# Patient Record
Sex: Male | Born: 1955 | Hispanic: No | Marital: Married | Smoking: Former smoker
Health system: Southern US, Community
[De-identification: ages and names within clinical notes are randomized; demographics above are authoritative.]

## PROBLEM LIST (undated history)

## (undated) DIAGNOSIS — Z992 Dependence on renal dialysis: Secondary | ICD-10-CM

## (undated) DIAGNOSIS — R0602 Shortness of breath: Secondary | ICD-10-CM

## (undated) DIAGNOSIS — K921 Melena: Secondary | ICD-10-CM

## (undated) DIAGNOSIS — N186 End stage renal disease: Secondary | ICD-10-CM

## (undated) DIAGNOSIS — A159 Respiratory tuberculosis unspecified: Secondary | ICD-10-CM

## (undated) DIAGNOSIS — R51 Headache: Secondary | ICD-10-CM

## (undated) DIAGNOSIS — I10 Essential (primary) hypertension: Secondary | ICD-10-CM

## (undated) DIAGNOSIS — J189 Pneumonia, unspecified organism: Secondary | ICD-10-CM

## (undated) DIAGNOSIS — S29012A Strain of muscle and tendon of back wall of thorax, initial encounter: Secondary | ICD-10-CM

## (undated) DIAGNOSIS — R634 Abnormal weight loss: Secondary | ICD-10-CM

## (undated) DIAGNOSIS — L84 Corns and callosities: Secondary | ICD-10-CM

## (undated) DIAGNOSIS — R319 Hematuria, unspecified: Secondary | ICD-10-CM

## (undated) DIAGNOSIS — E440 Moderate protein-calorie malnutrition: Secondary | ICD-10-CM

## (undated) DIAGNOSIS — D696 Thrombocytopenia, unspecified: Secondary | ICD-10-CM

## (undated) DIAGNOSIS — R519 Headache, unspecified: Secondary | ICD-10-CM

## (undated) DIAGNOSIS — Z9981 Dependence on supplemental oxygen: Secondary | ICD-10-CM

## (undated) DIAGNOSIS — M25511 Pain in right shoulder: Secondary | ICD-10-CM

## (undated) DIAGNOSIS — G459 Transient cerebral ischemic attack, unspecified: Secondary | ICD-10-CM

## (undated) DIAGNOSIS — I2510 Atherosclerotic heart disease of native coronary artery without angina pectoris: Secondary | ICD-10-CM

## (undated) DIAGNOSIS — N289 Disorder of kidney and ureter, unspecified: Secondary | ICD-10-CM

## (undated) DIAGNOSIS — R112 Nausea with vomiting, unspecified: Secondary | ICD-10-CM

## (undated) DIAGNOSIS — H4310 Vitreous hemorrhage, unspecified eye: Secondary | ICD-10-CM

## (undated) DIAGNOSIS — E785 Hyperlipidemia, unspecified: Secondary | ICD-10-CM

## (undated) DIAGNOSIS — K635 Polyp of colon: Secondary | ICD-10-CM

## (undated) DIAGNOSIS — D649 Anemia, unspecified: Secondary | ICD-10-CM

## (undated) DIAGNOSIS — R011 Cardiac murmur, unspecified: Secondary | ICD-10-CM

## (undated) DIAGNOSIS — R079 Chest pain, unspecified: Secondary | ICD-10-CM

## (undated) DIAGNOSIS — K219 Gastro-esophageal reflux disease without esophagitis: Secondary | ICD-10-CM

## (undated) DIAGNOSIS — R200 Anesthesia of skin: Secondary | ICD-10-CM

## (undated) DIAGNOSIS — Z972 Presence of dental prosthetic device (complete) (partial): Secondary | ICD-10-CM

## (undated) DIAGNOSIS — Z9289 Personal history of other medical treatment: Secondary | ICD-10-CM

## (undated) DIAGNOSIS — Z87442 Personal history of urinary calculi: Secondary | ICD-10-CM

## (undated) DIAGNOSIS — I5032 Chronic diastolic (congestive) heart failure: Secondary | ICD-10-CM

## (undated) DIAGNOSIS — E119 Type 2 diabetes mellitus without complications: Secondary | ICD-10-CM

## (undated) DIAGNOSIS — I209 Angina pectoris, unspecified: Secondary | ICD-10-CM

## (undated) HISTORY — DX: Chest pain, unspecified: R07.9

## (undated) HISTORY — DX: Corns and callosities: L84

## (undated) HISTORY — DX: Transient cerebral ischemic attack, unspecified: G45.9

## (undated) HISTORY — DX: Strain of muscle and tendon of back wall of thorax, initial encounter: S29.012A

## (undated) HISTORY — PX: AV FISTULA PLACEMENT: SHX1204

## (undated) HISTORY — DX: Pain in right shoulder: M25.511

## (undated) HISTORY — PX: CARDIAC CATHETERIZATION: SHX172

## (undated) HISTORY — DX: Nausea with vomiting, unspecified: R11.2

## (undated) HISTORY — DX: Moderate protein-calorie malnutrition: E44.0

## (undated) HISTORY — DX: Shortness of breath: R06.02

## (undated) HISTORY — PX: CORONARY ANGIOPLASTY WITH STENT PLACEMENT: SHX49

## (undated) HISTORY — PX: EYE SURGERY: SHX253

## (undated) HISTORY — DX: Abnormal weight loss: R63.4

## (undated) HISTORY — PX: CYSTOSCOPY W/ STONE MANIPULATION: SHX1427

## (undated) HISTORY — PX: LITHOTRIPSY: SUR834

## (undated) HISTORY — PX: MITRAL VALVE REPLACEMENT: SHX147

## (undated) HISTORY — DX: Vitreous hemorrhage, unspecified eye: H43.10

## (undated) HISTORY — DX: Thrombocytopenia, unspecified: D69.6

## (undated) HISTORY — PX: CORONARY ARTERY BYPASS GRAFT: SHX141

## (undated) HISTORY — DX: Anesthesia of skin: R20.0

---

## 2010-11-29 DIAGNOSIS — H4310 Vitreous hemorrhage, unspecified eye: Secondary | ICD-10-CM

## 2010-11-29 DIAGNOSIS — H269 Unspecified cataract: Secondary | ICD-10-CM | POA: Insufficient documentation

## 2010-11-29 HISTORY — DX: Vitreous hemorrhage, unspecified eye: H43.10

## 2012-04-15 DIAGNOSIS — M1710 Unilateral primary osteoarthritis, unspecified knee: Secondary | ICD-10-CM | POA: Insufficient documentation

## 2014-04-19 ENCOUNTER — Ambulatory Visit: Payer: Self-pay | Admitting: Podiatry

## 2014-04-26 ENCOUNTER — Emergency Department (HOSPITAL_COMMUNITY): Payer: Medicare Other

## 2014-04-26 ENCOUNTER — Observation Stay (HOSPITAL_COMMUNITY)
Admission: EM | Admit: 2014-04-26 | Discharge: 2014-04-27 | Disposition: A | Discharge status: Home / Self Care | Payer: Medicare Other | Attending: Internal Medicine | Admitting: Internal Medicine

## 2014-04-26 ENCOUNTER — Encounter (HOSPITAL_COMMUNITY): Payer: Self-pay | Admitting: Emergency Medicine

## 2014-04-26 DIAGNOSIS — E1165 Type 2 diabetes mellitus with hyperglycemia: Secondary | ICD-10-CM

## 2014-04-26 DIAGNOSIS — Z992 Dependence on renal dialysis: Secondary | ICD-10-CM | POA: Insufficient documentation

## 2014-04-26 DIAGNOSIS — I10 Essential (primary) hypertension: Secondary | ICD-10-CM | POA: Diagnosis present

## 2014-04-26 DIAGNOSIS — Z888 Allergy status to other drugs, medicaments and biological substances status: Secondary | ICD-10-CM | POA: Insufficient documentation

## 2014-04-26 DIAGNOSIS — I120 Hypertensive chronic kidney disease with stage 5 chronic kidney disease or end stage renal disease: Secondary | ICD-10-CM | POA: Diagnosis not present

## 2014-04-26 DIAGNOSIS — R0789 Other chest pain: Secondary | ICD-10-CM | POA: Diagnosis not present

## 2014-04-26 DIAGNOSIS — Z9861 Coronary angioplasty status: Secondary | ICD-10-CM

## 2014-04-26 DIAGNOSIS — Z8639 Personal history of other endocrine, nutritional and metabolic disease: Secondary | ICD-10-CM | POA: Diagnosis present

## 2014-04-26 DIAGNOSIS — Z79899 Other long term (current) drug therapy: Secondary | ICD-10-CM | POA: Insufficient documentation

## 2014-04-26 DIAGNOSIS — N186 End stage renal disease: Secondary | ICD-10-CM | POA: Insufficient documentation

## 2014-04-26 DIAGNOSIS — Z7902 Long term (current) use of antithrombotics/antiplatelets: Secondary | ICD-10-CM | POA: Insufficient documentation

## 2014-04-26 DIAGNOSIS — Z7982 Long term (current) use of aspirin: Secondary | ICD-10-CM | POA: Insufficient documentation

## 2014-04-26 DIAGNOSIS — IMO0001 Reserved for inherently not codable concepts without codable children: Secondary | ICD-10-CM

## 2014-04-26 DIAGNOSIS — Z794 Long term (current) use of insulin: Secondary | ICD-10-CM | POA: Insufficient documentation

## 2014-04-26 DIAGNOSIS — R079 Chest pain, unspecified: Secondary | ICD-10-CM | POA: Insufficient documentation

## 2014-04-26 DIAGNOSIS — I2510 Atherosclerotic heart disease of native coronary artery without angina pectoris: Secondary | ICD-10-CM | POA: Diagnosis present

## 2014-04-26 HISTORY — DX: Essential (primary) hypertension: I10

## 2014-04-26 LAB — CBC
HCT: 35.8 % — ABNORMAL LOW (ref 39.0–52.0)
Hemoglobin: 11.4 g/dL — ABNORMAL LOW (ref 13.0–17.0)
MCH: 26.8 pg (ref 26.0–34.0)
MCHC: 31.8 g/dL (ref 30.0–36.0)
MCV: 84.2 fL (ref 78.0–100.0)
Platelets: 178 10*3/uL (ref 150–400)
RBC: 4.25 MIL/uL (ref 4.22–5.81)
RDW: 16.7 % — AB (ref 11.5–15.5)
WBC: 6.9 10*3/uL (ref 4.0–10.5)

## 2014-04-26 LAB — BASIC METABOLIC PANEL
Anion gap: 17 — ABNORMAL HIGH (ref 5–15)
BUN: 17 mg/dL (ref 6–23)
CO2: 27 mEq/L (ref 19–32)
CREATININE: 6.94 mg/dL — AB (ref 0.50–1.35)
Calcium: 9.9 mg/dL (ref 8.4–10.5)
Chloride: 94 mEq/L — ABNORMAL LOW (ref 96–112)
GFR, EST AFRICAN AMERICAN: 9 mL/min — AB (ref >90)
GFR, EST NON AFRICAN AMERICAN: 8 mL/min — AB (ref >90)
GLUCOSE: 202 mg/dL — AB (ref 70–99)
POTASSIUM: 3.9 meq/L (ref 3.7–5.3)
Sodium: 138 mEq/L (ref 137–147)

## 2014-04-26 LAB — I-STAT TROPONIN, ED: Troponin i, poc: 0.01 ng/mL (ref 0.00–0.08)

## 2014-04-26 MED ORDER — VENLAFAXINE HCL 37.5 MG PO TABS
37.5000 mg | ORAL_TABLET | Freq: Two times a day (BID) | ORAL | Status: DC
  Administered 2014-04-27 (×2): 37.5 mg via ORAL
  Filled 2014-04-26 (×3): qty 1

## 2014-04-26 MED ORDER — INSULIN ASPART 100 UNIT/ML ~~LOC~~ SOLN: 15.0000 [IU] | Freq: Three times a day (TID) | SUBCUTANEOUS | Status: DC

## 2014-04-26 MED ORDER — PREGABALIN 50 MG PO CAPS
100.0000 mg | ORAL_CAPSULE | Freq: Three times a day (TID) | ORAL | Status: DC
  Administered 2014-04-27 (×2): 100 mg via ORAL
  Filled 2014-04-26 (×2): qty 2

## 2014-04-26 MED ORDER — NITROGLYCERIN 0.4 MG SL SUBL: 0.4000 mg | SUBLINGUAL_TABLET | SUBLINGUAL | Status: DC | PRN

## 2014-04-26 MED ORDER — SEVELAMER CARBONATE 800 MG PO TABS
1600.0000 mg | ORAL_TABLET | Freq: Every day | ORAL | Status: DC
  Filled 2014-04-26: qty 2

## 2014-04-26 MED ORDER — CINACALCET HCL 30 MG PO TABS
60.0000 mg | ORAL_TABLET | Freq: Every day | ORAL | Status: DC
  Administered 2014-04-27: 60 mg via ORAL
  Filled 2014-04-26 (×2): qty 2

## 2014-04-26 MED ORDER — AMLODIPINE BESYLATE 10 MG PO TABS
10.0000 mg | ORAL_TABLET | Freq: Every day | ORAL | Status: DC
  Administered 2014-04-27: 10 mg via ORAL
  Filled 2014-04-26: qty 1

## 2014-04-26 MED ORDER — MORPHINE SULFATE 4 MG/ML IJ SOLN
4.0000 mg | Freq: Once | INTRAMUSCULAR | Status: AC
  Administered 2014-04-26: 4 mg via INTRAVENOUS
  Filled 2014-04-26: qty 1

## 2014-04-26 MED ORDER — INSULIN ASPART 100 UNIT/ML ~~LOC~~ SOLN: 0.0000 [IU] | Freq: Three times a day (TID) | SUBCUTANEOUS | Status: DC

## 2014-04-26 MED ORDER — CARVEDILOL 25 MG PO TABS
50.0000 mg | ORAL_TABLET | Freq: Two times a day (BID) | ORAL | Status: DC
  Administered 2014-04-27: 50 mg via ORAL
  Filled 2014-04-26 (×3): qty 2

## 2014-04-26 MED ORDER — GI COCKTAIL ~~LOC~~: 30.0000 mL | Freq: Four times a day (QID) | ORAL | Status: DC | PRN

## 2014-04-26 MED ORDER — NITROGLYCERIN 0.4 MG SL SUBL
0.4000 mg | SUBLINGUAL_TABLET | SUBLINGUAL | Status: DC | PRN
  Administered 2014-04-26 (×2): 0.4 mg via SUBLINGUAL

## 2014-04-26 MED ORDER — NITROGLYCERIN 2 % TD OINT
0.5000 [in_us] | TOPICAL_OINTMENT | Freq: Four times a day (QID) | TRANSDERMAL | Status: DC
  Administered 2014-04-27 (×2): 0.5 [in_us] via TOPICAL
  Filled 2014-04-26: qty 30

## 2014-04-26 MED ORDER — IPRATROPIUM-ALBUTEROL 0.5-2.5 (3) MG/3ML IN SOLN: 3.0000 mL | Freq: Four times a day (QID) | RESPIRATORY_TRACT | Status: DC | PRN

## 2014-04-26 MED ORDER — INSULIN GLARGINE 100 UNIT/ML ~~LOC~~ SOLN
25.0000 [IU] | Freq: Every day | SUBCUTANEOUS | Status: DC
  Filled 2014-04-26: qty 0.25

## 2014-04-26 MED ORDER — MORPHINE SULFATE 2 MG/ML IJ SOLN: 2.0000 mg | INTRAMUSCULAR | Status: DC | PRN

## 2014-04-26 MED ORDER — INSULIN ASPART 100 UNIT/ML ~~LOC~~ SOLN: 0.0000 [IU] | Freq: Every day | SUBCUTANEOUS | Status: DC

## 2014-04-26 MED ORDER — CLOPIDOGREL BISULFATE 75 MG PO TABS
75.0000 mg | ORAL_TABLET | Freq: Every day | ORAL | Status: DC
  Administered 2014-04-27: 75 mg via ORAL
  Filled 2014-04-26: qty 1

## 2014-04-26 MED ORDER — ATORVASTATIN CALCIUM 40 MG PO TABS
40.0000 mg | ORAL_TABLET | Freq: Every day | ORAL | Status: DC
  Administered 2014-04-27: 40 mg via ORAL
  Filled 2014-04-26: qty 1

## 2014-04-26 MED ORDER — ASPIRIN EC 81 MG PO TBEC
81.0000 mg | DELAYED_RELEASE_TABLET | Freq: Every day | ORAL | Status: DC
  Administered 2014-04-27: 81 mg via ORAL
  Filled 2014-04-26: qty 1

## 2014-04-26 MED ORDER — ACETAMINOPHEN 325 MG PO TABS: 650.0000 mg | ORAL_TABLET | ORAL | Status: DC | PRN

## 2014-04-26 MED ORDER — HEPARIN SODIUM (PORCINE) 5000 UNIT/ML IJ SOLN
5000.0000 [IU] | Freq: Three times a day (TID) | INTRAMUSCULAR | Status: DC
  Administered 2014-04-27: 5000 [IU] via SUBCUTANEOUS
  Filled 2014-04-26 (×4): qty 1

## 2014-04-26 MED ORDER — AMITRIPTYLINE HCL 100 MG PO TABS
100.0000 mg | ORAL_TABLET | Freq: Every day | ORAL | Status: DC
  Administered 2014-04-27: 75 mg via ORAL
  Filled 2014-04-26 (×2): qty 1

## 2014-04-26 MED ORDER — ONDANSETRON HCL 4 MG/2ML IJ SOLN
4.0000 mg | Freq: Four times a day (QID) | INTRAMUSCULAR | Status: DC | PRN
Start: 2014-04-26 — End: 2014-04-27

## 2014-04-26 MED ORDER — SEVELAMER CARBONATE 800 MG PO TABS
2400.0000 mg | ORAL_TABLET | Freq: Two times a day (BID) | ORAL | Status: DC
  Filled 2014-04-26 (×3): qty 3

## 2014-04-26 NOTE — ED Notes (Signed)
Admitting physician at bedside

## 2014-04-26 NOTE — ED Notes (Signed)
Pt placed in gown and in bed. Pt monitored by 12-lead, pulse ox, and bp cuff.

## 2014-04-26 NOTE — ED Provider Notes (Signed)
CSN: [id number]     Arrival date & time 04/26/14  1816 History   First MD Initiated Contact with Patient 04/26/14 2112     Chief Complaint  Patient presents with  . Chest Pain     (Consider location/radiation/quality/duration/timing/severity/associated sxs/prior Treatment) Patient is a 58 y.o. male presenting with chest pain. The history is provided by the patient.  Chest Pain Pain location:  Substernal area Pain quality: sharp   Pain quality: not aching   Pain radiates to:  R arm Pain radiates to the back: no   Pain severity:  Moderate Onset quality:  Sudden Timing:  Constant Progression:  Waxing and waning Chronicity:  Recurrent Context: at rest (while resting at dialysis)   Relieved by:  Nothing Worsened by:  Nothing tried Associated symptoms: no abdominal pain, no cough, no fever, no shortness of breath and not vomiting     Past Medical History  Diagnosis Date  . Hypertension   . Renal disorder    History reviewed. No pertinent past surgical history. History reviewed. No pertinent family history. History  Substance Use Topics  . Smoking status: Never Smoker   . Smokeless tobacco: Not on file  . Alcohol Use: No    Review of Systems  Constitutional: Negative for fever.  Respiratory: Negative for cough and shortness of breath.   Cardiovascular: Negative for chest pain and leg swelling.  Gastrointestinal: Negative for vomiting and abdominal pain.  All other systems reviewed and are negative.     Allergies  Enalapril  Home Medications   Prior to Admission medications   Medication Sig Start Date End Date Taking? Authorizing Provider  amitriptyline (ELAVIL) 100 MG tablet Take 100 mg by mouth at bedtime.   Yes Historical Provider, MD  amLODipine (NORVASC) 10 MG tablet Take 10 mg by mouth daily.   Yes Historical Provider, MD  aspirin EC 81 MG tablet Take 81 mg by mouth daily.   Yes Historical Provider, MD  atorvastatin (LIPITOR) 40 MG tablet Take 40 mg by  mouth daily.   Yes Historical Provider, MD  carvedilol (COREG) 25 MG tablet Take 50 mg by mouth 2 (two) times daily with a meal.   Yes Historical Provider, MD  cinacalcet (SENSIPAR) 30 MG tablet Take 60 mg by mouth at bedtime.   Yes Historical Provider, MD  clopidogrel (PLAVIX) 75 MG tablet Take 75 mg by mouth daily.   Yes Historical Provider, MD  insulin aspart (NOVOLOG) 100 UNIT/ML injection Inject 15-20 Units into the skin 3 (three) times daily before meals.   Yes Historical Provider, MD  insulin glargine (LANTUS) 100 UNIT/ML injection Inject 25 Units into the skin at bedtime.   Yes Historical Provider, MD  Ipratropium-Albuterol (COMBIVENT RESPIMAT) 20-100 MCG/ACT AERS respimat Inhale 2 puffs into the lungs every 6 (six) hours as needed for wheezing.   Yes Historical Provider, MD  nitroGLYCERIN (NITROSTAT) 0.4 MG SL tablet Place 0.4 mg under the tongue every 5 (five) minutes as needed for chest pain.   Yes Historical Provider, MD  pregabalin (LYRICA) 100 MG capsule Take 100 mg by mouth 3 (three) times daily.   Yes Historical Provider, MD  sevelamer carbonate (RENVELA) 800 MG tablet Take 1,600-2,400 mg by mouth 3 (three) times daily with meals. *takes 2400mg  in the morning, 1600mg  midday, and 2400mg  at night*   Yes Historical Provider, MD  venlafaxine (EFFEXOR) 37.5 MG tablet Take 37.5 mg by mouth 2 (two) times daily.   Yes Historical Provider, MD   BP 143/75  Pulse  91  Temp(Src) 99.5 F (37.5 C) (Oral)  Resp 24  SpO2 97% Physical Exam  Nursing note and vitals reviewed. Constitutional: He is oriented to person, place, and time. He appears well-developed and well-nourished. No distress.  HENT:  Head: Normocephalic and atraumatic.  Mouth/Throat: No oropharyngeal exudate.  Eyes: EOM are normal. Pupils are equal, round, and reactive to light.  Neck: Normal range of motion. Neck supple.  Cardiovascular: Normal rate and regular rhythm.  Exam reveals no friction rub.   No murmur  heard. Pulmonary/Chest: Effort normal and breath sounds normal. No respiratory distress. He has no wheezes. He has no rales.  Abdominal: He exhibits no distension. There is no tenderness. There is no rebound.  Musculoskeletal: Normal range of motion. He exhibits no edema.  Neurological: He is alert and oriented to person, place, and time.  Skin: He is not diaphoretic.    ED Course  Procedures (including critical care time) Labs Review Labs Reviewed  CBC - Abnormal; Notable for the following:    Hemoglobin 11.4 (*)    HCT 35.8 (*)    RDW 16.7 (*)    All other components within normal limits  BASIC METABOLIC PANEL - Abnormal; Notable for the following:    Chloride 94 (*)    Glucose, Bld 202 (*)    Creatinine, Ser 6.94 (*)    GFR calc non Af Amer 8 (*)    GFR calc Af Amer 9 (*)    Anion gap 17 (*)    All other components within normal limits  I-STAT TROPOININ, ED    Imaging Review Dg Chest 2 View  04/26/2014   CLINICAL DATA:  58 year old male with shortness of breath and chest pain status post dialysis. Initial encounter.  EXAM: CHEST  2 VIEW  COMPARISON:  None.  FINDINGS: Low lung volumes. Increased interstitial opacity. No pleural effusion. No consolidation. No pneumothorax. Mild cardiomegaly. Other mediastinal contours are within normal limits. Visualized tracheal air column is within normal limits. No acute osseous abnormality identified.  IMPRESSION: Low lung volumes with increased interstitial opacity, favor interstitial edema in this setting. Viral / atypical respiratory infection is the main differential consideration.   Electronically Signed   By: Lars Pinks M.D.   On: 04/26/2014 19:37     EKG Interpretation   Date/Time:  Monday April 26 2014 18:20:40 EDT Ventricular Rate:  104 PR Interval:  150 QRS Duration: 98 QT Interval:  348 QTC Calculation: 457 R Axis:   62 Text Interpretation:  Sinus tachycardia Otherwise normal ECG No prior  Confirmed by Mingo Amber  MD, Cherylene Ferrufino  ([id number]) on 04/26/2014 9:13:38 PM      MDM   Final diagnoses:  Chest pain, atypical    26M presents with sharp mid-sternal CP today, began during dialysis. Hx of cardiac stents, placed 3 years ago at [facility]. No fevers, SOB, dizziness, N/V/D. Had full dialysis treatment today.  EKG ok, no prior for comparison. Labs ok. Pain 2/10, sharp, atypical. I spoke with Cards, recommended hospitalist admission.     Osvaldo Shipper, MD 04/26/14 [phone]

## 2014-04-26 NOTE — H&P (Signed)
[facility] History and Physical  Patient: [name] [name]  [id number]  DOB: 1955-12-05  DOS: the patient was seen and examined on 04/26/2014 PCP: [name]  Chief Complaint: Chest pain  HPI: [name] [name] is a 58 y.o. male with Past medical history of coronary artery disease, hypertension, ESRD on hemodialysis, type 2 diabetes mellitus, mood disorder. Patient presented with complaints of chest pain. He mentions that when he went for the dialysis he was asymptomatic. A he was started on dialysis he started having complaints of pain which was substernal sharp shooting radiating to his right side associated with shortness of breath and nausea. Also he had some diaphoresis. After the dialysis the pain resolved. He presented to the ER with chest pain-free initially but then he had recurrence of his chest pain. Pain resolved with nitroglycerin and morphine. He mentions this pain is more severe than the pain that led to his right coronary artery angiography. He denies any dizziness or lightheadedness. He mentions he is compliant with all his medications. He continues to have happened with his dialysis. He denies any fever chills cough nausea vomiting abdominal pain diarrhea constipation the time of my evaluation. He used to followup with [facility] and had his prior coronary artery workup in his cardiologist office at [facility] Currently he has not seen any cardiologist since last one to 2 years.  The patient is coming from home. And at his baseline independent for most of his ADL.  Review of Systems: as mentioned in the history of present illness.  A Comprehensive review of the other systems is negative.  Past Medical History  Diagnosis Date  . Hypertension   . Renal disorder    History reviewed. No pertinent past surgical history. Social History:  reports that he has never smoked. He does not have any smokeless tobacco history on file. He reports that he does not drink alcohol or use  illicit drugs.  Allergies  Allergen Reactions  . Enalapril Hives    History reviewed. No pertinent family history.  Prior to Admission medications   Medication Sig Start Date End Date Taking? Authorizing Provider  amitriptyline (ELAVIL) 100 MG tablet Take 100 mg by mouth at bedtime.   Yes Historical Provider, MD  amLODipine (NORVASC) 10 MG tablet Take 10 mg by mouth daily.   Yes Historical Provider, MD  aspirin EC 81 MG tablet Take 81 mg by mouth daily.   Yes Historical Provider, MD  atorvastatin (LIPITOR) 40 MG tablet Take 40 mg by mouth daily.   Yes Historical Provider, MD  carvedilol (COREG) 25 MG tablet Take 50 mg by mouth 2 (two) times daily with a meal.   Yes Historical Provider, MD  cinacalcet (SENSIPAR) 30 MG tablet Take 60 mg by mouth at bedtime.   Yes Historical Provider, MD  clopidogrel (PLAVIX) 75 MG tablet Take 75 mg by mouth daily.   Yes Historical Provider, MD  insulin aspart (NOVOLOG) 100 UNIT/ML injection Inject 15-20 Units into the skin 3 (three) times daily before meals.   Yes Historical Provider, MD  insulin glargine (LANTUS) 100 UNIT/ML injection Inject 25 Units into the skin at bedtime.   Yes Historical Provider, MD  Ipratropium-Albuterol (COMBIVENT RESPIMAT) 20-100 MCG/ACT AERS respimat Inhale 2 puffs into the lungs every 6 (six) hours as needed for wheezing.   Yes Historical Provider, MD  nitroGLYCERIN (NITROSTAT) 0.4 MG SL tablet Place 0.4 mg under the tongue every 5 (five) minutes as needed for chest pain.   Yes Historical Provider, MD  pregabalin (LYRICA) 100 MG capsule Take 100 mg by mouth 3 (three) times daily.   Yes Historical Provider, MD  sevelamer carbonate (RENVELA) 800 MG tablet Take 1,600-2,400 mg by mouth 3 (three) times daily with meals. *takes 2400mg  in the morning, 1600mg  midday, and 2400mg  at night*   Yes Historical Provider, MD  venlafaxine (EFFEXOR) 37.5 MG tablet Take 37.5 mg by mouth 2 (two) times daily.   Yes Historical Provider, MD    Physical  Exam: Filed Vitals:   04/26/14 2200 04/26/14 2215 04/26/14 2230 04/26/14 2245  BP: 127/87 143/78 144/86 143/75  Pulse: 87 83 82 91  Temp:      TempSrc:      Resp: 20 16 15 24   SpO2: 90% 94% 95% 97%    General: Alert, Awake and Oriented to Time, Place and Person. Appear in mild distress Eyes: PERRL ENT: Oral Mucosa clear moist. Neck: no JVD Cardiovascular: S1 and S2 Present, no Murmur, Peripheral Pulses Present Respiratory: Bilateral Air entry equal and Decreased, Clear to Auscultation, noCrackles, no wheezes Abdomen: Bowel Sound Present, Soft and Non tender Skin: no Rash Extremities: Trace Pedal edema, no calf tenderness Neurologic: Grossly no focal neuro deficit.  Labs on Admission:  CBC:  Recent Labs Lab 04/26/14 1836  WBC 6.9  HGB 11.4*  HCT 35.8*  MCV 84.2  PLT 178    CMP     Component Value Date/Time   NA 138 04/26/2014 1836   K 3.9 04/26/2014 1836   CL 94* 04/26/2014 1836   CO2 27 04/26/2014 1836   GLUCOSE 202* 04/26/2014 1836   BUN 17 04/26/2014 1836   CREATININE 6.94* 04/26/2014 1836   CALCIUM 9.9 04/26/2014 1836   GFRNONAA 8* 04/26/2014 1836   GFRAA 9* 04/26/2014 1836    No results found for this basename: LIPASE, AMYLASE,  in the last 168 hours No results found for this basename: AMMONIA,  in the last 168 hours  No results found for this basename: CKTOTAL, CKMB, CKMBINDEX, TROPONINI,  in the last 168 hours BNP (last 3 results) No results found for this basename: PROBNP,  in the last 8760 hours  Radiological Exams on Admission: Dg Chest 2 View  04/26/2014   CLINICAL DATA:  58 year old male with shortness of breath and chest pain status post dialysis. Initial encounter.  EXAM: CHEST  2 VIEW  COMPARISON:  None.  FINDINGS: Low lung volumes. Increased interstitial opacity. No pleural effusion. No consolidation. No pneumothorax. Mild cardiomegaly. Other mediastinal contours are within normal limits. Visualized tracheal air column is within normal limits. No acute  osseous abnormality identified.  IMPRESSION: Low lung volumes with increased interstitial opacity, favor interstitial edema in this setting. Viral / atypical respiratory infection is the main differential consideration.   Electronically Signed   By: Lars Pinks M.D.   On: 04/26/2014 19:37    EKG: Independently reviewed. normal sinus rhythm, nonspecific ST and T waves changes. Assessment/Plan Principal Problem:   Chest pain Active Problems:   ESRD (end stage renal disease)   1. Chest pain Patient presents with complaints of chest pain. He has history of hypertension, ESRD on hemodialysis, diabetes mellitus, coronary artery disease. He mentions this pain is different but associated with similar symptoms like this prior heart attacks. At present will be admitted to the hospital. We will obtain serial troponins. On limited echo program in the morning. Patient may require cardiology consultation. Patient may get n.p.o. after midnight. Continue aspirin and Plavix.  2. Diabetes mellitus. Continue his home medication  as well as place him on a sliding scale.  3. Mood disorder. Continue amitriptyline venlafaxine.  4. ESRD on hemodialysis. Patient has Monday hemodialysis. [facility]  consult nephrology if patient remains in the hospital on Wednesday.  DVT Prophylaxis: subcutaneous Heparin Nutrition: N.p.o. after midnight  Code Status: Full  Disposition: Admitted to observation in telemetry unit.  Author: Berle Mull, MD [facility] Pager: [phone] 04/26/2014, 10:53 PM    If 7PM-7AM, please contact night-coverage [email or web address] Password [email or web address]  **Disclaimer: This note may have been dictated with voice recognition software. Similar sounding words can inadvertently be transcribed and this note may contain transcription errors which may not have been corrected upon publication of note.**

## 2014-04-26 NOTE — ED Notes (Signed)
Pt c/o mid sternal CP starting today during dialysis when moved in certain motion; pt denies SOB

## 2014-04-26 NOTE — ED Notes (Signed)
Patient dropped O2 sats to 88% on RA  This RN placed patient on 2L O2 via [place]. O2 sats now 95%.

## 2014-04-27 ENCOUNTER — Observation Stay (HOSPITAL_COMMUNITY): Payer: Medicare Other

## 2014-04-27 ENCOUNTER — Other Ambulatory Visit (HOSPITAL_COMMUNITY): Payer: Medicare Other

## 2014-04-27 DIAGNOSIS — R0789 Other chest pain: Secondary | ICD-10-CM

## 2014-04-27 DIAGNOSIS — Z9861 Coronary angioplasty status: Secondary | ICD-10-CM

## 2014-04-27 DIAGNOSIS — I10 Essential (primary) hypertension: Secondary | ICD-10-CM

## 2014-04-27 DIAGNOSIS — R079 Chest pain, unspecified: Secondary | ICD-10-CM

## 2014-04-27 DIAGNOSIS — I2510 Atherosclerotic heart disease of native coronary artery without angina pectoris: Secondary | ICD-10-CM | POA: Diagnosis present

## 2014-04-27 DIAGNOSIS — Z8639 Personal history of other endocrine, nutritional and metabolic disease: Secondary | ICD-10-CM | POA: Diagnosis present

## 2014-04-27 DIAGNOSIS — I059 Rheumatic mitral valve disease, unspecified: Secondary | ICD-10-CM

## 2014-04-27 DIAGNOSIS — N186 End stage renal disease: Secondary | ICD-10-CM

## 2014-04-27 LAB — COMPREHENSIVE METABOLIC PANEL
ALBUMIN: 3.3 g/dL — AB (ref 3.5–5.2)
ALT: <5 U/L (ref 0–53)
AST: <5 U/L (ref 0–37)
Alkaline Phosphatase: 63 U/L (ref 39–117)
Anion gap: 17 — ABNORMAL HIGH (ref 5–15)
BUN: 21 mg/dL (ref 6–23)
CALCIUM: 9.1 mg/dL (ref 8.4–10.5)
CO2: 29 mEq/L (ref 19–32)
CREATININE: 8.1 mg/dL — AB (ref 0.50–1.35)
Chloride: 94 mEq/L — ABNORMAL LOW (ref 96–112)
GFR calc Af Amer: 8 mL/min — ABNORMAL LOW (ref >90)
GFR calc non Af Amer: 7 mL/min — ABNORMAL LOW (ref >90)
Glucose, Bld: 160 mg/dL — ABNORMAL HIGH (ref 70–99)
Potassium: 4 mEq/L (ref 3.7–5.3)
Sodium: 140 mEq/L (ref 137–147)
Total Bilirubin: 0.3 mg/dL (ref 0.3–1.2)
Total Protein: 7.4 g/dL (ref 6.0–8.3)

## 2014-04-27 LAB — CBC WITH DIFFERENTIAL/PLATELET
Basophils Absolute: 0 10*3/uL (ref 0.0–0.1)
Basophils Relative: 0 % (ref 0–1)
Eosinophils Absolute: 0.3 10*3/uL (ref 0.0–0.7)
Eosinophils Relative: 6 % — ABNORMAL HIGH (ref 0–5)
HCT: 35.6 % — ABNORMAL LOW (ref 39.0–52.0)
HEMOGLOBIN: 10.9 g/dL — AB (ref 13.0–17.0)
Lymphocytes Relative: 27 % (ref 12–46)
Lymphs Abs: 1.4 10*3/uL (ref 0.7–4.0)
MCH: 26.6 pg (ref 26.0–34.0)
MCHC: 30.6 g/dL (ref 30.0–36.0)
MCV: 86.8 fL (ref 78.0–100.0)
MONO ABS: 0.5 10*3/uL (ref 0.1–1.0)
MONOS PCT: 11 % (ref 3–12)
Neutro Abs: 2.7 10*3/uL (ref 1.7–7.7)
Neutrophils Relative %: 56 % (ref 43–77)
Platelets: 156 10*3/uL (ref 150–400)
RBC: 4.1 MIL/uL — ABNORMAL LOW (ref 4.22–5.81)
RDW: 16.9 % — ABNORMAL HIGH (ref 11.5–15.5)
WBC: 4.9 10*3/uL (ref 4.0–10.5)

## 2014-04-27 LAB — GLUCOSE, CAPILLARY
GLUCOSE-CAPILLARY: 136 mg/dL — AB (ref 70–99)
GLUCOSE-CAPILLARY: 116 mg/dL — AB (ref 70–99)
Glucose-Capillary: 83 mg/dL (ref 70–99)

## 2014-04-27 LAB — TROPONIN I: Troponin I: <0.3 ng/mL (ref <0.30)

## 2014-04-27 LAB — MRSA PCR SCREENING: MRSA by PCR: NEGATIVE

## 2014-04-27 LAB — PROTIME-INR
INR: 1.09 (ref 0.00–1.49)
PROTHROMBIN TIME: 14.1 s (ref 11.6–15.2)

## 2014-04-27 LAB — D-DIMER, QUANTITATIVE: D-Dimer, Quant: <0.27 ug/mL-FEU (ref 0.00–0.48)

## 2014-04-27 MED ORDER — TECHNETIUM TC 99M SESTAMIBI GENERIC - CARDIOLITE
30.0000 | Freq: Once | INTRAVENOUS | Status: AC | PRN
  Administered 2014-04-27: 30 via INTRAVENOUS

## 2014-04-27 MED ORDER — TECHNETIUM TC 99M SESTAMIBI GENERIC - CARDIOLITE
10.0000 | Freq: Once | INTRAVENOUS | Status: AC | PRN
  Administered 2014-04-27: 10 via INTRAVENOUS

## 2014-04-27 MED ORDER — REGADENOSON 0.4 MG/5ML IV SOLN
INTRAVENOUS | Status: AC
  Administered 2014-04-27: 0.4 mg via INTRAVENOUS
  Filled 2014-04-27: qty 5

## 2014-04-27 MED ORDER — REGADENOSON 0.4 MG/5ML IV SOLN
0.4000 mg | Freq: Once | INTRAVENOUS | Status: AC
  Administered 2014-04-27: 0.4 mg via INTRAVENOUS

## 2014-04-27 NOTE — Progress Notes (Signed)
Lexiscan CL performed 

## 2014-04-27 NOTE — Progress Notes (Signed)
Chest pain rounds:  This 58 year old gentleman was admitted with chest pain.  He has end-stage renal disease and is on dialysis.  He has type 2 diabetes.  He has a past history of coronary artery disease.  He has been followed at [facility].  He thinks that he has 2 stents in his heart.  He states that his last stress test was several years ago.  He was admitted yesterday after developing midsternal chest discomfort during dialysis.  He states that he tried sublingual nitroglycerin without much effect.  This morning he is pain free.  His EKG last evening shows normal sinus rhythm and no ischemic changes.  His troponins are negative x2 On physical examination his lungs are clear.  The heart reveals a soft apical holosystolic murmur.  There is no chest wall tenderness.  Extremities reveal no phlebitis or edema. Plan: Will evaluate for ischemic heart disease with Lexi scan Myoview stress test today.  He will also get an echocardiogram.  Possible discharge later today if stress test is normal.

## 2014-04-27 NOTE — Care Management Note (Signed)
    Page 1 of 1   04/27/2014     10:28:52 AM CARE MANAGEMENT NOTE 04/27/2014  Patient:  [name] [name],[name] [name]   Account Number:  [id number]  Date Initiated:  04/27/2014  Documentation initiated by:  GRAVES-BIGELOW,Semaja Lymon  Subjective/Objective Assessment:   Pt admitted for chest pain. Plan for stress test today. Plan for d/c if stress test normal.     Action/Plan:   CM did provide pt with the [facility] Number for PCP. No further needs from CM at this time.   Anticipated DC Date:  04/27/2014   Anticipated DC Plan:  [place]  CM consult      Choice offered to / List presented to:             Status of service:  Completed, signed off Medicare Important Message given?  NO (If response is "NO", the following Medicare IM given date fields will be blank) Date Medicare IM given:   Medicare IM given by:   Date Additional Medicare IM given:   Additional Medicare IM given by:    Discharge Disposition:  HOME/SELF CARE  Per UR Regulation:  Reviewed for med. necessity/level of care/duration of stay  If discussed at [place] of Stay Meetings, dates discussed:    Comments:

## 2014-04-27 NOTE — Progress Notes (Signed)
  Echocardiogram 2D Echocardiogram has been performed.  Darlina Sicilian M 04/27/2014, 11:31 AM

## 2014-04-27 NOTE — Progress Notes (Signed)
Patient ID: [name] [name]  male  [id number]    DOB: Jul 27, 1956    DOA: 04/26/2014  PCP: [name]  Assessment/Plan: Principal Problem:   Chest pain with history of coronary artery disease, patient reports 2 stents and no stress test was several years ago. He was followed by cardiology at [facility], he relocated to [facility] and has not followed with any cardiologist here. - Ruled out for acute ACS, EKG showed normal sinus rhythm and no ischemic changes - Cardiology consulted and recommended Myoview stress test and echocardiogram for further workup - Continue aspirin, Coreg, Plavix, statin  Active Problems:   ESRD (end stage renal disease) on HD, MWF - Patient has received hemodialysis yesterday    Essential hypertension, benign -Currently stable, continue Norvasc, Coreg    Type II or unspecified type diabetes mellitus without mention of complication, uncontrolled - Continue sliding scale insulin  History of depression : Continue Effexor   DVT Prophylaxis:Heparin subcutaneous  Code Status:Full code  Family Communication:Discussed with patient  Disposition:Hopefully today  Consultants:  Cardiology  Procedures:  Nuclear medicine stress test, echo  Antibiotics:  None    Subjective: Patient seen and examined, states still having slight chest pain 1/10, "sore", no shortness of breath, nausea, palpitations or diaphoresis  Objective: Weight change:  No intake or output data in the 24 hours ending 04/27/14 1019 Blood pressure 167/96, pulse 95, temperature 98 F (36.7 C), temperature source Oral, resp. rate 21, height 6' (1.829 m), weight 109.861 kg (242 lb 3.2 oz), SpO2 97.00%.  Physical Exam: General: Alert and awake, oriented x3, not in any acute distress. CVS: S1-S2 clear, no murmur rubs or gallops Chest: clear to auscultation bilaterally, no wheezing, rales or rhonchi Abdomen: soft nontender, nondistended, normal bowel sounds  Extremities: no  cyanosis, clubbing or edema noted bilaterally Neuro: Cranial nerves II-XII intact, no focal neurological deficits  Lab Results: Basic Metabolic Panel:  Recent Labs Lab 04/26/14 1836 04/27/14 0320  NA 138 140  K 3.9 4.0  CL 94* 94*  CO2 27 29  GLUCOSE 202* 160*  BUN 17 21  CREATININE 6.94* 8.10*  CALCIUM 9.9 9.1   Liver Function Tests:  Recent Labs Lab 04/27/14 0320  AST <5  ALT <5  ALKPHOS 63  BILITOT 0.3  PROT 7.4  ALBUMIN 3.3*   No results found for this basename: LIPASE, AMYLASE,  in the last 168 hours No results found for this basename: AMMONIA,  in the last 168 hours CBC:  Recent Labs Lab 04/26/14 1836 04/27/14 0320  WBC 6.9 4.9  NEUTROABS  --  2.7  HGB 11.4* 10.9*  HCT 35.8* 35.6*  MCV 84.2 86.8  PLT 178 156   Cardiac Enzymes:  Recent Labs Lab 04/27/14 0025 04/27/14 0320  TROPONINI <0.30 <0.30   BNP: No components found with this basename: POCBNP,  CBG:  Recent Labs Lab 04/27/14 0011 04/27/14 0754  GLUCAP 83 136*     Micro Results: Recent Results (from the past 240 hour(s))  MRSA PCR SCREENING     Status: None   Collection Time    04/27/14 12:58 AM      Result Value Ref Range Status   MRSA by PCR NEGATIVE  NEGATIVE Final   Comment:            The GeneXpert MRSA Assay (FDA     approved for NASAL specimens     only), is one component of a     comprehensive MRSA colonization  surveillance program. It is not     intended to diagnose MRSA     infection nor to guide or     monitor treatment for     MRSA infections.    Studies/Results: Dg Chest 2 View  04/26/2014   CLINICAL DATA:  58 year old male with shortness of breath and chest pain status post dialysis. Initial encounter.  EXAM: CHEST  2 VIEW  COMPARISON:  None.  FINDINGS: Low lung volumes. Increased interstitial opacity. No pleural effusion. No consolidation. No pneumothorax. Mild cardiomegaly. Other mediastinal contours are within normal limits. Visualized tracheal air  column is within normal limits. No acute osseous abnormality identified.  IMPRESSION: Low lung volumes with increased interstitial opacity, favor interstitial edema in this setting. Viral / atypical respiratory infection is the main differential consideration.   Electronically Signed   By: Lars Pinks M.D.   On: 04/26/2014 19:37    Medications: Scheduled Meds: . amitriptyline  100 mg Oral QHS  . amLODipine  10 mg Oral Daily  . aspirin EC  81 mg Oral Daily  . atorvastatin  40 mg Oral Daily  . carvedilol  50 mg Oral BID WC  . cinacalcet  60 mg Oral QHS  . clopidogrel  75 mg Oral Daily  . heparin  5,000 Units Subcutaneous 3 times per day  . insulin aspart  0-15 Units Subcutaneous TID WC  . insulin aspart  0-5 Units Subcutaneous QHS  . insulin aspart  15 Units Subcutaneous TID AC  . insulin glargine  25 Units Subcutaneous QHS  . nitroGLYCERIN  0.5 inch Topical 4 times per day  . pregabalin  100 mg Oral TID  . sevelamer carbonate  1,600 mg Oral Q1200  . sevelamer carbonate  2,400 mg Oral BID WC  . venlafaxine  37.5 mg Oral BID      LOS: 1 day   Robertta Halfhill M.D. [facility] 04/27/2014, 10:19 AM Pager: [phone]  If 7PM-7AM, please contact night-coverage [email or web address] Password [email or web address]  **Disclaimer: This note was dictated with voice recognition software. Similar sounding words can inadvertently be transcribed and this note may contain transcription errors which may not have been corrected upon publication of note.**

## 2014-04-27 NOTE — Discharge Instructions (Signed)
Dialysis Diet °A dialysis diet is a special diet when you start peritoneal dialysis or hemodialysis. Foods must be chosen carefully because different foods produce different wastes in your blood. If you are on dialysis treatment, that means your kidneys have stopped working properly on their own. This means the kidneys are removing very little or no wastes from your blood. Dialysis can perform the function of a healthy kidney and filter wastes from your body. However, between dialysis sessions, wastes build up in your blood and can make you sick. You can reduce the amount of wastes that build up in your blood by watching what you eat and drink. A good meal plan can improve your dialysis and your health. Your dietitian or renal dietitian can help you plan meals.  °FLUIDS °In between dialysis sessions, your kidneys may be able to remove some fluid or none at all. Fluid can build up and cause swelling and weight gain. The extra fluid affects your blood pressure and can make your heart work harder. Overloading your body with fluid could lead to serious heart trouble. Every person on dialysis has a different amount of fluid that they can have each day. Talk to your dietitian about how much fluid you can have each day and write that down.  °Foods that add to your fluid intake include: °· Foods that contain liquid at room temperature, such as soup, gelatin dessert, and ice cream. °· Fruits and vegetables, such as melons, grapes, apples, oranges, tomatoes, lettuce, and celery. °Your dietitian will be able to give you other tips for managing your thirst. °POTASSIUM °Potassium is a mineral found in many foods, especially milk, fruits, and vegetables. It affects how steadily your heart beats. Potassium levels can rise between dialysis sessions and can affect your heartbeat and may even cause death.  °Avoid foods high in potassium. If you do eat high-potassium foods, eat smaller portions. For example, eat half a pear instead of  a whole pear. Eat only very small portions of oranges and melons. You can remove some of the potassium from potatoes and other vegetables by peeling them and then soaking them in a large amount of water for several hours. Drain and rinse them before cooking. Talk to a dietitian about foods you can eat instead of high-potassium foods. °High-potassium foods and drinks include: °· Apricots. °· Brussels sprouts. °· Dates. °· Lima beans. °· Oranges. °· Prune juice. °· Spinach. °· Avocados. °· Milk. °· Figs. °· Melons. °· Peanuts. °· Prunes. °· Tomatoes. °· Bananas. °· Cantaloupe. °· Kiwi fruit. °· Nectarines. °· Asparagus spears. °· Raisins. °· Winter squash. °· Beets. °· Clams. °· Orange juice. °· Potatoes. °· Sardines. °· Yogurt. °PHOSPHORUS °Phosphorus is a mineral found in many foods. If you have too much phosphorus in your blood, your bones lose calcium. Losing calcium will make your bones weak and more likely to break. Also, too much phosphorus may make your skin itch. Food high in phosphorus include milk, cheese, dried beans, peas, colas, nuts, and peanut butter. Avoid eating too much of these foods. Usually, people on dialysis are limited to ½ cup of milk per day. Talk to a dietitian about foods you can eat instead of high-phosphorus foods. °PROTEIN °You may be encouraged to eat as much "high-quality protein" as you can. High-quality proteins come from meat, fish, poultry, and eggs. Choose low-fat (lean) meats that are also low in phosphorus. If you are a vegetarian, ask your dietitian about other ways to get your protein. Low-fat milk   is a good source of protein, but milk is high in phosphorus and potassium and adds to your fluid intake. Talk to a dietitian to see if milk fits into your food plan. SODIUM Sodium makes you thirsty, and if you are on dialysis, you must restrict how much fluid you drink. Therefore, try to eat fresh foods that are naturally low in sodium. Stay away from canned foods and frozen  dinners. Look for products labeled "low sodium." Do not use salt substitutes because they contain potassium. Talk to your dietitian about foods and spice blends without sodium or potassium.  CALORIES Calories come from food and provide energy for your body. Your dietitian may recommend adding or cutting down on the calories you eat depending on if you need to lose or gain weight. Talk to a dietitian about foods you can eat to either gain or lose weight. VITAMINS AND MINERALS Your caregiver may prescribe a vitamin and mineral supplement. Vitamins and minerals may be missing from your diet because you have to avoid so many foods. Talk to your dietitian or kidney doctor before choosing a supplement. Many vitamin or mineral supplements sold on the store shelf may be harmful to you. Document Released: 06/21/2004 Document Revised: 03/25/2012 Document Reviewed: 12/04/2011 [facility] Patient Information 2015 [place], [place]. This information is not intended to replace advice given to you by your health care provider. Make sure you discuss any questions you have with your health care provider.

## 2014-04-27 NOTE — Discharge Summary (Signed)
Physician Discharge Summary  Patient ID: [name] [name] MRN: [id number] DOB/AGE: 56/13/1957 56 y.o.  Admit date: 04/26/2014 Discharge date: 04/27/2014  Primary Care Physician:  [name]  Discharge Diagnoses:    . Chest pain resolved, probably angina   . ESRD (end stage renal disease) . Essential hypertension, benign . CAD (coronary artery disease) of artery bypass graft . Type II or unspecified type diabetes mellitus without mention of complication, uncontrolled  Consults: Cardiology, Dr. Mare Ferrari   Recommendations for Outpatient Follow-up:  Patient was recommended to follow cardiology here in [facility]  Allergies:   Allergies  Allergen Reactions  . Enalapril Hives     Discharge Medications:   Medication List         amitriptyline 100 MG tablet  Commonly known as:  ELAVIL  Take 100 mg by mouth at bedtime.     amLODipine 10 MG tablet  Commonly known as:  NORVASC  Take 10 mg by mouth daily.     aspirin EC 81 MG tablet  Take 81 mg by mouth daily.     atorvastatin 40 MG tablet  Commonly known as:  LIPITOR  Take 40 mg by mouth daily.     carvedilol 25 MG tablet  Commonly known as:  COREG  Take 50 mg by mouth 2 (two) times daily with a meal.     cinacalcet 30 MG tablet  Commonly known as:  SENSIPAR  Take 60 mg by mouth at bedtime.     clopidogrel 75 MG tablet  Commonly known as:  PLAVIX  Take 75 mg by mouth daily.     COMBIVENT RESPIMAT 20-100 MCG/ACT Aers respimat  Generic drug:  Ipratropium-Albuterol  Inhale 2 puffs into the lungs every 6 (six) hours as needed for wheezing.     insulin aspart 100 UNIT/ML injection  Commonly known as:  novoLOG  Inject 15-20 Units into the skin 3 (three) times daily before meals.     insulin glargine 100 UNIT/ML injection  Commonly known as:  LANTUS  Inject 25 Units into the skin at bedtime.     nitroGLYCERIN 0.4 MG SL tablet  Commonly known as:  NITROSTAT  Place 0.4 mg under the tongue every 5 (five)  minutes as needed for chest pain.     pregabalin 100 MG capsule  Commonly known as:  LYRICA  Take 100 mg by mouth 3 (three) times daily.     sevelamer carbonate 800 MG tablet  Commonly known as:  RENVELA  Take 1,600-2,400 mg by mouth 3 (three) times daily with meals. *takes 2400mg  in the morning, 1600mg  midday, and 2400mg  at night*     venlafaxine 37.5 MG tablet  Commonly known as:  EFFEXOR  Take 37.5 mg by mouth 2 (two) times daily.         Brief H and P: For complete details please refer to admission H and P, but in brief [name] [name] is a 58 y.o. male with Past medical history of coronary artery disease, hypertension, ESRD on hemodialysis, type 2 diabetes mellitus, mood disorder.  Patient presented with complaints of chest pain. He reported that when he went for the dialysis he was asymptomatic. A he was started on dialysis, then he started having complaints of pain which was substernal sharp shooting radiating to his right side associated with shortness of breath and nausea. Also he had some diaphoresis. After the dialysis the pain resolved. He presented to the ER with chest pain-free initially but then he had recurrence of his  chest pain. Pain resolved with nitroglycerin and morphine.  He mentioned that this pain is more severe than the pain that led to his right coronary artery angiography.  He denied any dizziness or lightheadedness. He denied any fever chills cough nausea vomiting abdominal pain diarrhea constipation the time of my evaluation.  He used to followup with [facility] and had his prior coronary artery workup in his cardiologist office at [facility]. Currently he has not seen any cardiologist since last one to 2 years.   Hospital Course:  Chest pain with history of coronary artery disease, patient reports 2 stents and last stress test was several years ago. He was followed by cardiology at [facility], he relocated to [place] and has not followed with any cardiologist here.   Patient was ruled out for acute ACS, negative cardiac enzymes, EKG showed normal sinus rhythm and no ischemic changes given his cardiac history, cardiology was consulted and recommended Myoview stress test and echocardiogram for further workup. Patient was continued on aspirin, Coreg, Plavix, statin  2-D echo showed EF of 123456, grade 2 diastolic dysfunction Nuclear medicine stress test showed no pharmacological induced reversible ischemia, EF 50%  ESRD (end stage renal disease) on HD, MWF  - Patient has received hemodialysis yesterday   Essential hypertension, benign  -Currently stable, continue Norvasc, Coreg   Type II or unspecified type diabetes mellitus without mention of complication, uncontrolled  - Continue insulin regimen at home      Day of Discharge BP 167/96  Pulse 95  Temp(Src) 98 F (36.7 C) (Oral)  Resp 21  Ht 6' (1.829 m)  Wt 109.861 kg (242 lb 3.2 oz)  BMI 32.84 kg/m2  SpO2 97%  Physical Exam: General: Alert and awake oriented x3 not in any acute distress. HEENT: anicteric sclera, pupils reactive to light and accommodation CVS: S1-S2 clear no murmur rubs or gallops Chest: clear to auscultation bilaterally, no wheezing rales or rhonchi Abdomen: soft nontender, nondistended, normal bowel sounds Extremities: no cyanosis, clubbing or edema noted bilaterally Neuro: Cranial nerves II-XII intact, no focal neurological deficits   The results of significant diagnostics from this hospitalization (including imaging, microbiology, ancillary and laboratory) are listed below for reference.    LAB RESULTS: Basic Metabolic Panel:  Recent Labs Lab 04/26/14 1836 04/27/14 0320  NA 138 140  K 3.9 4.0  CL 94* 94*  CO2 27 29  GLUCOSE 202* 160*  BUN 17 21  CREATININE 6.94* 8.10*  CALCIUM 9.9 9.1   Liver Function Tests:  Recent Labs Lab 04/27/14 0320  AST <5  ALT <5  ALKPHOS 63  BILITOT 0.3  PROT 7.4  ALBUMIN 3.3*   No results found for this basename:  LIPASE, AMYLASE,  in the last 168 hours No results found for this basename: AMMONIA,  in the last 168 hours CBC:  Recent Labs Lab 04/26/14 1836 04/27/14 0320  WBC 6.9 4.9  NEUTROABS  --  2.7  HGB 11.4* 10.9*  HCT 35.8* 35.6*  MCV 84.2 86.8  PLT 178 156   Cardiac Enzymes:  Recent Labs Lab 04/27/14 0025 04/27/14 0320  TROPONINI <0.30 <0.30   BNP: No components found with this basename: POCBNP,  CBG:  Recent Labs Lab 04/27/14 0011 04/27/14 0754  GLUCAP 83 136*    Significant Diagnostic Studies:  Dg Chest 2 View  04/26/2014   CLINICAL DATA:  58 year old male with shortness of breath and chest pain status post dialysis. Initial encounter.  EXAM: CHEST  2 VIEW  COMPARISON:  None.  FINDINGS: Low lung volumes. Increased interstitial opacity. No pleural effusion. No consolidation. No pneumothorax. Mild cardiomegaly. Other mediastinal contours are within normal limits. Visualized tracheal air column is within normal limits. No acute osseous abnormality identified.  IMPRESSION: Low lung volumes with increased interstitial opacity, favor interstitial edema in this setting. Viral / atypical respiratory infection is the main differential consideration.   Electronically Signed   By: Lars Pinks M.D.   On: 04/26/2014 19:37    2D ECHO: Study Conclusions  - Left ventricle: The cavity size was normal. Wall thickness was increased in a pattern of mild LVH. Systolic function was normal. The estimated ejection fraction was in the range of 60% to 65%. Wall motion was normal; there were no regional wall motion abnormalities. Features are consistent with a pseudonormal left ventricular filling pattern, with concomitant abnormal relaxation and increased filling pressure (grade 2 diastolic dysfunction). - Mitral valve: Calcified annulus. There was mild regurgitation. - Left atrium: The atrium was mildly dilated.    Disposition and Follow-up:    DISPOSITION: Home  DIET: Heart  healthy     DISCHARGE FOLLOW-UP Follow-up Information   Follow up with Darlin Coco, MD. Schedule an appointment as soon as possible for a visit in 2 weeks. (for hospital follow-up)    Specialty:  Cardiology   Contact information:   [address] [place] [address] [phone]       Time spent on Discharge: 40 minutes  Signed:   RAI,RIPUDEEP M.D. [facility] 04/27/2014, 10:24 AM Pager: [phone]   **Disclaimer: This note was dictated with voice recognition software. Similar sounding words can inadvertently be transcribed and this note may contain transcription errors which may not have been corrected upon publication of note.**

## 2014-05-25 DIAGNOSIS — R200 Anesthesia of skin: Secondary | ICD-10-CM

## 2014-05-25 DIAGNOSIS — M79601 Pain in right arm: Secondary | ICD-10-CM | POA: Insufficient documentation

## 2014-05-25 HISTORY — DX: Anesthesia of skin: R20.0

## 2014-10-01 DIAGNOSIS — J189 Pneumonia, unspecified organism: Secondary | ICD-10-CM | POA: Insufficient documentation

## 2014-11-09 ENCOUNTER — Emergency Department (HOSPITAL_COMMUNITY): Payer: Medicare Other

## 2014-11-09 ENCOUNTER — Encounter (HOSPITAL_COMMUNITY): Payer: Self-pay | Admitting: *Deleted

## 2014-11-09 ENCOUNTER — Inpatient Hospital Stay (HOSPITAL_COMMUNITY)
Admission: EM | Admit: 2014-11-09 | Discharge: 2014-11-10 | DRG: 640 | Disposition: A | Discharge status: Home / Self Care | Payer: Medicare Other | Attending: Internal Medicine | Admitting: Internal Medicine

## 2014-11-09 DIAGNOSIS — I2510 Atherosclerotic heart disease of native coronary artery without angina pectoris: Secondary | ICD-10-CM | POA: Diagnosis present

## 2014-11-09 DIAGNOSIS — Z79899 Other long term (current) drug therapy: Secondary | ICD-10-CM

## 2014-11-09 DIAGNOSIS — Z7982 Long term (current) use of aspirin: Secondary | ICD-10-CM

## 2014-11-09 DIAGNOSIS — Z7902 Long term (current) use of antithrombotics/antiplatelets: Secondary | ICD-10-CM | POA: Diagnosis not present

## 2014-11-09 DIAGNOSIS — Z955 Presence of coronary angioplasty implant and graft: Secondary | ICD-10-CM | POA: Diagnosis not present

## 2014-11-09 DIAGNOSIS — Z9861 Coronary angioplasty status: Secondary | ICD-10-CM

## 2014-11-09 DIAGNOSIS — F329 Major depressive disorder, single episode, unspecified: Secondary | ICD-10-CM | POA: Diagnosis present

## 2014-11-09 DIAGNOSIS — E8770 Fluid overload, unspecified: Secondary | ICD-10-CM | POA: Diagnosis present

## 2014-11-09 DIAGNOSIS — N2581 Secondary hyperparathyroidism of renal origin: Secondary | ICD-10-CM | POA: Diagnosis present

## 2014-11-09 DIAGNOSIS — I10 Essential (primary) hypertension: Secondary | ICD-10-CM | POA: Diagnosis present

## 2014-11-09 DIAGNOSIS — N186 End stage renal disease: Secondary | ICD-10-CM | POA: Diagnosis present

## 2014-11-09 DIAGNOSIS — I509 Heart failure, unspecified: Secondary | ICD-10-CM

## 2014-11-09 DIAGNOSIS — Z951 Presence of aortocoronary bypass graft: Secondary | ICD-10-CM

## 2014-11-09 DIAGNOSIS — E119 Type 2 diabetes mellitus without complications: Secondary | ICD-10-CM | POA: Diagnosis present

## 2014-11-09 DIAGNOSIS — E8779 Other fluid overload: Principal | ICD-10-CM | POA: Diagnosis present

## 2014-11-09 DIAGNOSIS — I120 Hypertensive chronic kidney disease with stage 5 chronic kidney disease or end stage renal disease: Secondary | ICD-10-CM | POA: Diagnosis present

## 2014-11-09 DIAGNOSIS — Z9115 Patient's noncompliance with renal dialysis: Secondary | ICD-10-CM | POA: Diagnosis present

## 2014-11-09 DIAGNOSIS — R0602 Shortness of breath: Secondary | ICD-10-CM

## 2014-11-09 DIAGNOSIS — Z794 Long term (current) use of insulin: Secondary | ICD-10-CM | POA: Diagnosis not present

## 2014-11-09 DIAGNOSIS — Z7951 Long term (current) use of inhaled steroids: Secondary | ICD-10-CM

## 2014-11-09 DIAGNOSIS — N400 Enlarged prostate without lower urinary tract symptoms: Secondary | ICD-10-CM | POA: Diagnosis present

## 2014-11-09 DIAGNOSIS — Z87891 Personal history of nicotine dependence: Secondary | ICD-10-CM

## 2014-11-09 DIAGNOSIS — I25810 Atherosclerosis of coronary artery bypass graft(s) without angina pectoris: Secondary | ICD-10-CM | POA: Diagnosis present

## 2014-11-09 DIAGNOSIS — R0789 Other chest pain: Secondary | ICD-10-CM | POA: Diagnosis present

## 2014-11-09 DIAGNOSIS — Z8639 Personal history of other endocrine, nutritional and metabolic disease: Secondary | ICD-10-CM

## 2014-11-09 LAB — I-STAT CHEM 8, ED
BUN: 60 mg/dL — ABNORMAL HIGH (ref 6–23)
CALCIUM ION: 0.98 mmol/L — AB (ref 1.12–1.23)
CREATININE: 13 mg/dL — AB (ref 0.50–1.35)
Chloride: 102 mmol/L (ref 96–112)
GLUCOSE: 109 mg/dL — AB (ref 70–99)
HCT: 40 % (ref 39.0–52.0)
Hemoglobin: 13.6 g/dL (ref 13.0–17.0)
Potassium: 4.9 mmol/L (ref 3.5–5.1)
SODIUM: 140 mmol/L (ref 135–145)
TCO2: 22 mmol/L (ref 0–100)

## 2014-11-09 LAB — RAPID URINE DRUG SCREEN, HOSP PERFORMED
AMPHETAMINES: NOT DETECTED
Barbiturates: NOT DETECTED
Benzodiazepines: NOT DETECTED
Cocaine: NOT DETECTED
OPIATES: NOT DETECTED
TETRAHYDROCANNABINOL: NOT DETECTED

## 2014-11-09 LAB — LIPID PANEL
CHOLESTEROL: 97 mg/dL (ref 0–200)
HDL: 23 mg/dL — ABNORMAL LOW (ref >39)
LDL CALC: 57 mg/dL (ref 0–99)
Total CHOL/HDL Ratio: 4.2 RATIO
Triglycerides: 84 mg/dL (ref <150)
VLDL: 17 mg/dL (ref 0–40)

## 2014-11-09 LAB — I-STAT TROPONIN, ED: Troponin i, poc: 0.02 ng/mL (ref 0.00–0.08)

## 2014-11-09 LAB — HEPATIC FUNCTION PANEL
ALBUMIN: 4 g/dL (ref 3.5–5.2)
ALT: 12 U/L (ref 0–53)
AST: 15 U/L (ref 0–37)
Alkaline Phosphatase: 77 U/L (ref 39–117)
Bilirubin, Direct: <0.1 mg/dL (ref 0.0–0.5)
Total Bilirubin: 0.5 mg/dL (ref 0.3–1.2)
Total Protein: 7.9 g/dL (ref 6.0–8.3)

## 2014-11-09 LAB — URINALYSIS, ROUTINE W REFLEX MICROSCOPIC
Bilirubin Urine: NEGATIVE
GLUCOSE, UA: 250 mg/dL — AB
KETONES UR: NEGATIVE mg/dL
LEUKOCYTES UA: NEGATIVE
NITRITE: NEGATIVE
PROTEIN: 100 mg/dL — AB
SPECIFIC GRAVITY, URINE: 1.009 (ref 1.005–1.030)
Urobilinogen, UA: 0.2 mg/dL (ref 0.0–1.0)
pH: 8 (ref 5.0–8.0)

## 2014-11-09 LAB — TROPONIN I
TROPONIN I: 0.06 ng/mL — AB (ref <0.031)
Troponin I: 0.03 ng/mL (ref <0.031)

## 2014-11-09 LAB — PHOSPHORUS: Phosphorus: 10.1 mg/dL — ABNORMAL HIGH (ref 2.3–4.6)

## 2014-11-09 LAB — MAGNESIUM: MAGNESIUM: 2.2 mg/dL (ref 1.5–2.5)

## 2014-11-09 LAB — URINE MICROSCOPIC-ADD ON

## 2014-11-09 LAB — GLUCOSE, CAPILLARY
Glucose-Capillary: 88 mg/dL (ref 70–99)
Glucose-Capillary: 145 mg/dL — ABNORMAL HIGH (ref 70–99)

## 2014-11-09 MED ORDER — INSULIN ASPART 100 UNIT/ML ~~LOC~~ SOLN
0.0000 [IU] | Freq: Three times a day (TID) | SUBCUTANEOUS | Status: DC
  Administered 2014-11-10: 2 [IU] via SUBCUTANEOUS

## 2014-11-09 MED ORDER — SEVELAMER CARBONATE 800 MG PO TABS
2400.0000 mg | ORAL_TABLET | Freq: Two times a day (BID) | ORAL | Status: DC
  Administered 2014-11-09 – 2014-11-10 (×2): 2400 mg via ORAL
  Filled 2014-11-09 (×4): qty 3

## 2014-11-09 MED ORDER — CINACALCET HCL 30 MG PO TABS
90.0000 mg | ORAL_TABLET | Freq: Every evening | ORAL | Status: DC
  Administered 2014-11-09: 90 mg via ORAL
  Filled 2014-11-09 (×2): qty 3

## 2014-11-09 MED ORDER — HEPARIN SODIUM (PORCINE) 5000 UNIT/ML IJ SOLN
5000.0000 [IU] | Freq: Three times a day (TID) | INTRAMUSCULAR | Status: DC
  Administered 2014-11-09 – 2014-11-10 (×2): 5000 [IU] via SUBCUTANEOUS
  Filled 2014-11-09 (×4): qty 1

## 2014-11-09 MED ORDER — IPRATROPIUM-ALBUTEROL 0.5-2.5 (3) MG/3ML IN SOLN: 3.0000 mL | Freq: Four times a day (QID) | RESPIRATORY_TRACT | Status: DC | PRN

## 2014-11-09 MED ORDER — SEVELAMER CARBONATE 800 MG PO TABS: 1600.0000 mg | ORAL_TABLET | Freq: Three times a day (TID) | ORAL | Status: DC

## 2014-11-09 MED ORDER — SEVELAMER CARBONATE 800 MG PO TABS
1600.0000 mg | ORAL_TABLET | Freq: Every day | ORAL | Status: DC
  Administered 2014-11-09 – 2014-11-10 (×2): 1600 mg via ORAL
  Filled 2014-11-09 (×2): qty 2

## 2014-11-09 MED ORDER — INSULIN ASPART 100 UNIT/ML ~~LOC~~ SOLN: 0.0000 [IU] | Freq: Every day | SUBCUTANEOUS | Status: DC

## 2014-11-09 MED ORDER — CARVEDILOL 25 MG PO TABS
50.0000 mg | ORAL_TABLET | Freq: Two times a day (BID) | ORAL | Status: DC
  Administered 2014-11-09 – 2014-11-10 (×3): 50 mg via ORAL
  Filled 2014-11-09 (×5): qty 2

## 2014-11-09 MED ORDER — VENLAFAXINE HCL 37.5 MG PO TABS
37.5000 mg | ORAL_TABLET | Freq: Two times a day (BID) | ORAL | Status: DC
  Administered 2014-11-09 – 2014-11-10 (×2): 37.5 mg via ORAL
  Filled 2014-11-09 (×4): qty 1

## 2014-11-09 MED ORDER — CALCIUM ACETATE 667 MG PO CAPS
1334.0000 mg | ORAL_CAPSULE | Freq: Three times a day (TID) | ORAL | Status: DC
  Administered 2014-11-09 – 2014-11-10 (×3): 1334 mg via ORAL
  Filled 2014-11-09 (×5): qty 2

## 2014-11-09 MED ORDER — SODIUM CHLORIDE 0.9 % IJ SOLN
3.0000 mL | Freq: Two times a day (BID) | INTRAMUSCULAR | Status: DC
  Administered 2014-11-09 – 2014-11-10 (×3): 3 mL via INTRAVENOUS

## 2014-11-09 MED ORDER — NITROGLYCERIN 2 % TD OINT
1.0000 [in_us] | TOPICAL_OINTMENT | Freq: Once | TRANSDERMAL | Status: AC
  Administered 2014-11-09: 1 [in_us] via TOPICAL
  Filled 2014-11-09: qty 1

## 2014-11-09 MED ORDER — PREGABALIN 100 MG PO CAPS
100.0000 mg | ORAL_CAPSULE | Freq: Three times a day (TID) | ORAL | Status: DC
  Administered 2014-11-09 – 2014-11-10 (×3): 100 mg via ORAL
  Filled 2014-11-09 (×4): qty 1

## 2014-11-09 MED ORDER — ATORVASTATIN CALCIUM 40 MG PO TABS
40.0000 mg | ORAL_TABLET | Freq: Every day | ORAL | Status: DC
  Administered 2014-11-09 – 2014-11-10 (×2): 40 mg via ORAL
  Filled 2014-11-09 (×2): qty 1

## 2014-11-09 MED ORDER — CINACALCET HCL 30 MG PO TABS
60.0000 mg | ORAL_TABLET | Freq: Every evening | ORAL | Status: DC
  Filled 2014-11-09: qty 2

## 2014-11-09 MED ORDER — ONDANSETRON HCL 4 MG PO TABS: 4.0000 mg | ORAL_TABLET | Freq: Four times a day (QID) | ORAL | Status: DC | PRN

## 2014-11-09 MED ORDER — ACETAMINOPHEN 325 MG PO TABS
650.0000 mg | ORAL_TABLET | Freq: Once | ORAL | Status: AC
  Administered 2014-11-09: 650 mg via ORAL
  Filled 2014-11-09: qty 2

## 2014-11-09 MED ORDER — RENA-VITE PO TABS
1.0000 | ORAL_TABLET | Freq: Every day | ORAL | Status: DC
  Administered 2014-11-09: 1 via ORAL
  Filled 2014-11-09 (×2): qty 1

## 2014-11-09 MED ORDER — ACETAMINOPHEN 325 MG PO TABS
650.0000 mg | ORAL_TABLET | Freq: Four times a day (QID) | ORAL | Status: DC | PRN
  Administered 2014-11-09: 650 mg via ORAL

## 2014-11-09 MED ORDER — IPRATROPIUM-ALBUTEROL 20-100 MCG/ACT IN AERS: 2.0000 | INHALATION_SPRAY | Freq: Four times a day (QID) | RESPIRATORY_TRACT | Status: DC | PRN

## 2014-11-09 MED ORDER — DOXERCALCIFEROL 4 MCG/2ML IV SOLN: 2.0000 ug | INTRAVENOUS | Status: DC

## 2014-11-09 MED ORDER — AMITRIPTYLINE HCL 100 MG PO TABS
100.0000 mg | ORAL_TABLET | Freq: Every day | ORAL | Status: DC
  Administered 2014-11-09: 100 mg via ORAL
  Filled 2014-11-09 (×2): qty 1

## 2014-11-09 MED ORDER — ASPIRIN EC 81 MG PO TBEC
81.0000 mg | DELAYED_RELEASE_TABLET | Freq: Every day | ORAL | Status: DC
  Administered 2014-11-09 – 2014-11-10 (×2): 81 mg via ORAL
  Filled 2014-11-09 (×2): qty 1

## 2014-11-09 MED ORDER — ONDANSETRON HCL 4 MG/2ML IJ SOLN: 4.0000 mg | Freq: Four times a day (QID) | INTRAMUSCULAR | Status: DC | PRN

## 2014-11-09 MED ORDER — AMLODIPINE BESYLATE 10 MG PO TABS
10.0000 mg | ORAL_TABLET | Freq: Every day | ORAL | Status: DC
Start: 2014-11-09 — End: 2014-11-10
  Administered 2014-11-09 – 2014-11-10 (×2): 10 mg via ORAL
  Filled 2014-11-09 (×2): qty 1

## 2014-11-09 NOTE — ED Notes (Signed)
Spoke with Dr. Eulas Post regarding pt. Chest pain. States it is ok for pt. To go to telemetry.

## 2014-11-09 NOTE — H&P (Signed)
Date: 11/09/2014               Patient Name:  [name] [name] MRN: [id number]  DOB: Oct 25, 1955 Age / Sex: 58 y.o., male   PCP: [name]         Medical Service: Internal Medicine Teaching Service         Attending Physician: Dr. Karren Cobble, MD    First Contact: Dr. Arcelia Jew Pager: [phone]  Second Contact: Dr. Naaman Plummer Pager: [phone]       After Hours (After 5p/  First Contact Pager: [phone]  weekends / holidays): Second Contact Pager: [phone]   Chief Complaint: SOB  History of Present Illness:  59yo M w/ PMH CAD s/p stenting, DM2, HTN, grade 2 diastolic dysfunction. and ESRD on HD MWF presents with SOB after missing HD yesterday. He states that he has been living between [place] and [place] and receiving medical care in [place], but is now planning to stay in [place]. He has been getting his HD in [place], but was here in town yesterday and decided to go over to the dialysis center in [place] for his HD session. However, the center did not have orders for the patient, and he was unable to be dialyzed. He states that he began to develop SOB and chest pressure/pain last night and decided to go to the ED this morning. His chest pressure/pain is better lying on his back and is worse with deep inspiration. Nitro paste was applied to his chest on arrival to the ED, which he states has improved his pressure/pain. He denies fevers or cough, but does endorse nausea and vomiting this morning. He does make some urine.   Of note, he does have a h/o CAD s/p approximately 3 stents, per patient. He was previously seen by Cardiology at [facility], but was admitted 04/2014 with chest pain. A Lexiscan was performed and was negative for ischemic changes, TTE w/ normal systolic function, EF [id number], and grade 2 diastolic dysfunction. He was reccommended to f/u with Cardiology here in [place] but has not done so. He does note that an MD in [place], not a Cardiologist changed him from Plavix to  what he thinks is Apixaban 2/2 hematuria and an enlarged prostate.    Meds: No current facility-administered medications for this encounter.   Current Outpatient Prescriptions  Medication Sig Dispense Refill  . amitriptyline (ELAVIL) 100 MG tablet Take 100 mg by mouth at bedtime.    [name] amLODipine (NORVASC) 10 MG tablet Take 10 mg by mouth daily.    [name] aspirin EC 81 MG tablet Take 81 mg by mouth daily.    [name] atorvastatin (LIPITOR) 40 MG tablet Take 40 mg by mouth daily.    . carvedilol (COREG) 25 MG tablet Take 50 mg by mouth 2 (two) times daily with a meal.    . cinacalcet (SENSIPAR) 30 MG tablet Take 60 mg by mouth at bedtime.    . insulin glargine (LANTUS) 100 UNIT/ML injection Inject 25 Units into the skin at bedtime.    . Ipratropium-Albuterol (COMBIVENT RESPIMAT) 20-100 MCG/ACT AERS respimat Inhale 2 puffs into the lungs every 6 (six) hours as needed for wheezing.    . nitroGLYCERIN (NITROSTAT) 0.4 MG SL tablet Place 0.4 mg under the tongue every 5 (five) minutes as needed for chest pain.    . pregabalin (LYRICA) 100 MG capsule Take 100 mg by mouth 3 (three) times daily.    . sevelamer carbonate (RENVELA) 800 MG tablet Take 1,600-2,400  mg by mouth 3 (three) times daily with meals. *takes 2400mg  in the morning, 1600mg  midday, and 2400mg  at night*    . venlafaxine (EFFEXOR) 37.5 MG tablet Take 37.5 mg by mouth 2 (two) times daily.    . clopidogrel (PLAVIX) 75 MG tablet Take 75 mg by mouth daily.    . insulin aspart (NOVOLOG) 100 UNIT/ML injection Inject 15-20 Units into the skin 3 (three) times daily before meals.      Allergies: Allergies as of 11/09/2014 - Review Complete 11/09/2014  Allergen Reaction Noted  . Enalapril Hives 04/26/2014   Past Medical History  Diagnosis Date  . Hypertension   . Renal disorder   . Diabetes mellitus without complication    History reviewed. No pertinent past surgical history. No family history on file. History   Social History  . Marital  Status: Unknown    Spouse Name: N/A    Number of Children: N/A  . Years of Education: N/A   Occupational History  . Not on file.   Social History Main Topics  . Smoking status: Never Smoker   . Smokeless tobacco: Never Used  . Alcohol Use: No  . Drug Use: No  . Sexual Activity: Not on file   Other Topics Concern  . Not on file   Social History Narrative    Review of Systems: A 12 point ROS was performed; pertinent positives and negatives were noted in the HPI   Physical Exam: Blood pressure 157/92, pulse 98, temperature 97.6 F (36.4 C), temperature source Oral, resp. rate 21, height 6' (1.829 m), weight 206 lb (93.441 kg), SpO2 93 %. General: Alert & oriented x 3, well-developed, and cooperative on examination.  Head: Normocephalic and atraumatic.  Eyes: EOMI, pupils equal, round, and reactive to light, no injection and anicteric.  Mouth: Pharynx pink and moist, no erythema or exudates. Dentures in place. Neck: Supple, full ROM, no JVD.  Lungs: Increased work of breathing, mild distress, crackles at bases, extending 2/3 up the lung bilaterally. Heart: Tachycardic, regular rhythm, 3/6 continuous murmur audible.  Abdomen: Soft, non-tender, non-distended, normal bowel sounds, no guarding, no rebound tenderness, no organomegaly.  Msk: No joint swelling, warmth, or erythema.  Extremities: 2+ DP pulses bilaterally. Trace BLE edema Neurologic: Alert & oriented X3, cranial nerves II-XII intact, strength diminished but equal in all extremities.  Skin: No rashes, multiple tattoos.  Psych: Normally interactive, good eye contact, normal mood and affect.   Lab results: Basic Metabolic Panel:  Recent Labs  11/09/14 0608  NA 140  K 4.9  CL 102  GLUCOSE 109*  BUN 60*  CREATININE 13.00*   Liver Function Tests: No results for input(s): AST, ALT, ALKPHOS, BILITOT, PROT, ALBUMIN in the last 72 hours. No results for input(s): LIPASE, AMYLASE in the last 72 hours. No results for  input(s): AMMONIA in the last 72 hours. CBC:  Recent Labs  11/09/14 0608  HGB 13.6  HCT 40.0   Cardiac Enzymes: 11/09/14: i-STAT Troponin 0.02  Urine Drug Screen: Drugs of Abuse  Pending  Urinalysis: Pending  Imaging results:  Dg Chest Port 1 View  11/09/2014   CLINICAL DATA:  Chest pain and difficulty breathing  EXAM: PORTABLE CHEST - 1 VIEW  COMPARISON:  April 26, 2014  FINDINGS: There is stable elevation of the left hemidiaphragm. The interstitium is prominent, probably representing chronic interstitial edema. Heart is mildly enlarged, and there is slight pulmonary venous hypertension. No adenopathy. No airspace consolidation. No bone lesions.  IMPRESSION: Findings felt  to be consistent with a degree of chronic congestive heart failure. No appreciable change from prior study. No airspace consolidation.   Electronically Signed   By: Lowella Grip M.D.   On: 11/09/2014 07:02    Other results: EKG: Sinus rhythm, left axis deviation, prolonged QTc. Wander present. Appears unchanged from previous EKG.   Assessment & Plan by Problem: 59yo M w/ PMH CAD s/p stenting, DM2, HTN, grade 2 diastolic dysfunction. and ESRD on HD MWF presents with SOB after missing HD.   #Volume overload: Last HD was Friday, 1/29, per pt. Pt missed HD yesterday while in [place], normal HD sessions are in [facility]. Pt planning to live in [place] and needs dialysis center here. He states that he is normally compliant with his HD. He presented with SOB, chest pain/pressure, and nausea/vomiting. CXR with chronic pulmonary edema. Crackles heard 1/2 way up lung fields bilaterally, trace edema in BLE. No edema noted in the upper thighs or abdomen. Pt w/o asterixis or JVD, and weight is down 36 lbs from July, but needs to be dialyzed.  Admitting for initiation of HD and to facilitate establishing at a dialysis center in [place] to IMTS to tele - Nephrology consult for HD  - Daily weights - Strict I&Os  #Chest pain/pressure:  Symptoms in the setting of volume overload. However, with his CAD history and his symptoms were improved with nitro paste, cannot r/o ACS. TIMI score is 4, which puts him at 20% risk at 14 days for mortality. i-stat troponin negative x1, and EKG appears unchanged from previous but wander is present. Pt denies drug use. He will need to establish care with Cardiology here in [place] if he is going to be living here. He is currently on ASA 81mg  and a statin. Pt states that he was changed to Apixaban from Plavix, but will have his son check the medications and let us know today. - Repeat EKG - Troponins q6 x3 - Continue ASA 81mg   - F/u on possible Apixaban use - Continue statin, atorvastatin 40mg  daily - Checking lipid panel - Checking UDS  #Weight loss: Pt with 36lb weight loss since July. Possibly in the setting of his ESRD, but he does have a reported h/o "significanlty enlarged prostate" with possible elevation in his PSA and hematuria, and in the setting of his weight loss is concerning for possible malignancy. On exam prostate with mild nodularity, tenderness, but not boggy. Pt with h/o tobacco abuse but quit 7 years ago after smoking 1ppwk. With his h/o hematuria, smoking history, HTN, and DM2, does raise concerns for possible renal cell carcinoma in the setting of this weight loss. - Checking UA - Checking HIV - Consider checking PSA - Pt to ask son who the MD he saw in [place] for his prostate so we can obtain records  # ESRD (end stage renal disease): HD MWF. Last session 11/05/14. Volume overloaded today. Needs HD.   # Hypertension: H/o HTN, on CCB and BB. BP elevated on admission in the setting of volume overload. Suspect BP will improve with fluid removal with HD. - Continue Amlodipine 10mg  daily - Continue Carvedilol 50mg  BID  # CAD (coronary artery disease) of artery bypass graft: H/o 3 vessel stenting per pt, done at [facility]. No recent Cardiology f/u, and last chest pain admission in July with  negative workup. TTE did reveal grade 2 diastolic dysfunction but no systolic dysfunction.  - Please see above for plan  #Diabetes mellitus type 2, insulin  dependent: supposed to be on Lantus 25u with Novolog mealtime coverage, but the pt states that he has lost so much weight recently (36, that he is only using 10-15u Lantus and many times does not use the Novolog.  - Holding Lantus - SSI sensitive - Checking A1c  #Depression: Stable. Normal mood and affect on exam. Pt on Effexor and Amitriptyline. - Continue Effexor 37.5mg  BID - Continue Amitriptyline 100mg  qhs   Dispo: Disposition is deferred at this time, awaiting improvement of current medical problems.   The patient does not have a current PCP ([name]) and may need an [place] follow-up appointment after discharge.  The patient does not have transportation limitations that hinder transportation to clinic appointments.  Signed: Otho Bellows, MD 11/09/2014, 9:41 AM

## 2014-11-09 NOTE — Procedures (Signed)
Pt seen on HD.  Ap 130 Vp 190 BFR 400.  Pulling 5.3 KG.

## 2014-11-09 NOTE — Consult Note (Signed)
[name] [name] is an 59 y.o. male referred by Dr Eppie Gibson   Chief Complaint: ESRD, Sec HPTH, Anemia HPI: 59 yo BM with ESRD sec DM/HTN who was on HD in [place] MWF and in [place] yest trying to move to new place.  He was in the process of transferring to [facility] with plans to start next week.  Yest felt SOB and had CP, mostly when he coughed and thus went to [facility] center thinking he could be dialyzed.  He then presented to ER.  Lat HD was last Fri  MWF, 4hr, BFR 500, DW 90kg, 2K/3Ca, Epo 1600u, calcitriol .77mcg  Past Medical History  Diagnosis Date  . Hypertension   . Renal disorder   . Diabetes mellitus without complication     History reviewed. No pertinent past surgical history.  No family history on file. Social History:  reports that he has never smoked. He has never used smokeless tobacco. He reports that he does not drink alcohol or use illicit drugs.  Allergies:  Allergies  Allergen Reactions  . Enalapril Hives    Medications Prior to Admission  Medication Sig Dispense Refill  . amitriptyline (ELAVIL) 100 MG tablet Take 100 mg by mouth at bedtime.    [name] amLODipine (NORVASC) 10 MG tablet Take 10 mg by mouth daily.    [name] aspirin EC 81 MG tablet Take 81 mg by mouth daily.    [name] atorvastatin (LIPITOR) 40 MG tablet Take 40 mg by mouth daily.    . carvedilol (COREG) 25 MG tablet Take 50 mg by mouth 2 (two) times daily with a meal.    . cinacalcet (SENSIPAR) 30 MG tablet Take 60 mg by mouth at bedtime.    . insulin glargine (LANTUS) 100 UNIT/ML injection Inject 25 Units into the skin at bedtime.    . Ipratropium-Albuterol (COMBIVENT RESPIMAT) 20-100 MCG/ACT AERS respimat Inhale 2 puffs into the lungs every 6 (six) hours as needed for wheezing.    . nitroGLYCERIN (NITROSTAT) 0.4 MG SL tablet Place 0.4 mg under the tongue every 5 (five) minutes as needed for chest pain.    . pregabalin (LYRICA) 100 MG capsule Take 100 mg by mouth 3 (three) times daily.    . sevelamer  carbonate (RENVELA) 800 MG tablet Take 1,600-2,400 mg by mouth 3 (three) times daily with meals. *takes 2400mg  in the morning, 1600mg  midday, and 2400mg  at night*    . venlafaxine (EFFEXOR) 37.5 MG tablet Take 37.5 mg by mouth 2 (two) times daily.    . clopidogrel (PLAVIX) 75 MG tablet Take 75 mg by mouth daily.    . insulin aspart (NOVOLOG) 100 UNIT/ML injection Inject 15-20 Units into the skin 3 (three) times daily before meals.       Lab Results: UA: 250 glucose, 100protein   Recent Labs  11/09/14 0608  HGB 13.6  HCT 40.0   BMET  Recent Labs  11/09/14 0608  NA 140  K 4.9  CL 102  GLUCOSE 109*  BUN 60*  CREATININE 13.00*   LFT  Recent Labs  11/09/14 0946  PROT 7.9  ALBUMIN 4.0  AST 15  ALT 12  ALKPHOS 77  BILITOT 0.5  BILIDIR <0.1  IBILI NOT CALCULATED   Dg Chest Port 1 View  11/09/2014   CLINICAL DATA:  Chest pain and difficulty breathing  EXAM: PORTABLE CHEST - 1 VIEW  COMPARISON:  April 26, 2014  FINDINGS: There is stable elevation of the left hemidiaphragm. The interstitium is  prominent, probably representing chronic interstitial edema. Heart is mildly enlarged, and there is slight pulmonary venous hypertension. No adenopathy. No airspace consolidation. No bone lesions.  IMPRESSION: Findings felt to be consistent with a degree of chronic congestive heart failure. No appreciable change from prior study. No airspace consolidation.   Electronically Signed   By: Lowella Grip M.D.   On: 11/09/2014 07:02    ROS: No change in vision No SOB currently on O2 CO CP when he coughs No abd pain Some numbness rt & lt index finger Chronic pain knees No dysuria   PHYSICAL EXAM: Blood pressure 174/97, pulse 93, temperature 97.6 F (36.4 C), temperature source Oral, resp. rate 22, height 6' (1.829 m), weight 93.441 kg (206 lb), SpO2 98 %. HEENT: PERRLA EOMI NECK:No JVD LUNGS:few faint basilar crackles CARDIAC:RRR w 1/6 systolic M ABD:+ BS NTND No HSM EXT:No edema   LUA AVF + bruit NEURO:CNI Ox3 no asterixis  Assessment: 1. ESRD 2. Sec HPTH 3. Anemia 4. HTN 5. DM 6. MS chest pain PLAN: 1. [place] HD today 2. Check PTH 3. Check PO4 4. Start hectorol 5. Start renavite 6. Start aranesp depending on results of CBC 7. Change sensipar to 90mg  q d 8. phoslo 2 AC 9. He has an outpt spot at [facility] 2nd shift and can go from renal standpoint when done with any medical evaluation   Meklit Cotta T 11/09/2014, 12:46 PM

## 2014-11-09 NOTE — ED Provider Notes (Signed)
CSN: [id number]     Arrival date & time 11/09/14  0546 History   None    Chief Complaint  Patient presents with  . Shortness of Breath     (Consider location/radiation/quality/duration/timing/severity/associated sxs/prior Treatment) HPI Complains of shortness of breath gradual onset 5 PM yesterday. Symptoms accompanied by pleuritic chest pain which is mild and constant, worse with deep inspiration not improved with anything. no fever and no cough. He missed his dialysis session yesterday. No treatment prior to coming here. No other associated symptoms Past Medical History  Diagnosis Date  . Hypertension   . Renal disorder    History reviewed. No pertinent past surgical history. No family history on file. History  Substance Use Topics  . Smoking status: Never Smoker   . Smokeless tobacco: Not on file  . Alcohol Use: No    Review of Systems  Constitutional: Negative.   HENT: Negative.   Respiratory: Positive for shortness of breath.   Cardiovascular: Positive for chest pain.  Gastrointestinal: Negative.   Musculoskeletal: Negative.   Skin: Negative.   Neurological: Negative.   Psychiatric/Behavioral: Negative.   All other systems reviewed and are negative.     Allergies  Enalapril  Home Medications   Prior to Admission medications   Medication Sig Start Date End Date Taking? Authorizing Provider  amitriptyline (ELAVIL) 100 MG tablet Take 100 mg by mouth at bedtime.    Historical Provider, MD  amLODipine (NORVASC) 10 MG tablet Take 10 mg by mouth daily.    Historical Provider, MD  aspirin EC 81 MG tablet Take 81 mg by mouth daily.    Historical Provider, MD  atorvastatin (LIPITOR) 40 MG tablet Take 40 mg by mouth daily.    Historical Provider, MD  carvedilol (COREG) 25 MG tablet Take 50 mg by mouth 2 (two) times daily with a meal.    Historical Provider, MD  cinacalcet (SENSIPAR) 30 MG tablet Take 60 mg by mouth at bedtime.    Historical Provider, MD  clopidogrel  (PLAVIX) 75 MG tablet Take 75 mg by mouth daily.    Historical Provider, MD  insulin aspart (NOVOLOG) 100 UNIT/ML injection Inject 15-20 Units into the skin 3 (three) times daily before meals.    Historical Provider, MD  insulin glargine (LANTUS) 100 UNIT/ML injection Inject 25 Units into the skin at bedtime.    Historical Provider, MD  Ipratropium-Albuterol (COMBIVENT RESPIMAT) 20-100 MCG/ACT AERS respimat Inhale 2 puffs into the lungs every 6 (six) hours as needed for wheezing.    Historical Provider, MD  nitroGLYCERIN (NITROSTAT) 0.4 MG SL tablet Place 0.4 mg under the tongue every 5 (five) minutes as needed for chest pain.    Historical Provider, MD  pregabalin (LYRICA) 100 MG capsule Take 100 mg by mouth 3 (three) times daily.    Historical Provider, MD  sevelamer carbonate (RENVELA) 800 MG tablet Take 1,600-2,400 mg by mouth 3 (three) times daily with meals. *takes 2400mg  in the morning, 1600mg  midday, and 2400mg  at night*    Historical Provider, MD  venlafaxine (EFFEXOR) 37.5 MG tablet Take 37.5 mg by mouth 2 (two) times daily.    Historical Provider, MD   BP 185/100 mmHg  Pulse 106  Temp(Src) 97.8 F (36.6 C) (Oral)  Resp 31  Ht 6' (1.829 m)  Wt 206 lb (93.441 kg)  BMI 27.93 kg/m2  SpO2 100% Physical Exam  Constitutional: He appears well-developed and well-nourished.  HENT:  Head: Normocephalic and atraumatic.  Eyes: Conjunctivae are normal. Pupils are equal,  round, and reactive to light.  Neck: Neck supple. No tracheal deviation present. No thyromegaly present.  Cardiovascular: Normal rate and regular rhythm.   No murmur heard. Pulmonary/Chest: Effort normal.  Rales right base  Abdominal: Soft. Bowel sounds are normal. He exhibits no distension. There is no tenderness.  Musculoskeletal: Normal range of motion. He exhibits no edema or tenderness.  Neurological: He is alert. Coordination normal.  Skin: Skin is warm and dry. No rash noted.  Psychiatric: He has a normal mood and  affect.  Nursing note and vitals reviewed.   ED Course  Procedures (including critical care time) Labs Review Labs Reviewed - No data to display  Imaging Review No results found.   EKG Interpretation   Date/Time:  Tuesday November 09 2014 05:54:37 EST Ventricular Rate:  107 PR Interval:  134 QRS Duration: 107 QT Interval:  399 QTC Calculation: 532 R Axis:   73 Text Interpretation:  Sinus tachycardia Nonspecific T abnormalities,  lateral leads Prolonged QT interval No significant change since last  tracing Confirmed by Winfred Leeds  MD, Vincy Feliz [phone]) on 11/09/2014 6:04:20 AM     Chest x-ray viewed by me Nitroglycerin ointment placed on patient At 8 AM he is resting comfortably lying in bed, no respiratory distress after treatment with nitroglycerin ointment and oxygen. Results for orders placed or performed during the hospital encounter of 11/09/14  I-stat chem 8, ed  Result Value Ref Range   Sodium 140 135 - 145 mmol/L   Potassium 4.9 3.5 - 5.1 mmol/L   Chloride 102 96 - 112 mmol/L   BUN 60 (H) 6 - 23 mg/dL   Creatinine, Ser 13.00 (H) 0.50 - 1.35 mg/dL   Glucose, Bld 109 (H) 70 - 99 mg/dL   Calcium, Ion 0.98 (L) 1.12 - 1.23 mmol/L   TCO2 22 0 - 100 mmol/L   Hemoglobin 13.6 13.0 - 17.0 g/dL   HCT 40.0 39.0 - 52.0 %  I-stat troponin, ED  Result Value Ref Range   Troponin i, poc 0.02 0.00 - 0.08 ng/mL   Comment 3           Dg Chest Port 1 View  11/09/2014   CLINICAL DATA:  Chest pain and difficulty breathing  EXAM: PORTABLE CHEST - 1 VIEW  COMPARISON:  April 26, 2014  FINDINGS: There is stable elevation of the left hemidiaphragm. The interstitium is prominent, probably representing chronic interstitial edema. Heart is mildly enlarged, and there is slight pulmonary venous hypertension. No adenopathy. No airspace consolidation. No bone lesions.  IMPRESSION: Findings felt to be consistent with a degree of chronic congestive heart failure. No appreciable change from prior study. No  airspace consolidation.   Electronically Signed   By: Lowella Grip M.D.   On: 11/09/2014 07:02    MDM  Spoke with Dr.Powell, nephrologist who will come to evaluate patient and arrange for hemodialysis Diagnosis #1congestive heart failure #2hypertension #3 noncompliance with hemodialysis  Final diagnoses:  None        Orlie Dakin, MD 11/09/14 [phone]

## 2014-11-09 NOTE — ED Notes (Signed)
Ordered pt. Renal diet breakfast tray.

## 2014-11-09 NOTE — ED Notes (Signed)
Patient presents stating he has been moving and was not feeling good.  Tried to get into [facility] for dialysis yesterday but they were closed.  Started feeling SOB last yesterday afternoon.  Usually has dialysis M/W/F

## 2014-11-10 DIAGNOSIS — R0602 Shortness of breath: Secondary | ICD-10-CM | POA: Diagnosis not present

## 2014-11-10 DIAGNOSIS — E1122 Type 2 diabetes mellitus with diabetic chronic kidney disease: Secondary | ICD-10-CM

## 2014-11-10 DIAGNOSIS — Z992 Dependence on renal dialysis: Secondary | ICD-10-CM

## 2014-11-10 DIAGNOSIS — I120 Hypertensive chronic kidney disease with stage 5 chronic kidney disease or end stage renal disease: Secondary | ICD-10-CM

## 2014-11-10 DIAGNOSIS — E8779 Other fluid overload: Secondary | ICD-10-CM | POA: Diagnosis not present

## 2014-11-10 DIAGNOSIS — F329 Major depressive disorder, single episode, unspecified: Secondary | ICD-10-CM

## 2014-11-10 DIAGNOSIS — N186 End stage renal disease: Secondary | ICD-10-CM

## 2014-11-10 DIAGNOSIS — R634 Abnormal weight loss: Secondary | ICD-10-CM

## 2014-11-10 DIAGNOSIS — Z794 Long term (current) use of insulin: Secondary | ICD-10-CM

## 2014-11-10 LAB — HEMOGLOBIN A1C
Hgb A1c MFr Bld: 5.5 % (ref 4.8–5.6)
Mean Plasma Glucose: 111 mg/dL

## 2014-11-10 LAB — BASIC METABOLIC PANEL
ANION GAP: 11 (ref 5–15)
BUN: 44 mg/dL — ABNORMAL HIGH (ref 6–23)
CO2: 28 mmol/L (ref 19–32)
CREATININE: 9.62 mg/dL — AB (ref 0.50–1.35)
Calcium: 8.6 mg/dL (ref 8.4–10.5)
Chloride: 99 mmol/L (ref 96–112)
GFR, EST AFRICAN AMERICAN: 6 mL/min — AB (ref >90)
GFR, EST NON AFRICAN AMERICAN: 5 mL/min — AB (ref >90)
GLUCOSE: 118 mg/dL — AB (ref 70–99)
Potassium: 4.5 mmol/L (ref 3.5–5.1)
Sodium: 138 mmol/L (ref 135–145)

## 2014-11-10 LAB — CBC
HEMATOCRIT: 31.6 % — AB (ref 39.0–52.0)
HEMOGLOBIN: 10.6 g/dL — AB (ref 13.0–17.0)
MCH: 26.9 pg (ref 26.0–34.0)
MCHC: 33.5 g/dL (ref 30.0–36.0)
MCV: 80.2 fL (ref 78.0–100.0)
Platelets: 203 10*3/uL (ref 150–400)
RBC: 3.94 MIL/uL — ABNORMAL LOW (ref 4.22–5.81)
RDW: 15.2 % (ref 11.5–15.5)
WBC: 6.2 10*3/uL (ref 4.0–10.5)

## 2014-11-10 LAB — HIV ANTIBODY (ROUTINE TESTING W REFLEX): HIV SCREEN 4TH GENERATION: NONREACTIVE

## 2014-11-10 LAB — GLUCOSE, CAPILLARY
Glucose-Capillary: 179 mg/dL — ABNORMAL HIGH (ref 70–99)
Glucose-Capillary: 109 mg/dL — ABNORMAL HIGH (ref 70–99)

## 2014-11-10 LAB — TSH: TSH: 0.43 u[IU]/mL (ref 0.350–4.500)

## 2014-11-10 LAB — TROPONIN I: TROPONIN I: 0.07 ng/mL — AB (ref <0.031)

## 2014-11-10 LAB — MAGNESIUM: MAGNESIUM: 2.2 mg/dL (ref 1.5–2.5)

## 2014-11-10 MED ORDER — CALCIUM ACETATE 667 MG PO CAPS: 1334.0000 mg | ORAL_CAPSULE | Freq: Three times a day (TID) | ORAL | Status: DC

## 2014-11-10 NOTE — Progress Notes (Signed)
Internal Medicine Attending Admission Note Date: 11/10/2014  Patient name: Duluth Nelli Medical record number: [id number] Date of birth: 12-13-1955 Age: 59 y.o. Gender: male  I saw and evaluated the patient. I reviewed the resident's note and I agree with the resident's findings and plan as documented in the resident's note.  Chief Complaint(s): Shortness of breath  History - key components related to admission:  Mr. Malina is a 59 year old man with a history of diabetes, hypertension, grade 2 diastolic dysfunction, coronary artery disease status post stenting 3 per report, and end-stage renal disease on chronic hemodialysis who presents with progressive shortness of breath. Of note, he gets his medical care in [place] but has been moving in between [place] in [place]. He missed his most recent hemodialysis session on Monday. When in [place] he noted some shortness of breath and tried to get hemodialysis in [place] but was turned away as no dialysis orders were available. He therefore presented to the emergency department for his shortness of breath and was found to have volume overload consistent with missing hemodialysis session for volume control. He also noted a cough with associated chest pressure. After receiving a hemodialysis session his dyspnea had improved and he was at baseline feeling ready for discharge home. Outpatient hemodialysis is now been set up in [place] on a Tuesday Thursday Saturday schedule. The pressure has resolved since hemodialysis.  Physical Exam - key components related to admission:  Filed Vitals:   11/09/14 1811 11/09/14 2039 11/10/14 0452 11/10/14 1011  BP: 162/94 149/78 139/68 134/82  Pulse: 81 83 83 91  Temp: 97.4 F (36.3 C) 98.7 F (37.1 C) 98.2 F (36.8 C) 98.4 F (36.9 C)  TempSrc: Oral Oral Oral Oral  Resp: 23 20 18 18   Height:      Weight: 201 lb 11.5 oz (91.5 kg) 214 lb 15.2 oz (97.5 kg)    SpO2: 93% 95% 96% 95%   Gen.: Well-developed,  well-nourished, man lying comfortably flat in bed in no acute distress. Lungs: Clear to auscultation bilaterally without wheezes, rhonchi, or rales Heart: Regular rate and rhythm with a continuous machinelike murmur likely related to his graft. Chest wall: Chest pain nonreproducible with palpation. Extremities: Without edema  Lab results:  Basic Metabolic Panel:  Recent Labs  11/09/14 0608 11/09/14 1230 11/10/14 0728  NA 140  --  138  K 4.9  --  4.5  CL 102  --  99  CO2  --   --  28  GLUCOSE 109*  --  118*  BUN 60*  --  44*  CREATININE 13.00*  --  9.62*  CALCIUM  --   --  8.6  MG  --  2.2 2.2  PHOS  --  10.1*  --    Liver Function Tests:  Recent Labs  11/09/14 0946  AST 15  ALT 12  ALKPHOS 77  BILITOT 0.5  PROT 7.9  ALBUMIN 4.0   CBC:  Recent Labs  11/09/14 0608 11/10/14 0728  WBC  --  6.2  HGB 13.6 10.6*  HCT 40.0 31.6*  MCV  --  80.2  PLT  --  203   Cardiac Enzymes:  Recent Labs  11/09/14 1230 11/09/14 1930 11/09/14 2310  TROPONINI 0.03 0.06* 0.07*   CBG:  Recent Labs  11/09/14 1136 11/09/14 2035 11/10/14 0811  GLUCAP 88 145* 179*   Hemoglobin A1C:  Recent Labs  11/09/14 1001  HGBA1C 5.5   Fasting Lipid Panel:  Recent Labs  11/09/14 1001  CHOL  97  HDL 23*  LDLCALC 57  TRIG 84  CHOLHDL 4.2   Thyroid Function Tests:  Recent Labs  11/10/14 0728  TSH 0.430   Urine Drug Screen:  Unremarkable  Urinalysis:  Glucose 250, hemoglobin small, protein 100, nitrite negative, leukocyte negative  Misc. Labs:  HIV nonreactive Phosphorus 10.1 Magnesium 2.2  Imaging results:  Dg Chest Port 1 View  11/09/2014   CLINICAL DATA:  Chest pain and difficulty breathing  EXAM: PORTABLE CHEST - 1 VIEW  COMPARISON:  April 26, 2014  FINDINGS: There is stable elevation of the left hemidiaphragm. The interstitium is prominent, probably representing chronic interstitial edema. Heart is mildly enlarged, and there is slight pulmonary venous  hypertension. No adenopathy. No airspace consolidation. No bone lesions.  IMPRESSION: Findings felt to be consistent with a degree of chronic congestive heart failure. No appreciable change from prior study. No airspace consolidation.   Electronically Signed   By: Lowella Grip M.D.   On: 11/09/2014 07:02   Other results:  EKG: Normal sinus tachycardia at 105 bpm, normal axis, prolonged QT, Q-wave in aVL which is new, no significant LVH by voltage, good R wave progression, no ST or T wave changes.  Assessment & Plan by Problem:  Mr. Leachman is a 59 year old man with multiple chronic medical problems including end-stage renal disease requiring hemodialysis who presents with increasing shortness of breath after missing a hemodialysis session. On presentation his examination was consistent with volume overload likely related to missing the hemodialysis session. His symptoms have completely resolved with hemodialysis and 3-1/2 L volume removal. He did have some chest pressure and had a mild bump in his troponins. This was likely related to his end-stage renal disease more so than true cardiac ischemia although with his history of renal disease and coronary artery disease the possibility remains he may have had some angina. As he is transitioning his life and health care from [place] to [place] the importance of establishing consistent healthcare in our area will be critical for him to appreciate.  1) Stage V chronic renal failure requiring hemodialysis: His symptoms of volume overload resolved with a hemodialysis run. He has now been established in an outpatient hemodialysis center in [place]. He will be discharged home with hemodialysis scheduled on Tuesdays, Thursdays, and Saturdays.  2) Coronary artery disease: It was recommended that he contact his local primary care provider and ask for a referral to outpatient cardiology for ongoing management of his coronary artery disease. There are some  questions as to the specifics on his stenting as well as his therapy and these are to be sorted out through cardiology.  3) Disposition: He will be discharged home today now that his symptoms have completely resolved with hemodialysis. Outpatient hemodialysis has been established and he is to follow-up with his local primary care provider who can refer him to outpatient cardiology for further management of his coronary artery disease.

## 2014-11-10 NOTE — Discharge Summary (Signed)
Name: [name] [name] MRN: [id number] DOB: Sep 14, 1956 59 y.o. PCP: [name]  Date of Admission: 11/09/2014  5:47 AM Date of Discharge: 11/10/2014 Attending Physician: Karren Cobble, MD  Discharge Diagnosis: 1.  Principal Problem:   Volume overload Active Problems:   Chest pain   ESRD (end stage renal disease)   Hypertension   CAD (coronary artery disease) of artery bypass graft   Diabetes mellitus type 2, insulin dependent   Hyperphosphatemia  Discharge Medications:   Medication List    ASK your doctor about these medications        amitriptyline 100 MG tablet  Commonly known as:  ELAVIL  Take 100 mg by mouth at bedtime.     amLODipine 10 MG tablet  Commonly known as:  NORVASC  Take 10 mg by mouth daily.     aspirin EC 81 MG tablet  Take 81 mg by mouth daily.     atorvastatin 40 MG tablet  Commonly known as:  LIPITOR  Take 40 mg by mouth daily.     carvedilol 25 MG tablet  Commonly known as:  COREG  Take 50 mg by mouth 2 (two) times daily with a meal.     cinacalcet 30 MG tablet  Commonly known as:  SENSIPAR  Take 60 mg by mouth at bedtime.     clopidogrel 75 MG tablet  Commonly known as:  PLAVIX  Take 75 mg by mouth daily.     COMBIVENT RESPIMAT 20-100 MCG/ACT Aers respimat  Generic drug:  Ipratropium-Albuterol  Inhale 2 puffs into the lungs every 6 (six) hours as needed for wheezing.     insulin aspart 100 UNIT/ML injection  Commonly known as:  novoLOG  Inject 15-20 Units into the skin 3 (three) times daily before meals.     insulin glargine 100 UNIT/ML injection  Commonly known as:  LANTUS  Inject 25 Units into the skin at bedtime.     nitroGLYCERIN 0.4 MG SL tablet  Commonly known as:  NITROSTAT  Place 0.4 mg under the tongue every 5 (five) minutes as needed for chest pain.     pregabalin 100 MG capsule  Commonly known as:  LYRICA  Take 100 mg by mouth 3 (three) times daily.     sevelamer carbonate 800 MG tablet  Commonly known  as:  RENVELA  Take 1,600-2,400 mg by mouth 3 (three) times daily with meals. *takes 2400mg  in the morning, 1600mg  midday, and 2400mg  at night*     venlafaxine 37.5 MG tablet  Commonly known as:  EFFEXOR  Take 37.5 mg by mouth 2 (two) times daily.        Disposition and follow-up:   [name] [name] was discharged from [facility] in Good condition.  At the hospital follow up visit please address:  1.  Cardiology referral- Patient recommended to get cardiology referral from PCP as he needs to establish care in [place]. Patient understands. Apixaban- Unclear why/if this patient is on this medication. Will need to clarify with PCP.  Significant unintentional weight loss: Will need further work up through his PCP given his smoking hx and questionable previously elevated PSA and hematuria.  Insulin regimen- Patient has not been taking insulin due to weight loss. Needs to be discussed with PCP and insulin therapy adjusted.   All of these concerns were placed on the patient's discharge instructions and the patient understands the importance of following up with his PCP in the next 1 week.  2.  Labs / imaging needed at time of follow-up: CBC, bmet, PSA  3.  Pending labs/ test needing follow-up: None  Follow-up Appointments:   Discharge Instructions:   Consultations: Treatment Team:  Windy Kalata, MD  Procedures Performed:  Dg Chest Port 1 View  11/09/2014   CLINICAL DATA:  Chest pain and difficulty breathing  EXAM: PORTABLE CHEST - 1 VIEW  COMPARISON:  April 26, 2014  FINDINGS: There is stable elevation of the left hemidiaphragm. The interstitium is prominent, probably representing chronic interstitial edema. Heart is mildly enlarged, and there is slight pulmonary venous hypertension. No adenopathy. No airspace consolidation. No bone lesions.  IMPRESSION: Findings felt to be consistent with a degree of chronic congestive heart failure. No appreciable change from  prior study. No airspace consolidation.   Electronically Signed   By: Lowella Grip M.D.   On: 11/09/2014 07:02    Admission HPI: 59yo M w/ PMH CAD s/p stenting, DM2, HTN, grade 2 diastolic dysfunction. and ESRD on HD MWF presents with SOB after missing HD yesterday. He states that he has been living between [place] and [place] and receiving medical care in [place], but is now planning to stay in [place]. He has been getting his HD in [place], but was here in town yesterday and decided to go over to the dialysis center in [place] for his HD session. However, the center did not have orders for the patient, and he was unable to be dialyzed. He states that he began to develop SOB and chest pressure/pain last night and decided to go to the ED this morning. His chest pressure/pain is better lying on his back and is worse with deep inspiration. Nitro paste was applied to his chest on arrival to the ED, which he states has improved his pressure/pain. He denies fevers or cough, but does endorse nausea and vomiting this morning. He does make some urine.   Of note, he does have a h/o CAD s/p approximately 3 stents, per patient. He was previously seen by Cardiology at [facility], but was admitted 04/2014 with chest pain. A Lexiscan was performed and was negative for ischemic changes, TTE w/ normal systolic function, EF [id number], and grade 2 diastolic dysfunction. He was reccommended to f/u with Cardiology here in [place] but has not done so. He does note that an MD in [place], not a Cardiologist changed him from Plavix to what he thinks is Apixaban 2/2 hematuria and an enlarged prostate.   Hospital Course by problem list: Principal Problem:   Volume overload Active Problems:   Chest pain   ESRD (end stage renal disease)   Hypertension   CAD (coronary artery disease) of artery bypass graft   Diabetes mellitus type 2, insulin dependent   Hyperphosphatemia   Dyspnea and Chest Pain Secondary to  Volume Overload: Patient presented with chest pain and dyspnea that improved with hemodialysis and removal of ~ 3L of fluid. He did have some chest pressure and had a mild increase in his troponins, but this was likely related to his ESRD more so than true cardiac ischemia. His EKG showed normal sinus tachycardia at 105 bpm, normal axis, prolonged QT, Q-wave in aVL which is new, no significant LVH by voltage, good R wave progression, no ST or T wave changes. It is possible given his ESRD and cardiac history that he experienced some angina, which is why it is important for him to establish care with a cardiologist here in [place] if he wishes to continue his  medical care here.  He will need a referral to cardiology through his PCP. There was also some question of why/if he was on Apixaban. He was recommended to not take this medication until this can be sorted out by his PCP. His HbA1c and lipid panel were unremarkable, except for a low HDL. He was continued on his home aspirin, statin, coreg, and amlodipine, and recommended to continue taking these medications upon discharge.   Unintentional Weight Loss: Patient with 36 lb weight loss since July 2015. Unclear etiology. Could be due to ESRD, but also has history of BPH and questionable elevation in PSA and hematuria concerning for malignancy. He also has a smoking history. He does not have hematuria currently. On exam, prostate has mild nodularity and mild tenderness. No malignancy identified on CXR. HIV negative. Patient denies night sweats, fever/chills, decreased appetite. He will need further work up for possible malignancy, including possible renal cell carcinoma and prostate cancer. He would also benefit from a colonoscopy if he has not had this procedure done already. Patient was instructed to follow up with his PCP for further work up.   ESRD on HD MWF: Patient has been set up with [facility] in [place] for HD on TThS schedule.  HTN: Patient  remained normotensive during his hospitalization. He was continued on his home Amlodipine 10 mg daily and Coreg 50 mg BID.   Type 2 DM: Patient has not been taking Lantus and Novolog due to his weight loss (see above). His blood sugars remained in the 88-179 range with no Lantus coverage and minimal Novolog mealtime coverage. He was recommended to stop his Lantus once discharged until he follows up with his PCP. He was also recommended to continue his Novolog mealtime coverage with a sliding scale. He will need adjustments in his insulin therapy through his PCP to avoid hyperglycemia.   Depression: Patient was continued on his home Effexor 37.5 mg BID and Amitriptyline 100 mg QHS.   Discharge Vitals:   BP 134/82 mmHg  Pulse 91  Temp(Src) 98.4 F (36.9 C) (Oral)  Resp 18  Ht 6' (1.829 m)  Wt 214 lb 15.2 oz (97.5 kg)  BMI 29.15 kg/m2  SpO2 95% Physical Exam General: alert, sitting up in bed, NAD HEENT: [place]/AT, EOMI, mucus membranes moist CV: RRR, 3/6 continuous murmur Pulm: CTA bilaterally, breaths non-labored Abd: BS+, soft, nontender Ext: warm, no edema, left upper extremity AVG with +bruit Neuro: alert and oriented x 3, no focal deficits  Discharge Labs:  Results for orders placed or performed during the hospital encounter of 11/09/14 (from the past 24 hour(s))  Troponin I     Status: None   Collection Time: 11/09/14 12:30 PM  Result Value Ref Range   Troponin I 0.03 <0.031 ng/mL  Magnesium     Status: None   Collection Time: 11/09/14 12:30 PM  Result Value Ref Range   Magnesium 2.2 1.5 - 2.5 mg/dL  Phosphorus     Status: Abnormal   Collection Time: 11/09/14 12:30 PM  Result Value Ref Range   Phosphorus 10.1 (H) 2.3 - 4.6 mg/dL  Troponin I     Status: Abnormal   Collection Time: 11/09/14  7:30 PM  Result Value Ref Range   Troponin I 0.06 (H) <0.031 ng/mL  Glucose, capillary     Status: Abnormal   Collection Time: 11/09/14  8:35 PM  Result Value Ref Range    Glucose-Capillary 145 (H) 70 - 99 mg/dL  Troponin I  Status: Abnormal   Collection Time: 11/09/14 11:10 PM  Result Value Ref Range   Troponin I 0.07 (H) <0.031 ng/mL  Basic metabolic panel     Status: Abnormal   Collection Time: 11/10/14  7:28 AM  Result Value Ref Range   Sodium 138 135 - 145 mmol/L   Potassium 4.5 3.5 - 5.1 mmol/L   Chloride 99 96 - 112 mmol/L   CO2 28 19 - 32 mmol/L   Glucose, Bld 118 (H) 70 - 99 mg/dL   BUN 44 (H) 6 - 23 mg/dL   Creatinine, Ser 9.62 (H) 0.50 - 1.35 mg/dL   Calcium 8.6 8.4 - 10.5 mg/dL   GFR calc non Af Amer 5 (L) >90 mL/min   GFR calc Af Amer 6 (L) >90 mL/min   Anion gap 11 5 - 15  CBC     Status: Abnormal   Collection Time: 11/10/14  7:28 AM  Result Value Ref Range   WBC 6.2 4.0 - 10.5 K/uL   RBC 3.94 (L) 4.22 - 5.81 MIL/uL   Hemoglobin 10.6 (L) 13.0 - 17.0 g/dL   HCT 31.6 (L) 39.0 - 52.0 %   MCV 80.2 78.0 - 100.0 fL   MCH 26.9 26.0 - 34.0 pg   MCHC 33.5 30.0 - 36.0 g/dL   RDW 15.2 11.5 - 15.5 %   Platelets 203 150 - 400 K/uL  Magnesium     Status: None   Collection Time: 11/10/14  7:28 AM  Result Value Ref Range   Magnesium 2.2 1.5 - 2.5 mg/dL  TSH     Status: None   Collection Time: 11/10/14  7:28 AM  Result Value Ref Range   TSH 0.430 0.350 - 4.500 uIU/mL  Glucose, capillary     Status: Abnormal   Collection Time: 11/10/14  8:11 AM  Result Value Ref Range   Glucose-Capillary 179 (H) 70 - 99 mg/dL   Comment 1 Notify RN    Comment 2 Documented in Chart     Signed: Albin Felling, MD 11/10/2014, 11:39 AM    Services Ordered on Discharge: None Equipment Ordered on Discharge: None

## 2014-11-10 NOTE — Discharge Instructions (Signed)
It was a pleasure taking care of you, [name] [name].  It is very important for you to follow up with your primary care doctor in the next week. Please call to make an appointment as soon as possible.   You need follow up for weight loss, insulin therapy for your diabetes, as well as whether you need to be on Apixaban.   Take care, Dr. Arcelia Jew

## 2014-11-10 NOTE — Progress Notes (Signed)
Pt provided with discharge information including instruction on follow up appointments, medications, where to pick up new prescriptions, and recommended diet and activity levels. Pt verbalized understanding of all information. IV was dc'd without complication. VSS. Pt provided with bus pass per his request and escorted to bus stop by NT.   Tyna Jaksch, RN

## 2014-11-10 NOTE — Progress Notes (Addendum)
Subjective: Patient reports his chest pain and dyspnea have improved after HD. Troponins negative and EKG unchanged.   Objective: Vital signs in last 24 hours: Filed Vitals:   11/09/14 1811 11/09/14 2039 11/10/14 0452 11/10/14 1011  BP: 162/94 149/78 139/68 134/82  Pulse: 81 83 83 91  Temp: 97.4 F (36.3 C) 98.7 F (37.1 C) 98.2 F (36.8 C) 98.4 F (36.9 C)  TempSrc: Oral Oral Oral Oral  Resp: 23 20 18 18   Height:      Weight: 201 lb 11.5 oz (91.5 kg) 214 lb 15.2 oz (97.5 kg)    SpO2: 93% 95% 96% 95%   Weight change: 4 lb 15.7 oz (2.259 kg)  Intake/Output Summary (Last 24 hours) at 11/10/14 1113 Last data filed at 11/10/14 0900  Gross per 24 hour  Intake    720 ml  Output   3813 ml  Net  -3093 ml   Physical Exam General: alert, sitting up in bed, NAD HEENT: [place]/AT, EOMI, mucus membranes moist CV: RRR, 3/6 continuous murmur Pulm: CTA bilaterally, breaths non-labored Abd: BS+, soft, nontender Ext: warm, no edema, left upper extremity AVG with +bruit Neuro: alert and oriented x 3, no focal deficits  Lab Results: Basic Metabolic Panel:  Recent Labs Lab 11/09/14 0608 11/09/14 1230 11/10/14 0728  NA 140  --  138  K 4.9  --  4.5  CL 102  --  99  CO2  --   --  28  GLUCOSE 109*  --  118*  BUN 60*  --  44*  CREATININE 13.00*  --  9.62*  CALCIUM  --   --  8.6  MG  --  2.2 2.2  PHOS  --  10.1*  --    Liver Function Tests:  Recent Labs Lab 11/09/14 0946  AST 15  ALT 12  ALKPHOS 77  BILITOT 0.5  PROT 7.9  ALBUMIN 4.0   CBC:  Recent Labs Lab 11/09/14 0608 11/10/14 0728  WBC  --  6.2  HGB 13.6 10.6*  HCT 40.0 31.6*  MCV  --  80.2  PLT  --  203   Cardiac Enzymes:  Recent Labs Lab 11/09/14 1230 11/09/14 1930 11/09/14 2310  TROPONINI 0.03 0.06* 0.07*   CBG:  Recent Labs Lab 11/09/14 1136 11/09/14 2035 11/10/14 0811  GLUCAP 88 145* 179*   Hemoglobin A1C:  Recent Labs Lab 11/09/14 1001  HGBA1C 5.5   Fasting Lipid Panel:  Recent  Labs Lab 11/09/14 1001  CHOL 97  HDL 23*  LDLCALC 57  TRIG 84  CHOLHDL 4.2   Thyroid Function Tests:  Recent Labs Lab 11/10/14 0728  TSH 0.430   Urine Drug Screen: Drugs of Abuse     Component Value Date/Time   LABOPIA NONE DETECTED 11/09/2014 0916   COCAINSCRNUR NONE DETECTED 11/09/2014 0916   LABBENZ NONE DETECTED 11/09/2014 0916   AMPHETMU NONE DETECTED 11/09/2014 0916   THCU NONE DETECTED 11/09/2014 0916   LABBARB NONE DETECTED 11/09/2014 0916    Urinalysis:  Recent Labs Lab 11/09/14 0916  COLORURINE YELLOW  LABSPEC 1.009  PHURINE 8.0  GLUCOSEU 250*  HGBUR SMALL*  BILIRUBINUR NEGATIVE  KETONESUR NEGATIVE  PROTEINUR 100*  UROBILINOGEN 0.2  NITRITE NEGATIVE  LEUKOCYTESUR NEGATIVE   Studies/Results: Dg Chest Port 1 View  11/09/2014   CLINICAL DATA:  Chest pain and difficulty breathing  EXAM: PORTABLE CHEST - 1 VIEW  COMPARISON:  April 26, 2014  FINDINGS: There is stable elevation of the left hemidiaphragm. The  interstitium is prominent, probably representing chronic interstitial edema. Heart is mildly enlarged, and there is slight pulmonary venous hypertension. No adenopathy. No airspace consolidation. No bone lesions.  IMPRESSION: Findings felt to be consistent with a degree of chronic congestive heart failure. No appreciable change from prior study. No airspace consolidation.   Electronically Signed   By: Lowella Grip M.D.   On: 11/09/2014 07:02   Medications: I have reviewed the patient's current medications. Scheduled Meds: . amitriptyline  100 mg Oral QHS  . amLODipine  10 mg Oral Daily  . aspirin EC  81 mg Oral Daily  . atorvastatin  40 mg Oral Daily  . calcium acetate  1,334 mg Oral TID WC  . carvedilol  50 mg Oral BID WC  . cinacalcet  90 mg Oral QPM  . [START ON 11/11/2014] doxercalciferol  2 mcg Intravenous Q T,Th,Sa-HD  . heparin  5,000 Units Subcutaneous 3 times per day  . insulin aspart  0-5 Units Subcutaneous QHS  . insulin aspart  0-9 Units  Subcutaneous TID WC  . multivitamin  1 tablet Oral QHS  . pregabalin  100 mg Oral TID  . sevelamer carbonate  2,400 mg Oral BID WC   And  . sevelamer carbonate  1,600 mg Oral Q lunch  . sodium chloride  3 mL Intravenous Q12H  . venlafaxine  37.5 mg Oral BID   Continuous Infusions:  PRN Meds:.acetaminophen, ipratropium-albuterol, ondansetron **OR** ondansetron (ZOFRAN) IV Assessment/Plan: Principal Problem:   Volume overload Active Problems:   Chest pain   ESRD (end stage renal disease)   Hypertension   CAD (coronary artery disease) of artery bypass graft   Diabetes mellitus type 2, insulin dependent   Hyperphosphatemia  Dyspnea and Chest Pain Secondary to Volume Overload: Patient notes his chest pain and dyspnea have improved after receiving HD. Troponins negative x 3 and EKG unchanged from previous, ACS ruled out. Patient will need to establish with a cardiologist here in [facility] if he continues to receive his medical care here. He states he has a PCP in [place] so will recommend for him to receive a referral to cardiology from PCP. HbA1c and lipid panel unremarkable except for low HDL. Unclear if/why on Apixaban. Will hold off on restarting this and defer to PCP.  - Nephrology consulted, appreciate recs and help with this patient - Continue ASA 81 mg daily - Continue statin - Patient to follow up with PCP  Weight Loss: Patient with 36 lb weight loss since July 2015. Could be due to ESRD, but also has history of BPH and questionable elevation in PSA and hematuria concerning for malignancy. He also has a smoking history. He does not have hematuria currently. On exam, prostate has mild nodularity and mild tenderness. No malignancy identified on CXR. Patient denies night sweats, fever/chills, decreased appetite.  - HIV pending - Will defer further work up for possible malignancy to PCP  ESRD on HD MWF: Patient has been set up with [facility] for HD on TThS schedule. Will start HD  tomorrow there.   HTN: BP controlled. - Continue Amlodipine 10 mg daily - Continue Coreg 50 mg BID  Type 2 DM: Patient has not been taking Lantus and Novolog due to weight loss. His blood sugars have been in 88-179 range with no Lantus and minimal Novolog mealtime coverage.  - Hold Lantus, will recommend to stop at discharge - Continue sensitive ISS, will recommend for patient to continue upon discharge to avoid hyperglycemia. Will need to  follow up with PCP to have insulin adjusted.   Depression:  - Continue Effexor 37.5 mg BID - Continue Amitriptyline 100 mg QHS  Diet: Renal/Carb modified  VTE Ppx: Heparin SQ Dispo: Discharge today  The patient does have a current PCP ([name]) and does need an [facility] hospital follow-up appointment after discharge.  The patient does not have transportation limitations that hinder transportation to clinic appointments.  .Services Needed at time of discharge: Y = Yes, Blank = No PT:   OT:   RN:   Equipment:   Other:     LOS: 1 day   Albin Felling, MD 11/10/2014, 11:13 AM

## 2014-11-10 NOTE — Progress Notes (Signed)
S: Breathing better.  Pain with cough is gone O:BP 139/68 mmHg  Pulse 83  Temp(Src) 98.2 F (36.8 C) (Oral)  Resp 18  Ht 6' (1.829 m)  Wt 97.5 kg (214 lb 15.2 oz)  BMI 29.15 kg/m2  SpO2 96%  Intake/Output Summary (Last 24 hours) at 11/10/14 [id number] Last data filed at 11/09/14 2000  Gross per 24 hour  Intake    480 ml  Output   3813 ml  Net  -3333 ml   Weight change: 2.259 kg (4 lb 15.7 oz) [id number] and alert CVS: RRR Resp: clear Abd:+ BS NTND Ext:no edema  LUA AVG + bruit NEURO:CNI Ox3 no asterixis   . amitriptyline  100 mg Oral QHS  . amLODipine  10 mg Oral Daily  . aspirin EC  81 mg Oral Daily  . atorvastatin  40 mg Oral Daily  . calcium acetate  1,334 mg Oral TID WC  . carvedilol  50 mg Oral BID WC  . cinacalcet  90 mg Oral QPM  . [START ON 11/11/2014] doxercalciferol  2 mcg Intravenous Q T,Th,Sa-HD  . heparin  5,000 Units Subcutaneous 3 times per day  . insulin aspart  0-5 Units Subcutaneous QHS  . insulin aspart  0-9 Units Subcutaneous TID WC  . multivitamin  1 tablet Oral QHS  . pregabalin  100 mg Oral TID  . sevelamer carbonate  2,400 mg Oral BID WC   And  . sevelamer carbonate  1,600 mg Oral Q lunch  . sodium chloride  3 mL Intravenous Q12H  . venlafaxine  37.5 mg Oral BID   Dg Chest Port 1 View  11/09/2014   CLINICAL DATA:  Chest pain and difficulty breathing  EXAM: PORTABLE CHEST - 1 VIEW  COMPARISON:  April 26, 2014  FINDINGS: There is stable elevation of the left hemidiaphragm. The interstitium is prominent, probably representing chronic interstitial edema. Heart is mildly enlarged, and there is slight pulmonary venous hypertension. No adenopathy. No airspace consolidation. No bone lesions.  IMPRESSION: Findings felt to be consistent with a degree of chronic congestive heart failure. No appreciable change from prior study. No airspace consolidation.   Electronically Signed   By: Lowella Grip M.D.   On: 11/09/2014 07:02   BMET    Component Value Date/Time    NA 138 11/10/2014 0728   K 4.5 11/10/2014 0728   CL 99 11/10/2014 0728   CO2 28 11/10/2014 0728   GLUCOSE 118* 11/10/2014 0728   BUN 44* 11/10/2014 0728   CREATININE 9.62* 11/10/2014 0728   CALCIUM 8.6 11/10/2014 0728   GFRNONAA 5* 11/10/2014 0728   GFRAA 6* 11/10/2014 0728   CBC    Component Value Date/Time   WBC 6.2 11/10/2014 0728   RBC 3.94* 11/10/2014 0728   HGB 10.6* 11/10/2014 0728   HCT 31.6* 11/10/2014 0728   PLT 203 11/10/2014 0728   MCV 80.2 11/10/2014 0728   MCH 26.9 11/10/2014 0728   MCHC 33.5 11/10/2014 0728   RDW 15.2 11/10/2014 0728   LYMPHSABS 1.4 04/27/2014 0320   MONOABS 0.5 04/27/2014 0320   EOSABS 0.3 04/27/2014 0320   BASOSABS 0.0 04/27/2014 0320     Assessment: 1. ESRD 2. Vol overload 3. Sec HPTH 4. Anemia 5. HTN 6. DM Plan: 1. I think is CP was due to his cough which was due to volume overload which has been resolved with HD.  He feels well and wants to go home.  He is set up for HD at [facility] 2nd shift   Armella Stogner T

## 2014-11-11 LAB — PARATHYROID HORMONE, INTACT (NO CA): PTH: 247 pg/mL — AB (ref 15–65)

## 2014-11-20 ENCOUNTER — Inpatient Hospital Stay (HOSPITAL_COMMUNITY)
Admission: EM | Admit: 2014-11-20 | Discharge: 2014-11-24 | DRG: 286 | Disposition: A | Discharge status: Home / Self Care | Payer: Medicare Other | Attending: Internal Medicine | Admitting: Internal Medicine

## 2014-11-20 ENCOUNTER — Encounter (HOSPITAL_COMMUNITY): Payer: Self-pay | Admitting: Emergency Medicine

## 2014-11-20 ENCOUNTER — Emergency Department (HOSPITAL_COMMUNITY): Payer: Medicare Other

## 2014-11-20 DIAGNOSIS — N186 End stage renal disease: Secondary | ICD-10-CM | POA: Diagnosis present

## 2014-11-20 DIAGNOSIS — R0789 Other chest pain: Secondary | ICD-10-CM | POA: Diagnosis not present

## 2014-11-20 DIAGNOSIS — Z955 Presence of coronary angioplasty implant and graft: Secondary | ICD-10-CM

## 2014-11-20 DIAGNOSIS — R072 Precordial pain: Secondary | ICD-10-CM

## 2014-11-20 DIAGNOSIS — I5032 Chronic diastolic (congestive) heart failure: Secondary | ICD-10-CM | POA: Diagnosis present

## 2014-11-20 DIAGNOSIS — F329 Major depressive disorder, single episode, unspecified: Secondary | ICD-10-CM | POA: Diagnosis present

## 2014-11-20 DIAGNOSIS — E119 Type 2 diabetes mellitus without complications: Secondary | ICD-10-CM | POA: Diagnosis present

## 2014-11-20 DIAGNOSIS — R634 Abnormal weight loss: Secondary | ICD-10-CM | POA: Diagnosis present

## 2014-11-20 DIAGNOSIS — Z79899 Other long term (current) drug therapy: Secondary | ICD-10-CM

## 2014-11-20 DIAGNOSIS — Z8639 Personal history of other endocrine, nutritional and metabolic disease: Secondary | ICD-10-CM

## 2014-11-20 DIAGNOSIS — Z992 Dependence on renal dialysis: Secondary | ICD-10-CM

## 2014-11-20 DIAGNOSIS — I2510 Atherosclerotic heart disease of native coronary artery without angina pectoris: Secondary | ICD-10-CM | POA: Diagnosis present

## 2014-11-20 DIAGNOSIS — I25110 Atherosclerotic heart disease of native coronary artery with unstable angina pectoris: Principal | ICD-10-CM | POA: Diagnosis present

## 2014-11-20 DIAGNOSIS — I959 Hypotension, unspecified: Secondary | ICD-10-CM | POA: Diagnosis present

## 2014-11-20 DIAGNOSIS — K219 Gastro-esophageal reflux disease without esophagitis: Secondary | ICD-10-CM | POA: Diagnosis present

## 2014-11-20 DIAGNOSIS — Z8249 Family history of ischemic heart disease and other diseases of the circulatory system: Secondary | ICD-10-CM

## 2014-11-20 DIAGNOSIS — J449 Chronic obstructive pulmonary disease, unspecified: Secondary | ICD-10-CM | POA: Diagnosis present

## 2014-11-20 DIAGNOSIS — I120 Hypertensive chronic kidney disease with stage 5 chronic kidney disease or end stage renal disease: Secondary | ICD-10-CM | POA: Diagnosis present

## 2014-11-20 DIAGNOSIS — I10 Essential (primary) hypertension: Secondary | ICD-10-CM | POA: Diagnosis present

## 2014-11-20 DIAGNOSIS — R079 Chest pain, unspecified: Secondary | ICD-10-CM

## 2014-11-20 DIAGNOSIS — Z9861 Coronary angioplasty status: Secondary | ICD-10-CM

## 2014-11-20 DIAGNOSIS — Z794 Long term (current) use of insulin: Secondary | ICD-10-CM

## 2014-11-20 DIAGNOSIS — D631 Anemia in chronic kidney disease: Secondary | ICD-10-CM | POA: Diagnosis present

## 2014-11-20 DIAGNOSIS — N2581 Secondary hyperparathyroidism of renal origin: Secondary | ICD-10-CM | POA: Diagnosis present

## 2014-11-20 HISTORY — DX: Dependence on renal dialysis: N18.6

## 2014-11-20 HISTORY — DX: Hematuria, unspecified: R31.9

## 2014-11-20 HISTORY — DX: Melena: K92.1

## 2014-11-20 HISTORY — DX: Atherosclerotic heart disease of native coronary artery without angina pectoris: I25.10

## 2014-11-20 HISTORY — DX: Anemia, unspecified: D64.9

## 2014-11-20 HISTORY — DX: Polyp of colon: K63.5

## 2014-11-20 HISTORY — DX: Chronic diastolic (congestive) heart failure: I50.32

## 2014-11-20 HISTORY — DX: End stage renal disease: Z99.2

## 2014-11-20 LAB — CBC
HEMATOCRIT: 41 % (ref 39.0–52.0)
Hemoglobin: 13.1 g/dL (ref 13.0–17.0)
MCH: 27.1 pg (ref 26.0–34.0)
MCHC: 32 g/dL (ref 30.0–36.0)
MCV: 84.7 fL (ref 78.0–100.0)
Platelets: 193 10*3/uL (ref 150–400)
RBC: 4.84 MIL/uL (ref 4.22–5.81)
RDW: 16.2 % — ABNORMAL HIGH (ref 11.5–15.5)
WBC: 6.4 10*3/uL (ref 4.0–10.5)

## 2014-11-20 LAB — BASIC METABOLIC PANEL
Anion gap: 13 (ref 5–15)
BUN: 24 mg/dL — ABNORMAL HIGH (ref 6–23)
CALCIUM: 9.4 mg/dL (ref 8.4–10.5)
CO2: 30 mmol/L (ref 19–32)
Chloride: 95 mmol/L — ABNORMAL LOW (ref 96–112)
Creatinine, Ser: 6.7 mg/dL — ABNORMAL HIGH (ref 0.50–1.35)
GFR, EST AFRICAN AMERICAN: 10 mL/min — AB (ref >90)
GFR, EST NON AFRICAN AMERICAN: 8 mL/min — AB (ref >90)
Glucose, Bld: 81 mg/dL (ref 70–99)
POTASSIUM: 4.5 mmol/L (ref 3.5–5.1)
Sodium: 138 mmol/L (ref 135–145)

## 2014-11-20 LAB — I-STAT TROPONIN, ED: TROPONIN I, POC: 0.01 ng/mL (ref 0.00–0.08)

## 2014-11-20 LAB — BRAIN NATRIURETIC PEPTIDE: B Natriuretic Peptide: 75.6 pg/mL (ref 0.0–100.0)

## 2014-11-20 MED ORDER — NITROGLYCERIN 0.4 MG SL SUBL
0.4000 mg | SUBLINGUAL_TABLET | SUBLINGUAL | Status: AC | PRN
  Administered 2014-11-21 (×3): 0.4 mg via SUBLINGUAL
  Filled 2014-11-20 (×2): qty 1

## 2014-11-20 MED ORDER — ASPIRIN 81 MG PO CHEW
324.0000 mg | CHEWABLE_TABLET | Freq: Once | ORAL | Status: AC
  Administered 2014-11-21: 324 mg via ORAL
  Filled 2014-11-20: qty 4

## 2014-11-20 NOTE — ED Provider Notes (Signed)
CSN: [id number]     Arrival date & time 11/20/14  2234 History  This chart was scribed for Julianne Rice, MD by Chester Holstein, ED Scribe. This patient was seen in room B15C/B15C and the patient's care was started at 11:20 PM.    Chief Complaint  Patient presents with  . Chest Pain    Patient is a 59 y.o. male presenting with chest pain. The history is provided by the patient. No language interpreter was used.  Chest Pain Associated symptoms: nausea and shortness of breath   Associated symptoms: no abdominal pain, no back pain, no cough, no dizziness, no fever, no headache, no numbness, no palpitations, not vomiting and no weakness    HPI Comments: [name] [name] is a 59 y.o. male with PMHx of HTN, DM, and renal disorder who presents to the Emergency Department complaining of constant 6/10 sharp midline chest pain with onset this morning while pt was on hemodialysis.  Pt notes associated mild SOB and nausea. Pt notes pain does not radiate. Pt was seen in the ED on 02/02 for SOB and chest pain with diagnoses of CHF, HTN, and noncompliance with hemodialysis.  Pt had stents placed in 2012. Pt denies cough and any other associated symptoms.    Past Medical History  Diagnosis Date  . Hypertension   . Renal disorder   . Diabetes mellitus without complication   . Hemodialysis patient    History reviewed. No pertinent past surgical history. No family history on file. History  Substance Use Topics  . Smoking status: Never Smoker   . Smokeless tobacco: Never Used  . Alcohol Use: No    Review of Systems  Constitutional: Negative for fever and chills.  Respiratory: Positive for shortness of breath. Negative for cough and wheezing.   Cardiovascular: Positive for chest pain. Negative for palpitations and leg swelling.  Gastrointestinal: Positive for nausea. Negative for vomiting, abdominal pain, diarrhea and constipation.  Musculoskeletal: Negative for back pain.  Neurological: Negative for  dizziness, weakness, light-headedness, numbness and headaches.  All other systems reviewed and are negative.     Allergies  Enalapril  Home Medications   Prior to Admission medications   Medication Sig Start Date End Date Taking? Authorizing Provider  amitriptyline (ELAVIL) 100 MG tablet Take 100 mg by mouth at bedtime.    Historical Provider, MD  amLODipine (NORVASC) 10 MG tablet Take 10 mg by mouth daily.    Historical Provider, MD  aspirin EC 81 MG tablet Take 81 mg by mouth daily.    Historical Provider, MD  atorvastatin (LIPITOR) 40 MG tablet Take 40 mg by mouth daily.    Historical Provider, MD  calcium acetate (PHOSLO) 667 MG capsule Take 2 capsules (1,334 mg total) by mouth 3 (three) times daily with meals. 11/10/14   Albin Felling, MD  carvedilol (COREG) 25 MG tablet Take 50 mg by mouth 2 (two) times daily with a meal.    Historical Provider, MD  cinacalcet (SENSIPAR) 30 MG tablet Take 60 mg by mouth at bedtime.    Historical Provider, MD  insulin aspart (NOVOLOG) 100 UNIT/ML injection Inject 15-20 Units into the skin 3 (three) times daily before meals.    Historical Provider, MD  Ipratropium-Albuterol (COMBIVENT RESPIMAT) 20-100 MCG/ACT AERS respimat Inhale 2 puffs into the lungs every 6 (six) hours as needed for wheezing.    Historical Provider, MD  nitroGLYCERIN (NITROSTAT) 0.4 MG SL tablet Place 0.4 mg under the tongue every 5 (five) minutes as needed for chest  pain.    Historical Provider, MD  pregabalin (LYRICA) 100 MG capsule Take 100 mg by mouth 3 (three) times daily.    Historical Provider, MD  sevelamer carbonate (RENVELA) 800 MG tablet Take 1,600-2,400 mg by mouth 3 (three) times daily with meals. *takes 2400mg  in the morning, 1600mg  midday, and 2400mg  at night*    Historical Provider, MD  venlafaxine (EFFEXOR) 37.5 MG tablet Take 37.5 mg by mouth 2 (two) times daily.    Historical Provider, MD   BP 159/99 mmHg  Pulse 98  Temp(Src) 98 F (36.7 C)  Resp 18  SpO2  100% Physical Exam  Constitutional: He is oriented to person, place, and time. He appears well-developed and well-nourished. No distress.  HENT:  Head: Normocephalic and atraumatic.  Mouth/Throat: Oropharynx is clear and moist.  Eyes: Conjunctivae and EOM are normal. Pupils are equal, round, and reactive to light.  Neck: Normal range of motion. Neck supple.  Cardiovascular: Normal rate and regular rhythm.   Pulmonary/Chest: Effort normal and breath sounds normal. No respiratory distress. He has no wheezes. He has no rales. He exhibits no tenderness.  Abdominal: Soft. Bowel sounds are normal. He exhibits no distension and no mass. There is no tenderness. There is no rebound and no guarding.  Musculoskeletal: Normal range of motion. He exhibits no edema or tenderness.  Left upper extremity fistula with palpable thrill. No lower extremity swelling or pain.  Neurological: He is alert and oriented to person, place, and time.  Skin: Skin is warm and dry. No rash noted. No erythema.  Psychiatric: He has a normal mood and affect. His behavior is normal.  Nursing note and vitals reviewed.   ED Course  Procedures (including critical care time) DIAGNOSTIC STUDIES: Oxygen Saturation is 100% on room air, normal by my interpretation.    COORDINATION OF CARE: 11:25 PM Discussed treatment plan with patient at beside, the patient agrees with the plan and has no further questions at this time.   Labs Review Labs Reviewed  CBC - Abnormal; Notable for the following:    RDW 16.2 (*)    All other components within normal limits  BASIC METABOLIC PANEL  BRAIN NATRIURETIC PEPTIDE  I-STAT TROPOININ, ED    Imaging Review Dg Chest 2 View  11/20/2014   CLINICAL DATA:  Chest pain today.  Initial encounter.  EXAM: CHEST  2 VIEW  COMPARISON:  11/09/2014  FINDINGS: There is mild stable left hemidiaphragm elevation. Pulmonary vasculature has essentially normalized since the study of 11/09/2014. Lungs are clear.  There are no pleural effusions. Hilar, mediastinal and cardiac contours are unremarkable.  IMPRESSION: No active cardiopulmonary disease.   Electronically Signed   By: Andreas Newport M.D.   On: 11/20/2014 23:01     EKG Interpretation   Date/Time:  Saturday November 20 2014 22:38:09 EST Ventricular Rate:  98 PR Interval:  142 QRS Duration: 94 QT Interval:  362 QTC Calculation: 462 R Axis:   62 Text Interpretation:  Normal sinus rhythm Possible Left atrial enlargement  Borderline ECG No significant change was found Confirmed by CAMPOS  MD,  Lennette Bihari ([id number]) on 11/20/2014 11:08:47 PM      MDM   Final diagnoses:  Chest pain    I personally performed the services described in this documentation, which was scribed in my presence. The recorded information has been reviewed and is accurate.    Given aspirin and nitroglycerin in the emergency department. Patient has mild improvement of his chest pain from a 610 and  5 of 10. He is resting comfortably in no apparent distress. Discussed with internal medicine teaching service regarding admission.  Julianne Rice, MD 11/21/14 [phone]

## 2014-11-20 NOTE — ED Notes (Signed)
Pt. reports central chest pain with SOB , cough and nausea onset today while on hemodialysis.

## 2014-11-21 ENCOUNTER — Encounter (HOSPITAL_COMMUNITY): Payer: Self-pay | Admitting: Physician Assistant

## 2014-11-21 DIAGNOSIS — I120 Hypertensive chronic kidney disease with stage 5 chronic kidney disease or end stage renal disease: Secondary | ICD-10-CM | POA: Diagnosis present

## 2014-11-21 DIAGNOSIS — R0789 Other chest pain: Secondary | ICD-10-CM | POA: Diagnosis present

## 2014-11-21 DIAGNOSIS — R072 Precordial pain: Secondary | ICD-10-CM

## 2014-11-21 DIAGNOSIS — D631 Anemia in chronic kidney disease: Secondary | ICD-10-CM | POA: Diagnosis present

## 2014-11-21 DIAGNOSIS — N186 End stage renal disease: Secondary | ICD-10-CM

## 2014-11-21 DIAGNOSIS — I10 Essential (primary) hypertension: Secondary | ICD-10-CM

## 2014-11-21 DIAGNOSIS — R634 Abnormal weight loss: Secondary | ICD-10-CM | POA: Diagnosis present

## 2014-11-21 DIAGNOSIS — E119 Type 2 diabetes mellitus without complications: Secondary | ICD-10-CM | POA: Diagnosis present

## 2014-11-21 DIAGNOSIS — J449 Chronic obstructive pulmonary disease, unspecified: Secondary | ICD-10-CM | POA: Diagnosis present

## 2014-11-21 DIAGNOSIS — N2581 Secondary hyperparathyroidism of renal origin: Secondary | ICD-10-CM | POA: Diagnosis present

## 2014-11-21 DIAGNOSIS — F329 Major depressive disorder, single episode, unspecified: Secondary | ICD-10-CM | POA: Diagnosis present

## 2014-11-21 DIAGNOSIS — Z8249 Family history of ischemic heart disease and other diseases of the circulatory system: Secondary | ICD-10-CM | POA: Diagnosis not present

## 2014-11-21 DIAGNOSIS — Z955 Presence of coronary angioplasty implant and graft: Secondary | ICD-10-CM | POA: Diagnosis not present

## 2014-11-21 DIAGNOSIS — I25119 Atherosclerotic heart disease of native coronary artery with unspecified angina pectoris: Secondary | ICD-10-CM

## 2014-11-21 DIAGNOSIS — I5032 Chronic diastolic (congestive) heart failure: Secondary | ICD-10-CM | POA: Diagnosis present

## 2014-11-21 DIAGNOSIS — Z79899 Other long term (current) drug therapy: Secondary | ICD-10-CM | POA: Diagnosis not present

## 2014-11-21 DIAGNOSIS — K219 Gastro-esophageal reflux disease without esophagitis: Secondary | ICD-10-CM | POA: Diagnosis present

## 2014-11-21 DIAGNOSIS — I959 Hypotension, unspecified: Secondary | ICD-10-CM | POA: Diagnosis present

## 2014-11-21 DIAGNOSIS — Z794 Long term (current) use of insulin: Secondary | ICD-10-CM | POA: Diagnosis not present

## 2014-11-21 DIAGNOSIS — I25110 Atherosclerotic heart disease of native coronary artery with unstable angina pectoris: Secondary | ICD-10-CM | POA: Diagnosis present

## 2014-11-21 DIAGNOSIS — Z992 Dependence on renal dialysis: Secondary | ICD-10-CM | POA: Diagnosis not present

## 2014-11-21 LAB — TROPONIN I

## 2014-11-21 LAB — GLUCOSE, CAPILLARY
Glucose-Capillary: 89 mg/dL (ref 70–99)
Glucose-Capillary: 130 mg/dL — ABNORMAL HIGH (ref 70–99)
Glucose-Capillary: 101 mg/dL — ABNORMAL HIGH (ref 70–99)
Glucose-Capillary: 120 mg/dL — ABNORMAL HIGH (ref 70–99)

## 2014-11-21 LAB — PLATELET INHIBITION P2Y12: Platelet Function  P2Y12: 294 [PRU] (ref 194–418)

## 2014-11-21 MED ORDER — MORPHINE SULFATE 2 MG/ML IJ SOLN
2.0000 mg | INTRAMUSCULAR | Status: DC | PRN
  Administered 2014-11-21 – 2014-11-23 (×5): 2 mg via INTRAVENOUS
  Filled 2014-11-21 (×7): qty 1

## 2014-11-21 MED ORDER — CINACALCET HCL 30 MG PO TABS
60.0000 mg | ORAL_TABLET | Freq: Every day | ORAL | Status: DC
  Administered 2014-11-21 – 2014-11-23 (×3): 60 mg via ORAL
  Filled 2014-11-21 (×4): qty 2

## 2014-11-21 MED ORDER — INSULIN ASPART 100 UNIT/ML ~~LOC~~ SOLN
0.0000 [IU] | Freq: Three times a day (TID) | SUBCUTANEOUS | Status: DC
  Administered 2014-11-21 – 2014-11-23 (×2): 1 [IU] via SUBCUTANEOUS

## 2014-11-21 MED ORDER — IPRATROPIUM-ALBUTEROL 0.5-2.5 (3) MG/3ML IN SOLN: 3.0000 mL | Freq: Four times a day (QID) | RESPIRATORY_TRACT | Status: DC | PRN

## 2014-11-21 MED ORDER — SODIUM CHLORIDE 0.9 % IV SOLN: 62.5000 mg | INTRAVENOUS | Status: DC

## 2014-11-21 MED ORDER — SEVELAMER CARBONATE 800 MG PO TABS
2400.0000 mg | ORAL_TABLET | Freq: Two times a day (BID) | ORAL | Status: DC
  Administered 2014-11-21 – 2014-11-24 (×7): 2400 mg via ORAL
  Filled 2014-11-21 (×10): qty 3

## 2014-11-21 MED ORDER — ATORVASTATIN CALCIUM 40 MG PO TABS
40.0000 mg | ORAL_TABLET | Freq: Every day | ORAL | Status: DC
  Administered 2014-11-21 – 2014-11-24 (×4): 40 mg via ORAL
  Filled 2014-11-21 (×4): qty 1

## 2014-11-21 MED ORDER — VENLAFAXINE HCL 37.5 MG PO TABS
37.5000 mg | ORAL_TABLET | Freq: Two times a day (BID) | ORAL | Status: DC
  Administered 2014-11-21 – 2014-11-24 (×7): 37.5 mg via ORAL
  Filled 2014-11-21 (×8): qty 1

## 2014-11-21 MED ORDER — PANTOPRAZOLE SODIUM 40 MG PO TBEC
40.0000 mg | DELAYED_RELEASE_TABLET | Freq: Every day | ORAL | Status: DC
  Administered 2014-11-21 – 2014-11-24 (×4): 40 mg via ORAL
  Filled 2014-11-21 (×4): qty 1

## 2014-11-21 MED ORDER — ONDANSETRON HCL 4 MG/2ML IJ SOLN: 4.0000 mg | Freq: Four times a day (QID) | INTRAMUSCULAR | Status: DC | PRN

## 2014-11-21 MED ORDER — MORPHINE SULFATE 4 MG/ML IJ SOLN
4.0000 mg | Freq: Once | INTRAMUSCULAR | Status: AC
  Administered 2014-11-21: 4 mg via INTRAVENOUS
  Filled 2014-11-21: qty 1

## 2014-11-21 MED ORDER — NITROGLYCERIN 0.4 MG SL SUBL
0.4000 mg | SUBLINGUAL_TABLET | SUBLINGUAL | Status: DC | PRN
  Administered 2014-11-21: 0.4 mg via SUBLINGUAL

## 2014-11-21 MED ORDER — AMLODIPINE BESYLATE 10 MG PO TABS
10.0000 mg | ORAL_TABLET | Freq: Every day | ORAL | Status: DC
  Administered 2014-11-21: 10 mg via ORAL
  Filled 2014-11-21 (×2): qty 1

## 2014-11-21 MED ORDER — ACETAMINOPHEN 325 MG PO TABS
650.0000 mg | ORAL_TABLET | ORAL | Status: DC | PRN
  Administered 2014-11-21 – 2014-11-22 (×2): 650 mg via ORAL
  Filled 2014-11-21 (×2): qty 2

## 2014-11-21 MED ORDER — DOXERCALCIFEROL 4 MCG/2ML IV SOLN
2.0000 ug | INTRAVENOUS | Status: DC
  Administered 2014-11-23: 2 ug via INTRAVENOUS
  Filled 2014-11-21: qty 2

## 2014-11-21 MED ORDER — AMITRIPTYLINE HCL 100 MG PO TABS
100.0000 mg | ORAL_TABLET | Freq: Every day | ORAL | Status: DC
  Administered 2014-11-21 – 2014-11-23 (×3): 100 mg via ORAL
  Filled 2014-11-21 (×4): qty 1

## 2014-11-21 MED ORDER — SEVELAMER CARBONATE 800 MG PO TABS
1600.0000 mg | ORAL_TABLET | Freq: Every day | ORAL | Status: DC
  Administered 2014-11-21 – 2014-11-24 (×4): 1600 mg via ORAL
  Filled 2014-11-21 (×4): qty 2

## 2014-11-21 MED ORDER — ASPIRIN EC 81 MG PO TBEC
81.0000 mg | DELAYED_RELEASE_TABLET | Freq: Every day | ORAL | Status: DC
  Administered 2014-11-21 – 2014-11-24 (×4): 81 mg via ORAL
  Filled 2014-11-21 (×4): qty 1

## 2014-11-21 MED ORDER — CLOPIDOGREL BISULFATE 75 MG PO TABS
75.0000 mg | ORAL_TABLET | Freq: Every day | ORAL | Status: DC
  Administered 2014-11-21 – 2014-11-24 (×4): 75 mg via ORAL
  Filled 2014-11-21 (×4): qty 1

## 2014-11-21 MED ORDER — CARVEDILOL 25 MG PO TABS
50.0000 mg | ORAL_TABLET | Freq: Two times a day (BID) | ORAL | Status: DC
  Administered 2014-11-21 – 2014-11-24 (×7): 50 mg via ORAL
  Filled 2014-11-21 (×10): qty 2

## 2014-11-21 MED ORDER — HEPARIN SODIUM (PORCINE) 5000 UNIT/ML IJ SOLN
5000.0000 [IU] | Freq: Three times a day (TID) | INTRAMUSCULAR | Status: DC
  Administered 2014-11-21 – 2014-11-23 (×7): 5000 [IU] via SUBCUTANEOUS
  Filled 2014-11-21 (×10): qty 1

## 2014-11-21 MED ORDER — PREGABALIN 100 MG PO CAPS
100.0000 mg | ORAL_CAPSULE | Freq: Three times a day (TID) | ORAL | Status: DC
  Administered 2014-11-21 – 2014-11-24 (×10): 100 mg via ORAL
  Filled 2014-11-21 (×10): qty 1

## 2014-11-21 MED ORDER — GI COCKTAIL ~~LOC~~
30.0000 mL | Freq: Four times a day (QID) | ORAL | Status: DC | PRN
  Administered 2014-11-21: 30 mL via ORAL
  Filled 2014-11-21 (×4): qty 30

## 2014-11-21 MED ORDER — CALCIUM ACETATE 667 MG PO CAPS
1334.0000 mg | ORAL_CAPSULE | Freq: Three times a day (TID) | ORAL | Status: DC
  Administered 2014-11-21 – 2014-11-24 (×11): 1334 mg via ORAL
  Filled 2014-11-21 (×14): qty 2

## 2014-11-21 NOTE — Progress Notes (Signed)
59 year old pt received via stretcher to room 235, pt is awake and alert, went to dialysis 2/13 and had c/o chest pain.  Came to ER for treatment.

## 2014-11-21 NOTE — Progress Notes (Signed)
Called to pts room for c/o chest pain, pt rated pain at 9 on 0-10 scale.  EKG being done at this time-shows Normal sinus rhythm with left ventricular hypertrophy, will continue to monitor pt.

## 2014-11-21 NOTE — Progress Notes (Signed)
59 year old male received to room 235 from ER via stretcher for diagnosis of chest pain, pt is dialysis patient and underwent hemodialysis  11/20/2014, complained of chest pain and was brought to ER.  He is alert and oriented, has slight cough at times, no sputum production per pt.  Shunt located in pts left arm covered with guaze, INT located in right forearm.

## 2014-11-21 NOTE — H&P (Signed)
Date: 11/21/2014               Patient Name:  [name] [name] MRN: [id number]  DOB: [birth year] Age / Sex: 59 y.o., male   PCP: [name]         Medical Service: Internal Medicine Teaching Service         Attending Physician: Dr. Julianne Rice, MD    First Contact: Dr. Albin Felling  Pager: [phone]   Second Contact: Dr. Juluis Mire  Pager: [phone]        After Hours (After 5p/  First Contact Pager: [phone]  weekends / holidays): Second Contact Pager: [phone]   Chief Complaint: Chest pain  History of Present Illness: [name] [name] is a 59 year old male with hypertension, end-stage renal disease, type 2 diabetes, coronary arteries disease status post stent who presents to the ED with chest pain.  Yesterday, he was receiving dialysis, and near the end of the session, he reported the onset of midsternal, sharp chest pain. He was given 2 tablets of Tylenol and dialysis and took another 2 when he got home though pain did not improve. Pain does not radiate or extend anywhere but is associated with feeling short of breath along with nausea and vomiting. Upon returning home, his pain did not improve, and he told his son to bring him to the hospital. He does not feel this pain is similar in nature to the one preceding the insertion of his cardiac stents though does report this pain has been recurrent since the summer and improves once he vomits but has contributed to him losing roughly 50 pounds. Otherwise, he denies any recent NSAID use, new changes in vision, diarrhea, dysuria, hematochezia, hemoptysis, recent sick contact, recent illness, changes to his medication though notes mild dizziness. He was hospitalized from 11/09/14-11/10/14 for volume overload setting of having missed one session of dialysis. Since his last hospitalization, he is at home with his grandchildren and denies any recent tobacco, alcohol, illicit drug use.  In the ED, he was given aspirin 324 mg, morphine 4 mg IV, sublingual  nitroglycerin which improved his pain.     Meds: No current facility-administered medications for this encounter.   Current Outpatient Prescriptions  Medication Sig Dispense Refill  . amitriptyline (ELAVIL) 100 MG tablet Take 100 mg by mouth at bedtime.    [name] amLODipine (NORVASC) 10 MG tablet Take 10 mg by mouth daily.    [name] aspirin EC 81 MG tablet Take 81 mg by mouth daily.    [name] atorvastatin (LIPITOR) 40 MG tablet Take 40 mg by mouth daily.    . calcium acetate (PHOSLO) 667 MG capsule Take 2 capsules (1,334 mg total) by mouth 3 (three) times daily with meals. 90 capsule 0  . carvedilol (COREG) 25 MG tablet Take 50 mg by mouth 2 (two) times daily with a meal.    . cinacalcet (SENSIPAR) 30 MG tablet Take 60 mg by mouth at bedtime.    . insulin aspart (NOVOLOG) 100 UNIT/ML injection Inject 15-20 Units into the skin 3 (three) times daily before meals.    . Ipratropium-Albuterol (COMBIVENT RESPIMAT) 20-100 MCG/ACT AERS respimat Inhale 2 puffs into the lungs every 6 (six) hours as needed for wheezing.    . nitroGLYCERIN (NITROSTAT) 0.4 MG SL tablet Place 0.4 mg under the tongue every 5 (five) minutes as needed for chest pain.    . pregabalin (LYRICA) 100 MG capsule Take 100 mg by mouth 3 (three) times daily.    [name]  sevelamer carbonate (RENVELA) 800 MG tablet Take 1,600-2,400 mg by mouth 3 (three) times daily with meals. *takes 2400mg  in the morning, 1600mg  midday, and 2400mg  at night*    . venlafaxine (EFFEXOR) 37.5 MG tablet Take 37.5 mg by mouth 2 (two) times daily.      Allergies: Allergies as of 11/20/2014 - Review Complete 11/20/2014  Allergen Reaction Noted  . Enalapril Hives 04/26/2014   Past Medical History  Diagnosis Date  . Hypertension   . Renal disorder   . Diabetes mellitus without complication   . Hemodialysis patient    History reviewed. No pertinent past surgical history. No family history on file. History   Social History  . Marital Status: Unknown    Spouse Name:  N/A  . Number of Children: N/A  . Years of Education: N/A   Occupational History  . Not on file.   Social History Main Topics  . Smoking status: Never Smoker   . Smokeless tobacco: Never Used  . Alcohol Use: No  . Drug Use: No  . Sexual Activity: Not on file   Other Topics Concern  . Not on file   Social History Narrative    Review of Systems: Review of Systems  Constitutional: Positive for weight loss. Negative for fever.  Eyes: Positive for blurred vision (Related to diabetes per self-report for which he has undergone laser surgery).  Respiratory: Positive for shortness of breath (Associated with chest pain). Negative for cough and hemoptysis.   Cardiovascular: Positive for chest pain. Negative for leg swelling.  Gastrointestinal: Positive for nausea and vomiting. Negative for abdominal pain, diarrhea and melena.  Genitourinary: Negative for hematuria.  Skin: Negative for rash.  Neurological: Positive for dizziness and headaches (Mild).  Psychiatric/Behavioral: Negative for substance abuse.     Physical Exam: Blood pressure 127/80, pulse 77, temperature 98 F (36.7 C), resp. rate 17, SpO2 96 %. General: resting in bed, mild distress HEENT: PERRL, EOMI, no scleral icterus, oropharynx clear Cardiac: RRR, continuous murmur best auscultated in the mitral area likely secondary to fistula Pulm: clear to auscultation bilaterally, no wheezes, rales, or rhonchi Abd: soft, nontender, nondistended, BS present Ext: warm and well perfused, no tibial edema, extremities covered in tattoos, left brachiocephalic AV fistula with palpable thrill Neuro: responds to questions appropriately; moving all extremities freely   Lab results: Basic Metabolic Panel:  Recent Labs  11/20/14 2245  NA 138  K 4.5  CL 95*  CO2 30  GLUCOSE 81  BUN 24*  CREATININE 6.70*  CALCIUM 9.4   CBC:  Recent Labs  11/20/14 2245  WBC 6.4  HGB 13.1  HCT 41.0  MCV 84.7  PLT 193   Urine Drug  Screen: Drugs of Abuse     Component Value Date/Time   LABOPIA NONE DETECTED 11/09/2014 0916   COCAINSCRNUR NONE DETECTED 11/09/2014 0916   LABBENZ NONE DETECTED 11/09/2014 0916   AMPHETMU NONE DETECTED 11/09/2014 0916   THCU NONE DETECTED 11/09/2014 0916   LABBARB NONE DETECTED 11/09/2014 0916      Imaging results:  Dg Chest 2 View  11/20/2014   CLINICAL DATA:  Chest pain today.  Initial encounter.  EXAM: CHEST  2 VIEW  COMPARISON:  11/09/2014  FINDINGS: There is mild stable left hemidiaphragm elevation. Pulmonary vasculature has essentially normalized since the study of 11/09/2014. Lungs are clear. There are no pleural effusions. Hilar, mediastinal and cardiac contours are unremarkable.  IMPRESSION: No active cardiopulmonary disease.   Electronically Signed   By: Andreas Newport  M.D.   On: 11/20/2014 23:01    Other results: EKG: Reviewed and compared with 11/10/14 Normal sinus rhythm V3: J-point elevation, stable from prior   Assessment & Plan by Problem:  Chest pain: Similar to the symptoms he reported at most recent hospitalization, notably the associated nausea and vomiting, though he is not full volume overloaded at this time. Stable EKG findings and initial negative troponin in the emergency department is otherwise reassuring against ACS. Chest x-ray also appears unremarkable for any acute process. -Monitor on telemetry -Check troponins x 3 -Repeat EKG in AM -Continue nitroglycerin sublingual prn for chest pain -Continue morphine every 2 hours as needed for chest pain -Give GI cocktail every 6 hours as needed -Consider EGD if his symptoms do not improve  Unintentional weight loss: Concern raised at last hospitalization that this may be in the setting of malignancy. He denies any difficulty swallowing which would suggest an esophageal lesion. He reports prior history of melena though none active which does raise suspicion for colon involvement. He also reported at last  hospitalization a history of hematuria which would be suggestive of genitourinary lesions though again he does not have any active symptoms at this time. -As noted above  End-stage renal disease: He reports that he is on T/H/SA schedule and he has not missed a single session since his last hospitalization. Creatinine 6.7, improved from 9.6 on 11/10/14. -Consult nephrology for further management -Continue home Renvela 1600-2400 mg 3 times a day with meals -Continue PhosLo 1334 mg 3 times a day with meals -Continue Sensipar 60 mg daily at bedtime  Coronary artery disease: Reports having cardiac stents placed at [facility]. TEE performed on 04/27/2014 with EF 60-65% and grade 2 diastolic dysfunction but no regional wall motion abnormalities. -Continue aspirin 81 mg -Continue atorvastatin 40 mg  Type 2 diabetes: A1c 5.5 on last admission. Lantus was discontinued at the time of his discharge given that his blood sugars trended mainly in the 100s without it. He also required minimal mealtime coverage. -Continue Lyrica 100 mg 3 times a day -Continue SSI-S  Hypertension: Continue Coreg 50 mg 2 times daily with meals and Norvasc 10 mg.  COPD: Continue Combivent 2 puffs every 6 hours as needed for wheezing  Depression: Continue home Effexor 37.5 mg twice a day, amitriptyline 100 mg daily at bedtime,   #FEN:  -Diet: Renal/Carb Modified  #DVT prophylaxis: Heparin 5000 units 3 times a day  #CODE STATUS: FULL CODE -Defer to daughter Carolanne Grumbling [phone] if patients lacks decision-making capacity -Confirmed with patient on admission   Dispo: Disposition is deferred at this time, awaiting improvement of current medical problems.   The patient does have a current PCP ([name]) and does not need an [facility] hospital follow-up appointment after discharge.  The patient does not know have transportation limitations that hinder transportation to clinic appointments.  Signed: Charlott Rakes,  MD 11/21/2014, 1:23 AM

## 2014-11-21 NOTE — Consult Note (Signed)
Cardiology Consultation Note  Patient ID: [name] [name], MRN: [id number], DOB/AGE: [birth year] 59 y.o. Admit date: 11/20/2014   Date of Consult: 11/21/2014 Primary Physician: [name] Primary Cardiologist: Alanda Amass in [place] - but moving to [facility]  Chief Complaint: chest pain Reason for Consultation: chest pain  HPI: [name] [name] is a 59 y/o M with history of CAD (s/p BMS to mid LAD 12/2009 and DES to mid LAD 10/2010 per [facility]), HTN, DM, depression, anemia, h/o hematochezia/hematuria in [id number], chronic diastolic CHF with volume managed by HD due to ESRD who presented to [facility] with chest pain. Dr. Johnsie Cancel reviewed the patient's chart and found somewhere in [place] that the patient was apparently also started on apixaban in the past for possible stroke. Per discharge summary from [facility] earlier this month the patient reported that "an MD in [place], not a Cardiologist changed him from Plavix to what he thinks is Apixaban 2/2 hematuria and an enlarged prostate." This is no longer on his med list. He has been living between [place] and [place] and receiving medical care in [place], but is now planning to stay in [place].  Yesterday towards the end of dialysis he developed midsternal sharp chest pain. This was associated with dyspnea, nausea and vomiting. He took Tylenol but pain did not improve thus he sought care in the ER. He has been having recurrent intermittent chest pain for the past several years. He was admitted earlier this month for an episode of chest pain which was felt related to volume overload. Here in the ER with this episode, symptoms improved with 324mg  ASA, SL NTG and IV morphine. Apparently he's had issues with his mouth breaking out from NTG in the past but not every time. He also has had a 50lb unintentional weight loss. Denies any recurrent bleeding. Labwork generally benign except known renal insufficiency, troponins negative x 4 with normal  BNP. CXR NAD. He denies any recent tobacco, alcohol or illit drug use. He apparently had a cath in 2013 which did not show anything significant. Workup at [facility] in 04/2014 showed normal nuc with EF 50%, 2D echo at the same time with mild LVH, EF 60-65%, grade 2 DD, mild MR. More recent nuclear stress test 08/2014 (via [facility]) - normal SPECT Cardiolite study without convincing evidence for infarction or ischemia, EF 49% with mild global HK.  Past Medical History  Diagnosis Date  . Hypertension   . ESRD on dialysis   . Diabetes mellitus with nephropathy   . Hemodialysis patient   . Hematochezia     a. 2014: colonscopy, which showed moderately-sized internal hemorrhoids, two 31mm polyps in transverse colon and ascending colon that were resected, five 2-26mm polyps in sigmoid colon, descending colon, transverse colon, and ascending colon that were resected. An upper endoscopy was performed and showed normal esophagus, stomach, and duodenum.  . Hematuria     a. H/o hematuria 2014 with cystoscopy that was unrevealing for his source of hematuria. He underwent a kidney ultrasound on 10/14 that showed mildly echogenic and scarred kidneys compatible with medical renal disease, without hydronephrosis or renal calculi.  [name] Anemia   . Depression   . CAD (coronary artery disease)     a. per [facility] s/p BMS to mid LAD 12/2009 and DES to mid LAD 10/2010.  . Colon polyps   . Chronic diastolic CHF (congestive heart failure)       Most Recent Cardiac Studies: See above   Surgical History: History reviewed. No pertinent  past surgical history.   Home Meds: Prior to Admission medications   Medication Sig Start Date End Date Taking? Authorizing Provider  amitriptyline (ELAVIL) 100 MG tablet Take 100 mg by mouth at bedtime.   Yes Historical Provider, MD  amLODipine (NORVASC) 10 MG tablet Take 10 mg by mouth daily.   Yes Historical Provider, MD  aspirin EC 81 MG tablet Take 81 mg by mouth daily.   Yes  Historical Provider, MD  atorvastatin (LIPITOR) 40 MG tablet Take 40 mg by mouth daily.   Yes Historical Provider, MD  calcium acetate (PHOSLO) 667 MG capsule Take 2 capsules (1,334 mg total) by mouth 3 (three) times daily with meals. 11/10/14  Yes Albin Felling, MD  carvedilol (COREG) 25 MG tablet Take 50 mg by mouth 2 (two) times daily with a meal.   Yes Historical Provider, MD  cinacalcet (SENSIPAR) 30 MG tablet Take 60 mg by mouth at bedtime.   Yes Historical Provider, MD  insulin aspart (NOVOLOG) 100 UNIT/ML injection Inject 15-20 Units into the skin 3 (three) times daily before meals.   Yes Historical Provider, MD  Ipratropium-Albuterol (COMBIVENT RESPIMAT) 20-100 MCG/ACT AERS respimat Inhale 2 puffs into the lungs every 6 (six) hours as needed for wheezing.   Yes Historical Provider, MD  nitroGLYCERIN (NITROSTAT) 0.4 MG SL tablet Place 0.4 mg under the tongue every 5 (five) minutes as needed for chest pain.   Yes Historical Provider, MD  pregabalin (LYRICA) 100 MG capsule Take 100 mg by mouth 3 (three) times daily.   Yes Historical Provider, MD  sevelamer carbonate (RENVELA) 800 MG tablet Take 1,600-2,400 mg by mouth 3 (three) times daily with meals. *takes 2400mg  in the morning, 1600mg  midday, and 2400mg  at night*   Yes Historical Provider, MD  venlafaxine (EFFEXOR) 37.5 MG tablet Take 37.5 mg by mouth 2 (two) times daily.   Yes Historical Provider, MD    Inpatient Medications:  . amitriptyline  100 mg Oral QHS  . amLODipine  10 mg Oral Daily  . aspirin EC  81 mg Oral Daily  . atorvastatin  40 mg Oral Daily  . calcium acetate  1,334 mg Oral TID WC  . carvedilol  50 mg Oral BID WC  . cinacalcet  60 mg Oral QHS  . clopidogrel  75 mg Oral Daily  . [START ON 11/23/2014] doxercalciferol  2 mcg Intravenous Q T,Th,Sa-HD  . [START ON 11/26/2014] ferric gluconate (FERRLECIT/NULECIT) IV  62.5 mg Intravenous Weekly  . heparin  5,000 Units Subcutaneous 3 times per day  . insulin aspart  0-9 Units  Subcutaneous TID WC  . pantoprazole  40 mg Oral Daily  . pregabalin  100 mg Oral TID  . sevelamer carbonate  1,600 mg Oral Q1200  . sevelamer carbonate  2,400 mg Oral BID WC  . venlafaxine  37.5 mg Oral BID      Allergies:  Allergies  Allergen Reactions  . Enalapril Hives    History   Social History  . Marital Status: Unknown    Spouse Name: N/A  . Number of Children: N/A  . Years of Education: N/A   Occupational History  . Not on file.   Social History Main Topics  . Smoking status: Never Smoker   . Smokeless tobacco: Never Used  . Alcohol Use: No  . Drug Use: No  . Sexual Activity: Not on file   Other Topics Concern  . Not on file   Social History Narrative     Family History  Problem Relation Age of Onset  . Hypertension       Review of Systems: All other systems reviewed and are otherwise negative except as noted above.  Labs:  Recent Labs  11/21/14 0355 11/21/14 0635 11/21/14 1137  TROPONINI <0.03 <0.03 <0.03   Lab Results  Component Value Date   WBC 6.4 11/20/2014   HGB 13.1 11/20/2014   HCT 41.0 11/20/2014   MCV 84.7 11/20/2014   PLT 193 11/20/2014     Recent Labs Lab 11/20/14 2245  NA 138  K 4.5  CL 95*  CO2 30  BUN 24*  CREATININE 6.70*  CALCIUM 9.4  GLUCOSE 81   Lab Results  Component Value Date   CHOL 97 11/09/2014   HDL 23* 11/09/2014   LDLCALC 57 11/09/2014   TRIG 84 11/09/2014   Lab Results  Component Value Date   DDIMER <0.27 04/27/2014    Radiology/Studies:  Dg Chest 2 View  11/20/2014   CLINICAL DATA:  Chest pain today.  Initial encounter.  EXAM: CHEST  2 VIEW  COMPARISON:  11/09/2014  FINDINGS: There is mild stable left hemidiaphragm elevation. Pulmonary vasculature has essentially normalized since the study of 11/09/2014. Lungs are clear. There are no pleural effusions. Hilar, mediastinal and cardiac contours are unremarkable.  IMPRESSION: No active cardiopulmonary disease.   Electronically Signed   By:  Andreas Newport M.D.   On: 11/20/2014 23:01   Dg Chest Port 1 View  11/09/2014   CLINICAL DATA:  Chest pain and difficulty breathing  EXAM: PORTABLE CHEST - 1 VIEW  COMPARISON:  April 26, 2014  FINDINGS: There is stable elevation of the left hemidiaphragm. The interstitium is prominent, probably representing chronic interstitial edema. Heart is mildly enlarged, and there is slight pulmonary venous hypertension. No adenopathy. No airspace consolidation. No bone lesions.  IMPRESSION: Findings felt to be consistent with a degree of chronic congestive heart failure. No appreciable change from prior study. No airspace consolidation.   Electronically Signed   By: Lowella Grip M.D.   On: 11/09/2014 07:02    Wt Readings from Last 3 Encounters:  11/21/14 210 lb 15.7 oz (95.7 kg)  11/09/14 214 lb 15.2 oz (97.5 kg)  04/26/14 242 lb 3.2 oz (109.861 kg)    EKG: NSR LVH no acute ST-T changes  Physical Exam: Blood pressure 169/90, pulse 92, temperature 97.5 F (36.4 C), temperature source Oral, resp. rate 18, weight 210 lb 15.7 oz (95.7 kg), SpO2 100 %. General: Well developed, well nourished M in no acute distress. Head: Normocephalic, atraumatic, sclera non-icteric, no xanthomas, nares are without discharge.  Neck: JVD not elevated. Lungs: Clear bilaterally to auscultation without wheezes, rales, or rhonchi. Breathing is unlabored. Heart: RRR with S1 S2. Continuous murmur likely due to fistula Abdomen: Soft, non-tender, non-distended with normoactive bowel sounds. No hepatomegaly. No rebound/guarding. No obvious abdominal masses. Msk:  Strength and tone appear normal for age. Extremities: No clubbing or cyanosis. No edema.  Distal pedal pulses in tact. L brachiocephalic AV fistula with palpable thrill. Various tattoos. Neuro: Alert and oriented X 3. No facial asymmetry. No focal deficit. Moves all extremities spontaneously. Psych:  Responds to questions appropriately with a normal affect.     Assessment and Plan:  1. Recurrent chest pain 2. CAD s/p PCI as above 3. Essential HTN with elevated BP 4. ESRD on HD 5. ? Unclear history of stroke 6. Unintentional weight loss 7. Diabetes mellitus  The patient continues to have recurrent chest discomfort that is vague.  Given known coronary disease and recent unrevealing noninvasive testing, Dr. Johnsie Cancel recommends cardiac cath for further evaluation. Doubtful this will be able to be done tomorrow due to full schedule but would anticipate possibly on Tuesday. Will need orders for this tomorrow if he remains stable. Per Dr. Johnsie Cancel, check [phone] and resume Plavix. Continue ASA, statin, BB. He is on higher than max dose Coreg but will continue for now. Consider addition of hydralazine for blood pressure if OK with renal.  Note he is no longer on apixaban - will ask Dr. Johnsie Cancel to comment below the information he found regarding the patient being placed on this at ?[place].  Signed, Melina Copa PA-C 11/21/2014, 2:22 PM  Patient examined chart including extensive records from [facility] reviewed.  CRF on dialysis.  A bit of a wanderer now indicating he is going to be living with son in [place].  Primary heart doctor has been [place] in [place].  But has been seen there, [facility], [facility] and [place].  CAD with BMS mid LAD 2011, DES mid LAD 2012  Cath 2013 patient stents and no disease in RCA/Circ.  Normal myovue EF 49% at end of November in [place] However he keeps complaining of SSCP frequently with dialysis .  Has chronically elevated low level troponin.  Discussed with patient and favor diagnostic cath Tuesday.  Check P2Y and start plavix back  Was seen last month and sent to [facility] for ? Stroke but CT negative  Not clear if he was started on eliqis then but has not been taking anyway.  Is now established with [facility] and Dr Mercy Moore renal.  Exam remarkable for overweight black male with tattos .  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   Continuous murmur over heart from fistula with no rub.  Patient in agreement with cath Tuesday put on board.   Jenkins Rouge

## 2014-11-21 NOTE — Consult Note (Signed)
[name] Renal Consultation Note  Indication for Consultation:  Management of ESRD/hemodialysis; anemia, hypertension/volume and secondary hyperparathyroidism  HPI: [name] [name] is a 59 y.o. male with a history of Diabetes Type 2, hypertension, CAD s/p stents at [facility], and ESRD on dialysis at the [facility] who developed midsternal chest pain yesterday during the last hour of dialysis, which was stopped after 3:37, had associated mild dyspnea, nausea and vomiting after returning home, and after no improvement with aspirin and Tylenol, decided to come to the hospital last night. He reports that his pain does not radiate and is similar to many episodes over the last few years, but notes that symptoms were much worse when he required stents.  He has been on dialysis since 2008, but recently moved from [place] and was hospitalized 2/2-3 at [facility] for dyspnea and chest pain, believed to be secondary to volume overload, before starting dialysis at the [facility] center.  He left dialysis yesterday 1 kg below his current dry weight, but reports recent weight loss.  His chest x-ray today shows questionable edema..  His pain improved with sublingual nitroglycerin and IV morphine in the ED, but he had some nausea and vomiting this morning.  Past Medical History  Diagnosis Date  . Hypertension   . Renal disorder   . Diabetes mellitus without complication   . Hemodialysis patient    History reviewed. No pertinent past surgical history. No family history on file.  Social History  He quit cigarettes five years ago after using one pack every 2-3 days, but denies any history of alcohol or illicit drug use.  He previously worked as a Training and development officer, is divorced, and recently moved in with his son.  Allergies  Allergen Reactions  . Enalapril Hives   Prior to Admission medications   Medication Sig Start Date End Date Taking? Authorizing Provider  amitriptyline (ELAVIL) 100 MG tablet  Take 100 mg by mouth at bedtime.   Yes Historical Provider, MD  amLODipine (NORVASC) 10 MG tablet Take 10 mg by mouth daily.   Yes Historical Provider, MD  aspirin EC 81 MG tablet Take 81 mg by mouth daily.   Yes Historical Provider, MD  atorvastatin (LIPITOR) 40 MG tablet Take 40 mg by mouth daily.   Yes Historical Provider, MD  calcium acetate (PHOSLO) 667 MG capsule Take 2 capsules (1,334 mg total) by mouth 3 (three) times daily with meals. 11/10/14  Yes Albin Felling, MD  carvedilol (COREG) 25 MG tablet Take 50 mg by mouth 2 (two) times daily with a meal.   Yes Historical Provider, MD  cinacalcet (SENSIPAR) 30 MG tablet Take 60 mg by mouth at bedtime.   Yes Historical Provider, MD  insulin aspart (NOVOLOG) 100 UNIT/ML injection Inject 15-20 Units into the skin 3 (three) times daily before meals.   Yes Historical Provider, MD  Ipratropium-Albuterol (COMBIVENT RESPIMAT) 20-100 MCG/ACT AERS respimat Inhale 2 puffs into the lungs every 6 (six) hours as needed for wheezing.   Yes Historical Provider, MD  nitroGLYCERIN (NITROSTAT) 0.4 MG SL tablet Place 0.4 mg under the tongue every 5 (five) minutes as needed for chest pain.   Yes Historical Provider, MD  pregabalin (LYRICA) 100 MG capsule Take 100 mg by mouth 3 (three) times daily.   Yes Historical Provider, MD  sevelamer carbonate (RENVELA) 800 MG tablet Take 1,600-2,400 mg by mouth 3 (three) times daily with meals. *takes 2459m in the morning, 16078mmidday, and 240054mt night*   Yes  Historical Provider, MD  venlafaxine (EFFEXOR) 37.5 MG tablet Take 37.5 mg by mouth 2 (two) times daily.   Yes Historical Provider, MD   Labs:  Results for orders placed or performed during the hospital encounter of 11/20/14 (from the past 48 hour(s))  CBC     Status: Abnormal   Collection Time: 11/20/14 10:45 PM  Result Value Ref Range   WBC 6.4 4.0 - 10.5 K/uL   RBC 4.84 4.22 - 5.81 MIL/uL   Hemoglobin 13.1 13.0 - 17.0 g/dL   HCT 41.0 39.0 - 52.0 %   MCV 84.7  78.0 - 100.0 fL   MCH 27.1 26.0 - 34.0 pg   MCHC 32.0 30.0 - 36.0 g/dL   RDW 16.2 (H) 11.5 - 15.5 %   Platelets 193 150 - 400 K/uL  Basic metabolic panel     Status: Abnormal   Collection Time: 11/20/14 10:45 PM  Result Value Ref Range   Sodium 138 135 - 145 mmol/L   Potassium 4.5 3.5 - 5.1 mmol/L   Chloride 95 (L) 96 - 112 mmol/L   CO2 30 19 - 32 mmol/L   Glucose, Bld 81 70 - 99 mg/dL   BUN 24 (H) 6 - 23 mg/dL   Creatinine, Ser 6.70 (H) 0.50 - 1.35 mg/dL   Calcium 9.4 8.4 - 10.5 mg/dL   GFR calc non Af Amer 8 (L) >90 mL/min   GFR calc Af Amer 10 (L) >90 mL/min    Comment: (NOTE) The eGFR has been calculated using the CKD EPI equation. This calculation has not been validated in all clinical situations. eGFR's persistently <90 mL/min signify possible Chronic Kidney Disease.    Anion gap 13 5 - 15  BNP (order ONLY if patient complains of dyspnea/SOB AND you have documented it for THIS visit)     Status: None   Collection Time: 11/20/14 10:45 PM  Result Value Ref Range   B Natriuretic Peptide 75.6 0.0 - 100.0 pg/mL  I-stat troponin, ED (not at [facility])     Status: None   Collection Time: 11/20/14 11:04 PM  Result Value Ref Range   Troponin i, poc 0.01 0.00 - 0.08 ng/mL   Comment 3            Comment: Due to the release kinetics of cTnI, a negative result within the first hours of the onset of symptoms does not rule out myocardial infarction with certainty. If myocardial infarction is still suspected, repeat the test at appropriate intervals.   Troponin I-serum (0, 3, 6 hours)     Status: None   Collection Time: 11/21/14  3:55 AM  Result Value Ref Range   Troponin I <0.03 <0.031 ng/mL    Comment:        NO INDICATION OF MYOCARDIAL INJURY.   Troponin I-serum (0, 3, 6 hours)     Status: None   Collection Time: 11/21/14  6:35 AM  Result Value Ref Range   Troponin I <0.03 <0.031 ng/mL    Comment:        NO INDICATION OF MYOCARDIAL INJURY.   Glucose, capillary     Status:  None   Collection Time: 11/21/14  6:36 AM  Result Value Ref Range   Glucose-Capillary 89 70 - 99 mg/dL   Comment 1 Notify RN    Constitutional: positive for fatigue, weight loss; negative for chills, fevers and sweats Ears, nose, mouth, throat, and face: negative for earaches, hoarseness, nasal congestion and sore throat Respiratory: positive for  mild dyspnea; negative for cough, hemoptysis and sputum Cardiovascular: positive for chest pain and dyspnea, negative for orthopnea and palpitations Gastrointestinal: positive for nausea and vomiting, negative for abdominal pain and change in bowel habits Genitourinary:negative, oliguric Musculoskeletal:negative for arthralgias, back pain, myalgias and neck pain Neurological: positive for mild dizziness and headache; negative for gait problems, paresthesia and speech problems  Physical Exam: Filed Vitals:   11/21/14 0742  BP: 169/90  Pulse: 92  Temp: 97.5 F (36.4 C)  Resp:      General appearance: alert, cooperative and no distress Head: Normocephalic, without obvious abnormality, atraumatic Neck: no adenopathy, no carotid bruit, no JVD and supple, symmetrical, trachea midline Resp: clear to auscultation bilaterally Cardio: regular rate and rhythm, S1, S2 normal, no murmur, click, rub or gallop GI: soft, non-tender; bowel sounds normal; no masses,  no organomegaly Extremities: extremities normal, atraumatic, no cyanosis or edema Neurologic: Grossly normal Dialysis Access: AVF @ LUA with + bruit   Dialysis Orders:  TTS @ [facility] 4 hrs    90 kg     2K/2.25Ca     500/800      Heparin 8000 U      AVF @ LUA Hectorol 2 mcg          Aranesp 40 mcg on Tues        Venofer 50 mg on Fri  Assessment/Plan: 1. Chest pain / Dyspnea - better after SL NTG; EKG stable, troponins negative; Hx CAD s/p stents x 2 @ [facility]. 2. ESRD - HD on TTS @ [facility], K 4.5.  Extra HD tomorrow. 3. Hypertension/volume - BP 169/90, on Amlodipine 10 mg qd & Carvedilol 50 mg bid; wt  95.7 kg today, but 89 (below EDW) post-HD yesterday.  Extra HD tomorrow for fluid removal. 4. Anemia - Hgb 13.1, Aranesp 40 mcg on Tues, Fe on Fri.  Hold Aranesp. 5. Metabolic bone disease - Ca 9.4, iPTH 247; Hectorol 2 mcg, Sensipar 60 mg qd, Renvela & Phoslo with meals.  6. Nutrition - renal carb-mod diet, vitamin.  7. DM Type 2 - insulin per primary 8. COPD - on inhaler  LYLES,CHARLES 11/21/2014, 10:56 AM   Attending Nephrologist:   Roney Jaffe, MD  Pt seen, examined and agree w A/P as above. Plan extra HD tomorrow for suspected early pulm edema due to vol excess.  Kelly Splinter MD pager [phone]    cell [phone] 11/21/2014, 7:28 PM

## 2014-11-21 NOTE — Progress Notes (Addendum)
Subjective: Patient reports he is still having chest pain this morning. He reports the pain got better with nitro and morphine. He states he sometimes get chest pain while at rest, but also with exertion. He reports associated nausea, dyspnea, dizziness, and palpitations. He denies any history of GERD or taking acid reflux medications.   In regards to his weight loss he states he has lost even more weight. He reports night sweats. He denies any dark stool or bloody stool. He states he had a colonoscopy a few years ago and polyps were removed. He also had an EGD at that time which was normal.   Objective: Vital signs in last 24 hours: Filed Vitals:   11/21/14 0232 11/21/14 0254 11/21/14 0552 11/21/14 0742  BP:  176/83 164/84 169/90  Pulse:  85 90 92  Temp: 97.8 F (36.6 C) 97.9 F (36.6 C)  97.5 F (36.4 C)  TempSrc: Oral Oral  Oral  Resp:  18    Weight:  210 lb 15.7 oz (95.7 kg)    SpO2:  100% 99% 100%   Weight change:  No intake or output data in the 24 hours ending 11/21/14 0859  Physical Exam General: alert, sitting up in bed, NAD HEENT: [place]/AT, EOMI, sclera anicteric, mucus membranes moist Neck: supple, no JVD, no lymphadenopathy CV: RRR, continuous murmur present Pulm: CTA bilaterally, breaths non-labored Abd: BS+, soft, non-tender Ext: warm, no edema, moves all. Left brachiocephalic AVF with palpable thrill.  Neuro: alert and oriented x 3, no focal deficits   Lab Results: Basic Metabolic Panel:  Recent Labs Lab 11/20/14 2245  NA 138  K 4.5  CL 95*  CO2 30  GLUCOSE 81  BUN 24*  CREATININE 6.70*  CALCIUM 9.4   CBC:  Recent Labs Lab 11/20/14 2245  WBC 6.4  HGB 13.1  HCT 41.0  MCV 84.7  PLT 193   Cardiac Enzymes:  Recent Labs Lab 11/21/14 0355 11/21/14 0635  TROPONINI <0.03 <0.03   CBG:  Recent Labs Lab 11/21/14 0636  GLUCAP 89   Studies/Results: Dg Chest 2 View  11/20/2014   CLINICAL DATA:  Chest pain today.  Initial encounter.  EXAM:  CHEST  2 VIEW  COMPARISON:  11/09/2014  FINDINGS: There is mild stable left hemidiaphragm elevation. Pulmonary vasculature has essentially normalized since the study of 11/09/2014. Lungs are clear. There are no pleural effusions. Hilar, mediastinal and cardiac contours are unremarkable.  IMPRESSION: No active cardiopulmonary disease.   Electronically Signed   By: Andreas Newport M.D.   On: 11/20/2014 23:01   Medications: I have reviewed the patient's current medications. Scheduled Meds: . amitriptyline  100 mg Oral QHS  . amLODipine  10 mg Oral Daily  . aspirin EC  81 mg Oral Daily  . atorvastatin  40 mg Oral Daily  . calcium acetate  1,334 mg Oral TID WC  . carvedilol  50 mg Oral BID WC  . cinacalcet  60 mg Oral QHS  . heparin  5,000 Units Subcutaneous 3 times per day  . insulin aspart  0-9 Units Subcutaneous TID WC  . pantoprazole  40 mg Oral Daily  . pregabalin  100 mg Oral TID  . sevelamer carbonate  1,600 mg Oral Q1200  . sevelamer carbonate  2,400 mg Oral BID WC  . venlafaxine  37.5 mg Oral BID   Continuous Infusions:  PRN Meds:.acetaminophen, gi cocktail, ipratropium-albuterol, morphine injection, nitroGLYCERIN, ondansetron (ZOFRAN) IV Assessment/Plan:  Chest Pain: Patient with substernal chest pain that improves  with nitro and morphine. Pain occurred at rest and patient states he also has pain on exertion. Pain is associated with dyspnea, nausea, dizziness and palpitations. He denies any history of GERD and EGD normal in Oct 2014 (see below). This history is concerning for unstable angina. Patient had a recent Cardiolite study on 09/04/14 which was normal and showed no evidence for infarction or ischemia. Study showed EF 49% and mild global hypokinesis. Troponins have been negative x 2. EKG with normal sinus rhythm and appears unchanged from prior EKG. Pain improved last admission after HD, but this does not appear to be the case this time.  - Consider cardiology consult - Trend  troponins - Continue nitro and miorphine PRN - Try GI cocktail, see if improves pain  Unintentional Weight Loss: Patient reports additional weight loss since last hospitalization, stating he is down to 205 lbs. He also reports night sweats. He denies any dark or bloody stool. No lymphadenopathy noted on exam. He had reported a 36 lb weight loss since July 2015 during his last hospitalization on 11/09/14. His weight appears stable since last hospitalization at 210 lbs. Per care everywhere, his weight was 260 lbs in April 2014 then dropped to 240 lbs in August 2015. By Nov 2015 his weight dropped significantly to 217 lbs and then 208 lbs in Dec 2015. No mention of weight loss in prior notes. Patient was hospitalized for hematochezia at [facility] in October 2014. A colonoscopy was performed where two 2mm polyps were removed from the transverse and ascending colon and five 2-3 mm polyps removed from the sigmoid, descending, transverse, and ascending colon. Cannot find path report for these polyps. He also had internal hemorrhoids which were determined to be the source of his bleeding. He also had an EGD at that time which was normal. He had also been hospitalized for hematuria in July 2014 and CT urogram was normal. Patient does have a smoking history and warrants further malignancy work up as an outpatient given his weight loss. Potential sources include lung and prostate.  - Set up PCP appointment   ESRD on HD TThS: Patient last had dialysis on Saturday, 11/20/14.  - Consult Renal for HD - Continue home Renvela 1600-2400 mg TID  - Continue Phoslo 1, 334 mg TID - Continue Sensipar 60 mg QHS  CAD: Patient had stents placed to mid LAD in March 2011 (bare metal) and Jan 2012 (DES). He underwent a cardiac cath in Dec 2013 which showed 50% mid LAD stenosis and overall was unchanged from his prior study in 2012. He takes aspirin 81 mg daily, amlodipine 10 mg daily, coreg 50 mg BID, and lipitor 40 mg daily at home.  -  Continue ASA 81 mg daily - Continue Amlodipine 10 mg daily - Continue Coreg 50 mg BID - Continue Lipitor 40 mg daily   Type 2 DM: Last HbA1c 5.5 on 11/09/14. Continue Lyrica for neuropathic pain.  - Continue Lyrica 100 mg TID  HTN: BP elevated in [id number] systolic. Does not appear fluid overloaded on exam. Could be elevated due to pain.  - Continue Amlodipine 10 mg daily - Continue Coreg 50 mg BID  COPD: - Continue Combivent 2 puffs Q6H PRN wheezing   Depression: - Continue Amitriptyline 100 mg QHS - Continue Effexor 37.5 mg BID  Diet: Renal/Carb modified with 1200 ml fluid restriction  VTE PPx: Heparin SQ Dispo: Disposition is deferred at this time, awaiting improvement of current medical problems.  Anticipated discharge in approximately 1-2 day(s).  The patient does have a current PCP ([name]) and does need an [facility] hospital follow-up appointment after discharge.  The patient does not have transportation limitations that hinder transportation to clinic appointments.  .Services Needed at time of discharge: Y = Yes, Blank = No PT:   OT:   RN:   Equipment:   Other:     LOS: 0 days   Albin Felling, MD 11/21/2014, 8:59 AM

## 2014-11-22 DIAGNOSIS — E119 Type 2 diabetes mellitus without complications: Secondary | ICD-10-CM

## 2014-11-22 DIAGNOSIS — F329 Major depressive disorder, single episode, unspecified: Secondary | ICD-10-CM

## 2014-11-22 DIAGNOSIS — I25700 Atherosclerosis of coronary artery bypass graft(s), unspecified, with unstable angina pectoris: Secondary | ICD-10-CM

## 2014-11-22 DIAGNOSIS — I209 Angina pectoris, unspecified: Secondary | ICD-10-CM

## 2014-11-22 DIAGNOSIS — J449 Chronic obstructive pulmonary disease, unspecified: Secondary | ICD-10-CM

## 2014-11-22 DIAGNOSIS — D631 Anemia in chronic kidney disease: Secondary | ICD-10-CM

## 2014-11-22 LAB — RENAL FUNCTION PANEL
ALBUMIN: 3.4 g/dL — AB (ref 3.5–5.2)
ANION GAP: 14 (ref 5–15)
BUN: 40 mg/dL — AB (ref 6–23)
CO2: 31 mmol/L (ref 19–32)
CREATININE: 9.71 mg/dL — AB (ref 0.50–1.35)
Calcium: 9.1 mg/dL (ref 8.4–10.5)
Chloride: 93 mmol/L — ABNORMAL LOW (ref 96–112)
GFR calc non Af Amer: 5 mL/min — ABNORMAL LOW (ref >90)
GFR, EST AFRICAN AMERICAN: 6 mL/min — AB (ref >90)
Glucose, Bld: 86 mg/dL (ref 70–99)
PHOSPHORUS: 9.8 mg/dL — AB (ref 2.3–4.6)
POTASSIUM: 4.7 mmol/L (ref 3.5–5.1)
Sodium: 138 mmol/L (ref 135–145)

## 2014-11-22 LAB — CBC
HEMATOCRIT: 37.1 % — AB (ref 39.0–52.0)
Hemoglobin: 11.8 g/dL — ABNORMAL LOW (ref 13.0–17.0)
MCH: 26.4 pg (ref 26.0–34.0)
MCHC: 31.8 g/dL (ref 30.0–36.0)
MCV: 83 fL (ref 78.0–100.0)
Platelets: 174 10*3/uL (ref 150–400)
RBC: 4.47 MIL/uL (ref 4.22–5.81)
RDW: 16.2 % — ABNORMAL HIGH (ref 11.5–15.5)
WBC: 5.2 10*3/uL (ref 4.0–10.5)

## 2014-11-22 LAB — GLUCOSE, CAPILLARY
GLUCOSE-CAPILLARY: 77 mg/dL (ref 70–99)
Glucose-Capillary: 123 mg/dL — ABNORMAL HIGH (ref 70–99)
Glucose-Capillary: 97 mg/dL (ref 70–99)
Glucose-Capillary: 132 mg/dL — ABNORMAL HIGH (ref 70–99)

## 2014-11-22 MED ORDER — NEPRO/CARBSTEADY PO LIQD
237.0000 mL | ORAL | Status: DC | PRN
  Filled 2014-11-22: qty 237

## 2014-11-22 MED ORDER — LIDOCAINE HCL (PF) 1 % IJ SOLN: 5.0000 mL | INTRAMUSCULAR | Status: DC | PRN

## 2014-11-22 MED ORDER — ALBUMIN HUMAN 25 % IV SOLN
INTRAVENOUS | Status: AC
  Administered 2014-11-22: 12.5 g via INTRAVENOUS
  Filled 2014-11-22: qty 50

## 2014-11-22 MED ORDER — ALBUMIN HUMAN 25 % IV SOLN
12.5000 g | Freq: Once | INTRAVENOUS | Status: AC
  Administered 2014-11-22: 12.5 g via INTRAVENOUS

## 2014-11-22 MED ORDER — HEPARIN SODIUM (PORCINE) 1000 UNIT/ML DIALYSIS
1000.0000 [IU] | INTRAMUSCULAR | Status: DC | PRN
  Filled 2014-11-22: qty 1

## 2014-11-22 MED ORDER — SODIUM CHLORIDE 0.9 % IV SOLN: 100.0000 mL | INTRAVENOUS | Status: DC | PRN

## 2014-11-22 MED ORDER — LIDOCAINE-PRILOCAINE 2.5-2.5 % EX CREA: 1.0000 "application " | TOPICAL_CREAM | CUTANEOUS | Status: DC | PRN

## 2014-11-22 MED ORDER — PENTAFLUOROPROP-TETRAFLUOROETH EX AERO: 1.0000 "application " | INHALATION_SPRAY | CUTANEOUS | Status: DC | PRN

## 2014-11-22 MED ORDER — AMLODIPINE BESYLATE 5 MG PO TABS
5.0000 mg | ORAL_TABLET | Freq: Every day | ORAL | Status: DC
  Administered 2014-11-23 – 2014-11-24 (×2): 5 mg via ORAL
  Filled 2014-11-22 (×2): qty 1

## 2014-11-22 MED ORDER — ALTEPLASE 2 MG IJ SOLR
2.0000 mg | Freq: Once | INTRAMUSCULAR | Status: DC | PRN
  Filled 2014-11-22: qty 2

## 2014-11-22 NOTE — Progress Notes (Signed)
  Date: 11/22/2014  Patient name: [name] [name]  Medical record number: [id number]  Date of birth: [birth year]   This patient has been seen and the plan of care was discussed with the house staff. Please see their note for complete details. I concur with their findings with the following additions/corrections: [name] [name] was seen in HD with Dr Naaman Plummer. He denies CP. His BP was 62/44 early this AM pre HD and renal concerned he will not tolerate additional fluid removal 2/2 hypotension. Their is concern for compliance at home so maybe he doesn't take is 10 of norvac and his coreg but he does get it here. Will talk to team about decreasing norvasc. Difficult bc has also been as high as 185/95. For cath tomorrow.   Bartholomew Crews, MD 11/22/2014, 10:12 AM

## 2014-11-22 NOTE — Progress Notes (Addendum)
Subjective:  Pt seen and examined in AM. No acute events overnight. He reports he no longer has chest pain. He is unsure if his symptoms improved after GI cocktail and protonix.    Objective: Vital signs in last 24 hours: Filed Vitals:   11/22/14 0900 11/22/14 0930 11/22/14 1000 11/22/14 1030  BP: 104/64 108/58 103/65 98/76  Pulse: 63 66 66 71  Temp:      TempSrc:      Resp:      Weight:      SpO2:       Weight change: -9 lb 14.7 oz (-4.5 kg)  Intake/Output Summary (Last 24 hours) at 11/22/14 1054 Last data filed at 11/21/14 2346  Gross per 24 hour  Intake    924 ml  Output      0 ml  Net    924 ml    Physical Exam General: alert, in HD, NAD CV: RRR, continuous murmur present Pulm: CTA bilaterally, breaths non-labored Abd: BS+, soft, non-tender Ext: warm, no edema, moves all. Left brachiocephalic AVF with palpable thrill. Tattoos.  Neuro: alert and oriented x 3, no focal deficits   Lab Results: Basic Metabolic Panel:  Recent Labs Lab 11/20/14 2245 11/22/14 0519  NA 138 138  K 4.5 4.7  CL 95* 93*  CO2 30 31  GLUCOSE 81 86  BUN 24* 40*  CREATININE 6.70* 9.71*  CALCIUM 9.4 9.1  PHOS  --  9.8*   Liver Function Tests:  Recent Labs Lab 11/22/14 0519  ALBUMIN 3.4*   CBC:  Recent Labs Lab 11/20/14 2245 11/22/14 0519  WBC 6.4 5.2  HGB 13.1 11.8*  HCT 41.0 37.1*  MCV 84.7 83.0  PLT 193 174   Cardiac Enzymes:  Recent Labs Lab 11/21/14 0355 11/21/14 0635 11/21/14 1137  TROPONINI <0.03 <0.03 <0.03   CBG:  Recent Labs Lab 11/21/14 0636 11/21/14 1120 11/21/14 1626 11/21/14 2117 11/22/14 0641  GLUCAP 89 130* 101* 120* 77   Urine Drug Screen: Drugs of Abuse     Component Value Date/Time   LABOPIA NONE DETECTED 11/09/2014 0916   COCAINSCRNUR NONE DETECTED 11/09/2014 0916   LABBENZ NONE DETECTED 11/09/2014 0916   AMPHETMU NONE DETECTED 11/09/2014 0916   THCU NONE DETECTED 11/09/2014 0916   LABBARB NONE DETECTED 11/09/2014 0916       Studies/Results: Dg Chest 2 View  11/20/2014   CLINICAL DATA:  Chest pain today.  Initial encounter.  EXAM: CHEST  2 VIEW  COMPARISON:  11/09/2014  FINDINGS: There is mild stable left hemidiaphragm elevation. Pulmonary vasculature has essentially normalized since the study of 11/09/2014. Lungs are clear. There are no pleural effusions. Hilar, mediastinal and cardiac contours are unremarkable.  IMPRESSION: No active cardiopulmonary disease.   Electronically Signed   By: Andreas Newport M.D.   On: 11/20/2014 23:01   Medications: I have reviewed the patient's current medications. Scheduled Meds: . amitriptyline  100 mg Oral QHS  . amLODipine  10 mg Oral Daily  . aspirin EC  81 mg Oral Daily  . atorvastatin  40 mg Oral Daily  . calcium acetate  1,334 mg Oral TID WC  . carvedilol  50 mg Oral BID WC  . cinacalcet  60 mg Oral QHS  . clopidogrel  75 mg Oral Daily  . [START ON 11/23/2014] doxercalciferol  2 mcg Intravenous Q T,Th,Sa-HD  . [START ON 11/26/2014] ferric gluconate (FERRLECIT/NULECIT) IV  62.5 mg Intravenous Weekly  . heparin  5,000 Units Subcutaneous 3 times per  day  . insulin aspart  0-9 Units Subcutaneous TID WC  . pantoprazole  40 mg Oral Daily  . pregabalin  100 mg Oral TID  . sevelamer carbonate  1,600 mg Oral Q1200  . sevelamer carbonate  2,400 mg Oral BID WC  . venlafaxine  37.5 mg Oral BID   Continuous Infusions:  PRN Meds:.sodium chloride, sodium chloride, acetaminophen, alteplase, feeding supplement (NEPRO CARB STEADY), gi cocktail, heparin, ipratropium-albuterol, lidocaine (PF), lidocaine-prilocaine, morphine injection, nitroGLYCERIN, ondansetron (ZOFRAN) IV, pentafluoroprop-tetrafluoroeth Assessment/Plan: Principal Problem:   Chest pain Active Problems:   ESRD (end stage renal disease)   Hypertension   CAD (coronary artery disease) of artery bypass graft   Diabetes mellitus type 2, insulin dependent   Recurrent Chest Pain: Currently CP-free with negative  cardiac enzymes & no EKG changes.  Etiology unclear, concerning for unstable angina in setting of CAD s/p PCI x2.  Patient had a recent Cardiolite study on 09/04/14 which was normal and showed no evidence for infarction or ischemia. Study showed EF 49% and mild global hypokinesis. -Appreciate cardiology recommendations  -NPO after midnight for cardiac catherization tomorrow at 7:30 AM -Continue SL nitro 0.4 mg Q 5 min and morphine 2 mg Q 2 hr PRN pain -Continue aspirin 81 mg daily, amlodipine 10 mg daily, coreg 50 mg BID, and lipitor 40 mg daily -Continue trial of protonix 40 mg daily for GERD -P2Y12 level normal, pt started on plavix 75 mg daily without loading dose  ESRD on HD TTS - Tolerated HD but with soft BPs, no HD tomm for cath. Pt with secondary hyperparathyroidism with elevated PTH level of 247.  -Appreciate nephrology recommendations  -Continue home Renvela 1600-2400 mg TID  -Continue Phoslo 1, 334 mg TID -Continue Sensipar 60 mg QHS -Continue hectoral 2 mcg with HD  -Obtain acute hepatitis panel   CAD - Patient had stents placed to mid LAD in March 2011 (bare metal) and Jan 2012 (DES). He underwent a cardiac cath in Dec 2013 which showed 50% mid LAD stenosis and overall was unchanged from his prior study in 2012. He takes aspirin 81 mg daily, amlodipine 10 mg daily, coreg 50 mg BID, and lipitor 40 mg daily at home. Last lipid panel on 11/09/14 with LDL at goal of 57.  - Continue ASA 81 mg daily - Continue Amlodipine 10 mg daily - Continue Coreg 50 mg BID - Continue Lipitor 40 mg daily  -P2Y12 level normal, pt started on plavix 75 mg daily without loading dose  Anemia of Chronic Disease - Hg 11.8 down from 13.1 on admission near baseline 11 with no active bleeding or hemodynamic instability.  -Holding Aranesp 40 mcg Tue and IV ferric gluconate on Fridays -Obtain anemia panel -Monitor CBC -Monitor for bleeding  Non-InsulinType 2 DM - Last CBG 123.  Last HbA1c 5.5 on 11/09/14.  Continue Lyrica for neuropathic pain.  - Continue Lyrica 100 mg TID  HTN: Low of 62/44 with HD.  - Decrease amlodipine 10 mg daily to 5 mg daily  - Consider decreasing Coreg 50 mg BID to 25 mg BID  Unintentional Weight Loss - Patient reports additional weight loss since last hospitalization, stating he is down to 205 lbs. He also reports night sweats. He denies any dark or bloody stool. No lymphadenopathy noted on exam. He had reported a 36 lb weight loss since July 2015 during his last hospitalization on 11/09/14. His weight appears stable since last hospitalization at 210 lbs. Per care everywhere, his weight was 260 lbs in  April 2014 then dropped to 240 lbs in August 2015. By Nov 2015 his weight dropped significantly to 217 lbs and then 208 lbs in Dec 2015. No mention of weight loss in prior notes. Patient was hospitalized for hematochezia at [facility] in October 2014. A colonoscopy was performed where two 92mm polyps were removed from the transverse and ascending colon and five 2-3 mm polyps removed from the sigmoid, descending, transverse, and ascending colon. Cannot find path report for these polyps. He also had internal hemorrhoids which were determined to be the source of his bleeding. He also had an EGD at that time which was normal. He had also been hospitalized for hematuria in July 2014 and CT urogram was normal. Patient does have a smoking history and warrants further malignancy work up as an outpatient given his weight loss. Potential sources include lung and prostate.  -Needs PCP follow-up appointment   COPD - No acute exacerbation. - Continue duoneb 2 puffs Q6H PRN wheezing   Depression - Stable mood. Risk factor for CAD. - Continue Amitriptyline 100 mg QHS - Continue Effexor 37.5 mg BID  Diet: Renal/Carb modified with 1200 ml fluid restriction  VTE PPx: Heparin SQ Code: Full     Dispo: Disposition is deferred at this time, awaiting improvement of current medical problems.   Anticipated discharge in approximately 2 day(s).   The patient does not have a current PCP ([name]) and does need an [facility] hospital follow-up appointment after discharge.  The patient does not have transportation limitations that hinder transportation to clinic appointments.  .Services Needed at time of discharge: Y = Yes, Blank = No PT:   OT:   RN:   Equipment:   Other:     LOS: 1 day   Juluis Mire, MD 11/22/2014, 10:54 AM

## 2014-11-22 NOTE — Procedures (Signed)
I have personally attended this patient's dialysis session.   Dialysis off schedule today for volume and to facilitate cath tomorrow. Pre weight 91.3, EDW 90, last outpt weight 89, shooting for 88.5 today. 2K bath  No heparin BP already down [id number] systolic so may not tolerate the additional volume removal  Kaydyn Sayas B

## 2014-11-22 NOTE — Progress Notes (Addendum)
Patient: [name] [name] / Admit Date: 11/20/2014 / Date of Encounter: 11/22/2014, 10:47 AM   Subjective: No complaints. Had slight CP earlier but resolved.   Objective: Telemetry: NSR one PVC Physical Exam: Blood pressure 98/76, pulse 71, temperature 97.4 F (36.3 C), temperature source Oral, resp. rate 10, weight 201 lb 1 oz (91.2 kg), SpO2 97 %. General: Well developed, well nourished M in no acute distress. Laying flat for dialysis. Head: Normocephalic, atraumatic, sclera non-icteric, no xanthomas, nares are without discharge. Neck: JVD not elevated. Lungs: Clear bilaterally to auscultation without wheezes, rales, or rhonchi. Breathing is unlabored. Heart: RRR with S1 S2. Continuous murmur likely due to fistula Abdomen: Soft, non-tender, non-distended with normoactive bowel sounds. No hepatomegaly. No rebound/guarding. No obvious abdominal masses. Extremities: No clubbing or cyanosis. No edema. Distal pedal pulses in tact. L brachiocephalic AV fistula with palpable thrill. Various tattoos. Neuro: Alert and oriented X 3. No facial asymmetry. No focal deficit. Moves all extremities spontaneously. Psych: Responds to questions appropriately with a normal affect.   Intake/Output Summary (Last 24 hours) at 11/22/14 1047 Last data filed at 11/21/14 2346  Gross per 24 hour  Intake    924 ml  Output      0 ml  Net    924 ml    Inpatient Medications:  . amitriptyline  100 mg Oral QHS  . amLODipine  10 mg Oral Daily  . aspirin EC  81 mg Oral Daily  . atorvastatin  40 mg Oral Daily  . calcium acetate  1,334 mg Oral TID WC  . carvedilol  50 mg Oral BID WC  . cinacalcet  60 mg Oral QHS  . clopidogrel  75 mg Oral Daily  . [START ON 11/23/2014] doxercalciferol  2 mcg Intravenous Q T,Th,Sa-HD  . [START ON 11/26/2014] ferric gluconate (FERRLECIT/NULECIT) IV  62.5 mg Intravenous Weekly  . heparin  5,000 Units Subcutaneous 3 times per day  . insulin aspart  0-9 Units Subcutaneous TID  WC  . pantoprazole  40 mg Oral Daily  . pregabalin  100 mg Oral TID  . sevelamer carbonate  1,600 mg Oral Q1200  . sevelamer carbonate  2,400 mg Oral BID WC  . venlafaxine  37.5 mg Oral BID   Infusions:    Labs:  Recent Labs  11/20/14 2245 11/22/14 0519  NA 138 138  K 4.5 4.7  CL 95* 93*  CO2 30 31  GLUCOSE 81 86  BUN 24* 40*  CREATININE 6.70* 9.71*  CALCIUM 9.4 9.1  PHOS  --  9.8*    Recent Labs  11/22/14 0519  ALBUMIN 3.4*    Recent Labs  11/20/14 2245 11/22/14 0519  WBC 6.4 5.2  HGB 13.1 11.8*  HCT 41.0 37.1*  MCV 84.7 83.0  PLT 193 174    Recent Labs  11/21/14 0355 11/21/14 0635 11/21/14 1137  TROPONINI <0.03 <0.03 <0.03   Invalid input(s): POCBNP No results for input(s): HGBA1C in the last 72 hours.   Radiology/Studies:  Dg Chest 2 View  11/20/2014   CLINICAL DATA:  Chest pain today.  Initial encounter.  EXAM: CHEST  2 VIEW  COMPARISON:  11/09/2014  FINDINGS: There is mild stable left hemidiaphragm elevation. Pulmonary vasculature has essentially normalized since the study of 11/09/2014. Lungs are clear. There are no pleural effusions. Hilar, mediastinal and cardiac contours are unremarkable.  IMPRESSION: No active cardiopulmonary disease.   Electronically Signed   By: Andreas Newport M.D.   On: 11/20/2014 23:01   Dg Chest  Port 1 View  11/09/2014   CLINICAL DATA:  Chest pain and difficulty breathing  EXAM: PORTABLE CHEST - 1 VIEW  COMPARISON:  April 26, 2014  FINDINGS: There is stable elevation of the left hemidiaphragm. The interstitium is prominent, probably representing chronic interstitial edema. Heart is mildly enlarged, and there is slight pulmonary venous hypertension. No adenopathy. No airspace consolidation. No bone lesions.  IMPRESSION: Findings felt to be consistent with a degree of chronic congestive heart failure. No appreciable change from prior study. No airspace consolidation.   Electronically Signed   By: Lowella Grip M.D.   On:  11/09/2014 07:02     Assessment and Plan  1. Recurrent chest pain 2. CAD s/p PCI as above 3. Essential HTN with elevated BP 4. ESRD on HD 5. ? Unclear history of stroke - CT negative by [facility]where 6. Unintentional weight loss 7. Diabetes mellitus 8. LDL of 57  Per notes this AM there are plans for adjustment of BP meds per IM/renal for hypotension. With BP issues will hold off on titration of anti-anginals. Plan cath tomorrow. Risks and benefits of cardiac catheterization have been discussed with the patient.  These include bleeding, infection, stroke, heart attack, death. Risk of kidney damage mitigated by HD. The patient understands these risks and is willing to proceed. Scheduled for tomorrow at 7:30AM.  Per Dr. Johnsie Cancel started back on Plavix yesterday without loading. If PCI necessary may need to consider additional dosing.  Signed, Melina Copa PA-C    The patient was seen, examined and discussed with Melina Copa, PA-C and I agree with the above.   59 year old male with known CAD (s/p BMS to mid LAD 12/2009 and DES to mid LAD 10/2010 ), ESRD on HD admitted with unstable angina, cath scheduled for tomorrow. Currently CP free, mild CP earlier today.  [name] [name] 11/22/2014

## 2014-11-22 NOTE — Plan of Care (Signed)
Problem: Phase I Progression Outcomes Goal: Hemodynamically stable Progressing  Goal: Anginal pain relieved Progressing, pt has not complained of chest pain during shift, was sleeping soundly at beginning of shift and pain was releived by his report by administration of GI cocktail Goal: MD aware of Cardiac Marker results Progressing  Goal: Voiding-avoid urinary catheter unless indicated Pt is on chronic dialysis voids little to none per his report

## 2014-11-22 NOTE — Progress Notes (Signed)
[facility] Rounding Note: Subjective:  Not having any chest pain today on HD and none last PM In dialysis Denies SOB Pre HD weight 91.1  (dry 90, left at 89 after last outpt TMT)  Objective Vital signs in last 24 hours: Filed Vitals:   11/21/14 0742 11/21/14 1435 11/21/14 2052 11/22/14 0437  BP: 169/90 185/95 164/86 140/76  Pulse: 92 82 84 74  Temp: 97.5 F (36.4 C) 98.1 F (36.7 C) 98.2 F (36.8 C) 97.5 F (36.4 C)  TempSrc: Oral Oral Oral Oral  Resp:  18 18 18   Weight:      SpO2: 100% 98% 98% 98%   Weight change:   Intake/Output Summary (Last 24 hours) at 11/22/14 0728 Last data filed at 11/21/14 2346  Gross per 24 hour  Intake   1368 ml  Output      0 ml  Net   1368 ml   Physical Exam:  Blood pressure 140/76, pulse 74, temperature 97.5 F (36.4 C), temperature source Oral, resp. rate 18, weight 95.7 kg (210 lb 15.7 oz), SpO2 98 %. General appearance: alert, cooperative and no distress Head: [place]/AT Neck: No JVD  Resp: clear to auscultation bilaterally Cardio: regular rate and rhythm, S1, S2 normal, no murmur, click, rub or gallop GI: soft, non-tender; bowel sounds normal; no masses, no organomegaly Extremities: extremities normal, atraumatic, no cyanosis or edema Neurologic: Grossly normal Dialysis Access: AVF @ LUA with + bruit - aneurysmal - currently cannulated for dialysis  Labs:  Recent Labs Lab 11/20/14 2245 11/22/14 0519  NA 138 138  K 4.5 4.7  CL 95* 93*  CO2 30 31  GLUCOSE 81 86  BUN 24* 40*  CREATININE 6.70* 9.71*  CALCIUM 9.4 9.1  PHOS  --  9.8*     Recent Labs Lab 11/22/14 0519  ALBUMIN 3.4*    Recent Labs Lab 11/20/14 2245 11/22/14 0519  WBC 6.4 5.2  HGB 13.1 11.8*  HCT 41.0 37.1*  MCV 84.7 83.0  PLT 193 174     Recent Labs Lab 11/21/14 0355 11/21/14 0635 11/21/14 1137  TROPONINI <0.03 <0.03 <0.03   CBG:  Recent Labs Lab 11/21/14 0636 11/21/14 1120 11/21/14 1626 11/21/14 2117 11/22/14 0641   GLUCAP 89 130* 101* 120* 77   Studies/Results: Dg Chest 2 View  11/20/2014   CLINICAL DATA:  Chest pain today.  Initial encounter.  EXAM: CHEST  2 VIEW  COMPARISON:  11/09/2014  FINDINGS: There is mild stable left hemidiaphragm elevation. Pulmonary vasculature has essentially normalized since the study of 11/09/2014. Lungs are clear. There are no pleural effusions. Hilar, mediastinal and cardiac contours are unremarkable.  IMPRESSION: No active cardiopulmonary disease.   Electronically Signed   By: Andreas Newport M.D.   On: 11/20/2014 23:01   Medications:   . amitriptyline  100 mg Oral QHS  . amLODipine  10 mg Oral Daily  . aspirin EC  81 mg Oral Daily  . atorvastatin  40 mg Oral Daily  . calcium acetate  1,334 mg Oral TID WC  . carvedilol  50 mg Oral BID WC  . cinacalcet  60 mg Oral QHS  . clopidogrel  75 mg Oral Daily  . [START ON 11/23/2014] doxercalciferol  2 mcg Intravenous Q T,Th,Sa-HD  . [START ON 11/26/2014] ferric gluconate (FERRLECIT/NULECIT) IV  62.5 mg Intravenous Weekly  . heparin  5,000 Units Subcutaneous 3 times per day  . insulin aspart  0-9 Units Subcutaneous TID WC  . pantoprazole  40 mg  Oral Daily  . pregabalin  100 mg Oral TID  . sevelamer carbonate  1,600 mg Oral Q1200  . sevelamer carbonate  2,400 mg Oral BID WC  . venlafaxine  37.5 mg Oral BID   Dialysis Orders: TTS @ AF 4 hrs 90 kg Will be lower at discharge 2K/2.25Ca 500/800 Heparin 8000 U AVF @ LUA Hectorol 2 mcg Aranesp 40 mcg on Tues Venofer 50 mg on Fri  Assessment/Plan: 1. Chest pain / Dyspnea -EKG stable, troponins negative; Hx CAD s/p stents x 2 @ [facility]. For heart cath onTuesday 2. ESRD - HD on TTS @ AF. HD today off schedule to facilitate cath tomorrow. Will get back on schedule prior to discharge 3. Hypertension/volume - Was below EDW post last outpt tmt at 89kg and will try for additional volume today.  4. Anemia - 13.1->11.8. Usual Aranesp 40 mcg on Tues's, Fe  on Fri's. Holding Aranesp for now. 5. Metabolic bone disease - Last iPTH 247; Hectorol 2 mcg, Sensipar 60 mg qd, Renvela & Phoslo with meals. Phos 9.8. Will see how phos does with "forced compliance" as inpatient 6. Nutrition - renal carb-mod diet, vitamin.  7. DM Type 2 - insulin per primary 8. COPD - on inhaler   Jamal Maes, MD [facility] [phone] pager 11/22/2014, 7:28 AM

## 2014-11-23 ENCOUNTER — Encounter (HOSPITAL_COMMUNITY): Payer: Self-pay | Admitting: Cardiovascular Disease

## 2014-11-23 ENCOUNTER — Encounter (HOSPITAL_COMMUNITY)
Admission: EM | Disposition: A | Discharge status: Home / Self Care | Payer: Self-pay | Source: Home / Self Care | Attending: Internal Medicine

## 2014-11-23 DIAGNOSIS — R079 Chest pain, unspecified: Secondary | ICD-10-CM

## 2014-11-23 HISTORY — PX: LEFT HEART CATHETERIZATION WITH CORONARY ANGIOGRAM: SHX5451

## 2014-11-23 LAB — IRON AND TIBC
Iron: 62 ug/dL (ref 42–165)
Saturation Ratios: 34 % (ref 20–55)
TIBC: 180 ug/dL — AB (ref 215–435)
UIBC: 118 ug/dL — AB (ref 125–400)

## 2014-11-23 LAB — GLUCOSE, CAPILLARY
GLUCOSE-CAPILLARY: 98 mg/dL (ref 70–99)
GLUCOSE-CAPILLARY: 129 mg/dL — AB (ref 70–99)
Glucose-Capillary: 84 mg/dL (ref 70–99)
Glucose-Capillary: 125 mg/dL — ABNORMAL HIGH (ref 70–99)
Glucose-Capillary: 101 mg/dL — ABNORMAL HIGH (ref 70–99)

## 2014-11-23 LAB — PROTIME-INR
INR: 1.05 (ref 0.00–1.49)
PROTHROMBIN TIME: 13.8 s (ref 11.6–15.2)

## 2014-11-23 LAB — RENAL FUNCTION PANEL
ALBUMIN: 3.5 g/dL (ref 3.5–5.2)
Anion gap: 13 (ref 5–15)
BUN: 34 mg/dL — ABNORMAL HIGH (ref 6–23)
CHLORIDE: 98 mmol/L (ref 96–112)
CO2: 26 mmol/L (ref 19–32)
CREATININE: 7.55 mg/dL — AB (ref 0.50–1.35)
Calcium: 8.4 mg/dL (ref 8.4–10.5)
GFR calc Af Amer: 8 mL/min — ABNORMAL LOW (ref >90)
GFR calc non Af Amer: 7 mL/min — ABNORMAL LOW (ref >90)
Glucose, Bld: 84 mg/dL (ref 70–99)
PHOSPHORUS: 7.6 mg/dL — AB (ref 2.3–4.6)
Potassium: 5.2 mmol/L — ABNORMAL HIGH (ref 3.5–5.1)
Sodium: 137 mmol/L (ref 135–145)

## 2014-11-23 LAB — CBC
HCT: 35 % — ABNORMAL LOW (ref 39.0–52.0)
HEMOGLOBIN: 11.1 g/dL — AB (ref 13.0–17.0)
MCH: 26.5 pg (ref 26.0–34.0)
MCHC: 31.7 g/dL (ref 30.0–36.0)
MCV: 83.5 fL (ref 78.0–100.0)
Platelets: 166 10*3/uL (ref 150–400)
RBC: 4.19 MIL/uL — ABNORMAL LOW (ref 4.22–5.81)
RDW: 16.3 % — AB (ref 11.5–15.5)
WBC: 5.9 10*3/uL (ref 4.0–10.5)

## 2014-11-23 LAB — HEPATITIS PANEL, ACUTE
HCV Ab: NEGATIVE
HEP A IGM: NONREACTIVE
HEP B C IGM: NONREACTIVE
Hepatitis B Surface Ag: NEGATIVE

## 2014-11-23 LAB — RETICULOCYTES
RBC.: 4.19 MIL/uL — AB (ref 4.22–5.81)
Retic Count, Absolute: 88 10*3/uL (ref 19.0–186.0)
Retic Ct Pct: 2.1 % (ref 0.4–3.1)

## 2014-11-23 LAB — FERRITIN: FERRITIN: 1129 ng/mL — AB (ref 22–322)

## 2014-11-23 LAB — FOLATE: Folate: 6.5 ng/mL

## 2014-11-23 LAB — VITAMIN B12: VITAMIN B 12: 290 pg/mL (ref 211–911)

## 2014-11-23 SURGERY — LEFT HEART CATHETERIZATION WITH CORONARY ANGIOGRAM
Anesthesia: LOCAL

## 2014-11-23 MED ORDER — SODIUM CHLORIDE 0.9 % IV SOLN: 250.0000 mL | INTRAVENOUS | Status: DC | PRN

## 2014-11-23 MED ORDER — ASPIRIN EC 81 MG PO TBEC: 81.0000 mg | DELAYED_RELEASE_TABLET | Freq: Every day | ORAL | Status: DC

## 2014-11-23 MED ORDER — HEPARIN SODIUM (PORCINE) 5000 UNIT/ML IJ SOLN
5000.0000 [IU] | Freq: Three times a day (TID) | INTRAMUSCULAR | Status: DC
Start: 2014-11-24 — End: 2014-11-23

## 2014-11-23 MED ORDER — ACETAMINOPHEN 325 MG PO TABS: 650.0000 mg | ORAL_TABLET | ORAL | Status: DC | PRN

## 2014-11-23 MED ORDER — HEPARIN (PORCINE) IN NACL 2-0.9 UNIT/ML-% IJ SOLN
INTRAMUSCULAR | Status: AC
  Filled 2014-11-23: qty 1500

## 2014-11-23 MED ORDER — FENTANYL CITRATE 0.05 MG/ML IJ SOLN
INTRAMUSCULAR | Status: AC
  Filled 2014-11-23: qty 2

## 2014-11-23 MED ORDER — NEPRO/CARBSTEADY PO LIQD
237.0000 mL | Freq: Two times a day (BID) | ORAL | Status: DC
  Administered 2014-11-24: 237 mL via ORAL
  Filled 2014-11-23 (×5): qty 237

## 2014-11-23 MED ORDER — HEPARIN SODIUM (PORCINE) 5000 UNIT/ML IJ SOLN
5000.0000 [IU] | Freq: Three times a day (TID) | INTRAMUSCULAR | Status: DC
  Administered 2014-11-24: 5000 [IU] via SUBCUTANEOUS
  Filled 2014-11-23 (×3): qty 1

## 2014-11-23 MED ORDER — DOXERCALCIFEROL 4 MCG/2ML IV SOLN
INTRAVENOUS | Status: AC
  Administered 2014-11-23: 2 ug via INTRAVENOUS
  Filled 2014-11-23: qty 2

## 2014-11-23 MED ORDER — SODIUM CHLORIDE 0.9 % IV SOLN: INTRAVENOUS | Status: DC

## 2014-11-23 MED ORDER — MIDAZOLAM HCL 2 MG/2ML IJ SOLN
INTRAMUSCULAR | Status: AC
  Filled 2014-11-23: qty 2

## 2014-11-23 MED ORDER — NITROGLYCERIN 1 MG/10 ML FOR IR/CATH LAB
INTRA_ARTERIAL | Status: AC
  Filled 2014-11-23: qty 10

## 2014-11-23 MED ORDER — SODIUM CHLORIDE 0.9 % IJ SOLN: 3.0000 mL | Freq: Two times a day (BID) | INTRAMUSCULAR | Status: DC

## 2014-11-23 MED ORDER — ASPIRIN 81 MG PO CHEW
81.0000 mg | CHEWABLE_TABLET | ORAL | Status: AC
  Administered 2014-11-23: 81 mg via ORAL
  Filled 2014-11-23: qty 1

## 2014-11-23 MED ORDER — LIDOCAINE HCL (PF) 1 % IJ SOLN
INTRAMUSCULAR | Status: AC
  Filled 2014-11-23: qty 30

## 2014-11-23 MED ORDER — SODIUM CHLORIDE 0.9 % IJ SOLN: 3.0000 mL | INTRAMUSCULAR | Status: DC | PRN

## 2014-11-23 MED ORDER — ONDANSETRON HCL 4 MG/2ML IJ SOLN: 4.0000 mg | Freq: Four times a day (QID) | INTRAMUSCULAR | Status: DC | PRN

## 2014-11-23 NOTE — H&P (View-Only) (Signed)
Patient: [name] [name] / Admit Date: 11/20/2014 / Date of Encounter: 11/22/2014, 10:47 AM   Subjective: No complaints. Had slight CP earlier but resolved.   Objective: Telemetry: NSR one PVC Physical Exam: Blood pressure 98/76, pulse 71, temperature 97.4 F (36.3 C), temperature source Oral, resp. rate 10, weight 201 lb 1 oz (91.2 kg), SpO2 97 %. General: Well developed, well nourished M in no acute distress. Laying flat for dialysis. Head: Normocephalic, atraumatic, sclera non-icteric, no xanthomas, nares are without discharge. Neck: JVD not elevated. Lungs: Clear bilaterally to auscultation without wheezes, rales, or rhonchi. Breathing is unlabored. Heart: RRR with S1 S2. Continuous murmur likely due to fistula Abdomen: Soft, non-tender, non-distended with normoactive bowel sounds. No hepatomegaly. No rebound/guarding. No obvious abdominal masses. Extremities: No clubbing or cyanosis. No edema. Distal pedal pulses in tact. L brachiocephalic AV fistula with palpable thrill. Various tattoos. Neuro: Alert and oriented X 3. No facial asymmetry. No focal deficit. Moves all extremities spontaneously. Psych: Responds to questions appropriately with a normal affect.   Intake/Output Summary (Last 24 hours) at 11/22/14 1047 Last data filed at 11/21/14 2346  Gross per 24 hour  Intake    924 ml  Output      0 ml  Net    924 ml    Inpatient Medications:  . amitriptyline  100 mg Oral QHS  . amLODipine  10 mg Oral Daily  . aspirin EC  81 mg Oral Daily  . atorvastatin  40 mg Oral Daily  . calcium acetate  1,334 mg Oral TID WC  . carvedilol  50 mg Oral BID WC  . cinacalcet  60 mg Oral QHS  . clopidogrel  75 mg Oral Daily  . [START ON 11/23/2014] doxercalciferol  2 mcg Intravenous Q T,Th,Sa-HD  . [START ON 11/26/2014] ferric gluconate (FERRLECIT/NULECIT) IV  62.5 mg Intravenous Weekly  . heparin  5,000 Units Subcutaneous 3 times per day  . insulin aspart  0-9 Units Subcutaneous TID  WC  . pantoprazole  40 mg Oral Daily  . pregabalin  100 mg Oral TID  . sevelamer carbonate  1,600 mg Oral Q1200  . sevelamer carbonate  2,400 mg Oral BID WC  . venlafaxine  37.5 mg Oral BID   Infusions:    Labs:  Recent Labs  11/20/14 2245 11/22/14 0519  NA 138 138  K 4.5 4.7  CL 95* 93*  CO2 30 31  GLUCOSE 81 86  BUN 24* 40*  CREATININE 6.70* 9.71*  CALCIUM 9.4 9.1  PHOS  --  9.8*    Recent Labs  11/22/14 0519  ALBUMIN 3.4*    Recent Labs  11/20/14 2245 11/22/14 0519  WBC 6.4 5.2  HGB 13.1 11.8*  HCT 41.0 37.1*  MCV 84.7 83.0  PLT 193 174    Recent Labs  11/21/14 0355 11/21/14 0635 11/21/14 1137  TROPONINI <0.03 <0.03 <0.03   Invalid input(s): POCBNP No results for input(s): HGBA1C in the last 72 hours.   Radiology/Studies:  Dg Chest 2 View  11/20/2014   CLINICAL DATA:  Chest pain today.  Initial encounter.  EXAM: CHEST  2 VIEW  COMPARISON:  11/09/2014  FINDINGS: There is mild stable left hemidiaphragm elevation. Pulmonary vasculature has essentially normalized since the study of 11/09/2014. Lungs are clear. There are no pleural effusions. Hilar, mediastinal and cardiac contours are unremarkable.  IMPRESSION: No active cardiopulmonary disease.   Electronically Signed   By: Andreas Newport M.D.   On: 11/20/2014 23:01   Dg Chest  Port 1 View  11/09/2014   CLINICAL DATA:  Chest pain and difficulty breathing  EXAM: PORTABLE CHEST - 1 VIEW  COMPARISON:  April 26, 2014  FINDINGS: There is stable elevation of the left hemidiaphragm. The interstitium is prominent, probably representing chronic interstitial edema. Heart is mildly enlarged, and there is slight pulmonary venous hypertension. No adenopathy. No airspace consolidation. No bone lesions.  IMPRESSION: Findings felt to be consistent with a degree of chronic congestive heart failure. No appreciable change from prior study. No airspace consolidation.   Electronically Signed   By: Lowella Grip M.D.   On:  11/09/2014 07:02     Assessment and Plan  1. Recurrent chest pain 2. CAD s/p PCI as above 3. Essential HTN with elevated BP 4. ESRD on HD 5. ? Unclear history of stroke - CT negative by [facility]where 6. Unintentional weight loss 7. Diabetes mellitus 8. LDL of 57  Per notes this AM there are plans for adjustment of BP meds per IM/renal for hypotension. With BP issues will hold off on titration of anti-anginals. Plan cath tomorrow. Risks and benefits of cardiac catheterization have been discussed with the patient.  These include bleeding, infection, stroke, heart attack, death. Risk of kidney damage mitigated by HD. The patient understands these risks and is willing to proceed. Scheduled for tomorrow at 7:30AM.  Per Dr. Johnsie Cancel started back on Plavix yesterday without loading. If PCI necessary may need to consider additional dosing.  Signed, Melina Copa PA-C    The patient was seen, examined and discussed with Melina Copa, PA-C and I agree with the above.   59 year old male with known CAD (s/p BMS to mid LAD 12/2009 and DES to mid LAD 10/2010 ), ESRD on HD admitted with unstable angina, cath scheduled for tomorrow. Currently CP free, mild CP earlier today.  [name] [name] 11/22/2014

## 2014-11-23 NOTE — Interval H&P Note (Signed)
Cath Lab Visit (complete for each Cath Lab visit)  Clinical Evaluation Leading to the Procedure:   ACS: No.  Non-ACS:    Anginal Classification: CCS IV  Anti-ischemic medical therapy: Maximal Therapy (2 or more classes of medications)  Non-Invasive Test Results: No non-invasive testing performed  Prior CABG: No previous CABG      History and Physical Interval Note:  11/23/2014 7:59 AM  [name] [name]  has presented today for surgery, with the diagnosis of unstable angina  The various methods of treatment have been discussed with the patient and family. After consideration of risks, benefits and other options for treatment, the patient has consented to  Procedure(s): LEFT HEART CATHETERIZATION WITH CORONARY ANGIOGRAM (N/A) as a surgical intervention .  The patient's history has been reviewed, patient examined, no change in status, stable for surgery.  I have reviewed the patient's chart and labs.  Questions were answered to the patient's satisfaction.     [name]

## 2014-11-23 NOTE — Progress Notes (Signed)
Medicare Important Message given? YES  (If response is "NO", the following Medicare IM given date fields will be blank)  Date Medicare IM given: 11/23/14 Medicare IM given by:  Dahlia [employer]

## 2014-11-23 NOTE — Progress Notes (Signed)
Subjective: Pt is s/p cath with normal LV fx, no significant coronary obstruction w/ widely patent LAD stent in proximal to mid segment with minimal 20% smooth in-stent narrowing and mild 20% ostial narrowing of the fist diagonal branch w/in the stented segment. Mild chest pain after cath that has resolved. Cars to start Ranexa today. Pt in HD now and is doing well, denies chest pain, abd pain, or SOB.   Objective: Vital signs in last 24 hours: Filed Vitals:   11/23/14 1404 11/23/14 1435 11/23/14 1454 11/23/14 1527  BP: 85/56 98/61 104/61 100/62  Pulse: 79 73 72 78  Temp:      TempSrc:      Resp: 24 10 10 15   Height:      Weight:      SpO2:       Weight change: -3 lb 4.9 oz (-1.5 kg)  Intake/Output Summary (Last 24 hours) at 11/23/14 1546 Last data filed at 11/23/14 1028  Gross per 24 hour  Intake   1080 ml  Output      0 ml  Net   1080 ml    Physical Exam General: In HD, resting comfortably, NAD CV: RRR, continuous murmur present Pulm: CTA bilaterally, breaths non-labored Abd: BS+, soft, non-tender Ext: Warm, no edema, moves all. Left brachiocephalic AVF with palpable thrill. Multiple tattoos.  Neuro: Alert and oriented x 3, no focal deficits   Lab Results: Basic Metabolic Panel:  Recent Labs Lab 11/22/14 0519 11/23/14 0426  NA 138 137  K 4.7 5.2*  CL 93* 98  CO2 31 26  GLUCOSE 86 84  BUN 40* 34*  CREATININE 9.71* 7.55*  CALCIUM 9.1 8.4  PHOS 9.8* 7.6*   Liver Function Tests:  Recent Labs Lab 11/22/14 0519 11/23/14 0426  ALBUMIN 3.4* 3.5   CBC:  Recent Labs Lab 11/22/14 0519 11/23/14 0426  WBC 5.2 5.9  HGB 11.8* 11.1*  HCT 37.1* 35.0*  MCV 83.0 83.5  PLT 174 166   Cardiac Enzymes:  Recent Labs Lab 11/21/14 0355 11/21/14 0635 11/21/14 1137  TROPONINI <0.03 <0.03 <0.03   CBG:  Recent Labs Lab 11/22/14 1149 11/22/14 1653 11/22/14 2050 11/23/14 0645 11/23/14 0843 11/23/14 1113  GLUCAP 123* 97 132* 84 98 125*   Urine Drug  Screen: Drugs of Abuse     Component Value Date/Time   LABOPIA NONE DETECTED 11/09/2014 0916   COCAINSCRNUR NONE DETECTED 11/09/2014 0916   LABBENZ NONE DETECTED 11/09/2014 0916   AMPHETMU NONE DETECTED 11/09/2014 0916   THCU NONE DETECTED 11/09/2014 0916   LABBARB NONE DETECTED 11/09/2014 0916     Studies/Results: No results found.   Medications: I have reviewed the patient's current medications. Scheduled Meds: . amitriptyline  100 mg Oral QHS  . amLODipine  5 mg Oral Daily  . aspirin EC  81 mg Oral Daily  . atorvastatin  40 mg Oral Daily  . calcium acetate  1,334 mg Oral TID WC  . carvedilol  50 mg Oral BID WC  . cinacalcet  60 mg Oral QHS  . clopidogrel  75 mg Oral Daily  . doxercalciferol  2 mcg Intravenous Q T,Th,Sa-HD  . [START ON 11/26/2014] ferric gluconate (FERRLECIT/NULECIT) IV  62.5 mg Intravenous Weekly  . [START ON 11/24/2014] heparin  5,000 Units Subcutaneous 3 times per day  . insulin aspart  0-9 Units Subcutaneous TID WC  . pantoprazole  40 mg Oral Daily  . pregabalin  100 mg Oral TID  . sevelamer carbonate  1,600  mg Oral Q1200  . sevelamer carbonate  2,400 mg Oral BID WC  . venlafaxine  37.5 mg Oral BID   Continuous Infusions: . sodium chloride     PRN Meds:.acetaminophen, gi cocktail, ipratropium-albuterol, morphine injection, nitroGLYCERIN, ondansetron (ZOFRAN) IV   Assessment/Plan: 59yo M w/ PMH CAD, HTN, and ESRD on HD 59yo M w/ PMH CAD, HTN, and ESRD on HD was admitted with recurrent chest pain.  #Recurrent Chest Pain: S/p cath today with normal EF and no obstructive lesions visualized. Currently chest pain free, w/o EKG changes, troponins all negative.  Etiology unclear, concerning for unstable angina in setting of CAD s/p PCI x2.  Cardiolite study normal on 09/04/14 and w/o evidence for infarction or ischemia. Cards to start Ranexa, antianginal medication, today and will have the pt f/u as an outpatient. - Appreciate Cardiology recommendations  - Start Ranexa 500mg  BID - Continue Sl NTG  PRN -Continue aspirin 81 mg daily, amlodipine 10 mg daily, coreg 50 mg BID, and lipitor 40 mg daily -Continue trial of protonix 40 mg daily for GERD -P2Y12 level normal, pt started on plavix 75 mg daily without loading dose  #ESRD on HD TThSa: Tolerating HD but with soft BPs, in HD now and BPs stable. Pt with secondary hyperparathyroidism with elevated PTH level of 247.  -Appreciate nephrology recommendations  -Continue home Renvela 1600-2400 mg TID  -Continue Phoslo 1,334 mg TID -Continue Sensipar 60 mg QHS -Continue hectoral 2 mcg with HD   #HTN: Low of 62/44 with HD. Low normal now while in HD. Amlodipine decreased to 5mg  from 10mg  daily. - Continue amlodipine 5 mg daily  - Consider decreasing Coreg 50 mg BID to 25 mg BID or stopping CCB if BP still soft.  #CAD: Patient had stents placed to mid LAD in March 2011 (bare metal) and Jan 2012 (DES). He underwent a cardiac cath in Dec 2013 which showed 50% mid LAD stenosis and overall was unchanged from his prior study in 2012. He takes aspirin 81 mg daily, amlodipine 10 mg daily, coreg 50 mg BID, and lipitor 40 mg daily at home. Last lipid panel on 11/09/14 with LDL at goal of 57. Cath today w/o obstructive lesions, normal EF.  - Continue ASA 81 mg daily - Decreased Amlodipine 5 mg daily - Continue Coreg 50 mg BID - Continue Lipitor 40 mg daily  - P2Y12 level normal, pt started on plavix 75 mg daily without loading dose - Starting Ranexa per Cards  #Anemia of Chronic Disease: Hg 11.1 down from 13.1 on admission, but near baseline 11 with no active bleeding or hemodynamic instability. Anemia panel with elevated ferritin and low TIBC with normal iron levels.  Recent Labs Lab 11/20/14 2245 11/22/14 0519 11/23/14 0426  HGB 13.1 11.8* 11.1*   -Holding Aranesp 40 mcg Tue and IV ferric gluconate on Fridays -Obtain anemia panel -Monitor CBC -Monitor for bleeding  #Non-InsulinType 2 DM: Last CBG 123.  Last HbA1c 5.5 on 11/09/14. Continue  Lyrica for neuropathic pain.  - SSI - Continue Lyrica 100 mg TID  #Unintentional Weight Loss: Patient reports additional weight loss since last hospitalization, stating he is down to 205 lbs. He also reports night sweats. He denies any dark or bloody stool. No lymphadenopathy noted on exam. He had reported a 36 lb weight loss since July 2015 during his last hospitalization on 11/09/14. His weight appears stable since last hospitalization at 210 lbs. Per care everywhere, his weight was 260 lbs in April 2014 then dropped to 240 lbs in August 2015. By Nov 2015 his weight dropped  significantly to 217 lbs and then 208 lbs in Dec 2015. No mention of weight loss in prior notes. Patient was hospitalized for hematochezia at [facility] in October 2014. A colonoscopy was performed where two 36mm polyps were removed from the transverse and ascending colon and five 2-3 mm polyps removed from the sigmoid, descending, transverse, and ascending colon, but unable to find path report for these polyps. Internal hemorrhoids were determined to be the source of his bleeding. An EGD was also performed at that time which was normal. He had also been hospitalized for hematuria in July 2014 and CT urogram was normal. Patient does have a smoking history and warrants further malignancy work up as an outpatient given his weight loss. Potential sources include lung and prostate.  -Needs outpatient PCP follow-up and evaluation  #COPD: Stable. No acute exacerbation. - Continue duoneb 2 puffs Q6H PRN wheezing   #Depression: Stable mood. On home meds. - Continue Amitriptyline 100 mg QHS - Continue Effexor 37.5 mg BID  #Diet: Renal/Carb modified with 1200 ml fluid restriction  #DVT PPx: [place] Heparin  #Code: FULL   Dispo: Will likely d/c to home tomorrow.   The patient does not have a current PCP ([name]) and may need an [place] follow-up appointment after discharge.  The patient does not have transportation limitations  that hinder transportation to clinic appointments.  .Services Needed at time of discharge: Y = Yes, Blank = No PT:   OT:   RN:   Equipment:   Other:     LOS: 2 days   Otho Bellows, MD 11/23/2014, 3:46 PM

## 2014-11-23 NOTE — Procedures (Signed)
I have personally attended this patient's dialysis session.   3 hours treatment today to get back on schedule. 2K bath (K 5.2) BFR 400 L AVF Goal weight 89 kg (larger goal  4.7 kg than I would have expected given HD yesterday)  Jamal Maes, MD [place] Pager 11/23/2014, 1:42 PM

## 2014-11-23 NOTE — Progress Notes (Signed)
  Date: 11/23/2014  Patient name: [name] [name]  Medical record number: [id number]  Date of birth: [birth year]   Mr Weishaupt was off the floor during AM rounds and also in afternoon for procedures. This patient's plan of care was discussed with the house staff. Please see their note for complete details. I concur with their findings. Cath showed nl EF and no obstructive lesions. Cards rec ranexa and has signed off. Pt now in HD. Likely D/C in AM unless everything can be arranged for tonight after HD. Weight loss W/U as outpt.   Bartholomew Crews, MD 11/23/2014, 2:29 PM

## 2014-11-23 NOTE — Progress Notes (Signed)
Site area: RFA Site Prior to Removal:  Level 0 Pressure Applied For:25 min Manual:yes    Patient Status During Pull:  stable Post Pull Site:  Level 0 Post Pull Instructions Given:  yes Post Pull Pulses Present: palpable Dressing Applied:   Bedrest begins @ [id number] Comments:

## 2014-11-23 NOTE — Progress Notes (Signed)
Patient: [name] [name] / Admit Date: 11/20/2014 / Date of Encounter: 11/23/2014, 1:30 PM   Subjective: No complaints. Had slight CP earlier but resolved.   Objective: Telemetry: NSR one PVC Physical Exam: Blood pressure 134/80, pulse 74, temperature 97.6 F (36.4 C), temperature source Oral, resp. rate 12, height 6' (1.829 m), weight 206 lb 12.7 oz (93.8 kg), SpO2 93 %. General: Well developed, well nourished M in no acute distress. Laying flat for dialysis. Head: Normocephalic, atraumatic, sclera non-icteric, no xanthomas, nares are without discharge. Neck: JVD not elevated. Lungs: Clear bilaterally to auscultation without wheezes, rales, or rhonchi. Breathing is unlabored. Heart: RRR with S1 S2. Continuous murmur likely due to fistula Abdomen: Soft, non-tender, non-distended with normoactive bowel sounds. No hepatomegaly. No rebound/guarding. No obvious abdominal masses. Extremities: No clubbing or cyanosis. No edema. Distal pedal pulses in tact. L brachiocephalic AV fistula with palpable thrill. Various tattoos. Neuro: Alert and oriented X 3. No facial asymmetry. No focal deficit. Moves all extremities spontaneously. Psych: Responds to questions appropriately with a normal affect.   Intake/Output Summary (Last 24 hours) at 11/23/14 1330 Last data filed at 11/23/14 1028  Gross per 24 hour  Intake   1080 ml  Output      0 ml  Net   1080 ml    Inpatient Medications:  . amitriptyline  100 mg Oral QHS  . amLODipine  5 mg Oral Daily  . aspirin EC  81 mg Oral Daily  . atorvastatin  40 mg Oral Daily  . calcium acetate  1,334 mg Oral TID WC  . carvedilol  50 mg Oral BID WC  . cinacalcet  60 mg Oral QHS  . clopidogrel  75 mg Oral Daily  . doxercalciferol  2 mcg Intravenous Q T,Th,Sa-HD  . [START ON 11/26/2014] ferric gluconate (FERRLECIT/NULECIT) IV  62.5 mg Intravenous Weekly  . [START ON 11/24/2014] heparin  5,000 Units Subcutaneous 3 times per day  . insulin aspart  0-9  Units Subcutaneous TID WC  . pantoprazole  40 mg Oral Daily  . pregabalin  100 mg Oral TID  . sevelamer carbonate  1,600 mg Oral Q1200  . sevelamer carbonate  2,400 mg Oral BID WC  . venlafaxine  37.5 mg Oral BID   Infusions:  . sodium chloride      Labs:  Recent Labs  11/22/14 0519 11/23/14 0426  NA 138 137  K 4.7 5.2*  CL 93* 98  CO2 31 26  GLUCOSE 86 84  BUN 40* 34*  CREATININE 9.71* 7.55*  CALCIUM 9.1 8.4  PHOS 9.8* 7.6*    Recent Labs  11/22/14 0519 11/23/14 0426  ALBUMIN 3.4* 3.5    Recent Labs  11/22/14 0519 11/23/14 0426  WBC 5.2 5.9  HGB 11.8* 11.1*  HCT 37.1* 35.0*  MCV 83.0 83.5  PLT 174 166    Recent Labs  11/21/14 0355 11/21/14 0635 11/21/14 1137  TROPONINI <0.03 <0.03 <0.03   Invalid input(s): POCBNP No results for input(s): HGBA1C in the last 72 hours.   Radiology/Studies:  Dg Chest 2 View  11/20/2014   CLINICAL DATA:  Chest pain today.  Initial encounter.  EXAM: CHEST  2 VIEW  COMPARISON:  11/09/2014  FINDINGS: There is mild stable left hemidiaphragm elevation. Pulmonary vasculature has essentially normalized since the study of 11/09/2014. Lungs are clear. There are no pleural effusions. Hilar, mediastinal and cardiac contours are unremarkable.  IMPRESSION: No active cardiopulmonary disease.   Electronically Signed   By: Valerie Roys.D.  On: 11/20/2014 23:01   Dg Chest Port 1 View  11/09/2014   CLINICAL DATA:  Chest pain and difficulty breathing  EXAM: PORTABLE CHEST - 1 VIEW  COMPARISON:  April 26, 2014  FINDINGS: There is stable elevation of the left hemidiaphragm. The interstitium is prominent, probably representing chronic interstitial edema. Heart is mildly enlarged, and there is slight pulmonary venous hypertension. No adenopathy. No airspace consolidation. No bone lesions.  IMPRESSION: Findings felt to be consistent with a degree of chronic congestive heart failure. No appreciable change from prior study. No airspace  consolidation.   Electronically Signed   By: Lowella Grip M.D.   On: 11/09/2014 07:02     Assessment and Plan  1. Recurrent chest pain 2. CAD s/p PCI as above 3. Essential HTN with elevated BP 4. ESRD on HD 5. ? Unclear history of stroke - CT negative by [facility] 6. Unintentional weight loss 7. Diabetes mellitus 8. LDL of 38  59 year old male with known CAD (s/p BMS to mid LAD 12/2009 and DES to mid LAD 10/2010 ), ESRD on HD admitted with unstable angina, cath showed normal LV function, No significant coronary obstructive disease with widely patent LAD stent in the proximal to midsegment with minimal 20% smooth in-stent narrowing and mild 20% ostial narrowing of the first diagonal branch within the stented segment.  The patient is in HD now. We would recommend to start ranexa 500 mg po BID. The patient can be discharged, we will arrange for an outpatient follow up.   Dorothy Spark 11/23/2014

## 2014-11-23 NOTE — Progress Notes (Signed)
INITIAL NUTRITION ASSESSMENT  DOCUMENTATION CODES Per approved criteria  -Not Applicable   INTERVENTION: Provide Nepro Shake po BID, each supplement provides 425 kcal and 19 grams protein.  Provide nourishment snack (turkey/chicken salad sandwich). Ordered.  Encourage adequate PO intake.  NUTRITION DIAGNOSIS: Increased nutrient needs related to chronic illness, ESRD as evidenced by estimated nutrition needs.   Goal: Pt to meet >/= 90% of their estimated nutrition needs   Monitor:  PO intake, weight trends, labs, I/O's  Reason for Assessment: MST  59 y.o. male  Admitting Dx: Chest pain  ASSESSMENT: Pt with hypertension, end-stage renal disease, type 2 diabetes, coronary arteries disease status post stent who presents to the ED with chest pain.  PROCEDURE:(2/16) Left heart catheterization: coronary angiography, left ventriculography.  Pt reports having a good appetite currently and PTA at home eating 3 meals a day. Current meal completion is 100%. Pt does reports unintentional weight loss with his usual body weight os 240 lbs. Noted with a 16% weight loss in 7 months. Pt reports weight loss is due to vomiting which has been been recurrent over the past several months. Pt reports it has been unpredictable and vomiting would occur without any symptoms or pattern. Pt is unsure of the problem to the vomiting. Pt however reports he is still eating 3 meals a day. Pt is agreeable to Nepro shake to aid in prevention of further weight loss. Pt also reports wanting additional snacks to aid in protein consumption. RD to order snacks. Pt was encouraged to eat his food at meals. Pt educated to continue oral supplementation at home.  Pt with no observed significant fat or muscle mass loss.   Labs: Low GFR. High potassium (5.2), phosphorous (7.6), BUN,, and creatinine.  Height: Ht Readings from Last 1 Encounters:  11/22/14 6' (1.829 m)    Weight: Wt Readings from Last 1 Encounters:   11/23/14 203 lb 4.8 oz (92.216 kg)    Ideal Body Weight: 178 lbs  % Ideal Body Weight: 114%  Wt Readings from Last 10 Encounters:  11/23/14 203 lb 4.8 oz (92.216 kg)  11/09/14 214 lb 15.2 oz (97.5 kg)  04/26/14 242 lb 3.2 oz (109.861 kg)    Usual Body Weight: 240 lbs  % Usual Body Weight: 85%  BMI:  Body mass index is 27.57 kg/(m^2).  Estimated Nutritional Needs: Kcal: 2250-2400 Protein: 115-125 grams Fluid: 1.2 L/day  Skin: intact  Diet Order: Diet renal/carb modified with 1200 ml fluid restriction  EDUCATION NEEDS: -Education needs addressed   Intake/Output Summary (Last 24 hours) at 11/23/14 0955 Last data filed at 11/22/14 2055  Gross per 24 hour  Intake    960 ml  Output   2134 ml  Net  -1174 ml    Last BM: 2/15  Labs:   Recent Labs Lab 11/20/14 2245 11/22/14 0519 11/23/14 0426  NA 138 138 137  K 4.5 4.7 5.2*  CL 95* 93* 98  CO2 30 31 26   BUN 24* 40* 34*  CREATININE 6.70* 9.71* 7.55*  CALCIUM 9.4 9.1 8.4  PHOS  --  9.8* 7.6*  GLUCOSE 81 86 84    CBG (last 3)   Recent Labs  11/22/14 2050 11/23/14 0645 11/23/14 0843  GLUCAP 132* 84 98    Scheduled Meds: . amitriptyline  100 mg Oral QHS  . amLODipine  5 mg Oral Daily  . aspirin EC  81 mg Oral Daily  . aspirin EC  81 mg Oral Daily  . atorvastatin  40  mg Oral Daily  . calcium acetate  1,334 mg Oral TID WC  . carvedilol  50 mg Oral BID WC  . cinacalcet  60 mg Oral QHS  . clopidogrel  75 mg Oral Daily  . doxercalciferol  2 mcg Intravenous Q T,Th,Sa-HD  . [START ON 11/26/2014] ferric gluconate (FERRLECIT/NULECIT) IV  62.5 mg Intravenous Weekly  . [START ON 11/24/2014] heparin  5,000 Units Subcutaneous 3 times per day  . [START ON 11/24/2014] heparin  5,000 Units Subcutaneous 3 times per day  . insulin aspart  0-9 Units Subcutaneous TID WC  . pantoprazole  40 mg Oral Daily  . pregabalin  100 mg Oral TID  . sevelamer carbonate  1,600 mg Oral Q1200  . sevelamer carbonate  2,400 mg Oral  BID WC  . venlafaxine  37.5 mg Oral BID    Continuous Infusions: . sodium chloride      Past Medical History  Diagnosis Date  . Hypertension   . ESRD on dialysis   . Diabetes mellitus with nephropathy   . Hemodialysis patient   . Hematochezia     a. 2014: colonscopy, which showed moderately-sized internal hemorrhoids, two 63mm polyps in transverse colon and ascending colon that were resected, five 2-31mm polyps in sigmoid colon, descending colon, transverse colon, and ascending colon that were resected. An upper endoscopy was performed and showed normal esophagus, stomach, and duodenum.  . Hematuria     a. H/o hematuria 2014 with cystoscopy that was unrevealing for his source of hematuria. He underwent a kidney ultrasound on 10/14 that showed mildly echogenic and scarred kidneys compatible with medical renal disease, without hydronephrosis or renal calculi.  [name] Anemia   . Depression   . CAD (coronary artery disease)     a. per [facility] s/p BMS to mid LAD 12/2009 and DES to mid LAD 10/2010.  . Colon polyps   . Chronic diastolic CHF (congestive heart failure)     History reviewed. No pertinent past surgical history.  Kallie Locks, MS, RD, LDN Pager # [phone] After hours/ weekend pager # [phone]

## 2014-11-23 NOTE — Progress Notes (Signed)
[facility] Rounding Note: Subjective:  Had HD yesterday in prep for cath today Cath results noted - for medical management  Objective Vital signs in last 24 hours: Filed Vitals:   11/23/14 0855 11/23/14 0900 11/23/14 0905 11/23/14 0910  BP: 126/69 125/68 125/72 122/81  Pulse: 73 72 71 70  Temp:      TempSrc:      Resp: 9 9 9 10   Height:      Weight:      SpO2: 95% 95% 95% 95%   Weight change: -1.5 kg (-3 lb 4.9 oz)  Intake/Output Summary (Last 24 hours) at 11/23/14 [id number] Last data filed at 11/22/14 2055  Gross per 24 hour  Intake    960 ml  Output   2134 ml  Net  -1174 ml   Physical Exam:  BP 122/81 mmHg  Pulse 70  Temp(Src) 98.7 F (37.1 C) (Oral)  Resp 10  Ht 6' (1.829 m)  Wt 92.216 kg (203 lb 4.8 oz)  BMI 27.57 kg/m2  SpO2 95% General appearance: alert, cooperative and no distress Head: [place]/AT Neck: No JVD  Resp: clear to auscultation bilaterally Cardio: regular rate and rhythm, S1, S2 normal, no murmur, click, rub or gallop GI: soft, non-tender; bowel sounds normal; no masses, no organomegaly Extremities: extremities normal, atraumatic, no cyanosis or edema. Right groin looks OK. Neurologic: Grossly normal Dialysis Access: aneurysmal AVF @ LUA   Labs:  Recent Labs Lab 11/20/14 2245 11/22/14 0519 11/23/14 0426  NA 138 138 137  K 4.5 4.7 5.2*  CL 95* 93* 98  CO2 30 31 26   GLUCOSE 81 86 84  BUN 24* 40* 34*  CREATININE 6.70* 9.71* 7.55*  CALCIUM 9.4 9.1 8.4  PHOS  --  9.8* 7.6*     Recent Labs Lab 11/22/14 0519 11/23/14 0426  ALBUMIN 3.4* 3.5    Recent Labs Lab 11/20/14 2245 11/22/14 0519 11/23/14 0426  WBC 6.4 5.2 5.9  HGB 13.1 11.8* 11.1*  HCT 41.0 37.1* 35.0*  MCV 84.7 83.0 83.5  PLT 193 174 166     Recent Labs Lab 11/21/14 0355 11/21/14 0635 11/21/14 1137  TROPONINI <0.03 <0.03 <0.03   CBG:  Recent Labs Lab 11/22/14 0641 11/22/14 1149 11/22/14 1653 11/22/14 2050 11/23/14 0645  GLUCAP 77 123* 97  132* 84   Studies/Results: No results found. Medications:   . amitriptyline  100 mg Oral QHS  . amLODipine  5 mg Oral Daily  . aspirin EC  81 mg Oral Daily  . atorvastatin  40 mg Oral Daily  . calcium acetate  1,334 mg Oral TID WC  . carvedilol  50 mg Oral BID WC  . cinacalcet  60 mg Oral QHS  . clopidogrel  75 mg Oral Daily  . doxercalciferol  2 mcg Intravenous Q T,Th,Sa-HD  . [START ON 11/26/2014] ferric gluconate (FERRLECIT/NULECIT) IV  62.5 mg Intravenous Weekly  . heparin  5,000 Units Subcutaneous 3 times per day  . insulin aspart  0-9 Units Subcutaneous TID WC  . pantoprazole  40 mg Oral Daily  . pregabalin  100 mg Oral TID  . sevelamer carbonate  1,600 mg Oral Q1200  . sevelamer carbonate  2,400 mg Oral BID WC  . sodium chloride  3 mL Intravenous Q12H  . venlafaxine  37.5 mg Oral BID   Dialysis Orders: TTS @ AF 4 hrs 90 kg Will be lower at discharge 2K/2.25Ca 500/800 Heparin 8000 U AVF @ LUA Hectorol 2 mcg Aranesp 40 mcg on  Tues Venofer 50 mg on Fri  Assessment/Plan: 1. Chest pain / Dyspnea -EKG stable, troponins negative; Hx CAD s/p stents x 2 @ [facility]. Cath with widely patent LAD stent with some mild in-stent narrowing. For medical therapy. 2. ESRD - HD on TTS @ AF. Had HD yesterday off schedule so that cath could be done today.  Since K is 5.2 do not think would be appropriate to hold off on next HD until Thursday. Short treatment today to get back on usual TTS schedule. 3. Hypertension/volume - Was below 90 kg EDW post last outpt tmt at 89 kg. Was 89.7 yesterday post HD.  Don't know if weights are correct from this AM (doubt he gained 3 kg overnight). Will ask for standing scale weights in the HD unit today and use 89 as goal target weight.   4. Anemia - 13.1->11.1. Usual Aranesp 40 mcg on Tues's, Fe on Fri's. Holding Aranesp for now. 5. Metabolic bone disease - Last iPTH 247; Hectorol 2 mcg, Sensipar 60 mg qd, Renvela & Phoslo with meals.  Phos 9.8 down to 7.6 and I suspect due to  "forced compliance" as inpatient 6. Nutrition - renal carb-mod diet, vitamin.  7. DM Type 2 - insulin per primary 8. COPD - on inhaler   Jamal Maes, MD [facility] [phone] pager 11/23/2014, 9:37 AM

## 2014-11-23 NOTE — CV Procedure (Signed)
[name] [name] is a 60 y.o. male    [id number]  [id number] LOCATION:  FACILITY: [place]  PHYSICIAN: Troy Sine, MD, [facility] 03-27-1956   DATE OF PROCEDURE:  11/23/2014    CARDIAC CATHETERIZATION     HISTORY:    [name] [name] is a 59 y.o. male who has end-stage renal disease on dialysis, a history of hypertension, diabetes mellitus, and is status post stenting with bare-metal stents to his LAD in March 2011 and in January 2012.  He was recently admitted with recurrent episodes of chest pain.  The patient states he also has experienced chest pain at rest and while sleeping.  He was seen in consultation yesterday.  He now presents for definitive cardiac catheterization with possible percutaneous coronary intervention if necessary.   PROCEDURE:  Left heart catheterization: coronary angiography, left ventriculography.  The patient was brought to the [facility] cardiac catherization laboratory in the fasting state. He was premedicated with Versed 2 mg and fentanyl 50 g. His right groin was prepped and shaved in usual sterile fashion. Xylocaine 1% was used for local anesthesia. A 5 French sheath was inserted into the R femoral artery. Diagnostic catheterizatiion was done with 5 [place] FL4, FR4, and pigtail catheters. Left ventriculography was done with 25 cc Omnipaque contrast. Hemostasis was obtained by direct manual compression. The patient tolerated the procedure well.   HEMODYNAMICS:   Central Aorta: 105/56   Left Ventricle: 105/15  ANGIOGRAPHY:  Left main: Short angiographically normal vessel which immediately bifurcated into the LAD and left circumflex coronary artery.   LAD: Moderate size vessel that had awidely patent proximal to mid LAD stent.  There was minimal narrowing of the first diagonal branch at the ostium, which came off in the proximal portion of the stented segment which was not felt to be significant.  There was very minimal 10 - 20% narrowing in the mid distal portion  of the stent in the region of a small second diagonal branch takeoff.  The remainder of the LAD was free of significant disease.   Left circumflex: Large caliber angiographic normal vessel which gave rise to small first marginal and moderate size second marginal branch.  Right coronary artery: Large dominant normal RCA witch supplied a large PDA and PLA system without obstructive disease   Left ventriculography revealed normal LV function.  Ejection fraction is 60%.  There are no focal segmental wall motion abnormalities.  There was no evidence for mitral regurgitation.  Total contrast: 65 cc Omnipaque  IMPRESSION:  Normal LV function.  No significant coronary obstructive disease with widely patent LAD stent in the proximal to midsegment with minimal 20% smooth in-stent narrowing and mild 20% ostial narrowing of the first diagonal branch within the stented segment.  RECOMMENDATION:  Medical therapy.  The patient will be undergoing dialysis later today.    Troy Sine, MD, [facility] 11/23/2014 8:37 AM

## 2014-11-24 LAB — BASIC METABOLIC PANEL
Anion gap: 7 (ref 5–15)
BUN: 31 mg/dL — AB (ref 6–23)
CHLORIDE: 98 mmol/L (ref 96–112)
CO2: 30 mmol/L (ref 19–32)
Calcium: 8.6 mg/dL (ref 8.4–10.5)
Creatinine, Ser: 6.54 mg/dL — ABNORMAL HIGH (ref 0.50–1.35)
GFR calc Af Amer: 10 mL/min — ABNORMAL LOW (ref >90)
GFR, EST NON AFRICAN AMERICAN: 8 mL/min — AB (ref >90)
Glucose, Bld: 86 mg/dL (ref 70–99)
Potassium: 4.8 mmol/L (ref 3.5–5.1)
Sodium: 135 mmol/L (ref 135–145)

## 2014-11-24 LAB — GLUCOSE, CAPILLARY
Glucose-Capillary: 86 mg/dL (ref 70–99)
Glucose-Capillary: 120 mg/dL — ABNORMAL HIGH (ref 70–99)

## 2014-11-24 LAB — HEPATITIS B SURFACE ANTIGEN: Hepatitis B Surface Ag: NEGATIVE

## 2014-11-24 LAB — HEPATITIS B SURFACE ANTIBODY,QUALITATIVE: HEP B S AB: REACTIVE

## 2014-11-24 LAB — HEPATITIS B CORE ANTIBODY, TOTAL: HEP B C TOTAL AB: NEGATIVE

## 2014-11-24 MED ORDER — CLOPIDOGREL BISULFATE 75 MG PO TABS: 75.0000 mg | ORAL_TABLET | Freq: Every day | ORAL | Status: DC

## 2014-11-24 MED ORDER — RANOLAZINE ER 500 MG PO TB12: 500.0000 mg | ORAL_TABLET | Freq: Two times a day (BID) | ORAL | Status: DC

## 2014-11-24 MED ORDER — PANTOPRAZOLE SODIUM 40 MG PO TBEC: 40.0000 mg | DELAYED_RELEASE_TABLET | Freq: Every day | ORAL | Status: DC

## 2014-11-24 MED ORDER — RANOLAZINE ER 500 MG PO TB12
500.0000 mg | ORAL_TABLET | Freq: Two times a day (BID) | ORAL | Status: DC
  Administered 2014-11-24: 500 mg via ORAL
  Filled 2014-11-24 (×2): qty 1

## 2014-11-24 MED ORDER — RANOLAZINE ER 500 MG PO TB12
500.0000 mg | ORAL_TABLET | Freq: Two times a day (BID) | ORAL | Status: DC
  Filled 2014-11-24 (×2): qty 1

## 2014-11-24 NOTE — Care Management Note (Signed)
    Page 1 of 1   11/24/2014     2:18:21 PM CARE MANAGEMENT NOTE 11/24/2014  Patient:  [name] [name],[name] [name]   Account Number:  [id number]  Date Initiated:  11/23/2014  Documentation initiated by:  Marvetta Gibbons  Subjective/Objective Assessment:   Pt admitted with chest pain, plan for cath today     Action/Plan:   PTA pt lived at home with son   Anticipated DC Date:  11/24/2014   Anticipated DC Plan:  HOME/SELF CARE         Choice offered to / List presented to:             Status of service:  Completed, signed off Medicare Important Message given?  YES (If response is "NO", the following Medicare IM given date fields will be blank) Date Medicare IM given:  11/23/2014 Medicare IM given by:  Marvetta Gibbons Date Additional Medicare IM given:   Additional Medicare IM given by:    Discharge Disposition:  HOME/SELF CARE  Per UR Regulation:  Reviewed for med. necessity/level of care/duration of stay  If discussed at [place] of Stay Meetings, dates discussed:    Comments:

## 2014-11-24 NOTE — Discharge Instructions (Signed)
-  Go to your normal dialysis sessions -STOP taking norvasc since your blood pressure is low  -START taking protonix 40 mg daily for acid reflux for the next month, if it helps we can refill it for you -START taking ranexa 500 mg twice a day for your heart  -START taking plavix 75 mg daily for your heart and history of stroke -Please attend you hospital follow-up appointment with our clinic downstairs on the ground floor on 2/25 at 9:15 AM and with cardiology on 3/16 @ 9:15AM -Pleasure taking care of you, hope you were satisfied with our care       .

## 2014-11-24 NOTE — Progress Notes (Signed)
  Date: 11/24/2014  Patient name: [name] [name]  Medical record number: [id number]  Date of birth: 24-Nov-1955   This patient has been seen and the plan of care was discussed with the house staff. Please see their note for complete details. I concur with their findings with the following additions/corrections: Pt is now stable for D/C. He has transportation to HD and can arrange for MD appts when he knows in advance. He will F/U [facility], cards, HD, and renal. Agree with Dr Naaman Plummer to D/C amlodipine and f/U BP as outpt.   Bartholomew Crews, MD 11/24/2014, 10:57 AM

## 2014-11-24 NOTE — Progress Notes (Signed)
11/24/2014 1:22 PM Discharge AVS meds taken today and those due this evening reviewed.  Follow-up appointments and when to call md reviewed.  D/C IV and TELE.  Questions and concerns addressed.   D/C home per orders. [place]

## 2014-11-24 NOTE — Progress Notes (Signed)
Utilization review completed.  

## 2014-11-24 NOTE — Progress Notes (Signed)
[facility] Rounding Note: Subjective:  No further chest pain Says is going home this afternoon Had dialysis yesterday to get back on schedule  Objective Vital signs in last 24 hours: Filed Vitals:   11/23/14 1631 11/23/14 2053 11/24/14 0617 11/24/14 0900  BP: 104/63 132/70 132/72 121/67  Pulse: 81 90 74 76  Temp: 97.9 F (36.6 C) 99 F (37.2 C) 98.3 F (36.8 C)   TempSrc: Oral Oral Oral   Resp: 14 18 18    Height:      Weight: 92.5 kg (203 lb 14.8 oz)  93.3 kg (205 lb 11 oz)   SpO2: 96% 96% 100%    Weight change: 4.1 kg (9 lb 0.6 oz)  Intake/Output Summary (Last 24 hours) at 11/24/14 1051 Last data filed at 11/24/14 0800  Gross per 24 hour  Intake   1080 ml  Output   1569 ml  Net   -489 ml   Physical Exam:  BP 121/67 mmHg  Pulse 76  Temp(Src) 98.3 F (36.8 C) (Oral)  Resp 18  Ht 6' (1.829 m)  Wt 93.3 kg (205 lb 11 oz)  BMI 27.89 kg/m2  SpO2 100% General appearance:Lying in bed, NAD VS as noted Skin tattoos Neck: No JVD  Resp: Lungs clear Cardio: regular rate and rhythm, S1, S2 normal, no murmur GI: soft, non-tender Extremities: No edema. Right groin looks OK (cath site). Neurologic: Grossly normal Dialysis Access: aneurysmal AVF @ LUA  + bruit  Labs:  Recent Labs Lab 11/20/14 2245 11/22/14 0519 11/23/14 0426 11/24/14 0531  NA 138 138 137 135  K 4.5 4.7 5.2* 4.8  CL 95* 93* 98 98  CO2 30 31 26 30   GLUCOSE 81 86 84 86  BUN 24* 40* 34* 31*  CREATININE 6.70* 9.71* 7.55* 6.54*  CALCIUM 9.4 9.1 8.4 8.6  PHOS  --  9.8* 7.6*  --      Recent Labs Lab 11/22/14 0519 11/23/14 0426  ALBUMIN 3.4* 3.5    Recent Labs Lab 11/20/14 2245 11/22/14 0519 11/23/14 0426  WBC 6.4 5.2 5.9  HGB 13.1 11.8* 11.1*  HCT 41.0 37.1* 35.0*  MCV 84.7 83.0 83.5  PLT 193 174 166     Recent Labs Lab 11/21/14 0355 11/21/14 0635 11/21/14 1137  TROPONINI <0.03 <0.03 <0.03   CBG:  Recent Labs Lab 11/23/14 0843 11/23/14 1113 11/23/14 1658  11/23/14 2051 11/24/14 0614  GLUCAP 98 125* 101* 129* 86   Studies/Results: No results found. Medications: . sodium chloride     . amitriptyline  100 mg Oral QHS  . amLODipine  5 mg Oral Daily  . aspirin EC  81 mg Oral Daily  . atorvastatin  40 mg Oral Daily  . calcium acetate  1,334 mg Oral TID WC  . carvedilol  50 mg Oral BID WC  . cinacalcet  60 mg Oral QHS  . clopidogrel  75 mg Oral Daily  . doxercalciferol  2 mcg Intravenous Q T,Th,Sa-HD  . feeding supplement (NEPRO CARB STEADY)  237 mL Oral BID BM  . [START ON 11/26/2014] ferric gluconate (FERRLECIT/NULECIT) IV  62.5 mg Intravenous Weekly  . heparin  5,000 Units Subcutaneous 3 times per day  . insulin aspart  0-9 Units Subcutaneous TID WC  . pantoprazole  40 mg Oral Daily  . pregabalin  100 mg Oral TID  . ranolazine  500 mg Oral BID  . sevelamer carbonate  1,600 mg Oral Q1200  . sevelamer carbonate  2,400 mg Oral BID WC  .  venlafaxine  37.5 mg Oral BID   Dialysis Orders: TTS @ AF 4 hrs 90 kg Will be lower at discharge 2K/2.25Ca 500/800 Heparin 8000 U AVF @ LUA Hectorol 2 mcg Aranesp 40 mcg on Tues Venofer 50 mg on Fri  Assessment/Plan: 1. Chest pain / Dyspnea -resolved. EKG stable, troponins negative; Hx CAD s/p stents x 2 @ [facility]. Cath 2/16 with widely patent LAD stent with some mild in-stent narrowing, normal LV function. For medical therapy. 2. ESRD - HD on TTS @ AF. Had HD yesterday to get back on schedule.  Hospital weights are much higher than his outpt EDW - ? if difference in scales. At his last outpt TMT left at 89 kg (EDW 90)  Will use new EDW of 89 and reevaluate EDW as outpt.  3. Hypertension/volume -  meds + volume control 4. Anemia - 13.1->11.1. Usual Aranesp 40 mcg on Tues's, Fe on Fri's. Holding Aranesp for now. Will need recheck Hb at outpt center at discharge  5. Metabolic bone disease - Last iPTH 247; Hectorol 2 mcg, Sensipar 60 mg qd, Renvela & Phoslo with meals. Phos  9.8 down to 7.6 and I suspect due to  "forced compliance" as inpatient. Not checked today. No changes in binders. 6. Nutrition - renal carb-mod diet, vitamin.  7. DM Type 2 - insulin per primary 8. COPD - on inhaler 9. Dispo - fine for discharge from a renal standpoint   Jamal Maes, MD [place] [phone] pager 11/24/2014, 10:51 AM

## 2014-11-24 NOTE — Discharge Summary (Signed)
Name: [name] [name] MRN: [id number] DOB: September 17, 1956 59 y.o. PCP: [name]  Date of Admission: 11/20/2014 11:14 PM Date of Discharge: 11/24/2014 Attending Physician: Bartholomew Crews, MD  Discharge Diagnosis: Principal Problem  Recurrent Chest Pain Active Problems ESRD on HD TThS Secondary Hyperparathyroidism HTN CAD Anemia of Chronic Disease Non-insulin Type 2 DM Unintentional Weight Loss COPD Depression   Discharge Medications:   Medication List    STOP taking these medications        amLODipine 10 MG tablet  Commonly known as:  NORVASC      TAKE these medications        amitriptyline 100 MG tablet  Commonly known as:  ELAVIL  Take 100 mg by mouth at bedtime.     aspirin EC 81 MG tablet  Take 81 mg by mouth daily.     atorvastatin 40 MG tablet  Commonly known as:  LIPITOR  Take 40 mg by mouth daily.     calcium acetate 667 MG capsule  Commonly known as:  PHOSLO  Take 2 capsules (1,334 mg total) by mouth 3 (three) times daily with meals.     carvedilol 25 MG tablet  Commonly known as:  COREG  Take 50 mg by mouth 2 (two) times daily with a meal.     cinacalcet 30 MG tablet  Commonly known as:  SENSIPAR  Take 60 mg by mouth at bedtime.     clopidogrel 75 MG tablet  Commonly known as:  PLAVIX  Take 1 tablet (75 mg total) by mouth daily.     COMBIVENT RESPIMAT 20-100 MCG/ACT Aers respimat  Generic drug:  Ipratropium-Albuterol  Inhale 2 puffs into the lungs every 6 (six) hours as needed for wheezing.     insulin aspart 100 UNIT/ML injection  Commonly known as:  novoLOG  Inject 15-20 Units into the skin 3 (three) times daily before meals.     nitroGLYCERIN 0.4 MG SL tablet  Commonly known as:  NITROSTAT  Place 0.4 mg under the tongue every 5 (five) minutes as needed for chest pain.     pantoprazole 40 MG tablet  Commonly known as:  PROTONIX  Take 1 tablet (40 mg total) by mouth daily.     pregabalin 100 MG capsule  Commonly known  as:  LYRICA  Take 100 mg by mouth 3 (three) times daily.     ranolazine 500 MG 12 hr tablet  Commonly known as:  RANEXA  Take 1 tablet (500 mg total) by mouth 2 (two) times daily.     sevelamer carbonate 800 MG tablet  Commonly known as:  RENVELA  Take 1,600-2,400 mg by mouth 3 (three) times daily with meals. *takes 2400mg  in the morning, 1600mg  midday, and 2400mg  at night*     venlafaxine 37.5 MG tablet  Commonly known as:  EFFEXOR  Take 37.5 mg by mouth 2 (two) times daily.        Disposition and follow-up:   [name] [name] was discharged from [facility] in Good condition.  At the hospital follow up visit please address:  1.  Recurrent Chest Pain: Is patient still having chest pain? Is Ranexa helping? Is he taking both Plavix and aspirin? HTN: Recheck BP. Determine whether Amlodipine should be restarted.  Unintentional Weight Loss: ~50 lbs since April 2014. Further work up needed, potential sources lung and prostate. See below.   2.  Labs / imaging needed at time of follow-up: Consider PSA for prostate.  3.  Pending labs/ test needing follow-up: None  Follow-up Appointments:     Follow-up Information    Follow up with Jenkins Rouge, MD.   Specialty:  Cardiology   Why:  Office will contact you to arrange followup, please give [place] a call if you do not hear from [place] in 2 days   Contact information:   [address] [address] [phone]       Follow up with Blain Pais, MD On 12/02/2014.   Specialty:  Internal Medicine   Why:  9:15 AM   Contact informationDonovan Kail [place] [address] [phone]       Discharge Instructions:   Consultations: Treatment Team:  Sol Blazing, MD  Procedures Performed:  Dg Chest 2 View  11/20/2014   CLINICAL DATA:  Chest pain today.  Initial encounter.  EXAM: CHEST  2 VIEW  COMPARISON:  11/09/2014  FINDINGS: There is mild stable left hemidiaphragm elevation.  Pulmonary vasculature has essentially normalized since the study of 11/09/2014. Lungs are clear. There are no pleural effusions. Hilar, mediastinal and cardiac contours are unremarkable.  IMPRESSION: No active cardiopulmonary disease.   Electronically Signed   By: Andreas Newport M.D.   On: 11/20/2014 23:01   Dg Chest Port 1 View  11/09/2014   CLINICAL DATA:  Chest pain and difficulty breathing  EXAM: PORTABLE CHEST - 1 VIEW  COMPARISON:  April 26, 2014  FINDINGS: There is stable elevation of the left hemidiaphragm. The interstitium is prominent, probably representing chronic interstitial edema. Heart is mildly enlarged, and there is slight pulmonary venous hypertension. No adenopathy. No airspace consolidation. No bone lesions.  IMPRESSION: Findings felt to be consistent with a degree of chronic congestive heart failure. No appreciable change from prior study. No airspace consolidation.   Electronically Signed   By: Lowella Grip M.D.   On: 11/09/2014 07:02    Cardiac Cath:  Impression:  Normal LV function.  No significant coronary obstructive disease with widely patent LAD stent in the proximal to midsegment with minimal 20% smooth in-stent narrowing and mild 20% ostial narrowing of the first diagonal branch within the stented segment.   Admission HPI: [name] [name] is a 59 year old male with hypertension, end-stage renal disease, type 2 diabetes, coronary arteries disease status post stent who presents to the ED with chest pain.  Yesterday, he was receiving dialysis, and near the end of the session, he reported the onset of midsternal, sharp chest pain. He was given 2 tablets of Tylenol and dialysis and took another 2 when he got home though pain did not improve. Pain does not radiate or extend anywhere but is associated with feeling short of breath along with nausea and vomiting. Upon returning home, his pain did not improve, and he told his son to bring him to the hospital. He does not feel this  pain is similar in nature to the one preceding the insertion of his cardiac stents though does report this pain has been recurrent since the summer and improves once he vomits but has contributed to him losing roughly 50 pounds. Otherwise, he denies any recent NSAID use, new changes in vision, diarrhea, dysuria, hematochezia, hemoptysis, recent sick contact, recent illness, changes to his medication though notes mild dizziness. He was hospitalized from 11/09/14-11/10/14 for volume overload setting of having missed one session of dialysis. Since his last hospitalization, he is at home with his grandchildren and denies any recent tobacco, alcohol, illicit drug use.  In the ED, he was given aspirin 324 mg, morphine 4 mg IV, sublingual nitroglycerin which improved his pain.  Hospital Course by problem list:   Recurrent Chest Pain: Patient presented with substernal chest pain that improved with nitro and morphine, occurred at rest and on exertion, and was associated with dyspnea, nausea, dizziness, and palpitations. Patient has a history of CAD s/p PCI x 2. He underwent a recent Cardiolite study on 09/04/14 which was normal and showed no evidence of ischemia or infarction with EF 49% and mild global hypokinesis. His troponins were negative x 3 and EKG with no changes. Cardiology was consulted and recommended a cardiac cath, which showed a normal EF and no obstructive lesions. He was started on Ranexa 500 mg BID and Plavix 75 mg daily. Patient's chest pain resolved by time of discharge. He has follow up with both cardiology and the IM clinic.   ESRD on HD: On TThS schedule. Patient experienced soft BPs in 80's-100's during HD. His home Amlodipine was decreased to 5 mg daily and then held on discharge to compensate for his low BPs. He is scheduled to have his outpatient dialysis tomorrow.   Secondary Hyperparathyroidism: PTH elevated at 247. Patient was continued on Phoslo 1,334 mg TID.   HTN: BP initially elevated  in [id number] systolic upon admission which was thought to be secondary to pain. His BP then dropped to the 0000000 systolic after HD. His home Amlodipine was decreased to 5 mg daily, but given his continued low BPs his Amlodipine was discontinued upon discharge. He will need a BP recheck at his follow up IM visit.   CAD: Patient had stents placed to mid LAD in March 2011 (bare metal) and Jan 2012 (DES). He underwent a cardiac cath in Dec 2013 which showed 50% mid LAD stenosis and overall was unchanged from his prior study in 2012. Patient underwent another cardiac cath on 11/23/14 which showed no obstructive lesions and a normal EF. He was discharged on Coreg 50 mg BID, Lipitor 40 mg daily, Plavix 75 mg daily, and Ranexa 500 mg BID.   Anemia of Chronic Disease: Patient's Hbg remained stable in the 11-13 range with his baseline at 11. He had no active bleeding or hemodynamic instability. Anemia panel with elevated ferritin and low TIBC with normal iron levels consistent with anemia of chronic disease.   Non-insulin Type 2 DM: Last HbA1c 5.5 on 11/09/14. He was placed on an ISS and continued on his home Lyrica for neuropathic pain. He was discharged on his home Novolog.   Unintentional Weight Loss: Patient had reported additional weight loss since last hospitalization, stating he was down to 205 lbs. He also reported night sweats. He denied any dark or bloody stool. No lymphadenopathy noted on exam. He had reported a 36 lb weight loss since July 2015 during his last hospitalization on 11/09/14. His weight appears stable since last hospitalization at 210 lbs. Per care everywhere, his weight was 260 lbs in April 2014 then dropped to 240 lbs in August 2015. By Nov 2015 his weight dropped significantly to 217 lbs and then 208 lbs in Dec 2015. No mention of weight loss in prior notes. Patient was hospitalized for hematochezia at [facility] in October 2014. A colonoscopy was performed where two 30mm polyps were removed from the  transverse and ascending colon and five 2-3 mm polyps removed from the sigmoid, descending, transverse, and ascending colon, but unable to find path report for these polyps. Internal hemorrhoids were determined to be  the source of his bleeding. An EGD was also performed at that time which was normal. He had also been hospitalized for hematuria in July 2014 and CT urogram was normal. Patient does have a smoking history and warrants further malignancy work up as an outpatient given his weight loss. Potential sources include lung and prostate. Patient is scheduled to follow up in the IM clinic on 12/02/14.   COPD: Patient was continued on his home Combivent 2 puffs Q6H as needed for wheezing.   Depression: Patient was continued on his home Amitriptyline 100 mg QHS and Effexor 37.5 mg BID.    Discharge Vitals:   BP 121/67 mmHg  Pulse 76  Temp(Src) 98.3 F (36.8 C) (Oral)  Resp 18  Ht 6' (1.829 m)  Wt 205 lb 11 oz (93.3 kg)  BMI 27.89 kg/m2  SpO2 100% Physical Exam General: In HD, resting comfortably, NAD HEENT: [place]/AT, EOMI, mucus membranes moist  CV: RRR, continuous murmur present Pulm: CTA bilaterally, breaths non-labored Abd: BS+, soft, non-tender Ext: Warm, no edema, moves all. Left brachiocephalic AVF with palpable thrill. Multiple tattoos.  Neuro: Alert and oriented x 3, no focal deficits   Discharge Labs:  Results for orders placed or performed during the hospital encounter of 11/20/14 (from the past 24 hour(s))  Hepatitis B surface antigen     Status: None   Collection Time: 11/23/14  2:00 PM  Result Value Ref Range   Hepatitis B Surface Ag NEGATIVE NEGATIVE  Hepatitis B surface antibody     Status: None   Collection Time: 11/23/14  2:00 PM  Result Value Ref Range   Hep B S Ab Reactive   Hepatitis B core antibody, total     Status: None   Collection Time: 11/23/14  2:00 PM  Result Value Ref Range   Hep B Core Total Ab Negative Negative  Glucose, capillary     Status:  Abnormal   Collection Time: 11/23/14  4:58 PM  Result Value Ref Range   Glucose-Capillary 101 (H) 70 - 99 mg/dL   Comment 1 Documented in Char    Comment 2 Repeat Test   Glucose, capillary     Status: Abnormal   Collection Time: 11/23/14  8:51 PM  Result Value Ref Range   Glucose-Capillary 129 (H) 70 - 99 mg/dL   Comment 1 Documented in Char    Comment 2 Repeat Test   Basic metabolic panel     Status: Abnormal   Collection Time: 11/24/14  5:31 AM  Result Value Ref Range   Sodium 135 135 - 145 mmol/L   Potassium 4.8 3.5 - 5.1 mmol/L   Chloride 98 96 - 112 mmol/L   CO2 30 19 - 32 mmol/L   Glucose, Bld 86 70 - 99 mg/dL   BUN 31 (H) 6 - 23 mg/dL   Creatinine, Ser 6.54 (H) 0.50 - 1.35 mg/dL   Calcium 8.6 8.4 - 10.5 mg/dL   GFR calc non Af Amer 8 (L) >90 mL/min   GFR calc Af Amer 10 (L) >90 mL/min   Anion gap 7 5 - 15  Glucose, capillary     Status: None   Collection Time: 11/24/14  6:14 AM  Result Value Ref Range   Glucose-Capillary 86 70 - 99 mg/dL  Glucose, capillary     Status: Abnormal   Collection Time: 11/24/14 11:15 AM  Result Value Ref Range   Glucose-Capillary 120 (H) 70 - 99 mg/dL   Comment 1 Notify RN  Signed: Albin Felling, MD 11/24/2014, 11:38 AM    Services Ordered on Discharge: None Equipment Ordered on Discharge: None

## 2014-11-24 NOTE — Progress Notes (Signed)
Subjective: Patient states he feels well today. Denies any chest pain, dyspnea, abdominal pain, palpitations.   Objective: Vital signs in last 24 hours: Filed Vitals:   11/23/14 1631 11/23/14 2053 11/24/14 0617 11/24/14 0900  BP: 104/63 132/70 132/72 121/67  Pulse: 81 90 74 76  Temp: 97.9 F (36.6 C) 99 F (37.2 C) 98.3 F (36.8 C)   TempSrc: Oral Oral Oral   Resp: 14 18 18    Height:      Weight: 203 lb 14.8 oz (92.5 kg)  205 lb 11 oz (93.3 kg)   SpO2: 96% 96% 100%    Weight change: 9 lb 0.6 oz (4.1 kg)  Intake/Output Summary (Last 24 hours) at 11/24/14 1113 Last data filed at 11/24/14 0800  Gross per 24 hour  Intake   1080 ml  Output   1569 ml  Net   -489 ml   Physical Exam General: In HD, resting comfortably, NAD HEENT: [place]/AT, EOMI, mucus membranes moist  CV: RRR, continuous murmur present Pulm: CTA bilaterally, breaths non-labored Abd: BS+, soft, non-tender Ext: Warm, no edema, moves all. Left brachiocephalic AVF with palpable thrill. Multiple tattoos.  Neuro: Alert and oriented x 3, no focal deficits   Lab Results: Basic Metabolic Panel:  Recent Labs Lab 11/22/14 0519 11/23/14 0426 11/24/14 0531  NA 138 137 135  K 4.7 5.2* 4.8  CL 93* 98 98  CO2 31 26 30   GLUCOSE 86 84 86  BUN 40* 34* 31*  CREATININE 9.71* 7.55* 6.54*  CALCIUM 9.1 8.4 8.6  PHOS 9.8* 7.6*  --    Liver Function Tests:  Recent Labs Lab 11/22/14 0519 11/23/14 0426  ALBUMIN 3.4* 3.5   CBC:  Recent Labs Lab 11/22/14 0519 11/23/14 0426  WBC 5.2 5.9  HGB 11.8* 11.1*  HCT 37.1* 35.0*  MCV 83.0 83.5  PLT 174 166   Cardiac Enzymes:  Recent Labs Lab 11/21/14 0355 11/21/14 0635 11/21/14 1137  TROPONINI <0.03 <0.03 <0.03   CBG:  Recent Labs Lab 11/23/14 0645 11/23/14 0843 11/23/14 1113 11/23/14 1658 11/23/14 2051 11/24/14 0614  GLUCAP 84 98 125* 101* 129* 86   Coagulation:  Recent Labs Lab 11/23/14 0426  LABPROT 13.8  INR 1.05   Anemia  Panel:  Recent Labs Lab 11/23/14 0426  VITAMINB12 290  FOLATE 6.5  FERRITIN 1129*  TIBC 180*  IRON 62  RETICCTPCT 2.1   Medications: I have reviewed the patient's current medications. Scheduled Meds: . amitriptyline  100 mg Oral QHS  . amLODipine  5 mg Oral Daily  . aspirin EC  81 mg Oral Daily  . atorvastatin  40 mg Oral Daily  . calcium acetate  1,334 mg Oral TID WC  . carvedilol  50 mg Oral BID WC  . cinacalcet  60 mg Oral QHS  . clopidogrel  75 mg Oral Daily  . doxercalciferol  2 mcg Intravenous Q T,Th,Sa-HD  . feeding supplement (NEPRO CARB STEADY)  237 mL Oral BID BM  . [START ON 11/26/2014] ferric gluconate (FERRLECIT/NULECIT) IV  62.5 mg Intravenous Weekly  . heparin  5,000 Units Subcutaneous 3 times per day  . insulin aspart  0-9 Units Subcutaneous TID WC  . pantoprazole  40 mg Oral Daily  . pregabalin  100 mg Oral TID  . ranolazine  500 mg Oral BID  . sevelamer carbonate  1,600 mg Oral Q1200  . sevelamer carbonate  2,400 mg Oral BID WC  . venlafaxine  37.5 mg Oral BID   Continuous Infusions: .  sodium chloride     PRN Meds:.acetaminophen, gi cocktail, ipratropium-albuterol, morphine injection, nitroGLYCERIN, ondansetron (ZOFRAN) IV Assessment/Plan:  Recurrent Chest Pain: Patient had cath yesterday (2/16) which showed no obstructive lesions. Ranexa started yesterday as well. He denies any chest pain currently. He will follow up in the IM clinic on 2/25.  - Appreciate Cardiology recommendations - Continue Ranexa 500 mg BID - Continue Nitroglycerin PRN - Continue ASA 81 mg daily - Hold Amlodipine given low BP after HD. Will hold on discharge, re-evaluate BP at outpatient visit. - Continue Coreg 50 mg BID - Continue Lipitor 40 mg daily - Continue Protonix 40 mg daily - Continue Plavix 75 mg daily  ESRD on HD TThS: Last had HD yesterday. Will go for outpatient HD tomorrow. He has secondary hyperparathyroidism with elevated PTH 247.  - Appreciate Nephrology  recommendations - Continue home Renvela 1600-2400 mg TID - Continue Phoslo 1,334 mg TID - Continue Sensipar 60 mg QHS - Continue Hectoral 2 mcg with HD  HTN: BP low with HD. BP 85/56 yesterday.  - Hold Amlodipine upon discharge. Check BP at outpatient visit.  - Continue Coreg 50 mg BID  CAD: Cath yesterday with no obstructive lesions, normal EF. - Continue ASA 81 mg daily - Hold Amlodipine as above - Continue Coreg 50 mg BID - Continue Lipitor 40 mg daily - Continue Plavix 75 mg daily - Continue Ranexa 500 mg BID  Anemia of Chronic Disease: Hbg 11.1 yesterday, his baseline is ~11. - Hold Aranesp 40 mcg Tue and IV ferric gluconate on Fri  Non-insulin Type 2 DM: Blood glucose in 80s-120s.  - Continue ISS - Continue Lyrica 100 mg TID for neuropathic pain   Unintentional Weight Loss: See prior notes. - Will need further work up as outpatient. Has follow up appt in IM clinic   COPD: Stable. - Continue Duonebs 2 puffs Q6H PRN wheezing   Depression: Stable. - Continue Amitriptyline 100 mg QHS - Continue Effexor 37.5 mg BID  Diet: Renal/Carb modified with 1200 ml fluid restriction VTE PPx: Heparin SQ Dispo: Discharge home today  The patient does not have a current PCP ([name]) and does need an [facility] hospital follow-up appointment after discharge.  The patient does not have transportation limitations that hinder transportation to clinic appointments.  .Services Needed at time of discharge: Y = Yes, Blank = No PT:   OT:   RN:   Equipment:   Other:     LOS: 3 days   Albin Felling, MD 11/24/2014, 11:13 AM

## 2014-12-01 ENCOUNTER — Emergency Department (HOSPITAL_COMMUNITY)
Admission: EM | Admit: 2014-12-01 | Discharge: 2014-12-02 | Disposition: A | Discharge status: Home / Self Care | Payer: Medicare Other | Attending: Emergency Medicine | Admitting: Emergency Medicine

## 2014-12-01 ENCOUNTER — Emergency Department (HOSPITAL_COMMUNITY): Payer: Medicare Other

## 2014-12-01 ENCOUNTER — Telehealth: Payer: Self-pay | Admitting: Internal Medicine

## 2014-12-01 ENCOUNTER — Encounter (HOSPITAL_COMMUNITY): Payer: Self-pay | Admitting: *Deleted

## 2014-12-01 DIAGNOSIS — Z7982 Long term (current) use of aspirin: Secondary | ICD-10-CM | POA: Insufficient documentation

## 2014-12-01 DIAGNOSIS — Z79899 Other long term (current) drug therapy: Secondary | ICD-10-CM | POA: Diagnosis not present

## 2014-12-01 DIAGNOSIS — Z86010 Personal history of colonic polyps: Secondary | ICD-10-CM | POA: Insufficient documentation

## 2014-12-01 DIAGNOSIS — E1140 Type 2 diabetes mellitus with diabetic neuropathy, unspecified: Secondary | ICD-10-CM | POA: Diagnosis not present

## 2014-12-01 DIAGNOSIS — I5032 Chronic diastolic (congestive) heart failure: Secondary | ICD-10-CM | POA: Insufficient documentation

## 2014-12-01 DIAGNOSIS — Z992 Dependence on renal dialysis: Secondary | ICD-10-CM | POA: Insufficient documentation

## 2014-12-01 DIAGNOSIS — F329 Major depressive disorder, single episode, unspecified: Secondary | ICD-10-CM | POA: Insufficient documentation

## 2014-12-01 DIAGNOSIS — Z794 Long term (current) use of insulin: Secondary | ICD-10-CM | POA: Diagnosis not present

## 2014-12-01 DIAGNOSIS — Z7902 Long term (current) use of antithrombotics/antiplatelets: Secondary | ICD-10-CM | POA: Diagnosis not present

## 2014-12-01 DIAGNOSIS — D649 Anemia, unspecified: Secondary | ICD-10-CM | POA: Insufficient documentation

## 2014-12-01 DIAGNOSIS — R079 Chest pain, unspecified: Secondary | ICD-10-CM | POA: Diagnosis present

## 2014-12-01 DIAGNOSIS — R1013 Epigastric pain: Secondary | ICD-10-CM | POA: Diagnosis not present

## 2014-12-01 DIAGNOSIS — R112 Nausea with vomiting, unspecified: Secondary | ICD-10-CM | POA: Insufficient documentation

## 2014-12-01 DIAGNOSIS — N186 End stage renal disease: Secondary | ICD-10-CM | POA: Insufficient documentation

## 2014-12-01 DIAGNOSIS — I120 Hypertensive chronic kidney disease with stage 5 chronic kidney disease or end stage renal disease: Secondary | ICD-10-CM | POA: Insufficient documentation

## 2014-12-01 DIAGNOSIS — I2510 Atherosclerotic heart disease of native coronary artery without angina pectoris: Secondary | ICD-10-CM | POA: Insufficient documentation

## 2014-12-01 LAB — CBC WITH DIFFERENTIAL/PLATELET
BASOS ABS: 0 10*3/uL (ref 0.0–0.1)
Basophils Relative: 0 % (ref 0–1)
Eosinophils Absolute: 0.2 10*3/uL (ref 0.0–0.7)
Eosinophils Relative: 3 % (ref 0–5)
HEMATOCRIT: 39.5 % (ref 39.0–52.0)
HEMOGLOBIN: 12.5 g/dL — AB (ref 13.0–17.0)
LYMPHS PCT: 33 % (ref 12–46)
Lymphs Abs: 2.1 10*3/uL (ref 0.7–4.0)
MCH: 26.5 pg (ref 26.0–34.0)
MCHC: 31.6 g/dL (ref 30.0–36.0)
MCV: 83.9 fL (ref 78.0–100.0)
Monocytes Absolute: 0.6 10*3/uL (ref 0.1–1.0)
Monocytes Relative: 10 % (ref 3–12)
NEUTROS ABS: 3.5 10*3/uL (ref 1.7–7.7)
Neutrophils Relative %: 54 % (ref 43–77)
Platelets: 176 10*3/uL (ref 150–400)
RBC: 4.71 MIL/uL (ref 4.22–5.81)
RDW: 16.8 % — AB (ref 11.5–15.5)
WBC: 6.4 10*3/uL (ref 4.0–10.5)

## 2014-12-01 LAB — URINALYSIS, DIPSTICK ONLY
Bilirubin Urine: NEGATIVE
Glucose, UA: 100 mg/dL — AB
KETONES UR: NEGATIVE mg/dL
NITRITE: NEGATIVE
Protein, ur: 100 mg/dL — AB
Specific Gravity, Urine: 1.012 (ref 1.005–1.030)
Urobilinogen, UA: 0.2 mg/dL (ref 0.0–1.0)
pH: 8 (ref 5.0–8.0)

## 2014-12-01 MED ORDER — ONDANSETRON HCL 4 MG/2ML IJ SOLN
4.0000 mg | INTRAMUSCULAR | Status: AC
  Administered 2014-12-01: 4 mg via INTRAVENOUS
  Filled 2014-12-01: qty 2

## 2014-12-01 MED ORDER — GI COCKTAIL ~~LOC~~
30.0000 mL | Freq: Once | ORAL | Status: AC
  Administered 2014-12-01: 30 mL via ORAL
  Filled 2014-12-01: qty 30

## 2014-12-01 NOTE — Telephone Encounter (Signed)
Call to patient to confirm appointment for 12/02/14 at 9:15.lmtcb

## 2014-12-01 NOTE — ED Notes (Signed)
Pt states intermittent central cp that radiates to stomach, denies any back pain or neck pain. States also some emesis and diarrhea. Denies any fevers, reports recent stent placed due to "blockage."

## 2014-12-01 NOTE — ED Provider Notes (Signed)
CSN: [id number]     Arrival date & time 12/01/14  2058 History   First MD Initiated Contact with Patient 12/01/14 2236     Chief Complaint  Patient presents with  . Chest Pain  . Emesis   (Consider location/radiation/quality/duration/timing/severity/associated sxs/prior Treatment) HPI  [name] [name] is a 59 yo male presenting with report of chest pain onset this am.  He states he was sitting still when he began having sharp chest pain that radiated down to his stomach.  He states occasionally he feels like it goes to his right arm also but not all the time.  He reports ongoing nausea and vomiting for several months after he eats.  He reports 3 episodes of vomiting today but not unlike his usual vomiting.  He denies any fever, chills, cough, shortness of breath, bilious or bloody emesis or back pain.   Past Medical History  Diagnosis Date  . Hypertension   . ESRD on dialysis   . Diabetes mellitus with nephropathy   . Hemodialysis patient   . Hematochezia     a. 2014: colonscopy, which showed moderately-sized internal hemorrhoids, two 29mm polyps in transverse colon and ascending colon that were resected, five 2-55mm polyps in sigmoid colon, descending colon, transverse colon, and ascending colon that were resected. An upper endoscopy was performed and showed normal esophagus, stomach, and duodenum.  . Hematuria     a. H/o hematuria 2014 with cystoscopy that was unrevealing for his source of hematuria. He underwent a kidney ultrasound on 10/14 that showed mildly echogenic and scarred kidneys compatible with medical renal disease, without hydronephrosis or renal calculi.  [name] Anemia   . Depression   . CAD (coronary artery disease)     a. per [facility] s/p BMS to mid LAD 12/2009 and DES to mid LAD 10/2010.  . Colon polyps   . Chronic diastolic CHF (congestive heart failure)    Past Surgical History  Procedure Laterality Date  . Left heart catheterization with coronary angiogram N/A  11/23/2014    Procedure: LEFT HEART CATHETERIZATION WITH CORONARY ANGIOGRAM;  Surgeon: Troy Sine, MD;  Location: [facility] CATH LAB;  Service: Cardiovascular;  Laterality: N/A;   Family History  Problem Relation Age of Onset  . Hypertension     History  Substance Use Topics  . Smoking status: Never Smoker   . Smokeless tobacco: Never Used  . Alcohol Use: No    Review of Systems  Constitutional: Negative for fever and chills.  HENT: Negative for sore throat.   Eyes: Negative for visual disturbance.  Respiratory: Negative for cough and shortness of breath.   Cardiovascular: Positive for chest pain. Negative for leg swelling.  Gastrointestinal: Positive for vomiting. Negative for nausea and diarrhea.  Genitourinary: Negative for dysuria.  Musculoskeletal: Negative for myalgias.  Skin: Negative for rash.  Neurological: Negative for weakness, numbness and headaches.      Allergies  Enalapril  Home Medications   Prior to Admission medications   Medication Sig Start Date End Date Taking? Authorizing Provider  acetaminophen (TYLENOL) 500 MG tablet Take 500 mg by mouth every 6 (six) hours as needed for headache. Takes on dialysis Tuesday, Thursday, and Saturday   Yes Historical Provider, MD  amitriptyline (ELAVIL) 100 MG tablet Take 100 mg by mouth at bedtime.   Yes Historical Provider, MD  aspirin EC 81 MG tablet Take 81 mg by mouth daily.   Yes Historical Provider, MD  atorvastatin (LIPITOR) 40 MG tablet Take 40 mg by mouth daily.  Yes Historical Provider, MD  calcium acetate (PHOSLO) 667 MG capsule Take 2 capsules (1,334 mg total) by mouth 3 (three) times daily with meals. 11/10/14  Yes Albin Felling, MD  carvedilol (COREG) 25 MG tablet Take 50 mg by mouth 2 (two) times daily with a meal.   Yes Historical Provider, MD  cinacalcet (SENSIPAR) 30 MG tablet Take 30 mg by mouth at bedtime.    Yes Historical Provider, MD  clopidogrel (PLAVIX) 75 MG tablet Take 1 tablet (75 mg total) by mouth  daily. 11/24/14  Yes Marjan Rabbani, MD  insulin aspart (NOVOLOG) 100 UNIT/ML injection Inject 15-20 Units into the skin 3 (three) times daily as needed for high blood sugar.    Yes Historical Provider, MD  insulin glargine (LANTUS) 100 UNIT/ML injection Inject 10 Units into the skin at bedtime.   Yes Historical Provider, MD  Ipratropium-Albuterol (COMBIVENT RESPIMAT) 20-100 MCG/ACT AERS respimat Inhale 2 puffs into the lungs every 6 (six) hours as needed for wheezing.   Yes Historical Provider, MD  meclizine (ANTIVERT) 25 MG tablet Take 25 mg by mouth 3 (three) times daily as needed for dizziness.   Yes Historical Provider, MD  Multiple Vitamins-Minerals (ONE DAILY MULTIVITAMIN MEN PO) Take 1 tablet by mouth daily.   Yes Historical Provider, MD  nitroGLYCERIN (NITROSTAT) 0.4 MG SL tablet Place 0.4 mg under the tongue every 5 (five) minutes as needed for chest pain.   Yes Historical Provider, MD  pantoprazole (PROTONIX) 40 MG tablet Take 1 tablet (40 mg total) by mouth daily. 11/24/14  Yes Juluis Mire, MD  pregabalin (LYRICA) 100 MG capsule Take 100 mg by mouth 3 (three) times daily.   Yes Historical Provider, MD  ranolazine (RANEXA) 500 MG 12 hr tablet Take 1 tablet (500 mg total) by mouth 2 (two) times daily. 11/24/14  Yes Juluis Mire, MD  sevelamer carbonate (RENVELA) 800 MG tablet Take 1,600-2,400 mg by mouth 3 (three) times daily with meals. *takes 2400mg  in the morning, 1600mg  midday, and 2400mg  at night*   Yes Historical Provider, MD  venlafaxine (EFFEXOR) 37.5 MG tablet Take 37.5 mg by mouth 2 (two) times daily.   Yes Historical Provider, MD   BP 175/103 mmHg  Pulse 100  Temp(Src) 98.3 F (36.8 C)  Resp 16  SpO2 97% Physical Exam  Constitutional: He appears well-developed and well-nourished. No distress.  HENT:  Head: Normocephalic and atraumatic.  Mouth/Throat: Oropharynx is clear and moist. No oropharyngeal exudate.  Eyes: Conjunctivae are normal.  Neck: Neck supple. No  thyromegaly present.  Cardiovascular: Normal rate, regular rhythm and intact distal pulses.   Pulmonary/Chest: Effort normal and breath sounds normal. No respiratory distress. He has no wheezes. He has no rales. He exhibits tenderness.    Abdominal: Soft. He exhibits no distension and no mass. There is no hepatosplenomegaly. There is tenderness in the epigastric area. There is no rigidity, no rebound, no guarding, no CVA tenderness, no tenderness at McBurney's point and negative Murphy's sign.    Musculoskeletal: He exhibits no tenderness.  Lymphadenopathy:    He has no cervical adenopathy.  Neurological: He is alert.  Skin: Skin is warm and dry. No rash noted. He is not diaphoretic.  Psychiatric: He has a normal mood and affect.  Nursing note and vitals reviewed.   ED Course  Procedures (including critical care time) Labs Review Labs Reviewed  CBC WITH DIFFERENTIAL/PLATELET - Abnormal; Notable for the following:    Hemoglobin 12.5 (*)    RDW 16.8 (*)  All other components within normal limits  COMPREHENSIVE METABOLIC PANEL - Abnormal; Notable for the following:    Chloride 95 (*)    BUN 31 (*)    Creatinine, Ser 9.84 (*)    GFR calc non Af Amer 5 (*)    GFR calc Af Amer 6 (*)    Anion gap 18 (*)    All other components within normal limits  LIPASE, BLOOD - Abnormal; Notable for the following:    Lipase 60 (*)    All other components within normal limits  URINALYSIS, DIPSTICK ONLY - Abnormal; Notable for the following:    Glucose, UA 100 (*)    Hgb urine dipstick SMALL (*)    Protein, ur 100 (*)    Leukocytes, UA TRACE (*)    All other components within normal limits  TROPONIN I    Imaging Review Dg Chest 2 View  12/01/2014   CLINICAL DATA:  59 year old male with a history of chest pain  EXAM: CHEST - 2 VIEW  COMPARISON:  11/20/2014  FINDINGS: Cardiomediastinal silhouette projects within normal limits in size and contour.  Prominence of the central vasculature with  perihilar/peribronchial opacities bilaterally.  No confluent airspace disease.  No pleural effusion.  Low lung volumes with coarsened interstitial markings. Evidence of prior coronary artery stenting.  No displaced fracture.  Unremarkable appearance of the upper abdomen.  IMPRESSION: Low lung volumes with evidence of developing central vascular congestion/early edema. No evidence of lobar pneumonia or pleural effusion.  Changes of prior coronary artery stenting.  Signed,  Dulcy Fanny. Earleen Newport, DO  [facility]  [facility]  Signed,  Dulcy Fanny. Earleen Newport, DO  [facility]  [facility]   Electronically Signed   By: Corrie Mckusick D.O.   On: 12/01/2014 22:24     EKG Interpretation   Date/Time:  Wednesday December 01 2014 21:01:13 EST Ventricular Rate:  116 PR Interval:  136 QRS Duration: 96 QT Interval:  332 QTC Calculation: 461 R Axis:   64 Text Interpretation:  Sinus tachycardia Minimal voltage criteria for LVH,  may be normal variant Borderline ECG Since previous tracing rate faster  Confirmed by [facility]  MD, MARTHA [phone]) on 12/01/2014 9:35:43 PM      MDM   Final diagnoses:  Chest pain, unspecified chest pain type   59 yo with recurrent chest pain and chronic nausea and vomiting.  EKG without acute abnormalities, negative troponin, and negative CXR.  He had a recent heart cath on 2/16, which showed No significant coronary obstructive disease.  Symptoms improved in the ED.  Significant diagnostic findings include creatinine 9.84 and early pulmonary edema consistent with ESRD.  Pt is in no acute distress currently and is scheduled for his dialysis treatment tomorrow. Pt has been advised to return to the ED is CP becomes exertional, associated with diaphoresis or nausea, radiates to left jaw/arm, worsens or becomes concerning in any way. Patient is to be discharged with recommendation to follow up with PCP in  regards to today's hospital visit.   Case has been discussed with and seen by Dr. Stark Jock who agrees with the above plan to discharge.    Filed Vitals:   12/01/14 2315 12/01/14 2345 12/02/14 0015 12/02/14 0045  BP:   174/82 186/89  Pulse: 95 99 93 94  Temp:      Resp: 17 17  20   SpO2: 97% 98% 97% 99%   Meds given in ED:  Medications  ondansetron (  ZOFRAN) injection 4 mg (4 mg Intravenous Given 12/01/14 2318)  gi cocktail (Maalox,Lidocaine,Donnatal) (30 mLs Oral Given 12/01/14 2318)    Discharge Medication List as of 12/02/2014 12:33 AM    START taking these medications   Details  ondansetron (ZOFRAN) 4 MG tablet Take 1 tablet (4 mg total) by mouth every 6 (six) hours., Starting 12/02/2014, Until Discontinued, Print           Britt Bottom, NP 12/02/14 2329  Veryl Speak, MD 12/02/14 [phone]

## 2014-12-01 NOTE — ED Notes (Signed)
Received phone call from lab, not enough blood in light green tube need to recollect labs.

## 2014-12-01 NOTE — ED Notes (Signed)
CP off and on all day recent cath 2/16 and emesis

## 2014-12-02 ENCOUNTER — Ambulatory Visit: Payer: Medicare Other | Admitting: Internal Medicine

## 2014-12-02 LAB — COMPREHENSIVE METABOLIC PANEL
ALBUMIN: 4 g/dL (ref 3.5–5.2)
ALK PHOS: 65 U/L (ref 39–117)
ALT: 11 U/L (ref 0–53)
ANION GAP: 18 — AB (ref 5–15)
AST: 15 U/L (ref 0–37)
BUN: 31 mg/dL — AB (ref 6–23)
CHLORIDE: 95 mmol/L — AB (ref 96–112)
CO2: 27 mmol/L (ref 19–32)
CREATININE: 9.84 mg/dL — AB (ref 0.50–1.35)
Calcium: 9.4 mg/dL (ref 8.4–10.5)
GFR calc Af Amer: 6 mL/min — ABNORMAL LOW (ref >90)
GFR calc non Af Amer: 5 mL/min — ABNORMAL LOW (ref >90)
GLUCOSE: 71 mg/dL (ref 70–99)
Potassium: 4.7 mmol/L (ref 3.5–5.1)
Sodium: 140 mmol/L (ref 135–145)
Total Bilirubin: 0.5 mg/dL (ref 0.3–1.2)
Total Protein: 8 g/dL (ref 6.0–8.3)

## 2014-12-02 LAB — TROPONIN I: Troponin I: <0.03 ng/mL (ref <0.031)

## 2014-12-02 LAB — LIPASE, BLOOD: Lipase: 60 U/L — ABNORMAL HIGH (ref 11–59)

## 2014-12-02 MED ORDER — ONDANSETRON HCL 4 MG PO TABS: 4.0000 mg | ORAL_TABLET | Freq: Four times a day (QID) | ORAL | Status: DC

## 2014-12-02 NOTE — Discharge Instructions (Signed)
Provided. Be sure to follow-up with your primary doctor and cardiologist further management of your chest pain and this chronic nausea and vomiting. Take the nausea medicine as needed. Be sure to attend your dialysis appointment tomorrow. Don't hesitate to return for any new, worsening, or concerning symptoms.   SEEK IMMEDIATE MEDICAL CARE IF:  You have increased chest pain or pain that spreads to your arm, neck, jaw, back, or abdomen.  You have shortness of breath.  You have an increasing cough, or you cough up blood.  You have severe back or abdominal pain.  You feel nauseous or vomit.  You have severe weakness.  You faint.  You have chills. This is an emergency. Do not wait to see if the pain will go away. Get medical help at once. Call your local emergency services (911 in [place] Do not drive yourself to the hospital.

## 2014-12-04 ENCOUNTER — Encounter (HOSPITAL_COMMUNITY): Payer: Self-pay | Admitting: *Deleted

## 2014-12-04 DIAGNOSIS — N186 End stage renal disease: Secondary | ICD-10-CM | POA: Diagnosis not present

## 2014-12-04 DIAGNOSIS — Z992 Dependence on renal dialysis: Secondary | ICD-10-CM | POA: Insufficient documentation

## 2014-12-04 DIAGNOSIS — I5032 Chronic diastolic (congestive) heart failure: Secondary | ICD-10-CM | POA: Insufficient documentation

## 2014-12-04 DIAGNOSIS — I2510 Atherosclerotic heart disease of native coronary artery without angina pectoris: Secondary | ICD-10-CM | POA: Insufficient documentation

## 2014-12-04 DIAGNOSIS — I120 Hypertensive chronic kidney disease with stage 5 chronic kidney disease or end stage renal disease: Secondary | ICD-10-CM | POA: Diagnosis not present

## 2014-12-04 DIAGNOSIS — R079 Chest pain, unspecified: Secondary | ICD-10-CM | POA: Insufficient documentation

## 2014-12-04 DIAGNOSIS — E1121 Type 2 diabetes mellitus with diabetic nephropathy: Secondary | ICD-10-CM | POA: Insufficient documentation

## 2014-12-04 LAB — COMPREHENSIVE METABOLIC PANEL
ALBUMIN: 4.5 g/dL (ref 3.5–5.2)
ALT: 10 U/L (ref 0–53)
AST: 14 U/L (ref 0–37)
Alkaline Phosphatase: 62 U/L (ref 39–117)
Anion gap: 13 (ref 5–15)
BILIRUBIN TOTAL: 0.3 mg/dL (ref 0.3–1.2)
BUN: 19 mg/dL (ref 6–23)
CHLORIDE: 94 mmol/L — AB (ref 96–112)
CO2: 32 mmol/L (ref 19–32)
CREATININE: 7.81 mg/dL — AB (ref 0.50–1.35)
Calcium: 9.6 mg/dL (ref 8.4–10.5)
GFR calc non Af Amer: 7 mL/min — ABNORMAL LOW (ref >90)
GFR, EST AFRICAN AMERICAN: 8 mL/min — AB (ref >90)
GLUCOSE: 92 mg/dL (ref 70–99)
Potassium: 4.6 mmol/L (ref 3.5–5.1)
SODIUM: 139 mmol/L (ref 135–145)
Total Protein: 8.9 g/dL — ABNORMAL HIGH (ref 6.0–8.3)

## 2014-12-04 LAB — CBC WITH DIFFERENTIAL/PLATELET
Basophils Absolute: 0 10*3/uL (ref 0.0–0.1)
Basophils Relative: 1 % (ref 0–1)
Eosinophils Absolute: 0.4 10*3/uL (ref 0.0–0.7)
Eosinophils Relative: 6 % — ABNORMAL HIGH (ref 0–5)
HEMATOCRIT: 39.8 % (ref 39.0–52.0)
HEMOGLOBIN: 12.8 g/dL — AB (ref 13.0–17.0)
LYMPHS PCT: 23 % (ref 12–46)
Lymphs Abs: 1.5 10*3/uL (ref 0.7–4.0)
MCH: 26.7 pg (ref 26.0–34.0)
MCHC: 32.2 g/dL (ref 30.0–36.0)
MCV: 82.9 fL (ref 78.0–100.0)
Monocytes Absolute: 0.4 10*3/uL (ref 0.1–1.0)
Monocytes Relative: 7 % (ref 3–12)
NEUTROS PCT: 63 % (ref 43–77)
Neutro Abs: 4.1 10*3/uL (ref 1.7–7.7)
PLATELETS: 204 10*3/uL (ref 150–400)
RBC: 4.8 MIL/uL (ref 4.22–5.81)
RDW: 16.5 % — ABNORMAL HIGH (ref 11.5–15.5)
WBC: 6.5 10*3/uL (ref 4.0–10.5)

## 2014-12-04 LAB — TROPONIN I: Troponin I: 0.03 ng/mL (ref <0.031)

## 2014-12-04 NOTE — ED Notes (Signed)
Patient presents with c/o CP that started after dialysis.  Patient had a cath on 2/16 and states he has not been feeling any different still having CP

## 2014-12-05 ENCOUNTER — Emergency Department (HOSPITAL_COMMUNITY)
Admission: EM | Admit: 2014-12-05 | Discharge: 2014-12-05 | Discharge status: Against Medical Advice | Payer: Medicare Other | Attending: Emergency Medicine | Admitting: Emergency Medicine

## 2014-12-05 NOTE — ED Notes (Signed)
Called x's 3 without success

## 2014-12-06 ENCOUNTER — Encounter (HOSPITAL_COMMUNITY): Payer: Self-pay | Admitting: Emergency Medicine

## 2014-12-06 ENCOUNTER — Emergency Department (HOSPITAL_COMMUNITY)
Admission: EM | Admit: 2014-12-06 | Discharge: 2014-12-06 | Disposition: A | Discharge status: Home / Self Care | Payer: Medicare Other | Attending: Emergency Medicine | Admitting: Emergency Medicine

## 2014-12-06 ENCOUNTER — Emergency Department (HOSPITAL_COMMUNITY): Payer: Medicare Other

## 2014-12-06 DIAGNOSIS — Z9889 Other specified postprocedural states: Secondary | ICD-10-CM | POA: Insufficient documentation

## 2014-12-06 DIAGNOSIS — Z992 Dependence on renal dialysis: Secondary | ICD-10-CM | POA: Insufficient documentation

## 2014-12-06 DIAGNOSIS — Z8719 Personal history of other diseases of the digestive system: Secondary | ICD-10-CM | POA: Insufficient documentation

## 2014-12-06 DIAGNOSIS — I5032 Chronic diastolic (congestive) heart failure: Secondary | ICD-10-CM | POA: Insufficient documentation

## 2014-12-06 DIAGNOSIS — I120 Hypertensive chronic kidney disease with stage 5 chronic kidney disease or end stage renal disease: Secondary | ICD-10-CM | POA: Diagnosis not present

## 2014-12-06 DIAGNOSIS — R61 Generalized hyperhidrosis: Secondary | ICD-10-CM | POA: Diagnosis not present

## 2014-12-06 DIAGNOSIS — I319 Disease of pericardium, unspecified: Secondary | ICD-10-CM

## 2014-12-06 DIAGNOSIS — Z794 Long term (current) use of insulin: Secondary | ICD-10-CM | POA: Insufficient documentation

## 2014-12-06 DIAGNOSIS — R079 Chest pain, unspecified: Secondary | ICD-10-CM | POA: Diagnosis not present

## 2014-12-06 DIAGNOSIS — D649 Anemia, unspecified: Secondary | ICD-10-CM | POA: Diagnosis not present

## 2014-12-06 DIAGNOSIS — I2510 Atherosclerotic heart disease of native coronary artery without angina pectoris: Secondary | ICD-10-CM | POA: Diagnosis not present

## 2014-12-06 DIAGNOSIS — Z87891 Personal history of nicotine dependence: Secondary | ICD-10-CM | POA: Diagnosis not present

## 2014-12-06 DIAGNOSIS — E1140 Type 2 diabetes mellitus with diabetic neuropathy, unspecified: Secondary | ICD-10-CM | POA: Diagnosis not present

## 2014-12-06 DIAGNOSIS — Z9861 Coronary angioplasty status: Secondary | ICD-10-CM

## 2014-12-06 DIAGNOSIS — R0789 Other chest pain: Secondary | ICD-10-CM | POA: Diagnosis present

## 2014-12-06 DIAGNOSIS — Z7902 Long term (current) use of antithrombotics/antiplatelets: Secondary | ICD-10-CM | POA: Insufficient documentation

## 2014-12-06 DIAGNOSIS — N186 End stage renal disease: Secondary | ICD-10-CM | POA: Diagnosis not present

## 2014-12-06 DIAGNOSIS — Z7982 Long term (current) use of aspirin: Secondary | ICD-10-CM | POA: Insufficient documentation

## 2014-12-06 DIAGNOSIS — Z86010 Personal history of colonic polyps: Secondary | ICD-10-CM | POA: Diagnosis not present

## 2014-12-06 DIAGNOSIS — Z79899 Other long term (current) drug therapy: Secondary | ICD-10-CM | POA: Diagnosis not present

## 2014-12-06 DIAGNOSIS — F329 Major depressive disorder, single episode, unspecified: Secondary | ICD-10-CM | POA: Diagnosis not present

## 2014-12-06 DIAGNOSIS — R05 Cough: Secondary | ICD-10-CM | POA: Insufficient documentation

## 2014-12-06 LAB — BASIC METABOLIC PANEL
ANION GAP: 18 — AB (ref 5–15)
BUN: 45 mg/dL — ABNORMAL HIGH (ref 6–23)
CALCIUM: 9.8 mg/dL (ref 8.4–10.5)
CHLORIDE: 97 mmol/L (ref 96–112)
CO2: 25 mmol/L (ref 19–32)
CREATININE: 11.99 mg/dL — AB (ref 0.50–1.35)
GFR calc non Af Amer: 4 mL/min — ABNORMAL LOW (ref >90)
GFR, EST AFRICAN AMERICAN: 5 mL/min — AB (ref >90)
Glucose, Bld: 108 mg/dL — ABNORMAL HIGH (ref 70–99)
Potassium: 4.9 mmol/L (ref 3.5–5.1)
SODIUM: 140 mmol/L (ref 135–145)

## 2014-12-06 LAB — TROPONIN I

## 2014-12-06 LAB — CBC
HCT: 40 % (ref 39.0–52.0)
HEMOGLOBIN: 12.8 g/dL — AB (ref 13.0–17.0)
MCH: 27.2 pg (ref 26.0–34.0)
MCHC: 32 g/dL (ref 30.0–36.0)
MCV: 84.9 fL (ref 78.0–100.0)
Platelets: 204 10*3/uL (ref 150–400)
RBC: 4.71 MIL/uL (ref 4.22–5.81)
RDW: 16.5 % — AB (ref 11.5–15.5)
WBC: 5.6 10*3/uL (ref 4.0–10.5)

## 2014-12-06 LAB — BRAIN NATRIURETIC PEPTIDE: B Natriuretic Peptide: 86.9 pg/mL (ref 0.0–100.0)

## 2014-12-06 LAB — CBG MONITORING, ED
GLUCOSE-CAPILLARY: 91 mg/dL (ref 70–99)
GLUCOSE-CAPILLARY: 109 mg/dL — AB (ref 70–99)

## 2014-12-06 NOTE — ED Notes (Addendum)
Cp started at 0400, clamminess, cp described as sharp.  Dialysis patient.  Skin warm and dry with EMS.   Cp is intermittent.  324 asa and 0.4 ntg.  12 lead ok other than LVH per ems.  Nausea, no recent illness, cp not reproducible with palpation, ntg improved pain from 6/10 to 5/10.

## 2014-12-06 NOTE — Discharge Instructions (Signed)
If you were given medicines take as directed.  If you are on coumadin or contraceptives realize their levels and effectiveness is altered by many different medicines.  If you have any reaction (rash, tongues swelling, other) to the medicines stop taking and see a physician.   Please follow up as directed and return to the ER or see a physician for new or worsening symptoms.  Thank you. Filed Vitals:   12/06/14 1230 12/06/14 1245 12/06/14 1315 12/06/14 1330  BP: 186/90 186/88 175/88 162/71  Pulse: 92 98 95 103  Temp:      TempSrc:      Resp: 21 15 14 21   Height:      Weight:      SpO2: 97% 100% 97% 99%

## 2014-12-06 NOTE — ED Notes (Signed)
Pt made aware to return if symptoms worsen or if any life threatening symptoms occur.   

## 2014-12-06 NOTE — Consult Note (Signed)
Name: [name] [name] is a 59 y.o. male Admit date: 12/06/2014 Referring Physician:  ED, Reather Converse MD Primary Physician:  IM Primary Cardiologist:  Jenkins Rouge, M.D.  Reason for Consultation:  Chest pain and diaphoresis  ASSESSMENT: 1. Atypical chest pain. 2. History of coronary artery disease with prior LAD bare-metal, followed by DES stent, 2011 and 12 respectively. Recent catheterization,  2 weeks ago revealed widely patent coronaries. 3. End-stage renal disease on chronic dialysis 4. Chronic diastolic heart failure 5. Diabetes mellitus with nephropathy/ESRD 6. Essential hypertension  PLAN: 1. Serial troponins 2 and if negative, no further cardiac workup. 2. Consider 2-D Doppler echocardiogram to exclude pericardial effusion 3. Repeat EKG    HPI: 59 year old gentleman with 2 recent hospitalizations. 11/09/2014 he was admitted with shortness of breath. Febrile 14th 2016 he was admitted with chest pain. Coronary angiography was performed on February 16 and revealed widely patent coronaries.  He describes today's chest pain is a sharp right parasternal discomfort, located focally in the second and third intercostal space. There is no palpable tenderness. There is no dyspnea or radiation. He does have associated diaphoresis. The discomfort lasted a proximally 2 hours this morning but is now resolved. Diaphoresis continues. He is up in the room pacing and denies discomfort or dyspnea.  PMH:   Past Medical History  Diagnosis Date  . Hypertension   . ESRD on dialysis   . Diabetes mellitus with nephropathy   . Hemodialysis patient   . Hematochezia     a. 2014: colonscopy, which showed moderately-sized internal hemorrhoids, two 28mm polyps in transverse colon and ascending colon that were resected, five 2-22mm polyps in sigmoid colon, descending colon, transverse colon, and ascending colon that were resected. An upper endoscopy was performed and showed normal esophagus, stomach, and  duodenum.  . Hematuria     a. H/o hematuria 2014 with cystoscopy that was unrevealing for his source of hematuria. He underwent a kidney ultrasound on 10/14 that showed mildly echogenic and scarred kidneys compatible with medical renal disease, without hydronephrosis or renal calculi.  [name] Anemia   . Depression   . CAD (coronary artery disease)     a. per [facility] s/p BMS to mid LAD 12/2009 and DES to mid LAD 10/2010.  . Colon polyps   . Chronic diastolic CHF (congestive heart failure)     PSH:   Past Surgical History  Procedure Laterality Date  . Left heart catheterization with coronary angiogram N/A 11/23/2014    Procedure: LEFT HEART CATHETERIZATION WITH CORONARY ANGIOGRAM;  Surgeon: Troy Sine, MD;  Location: [facility] CATH LAB;  Service: Cardiovascular;  Laterality: N/A;   Allergies:  Enalapril Prior to Admit Meds:   (Not in a hospital admission) Fam HX:    Family History  Problem Relation Age of Onset  . Hypertension     Social HX:    History   Social History  . Marital Status: Unknown    Spouse Name: N/A  . Number of Children: N/A  . Years of Education: N/A   Occupational History  . Not on file.   Social History Main Topics  . Smoking status: Former Smoker    Quit date: 12/06/2010  . Smokeless tobacco: Never Used  . Alcohol Use: No  . Drug Use: No  . Sexual Activity: Not on file   Other Topics Concern  . Not on file   Social History Narrative     Review of Systems: No palpitations. He has not had syncope. He feels anxious.  He feels clammy and diaphoretic. Otherwise no complaint. Chest discomfort is similar to that which was evaluated 2 weeks ago.  Physical Exam: Blood pressure 177/91, pulse 95, temperature 98 F (36.7 C), temperature source Oral, resp. rate 17, height 6' (1.829 m), weight 205 lb (92.987 kg), SpO2 100 %. Weight change:    Examined while standing. Neck veins are flat. HEENT exam is unremarkable. Chest is clear anteriorly and  posteriorly. Cardiac exam reveals no rub, click, murmur, or gallop. Extremities reveal no edema. 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  Abdomen is soft. Bowel sounds are normal. Neurological exam is normal.  Labs: Lab Results  Component Value Date   WBC 5.6 12/06/2014   HGB 12.8* 12/06/2014   HCT 40.0 12/06/2014   MCV 84.9 12/06/2014   PLT 204 12/06/2014    Recent Labs Lab 12/04/14 1934 12/06/14 0845  NA 139 140  K 4.6 4.9  CL 94* 97  CO2 32 25  BUN 19 45*  CREATININE 7.81* 11.99*  CALCIUM 9.6 9.8  PROT 8.9*  --   BILITOT 0.3  --   ALKPHOS 62  --   ALT 10  --   AST 14  --   GLUCOSE 92 108*   No results found for: PTT Lab Results  Component Value Date   INR 1.05 11/23/2014   INR 1.09 04/27/2014   Lab Results  Component Value Date   TROPONINI <0.03 12/06/2014     Lab Results  Component Value Date   CHOL 97 11/09/2014   Lab Results  Component Value Date   HDL 23* 11/09/2014   Lab Results  Component Value Date   LDLCALC 57 11/09/2014   Lab Results  Component Value Date   TRIG 84 11/09/2014   Lab Results  Component Value Date   CHOLHDL 4.2 11/09/2014   No results found for: LDLDIRECT    Radiology:  Dg Chest Port 1 View  12/06/2014   CLINICAL DATA:  Chest pain and nausea; mildly short of breath  EXAM: PORTABLE CHEST - 1 VIEW  COMPARISON:  December 01, 2014  FINDINGS: Lungs are clear. Heart size and pulmonary vascularity are normal. No adenopathy. No pneumothorax. No bone lesions.  IMPRESSION: No edema or consolidation.   Electronically Signed   By: Lowella Grip III M.D.   On: 12/06/2014 08:36    EKG:  At 819 AM, reveals normal sinus rhythm with J-point elevation inferiorly and precordially. No reciprocal changes noted.    Sinclair Grooms 12/06/2014 10:42 AM

## 2014-12-06 NOTE — Progress Notes (Signed)
  Echocardiogram 2D Echocardiogram has been performed.  [name] [name] 12/06/2014, 12:22 PM

## 2014-12-06 NOTE — ED Notes (Signed)
Per echo lab, echo has been done but not yet read by cardiologist. I have paged cardiology to find out delay.

## 2014-12-06 NOTE — ED Provider Notes (Signed)
Cardiology evaluated in ER and with recent unremarkable cardiac cath they recommended outpatient follow-up. CSN: [id number]     Arrival date & time 12/06/14  [id number] History   First MD Initiated Contact with Patient 12/06/14 0815     Chief Complaint  Patient presents with  . Chest Pain     (Consider location/radiation/quality/duration/timing/severity/associated sxs/prior Treatment) HPI Comments: 59 year old male with end-stage renal disease dialysis Tuesday Thursday Saturday no missed days or issues recently, high blood pressure, CAD, diabetes no current cardiologist presents after sharp anterior chest pain no significant radiation and diaphoresis since 5 AM today. Similar to previous however more significant and lasting longer than last visit. Patient had a stress test about a year ago. Patient had aspirin on route. Currently mild symptoms. Nothing surgically has worsened his symptoms.Patient denies blood clot history, active cancer, recent major trauma or surgery, unilateral leg swelling/ pain, recent long travel, hemoptysis or oral contraceptives.   Patient is a 59 y.o. male presenting with chest pain. The history is provided by the patient.  Chest Pain Associated symptoms: cough and diaphoresis   Associated symptoms: no abdominal pain, no back pain, no fever, no headache, no shortness of breath and not vomiting     Past Medical History  Diagnosis Date  . Hypertension   . ESRD on dialysis   . Diabetes mellitus with nephropathy   . Hemodialysis patient   . Hematochezia     a. 2014: colonscopy, which showed moderately-sized internal hemorrhoids, two 87mm polyps in transverse colon and ascending colon that were resected, five 2-76mm polyps in sigmoid colon, descending colon, transverse colon, and ascending colon that were resected. An upper endoscopy was performed and showed normal esophagus, stomach, and duodenum.  . Hematuria     a. H/o hematuria 2014 with cystoscopy that was unrevealing for  his source of hematuria. He underwent a kidney ultrasound on 10/14 that showed mildly echogenic and scarred kidneys compatible with medical renal disease, without hydronephrosis or renal calculi.  [name] Anemia   . Depression   . CAD (coronary artery disease)     a. per [facility] s/p BMS to mid LAD 12/2009 and DES to mid LAD 10/2010.  . Colon polyps   . Chronic diastolic CHF (congestive heart failure)    Past Surgical History  Procedure Laterality Date  . Left heart catheterization with coronary angiogram N/A 11/23/2014    Procedure: LEFT HEART CATHETERIZATION WITH CORONARY ANGIOGRAM;  Surgeon: Troy Sine, MD;  Location: [facility] CATH LAB;  Service: Cardiovascular;  Laterality: N/A;   Family History  Problem Relation Age of Onset  . Hypertension     History  Substance Use Topics  . Smoking status: Former Smoker    Quit date: 12/06/2010  . Smokeless tobacco: Never Used  . Alcohol Use: No    Review of Systems  Constitutional: Positive for diaphoresis. Negative for fever and chills.  HENT: Negative for congestion.   Eyes: Negative for visual disturbance.  Respiratory: Positive for cough. Negative for shortness of breath.   Cardiovascular: Positive for chest pain. Negative for leg swelling.  Gastrointestinal: Negative for vomiting and abdominal pain.  Genitourinary: Negative for dysuria and flank pain.  Musculoskeletal: Negative for back pain, neck pain and neck stiffness.  Skin: Negative for rash.  Neurological: Negative for light-headedness and headaches.      Allergies  Enalapril  Home Medications   Prior to Admission medications   Medication Sig Start Date End Date Taking? Authorizing Provider  acetaminophen (TYLENOL) 500 MG tablet Take  500 mg by mouth every 6 (six) hours as needed for headache. Takes on dialysis Tuesday, Thursday, and Saturday and as needed   Yes Historical Provider, MD  amitriptyline (ELAVIL) 100 MG tablet Take 100 mg by mouth at bedtime.   Yes Historical  Provider, MD  aspirin EC 81 MG tablet Take 81 mg by mouth daily.   Yes Historical Provider, MD  atorvastatin (LIPITOR) 40 MG tablet Take 40 mg by mouth daily.   Yes Historical Provider, MD  calcium acetate (PHOSLO) 667 MG capsule Take 2 capsules (1,334 mg total) by mouth 3 (three) times daily with meals. 11/10/14  Yes Albin Felling, MD  carvedilol (COREG) 25 MG tablet Take 50 mg by mouth 2 (two) times daily with a meal.   Yes Historical Provider, MD  cinacalcet (SENSIPAR) 30 MG tablet Take 30 mg by mouth at bedtime.    Yes Historical Provider, MD  clopidogrel (PLAVIX) 75 MG tablet Take 1 tablet (75 mg total) by mouth daily. 11/24/14  Yes Marjan Rabbani, MD  insulin aspart (NOVOLOG) 100 UNIT/ML injection Inject 15-20 Units into the skin 3 (three) times daily as needed for high blood sugar (CBG >150).    Yes Historical Provider, MD  insulin glargine (LANTUS) 100 UNIT/ML injection Inject 10 Units into the skin at bedtime.   Yes Historical Provider, MD  Ipratropium-Albuterol (COMBIVENT RESPIMAT) 20-100 MCG/ACT AERS respimat Inhale 2 puffs into the lungs every 6 (six) hours as needed for wheezing.   Yes Historical Provider, MD  meclizine (ANTIVERT) 25 MG tablet Take 25 mg by mouth 3 (three) times daily as needed for dizziness.   Yes Historical Provider, MD  Multiple Vitamin (MULTIVITAMIN WITH MINERALS) TABS tablet Take 1 tablet by mouth daily.   Yes Historical Provider, MD  nitroGLYCERIN (NITROSTAT) 0.4 MG SL tablet Place 0.4 mg under the tongue every 5 (five) minutes as needed for chest pain.   Yes Historical Provider, MD  ondansetron (ZOFRAN) 4 MG tablet Take 1 tablet (4 mg total) by mouth every 6 (six) hours. Patient taking differently: Take 4 mg by mouth every 6 (six) hours as needed for vomiting.  12/02/14  Yes Britt Bottom, NP  pregabalin (LYRICA) 100 MG capsule Take 100 mg by mouth 3 (three) times daily.   Yes Historical Provider, MD  sevelamer carbonate (RENVELA) 800 MG tablet Take 1,600-2,400 mg  by mouth 3 (three) times daily with meals. Take 3 capsules (2400 mg) with breakfast and supper, take 2 capsules (1600 mg) with lunch, 3 tablets with dinner   Yes Historical Provider, MD  venlafaxine (EFFEXOR) 37.5 MG tablet Take 37.5 mg by mouth 2 (two) times daily.   Yes Historical Provider, MD  pantoprazole (PROTONIX) 40 MG tablet Take 1 tablet (40 mg total) by mouth daily. Patient not taking: Reported on 12/04/2014 11/24/14   Juluis Mire, MD  ranolazine (RANEXA) 500 MG 12 hr tablet Take 1 tablet (500 mg total) by mouth 2 (two) times daily. Patient not taking: Reported on 12/04/2014 11/24/14   Juluis Mire, MD   BP 162/71 mmHg  Pulse 103  Temp(Src) 98 F (36.7 C) (Oral)  Resp 21  Ht 6' (1.829 m)  Wt 205 lb (92.987 kg)  BMI 27.80 kg/m2  SpO2 99% Physical Exam  Constitutional: He is oriented to person, place, and time. He appears well-developed and well-nourished.  HENT:  Head: Normocephalic and atraumatic.  Eyes: Conjunctivae are normal. Right eye exhibits no discharge. Left eye exhibits no discharge.  Neck: Normal range of motion. Neck supple.  No tracheal deviation present.  Cardiovascular: Normal rate and regular rhythm.   Pulmonary/Chest: Effort normal and breath sounds normal.  Abdominal: Soft. He exhibits no distension. There is no tenderness. There is no guarding.  Musculoskeletal: He exhibits no edema or tenderness.  Neurological: He is alert and oriented to person, place, and time.  Skin: Skin is warm. No rash noted.  Psychiatric: He has a normal mood and affect.  Nursing note and vitals reviewed.   ED Course  Procedures (including critical care time) Labs Review Labs Reviewed  CBC - Abnormal; Notable for the following:    Hemoglobin 12.8 (*)    RDW 16.5 (*)    All other components within normal limits  BASIC METABOLIC PANEL - Abnormal; Notable for the following:    Glucose, Bld 108 (*)    BUN 45 (*)    Creatinine, Ser 11.99 (*)    GFR calc non Af Amer 4 (*)     GFR calc Af Amer 5 (*)    Anion gap 18 (*)    All other components within normal limits  CBG MONITORING, ED - Abnormal; Notable for the following:    Glucose-Capillary 109 (*)    All other components within normal limits  BRAIN NATRIURETIC PEPTIDE  TROPONIN I  TROPONIN I  CBG MONITORING, ED    Imaging Review Dg Chest Port 1 View  12/06/2014   CLINICAL DATA:  Chest pain and nausea; mildly short of breath  EXAM: PORTABLE CHEST - 1 VIEW  COMPARISON:  December 01, 2014  FINDINGS: Lungs are clear. Heart size and pulmonary vascularity are normal. No adenopathy. No pneumothorax. No bone lesions.  IMPRESSION: No edema or consolidation.   Electronically Signed   By: Lowella Grip III M.D.   On: 12/06/2014 08:36     EKG Interpretation   Date/Time:  Monday December 06 2014 08:19:12 EST Ventricular Rate:  96 PR Interval:  138 QRS Duration: 100 QT Interval:  383 QTC Calculation: 484 R Axis:   77 Text Interpretation:  Sinus rhythm Probable left atrial enlargement  Similar to previous Confirmed by Keylor Rands  MD, Jenness Stemler ([id number]) on 12/06/2014  8:30:14 AM      MDM   Final diagnoses:  Chest pain, unspecified chest pain type  HTN  Patient with known coronary artery disease presents after significant episode of chest pain and diaphoresis this morning, symptoms have improved and currently mild to moderate. With high risk history and concerning history of present illness cardiology consult placed.  Cardiology evaluated in the ER, with recent unremarkable cath recommended outpatient follow-up after negative troponin. Cardiology ordered echo in the ER prior to discharge.  Results and differential diagnosis were discussed with the patient/parent/guardian. Close follow up outpatient was discussed, comfortable with the plan.   Medications - No data to display  Filed Vitals:   12/06/14 1230 12/06/14 1245 12/06/14 1315 12/06/14 1330  BP: 186/90 186/88 175/88 162/71  Pulse: 92 98 95 103  Temp:       TempSrc:      Resp: 21 15 14 21   Height:      Weight:      SpO2: 97% 100% 97% 99%    Final diagnoses:  Chest pain, unspecified chest pain type       Mariea Clonts, MD 12/06/14 1526

## 2014-12-06 NOTE — ED Notes (Signed)
Dr. Marigene Ehlers is reading echos. Will write up report shortly.

## 2014-12-06 NOTE — ED Notes (Signed)
Pt undressed, in gown, on monitor, continuous pulse oximetry and blood pressure cuff 

## 2014-12-06 NOTE — ED Notes (Signed)
Tamala Julian, MD (Cardiology) speaking with pt at this time

## 2014-12-06 NOTE — ED Notes (Signed)
Pt was standing up and complained of having chest pain and feeling clammy. Pt is diabetic.  EKG repeated and informed Josefina Do.  Pt is alert and talking.  RN had just checked blood sugar and reported it was 91.

## 2014-12-12 ENCOUNTER — Inpatient Hospital Stay (HOSPITAL_COMMUNITY)
Admission: EM | Admit: 2014-12-12 | Discharge: 2014-12-15 | DRG: 391 | Disposition: A | Discharge status: Home / Self Care | Payer: Medicare Other | Attending: Internal Medicine | Admitting: Internal Medicine

## 2014-12-12 ENCOUNTER — Encounter (HOSPITAL_COMMUNITY): Payer: Self-pay | Admitting: *Deleted

## 2014-12-12 ENCOUNTER — Other Ambulatory Visit (HOSPITAL_COMMUNITY): Payer: Self-pay

## 2014-12-12 ENCOUNTER — Emergency Department (HOSPITAL_COMMUNITY): Payer: Medicare Other

## 2014-12-12 DIAGNOSIS — Z992 Dependence on renal dialysis: Secondary | ICD-10-CM

## 2014-12-12 DIAGNOSIS — Z794 Long term (current) use of insulin: Secondary | ICD-10-CM

## 2014-12-12 DIAGNOSIS — R1110 Vomiting, unspecified: Secondary | ICD-10-CM

## 2014-12-12 DIAGNOSIS — K2970 Gastritis, unspecified, without bleeding: Principal | ICD-10-CM | POA: Diagnosis present

## 2014-12-12 DIAGNOSIS — Z87891 Personal history of nicotine dependence: Secondary | ICD-10-CM

## 2014-12-12 DIAGNOSIS — R112 Nausea with vomiting, unspecified: Secondary | ICD-10-CM | POA: Diagnosis present

## 2014-12-12 DIAGNOSIS — D649 Anemia, unspecified: Secondary | ICD-10-CM | POA: Diagnosis present

## 2014-12-12 DIAGNOSIS — Z955 Presence of coronary angioplasty implant and graft: Secondary | ICD-10-CM

## 2014-12-12 DIAGNOSIS — N186 End stage renal disease: Secondary | ICD-10-CM | POA: Diagnosis present

## 2014-12-12 DIAGNOSIS — I5032 Chronic diastolic (congestive) heart failure: Secondary | ICD-10-CM | POA: Diagnosis present

## 2014-12-12 DIAGNOSIS — I5189 Other ill-defined heart diseases: Secondary | ICD-10-CM | POA: Diagnosis present

## 2014-12-12 DIAGNOSIS — I10 Essential (primary) hypertension: Secondary | ICD-10-CM | POA: Diagnosis present

## 2014-12-12 DIAGNOSIS — I120 Hypertensive chronic kidney disease with stage 5 chronic kidney disease or end stage renal disease: Secondary | ICD-10-CM | POA: Diagnosis present

## 2014-12-12 DIAGNOSIS — E1121 Type 2 diabetes mellitus with diabetic nephropathy: Secondary | ICD-10-CM | POA: Diagnosis present

## 2014-12-12 DIAGNOSIS — N2581 Secondary hyperparathyroidism of renal origin: Secondary | ICD-10-CM | POA: Diagnosis present

## 2014-12-12 DIAGNOSIS — Z7982 Long term (current) use of aspirin: Secondary | ICD-10-CM

## 2014-12-12 DIAGNOSIS — R0602 Shortness of breath: Secondary | ICD-10-CM

## 2014-12-12 DIAGNOSIS — R0789 Other chest pain: Secondary | ICD-10-CM | POA: Diagnosis present

## 2014-12-12 DIAGNOSIS — Z8639 Personal history of other endocrine, nutritional and metabolic disease: Secondary | ICD-10-CM

## 2014-12-12 DIAGNOSIS — R079 Chest pain, unspecified: Secondary | ICD-10-CM

## 2014-12-12 DIAGNOSIS — R0902 Hypoxemia: Secondary | ICD-10-CM

## 2014-12-12 DIAGNOSIS — I2510 Atherosclerotic heart disease of native coronary artery without angina pectoris: Secondary | ICD-10-CM | POA: Diagnosis present

## 2014-12-12 DIAGNOSIS — Z7902 Long term (current) use of antithrombotics/antiplatelets: Secondary | ICD-10-CM

## 2014-12-12 LAB — CBC
HCT: 33.4 % — ABNORMAL LOW (ref 39.0–52.0)
Hemoglobin: 10.5 g/dL — ABNORMAL LOW (ref 13.0–17.0)
MCH: 27 pg (ref 26.0–34.0)
MCHC: 31.4 g/dL (ref 30.0–36.0)
MCV: 85.9 fL (ref 78.0–100.0)
Platelets: 250 10*3/uL (ref 150–400)
RBC: 3.89 MIL/uL — AB (ref 4.22–5.81)
RDW: 16.6 % — ABNORMAL HIGH (ref 11.5–15.5)
WBC: 6.7 10*3/uL (ref 4.0–10.5)

## 2014-12-12 LAB — BASIC METABOLIC PANEL
ANION GAP: 12 (ref 5–15)
BUN: 42 mg/dL — ABNORMAL HIGH (ref 6–23)
CALCIUM: 8.9 mg/dL (ref 8.4–10.5)
CHLORIDE: 101 mmol/L (ref 96–112)
CO2: 27 mmol/L (ref 19–32)
Creatinine, Ser: 10.86 mg/dL — ABNORMAL HIGH (ref 0.50–1.35)
GFR calc Af Amer: 5 mL/min — ABNORMAL LOW (ref >90)
GFR, EST NON AFRICAN AMERICAN: 5 mL/min — AB (ref >90)
Glucose, Bld: 72 mg/dL (ref 70–99)
POTASSIUM: 4.4 mmol/L (ref 3.5–5.1)
Sodium: 140 mmol/L (ref 135–145)

## 2014-12-12 LAB — TROPONIN I: Troponin I: 0.04 ng/mL — ABNORMAL HIGH (ref <0.031)

## 2014-12-12 NOTE — ED Notes (Signed)
attempted to draw labs and was unsuccessful.  I called main lab

## 2014-12-12 NOTE — ED Notes (Signed)
Bed: [facility] Expected date:  Expected time:  Means of arrival:  Comments: ems 

## 2014-12-12 NOTE — ED Notes (Signed)
Pt arrived via EMS, pt given 2 SL NTG en route and 324 ASA.

## 2014-12-12 NOTE — ED Notes (Signed)
Informed pt that a speciemen is needed

## 2014-12-12 NOTE — ED Notes (Signed)
Pt states that he began having chest pain around 2pm with shortness of breath; pt states that the chest pain has mostly resolved but still c/o shortness of breath; pt also c/o N/V for months; pt reports 70lb weight loss over the last few months due to N/V

## 2014-12-12 NOTE — ED Provider Notes (Signed)
CSN: [id number]     Arrival date & time 12/12/14  1955 History   First MD Initiated Contact with Patient 12/12/14 2146     Chief Complaint  Patient presents with  . Chest Pain     (Consider location/radiation/quality/duration/timing/severity/associated sxs/prior Treatment) The history is provided by the patient. No language interpreter was used.  [name] [name] is a 59 y/o M with PMHx of HTN, ESRD on dialysis Tuesday/Thursday/Saturday, depression, CAD with 2 stents placed with recent unremarkable cardiac cath in 11/2014, CHF presenting to the ED with chest pain that started this afternoon at approximately 2:00PM this afternoon. Patient was brought in by EMS, en route aspirin and 2 sublingual nitroglycerin were administered. Patient reported that the pain is localized to the center and left side of his chest described as a pressure that resolved approximately 1 hour ago. Patient reported that with the chest pain there has been diaphoresis, shortness of breath, and dizziness. Patient reported that he has been feeling nauseous with inability to keep food or fluids down for the past couple of weeks - stated that he has not been able to keep down any of his medication. Reported that he has lost "70 pounds." Denied difficulty breathing, fainting, abdominal pain, melena, hematochezia, dysuria, travels, leg swelling. PCP none Cardiologists none Nephrologist in [place]   Past Medical History  Diagnosis Date  . Hypertension   . ESRD on dialysis   . Diabetes mellitus with nephropathy   . Hemodialysis patient   . Hematochezia     a. 2014: colonscopy, which showed moderately-sized internal hemorrhoids, two 42mm polyps in transverse colon and ascending colon that were resected, five 2-61mm polyps in sigmoid colon, descending colon, transverse colon, and ascending colon that were resected. An upper endoscopy was performed and showed normal esophagus, stomach, and duodenum.  . Hematuria     a. H/o hematuria  2014 with cystoscopy that was unrevealing for his source of hematuria. He underwent a kidney ultrasound on 10/14 that showed mildly echogenic and scarred kidneys compatible with medical renal disease, without hydronephrosis or renal calculi.  [name] Anemia   . Depression   . CAD (coronary artery disease)     a. per [facility] s/p BMS to mid LAD 12/2009 and DES to mid LAD 10/2010.  . Colon polyps   . Chronic diastolic CHF (congestive heart failure)    Past Surgical History  Procedure Laterality Date  . Left heart catheterization with coronary angiogram N/A 11/23/2014    Procedure: LEFT HEART CATHETERIZATION WITH CORONARY ANGIOGRAM;  Surgeon: Troy Sine, MD;  Location: [facility] CATH LAB;  Service: Cardiovascular;  Laterality: N/A;   Family History  Problem Relation Age of Onset  . Hypertension    . Bone cancer Mother   . Anuerysm Father   . Diabetes type II Daughter    History  Substance Use Topics  . Smoking status: Former Smoker    Quit date: 12/06/2010  . Smokeless tobacco: Never Used  . Alcohol Use: No    Review of Systems  Constitutional: Positive for diaphoresis. Negative for fever and chills.  Respiratory: Positive for shortness of breath. Negative for chest tightness.   Cardiovascular: Positive for chest pain.  Gastrointestinal: Positive for nausea and vomiting. Negative for abdominal pain, diarrhea, constipation, blood in stool and anal bleeding.  Musculoskeletal: Negative for back pain, neck pain and neck stiffness.  Neurological: Negative for headaches.      Allergies  Enalapril  Home Medications   Prior to Admission medications   Medication  Sig Start Date End Date Taking? Authorizing Provider  acetaminophen (TYLENOL) 500 MG tablet Take 500 mg by mouth every 6 (six) hours as needed for headache.    Yes Historical Provider, MD  amitriptyline (ELAVIL) 100 MG tablet Take 100 mg by mouth at bedtime.   Yes Historical Provider, MD  amLODipine (NORVASC) 10 MG tablet Take  10 mg by mouth daily.   Yes Historical Provider, MD  aspirin EC 81 MG tablet Take 81 mg by mouth daily.   Yes Historical Provider, MD  atorvastatin (LIPITOR) 20 MG tablet Take 20 mg by mouth daily.   Yes Historical Provider, MD  calcium acetate (PHOSLO) 667 MG capsule Take 2 capsules (1,334 mg total) by mouth 3 (three) times daily with meals. 11/10/14  Yes Albin Felling, MD  carvedilol (COREG) 25 MG tablet Take 50 mg by mouth 2 (two) times daily.    Yes Historical Provider, MD  cinacalcet (SENSIPAR) 90 MG tablet Take 90 mg by mouth daily.   Yes Historical Provider, MD  clopidogrel (PLAVIX) 75 MG tablet Take 1 tablet (75 mg total) by mouth daily. 11/24/14  Yes Marjan Rabbani, MD  finasteride (PROSCAR) 5 MG tablet Take 5 mg by mouth daily.   Yes Historical Provider, MD  insulin aspart (NOVOLOG) 100 UNIT/ML injection Inject 15-20 Units into the skin 3 (three) times daily as needed for high blood sugar (CBG >150).    Yes Historical Provider, MD  insulin glargine (LANTUS) 100 UNIT/ML injection Inject 10 Units into the skin at bedtime.   Yes Historical Provider, MD  Ipratropium-Albuterol (COMBIVENT RESPIMAT) 20-100 MCG/ACT AERS respimat Inhale 2 puffs into the lungs every 6 (six) hours as needed for wheezing.   Yes Historical Provider, MD  meclizine (ANTIVERT) 25 MG tablet Take 25 mg by mouth 3 (three) times daily as needed for dizziness.   Yes Historical Provider, MD  Multiple Vitamin (MULTIVITAMIN WITH MINERALS) TABS tablet Take 1 tablet by mouth daily.   Yes Historical Provider, MD  nitroGLYCERIN (NITROSTAT) 0.4 MG SL tablet Place 0.4 mg under the tongue every 5 (five) minutes as needed for chest pain.   Yes Historical Provider, MD  ondansetron (ZOFRAN) 4 MG tablet Take 1 tablet (4 mg total) by mouth every 6 (six) hours. Patient taking differently: Take 4 mg by mouth every 6 (six) hours as needed for nausea or vomiting.  12/02/14  Yes Britt Bottom, NP  pregabalin (LYRICA) 100 MG capsule Take 100 mg by  mouth 3 (three) times daily.   Yes Historical Provider, MD  ranolazine (RANEXA) 500 MG 12 hr tablet Take 1 tablet (500 mg total) by mouth 2 (two) times daily. 11/24/14  Yes Juluis Mire, MD  sevelamer carbonate (RENVELA) 800 MG tablet Take 1,600-2,400 mg by mouth 3 (three) times daily with meals. Pt takes 3 capsules with breakfast, 2 capsules with lunch, and 3 capsules with dinner.   Yes Historical Provider, MD  venlafaxine (EFFEXOR) 37.5 MG tablet Take 37.5 mg by mouth 2 (two) times daily.   Yes Historical Provider, MD  pantoprazole (PROTONIX) 40 MG tablet Take 1 tablet (40 mg total) by mouth daily. Patient not taking: Reported on 12/04/2014 11/24/14   Juluis Mire, MD   BP 171/92 mmHg  Pulse 90  Temp(Src) 97.9 F (36.6 C) (Oral)  Resp 22  SpO2 95% Physical Exam  Constitutional: He is oriented to person, place, and time. He appears well-developed and well-nourished. No distress.  HENT:  Head: Normocephalic and atraumatic.  Mouth/Throat: Oropharynx is clear and moist.  No oropharyngeal exudate.  Eyes: Conjunctivae and EOM are normal. Pupils are equal, round, and reactive to light. Right eye exhibits no discharge. Left eye exhibits no discharge.  Neck: Normal range of motion. Neck supple. No tracheal deviation present.  Cardiovascular: Normal rate, regular rhythm and normal heart sounds.  Exam reveals no friction rub.   No murmur heard. Cap refill < 3 seconds Negative swelling or pitting edema noted to the lower extremities bilaterally   Pulmonary/Chest: Effort normal and breath sounds normal. No respiratory distress. He has no wheezes. He has no rales. He exhibits no tenderness.  Abdominal: Soft. Bowel sounds are normal. He exhibits no distension. There is no tenderness. There is no rebound, no guarding and no CVA tenderness.  Musculoskeletal: Normal range of motion.  Lymphadenopathy:    He has no cervical adenopathy.  Neurological: He is alert and oriented to person, place, and time. No  cranial nerve deficit. He exhibits normal muscle tone. Coordination normal.  Equal grip strength bilaterally   Skin: Skin is warm and dry. No rash noted. He is not diaphoretic. No erythema.  Psychiatric: He has a normal mood and affect. His behavior is normal. Thought content normal.  Nursing note and vitals reviewed.   ED Course  Procedures (including critical care time)   Results for orders placed or performed during the hospital encounter of 12/12/14  CBC  Result Value Ref Range   WBC 6.7 4.0 - 10.5 K/uL   RBC 3.89 (L) 4.22 - 5.81 MIL/uL   Hemoglobin 10.5 (L) 13.0 - 17.0 g/dL   HCT 33.4 (L) 39.0 - 52.0 %   MCV 85.9 78.0 - 100.0 fL   MCH 27.0 26.0 - 34.0 pg   MCHC 31.4 30.0 - 36.0 g/dL   RDW 16.6 (H) 11.5 - 15.5 %   Platelets 250 150 - 400 K/uL  Basic metabolic panel  Result Value Ref Range   Sodium 140 135 - 145 mmol/L   Potassium 4.4 3.5 - 5.1 mmol/L   Chloride 101 96 - 112 mmol/L   CO2 27 19 - 32 mmol/L   Glucose, Bld 72 70 - 99 mg/dL   BUN 42 (H) 6 - 23 mg/dL   Creatinine, Ser 10.86 (H) 0.50 - 1.35 mg/dL   Calcium 8.9 8.4 - 10.5 mg/dL   GFR calc non Af Amer 5 (L) >90 mL/min   GFR calc Af Amer 5 (L) >90 mL/min   Anion gap 12 5 - 15  BNP (order ONLY if patient complains of dyspnea/SOB AND you have documented it for THIS visit)  Result Value Ref Range   B Natriuretic Peptide 816.3 (H) 0.0 - 100.0 pg/mL  Troponin I  Result Value Ref Range   Troponin I 0.04 (H) <0.031 ng/mL    Labs Review Labs Reviewed  CBC - Abnormal; Notable for the following:    RBC 3.89 (*)    Hemoglobin 10.5 (*)    HCT 33.4 (*)    RDW 16.6 (*)    All other components within normal limits  BASIC METABOLIC PANEL - Abnormal; Notable for the following:    BUN 42 (*)    Creatinine, Ser 10.86 (*)    GFR calc non Af Amer 5 (*)    GFR calc Af Amer 5 (*)    All other components within normal limits  BRAIN NATRIURETIC PEPTIDE - Abnormal; Notable for the following:    B Natriuretic Peptide 816.3  (*)    All other components within normal limits  TROPONIN I - Abnormal; Notable for the following:    Troponin I 0.04 (*)    All other components within normal limits    Imaging Review Dg Chest 2 View  12/12/2014   CLINICAL DATA:  Chest pain  EXAM: CHEST  2 VIEW  COMPARISON:  12/06/2014  FINDINGS: Chronic elevation left hemidiaphragm is unchanged. Negative for heart failure or pneumonia. Heart size and vascularity are normal. Negative for mass lesion. Left coronary stent noted.  IMPRESSION: No active cardiopulmonary disease.   Electronically Signed   By: Franchot Gallo M.D.   On: 12/12/2014 20:41   Dg Abd 1 View  12/13/2014   CLINICAL DATA:  Nausea, vomiting common diarrhea off and on for 1 month. Chest pain.  EXAM: ABDOMEN - 1 VIEW  COMPARISON:  None.  FINDINGS: Diffuse gas and stool throughout the colon. Gas within nondistended mid abdominal small bowel. No small or large bowel distention. No radiopaque stones. Vascular calcifications in the pelvis. Visualized bones appear intact except for degenerative change.  IMPRESSION: Normal nonobstructive bowel gas pattern.   Electronically Signed   By: Lucienne Capers M.D.   On: 12/13/2014 00:10     EKG Interpretation None       12:49 AM This provider spoke with Dr. Roel Cluck, [facility]. Discussed case, labs, imaging, vitals, ED course in great detail. Reported that patient may need to be transferred to [facility]. Admitting physician to see patient.   3:10 AM This provider spoke with Dr. Alcario Drought at [facility] reported that he is the accepting physician.   MDM   Final diagnoses:  Chest pain, unspecified chest pain type  ESRD (end stage renal disease) on dialysis  Intractable vomiting with nausea, vomiting of unspecified type    Medications - No data to display  Filed Vitals:   12/12/14 2008 12/12/14 2218 12/13/14 0101 12/13/14 0216  BP: 173/84 183/106 189/92 171/92  Pulse: 86 92 91 90  Temp: 98.1 F (36.7 C)   97.9 F (36.6 C)   TempSrc: Oral   Oral  Resp: 23 21 16 22   SpO2: 97% 96% 98% 95%   This provider reviewed patient's chart. Patient had a cardiac catheterization performed on 11/23/2014 that identified a patent LAD stent in the proximal to midsegment with 20% narrowing. EKG noted sinus rhythm with a heart rate of 84 bpm - reviewed by attending. Troponin mildly elevated at 0.04. BNP 816.3. CBC negative elevated leukocytosis. BMP noted elevated BUN of 42, creatinine of 10.86-patient has history of end-stage or an disease on dialysis. Chest x-ray no active card or pulmonary disease noted. Plain film normal non-obstructive bowel gas pattern identified. Patient presenting to the ED with chest pain that started this afternoon - patient has a history of stent placement, ERSD, and HTN. Chest pain has resolved after administering medications en route in the EMS. Patient has a strong history - patient to be admitted. Patient reported that he has been unable to keep any of his medications down for the past couple of days - blood pressure mildly elevated, but patient denied symptoms. Discussed case with Dr. Roswell Nickel - patient to be transferred to [facility] for cardiac monitoring. Discussed plan for admission with patient who agrees to plan of care. Patient stable for transfer.   Jamse Mead, PA-C 12/13/14 0230  Jamse Mead, PA-C 12/13/14 [id number]  Pamella Pert, MD 12/14/14 [phone]

## 2014-12-13 ENCOUNTER — Encounter (HOSPITAL_COMMUNITY): Payer: Self-pay | Admitting: Internal Medicine

## 2014-12-13 ENCOUNTER — Inpatient Hospital Stay (HOSPITAL_COMMUNITY): Payer: Medicare Other

## 2014-12-13 DIAGNOSIS — R7989 Other specified abnormal findings of blood chemistry: Secondary | ICD-10-CM

## 2014-12-13 DIAGNOSIS — Z7982 Long term (current) use of aspirin: Secondary | ICD-10-CM | POA: Diagnosis not present

## 2014-12-13 DIAGNOSIS — Z794 Long term (current) use of insulin: Secondary | ICD-10-CM

## 2014-12-13 DIAGNOSIS — Z7902 Long term (current) use of antithrombotics/antiplatelets: Secondary | ICD-10-CM | POA: Diagnosis not present

## 2014-12-13 DIAGNOSIS — R0789 Other chest pain: Secondary | ICD-10-CM

## 2014-12-13 DIAGNOSIS — I2510 Atherosclerotic heart disease of native coronary artery without angina pectoris: Secondary | ICD-10-CM | POA: Diagnosis present

## 2014-12-13 DIAGNOSIS — E119 Type 2 diabetes mellitus without complications: Secondary | ICD-10-CM

## 2014-12-13 DIAGNOSIS — N2581 Secondary hyperparathyroidism of renal origin: Secondary | ICD-10-CM | POA: Diagnosis present

## 2014-12-13 DIAGNOSIS — E1121 Type 2 diabetes mellitus with diabetic nephropathy: Secondary | ICD-10-CM | POA: Diagnosis present

## 2014-12-13 DIAGNOSIS — I5189 Other ill-defined heart diseases: Secondary | ICD-10-CM | POA: Diagnosis present

## 2014-12-13 DIAGNOSIS — N186 End stage renal disease: Secondary | ICD-10-CM | POA: Diagnosis present

## 2014-12-13 DIAGNOSIS — Z87891 Personal history of nicotine dependence: Secondary | ICD-10-CM | POA: Diagnosis not present

## 2014-12-13 DIAGNOSIS — R079 Chest pain, unspecified: Secondary | ICD-10-CM | POA: Diagnosis present

## 2014-12-13 DIAGNOSIS — I5032 Chronic diastolic (congestive) heart failure: Secondary | ICD-10-CM | POA: Diagnosis present

## 2014-12-13 DIAGNOSIS — I120 Hypertensive chronic kidney disease with stage 5 chronic kidney disease or end stage renal disease: Secondary | ICD-10-CM | POA: Diagnosis present

## 2014-12-13 DIAGNOSIS — R112 Nausea with vomiting, unspecified: Secondary | ICD-10-CM | POA: Diagnosis present

## 2014-12-13 DIAGNOSIS — K2970 Gastritis, unspecified, without bleeding: Secondary | ICD-10-CM | POA: Diagnosis present

## 2014-12-13 DIAGNOSIS — D649 Anemia, unspecified: Secondary | ICD-10-CM | POA: Diagnosis present

## 2014-12-13 DIAGNOSIS — Z992 Dependence on renal dialysis: Secondary | ICD-10-CM | POA: Diagnosis not present

## 2014-12-13 DIAGNOSIS — Z955 Presence of coronary angioplasty implant and graft: Secondary | ICD-10-CM | POA: Diagnosis not present

## 2014-12-13 LAB — CBC
HCT: 31.4 % — ABNORMAL LOW (ref 39.0–52.0)
HEMOGLOBIN: 10 g/dL — AB (ref 13.0–17.0)
MCH: 26.9 pg (ref 26.0–34.0)
MCHC: 31.8 g/dL (ref 30.0–36.0)
MCV: 84.4 fL (ref 78.0–100.0)
Platelets: 202 10*3/uL (ref 150–400)
RBC: 3.72 MIL/uL — ABNORMAL LOW (ref 4.22–5.81)
RDW: 16.6 % — ABNORMAL HIGH (ref 11.5–15.5)
WBC: 5.2 10*3/uL (ref 4.0–10.5)

## 2014-12-13 LAB — COMPREHENSIVE METABOLIC PANEL
ALK PHOS: 43 U/L (ref 39–117)
ALT: 8 U/L (ref 0–53)
AST: 11 U/L (ref 0–37)
Albumin: 3.5 g/dL (ref 3.5–5.2)
Anion gap: 16 — ABNORMAL HIGH (ref 5–15)
BILIRUBIN TOTAL: 0.8 mg/dL (ref 0.3–1.2)
BUN: 42 mg/dL — ABNORMAL HIGH (ref 6–23)
CO2: 22 mmol/L (ref 19–32)
Calcium: 8.8 mg/dL (ref 8.4–10.5)
Chloride: 100 mmol/L (ref 96–112)
Creatinine, Ser: 11.65 mg/dL — ABNORMAL HIGH (ref 0.50–1.35)
GFR, EST AFRICAN AMERICAN: 5 mL/min — AB (ref >90)
GFR, EST NON AFRICAN AMERICAN: 4 mL/min — AB (ref >90)
GLUCOSE: 59 mg/dL — AB (ref 70–99)
POTASSIUM: 4.1 mmol/L (ref 3.5–5.1)
SODIUM: 138 mmol/L (ref 135–145)
Total Protein: 6.9 g/dL (ref 6.0–8.3)

## 2014-12-13 LAB — GLUCOSE, CAPILLARY
GLUCOSE-CAPILLARY: 122 mg/dL — AB (ref 70–99)
Glucose-Capillary: 74 mg/dL (ref 70–99)
Glucose-Capillary: 70 mg/dL (ref 70–99)
Glucose-Capillary: 71 mg/dL (ref 70–99)
Glucose-Capillary: 93 mg/dL (ref 70–99)

## 2014-12-13 LAB — RAPID URINE DRUG SCREEN, HOSP PERFORMED
AMPHETAMINES: NOT DETECTED
BARBITURATES: NOT DETECTED
BENZODIAZEPINES: NOT DETECTED
Cocaine: NOT DETECTED
Opiates: NOT DETECTED
TETRAHYDROCANNABINOL: NOT DETECTED

## 2014-12-13 LAB — URINALYSIS, ROUTINE W REFLEX MICROSCOPIC
Bilirubin Urine: NEGATIVE
GLUCOSE, UA: 100 mg/dL — AB
Ketones, ur: NEGATIVE mg/dL
LEUKOCYTES UA: NEGATIVE
NITRITE: NEGATIVE
Protein, ur: >300 mg/dL — AB
SPECIFIC GRAVITY, URINE: 1.012 (ref 1.005–1.030)
Urobilinogen, UA: 0.2 mg/dL (ref 0.0–1.0)
pH: 8 (ref 5.0–8.0)

## 2014-12-13 LAB — PHOSPHORUS: PHOSPHORUS: 10.3 mg/dL — AB (ref 2.3–4.6)

## 2014-12-13 LAB — MRSA PCR SCREENING: MRSA by PCR: NEGATIVE

## 2014-12-13 LAB — CBG MONITORING, ED
GLUCOSE-CAPILLARY: 68 mg/dL — AB (ref 70–99)
GLUCOSE-CAPILLARY: 81 mg/dL (ref 70–99)
Glucose-Capillary: 72 mg/dL (ref 70–99)

## 2014-12-13 LAB — TROPONIN I
TROPONIN I: 0.03 ng/mL (ref <0.031)
Troponin I: 0.03 ng/mL (ref <0.031)

## 2014-12-13 LAB — TSH: TSH: 0.811 u[IU]/mL (ref 0.350–4.500)

## 2014-12-13 LAB — BRAIN NATRIURETIC PEPTIDE: B Natriuretic Peptide: 816.3 pg/mL — ABNORMAL HIGH (ref 0.0–100.0)

## 2014-12-13 LAB — MAGNESIUM: Magnesium: 2.2 mg/dL (ref 1.5–2.5)

## 2014-12-13 LAB — URINE MICROSCOPIC-ADD ON

## 2014-12-13 MED ORDER — TECHNETIUM TC 99M SULFUR COLLOID
2.0000 | Freq: Once | INTRAVENOUS | Status: AC | PRN
  Administered 2014-12-13: 2 via ORAL

## 2014-12-13 MED ORDER — ACETAMINOPHEN 325 MG PO TABS
650.0000 mg | ORAL_TABLET | Freq: Four times a day (QID) | ORAL | Status: DC | PRN
  Administered 2014-12-14: 650 mg via ORAL

## 2014-12-13 MED ORDER — AMLODIPINE BESYLATE 10 MG PO TABS
10.0000 mg | ORAL_TABLET | Freq: Every day | ORAL | Status: DC
  Administered 2014-12-13 – 2014-12-15 (×3): 10 mg via ORAL
  Filled 2014-12-13 (×5): qty 1

## 2014-12-13 MED ORDER — DARBEPOETIN ALFA 25 MCG/0.42ML IJ SOSY
25.0000 ug | PREFILLED_SYRINGE | INTRAMUSCULAR | Status: DC
  Administered 2014-12-14: 25 ug via INTRAVENOUS
  Filled 2014-12-13: qty 0.42

## 2014-12-13 MED ORDER — ATORVASTATIN CALCIUM 20 MG PO TABS
20.0000 mg | ORAL_TABLET | Freq: Every day | ORAL | Status: DC
  Administered 2014-12-13 – 2014-12-15 (×3): 20 mg via ORAL
  Filled 2014-12-13 (×4): qty 1

## 2014-12-13 MED ORDER — ONDANSETRON HCL 4 MG/2ML IJ SOLN
4.0000 mg | Freq: Four times a day (QID) | INTRAMUSCULAR | Status: DC | PRN
  Administered 2014-12-13: 4 mg via INTRAVENOUS
  Filled 2014-12-13: qty 2

## 2014-12-13 MED ORDER — SEVELAMER CARBONATE 800 MG PO TABS
1600.0000 mg | ORAL_TABLET | Freq: Every day | ORAL | Status: DC
Start: 2014-12-13 — End: 2014-12-15
  Administered 2014-12-14: 1600 mg via ORAL
  Filled 2014-12-13 (×2): qty 2

## 2014-12-13 MED ORDER — SODIUM CHLORIDE 0.9 % IJ SOLN: 3.0000 mL | INTRAMUSCULAR | Status: DC | PRN

## 2014-12-13 MED ORDER — DOXERCALCIFEROL 4 MCG/2ML IV SOLN
2.0000 ug | INTRAVENOUS | Status: DC
  Administered 2014-12-14: 2 ug via INTRAVENOUS
  Filled 2014-12-13: qty 2

## 2014-12-13 MED ORDER — ONDANSETRON HCL 4 MG PO TABS: 4.0000 mg | ORAL_TABLET | Freq: Four times a day (QID) | ORAL | Status: DC | PRN

## 2014-12-13 MED ORDER — ENOXAPARIN SODIUM 30 MG/0.3ML ~~LOC~~ SOLN
30.0000 mg | SUBCUTANEOUS | Status: DC
  Administered 2014-12-13 – 2014-12-15 (×3): 30 mg via SUBCUTANEOUS
  Filled 2014-12-13 (×3): qty 0.3

## 2014-12-13 MED ORDER — SODIUM CHLORIDE 0.9 % IV SOLN: 62.5000 mg | INTRAVENOUS | Status: DC

## 2014-12-13 MED ORDER — LIDOCAINE-PRILOCAINE 2.5-2.5 % EX CREA: 1.0000 "application " | TOPICAL_CREAM | CUTANEOUS | Status: DC | PRN

## 2014-12-13 MED ORDER — RANOLAZINE ER 500 MG PO TB12
500.0000 mg | ORAL_TABLET | Freq: Two times a day (BID) | ORAL | Status: DC
  Administered 2014-12-13 – 2014-12-15 (×4): 500 mg via ORAL
  Filled 2014-12-13 (×6): qty 1

## 2014-12-13 MED ORDER — ACETAMINOPHEN 650 MG RE SUPP: 650.0000 mg | Freq: Four times a day (QID) | RECTAL | Status: DC | PRN

## 2014-12-13 MED ORDER — HEPARIN SODIUM (PORCINE) 1000 UNIT/ML DIALYSIS
8000.0000 [IU] | Freq: Once | INTRAMUSCULAR | Status: DC
  Filled 2014-12-13: qty 8

## 2014-12-13 MED ORDER — FINASTERIDE 5 MG PO TABS
5.0000 mg | ORAL_TABLET | Freq: Every day | ORAL | Status: DC
  Administered 2014-12-13 – 2014-12-15 (×3): 5 mg via ORAL
  Filled 2014-12-13 (×4): qty 1

## 2014-12-13 MED ORDER — BOOST / RESOURCE BREEZE PO LIQD: 1.0000 | Freq: Every day | ORAL | Status: DC

## 2014-12-13 MED ORDER — RENA-VITE PO TABS
1.0000 | ORAL_TABLET | Freq: Every day | ORAL | Status: DC
  Administered 2014-12-13 – 2014-12-14 (×2): 1 via ORAL
  Filled 2014-12-13 (×3): qty 1

## 2014-12-13 MED ORDER — SODIUM CHLORIDE 0.9 % IV SOLN: 100.0000 mL | INTRAVENOUS | Status: DC | PRN

## 2014-12-13 MED ORDER — CLOPIDOGREL BISULFATE 75 MG PO TABS
75.0000 mg | ORAL_TABLET | Freq: Every day | ORAL | Status: DC
  Administered 2014-12-13 – 2014-12-15 (×3): 75 mg via ORAL
  Filled 2014-12-13 (×4): qty 1

## 2014-12-13 MED ORDER — CINACALCET HCL 30 MG PO TABS
90.0000 mg | ORAL_TABLET | Freq: Every day | ORAL | Status: DC
  Administered 2014-12-13 – 2014-12-15 (×3): 90 mg via ORAL
  Filled 2014-12-13 (×5): qty 3

## 2014-12-13 MED ORDER — DEXTROSE 50 % IV SOLN
25.0000 mL | Freq: Once | INTRAVENOUS | Status: AC
  Administered 2014-12-13: 25 mL via INTRAVENOUS

## 2014-12-13 MED ORDER — IPRATROPIUM-ALBUTEROL 20-100 MCG/ACT IN AERS: 2.0000 | INHALATION_SPRAY | Freq: Four times a day (QID) | RESPIRATORY_TRACT | Status: DC | PRN

## 2014-12-13 MED ORDER — LIDOCAINE HCL (PF) 1 % IJ SOLN
5.0000 mL | INTRAMUSCULAR | Status: DC | PRN
Start: 2014-12-13 — End: 2014-12-14

## 2014-12-13 MED ORDER — IPRATROPIUM-ALBUTEROL 0.5-2.5 (3) MG/3ML IN SOLN: 3.0000 mL | Freq: Four times a day (QID) | RESPIRATORY_TRACT | Status: DC | PRN

## 2014-12-13 MED ORDER — DEXTROSE 50 % IV SOLN
INTRAVENOUS | Status: AC
  Filled 2014-12-13: qty 50

## 2014-12-13 MED ORDER — SODIUM CHLORIDE 0.9 % IJ SOLN
3.0000 mL | Freq: Two times a day (BID) | INTRAMUSCULAR | Status: DC
  Administered 2014-12-13 (×2): 3 mL via INTRAVENOUS

## 2014-12-13 MED ORDER — MORPHINE SULFATE 2 MG/ML IJ SOLN
2.0000 mg | INTRAMUSCULAR | Status: DC | PRN
Start: 2014-12-13 — End: 2014-12-14
  Administered 2014-12-13 – 2014-12-14 (×2): 2 mg via INTRAVENOUS
  Filled 2014-12-13 (×2): qty 1

## 2014-12-13 MED ORDER — PENTAFLUOROPROP-TETRAFLUOROETH EX AERO: 1.0000 "application " | INHALATION_SPRAY | CUTANEOUS | Status: DC | PRN

## 2014-12-13 MED ORDER — HEPARIN SODIUM (PORCINE) 1000 UNIT/ML DIALYSIS
1000.0000 [IU] | INTRAMUSCULAR | Status: DC | PRN
  Filled 2014-12-13: qty 1

## 2014-12-13 MED ORDER — CARVEDILOL 25 MG PO TABS
50.0000 mg | ORAL_TABLET | Freq: Two times a day (BID) | ORAL | Status: DC
  Administered 2014-12-13 – 2014-12-15 (×3): 50 mg via ORAL
  Filled 2014-12-13 (×7): qty 2

## 2014-12-13 MED ORDER — INSULIN ASPART 100 UNIT/ML ~~LOC~~ SOLN
0.0000 [IU] | SUBCUTANEOUS | Status: DC
  Administered 2014-12-13 – 2014-12-15 (×3): 1 [IU] via SUBCUTANEOUS

## 2014-12-13 MED ORDER — SODIUM CHLORIDE 0.9 % IJ SOLN
3.0000 mL | Freq: Two times a day (BID) | INTRAMUSCULAR | Status: DC
  Administered 2014-12-14: 3 mL via INTRAVENOUS

## 2014-12-13 MED ORDER — HYDROCODONE-ACETAMINOPHEN 5-325 MG PO TABS
1.0000 | ORAL_TABLET | ORAL | Status: DC | PRN
  Administered 2014-12-13: 2 via ORAL
  Administered 2014-12-14 (×2): 1 via ORAL
  Filled 2014-12-13 (×2): qty 1
  Filled 2014-12-13: qty 2

## 2014-12-13 MED ORDER — NEPRO/CARBSTEADY PO LIQD: 237.0000 mL | ORAL | Status: DC | PRN

## 2014-12-13 MED ORDER — PREGABALIN 100 MG PO CAPS
100.0000 mg | ORAL_CAPSULE | Freq: Three times a day (TID) | ORAL | Status: DC
  Administered 2014-12-13 (×2): 100 mg via ORAL
  Filled 2014-12-13 (×2): qty 1

## 2014-12-13 MED ORDER — ASPIRIN EC 81 MG PO TBEC
81.0000 mg | DELAYED_RELEASE_TABLET | Freq: Every day | ORAL | Status: DC
  Administered 2014-12-13 – 2014-12-15 (×3): 81 mg via ORAL
  Filled 2014-12-13 (×3): qty 1

## 2014-12-13 MED ORDER — PANTOPRAZOLE SODIUM 40 MG PO TBEC
40.0000 mg | DELAYED_RELEASE_TABLET | Freq: Every day | ORAL | Status: DC
  Administered 2014-12-13 – 2014-12-15 (×3): 40 mg via ORAL
  Filled 2014-12-13 (×3): qty 1

## 2014-12-13 MED ORDER — CALCIUM ACETATE (PHOS BINDER) 667 MG PO CAPS
1334.0000 mg | ORAL_CAPSULE | Freq: Three times a day (TID) | ORAL | Status: DC
  Administered 2014-12-13 – 2014-12-15 (×4): 1334 mg via ORAL
  Filled 2014-12-13 (×8): qty 2

## 2014-12-13 MED ORDER — SEVELAMER CARBONATE 800 MG PO TABS
2400.0000 mg | ORAL_TABLET | Freq: Two times a day (BID) | ORAL | Status: DC
  Administered 2014-12-13 – 2014-12-15 (×3): 2400 mg via ORAL
  Filled 2014-12-13 (×5): qty 3

## 2014-12-13 MED ORDER — SODIUM CHLORIDE 0.9 % IV SOLN: 250.0000 mL | INTRAVENOUS | Status: DC | PRN

## 2014-12-13 MED ORDER — ALTEPLASE 2 MG IJ SOLR
2.0000 mg | Freq: Once | INTRAMUSCULAR | Status: AC | PRN
  Filled 2014-12-13: qty 2

## 2014-12-13 NOTE — Progress Notes (Signed)
[facility] progress note. Chief complaint. Transfer note. History of present illness. This 59 year old male with history hypertension, end-stage renal disease, diabetes on chronic hemodialysis. Does have a known history of coronary artery disease and congestive heart failure. Presented to with chest pain off and on for the past 3 months. Patient also reports nausea and vomiting. Found to have a elevated troponin in the emergency room of 0.04. The patient to was transferred to [facility] for continued treatment and has now arrived. I'm seeing the patient at bedside to ensure he remained clinically stable and that his orders transferred appropriately. Physical exam. Vital signs. Temperature 98.2, pulse 90, respiration 20, blood pressure 187/87. O2 sats 96%. General appearance. Well-developed male who is alert and in no distress. Cardiac. Rate and rhythm regular. Lungs. Breath sounds clear and equal. Abdomen. Soft with mild diffuse tenderness. No guarding or rebound. Impression/plan. Problem #1. Hypertension. Continue home medications. Problem #2. End-stage renal disease. Patient transferred to [facility] for continued dialysis therapy. Nephrology consult in the morning. Problem #3. Coronary artery disease. Continue home medications Plavix and Ranexa. Problem #4. Chest pain. Atypical chest pain with mildly elevated troponin. Patient had recent echocardiogram. If troponins trend up will need cardiology consult. Problem #5. Intractable nausea and vomiting. Possible gastroparesis given diabetic history. A gastric emptying study has been ordered. Problem #6. Diabetes. Sliding scale insulin only given recent poor by mouth intake. Patient appears clinically stable post transfer. All orders appear to of transferred appropriately.

## 2014-12-13 NOTE — Progress Notes (Signed)
Patient admitted after midnight. Please see H&P.  In nuclear medicine for gastric emptying.  CE negative.  Nephrology for dialysis in [facility] DO

## 2014-12-13 NOTE — H&P (Signed)
PCP: none [facility]   Chief Complaint:  Chest  pain  HPI: [name] [name] is a 59 y.o. male   has a past medical history of Hypertension; ESRD on dialysis; Diabetes mellitus with nephropathy; Hemodialysis patient; Hematochezia; Hematuria; Anemia; Depression; CAD (coronary artery disease); Colon polyps; and Chronic diastolic CHF (congestive heart failure).   Presented with  Patient on HD on Tuesday Thursday Saturday last HD was 12/11/2014. Patient presented with chest pain reports chest pressure off and on for a while for the past 3 months. Reports nausea and vomitng for almost 1 year. Reports unable to take his mediation due to ongoing nausea and vomiting for 1 week. Reports 4-5 episodes of diarrhea last night. Patient had some chills this AM.In ER troponin ws elevated slightly to 0.04.   Hospitalist was called for admission for chest pain  Review of Systems:    Pertinent positives include:  chills, abdominal pain, nausea, vomiting, diarrhea, chest pain,  Constitutional:  No weight loss, night sweats, Fevers, fatigue, weight loss  HEENT:  No headaches, Difficulty swallowing,Tooth/dental problems,Sore throat,  No sneezing, itching, ear ache, nasal congestion, post nasal drip,  Cardio-vascular:  No  Orthopnea, PND, anasarca, dizziness, palpitations.no Bilateral lower extremity swelling  GI:  No heartburn, indigestion, change in bowel habits, loss of appetite, melena, blood in stool, hematemesis Resp:  no shortness of breath at rest. No dyspnea on exertion, No excess mucus, no productive cough, No non-productive cough, No coughing up of blood.No change in color of mucus.No wheezing. Skin:  no rash or lesions. No jaundice GU:  no dysuria, change in color of urine, no urgency or frequency. No straining to urinate.  No flank pain.  Musculoskeletal:  No joint pain or no joint swelling. No decreased range of motion. No back pain.  Psych:  No change in mood or affect. No  depression or anxiety. No memory loss.  Neuro: no localizing neurological complaints, no tingling, no weakness, no double vision, no gait abnormality, no slurred speech, no confusion  Otherwise ROS are negative except for above, 10 systems were reviewed  Past Medical History: Past Medical History  Diagnosis Date  . Hypertension   . ESRD on dialysis   . Diabetes mellitus with nephropathy   . Hemodialysis patient   . Hematochezia     a. 2014: colonscopy, which showed moderately-sized internal hemorrhoids, two 75mm polyps in transverse colon and ascending colon that were resected, five 2-51mm polyps in sigmoid colon, descending colon, transverse colon, and ascending colon that were resected. An upper endoscopy was performed and showed normal esophagus, stomach, and duodenum.  . Hematuria     a. H/o hematuria 2014 with cystoscopy that was unrevealing for his source of hematuria. He underwent a kidney ultrasound on 10/14 that showed mildly echogenic and scarred kidneys compatible with medical renal disease, without hydronephrosis or renal calculi.  [name] Anemia   . Depression   . CAD (coronary artery disease)     a. per [facility] s/p BMS to mid LAD 12/2009 and DES to mid LAD 10/2010.  . Colon polyps   . Chronic diastolic CHF (congestive heart failure)    Past Surgical History  Procedure Laterality Date  . Left heart catheterization with coronary angiogram N/A 11/23/2014    Procedure: LEFT HEART CATHETERIZATION WITH CORONARY ANGIOGRAM;  Surgeon: Troy Sine, MD;  Location: [facility] CATH LAB;  Service: Cardiovascular;  Laterality: N/A;     Medications: Prior to Admission medications   Medication Sig Start Date  End Date Taking? Authorizing Provider  acetaminophen (TYLENOL) 500 MG tablet Take 500 mg by mouth every 6 (six) hours as needed for headache.    Yes Historical Provider, MD  amitriptyline (ELAVIL) 100 MG tablet Take 100 mg by mouth at bedtime.   Yes Historical Provider, MD  amLODipine  (NORVASC) 10 MG tablet Take 10 mg by mouth daily.   Yes Historical Provider, MD  aspirin EC 81 MG tablet Take 81 mg by mouth daily.   Yes Historical Provider, MD  atorvastatin (LIPITOR) 20 MG tablet Take 20 mg by mouth daily.   Yes Historical Provider, MD  calcium acetate (PHOSLO) 667 MG capsule Take 2 capsules (1,334 mg total) by mouth 3 (three) times daily with meals. 11/10/14  Yes Albin Felling, MD  carvedilol (COREG) 25 MG tablet Take 50 mg by mouth 2 (two) times daily.    Yes Historical Provider, MD  cinacalcet (SENSIPAR) 90 MG tablet Take 90 mg by mouth daily.   Yes Historical Provider, MD  clopidogrel (PLAVIX) 75 MG tablet Take 1 tablet (75 mg total) by mouth daily. 11/24/14  Yes Marjan Rabbani, MD  finasteride (PROSCAR) 5 MG tablet Take 5 mg by mouth daily.   Yes Historical Provider, MD  insulin aspart (NOVOLOG) 100 UNIT/ML injection Inject 15-20 Units into the skin 3 (three) times daily as needed for high blood sugar (CBG >150).    Yes Historical Provider, MD  insulin glargine (LANTUS) 100 UNIT/ML injection Inject 10 Units into the skin at bedtime.   Yes Historical Provider, MD  Ipratropium-Albuterol (COMBIVENT RESPIMAT) 20-100 MCG/ACT AERS respimat Inhale 2 puffs into the lungs every 6 (six) hours as needed for wheezing.   Yes Historical Provider, MD  meclizine (ANTIVERT) 25 MG tablet Take 25 mg by mouth 3 (three) times daily as needed for dizziness.   Yes Historical Provider, MD  Multiple Vitamin (MULTIVITAMIN WITH MINERALS) TABS tablet Take 1 tablet by mouth daily.   Yes Historical Provider, MD  nitroGLYCERIN (NITROSTAT) 0.4 MG SL tablet Place 0.4 mg under the tongue every 5 (five) minutes as needed for chest pain.   Yes Historical Provider, MD  ondansetron (ZOFRAN) 4 MG tablet Take 1 tablet (4 mg total) by mouth every 6 (six) hours. Patient taking differently: Take 4 mg by mouth every 6 (six) hours as needed for nausea or vomiting.  12/02/14  Yes Britt Bottom, NP  pregabalin (LYRICA) 100  MG capsule Take 100 mg by mouth 3 (three) times daily.   Yes Historical Provider, MD  ranolazine (RANEXA) 500 MG 12 hr tablet Take 1 tablet (500 mg total) by mouth 2 (two) times daily. 11/24/14  Yes Juluis Mire, MD  sevelamer carbonate (RENVELA) 800 MG tablet Take 1,600-2,400 mg by mouth 3 (three) times daily with meals. Pt takes 3 capsules with breakfast, 2 capsules with lunch, and 3 capsules with dinner.   Yes Historical Provider, MD  venlafaxine (EFFEXOR) 37.5 MG tablet Take 37.5 mg by mouth 2 (two) times daily.   Yes Historical Provider, MD  pantoprazole (PROTONIX) 40 MG tablet Take 1 tablet (40 mg total) by mouth daily. Patient not taking: Reported on 12/04/2014 11/24/14   Juluis Mire, MD    Allergies:   Allergies  Allergen Reactions  . Enalapril Hives    Social History:  Ambulatory  independently  Lives at home alone,       reports that he quit smoking about 4 years ago. He has never used smokeless tobacco. He reports that he does not drink alcohol  or use illicit drugs.    Family History: family history includes Anuerysm in his father; Bone cancer in his mother; Diabetes type II in his daughter; Hypertension in an other family member.    Physical Exam: Patient Vitals for the past 24 hrs:  BP Temp Temp src Pulse Resp SpO2  12/13/14 0101 189/92 mmHg - - 91 16 98 %  12/12/14 2218 (!) 183/106 mmHg - - 92 21 96 %  12/12/14 2008 173/84 mmHg 98.1 F (36.7 C) Oral 86 23 97 %  12/12/14 1956 - - - - - 96 %    1. General:  in No Acute distress 2. Psychological: Alert and  Oriented 3. Head/ENT:   Moist   Mucous Membranes                          Head Non traumatic, neck supple                          Normal   Dentition 4. SKIN: normal   Skin turgor,  Skin clean Dry and intact no rash 5. Heart: Regular rate and rhythm no Murmur, Rub or gallop 6. Lungs: Clear to auscultation bilaterally, no wheezes or crackles   7. Abdomen: Soft, non-tender, Non distended 8. Lower  extremities: no clubbing, cyanosis, or edema 9. Neurologically Grossly intact, moving all 4 extremities equally 10. MSK: Normal range of motion  body mass index is unknown because there is no weight on file.   Labs on Admission:   Results for orders placed or performed during the hospital encounter of 12/12/14 (from the past 24 hour(s))  CBC     Status: Abnormal   Collection Time: 12/12/14  9:00 PM  Result Value Ref Range   WBC 6.7 4.0 - 10.5 K/uL   RBC 3.89 (L) 4.22 - 5.81 MIL/uL   Hemoglobin 10.5 (L) 13.0 - 17.0 g/dL   HCT 33.4 (L) 39.0 - 52.0 %   MCV 85.9 78.0 - 100.0 fL   MCH 27.0 26.0 - 34.0 pg   MCHC 31.4 30.0 - 36.0 g/dL   RDW 16.6 (H) 11.5 - 15.5 %   Platelets 250 150 - 400 K/uL  Basic metabolic panel     Status: Abnormal   Collection Time: 12/12/14  9:00 PM  Result Value Ref Range   Sodium 140 135 - 145 mmol/L   Potassium 4.4 3.5 - 5.1 mmol/L   Chloride 101 96 - 112 mmol/L   CO2 27 19 - 32 mmol/L   Glucose, Bld 72 70 - 99 mg/dL   BUN 42 (H) 6 - 23 mg/dL   Creatinine, Ser 10.86 (H) 0.50 - 1.35 mg/dL   Calcium 8.9 8.4 - 10.5 mg/dL   GFR calc non Af Amer 5 (L) >90 mL/min   GFR calc Af Amer 5 (L) >90 mL/min   Anion gap 12 5 - 15  BNP (order ONLY if patient complains of dyspnea/SOB AND you have documented it for THIS visit)     Status: Abnormal   Collection Time: 12/12/14  9:00 PM  Result Value Ref Range   B Natriuretic Peptide 816.3 (H) 0.0 - 100.0 pg/mL  Troponin I     Status: Abnormal   Collection Time: 12/12/14  9:00 PM  Result Value Ref Range   Troponin I 0.04 (H) <0.031 ng/mL    UA pending  Lab Results  Component Value Date   HGBA1C 5.5 11/09/2014  Estimated Creatinine Clearance: 8.3 mL/min (by C-G formula based on Cr of 10.86).  BNP (last 3 results) No results for input(s): PROBNP in the last 8760 hours.  Other results:  I have pearsonaly reviewed this: ECG REPORT  Rate: 84  Rhythm: NSR ST&T Change: no acute ischemia, changes persisted from  prior. Evidence of early repolarization  Last echogram done on 29th of February 2016 showing EF of 55% evidence of LVH and grade 1 diastolic dysfunction   There were no vitals filed for this visit.   Cultures: No results found for: SDES, [place], CULT, REPTSTATUS   Radiological Exams on Admission: Dg Chest 2 View  12/12/2014   CLINICAL DATA:  Chest pain  EXAM: CHEST  2 VIEW  COMPARISON:  12/06/2014  FINDINGS: Chronic elevation left hemidiaphragm is unchanged. Negative for heart failure or pneumonia. Heart size and vascularity are normal. Negative for mass lesion. Left coronary stent noted.  IMPRESSION: No active cardiopulmonary disease.   Electronically Signed   By: Franchot Gallo M.D.   On: 12/12/2014 20:41   Dg Abd 1 View  12/13/2014   CLINICAL DATA:  Nausea, vomiting common diarrhea off and on for 1 month. Chest pain.  EXAM: ABDOMEN - 1 VIEW  COMPARISON:  None.  FINDINGS: Diffuse gas and stool throughout the colon. Gas within nondistended mid abdominal small bowel. No small or large bowel distention. No radiopaque stones. Vascular calcifications in the pelvis. Visualized bones appear intact except for degenerative change.  IMPRESSION: Normal nonobstructive bowel gas pattern.   Electronically Signed   By: Lucienne Capers M.D.   On: 12/13/2014 00:10    Chart has been reviewed  Assessment/Plan  59 year old gentleman with known history of diabetes, end-stage renal disease, diastolic heart failure coronary artery disease status post DES in 2012 presents with intractable nausea and vomiting worse from his baseline as well as chest pain which is intermittent and persistent for the past 3 months  Present on Admission:  . Hypertension patient is currently requesting diet OCC and tolerate his home medications  . ESRD (end stage renal disease)- transected [facility] patient needs dialysis on Tuesday. Please contact nephrology in the morning  . Coronary artery disease due to lipid rich plaque -  will try to restart his home medications of Plavix and Ranexa continue to cycle cardiac enzymes  . Chest pain - very atypical chest pain has been going on for past 3 months but given elevated troponin continue to cycle. Patient have had recent echogram. If troponins starting to trend up will need cardiology consult  . Elevated troponin - very mild continue to follow patient has history of end-stage renal disease and diastolic heart failure chest pain is atypical has been going on for past 3 months  . Intractable nausea and vomiting - patient is diabetic is a possibility of gastroparesis. Will order gastric emptying study. Hold off on Reglan given slightly elevated QTC  . Diastolic dysfunction stable avoid fluid overload Diabetes mellitus - given recently poor by mouth intake will hold insulin. Sliding scale ordered  Prophylaxis:  Lovenox,    CODE STATUS:  FULL CODE   Other plan as per orders.  I have spent a total of 55 min on this admission  [name] [name] 12/13/2014, 1:33 AM  [facility]  Pager [phone]   after 2 AM please page floor coverage PA If 7AM-7PM, please contact the day team taking care of the patient  [email or web address]  Password [email or web address]

## 2014-12-13 NOTE — Consult Note (Signed)
Indication for Consultation:  Management of ESRD/hemodialysis; anemia, hypertension/volume and secondary hyperparathyroidism  HPI: [name] [name] is a 59 y.o. male who presented to the ED with complaints of chest pain, intermittent for the past 3 months. He recieves HD TTS @ [facility]. He was recently admitted in February with chest pain and had cardiac cath 11/23/14- no significant findings. He has had chronic n/v for a year with 60lbs unintentional weight loss since august. He did have diarrhea yesterday. He has difficulty taking PM meds d/t nausea. Will arrange HD  Past Medical History  Diagnosis Date  . Hypertension   . ESRD on dialysis   . Diabetes mellitus with nephropathy   . Hemodialysis patient   . Hematochezia     a. 2014: colonscopy, which showed moderately-sized internal hemorrhoids, two 70mm polyps in transverse colon and ascending colon that were resected, five 2-52mm polyps in sigmoid colon, descending colon, transverse colon, and ascending colon that were resected. An upper endoscopy was performed and showed normal esophagus, stomach, and duodenum.  . Hematuria     a. H/o hematuria 2014 with cystoscopy that was unrevealing for his source of hematuria. He underwent a kidney ultrasound on 10/14 that showed mildly echogenic and scarred kidneys compatible with medical renal disease, without hydronephrosis or renal calculi.  [name] Anemia   . Depression   . CAD (coronary artery disease)     a. per [facility] s/p BMS to mid LAD 12/2009 and DES to mid LAD 10/2010.  . Colon polyps   . Chronic diastolic CHF (congestive heart failure)    Past Surgical History  Procedure Laterality Date  . Left heart catheterization with coronary angiogram N/A 11/23/2014    Procedure: LEFT HEART CATHETERIZATION WITH CORONARY ANGIOGRAM;  Surgeon: Troy Sine, MD;  Location: [facility] CATH LAB;  Service: Cardiovascular;  Laterality: N/A;   Family History  Problem Relation Age of Onset  . Hypertension    . Bone cancer  Mother   . Anuerysm Father   . Diabetes type II Daughter    Social History:  reports that he quit smoking about 4 years ago. He has never used smokeless tobacco. He reports that he does not drink alcohol or use illicit drugs. Allergies  Allergen Reactions  . Enalapril Hives   Prior to Admission medications   Medication Sig Start Date End Date Taking? Authorizing Provider  acetaminophen (TYLENOL) 500 MG tablet Take 500 mg by mouth every 6 (six) hours as needed for headache.    Yes Historical Provider, MD  amitriptyline (ELAVIL) 100 MG tablet Take 100 mg by mouth at bedtime.   Yes Historical Provider, MD  amLODipine (NORVASC) 10 MG tablet Take 10 mg by mouth daily.   Yes Historical Provider, MD  aspirin EC 81 MG tablet Take 81 mg by mouth daily.   Yes Historical Provider, MD  atorvastatin (LIPITOR) 20 MG tablet Take 20 mg by mouth daily.   Yes Historical Provider, MD  calcium acetate (PHOSLO) 667 MG capsule Take 2 capsules (1,334 mg total) by mouth 3 (three) times daily with meals. 11/10/14  Yes Juliet Rude, MD  carvedilol (COREG) 25 MG tablet Take 50 mg by mouth 2 (two) times daily.    Yes Historical Provider, MD  cinacalcet (SENSIPAR) 90 MG tablet Take 90 mg by mouth daily.   Yes Historical Provider, MD  clopidogrel (PLAVIX) 75 MG tablet Take 1 tablet (75 mg total) by mouth daily. 11/24/14  Yes Marjan Rabbani, MD  finasteride (PROSCAR) 5 MG tablet Take 5  mg by mouth daily.   Yes Historical Provider, MD  insulin aspart (NOVOLOG) 100 UNIT/ML injection Inject 15-20 Units into the skin 3 (three) times daily as needed for high blood sugar (CBG >150).    Yes Historical Provider, MD  insulin glargine (LANTUS) 100 UNIT/ML injection Inject 10 Units into the skin at bedtime.   Yes Historical Provider, MD  Ipratropium-Albuterol (COMBIVENT RESPIMAT) 20-100 MCG/ACT AERS respimat Inhale 2 puffs into the lungs every 6 (six) hours as needed for wheezing.   Yes Historical Provider, MD  meclizine (ANTIVERT) 25  MG tablet Take 25 mg by mouth 3 (three) times daily as needed for dizziness.   Yes Historical Provider, MD  Multiple Vitamin (MULTIVITAMIN WITH MINERALS) TABS tablet Take 1 tablet by mouth daily.   Yes Historical Provider, MD  nitroGLYCERIN (NITROSTAT) 0.4 MG SL tablet Place 0.4 mg under the tongue every 5 (five) minutes as needed for chest pain.   Yes Historical Provider, MD  ondansetron (ZOFRAN) 4 MG tablet Take 1 tablet (4 mg total) by mouth every 6 (six) hours. Patient taking differently: Take 4 mg by mouth every 6 (six) hours as needed for nausea or vomiting.  12/02/14  Yes Britt Bottom, NP  pregabalin (LYRICA) 100 MG capsule Take 100 mg by mouth 3 (three) times daily.   Yes Historical Provider, MD  ranolazine (RANEXA) 500 MG 12 hr tablet Take 1 tablet (500 mg total) by mouth 2 (two) times daily. 11/24/14  Yes Juluis Mire, MD  sevelamer carbonate (RENVELA) 800 MG tablet Take 1,600-2,400 mg by mouth 3 (three) times daily with meals. Pt takes 3 capsules with breakfast, 2 capsules with lunch, and 3 capsules with dinner.   Yes Historical Provider, MD  venlafaxine (EFFEXOR) 37.5 MG tablet Take 37.5 mg by mouth 2 (two) times daily.   Yes Historical Provider, MD  pantoprazole (PROTONIX) 40 MG tablet Take 1 tablet (40 mg total) by mouth daily. Patient not taking: Reported on 12/04/2014 11/24/14   Juluis Mire, MD   Current Facility-Administered Medications  Medication Dose Route Frequency Provider Last Rate Last Dose  . 0.9 %  sodium chloride infusion  250 mL Intravenous PRN Toy Baker, MD      . acetaminophen (TYLENOL) tablet 650 mg  650 mg Oral Q6H PRN Toy Baker, MD       Or  . acetaminophen (TYLENOL) suppository 650 mg  650 mg Rectal Q6H PRN Toy Baker, MD      . amLODipine (NORVASC) tablet 10 mg  10 mg Oral Daily Toy Baker, MD      . aspirin EC tablet 81 mg  81 mg Oral Daily Toy Baker, MD      . atorvastatin (LIPITOR) tablet 20 mg  20 mg Oral  Daily Toy Baker, MD      . calcium acetate (PHOSLO) capsule 1,334 mg  1,334 mg Oral TID WC Toy Baker, MD   1,334 mg at 12/13/14 0800  . carvedilol (COREG) tablet 50 mg  50 mg Oral BID WC Toy Baker, MD   50 mg at 12/13/14 0800  . cinacalcet (SENSIPAR) tablet 90 mg  90 mg Oral Q breakfast Toy Baker, MD      . clopidogrel (PLAVIX) tablet 75 mg  75 mg Oral Daily Anastassia Doutova, MD      . dextrose 50 % solution           . enoxaparin (LOVENOX) injection 30 mg  30 mg Subcutaneous Q24H Toy Baker, MD      .  finasteride (PROSCAR) tablet 5 mg  5 mg Oral Daily Toy Baker, MD      . HYDROcodone-acetaminophen (NORCO/VICODIN) 5-325 MG per tablet 1-2 tablet  1-2 tablet Oral Q4H PRN Toy Baker, MD   2 tablet at 12/13/14 0452  . insulin aspart (novoLOG) injection 0-9 Units  0-9 Units Subcutaneous 6 times per day Toy Baker, MD   0 Units at 12/13/14 0400  . ipratropium-albuterol (DUONEB) 0.5-2.5 (3) MG/3ML nebulizer solution 3 mL  3 mL Nebulization Q6H PRN Etta Quill, DO      . morphine 2 MG/ML injection 2 mg  2 mg Intravenous Q3H PRN Toy Baker, MD   2 mg at 12/13/14 0254  . ondansetron (ZOFRAN) tablet 4 mg  4 mg Oral Q6H PRN Toy Baker, MD       Or  . ondansetron (ZOFRAN) injection 4 mg  4 mg Intravenous Q6H PRN Toy Baker, MD   4 mg at 12/13/14 0253  . pregabalin (LYRICA) capsule 100 mg  100 mg Oral TID Toy Baker, MD   100 mg at 12/13/14 1000  . ranolazine (RANEXA) 12 hr tablet 500 mg  500 mg Oral BID Toy Baker, MD   500 mg at 12/13/14 1000  . sevelamer carbonate (RENVELA) tablet 1,600 mg  1,600 mg Oral Q lunch Etta Quill, DO   1,600 mg at 12/13/14 1200  . sevelamer carbonate (RENVELA) tablet 2,400 mg  2,400 mg Oral BID WC Toy Baker, MD   2,400 mg at 12/13/14 0800  . sodium chloride 0.9 % injection 3 mL  3 mL Intravenous Q12H Anastassia Doutova, MD      . sodium chloride 0.9 %  injection 3 mL  3 mL Intravenous Q12H Anastassia Doutova, MD      . sodium chloride 0.9 % injection 3 mL  3 mL Intravenous PRN Toy Baker, MD       Labs: Basic Metabolic Panel:  Recent Labs Lab 12/12/14 2100 12/13/14 0507  NA 140 138  K 4.4 4.1  CL 101 100  CO2 27 22  GLUCOSE 72 59*  BUN 42* 42*  CREATININE 10.86* 11.65*  CALCIUM 8.9 8.8  PHOS  --  10.3*   Liver Function Tests:  Recent Labs Lab 12/13/14 0507  AST 11  ALT 8  ALKPHOS 43  BILITOT 0.8  PROT 6.9  ALBUMIN 3.5   No results for input(s): LIPASE, AMYLASE in the last 168 hours. No results for input(s): AMMONIA in the last 168 hours. CBC:  Recent Labs Lab 12/12/14 2100 12/13/14 0507  WBC 6.7 5.2  HGB 10.5* 10.0*  HCT 33.4* 31.4*  MCV 85.9 84.4  PLT 250 202   Cardiac Enzymes:  Recent Labs Lab 12/12/14 2100 12/13/14 0507  TROPONINI 0.04* 0.03   CBG:  Recent Labs Lab 12/13/14 0335 12/13/14 0905  GLUCAP 74 70   Iron Studies: No results for input(s): IRON, TIBC, TRANSFERRIN, FERRITIN in the last 72 hours. Studies/Results: Dg Chest 2 View  12/12/2014   CLINICAL DATA:  Chest pain  EXAM: CHEST  2 VIEW  COMPARISON:  12/06/2014  FINDINGS: Chronic elevation left hemidiaphragm is unchanged. Negative for heart failure or pneumonia. Heart size and vascularity are normal. Negative for mass lesion. Left coronary stent noted.  IMPRESSION: No active cardiopulmonary disease.   Electronically Signed   By: Franchot Gallo M.D.   On: 12/12/2014 20:41   Dg Abd 1 View  12/13/2014   CLINICAL DATA:  Nausea, vomiting common diarrhea off and on for 1  month. Chest pain.  EXAM: ABDOMEN - 1 VIEW  COMPARISON:  None.  FINDINGS: Diffuse gas and stool throughout the colon. Gas within nondistended mid abdominal small bowel. No small or large bowel distention. No radiopaque stones. Vascular calcifications in the pelvis. Visualized bones appear intact except for degenerative change.  IMPRESSION: Normal nonobstructive bowel  gas pattern.   Electronically Signed   By: Lucienne Capers M.D.   On: 12/13/2014 00:10    Review of Systems: Gen: Positive for weight loss, Denies any fever, chills, sweats, anorexia, fatigue, weakness, malaise, and sleep disorder HEENT: No visual complaints, No history of Retinopathy. Normal external appearance No Epistaxis or Sore throat. No sinusitis.   CV: Positive for intermittent chest pain for months. Denies palpitations, syncope, orthopnea, PND, peripheral edema, and claudication. Resp: Denies dyspnea at rest, dyspnea with exercise, cough, sputum, wheezing, coughing up blood, and pleurisy. GI: Positive for chronic n/v/d Denies abd pain and blood in stool  GU : Denies urinary burning, blood in urine, urinary frequency, urinary hesitancy, nocturnal urination, and urinary incontinence.  No renal calculi. MS: Denies joint pain, limitation of movement, and swelling, stiffness, low back pain, extremity pain. Denies muscle weakness, cramps, atrophy.  No use of non steroidal antiinflammatory drugs. Derm: Denies rash, itching, dry skin, hives, moles, warts, or unhealing ulcers.  Psych: Denies depression, anxiety, memory loss, suicidal ideation, hallucinations, paranoia, and confusion. Heme: Denies bruising, bleeding, and enlarged lymph nodes. Neuro: No headache.  No diplopia. No dysarthria.  No dysphasia.  No history of CVA.  No Seizures. No paresthesias.  No weakness. Endocrine No DM.  No Thyroid disease.  No Adrenal disease.  Physical Exam: Filed Vitals:   12/13/14 0101 12/13/14 0216 12/13/14 0338 12/13/14 0904  BP: 189/92 171/92 187/87 161/75  Pulse: 91 90 90 80  Temp:  97.9 F (36.6 C) 98.2 F (36.8 C) 97.9 F (36.6 C)  TempSrc:  Oral Oral Oral  Resp: 16 22 20 19   Height:   6' (1.829 m)   Weight:   92.715 kg (204 lb 6.4 oz)   SpO2: 98% 95% 96% 98%     General: Well developed, well nourished, in no acute distress. Head: Normocephalic, atraumatic, sclera non-icteric, mucus  membranes are moist Neck: Supple. JVD not elevated. Lungs: Clear bilaterally to auscultation without wheezes, rales, or rhonchi. Breathing is unlabored. Heart: RRR with S1 S2. No murmurs, rubs, or gallops appreciated. Abdomen: Soft, non-tender, non-distended with normoactive bowel sounds. No rebound/guarding. No obvious abdominal masses. M-S:  Strength and tone appear normal for age. Lower extremities:without edema or ischemic changes, no open wounds  Neuro: Alert and oriented X 3. Moves all extremities spontaneously. Psych:  Responds to questions appropriately with a normal affect. Dialysis Access: L AVF +b/t  Dialysis Orders:  TTS [facility] 4hr    2K/2.25ca    89kgs 500/800   L AVF   8000 heparin micera 50 q 2 weeks (start 3/8 ) venofer 50 q week   hectorol 2    Assessment/Plan: 1.  Chest pain- trop slightly elevated, now normal. EKG SR. ECHO 2/29 EF 55%. tox screen negative 2. CAD- ASA and plavix. S/p cardiac cath 2/16- No significant coronary obstructive disease with widely patent LAD stent in the proximal to midsegment with minimal 20% smooth in-stent narrowing and mild 20% ostial narrowing of the first diagonal branch within the stented segment. 3. N/V / wt loss - nuclear med gastric emptying study now. abd xray- no acute findings  4.  ESRD -  TTS  [facility], HD tomorrow. K+ 4.1 5.  Hypertension/volume  - 161/75 home meds carvedilol and nifedipine 6.  Anemia  - hgb 10, given Aranesp tomorrow, cont weekly Fe 7.  Metabolic bone disease -  Cont hectorol, sensipar. phoslo/renvela -last PTH 1008 and phos 5.6 8.  Nutrition - clear liquid w fluid restriction 9. DM- per primary  Shelle Iron, NP [facility] [phone] 12/13/2014, 2:01 PM   Pt seen, examined and agree w A/P as above.  Kelly Splinter MD pager [phone]    cell [phone] 12/13/2014, 5:34 PM

## 2014-12-13 NOTE — Progress Notes (Signed)
INITIAL NUTRITION ASSESSMENT  DOCUMENTATION CODES Per approved criteria  -Not Applicable   INTERVENTION: Provide Resource Breeze po once daily, each supplement provides 250 kcal and 9 grams of protein.  Encourage adequate PO intake.   NUTRITION DIAGNOSIS: Increased nutrient needs related to chronic illness, ESRD as evidenced by estimated nutrition needs.   Goal: Pt to meet >/= 90% of their estimated nutrition needs   Monitor:  PO intake, weight trends, labs, I/O's  Reason for Assessment: MST  59 y.o. male  Admitting Dx: <principal problem not specified>  ASSESSMENT: Pt has a past medical history of Hypertension; ESRD on dialysis; Diabetes mellitus with nephropathy; Hematochezia; Hematuria; Anemia; Depression; CAD; Colon polyps; and CHF. Presents with chest pain and ongoing n/v/d.  Pt was in procedure (gastric emptying) during time of visit. Unable to obtain nutrition hx at this time or perform nutrition focused physical exam. Pt however is familiar to this RD from previous admission on 2/16 stating he had a good appetite PTA. RD to obtain recent hx during next visit. RD to order Resource Breeze to aid in caloric and protein needs as pt was just advanced to a clear liquid diet. Meal completion has been 100%.  Labs and medications reviewed.  Height: Ht Readings from Last 1 Encounters:  12/13/14 6' (1.829 m)    Weight: Wt Readings from Last 1 Encounters:  12/13/14 204 lb 6.4 oz (92.715 kg)    Ideal Body Weight: 178 lbs  % Ideal Body Weight: 115%  Wt Readings from Last 10 Encounters:  12/13/14 204 lb 6.4 oz (92.715 kg)  12/06/14 205 lb (92.987 kg)  12/04/14 206 lb (93.441 kg)  11/24/14 205 lb 11 oz (93.3 kg)  11/09/14 214 lb 15.2 oz (97.5 kg)  04/26/14 242 lb 3.2 oz (109.861 kg)    Usual Body Weight: Unable to obtain  % Usual Body Weight: ---  BMI:  Body mass index is 27.72 kg/(m^2).  Estimated Nutritional Needs: Kcal: 2250-2400 Protein: 115-125  grams Fluid: Per MD  Skin: Intact  Diet Order: Diet clear liquid  EDUCATION NEEDS: -No education needs identified at this time   Intake/Output Summary (Last 24 hours) at 12/13/14 1557 Last data filed at 12/13/14 1540  Gross per 24 hour  Intake    240 ml  Output    100 ml  Net    140 ml    Last BM: 3/6  Labs:   Recent Labs Lab 12/12/14 2100 12/13/14 0507  NA 140 138  K 4.4 4.1  CL 101 100  CO2 27 22  BUN 42* 42*  CREATININE 10.86* 11.65*  CALCIUM 8.9 8.8  MG  --  2.2  PHOS  --  10.3*  GLUCOSE 72 59*    CBG (last 3)   Recent Labs  12/13/14 1333 12/13/14 1414 12/13/14 1433  GLUCAP 68* 81 71    Scheduled Meds: . amLODipine  10 mg Oral Daily  . aspirin EC  81 mg Oral Daily  . atorvastatin  20 mg Oral Daily  . calcium acetate  1,334 mg Oral TID WC  . carvedilol  50 mg Oral BID WC  . cinacalcet  90 mg Oral Q breakfast  . clopidogrel  75 mg Oral Daily  . [START ON 12/14/2014] darbepoetin (ARANESP) injection - DIALYSIS  25 mcg Intravenous Q Tue-HD  . [START ON 12/14/2014] doxercalciferol  2 mcg Intravenous Q T,Th,Sa-HD  . enoxaparin (LOVENOX) injection  30 mg Subcutaneous Q24H  . [START ON 12/16/2014] ferric gluconate (FERRLECIT/NULECIT) IV  62.5 mg Intravenous Q Thu-HD  . finasteride  5 mg Oral Daily  . insulin aspart  0-9 Units Subcutaneous 6 times per day  . multivitamin  1 tablet Oral QHS  . pantoprazole  40 mg Oral Daily  . pregabalin  100 mg Oral TID  . ranolazine  500 mg Oral BID  . sevelamer carbonate  1,600 mg Oral Q lunch  . sevelamer carbonate  2,400 mg Oral BID WC  . sodium chloride  3 mL Intravenous Q12H  . sodium chloride  3 mL Intravenous Q12H    Continuous Infusions:   Past Medical History  Diagnosis Date  . Hypertension   . ESRD on dialysis   . Diabetes mellitus with nephropathy   . Hemodialysis patient   . Hematochezia     a. 2014: colonscopy, which showed moderately-sized internal hemorrhoids, two 40mm polyps in transverse colon  and ascending colon that were resected, five 2-63mm polyps in sigmoid colon, descending colon, transverse colon, and ascending colon that were resected. An upper endoscopy was performed and showed normal esophagus, stomach, and duodenum.  . Hematuria     a. H/o hematuria 2014 with cystoscopy that was unrevealing for his source of hematuria. He underwent a kidney ultrasound on 10/14 that showed mildly echogenic and scarred kidneys compatible with medical renal disease, without hydronephrosis or renal calculi.  [name] Anemia   . Depression   . CAD (coronary artery disease)     a. per [facility] s/p BMS to mid LAD 12/2009 and DES to mid LAD 10/2010.  . Colon polyps   . Chronic diastolic CHF (congestive heart failure)     Past Surgical History  Procedure Laterality Date  . Left heart catheterization with coronary angiogram N/A 11/23/2014    Procedure: LEFT HEART CATHETERIZATION WITH CORONARY ANGIOGRAM;  Surgeon: Troy Sine, MD;  Location: [facility] CATH LAB;  Service: Cardiovascular;  Laterality: N/A;    Kallie Locks, MS, RD, LDN Pager # [phone] After hours/ weekend pager # [phone]

## 2014-12-14 ENCOUNTER — Encounter (HOSPITAL_COMMUNITY): Payer: Self-pay | Admitting: Nephrology

## 2014-12-14 ENCOUNTER — Inpatient Hospital Stay (HOSPITAL_COMMUNITY): Payer: Medicare Other

## 2014-12-14 DIAGNOSIS — N186 End stage renal disease: Secondary | ICD-10-CM

## 2014-12-14 LAB — CBC
HCT: 30.6 % — ABNORMAL LOW (ref 39.0–52.0)
Hemoglobin: 10 g/dL — ABNORMAL LOW (ref 13.0–17.0)
MCH: 26.8 pg (ref 26.0–34.0)
MCHC: 32.7 g/dL (ref 30.0–36.0)
MCV: 82 fL (ref 78.0–100.0)
PLATELETS: 206 10*3/uL (ref 150–400)
RBC: 3.73 MIL/uL — ABNORMAL LOW (ref 4.22–5.81)
RDW: 16.2 % — ABNORMAL HIGH (ref 11.5–15.5)
WBC: 6.6 10*3/uL (ref 4.0–10.5)

## 2014-12-14 LAB — RENAL FUNCTION PANEL
ALBUMIN: 3.3 g/dL — AB (ref 3.5–5.2)
Anion gap: 12 (ref 5–15)
BUN: 46 mg/dL — ABNORMAL HIGH (ref 6–23)
CALCIUM: 8.8 mg/dL (ref 8.4–10.5)
CHLORIDE: 99 mmol/L (ref 96–112)
CO2: 26 mmol/L (ref 19–32)
CREATININE: 11.36 mg/dL — AB (ref 0.50–1.35)
GFR calc Af Amer: 5 mL/min — ABNORMAL LOW (ref >90)
GFR, EST NON AFRICAN AMERICAN: 4 mL/min — AB (ref >90)
Glucose, Bld: 94 mg/dL (ref 70–99)
PHOSPHORUS: 7.8 mg/dL — AB (ref 2.3–4.6)
Potassium: 4.4 mmol/L (ref 3.5–5.1)
Sodium: 137 mmol/L (ref 135–145)

## 2014-12-14 LAB — HEMOGLOBIN A1C
HEMOGLOBIN A1C: 5.5 % (ref 4.8–5.6)
MEAN PLASMA GLUCOSE: 111 mg/dL

## 2014-12-14 LAB — GLUCOSE, CAPILLARY
GLUCOSE-CAPILLARY: 126 mg/dL — AB (ref 70–99)
GLUCOSE-CAPILLARY: 131 mg/dL — AB (ref 70–99)
GLUCOSE-CAPILLARY: 76 mg/dL (ref 70–99)
GLUCOSE-CAPILLARY: 92 mg/dL (ref 70–99)
Glucose-Capillary: 105 mg/dL — ABNORMAL HIGH (ref 70–99)
Glucose-Capillary: 70 mg/dL (ref 70–99)
Glucose-Capillary: 79 mg/dL (ref 70–99)

## 2014-12-14 LAB — TROPONIN I: TROPONIN I: 0.03 ng/mL (ref <0.031)

## 2014-12-14 MED ORDER — ACETAMINOPHEN 325 MG PO TABS
ORAL_TABLET | ORAL | Status: AC
  Filled 2014-12-14: qty 2

## 2014-12-14 MED ORDER — TECHNETIUM TO 99M ALBUMIN AGGREGATED
6.0000 | Freq: Once | INTRAVENOUS | Status: AC | PRN
  Administered 2014-12-14: 6 via INTRAVENOUS

## 2014-12-14 MED ORDER — DARBEPOETIN ALFA 25 MCG/0.42ML IJ SOSY
PREFILLED_SYRINGE | INTRAMUSCULAR | Status: AC
  Administered 2014-12-14: 25 ug via INTRAVENOUS
  Filled 2014-12-14: qty 0.42

## 2014-12-14 MED ORDER — CLONAZEPAM 0.5 MG PO TABS
0.2500 mg | ORAL_TABLET | Freq: Two times a day (BID) | ORAL | Status: DC | PRN
  Administered 2014-12-14: 0.25 mg via ORAL
  Filled 2014-12-14: qty 1

## 2014-12-14 MED ORDER — DOXERCALCIFEROL 4 MCG/2ML IV SOLN
INTRAVENOUS | Status: AC
  Administered 2014-12-14: 2 ug via INTRAVENOUS
  Filled 2014-12-14: qty 2

## 2014-12-14 MED ORDER — PREGABALIN 75 MG PO CAPS
75.0000 mg | ORAL_CAPSULE | Freq: Every day | ORAL | Status: DC
  Administered 2014-12-14: 75 mg via ORAL
  Filled 2014-12-14: qty 1

## 2014-12-14 MED ORDER — SUCRALFATE 1 GM/10ML PO SUSP
1.0000 g | Freq: Three times a day (TID) | ORAL | Status: DC
  Administered 2014-12-14 – 2014-12-15 (×4): 1 g via ORAL
  Filled 2014-12-14 (×8): qty 10

## 2014-12-14 MED ORDER — TECHNETIUM TC 99M DIETHYLENETRIAME-PENTAACETIC ACID: 40.0000 | Freq: Once | INTRAVENOUS | Status: AC | PRN

## 2014-12-14 MED ORDER — HYDRALAZINE HCL 20 MG/ML IJ SOLN: 10.0000 mg | Freq: Four times a day (QID) | INTRAMUSCULAR | Status: DC | PRN

## 2014-12-14 NOTE — Progress Notes (Signed)
Pt co shortness of breath o2 sat 97& on 3 lpm via [facility].  Also co mid epigastric pain 7/10, gave 2 mg morphine given. VSS 162/72 hr 94 o2 sat 97 on 3lpm via . Paged Dr. Legrand Rams

## 2014-12-14 NOTE — Progress Notes (Signed)
Pt co pain 6/10 mid epigastric area.  BP 179/86 hr 97 O2 sat 98 % RA.  Pt to receive coreg 50 mg po

## 2014-12-14 NOTE — Care Management Note (Signed)
This pt was scheduled for an appointment in the Internal Medicine Clinic and failed to keep that appointment. As he is now readmitted , this CM called the IM Clinic to inquire about rescheduling this pt for an appointment. The clinic will not reschedule unless the request is made by a member of the IM team, as the pt failed to keep the appointment, failed to call to notify the clinic and did not attempt to reschedule. This CM will provide the pt with the phone number for [facility] so that he can call and receive the names and numbers of local MDs accepting new patients. This pt has insurance so this should not be difficult for him. This must be done by the pt. The new number for [facility] is [phone].

## 2014-12-14 NOTE — Progress Notes (Signed)
PROGRESS NOTE  [name] [name] [id number] DOB: July 13, 1956 DOA: 12/12/2014 PCP: [name]  Assessment/Plan: Hypertension-resume home meds  ESRD (end stage renal disease)- HD 3/17  Coronary artery disease due to lipid rich plaque - resume Plavix and Ranexa; CE negative   Chest pain - very atypical chest pain has been going on for past 3 months  Recent echo ok CE negative -v/Q scan to r/o PE- had episode of dialysis -suspect OSA- outpatient sleep study    Intractable nausea and vomiting - gastroparesis study negative,.   Diastolic dysfunction stable avoid fluid overload  Diabetes mellitus - SSI  Code Status: full Family Communication:patient Disposition Plan:    Consultants:  renal  Procedures:      HPI/Subjective: C/o some chest discomfort O2 sats dropped to 80s overnight  Objective: Filed Vitals:   12/14/14 1124  BP: 174/93  Pulse: 80  Temp: 99 F (37.2 C)  Resp: 20    Intake/Output Summary (Last 24 hours) at 12/14/14 1346 Last data filed at 12/14/14 1124  Gross per 24 hour  Intake    600 ml  Output   3500 ml  Net  -2900 ml   Filed Weights   12/13/14 2011 12/14/14 0705 12/14/14 1124  Weight: 94.62 kg (208 lb 9.6 oz) 92.4 kg (203 lb 11.3 oz) 89.4 kg (197 lb 1.5 oz)    Exam:   General:  Awake, NAD- appears anxious  Cardiovascular: rrr  Respiratory: clear  Abdomen: +BS  Musculoskeletal: no edema  Data Reviewed: Basic Metabolic Panel:  Recent Labs Lab 12/12/14 2100 12/13/14 0507 12/14/14 0738  NA 140 138 137  K 4.4 4.1 4.4  CL 101 100 99  CO2 27 22 26   GLUCOSE 72 59* 94  BUN 42* 42* 46*  CREATININE 10.86* 11.65* 11.36*  CALCIUM 8.9 8.8 8.8  MG  --  2.2  --   PHOS  --  10.3* 7.8*   Liver Function Tests:  Recent Labs Lab 12/13/14 0507 12/14/14 0738  AST 11  --   ALT 8  --   ALKPHOS 43  --   BILITOT 0.8  --   PROT 6.9  --   ALBUMIN 3.5 3.3*   No results for input(s): LIPASE, AMYLASE in the last 168  hours. No results for input(s): AMMONIA in the last 168 hours. CBC:  Recent Labs Lab 12/12/14 2100 12/13/14 0507 12/14/14 0738  WBC 6.7 5.2 6.6  HGB 10.5* 10.0* 10.0*  HCT 33.4* 31.4* 30.6*  MCV 85.9 84.4 82.0  PLT 250 202 206   Cardiac Enzymes:  Recent Labs Lab 12/12/14 2100 12/13/14 0507 12/13/14 1545  TROPONINI 0.04* 0.03 0.03   BNP (last 3 results)  Recent Labs  11/20/14 2245 12/06/14 0845 12/12/14 2100  BNP 75.6 86.9 816.3*    ProBNP (last 3 results) No results for input(s): PROBNP in the last 8760 hours.  CBG:  Recent Labs Lab 12/13/14 2016 12/14/14 0042 12/14/14 0111 12/14/14 0424 12/14/14 1209  GLUCAP 122* 70 76 79 105*    Recent Results (from the past 240 hour(s))  MRSA PCR Screening     Status: None   Collection Time: 12/13/14  4:04 AM  Result Value Ref Range Status   MRSA by PCR NEGATIVE NEGATIVE Final    Comment:        The GeneXpert MRSA Assay (FDA approved for NASAL specimens only), is one component of a comprehensive MRSA colonization surveillance program. It is not intended to diagnose MRSA infection nor to guide  or monitor treatment for MRSA infections.      Studies: Dg Chest 2 View  12/12/2014   CLINICAL DATA:  Chest pain  EXAM: CHEST  2 VIEW  COMPARISON:  12/06/2014  FINDINGS: Chronic elevation left hemidiaphragm is unchanged. Negative for heart failure or pneumonia. Heart size and vascularity are normal. Negative for mass lesion. Left coronary stent noted.  IMPRESSION: No active cardiopulmonary disease.   Electronically Signed   By: Franchot Gallo M.D.   On: 12/12/2014 20:41   Dg Abd 1 View  12/13/2014   CLINICAL DATA:  Nausea, vomiting common diarrhea off and on for 1 month. Chest pain.  EXAM: ABDOMEN - 1 VIEW  COMPARISON:  None.  FINDINGS: Diffuse gas and stool throughout the colon. Gas within nondistended mid abdominal small bowel. No small or large bowel distention. No radiopaque stones. Vascular calcifications in the  pelvis. Visualized bones appear intact except for degenerative change.  IMPRESSION: Normal nonobstructive bowel gas pattern.   Electronically Signed   By: Lucienne Capers M.D.   On: 12/13/2014 00:10   Nm Gastric Emptying  12/13/2014   CLINICAL DATA:  59 year old male with intractable nausea vomiting. Diabetes. Abdominal pain and postprandial bloating. Initial encounter.  EXAM: NUCLEAR MEDICINE GASTRIC EMPTYING SCAN  TECHNIQUE: After oral ingestion of radiolabeled meal, sequential abdominal images were obtained for 4 hours. Percentage of activity emptying the stomach calculated at 1 hour, 2 hour, 3 hour, and 4 hours.  RADIOPHARMACEUTICALS:  2.0 mCi Technetium 99-m labeled sulfur colloid  COMPARISON:  KUB 12/12/2014.  FINDINGS: Expected location of the stomach in the left upper quadrant. Ingested meal empties the stomach gradually over the course of the study.  10% Emptied at 1 hr ( normal >= 10%)  79% emptied at 2 hr ( normal >= 40%)  99% emptied at 3 hr ( normal >= 70%)  100% emptied at 4 hr ( normal >= 90%)  IMPRESSION: Normal gastric emptying study.   Electronically Signed   By: Genevie Ann M.D.   On: 12/13/2014 14:15    Scheduled Meds: . amLODipine  10 mg Oral Daily  . aspirin EC  81 mg Oral Daily  . atorvastatin  20 mg Oral Daily  . calcium acetate  1,334 mg Oral TID WC  . carvedilol  50 mg Oral BID WC  . cinacalcet  90 mg Oral Q breakfast  . clopidogrel  75 mg Oral Daily  . darbepoetin (ARANESP) injection - DIALYSIS  25 mcg Intravenous Q Tue-HD  . doxercalciferol  2 mcg Intravenous Q T,Th,Sa-HD  . enoxaparin (LOVENOX) injection  30 mg Subcutaneous Q24H  . feeding supplement (RESOURCE BREEZE)  1 Container Oral Q1500  . [START ON 12/16/2014] ferric gluconate (FERRLECIT/NULECIT) IV  62.5 mg Intravenous Q Thu-HD  . finasteride  5 mg Oral Daily  . insulin aspart  0-9 Units Subcutaneous 6 times per day  . multivitamin  1 tablet Oral QHS  . pantoprazole  40 mg Oral Daily  . pregabalin  75 mg Oral  Daily  . ranolazine  500 mg Oral BID  . sevelamer carbonate  1,600 mg Oral Q lunch  . sevelamer carbonate  2,400 mg Oral BID WC  . sodium chloride  3 mL Intravenous Q12H  . sodium chloride  3 mL Intravenous Q12H  . sucralfate  1 g Oral TID WC & HS   Continuous Infusions:  Antibiotics Given (last 72 hours)    None      Active Problems:   Chest pain  ESRD (end stage renal disease)   Hypertension   Diabetes mellitus type 2, insulin dependent   Coronary artery disease due to lipid rich plaque   Elevated troponin   Intractable nausea and vomiting   Diastolic dysfunction    Time spent: 25 min    Marshall Roehrich  [facility] Pager [phone]. If 7PM-7AM, please contact night-coverage at [email or web address], password [facility] 12/14/2014, 1:46 PM  LOS: 1 day

## 2014-12-14 NOTE — Progress Notes (Addendum)
Subjective:   Still having mild chest pain but no change. Sats dropped to 80s  Objective Filed Vitals:   12/14/14 0900 12/14/14 0930 12/14/14 1000 12/14/14 1030  BP: 158/79 159/75 183/93 168/97  Pulse: 85 76 80 84  Temp:      TempSrc:      Resp:      Height:      Weight:      SpO2:       Physical Exam General: alert and oriented. No acute distress.  Heart: RRR Lungs: CTA, unlabored Abdomen: soft, nontender +BS  Extremities: no edema  Dialysis Access: L AVF patent on [facility]  Dialysis Orders: TTS AF 4hr 2K/2.25ca 89kgs  500/800 L AVF 8000 heparin micera 50 q 2 weeks (start 3/8 ) venofer 50 q week hectorol 2    Assessment/Plan: 1. Chest pain- trop slightly elevated, now normal. EKG SR. ECHO 2/29 EF 55%. tox screen negative 2. CAD- ASA and plavix. S/p cardiac cath 2/16- No significant coronary obstructive disease with widely patent LAD stent in the proximal to midsegment with minimal 20% smooth in-stent narrowing and mild 20% ostial narrowing of the first diagonal branch within the stented segment. 3. N/V / wt loss - nuclear med gastric emptying normal. abd xray- no acute findings. Cdiff pending- no BM since sunday 4. ESRD - TTS AF, HD today 5. Hypertension/volume - 168/97  home meds carvedilol and nifedipine. 3.5 kgs over edw pre HD 6. Anemia - hgb 10, given Aranesp tomorrow, cont weekly Fe 7. Metabolic bone disease - Ca+ 8.8Cont hectorol, sensipar. phoslo/renvela -last PTH 1008 and phos 5.6 8. Nutrition - renal diet.  9. DM- per primary 10. dispo- likely DC today/ needs PCP   Shelle Iron, NP [place] [phone] 12/14/2014,10:43 AM  LOS: 1 day   Pt seen, examined, agree w assess/plan as above with additions as indicated. Having CP , SOB and hypoxic prior to HD today. Post HD still having CP, but SOB better and SaO2 95% on RA, 4kg removed w/o cramping or BP drop. Have ordered another EKG and troponin for now.  Kelly Splinter MD pager  [phone]    cell [phone] 12/14/2014, 11:28 AM     Additional Objective Labs: Basic Metabolic Panel:  Recent Labs Lab 12/12/14 2100 12/13/14 0507 12/14/14 0738  NA 140 138 137  K 4.4 4.1 4.4  CL 101 100 99  CO2 27 22 26   GLUCOSE 72 59* 94  BUN 42* 42* 46*  CREATININE 10.86* 11.65* 11.36*  CALCIUM 8.9 8.8 8.8  PHOS  --  10.3* 7.8*   Liver Function Tests:  Recent Labs Lab 12/13/14 0507 12/14/14 0738  AST 11  --   ALT 8  --   ALKPHOS 43  --   BILITOT 0.8  --   PROT 6.9  --   ALBUMIN 3.5 3.3*   No results for input(s): LIPASE, AMYLASE in the last 168 hours. CBC:  Recent Labs Lab 12/12/14 2100 12/13/14 0507 12/14/14 0738  WBC 6.7 5.2 6.6  HGB 10.5* 10.0* 10.0*  HCT 33.4* 31.4* 30.6*  MCV 85.9 84.4 82.0  PLT 250 202 206   Blood Culture No results found for: SDES, SPECREQUEST, CULT, REPTSTATUS  Cardiac Enzymes:  Recent Labs Lab 12/12/14 2100 12/13/14 0507 12/13/14 1545  TROPONINI 0.04* 0.03 0.03   CBG:  Recent Labs Lab 12/13/14 1653 12/13/14 2016 12/14/14 0042 12/14/14 0111 12/14/14 0424  GLUCAP 93 122* 70 76 79   Iron Studies: No results for input(s):  IRON, TIBC, TRANSFERRIN, FERRITIN in the last 72 hours. @lablastinr3 @ Studies/Results: Dg Chest 2 View  12/12/2014   CLINICAL DATA:  Chest pain  EXAM: CHEST  2 VIEW  COMPARISON:  12/06/2014  FINDINGS: Chronic elevation left hemidiaphragm is unchanged. Negative for heart failure or pneumonia. Heart size and vascularity are normal. Negative for mass lesion. Left coronary stent noted.  IMPRESSION: No active cardiopulmonary disease.   Electronically Signed   By: Franchot Gallo M.D.   On: 12/12/2014 20:41   Dg Abd 1 View  12/13/2014   CLINICAL DATA:  Nausea, vomiting common diarrhea off and on for 1 month. Chest pain.  EXAM: ABDOMEN - 1 VIEW  COMPARISON:  None.  FINDINGS: Diffuse gas and stool throughout the colon. Gas within nondistended mid abdominal small bowel. No small or large bowel  distention. No radiopaque stones. Vascular calcifications in the pelvis. Visualized bones appear intact except for degenerative change.  IMPRESSION: Normal nonobstructive bowel gas pattern.   Electronically Signed   By: Lucienne Capers M.D.   On: 12/13/2014 00:10   Nm Gastric Emptying  12/13/2014   CLINICAL DATA:  59 year old male with intractable nausea vomiting. Diabetes. Abdominal pain and postprandial bloating. Initial encounter.  EXAM: NUCLEAR MEDICINE GASTRIC EMPTYING SCAN  TECHNIQUE: After oral ingestion of radiolabeled meal, sequential abdominal images were obtained for 4 hours. Percentage of activity emptying the stomach calculated at 1 hour, 2 hour, 3 hour, and 4 hours.  RADIOPHARMACEUTICALS:  2.0 mCi Technetium 99-m labeled sulfur colloid  COMPARISON:  KUB 12/12/2014.  FINDINGS: Expected location of the stomach in the left upper quadrant. Ingested meal empties the stomach gradually over the course of the study.  10% Emptied at 1 hr ( normal >= 10%)  79% emptied at 2 hr ( normal >= 40%)  99% emptied at 3 hr ( normal >= 70%)  100% emptied at 4 hr ( normal >= 90%)  IMPRESSION: Normal gastric emptying study.   Electronically Signed   By: Genevie Ann M.D.   On: 12/13/2014 14:15   Medications:   . acetaminophen      . amLODipine  10 mg Oral Daily  . aspirin EC  81 mg Oral Daily  . atorvastatin  20 mg Oral Daily  . calcium acetate  1,334 mg Oral TID WC  . carvedilol  50 mg Oral BID WC  . cinacalcet  90 mg Oral Q breakfast  . clopidogrel  75 mg Oral Daily  . darbepoetin (ARANESP) injection - DIALYSIS  25 mcg Intravenous Q Tue-HD  . doxercalciferol  2 mcg Intravenous Q T,Th,Sa-HD  . enoxaparin (LOVENOX) injection  30 mg Subcutaneous Q24H  . feeding supplement (RESOURCE BREEZE)  1 Container Oral Q1500  . [START ON 12/16/2014] ferric gluconate (FERRLECIT/NULECIT) IV  62.5 mg Intravenous Q Thu-HD  . finasteride  5 mg Oral Daily  . heparin  8,000 Units Dialysis Once in dialysis  . insulin aspart  0-9  Units Subcutaneous 6 times per day  . multivitamin  1 tablet Oral QHS  . pantoprazole  40 mg Oral Daily  . pregabalin  100 mg Oral TID  . ranolazine  500 mg Oral BID  . sevelamer carbonate  1,600 mg Oral Q lunch  . sevelamer carbonate  2,400 mg Oral BID WC  . sodium chloride  3 mL Intravenous Q12H  . sodium chloride  3 mL Intravenous Q12H

## 2014-12-15 DIAGNOSIS — K2970 Gastritis, unspecified, without bleeding: Principal | ICD-10-CM

## 2014-12-15 DIAGNOSIS — K2990 Gastroduodenitis, unspecified, without bleeding: Secondary | ICD-10-CM

## 2014-12-15 LAB — GLUCOSE, CAPILLARY
GLUCOSE-CAPILLARY: 140 mg/dL — AB (ref 70–99)
Glucose-Capillary: 118 mg/dL — ABNORMAL HIGH (ref 70–99)

## 2014-12-15 MED ORDER — PREGABALIN 50 MG PO CAPS: 50.0000 mg | ORAL_CAPSULE | ORAL | Status: DC

## 2014-12-15 MED ORDER — RENA-VITE PO TABS: 1.0000 | ORAL_TABLET | Freq: Every day | ORAL | Status: DC

## 2014-12-15 MED ORDER — PREGABALIN 50 MG PO CAPS
50.0000 mg | ORAL_CAPSULE | Freq: Every day | ORAL | Status: DC
  Administered 2014-12-15: 50 mg via ORAL
  Filled 2014-12-15: qty 1

## 2014-12-15 MED ORDER — DOXERCALCIFEROL 4 MCG/2ML IV SOLN: 2.0000 ug | INTRAVENOUS | Status: DC

## 2014-12-15 MED ORDER — PREGABALIN 50 MG PO CAPS: 50.0000 mg | ORAL_CAPSULE | Freq: Every day | ORAL | Status: DC

## 2014-12-15 MED ORDER — AMITRIPTYLINE HCL 100 MG PO TABS: 100.0000 mg | ORAL_TABLET | Freq: Every day | ORAL | Status: DC

## 2014-12-15 MED ORDER — SUCRALFATE 1 GM/10ML PO SUSP: 1.0000 g | Freq: Three times a day (TID) | ORAL | Status: DC

## 2014-12-15 MED ORDER — DARBEPOETIN ALFA 25 MCG/0.42ML IJ SOSY: 25.0000 ug | PREFILLED_SYRINGE | INTRAMUSCULAR | Status: DC

## 2014-12-15 MED ORDER — SODIUM CHLORIDE 0.9 % IV SOLN: 62.5000 mg | INTRAVENOUS | Status: DC

## 2014-12-15 NOTE — Clinical Social Work Note (Signed)
Patient discharged home today. Per patient's request, provided with cab voucher to get to [facility] on [address] to get his medication. He will then take [place] bus home.   Meral Geissinger Givens, MSW, LCSW Licensed Clinical Social Worker [place] [phone]

## 2014-12-15 NOTE — Discharge Summary (Addendum)
Physician Discharge Summary  [name] [name] [id number] DOB: Jul 11, 1956 DOA: 12/12/2014  PCP: [name]  Admit date: 12/12/2014 Discharge date: 12/15/2014  Time spent: 35 minutes  Recommendations for Outpatient Follow-up:  1. Needs to establish with PCP and get GI referral 2. Needs outpatient sleep study  Discharge Diagnoses:  Active Problems:   Chest pain   ESRD (end stage renal disease)   Hypertension   Diabetes mellitus type 2, insulin dependent   Coronary artery disease due to lipid rich plaque   Elevated troponin   Intractable nausea and vomiting   Diastolic dysfunction   Discharge Condition: improved  Diet recommendation: cardiac/diabetic  Filed Weights   12/14/14 0705 12/14/14 1124 12/14/14 2153  Weight: 92.4 kg (203 lb 11.3 oz) 89.4 kg (197 lb 1.5 oz) 91.309 kg (201 lb 4.8 oz)    History of present illness:  has a past medical history of Hypertension; ESRD on dialysis; Diabetes mellitus with nephropathy; Hemodialysis patient; Hematochezia; Hematuria; Anemia; Depression; CAD (coronary artery disease); Colon polyps; and Chronic diastolic CHF (congestive heart failure).   Presented with  Patient on HD on Tuesday Thursday Saturday last HD was 12/11/2014. Patient presented with chest pain reports chest pressure off and on for a while for the past 3 months. Reports nausea and vomitng for almost 1 year. Reports unable to take his mediation due to ongoing nausea and vomiting for 1 week. Reports 4-5 episodes of diarrhea last night. Patient had some chills this AM.In ER troponin ws elevated slightly to 0.04.   Hospitalist was called for admission for chest pain  Hospital Course:  Hypertension-resume home meds  ESRD (end stage renal disease)- s/p  Coronary artery disease due to lipid rich plaque - resume Plavix and Ranexa; CE negative   Chest pain - very atypical chest pain has been going on for past 3 months - RESPONDED to [facility]- suspect gastritis but will need  GI referral and EGD to confirm Recent echo ok CE negative -v/Q scan to r/o PE negative -suspect OSA- outpatient sleep study  Intractable nausea and vomiting - gastroparesis study negative Patient eating well  Diastolic dysfunction stable avoid fluid overload  Diabetes mellitus - SSI  Procedures:  V/q scan   Gastric emptying  Consultations:  renal  Discharge Exam: Filed Vitals:   12/15/14 0930  BP: 167/96  Pulse: 84  Temp: 98 F (36.7 C)  Resp: 20    General: A+Ox3, NAD Cardiovascular: rrr Respiratory: clear  Discharge Instructions   Discharge Instructions    Discharge instructions    Complete by:  As directed   Continue HD You MUST make an appointment with a PCP And get a GI referral for EGD Monitor blood sugars at home and bring to PCP Renal/carb mod diet     Increase activity slowly    Complete by:  As directed           Current Discharge Medication List    START taking these medications   Details  Darbepoetin Alfa (ARANESP) 25 MCG/0.42ML SOSY injection Inject 0.42 mLs (25 mcg total) into the vein every Tuesday with hemodialysis. Qty: 14.28 mL    doxercalciferol (HECTOROL) 4 MCG/2ML injection Inject 1 mL (2 mcg total) into the vein Every Tuesday,Thursday,and Saturday with dialysis. Qty: 2 mL    ferric gluconate 62.5 mg in sodium chloride 0.9 % 100 mL Inject 62.5 mg into the vein every Thursday with hemodialysis.    multivitamin (RENA-VIT) TABS tablet Take 1 tablet by mouth at bedtime. Qty: 30  tablet, Refills: 0    sucralfate (CARAFATE) 1 GM/10ML suspension Take 10 mLs (1 g total) by mouth 4 (four) times daily -  with meals and at bedtime. Qty: 420 mL, Refills: 0      CONTINUE these medications which have CHANGED   Details  amitriptyline (ELAVIL) 100 MG tablet Take 1 tablet (100 mg total) by mouth at bedtime. Qty: 30 tablet, Refills: 0    !! pregabalin (LYRICA) 50 MG capsule Take 1 capsule (50 mg total) by mouth daily. Qty: 30 capsule,  Refills: 0    !! pregabalin (LYRICA) 50 MG capsule Take 1 capsule (50 mg total) by mouth every Tuesday, Thursday, and Saturday at 6 PM. Qty: 20 capsule, Refills: 0     !! - Potential duplicate medications found. Please discuss with provider.    CONTINUE these medications which have NOT CHANGED   Details  acetaminophen (TYLENOL) 500 MG tablet Take 500 mg by mouth every 6 (six) hours as needed for headache.     amLODipine (NORVASC) 10 MG tablet Take 10 mg by mouth daily.    aspirin EC 81 MG tablet Take 81 mg by mouth daily.    atorvastatin (LIPITOR) 20 MG tablet Take 20 mg by mouth daily.    calcium acetate (PHOSLO) 667 MG capsule Take 2 capsules (1,334 mg total) by mouth 3 (three) times daily with meals. Qty: 90 capsule, Refills: 0    carvedilol (COREG) 25 MG tablet Take 50 mg by mouth 2 (two) times daily.     cinacalcet (SENSIPAR) 90 MG tablet Take 90 mg by mouth daily.    clopidogrel (PLAVIX) 75 MG tablet Take 1 tablet (75 mg total) by mouth daily. Qty: 30 tablet, Refills: 11    finasteride (PROSCAR) 5 MG tablet Take 5 mg by mouth daily.    insulin aspart (NOVOLOG) 100 UNIT/ML injection Inject 15-20 Units into the skin 3 (three) times daily as needed for high blood sugar (CBG >150).     insulin glargine (LANTUS) 100 UNIT/ML injection Inject 10 Units into the skin at bedtime.    Ipratropium-Albuterol (COMBIVENT RESPIMAT) 20-100 MCG/ACT AERS respimat Inhale 2 puffs into the lungs every 6 (six) hours as needed for wheezing.    meclizine (ANTIVERT) 25 MG tablet Take 25 mg by mouth 3 (three) times daily as needed for dizziness.    ondansetron (ZOFRAN) 4 MG tablet Take 1 tablet (4 mg total) by mouth every 6 (six) hours. Qty: 12 tablet, Refills: 0    ranolazine (RANEXA) 500 MG 12 hr tablet Take 1 tablet (500 mg total) by mouth 2 (two) times daily. Qty: 60 tablet, Refills: 3    sevelamer carbonate (RENVELA) 800 MG tablet Take 1,600-2,400 mg by mouth 3 (three) times daily with  meals. Pt takes 3 capsules with breakfast, 2 capsules with lunch, and 3 capsules with dinner.    venlafaxine (EFFEXOR) 37.5 MG tablet Take 37.5 mg by mouth 2 (two) times daily.    pantoprazole (PROTONIX) 40 MG tablet Take 1 tablet (40 mg total) by mouth daily. Qty: 30 tablet, Refills: 0      STOP taking these medications     Multiple Vitamin (MULTIVITAMIN WITH MINERALS) TABS tablet      nitroGLYCERIN (NITROSTAT) 0.4 MG SL tablet        Allergies  Allergen Reactions  . Enalapril Hives   Follow-up Information    Please follow up.   Why:  To arrange a primary care doctor the pt will need to call [phone] for  the names and number of local doctors accepting new patients.       Please follow up.   Why:  will need GI Referral once PCP established       The results of significant diagnostics from this hospitalization (including imaging, microbiology, ancillary and laboratory) are listed below for reference.    Significant Diagnostic Studies: Dg Chest 2 View  12/14/2014   CLINICAL DATA:  Chest pain and sob x 3 days  EXAM: CHEST - 2 VIEW  COMPARISON:  12/12/2014  FINDINGS: Low volumes. New bilateral diffuse mild interstitial edema or infiltrates. Small amount of fluid noted in the interlobar fissures.  Heart size upper limits normal. Coronary stent. No effusion. No pneumothorax. Visualized skeletal structures are unremarkable.  IMPRESSION: 1. Mild bilateral  edema or infiltrates.   Electronically Signed   By: Lucrezia Europe M.D.   On: 12/14/2014 16:44   Dg Chest 2 View  12/12/2014   CLINICAL DATA:  Chest pain  EXAM: CHEST  2 VIEW  COMPARISON:  12/06/2014  FINDINGS: Chronic elevation left hemidiaphragm is unchanged. Negative for heart failure or pneumonia. Heart size and vascularity are normal. Negative for mass lesion. Left coronary stent noted.  IMPRESSION: No active cardiopulmonary disease.   Electronically Signed   By: Franchot Gallo M.D.   On: 12/12/2014 20:41   Dg Chest 2  View  12/01/2014   CLINICAL DATA:  59 year old male with a history of chest pain  EXAM: CHEST - 2 VIEW  COMPARISON:  11/20/2014  FINDINGS: Cardiomediastinal silhouette projects within normal limits in size and contour.  Prominence of the central vasculature with perihilar/peribronchial opacities bilaterally.  No confluent airspace disease.  No pleural effusion.  Low lung volumes with coarsened interstitial markings. Evidence of prior coronary artery stenting.  No displaced fracture.  Unremarkable appearance of the upper abdomen.  IMPRESSION: Low lung volumes with evidence of developing central vascular congestion/early edema. No evidence of lobar pneumonia or pleural effusion.  Changes of prior coronary artery stenting.  Signed,  Dulcy Fanny. Earleen Newport, DO  [facility]  [facility]  Signed,  Dulcy Fanny. Earleen Newport, DO  [facility]  [facility]   Electronically Signed   By: Corrie Mckusick D.O.   On: 12/01/2014 22:24   Dg Chest 2 View  11/20/2014   CLINICAL DATA:  Chest pain today.  Initial encounter.  EXAM: CHEST  2 VIEW  COMPARISON:  11/09/2014  FINDINGS: There is mild stable left hemidiaphragm elevation. Pulmonary vasculature has essentially normalized since the study of 11/09/2014. Lungs are clear. There are no pleural effusions. Hilar, mediastinal and cardiac contours are unremarkable.  IMPRESSION: No active cardiopulmonary disease.   Electronically Signed   By: Andreas Newport M.D.   On: 11/20/2014 23:01   Dg Abd 1 View  12/13/2014   CLINICAL DATA:  Nausea, vomiting common diarrhea off and on for 1 month. Chest pain.  EXAM: ABDOMEN - 1 VIEW  COMPARISON:  None.  FINDINGS: Diffuse gas and stool throughout the colon. Gas within nondistended mid abdominal small bowel. No small or large bowel distention. No radiopaque stones. Vascular calcifications in the pelvis. Visualized bones appear intact except for degenerative change.   IMPRESSION: Normal nonobstructive bowel gas pattern.   Electronically Signed   By: Lucienne Capers M.D.   On: 12/13/2014 00:10   Nm Gastric Emptying  12/13/2014   CLINICAL DATA:  59 year old male with intractable nausea vomiting. Diabetes. Abdominal pain and postprandial bloating. Initial encounter.  EXAM: NUCLEAR MEDICINE GASTRIC EMPTYING SCAN  TECHNIQUE: After oral ingestion of radiolabeled meal, sequential abdominal images were obtained for 4 hours. Percentage of activity emptying the stomach calculated at 1 hour, 2 hour, 3 hour, and 4 hours.  RADIOPHARMACEUTICALS:  2.0 mCi Technetium 99-m labeled sulfur colloid  COMPARISON:  KUB 12/12/2014.  FINDINGS: Expected location of the stomach in the left upper quadrant. Ingested meal empties the stomach gradually over the course of the study.  10% Emptied at 1 hr ( normal >= 10%)  79% emptied at 2 hr ( normal >= 40%)  99% emptied at 3 hr ( normal >= 70%)  100% emptied at 4 hr ( normal >= 90%)  IMPRESSION: Normal gastric emptying study.   Electronically Signed   By: Genevie Ann M.D.   On: 12/13/2014 14:15   Nm Pulmonary Perf And Vent  12/14/2014   CLINICAL DATA:  Hypoxia. History of end-stage renal disease, dialysis, hematochezia, hematuria, coronary artery disease, anemia, CHF  EXAM: NUCLEAR MEDICINE VENTILATION - PERFUSION LUNG SCAN  TECHNIQUE: Ventilation images were obtained in multiple projections using inhaled aerosol technetium 99 M DTPA. Perfusion images were obtained in multiple projections after intravenous injection of Tc-55m MAA.  RADIOPHARMACEUTICALS:  40.0 mCi Tc-7m DTPA aerosol and 6.0 mCi Tc-38m MAA  COMPARISON:  Chest x-ray 12/14/2014  FINDINGS: Ventilation: No focal ventilation defect.  Perfusion: No wedge shaped peripheral perfusion defects to suggest acute pulmonary embolism.  IMPRESSION: Normal V/Q scan.   Electronically Signed   By: Kathreen Devoid   On: 12/14/2014 16:28   Dg Chest Port 1 View  12/06/2014   CLINICAL DATA:  Chest pain and nausea;  mildly short of breath  EXAM: PORTABLE CHEST - 1 VIEW  COMPARISON:  December 01, 2014  FINDINGS: Lungs are clear. Heart size and pulmonary vascularity are normal. No adenopathy. No pneumothorax. No bone lesions.  IMPRESSION: No edema or consolidation.   Electronically Signed   By: Lowella Grip III M.D.   On: 12/06/2014 08:36    Microbiology: Recent Results (from the past 240 hour(s))  MRSA PCR Screening     Status: None   Collection Time: 12/13/14  4:04 AM  Result Value Ref Range Status   MRSA by PCR NEGATIVE NEGATIVE Final    Comment:        The GeneXpert MRSA Assay (FDA approved for NASAL specimens only), is one component of a comprehensive MRSA colonization surveillance program. It is not intended to diagnose MRSA infection nor to guide or monitor treatment for MRSA infections.      Labs: Basic Metabolic Panel:  Recent Labs Lab 12/12/14 2100 12/13/14 0507 12/14/14 0738  NA 140 138 137  K 4.4 4.1 4.4  CL 101 100 99  CO2 27 22 26   GLUCOSE 72 59* 94  BUN 42* 42* 46*  CREATININE 10.86* 11.65* 11.36*  CALCIUM 8.9 8.8 8.8  MG  --  2.2  --   PHOS  --  10.3* 7.8*   Liver Function Tests:  Recent Labs Lab 12/13/14 0507 12/14/14 0738  AST 11  --   ALT 8  --   ALKPHOS 43  --   BILITOT 0.8  --   PROT 6.9  --   ALBUMIN 3.5 3.3*   No results for input(s): LIPASE, AMYLASE in the last 168 hours. No results for input(s): AMMONIA in the last 168 hours. CBC:  Recent Labs Lab 12/12/14 2100 12/13/14 0507 12/14/14 0738  WBC 6.7 5.2 6.6  HGB 10.5* 10.0* 10.0*  HCT 33.4* 31.4* 30.6*  MCV 85.9 84.4 82.0  PLT 250 202 206   Cardiac Enzymes:  Recent Labs Lab 12/12/14 2100 12/13/14 0507 12/13/14 1545 12/14/14 1420  TROPONINI 0.04* 0.03 0.03 0.03   BNP: BNP (last 3 results)  Recent Labs  11/20/14 2245 12/06/14 0845 12/12/14 2100  BNP 75.6 86.9 816.3*    ProBNP (last 3 results) No results for input(s): PROBNP in the last 8760 hours.  CBG:  Recent  Labs Lab 12/14/14 1721 12/14/14 2002 12/14/14 2356 12/15/14 0412 12/15/14 0755  GLUCAP 92 126* 131* 140* 118*       Signed:  Sieanna Vanstone  [facility] 12/15/2014, 10:05 AM

## 2014-12-15 NOTE — Progress Notes (Signed)
Pt discharge instructions and prescriptions given, pt verbalized understanding.  VSS. Denies pain.

## 2014-12-15 NOTE — Care Management Note (Signed)
CARE MANAGEMENT NOTE 12/15/2014  Patient:  JeremiahMartin   Account Number:  [id number]  Date Initiated:  12/15/2014  Documentation initiated by:  Cheyenne Bordeaux  Subjective/Objective Assessment:   CM following for progression and d/c planning.     Action/Plan:   Met with pt no d/c needs identified, CSW assisting pt with transportation.   Anticipated DC Date:  12/15/2014   Anticipated DC Plan:  HOME/SELF CARE         Choice offered to / List presented to:             Status of service:  Completed, signed off Medicare Important Message given?  YES (If response is "NO", the following Medicare IM given date fields will be blank) Date Medicare IM given:  12/15/2014 Medicare IM given by:  Willye Javier Date Additional Medicare IM given:   Additional Medicare IM given by:    Discharge Disposition:  HOME/SELF CARE  Per UR Regulation:    If discussed at Long Length of Stay Meetings, dates discussed:    Comments:

## 2014-12-21 NOTE — Progress Notes (Signed)
erron

## 2014-12-22 ENCOUNTER — Encounter: Payer: Medicare Other | Admitting: Cardiovascular Disease

## 2015-01-05 ENCOUNTER — Encounter: Payer: Self-pay | Admitting: Cardiovascular Disease

## 2015-02-22 ENCOUNTER — Encounter (HOSPITAL_COMMUNITY): Payer: Self-pay | Admitting: Emergency Medicine

## 2015-02-22 DIAGNOSIS — I2510 Atherosclerotic heart disease of native coronary artery without angina pectoris: Secondary | ICD-10-CM | POA: Insufficient documentation

## 2015-02-22 DIAGNOSIS — Z86010 Personal history of colonic polyps: Secondary | ICD-10-CM | POA: Insufficient documentation

## 2015-02-22 DIAGNOSIS — Z862 Personal history of diseases of the blood and blood-forming organs and certain disorders involving the immune mechanism: Secondary | ICD-10-CM | POA: Diagnosis not present

## 2015-02-22 DIAGNOSIS — F508 Other eating disorders: Secondary | ICD-10-CM | POA: Insufficient documentation

## 2015-02-22 DIAGNOSIS — F329 Major depressive disorder, single episode, unspecified: Secondary | ICD-10-CM | POA: Insufficient documentation

## 2015-02-22 DIAGNOSIS — K5909 Other constipation: Secondary | ICD-10-CM | POA: Insufficient documentation

## 2015-02-22 DIAGNOSIS — Z992 Dependence on renal dialysis: Secondary | ICD-10-CM | POA: Insufficient documentation

## 2015-02-22 DIAGNOSIS — I5032 Chronic diastolic (congestive) heart failure: Secondary | ICD-10-CM | POA: Diagnosis not present

## 2015-02-22 DIAGNOSIS — R1011 Right upper quadrant pain: Secondary | ICD-10-CM | POA: Diagnosis present

## 2015-02-22 DIAGNOSIS — Z9889 Other specified postprocedural states: Secondary | ICD-10-CM | POA: Diagnosis not present

## 2015-02-22 DIAGNOSIS — Z7982 Long term (current) use of aspirin: Secondary | ICD-10-CM | POA: Diagnosis not present

## 2015-02-22 DIAGNOSIS — R231 Pallor: Secondary | ICD-10-CM | POA: Insufficient documentation

## 2015-02-22 DIAGNOSIS — Z7902 Long term (current) use of antithrombotics/antiplatelets: Secondary | ICD-10-CM | POA: Diagnosis not present

## 2015-02-22 DIAGNOSIS — N186 End stage renal disease: Secondary | ICD-10-CM | POA: Diagnosis not present

## 2015-02-22 DIAGNOSIS — Z794 Long term (current) use of insulin: Secondary | ICD-10-CM | POA: Diagnosis not present

## 2015-02-22 DIAGNOSIS — Z79899 Other long term (current) drug therapy: Secondary | ICD-10-CM | POA: Diagnosis not present

## 2015-02-22 DIAGNOSIS — I120 Hypertensive chronic kidney disease with stage 5 chronic kidney disease or end stage renal disease: Secondary | ICD-10-CM | POA: Diagnosis not present

## 2015-02-22 NOTE — ED Notes (Signed)
Pt. reports right abdominal pain with nausea and emesis onset this week , denies diarrhea / no fever or chills.  Hemodialysis q Tues/Thurs/Sat.

## 2015-02-23 ENCOUNTER — Emergency Department (HOSPITAL_COMMUNITY): Payer: Medicare Other

## 2015-02-23 ENCOUNTER — Emergency Department (HOSPITAL_COMMUNITY)
Admission: EM | Admit: 2015-02-23 | Discharge: 2015-02-23 | Disposition: A | Discharge status: Home / Self Care | Payer: Medicare Other | Attending: Emergency Medicine | Admitting: Emergency Medicine

## 2015-02-23 DIAGNOSIS — F5089 Other specified eating disorder: Secondary | ICD-10-CM

## 2015-02-23 DIAGNOSIS — R1011 Right upper quadrant pain: Secondary | ICD-10-CM

## 2015-02-23 DIAGNOSIS — K5909 Other constipation: Secondary | ICD-10-CM | POA: Diagnosis not present

## 2015-02-23 LAB — CBC WITH DIFFERENTIAL/PLATELET
Basophils Absolute: 0 10*3/uL (ref 0.0–0.1)
Basophils Relative: 0 % (ref 0–1)
EOS ABS: 0.2 10*3/uL (ref 0.0–0.7)
EOS PCT: 2 % (ref 0–5)
HEMATOCRIT: 31.6 % — AB (ref 39.0–52.0)
HEMOGLOBIN: 10.2 g/dL — AB (ref 13.0–17.0)
LYMPHS ABS: 1.5 10*3/uL (ref 0.7–4.0)
LYMPHS PCT: 22 % (ref 12–46)
MCH: 26.7 pg (ref 26.0–34.0)
MCHC: 32.3 g/dL (ref 30.0–36.0)
MCV: 82.7 fL (ref 78.0–100.0)
Monocytes Absolute: 0.7 10*3/uL (ref 0.1–1.0)
Monocytes Relative: 10 % (ref 3–12)
Neutro Abs: 4.7 10*3/uL (ref 1.7–7.7)
Neutrophils Relative %: 66 % (ref 43–77)
Platelets: 203 10*3/uL (ref 150–400)
RBC: 3.82 MIL/uL — AB (ref 4.22–5.81)
RDW: 16.8 % — ABNORMAL HIGH (ref 11.5–15.5)
WBC: 7.1 10*3/uL (ref 4.0–10.5)

## 2015-02-23 LAB — COMPREHENSIVE METABOLIC PANEL
ALBUMIN: 3.5 g/dL (ref 3.5–5.0)
ALT: 11 U/L — ABNORMAL LOW (ref 17–63)
AST: 12 U/L — ABNORMAL LOW (ref 15–41)
Alkaline Phosphatase: 91 U/L (ref 38–126)
Anion gap: 15 (ref 5–15)
BUN: 32 mg/dL — ABNORMAL HIGH (ref 6–20)
CO2: 29 mmol/L (ref 22–32)
Calcium: 9.2 mg/dL (ref 8.9–10.3)
Chloride: 95 mmol/L — ABNORMAL LOW (ref 101–111)
Creatinine, Ser: 6.66 mg/dL — ABNORMAL HIGH (ref 0.61–1.24)
GFR calc Af Amer: 10 mL/min — ABNORMAL LOW (ref >60)
GFR calc non Af Amer: 8 mL/min — ABNORMAL LOW (ref >60)
GLUCOSE: 175 mg/dL — AB (ref 65–99)
POTASSIUM: 3.6 mmol/L (ref 3.5–5.1)
Sodium: 139 mmol/L (ref 135–145)
Total Bilirubin: 0.4 mg/dL (ref 0.3–1.2)
Total Protein: 6.9 g/dL (ref 6.5–8.1)

## 2015-02-23 LAB — URINALYSIS, ROUTINE W REFLEX MICROSCOPIC
Bilirubin Urine: NEGATIVE
GLUCOSE, UA: 250 mg/dL — AB
Ketones, ur: NEGATIVE mg/dL
Nitrite: NEGATIVE
Protein, ur: >300 mg/dL — AB
Specific Gravity, Urine: 1.017 (ref 1.005–1.030)
Urobilinogen, UA: 0.2 mg/dL (ref 0.0–1.0)
pH: 8 (ref 5.0–8.0)

## 2015-02-23 LAB — URINE MICROSCOPIC-ADD ON

## 2015-02-23 LAB — LIPASE, BLOOD: Lipase: 34 U/L (ref 22–51)

## 2015-02-23 MED ORDER — IOHEXOL 300 MG/ML  SOLN
25.0000 mL | Freq: Once | INTRAMUSCULAR | Status: AC | PRN
  Administered 2015-02-23: 25 mL via ORAL

## 2015-02-23 MED ORDER — IOHEXOL 300 MG/ML  SOLN
100.0000 mL | Freq: Once | INTRAMUSCULAR | Status: AC | PRN
  Administered 2015-02-23: 100 mL via INTRAVENOUS

## 2015-02-23 NOTE — ED Provider Notes (Signed)
CSN: [id number]     Arrival date & time 02/22/15  2332 History  This chart was scribed for Rolland Porter, MD by Pricilla Loveless, ED Scribe. This patient was seen in room A06C/A06C and the patient's care was started at 12:40 AM.     Chief Complaint  Patient presents with  . Abdominal Pain      The history is provided by the patient. No language interpreter was used.    HPI Comments: [name] [name] is a 59 y.o. male  Dialysis patient with HTN and DM who presents to the Emergency Department complaining of sharp, constant, waxing and waning RUQ abdominal pain radiating to his lower mid-abdomen beginning 2 days ago. 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 He states that he has been eating it constantly, and has been buying a new box every other day for approximately the past four months. He states he doesn't like polygrip. The patient lists nausea with vomiting tonight, loss of appetite and hasn't eaten in 2 days, and weight loss (from 204 to 191 pounds; 270 last year) as associated symptoms. He states that he had a dialysis appointment today ([id number]) with no complications PTA. The patient denies diarrhea, numbness, SOB, any new CP, and tingling as associated symptoms. The patient claims that he does not smoke and has not drank in 8-9 years  Patient has end-stage renal disease and gets dialysis on Tuesday (today), Thursday, and Saturday. He describes the pain as a vibration pain. He states if he lays on his side the pain is sharp and like a spasm. He states nothing makes it feel better. Patient complains of his typical chronic chest pain.   Past Medical History  Diagnosis Date  . Hypertension   . ESRD on dialysis   . Diabetes mellitus with nephropathy   . Hematochezia     a. 2014: colonscopy, which showed moderately-sized internal hemorrhoids, two 71mm polyps in transverse colon and ascending colon that were  resected, five 2-54mm polyps in sigmoid colon, descending colon, transverse colon, and ascending colon that were resected. An upper endoscopy was performed and showed normal esophagus, stomach, and duodenum.  . Hematuria     a. H/o hematuria 2014 with cystoscopy that was unrevealing for his source of hematuria. He underwent a kidney ultrasound on 10/14 that showed mildly echogenic and scarred kidneys compatible with medical renal disease, without hydronephrosis or renal calculi.  [name] Anemia   . Depression   . CAD (coronary artery disease)     a. per [facility] s/p BMS to mid LAD 12/2009 and DES to mid LAD 10/2010.  . Colon polyps   . Chronic diastolic CHF (congestive heart failure)    Past Surgical History  Procedure Laterality Date  . Left heart catheterization with coronary angiogram N/A 11/23/2014    Procedure: LEFT HEART CATHETERIZATION WITH CORONARY ANGIOGRAM;  Surgeon: Troy Sine, MD;  Location: [facility] CATH LAB;  Service: Cardiovascular;  Laterality: N/A;   Family History  Problem Relation Age of Onset  . Hypertension    . Bone cancer Mother   . Anuerysm Father   . Diabetes type II Daughter    History  Substance Use Topics  . Smoking status: Former Smoker    Quit date: 12/06/2010  . Smokeless tobacco: Never Used  . Alcohol Use: No  lives with his son Quit drinking 9 years ago  Review of Systems  Constitutional: Positive  for appetite change (loss) and unexpected weight change (loss).  Respiratory: Negative for shortness of breath.   Cardiovascular: Negative for chest pain.  Gastrointestinal: Positive for nausea, vomiting and abdominal pain. Negative for diarrhea.  Neurological: Negative for numbness.  All other systems reviewed and are negative.     Allergies  Enalapril  Home Medications   Prior to Admission medications   Medication Sig Start Date End Date Taking? Authorizing Provider  acetaminophen (TYLENOL) 500 MG tablet Take 500 mg by mouth every 6 (six) hours  as needed for headache.    Yes Historical Provider, MD  amitriptyline (ELAVIL) 100 MG tablet Take 1 tablet (100 mg total) by mouth at bedtime. 12/15/14  Yes Jessica U Vann, DO  amLODipine (NORVASC) 10 MG tablet Take 10 mg by mouth daily.   Yes Historical Provider, MD  aspirin EC 81 MG tablet Take 81 mg by mouth daily.   Yes Historical Provider, MD  atorvastatin (LIPITOR) 20 MG tablet Take 20 mg by mouth daily.   Yes Historical Provider, MD  calcium acetate (PHOSLO) 667 MG capsule Take 2 capsules (1,334 mg total) by mouth 3 (three) times daily with meals. 11/10/14  Yes Juliet Rude, MD  carvedilol (COREG) 25 MG tablet Take 50 mg by mouth 2 (two) times daily.    Yes Historical Provider, MD  cinacalcet (SENSIPAR) 90 MG tablet Take 90 mg by mouth daily.   Yes Historical Provider, MD  clopidogrel (PLAVIX) 75 MG tablet Take 1 tablet (75 mg total) by mouth daily. 11/24/14  Yes Juluis Mire, MD  Darbepoetin Alfa (ARANESP) 25 MCG/0.42ML SOSY injection Inject 0.42 mLs (25 mcg total) into the vein every Tuesday with hemodialysis. 12/15/14  Yes Geradine Girt, DO  doxercalciferol (HECTOROL) 4 MCG/2ML injection Inject 1 mL (2 mcg total) into the vein Every Tuesday,Thursday,and Saturday with dialysis. 12/15/14  Yes Geradine Girt, DO  ferric gluconate 62.5 mg in sodium chloride 0.9 % 100 mL Inject 62.5 mg into the vein every Thursday with hemodialysis. 12/16/14  Yes Geradine Girt, DO  finasteride (PROSCAR) 5 MG tablet Take 5 mg by mouth daily.   Yes Historical Provider, MD  insulin aspart (NOVOLOG) 100 UNIT/ML injection Inject 15-20 Units into the skin 3 (three) times daily as needed for high blood sugar (CBG >150).    Yes Historical Provider, MD  insulin glargine (LANTUS) 100 UNIT/ML injection Inject 10 Units into the skin at bedtime.   Yes Historical Provider, MD  Ipratropium-Albuterol (COMBIVENT RESPIMAT) 20-100 MCG/ACT AERS respimat Inhale 2 puffs into the lungs every 6 (six) hours as needed for wheezing.   Yes  Historical Provider, MD  meclizine (ANTIVERT) 25 MG tablet Take 25 mg by mouth 3 (three) times daily as needed for dizziness.   Yes Historical Provider, MD  multivitamin (RENA-VIT) TABS tablet Take 1 tablet by mouth at bedtime. 12/15/14  Yes Jessica U Vann, DO  ondansetron (ZOFRAN) 4 MG tablet Take 1 tablet (4 mg total) by mouth every 6 (six) hours. Patient taking differently: Take 4 mg by mouth every 6 (six) hours as needed for nausea or vomiting.  12/02/14  Yes Britt Bottom, NP  pantoprazole (PROTONIX) 40 MG tablet Take 1 tablet (40 mg total) by mouth daily. 11/24/14  Yes Juluis Mire, MD  pregabalin (LYRICA) 50 MG capsule Take 1 capsule (50 mg total) by mouth daily. 12/15/14  Yes Geradine Girt, DO  ranolazine (RANEXA) 500 MG 12 hr tablet Take 1 tablet (500 mg total) by mouth 2 (two)  times daily. 11/24/14  Yes Juluis Mire, MD  sevelamer carbonate (RENVELA) 800 MG tablet Take 1,600-2,400 mg by mouth 3 (three) times daily with meals. Pt takes 3 capsules with breakfast, 2 capsules with lunch, and 3 capsules with dinner.   Yes Historical Provider, MD  sucralfate (CARAFATE) 1 GM/10ML suspension Take 10 mLs (1 g total) by mouth 4 (four) times daily -  with meals and at bedtime. 12/15/14  Yes Geradine Girt, DO  venlafaxine (EFFEXOR) 37.5 MG tablet Take 37.5 mg by mouth 2 (two) times daily.   Yes Historical Provider, MD  pregabalin (LYRICA) 50 MG capsule Take 1 capsule (50 mg total) by mouth every Tuesday, Thursday, and Saturday at 6 PM. 12/16/14   Geradine Girt, DO   BP 156/85 mmHg  Pulse 96  Temp(Src) 98.5 F (36.9 C) (Oral)  Resp 14  Ht 6' (1.829 m)  Wt 191 lb (86.637 kg)  BMI 25.90 kg/m2  SpO2 98%  Vital signs normal   Physical Exam  Constitutional: He is oriented to person, place, and time. He appears well-developed and well-nourished.  Non-toxic appearance. He does not appear ill. No distress.  HENT:  Head: Normocephalic and atraumatic.  Right Ear: External ear normal.  Left Ear:  External ear normal.  Nose: Nose normal. No mucosal edema or rhinorrhea.  Mouth/Throat: Oropharynx is clear and moist and mucous membranes are normal. No dental abscesses or uvula swelling.  Eyes: Conjunctivae and EOM are normal. Pupils are equal, round, and reactive to light.  Neck: Normal range of motion and full passive range of motion without pain. Neck supple.  Cardiovascular: Normal rate, regular rhythm and normal heart sounds.  Exam reveals no gallop and no friction rub.   No murmur heard. Pulmonary/Chest: Effort normal and breath sounds normal. No respiratory distress. He has no wheezes. He has no rhonchi. He has no rales. He exhibits no tenderness and no crepitus.  Abdominal: Soft. Normal appearance and bowel sounds are normal. He exhibits no distension. There is tenderness in the right upper quadrant. There is no rebound and no guarding.  Musculoskeletal: Normal range of motion. He exhibits no edema or tenderness.  Moves all extremities well.   Neurological: He is alert and oriented to person, place, and time. He has normal strength. No cranial nerve deficit.  Skin: Skin is warm, dry and intact. No rash noted. No erythema. There is pallor.  Psychiatric: He has a normal mood and affect. His speech is normal and behavior is normal. His mood appears not anxious.  Nursing note and vitals reviewed.   ED Course  Procedures (including critical care time)  Medications  iohexol (OMNIPAQUE) 300 MG/ML solution 25 mL (25 mLs Oral Contrast Given 02/23/15 0131)  iohexol (OMNIPAQUE) 300 MG/ML solution 100 mL (100 mLs Intravenous Contrast Given 02/23/15 0246)    DIAGNOSTIC STUDIES: Oxygen Saturation is 98% on RA, Normal by my interpretation.    COORDINATION OF CARE: 12:49 AM Discussed treatment plan at bedside including researching ingredients of Fixodent. Pt agreed to plan.   00:55 AM patient discussed with Shanon Brow at poison control. He states he cannot find anything stating the fixodent is  addictive. There is some concern of zinc toxicity with low copper when I look on the Internet. It also can cause severe neuropathy including paralysis requiring patients to be in a wheelchair.  03:30  patient was given CT results and is agreeable to have his gallbladder further evaluated  At time of discharge patient was given the result  of his ultrasound which did not show any significant gallbladder disease. When I review his CT scan patient is noted to have a moderate amount of stool in his right colon and I suspect the patient is constipated from his large amount of fixodent ingestion. Patient was advised to take Mira lax to relieve his constipation and strongly advised to stop ingestion of the fixodent.   Labs Review Results for orders placed or performed during the hospital encounter of 02/23/15  CBC with Differential  Result Value Ref Range   WBC 7.1 4.0 - 10.5 K/uL   RBC 3.82 (L) 4.22 - 5.81 MIL/uL   Hemoglobin 10.2 (L) 13.0 - 17.0 g/dL   HCT 31.6 (L) 39.0 - 52.0 %   MCV 82.7 78.0 - 100.0 fL   MCH 26.7 26.0 - 34.0 pg   MCHC 32.3 30.0 - 36.0 g/dL   RDW 16.8 (H) 11.5 - 15.5 %   Platelets 203 150 - 400 K/uL   Neutrophils Relative % 66 43 - 77 %   Neutro Abs 4.7 1.7 - 7.7 K/uL   Lymphocytes Relative 22 12 - 46 %   Lymphs Abs 1.5 0.7 - 4.0 K/uL   Monocytes Relative 10 3 - 12 %   Monocytes Absolute 0.7 0.1 - 1.0 K/uL   Eosinophils Relative 2 0 - 5 %   Eosinophils Absolute 0.2 0.0 - 0.7 K/uL   Basophils Relative 0 0 - 1 %   Basophils Absolute 0.0 0.0 - 0.1 K/uL  Comprehensive metabolic panel  Result Value Ref Range   Sodium 139 135 - 145 mmol/L   Potassium 3.6 3.5 - 5.1 mmol/L   Chloride 95 (L) 101 - 111 mmol/L   CO2 29 22 - 32 mmol/L   Glucose, Bld 175 (H) 65 - 99 mg/dL   BUN 32 (H) 6 - 20 mg/dL   Creatinine, Ser 6.66 (H) 0.61 - 1.24 mg/dL   Calcium 9.2 8.9 - 10.3 mg/dL   Total Protein 6.9 6.5 - 8.1 g/dL   Albumin 3.5 3.5 - 5.0 g/dL   AST 12 (L) 15 - 41 U/L   ALT 11 (L)  17 - 63 U/L   Alkaline Phosphatase 91 38 - 126 U/L   Total Bilirubin 0.4 0.3 - 1.2 mg/dL   GFR calc non Af Amer 8 (L) >60 mL/min   GFR calc Af Amer 10 (L) >60 mL/min   Anion gap 15 5 - 15  Lipase, blood  Result Value Ref Range   Lipase 34 22 - 51 U/L  Urinalysis, Routine w reflex microscopic  Result Value Ref Range   Color, Urine YELLOW YELLOW   APPearance CLEAR CLEAR   Specific Gravity, Urine 1.017 1.005 - 1.030   pH 8.0 5.0 - 8.0   Glucose, UA 250 (A) NEGATIVE mg/dL   Hgb urine dipstick MODERATE (A) NEGATIVE   Bilirubin Urine NEGATIVE NEGATIVE   Ketones, ur NEGATIVE NEGATIVE mg/dL   Protein, ur >300 (A) NEGATIVE mg/dL   Urobilinogen, UA 0.2 0.0 - 1.0 mg/dL   Nitrite NEGATIVE NEGATIVE   Leukocytes, UA SMALL (A) NEGATIVE  Urine microscopic-add on  Result Value Ref Range   Squamous Epithelial / LPF FEW (A) RARE   WBC, UA 11-20 <3 WBC/hpf   RBC / HPF 3-6 <3 RBC/hpf   Bacteria, UA FEW (A) RARE   Urine-Other LESS THAN 10 mL OF URINE SUBMITTED    Laboratory interpretation all normal except renal disease     Imaging Review Ct Abdomen Pelvis  W Contrast  02/23/2015   CLINICAL DATA:  Acute onset of right upper quadrant abdominal pain radiating to the umbilicus. Recent dialysis. Nausea and vomiting. Initial encounter.  EXAM: CT ABDOMEN AND PELVIS WITH CONTRAST  TECHNIQUE: Multidetector CT imaging of the abdomen and pelvis was performed using the standard protocol following bolus administration of intravenous contrast.  CONTRAST:  190mL OMNIPAQUE IOHEXOL 300 MG/ML  SOLN  COMPARISON:  None.  FINDINGS: A nonspecific mildly mosaic pattern of parenchymal attenuation is noted at the lung bases. Calcification is noted at the mitral valve.  The liver and spleen are unremarkable in appearance. Mild soft tissue inflammation is noted about the gallbladder. This could reflect mild cholecystitis. Would correlate for associated symptoms. The pancreas and adrenal glands are unremarkable.  Moderate  bilateral renal atrophy is noted, with underlying scattered renal cysts and nonspecific bilateral perinephric stranding. There is no evidence of hydronephrosis. No renal or ureteral stones are seen.  No free fluid is identified. The small bowel is unremarkable in appearance. The stomach is within normal limits. No acute vascular abnormalities are seen. Mild calcification is noted along the abdominal aorta and its branches.  The appendix is normal in caliber and contains trace air, without evidence for appendicitis. The colon is partially filled with stool and is unremarkable in appearance.  The bladder is decompressed and not well assessed. The prostate is borderline normal in size. No inguinal lymphadenopathy is seen.  No acute osseous abnormalities are identified.  IMPRESSION: 1. Mild apparent soft tissue inflammation about the gallbladder. This could reflect mild cholecystitis. Would correlate for associated symptoms. Right upper quadrant ultrasound could be considered for further evaluation, as deemed clinically appropriate. 2. Moderate bilateral renal atrophy, with underlying scattered renal cysts. 3. Nonspecific mildly mosaic pattern of parenchymal attenuation at the lung bases. 4. Calcification at the mitral valve. 5. Mild calcification along the abdominal aorta and its branches.   Electronically Signed   By: Garald Balding M.D.   On: 02/23/2015 03:11   US Abdomen Limited Ruq  02/23/2015   CLINICAL DATA:  Initial evaluation for acute right upper quadrant abdominal pain  EXAM: US ABDOMEN LIMITED - RIGHT UPPER QUADRANT  COMPARISON:  CT performed earlier on the same day  FINDINGS: Gallbladder:  No gallstones or wall thickening visualized. No sonographic Murphy sign noted.  Common bile duct:  Diameter: 3.6 mm  Liver:  No focal lesion identified. Within normal limits in parenchymal echogenicity.  IMPRESSION: No sonographic evidence for cholelithiasis, acute cholecystitis, or biliary dilatation.   Electronically  Signed   By: Jeannine Boga M.D.   On: 02/23/2015 04:14     EKG Interpretation None      MDM   Final diagnoses:  RUQ pain  Pica in adults  Other constipation    Discharge medications miralax  Plan discharge  Rolland Porter, MD, FACEP   I personally performed the services described in this documentation, which was scribed in my presence. The recorded information has been reviewed and considered.    Rolland Porter, MD 02/23/15 [phone]

## 2015-02-23 NOTE — Discharge Instructions (Signed)
I looked at your CT scan and you have a lot of stool in your right colon. You are probably constipated from eating all the fixodent. STOP EATING THE FIXODENT!!! Get miralax and put one dose or 17 g in 8 ounces of water,  take 1 dose every 30 minutes for 2-3 hours or until you  get good results and then once or twice daily to prevent constipation.

## 2015-03-31 ENCOUNTER — Emergency Department (HOSPITAL_COMMUNITY): Payer: Medicare Other

## 2015-03-31 ENCOUNTER — Encounter (HOSPITAL_COMMUNITY): Payer: Self-pay | Admitting: Emergency Medicine

## 2015-03-31 ENCOUNTER — Observation Stay (HOSPITAL_COMMUNITY)
Admission: EM | Admit: 2015-03-31 | Discharge: 2015-04-03 | Disposition: A | Discharge status: Home / Self Care | Payer: Medicare Other | Attending: Internal Medicine | Admitting: Internal Medicine

## 2015-03-31 DIAGNOSIS — F329 Major depressive disorder, single episode, unspecified: Secondary | ICD-10-CM | POA: Insufficient documentation

## 2015-03-31 DIAGNOSIS — Z9861 Coronary angioplasty status: Secondary | ICD-10-CM | POA: Insufficient documentation

## 2015-03-31 DIAGNOSIS — N186 End stage renal disease: Secondary | ICD-10-CM | POA: Diagnosis present

## 2015-03-31 DIAGNOSIS — G459 Transient cerebral ischemic attack, unspecified: Secondary | ICD-10-CM | POA: Diagnosis present

## 2015-03-31 DIAGNOSIS — I5032 Chronic diastolic (congestive) heart failure: Secondary | ICD-10-CM | POA: Diagnosis not present

## 2015-03-31 DIAGNOSIS — Z888 Allergy status to other drugs, medicaments and biological substances status: Secondary | ICD-10-CM | POA: Insufficient documentation

## 2015-03-31 DIAGNOSIS — R531 Weakness: Secondary | ICD-10-CM | POA: Diagnosis present

## 2015-03-31 DIAGNOSIS — I120 Hypertensive chronic kidney disease with stage 5 chronic kidney disease or end stage renal disease: Secondary | ICD-10-CM | POA: Diagnosis not present

## 2015-03-31 DIAGNOSIS — Z7982 Long term (current) use of aspirin: Secondary | ICD-10-CM | POA: Insufficient documentation

## 2015-03-31 DIAGNOSIS — I5189 Other ill-defined heart diseases: Secondary | ICD-10-CM | POA: Diagnosis present

## 2015-03-31 DIAGNOSIS — Z794 Long term (current) use of insulin: Secondary | ICD-10-CM | POA: Insufficient documentation

## 2015-03-31 DIAGNOSIS — E785 Hyperlipidemia, unspecified: Secondary | ICD-10-CM | POA: Diagnosis not present

## 2015-03-31 DIAGNOSIS — Z87891 Personal history of nicotine dependence: Secondary | ICD-10-CM | POA: Insufficient documentation

## 2015-03-31 DIAGNOSIS — I10 Essential (primary) hypertension: Secondary | ICD-10-CM | POA: Diagnosis present

## 2015-03-31 DIAGNOSIS — R112 Nausea with vomiting, unspecified: Secondary | ICD-10-CM | POA: Diagnosis present

## 2015-03-31 DIAGNOSIS — I2510 Atherosclerotic heart disease of native coronary artery without angina pectoris: Secondary | ICD-10-CM | POA: Diagnosis not present

## 2015-03-31 DIAGNOSIS — J449 Chronic obstructive pulmonary disease, unspecified: Secondary | ICD-10-CM | POA: Diagnosis not present

## 2015-03-31 DIAGNOSIS — E8770 Fluid overload, unspecified: Secondary | ICD-10-CM | POA: Diagnosis present

## 2015-03-31 DIAGNOSIS — Z992 Dependence on renal dialysis: Secondary | ICD-10-CM | POA: Diagnosis not present

## 2015-03-31 DIAGNOSIS — E1122 Type 2 diabetes mellitus with diabetic chronic kidney disease: Secondary | ICD-10-CM | POA: Insufficient documentation

## 2015-03-31 DIAGNOSIS — K219 Gastro-esophageal reflux disease without esophagitis: Secondary | ICD-10-CM | POA: Insufficient documentation

## 2015-03-31 LAB — CBC WITH DIFFERENTIAL/PLATELET
BASOS ABS: 0 10*3/uL (ref 0.0–0.1)
BASOS PCT: 0 % (ref 0–1)
Eosinophils Absolute: 0.2 10*3/uL (ref 0.0–0.7)
Eosinophils Relative: 2 % (ref 0–5)
HCT: 34.7 % — ABNORMAL LOW (ref 39.0–52.0)
HEMOGLOBIN: 10.9 g/dL — AB (ref 13.0–17.0)
Lymphocytes Relative: 15 % (ref 12–46)
Lymphs Abs: 1.3 10*3/uL (ref 0.7–4.0)
MCH: 25.8 pg — ABNORMAL LOW (ref 26.0–34.0)
MCHC: 31.4 g/dL (ref 30.0–36.0)
MCV: 82 fL (ref 78.0–100.0)
MONOS PCT: 7 % (ref 3–12)
Monocytes Absolute: 0.6 10*3/uL (ref 0.1–1.0)
NEUTROS ABS: 6.9 10*3/uL (ref 1.7–7.7)
Neutrophils Relative %: 77 % (ref 43–77)
Platelets: 189 10*3/uL (ref 150–400)
RBC: 4.23 MIL/uL (ref 4.22–5.81)
RDW: 18.4 % — ABNORMAL HIGH (ref 11.5–15.5)
WBC: 9.1 10*3/uL (ref 4.0–10.5)

## 2015-03-31 LAB — COMPREHENSIVE METABOLIC PANEL
ALT: 12 U/L — ABNORMAL LOW (ref 17–63)
ANION GAP: 16 — AB (ref 5–15)
AST: 12 U/L — ABNORMAL LOW (ref 15–41)
Albumin: 3.7 g/dL (ref 3.5–5.0)
Alkaline Phosphatase: 78 U/L (ref 38–126)
BUN: 52 mg/dL — AB (ref 6–20)
CALCIUM: 10 mg/dL (ref 8.9–10.3)
CO2: 27 mmol/L (ref 22–32)
Chloride: 97 mmol/L — ABNORMAL LOW (ref 101–111)
Creatinine, Ser: 11.42 mg/dL — ABNORMAL HIGH (ref 0.61–1.24)
GFR calc non Af Amer: 4 mL/min — ABNORMAL LOW (ref >60)
GFR, EST AFRICAN AMERICAN: 5 mL/min — AB (ref >60)
Glucose, Bld: 106 mg/dL — ABNORMAL HIGH (ref 65–99)
Potassium: 4.5 mmol/L (ref 3.5–5.1)
Sodium: 140 mmol/L (ref 135–145)
TOTAL PROTEIN: 7.4 g/dL (ref 6.5–8.1)
Total Bilirubin: 0.7 mg/dL (ref 0.3–1.2)

## 2015-03-31 MED ORDER — LABETALOL HCL 5 MG/ML IV SOLN
10.0000 mg | Freq: Once | INTRAVENOUS | Status: AC
  Administered 2015-04-01: 10 mg via INTRAVENOUS
  Filled 2015-03-31: qty 4

## 2015-03-31 MED ORDER — SODIUM CHLORIDE 0.9 % IV BOLUS (SEPSIS)
500.0000 mL | Freq: Once | INTRAVENOUS | Status: AC
  Administered 2015-04-01: 500 mL via INTRAVENOUS

## 2015-03-31 NOTE — ED Provider Notes (Signed)
CSN: [id number]     Arrival date & time 03/31/15  2220 History   First MD Initiated Contact with Patient 03/31/15 2324     This chart was scribed for Linton Flemings, MD by Forrestine Him, ED Scribe. This patient was seen in room B14C/B14C and the patient's care was started 11:29 PM.   Chief Complaint  Patient presents with  . Weakness  . Fatigue  . Generalized Body Aches   The history is provided by the patient.    HPI Comments: [name] [name] is a 59 y.o. male with a PMHx of HTN, ESRD, DM, CAD, and CHF who presents to the Emergency Department complaining of constant, ongoing, unchanged generalized body aches onset early this morning after waking from sleep. Pt also reports ongoing fatigue, HA, intermittent "stiffness" to R shoulder, and worsening weakness. [name] [name] admits he skipped his hemodialysis treatment today due to L arm and L leg weakness. States "I could hardly get up and i almost fell today". He is eating and drinking as normal. No recent fever, chills, shortness of breath, chest pain, or vomiting. No personal history of stroke but admits to a family history-Father. Pt with known allergy to Enalapril.   He is followed by [facility]  Past Medical History  Diagnosis Date  . Hypertension   . ESRD on dialysis   . Diabetes mellitus with nephropathy   . Hematochezia     a. 2014: colonscopy, which showed moderately-sized internal hemorrhoids, two 48mm polyps in transverse colon and ascending colon that were resected, five 2-66mm polyps in sigmoid colon, descending colon, transverse colon, and ascending colon that were resected. An upper endoscopy was performed and showed normal esophagus, stomach, and duodenum.  . Hematuria     a. H/o hematuria 2014 with cystoscopy that was unrevealing for his source of hematuria. He underwent a kidney ultrasound on 10/14 that showed mildly echogenic and scarred kidneys compatible with medical  renal disease, without hydronephrosis or renal calculi.  [name] Anemia   . Depression   . CAD (coronary artery disease)     a. per [facility] s/p BMS to mid LAD 12/2009 and DES to mid LAD 10/2010.  . Colon polyps   . Chronic diastolic CHF (congestive heart failure)    Past Surgical History  Procedure Laterality Date  . Left heart catheterization with coronary angiogram N/A 11/23/2014    Procedure: LEFT HEART CATHETERIZATION WITH CORONARY ANGIOGRAM;  Surgeon: Troy Sine, MD;  Location: [facility] CATH LAB;  Service: Cardiovascular;  Laterality: N/A;   Family History  Problem Relation Age of Onset  . Hypertension    . Bone cancer Mother   . Anuerysm Father   . Diabetes type II Daughter    History  Substance Use Topics  . Smoking status: Former Smoker    Quit date: 12/06/2010  . Smokeless tobacco: Never Used  . Alcohol Use: No    Review of Systems  Constitutional: Positive for fatigue. Negative for fever and chills.  Respiratory: Negative for cough and shortness of breath.   Cardiovascular: Negative for chest pain.  Gastrointestinal: Positive for nausea. Negative for vomiting.  Musculoskeletal: Positive for arthralgias.  Skin: Negative for rash.  Neurological: Positive for weakness and headaches. Negative for numbness.  Psychiatric/Behavioral: Negative for confusion.  All other systems reviewed and are negative.     Allergies  Enalapril  Home Medications   Prior to Admission medications   Medication Sig Start Date End Date  Taking? Authorizing Provider  acetaminophen (TYLENOL) 500 MG tablet Take 500 mg by mouth every 6 (six) hours as needed for headache.     Historical Provider, MD  amitriptyline (ELAVIL) 100 MG tablet Take 1 tablet (100 mg total) by mouth at bedtime. 12/15/14   Geradine Girt, DO  amLODipine (NORVASC) 10 MG tablet Take 10 mg by mouth daily.    Historical Provider, MD  aspirin EC 81 MG tablet Take 81 mg by mouth daily.    Historical Provider, MD  atorvastatin  (LIPITOR) 20 MG tablet Take 20 mg by mouth daily.    Historical Provider, MD  calcium acetate (PHOSLO) 667 MG capsule Take 2 capsules (1,334 mg total) by mouth 3 (three) times daily with meals. 11/10/14   Juliet Rude, MD  carvedilol (COREG) 25 MG tablet Take 50 mg by mouth 2 (two) times daily.     Historical Provider, MD  cinacalcet (SENSIPAR) 90 MG tablet Take 90 mg by mouth daily.    Historical Provider, MD  clopidogrel (PLAVIX) 75 MG tablet Take 1 tablet (75 mg total) by mouth daily. 11/24/14   Juluis Mire, MD  Darbepoetin Alfa (ARANESP) 25 MCG/0.42ML SOSY injection Inject 0.42 mLs (25 mcg total) into the vein every Tuesday with hemodialysis. 12/15/14   Geradine Girt, DO  doxercalciferol (HECTOROL) 4 MCG/2ML injection Inject 1 mL (2 mcg total) into the vein Every Tuesday,Thursday,and Saturday with dialysis. 12/15/14   Geradine Girt, DO  ferric gluconate 62.5 mg in sodium chloride 0.9 % 100 mL Inject 62.5 mg into the vein every Thursday with hemodialysis. 12/16/14   Geradine Girt, DO  finasteride (PROSCAR) 5 MG tablet Take 5 mg by mouth daily.    Historical Provider, MD  insulin aspart (NOVOLOG) 100 UNIT/ML injection Inject 15-20 Units into the skin 3 (three) times daily as needed for high blood sugar (CBG >150).     Historical Provider, MD  insulin glargine (LANTUS) 100 UNIT/ML injection Inject 10 Units into the skin at bedtime.    Historical Provider, MD  Ipratropium-Albuterol (COMBIVENT RESPIMAT) 20-100 MCG/ACT AERS respimat Inhale 2 puffs into the lungs every 6 (six) hours as needed for wheezing.    Historical Provider, MD  meclizine (ANTIVERT) 25 MG tablet Take 25 mg by mouth 3 (three) times daily as needed for dizziness.    Historical Provider, MD  multivitamin (RENA-VIT) TABS tablet Take 1 tablet by mouth at bedtime. 12/15/14   Geradine Girt, DO  ondansetron (ZOFRAN) 4 MG tablet Take 1 tablet (4 mg total) by mouth every 6 (six) hours. Patient taking differently: Take 4 mg by mouth every 6  (six) hours as needed for nausea or vomiting.  12/02/14   Britt Bottom, NP  pantoprazole (PROTONIX) 40 MG tablet Take 1 tablet (40 mg total) by mouth daily. 11/24/14   Juluis Mire, MD  pregabalin (LYRICA) 50 MG capsule Take 1 capsule (50 mg total) by mouth daily. 12/15/14   Geradine Girt, DO  pregabalin (LYRICA) 50 MG capsule Take 1 capsule (50 mg total) by mouth every Tuesday, Thursday, and Saturday at 6 PM. 12/16/14   Geradine Girt, DO  ranolazine (RANEXA) 500 MG 12 hr tablet Take 1 tablet (500 mg total) by mouth 2 (two) times daily. 11/24/14   Juluis Mire, MD  sevelamer carbonate (RENVELA) 800 MG tablet Take 1,600-2,400 mg by mouth 3 (three) times daily with meals. Pt takes 3 capsules with breakfast, 2 capsules with lunch, and 3 capsules with dinner.  Historical Provider, MD  sucralfate (CARAFATE) 1 GM/10ML suspension Take 10 mLs (1 g total) by mouth 4 (four) times daily -  with meals and at bedtime. 12/15/14   Geradine Girt, DO  venlafaxine (EFFEXOR) 37.5 MG tablet Take 37.5 mg by mouth 2 (two) times daily.    Historical Provider, MD   Triage Vitals: BP 194/111 mmHg  Pulse 108  Temp(Src) 97.9 F (36.6 C) (Oral)  Resp 16  Ht 6' (1.829 m)  Wt 203 lb (92.08 kg)  BMI 27.53 kg/m2  SpO2 96%   Physical Exam  Constitutional: He is oriented to person, place, and time. He appears well-developed and well-nourished.  HENT:  Head: Normocephalic and atraumatic.  Nose: Nose normal.  Mouth/Throat: Oropharynx is clear and moist.  Eyes: Conjunctivae and EOM are normal. Pupils are equal, round, and reactive to light.  Neck: Normal range of motion.  Cardiovascular: Normal rate, regular rhythm, normal heart sounds and intact distal pulses.   Pulmonary/Chest: Effort normal and breath sounds normal. No respiratory distress. He has no wheezes. He has no rales. He exhibits no tenderness.  Abdominal: Soft. Bowel sounds are normal. He exhibits no distension and no mass. There is no tenderness. There  is no rebound and no guarding.  Musculoskeletal: Normal range of motion. He exhibits tenderness (pain with ROM of R shoulder). He exhibits no edema.  Neurological: He is alert and oriented to person, place, and time. He has normal reflexes. He displays normal reflexes. No cranial nerve deficit. He exhibits abnormal muscle tone. Coordination abnormal.  Strength 3/5 left lower extremity, 2/5 left upper extremity.  Graft in LUE, thrill present  Skin: Skin is warm and dry. No rash noted. No erythema. No pallor.  Psychiatric: He has a normal mood and affect. His behavior is normal. Thought content normal.  Nursing note and vitals reviewed.   ED Course  Procedures (including critical care time)  DIAGNOSTIC STUDIES: Oxygen Saturation is 96% on RA , adequate by my interpretation.    COORDINATION OF CARE: 11:30 PM- Will give fluids and labetalol. Will order CT head without contrast, CXR, i-stat troponin, CK, CBC, and CMP. ussed treatment plan with pt at bedside and pt agreed to plan.     Labs Review Labs Reviewed  CBC WITH DIFFERENTIAL/PLATELET - Abnormal; Notable for the following:    Hemoglobin 10.9 (*)    HCT 34.7 (*)    MCH 25.8 (*)    RDW 18.4 (*)    All other components within normal limits  COMPREHENSIVE METABOLIC PANEL - Abnormal; Notable for the following:    Chloride 97 (*)    Glucose, Bld 106 (*)    BUN 52 (*)    Creatinine, Ser 11.42 (*)    AST 12 (*)    ALT 12 (*)    GFR calc non Af Amer 4 (*)    GFR calc Af Amer 5 (*)    Anion gap 16 (*)    All other components within normal limits  CK  I-STAT TROPOININ, ED    Imaging Review Dg Chest 2 View  04/01/2015   CLINICAL DATA:  Patient with left-sided weakness.  EXAM: CHEST  2 VIEW  COMPARISON:  Chest radiograph 12/2014  FINDINGS: Stable cardiomegaly. Bilateral predominately perihilar interstitial pulmonary opacities. No definite pleural effusion or pneumothorax. Mid thoracic spine degenerative changes.  IMPRESSION:  Cardiomegaly with mild interstitial edema.   Electronically Signed   By: Lovey Newcomer M.D.   On: 04/01/2015 00:58   Ct Head  Wo Contrast  04/01/2015   CLINICAL DATA:  Patient with left arm and leg weakness.  EXAM: CT HEAD WITHOUT CONTRAST  TECHNIQUE: Contiguous axial images were obtained from the base of the skull through the vertex without intravenous contrast.  COMPARISON:  None.  FINDINGS: Ventricles and sulci are appropriate for patient's age. Periventricular and subcortical white matter hypodensities compatible with chronic small vessel ischemic changes. No evidence for acute cortically based infarct, intracranial hemorrhage, mass lesion or mass-effect. Calvarium intact. Paranasal sinuses unremarkable. Mastoid air cells unremarkable.  IMPRESSION: No acute intracranial process.  Chronic small vessel ischemic changes.   Electronically Signed   By: Lovey Newcomer M.D.   On: 04/01/2015 01:11     EKG Interpretation   Date/Time:  Thursday March 31 2015 23:47:14 EDT Ventricular Rate:  105 PR Interval:  142 QRS Duration: 107 QT Interval:  360 QTC Calculation: 476 R Axis:   87 Text Interpretation:  Sinus tachycardia Probable left atrial enlargement  Borderline prolonged QT interval Since last tracing rate faster Confirmed  by Kent Braunschweig  MD, Mitch Arquette ([id number]) on 04/01/2015 12:01:19 AM      MDM   Final diagnoses:  ESRD (end stage renal disease)  Left-sided weakness   60 yo male presents to the ER from home with complaint of left sided weakness that prevented him from going from dialysis today.  Pt has had difficulties walking due to weakness, convinced by neighbor to be seen.  High concern for stroke/hypertensive emergency.  Pt is not TPA candidate or code stroke candidate due to onset of symptoms roughly 20 hours ago.  (Pt woke with symptoms in night).  No chest pain, sob.  Exam shows weakness of L side, UE>LE.  No CN deficits.  No prior stroke history.  Plan for labs, ekg, chest xray, head CT and  admission.  I personally performed the services described in this documentation, which was scribed in my presence. The recorded information has been reviewed and is accurate.   Linton Flemings, MD 04/01/15 [phone]

## 2015-03-31 NOTE — ED Notes (Signed)
Patient transported to X-ray 

## 2015-03-31 NOTE — ED Notes (Addendum)
Pt. did not go to his hemodialysis treatment today , reports generalized weakness/fatigue with body aches , respirations unlabored , no fever or chills. Hypertensive at triage with occasional dry cough.

## 2015-04-01 ENCOUNTER — Observation Stay (HOSPITAL_COMMUNITY): Payer: Medicare Other

## 2015-04-01 ENCOUNTER — Emergency Department (HOSPITAL_COMMUNITY): Payer: Medicare Other

## 2015-04-01 DIAGNOSIS — I25810 Atherosclerosis of coronary artery bypass graft(s) without angina pectoris: Secondary | ICD-10-CM | POA: Diagnosis not present

## 2015-04-01 DIAGNOSIS — M6289 Other specified disorders of muscle: Secondary | ICD-10-CM | POA: Diagnosis not present

## 2015-04-01 DIAGNOSIS — N186 End stage renal disease: Secondary | ICD-10-CM

## 2015-04-01 DIAGNOSIS — R531 Weakness: Secondary | ICD-10-CM | POA: Diagnosis present

## 2015-04-01 DIAGNOSIS — G459 Transient cerebral ischemic attack, unspecified: Secondary | ICD-10-CM | POA: Diagnosis not present

## 2015-04-01 DIAGNOSIS — I519 Heart disease, unspecified: Secondary | ICD-10-CM | POA: Diagnosis not present

## 2015-04-01 DIAGNOSIS — E8779 Other fluid overload: Secondary | ICD-10-CM

## 2015-04-01 DIAGNOSIS — I10 Essential (primary) hypertension: Secondary | ICD-10-CM

## 2015-04-01 HISTORY — DX: Transient cerebral ischemic attack, unspecified: G45.9

## 2015-04-01 LAB — CBC
HEMATOCRIT: 31.2 % — AB (ref 39.0–52.0)
HEMOGLOBIN: 10 g/dL — AB (ref 13.0–17.0)
MCH: 26 pg (ref 26.0–34.0)
MCHC: 32.1 g/dL (ref 30.0–36.0)
MCV: 81.3 fL (ref 78.0–100.0)
Platelets: 154 10*3/uL (ref 150–400)
RBC: 3.84 MIL/uL — AB (ref 4.22–5.81)
RDW: 18.3 % — ABNORMAL HIGH (ref 11.5–15.5)
WBC: 6.1 10*3/uL (ref 4.0–10.5)

## 2015-04-01 LAB — LIPID PANEL
Cholesterol: 86 mg/dL (ref 0–200)
HDL: 22 mg/dL — ABNORMAL LOW (ref >40)
LDL CALC: 48 mg/dL (ref 0–99)
TRIGLYCERIDES: 79 mg/dL (ref <150)
Total CHOL/HDL Ratio: 3.9 RATIO
VLDL: 16 mg/dL (ref 0–40)

## 2015-04-01 LAB — PROTIME-INR
INR: 1.28 (ref 0.00–1.49)
Prothrombin Time: 16.2 seconds — ABNORMAL HIGH (ref 11.6–15.2)

## 2015-04-01 LAB — COMPREHENSIVE METABOLIC PANEL
ALT: 11 U/L — AB (ref 17–63)
AST: 8 U/L — ABNORMAL LOW (ref 15–41)
Albumin: 3.3 g/dL — ABNORMAL LOW (ref 3.5–5.0)
Alkaline Phosphatase: 60 U/L (ref 38–126)
Anion gap: 17 — ABNORMAL HIGH (ref 5–15)
BILIRUBIN TOTAL: 0.8 mg/dL (ref 0.3–1.2)
BUN: 53 mg/dL — ABNORMAL HIGH (ref 6–20)
CHLORIDE: 97 mmol/L — AB (ref 101–111)
CO2: 23 mmol/L (ref 22–32)
CREATININE: 11.85 mg/dL — AB (ref 0.61–1.24)
Calcium: 9.4 mg/dL (ref 8.9–10.3)
GFR calc Af Amer: 5 mL/min — ABNORMAL LOW (ref >60)
GFR, EST NON AFRICAN AMERICAN: 4 mL/min — AB (ref >60)
GLUCOSE: 102 mg/dL — AB (ref 65–99)
Potassium: 3.9 mmol/L (ref 3.5–5.1)
Sodium: 137 mmol/L (ref 135–145)
Total Protein: 6.9 g/dL (ref 6.5–8.1)

## 2015-04-01 LAB — TROPONIN I: Troponin I: 0.05 ng/mL — ABNORMAL HIGH (ref <0.031)

## 2015-04-01 LAB — GLUCOSE, CAPILLARY
Glucose-Capillary: 101 mg/dL — ABNORMAL HIGH (ref 65–99)
Glucose-Capillary: 109 mg/dL — ABNORMAL HIGH (ref 65–99)
Glucose-Capillary: 146 mg/dL — ABNORMAL HIGH (ref 65–99)

## 2015-04-01 LAB — I-STAT TROPONIN, ED: TROPONIN I, POC: 0.03 ng/mL (ref 0.00–0.08)

## 2015-04-01 LAB — CK: Total CK: 72 U/L (ref 49–397)

## 2015-04-01 MED ORDER — RENA-VITE PO TABS
1.0000 | ORAL_TABLET | Freq: Every day | ORAL | Status: DC
  Administered 2015-04-01: 1 via ORAL
  Administered 2015-04-02: 23:00:00 via ORAL
  Filled 2015-04-01 (×2): qty 1

## 2015-04-01 MED ORDER — PREGABALIN 50 MG PO CAPS
50.0000 mg | ORAL_CAPSULE | Freq: Every day | ORAL | Status: DC
  Administered 2015-04-01 – 2015-04-03 (×3): 50 mg via ORAL
  Filled 2015-04-01 (×3): qty 2

## 2015-04-01 MED ORDER — ASPIRIN 300 MG RE SUPP
300.0000 mg | Freq: Every day | RECTAL | Status: DC
  Filled 2015-04-01: qty 1

## 2015-04-01 MED ORDER — ASPIRIN 325 MG PO TABS
325.0000 mg | ORAL_TABLET | Freq: Every day | ORAL | Status: DC
  Administered 2015-04-01 – 2015-04-03 (×3): 325 mg via ORAL
  Filled 2015-04-01 (×3): qty 1

## 2015-04-01 MED ORDER — HEPARIN SODIUM (PORCINE) 1000 UNIT/ML DIALYSIS: 1000.0000 [IU] | INTRAMUSCULAR | Status: DC | PRN

## 2015-04-01 MED ORDER — SEVELAMER CARBONATE 800 MG PO TABS
2400.0000 mg | ORAL_TABLET | Freq: Two times a day (BID) | ORAL | Status: DC
Start: 2015-04-01 — End: 2015-04-01
  Administered 2015-04-01: 2400 mg via ORAL
  Filled 2015-04-01: qty 3

## 2015-04-01 MED ORDER — CINACALCET HCL 30 MG PO TABS
90.0000 mg | ORAL_TABLET | Freq: Every day | ORAL | Status: DC
  Administered 2015-04-01 – 2015-04-03 (×3): 90 mg via ORAL
  Filled 2015-04-01 (×4): qty 3

## 2015-04-01 MED ORDER — STROKE: EARLY STAGES OF RECOVERY BOOK
Freq: Once | Status: AC
  Administered 2015-04-01: 05:00:00
  Filled 2015-04-01: qty 1

## 2015-04-01 MED ORDER — VENLAFAXINE HCL 37.5 MG PO TABS
37.5000 mg | ORAL_TABLET | Freq: Two times a day (BID) | ORAL | Status: DC
  Administered 2015-04-01 – 2015-04-03 (×3): 37.5 mg via ORAL
  Filled 2015-04-01 (×3): qty 1

## 2015-04-01 MED ORDER — SODIUM CHLORIDE 0.9 % IV SOLN: 100.0000 mL | INTRAVENOUS | Status: DC | PRN

## 2015-04-01 MED ORDER — FINASTERIDE 5 MG PO TABS
5.0000 mg | ORAL_TABLET | Freq: Every day | ORAL | Status: DC
  Administered 2015-04-01 – 2015-04-03 (×3): 5 mg via ORAL
  Filled 2015-04-01 (×3): qty 1

## 2015-04-01 MED ORDER — NEPRO/CARBSTEADY PO LIQD: 237.0000 mL | ORAL | Status: DC | PRN

## 2015-04-01 MED ORDER — HYDRALAZINE HCL 20 MG/ML IJ SOLN
10.0000 mg | INTRAMUSCULAR | Status: DC | PRN
  Administered 2015-04-01: 10 mg via INTRAVENOUS
  Filled 2015-04-01 (×2): qty 1

## 2015-04-01 MED ORDER — CALCIUM ACETATE (PHOS BINDER) 667 MG PO CAPS
1334.0000 mg | ORAL_CAPSULE | Freq: Three times a day (TID) | ORAL | Status: DC
  Administered 2015-04-01 – 2015-04-03 (×5): 1334 mg via ORAL
  Filled 2015-04-01 (×10): qty 2

## 2015-04-01 MED ORDER — ATORVASTATIN CALCIUM 10 MG PO TABS
20.0000 mg | ORAL_TABLET | Freq: Every day | ORAL | Status: DC
  Administered 2015-04-01 – 2015-04-03 (×3): 20 mg via ORAL
  Filled 2015-04-01: qty 1
  Filled 2015-04-01 (×3): qty 2

## 2015-04-01 MED ORDER — SUCRALFATE 1 GM/10ML PO SUSP
1.0000 g | Freq: Three times a day (TID) | ORAL | Status: DC
  Administered 2015-04-01 – 2015-04-03 (×7): 1 g via ORAL
  Filled 2015-04-01 (×8): qty 10

## 2015-04-01 MED ORDER — CARVEDILOL 12.5 MG PO TABS
50.0000 mg | ORAL_TABLET | Freq: Two times a day (BID) | ORAL | Status: DC
  Administered 2015-04-01 – 2015-04-03 (×3): 50 mg via ORAL
  Filled 2015-04-01 (×3): qty 4

## 2015-04-01 MED ORDER — PANTOPRAZOLE SODIUM 40 MG PO TBEC
40.0000 mg | DELAYED_RELEASE_TABLET | Freq: Every day | ORAL | Status: DC
  Administered 2015-04-01 – 2015-04-03 (×3): 40 mg via ORAL
  Filled 2015-04-01 (×3): qty 1

## 2015-04-01 MED ORDER — DOXERCALCIFEROL 4 MCG/2ML IV SOLN
4.0000 ug | Freq: Once | INTRAVENOUS | Status: DC
Start: 2015-04-01 — End: 2015-04-03
  Filled 2015-04-01: qty 2

## 2015-04-01 MED ORDER — MECLIZINE HCL 12.5 MG PO TABS: 25.0000 mg | ORAL_TABLET | Freq: Three times a day (TID) | ORAL | Status: DC | PRN

## 2015-04-01 MED ORDER — HEPARIN SODIUM (PORCINE) 1000 UNIT/ML DIALYSIS: 20.0000 [IU]/kg | INTRAMUSCULAR | Status: DC | PRN

## 2015-04-01 MED ORDER — INSULIN ASPART 100 UNIT/ML ~~LOC~~ SOLN
0.0000 [IU] | Freq: Three times a day (TID) | SUBCUTANEOUS | Status: DC
  Administered 2015-04-02: 2 [IU] via SUBCUTANEOUS

## 2015-04-01 MED ORDER — PENTAFLUOROPROP-TETRAFLUOROETH EX AERO: 1.0000 "application " | INHALATION_SPRAY | CUTANEOUS | Status: DC | PRN

## 2015-04-01 MED ORDER — ACETAMINOPHEN 325 MG PO TABS
650.0000 mg | ORAL_TABLET | Freq: Four times a day (QID) | ORAL | Status: DC | PRN
  Administered 2015-04-01 – 2015-04-03 (×2): 650 mg via ORAL
  Filled 2015-04-01 (×2): qty 2

## 2015-04-01 MED ORDER — CLOPIDOGREL BISULFATE 75 MG PO TABS
75.0000 mg | ORAL_TABLET | Freq: Every day | ORAL | Status: DC
  Administered 2015-04-01 – 2015-04-03 (×3): 75 mg via ORAL
  Filled 2015-04-01 (×3): qty 1

## 2015-04-01 MED ORDER — LIDOCAINE-PRILOCAINE 2.5-2.5 % EX CREA: 1.0000 "application " | TOPICAL_CREAM | CUTANEOUS | Status: DC | PRN

## 2015-04-01 MED ORDER — AMLODIPINE BESYLATE 10 MG PO TABS
10.0000 mg | ORAL_TABLET | Freq: Every day | ORAL | Status: DC
  Administered 2015-04-01 – 2015-04-03 (×3): 10 mg via ORAL
  Filled 2015-04-01 (×3): qty 1

## 2015-04-01 MED ORDER — AMITRIPTYLINE HCL 25 MG PO TABS
100.0000 mg | ORAL_TABLET | Freq: Every day | ORAL | Status: DC
  Administered 2015-04-01 – 2015-04-02 (×2): 100 mg via ORAL
  Filled 2015-04-01 (×2): qty 4

## 2015-04-01 MED ORDER — SEVELAMER CARBONATE 800 MG PO TABS
2400.0000 mg | ORAL_TABLET | Freq: Three times a day (TID) | ORAL | Status: DC
  Administered 2015-04-02 – 2015-04-03 (×4): 2400 mg via ORAL
  Filled 2015-04-01 (×6): qty 3

## 2015-04-01 MED ORDER — DOXERCALCIFEROL 4 MCG/2ML IV SOLN
INTRAVENOUS | Status: AC
  Administered 2015-04-01: 4 ug
  Filled 2015-04-01: qty 2

## 2015-04-01 MED ORDER — IPRATROPIUM-ALBUTEROL 0.5-2.5 (3) MG/3ML IN SOLN: 3.0000 mL | Freq: Four times a day (QID) | RESPIRATORY_TRACT | Status: DC | PRN

## 2015-04-01 MED ORDER — NEPRO/CARBSTEADY PO LIQD
237.0000 mL | Freq: Two times a day (BID) | ORAL | Status: DC
  Administered 2015-04-03: 237 mL via ORAL

## 2015-04-01 MED ORDER — INSULIN ASPART 100 UNIT/ML ~~LOC~~ SOLN: 0.0000 [IU] | Freq: Four times a day (QID) | SUBCUTANEOUS | Status: DC

## 2015-04-01 MED ORDER — ALTEPLASE 2 MG IJ SOLR: 2.0000 mg | Freq: Once | INTRAMUSCULAR | Status: DC | PRN

## 2015-04-01 MED ORDER — LIDOCAINE HCL (PF) 1 % IJ SOLN: 5.0000 mL | INTRAMUSCULAR | Status: DC | PRN

## 2015-04-01 MED ORDER — RANOLAZINE ER 500 MG PO TB12
500.0000 mg | ORAL_TABLET | Freq: Two times a day (BID) | ORAL | Status: DC
  Administered 2015-04-01 – 2015-04-03 (×4): 500 mg via ORAL
  Filled 2015-04-01 (×5): qty 1

## 2015-04-01 MED ORDER — SEVELAMER CARBONATE 800 MG PO TABS
1600.0000 mg | ORAL_TABLET | Freq: Every day | ORAL | Status: DC
  Administered 2015-04-01: 1600 mg via ORAL

## 2015-04-01 MED ORDER — HEPARIN SODIUM (PORCINE) 5000 UNIT/ML IJ SOLN
5000.0000 [IU] | Freq: Three times a day (TID) | INTRAMUSCULAR | Status: DC
  Administered 2015-04-01 – 2015-04-03 (×6): 5000 [IU] via SUBCUTANEOUS
  Filled 2015-04-01 (×6): qty 1

## 2015-04-01 MED ORDER — ONDANSETRON HCL 4 MG/2ML IJ SOLN
4.0000 mg | Freq: Four times a day (QID) | INTRAMUSCULAR | Status: DC | PRN
  Administered 2015-04-01 – 2015-04-02 (×3): 4 mg via INTRAVENOUS
  Filled 2015-04-01 (×3): qty 2

## 2015-04-01 NOTE — Progress Notes (Signed)
Pt with N&V after lunch. Denies any new weakness, no vs baseline, no obvious progression. Reported episode to D. [facility], with new order for zofran. No Further c/o nv.

## 2015-04-01 NOTE — H&P (Signed)
[facility] History and Physical  Patient: [name] [name]  MRN: [id number]  DOB: 1956/08/10  DOS: the patient was seen and examined on 04/01/2015 PCP: [name]  Referring physician: Dr. Sharol Given Chief Complaint: Left-sided weakness  HPI: [name] [name] is a 59 y.o. male with Past medical history of ESRD on hemodialysis Tuesday Thursday Saturday, diabetes mellitus with chronic kidney disease, essential hypertension, coronary artery disease, chronic diastolic CHF. The patient is presenting with complaints of generalized weakness with left-sided weakness. Patient mentions that he was at his baseline yesterday and has been working with his son to paint his house but when he woke up this morning he was significantly weak and fatigue and was unable to do anything and almost fell as he couldn't hardly get up on his own. He mentions he had significant weakness on left arm and left leg. He denies any fever chills shortness of breath chest pain vomiting diarrhea constipation. He mentions he is compliant with all his medications. He mentions he was so weak that he has not been able to work up his hemodialysis session today as well. He denies any similar symptoms in the past.  The patient is coming from home. And at his baseline independent for most of his ADL.  Review of Systems: as mentioned in the history of present illness.  A comprehensive review of the other systems is negative.  Past Medical History  Diagnosis Date  . Hypertension   . ESRD on dialysis   . Diabetes mellitus with nephropathy   . Hematochezia     a. 2014: colonscopy, which showed moderately-sized internal hemorrhoids, two 74mm polyps in transverse colon and ascending colon that were resected, five 2-41mm polyps in sigmoid colon, descending colon, transverse colon, and ascending colon that were resected. An upper endoscopy was performed and showed normal esophagus, stomach, and duodenum.  . Hematuria     a. H/o  hematuria 2014 with cystoscopy that was unrevealing for his source of hematuria. He underwent a kidney ultrasound on 10/14 that showed mildly echogenic and scarred kidneys compatible with medical renal disease, without hydronephrosis or renal calculi.  [name] Anemia   . Depression   . CAD (coronary artery disease)     a. per [facility] s/p BMS to mid LAD 12/2009 and DES to mid LAD 10/2010.  . Colon polyps   . Chronic diastolic CHF (congestive heart failure)    Past Surgical History  Procedure Laterality Date  . Left heart catheterization with coronary angiogram N/A 11/23/2014    Procedure: LEFT HEART CATHETERIZATION WITH CORONARY ANGIOGRAM;  Surgeon: Troy Sine, MD;  Location: [facility] CATH LAB;  Service: Cardiovascular;  Laterality: N/A;   Social History:  reports that he quit smoking about 4 years ago. He has never used smokeless tobacco. He reports that he does not drink alcohol or use illicit drugs.  Allergies  Allergen Reactions  . Enalapril Hives    Family History  Problem Relation Age of Onset  . Hypertension    . Bone cancer Mother   . Anuerysm Father   . Diabetes type II Daughter     Prior to Admission medications   Medication Sig Start Date End Date Taking? Authorizing Provider  acetaminophen (TYLENOL) 500 MG tablet Take 500 mg by mouth every 6 (six) hours as needed for headache.    Yes Historical Provider, MD  amitriptyline (ELAVIL) 100 MG tablet Take 1 tablet (100 mg total) by mouth at bedtime. 12/15/14  Yes Geradine Girt, DO  amLODipine (  NORVASC) 10 MG tablet Take 10 mg by mouth daily.   Yes Historical Provider, MD  aspirin EC 81 MG tablet Take 81 mg by mouth daily.   Yes Historical Provider, MD  atorvastatin (LIPITOR) 20 MG tablet Take 20 mg by mouth daily.   Yes Historical Provider, MD  calcium acetate (PHOSLO) 667 MG capsule Take 2 capsules (1,334 mg total) by mouth 3 (three) times daily with meals. 11/10/14  Yes Juliet Rude, MD  carvedilol (COREG) 25 MG tablet Take 50 mg  by mouth 2 (two) times daily.    Yes Historical Provider, MD  cinacalcet (SENSIPAR) 90 MG tablet Take 90 mg by mouth daily.   Yes Historical Provider, MD  clopidogrel (PLAVIX) 75 MG tablet Take 1 tablet (75 mg total) by mouth daily. 11/24/14  Yes Juluis Mire, MD  Darbepoetin Alfa (ARANESP) 25 MCG/0.42ML SOSY injection Inject 0.42 mLs (25 mcg total) into the vein every Tuesday with hemodialysis. 12/15/14  Yes Geradine Girt, DO  doxercalciferol (HECTOROL) 4 MCG/2ML injection Inject 1 mL (2 mcg total) into the vein Every Tuesday,Thursday,and Saturday with dialysis. 12/15/14  Yes Geradine Girt, DO  ferric gluconate 62.5 mg in sodium chloride 0.9 % 100 mL Inject 62.5 mg into the vein every Thursday with hemodialysis. 12/16/14  Yes Geradine Girt, DO  finasteride (PROSCAR) 5 MG tablet Take 5 mg by mouth daily.   Yes Historical Provider, MD  insulin aspart (NOVOLOG) 100 UNIT/ML injection Inject 15-20 Units into the skin 3 (three) times daily as needed for high blood sugar (CBG >150).    Yes Historical Provider, MD  insulin glargine (LANTUS) 100 UNIT/ML injection Inject 10 Units into the skin at bedtime.   Yes Historical Provider, MD  Ipratropium-Albuterol (COMBIVENT RESPIMAT) 20-100 MCG/ACT AERS respimat Inhale 2 puffs into the lungs every 6 (six) hours as needed for wheezing.   Yes Historical Provider, MD  meclizine (ANTIVERT) 25 MG tablet Take 25 mg by mouth 3 (three) times daily as needed for dizziness.   Yes Historical Provider, MD  multivitamin (RENA-VIT) TABS tablet Take 1 tablet by mouth at bedtime. 12/15/14  Yes Jessica U Vann, DO  ondansetron (ZOFRAN) 4 MG tablet Take 1 tablet (4 mg total) by mouth every 6 (six) hours. Patient taking differently: Take 4 mg by mouth every 6 (six) hours as needed for nausea or vomiting.  12/02/14  Yes Britt Bottom, NP  pantoprazole (PROTONIX) 40 MG tablet Take 1 tablet (40 mg total) by mouth daily. 11/24/14  Yes Juluis Mire, MD  pregabalin (LYRICA) 50 MG capsule  Take 1 capsule (50 mg total) by mouth daily. 12/15/14  Yes Geradine Girt, DO  ranolazine (RANEXA) 500 MG 12 hr tablet Take 1 tablet (500 mg total) by mouth 2 (two) times daily. 11/24/14  Yes Juluis Mire, MD  sevelamer carbonate (RENVELA) 800 MG tablet Take 1,600-2,400 mg by mouth 3 (three) times daily with meals. Pt takes 3 capsules with breakfast, 2 capsules with lunch, and 3 capsules with dinner.   Yes Historical Provider, MD  sucralfate (CARAFATE) 1 GM/10ML suspension Take 10 mLs (1 g total) by mouth 4 (four) times daily -  with meals and at bedtime. 12/15/14  Yes Geradine Girt, DO  venlafaxine (EFFEXOR) 37.5 MG tablet Take 37.5 mg by mouth 2 (two) times daily.   Yes Historical Provider, MD    Physical Exam: Filed Vitals:   04/01/15 0214 04/01/15 0215 04/01/15 0218 04/01/15 0411  BP:  172/92 172/92 165/96  Pulse:  98 99 97  Temp: 97.9 F (36.6 C)  97.9 F (36.6 C) 97.9 F (36.6 C)  TempSrc:   Oral Oral  Resp:  22 26   Height:    6' (1.829 m)  Weight:    93.305 kg (205 lb 11.2 oz)  SpO2:  92% 95% 93%    General: Alert, Awake and Oriented to Time, Place and Person. Appear in mild distress Eyes: PERRL ENT: Oral Mucosa clear moist. Neck: no JVD Cardiovascular: S1 and S2 Present, aortic systolic Murmur, Peripheral Pulses Present Respiratory: Bilateral Air entry equal and Decreased,  Clear to Auscultation, no Crackles, no wheezes Abdomen: Bowel Sound presentoft and nono tender Skin: no Rash Extremities: Trace Pedal edema, no calf tenderness Neurologic: Left-sided weakness 3/5  Labs on Admission:  CBC:  Recent Labs Lab 03/31/15 2238  WBC 9.1  NEUTROABS 6.9  HGB 10.9*  HCT 34.7*  MCV 82.0  PLT 189    CMP     Component Value Date/Time   NA 140 03/31/2015 2238   K 4.5 03/31/2015 2238   CL 97* 03/31/2015 2238   CO2 27 03/31/2015 2238   GLUCOSE 106* 03/31/2015 2238   BUN 52* 03/31/2015 2238   CREATININE 11.42* 03/31/2015 2238   CALCIUM 10.0 03/31/2015 2238   PROT  7.4 03/31/2015 2238   ALBUMIN 3.7 03/31/2015 2238   AST 12* 03/31/2015 2238   ALT 12* 03/31/2015 2238   ALKPHOS 78 03/31/2015 2238   BILITOT 0.7 03/31/2015 2238   GFRNONAA 4* 03/31/2015 2238   GFRAA 5* 03/31/2015 2238    No results for input(s): LIPASE, AMYLASE in the last 168 hours.   Recent Labs Lab 03/31/15 2316  CKTOTAL 72   BNP (last 3 results)  Recent Labs  11/20/14 2245 12/06/14 0845 12/12/14 2100  BNP 75.6 86.9 816.3*    ProBNP (last 3 results) No results for input(s): PROBNP in the last 8760 hours.   Radiological Exams on Admission: Dg Chest 2 View  04/01/2015   CLINICAL DATA:  Patient with left-sided weakness.  EXAM: CHEST  2 VIEW  COMPARISON:  Chest radiograph 12/2014  FINDINGS: Stable cardiomegaly. Bilateral predominately perihilar interstitial pulmonary opacities. No definite pleural effusion or pneumothorax. Mid thoracic spine degenerative changes.  IMPRESSION: Cardiomegaly with mild interstitial edema.   Electronically Signed   By: Lovey Newcomer M.D.   On: 04/01/2015 00:58   Ct Head Wo Contrast  04/01/2015   CLINICAL DATA:  Patient with left arm and leg weakness.  EXAM: CT HEAD WITHOUT CONTRAST  TECHNIQUE: Contiguous axial images were obtained from the base of the skull through the vertex without intravenous contrast.  COMPARISON:  None.  FINDINGS: Ventricles and sulci are appropriate for patient's age. Periventricular and subcortical white matter hypodensities compatible with chronic small vessel ischemic changes. No evidence for acute cortically based infarct, intracranial hemorrhage, mass lesion or mass-effect. Calvarium intact. Paranasal sinuses unremarkable. Mastoid air cells unremarkable.  IMPRESSION: No acute intracranial process.  Chronic small vessel ischemic changes.   Electronically Signed   By: Lovey Newcomer M.D.   On: 04/01/2015 01:11   Mr Brain Wo Contrast  04/01/2015   CLINICAL DATA:  Acute onset LEFT-sided weakness 20 hours ago, unable to go to  dialysis today. Weakness. History of hypertension, end-stage renal disease, diabetes.  EXAM: MRI HEAD WITHOUT CONTRAST  MRA HEAD WITHOUT CONTRAST  TECHNIQUE: Multiplanar, multiecho pulse sequences of the brain and surrounding structures were obtained without intravenous contrast. Angiographic images of the head were obtained using MRA  technique without contrast.  COMPARISON:  CT head April 01, 2015 0016 hours  FINDINGS: MRI HEAD FINDINGS  No reduced diffusion to suggest acute ischemia. No susceptibility artifact to suggest hemorrhage. Confluent T2 hyperintense signal within the pons. Patchy to confluent supratentorial white matter T2 hyperintense signal. 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 No midline shift or mass effect.  No abnormal extra-axial fluid collections. Ocular globes and orbital contents are unremarkable. Paranasal sinuses and mastoid air cells are well aerated. No abnormal sellar expansion. No cerebellar tonsillar ectopia. No suspicious calvarial bone marrow signal. Patient is edentulous.  MRA HEAD FINDINGS  Anterior circulation: Normal flow related enhancement of the included cervical, petrous, cavernous and supra clinoid internal carotid arteries. Patent anterior communicating artery. Normal flow related enhancement of the anterior and middle cerebral arteries, including more distal segments.  No large vessel occlusion, high-grade stenosis, abnormal luminal irregularity, aneurysm.  Posterior circulation: Codominant vertebral arteries. Basilar artery is patent, with normal flow related enhancement of the main branch vessels. Small RIGHT posterior communicating arteries present. Normal flow related enhancement of the posterior cerebral arteries. Mild stenosis LEFT P3 segment.  No large vessel occlusion, high-grade stenosis, abnormal luminal irregularity, aneurysm.  IMPRESSION: MRI HEAD: No acute intracranial process, specifically no acute ischemia.  Moderate to severe white matter changes compatible with  chronic small vessel ischemic disease. 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  MRA HEAD: No acute large vessel occlusion or high-grade stenosis. Mild stenosis proximal LEFT P3 segment.   Electronically Signed   By: Elon Alas M.D.   On: 04/01/2015 04:15   Mr Jodene Nam Head/brain Wo Cm  04/01/2015   CLINICAL DATA:  Acute onset LEFT-sided weakness 20 hours ago, unable to go to dialysis today. Weakness. History of hypertension, end-stage renal disease, diabetes.  EXAM: MRI HEAD WITHOUT CONTRAST  MRA HEAD WITHOUT CONTRAST  TECHNIQUE: Multiplanar, multiecho pulse sequences of the brain and surrounding structures were obtained without intravenous contrast. Angiographic images of the head were obtained using MRA technique without contrast.  COMPARISON:  CT head April 01, 2015 0016 hours  FINDINGS: MRI HEAD FINDINGS  No reduced diffusion to suggest acute ischemia. No susceptibility artifact to suggest hemorrhage. Confluent T2 hyperintense signal within the pons. Patchy to confluent supratentorial white matter T2 hyperintense signal. 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 No midline shift or mass effect.  No abnormal extra-axial fluid collections. Ocular globes and orbital contents are unremarkable. Paranasal sinuses and mastoid air cells are well aerated. No abnormal sellar expansion. No cerebellar tonsillar ectopia. No suspicious calvarial bone marrow signal. Patient is edentulous.  MRA HEAD FINDINGS  Anterior circulation: Normal flow related enhancement of the included cervical, petrous, cavernous and supra clinoid internal carotid arteries. Patent anterior communicating artery. Normal flow related enhancement of the anterior and middle cerebral arteries, including more distal segments.  No large vessel occlusion, high-grade stenosis, abnormal luminal irregularity, aneurysm.  Posterior circulation: Codominant vertebral arteries. Basilar artery is patent, with normal flow related enhancement of the main branch vessels. Small  RIGHT posterior communicating arteries present. Normal flow related enhancement of the posterior cerebral arteries. Mild stenosis LEFT P3 segment.  No large vessel occlusion, high-grade stenosis, abnormal luminal irregularity, aneurysm.  IMPRESSION: MRI HEAD: No acute intracranial process, specifically no acute ischemia.  Moderate to severe white matter changes compatible with chronic small vessel ischemic disease. 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  MRA HEAD: No acute large vessel occlusion or high-grade stenosis. Mild stenosis proximal LEFT P3 segment.   Electronically Signed   By: Elon Alas  M.D.   On: 04/01/2015 04:15   EKG: Independently reviewed. normal sinus rhythm, nonspecific ST and T waves changes.  Assessment/Plan Principal Problem:   TIA (transient ischemic attack) Active Problems:   ESRD (end stage renal disease)   Hypertension   CAD (coronary artery disease) of artery bypass graft   Volume overload   Diastolic dysfunction   Left-sided weakness   1. TIA (transient ischemic attack) The patient is presenting with numbness of left-sided weakness. He presented with significantly elevated blood pressure. Blood pressure at present stable after receiving labetalol. CT scan is negative. Neurology has been consulted. We will check MRI/MRA brain. Further workup depending on the MRI findings. Continue with aspirin and Plavix per home regimen.  2.Accelerated hypertension with hypertensive urgency. Continuing home medications. When necessary hydralazine. Currently the setting of stroke will allow pressure to stay high and control only if more than [id number] systolic or [id number] diastolic.  3. ESRD on hemodialysis. Patient has missed yesterday's dialysis. Nephrology dialysis line is consulted. Patient at present does not appear to be in acute need for hemodialysis but will require hemodialysis in the morning.  4. GERD. Continuing Carafate as well as Protonix.  5. Coronary artery  disease. Continue aspirin and Plavix as well as continue Ranexa.  Advance goals of care discussion: Full code   Consults: ED  discussed with  Dr. Nicole Kindred from neurology.  DVT Prophylaxis: subcutaneous Heparin Nutrition: Nothing by mouth pending stroke evaluation  Disposition: Admitted as observation, Telemetry unit.  Author: Berle Mull, MD [facility] Pager: [phone] 04/01/2015  If 7PM-7AM, please contact night-coverage [email or web address] Password [email or web address]

## 2015-04-01 NOTE — Progress Notes (Signed)
STROKE TEAM PROGRESS NOTE   HISTORY [name] [name] is a 59 y.o. male with a history of end-stage renal disease on dialysis, hypertension and diabetes mellitus, presenting with left-sided weakness which was present when he woke up this morning. He has no history of stroke nor TIA. He is also experiencing pain involving his left shoulder. There was no change in speech. No facial droop was reported. CT scan of his head showed no acute intracranial abnormality. 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 No acute findings were seen. Strength of his left extremities and improved since onset. He still had subjective decrease in strength of his left hand compared to the right.  LSN:  tPA Given: No: Beyond time under for treatment consideration; sits improved mRankin:   SUBJECTIVE (INTERVAL HISTORY) No family members present. The patient stated that he felt his symptoms were due to not eating. He feels his deficits are improving. He stated he has a history of neuropathy from the knees down. The patient admits that he was not taking his aspirin every day. Dr. Leonie Man explained the importance of being compliant with his medications.   OBJECTIVE Temp:  [96.8 F (36 C)-98.2 F (36.8 C)] 96.8 F (36 C) (06/24 1440) Pulse Rate:  [82-108] 82 (06/24 1600) Cardiac Rhythm:  [-] Normal sinus rhythm (06/24 1500) Resp:  [16-27] 20 (06/24 1440) BP: (144-194)/(76-111) 153/88 mmHg (06/24 1600) SpO2:  [90 %-99 %] 99 % (06/24 0800) Weight:  [92.08 kg (203 lb)-93.305 kg (205 lb 11.2 oz)] 92.4 kg (203 lb 11.3 oz) (06/24 1440)   Recent Labs Lab 04/01/15 0623 04/01/15 1155  GLUCAP 101* 109*    Recent Labs Lab 03/31/15 2238 04/01/15 0630  NA 140 137  K 4.5 3.9  CL 97* 97*  CO2 27 23  GLUCOSE 106* 102*  BUN 52* 53*  CREATININE 11.42* 11.85*  CALCIUM 10.0 9.4    Recent Labs Lab 03/31/15 2238 04/01/15 0630  AST 12* 8*  ALT 12* 11*  ALKPHOS 78 60   BILITOT 0.7 0.8  PROT 7.4 6.9  ALBUMIN 3.7 3.3*    Recent Labs Lab 03/31/15 2238 04/01/15 0630  WBC 9.1 6.1  NEUTROABS 6.9  --   HGB 10.9* 10.0*  HCT 34.7* 31.2*  MCV 82.0 81.3  PLT 189 154    Recent Labs Lab 03/31/15 2316 04/01/15 0630  CKTOTAL 72  --   TROPONINI  --  0.05*    Recent Labs  04/01/15 0630  LABPROT 16.2*  INR 1.28   No results for input(s): COLORURINE, LABSPEC, PHURINE, GLUCOSEU, HGBUR, BILIRUBINUR, KETONESUR, PROTEINUR, UROBILINOGEN, NITRITE, LEUKOCYTESUR in the last 72 hours.  Invalid input(s): APPERANCEUR     Component Value Date/Time   CHOL 86 04/01/2015 0630   TRIG 79 04/01/2015 0630   HDL 22* 04/01/2015 0630   CHOLHDL 3.9 04/01/2015 0630   VLDL 16 04/01/2015 0630   LDLCALC 48 04/01/2015 0630   Lab Results  Component Value Date   HGBA1C 5.5 12/13/2014      Component Value Date/Time   LABOPIA NONE DETECTED 12/13/2014 0302   COCAINSCRNUR NONE DETECTED 12/13/2014 0302   LABBENZ NONE DETECTED 12/13/2014 0302   AMPHETMU NONE DETECTED 12/13/2014 0302   THCU NONE DETECTED 12/13/2014 0302   LABBARB NONE DETECTED 12/13/2014 0302    No results for input(s): ETH in the last 168 hours.   Imaging  Dg Chest 2 View 04/01/2015    Cardiomegaly with mild interstitial edema.  Ct Head Wo Contrast 04/01/2015    No acute intracranial process.  Chronic small vessel ischemic changes.      Mr Brain Wo Contrast 04/01/2015    MRI HEAD:  No acute intracranial process, specifically no acute ischemia.  Moderate to severe white matter changes compatible with chronic small vessel ischemic disease. 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   MRA HEAD:  No acute large vessel occlusion or high-grade stenosis. Mild stenosis proximal LEFT P3 segment.       PHYSICAL EXAM Pleasant middle-aged African-American male not in distress.  . He has dialysis fistula in the left forearm. . Afebrile. Head is nontraumatic. Neck is supple without bruit.    Cardiac exam no  murmur or gallop. Lungs are clear to auscultation. Distal pulses are well felt. Neurological Exam : Awake alert oriented 3 with normal speech and language function. Fundi were not visualized. Vision acuity seems adequate. Extraocular movements are full range without nystagmus. Face is symmetric without weakness. Tongue is midline. Motor system exam revealed no upper or lower extremity drift.  no focal weakness. Diminished touch pinprick sensation in both lower extremity is from knee down. Impaired vibration and position sense bilaterally from ankle down. Both ankle jerks are depressed. Plantars are downgoing. Gait was not tested.    ASSESSMENT/PLAN [name] is a 59 y.o. male with history of end-stage renal disease on dialysis, hypertension, coronary artery disease, chronic diastolic congestive heart failure, and diabetes mellitus,  presenting with left-sided weakness. He did not receive IV t-PA due to late presentation.   TIA:  Non-dominant secondary to small vessel disease.  Resultant    MRI  No acute intracranial process, specifically no acute ischemia. 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   MRA  No acute large vessel occlusion or high-grade stenosis.  Carotid Doppler 1-39% ICA stenosis. Vertebral artery flow is antegrade.   2D Echo - 12/06/2014 - EF 55%. No cardiac source of emboli identified.  LDL 48  HgbA1c pending  Subcutaneous heparin for VTE prophylaxis Diet renal/carb modified with fluid restriction Diet-HS Snack?: Nothing; Room service appropriate?: Yes; Fluid consistency:: Thin  aspirin 81 mg orally every day and clopidogrel 75 mg orally every day prior to admission, now on aspirin 325 mg orally every day and Plavix 75 mg daily  Patient counseled to be compliant with his antithrombotic medications  Ongoing aggressive stroke risk factor management  Therapy recommendations:  Pending  Disposition:  Discharge when workup is complete  Hypertension  Home meds:  Norvasc and Coreg  Blood pressure mildly high at times  Patient counseled to be compliant with his blood pressure medications  Hyperlipidemia  Home meds: Lipitor 20 mg daily  resumed in hospital  LDL 48, goal < 70  Continue statin at discharge  Diabetes  HgbA1c , pending goal < 7.0  Controlled  Other Stroke Risk Factors  Cigarette smoker, quit smoking 4 years ago   Hx stroke/TIA  Coronary artery disease   Other Active Problems    Other Pertinent History  Peripheral neuropathy  Chronic anemia  Hospital day #   Mikey Bussing PA-C [facility] Pager [phone] 04/01/2015, 4:20 PM  I have personally examined this patient, reviewed notes, independently viewed imaging studies, participated in medical decision making and plan of care. I have made any additions or clarifications directly to the above note. Agree with note above. He presented with transient left-sided weakness likely due to a right hemispheric TIA etiology small vessel disease. He remains at risk for neurological  worsening, recurrent stroke, TIA and needs ongoing stroke evaluation and aggressive risk factor modification. Continue Plavix for stroke prevention   Antony Contras, MD Medical Director [facility] Pager: [phone] 04/01/2015 4:39 PM    To contact Stroke Continuity provider, please refer to [email or web address] After hours, contact General Neurology

## 2015-04-01 NOTE — Procedures (Signed)
Patient was seen on dialysis and the procedure was supervised.  BFR 400  Via AVF BP is  162/93.   Patient appears to be tolerating treatment well  [name] [name] A 04/01/2015

## 2015-04-01 NOTE — Progress Notes (Signed)
*  PRELIMINARY RESULTS* Vascular Ultrasound Carotid Duplex (Doppler) has been completed.  Preliminary findings: Bilateral:  1-39% ICA stenosis.  Vertebral artery flow is antegrade.      Landry Mellow, RDMS, RVT  04/01/2015, 11:16 AM

## 2015-04-01 NOTE — Progress Notes (Signed)
Pt. Arrived to unit alert and oriented X4. Oriented to unit and room. Call bell at side. Bed alarm activated. Will continue to monitor. Bobbye Charleston, RN

## 2015-04-01 NOTE — Progress Notes (Signed)
Initial Nutrition Assessment  DOCUMENTATION CODES:  Not applicable  INTERVENTION:  Nepro shake po BID, each supplement provides 425 kcal and 19 grams protein   NUTRITION DIAGNOSIS:  Increased nutrient needs related to chronic illness as evidenced by estimated needs, percent weight loss.   GOAL:  Patient will meet greater than or equal to 90% of their needs   MONITOR:  PO intake, Supplement acceptance, Labs, Weight trends, I & O's  REASON FOR ASSESSMENT:  Malnutrition Screening Tool    ASSESSMENT: 59 y.o. male with Past medical history of ESRD on hemodialysis Tuesday Thursday Saturday, diabetes mellitus with chronic kidney disease, essential hypertension, coronary artery disease, chronic diastolic CHF. The patient is presenting with complaints of generalized weakness with left-sided weakness.  Pt reports losing from 240 lbs down to 190 lbs in the past year due to nausea and vomiting; He doesn't know why but, for months he vomited every night. Recently he has been doing better and only vomits on occasion. His appetite is good but, varies; he usually eats 2-3 meals per day. States he follows a renal diet and has the resources he needs.  RD emphasized the importance of getting adequate nutrition, calories, and protein daily. Encouraged intake of protein at each meal and intake of Nepro Shakes if any meal is skipped or if weight loss continues.   Labs: low hemoglobin, low chloride  Height:  Ht Readings from Last 1 Encounters:  04/01/15 6' (1.829 m)    Weight:  Wt Readings from Last 1 Encounters:  04/01/15 205 lb 11.2 oz (93.305 kg)    Ideal Body Weight:  80.9 kg  Wt Readings from Last 10 Encounters:  04/01/15 205 lb 11.2 oz (93.305 kg)  02/22/15 191 lb (86.637 kg)  12/14/14 201 lb 4.8 oz (91.309 kg)  12/06/14 205 lb (92.987 kg)  12/04/14 206 lb (93.441 kg)  11/24/14 205 lb 11 oz (93.3 kg)  11/09/14 214 lb 15.2 oz (97.5 kg)  04/26/14 242 lb 3.2 oz (109.861 kg)     BMI:  Body mass index is 27.89 kg/(m^2).  Estimated Nutritional Needs:  Kcal:  2200-2400  Protein:  115-125 grams  Fluid:  per MD  Skin:  Reviewed, no issues  Diet Order:  Diet renal/carb modified with fluid restriction Diet-HS Snack?: Nothing; Room service appropriate?: Yes; Fluid consistency:: Thin  EDUCATION NEEDS:  No education needs identified at this time   Intake/Output Summary (Last 24 hours) at 04/01/15 1226 Last data filed at 04/01/15 0524  Gross per 24 hour  Intake    723 ml  Output      0 ml  Net    723 ml    Last BM:  PTA  Pryor Ochoa RD, LDN Inpatient Clinical Dietitian Pager: [phone] After Hours Pager: [phone]

## 2015-04-01 NOTE — Progress Notes (Signed)
PROGRESS NOTE  [name] [name] [id number] DOB: 10/18/1955 DOA: 03/31/2015 PCP: [name]  Brief history 59 year old male with a history of diabetes mellitus, ESRD, CAD, diastolic CHF, hypertension, COPD presented with left-sided weakness affecting his left arm and left leg began in the morning of 03/31/2015 when he woke up. Apparently, the patient was unsteady in normal sterile because of his weakness. The patient also missed his dialysis treatment and his outpatient Center on 03/31/2015. He denied any fevers, chills, chest pain, shortness breath, nausea, vomiting. Neurology was consulted and recommended full stroke Workup.   Assessment/Plan: TIA -appreciate neurology -MRI brain No acute intracranial process, specifically no acute ischemia.  -MRA brain No acute large vessel occlusion or high-grade stenosis. -Carotid duplex negative for hemodynamically significant stenosis -12/06/2014 echocardiogram shows EF [id number], grade 1 diastolic dysfunction, no WMA -12/13/2014 hemoglobin A1c 5.5 -LDL 48  -continue aspirin and plavix Hypertensive urgency  -Patient had blood pressure 192/104 in the emergency department  -Continue amlodipine, carvedilol   ESRD  -Patient received dialysis 04/01/2015  -Patient nephrology  GERD  -Continue Carafate and PPI  Coronary artery disease  -History of PCI 2 Jennifer 2012  -February 2016 cardiac catheterization negative for obstructive lesions  -Continue aspirin, Plavix, Ranexa Hyperlipidemia  -Continue Lipitor 20 mg daily  -LDL 48  Diabetes mellitus type 2  -12/13/2014 hemoglobin A1c 5.5  -hold lantus 10 units as pt is vomiting Vomiting -?previously improved with carafate -?related to missing HD -?related to taking too many pills at one time -LFTs WNL  Family Communication:   Pt at beside Disposition Plan:   Home when medically stable       Procedures/Studies: Dg Chest 2 View  04/01/2015   CLINICAL DATA:  Patient with  left-sided weakness.  EXAM: CHEST  2 VIEW  COMPARISON:  Chest radiograph 12/2014  FINDINGS: Stable cardiomegaly. Bilateral predominately perihilar interstitial pulmonary opacities. No definite pleural effusion or pneumothorax. Mid thoracic spine degenerative changes.  IMPRESSION: Cardiomegaly with mild interstitial edema.   Electronically Signed   By: Lovey Newcomer M.D.   On: 04/01/2015 00:58   Ct Head Wo Contrast  04/01/2015   CLINICAL DATA:  Patient with left arm and leg weakness.  EXAM: CT HEAD WITHOUT CONTRAST  TECHNIQUE: Contiguous axial images were obtained from the base of the skull through the vertex without intravenous contrast.  COMPARISON:  None.  FINDINGS: Ventricles and sulci are appropriate for patient's age. Periventricular and subcortical white matter hypodensities compatible with chronic small vessel ischemic changes. No evidence for acute cortically based infarct, intracranial hemorrhage, mass lesion or mass-effect. Calvarium intact. Paranasal sinuses unremarkable. Mastoid air cells unremarkable.  IMPRESSION: No acute intracranial process.  Chronic small vessel ischemic changes.   Electronically Signed   By: Lovey Newcomer M.D.   On: 04/01/2015 01:11   Mr Brain Wo Contrast  04/01/2015   CLINICAL DATA:  Acute onset LEFT-sided weakness 20 hours ago, unable to go to dialysis today. Weakness. History of hypertension, end-stage renal disease, diabetes.  EXAM: MRI HEAD WITHOUT CONTRAST  MRA HEAD WITHOUT CONTRAST  TECHNIQUE: Multiplanar, multiecho pulse sequences of the brain and surrounding structures were obtained without intravenous contrast. Angiographic images of the head were obtained using MRA technique without contrast.  COMPARISON:  CT head April 01, 2015 0016 hours  FINDINGS: MRI HEAD FINDINGS  No reduced diffusion to suggest acute ischemia. No susceptibility artifact to suggest hemorrhage. Confluent T2 hyperintense signal within the pons. Patchy to confluent  supratentorial white matter T2  hyperintense signal. 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 No midline shift or mass effect.  No abnormal extra-axial fluid collections. Ocular globes and orbital contents are unremarkable. Paranasal sinuses and mastoid air cells are well aerated. No abnormal sellar expansion. No cerebellar tonsillar ectopia. No suspicious calvarial bone marrow signal. Patient is edentulous.  MRA HEAD FINDINGS  Anterior circulation: Normal flow related enhancement of the included cervical, petrous, cavernous and supra clinoid internal carotid arteries. Patent anterior communicating artery. Normal flow related enhancement of the anterior and middle cerebral arteries, including more distal segments.  No large vessel occlusion, high-grade stenosis, abnormal luminal irregularity, aneurysm.  Posterior circulation: Codominant vertebral arteries. Basilar artery is patent, with normal flow related enhancement of the main branch vessels. Small RIGHT posterior communicating arteries present. Normal flow related enhancement of the posterior cerebral arteries. Mild stenosis LEFT P3 segment.  No large vessel occlusion, high-grade stenosis, abnormal luminal irregularity, aneurysm.  IMPRESSION: MRI HEAD: No acute intracranial process, specifically no acute ischemia.  Moderate to severe white matter changes compatible with chronic small vessel ischemic disease. 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  MRA HEAD: No acute large vessel occlusion or high-grade stenosis. Mild stenosis proximal LEFT P3 segment.   Electronically Signed   By: Elon Alas M.D.   On: 04/01/2015 04:15   Mr Jodene Nam Head/brain Wo Cm  04/01/2015   CLINICAL DATA:  Acute onset LEFT-sided weakness 20 hours ago, unable to go to dialysis today. Weakness. History of hypertension, end-stage renal disease, diabetes.  EXAM: MRI HEAD WITHOUT CONTRAST  MRA HEAD WITHOUT CONTRAST  TECHNIQUE: Multiplanar, multiecho pulse sequences of the brain and surrounding structures were obtained without  intravenous contrast. Angiographic images of the head were obtained using MRA technique without contrast.  COMPARISON:  CT head April 01, 2015 0016 hours  FINDINGS: MRI HEAD FINDINGS  No reduced diffusion to suggest acute ischemia. No susceptibility artifact to suggest hemorrhage. Confluent T2 hyperintense signal within the pons. Patchy to confluent supratentorial white matter T2 hyperintense signal. 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 No midline shift or mass effect.  No abnormal extra-axial fluid collections. Ocular globes and orbital contents are unremarkable. Paranasal sinuses and mastoid air cells are well aerated. No abnormal sellar expansion. No cerebellar tonsillar ectopia. No suspicious calvarial bone marrow signal. Patient is edentulous.  MRA HEAD FINDINGS  Anterior circulation: Normal flow related enhancement of the included cervical, petrous, cavernous and supra clinoid internal carotid arteries. Patent anterior communicating artery. Normal flow related enhancement of the anterior and middle cerebral arteries, including more distal segments.  No large vessel occlusion, high-grade stenosis, abnormal luminal irregularity, aneurysm.  Posterior circulation: Codominant vertebral arteries. Basilar artery is patent, with normal flow related enhancement of the main branch vessels. Small RIGHT posterior communicating arteries present. Normal flow related enhancement of the posterior cerebral arteries. Mild stenosis LEFT P3 segment.  No large vessel occlusion, high-grade stenosis, abnormal luminal irregularity, aneurysm.  IMPRESSION: MRI HEAD: No acute intracranial process, specifically no acute ischemia.  Moderate to severe white matter changes compatible with chronic small vessel ischemic disease. 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  MRA HEAD: No acute large vessel occlusion or high-grade stenosis. Mild stenosis proximal LEFT P3 segment.   Electronically Signed   By: Elon Alas M.D.   On: 04/01/2015 04:15           Subjective:   Objective: Filed Vitals:   04/01/15 1450 04/01/15 1500 04/01/15 1530 04/01/15 1600  BP: 162/93 162/93 144/89 153/88  Pulse: 82 82  82 82  Temp:      TempSrc:      Resp:      Height:      Weight:      SpO2:        Intake/Output Summary (Last 24 hours) at 04/01/15 1618 Last data filed at 04/01/15 0524  Gross per 24 hour  Intake    723 ml  Output      0 ml  Net    723 ml   Weight change:  Exam:   General:  Pt is alert, follows commands appropriately, not in acute distress  HEENT: No icterus, No thrush, No neck mass, [place]/AT  Cardiovascular: RRR, S1/S2, no rubs, no gallops  Respiratory: CTA bilaterally, no wheezing, no crackles, no rhonchi  Abdomen: Soft/+BS, non tender, non distended, no guarding  Extremities: No edema, No lymphangitis, No petechiae, No rashes, no synovitis  Data Reviewed: Basic Metabolic Panel:  Recent Labs Lab 03/31/15 2238 04/01/15 0630  NA 140 137  K 4.5 3.9  CL 97* 97*  CO2 27 23  GLUCOSE 106* 102*  BUN 52* 53*  CREATININE 11.42* 11.85*  CALCIUM 10.0 9.4   Liver Function Tests:  Recent Labs Lab 03/31/15 2238 04/01/15 0630  AST 12* 8*  ALT 12* 11*  ALKPHOS 78 60  BILITOT 0.7 0.8  PROT 7.4 6.9  ALBUMIN 3.7 3.3*   No results for input(s): LIPASE, AMYLASE in the last 168 hours. No results for input(s): AMMONIA in the last 168 hours. CBC:  Recent Labs Lab 03/31/15 2238 04/01/15 0630  WBC 9.1 6.1  NEUTROABS 6.9  --   HGB 10.9* 10.0*  HCT 34.7* 31.2*  MCV 82.0 81.3  PLT 189 154   Cardiac Enzymes:  Recent Labs Lab 03/31/15 2316 04/01/15 0630  CKTOTAL 72  --   TROPONINI  --  0.05*   BNP: Invalid input(s): POCBNP CBG:  Recent Labs Lab 04/01/15 0623 04/01/15 1155  GLUCAP 101* 109*    No results found for this or any previous visit (from the past 240 hour(s)).   Scheduled Meds: . amitriptyline  100 mg Oral QHS  . amLODipine  10 mg Oral Daily  . aspirin  300 mg Rectal Daily    Or  . aspirin  325 mg Oral Daily  . atorvastatin  20 mg Oral Daily  . calcium acetate  1,334 mg Oral TID WC  . carvedilol  50 mg Oral BID WC  . cinacalcet  90 mg Oral Q breakfast  . clopidogrel  75 mg Oral Daily  . doxercalciferol  4 mcg Intravenous Once  . feeding supplement (NEPRO CARB STEADY)  237 mL Oral BID BM  . finasteride  5 mg Oral Daily  . heparin  5,000 Units Subcutaneous 3 times per day  . insulin aspart  0-9 Units Subcutaneous TID WC  . multivitamin  1 tablet Oral QHS  . pantoprazole  40 mg Oral Daily  . pregabalin  50 mg Oral Daily  . ranolazine  500 mg Oral BID  . sevelamer carbonate  2,400 mg Oral TID WC  . sucralfate  1 g Oral TID WC & HS  . venlafaxine  37.5 mg Oral BID WC   Continuous Infusions:    Adorian Gwynne, DO  [facility] Pager [phone]  If 7PM-7AM, please contact night-coverage [email or web address] Password [facility] 04/01/2015, 4:18 PM

## 2015-04-01 NOTE — Progress Notes (Signed)
Report attempted. ER RN will call back. Bobbye Charleston, RN

## 2015-04-01 NOTE — Progress Notes (Signed)
SLP Cancellation Note  Patient Details Name: Loranzo Tiano MRN: [id number] DOB: 09-12-1956   Cancelled treatment:       Reason Eval/Treat Not Completed: Patient at procedure or test/unavailable   Germain Osgood, M.A. CCC-SLP [phone]  Germain Osgood 04/01/2015, 2:44 PM

## 2015-04-01 NOTE — ED Notes (Signed)
Patient transported to MRI 

## 2015-04-01 NOTE — Progress Notes (Signed)
PT Cancellation Note  Patient Details Name: [name] [name] MRN: [id number] DOB: [birth year]   Cancelled Treatment:    Reason Eval/Treat Not Completed: Patient at procedure or test/unavailable (Leaving for dialysis.  Will return in am. )   Denice Paradise 04/01/2015, 2:36 PM  [name] [phone] (pager)

## 2015-04-01 NOTE — ED Notes (Signed)
Admitting at bedside 

## 2015-04-01 NOTE — Consult Note (Signed)
[facility] Renal Consultation Note    Indication for Consultation:  Management of ESRD/hemodialysis; anemia, hypertension/volume and secondary hyperparathyroidism Referring physician: Dr. Berle Mull  HPI: [name] [name] is a 59 y.o. male with ESRD secondary to DM.who transferred to [name] March 2015 from [facility] - on HD 4 years prior to that.  Had a hospitalization at [facility] for ? CVA 03/2014.  He is on TTS dialysis at [facility] and last dialyzed Tuesday. He missed Thursday HD because he woke up yesterday with weakness in his left arm and leg.  The day prior,to admission, he had been outside while his apartment was being painted.  He was not doing the painting.. Notes from his HD unit state he had a hospitalization at [facility] for ? CVA 03/2014.  He was seen by the rounding provider 03/29/2015 who noted pt had slightly high BP. The patient was not SOB nor had edema, but the pt was agreeable to trying a lower EDW which was lowered 0.5 for Thursday, but he missed due to CVA symptoms. He feels better today and is walking with a walker, though he wasn't using any assistive device prior to admission. He generally feels well without CP, SOB, edema, but occasionally suffers from diarrhea. He has severe neuropathy and states lyrica is the only thing that helps. Seen in HD- had n/v but received med and is better- reports weakness is better as well   Past Medical History  Diagnosis Date  . Hypertension   . ESRD on dialysis   . Diabetes mellitus with nephropathy   . Hematochezia     a. 2014: colonscopy, which showed moderately-sized internal hemorrhoids, two 28mm polyps in transverse colon and ascending colon that were resected, five 2-11mm polyps in sigmoid colon, descending colon, transverse colon, and ascending colon that were resected. An upper endoscopy was performed and showed normal esophagus, stomach, and duodenum.  . Hematuria     a. H/o hematuria 2014 with cystoscopy  that was unrevealing for his source of hematuria. He underwent a kidney ultrasound on 10/14 that showed mildly echogenic and scarred kidneys compatible with medical renal disease, without hydronephrosis or renal calculi.  [name] Anemia   . Depression   . CAD (coronary artery disease)     a. per [facility] s/p BMS to mid LAD 12/2009 and DES to mid LAD 10/2010.  . Colon polyps   . Chronic diastolic CHF (congestive heart failure)    Past Surgical History  Procedure Laterality Date  . Left heart catheterization with coronary angiogram N/A 11/23/2014    Procedure: LEFT HEART CATHETERIZATION WITH CORONARY ANGIOGRAM;  Surgeon: Troy Sine, MD;  Location: [facility] CATH LAB;  Service: Cardiovascular;  Laterality: N/A;   Family History  Problem Relation Age of Onset  . Hypertension    . Bone cancer Mother   . Anuerysm Father   . Diabetes type II Daughter    Social History:  reports that he quit smoking about 4 years ago. He has never used smokeless tobacco. He reports that he does not drink alcohol or use illicit drugs. Allergies  Allergen Reactions  . Enalapril Hives   Prior to Admission medications   Medication Sig Start Date End Date Taking? Authorizing Provider  acetaminophen (TYLENOL) 500 MG tablet Take 500 mg by mouth every 6 (six) hours as needed for headache.    Yes Historical Provider, MD  amitriptyline (ELAVIL) 100 MG tablet Take 1 tablet (100 mg total) by mouth at bedtime. 12/15/14  Yes Geradine Girt, DO  amLODipine (NORVASC) 10 MG tablet Take 10 mg by mouth daily.   Yes Historical Provider, MD  aspirin EC 81 MG tablet Take 81 mg by mouth daily.   Yes Historical Provider, MD  atorvastatin (LIPITOR) 20 MG tablet Take 20 mg by mouth daily.   Yes Historical Provider, MD  calcium acetate (PHOSLO) 667 MG capsule Take 2 capsules (1,334 mg total) by mouth 3 (three) times daily with meals. 11/10/14  Yes Juliet Rude, MD  carvedilol (COREG) 25 MG tablet Take 50 mg by mouth 2 (two) times daily.     Yes Historical Provider, MD  cinacalcet (SENSIPAR) 90 MG tablet Take 90 mg by mouth daily.   Yes Historical Provider, MD  clopidogrel (PLAVIX) 75 MG tablet Take 1 tablet (75 mg total) by mouth daily. 11/24/14  Yes Juluis Mire, MD  Darbepoetin Alfa (ARANESP) 25 MCG/0.42ML SOSY injection Inject 0.42 mLs (25 mcg total) into the vein every Tuesday with hemodialysis. 12/15/14  Yes Geradine Girt, DO  doxercalciferol (HECTOROL) 4 MCG/2ML injection Inject 1 mL (2 mcg total) into the vein Every Tuesday,Thursday,and Saturday with dialysis. 12/15/14  Yes Geradine Girt, DO  ferric gluconate 62.5 mg in sodium chloride 0.9 % 100 mL Inject 62.5 mg into the vein every Thursday with hemodialysis. 12/16/14  Yes Geradine Girt, DO  finasteride (PROSCAR) 5 MG tablet Take 5 mg by mouth daily.   Yes Historical Provider, MD  insulin aspart (NOVOLOG) 100 UNIT/ML injection Inject 15-20 Units into the skin 3 (three) times daily as needed for high blood sugar (CBG >150).    Yes Historical Provider, MD  insulin glargine (LANTUS) 100 UNIT/ML injection Inject 10 Units into the skin at bedtime.   Yes Historical Provider, MD  Ipratropium-Albuterol (COMBIVENT RESPIMAT) 20-100 MCG/ACT AERS respimat Inhale 2 puffs into the lungs every 6 (six) hours as needed for wheezing.   Yes Historical Provider, MD  meclizine (ANTIVERT) 25 MG tablet Take 25 mg by mouth 3 (three) times daily as needed for dizziness.   Yes Historical Provider, MD  multivitamin (RENA-VIT) TABS tablet Take 1 tablet by mouth at bedtime. 12/15/14  Yes Jessica U Vann, DO  ondansetron (ZOFRAN) 4 MG tablet Take 1 tablet (4 mg total) by mouth every 6 (six) hours. Patient taking differently: Take 4 mg by mouth every 6 (six) hours as needed for nausea or vomiting.  12/02/14  Yes Britt Bottom, NP  pantoprazole (PROTONIX) 40 MG tablet Take 1 tablet (40 mg total) by mouth daily. 11/24/14  Yes Juluis Mire, MD  pregabalin (LYRICA) 50 MG capsule Take 1 capsule (50 mg total) by  mouth daily. 12/15/14  Yes Geradine Girt, DO  ranolazine (RANEXA) 500 MG 12 hr tablet Take 1 tablet (500 mg total) by mouth 2 (two) times daily. 11/24/14  Yes Juluis Mire, MD  sevelamer carbonate (RENVELA) 800 MG tablet Take 1,600-2,400 mg by mouth 3 (three) times daily with meals. Pt takes 3 capsules with breakfast, 2 capsules with lunch, and 3 capsules with dinner.   Yes Historical Provider, MD  sucralfate (CARAFATE) 1 GM/10ML suspension Take 10 mLs (1 g total) by mouth 4 (four) times daily -  with meals and at bedtime. 12/15/14  Yes Geradine Girt, DO  venlafaxine (EFFEXOR) 37.5 MG tablet Take 37.5 mg by mouth 2 (two) times daily.   Yes Historical Provider, MD   Current Facility-Administered Medications  Medication Dose Route Frequency Provider Last Rate Last Dose  . acetaminophen (TYLENOL)  tablet 650 mg  650 mg Oral Q6H PRN Gardiner Barefoot, NP   650 mg at 04/01/15 0522  . amitriptyline (ELAVIL) tablet 100 mg  100 mg Oral QHS Lavina Hamman, MD      . amLODipine (NORVASC) tablet 10 mg  10 mg Oral Daily Lavina Hamman, MD   10 mg at 04/01/15 1015  . aspirin suppository 300 mg  300 mg Rectal Daily Lavina Hamman, MD       Or  . aspirin tablet 325 mg  325 mg Oral Daily Lavina Hamman, MD   325 mg at 04/01/15 1015  . atorvastatin (LIPITOR) tablet 20 mg  20 mg Oral Daily Lavina Hamman, MD   20 mg at 04/01/15 1014  . calcium acetate (PHOSLO) capsule 1,334 mg  1,334 mg Oral TID WC Lavina Hamman, MD   1,334 mg at 04/01/15 1016  . carvedilol (COREG) tablet 50 mg  50 mg Oral BID WC Lavina Hamman, MD   50 mg at 04/01/15 1013  . cinacalcet (SENSIPAR) tablet 90 mg  90 mg Oral Q breakfast Lavina Hamman, MD   90 mg at 04/01/15 1017  . clopidogrel (PLAVIX) tablet 75 mg  75 mg Oral Daily Lavina Hamman, MD   75 mg at 04/01/15 1015  . doxercalciferol (HECTOROL) injection 4 mcg  4 mcg Intravenous Once Alric Seton, PA-C      . feeding supplement (NEPRO CARB STEADY) liquid 237 mL  237 mL Oral BID BM  Lavina Hamman, MD   237 mL at 04/01/15 1017  . finasteride (PROSCAR) tablet 5 mg  5 mg Oral Daily Lavina Hamman, MD   5 mg at 04/01/15 1013  . heparin injection 5,000 Units  5,000 Units Subcutaneous 3 times per day Lavina Hamman, MD   5,000 Units at 04/01/15 [phone]  . hydrALAZINE (APRESOLINE) injection 10 mg  10 mg Intravenous Q4H PRN Lavina Hamman, MD   10 mg at 04/01/15 [id number]  . insulin aspart (novoLOG) injection 0-9 Units  0-9 Units Subcutaneous TID WC Orson Eva, MD   0 Units at 04/01/15 1000  . ipratropium-albuterol (DUONEB) 0.5-2.5 (3) MG/3ML nebulizer solution 3 mL  3 mL Inhalation Q6H PRN Lavina Hamman, MD      . meclizine (ANTIVERT) tablet 25 mg  25 mg Oral TID PRN Lavina Hamman, MD      . multivitamin (RENA-VIT) tablet 1 tablet  1 tablet Oral QHS Lavina Hamman, MD      . pantoprazole (PROTONIX) EC tablet 40 mg  40 mg Oral Daily Lavina Hamman, MD   40 mg at 04/01/15 1013  . pregabalin (LYRICA) capsule 50 mg  50 mg Oral Daily Lavina Hamman, MD   50 mg at 04/01/15 1015  . ranolazine (RANEXA) 12 hr tablet 500 mg  500 mg Oral BID Lavina Hamman, MD   500 mg at 04/01/15 1013  . sevelamer carbonate (RENVELA) tablet 1,600 mg  1,600 mg Oral QAC lunch Lavina Hamman, MD      . sevelamer carbonate (RENVELA) tablet 2,400 mg  2,400 mg Oral BID WC Lavina Hamman, MD   2,400 mg at 04/01/15 1015  . sucralfate (CARAFATE) 1 GM/10ML suspension 1 g  1 g Oral TID WC & HS Lavina Hamman, MD   1 g at 04/01/15 1015  . venlafaxine (EFFEXOR) tablet 37.5 mg  37.5 mg Oral BID WC Lavina Hamman, MD  37.5 mg at 04/01/15 1013   Labs: Basic Metabolic Panel:  Recent Labs Lab 03/31/15 2238 04/01/15 0630  NA 140 137  K 4.5 3.9  CL 97* 97*  CO2 27 23  GLUCOSE 106* 102*  BUN 52* 53*  CREATININE 11.42* 11.85*  CALCIUM 10.0 9.4   Liver Function Tests:  Recent Labs Lab 03/31/15 2238 04/01/15 0630  AST 12* 8*  ALT 12* 11*  ALKPHOS 78 60  BILITOT 0.7 0.8  PROT 7.4 6.9  ALBUMIN 3.7 3.3*    CBC:  Recent Labs Lab 03/31/15 2238 04/01/15 0630  WBC 9.1 6.1  NEUTROABS 6.9  --   HGB 10.9* 10.0*  HCT 34.7* 31.2*  MCV 82.0 81.3  PLT 189 154   Cardiac Enzymes:  Recent Labs Lab 03/31/15 2316 04/01/15 0630  CKTOTAL 72  --   TROPONINI  --  0.05*   CBG:  Recent Labs Lab 04/01/15 0623  GLUCAP 101*  Studies/Results: Dg Chest 2 View  04/01/2015   CLINICAL DATA:  Patient with left-sided weakness.  EXAM: CHEST  2 VIEW  COMPARISON:  Chest radiograph 12/2014  FINDINGS: Stable cardiomegaly. Bilateral predominately perihilar interstitial pulmonary opacities. No definite pleural effusion or pneumothorax. Mid thoracic spine degenerative changes.  IMPRESSION: Cardiomegaly with mild interstitial edema.   Electronically Signed   By: Lovey Newcomer M.D.   On: 04/01/2015 00:58   Ct Head Wo Contrast  04/01/2015   CLINICAL DATA:  Patient with left arm and leg weakness.  EXAM: CT HEAD WITHOUT CONTRAST  TECHNIQUE: Contiguous axial images were obtained from the base of the skull through the vertex without intravenous contrast.  COMPARISON:  None.  FINDINGS: Ventricles and sulci are appropriate for patient's age. Periventricular and subcortical white matter hypodensities compatible with chronic small vessel ischemic changes. No evidence for acute cortically based infarct, intracranial hemorrhage, mass lesion or mass-effect. Calvarium intact. Paranasal sinuses unremarkable. Mastoid air cells unremarkable.  IMPRESSION: No acute intracranial process.  Chronic small vessel ischemic changes.   Electronically Signed   By: Lovey Newcomer M.D.   On: 04/01/2015 01:11   Mr Brain Wo Contrast  04/01/2015   CLINICAL DATA:  Acute onset LEFT-sided weakness 20 hours ago, unable to go to dialysis today. Weakness. History of hypertension, end-stage renal disease, diabetes.  EXAM: MRI HEAD WITHOUT CONTRAST  MRA HEAD WITHOUT CONTRAST  TECHNIQUE: Multiplanar, multiecho pulse sequences of the brain and surrounding structures  were obtained without intravenous contrast. Angiographic images of the head were obtained using MRA technique without contrast.  COMPARISON:  CT head April 01, 2015 0016 hours  FINDINGS: MRI HEAD FINDINGS  No reduced diffusion to suggest acute ischemia. No susceptibility artifact to suggest hemorrhage. Confluent T2 hyperintense signal within the pons. Patchy to confluent supratentorial white matter T2 hyperintense signal. 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 No midline shift or mass effect.  No abnormal extra-axial fluid collections. Ocular globes and orbital contents are unremarkable. Paranasal sinuses and mastoid air cells are well aerated. No abnormal sellar expansion. No cerebellar tonsillar ectopia. No suspicious calvarial bone marrow signal. Patient is edentulous.  MRA HEAD FINDINGS  Anterior circulation: Normal flow related enhancement of the included cervical, petrous, cavernous and supra clinoid internal carotid arteries. Patent anterior communicating artery. Normal flow related enhancement of the anterior and middle cerebral arteries, including more distal segments.  No large vessel occlusion, high-grade stenosis, abnormal luminal irregularity, aneurysm.  Posterior circulation: Codominant vertebral arteries. Basilar artery is patent, with normal flow related enhancement of the main branch vessels.  Small RIGHT posterior communicating arteries present. Normal flow related enhancement of the posterior cerebral arteries. Mild stenosis LEFT P3 segment.  No large vessel occlusion, high-grade stenosis, abnormal luminal irregularity, aneurysm.  IMPRESSION: MRI HEAD: No acute intracranial process, specifically no acute ischemia.  Moderate to severe white matter changes compatible with chronic small vessel ischemic disease. 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  MRA HEAD: No acute large vessel occlusion or high-grade stenosis. Mild stenosis proximal LEFT P3 segment.   Electronically Signed   By: Elon Alas M.D.    On: 04/01/2015 04:15   Mr Jodene Nam Head/brain Wo Cm  04/01/2015   CLINICAL DATA:  Acute onset LEFT-sided weakness 20 hours ago, unable to go to dialysis today. Weakness. History of hypertension, end-stage renal disease, diabetes.  EXAM: MRI HEAD WITHOUT CONTRAST  MRA HEAD WITHOUT CONTRAST  TECHNIQUE: Multiplanar, multiecho pulse sequences of the brain and surrounding structures were obtained without intravenous contrast. Angiographic images of the head were obtained using MRA technique without contrast.  COMPARISON:  CT head April 01, 2015 0016 hours  FINDINGS: MRI HEAD FINDINGS  No reduced diffusion to suggest acute ischemia. No susceptibility artifact to suggest hemorrhage. Confluent T2 hyperintense signal within the pons. Patchy to confluent supratentorial white matter T2 hyperintense signal. 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 No midline shift or mass effect.  No abnormal extra-axial fluid collections. Ocular globes and orbital contents are unremarkable. Paranasal sinuses and mastoid air cells are well aerated. No abnormal sellar expansion. No cerebellar tonsillar ectopia. No suspicious calvarial bone marrow signal. Patient is edentulous.  MRA HEAD FINDINGS  Anterior circulation: Normal flow related enhancement of the included cervical, petrous, cavernous and supra clinoid internal carotid arteries. Patent anterior communicating artery. Normal flow related enhancement of the anterior and middle cerebral arteries, including more distal segments.  No large vessel occlusion, high-grade stenosis, abnormal luminal irregularity, aneurysm.  Posterior circulation: Codominant vertebral arteries. Basilar artery is patent, with normal flow related enhancement of the main branch vessels. Small RIGHT posterior communicating arteries present. Normal flow related enhancement of the posterior cerebral arteries. Mild stenosis LEFT P3 segment.  No large vessel occlusion, high-grade stenosis, abnormal luminal irregularity, aneurysm.   IMPRESSION: MRI HEAD: No acute intracranial process, specifically no acute ischemia.  Moderate to severe white matter changes compatible with chronic small vessel ischemic disease. 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  MRA HEAD: No acute large vessel occlusion or high-grade stenosis. Mild stenosis proximal LEFT P3 segment.   Electronically Signed   By: Elon Alas M.D.   On: 04/01/2015 04:15    ROS: As per HPI otherwise negative.  Physical Exam: Filed Vitals:   04/01/15 0215 04/01/15 0218 04/01/15 0411 04/01/15 0600  BP: 172/92 172/92 165/96 170/97  Pulse: 98 99 97 97  Temp:  97.9 F (36.6 C) 97.9 F (36.6 C) 98.1 F (36.7 C)  TempSrc:  Oral Oral Oral  Resp: 22 26  22   Height:   6' (1.829 m)   Weight:   93.305 kg (205 lb 11.2 oz)   SpO2: 92% 95% 93% 96%     General: Well developed, well nourished, in no acute distress. Head: Normocephalic, atraumatic, sclera non-icteric, mucus membranes are moist Neck: Supple. Neck veins full Lungs: crackles at bases.  Breathing is unlabored. Heart: RRR with S1 S2. Abdomen: Soft, non-tender, non-distended with active bowel sounds.  M-S:  Strength and tone appear normal for age. Lower extremities: without edema or ischemic changes, no open wounds  Neuro: Alert and oriented X 3. Moves all  extremities spontaneously. Psych:  Responds to questions appropriately with a normal affect. Dialysis Access: left AVF + bruit  Dialysis Orders: AF TTS 4 hr 180 500/800 EDW 88 2 K 2.25 Ca heparin 8000 AVF Hectorol 4 venofer 50 per week /last given 6/16 Mircera 100 q 2 weeks last given 6/14 Recent labs:  Hgb 10.5 6/16 19% sat 5/26 and ferritin 1588 5/19 Ca 10 P 9.6 iPTH 526 5/26  Assessment/Plan: 1. left sided weakness/TIA - work up in progress, weakness improving; old infarct on MRI plus chronic white matter ischemic changes 2. ESRD -  TTS - missed HD yesterday - plan HD today and again tomorrow to get back on schedule- will dialyze 3.5 hour Sat since getting  HD today. 3. Hypertension/volume  - mild interstitial edema on CXR -generally gets to edw with post HD BP 140 - 150s standing - may need slightly lower EDW - UF goals 2-4 kg on average - some of BP issue may be due to excess volume- needs updated BP med list at HD after discharge -  4. Anemia  -Hgb 10.0 - due for redosing of ESA 6/28 next week - will order then if still here- hold Fe due to ^ ferritin 5. Metabolic bone disease -  noncomliant with binders as outpt as evidenced by last P of 10, continue Hectorol- he reports he is "supposed to take 2 phoslo ac, 3 renvela ac and 90 sensipar per day" -needs updated med list at d/c 6. Nutrition - renal carb mod diet/vitamin 7. CAD - on asa, plavix, ranexa and BB 8. DM - per primary 9. GERD - on PPI and carafate - should not be on carafate > 1 month due to ^ Al content  Myriam Jacobson, PA-C [place] [phone] 04/01/2015, 11:18 AM   Patient seen and examined, agree with above note with above modifications. Pt with history of recent CVA now presenting with weakness on teh left side after strenuous activity- seen in HD feels well.  Work up in progress for TIA- Planning for HD today due to missed Thursday,. Then Saturday to get back on schedule  Corliss Parish, MD 04/01/2015

## 2015-04-01 NOTE — ED Notes (Signed)
Transporting patient to new room assignment. 

## 2015-04-01 NOTE — Consult Note (Signed)
Admission H&P    Chief Complaint: New onset left-sided weakness.  HPI: [name] [name] is an 59 y.o. male with a history of end-stage renal disease on dialysis, hypertension and diabetes mellitus, presenting with left-sided weakness which was present when he woke up this morning. He has no history of stroke nor TIA. He is also experiencing pain involving his left shoulder. There was no change in speech. No facial droop was reported. CT scan of his head showed no acute intracranial abnormality. 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 No acute findings were seen. Strength of his left extremities and improved since onset. He still had subjective decrease in strength of his left hand compared to the right.  LSN:  tPA Given: No: Beyond time under for treatment consideration; sits improved mRankin:  Past Medical History  Diagnosis Date  . Hypertension   . ESRD on dialysis   . Diabetes mellitus with nephropathy   . Hematochezia     a. 2014: colonscopy, which showed moderately-sized internal hemorrhoids, two 59mm polyps in transverse colon and ascending colon that were resected, five 2-27mm polyps in sigmoid colon, descending colon, transverse colon, and ascending colon that were resected. An upper endoscopy was performed and showed normal esophagus, stomach, and duodenum.  . Hematuria     a. H/o hematuria 2014 with cystoscopy that was unrevealing for his source of hematuria. He underwent a kidney ultrasound on 10/14 that showed mildly echogenic and scarred kidneys compatible with medical renal disease, without hydronephrosis or renal calculi.  [name] Anemia   . Depression   . CAD (coronary artery disease)     a. per [facility] s/p BMS to mid LAD 12/2009 and DES to mid LAD 10/2010.  . Colon polyps   . Chronic diastolic CHF (congestive heart failure)     Past Surgical History  Procedure Laterality Date  . Left heart catheterization with coronary  angiogram N/A 11/23/2014    Procedure: LEFT HEART CATHETERIZATION WITH CORONARY ANGIOGRAM;  Surgeon: Troy Sine, MD;  Location: [facility] CATH LAB;  Service: Cardiovascular;  Laterality: N/A;    Family History  Problem Relation Age of Onset  . Hypertension    . Bone cancer Mother   . Anuerysm Father   . Diabetes type II Daughter    Social History:  reports that he quit smoking about 4 years ago. He has never used smokeless tobacco. He reports that he does not drink alcohol or use illicit drugs.  Allergies:  Allergies  Allergen Reactions  . Enalapril Hives    Medications: Patient's medications medications were reviewed by me.  ROS: History obtained from the patient  General ROS: negative for - chills, fatigue, fever, night sweats, weight gain or weight loss Psychological ROS: negative for - behavioral disorder, hallucinations, memory difficulties, mood swings or suicidal ideation Ophthalmic ROS: negative for - blurry vision, double vision, eye pain or loss of vision ENT ROS: negative for - epistaxis, nasal discharge, oral lesions, sore throat, tinnitus or vertigo Allergy and Immunology ROS: negative for - hives or itchy/watery eyes Hematological and Lymphatic ROS: negative for - bleeding problems, bruising or swollen lymph nodes Endocrine ROS: negative for - galactorrhea, hair pattern changes, polydipsia/polyuria or temperature intolerance Respiratory ROS: negative for - cough, hemoptysis, shortness of breath or wheezing Cardiovascular ROS: negative for - chest pain, dyspnea on exertion, edema or irregular heartbeat Gastrointestinal ROS: negative for - abdominal pain, diarrhea, hematemesis, nausea/vomiting or stool incontinence Genito-Urinary ROS: negative for -  dysuria, hematuria, incontinence or urinary frequency/urgency Musculoskeletal ROS: Pain in left shoulder with movementMental Status: Neurological ROS: as noted in HPI Dermatological ROS: negative for rash and skin lesion  changes  Physical Examination: Blood pressure 165/96, pulse 97, temperature 97.9 F (36.6 C), temperature source Oral, resp. rate 26, height 6' (1.829 m), weight 93.305 kg (205 lb 11.2 oz), SpO2 93 %.  HEENT-  Normocephalic, no lesions, without obvious abnormality.  Normal external eye and conjunctiva.  Normal TM's bilaterally.  Normal auditory canals and external ears. Normal external nose, mucus membranes and septum.  Normal pharynx. Neck supple with no masses, nodes, nodules or enlargement. Cardiovascular - regular rate and rhythm, S1, S2 normal, no murmur, click, rub or gallop Lungs - chest clear, no wheezing, rales, normal symmetric air entry Abdomen - soft, non-tender; bowel sounds normal; no masses,  no organomegaly Extremities - no joint deformities, effusion, or inflammation and no edema  Neurologic Examination: Alert, oriented, thought content appropriate.  Speech fluent without evidence of aphasia. Able to follow commands without difficulty. Cranial Nerves: II-Visual fields were normal. III/IV/VI-Pupils were equal and reacted normally to light. Extraocular movements were full and conjugate.    V/VII-no facial numbness and no facial weakness. VIII-normal. X-normal speech and symmetrical palatal movement. XI: trapezius strength/neck flexion strength normal bilaterally XII-midline tongue extension with normal strength. Motor: Minimal drift of left upper extremity with complaint of pain shoulder with extension of his arm; no weakness of left lower extremity; normal strength of right extremities. Sensory: Normal throughout. Deep Tendon Reflexes: Trace to 1+ and symmetric in upper extremities and absent in lower extremities. Plantars: Mute bilaterally Cerebellar: Normal finger-to-nose testing with use of right upper extremity; impaired on the left, commensurate with left shoulder pain with movement. Carotid auscultation: Normal  Results for orders placed or performed during the  hospital encounter of 03/31/15 (from the past 48 hour(s))  CBC with Differential     Status: Abnormal   Collection Time: 03/31/15 10:38 PM  Result Value Ref Range   WBC 9.1 4.0 - 10.5 K/uL   RBC 4.23 4.22 - 5.81 MIL/uL   Hemoglobin 10.9 (L) 13.0 - 17.0 g/dL   HCT 34.7 (L) 39.0 - 52.0 %   MCV 82.0 78.0 - 100.0 fL   MCH 25.8 (L) 26.0 - 34.0 pg   MCHC 31.4 30.0 - 36.0 g/dL   RDW 18.4 (H) 11.5 - 15.5 %   Platelets 189 150 - 400 K/uL   Neutrophils Relative % 77 43 - 77 %   Neutro Abs 6.9 1.7 - 7.7 K/uL   Lymphocytes Relative 15 12 - 46 %   Lymphs Abs 1.3 0.7 - 4.0 K/uL   Monocytes Relative 7 3 - 12 %   Monocytes Absolute 0.6 0.1 - 1.0 K/uL   Eosinophils Relative 2 0 - 5 %   Eosinophils Absolute 0.2 0.0 - 0.7 K/uL   Basophils Relative 0 0 - 1 %   Basophils Absolute 0.0 0.0 - 0.1 K/uL  Comprehensive metabolic panel     Status: Abnormal   Collection Time: 03/31/15 10:38 PM  Result Value Ref Range   Sodium 140 135 - 145 mmol/L   Potassium 4.5 3.5 - 5.1 mmol/L   Chloride 97 (L) 101 - 111 mmol/L   CO2 27 22 - 32 mmol/L   Glucose, Bld 106 (H) 65 - 99 mg/dL   BUN 52 (H) 6 - 20 mg/dL   Creatinine, Ser 11.42 (H) 0.61 - 1.24 mg/dL   Calcium 10.0 8.9 -  10.3 mg/dL   Total Protein 7.4 6.5 - 8.1 g/dL   Albumin 3.7 3.5 - 5.0 g/dL   AST 12 (L) 15 - 41 U/L   ALT 12 (L) 17 - 63 U/L   Alkaline Phosphatase 78 38 - 126 U/L   Total Bilirubin 0.7 0.3 - 1.2 mg/dL   GFR calc non Af Amer 4 (L) >60 mL/min   GFR calc Af Amer 5 (L) >60 mL/min    Comment: (NOTE) The eGFR has been calculated using the CKD EPI equation. This calculation has not been validated in all clinical situations. eGFR's persistently <60 mL/min signify possible Chronic Kidney Disease.    Anion gap 16 (H) 5 - 15  CK     Status: None   Collection Time: 03/31/15 11:16 PM  Result Value Ref Range   Total CK 72 49 - 397 U/L  I-stat troponin, ED     Status: None   Collection Time: 03/31/15 11:50 PM  Result Value Ref Range   Troponin  i, poc 0.03 0.00 - 0.08 ng/mL   Comment 3            Comment: Due to the release kinetics of cTnI, a negative result within the first hours of the onset of symptoms does not rule out myocardial infarction with certainty. If myocardial infarction is still suspected, repeat the test at appropriate intervals.    Dg Chest 2 View  04/01/2015   CLINICAL DATA:  Patient with left-sided weakness.  EXAM: CHEST  2 VIEW  COMPARISON:  Chest radiograph 12/2014  FINDINGS: Stable cardiomegaly. Bilateral predominately perihilar interstitial pulmonary opacities. No definite pleural effusion or pneumothorax. Mid thoracic spine degenerative changes.  IMPRESSION: Cardiomegaly with mild interstitial edema.   Electronically Signed   By: Lovey Newcomer M.D.   On: 04/01/2015 00:58   Ct Head Wo Contrast  04/01/2015   CLINICAL DATA:  Patient with left arm and leg weakness.  EXAM: CT HEAD WITHOUT CONTRAST  TECHNIQUE: Contiguous axial images were obtained from the base of the skull through the vertex without intravenous contrast.  COMPARISON:  None.  FINDINGS: Ventricles and sulci are appropriate for patient's age. Periventricular and subcortical white matter hypodensities compatible with chronic small vessel ischemic changes. No evidence for acute cortically based infarct, intracranial hemorrhage, mass lesion or mass-effect. Calvarium intact. Paranasal sinuses unremarkable. Mastoid air cells unremarkable.  IMPRESSION: No acute intracranial process.  Chronic small vessel ischemic changes.   Electronically Signed   By: Lovey Newcomer M.D.   On: 04/01/2015 01:11   Mr Brain Wo Contrast  04/01/2015   CLINICAL DATA:  Acute onset LEFT-sided weakness 20 hours ago, unable to go to dialysis today. Weakness. History of hypertension, end-stage renal disease, diabetes.  EXAM: MRI HEAD WITHOUT CONTRAST  MRA HEAD WITHOUT CONTRAST  TECHNIQUE: Multiplanar, multiecho pulse sequences of the brain and surrounding structures were obtained without  intravenous contrast. Angiographic images of the head were obtained using MRA technique without contrast.  COMPARISON:  CT head April 01, 2015 0016 hours  FINDINGS: MRI HEAD FINDINGS  No reduced diffusion to suggest acute ischemia. No susceptibility artifact to suggest hemorrhage. Confluent T2 hyperintense signal within the pons. Patchy to confluent supratentorial white matter T2 hyperintense signal. 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 No midline shift or mass effect.  No abnormal extra-axial fluid collections. Ocular globes and orbital contents are unremarkable. Paranasal sinuses and mastoid air cells are well aerated. No abnormal sellar expansion. No cerebellar tonsillar ectopia. No suspicious calvarial bone marrow signal.  Patient is edentulous.  MRA HEAD FINDINGS  Anterior circulation: Normal flow related enhancement of the included cervical, petrous, cavernous and supra clinoid internal carotid arteries. Patent anterior communicating artery. Normal flow related enhancement of the anterior and middle cerebral arteries, including more distal segments.  No large vessel occlusion, high-grade stenosis, abnormal luminal irregularity, aneurysm.  Posterior circulation: Codominant vertebral arteries. Basilar artery is patent, with normal flow related enhancement of the main branch vessels. Small RIGHT posterior communicating arteries present. Normal flow related enhancement of the posterior cerebral arteries. Mild stenosis LEFT P3 segment.  No large vessel occlusion, high-grade stenosis, abnormal luminal irregularity, aneurysm.  IMPRESSION: MRI HEAD: No acute intracranial process, specifically no acute ischemia.  Moderate to severe white matter changes compatible with chronic small vessel ischemic disease. 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  MRA HEAD: No acute large vessel occlusion or high-grade stenosis. Mild stenosis proximal LEFT P3 segment.   Electronically Signed   By: Elon Alas M.D.   On: 04/01/2015 04:15    Mr Jodene Nam Head/brain Wo Cm  04/01/2015   CLINICAL DATA:  Acute onset LEFT-sided weakness 20 hours ago, unable to go to dialysis today. Weakness. History of hypertension, end-stage renal disease, diabetes.  EXAM: MRI HEAD WITHOUT CONTRAST  MRA HEAD WITHOUT CONTRAST  TECHNIQUE: Multiplanar, multiecho pulse sequences of the brain and surrounding structures were obtained without intravenous contrast. Angiographic images of the head were obtained using MRA technique without contrast.  COMPARISON:  CT head April 01, 2015 0016 hours  FINDINGS: MRI HEAD FINDINGS  No reduced diffusion to suggest acute ischemia. No susceptibility artifact to suggest hemorrhage. Confluent T2 hyperintense signal within the pons. Patchy to confluent supratentorial white matter T2 hyperintense signal. 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 No midline shift or mass effect.  No abnormal extra-axial fluid collections. Ocular globes and orbital contents are unremarkable. Paranasal sinuses and mastoid air cells are well aerated. No abnormal sellar expansion. No cerebellar tonsillar ectopia. No suspicious calvarial bone marrow signal. Patient is edentulous.  MRA HEAD FINDINGS  Anterior circulation: Normal flow related enhancement of the included cervical, petrous, cavernous and supra clinoid internal carotid arteries. Patent anterior communicating artery. Normal flow related enhancement of the anterior and middle cerebral arteries, including more distal segments.  No large vessel occlusion, high-grade stenosis, abnormal luminal irregularity, aneurysm.  Posterior circulation: Codominant vertebral arteries. Basilar artery is patent, with normal flow related enhancement of the main branch vessels. Small RIGHT posterior communicating arteries present. Normal flow related enhancement of the posterior cerebral arteries. Mild stenosis LEFT P3 segment.  No large vessel occlusion, high-grade stenosis, abnormal luminal irregularity, aneurysm.  IMPRESSION: MRI  HEAD: No acute intracranial process, specifically no acute ischemia.  Moderate to severe white matter changes compatible with chronic small vessel ischemic disease. 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  MRA HEAD: No acute large vessel occlusion or high-grade stenosis. Mild stenosis proximal LEFT P3 segment.   Electronically Signed   By: Elon Alas M.D.   On: 04/01/2015 04:15    Assessment: 59 y.o. male with multiple risk factors for stroke presenting with several TIA is etiology for transient left upper and lower extremity weakness, which has essentially resolved at this point. No acute stroke was demonstrated on MRI study.  Stroke Risk Factors - diabetes mellitus, family history and hypertension  Plan: 1. HgbA1c, fasting lipid panel 2. PT consult, OT consult, Speech consult 3. Echocardiogram 4. Carotid dopplers 5. Prophylactic therapy-Antiplatelet med: Aspirin  6. Risk factor modification 7. Telemetry monitoring  C.R. Nicole Kindred, MD  [facility] [phone]  04/01/2015, 4:42 AM

## 2015-04-02 DIAGNOSIS — I120 Hypertensive chronic kidney disease with stage 5 chronic kidney disease or end stage renal disease: Secondary | ICD-10-CM | POA: Diagnosis not present

## 2015-04-02 DIAGNOSIS — R1110 Vomiting, unspecified: Secondary | ICD-10-CM | POA: Diagnosis not present

## 2015-04-02 DIAGNOSIS — N186 End stage renal disease: Secondary | ICD-10-CM | POA: Diagnosis not present

## 2015-04-02 DIAGNOSIS — G459 Transient cerebral ischemic attack, unspecified: Secondary | ICD-10-CM | POA: Diagnosis not present

## 2015-04-02 DIAGNOSIS — I10 Essential (primary) hypertension: Secondary | ICD-10-CM | POA: Diagnosis not present

## 2015-04-02 DIAGNOSIS — E1122 Type 2 diabetes mellitus with diabetic chronic kidney disease: Secondary | ICD-10-CM | POA: Diagnosis not present

## 2015-04-02 LAB — GLUCOSE, CAPILLARY
GLUCOSE-CAPILLARY: 110 mg/dL — AB (ref 65–99)
Glucose-Capillary: 123 mg/dL — ABNORMAL HIGH (ref 65–99)
Glucose-Capillary: 157 mg/dL — ABNORMAL HIGH (ref 65–99)
Glucose-Capillary: 113 mg/dL — ABNORMAL HIGH (ref 65–99)

## 2015-04-02 LAB — HEMOGLOBIN A1C
Hgb A1c MFr Bld: 5.7 % — ABNORMAL HIGH (ref 4.8–5.6)
Mean Plasma Glucose: 117 mg/dL

## 2015-04-02 LAB — TROPONIN I
Troponin I: 0.03 ng/mL (ref <0.031)
Troponin I: 0.03 ng/mL (ref <0.031)

## 2015-04-02 NOTE — Progress Notes (Signed)
PT Cancellation Note  Patient Details Name: [name] [name] MRN: [id number] DOB: 06/12/1956   Cancelled Treatment:    Reason Eval/Treat Not Completed: Fatigue/lethargy limiting ability to participate Followed up with patient after dialysis this afternoon for PT evaluation. Reports he is too fatigued to participate at this time. Requests PT return tomorrow. Will arrange for PT Evaluation as soon as possible tomorrow 6/26.  Ellouise Newer 04/02/2015, 5:19 PM Camille Bal [place], [place]

## 2015-04-02 NOTE — Progress Notes (Signed)
Subjective:  Had HD yest- removed 4 liters- resting- pleasant - no complaints Objective Vital signs in last 24 hours: Filed Vitals:   04/01/15 1850 04/01/15 2104 04/02/15 0120 04/02/15 0511  BP: 158/91 157/86 151/91 147/81  Pulse: 88 91 95 100  Temp: 96.9 F (36.1 C) 98.3 F (36.8 C) 98.3 F (36.8 C) 98 F (36.7 C)  TempSrc: Oral Oral Oral Oral  Resp: 20 20 20 20   Height:      Weight:      SpO2:  96% 97% 97%   Weight change: 0.32 kg (11.3 oz)  Intake/Output Summary (Last 24 hours) at 04/02/15 [id number] Last data filed at 04/01/15 1850  Gross per 24 hour  Intake      0 ml  Output   4000 ml  Net  -4000 ml   Dialysis Orders: AF TTS 4 hr 180 500/800 EDW 88 2 K 2.25 Ca heparin 8000 AVF Hectorol 4 venofer 50 per week /last given 6/16 Mircera 100 q 2 weeks last given 6/14 Recent labs: Hgb 10.5 6/16 19% sat 5/26 and ferritin 1588 5/19 Ca 10 P 9.6 iPTH 526 5/26  Assessment/Plan: 1. 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 ESRD - TTS - missed HD Thursday- had HD Friday-  Do again today to get back on schedule and can remove more volume-  3. Hypertension/volume - mild interstitial edema on CXR -generally gets to edw with post HD BP 140 - 150s standing - may need slightly lower EDW - UF goals 2-4 kg on average - some of BP issue may be due to excess volume- needs updated BP med list at HD after discharge -  4. Anemia -Hgb 10.0 - due for redosing of ESA 6/28 next week - will order then if still here- hold Fe due to ^ ferritin 5. Metabolic bone disease - noncompliant with binders as outpt as evidenced by last P of 10, continue Hectorol- he reports he is "supposed to take 2 phoslo ac, 3 renvela ac and 90 sensipar per day" -needs updated med list at d/c 6. Nutrition - renal carb mod diet/vitamin 7. CAD - on asa, plavix, ranexa and BB 8. DM - per primary 9. GERD -  on PPI and carafate - should not be on carafate > 1 month due to ^ Al content 10.     [name] [name] A    Labs: Basic Metabolic Panel:  Recent Labs Lab 03/31/15 2238 04/01/15 0630  NA 140 137  K 4.5 3.9  CL 97* 97*  CO2 27 23  GLUCOSE 106* 102*  BUN 52* 53*  CREATININE 11.42* 11.85*  CALCIUM 10.0 9.4   Liver Function Tests:  Recent Labs Lab 03/31/15 2238 04/01/15 0630  AST 12* 8*  ALT 12* 11*  ALKPHOS 78 60  BILITOT 0.7 0.8  PROT 7.4 6.9  ALBUMIN 3.7 3.3*   No results for input(s): LIPASE, AMYLASE in the last 168 hours. No results for input(s): AMMONIA in the last 168 hours. CBC:  Recent Labs Lab 03/31/15 2238 04/01/15 0630  WBC 9.1 6.1  NEUTROABS 6.9  --   HGB 10.9* 10.0*  HCT 34.7* 31.2*  MCV 82.0 81.3  PLT 189 154   Cardiac Enzymes:  Recent Labs Lab 03/31/15 2316 04/01/15 0630  CKTOTAL 72  --   TROPONINI  --  0.05*   CBG:  Recent Labs Lab 04/01/15 [phone]  04/01/15 1155 04/01/15 2209 04/02/15 0632  GLUCAP 101* 109* 146* 110*    Iron Studies: No results for input(s): IRON, TIBC, TRANSFERRIN, FERRITIN in the last 72 hours. Studies/Results: Dg Chest 2 View  04/01/2015   CLINICAL DATA:  Patient with left-sided weakness.  EXAM: CHEST  2 VIEW  COMPARISON:  Chest radiograph 12/2014  FINDINGS: Stable cardiomegaly. Bilateral predominately perihilar interstitial pulmonary opacities. No definite pleural effusion or pneumothorax. Mid thoracic spine degenerative changes.  IMPRESSION: Cardiomegaly with mild interstitial edema.   Electronically Signed   By: Lovey Newcomer M.D.   On: 04/01/2015 00:58   Ct Head Wo Contrast  04/01/2015   CLINICAL DATA:  Patient with left arm and leg weakness.  EXAM: CT HEAD WITHOUT CONTRAST  TECHNIQUE: Contiguous axial images were obtained from the base of the skull through the vertex without intravenous contrast.  COMPARISON:  None.  FINDINGS: Ventricles and sulci are appropriate for patient's age. Periventricular and  subcortical white matter hypodensities compatible with chronic small vessel ischemic changes. No evidence for acute cortically based infarct, intracranial hemorrhage, mass lesion or mass-effect. Calvarium intact. Paranasal sinuses unremarkable. Mastoid air cells unremarkable.  IMPRESSION: No acute intracranial process.  Chronic small vessel ischemic changes.   Electronically Signed   By: Lovey Newcomer M.D.   On: 04/01/2015 01:11   Mr Brain Wo Contrast  04/01/2015   CLINICAL DATA:  Acute onset LEFT-sided weakness 20 hours ago, unable to go to dialysis today. Weakness. History of hypertension, end-stage renal disease, diabetes.  EXAM: MRI HEAD WITHOUT CONTRAST  MRA HEAD WITHOUT CONTRAST  TECHNIQUE: Multiplanar, multiecho pulse sequences of the brain and surrounding structures were obtained without intravenous contrast. Angiographic images of the head were obtained using MRA technique without contrast.  COMPARISON:  CT head April 01, 2015 0016 hours  FINDINGS: MRI HEAD FINDINGS  No reduced diffusion to suggest acute ischemia. No susceptibility artifact to suggest hemorrhage. Confluent T2 hyperintense signal within the pons. Patchy to confluent supratentorial white matter T2 hyperintense signal. 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 No midline shift or mass effect.  No abnormal extra-axial fluid collections. Ocular globes and orbital contents are unremarkable. Paranasal sinuses and mastoid air cells are well aerated. No abnormal sellar expansion. No cerebellar tonsillar ectopia. No suspicious calvarial bone marrow signal. Patient is edentulous.  MRA HEAD FINDINGS  Anterior circulation: Normal flow related enhancement of the included cervical, petrous, cavernous and supra clinoid internal carotid arteries. Patent anterior communicating artery. Normal flow related enhancement of the anterior and middle cerebral arteries, including more distal segments.  No large vessel occlusion, high-grade stenosis, abnormal luminal  irregularity, aneurysm.  Posterior circulation: Codominant vertebral arteries. Basilar artery is patent, with normal flow related enhancement of the main branch vessels. Small RIGHT posterior communicating arteries present. Normal flow related enhancement of the posterior cerebral arteries. Mild stenosis LEFT P3 segment.  No large vessel occlusion, high-grade stenosis, abnormal luminal irregularity, aneurysm.  IMPRESSION: MRI HEAD: No acute intracranial process, specifically no acute ischemia.  Moderate to severe white matter changes compatible with chronic small vessel ischemic disease. 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  MRA HEAD: No acute large vessel occlusion or high-grade stenosis. Mild stenosis proximal LEFT P3 segment.   Electronically Signed   By: Elon Alas M.D.   On: 04/01/2015 04:15   Mr Jodene Nam Head/brain Wo Cm  04/01/2015   CLINICAL DATA:  Acute onset LEFT-sided weakness 20 hours ago, unable to go to dialysis today. Weakness. History of hypertension, end-stage renal disease, diabetes.  EXAM:  MRI HEAD WITHOUT CONTRAST  MRA HEAD WITHOUT CONTRAST  TECHNIQUE: Multiplanar, multiecho pulse sequences of the brain and surrounding structures were obtained without intravenous contrast. Angiographic images of the head were obtained using MRA technique without contrast.  COMPARISON:  CT head April 01, 2015 0016 hours  FINDINGS: MRI HEAD FINDINGS  No reduced diffusion to suggest acute ischemia. No susceptibility artifact to suggest hemorrhage. Confluent T2 hyperintense signal within the pons. Patchy to confluent supratentorial white matter T2 hyperintense signal. 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 No midline shift or mass effect.  No abnormal extra-axial fluid collections. Ocular globes and orbital contents are unremarkable. Paranasal sinuses and mastoid air cells are well aerated. No abnormal sellar expansion. No cerebellar tonsillar ectopia. No suspicious calvarial bone marrow signal. Patient is  edentulous.  MRA HEAD FINDINGS  Anterior circulation: Normal flow related enhancement of the included cervical, petrous, cavernous and supra clinoid internal carotid arteries. Patent anterior communicating artery. Normal flow related enhancement of the anterior and middle cerebral arteries, including more distal segments.  No large vessel occlusion, high-grade stenosis, abnormal luminal irregularity, aneurysm.  Posterior circulation: Codominant vertebral arteries. Basilar artery is patent, with normal flow related enhancement of the main branch vessels. Small RIGHT posterior communicating arteries present. Normal flow related enhancement of the posterior cerebral arteries. Mild stenosis LEFT P3 segment.  No large vessel occlusion, high-grade stenosis, abnormal luminal irregularity, aneurysm.  IMPRESSION: MRI HEAD: No acute intracranial process, specifically no acute ischemia.  Moderate to severe white matter changes compatible with chronic small vessel ischemic disease. 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  MRA HEAD: No acute large vessel occlusion or high-grade stenosis. Mild stenosis proximal LEFT P3 segment.   Electronically Signed   By: Elon Alas M.D.   On: 04/01/2015 04:15   Medications: Infusions:    Scheduled Medications: . amitriptyline  100 mg Oral QHS  . amLODipine  10 mg Oral Daily  . aspirin  300 mg Rectal Daily   Or  . aspirin  325 mg Oral Daily  . atorvastatin  20 mg Oral Daily  . calcium acetate  1,334 mg Oral TID WC  . carvedilol  50 mg Oral BID WC  . cinacalcet  90 mg Oral Q breakfast  . clopidogrel  75 mg Oral Daily  . doxercalciferol  4 mcg Intravenous Once  . feeding supplement (NEPRO CARB STEADY)  237 mL Oral BID BM  . finasteride  5 mg Oral Daily  . heparin  5,000 Units Subcutaneous 3 times per day  . insulin aspart  0-9 Units Subcutaneous TID WC  . multivitamin  1 tablet Oral QHS  . pantoprazole  40 mg Oral Daily  . pregabalin  50 mg Oral Daily  . ranolazine   500 mg Oral BID  . sevelamer carbonate  2,400 mg Oral TID WC  . sucralfate  1 g Oral TID WC & HS  . venlafaxine  37.5 mg Oral BID WC    have reviewed scheduled and prn medications.  Physical Exam: General: NAD Heart: RRR Lungs: mostly clear Abdomen: soft, non tender Extremities: no peripheral edema Dialysis Access: AVF - L   04/02/2015,8:32 AM

## 2015-04-02 NOTE — Progress Notes (Signed)
PROGRESS NOTE  [name] [name] [id number] DOB: 07/27/1956 DOA: 03/31/2015 PCP: [name]   Brief history 59 year old male with a history of diabetes mellitus, ESRD, CAD, diastolic CHF, hypertension, COPD presented with left-sided weakness affecting his left arm and left leg began in the morning of 03/31/2015 when he woke up. Apparently, the patient was unsteady getting up because of his weakness. The patient also missed his dialysis treatment and his outpatient Center on 03/31/2015. He denied any fevers, chills, chest pain, shortness breath, nausea, vomiting. Neurology was consulted and recommended full stroke Workup.   Assessment/Plan: TIA -appreciate neurology -MRI brain No acute intracranial process, specifically no acute ischemia.  -MRA brain No acute large vessel occlusion or high-grade stenosis. -Carotid duplex negative for hemodynamically significant stenosis -12/06/2014 echocardiogram shows EF [id number], grade 1 diastolic dysfunction, no WMA -12/13/2014 hemoglobin A1c 5.5 -LDL 48  -continue aspirin and plavix Hypertensive urgency  -Patient had blood pressure 192/104 in the emergency department  -Continue amlodipine, carvedilol  ESRD  -Patient received dialysis 04/01/2015  and 03/02/2015 -Appreciate nephrology  GERD  -Continue Carafate and PPI  Coronary artery disease  -History of PCI 09 Oct 2010  -February 2016 cardiac catheterization negative for obstructive lesions  -Continue aspirin, Plavix, Ranexa Hyperlipidemia  -Continue Lipitor 20 mg daily  -LDL 48  Diabetes mellitus type 2  -12/13/2014 hemoglobin A1c 5.5  -hold lantus 10 units as pt is vomiting -CBGs controlled Vomiting -?previously improved with carafate -?related to missing HD -?related to taking too many pills at one time-- patient seems to have vomiting after taking many pills -continue carafate and protonix -6/25pt ate 100% of his dinner without vomiting -check LFTs and  lipase in am - 12/13/2014 gastric emptying study within normal limits -  02/23/2015 RUQ [place]-- negative for cholecystitis or cholelithiasis  Family Communication: Pt at beside Disposition Plan: Home when medically stable   Procedures/Studies: Dg Chest 2 View  04/01/2015   CLINICAL DATA:  Patient with left-sided weakness.  EXAM: CHEST  2 VIEW  COMPARISON:  Chest radiograph 12/2014  FINDINGS: Stable cardiomegaly. Bilateral predominately perihilar interstitial pulmonary opacities. No definite pleural effusion or pneumothorax. Mid thoracic spine degenerative changes.  IMPRESSION: Cardiomegaly with mild interstitial edema.   Electronically Signed   By: Lovey Newcomer M.D.   On: 04/01/2015 00:58   Ct Head Wo Contrast  04/01/2015   CLINICAL DATA:  Patient with left arm and leg weakness.  EXAM: CT HEAD WITHOUT CONTRAST  TECHNIQUE: Contiguous axial images were obtained from the base of the skull through the vertex without intravenous contrast.  COMPARISON:  None.  FINDINGS: Ventricles and sulci are appropriate for patient's age. Periventricular and subcortical white matter hypodensities compatible with chronic small vessel ischemic changes. No evidence for acute cortically based infarct, intracranial hemorrhage, mass lesion or mass-effect. Calvarium intact. Paranasal sinuses unremarkable. Mastoid air cells unremarkable.  IMPRESSION: No acute intracranial process.  Chronic small vessel ischemic changes.   Electronically Signed   By: Lovey Newcomer M.D.   On: 04/01/2015 01:11   Mr Brain Wo Contrast  04/01/2015   CLINICAL DATA:  Acute onset LEFT-sided weakness 20 hours ago, unable to go to dialysis today. Weakness. History of hypertension, end-stage renal disease, diabetes.  EXAM: MRI HEAD WITHOUT CONTRAST  MRA HEAD WITHOUT CONTRAST  TECHNIQUE: Multiplanar, multiecho pulse sequences of the brain and surrounding structures were obtained without intravenous contrast. Angiographic images of the head were obtained using  MRA technique without contrast.  COMPARISON:  CT head April 01, 2015 0016 hours  FINDINGS: MRI HEAD FINDINGS  No reduced diffusion to suggest acute ischemia. No susceptibility artifact to suggest hemorrhage. Confluent T2 hyperintense signal within the pons. Patchy to confluent supratentorial white matter T2 hyperintense signal. 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 No midline shift or mass effect.  No abnormal extra-axial fluid collections. Ocular globes and orbital contents are unremarkable. Paranasal sinuses and mastoid air cells are well aerated. No abnormal sellar expansion. No cerebellar tonsillar ectopia. No suspicious calvarial bone marrow signal. Patient is edentulous.  MRA HEAD FINDINGS  Anterior circulation: Normal flow related enhancement of the included cervical, petrous, cavernous and supra clinoid internal carotid arteries. Patent anterior communicating artery. Normal flow related enhancement of the anterior and middle cerebral arteries, including more distal segments.  No large vessel occlusion, high-grade stenosis, abnormal luminal irregularity, aneurysm.  Posterior circulation: Codominant vertebral arteries. Basilar artery is patent, with normal flow related enhancement of the main branch vessels. Small RIGHT posterior communicating arteries present. Normal flow related enhancement of the posterior cerebral arteries. Mild stenosis LEFT P3 segment.  No large vessel occlusion, high-grade stenosis, abnormal luminal irregularity, aneurysm.  IMPRESSION: MRI HEAD: No acute intracranial process, specifically no acute ischemia.  Moderate to severe white matter changes compatible with chronic small vessel ischemic disease. 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  MRA HEAD: No acute large vessel occlusion or high-grade stenosis. Mild stenosis proximal LEFT P3 segment.   Electronically Signed   By: Elon Alas M.D.   On: 04/01/2015 04:15   Mr Jodene Nam Head/brain Wo Cm  04/01/2015   CLINICAL DATA:  Acute onset  LEFT-sided weakness 20 hours ago, unable to go to dialysis today. Weakness. History of hypertension, end-stage renal disease, diabetes.  EXAM: MRI HEAD WITHOUT CONTRAST  MRA HEAD WITHOUT CONTRAST  TECHNIQUE: Multiplanar, multiecho pulse sequences of the brain and surrounding structures were obtained without intravenous contrast. Angiographic images of the head were obtained using MRA technique without contrast.  COMPARISON:  CT head April 01, 2015 0016 hours  FINDINGS: MRI HEAD FINDINGS  No reduced diffusion to suggest acute ischemia. No susceptibility artifact to suggest hemorrhage. Confluent T2 hyperintense signal within the pons. Patchy to confluent supratentorial white matter T2 hyperintense signal. 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 No midline shift or mass effect.  No abnormal extra-axial fluid collections. Ocular globes and orbital contents are unremarkable. Paranasal sinuses and mastoid air cells are well aerated. No abnormal sellar expansion. No cerebellar tonsillar ectopia. No suspicious calvarial bone marrow signal. Patient is edentulous.  MRA HEAD FINDINGS  Anterior circulation: Normal flow related enhancement of the included cervical, petrous, cavernous and supra clinoid internal carotid arteries. Patent anterior communicating artery. Normal flow related enhancement of the anterior and middle cerebral arteries, including more distal segments.  No large vessel occlusion, high-grade stenosis, abnormal luminal irregularity, aneurysm.  Posterior circulation: Codominant vertebral arteries. Basilar artery is patent, with normal flow related enhancement of the main branch vessels. Small RIGHT posterior communicating arteries present. Normal flow related enhancement of the posterior cerebral arteries. Mild stenosis LEFT P3 segment.  No large vessel occlusion, high-grade stenosis, abnormal luminal irregularity, aneurysm.  IMPRESSION: MRI HEAD: No acute intracranial process, specifically no acute ischemia.   Moderate to severe white matter changes compatible with chronic small vessel ischemic disease. 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  MRA HEAD: No acute large vessel occlusion or high-grade stenosis. Mild stenosis proximal LEFT P3 segment.   Electronically Signed   By: Elon Alas M.D.   On:  04/01/2015 04:15         Subjective:  patient had an episode of emesis after his pills this morning. Hour after dialysis, the patient is a 100% at this tender without any emesis or abdominal pain. Denies any fevers, chills, chest pain, shortness breath, headache,  Diarrhea.  Objective: Filed Vitals:   04/02/15 1330 04/02/15 1400 04/02/15 1430 04/02/15 1500  BP: 134/80 131/74 143/81 147/77  Pulse: 98 98 96 98  Temp:      TempSrc:      Resp:      Height:      Weight:      SpO2:        Intake/Output Summary (Last 24 hours) at 04/02/15 1524 Last data filed at 04/01/15 1850  Gross per 24 hour  Intake      0 ml  Output   4000 ml  Net  -4000 ml   Weight change: 0.32 kg (11.3 oz) Exam:   General:  Pt is alert, follows commands appropriately, not in acute distress  HEENT: No icterus, No thrush,  [place]/AT  Cardiovascular: RRR, S1/S2, no rubs, no gallops  Respiratory: CTA bilaterally, no wheezing, no crackles, no rhonchi  Abdomen: Soft/+BS, non tender, non distended, no guarding  Extremities: No edema, No lymphangitis, No petechiae, No rashes, no synovitis  Data Reviewed: Basic Metabolic Panel:  Recent Labs Lab 03/31/15 2238 04/01/15 0630  NA 140 137  K 4.5 3.9  CL 97* 97*  CO2 27 23  GLUCOSE 106* 102*  BUN 52* 53*  CREATININE 11.42* 11.85*  CALCIUM 10.0 9.4   Liver Function Tests:  Recent Labs Lab 03/31/15 2238 04/01/15 0630  AST 12* 8*  ALT 12* 11*  ALKPHOS 78 60  BILITOT 0.7 0.8  PROT 7.4 6.9  ALBUMIN 3.7 3.3*   No results for input(s): LIPASE, AMYLASE in the last 168 hours. No results for input(s): AMMONIA in the last 168 hours. CBC:  Recent Labs Lab  03/31/15 2238 04/01/15 0630  WBC 9.1 6.1  NEUTROABS 6.9  --   HGB 10.9* 10.0*  HCT 34.7* 31.2*  MCV 82.0 81.3  PLT 189 154   Cardiac Enzymes:  Recent Labs Lab 03/31/15 2316 04/01/15 0630 04/02/15 1145  CKTOTAL 72  --   --   TROPONINI  --  0.05* 0.03   BNP: Invalid input(s): POCBNP CBG:  Recent Labs Lab 04/01/15 0623 04/01/15 1155 04/01/15 2209 04/02/15 0632  GLUCAP 101* 109* 146* 110*    No results found for this or any previous visit (from the past 240 hour(s)).   Scheduled Meds: . amitriptyline  100 mg Oral QHS  . amLODipine  10 mg Oral Daily  . aspirin  300 mg Rectal Daily   Or  . aspirin  325 mg Oral Daily  . atorvastatin  20 mg Oral Daily  . calcium acetate  1,334 mg Oral TID WC  . carvedilol  50 mg Oral BID WC  . cinacalcet  90 mg Oral Q breakfast  . clopidogrel  75 mg Oral Daily  . doxercalciferol  4 mcg Intravenous Once  . feeding supplement (NEPRO CARB STEADY)  237 mL Oral BID BM  . finasteride  5 mg Oral Daily  . heparin  5,000 Units Subcutaneous 3 times per day  . insulin aspart  0-9 Units Subcutaneous TID WC  . multivitamin  1 tablet Oral QHS  . pantoprazole  40 mg Oral Daily  . pregabalin  50 mg Oral Daily  . ranolazine  500  mg Oral BID  . sevelamer carbonate  2,400 mg Oral TID WC  . sucralfate  1 g Oral TID WC & HS  . venlafaxine  37.5 mg Oral BID WC   Continuous Infusions:    Kerri Kovacik, DO  [facility] Pager [phone]  If 7PM-7AM, please contact night-coverage [email or web address] Password [facility] 04/02/2015, 3:24 PM

## 2015-04-02 NOTE — Progress Notes (Signed)
SLP Cancellation Note  Patient Details Name: [name] [name] MRN: [id number] DOB: 1956-07-14   Cancelled treatment:        Patient at procedure/ unavailable. Will re attempt in the morning   Arvil Chaco MA, [place], [place] 04/02/2015, 4:06 PM

## 2015-04-02 NOTE — Progress Notes (Signed)
Hemoglobin A1c 5.7.  The stroke team will sign off at this time. Please call if we can be of further service.  Mikey Bussing PA-C [facility] Pager [phone] 04/02/2015, 12:31 PM

## 2015-04-02 NOTE — Progress Notes (Signed)
Occupational Therapy Evaluation Patient Details Name: [name] [name] MRN: [id number] DOB: [birth year] Today's Date: 04/02/2015    History of Present Illness 59 year old male with a history of diabetes mellitus, ESRD, CAD, diastolic CHF, hypertension, COPD presented with left-sided weakness affecting his left arm and left leg began in the morning of 03/31/2015 when he woke up. Apparently, the patient was unsteady in normal sterile because of his weakness. The patient also missed his dialysis treatment and his outpatient Center on 03/31/2015.  Currently undergoing stroke work up.   Clinical Impression   Patient c/o chest discomfort as well as generalized discomfort throughout body today.  RN is aware and ran an EKG prior to OT eval (EKG normal per RN report).  Patient presents with generalized weakness but also demonstrates decreased strength in left UE compared to right UE.  Somewhat restless during session due to discomfort.  Kept verbalizing that he just spent too much time in the heat and will feel better soon.  Recommend acute OT services to address problem list (listed below) in order to improve safety and independence with ADLs.  Recommending [place] [facility] and 24/7 supervision/assistance at home. May need to consider different d/c plan if patient does not improve.     Follow Up Recommendations  Home health OT;Supervision/Assistance - 24 hour    Equipment Recommendations  3 in 1 bedside comode    Recommendations for Other Services PT consult     Precautions / Restrictions Precautions Precautions: Fall      Mobility Bed Mobility Overal bed mobility: Needs Assistance Bed Mobility: Supine to Sit     Supine to sit: Supervision     General bed mobility comments: Supervision for safety.  Transfers Overall transfer level: Needs assistance   Transfers: Sit to/from Stand Sit to Stand: Min guard         General transfer comment: Min guard for safety and VCs for safe hand placment  (attempting to place both hands on RW).    Balance Overall balance assessment: Needs assistance         Standing balance support: Bilateral upper extremity supported Standing balance-Leahy Scale: Fair                              ADL Overall ADL's : Needs assistance/impaired     Grooming: Supervision/safety;Set up;Sitting;Wash/dry face   Upper Body Bathing: Supervision/ safety;Set up;Sitting   Lower Body Bathing: Sit to/from stand;Minimal assistance   Upper Body Dressing : Supervision/safety;Set up;Sitting   Lower Body Dressing: Moderate assistance;Sit to/from stand   Toilet Transfer: Min guard;Ambulation;RW           Functional mobility during ADLs: Min guard;Rolling walker General ADL Comments: Patient seems just generally weak and fatigues quickly.  Unable to lift bilateral UEs overhead.  Patient donned socks with assist and ambulated in room for 2 minutes before transferring to recliner.     Vision Vision Assessment?: No apparent visual deficits (will further assess next session)   Perception     Praxis      Pertinent Vitals/Pain Pain Assessment: Faces Faces Pain Scale: Hurts even more Pain Location: chest Pain Descriptors / Indicators: Discomfort Pain Intervention(s): Limited activity within patient's tolerance (RN aware)     Hand Dominance Right   Extremity/Trunk Assessment Upper Extremity Assessment Upper Extremity Assessment: Generalized weakness (Left UE weaker than right UE)   Lower Extremity Assessment Lower Extremity Assessment: Defer to PT evaluation       Communication  Cognition Arousal/Alertness: Awake/alert Behavior During Therapy: Restless (likely due to discomfort) Overall Cognitive Status: Difficult to assess                     General Comments       Exercises       Shoulder Instructions      Home Living Family/patient expects to be discharged to:: Private residence Living Arrangements:  Children Available Help at Discharge: Available PRN/intermittently;Family Type of Home: Apartment Home Access: Level entry     Home Layout: Two level;Able to live on main level with bedroom/bathroom     Bathroom Shower/Tub: Occupational psychologist: Standard     Home Equipment: Environmental consultant - 2 wheels          Prior Functioning/Environment Level of Independence: Independent             OT Diagnosis: Generalized weakness;Paresis   OT Problem List: Decreased strength;Decreased activity tolerance;Decreased knowledge of use of DME or AE;Decreased safety awareness;Impaired UE functional use   OT Treatment/Interventions: Self-care/ADL training;Therapeutic exercise;Energy conservation;DME and/or AE instruction;Therapeutic activities;Patient/family education;Balance training    OT Goals(Current goals can be found in the care plan section) Acute Rehab OT Goals Patient Stated Goal: to go home OT Goal Formulation: With patient Time For Goal Achievement: 04/09/15 Potential to Achieve Goals: Good  OT Frequency: Min 2X/week   Barriers to D/C: Decreased caregiver support  Patient unsure if he will have 24/7 supervision/assist       Co-evaluation              End of Session Equipment Utilized During Treatment: Gait belt;Rolling walker Nurse Communication: Mobility status  Activity Tolerance: Patient limited by fatigue Patient left: in chair;with call bell/phone within reach;with chair alarm set   Time: 1115-1131 OT Time Calculation (min): 16 min Charges:  OT General Charges $OT Visit: 1 Procedure OT Evaluation $Initial OT Evaluation Tier I: 1 Procedure G-Codes: OT G-codes **NOT FOR INPATIENT CLASS** Functional Assessment Tool Used: clinical judgment Functional Limitation: Self care Self Care Current Status [id number]): At least 20 percent but less than 40 percent impaired, limited or restricted Self Care Goal Status [phone]): 0 percent impaired, limited or restricted   04/02/2015 [name] OTR/L Office [phone]

## 2015-04-03 DIAGNOSIS — N186 End stage renal disease: Secondary | ICD-10-CM | POA: Diagnosis not present

## 2015-04-03 DIAGNOSIS — I519 Heart disease, unspecified: Secondary | ICD-10-CM | POA: Diagnosis not present

## 2015-04-03 DIAGNOSIS — I10 Essential (primary) hypertension: Secondary | ICD-10-CM | POA: Diagnosis not present

## 2015-04-03 DIAGNOSIS — G459 Transient cerebral ischemic attack, unspecified: Secondary | ICD-10-CM | POA: Diagnosis not present

## 2015-04-03 LAB — COMPREHENSIVE METABOLIC PANEL
ALK PHOS: 68 U/L (ref 38–126)
ALT: 10 U/L — AB (ref 17–63)
AST: 9 U/L — ABNORMAL LOW (ref 15–41)
Albumin: 3.3 g/dL — ABNORMAL LOW (ref 3.5–5.0)
Anion gap: 12 (ref 5–15)
BUN: 30 mg/dL — ABNORMAL HIGH (ref 6–20)
CO2: 26 mmol/L (ref 22–32)
Calcium: 8.8 mg/dL — ABNORMAL LOW (ref 8.9–10.3)
Chloride: 97 mmol/L — ABNORMAL LOW (ref 101–111)
Creatinine, Ser: 6.66 mg/dL — ABNORMAL HIGH (ref 0.61–1.24)
GFR calc Af Amer: 10 mL/min — ABNORMAL LOW (ref >60)
GFR, EST NON AFRICAN AMERICAN: 8 mL/min — AB (ref >60)
Glucose, Bld: 113 mg/dL — ABNORMAL HIGH (ref 65–99)
Potassium: 4.6 mmol/L (ref 3.5–5.1)
SODIUM: 135 mmol/L (ref 135–145)
TOTAL PROTEIN: 7.1 g/dL (ref 6.5–8.1)
Total Bilirubin: 0.5 mg/dL (ref 0.3–1.2)

## 2015-04-03 LAB — CBC
HEMATOCRIT: 33.4 % — AB (ref 39.0–52.0)
Hemoglobin: 10.5 g/dL — ABNORMAL LOW (ref 13.0–17.0)
MCH: 25.7 pg — ABNORMAL LOW (ref 26.0–34.0)
MCHC: 31.4 g/dL (ref 30.0–36.0)
MCV: 81.9 fL (ref 78.0–100.0)
PLATELETS: 127 10*3/uL — AB (ref 150–400)
RBC: 4.08 MIL/uL — AB (ref 4.22–5.81)
RDW: 18 % — ABNORMAL HIGH (ref 11.5–15.5)
WBC: 5.5 10*3/uL (ref 4.0–10.5)

## 2015-04-03 LAB — GLUCOSE, CAPILLARY
GLUCOSE-CAPILLARY: 116 mg/dL — AB (ref 65–99)
Glucose-Capillary: 101 mg/dL — ABNORMAL HIGH (ref 65–99)

## 2015-04-03 LAB — LIPASE, BLOOD: Lipase: 26 U/L (ref 22–51)

## 2015-04-03 MED ORDER — ASPIRIN 325 MG PO TABS: 325.0000 mg | ORAL_TABLET | Freq: Every day | ORAL | Status: DC

## 2015-04-03 NOTE — Evaluation (Signed)
Physical Therapy Evaluation and Discharge Patient Details Name: [name] [name] MRN: [id number] DOB: 03/10/1956 Today's Date: 04/03/2015   History of Present Illness  59 year old male with a history of diabetes mellitus, ESRD, CAD, diastolic CHF, hypertension, COPD presented with left-sided weakness affecting his left arm and left leg began in the morning of 03/31/2015 when he woke up. Apparently, the patient was unsteady in normal sterile because of his weakness. The patient also missed his dialysis treatment and his outpatient Center on 03/31/2015.  Currently undergoing stroke work up.  Clinical Impression  Patient evaluated by Physical Therapy with no further acute PT needs identified. All education has been completed and the patient has no further questions. Pt with generalized weakness, complaining of left lower back pain, tender to palpation around left gluteus medius origin. Ice applied and reports improvement in symptoms. States he feels quite a bit weaker than baseline as he ambulates 2-3 miles daily. Slow and guarded gait using a rolling walker today. Lives with his son and agrees he would benefit from outpatient PT at discharge to further progress his endurance and functional abilities. Encouraged to use his RW at home until strength and gait improves with OP PT. See below for any follow-up Physial Therapy or equipment needs. PT is signing off. Thank you for this referral.     Follow Up Recommendations Outpatient PT;Supervision - Intermittent (To progress endurance and functional abilites)    Equipment Recommendations  None recommended by PT    Recommendations for Other Services       Precautions / Restrictions Precautions Precautions: Fall Restrictions Weight Bearing Restrictions: No      Mobility  Bed Mobility Overal bed mobility: Modified Independent             General bed mobility comments: requires extra time  Transfers Overall transfer level: Needs  assistance Equipment used: None Transfers: Sit to/from Stand Sit to Stand: Supervision         General transfer comment: Supervision for safety. Pt guarded and slow to rise but fairly stable. Reaching for furniture for support due to pain in back.  Ambulation/Gait Ambulation/Gait assistance: Supervision Ambulation Distance (Feet): 100 Feet Assistive device: Rolling walker (2 wheeled) Gait Pattern/deviations: Step-through pattern;Decreased stride length;Trunk flexed;Narrow base of support Gait velocity: slow Gait velocity interpretation: Below normal speed for age/gender General Gait Details: Slow and guarded reporting Lt sided lower back pain. Cues for upright posture and education for DME use and placement for proximity. No loss of balance noted but prefers RW for support due to back pain. HR in 90s. Denies dizziness.  Stairs            Wheelchair Mobility    Modified Rankin (Stroke Patients Only)       Balance Overall balance assessment: Needs assistance Sitting-balance support: No upper extremity supported Sitting balance-Leahy Scale: Good     Standing balance support: No upper extremity supported Standing balance-Leahy Scale: Fair                               Pertinent Vitals/Pain Pain Assessment: 0-10 Pain Score: 3  Pain Location: back and chest Pain Descriptors / Indicators: Crushing Pain Intervention(s): Limited activity within patient's tolerance;Monitored during session;Repositioned    Home Living Family/patient expects to be discharged to:: Private residence Living Arrangements: Children Available Help at Discharge: Available PRN/intermittently;Family Type of Home: Apartment Home Access: Level entry     Home Layout: Two level;Able to live on main  level with bedroom/bathroom Home Equipment: Walker - 2 wheels      Prior Function Level of Independence: Independent               Hand Dominance   Dominant Hand: Right     Extremity/Trunk Assessment   Upper Extremity Assessment: Defer to OT evaluation           Lower Extremity Assessment: Generalized weakness (Lt glute med. tenderness, ICE applied)         Communication   Communication: No difficulties  Cognition Arousal/Alertness: Awake/alert Behavior During Therapy: WFL for tasks assessed/performed Overall Cognitive Status: Within Functional Limits for tasks assessed                      General Comments General comments (skin integrity, edema, etc.): Tenderness around gluteus medius origin. Unable to elicit pain with stretch but tender to palpation. Ice applied with instructions for 20 minute intervals. Pt reports ICE is improving symptoms at end of therapy session.    Exercises        Assessment/Plan    PT Assessment All further PT needs can be met in the next venue of care  PT Diagnosis Difficulty walking;Abnormality of gait;Generalized weakness;Acute pain   PT Problem List Decreased strength;Decreased activity tolerance;Decreased balance;Decreased mobility;Pain  PT Treatment Interventions     PT Goals (Current goals can be found in the Care Plan section) Acute Rehab PT Goals Patient Stated Goal: to go home PT Goal Formulation: All assessment and education complete, DC therapy    Frequency     Barriers to discharge        Co-evaluation               End of Session   Activity Tolerance: Patient tolerated treatment well Patient left: in chair;with call bell/phone within reach;with chair alarm set Nurse Communication: Mobility status (Left glute pain)    Functional Assessment Tool Used: clinical observation Functional Limitation: Mobility: Walking and moving around Mobility: Walking and Moving Around Current Status ([id number]): At least 1 percent but less than 20 percent impaired, limited or restricted Mobility: Walking and Moving Around Goal Status [phone]): At least 1 percent but less than 20 percent impaired,  limited or restricted Mobility: Walking and Moving Around Discharge Status [phone]): At least 1 percent but less than 20 percent impaired, limited or restricted    Time: 0939-1003 PT Time Calculation (min) (ACUTE ONLY): 24 min   Charges:   PT Evaluation $Initial PT Evaluation Tier I: 1 Procedure PT Treatments $Gait Training: 8-22 mins   PT G Codes:   PT G-Codes **NOT FOR INPATIENT CLASS** Functional Assessment Tool Used: clinical observation Functional Limitation: Mobility: Walking and moving around Mobility: Walking and Moving Around Current Status ([id number]): At least 1 percent but less than 20 percent impaired, limited or restricted Mobility: Walking and Moving Around Goal Status [phone]): At least 1 percent but less than 20 percent impaired, limited or restricted Mobility: Walking and Moving Around Discharge Status [phone]): At least 1 percent but less than 20 percent impaired, limited or restricted    Ellouise Newer 04/03/2015, 10:47 AM  Elayne Snare, [place]

## 2015-04-03 NOTE — Progress Notes (Signed)
Occupational Therapy Treatment Patient Details Name: [name] [name] MRN: [id number] DOB: 10/29/1955 Today's Date: 04/03/2015    History of present illness 59 year old male with a history of diabetes mellitus, ESRD, CAD, diastolic CHF, hypertension, COPD presented with left-sided weakness affecting his left arm and left leg began in the morning of 03/31/2015 when he woke up. Apparently, the patient was unsteady in normal sterile because of his weakness. The patient also missed his dialysis treatment and his outpatient Center on 03/31/2015.  Currently undergoing stroke work up.   OT comments  This 59 yo male is making progress, but slowly due to increased fatigue with activity. Recommend HHOT and 24 hour S/ prn A post D/C initially.  Follow Up Recommendations  Home health OT;Supervision/Assistance - 24 hour    Equipment Recommendations  3 in 1 bedside comode       Precautions / Restrictions Precautions Precautions: Fall Restrictions Weight Bearing Restrictions: No       Mobility Bed Mobility Overal bed mobility: Modified Independent Bed Mobility: Supine to Sit;Sit to Supine     Supine to sit: Modified independent (Device/Increase time) Sit to supine: Modified independent (Device/Increase time)      Transfers Overall transfer level: Needs assistance Equipment used: Rolling walker (2 wheeled) Transfers: Sit to/from Stand Sit to Stand: Supervision                  ADL Overall ADL's : Needs assistance/impaired                         Toilet Transfer: Supervision/safety;Ambulation;RW         Tub/Shower Transfer Details (indicate cue type and reason): Pt states that he believes that he has a tub seat at home that he can use for showering in his walk in shower so that it increases his safety and and helps with energy conservation          Vision                 Additional Comments: No change from baseline          Cognition   Behavior  During Therapy: [facility] for tasks assessed/performed Overall Cognitive Status: Within Functional Limits for tasks assessed                                    Pertinent Vitals/ Pain       Pain Assessment: Faces Faces Pain Scale: Hurts even more Pain Location: low back and LLE Pain Descriptors / Indicators: Aching;Sharp Pain Intervention(s): Limited activity within patient's tolerance;Monitored during session;Repositioned;RN gave pain meds during session         Frequency Min 2X/week     Progress Toward Goals  OT Goals(current goals can now be found in the care plan section)  Progress towards OT goals: Progressing toward goals     Plan Discharge plan remains appropriate       End of Session Equipment Utilized During Treatment: Rolling walker   Activity Tolerance Patient limited by fatigue   Patient Left in bed;with call bell/phone within reach;with bed alarm set   Nurse Communication          Time: 1202-1223 OT Time Calculation (min): 21 min  Charges: OT General Charges $OT Visit: 1 Procedure OT Treatments $Self Care/Home Management : 8-22 mins  Almon Register [id number] 04/03/2015, 1:12 PM

## 2015-04-03 NOTE — Progress Notes (Signed)
Subjective:  Had HD yest- removed 1500 to get back on schedule- see plans for discharge today  Objective Vital signs in last 24 hours: Filed Vitals:   04/02/15 2107 04/03/15 0128 04/03/15 0602 04/03/15 0916  BP: 135/77 156/80 126/74 174/68  Pulse: 93 88 87 74  Temp: 97.8 F (36.6 C) 97.9 F (36.6 C) 97.8 F (36.6 C) 98 F (36.7 C)  TempSrc: Oral Oral Oral Oral  Resp: 18 18 189 18  Height:      Weight:      SpO2: 96% 95% 96% 99%   Weight change: -3 kg (-6 lb 9.8 oz)  Intake/Output Summary (Last 24 hours) at 04/03/15 1003 Last data filed at 04/02/15 1546  Gross per 24 hour  Intake      0 ml  Output   1502 ml  Net  -1502 ml   Dialysis Orders: AF TTS 4 hr 180 500/800 EDW 88 2 K 2.25 Ca heparin 8000 AVF Hectorol 4 venofer 50 per week /last given 6/16 Mircera 100 q 2 weeks last given 6/14 Recent labs: Hgb 10.5 6/16 19% sat 5/26 and ferritin 1588 5/19 Ca 10 P 9.6 iPTH 526 5/26  Assessment/Plan: 1. 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 ESRD - TTS - missed HD Thursday- had HD Friday/Sat  to get back on schedule and can remove more volume-  3. Hypertension/volume - mild interstitial edema on CXR -generally gets to edw with post HD BP 140 - 150s standing - may need slightly lower EDW - UF goals 2-4 kg on average - some of BP issue may be due to excess volume- needs updated BP med list at HD after discharge -  4. Anemia -Hgb 10.0 - due for redosing of ESA 6/28 next week - will order then if still here- hold Fe due to ^ ferritin 5. Metabolic bone disease - noncompliant with binders as outpt as evidenced by last P of 10, continue Hectorol- he reports he is "supposed to take 2 phoslo ac, 3 renvela ac and 90 sensipar per day" -needs updated med list at d/c 6. Nutrition - renal carb mod diet/vitamin 7. CAD - on asa, plavix, ranexa and BB 8. DM - per  primary 9. GERD - on PPI and carafate - should not be on carafate > 1 month due to ^ Al content 10.     [name] [name]    Labs: Basic Metabolic Panel:  Recent Labs Lab 03/31/15 2238 04/01/15 0630 04/03/15 0430  NA 140 137 135  K 4.5 3.9 4.6  CL 97* 97* 97*  CO2 27 23 26   GLUCOSE 106* 102* 113*  BUN 52* 53* 30*  CREATININE 11.42* 11.85* 6.66*  CALCIUM 10.0 9.4 8.8*   Liver Function Tests:  Recent Labs Lab 03/31/15 2238 04/01/15 0630 04/03/15 0430  AST 12* 8* 9*  ALT 12* 11* 10*  ALKPHOS 78 60 68  BILITOT 0.7 0.8 0.5  PROT 7.4 6.9 7.1  ALBUMIN 3.7 3.3* 3.3*    Recent Labs Lab 04/03/15 0430  LIPASE 26   No results for input(s): AMMONIA in the last 168 hours. CBC:  Recent Labs Lab 03/31/15 2238 04/01/15 0630 04/03/15 0430  WBC 9.1 6.1 5.5  NEUTROABS 6.9  --   --   HGB 10.9* 10.0* 10.5*  HCT 34.7* 31.2* 33.4*  MCV 82.0 81.3 81.9  PLT 189 154 127*  Cardiac Enzymes:  Recent Labs Lab 03/31/15 2316 04/01/15 0630 04/02/15 1145 04/02/15 1625  CKTOTAL 72  --   --   --   TROPONINI  --  0.05* 0.03 0.03   CBG:  Recent Labs Lab 04/02/15 0632 04/02/15 1121 04/02/15 1643 04/02/15 2109 04/03/15 0628  GLUCAP 110* 123* 157* 113* 101*    Iron Studies: No results for input(s): IRON, TIBC, TRANSFERRIN, FERRITIN in the last 72 hours. Studies/Results: No results found. Medications: Infusions:    Scheduled Medications: . amitriptyline  100 mg Oral QHS  . amLODipine  10 mg Oral Daily  . aspirin  300 mg Rectal Daily   Or  . aspirin  325 mg Oral Daily  . atorvastatin  20 mg Oral Daily  . calcium acetate  1,334 mg Oral TID WC  . carvedilol  50 mg Oral BID WC  . cinacalcet  90 mg Oral Q breakfast  . clopidogrel  75 mg Oral Daily  . doxercalciferol  4 mcg Intravenous Once  . feeding supplement (NEPRO CARB STEADY)  237 mL Oral BID BM  . finasteride  5 mg Oral Daily  . heparin  5,000 Units Subcutaneous 3 times per day  . insulin aspart   0-9 Units Subcutaneous TID WC  . multivitamin  1 tablet Oral QHS  . pantoprazole  40 mg Oral Daily  . pregabalin  50 mg Oral Daily  . ranolazine  500 mg Oral BID  . sevelamer carbonate  2,400 mg Oral TID WC  . sucralfate  1 g Oral TID WC & HS  . venlafaxine  37.5 mg Oral BID WC    have reviewed scheduled and prn medications.  Physical Exam: General: NAD Heart: RRR Lungs: mostly clear Abdomen: soft, non tender Extremities: no peripheral edema Dialysis Access: AVF - L   04/03/2015,10:03 AM

## 2015-04-03 NOTE — Discharge Summary (Signed)
Physician Discharge Summary  Quinston Janow [id number] DOB: May 03, 1956 DOA: 03/31/2015  PCP: [name]  Admit date: 03/31/2015 Discharge date: 04/03/2015  Recommendations for Outpatient Follow-up:  1. Pt will need to follow up with PCP in 2 weeks post discharge  Discharge Diagnoses:  TIA -appreciate neurology -MRI brain No acute intracranial process, specifically no acute ischemia.  -MRA brain No acute large vessel occlusion or high-grade stenosis. -Carotid duplex negative for hemodynamically significant stenosis -12/06/2014 echocardiogram shows EF [id number], grade 1 diastolic dysfunction, no WMA -12/13/2014 hemoglobin A1c 5.5 -LDL 48  -continue aspirin 325 mg and plavix per neurology recommendation Hypertensive urgency  -Patient had blood pressure 192/104 in the emergency department  -Continue amlodipine, carvedilol-->controlled ESRD  -Patient received dialysis 04/01/2015 and 03/02/2015 -Appreciate nephrology  GERD  -Continue Carafate and PPI  Coronary artery disease  -History of PCI 09 Oct 2010  -February 2016 cardiac catheterization negative for obstructive lesions  -Continue aspirin, Plavix, Ranexa -The patient continued to have intermittent chest discomfort, but cardiac enzymes again were negative -Recent VQ scan in March 2016 was negative and the patient had cardiac catheterization February 2016 without any obstructive lesions.  -Patient remained hemodynamically stable without tachycardia, hypoxemia, hypotension  Hyperlipidemia  -Continue Lipitor 20 mg daily  -LDL 48  Diabetes mellitus type 2  -12/13/2014 hemoglobin A1c 5.5  -resume lantus 10 units after d/c as vomiting is improved and pt eating 100% meals -CBGs controlled Vomiting -?previously improved with carafate -?related to missing HD -?related to taking too many pills at one time-- patient seems to have vomiting after taking many pills -continue carafate and protonix -6/25, 6/26--pt  eating 100% of meals without vomiting or abdominal pain -check LFTs and lipase--WNL - 12/13/2014 gastric emptying study within normal limits - 02/23/2015 RUQ [place]-- negative for cholecystitis or cholelithiasis  Discharge Condition: stable  Disposition: home Follow-up Information    Follow up with Antony Contras, MD.   Specialties:  Neurology, Radiology   Contact information:   [address] [place] [address] [phone]       Follow up In 2 months.      Diet:renal with 1200cc restriction Wt Readings from Last 3 Encounters:  04/02/15 87.9 kg (193 lb 12.6 oz)  02/22/15 86.637 kg (191 lb)  12/14/14 91.309 kg (201 lb 4.8 oz)    History of present illness:  59 year old male with a history of diabetes mellitus, ESRD, CAD, diastolic CHF, hypertension, COPD presented with left-sided weakness affecting his left arm and left leg began in the morning of 03/31/2015 when he woke up. Apparently, the patient was unsteady getting up because of his weakness. The patient also missed his dialysis treatment and his outpatient Center on 03/31/2015. He denied any fevers, chills, chest pain, shortness breath, nausea, vomiting. Neurology was consulted and recommended full stroke Workup. MRI of the brain was negative. Neurology felt the patient had a TIA. The patient's antiplatelets therapy was changed to aspirin 325 mg with aspirin 75 mg daily per neurology recommendations. The patient had intermittent emesis. Workup including LFTs and right upper quadrant ultrasound were unremarkable. It was determined that this may have been related to dysuria volume of pills that the patient has to take in addition to missing dialysis recently. The patient has been able to eat 100% of his meals without any vomiting.  Consultants: Neurology nephrology  Discharge Exam: Filed Vitals:   04/03/15 0602  BP: 126/74  Pulse: 87  Temp: 97.8 F (36.6 C)  Resp: 189   Filed Vitals:  04/02/15 1820 04/02/15  2107 04/03/15 0128 04/03/15 0602  BP: 173/95 135/77 156/80 126/74  Pulse: 98 93 88 87  Temp: 97.8 F (36.6 C) 97.8 F (36.6 C) 97.9 F (36.6 C) 97.8 F (36.6 C)  TempSrc: Oral Oral Oral Oral  Resp: 20 18 18  189  Height:      Weight:      SpO2: 97% 96% 95% 96%   General: A&O x 3, NAD, pleasant, cooperative Cardiovascular: RRR, no rub, no gallop, no S3 Respiratory: CTAB, no wheeze, no rhonchi Abdomen:soft, nontender, nondistended, positive bowel sounds Extremities: No edema, No lymphangitis, no petechiae  Discharge Instructions      Discharge Instructions    Diet - low sodium heart healthy    Complete by:  As directed      Increase activity slowly    Complete by:  As directed             Medication List    STOP taking these medications        aspirin EC 81 MG tablet  Replaced by:  aspirin 325 MG tablet      TAKE these medications        acetaminophen 500 MG tablet  Commonly known as:  TYLENOL  Take 500 mg by mouth every 6 (six) hours as needed for headache.     amitriptyline 100 MG tablet  Commonly known as:  ELAVIL  Take 1 tablet (100 mg total) by mouth at bedtime.     amLODipine 10 MG tablet  Commonly known as:  NORVASC  Take 10 mg by mouth daily.     aspirin 325 MG tablet  Take 1 tablet (325 mg total) by mouth daily.     atorvastatin 20 MG tablet  Commonly known as:  LIPITOR  Take 20 mg by mouth daily.     calcium acetate 667 MG capsule  Commonly known as:  PHOSLO  Take 2 capsules (1,334 mg total) by mouth 3 (three) times daily with meals.     carvedilol 25 MG tablet  Commonly known as:  COREG  Take 50 mg by mouth 2 (two) times daily.     cinacalcet 90 MG tablet  Commonly known as:  SENSIPAR  Take 90 mg by mouth daily.     clopidogrel 75 MG tablet  Commonly known as:  PLAVIX  Take 1 tablet (75 mg total) by mouth daily.     COMBIVENT RESPIMAT 20-100 MCG/ACT Aers respimat  Generic drug:  Ipratropium-Albuterol  Inhale 2 puffs into the  lungs every 6 (six) hours as needed for wheezing.     Darbepoetin Alfa 25 MCG/0.42ML Sosy injection  Commonly known as:  ARANESP  Inject 0.42 mLs (25 mcg total) into the vein every Tuesday with hemodialysis.     doxercalciferol 4 MCG/2ML injection  Commonly known as:  HECTOROL  Inject 1 mL (2 mcg total) into the vein Every Tuesday,Thursday,and Saturday with dialysis.     ferric gluconate 62.5 mg in sodium chloride 0.9 % 100 mL  Inject 62.5 mg into the vein every Thursday with hemodialysis.     finasteride 5 MG tablet  Commonly known as:  PROSCAR  Take 5 mg by mouth daily.     insulin aspart 100 UNIT/ML injection  Commonly known as:  novoLOG  Inject 15-20 Units into the skin 3 (three) times daily as needed for high blood sugar (CBG >150).     insulin glargine 100 UNIT/ML injection  Commonly known as:  LANTUS  Inject  10 Units into the skin at bedtime.     meclizine 25 MG tablet  Commonly known as:  ANTIVERT  Take 25 mg by mouth 3 (three) times daily as needed for dizziness.     multivitamin Tabs tablet  Take 1 tablet by mouth at bedtime.     ondansetron 4 MG tablet  Commonly known as:  ZOFRAN  Take 1 tablet (4 mg total) by mouth every 6 (six) hours.     pantoprazole 40 MG tablet  Commonly known as:  PROTONIX  Take 1 tablet (40 mg total) by mouth daily.     pregabalin 50 MG capsule  Commonly known as:  LYRICA  Take 1 capsule (50 mg total) by mouth daily.     ranolazine 500 MG 12 hr tablet  Commonly known as:  RANEXA  Take 1 tablet (500 mg total) by mouth 2 (two) times daily.     sevelamer carbonate 800 MG tablet  Commonly known as:  RENVELA  Take 1,600-2,400 mg by mouth 3 (three) times daily with meals. Pt takes 3 capsules with breakfast, 2 capsules with lunch, and 3 capsules with dinner.     sucralfate 1 GM/10ML suspension  Commonly known as:  CARAFATE  Take 10 mLs (1 g total) by mouth 4 (four) times daily -  with meals and at bedtime.     venlafaxine 37.5 MG  tablet  Commonly known as:  EFFEXOR  Take 37.5 mg by mouth 2 (two) times daily.         The results of significant diagnostics from this hospitalization (including imaging, microbiology, ancillary and laboratory) are listed below for reference.    Significant Diagnostic Studies: Dg Chest 2 View  04/01/2015   CLINICAL DATA:  Patient with left-sided weakness.  EXAM: CHEST  2 VIEW  COMPARISON:  Chest radiograph 12/2014  FINDINGS: Stable cardiomegaly. Bilateral predominately perihilar interstitial pulmonary opacities. No definite pleural effusion or pneumothorax. Mid thoracic spine degenerative changes.  IMPRESSION: Cardiomegaly with mild interstitial edema.   Electronically Signed   By: Lovey Newcomer M.D.   On: 04/01/2015 00:58   Ct Head Wo Contrast  04/01/2015   CLINICAL DATA:  Patient with left arm and leg weakness.  EXAM: CT HEAD WITHOUT CONTRAST  TECHNIQUE: Contiguous axial images were obtained from the base of the skull through the vertex without intravenous contrast.  COMPARISON:  None.  FINDINGS: Ventricles and sulci are appropriate for patient's age. Periventricular and subcortical white matter hypodensities compatible with chronic small vessel ischemic changes. No evidence for acute cortically based infarct, intracranial hemorrhage, mass lesion or mass-effect. Calvarium intact. Paranasal sinuses unremarkable. Mastoid air cells unremarkable.  IMPRESSION: No acute intracranial process.  Chronic small vessel ischemic changes.   Electronically Signed   By: Lovey Newcomer M.D.   On: 04/01/2015 01:11   Mr Brain Wo Contrast  04/01/2015   CLINICAL DATA:  Acute onset LEFT-sided weakness 20 hours ago, unable to go to dialysis today. Weakness. History of hypertension, end-stage renal disease, diabetes.  EXAM: MRI HEAD WITHOUT CONTRAST  MRA HEAD WITHOUT CONTRAST  TECHNIQUE: Multiplanar, multiecho pulse sequences of the brain and surrounding structures were obtained without intravenous contrast. Angiographic  images of the head were obtained using MRA technique without contrast.  COMPARISON:  CT head April 01, 2015 0016 hours  FINDINGS: MRI HEAD FINDINGS  No reduced diffusion to suggest acute ischemia. No susceptibility artifact to suggest hemorrhage. Confluent T2 hyperintense signal within the pons. Patchy to confluent supratentorial white matter T2 hyperintense  signal. 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 No midline shift or mass effect.  No abnormal extra-axial fluid collections. Ocular globes and orbital contents are unremarkable. Paranasal sinuses and mastoid air cells are well aerated. No abnormal sellar expansion. No cerebellar tonsillar ectopia. No suspicious calvarial bone marrow signal. Patient is edentulous.  MRA HEAD FINDINGS  Anterior circulation: Normal flow related enhancement of the included cervical, petrous, cavernous and supra clinoid internal carotid arteries. Patent anterior communicating artery. Normal flow related enhancement of the anterior and middle cerebral arteries, including more distal segments.  No large vessel occlusion, high-grade stenosis, abnormal luminal irregularity, aneurysm.  Posterior circulation: Codominant vertebral arteries. Basilar artery is patent, with normal flow related enhancement of the main branch vessels. Small RIGHT posterior communicating arteries present. Normal flow related enhancement of the posterior cerebral arteries. Mild stenosis LEFT P3 segment.  No large vessel occlusion, high-grade stenosis, abnormal luminal irregularity, aneurysm.  IMPRESSION: MRI HEAD: No acute intracranial process, specifically no acute ischemia.  Moderate to severe white matter changes compatible with chronic small vessel ischemic disease. 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  MRA HEAD: No acute large vessel occlusion or high-grade stenosis. Mild stenosis proximal LEFT P3 segment.   Electronically Signed   By: Elon Alas M.D.   On: 04/01/2015 04:15   [name] Head/brain Wo  Cm  04/01/2015   CLINICAL DATA:  Acute onset LEFT-sided weakness 20 hours ago, unable to go to dialysis today. Weakness. History of hypertension, end-stage renal disease, diabetes.  EXAM: MRI HEAD WITHOUT CONTRAST  MRA HEAD WITHOUT CONTRAST  TECHNIQUE: Multiplanar, multiecho pulse sequences of the brain and surrounding structures were obtained without intravenous contrast. Angiographic images of the head were obtained using MRA technique without contrast.  COMPARISON:  CT head April 01, 2015 0016 hours  FINDINGS: MRI HEAD FINDINGS  No reduced diffusion to suggest acute ischemia. No susceptibility artifact to suggest hemorrhage. Confluent T2 hyperintense signal within the pons. Patchy to confluent supratentorial white matter T2 hyperintense signal. 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 No midline shift or mass effect.  No abnormal extra-axial fluid collections. Ocular globes and orbital contents are unremarkable. Paranasal sinuses and mastoid air cells are well aerated. No abnormal sellar expansion. No cerebellar tonsillar ectopia. No suspicious calvarial bone marrow signal. Patient is edentulous.  MRA HEAD FINDINGS  Anterior circulation: Normal flow related enhancement of the included cervical, petrous, cavernous and supra clinoid internal carotid arteries. Patent anterior communicating artery. Normal flow related enhancement of the anterior and middle cerebral arteries, including more distal segments.  No large vessel occlusion, high-grade stenosis, abnormal luminal irregularity, aneurysm.  Posterior circulation: Codominant vertebral arteries. Basilar artery is patent, with normal flow related enhancement of the main branch vessels. Small RIGHT posterior communicating arteries present. Normal flow related enhancement of the posterior cerebral arteries. Mild stenosis LEFT P3 segment.  No large vessel occlusion, high-grade stenosis, abnormal luminal irregularity, aneurysm.  IMPRESSION: MRI HEAD: No acute intracranial  process, specifically no acute ischemia.  Moderate to severe white matter changes compatible with chronic small vessel ischemic disease. 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  MRA HEAD: No acute large vessel occlusion or high-grade stenosis. Mild stenosis proximal LEFT P3 segment.   Electronically Signed   By: Elon Alas M.D.   On: 04/01/2015 04:15     Microbiology: No results found for this or any previous visit (from the past 240 hour(s)).   Labs: Basic Metabolic Panel:  Recent Labs Lab 03/31/15 2238 04/01/15 0630 04/03/15 0430  NA 140 137 135  K 4.5 3.9 4.6  CL 97* 97* 97*  CO2 27 23 26   GLUCOSE 106* 102* 113*  BUN 52* 53* 30*  CREATININE 11.42* 11.85* 6.66*  CALCIUM 10.0 9.4 8.8*   Liver Function Tests:  Recent Labs Lab 03/31/15 2238 04/01/15 0630 04/03/15 0430  AST 12* 8* 9*  ALT 12* 11* 10*  ALKPHOS 78 60 68  BILITOT 0.7 0.8 0.5  PROT 7.4 6.9 7.1  ALBUMIN 3.7 3.3* 3.3*    Recent Labs Lab 04/03/15 0430  LIPASE 26   No results for input(s): AMMONIA in the last 168 hours. CBC:  Recent Labs Lab 03/31/15 2238 04/01/15 0630 04/03/15 0430  WBC 9.1 6.1 5.5  NEUTROABS 6.9  --   --   HGB 10.9* 10.0* 10.5*  HCT 34.7* 31.2* 33.4*  MCV 82.0 81.3 81.9  PLT 189 154 127*   Cardiac Enzymes:  Recent Labs Lab 03/31/15 2316 04/01/15 0630 04/02/15 1145 04/02/15 1625  CKTOTAL 72  --   --   --   TROPONINI  --  0.05* 0.03 0.03   BNP: Invalid input(s): POCBNP CBG:  Recent Labs Lab 04/02/15 0632 04/02/15 1121 04/02/15 1643 04/02/15 2109 04/03/15 0628  GLUCAP 110* 123* 157* 113* 101*    Time coordinating discharge:  Greater than 30 minutes  Signed:  Spero Gunnels, DO [facility] Pager: [phone] 04/03/2015, 7:58 AM

## 2015-04-03 NOTE — Evaluation (Signed)
SLP Cancellation Note  Patient Details Name: [name] [name] MRN: [id number] DOB: September 06, 1956   Cancelled treatment:       Reason Eval/Treat Not Completed: Other (comment) (RN SSS passed by pt and RN reports good tolerance of po)   Luanna Salk, [facility] SLP [phone]

## 2015-04-03 NOTE — Care Management Note (Addendum)
Case Management Note  Patient Details  Name: [name] [name] MRN: [id number] Date of Birth: November 15, 1955  Subjective/Objective:                   TIA Action/Plan:   Expected Discharge Date:  04/03/15               Expected Discharge Plan:  Home/Self Care  In-House Referral:     Discharge planning Services  CM Consult  Post Acute Care Choice:  NA Choice offered to:  NA  DME Arranged:    DME Agency:     HH Arranged:    [place] Agency:     Status of Service:  Completed, signed off  Medicare Important Message Given:    Date Medicare IM Given:    Medicare IM give by:    Date Additional Medicare IM Given:    Additional Medicare Important Message give by:     If discussed at [place] of Stay Meetings, dates discussed:    Additional Comments: 12:25 CM spoke with MD and arranged a PT/OT EVAL and TREAT with [facility].  The clinic will call the pt with an appt. This message is on the pt's AVS.  No other CM needs were communicated.  CM spoke with pt who states he does not need any DME bc he has his late mother's. CM asked if pt has a PCP and he states no and verbalizes understanding to call [facility] number on AVS to secure a PCP.  Pt also politely refuses HHPT/OT and requests outpt PT/OT.  CM has texted MD to see if he wants me to arrange oupt PT/OT.  Will monitor for response. Dellie Catholic, RN 04/03/2015, 10:07 AM

## 2015-05-03 ENCOUNTER — Other Ambulatory Visit: Payer: Self-pay | Admitting: *Deleted

## 2015-05-03 DIAGNOSIS — T82510A Breakdown (mechanical) of surgically created arteriovenous fistula, initial encounter: Secondary | ICD-10-CM

## 2015-05-16 ENCOUNTER — Observation Stay (HOSPITAL_COMMUNITY): Payer: Medicare Other

## 2015-05-16 ENCOUNTER — Observation Stay (HOSPITAL_COMMUNITY)
Admission: EM | Admit: 2015-05-16 | Discharge: 2015-05-20 | Disposition: A | Discharge status: Home / Self Care | Payer: Medicare Other | Attending: Family Medicine | Admitting: Family Medicine

## 2015-05-16 ENCOUNTER — Encounter (HOSPITAL_COMMUNITY): Payer: Self-pay | Admitting: Neurology

## 2015-05-16 ENCOUNTER — Emergency Department (HOSPITAL_COMMUNITY): Payer: Medicare Other

## 2015-05-16 DIAGNOSIS — I160 Hypertensive urgency: Secondary | ICD-10-CM | POA: Diagnosis present

## 2015-05-16 DIAGNOSIS — R079 Chest pain, unspecified: Secondary | ICD-10-CM | POA: Diagnosis not present

## 2015-05-16 DIAGNOSIS — I2510 Atherosclerotic heart disease of native coronary artery without angina pectoris: Secondary | ICD-10-CM | POA: Diagnosis not present

## 2015-05-16 DIAGNOSIS — N186 End stage renal disease: Secondary | ICD-10-CM | POA: Insufficient documentation

## 2015-05-16 DIAGNOSIS — I2089 Other forms of angina pectoris: Secondary | ICD-10-CM | POA: Insufficient documentation

## 2015-05-16 DIAGNOSIS — Z79899 Other long term (current) drug therapy: Secondary | ICD-10-CM | POA: Diagnosis not present

## 2015-05-16 DIAGNOSIS — D649 Anemia, unspecified: Secondary | ICD-10-CM | POA: Diagnosis not present

## 2015-05-16 DIAGNOSIS — E440 Moderate protein-calorie malnutrition: Secondary | ICD-10-CM | POA: Diagnosis present

## 2015-05-16 DIAGNOSIS — I208 Other forms of angina pectoris: Secondary | ICD-10-CM | POA: Insufficient documentation

## 2015-05-16 DIAGNOSIS — Z7902 Long term (current) use of antithrombotics/antiplatelets: Secondary | ICD-10-CM | POA: Insufficient documentation

## 2015-05-16 DIAGNOSIS — Z7982 Long term (current) use of aspirin: Secondary | ICD-10-CM | POA: Diagnosis not present

## 2015-05-16 DIAGNOSIS — F329 Major depressive disorder, single episode, unspecified: Secondary | ICD-10-CM | POA: Diagnosis not present

## 2015-05-16 DIAGNOSIS — Z87891 Personal history of nicotine dependence: Secondary | ICD-10-CM | POA: Insufficient documentation

## 2015-05-16 DIAGNOSIS — I5189 Other ill-defined heart diseases: Secondary | ICD-10-CM | POA: Diagnosis present

## 2015-05-16 DIAGNOSIS — Z8639 Personal history of other endocrine, nutritional and metabolic disease: Secondary | ICD-10-CM

## 2015-05-16 DIAGNOSIS — R0782 Intercostal pain: Secondary | ICD-10-CM

## 2015-05-16 DIAGNOSIS — I120 Hypertensive chronic kidney disease with stage 5 chronic kidney disease or end stage renal disease: Secondary | ICD-10-CM | POA: Insufficient documentation

## 2015-05-16 DIAGNOSIS — I5032 Chronic diastolic (congestive) heart failure: Secondary | ICD-10-CM | POA: Diagnosis not present

## 2015-05-16 DIAGNOSIS — R634 Abnormal weight loss: Secondary | ICD-10-CM

## 2015-05-16 DIAGNOSIS — I10 Essential (primary) hypertension: Secondary | ICD-10-CM | POA: Diagnosis not present

## 2015-05-16 DIAGNOSIS — R0789 Other chest pain: Secondary | ICD-10-CM | POA: Diagnosis present

## 2015-05-16 DIAGNOSIS — Z992 Dependence on renal dialysis: Secondary | ICD-10-CM | POA: Diagnosis not present

## 2015-05-16 DIAGNOSIS — Z9889 Other specified postprocedural states: Secondary | ICD-10-CM | POA: Diagnosis not present

## 2015-05-16 DIAGNOSIS — D696 Thrombocytopenia, unspecified: Secondary | ICD-10-CM | POA: Diagnosis present

## 2015-05-16 DIAGNOSIS — Z794 Long term (current) use of insulin: Secondary | ICD-10-CM | POA: Diagnosis not present

## 2015-05-16 DIAGNOSIS — Z9861 Coronary angioplasty status: Secondary | ICD-10-CM

## 2015-05-16 DIAGNOSIS — K635 Polyp of colon: Secondary | ICD-10-CM | POA: Diagnosis not present

## 2015-05-16 DIAGNOSIS — G459 Transient cerebral ischemic attack, unspecified: Secondary | ICD-10-CM | POA: Diagnosis present

## 2015-05-16 DIAGNOSIS — K921 Melena: Secondary | ICD-10-CM | POA: Diagnosis not present

## 2015-05-16 HISTORY — DX: Abnormal weight loss: R63.4

## 2015-05-16 HISTORY — DX: Thrombocytopenia, unspecified: D69.6

## 2015-05-16 LAB — MRSA PCR SCREENING: MRSA by PCR: NEGATIVE

## 2015-05-16 LAB — I-STAT TROPONIN, ED
TROPONIN I, POC: 0.03 ng/mL (ref 0.00–0.08)
TROPONIN I, POC: 0.05 ng/mL (ref 0.00–0.08)

## 2015-05-16 LAB — COMPREHENSIVE METABOLIC PANEL
ALBUMIN: 4 g/dL (ref 3.5–5.0)
ALT: 10 U/L — ABNORMAL LOW (ref 17–63)
ANION GAP: 18 — AB (ref 5–15)
AST: 13 U/L — ABNORMAL LOW (ref 15–41)
Alkaline Phosphatase: 62 U/L (ref 38–126)
BILIRUBIN TOTAL: 0.7 mg/dL (ref 0.3–1.2)
BUN: 40 mg/dL — ABNORMAL HIGH (ref 6–20)
CO2: 21 mmol/L — AB (ref 22–32)
Calcium: 9.7 mg/dL (ref 8.9–10.3)
Chloride: 99 mmol/L — ABNORMAL LOW (ref 101–111)
Creatinine, Ser: 11.09 mg/dL — ABNORMAL HIGH (ref 0.61–1.24)
GFR calc Af Amer: 5 mL/min — ABNORMAL LOW (ref >60)
GFR, EST NON AFRICAN AMERICAN: 4 mL/min — AB (ref >60)
Glucose, Bld: 86 mg/dL (ref 65–99)
Potassium: 4.4 mmol/L (ref 3.5–5.1)
Sodium: 138 mmol/L (ref 135–145)
TOTAL PROTEIN: 7.6 g/dL (ref 6.5–8.1)

## 2015-05-16 LAB — CBC
HEMATOCRIT: 42.5 % (ref 39.0–52.0)
HEMOGLOBIN: 13.6 g/dL (ref 13.0–17.0)
MCH: 26.1 pg (ref 26.0–34.0)
MCHC: 32 g/dL (ref 30.0–36.0)
MCV: 81.4 fL (ref 78.0–100.0)
PLATELETS: 100 10*3/uL — AB (ref 150–400)
RBC: 5.22 MIL/uL (ref 4.22–5.81)
RDW: 18.5 % — AB (ref 11.5–15.5)
WBC: 6.8 10*3/uL (ref 4.0–10.5)

## 2015-05-16 LAB — SAVE SMEAR

## 2015-05-16 LAB — PROTIME-INR
INR: 1.14 (ref 0.00–1.49)
Prothrombin Time: 14.8 seconds (ref 11.6–15.2)

## 2015-05-16 LAB — TROPONIN I
TROPONIN I: 0.03 ng/mL (ref <0.031)
TROPONIN I: 0.03 ng/mL (ref <0.031)

## 2015-05-16 LAB — GLUCOSE, CAPILLARY
GLUCOSE-CAPILLARY: 75 mg/dL (ref 65–99)
GLUCOSE-CAPILLARY: 102 mg/dL — AB (ref 65–99)
GLUCOSE-CAPILLARY: 151 mg/dL — AB (ref 65–99)

## 2015-05-16 LAB — APTT: APTT: 37 s (ref 24–37)

## 2015-05-16 MED ORDER — HYDRALAZINE HCL 20 MG/ML IJ SOLN: 10.0000 mg | Freq: Four times a day (QID) | INTRAMUSCULAR | Status: DC | PRN

## 2015-05-16 MED ORDER — GI COCKTAIL ~~LOC~~
30.0000 mL | Freq: Two times a day (BID) | ORAL | Status: DC | PRN
  Administered 2015-05-16: 30 mL via ORAL
  Filled 2015-05-16: qty 30

## 2015-05-16 MED ORDER — RENA-VITE PO TABS
1.0000 | ORAL_TABLET | Freq: Every day | ORAL | Status: DC
  Administered 2015-05-16 – 2015-05-17 (×2): 1 via ORAL
  Filled 2015-05-16 (×2): qty 1

## 2015-05-16 MED ORDER — ACETAMINOPHEN 325 MG PO TABS
650.0000 mg | ORAL_TABLET | ORAL | Status: DC | PRN
  Administered 2015-05-16: 650 mg via ORAL
  Filled 2015-05-16: qty 2

## 2015-05-16 MED ORDER — IPRATROPIUM-ALBUTEROL 0.5-2.5 (3) MG/3ML IN SOLN: 3.0000 mL | Freq: Four times a day (QID) | RESPIRATORY_TRACT | Status: DC | PRN

## 2015-05-16 MED ORDER — DOXERCALCIFEROL 4 MCG/2ML IV SOLN
2.0000 ug | INTRAVENOUS | Status: DC
  Administered 2015-05-17 – 2015-05-19 (×2): 2 ug via INTRAVENOUS
  Filled 2015-05-16 (×2): qty 2

## 2015-05-16 MED ORDER — NITROGLYCERIN 0.4 MG SL SUBL
0.4000 mg | SUBLINGUAL_TABLET | SUBLINGUAL | Status: AC | PRN
  Administered 2015-05-16 (×3): 0.4 mg via SUBLINGUAL

## 2015-05-16 MED ORDER — PANTOPRAZOLE SODIUM 40 MG PO TBEC
40.0000 mg | DELAYED_RELEASE_TABLET | Freq: Every day | ORAL | Status: DC
  Administered 2015-05-16 – 2015-05-20 (×4): 40 mg via ORAL
  Filled 2015-05-16 (×5): qty 1

## 2015-05-16 MED ORDER — AMITRIPTYLINE HCL 50 MG PO TABS
100.0000 mg | ORAL_TABLET | Freq: Every day | ORAL | Status: DC
  Administered 2015-05-16 – 2015-05-20 (×4): 100 mg via ORAL
  Filled 2015-05-16 (×4): qty 2

## 2015-05-16 MED ORDER — NITROGLYCERIN 2 % TD OINT
1.0000 [in_us] | TOPICAL_OINTMENT | Freq: Four times a day (QID) | TRANSDERMAL | Status: DC
  Administered 2015-05-16 – 2015-05-20 (×15): 1 [in_us] via TOPICAL
  Filled 2015-05-16 (×21): qty 30
  Filled 2015-05-16: qty 1
  Filled 2015-05-16 (×8): qty 30

## 2015-05-16 MED ORDER — FINASTERIDE 5 MG PO TABS
5.0000 mg | ORAL_TABLET | Freq: Every day | ORAL | Status: DC
  Administered 2015-05-16 – 2015-05-20 (×5): 5 mg via ORAL
  Filled 2015-05-16 (×5): qty 1

## 2015-05-16 MED ORDER — ASPIRIN 325 MG PO TABS
325.0000 mg | ORAL_TABLET | Freq: Every day | ORAL | Status: DC
  Administered 2015-05-17 – 2015-05-20 (×4): 325 mg via ORAL
  Filled 2015-05-16 (×4): qty 1

## 2015-05-16 MED ORDER — ATORVASTATIN CALCIUM 20 MG PO TABS
20.0000 mg | ORAL_TABLET | Freq: Every day | ORAL | Status: DC
  Administered 2015-05-16 – 2015-05-20 (×4): 20 mg via ORAL
  Filled 2015-05-16 (×4): qty 1

## 2015-05-16 MED ORDER — CINACALCET HCL 30 MG PO TABS
90.0000 mg | ORAL_TABLET | Freq: Every day | ORAL | Status: DC
  Administered 2015-05-16 – 2015-05-18 (×3): 90 mg via ORAL
  Filled 2015-05-16 (×3): qty 3

## 2015-05-16 MED ORDER — SODIUM CHLORIDE 0.9 % IV SOLN: 62.5000 mg | INTRAVENOUS | Status: DC

## 2015-05-16 MED ORDER — GI COCKTAIL ~~LOC~~: 30.0000 mL | Freq: Four times a day (QID) | ORAL | Status: DC | PRN

## 2015-05-16 MED ORDER — MORPHINE SULFATE 4 MG/ML IJ SOLN
4.0000 mg | Freq: Once | INTRAMUSCULAR | Status: AC
  Administered 2015-05-16: 4 mg via INTRAVENOUS
  Filled 2015-05-16: qty 1

## 2015-05-16 MED ORDER — CLOPIDOGREL BISULFATE 75 MG PO TABS
75.0000 mg | ORAL_TABLET | Freq: Every day | ORAL | Status: DC
  Administered 2015-05-16 – 2015-05-20 (×5): 75 mg via ORAL
  Filled 2015-05-16 (×5): qty 1

## 2015-05-16 MED ORDER — ONDANSETRON HCL 4 MG/2ML IJ SOLN
4.0000 mg | Freq: Four times a day (QID) | INTRAMUSCULAR | Status: DC | PRN
  Administered 2015-05-19 (×2): 4 mg via INTRAVENOUS
  Filled 2015-05-16 (×2): qty 2

## 2015-05-16 MED ORDER — CARVEDILOL 25 MG PO TABS
50.0000 mg | ORAL_TABLET | Freq: Two times a day (BID) | ORAL | Status: DC
  Administered 2015-05-16 – 2015-05-20 (×6): 50 mg via ORAL
  Filled 2015-05-16 (×6): qty 2

## 2015-05-16 MED ORDER — RANOLAZINE ER 500 MG PO TB12
500.0000 mg | ORAL_TABLET | Freq: Two times a day (BID) | ORAL | Status: DC
  Administered 2015-05-16 – 2015-05-20 (×8): 500 mg via ORAL
  Filled 2015-05-16 (×8): qty 1

## 2015-05-16 MED ORDER — CINACALCET HCL 30 MG PO TABS: 90.0000 mg | ORAL_TABLET | Freq: Every day | ORAL | Status: DC

## 2015-05-16 MED ORDER — SODIUM CHLORIDE 0.9 % IV SOLN: 1000.0000 mL | INTRAVENOUS | Status: DC

## 2015-05-16 MED ORDER — INSULIN ASPART 100 UNIT/ML ~~LOC~~ SOLN
0.0000 [IU] | Freq: Three times a day (TID) | SUBCUTANEOUS | Status: DC
  Administered 2015-05-18 – 2015-05-20 (×2): 1 [IU] via SUBCUTANEOUS

## 2015-05-16 MED ORDER — MORPHINE SULFATE 2 MG/ML IJ SOLN
1.0000 mg | INTRAMUSCULAR | Status: DC | PRN
  Administered 2015-05-17 – 2015-05-20 (×7): 1 mg via INTRAVENOUS
  Filled 2015-05-16 (×6): qty 1

## 2015-05-16 MED ORDER — VENLAFAXINE HCL 37.5 MG PO TABS
37.5000 mg | ORAL_TABLET | Freq: Two times a day (BID) | ORAL | Status: DC
  Administered 2015-05-16 – 2015-05-20 (×8): 37.5 mg via ORAL
  Filled 2015-05-16 (×11): qty 1

## 2015-05-16 MED ORDER — AMLODIPINE BESYLATE 10 MG PO TABS
10.0000 mg | ORAL_TABLET | Freq: Every day | ORAL | Status: DC
  Administered 2015-05-16 – 2015-05-20 (×4): 10 mg via ORAL
  Filled 2015-05-16: qty 2
  Filled 2015-05-16 (×3): qty 1

## 2015-05-16 MED ORDER — ASPIRIN 81 MG PO CHEW
324.0000 mg | CHEWABLE_TABLET | Freq: Once | ORAL | Status: AC
Start: 2015-05-16 — End: 2015-05-16
  Administered 2015-05-16: 324 mg via ORAL
  Filled 2015-05-16: qty 4

## 2015-05-16 MED ORDER — NITROGLYCERIN 0.4 MG SL SUBL: 0.4000 mg | SUBLINGUAL_TABLET | SUBLINGUAL | Status: DC | PRN

## 2015-05-16 MED ORDER — PREGABALIN 50 MG PO CAPS
50.0000 mg | ORAL_CAPSULE | Freq: Every day | ORAL | Status: DC
  Administered 2015-05-16 – 2015-05-20 (×4): 50 mg via ORAL
  Filled 2015-05-16 (×4): qty 1

## 2015-05-16 MED ORDER — SEVELAMER CARBONATE 800 MG PO TABS
2400.0000 mg | ORAL_TABLET | Freq: Three times a day (TID) | ORAL | Status: DC
  Administered 2015-05-16 – 2015-05-20 (×8): 2400 mg via ORAL
  Filled 2015-05-16 (×8): qty 3

## 2015-05-16 MED ORDER — NEPRO/CARBSTEADY PO LIQD
237.0000 mL | Freq: Two times a day (BID) | ORAL | Status: DC
  Administered 2015-05-17 – 2015-05-20 (×3): 237 mL via ORAL

## 2015-05-16 NOTE — ED Provider Notes (Signed)
CSN: [id number]     Arrival date & time 05/16/15  0829 History   First MD Initiated Contact with Patient 05/16/15 [phone]     Chief Complaint  Patient presents with  . Chest Pain   HPI Patient presents to the emergency room with complaints of sharp chest pain in the center of his chest. The patient was walking to the store this morning. He developed sharp central chest pain. The pain did not radiate. Nothing seemed to make it better or worse. He denies any trouble with shortness of breath or nausea. No coughing. No fever. The patient called EMS and was brought to the emergency department. Patient does have a history of chronic renal failure. He gets dialysis Tuesday Thursday Saturday. He did have dialysis on Saturday and had additional treatment for elevated potassium levels. Patient states he feels like his potassium may be elevated today. He had a similar episode in the past when his potassium was elevated that feels like today. Past Medical History  Diagnosis Date  . Hypertension   . ESRD on dialysis   . Diabetes mellitus with nephropathy   . Hematochezia     a. 2014: colonscopy, which showed moderately-sized internal hemorrhoids, two 6mm polyps in transverse colon and ascending colon that were resected, five 2-41mm polyps in sigmoid colon, descending colon, transverse colon, and ascending colon that were resected. An upper endoscopy was performed and showed normal esophagus, stomach, and duodenum.  . Hematuria     a. H/o hematuria 2014 with cystoscopy that was unrevealing for his source of hematuria. He underwent a kidney ultrasound on 10/14 that showed mildly echogenic and scarred kidneys compatible with medical renal disease, without hydronephrosis or renal calculi.  [name] Anemia   . Depression   . CAD (coronary artery disease)     a. per [facility] s/p BMS to mid LAD 12/2009 and DES to mid LAD 10/2010.  . Colon polyps   . Chronic diastolic CHF (congestive heart failure)    Past Surgical  History  Procedure Laterality Date  . Left heart catheterization with coronary angiogram N/A 11/23/2014    Procedure: LEFT HEART CATHETERIZATION WITH CORONARY ANGIOGRAM;  Surgeon: Troy Sine, MD;  Location: [facility] CATH LAB;  Service: Cardiovascular;  Laterality: N/A;   Family History  Problem Relation Age of Onset  . Hypertension    . Bone cancer Mother   . Anuerysm Father   . Diabetes type II Daughter    History  Substance Use Topics  . Smoking status: Former Smoker    Quit date: 12/06/2010  . Smokeless tobacco: Never Used  . Alcohol Use: No    Review of Systems  All other systems reviewed and are negative.     Allergies  Enalapril  Home Medications   Prior to Admission medications   Medication Sig Start Date End Date Taking? Authorizing Provider  acetaminophen (TYLENOL) 500 MG tablet Take 500 mg by mouth every 6 (six) hours as needed for headache.     Historical Provider, MD  amitriptyline (ELAVIL) 100 MG tablet Take 1 tablet (100 mg total) by mouth at bedtime. 12/15/14   Geradine Girt, DO  amLODipine (NORVASC) 10 MG tablet Take 10 mg by mouth daily.    Historical Provider, MD  aspirin 325 MG tablet Take 1 tablet (325 mg total) by mouth daily. 04/03/15   Orson Eva, MD  atorvastatin (LIPITOR) 20 MG tablet Take 20 mg by mouth daily.    Historical Provider, MD  calcium acetate (PHOSLO) 667  MG capsule Take 2 capsules (1,334 mg total) by mouth 3 (three) times daily with meals. 11/10/14   Juliet Rude, MD  carvedilol (COREG) 25 MG tablet Take 50 mg by mouth 2 (two) times daily.     Historical Provider, MD  cinacalcet (SENSIPAR) 90 MG tablet Take 90 mg by mouth daily.    Historical Provider, MD  clopidogrel (PLAVIX) 75 MG tablet Take 1 tablet (75 mg total) by mouth daily. 11/24/14   Juluis Mire, MD  Darbepoetin Alfa (ARANESP) 25 MCG/0.42ML SOSY injection Inject 0.42 mLs (25 mcg total) into the vein every Tuesday with hemodialysis. 12/15/14   Geradine Girt, DO  doxercalciferol  (HECTOROL) 4 MCG/2ML injection Inject 1 mL (2 mcg total) into the vein Every Tuesday,Thursday,and Saturday with dialysis. 12/15/14   Geradine Girt, DO  ferric gluconate 62.5 mg in sodium chloride 0.9 % 100 mL Inject 62.5 mg into the vein every Thursday with hemodialysis. 12/16/14   Geradine Girt, DO  finasteride (PROSCAR) 5 MG tablet Take 5 mg by mouth daily.    Historical Provider, MD  insulin aspart (NOVOLOG) 100 UNIT/ML injection Inject 15-20 Units into the skin 3 (three) times daily as needed for high blood sugar (CBG >150).     Historical Provider, MD  insulin glargine (LANTUS) 100 UNIT/ML injection Inject 10 Units into the skin at bedtime.    Historical Provider, MD  Ipratropium-Albuterol (COMBIVENT RESPIMAT) 20-100 MCG/ACT AERS respimat Inhale 2 puffs into the lungs every 6 (six) hours as needed for wheezing.    Historical Provider, MD  meclizine (ANTIVERT) 25 MG tablet Take 25 mg by mouth 3 (three) times daily as needed for dizziness.    Historical Provider, MD  multivitamin (RENA-VIT) TABS tablet Take 1 tablet by mouth at bedtime. 12/15/14   Geradine Girt, DO  ondansetron (ZOFRAN) 4 MG tablet Take 1 tablet (4 mg total) by mouth every 6 (six) hours. Patient taking differently: Take 4 mg by mouth every 6 (six) hours as needed for nausea or vomiting.  12/02/14   Britt Bottom, NP  pantoprazole (PROTONIX) 40 MG tablet Take 1 tablet (40 mg total) by mouth daily. 11/24/14   Juluis Mire, MD  pregabalin (LYRICA) 50 MG capsule Take 1 capsule (50 mg total) by mouth daily. 12/15/14   Geradine Girt, DO  ranolazine (RANEXA) 500 MG 12 hr tablet Take 1 tablet (500 mg total) by mouth 2 (two) times daily. 11/24/14   Juluis Mire, MD  sevelamer carbonate (RENVELA) 800 MG tablet Take 1,600-2,400 mg by mouth 3 (three) times daily with meals. Pt takes 3 capsules with breakfast, 2 capsules with lunch, and 3 capsules with dinner.    Historical Provider, MD  sucralfate (CARAFATE) 1 GM/10ML suspension Take 10 mLs  (1 g total) by mouth 4 (four) times daily -  with meals and at bedtime. 12/15/14   Geradine Girt, DO  venlafaxine (EFFEXOR) 37.5 MG tablet Take 37.5 mg by mouth 2 (two) times daily.    Historical Provider, MD   BP 182/109 mmHg  Pulse 100  Temp(Src) 98 F (36.7 C) (Oral)  Resp 18  SpO2 94% Physical Exam  Constitutional: He appears well-developed and well-nourished. No distress.  HENT:  Head: Normocephalic and atraumatic.  Right Ear: External ear normal.  Left Ear: External ear normal.  Eyes: Conjunctivae are normal. Right eye exhibits no discharge. Left eye exhibits no discharge. No scleral icterus.  Neck: Neck supple. No tracheal deviation present.  Cardiovascular: Normal rate, regular rhythm  and intact distal pulses.   Pulmonary/Chest: Effort normal and breath sounds normal. No stridor. No respiratory distress. He has no wheezes. He has no rales.  Abdominal: Soft. Bowel sounds are normal. He exhibits no distension. There is no tenderness. There is no rebound and no guarding.  Musculoskeletal: He exhibits no edema or tenderness.  AV fistula left upper extremity  Neurological: He is alert. He has normal strength. No cranial nerve deficit (no facial droop, extraocular movements intact, no slurred speech) or sensory deficit. He exhibits normal muscle tone. He displays no seizure activity. Coordination normal.  Skin: Skin is warm and dry. No rash noted.  Psychiatric: He has a normal mood and affect.  Nursing note and vitals reviewed.   ED Course  Procedures (including critical care time) Labs Review Labs Reviewed  CBC - Abnormal; Notable for the following:    RDW 18.5 (*)    All other components within normal limits  COMPREHENSIVE METABOLIC PANEL - Abnormal; Notable for the following:    Chloride 99 (*)    CO2 21 (*)    BUN 40 (*)    Creatinine, Ser 11.09 (*)    AST 13 (*)    ALT 10 (*)    GFR calc non Af Amer 4 (*)    GFR calc Af Amer 5 (*)    Anion gap 18 (*)    All other  components within normal limits  APTT  PROTIME-INR  I-STAT TROPOININ, ED    Imaging Review Dg Chest Portable 1 View  05/16/2015   CLINICAL DATA:  59 year old hypertensive diabetic male on dialysis presenting with 3 hours chest pain. Subsequent encounter.  EXAM: PORTABLE CHEST - 1 VIEW  COMPARISON:  03/31/2015  FINDINGS: Chronically elevated left hemidiaphragm.  Cardiomegaly with pulmonary vascular congestion.  No segmental consolidation or gross pneumothorax.  IMPRESSION: Cardiomegaly with pulmonary vascular congestion.   Electronically Signed   By: Genia Del M.D.   On: 05/16/2015 09:05     EKG Interpretation   Date/Time:  Monday May 16 2015 08:32:33 EDT Ventricular Rate:  103 PR Interval:  142 QRS Duration: 104 QT Interval:  403 QTC Calculation: 528 R Axis:   66 Text Interpretation:  Sinus tachycardia Probable left atrial enlargement  Left ventricular hypertrophy Prolonged QT interval Baseline wander in  lead(s) V3 V4 No significant change since last tracing Confirmed by April Carlyon   MD-J, Christopher Hink ([id number]) on 05/16/2015 8:46:49 AM      MDM   Final diagnoses:  None    Pt has history of CAD.  Reviewed records and pt did have a heart cath earlier this year with the following findings. .Normal LV function.No significant coronary obstructive disease with widely patent LAD stent in the proximal to midsegment with minimal 20% smooth in-stent narrowing and mild 20% ostial narrowing of the first diagonal branch within the stented segment     Presents with recurrent chest pain. .  Initial EKG without STEMI.  First troponin is normal.  Heart score of 5.  Will consult with medical service for admission, serial cardiac enzymes and further evaluation.    Dorie Rank, MD 05/16/15 [phone]

## 2015-05-16 NOTE — Progress Notes (Signed)
Initial Nutrition Assessment  DOCUMENTATION CODES:   Non-severe (moderate) malnutrition in context of acute illness/injury   Pt meets criteria for MODERATE MALNUTRITION in the context of acute illness as evidenced by energy intake </= 75% for > 7 days and moderate muscle mass loss.  INTERVENTION:   Provide Nepro Shake po BID, each supplement provides 425 kcal and 19 grams protein.  Encourage adequate PO intake.   NUTRITION DIAGNOSIS:   Increased nutrient needs related to chronic illness as evidenced by estimated needs.  GOAL:   Patient will meet greater than or equal to 90% of their needs  MONITOR:   PO intake, Supplement acceptance, Weight trends, Labs, I & O's  REASON FOR ASSESSMENT:   Consult  (weight loss)  ASSESSMENT:   59 y.o. male with HTN, ESRD on dialysis Tues/Thurs/Sat, Type 2 Diabetes Mellitus, and CAD who presented to the ED for sharp chest pain  Pt reports appetite is fine currently. Meal completion 100%. Pt reports over the past 1-2 weeks he he has been getting sick and has only been able to eat 2 meals a day when compared to his usual 3 meals daily. Pt reports weight loss. Per MD note, pt reports a 70 lb weight loss over the past year. Usual body weight reported to be ~250 (one year ago). Noted no new weight recorded thus pt was weighed on bed scale during time of visit which revealed 210 lbs. Pt with a 16% weight loss in 1 year, however not found significant. Pt is agreeable to Nepro Shake to aid in caloric and protein needs as well as in prevention of further weight loss. RD to order.   Nutrition-Focused physical exam completed. Findings are no fat depletion, moderate muscle depletion, and no edema.   Labs: Low chloride, CO2, ALT, AST, GFR. High BUN and creatinine.   Diet Order:  Diet renal/carb modified with fluid restriction Diet-HS Snack?: Nothing; Room service appropriate?: Yes; Fluid consistency:: Thin  Skin:  Reviewed, no issues  Last BM:   8/7  Height:   Ht Readings from Last 1 Encounters:  04/01/15 6' (1.829 m)    Weight:   Wt Readings from Last 1 Encounters:  05/16/15 210 lb (95.255 kg)    Ideal Body Weight:  80.9 kg  BMI:  Body mass index is 28.47 kg/(m^2).  Estimated Nutritional Needs:   Kcal:  2200-2400  Protein:  115-125 grams  Fluid:  1.2 L/day  EDUCATION NEEDS:   No education needs identified at this time  Corrin Parker, MS, RD, LDN Pager # [phone] After hours/ weekend pager # [phone]

## 2015-05-16 NOTE — H&P (Signed)
[facility] History and Physical  [name] [name] [id number] DOB: 1956-01-23 DOA: 05/16/2015  Referring physician: EDP PCP: [name]   Chief Complaint: Chest pain  HPI: [name] [name] is a 59 y.o. male with HTN, ESRD on dialysis Tues/Thurs/Sat, Type 2 Diabetes Mellitus, and CAD who presented to the ED for sharp chest pain that began at 6:00 am. The pain did not wake him out of sleep. It worsened while he was walking to the store and was accompanied with shortness of breath. The pain is sharp, constant, 6/10 with some radiation to his right elbow and occasional heart palpitations. Pain is better when laying down and worse with sitting forward and walking. He believed it was due to an elevated potassium, as he was told on Saturday at dialysis his potassium was high. He had two episodes of vomiting with no blood or abdominal pain. He also reports bilateral leg weakness that worsened as he was walking. Patient has been compliant with dialysis treatments and has not noticed any edema or weight gain. Per patient, he has lost approximately 70lbs over the past year and has had a suppressed appetite with vomiting after eating. Cardiac cath 11/23/14 demonstrated no significant coronary obstructive disease.   In the ED, he received 3 nitroglycerin tablets, ASA and morphine - his pain decreased to 4/10 and he no longer complains of shortness of breath. He was found to in a hypertensive urgency at 185/106. POC trop 0.03. CXR negative. EKG showed no acute changes. Platelets 100. Other labs unremarkable and consistent with findings for ESRD.   Review of Systems:  Constitutional: + weight loss, No night sweats, Fevers, chills, fatigue.  HEENT: No headaches, Difficulty swallowing,Sore throat, No sneezing, itching, ear ache, nasal congestion, post nasal drip, change in vision Cardio-vascular: +chest pain, palpitations, PND, No orthopnea,swelling in lower extremities, anasarca, dizziness GI: + vomiting,  No heartburn, indigestion, abdominal pain, nausea, diarrhea, change in bowel habits, loss of appetite  Resp: +cough, shortness of breath with exertion. No shortness of breath at rest. No excess mucus, No coughing up of blood.No change in color of mucus.No wheezing.No chest wall deformity  Skin: no rash or lesions.  GU: no dysuria, change in color of urine, no urgency or frequency. No flank pain.  Musculoskeletal: + bilateral leg weakness. No joint pain or swelling. No decreased range of motion. No back pain.  Psych: No change in mood or affect. No depression or anxiety. No memory loss.   Past Medical History  Diagnosis Date  . Hypertension   . ESRD on dialysis   . Diabetes mellitus with nephropathy   . Hematochezia     a. 2014: colonscopy, which showed moderately-sized internal hemorrhoids, two 37mm polyps in transverse colon and ascending colon that were resected, five 2-51mm polyps in sigmoid colon, descending colon, transverse colon, and ascending colon that were resected. An upper endoscopy was performed and showed normal esophagus, stomach, and duodenum.  . Hematuria     a. H/o hematuria 2014 with cystoscopy that was unrevealing for his source of hematuria. He underwent a kidney ultrasound on 10/14 that showed mildly echogenic and scarred kidneys compatible with medical renal disease, without hydronephrosis or renal calculi.  [name] Anemia   . Depression   . CAD (coronary artery disease)     a. per [facility] s/p BMS to mid LAD 12/2009 and DES to mid LAD 10/2010.  . Colon polyps   . Chronic diastolic CHF (congestive heart failure)    Past Surgical History  Procedure  Laterality Date  . Left heart catheterization with coronary angiogram N/A 11/23/2014    Procedure: LEFT HEART CATHETERIZATION WITH CORONARY ANGIOGRAM;  Surgeon: Troy Sine, MD;  Location: [facility] CATH LAB;  Service: Cardiovascular;  Laterality: N/A;   Social History:  reports that he quit smoking about 4 years ago. He has never  used smokeless tobacco. He reports that he does not drink alcohol or use illicit drugs. He smoked approximately 1 pack per week prior to quitting.   Allergies  Allergen Reactions  . Enalapril Hives    Family History  Problem Relation Age of Onset  . Hypertension    . Bone cancer Mother   . Anuerysm Father   . Diabetes type II Daughter     Prior to Admission medications   Medication Sig Start Date End Date Taking? Authorizing Provider  acetaminophen (TYLENOL) 500 MG tablet Take 500 mg by mouth every 6 (six) hours as needed for headache.     Historical Provider, MD  amitriptyline (ELAVIL) 100 MG tablet Take 1 tablet (100 mg total) by mouth at bedtime. 12/15/14   Geradine Girt, DO  amLODipine (NORVASC) 10 MG tablet Take 10 mg by mouth daily.    Historical Provider, MD  aspirin 325 MG tablet Take 1 tablet (325 mg total) by mouth daily. 04/03/15   Orson Eva, MD  atorvastatin (LIPITOR) 20 MG tablet Take 20 mg by mouth daily.    Historical Provider, MD  calcium acetate (PHOSLO) 667 MG capsule Take 2 capsules (1,334 mg total) by mouth 3 (three) times daily with meals. 11/10/14   Juliet Rude, MD  carvedilol (COREG) 25 MG tablet Take 50 mg by mouth 2 (two) times daily.     Historical Provider, MD  cinacalcet (SENSIPAR) 90 MG tablet Take 90 mg by mouth daily.    Historical Provider, MD  clopidogrel (PLAVIX) 75 MG tablet Take 1 tablet (75 mg total) by mouth daily. 11/24/14   Juluis Mire, MD  Darbepoetin Alfa (ARANESP) 25 MCG/0.42ML SOSY injection Inject 0.42 mLs (25 mcg total) into the vein every Tuesday with hemodialysis. 12/15/14   Geradine Girt, DO  doxercalciferol (HECTOROL) 4 MCG/2ML injection Inject 1 mL (2 mcg total) into the vein Every Tuesday,Thursday,and Saturday with dialysis. 12/15/14   Geradine Girt, DO  ferric gluconate 62.5 mg in sodium chloride 0.9 % 100 mL Inject 62.5 mg into the vein every Thursday with hemodialysis. 12/16/14   Geradine Girt, DO  finasteride (PROSCAR) 5 MG tablet  Take 5 mg by mouth daily.    Historical Provider, MD  insulin aspart (NOVOLOG) 100 UNIT/ML injection Inject 15-20 Units into the skin 3 (three) times daily as needed for high blood sugar (CBG >150).     Historical Provider, MD  insulin glargine (LANTUS) 100 UNIT/ML injection Inject 10 Units into the skin at bedtime.    Historical Provider, MD  Ipratropium-Albuterol (COMBIVENT RESPIMAT) 20-100 MCG/ACT AERS respimat Inhale 2 puffs into the lungs every 6 (six) hours as needed for wheezing.    Historical Provider, MD  meclizine (ANTIVERT) 25 MG tablet Take 25 mg by mouth 3 (three) times daily as needed for dizziness.    Historical Provider, MD  multivitamin (RENA-VIT) TABS tablet Take 1 tablet by mouth at bedtime. 12/15/14   Geradine Girt, DO  ondansetron (ZOFRAN) 4 MG tablet Take 1 tablet (4 mg total) by mouth every 6 (six) hours. Patient taking differently: Take 4 mg by mouth every 6 (six) hours as needed for nausea  or vomiting.  12/02/14   Britt Bottom, NP  pantoprazole (PROTONIX) 40 MG tablet Take 1 tablet (40 mg total) by mouth daily. 11/24/14   Juluis Mire, MD  pregabalin (LYRICA) 50 MG capsule Take 1 capsule (50 mg total) by mouth daily. 12/15/14   Geradine Girt, DO  ranolazine (RANEXA) 500 MG 12 hr tablet Take 1 tablet (500 mg total) by mouth 2 (two) times daily. 11/24/14   Juluis Mire, MD  sevelamer carbonate (RENVELA) 800 MG tablet Take 1,600-2,400 mg by mouth 3 (three) times daily with meals. Pt takes 3 capsules with breakfast, 2 capsules with lunch, and 3 capsules with dinner.    Historical Provider, MD  sucralfate (CARAFATE) 1 GM/10ML suspension Take 10 mLs (1 g total) by mouth 4 (four) times daily -  with meals and at bedtime. 12/15/14   Geradine Girt, DO  venlafaxine (EFFEXOR) 37.5 MG tablet Take 37.5 mg by mouth 2 (two) times daily.    Historical Provider, MD   Physical Exam: Filed Vitals:   05/16/15 0900 05/16/15 0945 05/16/15 1000 05/16/15 1015  BP: 178/105 182/109 176/104  163/99  Pulse: 100 100 102 90  Temp:      TempSrc:      Resp: 17 18 23 20   SpO2: 100% 94% 94% 94%    Wt Readings from Last 3 Encounters:  04/02/15 87.9 kg (193 lb 12.6 oz)  02/22/15 86.637 kg (191 lb)  12/14/14 91.309 kg (201 lb 4.8 oz)    General:  Appears calm and comfortable, laying in bed in no distress. Patient is pleasant with clear and fluent speech Eyes: normal lids, irises & conjunctiva ENT: grossly normal hearing, lips & tongue Neck: no LAD, masses or thyromegaly Cardiovascular: RRR, no m/r/g. No LE edema. No JVD. Respiratory: Normal respiratory effort. Bilateral basilar crackles. No wheezes. Abdomen: BS+, soft, non-tender, non-distended Skin: no rash or induration seen on limited exam Musculoskeletal: grossly normal tone BUE/BLE, 5/5 strength BUE/BLE Psychiatric: grossly normal mood and affect, speech fluent and appropriate Neurologic: grossly non-focal.         Labs on Admission:  Basic Metabolic Panel:  Recent Labs Lab 05/16/15 0858  NA 138  K 4.4  CL 99*  CO2 21*  GLUCOSE 86  BUN 40*  CREATININE 11.09*  CALCIUM 9.7   Liver Function Tests:  Recent Labs Lab 05/16/15 0858  AST 13*  ALT 10*  ALKPHOS 62  BILITOT 0.7  PROT 7.6  ALBUMIN 4.0   CBC:  Recent Labs Lab 05/16/15 0858  WBC 6.8  HGB 13.6  HCT 42.5  MCV 81.4  PLT 100*   BNP (last 3 results)  Recent Labs  11/20/14 2245 12/06/14 0845 12/12/14 2100  BNP 75.6 86.9 816.3*   Radiological Exams on Admission: Dg Chest Portable 1 View  05/16/2015   CLINICAL DATA:  59 year old hypertensive diabetic male on dialysis presenting with 3 hours chest pain. Subsequent encounter.  EXAM: PORTABLE CHEST - 1 VIEW  COMPARISON:  03/31/2015  FINDINGS: Chronically elevated left hemidiaphragm.  Cardiomegaly with pulmonary vascular congestion.  No segmental consolidation or gross pneumothorax.  IMPRESSION: Cardiomegaly with pulmonary vascular congestion.   Electronically Signed   By: Genia Del M.D.    On: 05/16/2015 09:05    EKG: Independently reviewed. Sinus tachycardia with LVH and prolonged QTc.  Assessment/Plan Principal Problem:   Chest pain Active Problems:   Hypertensive urgency   ESRD (end stage renal disease)   Hypertension   CAD (coronary artery disease) of artery bypass  graft   Diabetes mellitus type 2, insulin dependent   Thrombocytopenia   Weight loss  Chest pain - Exertional central chest pain with radiation to right arm and accompanying shortness of breath that improves with lying supine. Pain not reproducible. Improved with nitro, aspirin and morphine.  - POC trop 0.03, EKG shows no acute changes, CXR with cardiomegaly and pulmonary vascular congestion.  - Cath on 11/23/14 - normal LV function, no significant obstructive disease - Nitro, morphine PRN for pain - Serial troponins, repeat EKG if needed - Admit for observation on telemetry.  Cardiology not consulted at this point.  Hypertensive urgency - On amlodipine and carvedilol, hydralazine if SBP >180  ESRD  - Dialysis on Tues/Thurs/Sat. No signs for immediate dialysis. Will dialyze tomorrow per regular schedule.  Type 2 Diabetes Mellitus with peripheralneuropathy - Bilateral numbness and tingling of lower extremities, most recent A1C 5.7 - Monitor CBGs, SSI sensitive if needed.  May not be needed as patient has lost so much weight.  Follow CBGs. - On pregabalin  Thrombocytopenia - Plts 100, Check save smear, monitor CBC - On Plavix and ASA for previous TIA in June, 2016 - No DVT prophylaxis needed.  Weight loss - Reported 70lbs weight loss over past year, no melena, hematoschezia, hematemesis, dysphagia or odynphagia. Check guaiac.  - Vomiting 3-4 times per week after eating,consider consulting  GI or GI outpatient follow up. - Colonoscopy with multiple polyps that were removed and a normal endoscopy in 2014. - checking CT chest (on 8/9 just before HD)  CT abd/pelvis were normal in 02/2015. - Consult  dietician  CAD - On atorvastatin, Aspirin/Plavix (recent TIA)  Code Status: Full DVT Prophylaxis: SCDs Family Communication: None at bedside Disposition Plan: Admit to inpatient  Time spent: [address] PA-S Imogene Burn, [place] [facility] Pager [phone]

## 2015-05-16 NOTE — ED Notes (Signed)
Per ems- Pt reports he walked to the store this morning then had chest pain in the middle of chest that is sharp. Is dialysis pt, last on Saturday due Tuesday. Thinks his potassium is high, on Thursday and Saturday given potassium baths at dialysis. Pt is a x 4. BP 189/118, HR 102 ST, 16 RR, 75 CBG.

## 2015-05-16 NOTE — ED Notes (Signed)
Pt placed in gown and in bed. Pt monitored by pulse ox, bp cuff, and 12-lead. 

## 2015-05-16 NOTE — Progress Notes (Signed)
Patient arrived on unit from ED via stretcher.  No family at bedside.  Telemetry placed per MD order.  CMT notified.

## 2015-05-16 NOTE — Consult Note (Signed)
[facility] Renal Consultation Note    Indication for Consultation:  Management of ESRD/hemodialysis; anemia, hypertension/volume and secondary hyperparathyroidism PCP:  HPI: [name] [name] is a 59 y.o. male with ESRD secondary to DM who transferred to [place] from [facility] in March 2015 previously on HD four yeats with a hx of CAD with prior stents, normal cronaries Feb 2016, CHF, , poorly controlled HTN, and most recently in June 2016, TIA with neg MRI, MRA, neg carotids, EF 55%.  He has been having recent issues with cramping during his dialysis treatments and signing off after 3-3.5 hours on most occasions.  His kinetics are marginal and K in the 5-6s necessitating a 1 K 2.5 Ca bath- changed 8/4  July labs subsequently had a high Ca i the 11s and hectorol 5 was held per protocol.  His average UF volume is 2.2 - 3.9 per HD.  His EDW was raised 0.5 kg 8/2 to 89.  Pre BP are variable ranging from 118/72 7/30 to 167/1018/6.  His post HD BP was 152/86 sitting and 143/91 standing 8/3 at a weight of 89.8.  Today he walked about a block to the dollar store to buy some aspirin.  When he arrived he had SOB, CP/sharp  and bilateral leg weakness and tremulousness.  The store manager asked if he was ok, he said not and EMS was subsequently called.  Evaluation in the ED showed Hgb 13.6, platelets 100, CXR showed pul congestion, Chest CT showed negative acute findings.  EKG showed sinus tach, prolonged QT K 4.4 Ca 9.7 Alb 4, Glu 86.  Trop 0.03, 0.05. He had a prior neg heart cath last February. He reportedly had a large weight loss this past year However, renal consults note showed EDW has been fairly stable since February 2016at which time his EDW was 90 . Since then it has ranged between 88 -90.  Prior to that it was 92 Dec, 95 Nov, 96 Oct, 100 Sept and 103 August 2015 (fairly stable the past 6 months) . He lives with his son who smokes, but he does not smoke anymore. He has had some cough,no N,  D fever or  chills.  He has a twin brother who has severe heart disease/CHF.  He is due for dialysis on Tuesday. He has post prandial vomiting at times and has severe neuropathy.  Past Medical History  Diagnosis Date  . Hypertension   . ESRD on dialysis   . Diabetes mellitus with nephropathy   . Hematochezia     a. 2014: colonscopy, which showed moderately-sized internal hemorrhoids, two 60mm polyps in transverse colon and ascending colon that were resected, five 2-25mm polyps in sigmoid colon, descending colon, transverse colon, and ascending colon that were resected. An upper endoscopy was performed and showed normal esophagus, stomach, and duodenum.  . Hematuria     a. H/o hematuria 2014 with cystoscopy that was unrevealing for his source of hematuria. He underwent a kidney ultrasound on 10/14 that showed mildly echogenic and scarred kidneys compatible with medical renal disease, without hydronephrosis or renal calculi.  [name] Anemia   . Depression   . CAD (coronary artery disease)     a. per [facility] s/p BMS to mid LAD 12/2009 and DES to mid LAD 10/2010.  . Colon polyps   . Chronic diastolic CHF (congestive heart failure)    Past Surgical History  Procedure Laterality Date  . Left heart catheterization with coronary angiogram N/A 11/23/2014    Procedure: LEFT HEART CATHETERIZATION  WITH CORONARY ANGIOGRAM;  Surgeon: Troy Sine, MD;  Location: [facility] C[facility] LAB;  Service: Cardiovascular;  Laterality: N/A;   Family History  Problem Relation Age of Onset  . Hypertension    . Bone cancer Mother   . Anuerysm Father   . Diabetes type II Daughter    Social History:  reports that he quit smoking about 4 years ago. He has never used smokeless tobacco. He reports that he does not drink alcohol or use illicit drugs. Allergies  Allergen Reactions  . Enalapril Hives   Prior to Admission medications   Medication Sig Start Date End Date Taking? Authorizing Provider  acetaminophen (TYLENOL) 500 MG tablet  Take 500 mg by mouth every 6 (six) hours as needed for headache.    Yes Historical Provider, MD  amitriptyline (ELAVIL) 100 MG tablet Take 1 tablet (100 mg total) by mouth at bedtime. 12/15/14  Yes Jessica U Vann, DO  amLODipine (NORVASC) 10 MG tablet Take 10 mg by mouth daily.   Yes Historical Provider, MD  aspirin 325 MG tablet Take 1 tablet (325 mg total) by mouth daily. 04/03/15  Yes Orson Eva, MD  atorvastatin (LIPITOR) 20 MG tablet Take 20 mg by mouth at bedtime.    Yes Historical Provider, MD  carvedilol (COREG) 25 MG tablet Take 50 mg by mouth 2 (two) times daily.    Yes Historical Provider, MD  cinacalcet (SENSIPAR) 90 MG tablet Take 90 mg by mouth at bedtime.    Yes Historical Provider, MD  clopidogrel (PLAVIX) 75 MG tablet Take 1 tablet (75 mg total) by mouth daily. 11/24/14  Yes Marjan Rabbani, MD  finasteride (PROSCAR) 5 MG tablet Take 5 mg by mouth daily.   Yes Historical Provider, MD  insulin glargine (LANTUS) 100 UNIT/ML injection Inject 10 Units into the skin at bedtime.   Yes Historical Provider, MD  multivitamin (RENA-VIT) TABS tablet Take 1 tablet by mouth at bedtime. 12/15/14  Yes Geradine Girt, DO  pregabalin (LYRICA) 50 MG capsule Take 1 capsule (50 mg total) by mouth daily. 12/15/14  Yes Geradine Girt, DO  ranolazine (RANEXA) 500 MG 12 hr tablet Take 1 tablet (500 mg total) by mouth 2 (two) times daily. 11/24/14  Yes Juluis Mire, MD  sevelamer carbonate (RENVELA) 800 MG tablet Take 2,400 mg by mouth 3 (three) times daily with meals.    Yes Historical Provider, MD  sucralfate (CARAFATE) 1 GM/10ML suspension Take 10 mLs (1 g total) by mouth 4 (four) times daily -  with meals and at bedtime. Patient taking differently: Take 1 g by mouth at bedtime.  12/15/14  Yes Geradine Girt, DO  venlafaxine (EFFEXOR) 37.5 MG tablet Take 37.5 mg by mouth 2 (two) times daily.   Yes Historical Provider, MD  calcium acetate (PHOSLO) 667 MG capsule Take 2 capsules (1,334 mg total) by mouth 3 (three)  times daily with meals. Patient not taking: Reported on 05/16/2015 11/10/14   Juliet Rude, MD  Darbepoetin Alfa (ARANESP) 25 MCG/0.42ML SOSY injection Inject 0.42 mLs (25 mcg total) into the vein every Tuesday with hemodialysis. 12/15/14   Geradine Girt, DO  doxercalciferol (HECTOROL) 4 MCG/2ML injection Inject 1 mL (2 mcg total) into the vein Every Tuesday,Thursday,and Saturday with dialysis. 12/15/14   Geradine Girt, DO  ferric gluconate 62.5 mg in sodium chloride 0.9 % 100 mL Inject 62.5 mg into the vein every Thursday with hemodialysis. 12/16/14   Geradine Girt, DO  insulin aspart (NOVOLOG) 100  UNIT/ML injection Inject 15-20 Units into the skin 3 (three) times daily as needed for high blood sugar (CBG >150).     Historical Provider, MD  Ipratropium-Albuterol (COMBIVENT RESPIMAT) 20-100 MCG/ACT AERS respimat Inhale 2 puffs into the lungs every 6 (six) hours as needed for wheezing.    Historical Provider, MD  meclizine (ANTIVERT) 25 MG tablet Take 25 mg by mouth 3 (three) times daily as needed for dizziness.    Historical Provider, MD  ondansetron (ZOFRAN) 4 MG tablet Take 1 tablet (4 mg total) by mouth every 6 (six) hours. Patient not taking: Reported on 05/16/2015 12/02/14   Britt Bottom, NP  pantoprazole (PROTONIX) 40 MG tablet Take 1 tablet (40 mg total) by mouth daily. Patient not taking: Reported on 05/16/2015 11/24/14   Juluis Mire, MD   Current Facility-Administered Medications  Medication Dose Route Frequency Provider Last Rate Last Dose  . 0.9 %  sodium chloride infusion  1,000 mL Intravenous Continuous Dorie Rank, MD      . acetaminophen (TYLENOL) tablet 650 mg  650 mg Oral Q4H PRN Melton Alar, PA-C      . amitriptyline (ELAVIL) tablet 100 mg  100 mg Oral QHS Marianne L York, PA-C      . amLODipine (NORVASC) tablet 10 mg  10 mg Oral Daily Melton Alar, PA-C   10 mg at 05/16/15 1240  . aspirin tablet 325 mg  325 mg Oral Daily Melton Alar, PA-C   325 mg at 05/16/15 1234  .  atorvastatin (LIPITOR) tablet 20 mg  20 mg Oral QHS Marianne L York, PA-C      . carvedilol (COREG) tablet 50 mg  50 mg Oral BID WC Marianne L York, PA-C      . cinacalcet (SENSIPAR) tablet 90 mg  90 mg Oral Q supper Nishant Dhungel, MD      . clopidogrel (PLAVIX) tablet 75 mg  75 mg Oral Daily Melton Alar, PA-C   75 mg at 05/16/15 1240  . [START ON 05/17/2015] doxercalciferol (HECTOROL) injection 2 mcg  2 mcg Intravenous Q T,Th,Sa-HD Bobby Rumpf York, PA-C      . [START ON 05/19/2015] ferric gluconate (NULECIT) 62.5 mg in sodium chloride 0.9 % 100 mL IVPB  62.5 mg Intravenous Q Thu-HD Marianne L York, PA-C      . finasteride (PROSCAR) tablet 5 mg  5 mg Oral Daily Melton Alar, PA-C   5 mg at 05/16/15 1415  . gi cocktail (Maalox,Lidocaine,Donnatal)  30 mL Oral BID PRN Melton Alar, PA-C   30 mL at 05/16/15 1300  . hydrALAZINE (APRESOLINE) injection 10 mg  10 mg Intravenous Q6H PRN Melton Alar, PA-C      . insulin aspart (novoLOG) injection 0-9 Units  0-9 Units Subcutaneous TID WC Marianne L York, PA-C      . ipratropium-albuterol (DUONEB) 0.5-2.5 (3) MG/3ML nebulizer solution 3 mL  3 mL Inhalation Q6H PRN Melton Alar, PA-C      . morphine 2 MG/ML injection 1 mg  1 mg Intravenous Q4H PRN Melton Alar, PA-C      . multivitamin (RENA-VIT) tablet 1 tablet  1 tablet Oral QHS Marianne L York, PA-C      . nitroGLYCERIN (NITROGLYN) 2 % ointment 1 inch  1 inch Topical 4 times per day Dorie Rank, MD   1 inch at 05/16/15 1157  . nitroGLYCERIN (NITROSTAT) SL tablet 0.4 mg  0.4 mg Sublingual Q5 min PRN Dorie Rank, MD      .  ondansetron (ZOFRAN) injection 4 mg  4 mg Intravenous Q6H PRN Melton Alar, PA-C      . pantoprazole (PROTONIX) EC tablet 40 mg  40 mg Oral Daily Melton Alar, PA-C   40 mg at 05/16/15 1240  . pregabalin (LYRICA) capsule 50 mg  50 mg Oral Daily Melton Alar, PA-C   50 mg at 05/16/15 1240  . ranolazine (RANEXA) 12 hr tablet 500 mg  500 mg Oral BID Melton Alar, PA-C    500 mg at 05/16/15 1415  . sevelamer carbonate (RENVELA) tablet 2,400 mg  2,400 mg Oral TID WC Marianne L York, PA-C      . venlafaxine [facility]) tablet 37.5 mg  37.5 mg Oral BID Melton Alar, PA-C       Labs: Basic Metabolic Panel:  Recent Labs Lab 05/16/15 0858  NA 138  K 4.4  CL 99*  CO2 21*  GLUCOSE 86  BUN 40*  CREATININE 11.09*  CALCIUM 9.7   Liver Function Tests:  Recent Labs Lab 05/16/15 0858  AST 13*  ALT 10*  ALKPHOS 62  BILITOT 0.7  PROT 7.6  ALBUMIN 4.0   CBC:  Recent Labs Lab 05/16/15 0858  WBC 6.8  HGB 13.6  HCT 42.5  MCV 81.4  PLT 100*   CBG:  Recent Labs Lab 05/16/15 1357  GLUCAP 75   Studies/Results: Ct Chest Wo Contrast  05/16/2015   CLINICAL DATA:  Chest pain and recent weight loss  EXAM: CT CHEST WITHOUT CONTRAST  TECHNIQUE: Multidetector CT imaging of the chest was performed following the standard protocol without IV contrast.  COMPARISON:  Chest x-ray from earlier in the same day  FINDINGS: The lungs are well aerated bilaterally. The focal infiltrate or sizable effusion is seen. No parenchymal nodules are noted.  The thoracic inlet is within normal limits. Aortic atherosclerotic calcifications are seen without aneurysmal dilatation. Heavy coronary calcifications are noted. No significant hilar or mediastinal adenopathy is noted. A few small lymph nodes are noted in the pretracheal region but demonstrate normal fatty hila.  The upper abdomen shows no acute abnormality. No acute bony abnormality is noted.  IMPRESSION: Aortic atherosclerotic disease and heavy coronary calcifications.  No acute abnormality is identified.   Electronically Signed   By: Inez Catalina M.D.   On: 05/16/2015 13:26   Dg Chest Portable 1 View  05/16/2015   CLINICAL DATA:  59 year old hypertensive diabetic male on dialysis presenting with 3 hours chest pain. Subsequent encounter.  EXAM: PORTABLE CHEST - 1 VIEW  COMPARISON:  03/31/2015  FINDINGS: Chronically elevated  left hemidiaphragm.  Cardiomegaly with pulmonary vascular congestion.  No segmental consolidation or gross pneumothorax.  IMPRESSION: Cardiomegaly with pulmonary vascular congestion.   Electronically Signed   By: Genia Del M.D.   On: 05/16/2015 09:05    ROS: As per HPI otherwise negative. Physical Exam: Filed Vitals:   05/16/15 1215 05/16/15 1230 05/16/15 1234 05/16/15 1353  BP: 159/86 176/89 176/89 180/97  Pulse: 101 91 95 93  Temp:    97.8 F (36.6 C)  TempSrc:    Oral  Resp: 25 23 20 17   SpO2: 96% 95% 99% 99%     General: Well developed, well nourished, in no acute distress, but O2 sats reading 87 - 90% and pt is on room air Head: Normocephalic, atraumatic, sclera non-icteric, mucus membranes are moist Neck: Supple. JVD + to jaw Lungs: Bilateral crackles; Breathing is unlabored. Heart: tachy rate 100 reg Abdomen: Soft,  RUQ  tender, non-distended with normoactive bowel sounds. No rebound/guarding.  M-S:  Strength and tone appear normal for age. Lower extremities:without edema or ischemic changes, no open wounds  Neuro: Alert and oriented X 3. Moves all extremities spontaneously. Psych:  Responds to questions appropriately with a normal affect. Skin:  Warm and dry, multiple tattos Dialysis Access:left upper AVF + bruit  Dialysis Orders: AF TTS 4 hr 180 500/800 EDW 88 2 K 2.25 Ca heparin 8000 left upper AVF no heparin Hectorol 5 on hold since 7/5 due to hypercalcemia,  venofer 100 x 5 through 8/11, Mircera 100 q 2 weeks last given 7/26 - on hold Recent labs: Hgb 12 8/4 up from 10.5 7/7 25% sat ferritin 826 04/2015, iPTH 410 04/2015  Assessment/Plan: 1.  Chest pain -seems to have been induced by walking with SOB, ^ BP but no more so than usual. Multiple risk factors, but neg heart cath this past Feb. Mild volume excess, Sats a little low- advisded RN to start O2 2.  ESRD -  TTS - prone to cramping - HD first round in the am to see if volume removal helps - use 2 K bath; he can  stand for weights 3.  Hypertension/volume  - Not sure why so much variability- unless med compliance norvasc10, coreg 50 bid (outpt med list just said 25 bid) 4.  Anemia  - Hgb 13.6 - ESA on hold - hold on weekly Fe for now 5.  Metabolic bone disease -  Prone to hypercalcemia, Hectorol on hold His 1 K bath for hyperkalemia only comes with a 2.5 Ca bath so that aggravates the problem.  Needs to stay on his full treatments and this would get the K down - plan 2 Ca bath while here and would consider at d/c so we can resume Hectorol; History on noncompliance with binders with elevated P - supposed to take  3 renvela ac and 2 with snacks and 90 sensipar  6.  Nutrition -changed to renal carb mod as heart healthy diet is high in K, fluid restriction/vitamin Alb 4, Weight essentially stable x 6 months Alb 4 7. Thrombocytopenia - platelets usually 200s with monthly labs, down to 162 7/28 and now 100 , need to follow; he has been on no heparin dialysis. 8. DM - per primary 9. Hx TIA - needs BP control 10. Hx CAD on asa, plavix, BB and ranexa, statin 11. GERD - on PPI, GI cocktain prn - monitor use 12. Depression on effexor   Myriam Jacobson, PA-C [place] [phone] 05/16/2015, 2:48 PM   Pt seen, examined and agree w A/P as above.  Kelly Splinter MD pager [phone]    cell [phone] 05/16/2015, 5:27 PM

## 2015-05-17 ENCOUNTER — Encounter (HOSPITAL_COMMUNITY): Payer: Self-pay | Admitting: Radiology

## 2015-05-17 ENCOUNTER — Observation Stay (HOSPITAL_COMMUNITY): Payer: Medicare Other

## 2015-05-17 DIAGNOSIS — R079 Chest pain, unspecified: Secondary | ICD-10-CM | POA: Diagnosis not present

## 2015-05-17 DIAGNOSIS — E440 Moderate protein-calorie malnutrition: Secondary | ICD-10-CM | POA: Diagnosis present

## 2015-05-17 DIAGNOSIS — D696 Thrombocytopenia, unspecified: Secondary | ICD-10-CM | POA: Diagnosis not present

## 2015-05-17 DIAGNOSIS — E119 Type 2 diabetes mellitus without complications: Secondary | ICD-10-CM | POA: Diagnosis not present

## 2015-05-17 DIAGNOSIS — R0789 Other chest pain: Secondary | ICD-10-CM | POA: Diagnosis not present

## 2015-05-17 DIAGNOSIS — I10 Essential (primary) hypertension: Secondary | ICD-10-CM | POA: Diagnosis not present

## 2015-05-17 HISTORY — DX: Moderate protein-calorie malnutrition: E44.0

## 2015-05-17 LAB — RENAL FUNCTION PANEL
Albumin: 3.3 g/dL — ABNORMAL LOW (ref 3.5–5.0)
Anion gap: 15 (ref 5–15)
BUN: 54 mg/dL — ABNORMAL HIGH (ref 6–20)
CO2: 23 mmol/L (ref 22–32)
Calcium: 9 mg/dL (ref 8.9–10.3)
Chloride: 96 mmol/L — ABNORMAL LOW (ref 101–111)
Creatinine, Ser: 12.77 mg/dL — ABNORMAL HIGH (ref 0.61–1.24)
GFR calc Af Amer: 4 mL/min — ABNORMAL LOW (ref >60)
GFR, EST NON AFRICAN AMERICAN: 4 mL/min — AB (ref >60)
Glucose, Bld: 151 mg/dL — ABNORMAL HIGH (ref 65–99)
PHOSPHORUS: 9.7 mg/dL — AB (ref 2.5–4.6)
POTASSIUM: 4.9 mmol/L (ref 3.5–5.1)
SODIUM: 134 mmol/L — AB (ref 135–145)

## 2015-05-17 LAB — CBC WITH DIFFERENTIAL/PLATELET
Basophils Absolute: 0 K/uL (ref 0.0–0.1)
Basophils Relative: 0 % (ref 0–1)
Eosinophils Absolute: 0.2 K/uL (ref 0.0–0.7)
Eosinophils Relative: 4 % (ref 0–5)
HCT: 36.1 % — ABNORMAL LOW (ref 39.0–52.0)
Hemoglobin: 11.6 g/dL — ABNORMAL LOW (ref 13.0–17.0)
Lymphocytes Relative: 16 % (ref 12–46)
Lymphs Abs: 0.9 K/uL (ref 0.7–4.0)
MCH: 26 pg (ref 26.0–34.0)
MCHC: 32.1 g/dL (ref 30.0–36.0)
MCV: 80.8 fL (ref 78.0–100.0)
Monocytes Absolute: 0.5 K/uL (ref 0.1–1.0)
Monocytes Relative: 10 % (ref 3–12)
Neutro Abs: 3.8 K/uL (ref 1.7–7.7)
Neutrophils Relative %: 70 % (ref 43–77)
Platelets: 104 K/uL — ABNORMAL LOW (ref 150–400)
RBC: 4.47 MIL/uL (ref 4.22–5.81)
RDW: 18.6 % — ABNORMAL HIGH (ref 11.5–15.5)
WBC: 5.4 K/uL (ref 4.0–10.5)

## 2015-05-17 LAB — VITAMIN B12: Vitamin B-12: 359 pg/mL (ref 180–914)

## 2015-05-17 LAB — GLUCOSE, CAPILLARY
GLUCOSE-CAPILLARY: 104 mg/dL — AB (ref 65–99)
GLUCOSE-CAPILLARY: 149 mg/dL — AB (ref 65–99)
Glucose-Capillary: 127 mg/dL — ABNORMAL HIGH (ref 65–99)

## 2015-05-17 LAB — TROPONIN I: Troponin I: 0.03 ng/mL (ref <0.031)

## 2015-05-17 MED ORDER — NEPRO/CARBSTEADY PO LIQD: 237.0000 mL | ORAL | Status: DC | PRN

## 2015-05-17 MED ORDER — DOXERCALCIFEROL 4 MCG/2ML IV SOLN
INTRAVENOUS | Status: AC
  Filled 2015-05-17: qty 2

## 2015-05-17 MED ORDER — SODIUM CHLORIDE 0.9 % IV SOLN: 100.0000 mL | INTRAVENOUS | Status: DC | PRN

## 2015-05-17 MED ORDER — IOHEXOL 350 MG/ML SOLN
100.0000 mL | Freq: Once | INTRAVENOUS | Status: AC | PRN
  Administered 2015-05-17: 100 mL via INTRAVENOUS

## 2015-05-17 MED ORDER — PENTAFLUOROPROP-TETRAFLUOROETH EX AERO: 1.0000 "application " | INHALATION_SPRAY | CUTANEOUS | Status: DC | PRN

## 2015-05-17 MED ORDER — LIDOCAINE HCL (PF) 1 % IJ SOLN: 5.0000 mL | INTRAMUSCULAR | Status: DC | PRN

## 2015-05-17 MED ORDER — LIDOCAINE-PRILOCAINE 2.5-2.5 % EX CREA
1.0000 "application " | TOPICAL_CREAM | CUTANEOUS | Status: DC | PRN
  Filled 2015-05-17: qty 5

## 2015-05-17 MED ORDER — ALTEPLASE 2 MG IJ SOLR
2.0000 mg | Freq: Once | INTRAMUSCULAR | Status: DC | PRN
  Filled 2015-05-17: qty 2

## 2015-05-17 NOTE — Progress Notes (Signed)
  [facility] Progress Note   Subjective: alert, no complaints  Filed Vitals:   05/17/15 0830 05/17/15 0900 05/17/15 0930 05/17/15 1000  BP: 141/71 136/82 152/72 144/84  Pulse: 76 74 92 75  Temp:      TempSrc:      Resp: 12 14 14 14   Height:      Weight:      SpO2:       Exam: Alert, no distress No jvd Chest clear bilat RRR no MRG Abd soft ntnd no ascites No LE edema  LUA AVF +bruit Neuro is nf, Ox 3  TTS AF  4h  500800  88kg  2/2.25 bath  Heparin 8000  LUA AVF Venofer 100 x 5 thru 8/11 + Mircera 100 q 2 , last 7/26 on hold Last ferr 826, pth 410, tsat 25%     Assessment: 1. Chest pain / hx coronary stents x 2 (LAD x 2 in 2012) - per primary 2. ESRD HD TTS 3. HTN  4. DM 2  w neuropathy 5. Thrombocytopenia - new issue, usually around 200 6. Anemia cont esa/ Fe 7. MBD cont meds  Plan - HD today, UF to dry wt    Kelly Splinter MD  pager [phone]    cell [phone]  05/17/2015, 10:44 AM     Recent Labs Lab 05/16/15 0858 05/17/15 0754  NA 138 134*  K 4.4 4.9  CL 99* 96*  CO2 21* 23  GLUCOSE 86 151*  BUN 40* 54*  CREATININE 11.09* 12.77*  CALCIUM 9.7 9.0  PHOS  --  9.7*    Recent Labs Lab 05/16/15 0858 05/17/15 0754  AST 13*  --   ALT 10*  --   ALKPHOS 62  --   BILITOT 0.7  --   PROT 7.6  --   ALBUMIN 4.0 3.3*    Recent Labs Lab 05/16/15 0858 05/17/15 0754  WBC 6.8 5.4  NEUTROABS  --  3.8  HGB 13.6 11.6*  HCT 42.5 36.1*  MCV 81.4 80.8  PLT 100* 104*   . amitriptyline  100 mg Oral QHS  . amLODipine  10 mg Oral Daily  . aspirin  325 mg Oral Daily  . atorvastatin  20 mg Oral QHS  . carvedilol  50 mg Oral BID WC  . cinacalcet  90 mg Oral Q supper  . clopidogrel  75 mg Oral Daily  . doxercalciferol      . doxercalciferol  2 mcg Intravenous Q T,Th,Sa-HD  . feeding supplement (NEPRO CARB STEADY)  237 mL Oral BID BM  . finasteride  5 mg Oral Daily  . insulin aspart  0-9 Units Subcutaneous TID WC  . multivitamin  1 tablet  Oral QHS  . nitroGLYCERIN  1 inch Topical 4 times per day  . pantoprazole  40 mg Oral Daily  . pregabalin  50 mg Oral Daily  . ranolazine  500 mg Oral BID  . sevelamer carbonate  2,400 mg Oral TID WC  . venlafaxine  37.5 mg Oral BID   . sodium chloride     sodium chloride, sodium chloride, acetaminophen, alteplase, feeding supplement (NEPRO CARB STEADY), gi cocktail, hydrALAZINE, ipratropium-albuterol, lidocaine (PF), lidocaine-prilocaine, morphine injection, nitroGLYCERIN, ondansetron (ZOFRAN) IV, pentafluoroprop-tetrafluoroeth

## 2015-05-17 NOTE — Progress Notes (Signed)
PROGRESS NOTE  [name] [name] [id number] DOB: [birth year] DOA: 05/16/2015 PCP: [name]  Brief history 59 year old male with a history of diabetes mellitus, ESRD, CAD, diastolic CHF, hypertension, COPD with chest pain that began on the morning of August 2016. He stated that worsened with ambulation with radiation to the right elbow. There was also some improvement when he laid supine. The patient had chest pain during his last hospital admission, but he stated that it had improved up until 08/0/2016. He denies any shortness of breath, dizziness, syncope. Assessment/Plan: Atypical Chest pain - Exertional central chest pain with radiation to right arm and accompanying shortness of breath that improves with lying supine. -The patient had heart catheterization 11/23/2014 which was negative for significant coronary artery disease -Suspect his chest pain partly related to his uncontrolled hypertension with some demand - suspect a degree of noncompliance as the patient's blood pressure is usually improved during his hospitalizations - POC trop 0.03, EKG shows no acute changes,  -CXR with cardiomegaly and pulmonary vascular congestion.  - Cath on 11/23/14 - normal LV function, no significant obstructive disease - Serial troponins negx3 - Cardiology not consulted at this point. -05/16/15--CT chest without contrast negative for acute findings -Obtain CT Angio of the chest as the patient was mildly hypoxemic this morning and was placed on supplemental oxygen TIA HX -6/24--MRI brain No acute intracranial process, specifically no acute ischemia.  -6/24--MRA brain No acute large vessel occlusion or high-grade stenosis. -6/24--Carotid duplex negative for hemodynamically significant stenosis -12/06/2014 echocardiogram shows EF [id number], grade 1 diastolic dysfunction, no WMA -12/13/2014 hemoglobin A1c 5.5 -LDL 48  -continue aspirin 325 mg and plavix  Thrombocytopenia - Plts 100, Check  save smear, monitor CBC - On Plavix and ASA for previous TIA in June, 2016 - check B12 and HIT panel Hypertensive urgency  -Patient had blood pressure 192/104 in the emergency department  -Continue amlodipine, carvedilol-->controlled -Suspect a degree of noncompliance as the patient's blood pressure is better controlled during his hospitalizations ESRD  -Patient received dialysis 04/01/2015 and 03/02/2015 -Appreciate nephrology  GERD  -Continue Carafate and PPI  Coronary artery disease  -History of PCI 09 Oct 2010  -February 2016 cardiac catheterization negative for obstructive lesions  -Continue aspirin, Plavix, Ranexa -The patient continued to have intermittent chest discomfort during his last hospitalization -Recent VQ scan in March 2016 was negative and the patient had cardiac catheterization February 2016 without any obstructive lesions.  -Patient remained hemodynamically stable without tachycardia, hypoxemia, hypotension  Hyperlipidemia  -Continue Lipitor 20 mg daily  -LDL 48  Diabetes mellitus type 2 with polyneuropathy -04/01/15 hemoglobin A1c 5.7  -SSI for not as pt not eating well -CBGs controlled Vomiting -tolerated clears--advance to renal diet -?previously improved with carafate -?related to taking too many pills at one time-- patient seems to have vomiting after taking many pills -continue protonix; previously on carafate--may need to add -LFTs--WNL - 12/13/2014 gastric emptying study within normal limits - 02/23/2015 RUQ [place]-- negative for cholecystitis or cholelithiasis   Family Communication:   Pt at beside Disposition Plan:   Home when medically stable     Procedures/Studies: Ct Chest Wo Contrast  05/16/2015   CLINICAL DATA:  Chest pain and recent weight loss  EXAM: CT CHEST WITHOUT CONTRAST  TECHNIQUE: Multidetector CT imaging of the chest was performed following the standard protocol without IV contrast.  COMPARISON:  Chest x-ray from  earlier in the same day  FINDINGS: The lungs  are well aerated bilaterally. The focal infiltrate or sizable effusion is seen. No parenchymal nodules are noted.  The thoracic inlet is within normal limits. Aortic atherosclerotic calcifications are seen without aneurysmal dilatation. Heavy coronary calcifications are noted. No significant hilar or mediastinal adenopathy is noted. A few small lymph nodes are noted in the pretracheal region but demonstrate normal fatty hila.  The upper abdomen shows no acute abnormality. No acute bony abnormality is noted.  IMPRESSION: Aortic atherosclerotic disease and heavy coronary calcifications.  No acute abnormality is identified.   Electronically Signed   By: Inez Catalina M.D.   On: 05/16/2015 13:26   Dg Chest Portable 1 View  05/16/2015   CLINICAL DATA:  59 year old hypertensive diabetic male on dialysis presenting with 3 hours chest pain. Subsequent encounter.  EXAM: PORTABLE CHEST - 1 VIEW  COMPARISON:  03/31/2015  FINDINGS: Chronically elevated left hemidiaphragm.  Cardiomegaly with pulmonary vascular congestion.  No segmental consolidation or gross pneumothorax.  IMPRESSION: Cardiomegaly with pulmonary vascular congestion.   Electronically Signed   By: Genia Del M.D.   On: 05/16/2015 09:05         Subjective:   Objective: Filed Vitals:   05/17/15 1100 05/17/15 1120 05/17/15 1127 05/17/15 1259  BP: 140/82 134/81 144/83 150/83  Pulse: 79 80 81 87  Temp:    97.4 F (36.3 C)  TempSrc:    Oral  Resp: 24 22 17 20   Height:      Weight:   88.7 kg (195 lb 8.8 oz)   SpO2:    100%    Intake/Output Summary (Last 24 hours) at 05/17/15 1350 Last data filed at 05/17/15 1350  Gross per 24 hour  Intake    942 ml  Output   3000 ml  Net  -2058 ml   Weight change:  Exam:   General:  Pt is alert, follows commands appropriately, not in acute distress  HEENT: No icterus, No thrush, No neck mass, [place]/AT  Cardiovascular: RRR, S1/S2, no rubs, no  gallops  Respiratory: CTA bilaterally, no wheezing, no crackles, no rhonchi  Abdomen: Soft/+BS, non tender, non distended, no guarding  Extremities: No edema, No lymphangitis, No petechiae, No rashes, no synovitis  Data Reviewed: Basic Metabolic Panel:  Recent Labs Lab 05/16/15 0858 05/17/15 0754  NA 138 134*  K 4.4 4.9  CL 99* 96*  CO2 21* 23  GLUCOSE 86 151*  BUN 40* 54*  CREATININE 11.09* 12.77*  CALCIUM 9.7 9.0  PHOS  --  9.7*   Liver Function Tests:  Recent Labs Lab 05/16/15 0858 05/17/15 0754  AST 13*  --   ALT 10*  --   ALKPHOS 62  --   BILITOT 0.7  --   PROT 7.6  --   ALBUMIN 4.0 3.3*   No results for input(s): LIPASE, AMYLASE in the last 168 hours. No results for input(s): AMMONIA in the last 168 hours. CBC:  Recent Labs Lab 05/16/15 0858 05/17/15 0754  WBC 6.8 5.4  NEUTROABS  --  3.8  HGB 13.6 11.6*  HCT 42.5 36.1*  MCV 81.4 80.8  PLT 100* 104*   Cardiac Enzymes:  Recent Labs Lab 05/16/15 1432 05/16/15 1825 05/17/15 0035  TROPONINI 0.03 0.03 0.03   BNP: Invalid input(s): POCBNP CBG:  Recent Labs Lab 05/16/15 1357 05/16/15 1716 05/16/15 2217 05/17/15 1255  GLUCAP 75 102* 151* 127*    Recent Results (from the past 240 hour(s))  MRSA PCR Screening     Status: None  Collection Time: 05/16/15  2:28 PM  Result Value Ref Range Status   MRSA by PCR NEGATIVE NEGATIVE Final    Comment:        The GeneXpert MRSA Assay (FDA approved for NASAL specimens only), is one component of a comprehensive MRSA colonization surveillance program. It is not intended to diagnose MRSA infection nor to guide or monitor treatment for MRSA infections.      Scheduled Meds: . amitriptyline  100 mg Oral QHS  . amLODipine  10 mg Oral Daily  . aspirin  325 mg Oral Daily  . atorvastatin  20 mg Oral QHS  . carvedilol  50 mg Oral BID WC  . cinacalcet  90 mg Oral Q supper  . clopidogrel  75 mg Oral Daily  . doxercalciferol      .  doxercalciferol  2 mcg Intravenous Q T,Th,Sa-HD  . feeding supplement (NEPRO CARB STEADY)  237 mL Oral BID BM  . finasteride  5 mg Oral Daily  . insulin aspart  0-9 Units Subcutaneous TID WC  . multivitamin  1 tablet Oral QHS  . nitroGLYCERIN  1 inch Topical 4 times per day  . pantoprazole  40 mg Oral Daily  . pregabalin  50 mg Oral Daily  . ranolazine  500 mg Oral BID  . sevelamer carbonate  2,400 mg Oral TID WC  . venlafaxine  37.5 mg Oral BID   Continuous Infusions: . sodium chloride       Layson Bertsch, DO  [facility] Pager [phone]  If 7PM-7AM, please contact night-coverage [email or web address] Password [email or web address] 05/17/2015, 1:50 PM

## 2015-05-18 ENCOUNTER — Ambulatory Visit (HOSPITAL_COMMUNITY): Payer: Medicare Other

## 2015-05-18 DIAGNOSIS — N186 End stage renal disease: Secondary | ICD-10-CM

## 2015-05-18 DIAGNOSIS — D696 Thrombocytopenia, unspecified: Secondary | ICD-10-CM

## 2015-05-18 DIAGNOSIS — I519 Heart disease, unspecified: Secondary | ICD-10-CM | POA: Diagnosis not present

## 2015-05-18 DIAGNOSIS — I2510 Atherosclerotic heart disease of native coronary artery without angina pectoris: Secondary | ICD-10-CM | POA: Diagnosis not present

## 2015-05-18 DIAGNOSIS — E119 Type 2 diabetes mellitus without complications: Secondary | ICD-10-CM | POA: Diagnosis not present

## 2015-05-18 DIAGNOSIS — R0789 Other chest pain: Secondary | ICD-10-CM

## 2015-05-18 DIAGNOSIS — R079 Chest pain, unspecified: Secondary | ICD-10-CM

## 2015-05-18 DIAGNOSIS — I25810 Atherosclerosis of coronary artery bypass graft(s) without angina pectoris: Secondary | ICD-10-CM | POA: Diagnosis not present

## 2015-05-18 DIAGNOSIS — Z9861 Coronary angioplasty status: Secondary | ICD-10-CM

## 2015-05-18 DIAGNOSIS — I10 Essential (primary) hypertension: Secondary | ICD-10-CM

## 2015-05-18 DIAGNOSIS — Z794 Long term (current) use of insulin: Secondary | ICD-10-CM

## 2015-05-18 DIAGNOSIS — R634 Abnormal weight loss: Secondary | ICD-10-CM

## 2015-05-18 LAB — BASIC METABOLIC PANEL
Anion gap: 13 (ref 5–15)
BUN: 36 mg/dL — ABNORMAL HIGH (ref 6–20)
CHLORIDE: 94 mmol/L — AB (ref 101–111)
CO2: 28 mmol/L (ref 22–32)
Calcium: 8.6 mg/dL — ABNORMAL LOW (ref 8.9–10.3)
Creatinine, Ser: 9.34 mg/dL — ABNORMAL HIGH (ref 0.61–1.24)
GFR calc Af Amer: 6 mL/min — ABNORMAL LOW (ref >60)
GFR calc non Af Amer: 5 mL/min — ABNORMAL LOW (ref >60)
GLUCOSE: 112 mg/dL — AB (ref 65–99)
POTASSIUM: 4.1 mmol/L (ref 3.5–5.1)
Sodium: 135 mmol/L (ref 135–145)

## 2015-05-18 LAB — GLUCOSE, CAPILLARY
GLUCOSE-CAPILLARY: 86 mg/dL (ref 65–99)
GLUCOSE-CAPILLARY: 98 mg/dL (ref 65–99)
Glucose-Capillary: 122 mg/dL — ABNORMAL HIGH (ref 65–99)
Glucose-Capillary: 125 mg/dL — ABNORMAL HIGH (ref 65–99)

## 2015-05-18 LAB — CBC
HCT: 38.9 % — ABNORMAL LOW (ref 39.0–52.0)
Hemoglobin: 12.3 g/dL — ABNORMAL LOW (ref 13.0–17.0)
MCH: 25.9 pg — ABNORMAL LOW (ref 26.0–34.0)
MCHC: 31.6 g/dL (ref 30.0–36.0)
MCV: 82.1 fL (ref 78.0–100.0)
PLATELETS: 122 10*3/uL — AB (ref 150–400)
RBC: 4.74 MIL/uL (ref 4.22–5.81)
RDW: 18.7 % — ABNORMAL HIGH (ref 11.5–15.5)
WBC: 5 10*3/uL (ref 4.0–10.5)

## 2015-05-18 LAB — HEPARIN INDUCED PLATELET AB (HIT ANTIBODY): Heparin Induced Plt Ab: 0.274 OD (ref 0.000–0.400)

## 2015-05-18 MED ORDER — POLYETHYLENE GLYCOL 3350 17 G PO PACK
17.0000 g | PACK | Freq: Every day | ORAL | Status: DC
  Administered 2015-05-18 – 2015-05-20 (×2): 17 g via ORAL
  Filled 2015-05-18 (×3): qty 1

## 2015-05-18 MED ORDER — RENA-VITE PO TABS
1.0000 | ORAL_TABLET | Freq: Every day | ORAL | Status: DC
  Administered 2015-05-18 – 2015-05-20 (×2): 1 via ORAL
  Filled 2015-05-18 (×2): qty 1

## 2015-05-18 NOTE — Consult Note (Signed)
Reason for Consult:   Chest pain  Requesting Physician: [facility] Primary Cardiologist Dr Johnsie Cancel  HPI: This is a 59 y.o. male with a past medical history significant for CAD (s/p BMS to mid LAD 12/2009 and DES to mid LAD 10/2010 per [facility]), HTN, DM, depression, anemia, h/o hematochezia/hematuria in [id number], chronic diastolic CHF, and ESRD on HD who presented to [facility] with chest pain 05/16/15. We had seen him recently in Feb 2016. Cath then showed patent coronaries. He was admitted again after this in Feb for chest pain- echo showed abn EF of 55% with mild MR and grade 1 DD.   He is admitted now with recurrent chest pain. He says he has chest pain "all the time". His symptoms come and go and may last up to an hour and a half. NTG does not help. His symptoms are not exertional. His Troponin is negative x 3 and his EKG is unchanged.   PMHx:  Past Medical History  Diagnosis Date  . Hypertension   . ESRD on dialysis   . Diabetes mellitus with nephropathy   . Hematochezia     a. 2014: colonscopy, which showed moderately-sized internal hemorrhoids, two 42mm polyps in transverse colon and ascending colon that were resected, five 2-63mm polyps in sigmoid colon, descending colon, transverse colon, and ascending colon that were resected. An upper endoscopy was performed and showed normal esophagus, stomach, and duodenum.  . Hematuria     a. H/o hematuria 2014 with cystoscopy that was unrevealing for his source of hematuria. He underwent a kidney ultrasound on 10/14 that showed mildly echogenic and scarred kidneys compatible with medical renal disease, without hydronephrosis or renal calculi.  [name] Anemia   . Depression   . CAD (coronary artery disease)     a. per [facility] s/p BMS to mid LAD 12/2009 and DES to mid LAD 10/2010.  . Colon polyps   . Chronic diastolic CHF (congestive heart failure)     Past Surgical History  Procedure Laterality Date  . Left  heart catheterization with coronary angiogram N/A 11/23/2014    Procedure: LEFT HEART CATHETERIZATION WITH CORONARY ANGIOGRAM;  Surgeon: Troy Sine, MD;  Location: [facility] CATH LAB;  Service: Cardiovascular;  Laterality: N/A;    SOCHx:  reports that he quit smoking about 4 years ago. He has never used smokeless tobacco. He reports that he does not drink alcohol or use illicit drugs.  FAMHx: Family History  Problem Relation Age of Onset  . Hypertension    . Bone cancer Mother   . Anuerysm Father   . Diabetes type II Daughter     ALLERGIES: Allergies  Allergen Reactions  . Enalapril Hives    ROS: Pertinent items are noted in HPI. See H&P for complete ROS  HOME MEDICATIONS: Prior to Admission medications   Medication Sig Start Date End Date Taking? Authorizing Provider  acetaminophen (TYLENOL) 500 MG tablet Take 500 mg by mouth every 6 (six) hours as needed for headache.    Yes Historical Provider, MD  amitriptyline (ELAVIL) 100 MG tablet Take 1 tablet (100 mg total) by mouth at bedtime. 12/15/14  Yes Jessica U Vann, DO  amLODipine (NORVASC) 10 MG tablet Take 10 mg by mouth daily.   Yes Historical Provider, MD  aspirin 325 MG tablet Take 1 tablet (325 mg total) by mouth daily. 04/03/15  Yes Orson Eva, MD  atorvastatin (LIPITOR) 20 MG tablet Take 20 mg by  mouth at bedtime.    Yes Historical Provider, MD  carvedilol (COREG) 25 MG tablet Take 50 mg by mouth 2 (two) times daily.    Yes Historical Provider, MD  cinacalcet (SENSIPAR) 90 MG tablet Take 90 mg by mouth at bedtime.    Yes Historical Provider, MD  clopidogrel (PLAVIX) 75 MG tablet Take 1 tablet (75 mg total) by mouth daily. 11/24/14  Yes Marjan Rabbani, MD  finasteride (PROSCAR) 5 MG tablet Take 5 mg by mouth daily.   Yes Historical Provider, MD  insulin glargine (LANTUS) 100 UNIT/ML injection Inject 10 Units into the skin at bedtime.   Yes Historical Provider, MD  multivitamin (RENA-VIT) TABS tablet Take 1 tablet by mouth at  bedtime. 12/15/14  Yes Geradine Girt, DO  pregabalin (LYRICA) 50 MG capsule Take 1 capsule (50 mg total) by mouth daily. 12/15/14  Yes Geradine Girt, DO  ranolazine (RANEXA) 500 MG 12 hr tablet Take 1 tablet (500 mg total) by mouth 2 (two) times daily. 11/24/14  Yes Juluis Mire, MD  sevelamer carbonate (RENVELA) 800 MG tablet Take 2,400 mg by mouth 3 (three) times daily with meals.    Yes Historical Provider, MD  sucralfate (CARAFATE) 1 GM/10ML suspension Take 10 mLs (1 g total) by mouth 4 (four) times daily -  with meals and at bedtime. Patient taking differently: Take 1 g by mouth at bedtime.  12/15/14  Yes Geradine Girt, DO  venlafaxine (EFFEXOR) 37.5 MG tablet Take 37.5 mg by mouth 2 (two) times daily.   Yes Historical Provider, MD  calcium acetate (PHOSLO) 667 MG capsule Take 2 capsules (1,334 mg total) by mouth 3 (three) times daily with meals. Patient not taking: Reported on 05/16/2015 11/10/14   Juliet Rude, MD  Darbepoetin Alfa (ARANESP) 25 MCG/0.42ML SOSY injection Inject 0.42 mLs (25 mcg total) into the vein every Tuesday with hemodialysis. 12/15/14   Geradine Girt, DO  doxercalciferol (HECTOROL) 4 MCG/2ML injection Inject 1 mL (2 mcg total) into the vein Every Tuesday,Thursday,and Saturday with dialysis. 12/15/14   Geradine Girt, DO  ferric gluconate 62.5 mg in sodium chloride 0.9 % 100 mL Inject 62.5 mg into the vein every Thursday with hemodialysis. 12/16/14   Geradine Girt, DO  insulin aspart (NOVOLOG) 100 UNIT/ML injection Inject 15-20 Units into the skin 3 (three) times daily as needed for high blood sugar (CBG >150).     Historical Provider, MD  Ipratropium-Albuterol (COMBIVENT RESPIMAT) 20-100 MCG/ACT AERS respimat Inhale 2 puffs into the lungs every 6 (six) hours as needed for wheezing.    Historical Provider, MD  meclizine (ANTIVERT) 25 MG tablet Take 25 mg by mouth 3 (three) times daily as needed for dizziness.    Historical Provider, MD  ondansetron (ZOFRAN) 4 MG tablet Take 1 tablet  (4 mg total) by mouth every 6 (six) hours. Patient not taking: Reported on 05/16/2015 12/02/14   Britt Bottom, NP  pantoprazole (PROTONIX) 40 MG tablet Take 1 tablet (40 mg total) by mouth daily. Patient not taking: Reported on 05/16/2015 11/24/14   Juluis Mire, MD    HOSPITAL MEDICATIONS: I have reviewed the patient's current medications.  VITALS: Blood pressure 137/82, pulse 75, temperature 97.8 F (36.6 C), temperature source Oral, resp. rate 17, height 6' (1.829 m), weight 196 lb 6.9 oz (89.1 kg), SpO2 99 %.  PHYSICAL EXAM: General appearance: alert, cooperative and no distress Neck: no carotid bruit and no JVD Lungs: clear to auscultation bilaterally Heart: regular rate and  rhythm and 2/6 systolic murmur Abdomen: soft, non-tender; bowel sounds normal; no masses,  no organomegaly Extremities: extremities normal, atraumatic, no cyanosis or edema Pulses: 2+ and symmetric Skin: Skin color, texture, turgor normal. No rashes or lesions Neurologic: Grossly normal  LABS: Results for orders placed or performed during the hospital encounter of 05/16/15 (from the past 24 hour(s))  Vitamin B12     Status: None   Collection Time: 05/17/15  5:00 PM  Result Value Ref Range   Vitamin B-12 359 180 - 914 pg/mL  Heparin induced platelet Ab (HIT antibody)     Status: None   Collection Time: 05/17/15  5:00 PM  Result Value Ref Range   Heparin Induced Plt Ab 0.274 0.000 - 0.400 OD  Glucose, capillary     Status: Abnormal   Collection Time: 05/17/15  6:38 PM  Result Value Ref Range   Glucose-Capillary 104 (H) 65 - 99 mg/dL  Glucose, capillary     Status: Abnormal   Collection Time: 05/17/15  9:37 PM  Result Value Ref Range   Glucose-Capillary 149 (H) 65 - 99 mg/dL  CBC     Status: Abnormal   Collection Time: 05/18/15  4:52 AM  Result Value Ref Range   WBC 5.0 4.0 - 10.5 K/uL   RBC 4.74 4.22 - 5.81 MIL/uL   Hemoglobin 12.3 (L) 13.0 - 17.0 g/dL   HCT 38.9 (L) 39.0 - 52.0 %   MCV 82.1  78.0 - 100.0 fL   MCH 25.9 (L) 26.0 - 34.0 pg   MCHC 31.6 30.0 - 36.0 g/dL   RDW 18.7 (H) 11.5 - 15.5 %   Platelets 122 (L) 150 - 400 K/uL  Basic metabolic panel     Status: Abnormal   Collection Time: 05/18/15  4:52 AM  Result Value Ref Range   Sodium 135 135 - 145 mmol/L   Potassium 4.1 3.5 - 5.1 mmol/L   Chloride 94 (L) 101 - 111 mmol/L   CO2 28 22 - 32 mmol/L   Glucose, Bld 112 (H) 65 - 99 mg/dL   BUN 36 (H) 6 - 20 mg/dL   Creatinine, Ser 9.34 (H) 0.61 - 1.24 mg/dL   Calcium 8.6 (L) 8.9 - 10.3 mg/dL   GFR calc non Af Amer 5 (L) >60 mL/min   GFR calc Af Amer 6 (L) >60 mL/min   Anion gap 13 5 - 15  Glucose, capillary     Status: None   Collection Time: 05/18/15  7:33 AM  Result Value Ref Range   Glucose-Capillary 86 65 - 99 mg/dL  Glucose, capillary     Status: Abnormal   Collection Time: 05/18/15 11:18 AM  Result Value Ref Range   Glucose-Capillary 122 (H) 65 - 99 mg/dL    EKG: NSR, LVH, LAE  Echo: 05/18/15 Study Conclusions  - Left ventricle: The cavity size was mildly dilated. Wall thickness was normal. Systolic function was mildly reduced. The estimated ejection fraction was in the range of 45% to 50%. Mild diffuse hypokinesis with no identifiable regional variations. Features are consistent with a pseudonormal left ventricular filling pattern, with concomitant abnormal relaxation and increased filling pressure (grade 2 diastolic dysfunction). - Aortic valve: There was trivial regurgitation. - Mitral valve: Moderately calcified annulus. There was moderate regurgitation directed centrally. Valve area by pressure half-time: 2.44 cm^2. Valve area by continuity equation (using LVOT flow): 1.39 cm^2. - Left atrium: The atrium was severely dilated. - Right ventricle: The cavity size was moderately dilated. Systolic function  was mildly reduced. - Right atrium: The atrium was mildly dilated.  Impressions:  - Direct image comparison with the study  from February 2016 confirms a slight reduction in LV systolic function and worsening secondary mitral insufficiency, as well as elevation in mean left atrial pressure.   IMAGING: Ct Angio Chest Pe W/cm &/or Wo Cm  05/17/2015   CLINICAL DATA:  Chest pain, shortness of breath.  EXAM: CT ANGIOGRAPHY CHEST WITH CONTRAST  TECHNIQUE: Multidetector CT imaging of the chest was performed using the standard protocol during bolus administration of intravenous contrast. Multiplanar CT image reconstructions and MIPs were obtained to evaluate the vascular anatomy.  CONTRAST:  19mL OMNIPAQUE IOHEXOL 350 MG/ML SOLN  COMPARISON:  CT scan of May 16, 2015.  FINDINGS: No pneumothorax or pleural effusion is noted. No acute pulmonary disease is noted. No definite evidence of pulmonary embolus is noted. Atherosclerosis of thoracic aorta is noted without aneurysm formation. Visualized portion of upper abdomen is unremarkable. No mediastinal mass or adenopathy is noted. No significant osseous abnormality is noted.  Review of the MIP images confirms the above findings.  IMPRESSION: No definite evidence of pulmonary embolus. No acute cardiopulmonary abnormality seen.   Electronically Signed   By: Marijo Conception, M.D.   On: 05/17/2015 17:54    IMPRESSION: Principal Problem:   Chest pain Active Problems:   CAD -S/P LAD BMS 2011, LAD DES 2012- patent cors Feb 2016   ESRD (end stage renal disease)   Hypertension   Diabetes mellitus type 2, insulin dependent   Diastolic dysfunction-grade 2   Hypertensive urgency   H/O TIA (transient ischemic attack)   Thrombocytopenia   Weight loss   Malnutrition of moderate degree   RECOMMENDATION: Will review with MD.  He has records of cardiac testing going back 10 yrs. He had a low risk Myoview in July 2015- will repeat that tomorrow. It could be his symptoms are from volume overload and diastolic dysfunction.   Time Spent Directly with Patient: 12 minutes  Erlene Quan [id number] beeper 05/18/2015, 4:51 PM   Agree with note written by Kerin Ransom [facility]  H/O non critical CAD as recently as Feb 2016. H/O HD. Neg MV 7/15. CP is constant and non cardiac. EKG without acute changes. Enz neg. 2D shows slightly lower EF  (45-50%) with 2+ MR. Exam benign w/o rub. Will repeat Lexiscan  MV tomorrow AM.  Quay Burow 05/18/2015 5:23 PM

## 2015-05-18 NOTE — Progress Notes (Signed)
[facility] PROGRESS NOTE  [name] [name] [id number] DOB: May 08, 1956 DOA: 05/16/2015 PCP: [name]  Assessment/Plan: 1. Chest pain- patient continues to have 3/10 chest pain, cardiac cath on 216 showed no significant obstructive disease, troponin is negative so far. Will consult cardiology for optimizing the medical management. 2. Hypertensive urgency- continue amlodipine, Coreg, hydralazine when necessary 3. ESRD- continue hemodialysis Tuesday Thursday and Saturday. Nephrology consulted. 4. Diabetes mellitus-  continue sliding scale insulin with NovoLog. 5. Thrombocytopenia- platelets today 122. We'll follow CBC in a.m. 6. History of significant weight loss- patient has about 48 pound weight loss over the past 1 year, CT abdomen and pelvis done in May for abdominal pain was unremarkable for any mass. CT of the chest done without contrast on admission showing heavy coronary calcification with atherosclerotic disease. No mass or lung nodule seen. Patient will need GI evaluation for colonoscopy as outpatient.  Code Status: Full code Family Communication: No family at bedside Disposition Plan: Home when medically stable   Consultants:  Nephrology  Cardiology  Procedures:  None  Antibiotics:  None  HPI/Subjective: 59 y.o. male with HTN, ESRD on dialysis Tues/Thurs/Sat, Type 2 Diabetes Mellitus, and CAD who presented to the ED for sharp chest pain that began at 6:00 am. The pain did not wake him out of sleep. It worsened while he was walking to the store and was accompanied with shortness of breath. The pain is sharp, constant, 6/10 with some radiation to his right elbow and occasional heart palpitations. Pain is better when laying down and worse with sitting forward and walking. He believed it was due to an elevated potassium, as he was told on Saturday at dialysis his potassium was high. He had two episodes of vomiting with no blood or abdominal pain. He also reports  bilateral leg weakness that worsened as he was walking. Patient has been compliant with dialysis treatments and has not noticed any edema or weight gain. Per patient, he has lost approximately 70lbs over the past year and has had a suppressed appetite with vomiting after eating. Cardiac cath 11/23/14 demonstrated no significant coronary obstructive disease.  Patient still has 3/10 intensity chest pain.  Objective: Filed Vitals:   05/18/15 0755  BP: 137/82  Pulse: 75  Temp: 97.8 F (36.6 C)  Resp: 17    Intake/Output Summary (Last 24 hours) at 05/18/15 1516 Last data filed at 05/18/15 1259  Gross per 24 hour  Intake   1182 ml  Output    100 ml  Net   1082 ml   Filed Weights   05/17/15 0730 05/17/15 1127 05/18/15 0538  Weight: 91.8 kg (202 lb 6.1 oz) 88.7 kg (195 lb 8.8 oz) 89.1 kg (196 lb 6.9 oz)    Exam:   General:  Appear in no acute distress  Cardiovascular: S1S2 RRR  Respiratory: Clear bilaterally  Abdomen: Soft, nontender  Musculoskeletal: No edema of the lower extremities   Data Reviewed: Basic Metabolic Panel:  Recent Labs Lab 05/16/15 0858 05/17/15 0754 05/18/15 0452  NA 138 134* 135  K 4.4 4.9 4.1  CL 99* 96* 94*  CO2 21* 23 28  GLUCOSE 86 151* 112*  BUN 40* 54* 36*  CREATININE 11.09* 12.77* 9.34*  CALCIUM 9.7 9.0 8.6*  PHOS  --  9.7*  --    Liver Function Tests:  Recent Labs Lab 05/16/15 0858 05/17/15 0754  AST 13*  --   ALT 10*  --   ALKPHOS 62  --   BILITOT  0.7  --   PROT 7.6  --   ALBUMIN 4.0 3.3*   No results for input(s): LIPASE, AMYLASE in the last 168 hours. No results for input(s): AMMONIA in the last 168 hours. CBC:  Recent Labs Lab 05/16/15 0858 05/17/15 0754 05/18/15 0452  WBC 6.8 5.4 5.0  NEUTROABS  --  3.8  --   HGB 13.6 11.6* 12.3*  HCT 42.5 36.1* 38.9*  MCV 81.4 80.8 82.1  PLT 100* 104* 122*   Cardiac Enzymes:  Recent Labs Lab 05/16/15 1432 05/16/15 1825 05/17/15 0035  TROPONINI 0.03 0.03 0.03   BNP  (last 3 results)  Recent Labs  11/20/14 2245 12/06/14 0845 12/12/14 2100  BNP 75.6 86.9 816.3*    ProBNP (last 3 results) No results for input(s): PROBNP in the last 8760 hours.  CBG:  Recent Labs Lab 05/17/15 1255 05/17/15 1838 05/17/15 2137 05/18/15 0733 05/18/15 1118  GLUCAP 127* 104* 149* 86 122*    Recent Results (from the past 240 hour(s))  MRSA PCR Screening     Status: None   Collection Time: 05/16/15  2:28 PM  Result Value Ref Range Status   MRSA by PCR NEGATIVE NEGATIVE Final    Comment:        The GeneXpert MRSA Assay (FDA approved for NASAL specimens only), is one component of a comprehensive MRSA colonization surveillance program. It is not intended to diagnose MRSA infection nor to guide or monitor treatment for MRSA infections.      Studies: Ct Angio Chest Pe W/cm &/or Wo Cm  05/17/2015   CLINICAL DATA:  Chest pain, shortness of breath.  EXAM: CT ANGIOGRAPHY CHEST WITH CONTRAST  TECHNIQUE: Multidetector CT imaging of the chest was performed using the standard protocol during bolus administration of intravenous contrast. Multiplanar CT image reconstructions and MIPs were obtained to evaluate the vascular anatomy.  CONTRAST:  184mL OMNIPAQUE IOHEXOL 350 MG/ML SOLN  COMPARISON:  CT scan of May 16, 2015.  FINDINGS: No pneumothorax or pleural effusion is noted. No acute pulmonary disease is noted. No definite evidence of pulmonary embolus is noted. Atherosclerosis of thoracic aorta is noted without aneurysm formation. Visualized portion of upper abdomen is unremarkable. No mediastinal mass or adenopathy is noted. No significant osseous abnormality is noted.  Review of the MIP images confirms the above findings.  IMPRESSION: No definite evidence of pulmonary embolus. No acute cardiopulmonary abnormality seen.   Electronically Signed   By: Marijo Conception, M.D.   On: 05/17/2015 17:54    Scheduled Meds: . amitriptyline  100 mg Oral QHS  . amLODipine  10 mg  Oral Daily  . aspirin  325 mg Oral Daily  . atorvastatin  20 mg Oral QHS  . carvedilol  50 mg Oral BID WC  . cinacalcet  90 mg Oral Q supper  . clopidogrel  75 mg Oral Daily  . doxercalciferol  2 mcg Intravenous Q T,Th,Sa-HD  . feeding supplement (NEPRO CARB STEADY)  237 mL Oral BID BM  . finasteride  5 mg Oral Daily  . insulin aspart  0-9 Units Subcutaneous TID WC  . multivitamin  1 tablet Oral QHS  . multivitamin  1 tablet Oral QHS  . nitroGLYCERIN  1 inch Topical 4 times per day  . pantoprazole  40 mg Oral Daily  . polyethylene glycol  17 g Oral Daily  . pregabalin  50 mg Oral Daily  . ranolazine  500 mg Oral BID  . sevelamer carbonate  2,400 mg Oral  TID WC  . venlafaxine  37.5 mg Oral BID   Continuous Infusions: . sodium chloride      Principal Problem:   Chest pain Active Problems:   ESRD (end stage renal disease)   Hypertension   CAD (coronary artery disease) of artery bypass graft   Diabetes mellitus type 2, insulin dependent   Thrombocytopenia   Hypertensive urgency   Weight loss   Angina of effort   Malnutrition of moderate degree    Time spent: *25 min    [facility]  [facility] Pager [phone]*. If 7PM-7AM, please contact night-coverage at [email or web address], password [facility] 05/18/2015, 3:16 PM

## 2015-05-18 NOTE — Progress Notes (Signed)
Subjective:   Had chest pain overnight- resolved with nitro and morphine. Denies sob.   Objective Filed Vitals:   05/17/15 1821 05/17/15 2037 05/18/15 0538 05/18/15 0755  BP: 159/82 122/69 129/77 137/82  Pulse: 96 93 77 75  Temp: 98 F (36.7 C) 98.2 F (36.8 C) 98.2 F (36.8 C) 97.8 F (36.6 C)  TempSrc: Oral Oral Oral Oral  Resp: 20 20 18 17   Height:      Weight:   89.1 kg (196 lb 6.9 oz)   SpO2: 98% 91% 100% 99%   Physical Exam General: alert and oriented. No acute distress Heart: RRR Lungs: CTA, unlabored Abdomen: soft, nontender +BS  Extremities: no LE edema Dialysis Access:  L AVF +b/t  TTS AF 4h 500800 88kg 2/2.25 bath Heparin 8000 LUA AVF Venofer 100 x 5 thru 8/11 + Mircera 100 q 2 , last 7/26 on hold Last ferr 826, pth 410, tsat 25%  Assessment/Plan: 1. Chest pain- per primary. CT angio- no PE/ CT negative. Trop negative 2. ESRD - TTS AF. Next HD tomorrow 3. Anemia - hgb 12.3, hold now and watch CBC 4. Secondary hyperparathyroidism - phos 9.7 sensipar, hectorol, renvela 5. HTN/volume - 137/82. Norvasc, coreg, hydralazine. Close to edw 6. Nutrition - renal diet. Alb 3.3. vitamin 7. DM- per primary  Shelle Iron, NP [facility] [phone] 05/18/2015,9:16 AM   Pt seen, examined and agree w A/P as above.  Kelly Splinter MD pager [phone]    cell [phone] 05/18/2015, 2:36 PM     Additional Objective Labs: Basic Metabolic Panel:  Recent Labs Lab 05/16/15 0858 05/17/15 0754 05/18/15 0452  NA 138 134* 135  K 4.4 4.9 4.1  CL 99* 96* 94*  CO2 21* 23 28  GLUCOSE 86 151* 112*  BUN 40* 54* 36*  CREATININE 11.09* 12.77* 9.34*  CALCIUM 9.7 9.0 8.6*  PHOS  --  9.7*  --    Liver Function Tests:  Recent Labs Lab 05/16/15 0858 05/17/15 0754  AST 13*  --   ALT 10*  --   ALKPHOS 62  --   BILITOT 0.7  --   PROT 7.6  --   ALBUMIN 4.0 3.3*   No results for input(s): LIPASE, AMYLASE in the last 168 hours. CBC:  Recent  Labs Lab 05/16/15 0858 05/17/15 0754 05/18/15 0452  WBC 6.8 5.4 5.0  NEUTROABS  --  3.8  --   HGB 13.6 11.6* 12.3*  HCT 42.5 36.1* 38.9*  MCV 81.4 80.8 82.1  PLT 100* 104* 122*   Blood Culture No results found for: SDES, SPECREQUEST, CULT, REPTSTATUS  Cardiac Enzymes:  Recent Labs Lab 05/16/15 1432 05/16/15 1825 05/17/15 0035  TROPONINI 0.03 0.03 0.03   CBG:  Recent Labs Lab 05/16/15 2217 05/17/15 1255 05/17/15 1838 05/17/15 2137 05/18/15 0733  GLUCAP 151* 127* 104* 149* 86   Iron Studies: No results for input(s): IRON, TIBC, TRANSFERRIN, FERRITIN in the last 72 hours. @lablastinr3 @ Studies/Results: Ct Chest Wo Contrast  05/16/2015   CLINICAL DATA:  Chest pain and recent weight loss  EXAM: CT CHEST WITHOUT CONTRAST  TECHNIQUE: Multidetector CT imaging of the chest was performed following the standard protocol without IV contrast.  COMPARISON:  Chest x-ray from earlier in the same day  FINDINGS: The lungs are well aerated bilaterally. The focal infiltrate or sizable effusion is seen. No parenchymal nodules are noted.  The thoracic inlet is within normal limits. Aortic atherosclerotic calcifications are seen without aneurysmal dilatation. Heavy coronary calcifications are  noted. No significant hilar or mediastinal adenopathy is noted. A few small lymph nodes are noted in the pretracheal region but demonstrate normal fatty hila.  The upper abdomen shows no acute abnormality. No acute bony abnormality is noted.  IMPRESSION: Aortic atherosclerotic disease and heavy coronary calcifications.  No acute abnormality is identified.   Electronically Signed   By: Inez Catalina M.D.   On: 05/16/2015 13:26   Ct Angio Chest Pe W/cm &/or Wo Cm  05/17/2015   CLINICAL DATA:  Chest pain, shortness of breath.  EXAM: CT ANGIOGRAPHY CHEST WITH CONTRAST  TECHNIQUE: Multidetector CT imaging of the chest was performed using the standard protocol during bolus administration of intravenous contrast.  Multiplanar CT image reconstructions and MIPs were obtained to evaluate the vascular anatomy.  CONTRAST:  161mL OMNIPAQUE IOHEXOL 350 MG/ML SOLN  COMPARISON:  CT scan of May 16, 2015.  FINDINGS: No pneumothorax or pleural effusion is noted. No acute pulmonary disease is noted. No definite evidence of pulmonary embolus is noted. Atherosclerosis of thoracic aorta is noted without aneurysm formation. Visualized portion of upper abdomen is unremarkable. No mediastinal mass or adenopathy is noted. No significant osseous abnormality is noted.  Review of the MIP images confirms the above findings.  IMPRESSION: No definite evidence of pulmonary embolus. No acute cardiopulmonary abnormality seen.   Electronically Signed   By: Marijo Conception, M.D.   On: 05/17/2015 17:54   Medications: . sodium chloride     . amitriptyline  100 mg Oral QHS  . amLODipine  10 mg Oral Daily  . aspirin  325 mg Oral Daily  . atorvastatin  20 mg Oral QHS  . carvedilol  50 mg Oral BID WC  . cinacalcet  90 mg Oral Q supper  . clopidogrel  75 mg Oral Daily  . doxercalciferol  2 mcg Intravenous Q T,Th,Sa-HD  . feeding supplement (NEPRO CARB STEADY)  237 mL Oral BID BM  . finasteride  5 mg Oral Daily  . insulin aspart  0-9 Units Subcutaneous TID WC  . multivitamin  1 tablet Oral QHS  . nitroGLYCERIN  1 inch Topical 4 times per day  . pantoprazole  40 mg Oral Daily  . pregabalin  50 mg Oral Daily  . ranolazine  500 mg Oral BID  . sevelamer carbonate  2,400 mg Oral TID WC  . venlafaxine  37.5 mg Oral BID

## 2015-05-18 NOTE — Progress Notes (Signed)
  Echocardiogram 2D Echocardiogram has been performed.  Tresa Res 05/18/2015, 11:08 AM

## 2015-05-19 ENCOUNTER — Observation Stay (HOSPITAL_COMMUNITY): Payer: Medicare Other

## 2015-05-19 DIAGNOSIS — E119 Type 2 diabetes mellitus without complications: Secondary | ICD-10-CM | POA: Diagnosis not present

## 2015-05-19 DIAGNOSIS — R079 Chest pain, unspecified: Secondary | ICD-10-CM | POA: Diagnosis not present

## 2015-05-19 DIAGNOSIS — I2510 Atherosclerotic heart disease of native coronary artery without angina pectoris: Secondary | ICD-10-CM | POA: Diagnosis not present

## 2015-05-19 DIAGNOSIS — Z794 Long term (current) use of insulin: Secondary | ICD-10-CM | POA: Diagnosis not present

## 2015-05-19 DIAGNOSIS — Z9861 Coronary angioplasty status: Secondary | ICD-10-CM | POA: Diagnosis not present

## 2015-05-19 DIAGNOSIS — N186 End stage renal disease: Secondary | ICD-10-CM | POA: Diagnosis not present

## 2015-05-19 LAB — GLUCOSE, CAPILLARY
GLUCOSE-CAPILLARY: 145 mg/dL — AB (ref 65–99)
Glucose-Capillary: 100 mg/dL — ABNORMAL HIGH (ref 65–99)
Glucose-Capillary: 114 mg/dL — ABNORMAL HIGH (ref 65–99)
Glucose-Capillary: 100 mg/dL — ABNORMAL HIGH (ref 65–99)

## 2015-05-19 LAB — COMPREHENSIVE METABOLIC PANEL
ALT: 9 U/L — ABNORMAL LOW (ref 17–63)
AST: 8 U/L — AB (ref 15–41)
Albumin: 3.6 g/dL (ref 3.5–5.0)
Alkaline Phosphatase: 55 U/L (ref 38–126)
Anion gap: 17 — ABNORMAL HIGH (ref 5–15)
BILIRUBIN TOTAL: 0.5 mg/dL (ref 0.3–1.2)
BUN: 50 mg/dL — ABNORMAL HIGH (ref 6–20)
CHLORIDE: 90 mmol/L — AB (ref 101–111)
CO2: 29 mmol/L (ref 22–32)
Calcium: 9 mg/dL (ref 8.9–10.3)
Creatinine, Ser: 11.71 mg/dL — ABNORMAL HIGH (ref 0.61–1.24)
GFR calc Af Amer: 5 mL/min — ABNORMAL LOW (ref >60)
GFR calc non Af Amer: 4 mL/min — ABNORMAL LOW (ref >60)
Glucose, Bld: 104 mg/dL — ABNORMAL HIGH (ref 65–99)
POTASSIUM: 4.9 mmol/L (ref 3.5–5.1)
Sodium: 136 mmol/L (ref 135–145)
Total Protein: 7.4 g/dL (ref 6.5–8.1)

## 2015-05-19 LAB — NM MYOCAR MULTI W/SPECT W/WALL MOTION / EF
LV dias vol: 202 mL
LV sys vol: 117 mL
Peak HR: 94 {beats}/min
RATE: 0.52
Rest HR: 84 {beats}/min
SDS: 6
SRS: 3
SSS: 6
TID: 1.04

## 2015-05-19 LAB — RENAL FUNCTION PANEL
Albumin: 3.4 g/dL — ABNORMAL LOW (ref 3.5–5.0)
Anion gap: 17 — ABNORMAL HIGH (ref 5–15)
BUN: 55 mg/dL — AB (ref 6–20)
CALCIUM: 8.6 mg/dL — AB (ref 8.9–10.3)
CHLORIDE: 90 mmol/L — AB (ref 101–111)
CO2: 24 mmol/L (ref 22–32)
CREATININE: 12.35 mg/dL — AB (ref 0.61–1.24)
GFR calc Af Amer: 5 mL/min — ABNORMAL LOW (ref >60)
GFR calc non Af Amer: 4 mL/min — ABNORMAL LOW (ref >60)
GLUCOSE: 131 mg/dL — AB (ref 65–99)
Phosphorus: 8.8 mg/dL — ABNORMAL HIGH (ref 2.5–4.6)
Potassium: 4.6 mmol/L (ref 3.5–5.1)
Sodium: 131 mmol/L — ABNORMAL LOW (ref 135–145)

## 2015-05-19 LAB — CBC
HCT: 38.2 % — ABNORMAL LOW (ref 39.0–52.0)
HCT: 35.4 % — ABNORMAL LOW (ref 39.0–52.0)
HEMOGLOBIN: 12.2 g/dL — AB (ref 13.0–17.0)
Hemoglobin: 11.6 g/dL — ABNORMAL LOW (ref 13.0–17.0)
MCH: 26.2 pg (ref 26.0–34.0)
MCH: 26.2 pg (ref 26.0–34.0)
MCHC: 31.9 g/dL (ref 30.0–36.0)
MCHC: 32.8 g/dL (ref 30.0–36.0)
MCV: 82.2 fL (ref 78.0–100.0)
MCV: 79.9 fL (ref 78.0–100.0)
PLATELETS: 139 10*3/uL — AB (ref 150–400)
Platelets: 125 10*3/uL — ABNORMAL LOW (ref 150–400)
RBC: 4.65 MIL/uL (ref 4.22–5.81)
RBC: 4.43 MIL/uL (ref 4.22–5.81)
RDW: 18.7 % — ABNORMAL HIGH (ref 11.5–15.5)
RDW: 18.5 % — ABNORMAL HIGH (ref 11.5–15.5)
WBC: 6.1 10*3/uL (ref 4.0–10.5)
WBC: 7.7 10*3/uL (ref 4.0–10.5)

## 2015-05-19 MED ORDER — HEPARIN SODIUM (PORCINE) 1000 UNIT/ML DIALYSIS: 1000.0000 [IU] | INTRAMUSCULAR | Status: DC | PRN

## 2015-05-19 MED ORDER — REGADENOSON 0.4 MG/5ML IV SOLN
0.4000 mg | Freq: Once | INTRAVENOUS | Status: AC
  Administered 2015-05-19: 0.4 mg via INTRAVENOUS
  Filled 2015-05-19: qty 5

## 2015-05-19 MED ORDER — SODIUM CHLORIDE 0.9 % IV SOLN: 100.0000 mL | INTRAVENOUS | Status: DC | PRN

## 2015-05-19 MED ORDER — TECHNETIUM TC 99M SESTAMIBI GENERIC - CARDIOLITE
30.0000 | Freq: Once | INTRAVENOUS | Status: AC | PRN
  Administered 2015-05-19: 30 via INTRAVENOUS

## 2015-05-19 MED ORDER — LIDOCAINE HCL (PF) 1 % IJ SOLN: 5.0000 mL | INTRAMUSCULAR | Status: DC | PRN

## 2015-05-19 MED ORDER — LIDOCAINE-PRILOCAINE 2.5-2.5 % EX CREA
1.0000 "application " | TOPICAL_CREAM | CUTANEOUS | Status: DC | PRN
  Filled 2015-05-19: qty 5

## 2015-05-19 MED ORDER — DOXERCALCIFEROL 4 MCG/2ML IV SOLN
INTRAVENOUS | Status: AC
  Filled 2015-05-19: qty 2

## 2015-05-19 MED ORDER — ALTEPLASE 2 MG IJ SOLR
2.0000 mg | Freq: Once | INTRAMUSCULAR | Status: DC | PRN
  Filled 2015-05-19: qty 2

## 2015-05-19 MED ORDER — TECHNETIUM TC 99M SESTAMIBI GENERIC - CARDIOLITE
10.0000 | Freq: Once | INTRAVENOUS | Status: AC | PRN
  Administered 2015-05-19: 10 via INTRAVENOUS

## 2015-05-19 MED ORDER — NEPRO/CARBSTEADY PO LIQD
237.0000 mL | ORAL | Status: DC | PRN
  Filled 2015-05-19: qty 237

## 2015-05-19 MED ORDER — HEPARIN SODIUM (PORCINE) 1000 UNIT/ML DIALYSIS
8000.0000 [IU] | Freq: Once | INTRAMUSCULAR | Status: DC
  Filled 2015-05-19: qty 8

## 2015-05-19 MED ORDER — REGADENOSON 0.4 MG/5ML IV SOLN
INTRAVENOUS | Status: AC
  Administered 2015-05-19: 0.4 mg via INTRAVENOUS
  Filled 2015-05-19: qty 5

## 2015-05-19 MED ORDER — PENTAFLUOROPROP-TETRAFLUOROETH EX AERO: 1.0000 "application " | INHALATION_SPRAY | CUTANEOUS | Status: DC | PRN

## 2015-05-19 MED ORDER — MORPHINE SULFATE 2 MG/ML IJ SOLN
INTRAMUSCULAR | Status: AC
  Filled 2015-05-19: qty 1

## 2015-05-19 NOTE — Progress Notes (Signed)
[facility] PROGRESS NOTE  [name] [name] [id number] DOB: 05-Jun-1956 DOA: 05/16/2015 PCP: [name]  Assessment/Plan: 1. Chest pain- patient continues to have 3/10 chest pain, cardiac cath on 216 showed no significant obstructive disease, troponin is negative so far. Cardiology has seen the patient and plan for nuclear stress test today. 2. Hypertensive urgency- continue amlodipine, Coreg, hydralazine when necessary 3. ESRD- continue hemodialysis Tuesday Thursday and Saturday. Nephrology following. 4. Diabetes mellitus-  continue sliding scale insulin with NovoLog. 5. Thrombocytopenia- platelets today 122. We'll follow CBC in a.m. 6. History of significant weight loss- patient has about 48 pound weight loss over the past 1 year, CT abdomen and pelvis done in May for abdominal pain was unremarkable for any mass. CT of the chest done without contrast on admission showing heavy coronary calcification with atherosclerotic disease. No mass or lung nodule seen. Patient will need GI evaluation for colonoscopy as outpatient.  Code Status: Full code Family Communication: No family at bedside Disposition Plan: Home when medically stable   Consultants:  Nephrology  Cardiology  Procedures:  None  Antibiotics:  None  HPI/Subjective: 59 y.o. male with HTN, ESRD on dialysis Tues/Thurs/Sat, Type 2 Diabetes Mellitus, and CAD who presented to the ED for sharp chest pain that began at 6:00 am. The pain did not wake him out of sleep. It worsened while he was walking to the store and was accompanied with shortness of breath. The pain is sharp, constant, 6/10 with some radiation to his right elbow and occasional heart palpitations. Pain is better when laying down and worse with sitting forward and walking. He believed it was due to an elevated potassium, as he was told on Saturday at dialysis his potassium was high. He had two episodes of vomiting with no blood or abdominal pain. He also  reports bilateral leg weakness that worsened as he was walking. Patient has been compliant with dialysis treatments and has not noticed any edema or weight gain. Per patient, he has lost approximately 70lbs over the past year and has had a suppressed appetite with vomiting after eating. Cardiac cath 11/23/14 demonstrated no significant coronary obstructive disease.  Patient to undergo nuclear stress test today, says the pain is 1/10 in intensity.  Objective: Filed Vitals:   05/19/15 1112  BP:   Pulse: 91  Temp:   Resp:     Intake/Output Summary (Last 24 hours) at 05/19/15 1739 Last data filed at 05/19/15 1600  Gross per 24 hour  Intake    940 ml  Output      0 ml  Net    940 ml   Filed Weights   05/17/15 1127 05/18/15 0538 05/18/15 2055  Weight: 88.7 kg (195 lb 8.8 oz) 89.1 kg (196 lb 6.9 oz) 93.441 kg (206 lb)    Exam:   General:  Appear in no acute distress  Cardiovascular: S1S2 RRR  Respiratory: Clear bilaterally  Abdomen: Soft, nontender  Musculoskeletal: No edema of the lower extremities   Data Reviewed: Basic Metabolic Panel:  Recent Labs Lab 05/16/15 0858 05/17/15 0754 05/18/15 0452 05/19/15 0632  NA 138 134* 135 136  K 4.4 4.9 4.1 4.9  CL 99* 96* 94* 90*  CO2 21* 23 28 29   GLUCOSE 86 151* 112* 104*  BUN 40* 54* 36* 50*  CREATININE 11.09* 12.77* 9.34* 11.71*  CALCIUM 9.7 9.0 8.6* 9.0  PHOS  --  9.7*  --   --    Liver Function Tests:  Recent Labs Lab 05/16/15  [id number] 05/17/15 0754 05/19/15 0632  AST 13*  --  8*  ALT 10*  --  9*  ALKPHOS 62  --  55  BILITOT 0.7  --  0.5  PROT 7.6  --  7.4  ALBUMIN 4.0 3.3* 3.6   No results for input(s): LIPASE, AMYLASE in the last 168 hours. No results for input(s): AMMONIA in the last 168 hours. CBC:  Recent Labs Lab 05/16/15 0858 05/17/15 0754 05/18/15 0452 05/19/15 0632  WBC 6.8 5.4 5.0 6.1  NEUTROABS  --  3.8  --   --   HGB 13.6 11.6* 12.3* 12.2*  HCT 42.5 36.1* 38.9* 38.2*  MCV 81.4 80.8 82.1  82.2  PLT 100* 104* 122* 125*   Cardiac Enzymes:  Recent Labs Lab 05/16/15 1432 05/16/15 1825 05/17/15 0035  TROPONINI 0.03 0.03 0.03   BNP (last 3 results)  Recent Labs  11/20/14 2245 12/06/14 0845 12/12/14 2100  BNP 75.6 86.9 816.3*    ProBNP (last 3 results) No results for input(s): PROBNP in the last 8760 hours.  CBG:  Recent Labs Lab 05/18/15 1649 05/18/15 2053 05/19/15 0757 05/19/15 1423 05/19/15 1649  GLUCAP 98 125* 100* 114* 145*    Recent Results (from the past 240 hour(s))  MRSA PCR Screening     Status: None   Collection Time: 05/16/15  2:28 PM  Result Value Ref Range Status   MRSA by PCR NEGATIVE NEGATIVE Final    Comment:        The GeneXpert MRSA Assay (FDA approved for NASAL specimens only), is one component of a comprehensive MRSA colonization surveillance program. It is not intended to diagnose MRSA infection nor to guide or monitor treatment for MRSA infections.      Studies: Nm Myocar Multi W/spect W/wall Motion / Ef  05/19/2015    There was no ST segment deviation noted during stress.  Defect 1: There is a medium defect of moderate severity.  Findings consistent with prior myocardial infarction.  This is an intermediate risk study.  Nuclear stress EF: 42%.   Intermediate risk study. There moderate sized and intensity fixed, mid to  apical inferolateral wall defect consistent with scar. No significant  reversible ischemia is noted. LVEF 42% with inferolateral hypokinesis.    Scheduled Meds: . amitriptyline  100 mg Oral QHS  . amLODipine  10 mg Oral Daily  . aspirin  325 mg Oral Daily  . atorvastatin  20 mg Oral QHS  . carvedilol  50 mg Oral BID WC  . cinacalcet  90 mg Oral Q supper  . clopidogrel  75 mg Oral Daily  . doxercalciferol  2 mcg Intravenous Q T,Th,Sa-HD  . feeding supplement (NEPRO CARB STEADY)  237 mL Oral BID BM  . finasteride  5 mg Oral Daily  . insulin aspart  0-9 Units Subcutaneous TID WC  . multivitamin   1 tablet Oral QHS  . nitroGLYCERIN  1 inch Topical 4 times per day  . pantoprazole  40 mg Oral Daily  . polyethylene glycol  17 g Oral Daily  . pregabalin  50 mg Oral Daily  . ranolazine  500 mg Oral BID  . sevelamer carbonate  2,400 mg Oral TID WC  . venlafaxine  37.5 mg Oral BID   Continuous Infusions: . sodium chloride      Principal Problem:   Chest pain Active Problems:   ESRD (end stage renal disease)   Hypertension   CAD -S/P LAD BMS 2011, LAD DES 2012- patent cors  Feb 2016   Diabetes mellitus type 2, insulin dependent   Diastolic dysfunction-grade 2   H/O TIA (transient ischemic attack)   Thrombocytopenia   Hypertensive urgency   Weight loss   Malnutrition of moderate degree    Time spent: *25 min    [facility] Pager [phone]*. If 7PM-7AM, please contact night-coverage at [email or web address], password [facility] 05/19/2015, 5:39 PM

## 2015-05-19 NOTE — Progress Notes (Signed)
Pt with emesis x 1 after becoming sick walking to the [place]. Pt states this has been happening off and on over the last year as he has lost 70 lbs. Administered prn zofran per order. Will continue to monitor. Dorthey Sawyer, RN

## 2015-05-19 NOTE — Progress Notes (Signed)
Subjective: 1/10 CP   Objective: Vital signs in last 24 hours: Temp:  [97.4 F (36.3 C)-98.2 F (36.8 C)] 98.2 F (36.8 C) (08/11 1021) Pulse Rate:  [76-82] 82 (08/11 1021) Resp:  [17-18] 18 (08/11 1021) BP: (108-133)/(73-83) 133/83 mmHg (08/11 1021) SpO2:  [96 %-100 %] 98 % (08/11 1021) Weight:  [206 lb (93.441 kg)] 206 lb (93.441 kg) (08/10 2055) Last BM Date: 05/15/15  Intake/Output from previous day: 08/10 0701 - 08/11 0700 In: 960 [P.O.:960] Out: 0  Intake/Output this shift:    Medications Scheduled Meds: . amitriptyline  100 mg Oral QHS  . amLODipine  10 mg Oral Daily  . aspirin  325 mg Oral Daily  . atorvastatin  20 mg Oral QHS  . carvedilol  50 mg Oral BID WC  . cinacalcet  90 mg Oral Q supper  . clopidogrel  75 mg Oral Daily  . doxercalciferol  2 mcg Intravenous Q T,Th,Sa-HD  . feeding supplement (NEPRO CARB STEADY)  237 mL Oral BID BM  . finasteride  5 mg Oral Daily  . insulin aspart  0-9 Units Subcutaneous TID WC  . multivitamin  1 tablet Oral QHS  . nitroGLYCERIN  1 inch Topical 4 times per day  . pantoprazole  40 mg Oral Daily  . polyethylene glycol  17 g Oral Daily  . pregabalin  50 mg Oral Daily  . ranolazine  500 mg Oral BID  . regadenoson      . regadenoson  0.4 mg Intravenous Once  . sevelamer carbonate  2,400 mg Oral TID WC  . venlafaxine  37.5 mg Oral BID   Continuous Infusions: . sodium chloride     PRN Meds:.acetaminophen, gi cocktail, hydrALAZINE, ipratropium-albuterol, morphine injection, nitroGLYCERIN, ondansetron (ZOFRAN) IV  PE: General appearance: alert, cooperative and no distress Heart: regular rate and rhythm and 1/6SYS MM Pulses: 2+ right radial Extremities:  No LEE Skin: Warm and dry Neurologic: grossly normal  Lab Results:   Recent Labs  05/17/15 0754 05/18/15 0452 05/19/15 0632  WBC 5.4 5.0 6.1  HGB 11.6* 12.3* 12.2*  HCT 36.1* 38.9* 38.2*  PLT 104* 122* 125*   BMET  Recent Labs  05/17/15 0754  05/18/15 0452 05/19/15 0632  NA 134* 135 136  K 4.9 4.1 4.9  CL 96* 94* 90*  CO2 23 28 29   GLUCOSE 151* 112* 104*  BUN 54* 36* 50*  CREATININE 12.77* 9.34* 11.71*  CALCIUM 9.0 8.6* 9.0   Cardiac Panel (last 3 results)  Recent Labs  05/16/15 1432 05/16/15 1825 05/17/15 0035  TROPONINI 0.03 0.03 0.03    Assessment/Plan 59 y.o. male with a past medical history significant for CAD (s/p BMS to mid LAD 12/2009 and DES to mid LAD 10/2010 [facility]), HTN, DM, depression, anemia, h/o hematochezia/hematuria in [id number], chronic diastolic CHF, and ESRD on HD who presented to [facility] with chest pain 05/16/15.   Principal Problem:   Chest pain Active Problems:   ESRD (end stage renal disease)   Hypertension   CAD -S/P LAD BMS 2011, LAD DES 2012- patent cors Feb 2016   Diabetes mellitus type 2, insulin dependent   Diastolic dysfunction-grade 2   H/O TIA (transient ischemic attack)   Thrombocytopenia   Hypertensive urgency   Weight loss   Malnutrition of moderate degree   Ruled out for MI.  He tolerated the Lexiscan very well.  Interpretation to follow   Tarri Fuller PA-C 05/19/2015 11:01 AM   Patient  seen and examined. Agree with assessment and plan. Back from [place] nuclear study. He tells me that he became nauseated and vomited downstairs and upon return.  No chest pain. Results pending.   Troy Sine, MD, [facility] 05/19/2015 3:21 PM

## 2015-05-19 NOTE — Progress Notes (Signed)
Subjective:   Reports chest pain is better this AM- for stress test today.   Objective Filed Vitals:   05/18/15 1738 05/18/15 2055 05/19/15 0349 05/19/15 1021  BP: 108/73 122/77 125/78 133/83  Pulse: 76 77 80 82  Temp: 98 F (36.7 C) 97.5 F (36.4 C) 97.4 F (36.3 C) 98.2 F (36.8 C)  TempSrc: Oral Oral Oral Oral  Resp: 18 17 17 18   Height:      Weight:  93.441 kg (206 lb)    SpO2: 100% 97% 96% 98%   Physical Exam General: alert and oriented. Resting in bed, no acute distress Heart: RRR Lungs: CTA, unlabored Abdomen: soft, nontender +BS  Extremities: no LE edema Dialysis Access:  L AVF +b/t  TTS AF 4h 500800 88kg 2/2.25 bath Heparin 8000 LUA AVF Venofer 100 x 5 thru 8/11 + Mircera 100 q 2 , last 7/26 on hold Last ferr 826, pth 410, tsat 25%  Assessment/Plan: 1. Chest pain- per primary. CT angio- no PE/ CT negative. Trop negative. ECHO 8/10 EF 45-50% 2. ESRD - TTS AF. HD today 3. Anemia - hgb 12.2, hold now and watch CBC 4. Secondary hyperparathyroidism - phos 9.7 sensipar, hectorol, renvela 5. HTN/volume - 133/83. Norvasc, coreg, hydralazine. Close to edw 6. Nutrition - renal diet. Alb 3.6 vitamin 7. DM- per primary  Shelle Iron, NP [facility] [phone] 05/19/2015,10:32 AM   Pt seen, examined and agree w A/P as above.  Kelly Splinter MD pager [phone]    cell [phone] 05/19/2015, 3:22 PM     Additional Objective Labs: Basic Metabolic Panel:  Recent Labs Lab 05/17/15 0754 05/18/15 0452 05/19/15 0632  NA 134* 135 136  K 4.9 4.1 4.9  CL 96* 94* 90*  CO2 23 28 29   GLUCOSE 151* 112* 104*  BUN 54* 36* 50*  CREATININE 12.77* 9.34* 11.71*  CALCIUM 9.0 8.6* 9.0  PHOS 9.7*  --   --    Liver Function Tests:  Recent Labs Lab 05/16/15 0858 05/17/15 0754 05/19/15 0632  AST 13*  --  8*  ALT 10*  --  9*  ALKPHOS 62  --  55  BILITOT 0.7  --  0.5  PROT 7.6  --  7.4  ALBUMIN 4.0 3.3* 3.6   No results for input(s): LIPASE,  AMYLASE in the last 168 hours. CBC:  Recent Labs Lab 05/16/15 0858 05/17/15 0754 05/18/15 0452 05/19/15 0632  WBC 6.8 5.4 5.0 6.1  NEUTROABS  --  3.8  --   --   HGB 13.6 11.6* 12.3* 12.2*  HCT 42.5 36.1* 38.9* 38.2*  MCV 81.4 80.8 82.1 82.2  PLT 100* 104* 122* 125*   Blood Culture No results found for: SDES, SPECREQUEST, CULT, REPTSTATUS  Cardiac Enzymes:  Recent Labs Lab 05/16/15 1432 05/16/15 1825 05/17/15 0035  TROPONINI 0.03 0.03 0.03   CBG:  Recent Labs Lab 05/18/15 0733 05/18/15 1118 05/18/15 1649 05/18/15 2053 05/19/15 0757  GLUCAP 86 122* 98 125* 100*   Iron Studies: No results for input(s): IRON, TIBC, TRANSFERRIN, FERRITIN in the last 72 hours. @lablastinr3 @ Studies/Results: Ct Angio Chest Pe W/cm &/or Wo Cm  05/17/2015   CLINICAL DATA:  Chest pain, shortness of breath.  EXAM: CT ANGIOGRAPHY CHEST WITH CONTRAST  TECHNIQUE: Multidetector CT imaging of the chest was performed using the standard protocol during bolus administration of intravenous contrast. Multiplanar CT image reconstructions and MIPs were obtained to evaluate the vascular anatomy.  CONTRAST:  140mL OMNIPAQUE IOHEXOL 350 MG/ML SOLN  COMPARISON:  CT scan of May 16, 2015.  FINDINGS: No pneumothorax or pleural effusion is noted. No acute pulmonary disease is noted. No definite evidence of pulmonary embolus is noted. Atherosclerosis of thoracic aorta is noted without aneurysm formation. Visualized portion of upper abdomen is unremarkable. No mediastinal mass or adenopathy is noted. No significant osseous abnormality is noted.  Review of the MIP images confirms the above findings.  IMPRESSION: No definite evidence of pulmonary embolus. No acute cardiopulmonary abnormality seen.   Electronically Signed   By: Marijo Conception, M.D.   On: 05/17/2015 17:54   Medications: . sodium chloride     . amitriptyline  100 mg Oral QHS  . amLODipine  10 mg Oral Daily  . aspirin  325 mg Oral Daily  . atorvastatin   20 mg Oral QHS  . carvedilol  50 mg Oral BID WC  . cinacalcet  90 mg Oral Q supper  . clopidogrel  75 mg Oral Daily  . doxercalciferol  2 mcg Intravenous Q T,Th,Sa-HD  . feeding supplement (NEPRO CARB STEADY)  237 mL Oral BID BM  . finasteride  5 mg Oral Daily  . insulin aspart  0-9 Units Subcutaneous TID WC  . multivitamin  1 tablet Oral QHS  . nitroGLYCERIN  1 inch Topical 4 times per day  . pantoprazole  40 mg Oral Daily  . polyethylene glycol  17 g Oral Daily  . pregabalin  50 mg Oral Daily  . ranolazine  500 mg Oral BID  . sevelamer carbonate  2,400 mg Oral TID WC  . venlafaxine  37.5 mg Oral BID

## 2015-05-20 DIAGNOSIS — E119 Type 2 diabetes mellitus without complications: Secondary | ICD-10-CM | POA: Diagnosis not present

## 2015-05-20 DIAGNOSIS — I10 Essential (primary) hypertension: Secondary | ICD-10-CM | POA: Diagnosis not present

## 2015-05-20 DIAGNOSIS — N186 End stage renal disease: Secondary | ICD-10-CM | POA: Diagnosis not present

## 2015-05-20 DIAGNOSIS — R079 Chest pain, unspecified: Secondary | ICD-10-CM | POA: Diagnosis not present

## 2015-05-20 LAB — GLUCOSE, CAPILLARY
GLUCOSE-CAPILLARY: 104 mg/dL — AB (ref 65–99)
Glucose-Capillary: 140 mg/dL — ABNORMAL HIGH (ref 65–99)

## 2015-05-20 NOTE — Progress Notes (Signed)
Patient Name: [name] [name] Date of Encounter: 05/20/2015   SUBJECTIVE  Feels better. Had sharp substernal chest pain this AM, resolved by it self in few seconds. Denies SOB.   CURRENT MEDS . amitriptyline  100 mg Oral QHS  . amLODipine  10 mg Oral Daily  . aspirin  325 mg Oral Daily  . atorvastatin  20 mg Oral QHS  . carvedilol  50 mg Oral BID WC  . cinacalcet  90 mg Oral Q supper  . clopidogrel  75 mg Oral Daily  . doxercalciferol  2 mcg Intravenous Q T,Th,Sa-HD  . feeding supplement (NEPRO CARB STEADY)  237 mL Oral BID BM  . finasteride  5 mg Oral Daily  . insulin aspart  0-9 Units Subcutaneous TID WC  . multivitamin  1 tablet Oral QHS  . nitroGLYCERIN  1 inch Topical 4 times per day  . pantoprazole  40 mg Oral Daily  . polyethylene glycol  17 g Oral Daily  . pregabalin  50 mg Oral Daily  . ranolazine  500 mg Oral BID  . sevelamer carbonate  2,400 mg Oral TID WC  . venlafaxine  37.5 mg Oral BID    OBJECTIVE  Filed Vitals:   05/19/15 2037 05/19/15 2133 05/20/15 0428 05/20/15 0947  BP: 144/86 144/78 107/68 136/48  Pulse: 93 94 70 80  Temp: 98.3 F (36.8 C) 98.1 F (36.7 C) 98.4 F (36.9 C) 97.8 F (36.6 C)  TempSrc: Oral Oral Oral Oral  Resp: 14 18 19 18   Height:      Weight: 198 lb 6.6 oz (90 kg)     SpO2: 93% 95% 93% 94%    Intake/Output Summary (Last 24 hours) at 05/20/15 1324 Last data filed at 05/20/15 1300  Gross per 24 hour  Intake   1660 ml  Output   2940 ml  Net  -1280 ml   Filed Weights   05/18/15 2055 05/19/15 1733 05/19/15 2037  Weight: 206 lb (93.441 kg) 209 lb 10.5 oz (95.1 kg) 198 lb 6.6 oz (90 kg)    PHYSICAL EXAM  General: Pleasant, NAD. Neuro: Alert and oriented X 3. Moves all extremities spontaneously. Psych: Normal affect. HEENT:  Normal  Neck: Supple without bruits or JVD. Lungs:  Resp regular and unlabored, CTA. Heart: RRR no s3, s4. 1/6 Systolic murmur. Abdomen: Soft, non-tender, non-distended, BS + x 4.  Extremities: No  clubbing, cyanosis or edema. DP/PT/Radials 2+ and equal bilaterally.L AVF + thrill/+ bruit.  Accessory Clinical Findings  CBC  Recent Labs  05/19/15 0632 05/19/15 1800  WBC 6.1 7.7  HGB 12.2* 11.6*  HCT 38.2* 35.4*  MCV 82.2 79.9  PLT 125* [id number]*   Basic Metabolic Panel  Recent Labs  05/19/15 0632 05/19/15 1800  NA 136 131*  K 4.9 4.6  CL 90* 90*  CO2 29 24  GLUCOSE 104* 131*  BUN 50* 55*  CREATININE 11.71* 12.35*  CALCIUM 9.0 8.6*  PHOS  --  8.8*   Liver Function Tests  Recent Labs  05/19/15 0632 05/19/15 1800  AST 8*  --   ALT 9*  --   ALKPHOS 55  --   BILITOT 0.5  --   PROT 7.4  --   ALBUMIN 3.6 3.4*    TELE  NSR  Radiology/Studies  Ct Chest Wo Contrast  05/16/2015   CLINICAL DATA:  Chest pain and recent weight loss  EXAM: CT CHEST WITHOUT CONTRAST  TECHNIQUE: Multidetector CT imaging of the chest was performed following  the standard protocol without IV contrast.  COMPARISON:  Chest x-ray from earlier in the same day  FINDINGS: The lungs are well aerated bilaterally. The focal infiltrate or sizable effusion is seen. No parenchymal nodules are noted.  The thoracic inlet is within normal limits. Aortic atherosclerotic calcifications are seen without aneurysmal dilatation. Heavy coronary calcifications are noted. No significant hilar or mediastinal adenopathy is noted. A few small lymph nodes are noted in the pretracheal region but demonstrate normal fatty hila.  The upper abdomen shows no acute abnormality. No acute bony abnormality is noted.  IMPRESSION: Aortic atherosclerotic disease and heavy coronary calcifications.  No acute abnormality is identified.   Electronically Signed   By: Inez Catalina M.D.   On: 05/16/2015 13:26   Ct Angio Chest Pe W/cm &/or Wo Cm  05/17/2015   CLINICAL DATA:  Chest pain, shortness of breath.  EXAM: CT ANGIOGRAPHY CHEST WITH CONTRAST  TECHNIQUE: Multidetector CT imaging of the chest was performed using the standard protocol during  bolus administration of intravenous contrast. Multiplanar CT image reconstructions and MIPs were obtained to evaluate the vascular anatomy.  CONTRAST:  160mL OMNIPAQUE IOHEXOL 350 MG/ML SOLN  COMPARISON:  CT scan of May 16, 2015.  FINDINGS: No pneumothorax or pleural effusion is noted. No acute pulmonary disease is noted. No definite evidence of pulmonary embolus is noted. Atherosclerosis of thoracic aorta is noted without aneurysm formation. Visualized portion of upper abdomen is unremarkable. No mediastinal mass or adenopathy is noted. No significant osseous abnormality is noted.  Review of the MIP images confirms the above findings.  IMPRESSION: No definite evidence of pulmonary embolus. No acute cardiopulmonary abnormality seen.   Electronically Signed   By: Marijo Conception, M.D.   On: 05/17/2015 17:54   Nm Myocar Multi W/spect W/wall Motion / Ef  05/19/2015    There was no ST segment deviation noted during stress.  Defect 1: There is a medium defect of moderate severity.  Findings consistent with prior myocardial infarction.  This is an intermediate risk study.  Nuclear stress EF: 42%.   Intermediate risk study. There moderate sized and intensity fixed, mid to  apical inferolateral wall defect consistent with scar. No significant  reversible ischemia is noted. LVEF 42% with inferolateral hypokinesis.   Dg Chest Portable 1 View  05/16/2015   CLINICAL DATA:  59 year old hypertensive diabetic male on dialysis presenting with 3 hours chest pain. Subsequent encounter.  EXAM: PORTABLE CHEST - 1 VIEW  COMPARISON:  03/31/2015  FINDINGS: Chronically elevated left hemidiaphragm.  Cardiomegaly with pulmonary vascular congestion.  No segmental consolidation or gross pneumothorax.  IMPRESSION: Cardiomegaly with pulmonary vascular congestion.   Electronically Signed   By: Genia Del M.D.   On: 05/16/2015 09:05    ASSESSMENT AND PLAN Principal Problem:   Chest pain Active Problems:   ESRD (end stage  renal disease)   Hypertension   CAD -S/P LAD BMS 2011, LAD DES 2012- patent cors Feb 2016   Diabetes mellitus type 2, insulin dependent   Diastolic dysfunction-grade 2   H/O TIA (transient ischemic attack)   Thrombocytopenia   Hypertensive urgency   Weight loss   Malnutrition of moderate degree    Plan: Myoview intermediate risk study. There moderate sized and intensity fixed, mid to apical inferolateral wall defect consistent with scar. No significant reversible ischemia is noted. LVEF 42% with inferolateral hypokinesis. - 2D 8/10 showed slightly lower EF (45-50%) with 2+ MR compared to feb 2016. He is on Tuesday, Thursday and  Saturday HD. Will discuss with MD for next plan, likely medical management.    Jarrett Soho PA-C Pager [phone]  Agree with above assessment. His myoview shows no reversible ischemia. He has mild LV  systolic dysfunction. His rhythm is NSR. Would continue current medical therapy. No indication for repeat cath at this time. Exam reveals no edema, clear lungs and soft holosystolic murmur of mitral regurgitation at apex.

## 2015-05-20 NOTE — Progress Notes (Signed)
Nutrition Follow-up  DOCUMENTATION CODES:   Non-severe (moderate) malnutrition in context of acute illness/injury  INTERVENTION:   Continue Nepro Shake po BID, each supplement provides 425 kcal and 19 grams protein.  Encourage adequate PO intake.   NUTRITION DIAGNOSIS:   Increased nutrient needs related to chronic illness as evidenced by estimated needs; ongoing  GOAL:   Patient will meet greater than or equal to 90% of their needs; met  MONITOR:   PO intake, Supplement acceptance, Weight trends, Labs, I & O's  REASON FOR ASSESSMENT:   Consult  (weight loss)  ASSESSMENT:   59 y.o. male with HTN, ESRD on dialysis Tues/Thurs/Sat, Type 2 Diabetes Mellitus, and CAD who presented to the ED for sharp chest pain  Meal completion has been 100%. Pt has Nepro Shake ordered and has been consuming them intermittently. RD to continue with current orders. Pt encouraged to continue eating his food at meals and to drink his supplements.   Labs: Low sodium, chloride, calcium, GFR. High BUN, creatinine, and phosphorous (8.8).  Diet Order:  Diet renal/carb modified with fluid restriction Diet-HS Snack?: Nothing; Room service appropriate?: Yes; Fluid consistency:: Thin  Skin:  Reviewed, no issues  Last BM:  8/11  Height:   Ht Readings from Last 1 Encounters:  05/16/15 6' (1.829 m)    Weight:   Wt Readings from Last 1 Encounters:  05/19/15 198 lb 6.6 oz (90 kg)    Ideal Body Weight:  80.9 kg  BMI:  Body mass index is 26.9 kg/(m^2).  Estimated Nutritional Needs:   Kcal:  2200-2400  Protein:  115-125 grams  Fluid:  1.2 L/day  EDUCATION NEEDS:   No education needs identified at this time  Corrin Parker, MS, RD, LDN Pager # [phone] After hours/ weekend pager # [phone]

## 2015-05-20 NOTE — Progress Notes (Signed)
[facility] Progress Note  Assessment/Plan: 1. Chest pain- per primary. CT angio- no PE/ CT negative. Trop negative. ECHO 8/10 EF 45-50%. Lexiscan results pending. Cardiology following.  2. ESRD - TTS AF. HD yesterday. Will write orders for tomorrow.  3. Anemia - hgb 11.6, follow CBC 4. Secondary hyperparathyroidism - phos 9.7 sensipar, hectorol, renvela 5. HTN/volume - HD yesterday. Net UF 2940 post wt 90 kg. OP EDW 89. HD tomorrow. BP 107/68-161/81. Will attempt to get to EDW 89 kg. K+ 4.6 6. Nutrition - renal diet. Alb 3.6 vitamin 7. DM- per primary  Rita H. Brown NP-C 05/20/2015, 9:38 AM  [facility] [phone]  Pt seen, examined and agree w A/P as above.  Kelly Splinter MD pager [phone]    cell [phone] 05/20/2015, 2:11 PM    Subjective:   "I'm OK". Denies chest pain at present but says he had some earlier this morning.   Objective Filed Vitals:   05/19/15 2030 05/19/15 2037 05/19/15 2133 05/20/15 0428  BP: 144/83 144/86 144/78 107/68  Pulse: 93 93 94 70  Temp:  98.3 F (36.8 C) 98.1 F (36.7 C) 98.4 F (36.9 C)  TempSrc:  Oral Oral Oral  Resp:  14 18 19   Height:      Weight:  90 kg (198 lb 6.6 oz)    SpO2:  93% 95% 93%   Physical Exam General: well nourished AA male in NAD Heart: S1,S2, RRR. ? 1/VI M 3rd ICS LSB Lungs: bilateral breath sounds CTA Abdomen: Nontender, active BS.  Extremities: No edema.  Dialysis Access: L AVF + thrill/+ bruit. Sm. Ulcer <2 cm diameter upper portion of AVF where he says he scratched. No pus/drainage. Monitor.   TTS AF 4h 500800 88kg 2/2.25 bath Heparin 8000 LUA AVF Venofer 100 x 5 thru 8/11 + Mircera 100 q 2 , last 7/26 on hold Last ferr 826, pth 410, tsat 25% Additional Objective Labs: Basic Metabolic Panel:  Recent Labs Lab 05/17/15 0754 05/18/15 0452 05/19/15 0632 05/19/15 1800  NA 134* 135 136 131*  K 4.9 4.1 4.9 4.6  CL 96* 94* 90* 90*  CO2 23 28 29 24   GLUCOSE 151* 112*  104* 131*  BUN 54* 36* 50* 55*  CREATININE 12.77* 9.34* 11.71* 12.35*  CALCIUM 9.0 8.6* 9.0 8.6*  PHOS 9.7*  --   --  8.8*   Liver Function Tests:  Recent Labs Lab 05/16/15 0858 05/17/15 0754 05/19/15 0632 05/19/15 1800  AST 13*  --  8*  --   ALT 10*  --  9*  --   ALKPHOS 62  --  55  --   BILITOT 0.7  --  0.5  --   PROT 7.6  --  7.4  --   ALBUMIN 4.0 3.3* 3.6 3.4*   No results for input(s): LIPASE, AMYLASE in the last 168 hours. CBC:  Recent Labs Lab 05/16/15 0858 05/17/15 0754 05/18/15 0452 05/19/15 0632 05/19/15 1800  WBC 6.8 5.4 5.0 6.1 7.7  NEUTROABS  --  3.8  --   --   --   HGB 13.6 11.6* 12.3* 12.2* 11.6*  HCT 42.5 36.1* 38.9* 38.2* 35.4*  MCV 81.4 80.8 82.1 82.2 79.9  PLT 100* 104* 122* 125* 139*   Blood Culture No results found for: SDES, SPECREQUEST, CULT, REPTSTATUS  Cardiac Enzymes:  Recent Labs Lab 05/16/15 1432 05/16/15 1825 05/17/15 0035  TROPONINI 0.03 0.03 0.03   CBG:  Recent Labs Lab 05/19/15 0757 05/19/15 1423 05/19/15 1649  05/19/15 2132 05/20/15 0749  GLUCAP 100* 114* 145* 100* 104*   Iron Studies: No results for input(s): IRON, TIBC, TRANSFERRIN, FERRITIN in the last 72 hours. @lablastinr3 @ Studies/Results: Nm Myocar Multi W/spect W/wall Motion / Ef  05/19/2015    There was no ST segment deviation noted during stress.  Defect 1: There is a medium defect of moderate severity.  Findings consistent with prior myocardial infarction.  This is an intermediate risk study.  Nuclear stress EF: 42%.   Intermediate risk study. There moderate sized and intensity fixed, mid to  apical inferolateral wall defect consistent with scar. No significant  reversible ischemia is noted. LVEF 42% with inferolateral hypokinesis.   Medications: . sodium chloride     . amitriptyline  100 mg Oral QHS  . amLODipine  10 mg Oral Daily  . aspirin  325 mg Oral Daily  . atorvastatin  20 mg Oral QHS  . carvedilol  50 mg Oral BID WC  . cinacalcet  90 mg  Oral Q supper  . clopidogrel  75 mg Oral Daily  . doxercalciferol  2 mcg Intravenous Q T,Th,Sa-HD  . feeding supplement (NEPRO CARB STEADY)  237 mL Oral BID BM  . finasteride  5 mg Oral Daily  . insulin aspart  0-9 Units Subcutaneous TID WC  . multivitamin  1 tablet Oral QHS  . nitroGLYCERIN  1 inch Topical 4 times per day  . pantoprazole  40 mg Oral Daily  . polyethylene glycol  17 g Oral Daily  . pregabalin  50 mg Oral Daily  . ranolazine  500 mg Oral BID  . sevelamer carbonate  2,400 mg Oral TID WC  . venlafaxine  37.5 mg Oral BID

## 2015-05-20 NOTE — Progress Notes (Signed)
Patient Discharge:  Disposition: Pt discharged home to self  Education: Pt educated on medications, follow up appointments and all discharge instructions. Pt verbalized understanding.    IV: Removed  Telemetry: Removed. CCMD notifed.   Follow-up appointments: Case manager spoke with pt and gave pt information on obtaining a PCP. Pt verbalized understanding.   Prescriptions: N/A  Transportation: Pt given bus pass  Belongings: All belongings taken with pt.

## 2015-05-20 NOTE — Discharge Summary (Signed)
Physician Discharge Summary  [name] [name] [id number] DOB: [birth year] DOA: 05/16/2015  PCP: [name]  Admit date: 05/16/2015 Discharge date: 05/20/2015  Time spent: 25* minutes  Recommendations for Outpatient Follow-up:  1. Follow up PCP in 2 weeks  Discharge Diagnoses:  Principal Problem:   Chest pain Active Problems:   ESRD (end stage renal disease)   Hypertension   CAD -S/P LAD BMS 2011, LAD DES 2012- patent cors Feb 2016   Diabetes mellitus type 2, insulin dependent   Diastolic dysfunction-grade 2   H/O TIA (transient ischemic attack)   Thrombocytopenia   Hypertensive urgency   Weight loss   Malnutrition of moderate degree   Discharge Condition: *Stable  Diet recommendation: Diabetic diet  Filed Weights   05/18/15 2055 05/19/15 1733 05/19/15 2037  Weight: 93.441 kg (206 lb) 95.1 kg (209 lb 10.5 oz) 90 kg (198 lb 6.6 oz)    History of present illness:  59 y.o. male with HTN, ESRD on dialysis Tues/Thurs/Sat, Type 2 Diabetes Mellitus, and CAD who presented to the ED for sharp chest pain that began at 6:00 am. The pain did not wake him out of sleep. It worsened while he was walking to the store and was accompanied with shortness of breath. The pain is sharp, constant, 6/10 with some radiation to his right elbow and occasional heart palpitations. Pain is better when laying down and worse with sitting forward and walking. He believed it was due to an elevated potassium, as he was told on Saturday at dialysis his potassium was high. He had two episodes of vomiting with no blood or abdominal pain. He also reports bilateral leg weakness that worsened as he was walking. Patient has been compliant with dialysis treatments and has not noticed any edema or weight gain. Per patient, he has lost approximately 70lbs over the past year and has had a suppressed appetite with vomiting after eating. Cardiac cath 11/23/14 demonstrated no significant coronary obstructive disease.    Hospital Course:  1. Chest pain- patient continues to have 3/10 chest pain, cardiac cath on 2/16 showed no significant obstructive disease, troponin is negative so far. Cardiology saw the patient and he underwent  myoview stress test , which showed no reversible ischemia. Continue with medical management.   2. Hypertensive urgency- continue amlodipine, Coreg. 3. ESRD- continue hemodialysis Tuesday Thursday and Saturday.  4. Diabetes mellitus- continue Lantus as outpatient. 5. Thrombocytopenia- platelets today 122. We'll follow CBC in a.m. 6. History of significant weight loss- patient has about 48 pound weight loss over the past 1 year, CT abdomen and pelvis done in May for abdominal pain was unremarkable for any mass. CT of the chest done without contrast on admission showing heavy coronary calcification with atherosclerotic disease. No mass or lung nodule seen. Patient will need GI evaluation for colonoscopy as outpatient. Procedures: NM myoview  Consultations:  Cardiology   Discharge Exam: Filed Vitals:   05/20/15 0947  BP: 136/48  Pulse: 80  Temp: 97.8 F (36.6 C)  Resp: 18    General: Appear in no acute distress Cardiovascular: S1s2 RRR Respiratory: Clear bilaterally  Discharge Instructions   Discharge Instructions    Diet - low sodium heart healthy    Complete by:  As directed      Increase activity slowly    Complete by:  As directed           Current Discharge Medication List    CONTINUE these medications which have NOT CHANGED   Details  acetaminophen (TYLENOL) 500 MG tablet Take 500 mg by mouth every 6 (six) hours as needed for headache.     amitriptyline (ELAVIL) 100 MG tablet Take 1 tablet (100 mg total) by mouth at bedtime. Qty: 30 tablet, Refills: 0    amLODipine (NORVASC) 10 MG tablet Take 10 mg by mouth daily.    aspirin 325 MG tablet Take 1 tablet (325 mg total) by mouth daily. Qty: 30 tablet, Refills: 0    atorvastatin (LIPITOR) 20 MG  tablet Take 20 mg by mouth at bedtime.     carvedilol (COREG) 25 MG tablet Take 50 mg by mouth 2 (two) times daily.     cinacalcet (SENSIPAR) 90 MG tablet Take 90 mg by mouth at bedtime.     clopidogrel (PLAVIX) 75 MG tablet Take 1 tablet (75 mg total) by mouth daily. Qty: 30 tablet, Refills: 11    finasteride (PROSCAR) 5 MG tablet Take 5 mg by mouth daily.    insulin glargine (LANTUS) 100 UNIT/ML injection Inject 10 Units into the skin at bedtime.    multivitamin (RENA-VIT) TABS tablet Take 1 tablet by mouth at bedtime. Qty: 30 tablet, Refills: 0    pregabalin (LYRICA) 50 MG capsule Take 1 capsule (50 mg total) by mouth daily. Qty: 30 capsule, Refills: 0    ranolazine (RANEXA) 500 MG 12 hr tablet Take 1 tablet (500 mg total) by mouth 2 (two) times daily. Qty: 60 tablet, Refills: 3    sevelamer carbonate (RENVELA) 800 MG tablet Take 2,400 mg by mouth 3 (three) times daily with meals.     sucralfate (CARAFATE) 1 GM/10ML suspension Take 10 mLs (1 g total) by mouth 4 (four) times daily -  with meals and at bedtime. Qty: 420 mL, Refills: 0    venlafaxine (EFFEXOR) 37.5 MG tablet Take 37.5 mg by mouth 2 (two) times daily.    calcium acetate (PHOSLO) 667 MG capsule Take 2 capsules (1,334 mg total) by mouth 3 (three) times daily with meals. Qty: 90 capsule, Refills: 0    Darbepoetin Alfa (ARANESP) 25 MCG/0.42ML SOSY injection Inject 0.42 mLs (25 mcg total) into the vein every Tuesday with hemodialysis. Qty: 14.28 mL    doxercalciferol (HECTOROL) 4 MCG/2ML injection Inject 1 mL (2 mcg total) into the vein Every Tuesday,Thursday,and Saturday with dialysis. Qty: 2 mL    ferric gluconate 62.5 mg in sodium chloride 0.9 % 100 mL Inject 62.5 mg into the vein every Thursday with hemodialysis.    insulin aspart (NOVOLOG) 100 UNIT/ML injection Inject 15-20 Units into the skin 3 (three) times daily as needed for high blood sugar (CBG >150).     Ipratropium-Albuterol (COMBIVENT RESPIMAT)  20-100 MCG/ACT AERS respimat Inhale 2 puffs into the lungs every 6 (six) hours as needed for wheezing.    meclizine (ANTIVERT) 25 MG tablet Take 25 mg by mouth 3 (three) times daily as needed for dizziness.    ondansetron (ZOFRAN) 4 MG tablet Take 1 tablet (4 mg total) by mouth every 6 (six) hours. Qty: 12 tablet, Refills: 0    pantoprazole (PROTONIX) 40 MG tablet Take 1 tablet (40 mg total) by mouth daily. Qty: 30 tablet, Refills: 0       Allergies  Allergen Reactions  . Enalapril Hives      The results of significant diagnostics from this hospitalization (including imaging, microbiology, ancillary and laboratory) are listed below for reference.    Significant Diagnostic Studies: Ct Chest Wo Contrast  05/16/2015   CLINICAL DATA:  Chest pain  and recent weight loss  EXAM: CT CHEST WITHOUT CONTRAST  TECHNIQUE: Multidetector CT imaging of the chest was performed following the standard protocol without IV contrast.  COMPARISON:  Chest x-ray from earlier in the same day  FINDINGS: The lungs are well aerated bilaterally. The focal infiltrate or sizable effusion is seen. No parenchymal nodules are noted.  The thoracic inlet is within normal limits. Aortic atherosclerotic calcifications are seen without aneurysmal dilatation. Heavy coronary calcifications are noted. No significant hilar or mediastinal adenopathy is noted. A few small lymph nodes are noted in the pretracheal region but demonstrate normal fatty hila.  The upper abdomen shows no acute abnormality. No acute bony abnormality is noted.  IMPRESSION: Aortic atherosclerotic disease and heavy coronary calcifications.  No acute abnormality is identified.   Electronically Signed   By: Inez Catalina M.D.   On: 05/16/2015 13:26   Ct Angio Chest Pe W/cm &/or Wo Cm  05/17/2015   CLINICAL DATA:  Chest pain, shortness of breath.  EXAM: CT ANGIOGRAPHY CHEST WITH CONTRAST  TECHNIQUE: Multidetector CT imaging of the chest was performed using the standard  protocol during bolus administration of intravenous contrast. Multiplanar CT image reconstructions and MIPs were obtained to evaluate the vascular anatomy.  CONTRAST:  129mL OMNIPAQUE IOHEXOL 350 MG/ML SOLN  COMPARISON:  CT scan of May 16, 2015.  FINDINGS: No pneumothorax or pleural effusion is noted. No acute pulmonary disease is noted. No definite evidence of pulmonary embolus is noted. Atherosclerosis of thoracic aorta is noted without aneurysm formation. Visualized portion of upper abdomen is unremarkable. No mediastinal mass or adenopathy is noted. No significant osseous abnormality is noted.  Review of the MIP images confirms the above findings.  IMPRESSION: No definite evidence of pulmonary embolus. No acute cardiopulmonary abnormality seen.   Electronically Signed   By: Marijo Conception, M.D.   On: 05/17/2015 17:54   Nm Myocar Multi W/spect W/wall Motion / Ef  05/19/2015    There was no ST segment deviation noted during stress.  Defect 1: There is a medium defect of moderate severity.  Findings consistent with prior myocardial infarction.  This is an intermediate risk study.  Nuclear stress EF: 42%.   Intermediate risk study. There moderate sized and intensity fixed, mid to  apical inferolateral wall defect consistent with scar. No significant  reversible ischemia is noted. LVEF 42% with inferolateral hypokinesis.   Dg Chest Portable 1 View  05/16/2015   CLINICAL DATA:  59 year old hypertensive diabetic male on dialysis presenting with 3 hours chest pain. Subsequent encounter.  EXAM: PORTABLE CHEST - 1 VIEW  COMPARISON:  03/31/2015  FINDINGS: Chronically elevated left hemidiaphragm.  Cardiomegaly with pulmonary vascular congestion.  No segmental consolidation or gross pneumothorax.  IMPRESSION: Cardiomegaly with pulmonary vascular congestion.   Electronically Signed   By: Genia Del M.D.   On: 05/16/2015 09:05    Microbiology: Recent Results (from the past 240 hour(s))  MRSA PCR Screening      Status: None   Collection Time: 05/16/15  2:28 PM  Result Value Ref Range Status   MRSA by PCR NEGATIVE NEGATIVE Final    Comment:        The GeneXpert MRSA Assay (FDA approved for NASAL specimens only), is one component of a comprehensive MRSA colonization surveillance program. It is not intended to diagnose MRSA infection nor to guide or monitor treatment for MRSA infections.      Labs: Basic Metabolic Panel:  Recent Labs Lab 05/16/15 0858 05/17/15 0754 05/18/15  [id number] 05/19/15 0632 05/19/15 1800  NA 138 134* 135 136 131*  K 4.4 4.9 4.1 4.9 4.6  CL 99* 96* 94* 90* 90*  CO2 21* 23 28 29 24   GLUCOSE 86 151* 112* 104* 131*  BUN 40* 54* 36* 50* 55*  CREATININE 11.09* 12.77* 9.34* 11.71* 12.35*  CALCIUM 9.7 9.0 8.6* 9.0 8.6*  PHOS  --  9.7*  --   --  8.8*   Liver Function Tests:  Recent Labs Lab 05/16/15 0858 05/17/15 0754 05/19/15 0632 05/19/15 1800  AST 13*  --  8*  --   ALT 10*  --  9*  --   ALKPHOS 62  --  55  --   BILITOT 0.7  --  0.5  --   PROT 7.6  --  7.4  --   ALBUMIN 4.0 3.3* 3.6 3.4*   No results for input(s): LIPASE, AMYLASE in the last 168 hours. No results for input(s): AMMONIA in the last 168 hours. CBC:  Recent Labs Lab 05/16/15 0858 05/17/15 0754 05/18/15 0452 05/19/15 0632 05/19/15 1800  WBC 6.8 5.4 5.0 6.1 7.7  NEUTROABS  --  3.8  --   --   --   HGB 13.6 11.6* 12.3* 12.2* 11.6*  HCT 42.5 36.1* 38.9* 38.2* 35.4*  MCV 81.4 80.8 82.1 82.2 79.9  PLT 100* 104* 122* 125* 139*   Cardiac Enzymes:  Recent Labs Lab 05/16/15 1432 05/16/15 1825 05/17/15 0035  TROPONINI 0.03 0.03 0.03   BNP: BNP (last 3 results)  Recent Labs  11/20/14 2245 12/06/14 0845 12/12/14 2100  BNP 75.6 86.9 816.3*    ProBNP (last 3 results) No results for input(s): PROBNP in the last 8760 hours.  CBG:  Recent Labs Lab 05/19/15 1423 05/19/15 1649 05/19/15 2132 05/20/15 0749 05/20/15 1152  GLUCAP 114* 145* 100* 104* 140*        Signed:  Myiesha Edgar S  [facility] 05/20/2015, 3:20 PM

## 2015-05-27 ENCOUNTER — Encounter: Payer: Self-pay | Admitting: Surgery

## 2015-05-30 ENCOUNTER — Encounter (HOSPITAL_COMMUNITY): Payer: Medicare Other

## 2015-05-30 ENCOUNTER — Ambulatory Visit: Payer: Medicare Other | Admitting: Surgery

## 2015-06-04 ENCOUNTER — Emergency Department (HOSPITAL_COMMUNITY): Payer: Medicare Other

## 2015-06-04 ENCOUNTER — Inpatient Hospital Stay (HOSPITAL_COMMUNITY)
Admission: EM | Admit: 2015-06-04 | Discharge: 2015-06-05 | DRG: 313 | Disposition: A | Discharge status: Home / Self Care | Payer: Medicare Other | Attending: Internal Medicine | Admitting: Internal Medicine

## 2015-06-04 ENCOUNTER — Encounter (HOSPITAL_COMMUNITY): Payer: Self-pay | Admitting: Emergency Medicine

## 2015-06-04 DIAGNOSIS — E119 Type 2 diabetes mellitus without complications: Secondary | ICD-10-CM | POA: Diagnosis not present

## 2015-06-04 DIAGNOSIS — Z7982 Long term (current) use of aspirin: Secondary | ICD-10-CM

## 2015-06-04 DIAGNOSIS — J9610 Chronic respiratory failure, unspecified whether with hypoxia or hypercapnia: Secondary | ICD-10-CM | POA: Diagnosis present

## 2015-06-04 DIAGNOSIS — Z79899 Other long term (current) drug therapy: Secondary | ICD-10-CM | POA: Diagnosis not present

## 2015-06-04 DIAGNOSIS — Z87891 Personal history of nicotine dependence: Secondary | ICD-10-CM

## 2015-06-04 DIAGNOSIS — I152 Hypertension secondary to endocrine disorders: Secondary | ICD-10-CM | POA: Diagnosis not present

## 2015-06-04 DIAGNOSIS — E1129 Type 2 diabetes mellitus with other diabetic kidney complication: Secondary | ICD-10-CM

## 2015-06-04 DIAGNOSIS — R079 Chest pain, unspecified: Secondary | ICD-10-CM

## 2015-06-04 DIAGNOSIS — Z794 Long term (current) use of insulin: Secondary | ICD-10-CM

## 2015-06-04 DIAGNOSIS — K219 Gastro-esophageal reflux disease without esophagitis: Secondary | ICD-10-CM | POA: Diagnosis present

## 2015-06-04 DIAGNOSIS — I150 Renovascular hypertension: Secondary | ICD-10-CM | POA: Diagnosis not present

## 2015-06-04 DIAGNOSIS — R0789 Other chest pain: Principal | ICD-10-CM | POA: Diagnosis present

## 2015-06-04 DIAGNOSIS — I208 Other forms of angina pectoris: Secondary | ICD-10-CM | POA: Diagnosis present

## 2015-06-04 DIAGNOSIS — F329 Major depressive disorder, single episode, unspecified: Secondary | ICD-10-CM | POA: Diagnosis present

## 2015-06-04 DIAGNOSIS — Z955 Presence of coronary angioplasty implant and graft: Secondary | ICD-10-CM

## 2015-06-04 DIAGNOSIS — M940 Chondrocostal junction syndrome [Tietze]: Secondary | ICD-10-CM | POA: Diagnosis present

## 2015-06-04 DIAGNOSIS — I10 Essential (primary) hypertension: Secondary | ICD-10-CM | POA: Diagnosis present

## 2015-06-04 DIAGNOSIS — E1121 Type 2 diabetes mellitus with diabetic nephropathy: Secondary | ICD-10-CM | POA: Diagnosis present

## 2015-06-04 DIAGNOSIS — N186 End stage renal disease: Secondary | ICD-10-CM | POA: Diagnosis present

## 2015-06-04 DIAGNOSIS — I5032 Chronic diastolic (congestive) heart failure: Secondary | ICD-10-CM | POA: Diagnosis present

## 2015-06-04 DIAGNOSIS — Z8639 Personal history of other endocrine, nutritional and metabolic disease: Secondary | ICD-10-CM

## 2015-06-04 DIAGNOSIS — I160 Hypertensive urgency: Secondary | ICD-10-CM | POA: Diagnosis present

## 2015-06-04 DIAGNOSIS — I120 Hypertensive chronic kidney disease with stage 5 chronic kidney disease or end stage renal disease: Secondary | ICD-10-CM | POA: Diagnosis present

## 2015-06-04 DIAGNOSIS — D649 Anemia, unspecified: Secondary | ICD-10-CM | POA: Diagnosis present

## 2015-06-04 DIAGNOSIS — R7989 Other specified abnormal findings of blood chemistry: Secondary | ICD-10-CM | POA: Diagnosis not present

## 2015-06-04 DIAGNOSIS — I2510 Atherosclerotic heart disease of native coronary artery without angina pectoris: Secondary | ICD-10-CM | POA: Diagnosis present

## 2015-06-04 DIAGNOSIS — I2089 Other forms of angina pectoris: Secondary | ICD-10-CM | POA: Diagnosis present

## 2015-06-04 DIAGNOSIS — R778 Other specified abnormalities of plasma proteins: Secondary | ICD-10-CM | POA: Diagnosis present

## 2015-06-04 DIAGNOSIS — Z9861 Coronary angioplasty status: Secondary | ICD-10-CM

## 2015-06-04 LAB — COMPREHENSIVE METABOLIC PANEL
ALBUMIN: 4 g/dL (ref 3.5–5.0)
ALT: 10 U/L — AB (ref 17–63)
AST: 15 U/L (ref 15–41)
Alkaline Phosphatase: 67 U/L (ref 38–126)
Anion gap: 12 (ref 5–15)
BUN: 16 mg/dL (ref 6–20)
CO2: 30 mmol/L (ref 22–32)
CREATININE: 5.54 mg/dL — AB (ref 0.61–1.24)
Calcium: 9.9 mg/dL (ref 8.9–10.3)
Chloride: 95 mmol/L — ABNORMAL LOW (ref 101–111)
GFR calc Af Amer: 12 mL/min — ABNORMAL LOW (ref >60)
GFR calc non Af Amer: 10 mL/min — ABNORMAL LOW (ref >60)
GLUCOSE: 86 mg/dL (ref 65–99)
Potassium: 3.4 mmol/L — ABNORMAL LOW (ref 3.5–5.1)
SODIUM: 137 mmol/L (ref 135–145)
TOTAL PROTEIN: 8.2 g/dL — AB (ref 6.5–8.1)
Total Bilirubin: 0.9 mg/dL (ref 0.3–1.2)

## 2015-06-04 LAB — GLUCOSE, CAPILLARY
Glucose-Capillary: 70 mg/dL (ref 65–99)
Glucose-Capillary: 113 mg/dL — ABNORMAL HIGH (ref 65–99)

## 2015-06-04 LAB — TROPONIN I
TROPONIN I: 0.26 ng/mL — AB (ref <0.031)
TROPONIN I: 0.15 ng/mL — AB (ref <0.031)
TROPONIN I: 0.19 ng/mL — AB (ref <0.031)

## 2015-06-04 LAB — CBC
HCT: 38.2 % — ABNORMAL LOW (ref 39.0–52.0)
Hemoglobin: 12.2 g/dL — ABNORMAL LOW (ref 13.0–17.0)
MCH: 25.7 pg — AB (ref 26.0–34.0)
MCHC: 31.9 g/dL (ref 30.0–36.0)
MCV: 80.6 fL (ref 78.0–100.0)
PLATELETS: 178 10*3/uL (ref 150–400)
RBC: 4.74 MIL/uL (ref 4.22–5.81)
RDW: 17.9 % — AB (ref 11.5–15.5)
WBC: 6 10*3/uL (ref 4.0–10.5)

## 2015-06-04 LAB — I-STAT TROPONIN, ED: TROPONIN I, POC: 0.19 ng/mL — AB (ref 0.00–0.08)

## 2015-06-04 LAB — TSH: TSH: 2.098 u[IU]/mL (ref 0.350–4.500)

## 2015-06-04 MED ORDER — MORPHINE SULFATE (PF) 2 MG/ML IV SOLN: 2.0000 mg | INTRAVENOUS | Status: DC | PRN

## 2015-06-04 MED ORDER — FINASTERIDE 5 MG PO TABS
5.0000 mg | ORAL_TABLET | Freq: Every day | ORAL | Status: DC
Start: 2015-06-05 — End: 2015-06-05
  Administered 2015-06-05: 5 mg via ORAL
  Filled 2015-06-04: qty 1

## 2015-06-04 MED ORDER — ATORVASTATIN CALCIUM 20 MG PO TABS
20.0000 mg | ORAL_TABLET | Freq: Every day | ORAL | Status: DC
  Administered 2015-06-04: 20 mg via ORAL
  Filled 2015-06-04: qty 1

## 2015-06-04 MED ORDER — SODIUM CHLORIDE 0.9 % IV SOLN: 62.5000 mg | INTRAVENOUS | Status: DC

## 2015-06-04 MED ORDER — HYDROCORTISONE 1 % EX OINT
TOPICAL_OINTMENT | Freq: Two times a day (BID) | CUTANEOUS | Status: DC | PRN
  Administered 2015-06-04: 21:00:00 via TOPICAL
  Filled 2015-06-04: qty 28.35

## 2015-06-04 MED ORDER — CARVEDILOL 25 MG PO TABS
50.0000 mg | ORAL_TABLET | Freq: Two times a day (BID) | ORAL | Status: DC
  Administered 2015-06-04 – 2015-06-05 (×2): 50 mg via ORAL
  Filled 2015-06-04 (×2): qty 2

## 2015-06-04 MED ORDER — RENA-VITE PO TABS
1.0000 | ORAL_TABLET | Freq: Every day | ORAL | Status: DC
  Administered 2015-06-04: 1 via ORAL
  Filled 2015-06-04: qty 1

## 2015-06-04 MED ORDER — ACETAMINOPHEN 325 MG PO TABS: 650.0000 mg | ORAL_TABLET | ORAL | Status: DC | PRN

## 2015-06-04 MED ORDER — VENLAFAXINE HCL 37.5 MG PO TABS
37.5000 mg | ORAL_TABLET | Freq: Two times a day (BID) | ORAL | Status: DC
  Administered 2015-06-04 – 2015-06-05 (×2): 37.5 mg via ORAL
  Filled 2015-06-04 (×4): qty 1

## 2015-06-04 MED ORDER — ISOSORBIDE MONONITRATE ER 30 MG PO TB24
15.0000 mg | ORAL_TABLET | Freq: Every day | ORAL | Status: DC
  Administered 2015-06-04 – 2015-06-05 (×2): 15 mg via ORAL
  Filled 2015-06-04 (×2): qty 1

## 2015-06-04 MED ORDER — MORPHINE SULFATE (PF) 4 MG/ML IV SOLN
4.0000 mg | Freq: Once | INTRAVENOUS | Status: AC
  Administered 2015-06-04: 4 mg via INTRAVENOUS
  Filled 2015-06-04: qty 1

## 2015-06-04 MED ORDER — IPRATROPIUM-ALBUTEROL 0.5-2.5 (3) MG/3ML IN SOLN: 3.0000 mL | Freq: Four times a day (QID) | RESPIRATORY_TRACT | Status: DC | PRN

## 2015-06-04 MED ORDER — ASPIRIN 325 MG PO TABS
325.0000 mg | ORAL_TABLET | Freq: Every day | ORAL | Status: DC
  Administered 2015-06-05: 325 mg via ORAL
  Filled 2015-06-04: qty 1

## 2015-06-04 MED ORDER — HEPARIN SODIUM (PORCINE) 5000 UNIT/ML IJ SOLN
5000.0000 [IU] | Freq: Three times a day (TID) | INTRAMUSCULAR | Status: DC
  Administered 2015-06-04 – 2015-06-05 (×2): 5000 [IU] via SUBCUTANEOUS
  Filled 2015-06-04 (×2): qty 1

## 2015-06-04 MED ORDER — SUCRALFATE 1 GM/10ML PO SUSP
1.0000 g | Freq: Three times a day (TID) | ORAL | Status: DC
  Administered 2015-06-04 – 2015-06-05 (×4): 1 g via ORAL
  Filled 2015-06-04 (×4): qty 10

## 2015-06-04 MED ORDER — PREGABALIN 50 MG PO CAPS: 50.0000 mg | ORAL_CAPSULE | Freq: Every day | ORAL | Status: DC

## 2015-06-04 MED ORDER — PANTOPRAZOLE SODIUM 40 MG PO TBEC
40.0000 mg | DELAYED_RELEASE_TABLET | Freq: Every day | ORAL | Status: DC
  Administered 2015-06-04 – 2015-06-05 (×2): 40 mg via ORAL
  Filled 2015-06-04 (×2): qty 1

## 2015-06-04 MED ORDER — CINACALCET HCL 30 MG PO TABS
90.0000 mg | ORAL_TABLET | Freq: Every day | ORAL | Status: DC
  Administered 2015-06-04: 90 mg via ORAL
  Filled 2015-06-04 (×2): qty 3

## 2015-06-04 MED ORDER — HYDROCORTISONE 0.5 % EX OINT: TOPICAL_OINTMENT | Freq: Two times a day (BID) | CUTANEOUS | Status: DC | PRN

## 2015-06-04 MED ORDER — AMLODIPINE BESYLATE 10 MG PO TABS
10.0000 mg | ORAL_TABLET | Freq: Every day | ORAL | Status: DC
  Administered 2015-06-05: 10 mg via ORAL
  Filled 2015-06-04: qty 1

## 2015-06-04 MED ORDER — ONDANSETRON HCL 4 MG/2ML IJ SOLN: 4.0000 mg | Freq: Four times a day (QID) | INTRAMUSCULAR | Status: DC | PRN

## 2015-06-04 MED ORDER — IPRATROPIUM-ALBUTEROL 20-100 MCG/ACT IN AERS: 2.0000 | INHALATION_SPRAY | Freq: Four times a day (QID) | RESPIRATORY_TRACT | Status: DC | PRN

## 2015-06-04 MED ORDER — PREGABALIN 50 MG PO CAPS
50.0000 mg | ORAL_CAPSULE | Freq: Every day | ORAL | Status: DC
  Administered 2015-06-04: 50 mg via ORAL
  Filled 2015-06-04: qty 1

## 2015-06-04 MED ORDER — NITROGLYCERIN 0.4 MG SL SUBL
0.4000 mg | SUBLINGUAL_TABLET | SUBLINGUAL | Status: DC | PRN
  Administered 2015-06-04 (×3): 0.4 mg via SUBLINGUAL
  Filled 2015-06-04: qty 1

## 2015-06-04 MED ORDER — ACETAMINOPHEN 500 MG PO TABS
500.0000 mg | ORAL_TABLET | Freq: Four times a day (QID) | ORAL | Status: DC | PRN
Start: 2015-06-04 — End: 2015-06-04

## 2015-06-04 MED ORDER — GI COCKTAIL ~~LOC~~: 30.0000 mL | Freq: Four times a day (QID) | ORAL | Status: DC | PRN

## 2015-06-04 MED ORDER — DOXERCALCIFEROL 4 MCG/2ML IV SOLN: 2.0000 ug | INTRAVENOUS | Status: DC

## 2015-06-04 MED ORDER — INSULIN GLARGINE 100 UNIT/ML ~~LOC~~ SOLN
10.0000 [IU] | Freq: Every day | SUBCUTANEOUS | Status: DC
  Administered 2015-06-04: 10 [IU] via SUBCUTANEOUS
  Filled 2015-06-04 (×2): qty 0.1

## 2015-06-04 MED ORDER — AMITRIPTYLINE HCL 25 MG PO TABS
100.0000 mg | ORAL_TABLET | Freq: Every day | ORAL | Status: DC
  Administered 2015-06-04: 100 mg via ORAL
  Filled 2015-06-04: qty 4

## 2015-06-04 MED ORDER — ASPIRIN 81 MG PO CHEW
324.0000 mg | CHEWABLE_TABLET | Freq: Once | ORAL | Status: AC
  Administered 2015-06-04: 324 mg via ORAL
  Filled 2015-06-04: qty 4

## 2015-06-04 MED ORDER — SEVELAMER CARBONATE 800 MG PO TABS
2400.0000 mg | ORAL_TABLET | Freq: Three times a day (TID) | ORAL | Status: DC
  Administered 2015-06-04 – 2015-06-05 (×3): 2400 mg via ORAL
  Filled 2015-06-04 (×3): qty 3

## 2015-06-04 MED ORDER — CLOPIDOGREL BISULFATE 75 MG PO TABS
75.0000 mg | ORAL_TABLET | Freq: Every day | ORAL | Status: DC
  Administered 2015-06-05: 75 mg via ORAL
  Filled 2015-06-04: qty 1

## 2015-06-04 MED ORDER — RANOLAZINE ER 500 MG PO TB12
500.0000 mg | ORAL_TABLET | Freq: Two times a day (BID) | ORAL | Status: DC
  Administered 2015-06-04 – 2015-06-05 (×2): 500 mg via ORAL
  Filled 2015-06-04 (×2): qty 1

## 2015-06-04 NOTE — H&P (Addendum)
[facility] History and Physical  [name] [name] [id number] DOB: October 19, 1955 DOA: 06/04/2015  Referring physician: Dr. Dolly Rias  PCP: [name]   Chief Complaint: Chest pain  HPI: [name] [name] is a 59 y.o. male with positive medical history significant for hypertension, end-stage renal disease on dialysis, diabetes mellitus with nephropathy and insulin-dependent, depression, coronary artery disease and chronic combined systolic/diastolic heart failure; who presented to emergency department secondary to chest pain. Patient reports that pain to be localized in the middle of his chest slightly radiated to the left, on and off and when present lasted up to 30 minutes. He reports the sensation of chest pressure, like someone is standing on his chest. Patient reports some associated nausea/vomiting and clammy sensation when the pain is present. The pain has been present on activity, but also a rest according to the patient. There has not been any fever, chills, unilateral swelling of his legs, hematemesis, melena, hematochezia, abdominal pain, dysuria, hematuria or any other acute complaints. In date the troponins were elevated 0.26 without acute ischemic changes appreciated on his EKG. Patient chest pain improved with the use of morphine and nitroglycerin. Chest x-ray without acute infiltrates. [facility] has been called to admit the patient for further evaluation and treatment of chest pain.  Review of Systems:  Negative except as otherwise mentioned on history of present illness  Past Medical History  Diagnosis Date  . Hypertension   . ESRD on dialysis   . Diabetes mellitus with nephropathy   . Hematochezia     a. 2014: colonscopy, which showed moderately-sized internal hemorrhoids, two 76mm polyps in transverse colon and ascending colon that were resected, five 2-58mm polyps in sigmoid colon, descending colon, transverse colon, and ascending colon that were resected. An upper  endoscopy was performed and showed normal esophagus, stomach, and duodenum.  . Hematuria     a. H/o hematuria 2014 with cystoscopy that was unrevealing for his source of hematuria. He underwent a kidney ultrasound on 10/14 that showed mildly echogenic and scarred kidneys compatible with medical renal disease, without hydronephrosis or renal calculi.  [name] Anemia   . Depression   . CAD (coronary artery disease)     a. per [facility] s/p BMS to mid LAD 12/2009 and DES to mid LAD 10/2010.  . Colon polyps   . Chronic diastolic CHF (congestive heart failure)    Past Surgical History  Procedure Laterality Date  . Left heart catheterization with coronary angiogram N/A 11/23/2014    Procedure: LEFT HEART CATHETERIZATION WITH CORONARY ANGIOGRAM;  Surgeon: Troy Sine, MD;  Location: [facility] CATH LAB;  Service: Cardiovascular;  Laterality: N/A;   Social History:  reports that he quit smoking about 4 years ago. He has never used smokeless tobacco. He reports that he does not drink alcohol or use illicit drugs.  Allergies  Allergen Reactions  . Enalapril Hives    Family History  Problem Relation Age of Onset  . Hypertension    . Bone cancer Mother   . Anuerysm Father   . Diabetes type II Daughter     Prior to Admission medications   Medication Sig Start Date End Date Taking? Authorizing Provider  acetaminophen (TYLENOL) 500 MG tablet Take 500 mg by mouth every 6 (six) hours as needed for headache.    Yes Historical Provider, MD  amitriptyline (ELAVIL) 100 MG tablet Take 1 tablet (100 mg total) by mouth at bedtime. 12/15/14  Yes Geradine Girt, DO  amLODipine (NORVASC) 10 MG tablet  Take 10 mg by mouth daily.   Yes Historical Provider, MD  aspirin 325 MG tablet Take 1 tablet (325 mg total) by mouth daily. 04/03/15  Yes Orson Eva, MD  atorvastatin (LIPITOR) 20 MG tablet Take 20 mg by mouth at bedtime.    Yes Historical Provider, MD  carvedilol (COREG) 25 MG tablet Take 50 mg by mouth 2 (two) times  daily.    Yes Historical Provider, MD  cinacalcet (SENSIPAR) 90 MG tablet Take 90 mg by mouth at bedtime.    Yes Historical Provider, MD  clopidogrel (PLAVIX) 75 MG tablet Take 1 tablet (75 mg total) by mouth daily. 11/24/14  Yes Juluis Mire, MD  Darbepoetin Alfa (ARANESP) 25 MCG/0.42ML SOSY injection Inject 0.42 mLs (25 mcg total) into the vein every Tuesday with hemodialysis. 12/15/14  Yes Geradine Girt, DO  doxercalciferol (HECTOROL) 4 MCG/2ML injection Inject 1 mL (2 mcg total) into the vein Every Tuesday,Thursday,and Saturday with dialysis. 12/15/14  Yes Geradine Girt, DO  ferric gluconate 62.5 mg in sodium chloride 0.9 % 100 mL Inject 62.5 mg into the vein every Thursday with hemodialysis. 12/16/14  Yes Geradine Girt, DO  finasteride (PROSCAR) 5 MG tablet Take 5 mg by mouth daily.   Yes Historical Provider, MD  insulin aspart (NOVOLOG) 100 UNIT/ML injection Inject 15-20 Units into the skin 3 (three) times daily as needed for high blood sugar (CBG >150).    Yes Historical Provider, MD  insulin glargine (LANTUS) 100 UNIT/ML injection Inject 10 Units into the skin at bedtime.   Yes Historical Provider, MD  Ipratropium-Albuterol (COMBIVENT RESPIMAT) 20-100 MCG/ACT AERS respimat Inhale 2 puffs into the lungs every 6 (six) hours as needed for wheezing.   Yes Historical Provider, MD  meclizine (ANTIVERT) 25 MG tablet Take 25 mg by mouth 3 (three) times daily as needed for dizziness.   Yes Historical Provider, MD  multivitamin (RENA-VIT) TABS tablet Take 1 tablet by mouth at bedtime. 12/15/14  Yes Geradine Girt, DO  pregabalin (LYRICA) 50 MG capsule Take 1 capsule (50 mg total) by mouth daily. 12/15/14  Yes Geradine Girt, DO  PRESCRIPTION MEDICATION Take 1 tablet by mouth daily.   Yes Historical Provider, MD  ranolazine (RANEXA) 500 MG 12 hr tablet Take 1 tablet (500 mg total) by mouth 2 (two) times daily. 11/24/14  Yes Juluis Mire, MD  sevelamer carbonate (RENVELA) 800 MG tablet Take 2,400 mg by mouth  3 (three) times daily with meals.    Yes Historical Provider, MD  sucralfate (CARAFATE) 1 GM/10ML suspension Take 10 mLs (1 g total) by mouth 4 (four) times daily -  with meals and at bedtime. Patient taking differently: Take 1 g by mouth at bedtime.  12/15/14  Yes Geradine Girt, DO  venlafaxine (EFFEXOR) 37.5 MG tablet Take 37.5 mg by mouth 2 (two) times daily.   Yes Historical Provider, MD  calcium acetate (PHOSLO) 667 MG capsule Take 2 capsules (1,334 mg total) by mouth 3 (three) times daily with meals. Patient not taking: Reported on 05/16/2015 11/10/14   Sindy Guadeloupe Rivet, MD  ondansetron (ZOFRAN) 4 MG tablet Take 1 tablet (4 mg total) by mouth every 6 (six) hours. Patient not taking: Reported on 05/16/2015 12/02/14   Britt Bottom, NP  pantoprazole (PROTONIX) 40 MG tablet Take 1 tablet (40 mg total) by mouth daily. Patient not taking: Reported on 05/16/2015 11/24/14   Juluis Mire, MD   Physical Exam: Filed Vitals:   06/04/15 1345 06/04/15 1400 06/04/15  1415 06/04/15 1504  BP: 160/91 155/107 163/90 180/98  Pulse: 100 99 99 99  Temp:    97.8 F (36.6 C)  TempSrc:    Oral  Resp: 21 22 18 18   Height:    6' (1.829 m)  Weight:    90 kg (198 lb 6.6 oz)  SpO2: 93% 90% 92% 96%    Wt Readings from Last 3 Encounters:  06/04/15 90 kg (198 lb 6.6 oz)  05/19/15 90 kg (198 lb 6.6 oz)  04/02/15 87.9 kg (193 lb 12.6 oz)    General:  Appears in mild distress and frustrated secondary to ongoing chest discomfort; patient is afebrile, no active nausea or vomiting. Oriented 3 and able to follow commands. Eyes: PERRL, normal lids, irises & conjunctiva, no icterus, no nystagmus ENT: grossly normal hearing, moist mucous membranes, no erythema, no exudates, no tenderness of his ears or nostrils Neck: no LAD, masses or thyromegaly, no JVD Cardiovascular: Regular rate, S1 and S2 appreciated; positive systolic ejection murmur, no rubs, no gallops.  Respiratory: CTA bilaterally, no w/r/r. Normal respiratory  effort. Abdomen: soft, nontender, nondistended, positive bowel sounds Skin: no rash or induration seen on limited exam Musculoskeletal: grossly normal tone BUE/BLE Psychiatric: grossly normal mood and affect, speech fluent and appropriate Neurologic: grossly non-focal.          Labs on Admission:  Basic Metabolic Panel:  Recent Labs Lab 06/04/15 1152  NA 137  K 3.4*  CL 95*  CO2 30  GLUCOSE 86  BUN 16  CREATININE 5.54*  CALCIUM 9.9   Liver Function Tests:  Recent Labs Lab 06/04/15 1152  AST 15  ALT 10*  ALKPHOS 67  BILITOT 0.9  PROT 8.2*  ALBUMIN 4.0   CBC:  Recent Labs Lab 06/04/15 1152  WBC 6.0  HGB 12.2*  HCT 38.2*  MCV 80.6  PLT 178   Cardiac Enzymes:  Recent Labs Lab 06/04/15 1152  TROPONINI 0.26*    BNP (last 3 results)  Recent Labs  11/20/14 2245 12/06/14 0845 12/12/14 2100  BNP 75.6 86.9 816.3*    Radiological Exams on Admission: Dg Chest Port 1 View  06/04/2015   CLINICAL DATA:  Center chest pain, SOB x1 day. Elevated troponin levels. Hx of HTN, CHF, DM, ESRD on dialysis, CAD.Ex-smoker, quit x2 years ago.  EXAM: PORTABLE CHEST - 1 VIEW  COMPARISON:  05/16/2015  FINDINGS: Cardiac silhouette is mildly enlarged. No mediastinal or hilar masses or evidence of adenopathy.  Clear lungs.  No pleural effusion or pneumothorax.  Skeletal structures are unremarkable.  IMPRESSION: No acute cardiopulmonary disease.   Electronically Signed   By: Lajean Manes M.D.   On: 06/04/2015 12:50    EKG:  No acute ischemic changes appreciated, regular rate and rhythm.   Assessment/Plan 1-Chest pain: Atypical presentation with some typical features. Patient with mild elevation of his troponin to 0.26 in setting of end-stage renal disease; and a heart score of 5. He reports relief with the use of morphine and nitroglycerin in the ED. -Patient will be admitted to telemetry -Will cycle troponins and repeat EKG -He has had a recent admission for chest pain  with negative troponin at that time, intermediate risk probability Myoview stress test and also a 2-D echo that demonstrated no new wall motion abnormalities; but demonstrated mild diffuse hypokinesis.  -Will continue beta blockers, statins, aspirin, Plavix and Ranexa and will add imdur -Cardiology has been consulted and will follow further recommendations.  2-ESRD (end stage renal disease): Renal service  has been consulted to manage further needs of hemodialysis.  3-Hypertension: still not at goal -will continue home antihypertensive regimen and will add imdur -Continue low sodium diet  4-CAD -S/P LAD BMS 2011, LAD DES 2012- patent cors Feb 2016: A stable and patent as seen during in February 2016 (6 month ago).  -Will continue aspirin and Plavix -Cardiology is on board will follow any further recommendations.   5-Diabetes mellitus type 2, insulin dependent and with nephropathy complications -Will continue Lantus and use sliding scale insulin -Will check hemoglobin A1c and order has been placed for low carbohydrate diet  6-GERD (gastroesophageal reflux disease): Will continue PPI and Carafate. -PRN GI cocktail also ordered.  7-hyper hyperparathyroidism: Will continue Sensipar and Renvela -Hectorol to be given during hemodialysis -Renal service to adjust medications for hyperparathyroidism as needed  8-depression: Will continue Effexor  Renal service (Dr. Jonnie Finner) Cardiology (Dr. Lovena Le)  Code Status: Full code DVT Prophylaxis: Heparin Family Communication: No family at bedside Disposition Plan: Observation, telemetry bed, LOS < 2 midnights  Time spent: 60 minutes  Barton Dubois [facility] Pager [phone]

## 2015-06-04 NOTE — Consult Note (Signed)
Cardiologist:   Reason for Consult: Elevated Troponin Referring Physician: Samuell Knoble is an 59 y.o. male.  HPI:   This is a 59 y.o. male with a past medical history significant for CAD (s/p BMS to mid LAD 12/2009 and DES to mid LAD 10/2010 per [facility]), HTN, DM, depression, anemia, h/o hematochezia/hematuria in 9150, chronic diastolic CHF, and ESRD on HD.   He had a left heart cath on 11/23/14 revealing no significant coronary obstructive disease with widely patent LAD stent in the proximal to midsegment with minimal 20% smooth in-stent narrowing and mild 20% ostial narrowing of the first diagonal branch within the stented segment. Normal LV function.  echo showed abn EF of 55% with mild MR and grade 1 DD.    We are asked to see for an elevated troponin.  He had a Lexisdcan cardiolite on 05/19/15 which revealed a  medium defect of moderate severity consistent with prior MI.  Considered intermediate risk.  No reversible ischemia.  Patient reports she developed chest pain last night while laying down. Earlier in the day he did walk to the store without any discomfort whatsoever. Its worst it was 7 out of 10 and currently is 3 out of 10. He's had chest pain frequently since his heart catheterization back in February does not occur every day. He also has intermittent nausea and vomiting which is with or without chest pain. He says the pain radiates to his jaw and he was clammy last night.  The patient currently denies fever,  orthopnea, dizziness, PND, cough, congestion, abdominal pain, hematochezia, melena, lower extremity edema, claudication.    Past Medical History  Diagnosis Date  . Hypertension   . ESRD on dialysis   . Diabetes mellitus with nephropathy   . Hematochezia     a. 2014: colonscopy, which showed moderately-sized internal hemorrhoids, two 29m polyps in transverse colon and ascending colon that were resected, five 2-386mpolyps in sigmoid colon, descending colon,  transverse colon, and ascending colon that were resected. An upper endoscopy was performed and showed normal esophagus, stomach, and duodenum.  . Hematuria     a. H/o hematuria 2014 with cystoscopy that was unrevealing for his source of hematuria. He underwent a kidney ultrasound on 10/14 that showed mildly echogenic and scarred kidneys compatible with medical renal disease, without hydronephrosis or renal calculi.  . Marland Kitchennemia   . Depression   . CAD (coronary artery disease)     a. per [facility] s/p BMS to mid LAD 12/2009 and DES to mid LAD 10/2010.  . Colon polyps   . Chronic diastolic CHF (congestive heart failure)     Past Surgical History  Procedure Laterality Date  . Left heart catheterization with coronary angiogram N/A 11/23/2014    Procedure: LEFT HEART CATHETERIZATION WITH CORONARY ANGIOGRAM;  Surgeon: ThTroy SineMD;  [facility];  Service: Cardiovascular;  Laterality: N/A;    Family History  Problem Relation Age of Onset  . Hypertension    . Bone cancer Mother   . Anuerysm Father   . Diabetes type II Daughter     Social History:  reports that he quit smoking about 4 years ago. He has never used smokeless tobacco. He reports that he does not drink alcohol or use illicit drugs.  Allergies:  Allergies  Allergen Reactions  . Enalapril Hives    Medications: Medication Sig  acetaminophen (TYLENOL) 500 MG tablet Take 500 mg by mouth every 6 (six) hours  as needed for headache.   amitriptyline (ELAVIL) 100 MG tablet Take 1 tablet (100 mg total) by mouth at bedtime.  amLODipine (NORVASC) 10 MG tablet Take 10 mg by mouth daily.  aspirin 325 MG tablet Take 1 tablet (325 mg total) by mouth daily.  atorvastatin (LIPITOR) 20 MG tablet Take 20 mg by mouth at bedtime.   carvedilol (COREG) 25 MG tablet Take 50 mg by mouth 2 (two) times daily.   cinacalcet (SENSIPAR) 90 MG tablet Take 90 mg by mouth at bedtime.   clopidogrel (PLAVIX) 75 MG tablet Take 1 tablet (75 mg  total) by mouth daily.  Darbepoetin Alfa (ARANESP) 25 MCG/0.42ML SOSY injection Inject 0.42 mLs (25 mcg total) into the vein every Tuesday with hemodialysis.  doxercalciferol (HECTOROL) 4 MCG/2ML injection Inject 1 mL (2 mcg total) into the vein Every Tuesday,Thursday,and Saturday with dialysis.  ferric gluconate 62.5 mg in sodium chloride 0.9 % 100 mL Inject 62.5 mg into the vein every Thursday with hemodialysis.  finasteride (PROSCAR) 5 MG tablet Take 5 mg by mouth daily.  insulin aspart (NOVOLOG) 100 UNIT/ML injection Inject 15-20 Units into the skin 3 (three) times daily as needed for high blood sugar (CBG >150).   insulin glargine (LANTUS) 100 UNIT/ML injection Inject 10 Units into the skin at bedtime.  Ipratropium-Albuterol (COMBIVENT RESPIMAT) 20-100 MCG/ACT AERS respimat Inhale 2 puffs into the lungs every 6 (six) hours as needed for wheezing.  meclizine (ANTIVERT) 25 MG tablet Take 25 mg by mouth 3 (three) times daily as needed for dizziness.  multivitamin (RENA-VIT) TABS tablet Take 1 tablet by mouth at bedtime.  pregabalin (LYRICA) 50 MG capsule Take 1 capsule (50 mg total) by mouth daily.  PRESCRIPTION MEDICATION Take 1 tablet by mouth daily.  ranolazine (RANEXA) 500 MG 12 hr tablet Take 1 tablet (500 mg total) by mouth 2 (two) times daily.  sevelamer carbonate (RENVELA) 800 MG tablet Take 2,400 mg by mouth 3 (three) times daily with meals.   sucralfate (CARAFATE) 1 GM/10ML suspension Take 10 mLs (1 g total) by mouth 4 (four) times daily -  with meals and at bedtime. Patient taking differently: Take 1 g by mouth at bedtime.   venlafaxine (EFFEXOR) 37.5 MG tablet Take 37.5 mg by mouth 2 (two) times daily.  calcium acetate (PHOSLO) 667 MG capsule Take 2 capsules (1,334 mg total) by mouth 3 (three) times daily with meals. Patient not taking: Reported on 05/16/2015  ondansetron (ZOFRAN) 4 MG tablet Take 1 tablet (4 mg total) by mouth every 6 (six) hours. Patient not taking: Reported on  05/16/2015  pantoprazole (PROTONIX) 40 MG tablet Take 1 tablet (40 mg total) by mouth daily. Patient not taking: Reported on 05/16/2015      Results for orders placed or performed during the hospital encounter of 06/04/15 (from the past 48 hour(s))  Comprehensive metabolic panel     Status: Abnormal   Collection Time: 06/04/15 11:52 AM  Result Value Ref Range   Sodium 137 135 - 145 mmol/L   Potassium 3.4 (L) 3.5 - 5.1 mmol/L   Chloride 95 (L) 101 - 111 mmol/L   CO2 30 22 - 32 mmol/L   Glucose, Bld 86 65 - 99 mg/dL   BUN 16 6 - 20 mg/dL   Creatinine, Ser 5.54 (H) 0.61 - 1.24 mg/dL   Calcium 9.9 8.9 - 10.3 mg/dL   Total Protein 8.2 (H) 6.5 - 8.1 g/dL   Albumin 4.0 3.5 - 5.0 g/dL   AST 15 15 -  41 U/L   ALT 10 (L) 17 - 63 U/L   Alkaline Phosphatase 67 38 - 126 U/L   Total Bilirubin 0.9 0.3 - 1.2 mg/dL   GFR calc non Af Amer 10 (L) >60 mL/min   GFR calc Af Amer 12 (L) >60 mL/min    Comment: (NOTE) The eGFR has been calculated using the CKD EPI equation. This calculation has not been validated in all clinical situations. eGFR's persistently <60 mL/min signify possible Chronic Kidney Disease.    Anion gap 12 5 - 15  CBC     Status: Abnormal   Collection Time: 06/04/15 11:52 AM  Result Value Ref Range   WBC 6.0 4.0 - 10.5 K/uL   RBC 4.74 4.22 - 5.81 MIL/uL   Hemoglobin 12.2 (L) 13.0 - 17.0 g/dL   HCT 38.2 (L) 39.0 - 52.0 %   MCV 80.6 78.0 - 100.0 fL   MCH 25.7 (L) 26.0 - 34.0 pg   MCHC 31.9 30.0 - 36.0 g/dL   RDW 17.9 (H) 11.5 - 15.5 %   Platelets 178 150 - 400 K/uL    Comment: PLATELET COUNT CONFIRMED BY SMEAR  Troponin I     Status: Abnormal   Collection Time: 06/04/15 11:52 AM  Result Value Ref Range   Troponin I 0.26 (H) <0.031 ng/mL    Comment:        PERSISTENTLY INCREASED TROPONIN VALUES IN THE RANGE OF 0.04-0.49 ng/mL CAN BE SEEN IN:       -UNSTABLE ANGINA       -CONGESTIVE HEART FAILURE       -MYOCARDITIS       -CHEST TRAUMA       -ARRYHTHMIAS       -LATE  PRESENTING MYOCARDIAL INFARCTION       -COPD   CLINICAL FOLLOW-UP RECOMMENDED.   I-stat troponin, ED (not at [facility], [facility])     Status: Abnormal   Collection Time: 06/04/15 11:57 AM  Result Value Ref Range   Troponin i, poc 0.19 (HH) 0.00 - 0.08 ng/mL   Comment NOTIFIED PHYSICIAN    Comment 3            Comment: Due to the release kinetics of cTnI, a negative result within the first hours of the onset of symptoms does not rule out myocardial infarction with certainty. If myocardial infarction is still suspected, repeat the test at appropriate intervals.     Dg Chest Port 1 View  06/04/2015   CLINICAL DATA:  Center chest pain, SOB x1 day. Elevated troponin levels. Hx of HTN, CHF, DM, ESRD on dialysis, CAD.Ex-smoker, quit x2 years ago.  EXAM: PORTABLE CHEST - 1 VIEW  COMPARISON:  05/16/2015  FINDINGS: Cardiac silhouette is mildly enlarged. No mediastinal or hilar masses or evidence of adenopathy.  Clear lungs.  No pleural effusion or pneumothorax.  Skeletal structures are unremarkable.  IMPRESSION: No acute cardiopulmonary disease.   Electronically Signed   By: Lajean Manes M.D.   On: 06/04/2015 12:50    Review of Systems  Constitutional: Negative for fever and diaphoresis.  HENT: Negative for congestion.   Respiratory: Positive for cough and shortness of breath.   Cardiovascular: Positive for chest pain. Negative for orthopnea, claudication, leg swelling and PND.  Gastrointestinal: Positive for nausea and vomiting. Negative for abdominal pain, blood in stool and melena.  Musculoskeletal: Positive for myalgias (Lower ext).  Neurological: Negative for dizziness.  All other systems reviewed and are negative.  Blood pressure 180/98, pulse 99, temperature  97.8 F (36.6 C), temperature source Oral, resp. rate 18, height 6' (1.829 m), weight 198 lb 6.6 oz (90 kg), SpO2 96 %. Physical Exam  Nursing note and vitals reviewed. Constitutional: He is oriented to person, place, and time. He  appears well-developed and well-nourished. No distress.  HENT:  Head: Normocephalic and atraumatic.  Eyes: EOM are normal. Pupils are equal, round, and reactive to light. No scleral icterus.  Neck: Normal range of motion. Neck supple.  Cardiovascular: Regular rhythm, S1 normal and S2 normal.  Tachycardia present.   Murmur heard.  Systolic murmur is present with a grade of 1/6  Pulses:      Radial pulses are 2+ on the right side, and 2+ on the left side.       Dorsalis pedis pulses are 2+ on the right side, and 2+ on the left side.  MM in LSB and axillae  Respiratory: Effort normal and breath sounds normal. He has no wheezes. He has no rales.  GI: Soft. Bowel sounds are normal. He exhibits no distension. There is no tenderness.  Musculoskeletal: He exhibits no edema.  Lymphadenopathy:    He has no cervical adenopathy.  Neurological: He is alert and oriented to person, place, and time. He exhibits normal muscle tone.  Skin: Skin is warm and dry.  Psychiatric: He has a normal mood and affect.    Assessment/Plan: Active Problems:   Elevated troponin   Chest pain   ESRD   HTN   DM   Chronic Diastolic dysfunction.   The patient has off and on CP since before his left heart cath in Nov 23, 2014.  The procedure revealed no significant coronary obstructive disease with widely patent LAD stent in the proximal to midsegment with minimal 20% smooth in-stent narrowing and mild 20% ostial narrowing of the first diagonal branch within the stented segment.   Just two weeks ago he had a nonischemic stress test.  The pain is not associated with exertion.  Troponin is elevated to 0.26 in the setting of ESRD and uncontrolled HTN.   This does not sound cardiac although I do not know what to make of the elevated troponin.  It has not been elevated at all the last 20 times it was checked since February.  Nothing acute on EKG.  I do not think further cardiac work is necessary.  BP is poorly controlled.    Continue home meds which include aspirin, Plavix, Amlodipine, Coreg 25 mg, Ranexa, statin.    MD opinion to follow.  Tarri Fuller, [facility] 06/04/2015, 3:42 PM    Cardiology Attending  Patient seen and examined. Agree with the findings as noted by Tarri Fuller above. He has chest pain with known disease but negative cath 6 months ago (patent prior stents) and recent non-ischemic stress test. His blood pressure is not well controlled. I would restart his home meds, and get blood pressure controlled, if necessary with addition of additional medical therapy (clonidine 0.1 mg twice daily). It is reasonable to cycle cardiac enzymes. Would not recommend any additional ischemic evaluation unless the troponin goes over 1-2.   Mikle Bosworth.D.

## 2015-06-04 NOTE — ED Notes (Signed)
Pt. Stated, I started having chest pain last night and it feels like something is sitting on my chest. Pt. Just finished dialysis

## 2015-06-04 NOTE — Progress Notes (Signed)
Had a discussion with patient asking him what his BP is normally.  He stated that it has been running high the last few days.  When asked if he was taking his medications as prescribed, pt stated "no, because I take too many".  Encouraged pt to take medications as prescribed and speak with MD regarding home medications.  SBP 158 at present with no complaints of pain at present from patient.  Will continue to monitor.

## 2015-06-04 NOTE — ED Provider Notes (Signed)
CSN: [id number]     Arrival date & time 06/04/15  1109 History   First MD Initiated Contact with Patient 06/04/15 1134     Chief Complaint  Patient presents with  . Chest Pain     (Consider location/radiation/quality/duration/timing/severity/associated sxs/prior Treatment) HPI    PCP: [name] Blood pressure 164/92, pulse 99, temperature 99.1 F (37.3 C), temperature source Oral, resp. rate 17, height 6' (1.829 m), weight 204 lb (92.534 kg), SpO2 97 %.  [name] [name] is a 59 y.o.male with a significant PMH of hypertension. ESRD, diabetes, hematochezia, hematuria, anemia, depression, CAD, chronic CHF, colon polyps presents to the ER with complaints of chest pain that started at approximately 9:30 this morning after finishing dialysis. He was admitted to the hospital 2 weeks ago for CP, had a 2D echo - which was mildly decreased from previous evaluation. He had a cath done in Feb 2016 which showed no significant findings. He had pain for a brief period last night and it resolved on its own. The pain is described as a pressure to his chest that is a 6/10. It is constant. He has no SOB or lower extremity swelling. Denies fatigue, nausea, vomiting, headaches, fevers, weakness (general or focal), confusion, change of vision,  neck pain, dysphagia, aphagia, chest pain,  back pain, abdominal pains, nausea, vomiting, diarrhea, rash.    Left heart catheterization with coronary angiogram N/A 11/23/2014    Procedure: LEFT HEART CATHETERIZATION WITH CORONARY ANGIOGRAM; Surgeon: Troy Sine, MD; Location: [facility]          Past Medical History  Diagnosis Date  . Hypertension   . ESRD on dialysis   . Diabetes mellitus with nephropathy   . Hematochezia     a. 2014: colonscopy, which showed moderately-sized internal hemorrhoids, two 58mm polyps in transverse colon and ascending colon that were resected, five 2-82mm polyps in sigmoid colon, descending colon, transverse colon, and  ascending colon that were resected. An upper endoscopy was performed and showed normal esophagus, stomach, and duodenum.  . Hematuria     a. H/o hematuria 2014 with cystoscopy that was unrevealing for his source of hematuria. He underwent a kidney ultrasound on 10/14 that showed mildly echogenic and scarred kidneys compatible with medical renal disease, without hydronephrosis or renal calculi.  [name] Anemia   . Depression   . CAD (coronary artery disease)     a. per [facility] s/p BMS to mid LAD 12/2009 and DES to mid LAD 10/2010.  . Colon polyps   . Chronic diastolic CHF (congestive heart failure)    Past Surgical History  Procedure Laterality Date  . Left heart catheterization with coronary angiogram N/A 11/23/2014    Procedure: LEFT HEART CATHETERIZATION WITH CORONARY ANGIOGRAM;  Surgeon: Troy Sine, MD;  Location: [facility] LAB;  Service: Cardiovascular;  Laterality: N/A;   Family History  Problem Relation Age of Onset  . Hypertension    . Bone cancer Mother   . Anuerysm Father   . Diabetes type II Daughter    Social History  Substance Use Topics  . Smoking status: Former Smoker    Quit date: 12/06/2010  . Smokeless tobacco: Never Used  . Alcohol Use: No    Review of Systems  10 Systems reviewed and are negative for acute change except as noted in the HPI.   Allergies  Enalapril  Home Medications   Prior to Admission medications   Medication Sig Start Date End Date Taking? Authorizing Provider  acetaminophen (TYLENOL) 500 MG tablet Take 500 mg by mouth every 6 (six) hours as needed for headache.     Historical Provider, MD  amitriptyline (ELAVIL) 100 MG tablet Take 1 tablet (100 mg total) by mouth at bedtime. 12/15/14   Geradine Girt, DO  amLODipine (NORVASC) 10 MG tablet Take 10 mg by mouth daily.    Historical Provider, MD  aspirin 325 MG tablet Take 1 tablet (325 mg total) by mouth daily. 04/03/15   Orson Eva, MD  atorvastatin (LIPITOR) 20 MG tablet Take 20 mg by  mouth at bedtime.     Historical Provider, MD  calcium acetate (PHOSLO) 667 MG capsule Take 2 capsules (1,334 mg total) by mouth 3 (three) times daily with meals. Patient not taking: Reported on 05/16/2015 11/10/14   Juliet Rude, MD  carvedilol (COREG) 25 MG tablet Take 50 mg by mouth 2 (two) times daily.     Historical Provider, MD  cinacalcet (SENSIPAR) 90 MG tablet Take 90 mg by mouth at bedtime.     Historical Provider, MD  clopidogrel (PLAVIX) 75 MG tablet Take 1 tablet (75 mg total) by mouth daily. 11/24/14   Juluis Mire, MD  Darbepoetin Alfa (ARANESP) 25 MCG/0.42ML SOSY injection Inject 0.42 mLs (25 mcg total) into the vein every Tuesday with hemodialysis. 12/15/14   Geradine Girt, DO  doxercalciferol (HECTOROL) 4 MCG/2ML injection Inject 1 mL (2 mcg total) into the vein Every Tuesday,Thursday,and Saturday with dialysis. 12/15/14   Geradine Girt, DO  ferric gluconate 62.5 mg in sodium chloride 0.9 % 100 mL Inject 62.5 mg into the vein every Thursday with hemodialysis. 12/16/14   Geradine Girt, DO  finasteride (PROSCAR) 5 MG tablet Take 5 mg by mouth daily.    Historical Provider, MD  insulin aspart (NOVOLOG) 100 UNIT/ML injection Inject 15-20 Units into the skin 3 (three) times daily as needed for high blood sugar (CBG >150).     Historical Provider, MD  insulin glargine (LANTUS) 100 UNIT/ML injection Inject 10 Units into the skin at bedtime.    Historical Provider, MD  Ipratropium-Albuterol (COMBIVENT RESPIMAT) 20-100 MCG/ACT AERS respimat Inhale 2 puffs into the lungs every 6 (six) hours as needed for wheezing.    Historical Provider, MD  meclizine (ANTIVERT) 25 MG tablet Take 25 mg by mouth 3 (three) times daily as needed for dizziness.    Historical Provider, MD  multivitamin (RENA-VIT) TABS tablet Take 1 tablet by mouth at bedtime. 12/15/14   Geradine Girt, DO  ondansetron (ZOFRAN) 4 MG tablet Take 1 tablet (4 mg total) by mouth every 6 (six) hours. Patient not taking: Reported on 05/16/2015  12/02/14   Britt Bottom, NP  pantoprazole (PROTONIX) 40 MG tablet Take 1 tablet (40 mg total) by mouth daily. Patient not taking: Reported on 05/16/2015 11/24/14   Juluis Mire, MD  pregabalin (LYRICA) 50 MG capsule Take 1 capsule (50 mg total) by mouth daily. 12/15/14   Geradine Girt, DO  ranolazine (RANEXA) 500 MG 12 hr tablet Take 1 tablet (500 mg total) by mouth 2 (two) times daily. 11/24/14   Juluis Mire, MD  sevelamer carbonate (RENVELA) 800 MG tablet Take 2,400 mg by mouth 3 (three) times daily with meals.     Historical Provider, MD  sucralfate (CARAFATE) 1 GM/10ML suspension Take 10 mLs (1 g total) by mouth 4 (four) times daily -  with meals and at bedtime. Patient taking differently: Take 1 g by mouth at bedtime.  12/15/14  Geradine Girt, DO  venlafaxine (EFFEXOR) 37.5 MG tablet Take 37.5 mg by mouth 2 (two) times daily.    Historical Provider, MD   BP 148/80 mmHg  Pulse 94  Temp(Src) 99.1 F (37.3 C) (Oral)  Resp 23  Ht 6' (1.829 m)  Wt 204 lb (92.534 kg)  BMI 27.66 kg/m2  SpO2 94% Physical Exam  Constitutional: He appears well-developed and well-nourished. No distress.  HENT:  Head: Normocephalic and atraumatic.  Eyes: Pupils are equal, round, and reactive to light.  Neck: Normal range of motion. Neck supple.  Cardiovascular: Normal rate and regular rhythm.   Pulmonary/Chest: Effort normal.  Pain to chest not made with with palpation of chest wall.  Abdominal: Soft.  Musculoskeletal:  No lower extremity swelling.  Neurological: He is alert.  Skin: Skin is warm and dry. He is not diaphoretic.  Nursing note and vitals reviewed.   ED Course  Procedures (including critical care time) Labs Review Labs Reviewed  COMPREHENSIVE METABOLIC PANEL - Abnormal; Notable for the following:    Potassium 3.4 (*)    Chloride 95 (*)    Creatinine, Ser 5.54 (*)    Total Protein 8.2 (*)    ALT 10 (*)    GFR calc non Af Amer 10 (*)    GFR calc Af Amer 12 (*)    All other  components within normal limits  CBC - Abnormal; Notable for the following:    Hemoglobin 12.2 (*)    HCT 38.2 (*)    MCH 25.7 (*)    RDW 17.9 (*)    All other components within normal limits  TROPONIN I - Abnormal; Notable for the following:    Troponin I 0.26 (*)    All other components within normal limits  I-STAT TROPOININ, ED - Abnormal; Notable for the following:    Troponin i, poc 0.19 (*)    All other components within normal limits    Imaging Review Dg Chest Port 1 View  06/04/2015   CLINICAL DATA:  Center chest pain, SOB x1 day. Elevated troponin levels. Hx of HTN, CHF, DM, ESRD on dialysis, CAD.Ex-smoker, quit x2 years ago.  EXAM: PORTABLE CHEST - 1 VIEW  COMPARISON:  05/16/2015  FINDINGS: Cardiac silhouette is mildly enlarged. No mediastinal or hilar masses or evidence of adenopathy.  Clear lungs.  No pleural effusion or pneumothorax.  Skeletal structures are unremarkable.  IMPRESSION: No acute cardiopulmonary disease.   Electronically Signed   By: Lajean Manes M.D.   On: 06/04/2015 12:50   I have personally reviewed and evaluated these images and lab results as part of my medical decision-making.   EKG Interpretation None      MDM   Final diagnoses:  Chest pain, unspecified chest pain type  Elevated troponin    I spoke with Dr. Lovena Le (cardiology at 12:34 pm regarding the positive Troponin. Given his recent cardiac cath and not significant 2D echo recently he does not feel that cardiology can add anything to the case. He recommends medical admission for CP r/o.  The patients second Troponin has come back at 0.26, I spoke with Dr. Dyann Kief with [facility] about admission. He called cardiology and they have agreed to consult- Medicine will admit.  Inpatient, Telemetry, [facility] Admits  Patient made aware of results and plan for readmission. He continues to complain of pain to his chest, will order another dose of Morphine, IV.  Filed Vitals:   06/04/15 1315  BP:  148/80  Pulse: 94  Temp:   Resp: [address], PA-C 06/04/15 1334  Gareth Morgan, MD 06/06/15 1021

## 2015-06-05 DIAGNOSIS — R0789 Other chest pain: Secondary | ICD-10-CM | POA: Diagnosis not present

## 2015-06-05 DIAGNOSIS — M940 Chondrocostal junction syndrome [Tietze]: Secondary | ICD-10-CM

## 2015-06-05 DIAGNOSIS — I10 Essential (primary) hypertension: Secondary | ICD-10-CM | POA: Diagnosis not present

## 2015-06-05 DIAGNOSIS — J9610 Chronic respiratory failure, unspecified whether with hypoxia or hypercapnia: Secondary | ICD-10-CM

## 2015-06-05 DIAGNOSIS — I150 Renovascular hypertension: Secondary | ICD-10-CM | POA: Diagnosis not present

## 2015-06-05 DIAGNOSIS — R7989 Other specified abnormal findings of blood chemistry: Secondary | ICD-10-CM | POA: Diagnosis not present

## 2015-06-05 DIAGNOSIS — I208 Other forms of angina pectoris: Secondary | ICD-10-CM | POA: Diagnosis not present

## 2015-06-05 DIAGNOSIS — K219 Gastro-esophageal reflux disease without esophagitis: Secondary | ICD-10-CM

## 2015-06-05 LAB — GLUCOSE, CAPILLARY
GLUCOSE-CAPILLARY: 109 mg/dL — AB (ref 65–99)
Glucose-Capillary: 114 mg/dL — ABNORMAL HIGH (ref 65–99)

## 2015-06-05 LAB — BASIC METABOLIC PANEL
Anion gap: 11 (ref 5–15)
BUN: 29 mg/dL — ABNORMAL HIGH (ref 6–20)
CALCIUM: 9.3 mg/dL (ref 8.9–10.3)
CO2: 30 mmol/L (ref 22–32)
CREATININE: 7.63 mg/dL — AB (ref 0.61–1.24)
Chloride: 95 mmol/L — ABNORMAL LOW (ref 101–111)
GFR calc Af Amer: 8 mL/min — ABNORMAL LOW (ref >60)
GFR calc non Af Amer: 7 mL/min — ABNORMAL LOW (ref >60)
GLUCOSE: 123 mg/dL — AB (ref 65–99)
Potassium: 4.6 mmol/L (ref 3.5–5.1)
Sodium: 136 mmol/L (ref 135–145)

## 2015-06-05 LAB — CBC
HEMATOCRIT: 34 % — AB (ref 39.0–52.0)
Hemoglobin: 10.9 g/dL — ABNORMAL LOW (ref 13.0–17.0)
MCH: 26 pg (ref 26.0–34.0)
MCHC: 32.1 g/dL (ref 30.0–36.0)
MCV: 81 fL (ref 78.0–100.0)
Platelets: 142 10*3/uL — ABNORMAL LOW (ref 150–400)
RBC: 4.2 MIL/uL — ABNORMAL LOW (ref 4.22–5.81)
RDW: 17.9 % — AB (ref 11.5–15.5)
WBC: 5.2 10*3/uL (ref 4.0–10.5)

## 2015-06-05 LAB — TROPONIN I: Troponin I: 0.17 ng/mL — ABNORMAL HIGH (ref <0.031)

## 2015-06-05 MED ORDER — PANTOPRAZOLE SODIUM 40 MG PO TBEC: 40.0000 mg | DELAYED_RELEASE_TABLET | Freq: Every day | ORAL | Status: DC

## 2015-06-05 MED ORDER — LIDOCAINE 5 % EX PTCH
1.0000 | MEDICATED_PATCH | CUTANEOUS | Status: DC
  Administered 2015-06-05: 1 via TRANSDERMAL
  Filled 2015-06-05: qty 1

## 2015-06-05 MED ORDER — LIDOCAINE 5 % EX PTCH: 1.0000 | MEDICATED_PATCH | CUTANEOUS | Status: DC

## 2015-06-05 MED ORDER — ISOSORBIDE MONONITRATE ER 30 MG PO TB24: 15.0000 mg | ORAL_TABLET | Freq: Every day | ORAL | Status: DC

## 2015-06-05 MED ORDER — NITROGLYCERIN 0.4 MG SL SUBL: 0.4000 mg | SUBLINGUAL_TABLET | SUBLINGUAL | Status: DC | PRN

## 2015-06-05 NOTE — Care Management Note (Signed)
Case Management Note  Patient Details  Name: Sakib Bradway MRN: [id number] Date of Birth: Jan 12, 1956  Subjective/Objective:                   Chest pain Action/Plan:  Discharge planning Expected Discharge Date:  06/05/15              Expected Discharge Plan:  Home/Self Care  In-House Referral:     Discharge planning Services  CM Consult  Post Acute Care Choice:    Choice offered to:     DME Arranged:  Oxygen DME Agency:  [place]:    [facility] Agency:     Status of Service:  Completed, signed off  Medicare Important Message Given:    Date Medicare IM Given:    Medicare IM give by:    Date Additional Medicare IM Given:    Additional Medicare Important Message give by:     If discussed at [place] of Stay Meetings, dates discussed:    Additional Comments: CM received call from RN to please arrange for home oxygen.  CM called [facility] DME rep, Merry Proud to please deliver the oxygen to room so pt can discharge.  CM called CSW for a taxi voucher so pt can get home.  No other CM needs were communicated.   Dellie Catholic, RN 06/05/2015, 2:41 PM

## 2015-06-05 NOTE — Progress Notes (Signed)
Patient ID: [name] [name], male   DOB: Feb 27, 1956, 59 y.o.   MRN: [id number]    Patient Name: [name] [name] Date of Encounter: 06/05/2015     Active Problems:   Chest pain   ESRD (end stage renal disease)   Hypertension   CAD -S/P LAD BMS 2011, LAD DES 2012- patent cors Feb 2016   Diabetes mellitus type 2, insulin dependent   Elevated troponin   GERD (gastroesophageal reflux disease)   Pain in the chest    SUBJECTIVE  Minimal chest pain.   CURRENT MEDS . amitriptyline  100 mg Oral QHS  . amLODipine  10 mg Oral Daily  . aspirin  325 mg Oral Daily  . atorvastatin  20 mg Oral QHS  . carvedilol  50 mg Oral BID  . cinacalcet  90 mg Oral Q supper  . clopidogrel  75 mg Oral Daily  . [START ON 06/07/2015] doxercalciferol  2 mcg Intravenous Q T,Th,Sa-HD  . [START ON 06/09/2015] ferric gluconate (FERRLECIT/NULECIT) IV  62.5 mg Intravenous Q Thu-HD  . finasteride  5 mg Oral Daily  . heparin  5,000 Units Subcutaneous 3 times per day  . insulin glargine  10 Units Subcutaneous QHS  . isosorbide mononitrate  15 mg Oral Daily  . lidocaine  1 patch Transdermal Q24H  . multivitamin  1 tablet Oral QHS  . pantoprazole  40 mg Oral Daily  . pregabalin  50 mg Oral QHS  . ranolazine  500 mg Oral BID  . sevelamer carbonate  2,400 mg Oral TID WC  . sucralfate  1 g Oral TID WC & HS  . venlafaxine  37.5 mg Oral BID    OBJECTIVE  Filed Vitals:   06/05/15 0013 06/05/15 0440 06/05/15 0514 06/05/15 0915  BP:  121/76 123/76 134/86  Pulse:  83 73 80  Temp: 97.7 F (36.5 C)  97.5 F (36.4 C)   TempSrc:      Resp:    16  Height:      Weight:   197 lb (89.359 kg)   SpO2:  97% 98% 99%    Intake/Output Summary (Last 24 hours) at 06/05/15 1034 Last data filed at 06/05/15 0900  Gross per 24 hour  Intake    120 ml  Output      0 ml  Net    120 ml   Filed Weights   06/04/15 1120 06/04/15 1504 06/05/15 0514  Weight: 204 lb (92.534 kg) 198 lb 6.6 oz (90 kg) 197 lb (89.359 kg)    PHYSICAL  EXAM  General: Pleasant, NAD. Neuro: Alert and oriented X 3. Moves all extremities spontaneously. Psych: Normal affect. HEENT:  Normal  Neck: Supple without bruits or JVD. Lungs:  Resp regular and unlabored, CTA. Heart: RRR no s3, s4, or murmurs. Abdomen: Soft, non-tender, non-distended, BS + x 4.  Extremities: No clubbing, cyanosis or edema. DP/PT/Radials 2+ and equal bilaterally.  Accessory Clinical Findings  CBC  Recent Labs  06/04/15 1152 06/05/15 0506  WBC 6.0 5.2  HGB 12.2* 10.9*  HCT 38.2* 34.0*  MCV 80.6 81.0  PLT 178 [id number]*   Basic Metabolic Panel  Recent Labs  06/04/15 1152 06/05/15 0506  NA 137 136  K 3.4* 4.6  CL 95* 95*  CO2 30 30  GLUCOSE 86 123*  BUN 16 29*  CREATININE 5.54* 7.63*  CALCIUM 9.9 9.3   Liver Function Tests  Recent Labs  06/04/15 1152  AST 15  ALT 10*  ALKPHOS 67  BILITOT  0.9  PROT 8.2*  ALBUMIN 4.0   No results for input(s): LIPASE, AMYLASE in the last 72 hours. Cardiac Enzymes  Recent Labs  06/04/15 1817 06/04/15 2245 06/05/15 0506  TROPONINI 0.15* 0.19* 0.17*   BNP Invalid input(s): POCBNP D-Dimer No results for input(s): DDIMER in the last 72 hours. Hemoglobin A1C No results for input(s): HGBA1C in the last 72 hours. Fasting Lipid Panel No results for input(s): CHOL, HDL, LDLCALC, TRIG, CHOLHDL, LDLDIRECT in the last 72 hours. Thyroid Function Tests  Recent Labs  06/04/15 1817  TSH 2.098    TELE  nsr  Radiology/Studies  Ct Chest Wo Contrast  05/16/2015   CLINICAL DATA:  Chest pain and recent weight loss  EXAM: CT CHEST WITHOUT CONTRAST  TECHNIQUE: Multidetector CT imaging of the chest was performed following the standard protocol without IV contrast.  COMPARISON:  Chest x-ray from earlier in the same day  FINDINGS: The lungs are well aerated bilaterally. The focal infiltrate or sizable effusion is seen. No parenchymal nodules are noted.  The thoracic inlet is within normal limits. Aortic atherosclerotic  calcifications are seen without aneurysmal dilatation. Heavy coronary calcifications are noted. No significant hilar or mediastinal adenopathy is noted. A few small lymph nodes are noted in the pretracheal region but demonstrate normal fatty hila.  The upper abdomen shows no acute abnormality. No acute bony abnormality is noted.  IMPRESSION: Aortic atherosclerotic disease and heavy coronary calcifications.  No acute abnormality is identified.   Electronically Signed   By: Inez Catalina M.D.   On: 05/16/2015 13:26   Ct Angio Chest Pe W/cm &/or Wo Cm  05/17/2015   CLINICAL DATA:  Chest pain, shortness of breath.  EXAM: CT ANGIOGRAPHY CHEST WITH CONTRAST  TECHNIQUE: Multidetector CT imaging of the chest was performed using the standard protocol during bolus administration of intravenous contrast. Multiplanar CT image reconstructions and MIPs were obtained to evaluate the vascular anatomy.  CONTRAST:  124mL OMNIPAQUE IOHEXOL 350 MG/ML SOLN  COMPARISON:  CT scan of May 16, 2015.  FINDINGS: No pneumothorax or pleural effusion is noted. No acute pulmonary disease is noted. No definite evidence of pulmonary embolus is noted. Atherosclerosis of thoracic aorta is noted without aneurysm formation. Visualized portion of upper abdomen is unremarkable. No mediastinal mass or adenopathy is noted. No significant osseous abnormality is noted.  Review of the MIP images confirms the above findings.  IMPRESSION: No definite evidence of pulmonary embolus. No acute cardiopulmonary abnormality seen.   Electronically Signed   By: Marijo Conception, M.D.   On: 05/17/2015 17:54   Nm Myocar Multi W/spect W/wall Motion / Ef  05/19/2015    There was no ST segment deviation noted during stress.  Defect 1: There is a medium defect of moderate severity.  Findings consistent with prior myocardial infarction.  This is an intermediate risk study.  Nuclear stress EF: 42%.   Intermediate risk study. There moderate sized and intensity fixed,  mid to  apical inferolateral wall defect consistent with scar. No significant  reversible ischemia is noted. LVEF 42% with inferolateral hypokinesis.   Dg Chest Port 1 View  06/04/2015   CLINICAL DATA:  Center chest pain, SOB x1 day. Elevated troponin levels. Hx of HTN, CHF, DM, ESRD on dialysis, CAD.Ex-smoker, quit x2 years ago.  EXAM: PORTABLE CHEST - 1 VIEW  COMPARISON:  05/16/2015  FINDINGS: Cardiac silhouette is mildly enlarged. No mediastinal or hilar masses or evidence of adenopathy.  Clear lungs.  No pleural effusion or pneumothorax.  Skeletal structures are unremarkable.  IMPRESSION: No acute cardiopulmonary disease.   Electronically Signed   By: Lajean Manes M.D.   On: 06/04/2015 12:50   Dg Chest Portable 1 View  05/16/2015   CLINICAL DATA:  59 year old hypertensive diabetic male on dialysis presenting with 3 hours chest pain. Subsequent encounter.  EXAM: PORTABLE CHEST - 1 VIEW  COMPARISON:  03/31/2015  FINDINGS: Chronically elevated left hemidiaphragm.  Cardiomegaly with pulmonary vascular congestion.  No segmental consolidation or gross pneumothorax.  IMPRESSION: Cardiomegaly with pulmonary vascular congestion.   Electronically Signed   By: Genia Del M.D.   On: 05/16/2015 09:05    ASSESSMENT AND PLAN  1. Chest pain 2. Uncontrolled HTN 3. ESRD on HD Rec: note troponins remain slightly elevated. Blood pressure is better. [place] for discharge from my perspective. We will sign off.  Roschelle Calandra,M.D.  06/05/2015 10:34 AM

## 2015-06-05 NOTE — Discharge Instructions (Signed)

## 2015-06-05 NOTE — Discharge Summary (Addendum)
Physician Discharge Summary  Niguel Moure ENI:[id number] DOB: [birth year] DOA: 06/04/2015  PCP: [name]  Admit date: 06/04/2015 Discharge date: 06/05/2015  Recommendations for Outpatient Follow-up:  1. PCP for ongoing medical management of chest pains and encourage of medication compliance.  Consider ESR to screen for vasculitis.   2. Started 2L home O2 3. Started imdur, resumed PPI  Discharge Diagnoses:  Principal Problem:   Hypertensive urgency Active Problems:   Chest pain   ESRD (end stage renal disease)   Hypertension   CAD -S/P LAD BMS 2011, LAD DES 2012- patent cors Feb 2016   Diabetes mellitus type 2, insulin dependent   Angina of effort   Elevated troponin   GERD (gastroesophageal reflux disease)   Costochondritis   Chronic respiratory failure   Discharge Condition:  Stable, ongoing chronic chest pain  Diet recommendation: renal/diabetic  Wt Readings from Last 3 Encounters:  06/05/15 89.359 kg (197 lb)  05/19/15 90 kg (198 lb 6.6 oz)  04/02/15 87.9 kg (193 lb 12.6 oz)    History of present illness:  [name] [name] is a 59 y.o. male with history of hypertension, end-stage renal disease on dialysis, insulin-dependent diabetes mellitus with nephropathy, depression, coronary artery disease, and chronic combined systolic/diastolic heart failure, chronic chest pains who presented to emergency department secondary to chest pain. Patient reports that pain to be localized in the middle of his chest slightly radiated to the left, on and off and when present lasted up to 30 minutes. He reports the sensation of chest pressure, like someone is standing on his chest. Patient reported some associated nausea/vomiting and clammy sensation when the pain is present. The pain has been present with activity and at rest.  Troponins was elevated to 0.26 without acute ischemic changes appreciated on his EKG. His chest pain improved with the use of morphine and nitroglycerin.  Chest x-ray  without acute infiltrates.  Hospital Course:   Ongoing atypical chest pain, possibly exacerbated by hypertensive urgency from medication noncompliance and possibly GERD.  He also appears to have some mild costochondritis along the left sternocostal joints.  The patient has already had an extensive workup for this chest pain. He underwent left heart catheterization on 11/23/2014 which demonstrated no significant coronary artery obstruction and a widely patent LAD stent in the proximal to midsegment with a minimal 20% smooth in-stent narrowing and a mild 20% ostial narrowing of the first diagonal branch. His echocardiogram demonstrated an ejection fraction of 55% with mild MR and grade 1 diastolic dysfunction. He has had several emergency department visits and hospitalizations for similar pain in the meantime. He had a stress test on 05/19/15 which revealed a medium defect of moderate severity consistent with prior MI but no reversible ischemia.  He had a CT anterior chest to exclude PE on 05/17/2015 which was negative.  It is reported in previous notes that he had some relief with PPI and Carafate but he has not been compliant with his PPI and has not been taking the carafate as prescribed. Because of his end-stage renal disease, he should not stay on indefinite Carafate secondary to aluminum toxicity.  I have tried to address his pain by emphasizing compliance with his medications, resuming his PPI, providing a lidocaine patch to apply to his chest, and adding Imdur to his existing cardiac medications.  Although his troponins were minimally elevated, this was in the setting of end-stage renal disease and elevated BPs.  Telemetry demonstrated NSR.  Cardiology was consulted, but since his  EKG remained unchanged and he had recently had a negative stress test, they felt that his elevated troponins were secondary to his blood pressure being elevated. The patient admitted noncompliance with his blood pressure medications  and after his blood pressure medications were resumed, his blood pressure trended down to normal limits.  He continued beta blockers, statins, aspirin, Plavix and Ranexa.  Did not RX prednisone due to DM or NSAIDS due to CAD.    ESRD (end stage renal disease), resume outpatient hemodialysis.  Hypertensive urgency secondary to medication noncompliance, blood pressure trended down from 188/97 11/08/1932/86 by resuming his home medications and adding Imdur.    CAD -S/P LAD BMS 2011, LAD DES 2012- patent cors Feb 2016: A stable and patent as seen during in February 2016 (6 month ago). Continued aspirin, Plavix, statin, beta blocker.  Chronic hypoxic respiratory failure likely secondary to ESRD, CAD.  Started 2L home O2.  Diabetes mellitus type 2, last A1c 5.7 in 03/2015 and CBG low during hospitalization on lantus.  Advised him to STOP his insulin and check his CBG once daily in the mornings and if persistently > 200, he should talk to his doctor.    GERD (gastroesophageal reflux disease):  Restarted PPI (patient had not been taking) and stopped Carafate due to risk of Aluminum toxicity.    Secondary hyperparathyroidism, continued Sensipar and Renvela  Depression, likely exacerbating his symptoms of pain, continued Effexor  70-lb unexplained weight loss.  Consider ESR.  PPD was recently negative at HD.  HIV NR.  No evidence of malignancy on CT.  Agree with GI referral.  Nutrition has previously met with the patient to discuss weight maintenance strategies.  Procedures:  Chest x-ray  Consultations:  Cardiology  Discharge Exam: Filed Vitals:   06/05/15 0915  BP: 134/86  Pulse: 80  Temp:   Resp: 16   Filed Vitals:   06/05/15 0013 06/05/15 0440 06/05/15 0514 06/05/15 0915  BP:  121/76 123/76 134/86  Pulse:  83 73 80  Temp: 97.7 F (36.5 C)  97.5 F (36.4 C)   TempSrc:      Resp:    16  Height:      Weight:   89.359 kg (197 lb)   SpO2:  97% 98% 99%    General: Adult male, no  acute distress Cardiovascular: Regular rate and rhythm, soft 2/6 murmur at the left sternal border Respiratory: Clear to auscultation bilaterally Abdomen: NABS, soft, nondistended, nontender MSK: Normal tone and bulk, no lower extremity edema, left arm fistula with bruit and thrill  Discharge Instructions      Discharge Instructions    Call MD for:  difficulty breathing, headache or visual disturbances    Complete by:  As directed      Call MD for:  extreme fatigue    Complete by:  As directed      Call MD for:  hives    Complete by:  As directed      Call MD for:  persistant dizziness or light-headedness    Complete by:  As directed      Call MD for:  persistant nausea and vomiting    Complete by:  As directed      Call MD for:  severe uncontrolled pain    Complete by:  As directed      Call MD for:  temperature >100.4    Complete by:  As directed      Diet - low sodium heart healthy  Complete by:  As directed      Diet Carb Modified    Complete by:  As directed      Discharge instructions    Complete by:  As directed   You were hospitalized with chest pain.  I have tried to address the three things which could be causing you to have pain:  Heart-related pain, stomach irritation, and inflammation of the cartilage between your breast bone and your ribs.  For your heart, please take your blood pressure medications.  You will have worse chest pain when your blood pressure is higher.  I have added a medication call imdur which helps open up the arteries of the heart sort of like long-acting nitroglycerin.  I have also given you a prescription for nitroglycerin to use as needed.  For your stomach, you can stop taking the carafate.  I have given you a new prescription for protonix to use instead.  For the cartilage inflammation, try using a lidocaine patch on that spot.  Your blood sugars are low and your diabetes is very well controlled.  Just stop your insulin for now and check your  blood sugar once a day in the morning.  If your blood sugars are consistently greater than 200, please call your doctor.  Please continue your dialysis and follow up with gastroenterology as soon as possible.     Increase activity slowly    Complete by:  As directed             Medication List    STOP taking these medications        calcium acetate 667 MG capsule  Commonly known as:  PHOSLO     insulin aspart 100 UNIT/ML injection  Commonly known as:  novoLOG     insulin glargine 100 UNIT/ML injection  Commonly known as:  LANTUS     ondansetron 4 MG tablet  Commonly known as:  ZOFRAN     PRESCRIPTION MEDICATION     sucralfate 1 GM/10ML suspension  Commonly known as:  CARAFATE      TAKE these medications        acetaminophen 500 MG tablet  Commonly known as:  TYLENOL  Take 500 mg by mouth every 6 (six) hours as needed for headache.     amitriptyline 100 MG tablet  Commonly known as:  ELAVIL  Take 1 tablet (100 mg total) by mouth at bedtime.     amLODipine 10 MG tablet  Commonly known as:  NORVASC  Take 10 mg by mouth daily.     aspirin 325 MG tablet  Take 1 tablet (325 mg total) by mouth daily.     atorvastatin 20 MG tablet  Commonly known as:  LIPITOR  Take 20 mg by mouth at bedtime.     carvedilol 25 MG tablet  Commonly known as:  COREG  Take 50 mg by mouth 2 (two) times daily.     cinacalcet 90 MG tablet  Commonly known as:  SENSIPAR  Take 90 mg by mouth at bedtime.     clopidogrel 75 MG tablet  Commonly known as:  PLAVIX  Take 1 tablet (75 mg total) by mouth daily.     COMBIVENT RESPIMAT 20-100 MCG/ACT Aers respimat  Generic drug:  Ipratropium-Albuterol  Inhale 2 puffs into the lungs every 6 (six) hours as needed for wheezing.     Darbepoetin Alfa 25 MCG/0.42ML Sosy injection  Commonly known as:  ARANESP  Inject 0.42 mLs (25 mcg total) into  the vein every Tuesday with hemodialysis.     doxercalciferol 4 MCG/2ML injection  Commonly known as:   HECTOROL  Inject 1 mL (2 mcg total) into the vein Every Tuesday,Thursday,and Saturday with dialysis.     ferric gluconate 62.5 mg in sodium chloride 0.9 % 100 mL  Inject 62.5 mg into the vein every Thursday with hemodialysis.     finasteride 5 MG tablet  Commonly known as:  PROSCAR  Take 5 mg by mouth daily.     isosorbide mononitrate 30 MG 24 hr tablet  Commonly known as:  IMDUR  Take 0.5 tablets (15 mg total) by mouth daily.     lidocaine 5 %  Commonly known as:  LIDODERM  Place 1 patch onto the skin daily. Remove & Discard patch within 12 hours or as directed by MD     meclizine 25 MG tablet  Commonly known as:  ANTIVERT  Take 25 mg by mouth 3 (three) times daily as needed for dizziness.     multivitamin Tabs tablet  Take 1 tablet by mouth at bedtime.     nitroGLYCERIN 0.4 MG SL tablet  Commonly known as:  NITROSTAT  Place 1 tablet (0.4 mg total) under the tongue every 5 (five) minutes as needed for chest pain.     pantoprazole 40 MG tablet  Commonly known as:  PROTONIX  Take 1 tablet (40 mg total) by mouth daily.     pregabalin 50 MG capsule  Commonly known as:  LYRICA  Take 1 capsule (50 mg total) by mouth daily.     ranolazine 500 MG 12 hr tablet  Commonly known as:  RANEXA  Take 1 tablet (500 mg total) by mouth 2 (two) times daily.     sevelamer carbonate 800 MG tablet  Commonly known as:  RENVELA  Take 2,400 mg by mouth 3 (three) times daily with meals.     venlafaxine 37.5 MG tablet  Commonly known as:  EFFEXOR  Take 37.5 mg by mouth 2 (two) times daily.       Follow-up Information    Follow up with [facility]. Schedule an appointment as soon as possible for a visit in 1 month.   Contact information:   [place] [address] [phone]      Follow up with [facility]    . Schedule an appointment as soon as possible for a visit in 1  month.   Contact information:   [address] [place] [address] [phone]      Follow up with [facility] In 1 month.   Contact information:   [place] [address] [phone]       Follow up with [facility].   Why:  home oxygen   Contact information:   [address] [place] [address] [phone]        The results of significant diagnostics from this hospitalization (including imaging, microbiology, ancillary and laboratory) are listed below for reference.    Significant Diagnostic Studies: Ct Chest Wo Contrast  05/16/2015   CLINICAL DATA:  Chest pain and recent weight loss  EXAM: CT CHEST WITHOUT CONTRAST  TECHNIQUE: Multidetector CT imaging of the chest was performed following the standard protocol without IV contrast.  COMPARISON:  Chest x-ray from earlier in the same day  FINDINGS: The lungs are well aerated bilaterally. The  focal infiltrate or sizable effusion is seen. No parenchymal nodules are noted.  The thoracic inlet is within normal limits. Aortic atherosclerotic calcifications are seen without aneurysmal dilatation. Heavy coronary calcifications are noted. No significant hilar or mediastinal adenopathy is noted. A few small lymph nodes are noted in the pretracheal region but demonstrate normal fatty hila.  The upper abdomen shows no acute abnormality. No acute bony abnormality is noted.  IMPRESSION: Aortic atherosclerotic disease and heavy coronary calcifications.  No acute abnormality is identified.   Electronically Signed   By: Inez Catalina M.D.   On: 05/16/2015 13:26   Ct Angio Chest Pe W/cm &/or Wo Cm  05/17/2015   CLINICAL DATA:  Chest pain, shortness of breath.  EXAM: CT ANGIOGRAPHY CHEST WITH CONTRAST  TECHNIQUE: Multidetector CT imaging of the chest was performed using the standard protocol during bolus administration of intravenous contrast. Multiplanar CT image  reconstructions and MIPs were obtained to evaluate the vascular anatomy.  CONTRAST:  168m OMNIPAQUE IOHEXOL 350 MG/ML SOLN  COMPARISON:  CT scan of May 16, 2015.  FINDINGS: No pneumothorax or pleural effusion is noted. No acute pulmonary disease is noted. No definite evidence of pulmonary embolus is noted. Atherosclerosis of thoracic aorta is noted without aneurysm formation. Visualized portion of upper abdomen is unremarkable. No mediastinal mass or adenopathy is noted. No significant osseous abnormality is noted.  Review of the MIP images confirms the above findings.  IMPRESSION: No definite evidence of pulmonary embolus. No acute cardiopulmonary abnormality seen.   Electronically Signed   By: JMarijo Conception M.D.   On: 05/17/2015 17:54   Nm Myocar Multi W/spect W/wall Motion / Ef  05/19/2015    There was no ST segment deviation noted during stress.  Defect 1: There is a medium defect of moderate severity.  Findings consistent with prior myocardial infarction.  This is an intermediate risk study.  Nuclear stress EF: 42%.   Intermediate risk study. There moderate sized and intensity fixed, mid to  apical inferolateral wall defect consistent with scar. No significant  reversible ischemia is noted. LVEF 42% with inferolateral hypokinesis.   Dg Chest Port 1 View  06/04/2015   CLINICAL DATA:  Center chest pain, SOB x1 day. Elevated troponin levels. Hx of HTN, CHF, DM, ESRD on dialysis, CAD.Ex-smoker, quit x2 years ago.  EXAM: PORTABLE CHEST - 1 VIEW  COMPARISON:  05/16/2015  FINDINGS: Cardiac silhouette is mildly enlarged. No mediastinal or hilar masses or evidence of adenopathy.  Clear lungs.  No pleural effusion or pneumothorax.  Skeletal structures are unremarkable.  IMPRESSION: No acute cardiopulmonary disease.   Electronically Signed   By: DLajean ManesM.D.   On: 06/04/2015 12:50   Dg Chest Portable 1 View  05/16/2015   CLINICAL DATA:  59year old hypertensive diabetic male on dialysis presenting  with 3 hours chest pain. Subsequent encounter.  EXAM: PORTABLE CHEST - 1 VIEW  COMPARISON:  03/31/2015  FINDINGS: Chronically elevated left hemidiaphragm.  Cardiomegaly with pulmonary vascular congestion.  No segmental consolidation or gross pneumothorax.  IMPRESSION: Cardiomegaly with pulmonary vascular congestion.   Electronically Signed   By: SGenia DelM.D.   On: 05/16/2015 09:05    Microbiology: No results found for this or any previous visit (from the past 240 hour(s)).   Labs: Basic Metabolic Panel:  Recent Labs Lab 06/04/15 1152 06/05/15 0506  NA 137 136  K 3.4* 4.6  CL 95* 95*  CO2 30 30  GLUCOSE 86 123*  BUN 16 29*  CREATININE 5.54* 7.63*  CALCIUM 9.9 9.3   Liver Function Tests:  Recent Labs Lab 06/04/15 1152  AST 15  ALT 10*  ALKPHOS 67  BILITOT 0.9  PROT 8.2*  ALBUMIN 4.0   No results for input(s): LIPASE, AMYLASE in the last 168 hours. No results for input(s): AMMONIA in the last 168 hours. CBC:  Recent Labs Lab 06/04/15 1152 06/05/15 0506  WBC 6.0 5.2  HGB 12.2* 10.9*  HCT 38.2* 34.0*  MCV 80.6 81.0  PLT 178 142*   Cardiac Enzymes:  Recent Labs Lab 06/04/15 1152 06/04/15 1817 06/04/15 2245 06/05/15 0506  TROPONINI 0.26* 0.15* 0.19* 0.17*   BNP: BNP (last 3 results)  Recent Labs  11/20/14 2245 12/06/14 0845 12/12/14 2100  BNP 75.6 86.9 816.3*    ProBNP (last 3 results) No results for input(s): PROBNP in the last 8760 hours.  CBG:  Recent Labs Lab 06/04/15 1700 06/04/15 2103 06/05/15 0739 06/05/15 1120  GLUCAP 70 113* 114* 109*    Time coordinating discharge: 35 minutes  Signed:  Marin Milley  [facility] 06/05/2015, 5:07 PM

## 2015-06-05 NOTE — Progress Notes (Signed)
SATURATION QUALIFICATIONS: (This note is used to comply with regulatory documentation for home oxygen)  Patient Saturations on Room Air at Rest = 98%  Patient Saturations on Room Air while Ambulating = 82%  Patient Saturations on 2 Liters of oxygen while Ambulating = 96%  Please briefly explain why patient needs home oxygen: Patient felt short of breath when walking (on Room Air).  States he feels like this at home often and becomes dizzy.  He has frequent falls due to this also.  After O2 applied, he felt better and was able to complete the walk (about 150 feet)

## 2015-06-06 LAB — HEMOGLOBIN A1C
HEMOGLOBIN A1C: 6.1 % — AB (ref 4.8–5.6)
MEAN PLASMA GLUCOSE: 128 mg/dL

## 2015-06-13 ENCOUNTER — Inpatient Hospital Stay (HOSPITAL_COMMUNITY)
Admission: EM | Admit: 2015-06-13 | Discharge: 2015-06-14 | DRG: 682 | Disposition: A | Discharge status: Home / Self Care | Payer: Medicare Other | Attending: Internal Medicine | Admitting: Internal Medicine

## 2015-06-13 ENCOUNTER — Encounter (HOSPITAL_COMMUNITY): Payer: Self-pay | Admitting: Emergency Medicine

## 2015-06-13 ENCOUNTER — Emergency Department (HOSPITAL_COMMUNITY): Payer: Medicare Other

## 2015-06-13 DIAGNOSIS — Z86010 Personal history of colonic polyps: Secondary | ICD-10-CM

## 2015-06-13 DIAGNOSIS — J810 Acute pulmonary edema: Secondary | ICD-10-CM

## 2015-06-13 DIAGNOSIS — I252 Old myocardial infarction: Secondary | ICD-10-CM

## 2015-06-13 DIAGNOSIS — Z992 Dependence on renal dialysis: Secondary | ICD-10-CM

## 2015-06-13 DIAGNOSIS — R079 Chest pain, unspecified: Secondary | ICD-10-CM | POA: Diagnosis not present

## 2015-06-13 DIAGNOSIS — Z7902 Long term (current) use of antithrombotics/antiplatelets: Secondary | ICD-10-CM

## 2015-06-13 DIAGNOSIS — Z79899 Other long term (current) drug therapy: Secondary | ICD-10-CM

## 2015-06-13 DIAGNOSIS — N186 End stage renal disease: Secondary | ICD-10-CM | POA: Diagnosis not present

## 2015-06-13 DIAGNOSIS — F329 Major depressive disorder, single episode, unspecified: Secondary | ICD-10-CM | POA: Diagnosis present

## 2015-06-13 DIAGNOSIS — E1121 Type 2 diabetes mellitus with diabetic nephropathy: Secondary | ICD-10-CM | POA: Diagnosis present

## 2015-06-13 DIAGNOSIS — I2510 Atherosclerotic heart disease of native coronary artery without angina pectoris: Secondary | ICD-10-CM

## 2015-06-13 DIAGNOSIS — R0789 Other chest pain: Secondary | ICD-10-CM | POA: Diagnosis not present

## 2015-06-13 DIAGNOSIS — Z9861 Coronary angioplasty status: Secondary | ICD-10-CM | POA: Diagnosis not present

## 2015-06-13 DIAGNOSIS — Z888 Allergy status to other drugs, medicaments and biological substances status: Secondary | ICD-10-CM | POA: Diagnosis not present

## 2015-06-13 DIAGNOSIS — Z9114 Patient's other noncompliance with medication regimen: Secondary | ICD-10-CM | POA: Diagnosis present

## 2015-06-13 DIAGNOSIS — J811 Chronic pulmonary edema: Secondary | ICD-10-CM | POA: Diagnosis present

## 2015-06-13 DIAGNOSIS — Z955 Presence of coronary angioplasty implant and graft: Secondary | ICD-10-CM

## 2015-06-13 DIAGNOSIS — D631 Anemia in chronic kidney disease: Secondary | ICD-10-CM | POA: Diagnosis present

## 2015-06-13 DIAGNOSIS — Z87891 Personal history of nicotine dependence: Secondary | ICD-10-CM | POA: Diagnosis not present

## 2015-06-13 DIAGNOSIS — R072 Precordial pain: Secondary | ICD-10-CM | POA: Diagnosis not present

## 2015-06-13 DIAGNOSIS — I120 Hypertensive chronic kidney disease with stage 5 chronic kidney disease or end stage renal disease: Principal | ICD-10-CM | POA: Diagnosis present

## 2015-06-13 DIAGNOSIS — R0602 Shortness of breath: Secondary | ICD-10-CM | POA: Diagnosis present

## 2015-06-13 DIAGNOSIS — Z7982 Long term (current) use of aspirin: Secondary | ICD-10-CM

## 2015-06-13 DIAGNOSIS — E1122 Type 2 diabetes mellitus with diabetic chronic kidney disease: Secondary | ICD-10-CM | POA: Diagnosis present

## 2015-06-13 DIAGNOSIS — I5042 Chronic combined systolic (congestive) and diastolic (congestive) heart failure: Secondary | ICD-10-CM | POA: Diagnosis present

## 2015-06-13 LAB — CBC WITH DIFFERENTIAL/PLATELET
BASOS ABS: 0 10*3/uL (ref 0.0–0.1)
BASOS PCT: 0 % (ref 0–1)
BASOS PCT: 0 % (ref 0–1)
Basophils Absolute: 0 10*3/uL (ref 0.0–0.1)
EOS ABS: 0.2 10*3/uL (ref 0.0–0.7)
EOS ABS: 0.2 10*3/uL (ref 0.0–0.7)
EOS PCT: 4 % (ref 0–5)
Eosinophils Relative: 3 % (ref 0–5)
HCT: 37.1 % — ABNORMAL LOW (ref 39.0–52.0)
HEMATOCRIT: 36.7 % — AB (ref 39.0–52.0)
HEMOGLOBIN: 11.5 g/dL — AB (ref 13.0–17.0)
Hemoglobin: 11.7 g/dL — ABNORMAL LOW (ref 13.0–17.0)
LYMPHS ABS: 0.9 10*3/uL (ref 0.7–4.0)
Lymphocytes Relative: 17 % (ref 12–46)
Lymphocytes Relative: 14 % (ref 12–46)
Lymphs Abs: 1.2 10*3/uL (ref 0.7–4.0)
MCH: 25.5 pg — ABNORMAL LOW (ref 26.0–34.0)
MCH: 25.5 pg — AB (ref 26.0–34.0)
MCHC: 31.3 g/dL (ref 30.0–36.0)
MCHC: 31.5 g/dL (ref 30.0–36.0)
MCV: 81.4 fL (ref 78.0–100.0)
MCV: 80.8 fL (ref 78.0–100.0)
Monocytes Absolute: 0.5 10*3/uL (ref 0.1–1.0)
Monocytes Absolute: 0.5 10*3/uL (ref 0.1–1.0)
Monocytes Relative: 7 % (ref 3–12)
Monocytes Relative: 8 % (ref 3–12)
NEUTROS ABS: 5.3 10*3/uL (ref 1.7–7.7)
NEUTROS PCT: 73 % (ref 43–77)
Neutro Abs: 4.6 10*3/uL (ref 1.7–7.7)
Neutrophils Relative %: 74 % (ref 43–77)
PLATELETS: 174 10*3/uL (ref 150–400)
Platelets: 193 10*3/uL (ref 150–400)
RBC: 4.51 MIL/uL (ref 4.22–5.81)
RBC: 4.59 MIL/uL (ref 4.22–5.81)
RDW: 17.8 % — ABNORMAL HIGH (ref 11.5–15.5)
RDW: 17.7 % — ABNORMAL HIGH (ref 11.5–15.5)
WBC: 7.3 10*3/uL (ref 4.0–10.5)
WBC: 6.2 10*3/uL (ref 4.0–10.5)

## 2015-06-13 LAB — COMPREHENSIVE METABOLIC PANEL
ALBUMIN: 3.7 g/dL (ref 3.5–5.0)
ALK PHOS: 74 U/L (ref 38–126)
ALK PHOS: 69 U/L (ref 38–126)
ALT: 12 U/L — AB (ref 17–63)
ALT: 13 U/L — AB (ref 17–63)
ANION GAP: 15 (ref 5–15)
AST: 14 U/L — AB (ref 15–41)
AST: 17 U/L (ref 15–41)
Albumin: 3.8 g/dL (ref 3.5–5.0)
Anion gap: 12 (ref 5–15)
BILIRUBIN TOTAL: 0.4 mg/dL (ref 0.3–1.2)
BILIRUBIN TOTAL: 0.9 mg/dL (ref 0.3–1.2)
BUN: 36 mg/dL — AB (ref 6–20)
BUN: 16 mg/dL (ref 6–20)
CALCIUM: 9.7 mg/dL (ref 8.9–10.3)
CALCIUM: 9.4 mg/dL (ref 8.9–10.3)
CHLORIDE: 99 mmol/L — AB (ref 101–111)
CO2: 26 mmol/L (ref 22–32)
CO2: 27 mmol/L (ref 22–32)
CREATININE: 9.42 mg/dL — AB (ref 0.61–1.24)
CREATININE: 5.93 mg/dL — AB (ref 0.61–1.24)
Chloride: 95 mmol/L — ABNORMAL LOW (ref 101–111)
GFR calc Af Amer: 6 mL/min — ABNORMAL LOW (ref >60)
GFR calc non Af Amer: 5 mL/min — ABNORMAL LOW (ref >60)
GFR, EST AFRICAN AMERICAN: 11 mL/min — AB (ref >60)
GFR, EST NON AFRICAN AMERICAN: 9 mL/min — AB (ref >60)
GLUCOSE: 100 mg/dL — AB (ref 65–99)
Glucose, Bld: 125 mg/dL — ABNORMAL HIGH (ref 65–99)
Potassium: 4 mmol/L (ref 3.5–5.1)
Potassium: 3.8 mmol/L (ref 3.5–5.1)
SODIUM: 136 mmol/L (ref 135–145)
Sodium: 138 mmol/L (ref 135–145)
TOTAL PROTEIN: 7.6 g/dL (ref 6.5–8.1)
Total Protein: 7.4 g/dL (ref 6.5–8.1)

## 2015-06-13 LAB — TROPONIN I
Troponin I: <0.03 ng/mL (ref <0.031)
Troponin I: <0.03 ng/mL (ref <0.031)
Troponin I: <0.03 ng/mL (ref <0.031)

## 2015-06-13 LAB — GLUCOSE, CAPILLARY
GLUCOSE-CAPILLARY: 101 mg/dL — AB (ref 65–99)
GLUCOSE-CAPILLARY: 106 mg/dL — AB (ref 65–99)

## 2015-06-13 LAB — I-STAT TROPONIN, ED: Troponin i, poc: 0.02 ng/mL (ref 0.00–0.08)

## 2015-06-13 LAB — BRAIN NATRIURETIC PEPTIDE: B Natriuretic Peptide: 672.6 pg/mL — ABNORMAL HIGH (ref 0.0–100.0)

## 2015-06-13 LAB — MRSA PCR SCREENING: MRSA by PCR: NEGATIVE

## 2015-06-13 MED ORDER — ALBUTEROL (5 MG/ML) CONTINUOUS INHALATION SOLN
INHALATION_SOLUTION | RESPIRATORY_TRACT | Status: AC
  Filled 2015-06-13: qty 20

## 2015-06-13 MED ORDER — NITROGLYCERIN 0.4 MG SL SUBL: 0.4000 mg | SUBLINGUAL_TABLET | SUBLINGUAL | Status: DC | PRN

## 2015-06-13 MED ORDER — PREGABALIN 25 MG PO CAPS
50.0000 mg | ORAL_CAPSULE | Freq: Every day | ORAL | Status: DC
  Administered 2015-06-13: 50 mg via ORAL
  Filled 2015-06-13: qty 2

## 2015-06-13 MED ORDER — ONDANSETRON HCL 4 MG/2ML IJ SOLN
4.0000 mg | Freq: Four times a day (QID) | INTRAMUSCULAR | Status: DC | PRN
  Filled 2015-06-13: qty 2

## 2015-06-13 MED ORDER — RENA-VITE PO TABS
1.0000 | ORAL_TABLET | Freq: Every day | ORAL | Status: DC
  Administered 2015-06-13: 1 via ORAL
  Filled 2015-06-13: qty 1

## 2015-06-13 MED ORDER — LIDOCAINE HCL (PF) 1 % IJ SOLN: 5.0000 mL | INTRAMUSCULAR | Status: DC | PRN

## 2015-06-13 MED ORDER — SODIUM CHLORIDE 0.9 % IV SOLN: 100.0000 mL | INTRAVENOUS | Status: DC | PRN

## 2015-06-13 MED ORDER — ASPIRIN 325 MG PO TABS
325.0000 mg | ORAL_TABLET | Freq: Every day | ORAL | Status: DC
  Administered 2015-06-13 – 2015-06-14 (×2): 325 mg via ORAL
  Filled 2015-06-13 (×2): qty 1

## 2015-06-13 MED ORDER — HEPARIN SODIUM (PORCINE) 1000 UNIT/ML DIALYSIS: 1000.0000 [IU] | INTRAMUSCULAR | Status: DC | PRN

## 2015-06-13 MED ORDER — FINASTERIDE 5 MG PO TABS
5.0000 mg | ORAL_TABLET | Freq: Every day | ORAL | Status: DC
  Administered 2015-06-13 – 2015-06-14 (×2): 5 mg via ORAL
  Filled 2015-06-13 (×3): qty 1

## 2015-06-13 MED ORDER — PROMETHAZINE HCL 25 MG/ML IJ SOLN
12.5000 mg | Freq: Once | INTRAMUSCULAR | Status: AC
  Administered 2015-06-13: 12.5 mg via INTRAVENOUS
  Filled 2015-06-13: qty 1

## 2015-06-13 MED ORDER — MECLIZINE HCL 25 MG PO TABS
25.0000 mg | ORAL_TABLET | Freq: Three times a day (TID) | ORAL | Status: DC | PRN
  Administered 2015-06-13: 25 mg via ORAL
  Filled 2015-06-13 (×2): qty 1

## 2015-06-13 MED ORDER — HYDRALAZINE HCL 20 MG/ML IJ SOLN
10.0000 mg | Freq: Once | INTRAMUSCULAR | Status: AC
  Administered 2015-06-13: 10 mg via INTRAVENOUS

## 2015-06-13 MED ORDER — ENOXAPARIN SODIUM 30 MG/0.3ML ~~LOC~~ SOLN
30.0000 mg | SUBCUTANEOUS | Status: DC
  Administered 2015-06-13 – 2015-06-14 (×2): 30 mg via SUBCUTANEOUS
  Filled 2015-06-13 (×2): qty 0.3

## 2015-06-13 MED ORDER — SEVELAMER CARBONATE 800 MG PO TABS
2400.0000 mg | ORAL_TABLET | Freq: Three times a day (TID) | ORAL | Status: DC
  Administered 2015-06-13 – 2015-06-14 (×3): 2400 mg via ORAL
  Filled 2015-06-13 (×4): qty 3

## 2015-06-13 MED ORDER — VENLAFAXINE HCL 37.5 MG PO TABS
37.5000 mg | ORAL_TABLET | Freq: Two times a day (BID) | ORAL | Status: DC
  Administered 2015-06-13 – 2015-06-14 (×2): 37.5 mg via ORAL
  Filled 2015-06-13 (×4): qty 1

## 2015-06-13 MED ORDER — HYDRALAZINE HCL 20 MG/ML IJ SOLN
INTRAMUSCULAR | Status: AC
  Filled 2015-06-13: qty 1

## 2015-06-13 MED ORDER — SODIUM CHLORIDE 0.9 % IV SOLN: 62.5000 mg | INTRAVENOUS | Status: DC

## 2015-06-13 MED ORDER — MORPHINE SULFATE (PF) 2 MG/ML IV SOLN
1.0000 mg | INTRAVENOUS | Status: DC | PRN
  Administered 2015-06-13: 1 mg via INTRAVENOUS
  Filled 2015-06-13: qty 1

## 2015-06-13 MED ORDER — MENTHOL 3 MG MT LOZG
1.0000 | LOZENGE | OROMUCOSAL | Status: DC | PRN
  Filled 2015-06-13: qty 9

## 2015-06-13 MED ORDER — PREGABALIN 25 MG PO CAPS: 50.0000 mg | ORAL_CAPSULE | Freq: Every day | ORAL | Status: DC

## 2015-06-13 MED ORDER — LIDOCAINE-PRILOCAINE 2.5-2.5 % EX CREA: 1.0000 "application " | TOPICAL_CREAM | CUTANEOUS | Status: DC | PRN

## 2015-06-13 MED ORDER — DOXERCALCIFEROL 4 MCG/2ML IV SOLN
2.0000 ug | INTRAVENOUS | Status: DC
  Administered 2015-06-14: 2 ug via INTRAVENOUS
  Filled 2015-06-13 (×2): qty 2

## 2015-06-13 MED ORDER — CINACALCET HCL 30 MG PO TABS
90.0000 mg | ORAL_TABLET | Freq: Every day | ORAL | Status: DC
  Administered 2015-06-13 – 2015-06-14 (×2): 90 mg via ORAL
  Filled 2015-06-13 (×2): qty 3

## 2015-06-13 MED ORDER — ATORVASTATIN CALCIUM 20 MG PO TABS
20.0000 mg | ORAL_TABLET | Freq: Every day | ORAL | Status: DC
  Administered 2015-06-13: 20 mg via ORAL
  Filled 2015-06-13: qty 1

## 2015-06-13 MED ORDER — ISOSORBIDE MONONITRATE ER 30 MG PO TB24
15.0000 mg | ORAL_TABLET | Freq: Every day | ORAL | Status: DC
  Administered 2015-06-13 – 2015-06-14 (×2): 15 mg via ORAL
  Filled 2015-06-13 (×3): qty 1

## 2015-06-13 MED ORDER — ACETAMINOPHEN 325 MG PO TABS
ORAL_TABLET | ORAL | Status: AC
  Filled 2015-06-13: qty 2

## 2015-06-13 MED ORDER — CLOPIDOGREL BISULFATE 75 MG PO TABS
75.0000 mg | ORAL_TABLET | Freq: Every day | ORAL | Status: DC
  Administered 2015-06-13 – 2015-06-14 (×2): 75 mg via ORAL
  Filled 2015-06-13 (×2): qty 1

## 2015-06-13 MED ORDER — ALTEPLASE 2 MG IJ SOLR: 2.0000 mg | Freq: Once | INTRAMUSCULAR | Status: DC | PRN

## 2015-06-13 MED ORDER — CARVEDILOL 25 MG PO TABS
50.0000 mg | ORAL_TABLET | Freq: Two times a day (BID) | ORAL | Status: DC
  Administered 2015-06-13 – 2015-06-14 (×2): 50 mg via ORAL
  Filled 2015-06-13 (×2): qty 2

## 2015-06-13 MED ORDER — HEPARIN SODIUM (PORCINE) 1000 UNIT/ML DIALYSIS: 4000.0000 [IU] | INTRAMUSCULAR | Status: DC | PRN

## 2015-06-13 MED ORDER — RANOLAZINE ER 500 MG PO TB12
500.0000 mg | ORAL_TABLET | Freq: Two times a day (BID) | ORAL | Status: DC
Start: 2015-06-13 — End: 2015-06-14
  Administered 2015-06-13: 500 mg via ORAL
  Filled 2015-06-13: qty 1

## 2015-06-13 MED ORDER — SODIUM CHLORIDE 0.9 % IJ SOLN
3.0000 mL | Freq: Two times a day (BID) | INTRAMUSCULAR | Status: DC
  Administered 2015-06-13 – 2015-06-14 (×3): 3 mL via INTRAVENOUS

## 2015-06-13 MED ORDER — IPRATROPIUM-ALBUTEROL 20-100 MCG/ACT IN AERS: 2.0000 | INHALATION_SPRAY | Freq: Four times a day (QID) | RESPIRATORY_TRACT | Status: DC | PRN

## 2015-06-13 MED ORDER — ONDANSETRON HCL 4 MG PO TABS: 4.0000 mg | ORAL_TABLET | Freq: Four times a day (QID) | ORAL | Status: DC | PRN

## 2015-06-13 MED ORDER — PENTAFLUOROPROP-TETRAFLUOROETH EX AERO: 1.0000 "application " | INHALATION_SPRAY | CUTANEOUS | Status: DC | PRN

## 2015-06-13 MED ORDER — INSULIN ASPART 100 UNIT/ML ~~LOC~~ SOLN: 0.0000 [IU] | Freq: Three times a day (TID) | SUBCUTANEOUS | Status: DC

## 2015-06-13 MED ORDER — NITROGLYCERIN 0.4 MG SL SUBL
0.4000 mg | SUBLINGUAL_TABLET | SUBLINGUAL | Status: DC | PRN
  Administered 2015-06-13: 0.4 mg via SUBLINGUAL
  Filled 2015-06-13: qty 1

## 2015-06-13 MED ORDER — LIDOCAINE 5 % EX PTCH
1.0000 | MEDICATED_PATCH | CUTANEOUS | Status: DC
  Administered 2015-06-14: 1 via TRANSDERMAL
  Filled 2015-06-13 (×2): qty 1

## 2015-06-13 MED ORDER — AMITRIPTYLINE HCL 100 MG PO TABS
100.0000 mg | ORAL_TABLET | Freq: Every day | ORAL | Status: DC
  Administered 2015-06-13: 100 mg via ORAL
  Filled 2015-06-13 (×2): qty 1

## 2015-06-13 MED ORDER — ACETAMINOPHEN 325 MG PO TABS
650.0000 mg | ORAL_TABLET | Freq: Four times a day (QID) | ORAL | Status: DC | PRN
  Administered 2015-06-13: 650 mg via ORAL

## 2015-06-13 MED ORDER — PANTOPRAZOLE SODIUM 40 MG PO TBEC
40.0000 mg | DELAYED_RELEASE_TABLET | Freq: Every day | ORAL | Status: DC
  Administered 2015-06-13 – 2015-06-14 (×2): 40 mg via ORAL
  Filled 2015-06-13 (×2): qty 1

## 2015-06-13 MED ORDER — ACETAMINOPHEN 650 MG RE SUPP: 650.0000 mg | Freq: Four times a day (QID) | RECTAL | Status: DC | PRN

## 2015-06-13 MED ORDER — AMLODIPINE BESYLATE 10 MG PO TABS
10.0000 mg | ORAL_TABLET | Freq: Every day | ORAL | Status: DC
  Administered 2015-06-13 – 2015-06-14 (×2): 10 mg via ORAL
  Filled 2015-06-13 (×2): qty 1

## 2015-06-13 NOTE — ED Notes (Signed)
Heart healthy/Carb modified breakfast tray ordered

## 2015-06-13 NOTE — ED Provider Notes (Signed)
CSN: [id number]   Arrival date & time 06/13/15 0239  History  This chart was scribed for Julianne Rice, MD by Altamease Oiler, ED Scribe. This patient was seen in room A01C/A01C and the patient's care was started at 2:57 AM.  Chief Complaint  Patient presents with  . Chest Pain  . Shortness of Breath    HPI The history is provided by the patient. No language interpreter was used.   [name] [name] is a 59 y.o. male with PMHx of DM, HTN, CAD, CHF, and ESRD, who presents to the Emergency Department complaining of SOB with onset last night (06/12/15). The SOB is worse with laying flat.  Associated symptoms include sharp central chest pain with onset last night and nausea.  Pt denies abdominal pain, vomiting, cough, and LE swelling. He was last dialyzed on 06/11/15 and the normal amount was taken off.   Past Medical History  Diagnosis Date  . Hypertension   . ESRD on dialysis   . Diabetes mellitus with nephropathy   . Hematochezia     a. 2014: colonscopy, which showed moderately-sized internal hemorrhoids, two 57mm polyps in transverse colon and ascending colon that were resected, five 2-11mm polyps in sigmoid colon, descending colon, transverse colon, and ascending colon that were resected. An upper endoscopy was performed and showed normal esophagus, stomach, and duodenum.  . Hematuria     a. H/o hematuria 2014 with cystoscopy that was unrevealing for his source of hematuria. He underwent a kidney ultrasound on 10/14 that showed mildly echogenic and scarred kidneys compatible with medical renal disease, without hydronephrosis or renal calculi.  [name] Anemia   . Depression   . CAD (coronary artery disease)     a. per [facility] s/p BMS to mid LAD 12/2009 and DES to mid LAD 10/2010.  . Colon polyps   . Chronic diastolic CHF (congestive heart failure)     Past Surgical History  Procedure Laterality Date  . Left heart catheterization with coronary angiogram N/A 11/23/2014    Procedure: LEFT HEART  CATHETERIZATION WITH CORONARY ANGIOGRAM;  Surgeon: Troy Sine, MD;  Location: [facility] CATH LAB;  Service: Cardiovascular;  Laterality: N/A;    Family History  Problem Relation Age of Onset  . Hypertension    . Bone cancer Mother   . Anuerysm Father   . Diabetes type II Daughter     Social History  Substance Use Topics  . Smoking status: Former Smoker    Quit date: 12/06/2010  . Smokeless tobacco: Never Used  . Alcohol Use: No     Review of Systems  Constitutional: Negative for fever and chills.  Respiratory: Positive for shortness of breath. Negative for cough and wheezing.   Cardiovascular: Positive for chest pain. Negative for palpitations and leg swelling.  Gastrointestinal: Positive for nausea. Negative for vomiting, abdominal pain and diarrhea.  Musculoskeletal: Negative for back pain, neck pain and neck stiffness.  Skin: Negative for rash and wound.  Neurological: Negative for dizziness, weakness, light-headedness, numbness and headaches.  All other systems reviewed and are negative.   Home Medications   Prior to Admission medications   Medication Sig Start Date End Date Taking? Authorizing Provider  acetaminophen (TYLENOL) 500 MG tablet Take 500 mg by mouth every 6 (six) hours as needed for headache.    Yes Historical Provider, MD  amitriptyline (ELAVIL) 100 MG tablet Take 1 tablet (100 mg total) by mouth at bedtime. 12/15/14  Yes Jessica U Vann, DO  amLODipine (NORVASC) 10 MG tablet Take  10 mg by mouth daily.   Yes Historical Provider, MD  aspirin 325 MG tablet Take 1 tablet (325 mg total) by mouth daily. 04/03/15  Yes Orson Eva, MD  atorvastatin (LIPITOR) 20 MG tablet Take 20 mg by mouth at bedtime.    Yes Historical Provider, MD  carvedilol (COREG) 25 MG tablet Take 50 mg by mouth 2 (two) times daily.    Yes Historical Provider, MD  cinacalcet (SENSIPAR) 90 MG tablet Take 90 mg by mouth at bedtime.    Yes Historical Provider, MD  clopidogrel (PLAVIX) 75 MG tablet Take 1  tablet (75 mg total) by mouth daily. 11/24/14  Yes Juluis Mire, MD  Darbepoetin Alfa (ARANESP) 25 MCG/0.42ML SOSY injection Inject 0.42 mLs (25 mcg total) into the vein every Tuesday with hemodialysis. 12/15/14  Yes Geradine Girt, DO  doxercalciferol (HECTOROL) 4 MCG/2ML injection Inject 1 mL (2 mcg total) into the vein Every Tuesday,Thursday,and Saturday with dialysis. 12/15/14  Yes Geradine Girt, DO  ferric gluconate 62.5 mg in sodium chloride 0.9 % 100 mL Inject 62.5 mg into the vein every Thursday with hemodialysis. 12/16/14  Yes Geradine Girt, DO  finasteride (PROSCAR) 5 MG tablet Take 5 mg by mouth daily.   Yes Historical Provider, MD  Ipratropium-Albuterol (COMBIVENT RESPIMAT) 20-100 MCG/ACT AERS respimat Inhale 2 puffs into the lungs every 6 (six) hours as needed for wheezing.   Yes Historical Provider, MD  isosorbide mononitrate (IMDUR) 30 MG 24 hr tablet Take 0.5 tablets (15 mg total) by mouth daily. 06/05/15  Yes Janece Canterbury, MD  lidocaine (LIDODERM) 5 % Place 1 patch onto the skin daily. Remove & Discard patch within 12 hours or as directed by MD 06/05/15  Yes Janece Canterbury, MD  meclizine (ANTIVERT) 25 MG tablet Take 25 mg by mouth 3 (three) times daily as needed for dizziness.   Yes Historical Provider, MD  multivitamin (RENA-VIT) TABS tablet Take 1 tablet by mouth at bedtime. 12/15/14  Yes Geradine Girt, DO  nitroGLYCERIN (NITROSTAT) 0.4 MG SL tablet Place 1 tablet (0.4 mg total) under the tongue every 5 (five) minutes as needed for chest pain. 06/05/15  Yes Janece Canterbury, MD  pantoprazole (PROTONIX) 40 MG tablet Take 1 tablet (40 mg total) by mouth daily. 06/05/15  Yes Janece Canterbury, MD  pregabalin (LYRICA) 50 MG capsule Take 1 capsule (50 mg total) by mouth daily. 12/15/14  Yes Geradine Girt, DO  ranolazine (RANEXA) 500 MG 12 hr tablet Take 1 tablet (500 mg total) by mouth 2 (two) times daily. 11/24/14  Yes Juluis Mire, MD  sevelamer carbonate (RENVELA) 800 MG tablet Take 2,400 mg  by mouth 3 (three) times daily with meals.    Yes Historical Provider, MD  venlafaxine (EFFEXOR) 37.5 MG tablet Take 37.5 mg by mouth 2 (two) times daily.   Yes Historical Provider, MD    Allergies  Enalapril  Triage Vitals: BP 180/94 mmHg  Pulse 110  Temp(Src) 98.4 F (36.9 C) (Oral)  Resp 28  SpO2 100%  Physical Exam  Constitutional: He is oriented to person, place, and time. He appears well-developed and well-nourished. No distress.  HENT:  Head: Normocephalic and atraumatic.  Mouth/Throat: Oropharynx is clear and moist.  Eyes: EOM are normal. Pupils are equal, round, and reactive to light.  Neck: Normal range of motion. Neck supple.  Cardiovascular: Normal rate and regular rhythm.   Pulmonary/Chest: No respiratory distress. He has no wheezes. He has rales. He exhibits no tenderness.  Increased respiratory  effort. Rales bilateral bases.  Abdominal: Soft. Bowel sounds are normal. He exhibits no distension and no mass. There is no tenderness. There is no rebound and no guarding.  Musculoskeletal: Normal range of motion. He exhibits no edema or tenderness.  Left upper extremity fistula with palpable thrill. Distal pulses intact. No pedal edema or calf swelling.  Neurological: He is alert and oriented to person, place, and time.  Moves all extremities without deficit. Sensation grossly intact.  Skin: Skin is warm and dry. No rash noted. No erythema.  Psychiatric: He has a normal mood and affect. His behavior is normal.  Nursing note and vitals reviewed.   ED Course  Procedures   DIAGNOSTIC STUDIES: Oxygen Saturation is 100% on 2 L, adequate by my interpretation.      Labs Reviewed  CBC WITH DIFFERENTIAL/PLATELET - Abnormal; Notable for the following:    Hemoglobin 11.5 (*)    HCT 36.7 (*)    MCH 25.5 (*)    RDW 17.8 (*)    All other components within normal limits  COMPREHENSIVE METABOLIC PANEL - Abnormal; Notable for the following:    Chloride 95 (*)    Glucose,  Bld 100 (*)    BUN 36 (*)    Creatinine, Ser 9.42 (*)    AST 14 (*)    ALT 12 (*)    GFR calc non Af Amer 5 (*)    GFR calc Af Amer 6 (*)    All other components within normal limits  BRAIN NATRIURETIC PEPTIDE - Abnormal; Notable for the following:    B Natriuretic Peptide 672.6 (*)    All other components within normal limits  COMPREHENSIVE METABOLIC PANEL - Abnormal; Notable for the following:    Chloride 99 (*)    Glucose, Bld 125 (*)    Creatinine, Ser 5.93 (*)    ALT 13 (*)    GFR calc non Af Amer 9 (*)    GFR calc Af Amer 11 (*)    All other components within normal limits  CBC WITH DIFFERENTIAL/PLATELET - Abnormal; Notable for the following:    Hemoglobin 11.7 (*)    HCT 37.1 (*)    MCH 25.5 (*)    RDW 17.7 (*)    All other components within normal limits  GLUCOSE, CAPILLARY - Abnormal; Notable for the following:    Glucose-Capillary 101 (*)    All other components within normal limits  GLUCOSE, CAPILLARY - Abnormal; Notable for the following:    Glucose-Capillary 106 (*)    All other components within normal limits  MRSA PCR SCREENING  TROPONIN I  TROPONIN I  TROPONIN I  CBC  I-STAT TROPOININ, ED    I, Florencia Reasons, MD, personally reviewed and evaluated these images and lab results as part of my medical decision-making.  Imaging Review Dg Chest Port 1 View  06/13/2015   CLINICAL DATA:  Shortness of breath and cough tonight.  EXAM: PORTABLE CHEST - 1 VIEW  COMPARISON:  06/04/2015  FINDINGS: Shallow inspiration with elevation of the left hemidiaphragm. Cardiac enlargement with mild pulmonary vascular congestion. Infiltration in the lung bases likely due to edema. No blunting of costophrenic angles. No pneumothorax.  IMPRESSION: Cardiac enlargement with mild pulmonary vascular congestion and basilar edema.   Electronically Signed   By: Lucienne Capers M.D.   On: 06/13/2015 03:37    EKG Interpretation  Date/Time:  Monday June 13 2015 02:47:02 EDT Ventricular Rate:   111 PR Interval:  137 QRS Duration: 105  QT Interval:  380 QTC Calculation: 516 R Axis:   47 Text Interpretation:  Sinus tachycardia Probable left atrial enlargement Left ventricular hypertrophy ST elevation, consider anterior injury Prolonged QT interval Confirmed by Lita Mains  MD, Crystina Borrayo ([id number]) on 06/13/2015 3:10:43 AM       MDM   Final diagnoses:  Acute pulmonary edema  Chest pain, unspecified chest pain type     I personally performed the services described in this documentation, which was scribed in my presence. The recorded information has been reviewed and is accurate.  Patient is breathing has improved on  BiPAP. EKG without any ischemic findings. Patient been seen multiple times for atypical chest pain. Given aspirin in route by EMS. Discussed with [facility] and will see patient and admit to step down bed. Also discussed with nephrology sig the patient can get dialysis this morning.  Julianne Rice, MD 06/14/15 [phone]

## 2015-06-13 NOTE — H&P (Signed)
[facility] History and Physical  [name] [name] [id number] DOB: 01-08-1956 DOA: 06/13/2015  Referring physician: Dr. Lita Mains. PCP: [name]  Specialists: Nephrologist.  Chief Complaint: Chest pain and shortness of breath.  HPI: [name] [name] is a 59 y.o. male with history of CAD status post stenting, hypertension, diabetes mellitus not on any medication at this time, he ESRD on hemodialysis presents to the ER because of chest pain and shortness of breath. Patient's symptoms started last night with sudden onset of chest pain which was retrosternal pressure-like nonradiating with shortness of breath. Denies any associated fever chills or productive cough. EKG was showing sinus tachycardia with cardiac markers being negative. Patient was recently admitted to the hospital last week for hypertensive urgency. Patient also had stress tests on 05/19/2059 which was showing intermediate risk stress test. Patient also had a cardiac cath in February 2016. Patient still has mild chest discomfort.   Review of Systems: As presented in the history of presenting illness, rest negative.  Past Medical History  Diagnosis Date  . Hypertension   . ESRD on dialysis   . Diabetes mellitus with nephropathy   . Hematochezia     a. 2014: colonscopy, which showed moderately-sized internal hemorrhoids, two 19mm polyps in transverse colon and ascending colon that were resected, five 2-37mm polyps in sigmoid colon, descending colon, transverse colon, and ascending colon that were resected. An upper endoscopy was performed and showed normal esophagus, stomach, and duodenum.  . Hematuria     a. H/o hematuria 2014 with cystoscopy that was unrevealing for his source of hematuria. He underwent a kidney ultrasound on 10/14 that showed mildly echogenic and scarred kidneys compatible with medical renal disease, without hydronephrosis or renal calculi.  [name] Anemia   . Depression   . CAD (coronary artery disease)      a. per [facility] s/p BMS to mid LAD 12/2009 and DES to mid LAD 10/2010.  . Colon polyps   . Chronic diastolic CHF (congestive heart failure)    Past Surgical History  Procedure Laterality Date  . Left heart catheterization with coronary angiogram N/A 11/23/2014    Procedure: LEFT HEART CATHETERIZATION WITH CORONARY ANGIOGRAM;  Surgeon: Troy Sine, MD;  Location: [facility] CATH LAB;  Service: Cardiovascular;  Laterality: N/A;   Social History:  reports that he quit smoking about 4 years ago. He has never used smokeless tobacco. He reports that he does not drink alcohol or use illicit drugs. Where does patient live home. Can patient participate in ADLs? Yes.  Allergies  Allergen Reactions  . Enalapril Hives    Family History:  Family History  Problem Relation Age of Onset  . Hypertension    . Bone cancer Mother   . Anuerysm Father   . Diabetes type II Daughter       Prior to Admission medications   Medication Sig Start Date End Date Taking? Authorizing Provider  acetaminophen (TYLENOL) 500 MG tablet Take 500 mg by mouth every 6 (six) hours as needed for headache.    Yes Historical Provider, MD  amitriptyline (ELAVIL) 100 MG tablet Take 1 tablet (100 mg total) by mouth at bedtime. 12/15/14  Yes Jessica U Vann, DO  amLODipine (NORVASC) 10 MG tablet Take 10 mg by mouth daily.   Yes Historical Provider, MD  aspirin 325 MG tablet Take 1 tablet (325 mg total) by mouth daily. 04/03/15  Yes Orson Eva, MD  atorvastatin (LIPITOR) 20 MG tablet Take 20 mg by mouth at bedtime.  Yes Historical Provider, MD  carvedilol (COREG) 25 MG tablet Take 50 mg by mouth 2 (two) times daily.    Yes Historical Provider, MD  cinacalcet (SENSIPAR) 90 MG tablet Take 90 mg by mouth at bedtime.    Yes Historical Provider, MD  clopidogrel (PLAVIX) 75 MG tablet Take 1 tablet (75 mg total) by mouth daily. 11/24/14  Yes Juluis Mire, MD  Darbepoetin Alfa (ARANESP) 25 MCG/0.42ML SOSY injection Inject 0.42 mLs (25 mcg  total) into the vein every Tuesday with hemodialysis. 12/15/14  Yes Geradine Girt, DO  doxercalciferol (HECTOROL) 4 MCG/2ML injection Inject 1 mL (2 mcg total) into the vein Every Tuesday,Thursday,and Saturday with dialysis. 12/15/14  Yes Geradine Girt, DO  ferric gluconate 62.5 mg in sodium chloride 0.9 % 100 mL Inject 62.5 mg into the vein every Thursday with hemodialysis. 12/16/14  Yes Geradine Girt, DO  finasteride (PROSCAR) 5 MG tablet Take 5 mg by mouth daily.   Yes Historical Provider, MD  Ipratropium-Albuterol (COMBIVENT RESPIMAT) 20-100 MCG/ACT AERS respimat Inhale 2 puffs into the lungs every 6 (six) hours as needed for wheezing.   Yes Historical Provider, MD  isosorbide mononitrate (IMDUR) 30 MG 24 hr tablet Take 0.5 tablets (15 mg total) by mouth daily. 06/05/15  Yes Janece Canterbury, MD  lidocaine (LIDODERM) 5 % Place 1 patch onto the skin daily. Remove & Discard patch within 12 hours or as directed by MD 06/05/15  Yes Janece Canterbury, MD  meclizine (ANTIVERT) 25 MG tablet Take 25 mg by mouth 3 (three) times daily as needed for dizziness.   Yes Historical Provider, MD  multivitamin (RENA-VIT) TABS tablet Take 1 tablet by mouth at bedtime. 12/15/14  Yes Geradine Girt, DO  nitroGLYCERIN (NITROSTAT) 0.4 MG SL tablet Place 1 tablet (0.4 mg total) under the tongue every 5 (five) minutes as needed for chest pain. 06/05/15  Yes Janece Canterbury, MD  pantoprazole (PROTONIX) 40 MG tablet Take 1 tablet (40 mg total) by mouth daily. 06/05/15  Yes Janece Canterbury, MD  pregabalin (LYRICA) 50 MG capsule Take 1 capsule (50 mg total) by mouth daily. 12/15/14  Yes Geradine Girt, DO  ranolazine (RANEXA) 500 MG 12 hr tablet Take 1 tablet (500 mg total) by mouth 2 (two) times daily. 11/24/14  Yes Juluis Mire, MD  sevelamer carbonate (RENVELA) 800 MG tablet Take 2,400 mg by mouth 3 (three) times daily with meals.    Yes Historical Provider, MD  venlafaxine (EFFEXOR) 37.5 MG tablet Take 37.5 mg by mouth 2 (two) times  daily.   Yes Historical Provider, MD    Physical Exam: Filed Vitals:   06/13/15 0505 06/13/15 0515 06/13/15 0530 06/13/15 0545  BP: 171/99 172/98 185/109 178/103  Pulse: 108 107 111 106  Temp:      TempSrc:      Resp: 16 18 23 19   SpO2: 95% 95% 95%      General:  Moderately built and nourished.  Eyes: Anicteric no pallor.  ENT: No discharge from the ears eyes nose and mouth.  Neck: Mildly elevated JVD no mass felt.  Cardiovascular: S1 and S2 heard.  Respiratory: No rhonchi or crepitations.  Abdomen: Soft nontender bowel sounds present.  Skin: No rash.  Musculoskeletal: No edema.  Psychiatric: Appears normal.  Neurologic: Alert awake oriented to time place and person. Moves all extremities.  Labs on Admission:  Basic Metabolic Panel:  Recent Labs Lab 06/13/15 0304  NA 136  K 4.0  CL 95*  CO2 26  GLUCOSE 100*  BUN 36*  CREATININE 9.42*  CALCIUM 9.7   Liver Function Tests:  Recent Labs Lab 06/13/15 0304  AST 14*  ALT 12*  ALKPHOS 74  BILITOT 0.4  PROT 7.4  ALBUMIN 3.7   No results for input(s): LIPASE, AMYLASE in the last 168 hours. No results for input(s): AMMONIA in the last 168 hours. CBC:  Recent Labs Lab 06/13/15 0304  WBC 7.3  NEUTROABS 5.3  HGB 11.5*  HCT 36.7*  MCV 81.4  PLT 193   Cardiac Enzymes: No results for input(s): CKTOTAL, CKMB, CKMBINDEX, TROPONINI in the last 168 hours.  BNP (last 3 results)  Recent Labs  12/06/14 0845 12/12/14 2100 06/13/15 0304  BNP 86.9 816.3* 672.6*    ProBNP (last 3 results) No results for input(s): PROBNP in the last 8760 hours.  CBG: No results for input(s): GLUCAP in the last 168 hours.  Radiological Exams on Admission: Dg Chest Port 1 View  06/13/2015   CLINICAL DATA:  Shortness of breath and cough tonight.  EXAM: PORTABLE CHEST - 1 VIEW  COMPARISON:  06/04/2015  FINDINGS: Shallow inspiration with elevation of the left hemidiaphragm. Cardiac enlargement with mild pulmonary  vascular congestion. Infiltration in the lung bases likely due to edema. No blunting of costophrenic angles. No pneumothorax.  IMPRESSION: Cardiac enlargement with mild pulmonary vascular congestion and basilar edema.   Electronically Signed   By: Lucienne Capers M.D.   On: 06/13/2015 03:37    EKG: Independently reviewed. Sinus tachycardia.  Assessment/Plan Principal Problem:   Pulmonary edema Active Problems:   Chest pain   ESRD (end stage renal disease)   CAD -S/P LAD BMS 2011, LAD DES 2012- patent cors Feb 2016   Hyperphosphatemia   SOB (shortness of breath)   1. Pulmonary edema probably from fluid overload in a patient with known history of ESRD - patient states he has not missed his dialysis. Patient gets dialyzed on Tuesday Thursday and Saturday. Please consult nephrology for dialysis. 2. Chest pain with history of CAD status post stenting - I have consulted cardiology. Cycle cardiac markers. Patient is on Imdur which was recently added. Continue antiplatelets agents and statins. 3. Chronic systolic and diastolic heart failure last evening showed was 45-50% with grade 2 diastolic dysfunction last month - fluid management per nephrology. 4. Chronic anemia from renal disease - follow CBC. 5. Hypertension - continue home medications.  I have reviewed patient's old charts of labs and personally reviewed patient's chest x-ray and EKG. I have discussed with on-call cardiology.   DVT Prophylaxis Lovenox.  Code Status: Full code.  Family Communication: Discussed with patient.  Disposition Plan: Admit for observation.    Marlean Mortell N. [facility] Pager [phone].  If 7PM-7AM, please contact night-coverage [email or web address] Password [email or web address] 06/13/2015, 6:08 AM

## 2015-06-13 NOTE — Progress Notes (Signed)
Patient seen and examined, feeling better, need to continue titrate blood pressure meds, cardiology/nephroloyg input appreciated.

## 2015-06-13 NOTE — Congregational Nurse Program (Deleted)
Renal Service Consult Note [facility]  [name] [name] 06/13/2015 [place] Requesting Physician:  Dr Erlinda Hong  Reason for Consult:  ESRD patient with SOB, resp distress HPI: The patient is a 59 y.o. year-old with hx of DM, HTN, CAD/cardiac stents presented to ED with SOB this am.  Onset last evening, no associated fevers, chills.  +chest pain. No n/v/d or abd complaints. Gets to dry wt, hasn't missed HD which is TTS at [facility] On HD x 8 years. Was on bipap and is now on [place] O2, breathing better.    Last admit was here in August with CP, had Myoview which showed EF 42% and fixed defect c/w scar.   ROS  no skin rash  no joitn pain  no HA or seizures  Past Medical History  Past Medical History  Diagnosis Date  . Hypertension   . ESRD on dialysis   . Diabetes mellitus with nephropathy   . Hematochezia     a. 2014: colonscopy, which showed moderately-sized internal hemorrhoids, two 37mm polyps in transverse colon and ascending colon that were resected, five 2-29mm polyps in sigmoid colon, descending colon, transverse colon, and ascending colon that were resected. An upper endoscopy was performed and showed normal esophagus, stomach, and duodenum.  . Hematuria     a. H/o hematuria 2014 with cystoscopy that was unrevealing for his source of hematuria. He underwent a kidney ultrasound on 10/14 that showed mildly echogenic and scarred kidneys compatible with medical renal disease, without hydronephrosis or renal calculi.  [name] Anemia   . Depression   . CAD (coronary artery disease)     a. per [facility] s/p BMS to mid LAD 12/2009 and DES to mid LAD 10/2010.  . Colon polyps   . Chronic diastolic CHF (congestive heart failure)    Past Surgical History  Past Surgical History  Procedure Laterality Date  . Left heart catheterization with coronary angiogram N/A 11/23/2014    Procedure: LEFT HEART CATHETERIZATION WITH CORONARY ANGIOGRAM;  Surgeon: Troy Sine, MD;  Location:  [facility] CATH LAB;  Service: Cardiovascular;  Laterality: N/A;   Family History  Family History  Problem Relation Age of Onset  . Hypertension    . Bone cancer Mother   . Anuerysm Father   . Diabetes type II Daughter    Social History  reports that he quit smoking about 4 years ago. He has never used smokeless tobacco. He reports that he does not drink alcohol or use illicit drugs. Allergies  Allergies  Allergen Reactions  . Enalapril Hives   Home medications Prior to Admission medications   Medication Sig Start Date End Date Taking? Authorizing Provider  acetaminophen (TYLENOL) 500 MG tablet Take 500 mg by mouth every 6 (six) hours as needed for headache.    Yes Historical Provider, MD  amitriptyline (ELAVIL) 100 MG tablet Take 1 tablet (100 mg total) by mouth at bedtime. 12/15/14  Yes Jessica U Vann, DO  amLODipine (NORVASC) 10 MG tablet Take 10 mg by mouth daily.   Yes Historical Provider, MD  aspirin 325 MG tablet Take 1 tablet (325 mg total) by mouth daily. 04/03/15  Yes Orson Eva, MD  atorvastatin (LIPITOR) 20 MG tablet Take 20 mg by mouth at bedtime.    Yes Historical Provider, MD  carvedilol (COREG) 25 MG tablet Take 50 mg by mouth 2 (two) times daily.    Yes Historical Provider, MD  cinacalcet (SENSIPAR) 90 MG tablet Take 90 mg by mouth at bedtime.  Yes Historical Provider, MD  clopidogrel (PLAVIX) 75 MG tablet Take 1 tablet (75 mg total) by mouth daily. 11/24/14  Yes Juluis Mire, MD  Darbepoetin Alfa (ARANESP) 25 MCG/0.42ML SOSY injection Inject 0.42 mLs (25 mcg total) into the vein every Tuesday with hemodialysis. 12/15/14  Yes Geradine Girt, DO  doxercalciferol (HECTOROL) 4 MCG/2ML injection Inject 1 mL (2 mcg total) into the vein Every Tuesday,Thursday,and Saturday with dialysis. 12/15/14  Yes Geradine Girt, DO  ferric gluconate 62.5 mg in sodium chloride 0.9 % 100 mL Inject 62.5 mg into the vein every Thursday with hemodialysis. 12/16/14  Yes Geradine Girt, DO  finasteride  (PROSCAR) 5 MG tablet Take 5 mg by mouth daily.   Yes Historical Provider, MD  Ipratropium-Albuterol (COMBIVENT RESPIMAT) 20-100 MCG/ACT AERS respimat Inhale 2 puffs into the lungs every 6 (six) hours as needed for wheezing.   Yes Historical Provider, MD  isosorbide mononitrate (IMDUR) 30 MG 24 hr tablet Take 0.5 tablets (15 mg total) by mouth daily. 06/05/15  Yes Janece Canterbury, MD  lidocaine (LIDODERM) 5 % Place 1 patch onto the skin daily. Remove & Discard patch within 12 hours or as directed by MD 06/05/15  Yes Janece Canterbury, MD  meclizine (ANTIVERT) 25 MG tablet Take 25 mg by mouth 3 (three) times daily as needed for dizziness.   Yes Historical Provider, MD  multivitamin (RENA-VIT) TABS tablet Take 1 tablet by mouth at bedtime. 12/15/14  Yes Geradine Girt, DO  nitroGLYCERIN (NITROSTAT) 0.4 MG SL tablet Place 1 tablet (0.4 mg total) under the tongue every 5 (five) minutes as needed for chest pain. 06/05/15  Yes Janece Canterbury, MD  pantoprazole (PROTONIX) 40 MG tablet Take 1 tablet (40 mg total) by mouth daily. 06/05/15  Yes Janece Canterbury, MD  pregabalin (LYRICA) 50 MG capsule Take 1 capsule (50 mg total) by mouth daily. 12/15/14  Yes Geradine Girt, DO  ranolazine (RANEXA) 500 MG 12 hr tablet Take 1 tablet (500 mg total) by mouth 2 (two) times daily. 11/24/14  Yes Juluis Mire, MD  sevelamer carbonate (RENVELA) 800 MG tablet Take 2,400 mg by mouth 3 (three) times daily with meals.    Yes Historical Provider, MD  venlafaxine (EFFEXOR) 37.5 MG tablet Take 37.5 mg by mouth 2 (two) times daily.   Yes Historical Provider, MD   Liver Function Tests  Recent Labs Lab 06/13/15 0304  AST 14*  ALT 12*  ALKPHOS 74  BILITOT 0.4  PROT 7.4  ALBUMIN 3.7   No results for input(s): LIPASE, AMYLASE in the last 168 hours. CBC  Recent Labs Lab 06/13/15 0304  WBC 7.3  NEUTROABS 5.3  HGB 11.5*  HCT 36.7*  MCV 81.4  PLT 0000000   Basic Metabolic Panel  Recent Labs Lab 06/13/15 0304  NA 136  K 4.0   CL 95*  CO2 26  GLUCOSE 100*  BUN 36*  CREATININE 9.42*  CALCIUM 9.7    Filed Vitals:   06/13/15 0725 06/13/15 0728 06/13/15 0745 06/13/15 0800  BP:   164/90 148/89  Pulse: 102 105 103 100  Temp:      TempSrc:      Resp:      SpO2: 95% 94% 96% 93%   Exam Alert, no distress No rash, cyanosis or gangrene Sclera anicteric, throat clear No JVd Chest faint bibasilar rales RRR transmitted bruit from L arm AVF, no gallop ABd soft ntnd no mass or ascites +bs GU deferred Ext no LE or UE edema,  no joint effusion or deformity Neuro is nf, Ox 3 LUA AVF is patent   TTS AF  4h  89kg (was 88kg in Aug)  2/2.25 bath  Heparin 8000  LUA AVF   Assessment: 1. Dyspnea/ acute resp distress - pulm edema by cxr, better after bipap 2. ESRD HD TTS 3. DM 2 4. HTN on norvasc, coreg 5. CAD hx stents on imdur, asa, plavix   Plan- HD upstairs asap , get volume down  Kelly Splinter MD (pgr) [phone]    [phone] 06/13/2015, 8:55 AM

## 2015-06-13 NOTE — ED Notes (Signed)
Per EMS pt began to experience central shooting chest pain around 9:30pm at which time he also began to experience SOB.  He was placed on a bipap at which time his O2 went from 90% to 100%.  EMS reports hearing rails in his lower lobes.  BP went from 220 to 180/100.  Pt does have a hx of this pain in the past.  CBG was 109 and he was given 324 of ASP in the field.

## 2015-06-13 NOTE — Consult Note (Signed)
Reason for Consult: Chest Pain Referring Physician:   [name] is an 59 y.o. male.  HPI:  This is a 59 y.o. male with a past medical history significant for CAD (s/p BMS to mid LAD 12/2009 and DES to mid LAD 10/2010 per [facility]), HTN, DM, depression, anemia, h/o hematochezia/hematuria in 2449, chronic diastolic CHF, and ESRD on HD. He had a left heart cath on 11/23/14 revealing no significant coronary obstructive disease with widely patent LAD stent in the proximal to midsegment with minimal 20% smooth in-stent narrowing and mild 20% ostial narrowing of the first diagonal branch within the stented segment. Normal LV function. echo showed abn EF of 55% with mild MR and grade 1 DD.  We are asked to see for Chest Pain He had a Lexisdcan cardiolite on 05/19/15 which revealed a medium defect of moderate severity consistent with prior MI. Considered intermediate risk. No reversible ischemia. He was just discharged on 06/05/15 after being treated for HTN urgency.  We saw him then for CP as well.  Troponin was mildly elevated(0.17).  He was started on Imdur 15 mg which he said is not covered by Medicaid.   He developed SOB last night at 2130hrs and then had chest pain that like someone "stabbing me with a pen or knife".  He was laying down trying to sleep when this all happened.  At the worst it was 6/10 a,d currently 4/10.  He was at a [employer] in [place] yesterday and did not have any CP during the day when walking around.   He's had chest pain frequently since his heart catheterization back in February does not occur every day. He also has intermittent nausea and vomiting which is with or without chest pain.  The patient currently denies fever,orthopnea, dizziness, PND, cough, congestion, abdominal pain, hematochezia, melena, lower extremity edema, claudication.  Past Medical History  Diagnosis Date  . Hypertension   . ESRD on dialysis   . Diabetes mellitus  with nephropathy   . Hematochezia     a. 2014: colonscopy, which showed moderately-sized internal hemorrhoids, two 5m polyps in transverse colon and ascending colon that were resected, five 2-373mpolyps in sigmoid colon, descending colon, transverse colon, and ascending colon that were resected. An upper endoscopy was performed and showed normal esophagus, stomach, and duodenum.  . Hematuria     a. H/o hematuria 2014 with cystoscopy that was unrevealing for his source of hematuria. He underwent a kidney ultrasound on 10/14 that showed mildly echogenic and scarred kidneys compatible with medical renal disease, without hydronephrosis or renal calculi.  . Marland Kitchennemia   . Depression   . CAD (coronary artery disease)     a. per [facility] s/p BMS to mid LAD 12/2009 and DES to mid LAD 10/2010.  . Colon polyps   . Chronic diastolic CHF (congestive heart failure)     Past Surgical History  Procedure Laterality Date  . Left heart catheterization with coronary angiogram N/A 11/23/2014    Procedure: LEFT HEART CATHETERIZATION WITH CORONARY ANGIOGRAM;  Surgeon: ThTroy SineMD;  [facility];  Service: Cardiovascular;  Laterality: N/A;    Family History  Problem Relation Age of Onset  . Hypertension    . Bone cancer Mother   . Anuerysm Father   . Diabetes type II Daughter     Social History:  reports that he quit smoking about 4 years ago. He has never used smokeless tobacco. He reports that  he does not drink alcohol or use illicit drugs.  Allergies:  Allergies  Allergen Reactions  . Enalapril Hives    Medications:   Medication Sig  acetaminophen (TYLENOL) 500 MG tablet Take 500 mg by mouth every 6 (six) hours as needed for headache.   amitriptyline (ELAVIL) 100 MG tablet Take 1 tablet (100 mg total) by mouth at bedtime.  amLODipine (NORVASC) 10 MG tablet Take 10 mg by mouth daily.  aspirin 325 MG tablet Take 1 tablet (325 mg total) by mouth daily.  atorvastatin (LIPITOR) 20 MG  tablet Take 20 mg by mouth at bedtime.   carvedilol (COREG) 25 MG tablet Take 50 mg by mouth 2 (two) times daily.   cinacalcet (SENSIPAR) 90 MG tablet Take 90 mg by mouth at bedtime.   clopidogrel (PLAVIX) 75 MG tablet Take 1 tablet (75 mg total) by mouth daily.  Darbepoetin Alfa (ARANESP) 25 MCG/0.42ML SOSY injection Inject 0.42 mLs (25 mcg total) into the vein every Tuesday with hemodialysis.  doxercalciferol (HECTOROL) 4 MCG/2ML injection Inject 1 mL (2 mcg total) into the vein Every Tuesday,Thursday,and Saturday with dialysis.  ferric gluconate 62.5 mg in sodium chloride 0.9 % 100 mL Inject 62.5 mg into the vein every Thursday with hemodialysis.  finasteride (PROSCAR) 5 MG tablet Take 5 mg by mouth daily.  Ipratropium-Albuterol (COMBIVENT RESPIMAT) 20-100 MCG/ACT AERS respimat Inhale 2 puffs into the lungs every 6 (six) hours as needed for wheezing.  isosorbide mononitrate (IMDUR) 30 MG 24 hr tablet Take 0.5 tablets (15 mg total) by mouth daily.  lidocaine (LIDODERM) 5 % Place 1 patch onto the skin daily. Remove & Discard patch within 12 hours or as directed by MD  meclizine (ANTIVERT) 25 MG tablet Take 25 mg by mouth 3 (three) times daily as needed for dizziness.  multivitamin (RENA-VIT) TABS tablet Take 1 tablet by mouth at bedtime.  nitroGLYCERIN (NITROSTAT) 0.4 MG SL tablet Place 1 tablet (0.4 mg total) under the tongue every 5 (five) minutes as needed for chest pain.  pantoprazole (PROTONIX) 40 MG tablet Take 1 tablet (40 mg total) by mouth daily.  pregabalin (LYRICA) 50 MG capsule Take 1 capsule (50 mg total) by mouth daily.  ranolazine (RANEXA) 500 MG 12 hr tablet Take 1 tablet (500 mg total) by mouth 2 (two) times daily.  sevelamer carbonate (RENVELA) 800 MG tablet Take 2,400 mg by mouth 3 (three) times daily with meals.   venlafaxine (EFFEXOR) 37.5 MG tablet Take 37.5 mg by mouth 2 (two) times daily.     Results for orders placed or performed during the hospital encounter of  06/13/15 (from the past 48 hour(s))  CBC with Differential/Platelet     Status: Abnormal   Collection Time: 06/13/15  3:04 AM  Result Value Ref Range   WBC 7.3 4.0 - 10.5 K/uL   RBC 4.51 4.22 - 5.81 MIL/uL   Hemoglobin 11.5 (L) 13.0 - 17.0 g/dL   HCT 36.7 (L) 39.0 - 52.0 %   MCV 81.4 78.0 - 100.0 fL   MCH 25.5 (L) 26.0 - 34.0 pg   MCHC 31.3 30.0 - 36.0 g/dL   RDW 17.8 (H) 11.5 - 15.5 %   Platelets 193 150 - 400 K/uL   Neutrophils Relative % 73 43 - 77 %   Neutro Abs 5.3 1.7 - 7.7 K/uL   Lymphocytes Relative 17 12 - 46 %   Lymphs Abs 1.2 0.7 - 4.0 K/uL   Monocytes Relative 7 3 - 12 %   Monocytes Absolute  0.5 0.1 - 1.0 K/uL   Eosinophils Relative 3 0 - 5 %   Eosinophils Absolute 0.2 0.0 - 0.7 K/uL   Basophils Relative 0 0 - 1 %   Basophils Absolute 0.0 0.0 - 0.1 K/uL  Comprehensive metabolic panel     Status: Abnormal   Collection Time: 06/13/15  3:04 AM  Result Value Ref Range   Sodium 136 135 - 145 mmol/L   Potassium 4.0 3.5 - 5.1 mmol/L   Chloride 95 (L) 101 - 111 mmol/L   CO2 26 22 - 32 mmol/L   Glucose, Bld 100 (H) 65 - 99 mg/dL   BUN 36 (H) 6 - 20 mg/dL   Creatinine, Ser 9.42 (H) 0.61 - 1.24 mg/dL   Calcium 9.7 8.9 - 10.3 mg/dL   Total Protein 7.4 6.5 - 8.1 g/dL   Albumin 3.7 3.5 - 5.0 g/dL   AST 14 (L) 15 - 41 U/L   ALT 12 (L) 17 - 63 U/L   Alkaline Phosphatase 74 38 - 126 U/L   Total Bilirubin 0.4 0.3 - 1.2 mg/dL   GFR calc non Af Amer 5 (L) >60 mL/min   GFR calc Af Amer 6 (L) >60 mL/min    Comment: (NOTE) The eGFR has been calculated using the CKD EPI equation. This calculation has not been validated in all clinical situations. eGFR's persistently <60 mL/min signify possible Chronic Kidney Disease.    Anion gap 15 5 - 15  Brain natriuretic peptide     Status: Abnormal   Collection Time: 06/13/15  3:04 AM  Result Value Ref Range   B Natriuretic Peptide 672.6 (H) 0.0 - 100.0 pg/mL  I-stat troponin, ED     Status: None   Collection Time: 06/13/15  3:12 AM    Result Value Ref Range   Troponin i, poc 0.02 0.00 - 0.08 ng/mL   Comment 3            Comment: Due to the release kinetics of cTnI, a negative result within the first hours of the onset of symptoms does not rule out myocardial infarction with certainty. If myocardial infarction is still suspected, repeat the test at appropriate intervals.    Lipid Panel     Component Value Date/Time   CHOL 86 04/01/2015 0630   TRIG 79 04/01/2015 0630   HDL 22* 04/01/2015 0630   CHOLHDL 3.9 04/01/2015 0630   VLDL 16 04/01/2015 0630   LDLCALC 48 04/01/2015 0630     Dg Chest Port 1 View  06/13/2015   CLINICAL DATA:  Shortness of breath and cough tonight.  EXAM: PORTABLE CHEST - 1 VIEW  COMPARISON:  06/04/2015  FINDINGS: Shallow inspiration with elevation of the left hemidiaphragm. Cardiac enlargement with mild pulmonary vascular congestion. Infiltration in the lung bases likely due to edema. No blunting of costophrenic angles. No pneumothorax.  IMPRESSION: Cardiac enlargement with mild pulmonary vascular congestion and basilar edema.   Electronically Signed   By: Lucienne Capers M.D.   On: 06/13/2015 03:37    Review of Systems  Constitutional: Negative for fever and diaphoresis.  HENT: Negative for congestion and sore throat.   Respiratory: Positive for shortness of breath. Negative for cough.   Cardiovascular: Negative for chest pain, orthopnea, leg swelling and PND.  Gastrointestinal: Positive for nausea (Once and awhile). Negative for vomiting, abdominal pain, blood in stool and melena.  Genitourinary: Negative for hematuria.  Neurological: Negative for dizziness.  All other systems reviewed and are negative.  Blood pressure  178/103, pulse 106, temperature 98.4 F (36.9 C), temperature source Oral, resp. rate 19, SpO2 95 %. Physical Exam  Nursing note and vitals reviewed. Constitutional: He is oriented to person, place, and time. He appears well-developed and well-nourished. No distress.   Laying flat in the bed  HENT:  Head: Normocephalic and atraumatic.  Mouth/Throat: No oropharyngeal exudate.  Eyes: EOM are normal. Pupils are equal, round, and reactive to light. No scleral icterus.  Neck: Normal range of motion. Neck supple. No JVD present.  Cardiovascular: Normal rate, regular rhythm, S1 normal and S2 normal.   Murmur heard.  Systolic murmur is present with a grade of 2/6  Pulses:      Radial pulses are 2+ on the right side.       Dorsalis pedis pulses are 1+ on the right side, and 1+ on the left side.  Murmur loudest at the apex.   Respiratory: Effort normal and breath sounds normal. No respiratory distress. He has no wheezes.  GI: Soft. Bowel sounds are normal. He exhibits no distension. There is no tenderness.  Musculoskeletal: He exhibits no edema.  Lymphadenopathy:    He has no cervical adenopathy.  Neurological: He is alert and oriented to person, place, and time. He exhibits normal muscle tone.  Skin: Skin is warm and dry.  Psychiatric: He has a normal mood and affect.    Assessment/Plan: Principal Problem:   Pulmonary edema Active Problems:   Chest pain   ESRD (end stage renal disease)   CAD -S/P LAD BMS 2011, LAD DES 2012- patent cors Feb 2016   Hyperphosphatemia   SOB (shortness of breath)   Hypertension    Atypical chest pain.  No acute EKG changes and Troponin negative.  Lexiscan on 05/19/15 was nonischemic with moderate sized and intensity fixed, mid to apical inferolateral wall defect consistent with scar.   Heart cath 11/2014 with no significant coronary obstructive disease with widely patent LAD stent in the proximal to midsegment with minimal 20% smooth in-stent narrowing and mild 20% ostial narrowing of the first diagonal branch within the stented segment.  I do not think switching from Imdur to the dinitrate is going to change his CP.   LDL in June was 48.  Continue ASA, plavix, Coreg, statin.  Aurea Aronov, PA-C 06/13/2015, 6:59 AM

## 2015-06-13 NOTE — Progress Notes (Signed)
Removed Bipap off of patient per his request and MD approval. Patient is on 2L nasal cannula with a SpO2 of 98% and no apparent signs of respiratory distress. RT will continue to monitor.

## 2015-06-14 DIAGNOSIS — R072 Precordial pain: Secondary | ICD-10-CM

## 2015-06-14 DIAGNOSIS — I2510 Atherosclerotic heart disease of native coronary artery without angina pectoris: Secondary | ICD-10-CM

## 2015-06-14 DIAGNOSIS — Z9861 Coronary angioplasty status: Secondary | ICD-10-CM

## 2015-06-14 DIAGNOSIS — N186 End stage renal disease: Secondary | ICD-10-CM

## 2015-06-14 LAB — RENAL FUNCTION PANEL
ANION GAP: 10 (ref 5–15)
Albumin: 3.4 g/dL — ABNORMAL LOW (ref 3.5–5.0)
BUN: 32 mg/dL — AB (ref 6–20)
CHLORIDE: 98 mmol/L — AB (ref 101–111)
CO2: 29 mmol/L (ref 22–32)
Calcium: 9.1 mg/dL (ref 8.9–10.3)
Creatinine, Ser: 8.15 mg/dL — ABNORMAL HIGH (ref 0.61–1.24)
GFR calc Af Amer: 7 mL/min — ABNORMAL LOW (ref >60)
GFR, EST NON AFRICAN AMERICAN: 6 mL/min — AB (ref >60)
Glucose, Bld: 111 mg/dL — ABNORMAL HIGH (ref 65–99)
PHOSPHORUS: 6 mg/dL — AB (ref 2.5–4.6)
POTASSIUM: 4.7 mmol/L (ref 3.5–5.1)
Sodium: 137 mmol/L (ref 135–145)

## 2015-06-14 LAB — CBC
HEMATOCRIT: 34 % — AB (ref 39.0–52.0)
HEMOGLOBIN: 10.4 g/dL — AB (ref 13.0–17.0)
MCH: 24.8 pg — ABNORMAL LOW (ref 26.0–34.0)
MCHC: 30.6 g/dL (ref 30.0–36.0)
MCV: 81 fL (ref 78.0–100.0)
Platelets: 157 10*3/uL (ref 150–400)
RBC: 4.2 MIL/uL — ABNORMAL LOW (ref 4.22–5.81)
RDW: 17.6 % — AB (ref 11.5–15.5)
WBC: 4.4 10*3/uL (ref 4.0–10.5)

## 2015-06-14 LAB — GLUCOSE, CAPILLARY
GLUCOSE-CAPILLARY: 103 mg/dL — AB (ref 65–99)
Glucose-Capillary: 102 mg/dL — ABNORMAL HIGH (ref 65–99)
Glucose-Capillary: 129 mg/dL — ABNORMAL HIGH (ref 65–99)

## 2015-06-14 MED ORDER — DOXERCALCIFEROL 4 MCG/2ML IV SOLN
INTRAVENOUS | Status: AC
  Administered 2015-06-14: 2 ug via INTRAVENOUS
  Filled 2015-06-14: qty 2

## 2015-06-14 NOTE — Progress Notes (Signed)
  [facility] Progress Note   Subjective: feeling better, no SOB or cough, 3800 cc UF yesterday w HD  Filed Vitals:   06/14/15 0724 06/14/15 0900 06/14/15 1000 06/14/15 1152  BP: 138/81 149/88  159/95  Pulse:  90  92  Temp: 97.9 F (36.6 C)   97.9 F (36.6 C)  TempSrc: Oral   Oral  Resp: 18 16  17   Height:      Weight:      SpO2:  87% 97% 99%   Exam: Alert, no distress No JVd Chest clear bilat RRR transmitted bruit from L arm AVF, no gallop ABd soft ntnd no mass or ascites +bs GU deferred Ext no LE or UE edema Neuro is nf, Ox 3 LUA AVF is patent   TTS AF 4h 89kg (was 88kg in Aug) 2/2.25 bath Heparin 8000 LUA AVF   Assessment: 1. Dyspnea/ acute resp distress / pulm edema/ vol overload - improved 2. ESRD HD TTS 3. DM 2 4. HTN on norvasc, coreg 5. CAD hx stents on imdur, asa, plavix   Plan - HD today, ok for dc after HD, have d/w primary. Lower dry wt    Kelly Splinter MD  pager [phone]    cell [phone]  06/14/2015, 12:16 PM     Recent Labs Lab 06/13/15 0304 06/13/15 1535  NA 136 138  K 4.0 3.8  CL 95* 99*  CO2 26 27  GLUCOSE 100* 125*  BUN 36* 16  CREATININE 9.42* 5.93*  CALCIUM 9.7 9.4    Recent Labs Lab 06/13/15 0304 06/13/15 1535  AST 14* 17  ALT 12* 13*  ALKPHOS 74 69  BILITOT 0.4 0.9  PROT 7.4 7.6  ALBUMIN 3.7 3.8    Recent Labs Lab 06/13/15 0304 06/13/15 1535  WBC 7.3 6.2  NEUTROABS 5.3 4.6  HGB 11.5* 11.7*  HCT 36.7* 37.1*  MCV 81.4 80.8  PLT 193 174   . amitriptyline  100 mg Oral QHS  . amLODipine  10 mg Oral Daily  . aspirin  325 mg Oral Daily  . atorvastatin  20 mg Oral QHS  . carvedilol  50 mg Oral BID  . cinacalcet  90 mg Oral Q supper  . clopidogrel  75 mg Oral Daily  . doxercalciferol  2 mcg Intravenous Q T,Th,Sa-HD  . enoxaparin (LOVENOX) injection  30 mg Subcutaneous Q24H  . [START ON 06/16/2015] ferric gluconate (FERRLECIT/NULECIT) IV  62.5 mg Intravenous Q Thu-HD  . finasteride  5 mg  Oral Daily  . insulin aspart  0-9 Units Subcutaneous TID WC  . isosorbide mononitrate  15 mg Oral Daily  . lidocaine  1 patch Transdermal Q24H  . multivitamin  1 tablet Oral QHS  . pantoprazole  40 mg Oral Daily  . pregabalin  50 mg Oral QHS  . sevelamer carbonate  2,400 mg Oral TID WC  . sodium chloride  3 mL Intravenous Q12H  . venlafaxine  37.5 mg Oral BID     sodium chloride, sodium chloride, acetaminophen **OR** acetaminophen, alteplase, heparin, heparin, lidocaine (PF), lidocaine-prilocaine, meclizine, menthol-cetylpyridinium, morphine injection, nitroGLYCERIN, nitroGLYCERIN, ondansetron **OR** ondansetron (ZOFRAN) IV, pentafluoroprop-tetrafluoroeth

## 2015-06-14 NOTE — Consult Note (Signed)
Physician Signed [facility]rology [facility] 06/13/2015 8:55 AM     Renal Service Consult Note [facility]  [name] [name] 06/13/2015 [place] Requesting Physician: Dr Erlinda Hong  Reason for Consult: ESRD patient with SOB, resp distress HPI: The patient is a 59 y.o. year-old with hx of DM, HTN, CAD/cardiac stents presented to ED with SOB this am. Onset last evening, no associated fevers, chills. +chest pain. No n/v/d or abd complaints. Gets to dry wt, hasn't missed HD which is TTS at [facility]. On HD x 8 years. Was on bipap and is now on [place] O2, breathing better.   Last admit was here in August with CP, had Myoview which showed EF 42% and fixed defect c/w scar.   ROS no skin rash no joitn pain no HA or seizures  Past Medical History  Past Medical History  Diagnosis Date  . Hypertension   . ESRD on dialysis   . Diabetes mellitus with nephropathy   . Hematochezia     a. 2014: colonscopy, which showed moderately-sized internal hemorrhoids, two 48mm polyps in transverse colon and ascending colon that were resected, five 2-25mm polyps in sigmoid colon, descending colon, transverse colon, and ascending colon that were resected. An upper endoscopy was performed and showed normal esophagus, stomach, and duodenum.  . Hematuria     a. H/o hematuria 2014 with cystoscopy that was unrevealing for his source of hematuria. He underwent a kidney ultrasound on 10/14 that showed mildly echogenic and scarred kidneys compatible with medical renal disease, without hydronephrosis or renal calculi.  [name] Anemia   . Depression   . CAD (coronary artery disease)     a. per [facility] s/p BMS to mid LAD 12/2009 and DES to mid LAD 10/2010.  . Colon polyps   . Chronic diastolic CHF (congestive heart failure)    Past Surgical History  Past Surgical History  Procedure Laterality Date  . Left heart catheterization with coronary  angiogram N/A 11/23/2014    Procedure: LEFT HEART CATHETERIZATION WITH CORONARY ANGIOGRAM; Surgeon: Troy Sine, MD; Location: [facility] CATH LAB; Service: Cardiovascular; Laterality: N/A;   Family History  Family History  Problem Relation Age of Onset  . Hypertension    . Bone cancer Mother   . Anuerysm Father   . Diabetes type II Daughter    Social History  reports that he quit smoking about 4 years ago. He has never used smokeless tobacco. He reports that he does not drink alcohol or use illicit drugs. Allergies  Allergies  Allergen Reactions  . Enalapril Hives   Home medications Prior to Admission medications   Medication Sig Start Date End Date Taking? Authorizing Provider  acetaminophen (TYLENOL) 500 MG tablet Take 500 mg by mouth every 6 (six) hours as needed for headache.    Yes Historical Provider, MD  amitriptyline (ELAVIL) 100 MG tablet Take 1 tablet (100 mg total) by mouth at bedtime. 12/15/14  Yes Jessica U Vann, DO  amLODipine (NORVASC) 10 MG tablet Take 10 mg by mouth daily.   Yes Historical Provider, MD  aspirin 325 MG tablet Take 1 tablet (325 mg total) by mouth daily. 04/03/15  Yes Orson Eva, MD  atorvastatin (LIPITOR) 20 MG tablet Take 20 mg by mouth at bedtime.    Yes Historical Provider, MD  carvedilol (COREG) 25 MG tablet Take 50 mg by mouth 2 (two) times daily.    Yes Historical Provider, MD  cinacalcet (SENSIPAR) 90 MG tablet Take 90 mg by mouth at bedtime.  Yes Historical Provider, MD  clopidogrel (PLAVIX) 75 MG tablet Take 1 tablet (75 mg total) by mouth daily. 11/24/14  Yes Juluis Mire, MD  Darbepoetin Alfa (ARANESP) 25 MCG/0.42ML SOSY injection Inject 0.42 mLs (25 mcg total) into the vein every Tuesday with hemodialysis. 12/15/14  Yes Geradine Girt, DO  doxercalciferol (HECTOROL) 4 MCG/2ML injection Inject 1 mL (2 mcg total) into the vein Every Tuesday,Thursday,and  Saturday with dialysis. 12/15/14  Yes Geradine Girt, DO  ferric gluconate 62.5 mg in sodium chloride 0.9 % 100 mL Inject 62.5 mg into the vein every Thursday with hemodialysis. 12/16/14  Yes Geradine Girt, DO  finasteride (PROSCAR) 5 MG tablet Take 5 mg by mouth daily.   Yes Historical Provider, MD  Ipratropium-Albuterol (COMBIVENT RESPIMAT) 20-100 MCG/ACT AERS respimat Inhale 2 puffs into the lungs every 6 (six) hours as needed for wheezing.   Yes Historical Provider, MD  isosorbide mononitrate (IMDUR) 30 MG 24 hr tablet Take 0.5 tablets (15 mg total) by mouth daily. 06/05/15  Yes Janece Canterbury, MD  lidocaine (LIDODERM) 5 % Place 1 patch onto the skin daily. Remove & Discard patch within 12 hours or as directed by MD 06/05/15  Yes Janece Canterbury, MD  meclizine (ANTIVERT) 25 MG tablet Take 25 mg by mouth 3 (three) times daily as needed for dizziness.   Yes Historical Provider, MD  multivitamin (RENA-VIT) TABS tablet Take 1 tablet by mouth at bedtime. 12/15/14  Yes Geradine Girt, DO  nitroGLYCERIN (NITROSTAT) 0.4 MG SL tablet Place 1 tablet (0.4 mg total) under the tongue every 5 (five) minutes as needed for chest pain. 06/05/15  Yes Janece Canterbury, MD  pantoprazole (PROTONIX) 40 MG tablet Take 1 tablet (40 mg total) by mouth daily. 06/05/15  Yes Janece Canterbury, MD  pregabalin (LYRICA) 50 MG capsule Take 1 capsule (50 mg total) by mouth daily. 12/15/14  Yes Geradine Girt, DO  ranolazine (RANEXA) 500 MG 12 hr tablet Take 1 tablet (500 mg total) by mouth 2 (two) times daily. 11/24/14  Yes Juluis Mire, MD  sevelamer carbonate (RENVELA) 800 MG tablet Take 2,400 mg by mouth 3 (three) times daily with meals.    Yes Historical Provider, MD  venlafaxine (EFFEXOR) 37.5 MG tablet Take 37.5 mg by mouth 2 (two) times daily.   Yes Historical Provider, MD   Liver Function Tests  Last Labs      Recent Labs Lab 06/13/15 0304   AST 14*  ALT 12*  ALKPHOS 74  BILITOT 0.4  PROT 7.4  ALBUMIN 3.7      Last Labs     No results for input(s): LIPASE, AMYLASE in the last 168 hours.   CBC  Last Labs      Recent Labs Lab 06/13/15 0304  WBC 7.3  NEUTROABS 5.3  HGB 11.5*  HCT 36.7*  MCV 81.4  PLT 193     Basic Metabolic Panel  Last Labs      Recent Labs Lab 06/13/15 0304  NA 136  K 4.0  CL 95*  CO2 26  GLUCOSE 100*  BUN 36*  CREATININE 9.42*  CALCIUM 9.7      Filed Vitals:   06/13/15 0725 06/13/15 0728 06/13/15 0745 06/13/15 0800  BP:   164/90 148/89  Pulse: 102 105 103 100  Temp:      TempSrc:      Resp:      SpO2: 95% 94% 96% 93%   Exam Alert, no distress No rash, cyanosis or  gangrene Sclera anicteric, throat clear No JVd Chest faint bibasilar rales RRR transmitted bruit from L arm AVF, no gallop ABd soft ntnd no mass or ascites +bs GU deferred Ext no LE or UE edema, no joint effusion or deformity Neuro is nf, Ox 3 LUA AVF is patent   TTS AF 4h 89kg (was 88kg in Aug) 2/2.25 bath Heparin 8000 LUA AVF   Assessment: 1. Dyspnea/ acute resp distress - pulm edema by cxr, better after bipap 2. ESRD HD TTS 3. DM 2 4. HTN on norvasc, coreg 5. CAD hx stents on imdur, asa, plavix   Plan- HD upstairs asap , get volume down  Kelly Splinter MD (pgr) [phone] ([phone] 06/13/2015, 8:55 AM

## 2015-06-14 NOTE — Discharge Summary (Signed)
Discharge Summary  [name] [name] [id number] DOB: 05-28-1956  PCP: [name]  Admit date: 06/13/2015 Discharge date: 06/14/2015  Time spent: <36mins  Recommendations for Outpatient Follow-up:  1. F/u with nephrology and continue dialysis [facility] 2. F/u with cardiology Dr. Johnsie Cancel in two weeks. 3. F/u with PMD within two weeks for hospital discharge follow up  Discharge Diagnoses:  Active Hospital Problems   Diagnosis Date Noted  . Pulmonary edema 06/13/2015  . SOB (shortness of breath) 06/13/2015  . Hyperphosphatemia 11/09/2014  . CAD -S/P LAD BMS 2011, LAD DES 2012- patent cors Feb 2016 04/27/2014  . Chest pain 04/26/2014  . ESRD (end stage renal disease) 04/26/2014    Resolved Hospital Problems   Diagnosis Date Noted Date Resolved  No resolved problems to display.    Discharge Condition: stable  Diet recommendation: renal diet  Filed Weights   06/13/15 0923 06/13/15 1450 06/14/15 0500  Weight: 204 lb 2.3 oz (92.6 kg) 196 lb 6.9 oz (89.1 kg) 195 lb 5.2 oz (88.6 kg)    History of present illness:  [name] [name] is a 59 y.o. male with history of CAD status post stenting, hypertension, diabetes mellitus not on any medication at this time, he ESRD on hemodialysis presents to the ER because of chest pain and shortness of breath. Patient's symptoms started last night with sudden onset of chest pain which was retrosternal pressure-like nonradiating with shortness of breath. Denies any associated fever chills or productive cough. EKG was showing sinus tachycardia with cardiac markers being negative. Patient was recently admitted to the hospital last week for hypertensive urgency. Patient also had stress tests on 05/19/2059 which was showing intermediate risk stress test. Patient also had a cardiac cath in February 2016. Patient still has mild chest discomfort.    Hospital Course:  Principal Problem:   Pulmonary edema Active Problems:   Chest pain   ESRD (end stage renal  disease)   CAD -S/P LAD BMS 2011, LAD DES 2012- patent cors Feb 2016   Hyperphosphatemia   SOB (shortness of breath)  1. Pulmonary edema probably from fluid overload in a patient with known history of ESRD and medication noncompliant - patient states he has not missed his dialysis. Patient gets dialyzed on Tuesday Thursday and Saturday. He did present with elevated blood pressure which improved with HD and resume of home bp meds. 2. Chest pain with history of CAD status post stenting -  Cardiology input appreciated, renexa discontinued, continue the rest of home meds and outpatient cardiology follow up with Dr. Johnsie Cancel. Negative cardiac markers. Patient is on Imdur which was recently added. Continue antiplatelets agents and statins. 3. Chronic systolic and diastolic heart failure last evening showed was 45-50% with grade 2 diastolic dysfunction last month - fluid management per nephrology. 4. Chronic anemia from renal disease - hgb stable, on iron supplement. 5. Hypertension - continue home medications.   Disposition: d/c home after dialysis here at [facility] Procedures:  HD  Consultations:  Nephrology  cardiology  Discharge Exam: BP 149/88 mmHg  Pulse 90  Temp(Src) 97.9 F (36.6 C) (Oral)  Resp 16  Ht 6' (1.829 m)  Wt 195 lb 5.2 oz (88.6 kg)  BMI 26.49 kg/m2  SpO2 97%  6. General: Moderately built and nourished. 7. Eyes: Anicteric no pallor. 8. ENT: No discharge from the ears eyes nose and mouth. 9. Neck: Mildly elevated JVD resolved, no mass felt. 10. Cardiovascular: S1 and S2 heard. 11. Respiratory: No rhonchi or crepitations. 12. Abdomen:  Soft nontender bowel sounds present. 13. Skin: No rash. 14. Musculoskeletal: No edema. 15. Psychiatric: Appears normal. 16. Neurologic: Alert awake oriented to time place and person. Moves all extremities.  Discharge Instructions You were cared for by a hospitalist during your hospital stay. If you have any questions about your  discharge medications or the care you received while you were in the hospital after you are discharged, you can call the unit and asked to speak with the hospitalist on call if the hospitalist that took care of you is not available. Once you are discharged, your primary care physician will handle any further medical issues. Please note that NO REFILLS for any discharge medications will be authorized once you are discharged, as it is imperative that you return to your primary care physician (or establish a relationship with a primary care physician if you do not have one) for your aftercare needs so that they can reassess your need for medications and monitor your lab values.  Discharge Instructions    Diet - low sodium heart healthy    Complete by:  As directed   Renal diet     Increase activity slowly    Complete by:  As directed             Medication List    STOP taking these medications        ranolazine 500 MG 12 hr tablet  Commonly known as:  RANEXA      TAKE these medications        acetaminophen 500 MG tablet  Commonly known as:  TYLENOL  Take 500 mg by mouth every 6 (six) hours as needed for headache.     amitriptyline 100 MG tablet  Commonly known as:  ELAVIL  Take 1 tablet (100 mg total) by mouth at bedtime.     amLODipine 10 MG tablet  Commonly known as:  NORVASC  Take 10 mg by mouth daily.     aspirin 325 MG tablet  Take 1 tablet (325 mg total) by mouth daily.     atorvastatin 20 MG tablet  Commonly known as:  LIPITOR  Take 20 mg by mouth at bedtime.     carvedilol 25 MG tablet  Commonly known as:  COREG  Take 50 mg by mouth 2 (two) times daily.     cinacalcet 90 MG tablet  Commonly known as:  SENSIPAR  Take 90 mg by mouth at bedtime.     clopidogrel 75 MG tablet  Commonly known as:  PLAVIX  Take 1 tablet (75 mg total) by mouth daily.     COMBIVENT RESPIMAT 20-100 MCG/ACT Aers respimat  Generic drug:  Ipratropium-Albuterol  Inhale 2 puffs into the  lungs every 6 (six) hours as needed for wheezing.     Darbepoetin Alfa 25 MCG/0.42ML Sosy injection  Commonly known as:  ARANESP  Inject 0.42 mLs (25 mcg total) into the vein every Tuesday with hemodialysis.     doxercalciferol 4 MCG/2ML injection  Commonly known as:  HECTOROL  Inject 1 mL (2 mcg total) into the vein Every Tuesday,Thursday,and Saturday with dialysis.     ferric gluconate 62.5 mg in sodium chloride 0.9 % 100 mL  Inject 62.5 mg into the vein every Thursday with hemodialysis.     finasteride 5 MG tablet  Commonly known as:  PROSCAR  Take 5 mg by mouth daily.     isosorbide mononitrate 30 MG 24 hr tablet  Commonly known as:  IMDUR  Take 0.5  tablets (15 mg total) by mouth daily.     lidocaine 5 %  Commonly known as:  LIDODERM  Place 1 patch onto the skin daily. Remove & Discard patch within 12 hours or as directed by MD     meclizine 25 MG tablet  Commonly known as:  ANTIVERT  Take 25 mg by mouth 3 (three) times daily as needed for dizziness.     multivitamin Tabs tablet  Take 1 tablet by mouth at bedtime.     nitroGLYCERIN 0.4 MG SL tablet  Commonly known as:  NITROSTAT  Place 1 tablet (0.4 mg total) under the tongue every 5 (five) minutes as needed for chest pain.     pantoprazole 40 MG tablet  Commonly known as:  PROTONIX  Take 1 tablet (40 mg total) by mouth daily.     pregabalin 50 MG capsule  Commonly known as:  LYRICA  Take 1 capsule (50 mg total) by mouth daily.     sevelamer carbonate 800 MG tablet  Commonly known as:  RENVELA  Take 2,400 mg by mouth 3 (three) times daily with meals.     venlafaxine 37.5 MG tablet  Commonly known as:  EFFEXOR  Take 37.5 mg by mouth 2 (two) times daily.       Allergies  Allergen Reactions  . Enalapril Hives      The results of significant diagnostics from this hospitalization (including imaging, microbiology, ancillary and laboratory) are listed below for reference.    Significant Diagnostic  Studies: Ct Chest Wo Contrast  05/16/2015   CLINICAL DATA:  Chest pain and recent weight loss  EXAM: CT CHEST WITHOUT CONTRAST  TECHNIQUE: Multidetector CT imaging of the chest was performed following the standard protocol without IV contrast.  COMPARISON:  Chest x-ray from earlier in the same day  FINDINGS: The lungs are well aerated bilaterally. The focal infiltrate or sizable effusion is seen. No parenchymal nodules are noted.  The thoracic inlet is within normal limits. Aortic atherosclerotic calcifications are seen without aneurysmal dilatation. Heavy coronary calcifications are noted. No significant hilar or mediastinal adenopathy is noted. A few small lymph nodes are noted in the pretracheal region but demonstrate normal fatty hila.  The upper abdomen shows no acute abnormality. No acute bony abnormality is noted.  IMPRESSION: Aortic atherosclerotic disease and heavy coronary calcifications.  No acute abnormality is identified.   Electronically Signed   By: Inez Catalina M.D.   On: 05/16/2015 13:26   Ct Angio Chest Pe W/cm &/or Wo Cm  05/17/2015   CLINICAL DATA:  Chest pain, shortness of breath.  EXAM: CT ANGIOGRAPHY CHEST WITH CONTRAST  TECHNIQUE: Multidetector CT imaging of the chest was performed using the standard protocol during bolus administration of intravenous contrast. Multiplanar CT image reconstructions and MIPs were obtained to evaluate the vascular anatomy.  CONTRAST:  175mL OMNIPAQUE IOHEXOL 350 MG/ML SOLN  COMPARISON:  CT scan of May 16, 2015.  FINDINGS: No pneumothorax or pleural effusion is noted. No acute pulmonary disease is noted. No definite evidence of pulmonary embolus is noted. Atherosclerosis of thoracic aorta is noted without aneurysm formation. Visualized portion of upper abdomen is unremarkable. No mediastinal mass or adenopathy is noted. No significant osseous abnormality is noted.  Review of the MIP images confirms the above findings.  IMPRESSION: No definite evidence of  pulmonary embolus. No acute cardiopulmonary abnormality seen.   Electronically Signed   By: Marijo Conception, M.D.   On: 05/17/2015 17:54   Nm Myocar  Multi W/spect W/wall Motion / Ef  05/19/2015    There was no ST segment deviation noted during stress.  Defect 1: There is a medium defect of moderate severity.  Findings consistent with prior myocardial infarction.  This is an intermediate risk study.  Nuclear stress EF: 42%.   Intermediate risk study. There moderate sized and intensity fixed, mid to  apical inferolateral wall defect consistent with scar. No significant  reversible ischemia is noted. LVEF 42% with inferolateral hypokinesis.   Dg Chest Port 1 View  06/13/2015   CLINICAL DATA:  Shortness of breath and cough tonight.  EXAM: PORTABLE CHEST - 1 VIEW  COMPARISON:  06/04/2015  FINDINGS: Shallow inspiration with elevation of the left hemidiaphragm. Cardiac enlargement with mild pulmonary vascular congestion. Infiltration in the lung bases likely due to edema. No blunting of costophrenic angles. No pneumothorax.  IMPRESSION: Cardiac enlargement with mild pulmonary vascular congestion and basilar edema.   Electronically Signed   By: Lucienne Capers M.D.   On: 06/13/2015 03:37   Dg Chest Port 1 View  06/04/2015   CLINICAL DATA:  Center chest pain, SOB x1 day. Elevated troponin levels. Hx of HTN, CHF, DM, ESRD on dialysis, CAD.Ex-smoker, quit x2 years ago.  EXAM: PORTABLE CHEST - 1 VIEW  COMPARISON:  05/16/2015  FINDINGS: Cardiac silhouette is mildly enlarged. No mediastinal or hilar masses or evidence of adenopathy.  Clear lungs.  No pleural effusion or pneumothorax.  Skeletal structures are unremarkable.  IMPRESSION: No acute cardiopulmonary disease.   Electronically Signed   By: Lajean Manes M.D.   On: 06/04/2015 12:50   Dg Chest Portable 1 View  05/16/2015   CLINICAL DATA:  59 year old hypertensive diabetic male on dialysis presenting with 3 hours chest pain. Subsequent encounter.  EXAM:  PORTABLE CHEST - 1 VIEW  COMPARISON:  03/31/2015  FINDINGS: Chronically elevated left hemidiaphragm.  Cardiomegaly with pulmonary vascular congestion.  No segmental consolidation or gross pneumothorax.  IMPRESSION: Cardiomegaly with pulmonary vascular congestion.   Electronically Signed   By: Genia Del M.D.   On: 05/16/2015 09:05    Microbiology: Recent Results (from the past 240 hour(s))  MRSA PCR Screening     Status: None   Collection Time: 06/13/15  3:08 PM  Result Value Ref Range Status   MRSA by PCR NEGATIVE NEGATIVE Final    Comment:        The GeneXpert MRSA Assay (FDA approved for NASAL specimens only), is one component of a comprehensive MRSA colonization surveillance program. It is not intended to diagnose MRSA infection nor to guide or monitor treatment for MRSA infections.      Labs: Basic Metabolic Panel:  Recent Labs Lab 06/13/15 0304 06/13/15 1535  NA 136 138  K 4.0 3.8  CL 95* 99*  CO2 26 27  GLUCOSE 100* 125*  BUN 36* 16  CREATININE 9.42* 5.93*  CALCIUM 9.7 9.4   Liver Function Tests:  Recent Labs Lab 06/13/15 0304 06/13/15 1535  AST 14* 17  ALT 12* 13*  ALKPHOS 74 69  BILITOT 0.4 0.9  PROT 7.4 7.6  ALBUMIN 3.7 3.8   No results for input(s): LIPASE, AMYLASE in the last 168 hours. No results for input(s): AMMONIA in the last 168 hours. CBC:  Recent Labs Lab 06/13/15 0304 06/13/15 1535  WBC 7.3 6.2  NEUTROABS 5.3 4.6  HGB 11.5* 11.7*  HCT 36.7* 37.1*  MCV 81.4 80.8  PLT 193 174   Cardiac Enzymes:  Recent Labs Lab 06/13/15 1535 06/13/15  [place] <0.03  <0.03 <0.03   BNP: BNP (last 3 results)  Recent Labs  12/06/14 0845 12/12/14 2100 06/13/15 0304  BNP 86.9 816.3* 672.6*    ProBNP (last 3 results) No results for input(s): PROBNP in the last 8760 hours.  CBG:  Recent Labs Lab 06/13/15 1717 06/13/15 2141 06/14/15 0726  GLUCAP 101* 106* 102*       Signed:  Tationa Stech MD, PhD  [facility] 06/14/2015, 11:36 AM

## 2015-06-14 NOTE — Progress Notes (Signed)
Patient non-compliant with safety measure to prevent falls. Patient asked to notify staff when needing to use bathroom or get up out of bed for any reason. Patient agreed, however, noted more than once to get out of bed without assistance. Attempted to redirect patient and educate patient to no avail. Bed alarm applied. Call bell kept within reach. Patient continues to attempt to get out of bed without assistance, however, the bed alarm reminds him to call for assistance.

## 2015-06-14 NOTE — Progress Notes (Signed)
Pt discharged home.  [facility] RN Joelene Millin arranged for a voucher for a taxi and gave pt his discharge instructions (understanding indicated).  Pt retained possession of all personal effects and none were found in the room after discharge.  Pt was taken to Main Entrance in a wheelchair and sent home in [place] #04.

## 2015-06-14 NOTE — Progress Notes (Signed)
    Subjective:  CP and dyspnea "a little"   Objective:  Filed Vitals:   06/14/15 0000 06/14/15 0400 06/14/15 0500 06/14/15 0724  BP: 151/72 117/73  138/81  Pulse: 96 52    Temp: 98.2 F (36.8 C) 98.1 F (36.7 C)  97.9 F (36.6 C)  TempSrc: Oral Oral  Oral  Resp: 20 20  18   Height:      Weight:   195 lb 5.2 oz (88.6 kg)   SpO2: 96% 100%      Intake/Output from previous day:  Intake/Output Summary (Last 24 hours) at 06/14/15 0859 Last data filed at 06/14/15 0500  Gross per 24 hour  Intake    363 ml  Output   4405 ml  Net  -4042 ml    Physical Exam: Physical exam: Well-developed well-nourished in no acute distress.  Skin is warm and dry. Tattoos HEENT is normal.  Neck is supple.  Chest is clear to auscultation with normal expansion.  Cardiovascular exam is regular rate and rhythm.  Abdominal exam nontender or distended. No masses palpated. Extremities show no edema. neuro grossly intact    Lab Results: Basic Metabolic Panel:  Recent Labs  06/13/15 0304 06/13/15 1535  NA 136 138  K 4.0 3.8  CL 95* 99*  CO2 26 27  GLUCOSE 100* 125*  BUN 36* 16  CREATININE 9.42* 5.93*  CALCIUM 9.7 9.4   CBC:  Recent Labs  06/13/15 0304 06/13/15 1535  WBC 7.3 6.2  NEUTROABS 5.3 4.6  HGB 11.5* 11.7*  HCT 36.7* 37.1*  MCV 81.4 80.8  PLT 193 174   Cardiac Enzymes:  Recent Labs  06/13/15 1535 06/13/15 1950  TROPONINI <0.03  <0.03 <0.03     Assessment/Plan:  1 CP-recent cath with no significant obstruction and nuclear study showing infarct but no ischemia; enzymes negative; plan medical therapy. Continue ASA, plavix, statin, coreg, imdur, norvasc; DC ranexa. 2 ESRD-per nephrology 3 Hypertension-continue present meds  Patient can be DCed from a cardiac standpoint and FU with Dr Johnsie Cancel. Please call with questions. Kirk Ruths 06/14/2015, 8:59 AM

## 2015-07-02 ENCOUNTER — Emergency Department (HOSPITAL_COMMUNITY)
Admission: EM | Admit: 2015-07-02 | Discharge: 2015-07-02 | Disposition: A | Discharge status: Home / Self Care | Payer: Medicare Other | Attending: Emergency Medicine | Admitting: Emergency Medicine

## 2015-07-02 ENCOUNTER — Encounter (HOSPITAL_COMMUNITY): Payer: Self-pay | Admitting: Emergency Medicine

## 2015-07-02 ENCOUNTER — Emergency Department (HOSPITAL_COMMUNITY): Payer: Medicare Other

## 2015-07-02 DIAGNOSIS — I2510 Atherosclerotic heart disease of native coronary artery without angina pectoris: Secondary | ICD-10-CM | POA: Diagnosis not present

## 2015-07-02 DIAGNOSIS — F329 Major depressive disorder, single episode, unspecified: Secondary | ICD-10-CM | POA: Diagnosis not present

## 2015-07-02 DIAGNOSIS — N186 End stage renal disease: Secondary | ICD-10-CM | POA: Diagnosis not present

## 2015-07-02 DIAGNOSIS — R079 Chest pain, unspecified: Secondary | ICD-10-CM | POA: Diagnosis present

## 2015-07-02 DIAGNOSIS — Z9889 Other specified postprocedural states: Secondary | ICD-10-CM | POA: Insufficient documentation

## 2015-07-02 DIAGNOSIS — I120 Hypertensive chronic kidney disease with stage 5 chronic kidney disease or end stage renal disease: Secondary | ICD-10-CM | POA: Insufficient documentation

## 2015-07-02 DIAGNOSIS — I951 Orthostatic hypotension: Secondary | ICD-10-CM | POA: Diagnosis not present

## 2015-07-02 DIAGNOSIS — I5032 Chronic diastolic (congestive) heart failure: Secondary | ICD-10-CM | POA: Diagnosis not present

## 2015-07-02 DIAGNOSIS — Z86010 Personal history of colonic polyps: Secondary | ICD-10-CM | POA: Insufficient documentation

## 2015-07-02 DIAGNOSIS — D649 Anemia, unspecified: Secondary | ICD-10-CM | POA: Diagnosis not present

## 2015-07-02 DIAGNOSIS — Z87891 Personal history of nicotine dependence: Secondary | ICD-10-CM | POA: Insufficient documentation

## 2015-07-02 DIAGNOSIS — E1121 Type 2 diabetes mellitus with diabetic nephropathy: Secondary | ICD-10-CM | POA: Insufficient documentation

## 2015-07-02 DIAGNOSIS — Z992 Dependence on renal dialysis: Secondary | ICD-10-CM | POA: Diagnosis not present

## 2015-07-02 DIAGNOSIS — Z79899 Other long term (current) drug therapy: Secondary | ICD-10-CM | POA: Diagnosis not present

## 2015-07-02 LAB — COMPREHENSIVE METABOLIC PANEL
ALBUMIN: 4 g/dL (ref 3.5–5.0)
ALT: 14 U/L — ABNORMAL LOW (ref 17–63)
ANION GAP: 13 (ref 5–15)
AST: 19 U/L (ref 15–41)
Alkaline Phosphatase: 78 U/L (ref 38–126)
BUN: 12 mg/dL (ref 6–20)
CHLORIDE: 94 mmol/L — AB (ref 101–111)
CO2: 30 mmol/L (ref 22–32)
Calcium: 9.6 mg/dL (ref 8.9–10.3)
Creatinine, Ser: 5.36 mg/dL — ABNORMAL HIGH (ref 0.61–1.24)
GFR calc non Af Amer: 11 mL/min — ABNORMAL LOW (ref >60)
GFR, EST AFRICAN AMERICAN: 12 mL/min — AB (ref >60)
Glucose, Bld: 88 mg/dL (ref 65–99)
Potassium: 3.5 mmol/L (ref 3.5–5.1)
SODIUM: 137 mmol/L (ref 135–145)
Total Bilirubin: 0.4 mg/dL (ref 0.3–1.2)
Total Protein: 8.4 g/dL — ABNORMAL HIGH (ref 6.5–8.1)

## 2015-07-02 LAB — CBC WITH DIFFERENTIAL/PLATELET
BASOS PCT: 1 %
Basophils Absolute: 0 10*3/uL (ref 0.0–0.1)
EOS ABS: 0.2 10*3/uL (ref 0.0–0.7)
EOS PCT: 3 %
HCT: 41.3 % (ref 39.0–52.0)
Hemoglobin: 12.9 g/dL — ABNORMAL LOW (ref 13.0–17.0)
LYMPHS ABS: 1.4 10*3/uL (ref 0.7–4.0)
Lymphocytes Relative: 25 %
MCH: 26.2 pg (ref 26.0–34.0)
MCHC: 31.2 g/dL (ref 30.0–36.0)
MCV: 83.9 fL (ref 78.0–100.0)
MONOS PCT: 13 %
Monocytes Absolute: 0.7 10*3/uL (ref 0.1–1.0)
Neutro Abs: 3.4 10*3/uL (ref 1.7–7.7)
Neutrophils Relative %: 59 %
PLATELETS: 146 10*3/uL — AB (ref 150–400)
RBC: 4.92 MIL/uL (ref 4.22–5.81)
RDW: 19.2 % — ABNORMAL HIGH (ref 11.5–15.5)
WBC: 5.8 10*3/uL (ref 4.0–10.5)

## 2015-07-02 LAB — TROPONIN I: Troponin I: <0.03 ng/mL (ref <0.031)

## 2015-07-02 MED ORDER — SODIUM CHLORIDE 0.9 % IV SOLN: INTRAVENOUS | Status: DC

## 2015-07-02 MED ORDER — SODIUM CHLORIDE 0.9 % IV BOLUS (SEPSIS)
500.0000 mL | Freq: Once | INTRAVENOUS | Status: AC
  Administered 2015-07-02: 500 mL via INTRAVENOUS

## 2015-07-02 NOTE — ED Notes (Signed)
Patient transported to X-ray 

## 2015-07-02 NOTE — Discharge Instructions (Signed)
Your kidney doctor wants you to drink less liquids and to follow-up at dialysis. Return here if you pass out, severe chest pressure that is different than your normal symptoms, or any other problems

## 2015-07-02 NOTE — ED Provider Notes (Signed)
CSN: [id number]     Arrival date & time 07/02/15  1155 History   First MD Initiated Contact with Patient 07/02/15 1204     Chief Complaint  Patient presents with  . Chest Pain     (Consider location/radiation/quality/duration/timing/severity/associated sxs/prior Treatment) HPI Comments: Sx started after he became hypotensive at dialysis and this feels like his prior angina  Patient is a 59 y.o. male presenting with chest pain. The history is provided by the patient.  Chest Pain Pain location:  Substernal area Pain quality: sharp   Pain radiates to the back: no   Pain severity:  Moderate Onset quality:  Sudden Duration:  1 week Timing:  Sporadic Progression:  Waxing and waning Chronicity:  Recurrent Relieved by:  Nothing Worsened by:  Nothing tried Ineffective treatments:  None tried   Past Medical History  Diagnosis Date  . Hypertension   . ESRD on dialysis   . Diabetes mellitus with nephropathy   . Hematochezia     a. 2014: colonscopy, which showed moderately-sized internal hemorrhoids, two 42mm polyps in transverse colon and ascending colon that were resected, five 2-37mm polyps in sigmoid colon, descending colon, transverse colon, and ascending colon that were resected. An upper endoscopy was performed and showed normal esophagus, stomach, and duodenum.  . Hematuria     a. H/o hematuria 2014 with cystoscopy that was unrevealing for his source of hematuria. He underwent a kidney ultrasound on 10/14 that showed mildly echogenic and scarred kidneys compatible with medical renal disease, without hydronephrosis or renal calculi.  [name] Anemia   . Depression   . CAD (coronary artery disease)     a. per [facility] s/p BMS to mid LAD 12/2009 and DES to mid LAD 10/2010.  . Colon polyps   . Chronic diastolic CHF (congestive heart failure)    Past Surgical History  Procedure Laterality Date  . Left heart catheterization with coronary angiogram N/A 11/23/2014    Procedure: LEFT HEART  CATHETERIZATION WITH CORONARY ANGIOGRAM;  Surgeon: Troy Sine, MD;  Location: [facility] CATH LAB;  Service: Cardiovascular;  Laterality: N/A;   Family History  Problem Relation Age of Onset  . Hypertension    . Bone cancer Mother   . Anuerysm Father   . Diabetes type II Daughter    Social History  Substance Use Topics  . Smoking status: Former Smoker    Quit date: 12/06/2010  . Smokeless tobacco: Never Used  . Alcohol Use: No    Review of Systems  Cardiovascular: Positive for chest pain.  All other systems reviewed and are negative.     Allergies  Enalapril  Home Medications   Prior to Admission medications   Medication Sig Start Date End Date Taking? Authorizing Provider  acetaminophen (TYLENOL) 500 MG tablet Take 500 mg by mouth every 6 (six) hours as needed for headache.     Historical Provider, MD  amitriptyline (ELAVIL) 100 MG tablet Take 1 tablet (100 mg total) by mouth at bedtime. 12/15/14   Geradine Girt, DO  amLODipine (NORVASC) 10 MG tablet Take 10 mg by mouth daily.    Historical Provider, MD  aspirin 325 MG tablet Take 1 tablet (325 mg total) by mouth daily. 04/03/15   Orson Eva, MD  atorvastatin (LIPITOR) 20 MG tablet Take 20 mg by mouth at bedtime.     Historical Provider, MD  carvedilol (COREG) 25 MG tablet Take 50 mg by mouth 2 (two) times daily.     Historical Provider, MD  cinacalcet (  SENSIPAR) 90 MG tablet Take 90 mg by mouth at bedtime.     Historical Provider, MD  clopidogrel (PLAVIX) 75 MG tablet Take 1 tablet (75 mg total) by mouth daily. 11/24/14   Juluis Mire, MD  Darbepoetin Alfa (ARANESP) 25 MCG/0.42ML SOSY injection Inject 0.42 mLs (25 mcg total) into the vein every Tuesday with hemodialysis. 12/15/14   Geradine Girt, DO  doxercalciferol (HECTOROL) 4 MCG/2ML injection Inject 1 mL (2 mcg total) into the vein Every Tuesday,Thursday,and Saturday with dialysis. 12/15/14   Geradine Girt, DO  ferric gluconate 62.5 mg in sodium chloride 0.9 % 100 mL Inject  62.5 mg into the vein every Thursday with hemodialysis. 12/16/14   Geradine Girt, DO  finasteride (PROSCAR) 5 MG tablet Take 5 mg by mouth daily.    Historical Provider, MD  Ipratropium-Albuterol (COMBIVENT RESPIMAT) 20-100 MCG/ACT AERS respimat Inhale 2 puffs into the lungs every 6 (six) hours as needed for wheezing.    Historical Provider, MD  isosorbide mononitrate (IMDUR) 30 MG 24 hr tablet Take 0.5 tablets (15 mg total) by mouth daily. 06/05/15   Janece Canterbury, MD  lidocaine (LIDODERM) 5 % Place 1 patch onto the skin daily. Remove & Discard patch within 12 hours or as directed by MD 06/05/15   Janece Canterbury, MD  meclizine (ANTIVERT) 25 MG tablet Take 25 mg by mouth 3 (three) times daily as needed for dizziness.    Historical Provider, MD  multivitamin (RENA-VIT) TABS tablet Take 1 tablet by mouth at bedtime. 12/15/14   Geradine Girt, DO  nitroGLYCERIN (NITROSTAT) 0.4 MG SL tablet Place 1 tablet (0.4 mg total) under the tongue every 5 (five) minutes as needed for chest pain. 06/05/15   Janece Canterbury, MD  pantoprazole (PROTONIX) 40 MG tablet Take 1 tablet (40 mg total) by mouth daily. 06/05/15   Janece Canterbury, MD  pregabalin (LYRICA) 50 MG capsule Take 1 capsule (50 mg total) by mouth daily. 12/15/14   Geradine Girt, DO  sevelamer carbonate (RENVELA) 800 MG tablet Take 2,400 mg by mouth 3 (three) times daily with meals.     Historical Provider, MD  venlafaxine (EFFEXOR) 37.5 MG tablet Take 37.5 mg by mouth 2 (two) times daily.    Historical Provider, MD   BP 106/59 mmHg  Pulse 84  Temp(Src) 98 F (36.7 C)  Resp 16  Ht 6' (1.829 m)  Wt 204 lb (92.534 kg)  BMI 27.66 kg/m2  SpO2 97% Physical Exam  Constitutional: He is oriented to person, place, and time. He appears well-developed and well-nourished.  Non-toxic appearance. No distress.  HENT:  Head: Normocephalic and atraumatic.  Eyes: Conjunctivae, EOM and lids are normal. Pupils are equal, round, and reactive to light.  Neck: Normal  range of motion. Neck supple. No tracheal deviation present. No thyroid mass present.  Cardiovascular: Normal rate, regular rhythm and normal heart sounds.  Exam reveals no gallop.   No murmur heard. Pulmonary/Chest: Effort normal and breath sounds normal. No stridor. No respiratory distress. He has no decreased breath sounds. He has no wheezes. He has no rhonchi. He has no rales.  Abdominal: Soft. Normal appearance and bowel sounds are normal. He exhibits no distension. There is no tenderness. There is no rebound and no CVA tenderness.  Musculoskeletal: Normal range of motion. He exhibits no edema or tenderness.  Neurological: He is alert and oriented to person, place, and time. He has normal strength. No cranial nerve deficit or sensory deficit. GCS eye subscore is  4. GCS verbal subscore is 5. GCS motor subscore is 6.  Skin: Skin is warm and dry. No abrasion and no rash noted.  Psychiatric: He has a normal mood and affect. His speech is normal and behavior is normal.  Nursing note and vitals reviewed.   ED Course  Procedures (including critical care time) Labs Review Labs Reviewed  CBC WITH DIFFERENTIAL/PLATELET  COMPREHENSIVE METABOLIC PANEL  TROPONIN I    Imaging Review No results found. I have personally reviewed and evaluated these images and lab results as part of my medical decision-making.   EKG Interpretation   Date/Time:  Saturday July 02 2015 12:12:33 EDT Ventricular Rate:  86 PR Interval:  144 QRS Duration: 111 QT Interval:  396 QTC Calculation: 474 R Axis:   62 Text Interpretation:  Sinus rhythm Probable left atrial enlargement Left  ventricular hypertrophy ST elevation, consider anterior injury No  significant change since last tracing Confirmed by ALLEN  MD, ANTHONY  ([id number]) on 07/02/2015 2:32:18 PM      MDM   Final diagnoses:  Chest pain   patient relates that he has had ongoing chest pain for approximately 2 years. He had a catheterization done 7  months ago which showed No significant coronary obstructive disease with widely patent LAD stent in the proximal to midsegment with minimal 20% smooth in-stent narrowing and mild 20% ostial narrowing of the first diagonal branch within the stented segment. I do not think that his current symptoms are indicative of ACS.  He however has felt dizzy when standing since Tuesday of this week when he started to have more fluid off during dialysis. Spoke with the nephrologist on call, Dr. Burnett Sheng, he recommends that the patient receive 500 mL of saline here and if he feels better to send him home and initiate the patient to drink less fluids. If the patient remains symptomatic, he recommends medicine admission  Care is signed out to Dr. Dow Adolph, MD 07/02/15 [phone]

## 2015-07-02 NOTE — ED Notes (Signed)
Pt c/o non radiating center chest pain onset Tuesday. Pt attends dialysis and completed a treatment today. Initial BP for EMS was 70/40, pt given 150 ml of NS by EMS. Pt given 324 mg of ASA by EMS.

## 2015-07-02 NOTE — ED Notes (Signed)
Repeat EKG done due to lead reversal done by Nursing student

## 2015-07-02 NOTE — ED Provider Notes (Signed)
Signed out by Dr Zenia Resides at 1600 that d/c instructions done, and that pt can be d/c to home after ivf bolus given.  Fluids given, vitals/bp normal.  No new c/o.     Lajean Saver, MD 07/02/15 [phone]

## 2015-07-21 ENCOUNTER — Encounter (HOSPITAL_COMMUNITY): Payer: Self-pay | Admitting: Emergency Medicine

## 2015-07-21 ENCOUNTER — Observation Stay (HOSPITAL_COMMUNITY)
Admission: EM | Admit: 2015-07-21 | Discharge: 2015-07-22 | Disposition: A | Discharge status: Home / Self Care | Payer: Medicare Other | Attending: Internal Medicine | Admitting: Internal Medicine

## 2015-07-21 ENCOUNTER — Emergency Department (HOSPITAL_COMMUNITY): Payer: Medicare Other

## 2015-07-21 DIAGNOSIS — R9431 Abnormal electrocardiogram [ECG] [EKG]: Secondary | ICD-10-CM

## 2015-07-21 DIAGNOSIS — E1140 Type 2 diabetes mellitus with diabetic neuropathy, unspecified: Secondary | ICD-10-CM | POA: Insufficient documentation

## 2015-07-21 DIAGNOSIS — E1121 Type 2 diabetes mellitus with diabetic nephropathy: Secondary | ICD-10-CM | POA: Insufficient documentation

## 2015-07-21 DIAGNOSIS — Z833 Family history of diabetes mellitus: Secondary | ICD-10-CM | POA: Diagnosis not present

## 2015-07-21 DIAGNOSIS — Z9861 Coronary angioplasty status: Secondary | ICD-10-CM

## 2015-07-21 DIAGNOSIS — Z87891 Personal history of nicotine dependence: Secondary | ICD-10-CM | POA: Diagnosis not present

## 2015-07-21 DIAGNOSIS — I5042 Chronic combined systolic (congestive) and diastolic (congestive) heart failure: Secondary | ICD-10-CM | POA: Insufficient documentation

## 2015-07-21 DIAGNOSIS — R0789 Other chest pain: Secondary | ICD-10-CM | POA: Diagnosis not present

## 2015-07-21 DIAGNOSIS — I5032 Chronic diastolic (congestive) heart failure: Secondary | ICD-10-CM

## 2015-07-21 DIAGNOSIS — F329 Major depressive disorder, single episode, unspecified: Secondary | ICD-10-CM | POA: Diagnosis not present

## 2015-07-21 DIAGNOSIS — Z955 Presence of coronary angioplasty implant and graft: Secondary | ICD-10-CM | POA: Insufficient documentation

## 2015-07-21 DIAGNOSIS — Z79899 Other long term (current) drug therapy: Secondary | ICD-10-CM | POA: Diagnosis not present

## 2015-07-21 DIAGNOSIS — E119 Type 2 diabetes mellitus without complications: Secondary | ICD-10-CM

## 2015-07-21 DIAGNOSIS — Z992 Dependence on renal dialysis: Secondary | ICD-10-CM | POA: Diagnosis not present

## 2015-07-21 DIAGNOSIS — R079 Chest pain, unspecified: Secondary | ICD-10-CM | POA: Diagnosis present

## 2015-07-21 DIAGNOSIS — Z794 Long term (current) use of insulin: Secondary | ICD-10-CM | POA: Insufficient documentation

## 2015-07-21 DIAGNOSIS — I959 Hypotension, unspecified: Secondary | ICD-10-CM | POA: Diagnosis not present

## 2015-07-21 DIAGNOSIS — K219 Gastro-esophageal reflux disease without esophagitis: Secondary | ICD-10-CM | POA: Diagnosis not present

## 2015-07-21 DIAGNOSIS — E1122 Type 2 diabetes mellitus with diabetic chronic kidney disease: Secondary | ICD-10-CM | POA: Diagnosis not present

## 2015-07-21 DIAGNOSIS — I25119 Atherosclerotic heart disease of native coronary artery with unspecified angina pectoris: Secondary | ICD-10-CM | POA: Insufficient documentation

## 2015-07-21 DIAGNOSIS — I10 Essential (primary) hypertension: Secondary | ICD-10-CM | POA: Diagnosis present

## 2015-07-21 DIAGNOSIS — N186 End stage renal disease: Secondary | ICD-10-CM | POA: Diagnosis not present

## 2015-07-21 DIAGNOSIS — Z7982 Long term (current) use of aspirin: Secondary | ICD-10-CM | POA: Diagnosis not present

## 2015-07-21 DIAGNOSIS — Z8639 Personal history of other endocrine, nutritional and metabolic disease: Secondary | ICD-10-CM

## 2015-07-21 DIAGNOSIS — Z7902 Long term (current) use of antithrombotics/antiplatelets: Secondary | ICD-10-CM | POA: Diagnosis not present

## 2015-07-21 DIAGNOSIS — F32A Depression, unspecified: Secondary | ICD-10-CM

## 2015-07-21 DIAGNOSIS — I2510 Atherosclerotic heart disease of native coronary artery without angina pectoris: Secondary | ICD-10-CM | POA: Diagnosis not present

## 2015-07-21 DIAGNOSIS — I120 Hypertensive chronic kidney disease with stage 5 chronic kidney disease or end stage renal disease: Secondary | ICD-10-CM | POA: Diagnosis not present

## 2015-07-21 HISTORY — DX: Cardiac murmur, unspecified: R01.1

## 2015-07-21 HISTORY — DX: Angina pectoris, unspecified: I20.9

## 2015-07-21 HISTORY — DX: Hyperlipidemia, unspecified: E78.5

## 2015-07-21 HISTORY — DX: Respiratory tuberculosis unspecified: A15.9

## 2015-07-21 HISTORY — DX: Dependence on supplemental oxygen: Z99.81

## 2015-07-21 HISTORY — DX: Type 2 diabetes mellitus without complications: E11.9

## 2015-07-21 HISTORY — DX: Headache, unspecified: R51.9

## 2015-07-21 HISTORY — DX: Headache: R51

## 2015-07-21 HISTORY — DX: Personal history of other medical treatment: Z92.89

## 2015-07-21 LAB — BASIC METABOLIC PANEL
Anion gap: 15 (ref 5–15)
BUN: 21 mg/dL — AB (ref 6–20)
CHLORIDE: 89 mmol/L — AB (ref 101–111)
CO2: 30 mmol/L (ref 22–32)
Calcium: 7.8 mg/dL — ABNORMAL LOW (ref 8.9–10.3)
Creatinine, Ser: 6.66 mg/dL — ABNORMAL HIGH (ref 0.61–1.24)
GFR calc Af Amer: 10 mL/min — ABNORMAL LOW (ref >60)
GFR calc non Af Amer: 8 mL/min — ABNORMAL LOW (ref >60)
GLUCOSE: 178 mg/dL — AB (ref 65–99)
POTASSIUM: 6.3 mmol/L — AB (ref 3.5–5.1)
Sodium: 134 mmol/L — ABNORMAL LOW (ref 135–145)

## 2015-07-21 LAB — I-STAT TROPONIN, ED: Troponin i, poc: 0.01 ng/mL (ref 0.00–0.08)

## 2015-07-21 LAB — CBC
HEMATOCRIT: 41.3 % (ref 39.0–52.0)
Hemoglobin: 13.2 g/dL (ref 13.0–17.0)
MCH: 26.4 pg (ref 26.0–34.0)
MCHC: 32 g/dL (ref 30.0–36.0)
MCV: 82.6 fL (ref 78.0–100.0)
Platelets: 206 10*3/uL (ref 150–400)
RBC: 5 MIL/uL (ref 4.22–5.81)
RDW: 18.2 % — AB (ref 11.5–15.5)
WBC: 5.9 10*3/uL (ref 4.0–10.5)

## 2015-07-21 LAB — POTASSIUM: POTASSIUM: 4.2 mmol/L (ref 3.5–5.1)

## 2015-07-21 LAB — TROPONIN I

## 2015-07-21 LAB — MAGNESIUM: MAGNESIUM: 2.5 mg/dL — AB (ref 1.7–2.4)

## 2015-07-21 MED ORDER — SEVELAMER CARBONATE 800 MG PO TABS
2400.0000 mg | ORAL_TABLET | Freq: Three times a day (TID) | ORAL | Status: DC
  Administered 2015-07-22 (×2): 2400 mg via ORAL
  Filled 2015-07-21 (×2): qty 3

## 2015-07-21 MED ORDER — NITROGLYCERIN 0.4 MG SL SUBL
0.4000 mg | SUBLINGUAL_TABLET | SUBLINGUAL | Status: AC | PRN
  Administered 2015-07-21 (×3): 0.4 mg via SUBLINGUAL
  Filled 2015-07-21: qty 1

## 2015-07-21 MED ORDER — DOXERCALCIFEROL 4 MCG/2ML IV SOLN: 2.0000 ug | INTRAVENOUS | Status: DC

## 2015-07-21 MED ORDER — PANTOPRAZOLE SODIUM 40 MG PO TBEC
40.0000 mg | DELAYED_RELEASE_TABLET | Freq: Every day | ORAL | Status: DC
  Administered 2015-07-22: 40 mg via ORAL
  Filled 2015-07-21: qty 1

## 2015-07-21 MED ORDER — RENA-VITE PO TABS: 1.0000 | ORAL_TABLET | Freq: Every day | ORAL | Status: DC

## 2015-07-21 MED ORDER — AMLODIPINE BESYLATE 10 MG PO TABS
10.0000 mg | ORAL_TABLET | Freq: Every day | ORAL | Status: DC
  Administered 2015-07-22: 10 mg via ORAL
  Filled 2015-07-21: qty 1

## 2015-07-21 MED ORDER — ENSURE ENLIVE PO LIQD: 237.0000 mL | Freq: Two times a day (BID) | ORAL | Status: DC

## 2015-07-21 MED ORDER — HEPARIN SODIUM (PORCINE) 5000 UNIT/ML IJ SOLN
5000.0000 [IU] | Freq: Three times a day (TID) | INTRAMUSCULAR | Status: DC
  Administered 2015-07-21 – 2015-07-22 (×3): 5000 [IU] via SUBCUTANEOUS
  Filled 2015-07-21 (×3): qty 1

## 2015-07-21 MED ORDER — MORPHINE SULFATE (PF) 4 MG/ML IV SOLN
6.0000 mg | Freq: Once | INTRAVENOUS | Status: AC
  Administered 2015-07-21: 6 mg via INTRAVENOUS
  Filled 2015-07-21: qty 2

## 2015-07-21 MED ORDER — CLOPIDOGREL BISULFATE 75 MG PO TABS
75.0000 mg | ORAL_TABLET | Freq: Every day | ORAL | Status: DC
  Administered 2015-07-22: 75 mg via ORAL
  Filled 2015-07-21: qty 1

## 2015-07-21 MED ORDER — SODIUM CHLORIDE 0.9 % IV BOLUS (SEPSIS)
500.0000 mL | Freq: Once | INTRAVENOUS | Status: AC
  Administered 2015-07-21: 500 mL via INTRAVENOUS

## 2015-07-21 MED ORDER — AMITRIPTYLINE HCL 50 MG PO TABS
100.0000 mg | ORAL_TABLET | Freq: Every day | ORAL | Status: DC
  Administered 2015-07-21: 100 mg via ORAL
  Filled 2015-07-21: qty 2

## 2015-07-21 MED ORDER — SODIUM CHLORIDE 0.9 % IV SOLN
62.5000 mg | INTRAVENOUS | Status: DC
Start: 2015-07-28 — End: 2015-07-22

## 2015-07-21 MED ORDER — SODIUM POLYSTYRENE SULFONATE 15 GM/60ML PO SUSP
30.0000 g | Freq: Once | ORAL | Status: AC
  Administered 2015-07-21: 30 g via ORAL
  Filled 2015-07-21: qty 120

## 2015-07-21 MED ORDER — PROMETHAZINE HCL 25 MG PO TABS
25.0000 mg | ORAL_TABLET | Freq: Once | ORAL | Status: AC
  Administered 2015-07-21: 25 mg via ORAL
  Filled 2015-07-21: qty 1

## 2015-07-21 MED ORDER — ALBUTEROL SULFATE (2.5 MG/3ML) 0.083% IN NEBU: 2.5000 mg | INHALATION_SOLUTION | Freq: Four times a day (QID) | RESPIRATORY_TRACT | Status: DC | PRN

## 2015-07-21 MED ORDER — ACETAMINOPHEN 500 MG PO TABS: 500.0000 mg | ORAL_TABLET | Freq: Four times a day (QID) | ORAL | Status: DC | PRN

## 2015-07-21 MED ORDER — VENLAFAXINE HCL 37.5 MG PO TABS
37.5000 mg | ORAL_TABLET | Freq: Two times a day (BID) | ORAL | Status: DC
  Administered 2015-07-21 – 2015-07-22 (×3): 37.5 mg via ORAL
  Filled 2015-07-21 (×3): qty 1

## 2015-07-21 MED ORDER — ACETAMINOPHEN 325 MG PO TABS
650.0000 mg | ORAL_TABLET | Freq: Once | ORAL | Status: AC
Start: 2015-07-21 — End: 2015-07-21
  Administered 2015-07-21: 650 mg via ORAL
  Filled 2015-07-21: qty 2

## 2015-07-21 MED ORDER — DARBEPOETIN ALFA 25 MCG/0.42ML IJ SOSY: 25.0000 ug | PREFILLED_SYRINGE | INTRAMUSCULAR | Status: DC

## 2015-07-21 MED ORDER — ATORVASTATIN CALCIUM 20 MG PO TABS
20.0000 mg | ORAL_TABLET | Freq: Every day | ORAL | Status: DC
  Administered 2015-07-21: 20 mg via ORAL
  Filled 2015-07-21: qty 1

## 2015-07-21 MED ORDER — ASPIRIN 81 MG PO CHEW
81.0000 mg | CHEWABLE_TABLET | Freq: Every day | ORAL | Status: DC
  Administered 2015-07-22: 81 mg via ORAL
  Filled 2015-07-21: qty 1

## 2015-07-21 MED ORDER — ASPIRIN 325 MG PO TABS
325.0000 mg | ORAL_TABLET | Freq: Every day | ORAL | Status: DC
Start: 2015-07-21 — End: 2015-07-21

## 2015-07-21 MED ORDER — NITROGLYCERIN 0.4 MG SL SUBL: 0.4000 mg | SUBLINGUAL_TABLET | SUBLINGUAL | Status: DC | PRN

## 2015-07-21 MED ORDER — CINACALCET HCL 30 MG PO TABS
90.0000 mg | ORAL_TABLET | Freq: Every day | ORAL | Status: DC
  Administered 2015-07-21: 90 mg via ORAL
  Filled 2015-07-21: qty 3

## 2015-07-21 MED ORDER — FINASTERIDE 5 MG PO TABS
5.0000 mg | ORAL_TABLET | Freq: Every day | ORAL | Status: DC
Start: 2015-07-22 — End: 2015-07-21

## 2015-07-21 MED ORDER — PREGABALIN 25 MG PO CAPS
50.0000 mg | ORAL_CAPSULE | Freq: Every day | ORAL | Status: DC
  Administered 2015-07-22: 50 mg via ORAL
  Filled 2015-07-21: qty 2

## 2015-07-21 MED ORDER — CARVEDILOL 25 MG PO TABS
50.0000 mg | ORAL_TABLET | Freq: Two times a day (BID) | ORAL | Status: DC
  Administered 2015-07-21 – 2015-07-22 (×2): 50 mg via ORAL
  Filled 2015-07-21 (×2): qty 2

## 2015-07-21 MED ORDER — ISOSORBIDE MONONITRATE ER 30 MG PO TB24
15.0000 mg | ORAL_TABLET | Freq: Every day | ORAL | Status: DC
Start: 2015-07-22 — End: 2015-07-22
  Administered 2015-07-22: 15 mg via ORAL
  Filled 2015-07-21: qty 1

## 2015-07-21 NOTE — ED Notes (Signed)
Report attempted x 1

## 2015-07-21 NOTE — ED Notes (Signed)
Pt reports not feeling well since leaving dialysis today. Pt reports he feels SOB and has chest pains.

## 2015-07-21 NOTE — H&P (Signed)
Date: 07/21/2015               Patient Name:  [name] [name] MRN: [id number]  DOB: [birth year] Age / Sex: 59 y.o., male   PCP: [name]         Medical Service: Internal Medicine Teaching Service         Attending Physician: Dr. Michel Bickers, MD    First Contact: Dr. Lovena Le Pager: [phone]  Second Contact: Dr. Gordy Levan Pager: [phone]       After Hours (After 5p/  First Contact Pager: [phone]  weekends / holidays): Second Contact Pager: [phone]   Chief Complaint: chest pain, hypotension, fluid overload  History of Present Illness: [name] [name] is a 59 yo male with DMII, HTN, ESRD (TTS), combined CHF (EF 45-50% and grade 2 diastolic dysfunction; [id number]) and h/o CAD s/p BMS (2011) and DES (2012).  He presents from dialysis today with multiple nonspecific complaints.  He has been feeling fluid overloaded over the last few weeks, but is unable to describe why he feels this way.  He went to dialysis and hoped to have more fluid taken off.  However, his BP kept dropping during the session.  He was also experiencing cramping pains of his hands, arms, and legs.  He denies orthopnea and only sleeps on one pillow.  He is about 1 pound from his normal weight.  He presented to the ED because he still felt he had extra volume and wanted more volume taken off.  He also reports chest pain occurring during dialysis, along with the cramping.  He states the chest pain has been occurring for about a year.  It is sharp or "tight," sternal pain, radiating to his right and abdomen.  It is minimally relieved with nitroglycerin, but effectively treated with tylenol or ASA.  The pain is nonpleuritic, non-positional, and not reproduced with palpation.  The pains will occur at rest or with exertion. He is able to walk half a mile before experiencing chest pain or SOB.   He believes his chest pain is similar to the pain in 2011 and 2012 that led to his two stents.  However, he had a cardiac cath in February 2016 that  showed no significant coronary obstructive disease with widely patent LAD stent.      He has a history of GERD, for which he takes Protonix PRN.  He endorses cough at night after eating a large meal and lying down.  However, he does not experience chest pain when coughing during these episodes.  He has a pan-positive review of systems.  In the ED, his initial troponin was negative. His potassium was 6.3, but the sample was hemolyzed.  ECG showed new T wave inversions in I and aVR without peaked T-waves.  He was given Kayexalate and a 500 cc NS bolus.  Meds: No current facility-administered medications for this encounter.   Current Outpatient Prescriptions  Medication Sig Dispense Refill  . acetaminophen (TYLENOL) 500 MG tablet Take 500 mg by mouth every 6 (six) hours as needed for headache.     [name] amitriptyline (ELAVIL) 100 MG tablet Take 1 tablet (100 mg total) by mouth at bedtime. 30 tablet 0  . amLODipine (NORVASC) 10 MG tablet Take 10 mg by mouth daily.    [name] aspirin 325 MG tablet Take 1 tablet (325 mg total) by mouth daily. 30 tablet 0  . atorvastatin (LIPITOR) 20 MG tablet Take 20 mg by mouth at bedtime.     [name]  carvedilol (COREG) 25 MG tablet Take 50 mg by mouth 2 (two) times daily.     . cinacalcet (SENSIPAR) 90 MG tablet Take 90 mg by mouth at bedtime.     . clopidogrel (PLAVIX) 75 MG tablet Take 1 tablet (75 mg total) by mouth daily. 30 tablet 11  . Darbepoetin Alfa (ARANESP) 25 MCG/0.42ML SOSY injection Inject 0.42 mLs (25 mcg total) into the vein every Tuesday with hemodialysis. 14.28 mL   . doxercalciferol (HECTOROL) 4 MCG/2ML injection Inject 1 mL (2 mcg total) into the vein Every Tuesday,Thursday,and Saturday with dialysis. 2 mL   . ferric gluconate 62.5 mg in sodium chloride 0.9 % 100 mL Inject 62.5 mg into the vein every Thursday with hemodialysis.    . finasteride (PROSCAR) 5 MG tablet Take 5 mg by mouth daily.    . Ipratropium-Albuterol (COMBIVENT RESPIMAT) 20-100 MCG/ACT AERS  respimat Inhale 2 puffs into the lungs every 6 (six) hours as needed for wheezing.    . isosorbide mononitrate (IMDUR) 30 MG 24 hr tablet Take 0.5 tablets (15 mg total) by mouth daily. 30 tablet 0  . meclizine (ANTIVERT) 25 MG tablet Take 25 mg by mouth 3 (three) times daily as needed for dizziness.    . multivitamin (RENA-VIT) TABS tablet Take 1 tablet by mouth at bedtime. 30 tablet 0  . nitroGLYCERIN (NITROSTAT) 0.4 MG SL tablet Place 1 tablet (0.4 mg total) under the tongue every 5 (five) minutes as needed for chest pain. 30 tablet 0  . pantoprazole (PROTONIX) 40 MG tablet Take 1 tablet (40 mg total) by mouth daily. 30 tablet 0  . pregabalin (LYRICA) 50 MG capsule Take 1 capsule (50 mg total) by mouth daily. 30 capsule 0  . sevelamer carbonate (RENVELA) 800 MG tablet Take 2,400 mg by mouth 3 (three) times daily with meals.     . venlafaxine (EFFEXOR) 37.5 MG tablet Take 37.5 mg by mouth 2 (two) times daily.    [name] lidocaine (LIDODERM) 5 % Place 1 patch onto the skin daily. Remove & Discard patch within 12 hours or as directed by MD (Patient not taking: Reported on 07/21/2015) 30 patch 0    Allergies: Allergies as of 07/21/2015 - Review Complete 07/21/2015  Allergen Reaction Noted  . Enalapril Hives 04/26/2014   Past Medical History  Diagnosis Date  . Hypertension   . ESRD on dialysis ([place])   . Diabetes mellitus with nephropathy ([place])   . Hematochezia     a. 2014: colonscopy, which showed moderately-sized internal hemorrhoids, two 60mm polyps in transverse colon and ascending colon that were resected, five 2-68mm polyps in sigmoid colon, descending colon, transverse colon, and ascending colon that were resected. An upper endoscopy was performed and showed normal esophagus, stomach, and duodenum.  . Hematuria     a. H/o hematuria 2014 with cystoscopy that was unrevealing for his source of hematuria. He underwent a kidney ultrasound on 10/14 that showed mildly echogenic and scarred kidneys  compatible with medical renal disease, without hydronephrosis or renal calculi.  [name] Anemia   . Depression   . CAD (coronary artery disease)     a. per [facility] s/p 3.22mm x 41mm [facility] BMS to mid LAD 12/2009 and Xience DES to mid LAD 10/2010.  . Colon polyps   . Chronic diastolic CHF (congestive heart failure) [facility])    Past Surgical History  Procedure Laterality Date  . Left heart catheterization with coronary angiogram N/A 11/23/2014    Procedure: LEFT HEART CATHETERIZATION WITH CORONARY ANGIOGRAM;  Surgeon: Troy Sine, MD;  Location: [facility] CATH LAB;  Service: Cardiovascular;  Laterality: N/A;   Family History  Problem Relation Age of Onset  . Hypertension    . Bone cancer Mother   . Anuerysm Father   . Diabetes type II Daughter    Social History   Social History  . Marital Status: Unknown    Spouse Name: N/A  . Number of Children: N/A  . Years of Education: N/A   Occupational History  . Not on file.   Social History Main Topics  . Smoking status: Former Smoker -- 0.50 packs/day for 8 years    Types: Cigarettes    Quit date: 12/06/2010  . Smokeless tobacco: Never Used  . Alcohol Use: No  . Drug Use: No  . Sexual Activity: Not on file   Other Topics Concern  . Not on file   Social History Narrative   Moved from [place], [place] to [place] in 11/2014.    Review of Systems: Pertinent items are noted in HPI.  Physical Exam: Blood pressure 132/70, pulse 89, temperature 98.5 F (36.9 C), temperature source Oral, resp. rate 14, SpO2 97 %. Physical Exam  Constitutional: He is oriented to person, place, and time and well-developed, well-nourished, and in no distress. No distress.  HENT:  Head: Normocephalic and atraumatic.  Eyes: EOM are normal. Pupils are equal, round, and reactive to light.  Neck: No tracheal deviation present.  JVD to 1 cm above clavicle with HOB at 60 degrees.  Cardiovascular: Normal rate, regular rhythm, normal heart sounds and intact  distal pulses.   Pulmonary/Chest: Effort normal. No respiratory distress. He has no wheezes.  Minimal crackles at bilateral bases.  Abdominal: Soft. He exhibits no distension. There is no tenderness. There is no rebound and no guarding.  Musculoskeletal: He exhibits no edema.  Neurological: He is alert and oriented to person, place, and time.  Skin: Skin is warm and dry. No rash noted. He is not diaphoretic.     Lab results: Basic Metabolic Panel:  Recent Labs  07/21/15 1416  NA 134*  K 6.3*  CL 89*  CO2 30  GLUCOSE 178*  BUN 21*  CREATININE 6.66*  CALCIUM 7.8*  MG 2.5*   Liver Function Tests: No results for input(s): AST, ALT, ALKPHOS, BILITOT, PROT, ALBUMIN in the last 72 hours. No results for input(s): LIPASE, AMYLASE in the last 72 hours. No results for input(s): AMMONIA in the last 72 hours. CBC:  Recent Labs  07/21/15 1330  WBC 5.9  HGB 13.2  HCT 41.3  MCV 82.6  PLT 206   Cardiac Enzymes: No results for input(s): CKTOTAL, CKMB, CKMBINDEX, TROPONINI in the last 72 hours. BNP: No results for input(s): PROBNP in the last 72 hours. D-Dimer: No results for input(s): DDIMER in the last 72 hours. CBG: No results for input(s): GLUCAP in the last 72 hours. Hemoglobin A1C: No results for input(s): HGBA1C in the last 72 hours. Fasting Lipid Panel: No results for input(s): CHOL, HDL, LDLCALC, TRIG, CHOLHDL, LDLDIRECT in the last 72 hours. Thyroid Function Tests: No results for input(s): TSH, T4TOTAL, FREET4, T3FREE, THYROIDAB in the last 72 hours. Anemia Panel: No results for input(s): VITAMINB12, FOLATE, FERRITIN, TIBC, IRON, RETICCTPCT in the last 72 hours. Coagulation: No results for input(s): LABPROT, INR in the last 72 hours. Urine Drug Screen: Drugs of Abuse     Component Value Date/Time   LABOPIA NONE DETECTED 12/13/2014 0302   COCAINSCRNUR NONE DETECTED 12/13/2014 0302  LABBENZ NONE DETECTED 12/13/2014 0302   AMPHETMU NONE DETECTED 12/13/2014 0302     THCU NONE DETECTED 12/13/2014 0302   LABBARB NONE DETECTED 12/13/2014 0302    Alcohol Level: No results for input(s): ETH in the last 72 hours. Urinalysis: No results for input(s): COLORURINE, LABSPEC, PHURINE, GLUCOSEU, HGBUR, BILIRUBINUR, KETONESUR, PROTEINUR, UROBILINOGEN, NITRITE, LEUKOCYTESUR in the last 72 hours.  Invalid input(s): APPERANCEUR Misc. Labs:   Imaging results:  Dg Chest 2 View  07/21/2015  CLINICAL DATA:  Mid sternal chest pain and shortness of breath today. EXAM: CHEST  2 VIEW COMPARISON:  July 02, 2015 FINDINGS: The heart size and mediastinal contours are stable. Both lungs are clear. The visualized skeletal structures are unremarkable. IMPRESSION: No active cardiopulmonary disease. Electronically Signed   By: Abelardo Diesel M.D.   On: 07/21/2015 14:05    Other results: EKG: T wave inversion in I and aVR.  Assessment & Plan by Problem: Active Problems:   Chest pain  Atypical Chest Pain: Patient presents with sharp pains, sometimes occuring at rest or with exertion.  Pain is relieved with rest, but not nitroglycerin.  It is more effectively relieved with tylenol or ASA.  ECG with T wave inversions in I and aVR, but negative initial troponin. Recent cath demonstrates no significant coronary obstruction.  No concern for PE, as patient not dyspneic or tachycardic, satting 98% on RA.  As patient does have diabetic neuropathy, it is possible he is having atypical symptoms of true angina.  Therefore, we will rule out ACS.  However, more likely etiology is MSK > GERD, as pain is more effectively relieved with tylenol. Patient also has Ipratropium-albuterol inhaler as home med and is complaining of cough, likely adding to MSK pain. - Troponins - ECG in AM - Nitro PRN - Coreg 50 mg BID - Amlodipine 10 mg daily - Imdur 15 mg TID - Protonix daily - Plavix - ASA  Combined CHF: Hard to discern if patient is volume overloaded on exam. He has slight JVD and minimal  crackles on auscultation, but no peripheral edema.  He was also experiencing low BPs at dialysis.   - Coreg 50 mg BID - Amlodipine 10 mg daily - Imdur 15 mg TID - Plavix - ASA - Atorvastatin  DMII with neuropathy: Last A1c 6.1% (Aug 2016).  Patient on Amitriptyline, Lyrica, and Effexor.  He is currently taking both a TCA and SNRI.  No signs or symptoms of seritonin syndrome.  Patient not currently taking any medications to manage DM.  Will CTM - CTM.  ESRD (TTS): Patient s/p dialysis session today, experiencing bouts of low BP and cramping.  Unclear at this time whether his electrolytes are within normal limits.  - Sensipar and Sevelamer  FEN/GI: - Carb modified - Protonix  DVT Ppx: Heparin  Dispo: Disposition is deferred at this time, awaiting improvement of current medical problems. Anticipated discharge in approximately 1 day(s).   The patient does not have a current PCP ([name]) and does need an [facility] hospital follow-up appointment after discharge.  The patient does not have transportation limitations that hinder transportation to clinic appointments.  Signed: Iline Oven, MD, PhD 07/21/2015, 6:06 PM

## 2015-07-21 NOTE — Consult Note (Signed)
CARDIOLOGY CONSULT NOTE   Patient ID: [name] [name] MRN: [id number], DOB/AGE: Jun 21, 1956   Admit date: 07/21/2015 Date of Consult: 07/21/2015 Reason for  Consult: Chest Pain   Primary Physician: [name] Primary Cardiologist: Dr. Alanda Amass - Baldo Ash  HPI: [name] [name] is a 59 y.o. 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  He reports having dyspnea associated with sternal chest pain this morning while undergoing dialysis. He reports this happens frequently and his dialysis often has to be stopped early due to the pain and cramping in his hands and legs. He has the chest pressure frequently, at home and at rest, and takes SL NTG as needed but reports it does not always help the pain. He reports the pain has been present for over 2 years.  He endorses orthopnea and paroxysmal nocturnal dyspnea. Uses O2 at night as needed. He does not weigh himself at home due to not having a scale, but thinks "he might have gained a few pounds over the past few weeks". He also reports having occasional nausea and vomiting but none occurred this morning with his current episode of pain. His chest pain is currently resolved in the ED.  While in the ED, his initial troponin has been negative. Potassium reported at 6.3 but specimen was hemolyzed and a repeat lab is being drawn. EKG shows new T-wave inversion in Leads I and AVL. NSR noted.  Had cardiac catheterization in 11/2014 showing normal LV function, no significant coronary obstructive disease with widely patent LAD stent in the proximal to midsegment with minimal 20% smooth in-stent narrowing and mild 20% ostial narrowing of the first diagonal branch within the stented segment.  Recent Myoview  on 05/19/2015 that was an intermediate risk study. There moderate sized and intensity fixed, mid to apical inferolateral wall defect consistent with scar. No significant reversible ischemia is noted. LVEF 42% with inferolateral hypokinesis.  Recently admitted from 06/13/2015 - 06/14/2015 for chest pain and shortness of breath. Cyclic troponin values were negative.  Represented to the ED on 07/02/2015 for recurrent chest pain and dizziness. He described that pain as being similar to the chest pain he has had for the past two years. Troponin was negative and no ACS workup was indicated at that time.   Problem List  Past Medical History  Diagnosis Date  . Hypertension   . ESRD on dialysis ([place])   . Diabetes mellitus with nephropathy ([place])   . Hematochezia     a. 2014: colonscopy, which showed moderately-sized internal hemorrhoids, two 80mm polyps in transverse colon and ascending colon that were resected, five 2-47mm polyps in sigmoid colon, descending colon, transverse colon, and ascending colon that were resected. An upper endoscopy was performed and showed normal esophagus, stomach, and duodenum.  . Hematuria     a. H/o hematuria 2014 with cystoscopy that was unrevealing for his source of hematuria. He underwent a kidney ultrasound on 10/14 that showed mildly echogenic and scarred kidneys compatible with medical renal disease, without hydronephrosis or renal calculi.  [name] Anemia   . Depression   . CAD (coronary artery disease)     a. per [facility] s/p 3.47mm x 64mm Vision BMS to mid LAD 12/2009 and Xience DES to mid LAD 10/2010.  [name]  Colon polyps   . Chronic diastolic CHF (congestive heart failure) [facility])     Past Surgical History  Procedure Laterality Date  . Left heart catheterization with coronary angiogram N/A 11/23/2014    Procedure: LEFT HEART CATHETERIZATION WITH CORONARY ANGIOGRAM;  Surgeon: Troy Sine, MD;  Location: [facility] CATH LAB;  Service: Cardiovascular;  Laterality: N/A;      Allergies  Allergies  Allergen Reactions  . Enalapril Hives      Inpatient Medications    Family History Family History  Problem Relation Age of Onset  . Hypertension    . Bone cancer Mother   . Anuerysm Father   . Diabetes type II Daughter      Social History Social History   Social History  . Marital Status: Unknown    Spouse Name: N/A  . Number of Children: N/A  . Years of Education: N/A   Occupational History  . Not on file.   Social History Main Topics  . Smoking status: Former Smoker -- 0.50 packs/day for 8 years    Types: Cigarettes    Quit date: 12/06/2010  . Smokeless tobacco: Never Used  . Alcohol Use: No  . Drug Use: No  . Sexual Activity: Not on file   Other Topics Concern  . Not on file   Social History Narrative   Moved from [place], [place] to [place] in 11/2014.     Review of Systems General:  No chills, fever, night sweats or weight changes.  Cardiovascular:  Positive for chest pain, dyspnea on exertion, orthopnea, palpitations, paroxysmal nocturnal dyspnea. Denies edema. Dermatological: No rash, lesions/masses Respiratory: No cough, Positive for dyspnea Urologic: No hematuria, dysuria Abdominal:   No diarrhea, bright red blood per rectum, melena, or hematemesis. Positive for nausea and vomiting. Neurologic:  No visual changes, wkns, changes in mental status. All other systems reviewed and are otherwise negative except as noted above.  Physical Exam  Blood pressure 126/83, pulse 88, temperature 98.5 F (36.9 C), temperature source Oral, resp. rate 21, SpO2 100 %.  General: Pleasant, African American male NAD Psych: Normal affect. Neuro: Alert and oriented X 3. Moves all extremities spontaneously. HEENT: Normal  Neck: Supple without bruits. Positive hepatojugular reflux. Lungs:  Resp regular and unlabored, CTA without wheezing or rales. Heart: RRR no s3, s4, or murmurs. AV fistula sounds noted. Abdomen: Soft, non-tender,  non-distended, BS + x 4.  Extremities: No clubbing, cyanosis or edema. DP/PT/Radials 2+ and equal bilaterally. AV Fistula in place on left.  Labs  No results for input(s): CKTOTAL, CKMB, TROPONINI in the last 72 hours. Lab Results  Component Value Date   WBC 5.9 07/21/2015   HGB 13.2 07/21/2015   HCT 41.3 07/21/2015   MCV 82.6 07/21/2015   PLT 206 07/21/2015     Recent Labs Lab 07/21/15 1416  NA 134*  K 6.3*  CL 89*  CO2 30  BUN 21*  CREATININE 6.66*  CALCIUM 7.8*  GLUCOSE 178*   Lab Results  Component Value Date   CHOL 86 04/01/2015   HDL 22* 04/01/2015   LDLCALC 48 04/01/2015   TRIG 79 04/01/2015   Radiology/Studies  Dg Chest 2 View: 07/21/2015  CLINICAL DATA:  Mid sternal chest pain and shortness of breath today. EXAM: CHEST  2 VIEW COMPARISON:  July 02, 2015 FINDINGS: The heart size and mediastinal contours are stable. Both lungs are clear. The visualized skeletal structures are unremarkable. IMPRESSION: No active cardiopulmonary disease. Electronically Signed   By: Abelardo Diesel  M.D.   On: 07/21/2015 14:05   ECHOCARDIOGRAM: 05/18/2015 Study Conclusions - Left ventricle: The cavity size was mildly dilated. Wall thickness was normal. Systolic function was mildly reduced. The estimated ejection fraction was in the range of 45% to 50%. Mild diffuse hypokinesis with no identifiable regional variations. Features are consistent with a pseudonormal left ventricular filling pattern, with concomitant abnormal relaxation and increased filling pressure (grade 2 diastolic dysfunction). - Aortic valve: There was trivial regurgitation. - Mitral valve: Moderately calcified annulus. There was moderate regurgitation directed centrally. Valve area by pressure half-time: 2.44 cm^2. Valve area by continuity equation (using LVOT flow): 1.39 cm^2. - Left atrium: The atrium was severely dilated. - Right ventricle: The cavity size was moderately dilated.  Systolic function was mildly reduced. - Right atrium: The atrium was mildly dilated.  Impressions: - Direct image comparison with the study from February 2016 confirms a slight reduction in LV systolic function and worsening secondary mitral insufficiency, as well as elevation in mean left atrial pressure.  Cardiac Catheterization: 11/23/2014 Central Aorta: 105/56 Left Ventricle: 105/15  ANGIOGRAPHY:  Left main: Short angiographically normal vessel which immediately bifurcated into the LAD and left circumflex coronary artery.   LAD: Moderate size vessel that had awidely patent proximal to mid LAD stent. There was minimal narrowing of the first diagonal branch at the ostium, which came off in the proximal portion of the stented segment which was not felt to be significant. There was very minimal 10 - 20% narrowing in the mid distal portion of the stent in the region of a small second diagonal branch takeoff. The remainder of the LAD was free of significant disease.  Left circumflex: Large caliber angiographic normal vessel which gave rise to small first marginal and moderate size second marginal branch.  Right coronary artery: Large dominant normal RCA witch supplied a large PDA and PLA system without obstructive disease  Left ventriculography revealed normal LV function. Ejection fraction is 60%. There are no focal segmental wall motion abnormalities. There was no evidence for mitral regurgitation.  Total contrast: 65 cc Omnipaque  IMPRESSION: Normal LV function. No significant coronary obstructive disease with widely patent LAD stent in the proximal to midsegment with minimal 20% smooth in-stent narrowing and mild 20% ostial narrowing of the first diagonal branch within the stented segment.  ASSESSMENT AND PLAN  1. Stable Angina  - history of CAD s/p 3.6mm x 84mm BMS to mid-LAD 12/2009 and Xience DES to mid-LAD in 10/2010 following 80% re-stenosis - has anginal  symptoms at rest and is worse when undergoing dialysis. This has been present for greater than 2 years. - recent cardiac catheterization in 11/2014 showing no significant coronary obstructive disease with widely patent LAD stent in the proximal to midsegment with minimal 20% smooth in-stent narrowing and mild 20% ostial narrowing of the first diagonal branch within the stented segment. Recent Myoview on 05/19/2015 with moderate sized and intensity fixed, mid to apical inferolateral wall defect consistent with scar. No significant reversible ischemia noted. - Initial troponin has been negative. EKG shows new T-wave inversion in Leads I and AVL. NSR noted. - obtain cyclic troponin values. - Continue ASA, statin, BB, Plavix, and Imdur. - Currently on Imdur 15mg  daily. Consider increasing to 30mg  daily and further increasing as BP allows. - Had been on Ranexa but this was discontinued on 06/14/2015. May need to consider adding it back to his medication regimen following titration of Imdur.  2. Chronic Combined Systolic and Diastolic CHF - has minimal  hepatojugular reflux but no edema or rales on examination. - Recent Echo in 05/2015 showing EF of 45% to 50%. Mild diffuse hypokinesis with no identifiable regional variations and abnormal relaxation with increased filling pressure, consistent with grade 2 diastolic dysfunction. - continue current medication regimen  3.  ESRD - on dialysis Tuesday, Thursday, Saturday. - will need to get better control of angina so he can actually receive his full sessions of dialysis.   Signed, Erma Heritage, PA-C 07/21/2015, 5:05 PM Pager: [phone]  I saw and examined the patient along with Ms. Ahmed Prima, [place]. Patient is well-known to our service for recurrent episodes of hospitalizations for chest pain. He is actually had a recent cardiac catheterization showing patent stents in the LAD. Initially had a bare-metal stent placed in the mid LAD that was noted to  have in-stent restenosis about a year later in March 2012 and that was treated with Xience struggling stent. All done at [facility].  His episodes often occur with dialysis. Apparently they did not complete his dialysis today. He has had more sensation of dyspnea than actual chest pain. At this point my recommendation would be to ensure that he has full dialysis if that means we need to hold off on blood pressure medications on dialysis days to allow for full dialysis then medications need to be held. Otherwise for an antianginal effect I would increase his Imdur dose and can consider Ranexa. Immediately clarify with pharmacy as to the effectiveness of Ranexa on dialysis patients however.   While his chest pain is difficult to fully determine the true etiology, it could certainly be microvascular ischemia as post a macrovascular. He is had a recent nuclear stress test and recent catheterization, therefore my suspicion is very low that this is actually macrovascular ischemia. More likely microvascular ischemia with secondary diastolic dysfunction leading to his dyspnea on his dialysis days and dialysis mornings.  Ultimately, if classic anginal medications are not helpful one must consider this may not be angina and could potentially muscle skeletal as well. Regardless analgesics will be the optimal treatment for presumed muscular skeletal pain versus considering narcotics for anginal pain.  If he rules out for MI my recommendation would be for him to complete dialysis and potentially consider discharge. Further evaluation per primary team.   Leonie Man, MD Leonie Man, M.D., M.S. Interventional Cardiologist   Pager # [phone]

## 2015-07-21 NOTE — ED Notes (Addendum)
Cardiology at bedside.

## 2015-07-21 NOTE — ED Notes (Signed)
Admitting at bedside 

## 2015-07-21 NOTE — Consult Note (Signed)
   [name] CM Inpatient Consult   07/21/2015  [name] [name] 1955-12-01 [id number]   Thank you for this consult.   This patient is Not eligible for [facility] Care Management Services.   Reason:  Not a beneficiary currently attributed to one of the [place].  Membership roster was used to verify non- eligible status. For questions, please contact: Natividad Brood, RN BSN [facility] Liaison  [phone] business mobile phone

## 2015-07-21 NOTE — ED Notes (Signed)
Report attempted x2

## 2015-07-21 NOTE — ED Provider Notes (Signed)
CSN: [id number]     Arrival date & time 07/21/15  1256 History   First MD Initiated Contact with Patient 07/21/15 1325     Chief Complaint  Patient presents with  . Chest Pain  . Shortness of Breath     (Consider location/radiation/quality/duration/timing/severity/associated sxs/prior Treatment) HPI Comments: Central chest pressure began while at dialysis. Patient feels like he's had extra fluid on, dry weight if 87 kg, was 91 kg this morning. No missed dialysis recently. No alleviating or exacerbating factors. Had low BP at dialysis and began having CP. Feels similar to prior cardiac pain.   Copied from recent Cards note: Heart cath 11/2014 with no significant coronary obstructive disease with widely patent LAD stent in the proximal to midsegment with minimal 20% smooth in-stent narrowing and mild 20% ostial narrowing of the first diagonal branch within the stented segment    Patient is a 59 y.o. male presenting with chest pain and shortness of breath. The history is provided by the patient.  Chest Pain Pain location:  Substernal area Pain quality: pressure   Pain radiates to:  Does not radiate Pain severity:  Moderate Onset quality:  Gradual Timing:  Constant Progression:  Unchanged Chronicity:  New Associated symptoms: no abdominal pain, no cough, no fever, no shortness of breath and not vomiting   Shortness of Breath Associated symptoms: chest pain   Associated symptoms: no abdominal pain, no cough, no fever and no vomiting     Past Medical History  Diagnosis Date  . Hypertension   . ESRD on dialysis ([place])   . Diabetes mellitus with nephropathy ([place])   . Hematochezia     a. 2014: colonscopy, which showed moderately-sized internal hemorrhoids, two 47mm polyps in transverse colon and ascending colon that were resected, five 2-70mm polyps in sigmoid colon, descending colon, transverse colon, and ascending colon that were resected. An upper endoscopy was performed and showed  normal esophagus, stomach, and duodenum.  . Hematuria     a. H/o hematuria 2014 with cystoscopy that was unrevealing for his source of hematuria. He underwent a kidney ultrasound on 10/14 that showed mildly echogenic and scarred kidneys compatible with medical renal disease, without hydronephrosis or renal calculi.  [name] Anemia   . Depression   . CAD (coronary artery disease)     a. per [facility] s/p BMS to mid LAD 12/2009 and DES to mid LAD 10/2010.  . Colon polyps   . Chronic diastolic CHF (congestive heart failure) [facility])    Past Surgical History  Procedure Laterality Date  . Left heart catheterization with coronary angiogram N/A 11/23/2014    Procedure: LEFT HEART CATHETERIZATION WITH CORONARY ANGIOGRAM;  Surgeon: Troy Sine, MD;  Location: [facility] CATH LAB;  Service: Cardiovascular;  Laterality: N/A;   Family History  Problem Relation Age of Onset  . Hypertension    . Bone cancer Mother   . Anuerysm Father   . Diabetes type II Daughter    Social History  Substance Use Topics  . Smoking status: Former Smoker    Quit date: 12/06/2010  . Smokeless tobacco: Never Used  . Alcohol Use: No    Review of Systems  Constitutional: Negative for fever.  Respiratory: Negative for cough and shortness of breath.   Cardiovascular: Positive for chest pain.  Gastrointestinal: Negative for vomiting and abdominal pain.  All other systems reviewed and are negative.     Allergies  Enalapril  Home Medications   Prior to Admission medications   Medication Sig Start  Date End Date Taking? Authorizing Provider  acetaminophen (TYLENOL) 500 MG tablet Take 500 mg by mouth every 6 (six) hours as needed for headache.     Historical Provider, MD  amitriptyline (ELAVIL) 100 MG tablet Take 1 tablet (100 mg total) by mouth at bedtime. 12/15/14   Geradine Girt, DO  amLODipine (NORVASC) 10 MG tablet Take 10 mg by mouth daily.    Historical Provider, MD  aspirin 325 MG tablet Take 1 tablet (325 mg total)  by mouth daily. 04/03/15   Orson Eva, MD  atorvastatin (LIPITOR) 20 MG tablet Take 20 mg by mouth at bedtime.     Historical Provider, MD  carvedilol (COREG) 25 MG tablet Take 50 mg by mouth 2 (two) times daily.     Historical Provider, MD  cinacalcet (SENSIPAR) 90 MG tablet Take 90 mg by mouth at bedtime.     Historical Provider, MD  clopidogrel (PLAVIX) 75 MG tablet Take 1 tablet (75 mg total) by mouth daily. 11/24/14   Juluis Mire, MD  Darbepoetin Alfa (ARANESP) 25 MCG/0.42ML SOSY injection Inject 0.42 mLs (25 mcg total) into the vein every Tuesday with hemodialysis. 12/15/14   Geradine Girt, DO  doxercalciferol (HECTOROL) 4 MCG/2ML injection Inject 1 mL (2 mcg total) into the vein Every Tuesday,Thursday,and Saturday with dialysis. 12/15/14   Geradine Girt, DO  ferric gluconate 62.5 mg in sodium chloride 0.9 % 100 mL Inject 62.5 mg into the vein every Thursday with hemodialysis. 12/16/14   Geradine Girt, DO  finasteride (PROSCAR) 5 MG tablet Take 5 mg by mouth daily.    Historical Provider, MD  Ipratropium-Albuterol (COMBIVENT RESPIMAT) 20-100 MCG/ACT AERS respimat Inhale 2 puffs into the lungs every 6 (six) hours as needed for wheezing.    Historical Provider, MD  isosorbide mononitrate (IMDUR) 30 MG 24 hr tablet Take 0.5 tablets (15 mg total) by mouth daily. 06/05/15   Janece Canterbury, MD  lidocaine (LIDODERM) 5 % Place 1 patch onto the skin daily. Remove & Discard patch within 12 hours or as directed by MD 06/05/15   Janece Canterbury, MD  meclizine (ANTIVERT) 25 MG tablet Take 25 mg by mouth 3 (three) times daily as needed for dizziness.    Historical Provider, MD  multivitamin (RENA-VIT) TABS tablet Take 1 tablet by mouth at bedtime. 12/15/14   Geradine Girt, DO  nitroGLYCERIN (NITROSTAT) 0.4 MG SL tablet Place 1 tablet (0.4 mg total) under the tongue every 5 (five) minutes as needed for chest pain. 06/05/15   Janece Canterbury, MD  pantoprazole (PROTONIX) 40 MG tablet Take 1 tablet (40 mg total) by  mouth daily. 06/05/15   Janece Canterbury, MD  pregabalin (LYRICA) 50 MG capsule Take 1 capsule (50 mg total) by mouth daily. 12/15/14   Geradine Girt, DO  sevelamer carbonate (RENVELA) 800 MG tablet Take 2,400 mg by mouth 3 (three) times daily with meals.     Historical Provider, MD  venlafaxine (EFFEXOR) 37.5 MG tablet Take 37.5 mg by mouth 2 (two) times daily.    Historical Provider, MD   BP 122/74 mmHg  Pulse 88  Temp(Src) 98.5 F (36.9 C) (Oral)  Resp 20  SpO2 96% Physical Exam  Constitutional: He is oriented to person, place, and time. He appears well-developed and well-nourished. No distress.  HENT:  Head: Normocephalic and atraumatic.  Mouth/Throat: No oropharyngeal exudate.  Eyes: EOM are normal. Pupils are equal, round, and reactive to light.  Neck: Normal range of motion. Neck supple.  Cardiovascular: Normal rate and regular rhythm.  Exam reveals no friction rub.   No murmur heard. Pulmonary/Chest: Effort normal and breath sounds normal. No respiratory distress. He has no wheezes. He has no rales.  Abdominal: Soft. He exhibits no distension. There is no tenderness. There is no rebound.  Musculoskeletal: Normal range of motion. He exhibits no edema.  Neurological: He is alert and oriented to person, place, and time.  Skin: No rash noted. He is not diaphoretic.  Nursing note and vitals reviewed.   ED Course  Procedures (including critical care time) Labs Review Labs Reviewed  CBC - Abnormal; Notable for the following:    RDW 18.2 (*)    All other components within normal limits  BASIC METABOLIC PANEL - Abnormal; Notable for the following:    Sodium 134 (*)    Potassium 6.3 (*)    Chloride 89 (*)    Glucose, Bld 178 (*)    BUN 21 (*)    Creatinine, Ser 6.66 (*)    Calcium 7.8 (*)    GFR calc non Af Amer 8 (*)    GFR calc Af Amer 10 (*)    All other components within normal limits  MAGNESIUM  I-STAT TROPOININ, ED    Imaging Review Dg Chest 2 View  07/21/2015   CLINICAL DATA:  Mid sternal chest pain and shortness of breath today. EXAM: CHEST  2 VIEW COMPARISON:  July 02, 2015 FINDINGS: The heart size and mediastinal contours are stable. Both lungs are clear. The visualized skeletal structures are unremarkable. IMPRESSION: No active cardiopulmonary disease. Electronically Signed   By: Abelardo Diesel M.D.   On: 07/21/2015 14:05   I have personally reviewed and evaluated these images and lab results as part of my medical decision-making.   EKG Interpretation   Date/Time:  Thursday July 21 2015 13:11:43 EDT Ventricular Rate:  96 PR Interval:  140 QRS Duration: 104 QT Interval:  388 QTC Calculation: 490 R Axis:   64 Text Interpretation:  Normal sinus rhythm Minimal voltage criteria for  LVH, may be normal variant Lateral infarct , age undetermined Abnormal ECG   T wave inversions worse laterally.  Confirmed by Mingo Amber  MD, Veteran [phone])  on 07/21/2015 4:19:47 PM      MDM   Final diagnoses:  Chest pain, unspecified chest pain type  EKG abnormalities    59 year old male here with chest pain. Began on dialysis. He is hypertensive to the 70s at dialysis. Initial blood pressure 99/65. He had 3-1/2 hours of treatment, normal has 4 hours. He feels like he's had more fluid on recently. States his dry weight is 87 kg, he is 91 kg today when he went in. No Rales, signs of volume overload. Will give fluid back and some morphine to see if we can help his chest pain. He's had this similarly about 3 weeks ago, had normal labs and was sent home after mild fluid resuscitation.   EKG here with some worsening T wave inversions laterally. Labs show hyperkalemia - but was hemolyzed, kayexalate given. No EKG changes c/w hyperkalemia, no need for calcium.  Troponin ok. Medicine admitting for his EKG changes, Cards consulted.   Evelina Bucy, MD 07/21/15 1620

## 2015-07-21 NOTE — H&P (Signed)
Date: 07/21/2015               Patient Name:  [name] [name] MRN: [id number]  DOB: 1955/12/23 Age / Sex: 59 y.o., male   PCP: [name]         Medical Service: Internal Medicine Teaching Service         Attending Physician: Dr. Michel Bickers, MD    First Contact: Colin Ina, MS3 Pager: [phone]  Second Contact: Dr. Viviano Simas         After Hours (After 5p/  First Contact Pager: [phone]  weekends / holidays): Second Contact Pager: [phone]   Chief Complaint: Chest pain, dizziness, low blood pressure after dialysis, muscle cramping  History of Present Illness: [name] [name] (preferred name [name]) is a 59 y.o man with PMH of CAD s/p stent placement in 2011 and 2012, hypertension with ESRD on dialysis T,TH,S and diabetes who presented to the ED today with multiple complaints, most significantly chest pain. He was recently admitted to the hospital on 06/13/2015 for similar complaints and patient was discharged on 06/14/2015 after extensive cardiac workup. [name] [name] says he only felt well and like his best self for a few days after discharge from the hospital, and since then he has been progressively feeling worse. He went to the ED on 9/24 for recurrent chest pain and weakness- troponins were negative, and ACS workup was not indicated. Over the past two weeks, he's had intermittent muscle cramping in his hands and legs, chest pain and weakness during his regular dialysis sessions.  The patient describes his chest pain as both 'crampy' and 'knife-like' and radiates to the right arm and right lower abdomen. The chest pain is not worse during inspiration, and is not improved when he sits forward. When the chest pain hits, he sits down, takes a Tylenol, and about 30-45 minutes later the chest pain goes away. He occasionally takes a nitroglycerin for the chest pain, but has not taken any in the past three weeks. He says the nitroglycerin helps 'a little', but the tylenol seems to help  more.   Over the past two weeks, he has noticed more fluid in his legs and feels he 'isn't getting enough fluid off during dialysis'. He only sleeps with one pillow, but does havedifficulty laying flat, and does better when laying on his stomach or side. He says his weight has not changed very much over the past few weeks, possibly 'a pound or two'. He says that he does wake up during the middle of the night short of breath and cannot get back to sleep but this is not any worse than it was at his baseline. He also mentions that he's been nauseated over the past two weeks, and had one episode of emesis earlier today.   Today, [name] [name] went to dialysis as usual, but was having intense cramping in his chest, hands, and legs and after numerous breaks during the dialysis session, he stopped the session about ten minutes early due to intense pain. He also mentions his blood pressure was very low during this time. during dialysis that caused him to ask to stop the treatment numerous times. He says that he was having cramping in his hands, his legs, and his chest. He mentions his blood pressure was very low at the time. When he went home after dialysis, his neighbor came over and said he didn't look well, so he decided he should go to the hospital for evaluation.  On arrival to the ED, he was hypotensive to 99/62mmHg. He was given fluid, nitroglycerin and morphine. CBC and BMP were collected, K+ was elevated to 6.5 and Kayexalate was given, but blood draw was hemolyzed and not reflective of a true potassium. EKG was obtained which showed T wave inversions laterally and not consistent with hyperkalemia. Troponins were negative. Cardiology was consulted and patient was admitted to the Internal Medicine Teaching Team Service for observation.   This is [name] [name] 5th admission in a 6 month period, and he does not currently have a PCP. When asked what his biggest concerns were, he mentions his worsening heart disease,  lack of PCP, and being on too many medications. The patient has had multiple PCP appointments arranged for him and it appears he has missed them all.   Meds:    Current Outpatient Prescriptions  Medication Sig Dispense Refill  . acetaminophen (TYLENOL) 500 MG tablet Take 500 mg by mouth every 6 (six) hours as needed for headache.     [name] amitriptyline (ELAVIL) 100 MG tablet Take 1 tablet (100 mg total) by mouth at bedtime. 30 tablet 0  . amLODipine (NORVASC) 10 MG tablet Take 10 mg by mouth daily.    [name] aspirin 325 MG tablet Take 1 tablet (325 mg total) by mouth daily. 30 tablet 0  . atorvastatin (LIPITOR) 20 MG tablet Take 20 mg by mouth at bedtime.     . carvedilol (COREG) 25 MG tablet Take 50 mg by mouth 2 (two) times daily.     . cinacalcet (SENSIPAR) 90 MG tablet Take 90 mg by mouth at bedtime.     . clopidogrel (PLAVIX) 75 MG tablet Take 1 tablet (75 mg total) by mouth daily. 30 tablet 11  . Darbepoetin Alfa (ARANESP) 25 MCG/0.42ML SOSY injection Inject 0.42 mLs (25 mcg total) into the vein every Tuesday with hemodialysis. 14.28 mL   . doxercalciferol (HECTOROL) 4 MCG/2ML injection Inject 1 mL (2 mcg total) into the vein Every Tuesday,Thursday,and Saturday with dialysis. 2 mL   . ferric gluconate 62.5 mg in sodium chloride 0.9 % 100 mL Inject 62.5 mg into the vein every Thursday with hemodialysis.    . finasteride (PROSCAR) 5 MG tablet Take 5 mg by mouth daily.    . Ipratropium-Albuterol (COMBIVENT RESPIMAT) 20-100 MCG/ACT AERS respimat Inhale 2 puffs into the lungs every 6 (six) hours as needed for wheezing.    . isosorbide mononitrate (IMDUR) 30 MG 24 hr tablet Take 0.5 tablets (15 mg total) by mouth daily. 30 tablet 0  . meclizine (ANTIVERT) 25 MG tablet Take 25 mg by mouth 3 (three) times daily as needed for dizziness.    . multivitamin (RENA-VIT) TABS tablet Take 1 tablet by mouth at bedtime. 30 tablet 0  . nitroGLYCERIN (NITROSTAT) 0.4 MG SL tablet Place 1 tablet (0.4 mg total) under  the tongue every 5 (five) minutes as needed for chest pain. 30 tablet 0  . pantoprazole (PROTONIX) 40 MG tablet Take 1 tablet (40 mg total) by mouth daily. 30 tablet 0  . pregabalin (LYRICA) 50 MG capsule Take 1 capsule (50 mg total) by mouth daily. 30 capsule 0  . sevelamer carbonate (RENVELA) 800 MG tablet Take 2,400 mg by mouth 3 (three) times daily with meals.     . venlafaxine (EFFEXOR) 37.5 MG tablet Take 37.5 mg by mouth 2 (two) times daily.    [name] lidocaine (LIDODERM) 5 % Place 1 patch onto the skin daily. Remove & Discard patch within  12 hours or as directed by MD (Patient not taking: Reported on 07/21/2015) 30 patch 0    Allergies: Allergies as of 07/21/2015 - Review Complete 07/21/2015  Allergen Reaction Noted  . Enalapril Hives 04/26/2014   Past Medical History  Diagnosis Date  . Hypertension   . ESRD on dialysis ([place])   . Diabetes mellitus with nephropathy ([place])   . Hematochezia     a. 2014: colonscopy, which showed moderately-sized internal hemorrhoids, two 80mm polyps in transverse colon and ascending colon that were resected, five 2-59mm polyps in sigmoid colon, descending colon, transverse colon, and ascending colon that were resected. An upper endoscopy was performed and showed normal esophagus, stomach, and duodenum.  . Hematuria     a. H/o hematuria 2014 with cystoscopy that was unrevealing for his source of hematuria. He underwent a kidney ultrasound on 10/14 that showed mildly echogenic and scarred kidneys compatible with medical renal disease, without hydronephrosis or renal calculi.  [name] Anemia   . Depression   . CAD (coronary artery disease)     a. per [facility] s/p 3.66mm x 73mm Vision BMS to mid LAD 12/2009 and Xience DES to mid LAD 10/2010.  . Colon polyps   . Chronic diastolic CHF (congestive heart failure) [facility])    Past Surgical History  Procedure Laterality Date  . Left heart catheterization with coronary angiogram N/A 11/23/2014    Procedure: LEFT HEART  CATHETERIZATION WITH CORONARY ANGIOGRAM;  Surgeon: Troy Sine, MD;  Location[facility] C[facility];  Service: Cardiovascular;  Laterality: N/A;   Family History  Problem Relation Age of Onset  . Hypertension    . Bone cancer Mother   . Anuerysm Father   . Diabetes type II Daughter    Social History   Social History  . Marital Status: Patient has been divorced 2x    Spouse Name: N/A  . Number of Children: 4  . Years of Education: unknown   Occupational History  . 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 Smoking status: Former Smoker -- 0.50 packs/day for 8 years    Types: Cigarettes    Quit date: 12/06/2010  . Smokeless tobacco: Never Used  . Alcohol Use: Used to drink until he quit in early 2000s  . Drug Use: History of Mariajuana use, none since 2006  . Sexual Activity: Denies   Social History: Mr. Loehrer recently moved to [place] from [place], [place] in February of 2016. 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 He moved to [facility] right after his diagnosis to be closer to his children. He then moved back to [place] to be with a dear friend who was also undergoing dialysis treatment as well, but who was not doing well. His friend ultimately decided he did not want dialysis treatment anymore and passed away. Mr. Giaimo took this very hard and says he's been depressed recently. He lost 80 lbs over the past year without trying. His son convinced him to come live with him in [place].  He currently lives with his son, Jori Moll Allsup), his girlfriend, and their daughter MuMu. The patient states that both his daughter and his girlfriend smoke heavily indoors. His son is a Administrator who travels often. The patient also mentioned that he was a twin, and that his twin has a very similar medical history.  Review of Systems:The patient reports he has head ache,  vision changes, dry mouth, dry eyes, dry skin, shortness of breath, chest pain, stomach pain, acid reflux, tingling in his hands and feet, cramping of his hands and feet.    Physical Exam: Blood pressure 132/70, pulse 89, temperature 98.5 F (36.9 C), temperature source Oral, resp. rate 14, SpO2 97 %. Physical Exam  Constitutional: He is oriented to person, place, and time and well-developed, well-nourished, and in no distress. No distress.  HENT:  Head: Normocephalic and atraumatic.  Oral: Poorly fitting dentures, atrophic mandibular ridge, heavily coated white tongue, dry mucous membranes.  Eyes: EOM are normal. Pupils are equal, round, and reactive to light.  Neck: No tracheal deviation present. JVD to 1 cm above clavicle with HOB at 60 degrees.  Cardiovascular: Normal rate, regular rhythm, normal heart sounds and intact distal pulses. No murmurs noted.   Pulmonary/Chest: Effort normal. No respiratory distress. He has no wheezes.  Minimal crackles at bilateral bases.  Abdominal: Soft. He exhibits no distension. There is no tenderness. There is no rebound and no guarding.  Musculoskeletal: He exhibits no edema.  Ext: open cut on left toe nail about 58mm. Neurological: He is alert and oriented to person, place, and time.  Skin: Skin is warm and dry. No rash noted. He is not diaphoretic. Patient is heavily tattooed.   Lab results: While in the ED, his initial troponin has been negative. Potassium reported at 6.3 but specimen was hemolyzed and a repeat lab is being drawn.     Component Value Date/Time   WBC 5.9 07/21/2015 1330   RBC 5.00 07/21/2015 1330   RBC 4.19* 11/23/2014 0426   HGB 13.2 07/21/2015 1330   HCT 41.3 07/21/2015 1330   PLT 206 07/21/2015 1330   MCV 82.6 07/21/2015 1330   MCH 26.4 07/21/2015 1330   MCHC 32.0 07/21/2015 1330   RDW 18.2* 07/21/2015 1330   LYMPHSABS 1.4 07/02/2015 1332   MONOABS 0.7 07/02/2015 1332   EOSABS 0.2 07/02/2015 1332   BASOSABS 0.0  07/02/2015 1332   BMP Latest Ref Rng 07/21/2015 07/02/2015 06/14/2015  Glucose 65 - 99 mg/dL 178(H) 88 111(H)  BUN 6 - 20 mg/dL 21(H) 12 32(H)  Creatinine 0.61 - 1.24 mg/dL 6.66(H) 5.36(H) 8.15(H)  Sodium 135 - 145 mmol/L 134(L) 137 137  Potassium 3.5 - 5.1 mmol/L 6.3(HH) 3.5 4.7  Chloride 101 - 111 mmol/L 89(L) 94(L) 98(L)  CO2 22 - 32 mmol/L 30 30 29   Calcium 8.9 - 10.3 mg/dL 7.8(L) 9.6 9.1     07/21/15  TROPIPOC 0.01      Component Value Date/Time   BNP 672.6* 06/13/2015 0304    ProBNP No results found for: PROBNP    Imaging results:  Dg Chest 2 View  07/21/2015  CLINICAL DATA:  Mid sternal chest pain and shortness of breath today. EXAM: CHEST  2 VIEW COMPARISON:  July 02, 2015 FINDINGS: The heart size and mediastinal contours are stable. Both lungs are clear. The visualized skeletal structures are unremarkable. IMPRESSION: No active cardiopulmonary disease. Electronically Signed   By: Abelardo Diesel M.D.   On: 07/21/2015 14:05    Other results: EKG:  EKG shows new T-wave inversion in Leads I and AVL. NSR noted.  Assessment & Plan by Problem:  Atypical Chest Pain: Cramping, stabbing chest pain during dialysis with reported hypotension, not relieved by nitroglycerin, but improved with acetaminophen.  S/p bolus, nitroglycerin and morphine.EKG showed T wave inversions laterally and not consistent with  hyperkalemia. Troponins were negative. Cardiology was consulted, appreciate recommendation.  The patient does have a history of CAD s/p 3.53mm x 47mm BMS to mid-LAD 12/2009 and Xience DES to mid-LAD in 10/2010 following 80% re-stenosis. Myoview on 05/19/2015 wasan intermediate risk study. There moderate sized and intensity fixed, mid to apical inferolateral wall defect consistent with scar. No significant reversible ischemia is noted. LVEF 42% with inferolateral hypokinesis. Despite this patient's extensive cardiac history, it seems the patient's current presentation is unlikely  Acute Coronary Syndrome given the nature of his pain and the extensive duration.  Typical Agina is defined by 1) substernal chest discomfort 2) provoked by exertion and or emotional distress and 3) relieved by rest or nitroglycerin. The patient meets 2 out of three of the criteria, as his substernal chest discomfort is not typical because it radiates to the right arm and right lower abdomen. In addition, given the patient's history that chest pain is relieved by acetaminophen more than nitroglycerin, it is possible this is musculoskeletal pain.  - cyclic troponin values (q8h) and EKG in the AM - Continue ASA, statin, BB, Plavix, and Imdur. - acetaminophen prn - Via cardiology recommendation, consider increasing Imdur 15mg  to 30mg  daily.  - Had been on Ranexa but this was discontinued on 06/14/2015. May need to consider adding it back to his medication regimen following titration of Imdur.  ESRD (Tuesday, Thursday, Saturday): Patient reports he stated dialysis 8 years ago after kidney failure secondary to hypertension. He's had severe cramping and hypotension during dialysis the past two weeks. Patient says he cut dialysis session 10 minutes early today due to severe cramping and chest pain. According to Up To Date: Muscle cramps are a common complication of hemodialysis treatments, occurring in 33 to 86 percent of patients; they often result in the early termination of a hemodialysis session and are therefore a significant cause of underdialysis. The exact etiology of cramps in dialysis patients is unknown.  There is evidence that Vitamin E 400 IU daily and Carnitine 330mg  2-3x per day can reduce cramps. The patient expressed that he was already taking too many medications, so this might not be a great option for him. There is also evidence that reducing hypotension during dialysis (patient was 70/5mmHg during dialysis today) could also prevent cramping. If It is possible, we might request concurrent  infusion of hypertonic saline, mannitol or dextrose during dialysis to prevent hypotension.  - Continue Sensipar and Sevelamer  Chronic Combined Systolic and Diastolic CHF: Patient reports feeling volume overloaded over the past two weeks. Patient has minimal hepatojugular reflux on physical exam, mild bilateral crackles heard at the base of lungs. Patient has no peripheral edema noted. BNP on 9/5 was 672.6.  Recent Echo in 05/2015 showing EF of 45% to 50%. Mild diffuse hypokinesis with no identifiable regional variations and abnormal relaxation with increased filling pressure, consistent with grade 2 diastolic dysfunction. This is unlikely a CHF exacerbations as CXR is clear, there is no signs of increasing orthopnea or PND.  - continue current medication regimen  DMII with neuropathy: Last A1c 6.1%. Patient having severe difficulty walking around house without falling or bumping into things, patient has numerous stubbed toes and missing toe-nails from self-reported falls. We will continue to instill the improtance of monitoring open wounds on his feet due to his neuropathy, diabetes and vascular disease. The patient currently takes pregabalin, amitriptyline and effexor for control. Patient not currently taking any medications to manage blood glucose. - Will continue to monitor -  consider PT/OT consult - continue to monitor open wound on left toenail.    FEN/GI: - Carb modified - Protonix  DVT Ppx: Heparin   Dispo: Disposition is deferred at this time, awaiting improvement of current medical problems. Anticipated discharge in approximately 1 day(s).   The patient does not have a current PCP ([name]) and does need an [facility] hospital follow-up appointment after discharge. Of note, the patient states he had a PCP in [place]. There is no record of the patient being seen by internal medicine at [facility] except for hospitalizations. The patient has missed multiple appointments in the past.     The patient does not have transportation limitations that hinder transportation to clinic appointments.  Signed: Colin Ina, Med Student 07/21/2015, 5:50 PM

## 2015-07-22 DIAGNOSIS — I120 Hypertensive chronic kidney disease with stage 5 chronic kidney disease or end stage renal disease: Secondary | ICD-10-CM | POA: Diagnosis not present

## 2015-07-22 DIAGNOSIS — F329 Major depressive disorder, single episode, unspecified: Secondary | ICD-10-CM

## 2015-07-22 DIAGNOSIS — I2510 Atherosclerotic heart disease of native coronary artery without angina pectoris: Secondary | ICD-10-CM | POA: Diagnosis not present

## 2015-07-22 DIAGNOSIS — Z9861 Coronary angioplasty status: Secondary | ICD-10-CM | POA: Diagnosis not present

## 2015-07-22 DIAGNOSIS — F32A Depression, unspecified: Secondary | ICD-10-CM

## 2015-07-22 DIAGNOSIS — R0789 Other chest pain: Secondary | ICD-10-CM | POA: Diagnosis not present

## 2015-07-22 DIAGNOSIS — N186 End stage renal disease: Secondary | ICD-10-CM | POA: Diagnosis not present

## 2015-07-22 LAB — RENAL FUNCTION PANEL
ALBUMIN: 3.7 g/dL (ref 3.5–5.0)
ANION GAP: 16 — AB (ref 5–15)
BUN: 27 mg/dL — AB (ref 6–20)
CALCIUM: 8.1 mg/dL — AB (ref 8.9–10.3)
CO2: 33 mmol/L — ABNORMAL HIGH (ref 22–32)
CREATININE: 7.97 mg/dL — AB (ref 0.61–1.24)
Chloride: 91 mmol/L — ABNORMAL LOW (ref 101–111)
GFR calc Af Amer: 8 mL/min — ABNORMAL LOW (ref >60)
GFR calc non Af Amer: 7 mL/min — ABNORMAL LOW (ref >60)
GLUCOSE: 115 mg/dL — AB (ref 65–99)
PHOSPHORUS: 6.5 mg/dL — AB (ref 2.5–4.6)
Potassium: 3.9 mmol/L (ref 3.5–5.1)
SODIUM: 140 mmol/L (ref 135–145)

## 2015-07-22 LAB — TROPONIN I: Troponin I: <0.03 ng/mL (ref <0.031)

## 2015-07-22 MED ORDER — ISOSORBIDE MONONITRATE ER 30 MG PO TB24: 30.0000 mg | ORAL_TABLET | Freq: Every day | ORAL | Status: DC

## 2015-07-22 MED ORDER — ISOSORBIDE MONONITRATE ER 30 MG PO TB24
15.0000 mg | ORAL_TABLET | Freq: Once | ORAL | Status: AC
  Administered 2015-07-22: 15 mg via ORAL
  Filled 2015-07-22: qty 1

## 2015-07-22 MED ORDER — VENLAFAXINE HCL ER 75 MG PO CP24: 75.0000 mg | ORAL_CAPSULE | Freq: Every day | ORAL | Status: DC

## 2015-07-22 MED ORDER — ASPIRIN 81 MG PO CHEW: 81.0000 mg | CHEWABLE_TABLET | Freq: Every day | ORAL | Status: DC

## 2015-07-22 NOTE — Progress Notes (Signed)
Subjective: He has been sleeping, but is feeling well.  He denies chest pain this morning.   He has not shown to previous clinic appointments because he is not told when they are.  He uses [facility] or [place] for transportation, and he states they can bring him to clinic.  He needs a f/u appointment on MWF.  He moved to [place] to live with his son, because he was concerned about his ability to take care of himself.  He has been feeling sad/depressed since his friend on dialysis passed away.  He has had an 80 lb weight loss over the last year due to not eating well, but has started to regain weight since living with his son.    Objective: Vital signs in last 24 hours: Filed Vitals:   07/21/15 1700 07/21/15 1833 07/21/15 2151 07/22/15 0442  BP: 132/70 151/81 154/92 132/74  Pulse: 89 92 92 81  Temp:  98 F (36.7 C) 97.9 F (36.6 C) 98.1 F (36.7 C)  TempSrc:  Oral Oral Oral  Resp: 14 17 18 18   Height:  6' (1.829 m)    Weight:  199 lb 4.7 oz (90.4 kg)  200 lb 13.4 oz (91.1 kg)  SpO2: 97% 97% 96% 99%   Weight change:   Intake/Output Summary (Last 24 hours) at 07/22/15 1035 Last data filed at 07/22/15 0906  Gross per 24 hour  Intake    120 ml  Output      0 ml  Net    120 ml   Physical Exam  Constitutional: He is oriented to person, place, and time and well-developed, well-nourished, and in no distress. No distress.  HENT:  Head: Normocephalic and atraumatic.  Eyes: EOM are normal. No scleral icterus.  Neck: No tracheal deviation present.  Cardiovascular: Normal rate, regular rhythm and normal heart sounds.   Pulmonary/Chest: Breath sounds normal. No respiratory distress. He has no wheezes.  Abdominal: Soft. He exhibits no distension. There is no tenderness. There is no rebound and no guarding.  Musculoskeletal: He exhibits no edema.  Neurological: He is alert and oriented to person, place, and time.  Skin: Skin is warm and dry. No rash noted. He is not  diaphoretic.    Lab Results: Basic Metabolic Panel:  Recent Labs Lab 07/21/15 1416 07/21/15 1736 07/22/15 0023  NA 134*  --  140  K 6.3* 4.2 3.9  CL 89*  --  91*  CO2 30  --  33*  GLUCOSE 178*  --  115*  BUN 21*  --  27*  CREATININE 6.66*  --  7.97*  CALCIUM 7.8*  --  8.1*  MG 2.5*  --   --   PHOS  --   --  6.5*   Liver Function Tests:  Recent Labs Lab 07/22/15 0023  ALBUMIN 3.7   No results for input(s): LIPASE, AMYLASE in the last 168 hours. No results for input(s): AMMONIA in the last 168 hours. CBC:  Recent Labs Lab 07/21/15 1330  WBC 5.9  HGB 13.2  HCT 41.3  MCV 82.6  PLT 206   Cardiac Enzymes:  Recent Labs Lab 07/21/15 1915 07/22/15 0022 07/22/15 0632  TROPONINI <0.03 <0.03 <0.03   BNP: No results for input(s): PROBNP in the last 168 hours. D-Dimer: No results for input(s): DDIMER in the last 168 hours. CBG: No results for input(s): GLUCAP in the last 168 hours. Hemoglobin A1C: No results for input(s): HGBA1C in the last 168 hours. Fasting Lipid  Panel: No results for input(s): CHOL, HDL, LDLCALC, TRIG, CHOLHDL, LDLDIRECT in the last 168 hours. Thyroid Function Tests: No results for input(s): TSH, T4TOTAL, FREET4, T3FREE, THYROIDAB in the last 168 hours. Coagulation: No results for input(s): LABPROT, INR in the last 168 hours. Anemia Panel: No results for input(s): VITAMINB12, FOLATE, FERRITIN, TIBC, IRON, RETICCTPCT in the last 168 hours. Urine Drug Screen: Drugs of Abuse     Component Value Date/Time   LABOPIA NONE DETECTED 12/13/2014 0302   COCAINSCRNUR NONE DETECTED 12/13/2014 0302   LABBENZ NONE DETECTED 12/13/2014 0302   AMPHETMU NONE DETECTED 12/13/2014 0302   THCU NONE DETECTED 12/13/2014 0302   LABBARB NONE DETECTED 12/13/2014 0302    Alcohol Level: No results for input(s): ETH in the last 168 hours. Urinalysis: No results for input(s): COLORURINE, LABSPEC, PHURINE, GLUCOSEU, HGBUR, BILIRUBINUR, KETONESUR, PROTEINUR,  UROBILINOGEN, NITRITE, LEUKOCYTESUR in the last 168 hours.  Invalid input(s): APPERANCEUR Misc. Labs:   Micro Results: No results found for this or any previous visit (from the past 240 hour(s)). Studies/Results: Dg Chest 2 View  07/21/2015  CLINICAL DATA:  Mid sternal chest pain and shortness of breath today. EXAM: CHEST  2 VIEW COMPARISON:  July 02, 2015 FINDINGS: The heart size and mediastinal contours are stable. Both lungs are clear. The visualized skeletal structures are unremarkable. IMPRESSION: No active cardiopulmonary disease. Electronically Signed   By: Abelardo Diesel M.D.   On: 07/21/2015 14:05   Medications: I have reviewed the patient's current medications.  We will attempt to decrease the number of medications upon discharge. Scheduled Meds: . amitriptyline  100 mg Oral QHS  . amLODipine  10 mg Oral Daily  . aspirin  81 mg Oral Daily  . atorvastatin  20 mg Oral QHS  . carvedilol  50 mg Oral BID  . cinacalcet  90 mg Oral QHS  . clopidogrel  75 mg Oral Daily  . [START ON 07/26/2015] Darbepoetin Alfa  25 mcg Intravenous Q Tue-HD  . [START ON 07/23/2015] doxercalciferol  2 mcg Intravenous Q T,Th,Sa-HD  . feeding supplement (ENSURE ENLIVE)  237 mL Oral BID BM  . [START ON 07/28/2015] ferric gluconate (FERRLECIT/NULECIT) IV  62.5 mg Intravenous Q Thu-HD  . heparin  5,000 Units Subcutaneous 3 times per day  . isosorbide mononitrate  15 mg Oral Daily  . multivitamin  1 tablet Oral QHS  . pantoprazole  40 mg Oral Daily  . pregabalin  50 mg Oral Daily  . sevelamer carbonate  2,400 mg Oral TID WC  . venlafaxine  37.5 mg Oral BID   Continuous Infusions:  PRN Meds:.acetaminophen, albuterol, nitroGLYCERIN Assessment/Plan: Principal Problem:   Chest pain, atypical Active Problems:   ESRD (end stage renal disease) ([place])   Hypertension   CAD -S/P LAD BMS 2011, LAD DES 2012- patent cors Feb 2016   Diabetes mellitus type 2, insulin dependent (HCC)   GERD (gastroesophageal  reflux disease)  Atypical Chest Pain: Patient presents with sharp pains, sometimes occuring at rest or with exertion. Pain is relieved with rest, but not nitroglycerin. It is more effectively relieved with tylenol or ASA. ECG with T wave inversions in I and aVR, but negative initial troponin. Recent cath demonstrates no significant coronary obstruction. No concern for PE, as patient not dyspneic or tachycardic, satting 98% on RA. As patient does have diabetic neuropathy, it is possible he is having atypical symptoms of true angina. Therefore, we will rule out ACS. However, more likely etiology is MSK > GERD, as pain is  more effectively relieved with tylenol. Patient also has Ipratropium-albuterol inhaler as home med and is complaining of cough, likely adding to MSK pain.  Per cardiology consult, "While his chest pain is difficult to fully determine the true etiology, it could certainly be microvascular ischemia as [opposed to] macrovascular. He [has] had a recent nuclear stress test and recent catheterization, therefore my suspicion is very low that this is actually macrovascular ischemia. More likely microvascular ischemia with secondary diastolic dysfunction leading to his dyspnea on his dialysis days and dialysis mornings." Per pharmacy, Ranexa has not been studied in dialysis patients; therefore, it is not known whether it is either safe or effective.  Imdur has been increased by Cardiology. - Troponins negative - ECG in AM - Nitro PRN - Coreg 50 mg BID - Amlodipine 10 mg daily - Imdur 30 mg TID - Protonix daily - Plavix - ASA  Combined CHF: Hard to discern if patient is volume overloaded on exam. He has slight JVD and minimal crackles on auscultation, but no peripheral edema. He was also experiencing low BPs at dialysis.  - Coreg 50 mg BID - Amlodipine 10 mg daily - Imdur 30 mg TID - Plavix - ASA - Atorvastatin  Depression: Patient says he has been depressed since the death of his  friend who was also on dialysis.  He has had poor appetite and an 80 lb weight loss over the last year.  He has started regaining weight since living with his son.  He is currently taking both Amitriptyline and Venlafaxine.  Will speak with pharmacy regarding his multiple medications, and which one would be most effective for both diabetic neuropathy and depression.  Will also find possible counseling resources for his depression.  DMII with neuropathy: Last A1c 6.1% (Aug 2016). Patient on Amitriptyline, Lyrica, and Effexor. He is currently taking both a TCA and SNRI. No signs or symptoms of seritonin syndrome. Patient not currently taking any medications to manage DM. Will CTM - CTM.  ESRD (TTS): Patient s/p dialysis session today, experiencing bouts of low BP and cramping. Unclear at this time whether his electrolytes are within normal limits.  - Sensipar and Sevelamer  FEN/GI: - Carb modified - Protonix  DVT Ppx: Heparin   Dispo: Disposition is deferred at this time, awaiting improvement of current medical problems.  Anticipated discharge in approximately 1 day(s).   The patient does not have a current PCP ([name]) and does need an [facility] hospital follow-up appointment after discharge.  The patient does have transportation limitations that hinder transportation to clinic appointments.  .Services Needed at time of discharge: Y = Yes, Blank = No PT:   OT:   RN:   Equipment:   Other:       Iline Oven, MD 07/22/2015, 10:35 AM

## 2015-07-22 NOTE — Discharge Summary (Signed)
Name: [name] [name] MRN: [id number] DOB: 09/24/1956 59 y.o. PCP: [name]  Date of Admission: 07/21/2015  1:14 PM Date of Discharge: 07/22/2015 Attending Physician: Michel Bickers, MD  Discharge Diagnosis: 1. Atypical Chest Pain  Principal Problem:   Chest pain, atypical Active Problems:   ESRD (end stage renal disease) ([place])   Hypertension   CAD -S/P LAD BMS 2011, LAD DES 2012- patent cors Feb 2016   Diabetes mellitus type 2, insulin dependent (HCC)   GERD (gastroesophageal reflux disease)   Depression  Discharge Medications:   Medication List    STOP taking these medications        aspirin 325 MG tablet  Replaced by:  aspirin 81 MG chewable tablet     finasteride 5 MG tablet  Commonly known as:  PROSCAR     venlafaxine 37.5 MG tablet  Commonly known as:  EFFEXOR  Replaced by:  venlafaxine XR 75 MG 24 hr capsule      TAKE these medications        acetaminophen 500 MG tablet  Commonly known as:  TYLENOL  Take 500 mg by mouth every 6 (six) hours as needed for headache.     amitriptyline 100 MG tablet  Commonly known as:  ELAVIL  Take 1 tablet (100 mg total) by mouth at bedtime.     amLODipine 10 MG tablet  Commonly known as:  NORVASC  Take 10 mg by mouth daily.     aspirin 81 MG chewable tablet  Chew 1 tablet (81 mg total) by mouth daily.     atorvastatin 20 MG tablet  Commonly known as:  LIPITOR  Take 20 mg by mouth at bedtime.     carvedilol 25 MG tablet  Commonly known as:  COREG  Take 50 mg by mouth 2 (two) times daily.     cinacalcet 90 MG tablet  Commonly known as:  SENSIPAR  Take 90 mg by mouth at bedtime.     clopidogrel 75 MG tablet  Commonly known as:  PLAVIX  Take 1 tablet (75 mg total) by mouth daily.     COMBIVENT RESPIMAT 20-100 MCG/ACT Aers respimat  Generic drug:  Ipratropium-Albuterol  Inhale 2 puffs into the lungs every 6 (six) hours as needed for wheezing.     Darbepoetin Alfa 25 MCG/0.42ML Sosy injection    Commonly known as:  ARANESP  Inject 0.42 mLs (25 mcg total) into the vein every Tuesday with hemodialysis.     doxercalciferol 4 MCG/2ML injection  Commonly known as:  HECTOROL  Inject 1 mL (2 mcg total) into the vein Every Tuesday,Thursday,and Saturday with dialysis.     ferric gluconate 62.5 mg in sodium chloride 0.9 % 100 mL  Inject 62.5 mg into the vein every Thursday with hemodialysis.     isosorbide mononitrate 30 MG 24 hr tablet  Commonly known as:  IMDUR  Take 1 tablet (30 mg total) by mouth daily.  Start taking on:  07/23/2015     lidocaine 5 %  Commonly known as:  LIDODERM  Place 1 patch onto the skin daily. Remove & Discard patch within 12 hours or as directed by MD     meclizine 25 MG tablet  Commonly known as:  ANTIVERT  Take 25 mg by mouth 3 (three) times daily as needed for dizziness.     multivitamin Tabs tablet  Take 1 tablet by mouth at bedtime.     nitroGLYCERIN 0.4 MG SL tablet  Commonly known as:  NITROSTAT  Place 1 tablet (0.4 mg total) under the tongue every 5 (five) minutes as needed for chest pain.     pantoprazole 40 MG tablet  Commonly known as:  PROTONIX  Take 1 tablet (40 mg total) by mouth daily.     pregabalin 50 MG capsule  Commonly known as:  LYRICA  Take 1 capsule (50 mg total) by mouth daily.     sevelamer carbonate 800 MG tablet  Commonly known as:  RENVELA  Take 2,400 mg by mouth 3 (three) times daily with meals.     venlafaxine XR 75 MG 24 hr capsule  Commonly known as:  EFFEXOR XR  Take 1 capsule (75 mg total) by mouth daily with breakfast.        Disposition and follow-up:   [name] [name] was discharged from [facility] in Good condition.  At the hospital follow up visit please address:  1.    Depression symptoms and control  Medication reconciliation for which medications patient is taking and which medications can be stopped  2.  Labs / imaging needed at time of follow-up: none  3.  Pending  labs/ test needing follow-up: none  Follow-up Appointments:     Follow-up Information    Follow up with Shela Leff, MD On 07/27/2015.   Specialty:  Internal Medicine   Why:  3:45 pm   Contact information:   [place] [phone]       Discharge Instructions: Discharge Instructions    Call MD for:  persistant dizziness or light-headedness    Complete by:  As directed      Call MD for:    Complete by:  As directed   Chest pain unrelieved with nitroglycerin or tylenol.     Diet - low sodium heart healthy    Complete by:  As directed      Increase activity slowly    Complete by:  As directed            Consultations: Treatment Team:  R[facility], MD Corliss Parish, MD  Procedures Performed:  Dg Chest 2 View  07/21/2015  CLINICAL DATA:  Mid sternal chest pain and shortness of breath today. EXAM: CHEST  2 VIEW COMPARISON:  July 02, 2015 FINDINGS: The heart size and mediastinal contours are stable. Both lungs are clear. The visualized skeletal structures are unremarkable. IMPRESSION: No active cardiopulmonary disease. Electronically Signed   By: Abelardo Diesel M.D.   On: 07/21/2015 14:05   Dg Chest 2 View  07/02/2015  CLINICAL DATA:  Central chest pain, shortness of breath, dizziness for 4 days. EXAM: CHEST  2 VIEW COMPARISON:  06/13/2015 FINDINGS: There is cardiomegaly. No focal airspace opacities or edema. No acute bony abnormality. No effusions. IMPRESSION: Cardiomegaly.  No acute findings. Electronically Signed   By: Rolm Baptise M.D.   On: 07/02/2015 12:54    2D Echo: none  Cardiac Cath: none  Admission HPI: [name] [name] is a 59 yo male with DMII, HTN, ESRD (TTS), combined CHF (EF 45-50% and grade 2 diastolic dysfunction; [id number]) and h/o CAD s/p BMS (2011) and DES (2012). He presents from dialysis today with multiple nonspecific complaints. He has been feeling fluid overloaded over the last few weeks, but is unable to  describe why he feels this way. He went to dialysis and hoped to have more fluid taken off. However, his BP kept dropping during the session. He was also experiencing cramping pains of his hands,  arms, and legs. He denies orthopnea and only sleeps on one pillow. He is about 1 pound from his normal weight. He presented to the ED because he still felt he had extra volume and wanted more volume taken off.  He also reports chest pain occurring during dialysis, along with the cramping. He states the chest pain has been occurring for about a year. It is sharp or "tight," sternal pain, radiating to his right and abdomen. It is minimally relieved with nitroglycerin, but effectively treated with tylenol or ASA. The pain is nonpleuritic, non-positional, and not reproduced with palpation. The pains will occur at rest or with exertion. He is able to walk half a mile before experiencing chest pain or SOB. He believes his chest pain is similar to the pain in 2011 and 2012 that led to his two stents. However, he had a cardiac cath in February 2016 that showed no significant coronary obstructive disease with widely patent LAD stent.   He has a history of GERD, for which he takes Protonix PRN. He endorses cough at night after eating a large meal and lying down. However, he does not experience chest pain when coughing during these episodes.  He has a pan-positive review of systems.  In the ED, his initial troponin was negative. His potassium was 6.3, but the sample was hemolyzed. ECG showed new T wave inversions in I and aVR without peaked T-waves. He was given Kayexalate and a 500 cc NS bolus.  Hospital Course by problem list: Principal Problem:   Chest pain, atypical Active Problems:   ESRD (end stage renal disease) ([place])   Hypertension   CAD -S/P LAD BMS 2011, LAD DES 2012- patent cors Feb 2016   Diabetes mellitus type 2, insulin dependent (HCC)   GERD (gastroesophageal reflux disease)    Depression   Atypical Chest Pain: Patient was monitored with serial ECG and troponins, which were negative overnight.  Given his recent normal nuclear stress test and catheterization, it was not felt to be macrovascular in etiology.  It was more likely MSK in nature, given it's year's duration without change and relief with tylenol or ASA.  His Imdur was increased to 30 mg daily for added control of patient's angina.  Ranexa was also recommended by Cardiology, but it has not been studied in dialysis patients and was not initiated on discharge.   DMII with neuropathy: Patient is not on any glycemic control medications, with borderline normal glucose in the hospital.  He was taking Amitriptyline, Venlafaxine, and Lyrica for diabetic neuropathy.  His Venlafaxine was changed to Venlafaxine XR 75 mg once daily for added benefit, as well as for relief of new depression.    Depression: Patient admits to 80 lb weight loss over last year and decreased appetite following the death of his friend who was also on dialysis.  He admits to feeling depressed.  He was provided resources for counseling/therapy in [facility] by Social Work.  Discharge Vitals:   BP 107/61 mmHg  Pulse 73  Temp(Src) 98 F (36.7 C) (Oral)  Resp 18  Ht 6' (1.829 m)  Wt 200 lb 13.4 oz (91.1 kg)  BMI 27.23 kg/m2  SpO2 95%  Discharge Labs:  Results for orders placed or performed during the hospital encounter of 07/21/15 (from the past 24 hour(s))  Potassium     Status: None   Collection Time: 07/21/15  5:36 PM  Result Value Ref Range   Potassium 4.2 3.5 - 5.1 mmol/L  Troponin I (  q 6hr x 3)     Status: None   Collection Time: 07/21/15  7:15 PM  Result Value Ref Range   Troponin I <0.03 <0.031 ng/mL  Troponin I (q 6hr x 3)     Status: None   Collection Time: 07/22/15 12:22 AM  Result Value Ref Range   Troponin I <0.03 <0.031 ng/mL  Renal function panel     Status: Abnormal   Collection Time: 07/22/15 12:23 AM  Result Value  Ref Range   Sodium 140 135 - 145 mmol/L   Potassium 3.9 3.5 - 5.1 mmol/L   Chloride 91 (L) 101 - 111 mmol/L   CO2 33 (H) 22 - 32 mmol/L   Glucose, Bld 115 (H) 65 - 99 mg/dL   BUN 27 (H) 6 - 20 mg/dL   Creatinine, Ser 7.97 (H) 0.61 - 1.24 mg/dL   Calcium 8.1 (L) 8.9 - 10.3 mg/dL   Phosphorus 6.5 (H) 2.5 - 4.6 mg/dL   Albumin 3.7 3.5 - 5.0 g/dL   GFR calc non Af Amer 7 (L) >60 mL/min   GFR calc Af Amer 8 (L) >60 mL/min   Anion gap 16 (H) 5 - 15  Troponin I (q 6hr x 3)     Status: None   Collection Time: 07/22/15  6:32 AM  Result Value Ref Range   Troponin I <0.03 <0.031 ng/mL    Signed: Iline Oven, MD 07/22/2015, 2:33 PM    Services Ordered on Discharge: none Equipment Ordered on Discharge: none

## 2015-07-22 NOTE — Care Management Note (Addendum)
Case Management Note  Patient Details  Name: Telvin Minzey MRN: [id number] Date of Birth: January 15, 1956  Subjective/Objective:    Pt was admitted with chest pain                Action/Plan:  Pt is independent from home with adult son.     Expected Discharge Date:                  Expected Discharge Plan:  Home/Self Care  In-House Referral:     Discharge planning Services     Post Acute Care Choice:    Choice offered to:     DME Arranged:    DME Agency:     HH Arranged:    [place] Agency:     Status of Service:  Completed, signed off  Medicare Important Message Given:    Date Medicare IM Given:    Medicare IM give by:    Date Additional Medicare IM Given:    Additional Medicare Important Message give by:     If discussed at [place] of Stay Meetings, dates discussed:    Additional Comments: CM assessed pt, pt is requesting transportation home for hospital, bedside nurse will contact CSW for voucher. Maryclare Labrador, RN 07/22/2015, 3:10 PM

## 2015-07-22 NOTE — Progress Notes (Signed)
Pt given discharge instructions, medication lists, follow up appointments, and when to call the doctor.  Pt verbalizes understanding. Patient was given copy of discharge paperwork from MD.  I confirmed that he understood discontinued and new medications.  All questions answered.  Patient states he does not have transportation home.  Patient has requested assistance with discharge as he can not ride the bus because of his "bad toe". CSW involved. Pt resting with call bell within reach.  Will continue to monitor. Payton Emerald, RN

## 2015-07-22 NOTE — Progress Notes (Signed)
Patient Name: [name] [name] Date of Encounter: 07/22/2015   SUBJECTIVE  Feeling well. No chest pain, sob or palpitations. Still having some R hand cramp.   CURRENT MEDS . amitriptyline  100 mg Oral QHS  . amLODipine  10 mg Oral Daily  . aspirin  81 mg Oral Daily  . atorvastatin  20 mg Oral QHS  . carvedilol  50 mg Oral BID  . cinacalcet  90 mg Oral QHS  . clopidogrel  75 mg Oral Daily  . [START ON 07/26/2015] Darbepoetin Alfa  25 mcg Intravenous Q Tue-HD  . [START ON 07/23/2015] doxercalciferol  2 mcg Intravenous Q T,Th,Sa-HD  . feeding supplement (ENSURE ENLIVE)  237 mL Oral BID BM  . [START ON 07/28/2015] ferric gluconate (FERRLECIT/NULECIT) IV  62.5 mg Intravenous Q Thu-HD  . heparin  5,000 Units Subcutaneous 3 times per day  . isosorbide mononitrate  15 mg Oral Daily  . multivitamin  1 tablet Oral QHS  . pantoprazole  40 mg Oral Daily  . pregabalin  50 mg Oral Daily  . sevelamer carbonate  2,400 mg Oral TID WC  . venlafaxine  37.5 mg Oral BID    OBJECTIVE  Filed Vitals:   07/21/15 1700 07/21/15 1833 07/21/15 2151 07/22/15 0442  BP: 132/70 151/81 154/92 132/74  Pulse: 89 92 92 81  Temp:  98 F (36.7 C) 97.9 F (36.6 C) 98.1 F (36.7 C)  TempSrc:  Oral Oral Oral  Resp: 14 17 18 18   Height:  6' (1.829 m)    Weight:  199 lb 4.7 oz (90.4 kg)  200 lb 13.4 oz (91.1 kg)  SpO2: 97% 97% 96% 99%    Intake/Output Summary (Last 24 hours) at 07/22/15 1032 Last data filed at 07/22/15 0906  Gross per 24 hour  Intake    120 ml  Output      0 ml  Net    120 ml   Filed Weights   07/21/15 1833 07/22/15 0442  Weight: 199 lb 4.7 oz (90.4 kg) 200 lb 13.4 oz (91.1 kg)    PHYSICAL EXAM  General: Pleasant, NAD. Neuro: Alert and oriented X 3. Moves all extremities spontaneously. Psych: Normal affect. HEENT:  Normal  Neck: Supple without bruits or JVD. Lungs:  Resp regular and unlabored, CTA. Heart: RRR no s3, s4, or murmurs. Abdomen: Soft, non-tender, non-distended, BS  + x 4.  Extremities: No clubbing, cyanosis or edema. DP/PT/Radials 2+ and equal bilaterally. AV Fistula in place on left.  Accessory Clinical Findings  CBC  Recent Labs  07/21/15 1330  WBC 5.9  HGB 13.2  HCT 41.3  MCV 82.6  PLT 99991111   Basic Metabolic Panel  Recent Labs  07/21/15 1416 07/21/15 1736 07/22/15 0023  NA 134*  --  140  K 6.3* 4.2 3.9  CL 89*  --  91*  CO2 30  --  33*  GLUCOSE 178*  --  115*  BUN 21*  --  27*  CREATININE 6.66*  --  7.97*  CALCIUM 7.8*  --  8.1*  MG 2.5*  --   --   PHOS  --   --  6.5*   Liver Function Tests  Recent Labs  07/22/15 0023  ALBUMIN 3.7   No results for input(s): LIPASE, AMYLASE in the last 72 hours. Cardiac Enzymes  Recent Labs  07/21/15 1915 07/22/15 0022 07/22/15 0632  TROPONINI <0.03 <0.03 <0.03    TELE  NSR  Radiology/Studies  Dg Chest 2 View  07/21/2015  CLINICAL DATA:  Mid sternal chest pain and shortness of breath today. EXAM: CHEST  2 VIEW COMPARISON:  July 02, 2015 FINDINGS: The heart size and mediastinal contours are stable. Both lungs are clear. The visualized skeletal structures are unremarkable. IMPRESSION: No active cardiopulmonary disease. Electronically Signed   By: Abelardo Diesel M.D.   On: 07/21/2015 14:05   Dg Chest 2 View  07/02/2015  CLINICAL DATA:  Central chest pain, shortness of breath, dizziness for 4 days. EXAM: CHEST  2 VIEW COMPARISON:  06/13/2015 FINDINGS: There is cardiomegaly. No focal airspace opacities or edema. No acute bony abnormality. No effusions. IMPRESSION: Cardiomegaly.  No acute findings. Electronically Signed   By: Rolm Baptise M.D.   On: 07/02/2015 12:54    ASSESSMENT AND PLAN Principal Problem:   Chest pain, atypical Active Problems:   ESRD (end stage renal disease) ([place])   Hypertension   CAD -S/P LAD BMS 2011, LAD DES 2012- patent cors Feb 2016   Diabetes mellitus type 2, insulin dependent (HCC)   GERD (gastroesophageal reflux disease)    Plan: had mild  CP during dialysis. did not complete his dialysis. No further CP. More noticeable of R hand cramp. He is on T,Th and Saturday schedule for HD. He is ruled out for MI. Trop x 3 negative. See consult note for detailed instruction.   Signed, Bhagat,Bhavinkumar PA-C   No more notable CP - unlikely cardiac in nature.  No new recommendations. Patient being d/c'd upon my arrival.  Ellyn Hack, Leonie Green, MD

## 2015-07-22 NOTE — Progress Notes (Signed)
Transported patient to front entrance for pick up by [name] taxi. Payton Emerald, RN

## 2015-07-22 NOTE — Discharge Instructions (Signed)
1. We have increased your dose of Imdur to 30 mg daily. 2. We have increased your dose of Venlafaxine to 75 mg daily.  Just take this medication once per day. 3. Follow up with the [facility] clinic at [facility] on 10/19 at 3:45 pm.   Chest Wall Pain Chest wall pain is pain in or around the bones and muscles of your chest. Sometimes, an injury causes this pain. Sometimes, the cause may not be known. This pain may take several weeks or longer to get better. HOME CARE INSTRUCTIONS  Pay attention to any changes in your symptoms. Take these actions to help with your pain:   Rest as told by your health care provider.   Avoid activities that cause pain. These include any activities that use your chest muscles or your abdominal and side muscles to lift heavy items.   If directed, apply ice to the painful area:  Put ice in a plastic bag.  Place a towel between your skin and the bag.  Leave the ice on for 20 minutes, 2-3 times per day.  Take over-the-counter and prescription medicines only as told by your health care provider.  Do not use tobacco products, including cigarettes, chewing tobacco, and e-cigarettes. If you need help quitting, ask your health care provider.  Keep all follow-up visits as told by your health care provider. This is important. SEEK MEDICAL CARE IF:  You have a fever.  Your chest pain becomes worse.  You have new symptoms. SEEK IMMEDIATE MEDICAL CARE IF:  You have nausea or vomiting.  You feel sweaty or light-headed.  You have a cough with phlegm (sputum) or you cough up blood.  You develop shortness of breath.   This information is not intended to replace advice given to you by your health care provider. Make sure you discuss any questions you have with your health care provider.   Document Released: 09/24/2005 Document Revised: 06/15/2015 Document Reviewed: 12/20/2014 Elsevier Interactive Patient Education [employer].

## 2015-07-22 NOTE — Progress Notes (Signed)
July 22, 2015 Pharmacy  Pharmacy Students rounding with [facility]. Evaluating patient's profile for potential re-initiation of Ranexa (ranolazine).   After completing a literature search, I was unable to find ranolazine studied in patients on hemodialysis. However, I was able to find a pharmacokinetic study containing both healthy subjects (n=8) and subjects ranging from mild to severe renal impairment (n=21). Ranolazine's AUC0-12 and Cmax at steady state were found to be 1.7 to 2 fold higher in patients with renal impairment verses in healthy subjects. The apparent half-life of ranolazine was found to not be prolonged due to worsening renal function (the extended release formulation has an absorption half-life that is longer than the elimination half-life).  Octavio Manns Melynda Keller H. Effect of renal impairment on multiple-dose pharmacokinetics of extended-release ranolazine. Clinical Pharmacology & Therapeutics. 2005;78(3):288-297. doi:10.1016/j.clpt.2005.05.004.]  Additionally, according to UpToDate's dosing in renal impairment, "There are no dosage adjustments provided in the manufacturer's labeling. However, plasma ranolazine levels increased ~40% to 50% in patients with varying degrees of renal dysfunction. Discontinue if acute renal failure develops during treatment. Ranolazine has not been evaluated in patients requiring dialysis, although it is unlikely to be removed by hemodialysis due to plasma protein binding."   At this time, no recommendation can be made concerning re-initiating this medication since there is no data studying ranolazine in patient's requiring hemodialysis.  Aura Fey. March Rummage, PharmD Candidate and Trinna Balloon. Arminger, PharmD Candidate

## 2015-07-22 NOTE — Progress Notes (Signed)
Initial Nutrition Assessment  DOCUMENTATION CODES:   Not applicable  INTERVENTION:   Nepro Shake po BID, each supplement provides 425 kcal and 19 grams protein  NUTRITION DIAGNOSIS:   Increased nutrient needs related to chronic illness as evidenced by estimated needs   GOAL:   Patient will meet greater than or equal to 90% of their needs  MONITOR:   PO intake, Supplement acceptance, Labs, Weight trends, I & O's  REASON FOR ASSESSMENT:   Malnutrition Screening Tool  ASSESSMENT:   59 yo Male with DMI, HTN, ESRD, combined CHF and CAD.  He presents from dialysis today with multiple nonspecific complaints. He has been feeling fluid overloaded over the last few weeks, but is unable to describe why he feels this way. He went to dialysis and hoped to have more fluid taken off. However, his BP kept dropping during the session. He was also experiencing cramping pains of his hands, arms, and legs. He denies orthopnea and only sleeps on one pillow. He is about 1 pound from his normal weight. He presented to the ED because he still felt he had extra volume and wanted more volume taken off.  Patient reports his appetite has been poor.  He is unable to identify for how long.  Typically consumes 1 meal per day.  PO intake variable at 25-50% per flowsheet records.  Pt also reports he's lost 80 lbs in the last year (severe for time frame).  Per H&P, pt has been depressed about the death of his friend that was also on HD.   Ensure Enlive oral nutrition supplements in place.  RD unable to complete Nutrition Focused Physical Exam at this time.  Diet Order:  Diet heart healthy/carb modified Room service appropriate?: Yes; Fluid consistency:: Thin  Skin:  Reviewed, no issues  Last BM:  10/12  Height:   Ht Readings from Last 1 Encounters:  07/21/15 6' (1.829 m)    Weight:   Wt Readings from Last 1 Encounters:  07/22/15 200 lb 13.4 oz (91.1 kg)    Ideal Body Weight:  81 kg  BMI:   Body mass index is 27.23 kg/(m^2).  Estimated Nutritional Needs:   Kcal:  2200-2400  Protein:  120-130 gm  Fluid:  1200 ml  EDUCATION NEEDS:   No education needs identified at this time  Arthur Holms, RD, LDN Pager #: [phone] After-Hours Pager #: [phone]

## 2015-07-27 ENCOUNTER — Ambulatory Visit (INDEPENDENT_AMBULATORY_CARE_PROVIDER_SITE_OTHER): Payer: Medicare Other | Admitting: Internal Medicine

## 2015-07-27 ENCOUNTER — Encounter: Payer: Self-pay | Admitting: Internal Medicine

## 2015-07-27 VITALS — BP 141/82 | HR 87 | Temp 98.1°F | Ht 72.0 in | Wt 211.0 lb

## 2015-07-27 DIAGNOSIS — Z794 Long term (current) use of insulin: Secondary | ICD-10-CM | POA: Diagnosis not present

## 2015-07-27 DIAGNOSIS — F329 Major depressive disorder, single episode, unspecified: Secondary | ICD-10-CM

## 2015-07-27 DIAGNOSIS — R0789 Other chest pain: Secondary | ICD-10-CM

## 2015-07-27 DIAGNOSIS — E119 Type 2 diabetes mellitus without complications: Secondary | ICD-10-CM

## 2015-07-27 DIAGNOSIS — F32A Depression, unspecified: Secondary | ICD-10-CM

## 2015-07-27 DIAGNOSIS — Z09 Encounter for follow-up examination after completed treatment for conditions other than malignant neoplasm: Secondary | ICD-10-CM | POA: Diagnosis not present

## 2015-07-27 DIAGNOSIS — Z87891 Personal history of nicotine dependence: Secondary | ICD-10-CM

## 2015-07-27 DIAGNOSIS — E1140 Type 2 diabetes mellitus with diabetic neuropathy, unspecified: Secondary | ICD-10-CM | POA: Diagnosis not present

## 2015-07-27 DIAGNOSIS — Z7982 Long term (current) use of aspirin: Secondary | ICD-10-CM

## 2015-07-27 DIAGNOSIS — R634 Abnormal weight loss: Secondary | ICD-10-CM

## 2015-07-27 DIAGNOSIS — E1142 Type 2 diabetes mellitus with diabetic polyneuropathy: Secondary | ICD-10-CM

## 2015-07-27 MED ORDER — VENLAFAXINE HCL ER 75 MG PO CP24: 75.0000 mg | ORAL_CAPSULE | Freq: Every day | ORAL | Status: DC

## 2015-07-27 MED ORDER — ISOSORBIDE MONONITRATE ER 30 MG PO TB24: 30.0000 mg | ORAL_TABLET | Freq: Every day | ORAL | Status: DC

## 2015-07-27 MED ORDER — AMITRIPTYLINE HCL 100 MG PO TABS: 100.0000 mg | ORAL_TABLET | Freq: Every day | ORAL | Status: DC

## 2015-07-27 NOTE — Patient Instructions (Signed)
[name] [name] it was nice meeting you today.  -I have referred you to gastroenterology today. Our office will call you with an appointment date.  -In addition, I have referred you for eye and foot exams. Our office will call you with appointment dates.  -Please return for a follow-up visit in 3-4 weeks.

## 2015-07-29 DIAGNOSIS — E1142 Type 2 diabetes mellitus with diabetic polyneuropathy: Secondary | ICD-10-CM | POA: Insufficient documentation

## 2015-07-29 DIAGNOSIS — E1140 Type 2 diabetes mellitus with diabetic neuropathy, unspecified: Secondary | ICD-10-CM | POA: Insufficient documentation

## 2015-07-29 NOTE — Assessment & Plan Note (Addendum)
Patient was recently admitted for atypical chest pain which was deemed musculoskeletal in nature. Cardiac workup was negative at the time. At present, he denies having any chest pain, shortness of breath, dizziness, palpitations, or diaphoresis. -Imdur 30 mg daily refilled

## 2015-07-29 NOTE — Assessment & Plan Note (Addendum)
Discuss at next visit. Last A1c was 6.1 in August 2016. -Referral to ophthalmology -Referral to podiatry -A1c next month

## 2015-07-29 NOTE — Progress Notes (Signed)
Patient ID: [name] [name], male   DOB: November 16, 1955, 59 y.o.   MRN: [id number]   Subjective:   Patient ID: [name] [name] male   DOB: [birth year] 59 y.o.   MRN: [id number]  HPI: [name] is a 59 y.o. male with a past medical history of conditions listed below presenting to the clinic for hospital follow-up. Patient endorses losing 80 pounds of weight over the past 1 year. States he has been feeling depressed and has a decreased appetite since his friend passed away 3 months ago. Denies any suicidal or homicidal ideation. Patient was provided with resources for counseling/therapy in [facility] by social work during his recent hospital stay. Reports having several episodes of hematochezia about 4 months ago. In addition, reports having 2 episodes of melena in the past 3 days. Denies having any symptoms of GERD. Denies hematemesis.    Past Medical History  Diagnosis Date  . Hypertension   . Hematochezia     a. 2014: colonscopy, which showed moderately-sized internal hemorrhoids, two 26mm polyps in transverse colon and ascending colon that were resected, five 2-40mm polyps in sigmoid colon, descending colon, transverse colon, and ascending colon that were resected. An upper endoscopy was performed and showed normal esophagus, stomach, and duodenum.  . Hematuria     a. H/o hematuria 2014 with cystoscopy that was unrevealing for his source of hematuria. He underwent a kidney ultrasound on 10/14 that showed mildly echogenic and scarred kidneys compatible with medical renal disease, without hydronephrosis or renal calculi.  [name] Anemia   . CAD (coronary artery disease)     a. per [facility] s/p 3.18mm x 83mm Vision BMS to mid LAD 12/2009 and Xience DES to mid LAD 10/2010.  . Colon polyps   . Chronic diastolic CHF (congestive heart failure) ([place])   . Hyperlipidemia   . Anginal pain ([place])   . Heart murmur   . 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 Type II diabetes mellitus  ([place])   . History of blood transfusion     "had colonoscopy done; they had to give me some blood"  . Daily headache   . ESRD on dialysis [facility]) since ~ 2008    "[place]; TTS" (07/21/2015)  . On home oxygen therapy     "2L prn" (07/21/2015)   Current Outpatient Prescriptions  Medication Sig Dispense Refill  . acetaminophen (TYLENOL) 500 MG tablet Take 500 mg by mouth every 6 (six) hours as needed for headache.     [name] amitriptyline (ELAVIL) 100 MG tablet Take 1 tablet (100 mg total) by mouth at bedtime. 30 tablet 2  . amLODipine (NORVASC) 10 MG tablet Take 10 mg by mouth daily.    [name] aspirin 81 MG chewable tablet Chew 1 tablet (81 mg total) by mouth daily.    [name] atorvastatin (LIPITOR) 20 MG tablet Take 20 mg by mouth at bedtime.     . carvedilol (COREG) 25 MG tablet Take 50 mg by mouth 2 (two) times daily.     . cinacalcet (SENSIPAR) 90 MG tablet Take 90 mg by mouth at bedtime.     . clopidogrel (PLAVIX) 75 MG tablet Take 1 tablet (75 mg total) by mouth daily. 30 tablet 11  . Darbepoetin Alfa (ARANESP) 25 MCG/0.42ML SOSY injection Inject 0.42 mLs (25 mcg total) into the vein every Tuesday with hemodialysis. 14.28 mL   . doxercalciferol (HECTOROL) 4 MCG/2ML injection Inject 1 mL (2 mcg total) into  the vein Every Tuesday,Thursday,and Saturday with dialysis. 2 mL   . ferric gluconate 62.5 mg in sodium chloride 0.9 % 100 mL Inject 62.5 mg into the vein every Thursday with hemodialysis.    . Ipratropium-Albuterol (COMBIVENT RESPIMAT) 20-100 MCG/ACT AERS respimat Inhale 2 puffs into the lungs every 6 (six) hours as needed for wheezing.    . isosorbide mononitrate (IMDUR) 30 MG 24 hr tablet Take 1 tablet (30 mg total) by mouth daily. 30 tablet 2  . lidocaine (LIDODERM) 5 % Place 1 patch onto the skin daily. Remove & Discard patch within 12 hours or as directed by MD (Patient not taking: Reported on 07/21/2015) 30 patch 0  . meclizine (ANTIVERT) 25 MG tablet Take 25 mg by mouth 3 (three) times daily as  needed for dizziness.    . multivitamin (RENA-VIT) TABS tablet Take 1 tablet by mouth at bedtime. 30 tablet 0  . nitroGLYCERIN (NITROSTAT) 0.4 MG SL tablet Place 1 tablet (0.4 mg total) under the tongue every 5 (five) minutes as needed for chest pain. 30 tablet 0  . pantoprazole (PROTONIX) 40 MG tablet Take 1 tablet (40 mg total) by mouth daily. 30 tablet 0  . pregabalin (LYRICA) 50 MG capsule Take 1 capsule (50 mg total) by mouth daily. 30 capsule 0  . sevelamer carbonate (RENVELA) 800 MG tablet Take 2,400 mg by mouth 3 (three) times daily with meals.     . venlafaxine XR (EFFEXOR XR) 75 MG 24 hr capsule Take 1 capsule (75 mg total) by mouth daily with breakfast. 30 capsule 2   No current facility-administered medications for this visit.   Family History  Problem Relation Age of Onset  . Hypertension    . Bone cancer Mother   . Anuerysm Father   . Diabetes type II Daughter    Social History   Social History  . Marital Status: Divorced    Spouse Name: N/A  . Number of Children: N/A  . Years of Education: N/A   Social History Main Topics  . Smoking status: Former Smoker -- 0.50 packs/day for 8 years    Types: Cigarettes    Quit date: 12/06/2010  . Smokeless tobacco: Never Used  . Alcohol Use: No  . Drug Use: No  . Sexual Activity: Not Currently   Other Topics Concern  . None   Social History Narrative   Moved from [place], [place] to [place] in 11/2014.   Review of Systems: Review of Systems  Constitutional: Positive for weight loss and malaise/fatigue. Negative for fever and chills.  HENT: Negative for ear pain.   Eyes: Negative for blurred vision and pain.  Respiratory: Negative for cough, shortness of breath and wheezing.   Cardiovascular: Negative for chest pain, palpitations and leg swelling.  Gastrointestinal: Positive for blood in stool and melena. Negative for nausea, vomiting, abdominal pain, diarrhea and constipation.  Musculoskeletal: Negative for  myalgias.  Skin: Negative for itching and rash.  Neurological: Negative for dizziness, sensory change, focal weakness and headaches.  Psychiatric/Behavioral: Positive for depression. Negative for suicidal ideas.   Objective:  Physical Exam: Filed Vitals:   07/27/15 1632  BP: 141/82  Pulse: 87  Temp: 98.1 F (36.7 C)  TempSrc: Oral  Height: 6' (1.829 m)  Weight: 211 lb (95.709 kg)  SpO2: 100%   Physical Exam  Constitutional: He is oriented to person, place, and time. He appears well-developed and well-nourished. No distress.  HENT:  Head: Normocephalic and atraumatic.  Eyes: EOM are normal. Pupils are  equal, round, and reactive to light.  Neck: Neck supple. No tracheal deviation present.  Cardiovascular: Normal rate, regular rhythm and intact distal pulses.   Pulmonary/Chest: Effort normal. No respiratory distress. He has no wheezes. He has no rales.  Abdominal: Soft. Bowel sounds are normal. He exhibits no distension. There is no tenderness. There is no rebound and no guarding.  Musculoskeletal: He exhibits no edema or tenderness.  Left toe wound bleeding  Neurological: He is alert and oriented to person, place, and time.  Skin: Skin is warm and dry.   Assessment & Plan:

## 2015-07-29 NOTE — Assessment & Plan Note (Signed)
Patient is complaining of numbness and tingling in his feet. A1c in August 2016 was 6.1. B12 normal. He is currently on amitriptyline, venlafaxine, and Lyrica for diabetic neuropathy. During his hospital stay venlafaxine was changed to venlafaxine XR 75 mg once daily for the added benefit of depression relief.  -Venlafaxine XR 75 mg daily refilled -Amitriptyline refilled

## 2015-07-29 NOTE — Assessment & Plan Note (Signed)
Patient endorses 80 pound weight loss over the past 1 year. States he has a decreased appetite since his friend passed away 3 months ago. Reports having several episodes of hematochezia 4 months ago and 2 episodes of melanotic stools in the past 3 days. Denies any symptoms of GERD. Denies any hematemesis. In October 2014, EGD normal and colonoscopy showing polyps and internal hemorrhoids. Our records indicate patient has lost 31 pounds since July 2015. His weight loss and episodes of hematochezia plus melena are concerning for an underlying GI malignancy. Although patient does report feeling depressed and having a reduced appetite since his friend passed away, bereavement would not likely account for a 31 pound weight loss in 3 months.  -GI referral

## 2015-07-29 NOTE — Assessment & Plan Note (Addendum)
Patient reports feeling depressed and having a low energy level since his friend passed away 3 months ago. Denies any suicidal or homicidal ideation. PHQ-9 score 8 at this visit. Patient is currently taking amitriptyline and venlafaxine. During his hospital stay he was provided with resources for counseling/therapy in [facility] by social work. -Venlafaxine XR 75 mg daily refilled  -Continue Amitriptyline 100 mg daily -Repeat PHQ-9 questionnaire at next visit

## 2015-08-02 NOTE — Progress Notes (Signed)
I saw and evaluated the patient.  I personally confirmed the key portions of Dr. Elon Jester history and exam and reviewed pertinent patient test results.  The assessment, diagnosis, and plan were formulated together and I agree with the documentation in the resident's note.

## 2015-08-12 ENCOUNTER — Telehealth: Payer: Self-pay | Admitting: *Deleted

## 2015-08-12 NOTE — Telephone Encounter (Signed)
LEFT VOICE MESSAGE FOR PATIENT TO RETURN CALL TO CLINIC. NEED TO MAKE REFERRAL APPOINTMENT FOR THE PATIENT, NEED TO KNOW WHEN HE HAS DIALYSIS.

## 2015-08-28 ENCOUNTER — Observation Stay (HOSPITAL_COMMUNITY)
Admission: EM | Admit: 2015-08-28 | Discharge: 2015-08-29 | Disposition: A | Discharge status: Home / Self Care | Payer: Medicare Other | Attending: Student in an Organized Health Care Education/Training Program | Admitting: Student in an Organized Health Care Education/Training Program

## 2015-08-28 ENCOUNTER — Encounter (HOSPITAL_COMMUNITY): Payer: Self-pay

## 2015-08-28 ENCOUNTER — Emergency Department (HOSPITAL_COMMUNITY): Payer: Medicare Other

## 2015-08-28 DIAGNOSIS — I5032 Chronic diastolic (congestive) heart failure: Secondary | ICD-10-CM | POA: Diagnosis not present

## 2015-08-28 DIAGNOSIS — Z8611 Personal history of tuberculosis: Secondary | ICD-10-CM | POA: Insufficient documentation

## 2015-08-28 DIAGNOSIS — I25119 Atherosclerotic heart disease of native coronary artery with unspecified angina pectoris: Secondary | ICD-10-CM | POA: Insufficient documentation

## 2015-08-28 DIAGNOSIS — K921 Melena: Secondary | ICD-10-CM | POA: Diagnosis not present

## 2015-08-28 DIAGNOSIS — Z7902 Long term (current) use of antithrombotics/antiplatelets: Secondary | ICD-10-CM | POA: Diagnosis not present

## 2015-08-28 DIAGNOSIS — N186 End stage renal disease: Secondary | ICD-10-CM | POA: Diagnosis present

## 2015-08-28 DIAGNOSIS — E1142 Type 2 diabetes mellitus with diabetic polyneuropathy: Secondary | ICD-10-CM | POA: Diagnosis not present

## 2015-08-28 DIAGNOSIS — E119 Type 2 diabetes mellitus without complications: Secondary | ICD-10-CM | POA: Insufficient documentation

## 2015-08-28 DIAGNOSIS — E785 Hyperlipidemia, unspecified: Secondary | ICD-10-CM | POA: Diagnosis not present

## 2015-08-28 DIAGNOSIS — Z87891 Personal history of nicotine dependence: Secondary | ICD-10-CM | POA: Insufficient documentation

## 2015-08-28 DIAGNOSIS — R000 Tachycardia, unspecified: Secondary | ICD-10-CM | POA: Diagnosis not present

## 2015-08-28 DIAGNOSIS — R0789 Other chest pain: Secondary | ICD-10-CM | POA: Diagnosis not present

## 2015-08-28 DIAGNOSIS — Z9861 Coronary angioplasty status: Secondary | ICD-10-CM

## 2015-08-28 DIAGNOSIS — I120 Hypertensive chronic kidney disease with stage 5 chronic kidney disease or end stage renal disease: Secondary | ICD-10-CM | POA: Insufficient documentation

## 2015-08-28 DIAGNOSIS — R0602 Shortness of breath: Secondary | ICD-10-CM | POA: Insufficient documentation

## 2015-08-28 DIAGNOSIS — R079 Chest pain, unspecified: Secondary | ICD-10-CM | POA: Diagnosis present

## 2015-08-28 DIAGNOSIS — I10 Essential (primary) hypertension: Secondary | ICD-10-CM

## 2015-08-28 DIAGNOSIS — I2510 Atherosclerotic heart disease of native coronary artery without angina pectoris: Secondary | ICD-10-CM | POA: Diagnosis not present

## 2015-08-28 DIAGNOSIS — Z79899 Other long term (current) drug therapy: Secondary | ICD-10-CM | POA: Diagnosis not present

## 2015-08-28 DIAGNOSIS — E1140 Type 2 diabetes mellitus with diabetic neuropathy, unspecified: Secondary | ICD-10-CM | POA: Diagnosis present

## 2015-08-28 DIAGNOSIS — Z992 Dependence on renal dialysis: Secondary | ICD-10-CM | POA: Insufficient documentation

## 2015-08-28 DIAGNOSIS — Z7982 Long term (current) use of aspirin: Secondary | ICD-10-CM | POA: Insufficient documentation

## 2015-08-28 DIAGNOSIS — Z9981 Dependence on supplemental oxygen: Secondary | ICD-10-CM | POA: Insufficient documentation

## 2015-08-28 DIAGNOSIS — Z8639 Personal history of other endocrine, nutritional and metabolic disease: Secondary | ICD-10-CM

## 2015-08-28 DIAGNOSIS — I5189 Other ill-defined heart diseases: Secondary | ICD-10-CM | POA: Diagnosis present

## 2015-08-28 DIAGNOSIS — I519 Heart disease, unspecified: Secondary | ICD-10-CM

## 2015-08-28 DIAGNOSIS — K219 Gastro-esophageal reflux disease without esophagitis: Secondary | ICD-10-CM | POA: Diagnosis present

## 2015-08-28 LAB — BASIC METABOLIC PANEL
ANION GAP: 15 (ref 5–15)
BUN: 32 mg/dL — ABNORMAL HIGH (ref 6–20)
CALCIUM: 9.6 mg/dL (ref 8.9–10.3)
CO2: 28 mmol/L (ref 22–32)
Chloride: 91 mmol/L — ABNORMAL LOW (ref 101–111)
Creatinine, Ser: 10.24 mg/dL — ABNORMAL HIGH (ref 0.61–1.24)
GFR calc Af Amer: 6 mL/min — ABNORMAL LOW (ref >60)
GFR, EST NON AFRICAN AMERICAN: 5 mL/min — AB (ref >60)
Glucose, Bld: 147 mg/dL — ABNORMAL HIGH (ref 65–99)
POTASSIUM: 5.5 mmol/L — AB (ref 3.5–5.1)
SODIUM: 134 mmol/L — AB (ref 135–145)

## 2015-08-28 LAB — CBC
HEMATOCRIT: 45.4 % (ref 39.0–52.0)
HEMOGLOBIN: 14.7 g/dL (ref 13.0–17.0)
MCH: 27.4 pg (ref 26.0–34.0)
MCHC: 32.4 g/dL (ref 30.0–36.0)
MCV: 84.7 fL (ref 78.0–100.0)
Platelets: 157 10*3/uL (ref 150–400)
RBC: 5.36 MIL/uL (ref 4.22–5.81)
RDW: 17.2 % — AB (ref 11.5–15.5)
WBC: 5.8 10*3/uL (ref 4.0–10.5)

## 2015-08-28 LAB — TROPONIN I: TROPONIN I: 0.03 ng/mL (ref <0.031)

## 2015-08-28 LAB — I-STAT TROPONIN, ED
TROPONIN I, POC: 0.02 ng/mL (ref 0.00–0.08)
TROPONIN I, POC: 0.02 ng/mL (ref 0.00–0.08)

## 2015-08-28 MED ORDER — SEVELAMER CARBONATE 800 MG PO TABS
2400.0000 mg | ORAL_TABLET | Freq: Three times a day (TID) | ORAL | Status: DC
  Administered 2015-08-29 (×2): 2400 mg via ORAL
  Filled 2015-08-28 (×2): qty 3

## 2015-08-28 MED ORDER — IPRATROPIUM-ALBUTEROL 20-100 MCG/ACT IN AERS: 2.0000 | INHALATION_SPRAY | Freq: Four times a day (QID) | RESPIRATORY_TRACT | Status: DC | PRN

## 2015-08-28 MED ORDER — AMLODIPINE BESYLATE 10 MG PO TABS
10.0000 mg | ORAL_TABLET | Freq: Every day | ORAL | Status: DC
  Administered 2015-08-28 – 2015-08-29 (×2): 10 mg via ORAL
  Filled 2015-08-28 (×2): qty 1

## 2015-08-28 MED ORDER — AMITRIPTYLINE HCL 25 MG PO TABS
100.0000 mg | ORAL_TABLET | Freq: Every day | ORAL | Status: DC
  Administered 2015-08-28: 100 mg via ORAL
  Filled 2015-08-28: qty 4

## 2015-08-28 MED ORDER — SODIUM CHLORIDE 0.9 % IV SOLN: 100.0000 mL | INTRAVENOUS | Status: DC | PRN

## 2015-08-28 MED ORDER — SODIUM POLYSTYRENE SULFONATE 15 GM/60ML PO SUSP
30.0000 g | Freq: Once | ORAL | Status: AC
  Administered 2015-08-28: 30 g via ORAL
  Filled 2015-08-28: qty 120

## 2015-08-28 MED ORDER — HEPARIN SODIUM (PORCINE) 1000 UNIT/ML DIALYSIS
1000.0000 [IU] | INTRAMUSCULAR | Status: DC | PRN
  Filled 2015-08-28: qty 1

## 2015-08-28 MED ORDER — SODIUM CHLORIDE 0.9 % IV SOLN
100.0000 mL | INTRAVENOUS | Status: DC | PRN
Start: 2015-08-28 — End: 2015-08-29

## 2015-08-28 MED ORDER — CLOPIDOGREL BISULFATE 75 MG PO TABS
75.0000 mg | ORAL_TABLET | Freq: Every day | ORAL | Status: DC
  Administered 2015-08-29: 75 mg via ORAL
  Filled 2015-08-28: qty 1

## 2015-08-28 MED ORDER — ASPIRIN 81 MG PO CHEW
81.0000 mg | CHEWABLE_TABLET | Freq: Every day | ORAL | Status: DC
  Administered 2015-08-29: 81 mg via ORAL
  Filled 2015-08-28: qty 1

## 2015-08-28 MED ORDER — IPRATROPIUM-ALBUTEROL 0.5-2.5 (3) MG/3ML IN SOLN: 3.0000 mL | Freq: Four times a day (QID) | RESPIRATORY_TRACT | Status: DC | PRN

## 2015-08-28 MED ORDER — LIDOCAINE-PRILOCAINE 2.5-2.5 % EX CREA
1.0000 "application " | TOPICAL_CREAM | CUTANEOUS | Status: DC | PRN
  Filled 2015-08-28: qty 5

## 2015-08-28 MED ORDER — SODIUM CHLORIDE 0.9 % IJ SOLN
3.0000 mL | Freq: Two times a day (BID) | INTRAMUSCULAR | Status: DC
  Administered 2015-08-28 – 2015-08-29 (×2): 3 mL via INTRAVENOUS

## 2015-08-28 MED ORDER — HEPARIN SODIUM (PORCINE) 1000 UNIT/ML DIALYSIS
3000.0000 [IU] | INTRAMUSCULAR | Status: DC | PRN
  Filled 2015-08-28: qty 3

## 2015-08-28 MED ORDER — ATORVASTATIN CALCIUM 20 MG PO TABS
20.0000 mg | ORAL_TABLET | Freq: Every day | ORAL | Status: DC
Start: 2015-08-28 — End: 2015-08-29
  Administered 2015-08-28: 20 mg via ORAL
  Filled 2015-08-28: qty 1

## 2015-08-28 MED ORDER — LIDOCAINE HCL (PF) 1 % IJ SOLN: 5.0000 mL | INTRAMUSCULAR | Status: DC | PRN

## 2015-08-28 MED ORDER — CINACALCET HCL 30 MG PO TABS
90.0000 mg | ORAL_TABLET | Freq: Every day | ORAL | Status: DC
Start: 2015-08-28 — End: 2015-08-29
  Administered 2015-08-28: 90 mg via ORAL
  Filled 2015-08-28 (×2): qty 3

## 2015-08-28 MED ORDER — MECLIZINE HCL 25 MG PO TABS
25.0000 mg | ORAL_TABLET | Freq: Three times a day (TID) | ORAL | Status: DC | PRN
Start: 2015-08-28 — End: 2015-08-29

## 2015-08-28 MED ORDER — RENA-VITE PO TABS
1.0000 | ORAL_TABLET | Freq: Every day | ORAL | Status: DC
  Administered 2015-08-28: 1 via ORAL
  Filled 2015-08-28: qty 1

## 2015-08-28 MED ORDER — PENTAFLUOROPROP-TETRAFLUOROETH EX AERO: 1.0000 "application " | INHALATION_SPRAY | CUTANEOUS | Status: DC | PRN

## 2015-08-28 MED ORDER — CARVEDILOL 25 MG PO TABS
50.0000 mg | ORAL_TABLET | Freq: Two times a day (BID) | ORAL | Status: DC
  Administered 2015-08-28 – 2015-08-29 (×2): 50 mg via ORAL
  Filled 2015-08-28 (×2): qty 2

## 2015-08-28 MED ORDER — HYDROCODONE-ACETAMINOPHEN 5-325 MG PO TABS
1.0000 | ORAL_TABLET | ORAL | Status: DC | PRN
  Administered 2015-08-28: 2 via ORAL
  Filled 2015-08-28: qty 2

## 2015-08-28 MED ORDER — PREGABALIN 50 MG PO CAPS
50.0000 mg | ORAL_CAPSULE | Freq: Every day | ORAL | Status: DC
  Administered 2015-08-28 – 2015-08-29 (×2): 50 mg via ORAL
  Filled 2015-08-28 (×2): qty 1

## 2015-08-28 MED ORDER — ISOSORBIDE MONONITRATE ER 30 MG PO TB24
30.0000 mg | ORAL_TABLET | Freq: Every day | ORAL | Status: DC
  Administered 2015-08-28 – 2015-08-29 (×2): 30 mg via ORAL
  Filled 2015-08-28 (×2): qty 1

## 2015-08-28 MED ORDER — HEPARIN SODIUM (PORCINE) 5000 UNIT/ML IJ SOLN
5000.0000 [IU] | Freq: Three times a day (TID) | INTRAMUSCULAR | Status: DC
  Administered 2015-08-28 – 2015-08-29 (×2): 5000 [IU] via SUBCUTANEOUS
  Filled 2015-08-28 (×2): qty 1

## 2015-08-28 MED ORDER — VENLAFAXINE HCL ER 75 MG PO CP24
75.0000 mg | ORAL_CAPSULE | Freq: Every day | ORAL | Status: DC
  Administered 2015-08-28: 75 mg via ORAL
  Filled 2015-08-28 (×2): qty 1

## 2015-08-28 MED ORDER — PANTOPRAZOLE SODIUM 40 MG PO TBEC
40.0000 mg | DELAYED_RELEASE_TABLET | Freq: Every day | ORAL | Status: DC
  Administered 2015-08-28 – 2015-08-29 (×2): 40 mg via ORAL
  Filled 2015-08-28 (×2): qty 1

## 2015-08-28 MED ORDER — HYDROMORPHONE HCL 1 MG/ML IJ SOLN
1.0000 mg | Freq: Once | INTRAMUSCULAR | Status: AC
  Administered 2015-08-28: 1 mg via INTRAVENOUS
  Filled 2015-08-28: qty 1

## 2015-08-28 MED ORDER — ALTEPLASE 2 MG IJ SOLR
2.0000 mg | Freq: Once | INTRAMUSCULAR | Status: DC | PRN
  Filled 2015-08-28: qty 2

## 2015-08-28 MED ORDER — IOHEXOL 350 MG/ML SOLN
100.0000 mL | Freq: Once | INTRAVENOUS | Status: AC | PRN
  Administered 2015-08-28: 75 mL via INTRAVENOUS

## 2015-08-28 NOTE — ED Notes (Signed)
Internal medicine at bedside to assess the patient.

## 2015-08-28 NOTE — ED Provider Notes (Signed)
CSN: [id number]     Arrival date & time 08/28/15  1253 History   First MD Initiated Contact with Patient 08/28/15 1323     Chief Complaint  Patient presents with  . Chest Pain     (Consider location/radiation/quality/duration/timing/severity/associated sxs/prior Treatment) Patient is a 59 y.o. male presenting with chest pain.  Chest Pain Pain location:  Substernal area Pain quality: sharp   Pain radiates to:  Does not radiate Pain radiates to the back: no   Pain severity:  Moderate Onset quality:  Gradual Duration:  3 days Timing:  Constant Progression:  Unchanged Chronicity:  Recurrent Context: at rest   Context comment:  Began after dialysis Relieved by:  Nothing Worsened by:  Deep breathing and movement Associated symptoms: shortness of breath   Associated symptoms: no abdominal pain, no nausea and not vomiting     Past Medical History  Diagnosis Date  . Hypertension   . Hematochezia     a. 2014: colonscopy, which showed moderately-sized internal hemorrhoids, two 72mm polyps in transverse colon and ascending colon that were resected, five 2-42mm polyps in sigmoid colon, descending colon, transverse colon, and ascending colon that were resected. An upper endoscopy was performed and showed normal esophagus, stomach, and duodenum.  . Hematuria     a. H/o hematuria 2014 with cystoscopy that was unrevealing for his source of hematuria. He underwent a kidney ultrasound on 10/14 that showed mildly echogenic and scarred kidneys compatible with medical renal disease, without hydronephrosis or renal calculi.  [name] Anemia   . CAD (coronary artery disease)     a. per [facility] s/p 3.56mm x 54mm Vision BMS to mid LAD 12/2009 and Xience DES to mid LAD 10/2010.  . Colon polyps   . Chronic diastolic CHF (congestive heart failure) ([place])   . Hyperlipidemia   . Anginal pain ([place])   . Heart murmur   . 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 Type II diabetes  mellitus ([place])   . History of blood transfusion     "had colonoscopy done; they had to give me some blood"  . Daily headache   . ESRD on dialysis [facility]) since ~ 2008    "[place]; TTS" (07/21/2015)  . On home oxygen therapy     "2L prn" (07/21/2015)   Past Surgical History  Procedure Laterality Date  . Left heart catheterization with coronary angiogram N/A 11/23/2014    Procedure: LEFT HEART CATHETERIZATION WITH CORONARY ANGIOGRAM;  Surgeon: Troy Sine, MD;  Location: [facility] CATH LAB;  Service: Cardiovascular;  Laterality: N/A;  . Lithotripsy  X1  . Cystoscopy w/ stone manipulation  X2?  . Cardiac catheterization  "several"  . Coronary angioplasty with stent placement  "several"  . Eye surgery Bilateral     "laser OR for hemorrhage"  . Av fistula placement Left ~ 2007    "upper arm"   Family History  Problem Relation Age of Onset  . Hypertension    . Bone cancer Mother   . Anuerysm Father   . Diabetes type II Daughter    Social History  Substance Use Topics  . Smoking status: Former Smoker -- 0.50 packs/day for 8 years    Types: Cigarettes    Quit date: 12/06/2010  . Smokeless tobacco: Never Used  . Alcohol Use: No    Review of Systems  Respiratory: Positive for shortness of breath.   Cardiovascular: Positive for chest pain.  Gastrointestinal: Negative  for nausea, vomiting and abdominal pain.  All other systems reviewed and are negative.     Allergies  Enalapril  Home Medications   Prior to Admission medications   Medication Sig Start Date End Date Taking? Authorizing Provider  acetaminophen (TYLENOL) 500 MG tablet Take 500 mg by mouth every 6 (six) hours as needed for headache.    Yes Historical Provider, MD  amitriptyline (ELAVIL) 100 MG tablet Take 1 tablet (100 mg total) by mouth at bedtime. 07/27/15  Yes Shela Leff, MD  amLODipine (NORVASC) 10 MG tablet Take 10 mg by mouth daily.   Yes Historical Provider, MD  aspirin 81 MG chewable tablet Chew 1  tablet (81 mg total) by mouth daily. 07/22/15  Yes Iline Oven, MD  atorvastatin (LIPITOR) 20 MG tablet Take 20 mg by mouth at bedtime.    Yes Historical Provider, MD  carvedilol (COREG) 25 MG tablet Take 50 mg by mouth 2 (two) times daily.    Yes Historical Provider, MD  cinacalcet (SENSIPAR) 90 MG tablet Take 90 mg by mouth at bedtime.    Yes Historical Provider, MD  clopidogrel (PLAVIX) 75 MG tablet Take 1 tablet (75 mg total) by mouth daily. 11/24/14  Yes Juluis Mire, MD  Darbepoetin Alfa (ARANESP) 25 MCG/0.42ML SOSY injection Inject 0.42 mLs (25 mcg total) into the vein every Tuesday with hemodialysis. 12/15/14  Yes Geradine Girt, DO  doxercalciferol (HECTOROL) 4 MCG/2ML injection Inject 1 mL (2 mcg total) into the vein Every Tuesday,Thursday,and Saturday with dialysis. 12/15/14  Yes Geradine Girt, DO  ferric gluconate 62.5 mg in sodium chloride 0.9 % 100 mL Inject 62.5 mg into the vein every Thursday with hemodialysis. 12/16/14  Yes Geradine Girt, DO  Ipratropium-Albuterol (COMBIVENT RESPIMAT) 20-100 MCG/ACT AERS respimat Inhale 2 puffs into the lungs every 6 (six) hours as needed for wheezing.   Yes Historical Provider, MD  isosorbide mononitrate (IMDUR) 30 MG 24 hr tablet Take 1 tablet (30 mg total) by mouth daily. 07/27/15  Yes Shela Leff, MD  lidocaine (LIDODERM) 5 % Place 1 patch onto the skin daily. Remove & Discard patch within 12 hours or as directed by MD 06/05/15  Yes Janece Canterbury, MD  meclizine (ANTIVERT) 25 MG tablet Take 25 mg by mouth 3 (three) times daily as needed for dizziness.   Yes Historical Provider, MD  multivitamin (RENA-VIT) TABS tablet Take 1 tablet by mouth at bedtime. 12/15/14  Yes Jessica U Vann, DO  pantoprazole (PROTONIX) 40 MG tablet Take 1 tablet (40 mg total) by mouth daily. 06/05/15  Yes Janece Canterbury, MD  pregabalin (LYRICA) 50 MG capsule Take 1 capsule (50 mg total) by mouth daily. 12/15/14  Yes Geradine Girt, DO  sevelamer carbonate (RENVELA) 800  MG tablet Take 2,400 mg by mouth 3 (three) times daily with meals.    Yes Historical Provider, MD  venlafaxine XR (EFFEXOR XR) 75 MG 24 hr capsule Take 1 capsule (75 mg total) by mouth daily with breakfast. 07/27/15  Yes Shela Leff, MD  nitroGLYCERIN (NITROSTAT) 0.4 MG SL tablet Place 1 tablet (0.4 mg total) under the tongue every 5 (five) minutes as needed for chest pain. 06/05/15   Janece Canterbury, MD   BP 163/96 mmHg  Pulse 102  Temp(Src) 98.5 F (36.9 C) (Oral)  Resp 22  SpO2 97% Physical Exam  Constitutional: He is oriented to person, place, and time. He appears well-developed and well-nourished.  HENT:  Head: Normocephalic and atraumatic.  Eyes: Conjunctivae and EOM  are normal.  Neck: Normal range of motion. Neck supple.  Cardiovascular: Regular rhythm and normal heart sounds.  Tachycardia present.   Pulmonary/Chest: Effort normal and breath sounds normal. No respiratory distress.  Abdominal: He exhibits no distension. There is no tenderness. There is no rebound and no guarding.  Musculoskeletal: Normal range of motion.  Neurological: He is alert and oriented to person, place, and time.  Skin: Skin is warm and dry.  Vitals reviewed.   ED Course  Procedures (including critical care time) Labs Review Labs Reviewed  BASIC METABOLIC PANEL - Abnormal; Notable for the following:    Sodium 134 (*)    Potassium 5.5 (*)    Chloride 91 (*)    Glucose, Bld 147 (*)    BUN 32 (*)    Creatinine, Ser 10.24 (*)    GFR calc non Af Amer 5 (*)    GFR calc Af Amer 6 (*)    All other components within normal limits  CBC - Abnormal; Notable for the following:    RDW 17.2 (*)    All other components within normal limits  I-STAT TROPOININ, ED  Randolm Idol, ED    Imaging Review Dg Chest 2 View  08/28/2015  CLINICAL DATA:  Chest pain and hurts to breathe. EXAM: CHEST  2 VIEW COMPARISON:  07/21/2015 FINDINGS: Lung markings are mildly prominent but no evidence for focal airspace  disease or pulmonary edema. Heart and mediastinum are within normal limits. No acute bone abnormality. Negative for pleural effusions. Evidence for coronary artery calcifications. IMPRESSION: No active cardiopulmonary disease. Electronically Signed   By: Markus Daft M.D.   On: 08/28/2015 14:16   Ct Angio Chest Pe W/cm &/or Wo Cm  08/28/2015  CLINICAL DATA:  Pleuritic chest pain. EXAM: CT ANGIOGRAPHY CHEST WITH CONTRAST TECHNIQUE: Multidetector CT imaging of the chest was performed using the standard protocol during bolus administration of intravenous contrast. Multiplanar CT image reconstructions and MIPs were obtained to evaluate the vascular anatomy. CONTRAST:  87mL OMNIPAQUE IOHEXOL 350 MG/ML SOLN COMPARISON:  05/17/2015 chest CT angiogram. Chest radiograph from earlier today. FINDINGS: Mediastinum/Nodes: The study is high quality for the evaluation of pulmonary embolism. There are no filling defects in the central, lobar, segmental or subsegmental pulmonary artery branches to suggest acute pulmonary embolism. Atherosclerotic nonaneurysmal thoracic aorta. Normal caliber pulmonary arteries. Normal heart size. No pericardial fluid/thickening. Left anterior descending, left circumflex and right coronary atherosclerosis. Normal visualized thyroid. Normal esophagus. No pathologically enlarged axillary, mediastinal or hilar lymph nodes. Lungs/Pleura: No pneumothorax. No pleural effusion. No acute consolidative airspace disease, significant pulmonary nodules or lung masses. There is a stable mosaic attenuation throughout both lungs. Upper abdomen: There is perinephric fat stranding surrounding the visualized upper left kidney, nonspecific, unchanged. Musculoskeletal: No aggressive appearing focal osseous lesions. Stable T8 vertebral hemangioma. Mild degenerative changes in the thoracic spine. Review of the MIP images confirms the above findings. IMPRESSION: 1. No evidence of pulmonary embolism. 2. Mosaic attenuation  throughout both lungs, unchanged, a nonspecific finding most commonly secondary to air trapping/ small airway disease, although the differential includes pulmonary vascular disease. 3. Three-vessel coronary atherosclerosis. Electronically Signed   By: Ilona Sorrel M.D.   On: 08/28/2015 16:02   I have personally reviewed and evaluated these images and lab results as part of my medical decision-making.   EKG Interpretation   Date/Time:  Sunday August 28 2015 13:02:45 EST Ventricular Rate:  116 PR Interval:  136 QRS Duration: 100 QT Interval:  350 QTC Calculation:  486 R Axis:   66 Text Interpretation:  Sinus tachycardia Minimal voltage criteria for LVH,  may be normal variant Borderline ECG SINCE LAST TRACING HEART RATE HAS  INCREASED Confirmed by Debby Freiberg [phone]) on 08/28/2015 1:25:26 PM      MDM   Final diagnoses:  None    59 y.o. male with pertinent PMH of ESRD (TRSa, all but 10 min yesterday due to leg cramps) presents with dyspnea similar to prior episodes of fluid overload with pleuritic chest pain x 3-4 days.  No fevers, gi symptoms.  On arrival vitals and physical exam as above.  Pt has a ho admission for similar chest pain which was unremarkable   Wu unremarkable for acute pathology with exception of hyperkalemia and ongoing chest pain.  Consulted medicine for admission  I have reviewed all laboratory and imaging studies if ordered as above Chest pain  Debby Freiberg, MD 08/28/15 [phone]

## 2015-08-28 NOTE — Progress Notes (Signed)
Patient educated on the reason we cut bed alarms on patients at night. Patient refused to have the bed alarm on and stated he would press his call bell if he needed to get out of bed for anything.

## 2015-08-28 NOTE — ED Notes (Signed)
Onset 3-4 days anterior chest pain, mild shortness of breath.  Pt states he gets chest pain when he has fluid build up.  Pt reports only had 10 minutes of dialysis yesterday d/t leg and hand cramping.  Pt talking in complete sentences, resp e/u.

## 2015-08-28 NOTE — H&P (Signed)
Date: 08/28/2015               Patient Name:  [name] [name] MRN: [id number]  DOB: [birth year] Age / Sex: 59 y.o., male   PCP: [name]         Medical Service: Internal Medicine Teaching Service         Attending Physician: Dr. Axel Filler, MD    First Contact: Dr. Benjamine Mola Pager: [phone]  Second Contact: Dr. Arcelia Jew Pager: [phone]       After Hours (After 5p/  First Contact Pager: [phone]  weekends / holidays): Second Contact Pager: [phone]   Chief Complaint: Chest Pain  History of Present Illness: 59 y/o with PMHx of ESRD on HD [facility], T2DM, CAD s/p stenting in 2011 and 2012 presents with chest pain lasting approximately the past 5 days. This pain started during hemodialysis last week and he was requiring sessions to end 10 minutes early due to cramping and pain. Yesterday the pain did not improve later in the day after HD and by this morning he came to the hospital for evaluation. It is painful on his anterior chest bilaterally, sharp in character, and worsens on deep inspiration. He feels this pain is similar to when he retained too much fluid in the past. He has tried tylenol at home with little improvement. He is not feeling weaker than usual or short of breath. He is not diaphoretic He denies nausea or diarrhea, but states he does vomit food regularly which is longstanding.  Of note this is [name] [name] 8th hospital admission this year, the majority of which are related to recurrence of chest pain. He underwent heat catheterization in 11/2014 with all stents found to be patent >80%, with Myoview repeated in 05/2015. He also has had variation in his weights that may be contributing to difficulty with his HD regimen.  Meds: Current Facility-Administered Medications  Medication Dose Route Frequency Provider Last Rate Last Dose  . HYDROcodone-acetaminophen (NORCO/VICODIN) 5-325 MG per tablet 1-2 tablet  1-2 tablet Oral Q4H PRN Juliet Rude, MD   2 tablet at 08/28/15 1722    . sodium polystyrene (KAYEXALATE) 15 GM/60ML suspension 30 g  30 g Oral Once Juliet Rude, MD       Current Outpatient Prescriptions  Medication Sig Dispense Refill  . acetaminophen (TYLENOL) 500 MG tablet Take 500 mg by mouth every 6 (six) hours as needed for headache.     [name] amitriptyline (ELAVIL) 100 MG tablet Take 1 tablet (100 mg total) by mouth at bedtime. 30 tablet 2  . amLODipine (NORVASC) 10 MG tablet Take 10 mg by mouth daily.    [name] aspirin 81 MG chewable tablet Chew 1 tablet (81 mg total) by mouth daily.    [name] atorvastatin (LIPITOR) 20 MG tablet Take 20 mg by mouth at bedtime.     . carvedilol (COREG) 25 MG tablet Take 50 mg by mouth 2 (two) times daily.     . cinacalcet (SENSIPAR) 90 MG tablet Take 90 mg by mouth at bedtime.     . clopidogrel (PLAVIX) 75 MG tablet Take 1 tablet (75 mg total) by mouth daily. 30 tablet 11  . Darbepoetin Alfa (ARANESP) 25 MCG/0.42ML SOSY injection Inject 0.42 mLs (25 mcg total) into the vein every Tuesday with hemodialysis. 14.28 mL   . doxercalciferol (HECTOROL) 4 MCG/2ML injection Inject 1 mL (2 mcg total) into the vein Every Tuesday,Thursday,and Saturday with dialysis. 2 mL   . ferric gluconate  62.5 mg in sodium chloride 0.9 % 100 mL Inject 62.5 mg into the vein every Thursday with hemodialysis.    . Ipratropium-Albuterol (COMBIVENT RESPIMAT) 20-100 MCG/ACT AERS respimat Inhale 2 puffs into the lungs every 6 (six) hours as needed for wheezing.    . isosorbide mononitrate (IMDUR) 30 MG 24 hr tablet Take 1 tablet (30 mg total) by mouth daily. 30 tablet 2  . lidocaine (LIDODERM) 5 % Place 1 patch onto the skin daily. Remove & Discard patch within 12 hours or as directed by MD 30 patch 0  . meclizine (ANTIVERT) 25 MG tablet Take 25 mg by mouth 3 (three) times daily as needed for dizziness.    . multivitamin (RENA-VIT) TABS tablet Take 1 tablet by mouth at bedtime. 30 tablet 0  . pantoprazole (PROTONIX) 40 MG tablet Take 1 tablet (40 mg total) by mouth  daily. 30 tablet 0  . pregabalin (LYRICA) 50 MG capsule Take 1 capsule (50 mg total) by mouth daily. 30 capsule 0  . sevelamer carbonate (RENVELA) 800 MG tablet Take 2,400 mg by mouth 3 (three) times daily with meals.     . venlafaxine XR (EFFEXOR XR) 75 MG 24 hr capsule Take 1 capsule (75 mg total) by mouth daily with breakfast. 30 capsule 2  . nitroGLYCERIN (NITROSTAT) 0.4 MG SL tablet Place 1 tablet (0.4 mg total) under the tongue every 5 (five) minutes as needed for chest pain. 30 tablet 0    Allergies: Allergies as of 08/28/2015 - Review Complete 08/28/2015  Allergen Reaction Noted  . Enalapril Hives 04/26/2014   Past Medical History  Diagnosis Date  . Hypertension   . Hematochezia     a. 2014: colonscopy, which showed moderately-sized internal hemorrhoids, two 62mm polyps in transverse colon and ascending colon that were resected, five 2-33mm polyps in sigmoid colon, descending colon, transverse colon, and ascending colon that were resected. An upper endoscopy was performed and showed normal esophagus, stomach, and duodenum.  . Hematuria     a. H/o hematuria 2014 with cystoscopy that was unrevealing for his source of hematuria. He underwent a kidney ultrasound on 10/14 that showed mildly echogenic and scarred kidneys compatible with medical renal disease, without hydronephrosis or renal calculi.  [name] Anemia   . CAD (coronary artery disease)     a. per [facility] s/p 3.32mm x 110mm Vision BMS to mid LAD 12/2009 and Xience DES to mid LAD 10/2010.  . Colon polyps   . Chronic diastolic CHF (congestive heart failure) ([place])   . Hyperlipidemia   . Anginal pain ([place])   . Heart murmur   . 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 Type II diabetes mellitus ([place])   . History of blood transfusion     "had colonoscopy done; they had to give me some blood"  . Daily headache   . ESRD on dialysis [facility]) since ~ 2008    "[place]; TTS" (07/21/2015)  . On home oxygen  therapy     "2L prn" (07/21/2015)   Past Surgical History  Procedure Laterality Date  . Left heart catheterization with coronary angiogram N/A 11/23/2014    Procedure: LEFT HEART CATHETERIZATION WITH CORONARY ANGIOGRAM;  Surgeon: Troy Sine, MD;  Location: [facility] CATH LAB;  Service: Cardiovascular;  Laterality: N/A;  . Lithotripsy  X1  . Cystoscopy w/ stone manipulation  X2?  . Cardiac catheterization  "several"  . Coronary angioplasty with stent placement  "several"  .  Eye surgery Bilateral     "laser OR for hemorrhage"  . Av fistula placement Left ~ 2007    "upper arm"   Family History  Problem Relation Age of Onset  . Hypertension    . Bone cancer Mother   . Anuerysm Father   . Diabetes type II Daughter    Social History   Social History  . Marital Status: Divorced    Spouse Name: N/A  . Number of Children: N/A  . Years of Education: N/A   Occupational History  . Not on file.   Social History Main Topics  . Smoking status: Former Smoker -- 0.50 packs/day for 8 years    Types: Cigarettes    Quit date: 12/06/2010  . Smokeless tobacco: Never Used  . Alcohol Use: No  . Drug Use: No  . Sexual Activity: Not Currently   Other Topics Concern  . Not on file   Social History Narrative   Moved from [place], [place] to [place] in 11/2014.    Review of Systems: Review of Systems  Constitutional: Negative for fever and chills.  Eyes: Negative for blurred vision and double vision.  Respiratory: Positive for shortness of breath. Negative for cough and sputum production.   Cardiovascular: Positive for chest pain. Negative for orthopnea and leg swelling.  Gastrointestinal: Positive for vomiting. Negative for heartburn, diarrhea and constipation.  Neurological: Negative for dizziness, focal weakness and headaches.  Psychiatric/Behavioral: The patient does not have insomnia.     Physical Exam: Blood pressure 167/98, pulse 98, temperature 98.5 F (36.9 C), temperature  source Oral, resp. rate 20, SpO2 97 %.   GENERAL- alert, co-operative, NAD HEENT- Atraumatic, PERRL, EOMI, oral mucosa appears moist, dentures in CARDIAC- RRR, 3/6 holosystolic murmur audible throughout LUSB to apex RESP- CTAB, no wheezes or crackles. ABDOMEN- Soft, nontender, no guarding or rebound, normoactive bowel sounds present NEURO- No obvious Cr N abnormality, strength upper and lower extremities- 5/5 EXTREMITIES- UE AVG with thrill, symmetric, no pedal edema. SKIN- Warm, dry, numerous tattoos throughout torso and arms PSYCH- Normal mood and affect, appropriate thought content and speech.   Lab results: Basic Metabolic Panel:  Recent Labs  08/28/15 1327  NA 134*  K 5.5*  CL 91*  CO2 28  GLUCOSE 147*  BUN 32*  CREATININE 10.24*  CALCIUM 9.6   CBC:  Recent Labs  08/28/15 1327  WBC 5.8  HGB 14.7  HCT 45.4  MCV 84.7  PLT 157   Imaging results:  Dg Chest 2 View  08/28/2015  CLINICAL DATA:  Chest pain and hurts to breathe. EXAM: CHEST  2 VIEW COMPARISON:  07/21/2015 FINDINGS: Lung markings are mildly prominent but no evidence for focal airspace disease or pulmonary edema. Heart and mediastinum are within normal limits. No acute bone abnormality. Negative for pleural effusions. Evidence for coronary artery calcifications. IMPRESSION: No active cardiopulmonary disease. Electronically Signed   By: Markus Daft M.D.   On: 08/28/2015 14:16   Ct Angio Chest Pe W/cm &/or Wo Cm  08/28/2015  CLINICAL DATA:  Pleuritic chest pain. EXAM: CT ANGIOGRAPHY CHEST WITH CONTRAST TECHNIQUE: Multidetector CT imaging of the chest was performed using the standard protocol during bolus administration of intravenous contrast. Multiplanar CT image reconstructions and MIPs were obtained to evaluate the vascular anatomy. CONTRAST:  41mL OMNIPAQUE IOHEXOL 350 MG/ML SOLN COMPARISON:  05/17/2015 chest CT angiogram. Chest radiograph from earlier today. FINDINGS: Mediastinum/Nodes: The study is high  quality for the evaluation of pulmonary embolism. There are no filling  defects in the central, lobar, segmental or subsegmental pulmonary artery branches to suggest acute pulmonary embolism. Atherosclerotic nonaneurysmal thoracic aorta. Normal caliber pulmonary arteries. Normal heart size. No pericardial fluid/thickening. Left anterior descending, left circumflex and right coronary atherosclerosis. Normal visualized thyroid. Normal esophagus. No pathologically enlarged axillary, mediastinal or hilar lymph nodes. Lungs/Pleura: No pneumothorax. No pleural effusion. No acute consolidative airspace disease, significant pulmonary nodules or lung masses. There is a stable mosaic attenuation throughout both lungs. Upper abdomen: There is perinephric fat stranding surrounding the visualized upper left kidney, nonspecific, unchanged. Musculoskeletal: No aggressive appearing focal osseous lesions. Stable T8 vertebral hemangioma. Mild degenerative changes in the thoracic spine. Review of the MIP images confirms the above findings. IMPRESSION: 1. No evidence of pulmonary embolism. 2. Mosaic attenuation throughout both lungs, unchanged, a nonspecific finding most commonly secondary to air trapping/ small airway disease, although the differential includes pulmonary vascular disease. 3. Three-vessel coronary atherosclerosis. Electronically Signed   By: Ilona Sorrel M.D.   On: 08/28/2015 16:02    Other results: EKG: unchanged from previous tracings, sinus tachycardia, left axis deviation.  Assessment & Plan by Problem: Atypical chest pain: Chest pain of several day duration without associated hypotension, dizziness, or diaphoresis. Initial troponins negative 0.02, have been at least as high as 0.13 in the past due to ESRD alone. EKG showing sinus tachycardia, but CTA obtained was good quality and negative for a PE. -Cardiac monitoring -Trend troponins x3 -Will trial Norco up to 10mg  q4hrs PRN, will adjust to IV if  needed -Continuous SpO2, provide O2 PRN -Repeat AM EKG  ESRD on HD: Some electrolyte abnormalities today, K of 5.5, BUN only 32. His sessions are being cut short because he has been cramping worse than usual. Currently he has no EKG changes and extremely mild hyperkalemia. -Kayexalate 30g once -Repeat AM Bmet -Will get Nephrology recommendations if acute indication for HD or will not be discharged before Tuesday  Grade 2 Diastolic dysfunction: Multiple recent workups for similar chest pain, he is in a hard spot due to significant diastolic dysfunction and large volume shifts on dialysis. We will continue home regimen as appropriate. -amlodipine 10mg  PO -Atorvastatin 20mg  qhs -Coreg 50mg  tablet -Imdur 30mg   CAD s/p stenting: DES in 2009, BMS in 2011, recent cath in 11/2014 that showed 1-20% restenosis, no newly occluded vessels. Also recent Myoview in 05/2015 redemonstrated previous area of infarct, that was an intermediate risk study for active cardiac ischemic events -ASA 81mg  -Plavix 75mg   GERD: Not likely to be a cause of persistent upper chest wall pain, stable problem on PPI. Can offer GI cocktail if needed during hospital course -Protonix 40mg  PO daily  Diet: Carb modified DVT ppx: [place] heparin FULL CODE  Dispo: Disposition is deferred at this time, awaiting improvement of current medical problems. Anticipated discharge in approximately 1-2 day(s).   The patient does not have a current PCP ([name]) and does not know need an [facility] hospital follow-up appointment after discharge.  The patient does not know have transportation limitations that hinder transportation to clinic appointments.  Signed: Collier Salina, MD 08/28/2015, 6:23 PM

## 2015-08-29 DIAGNOSIS — R0602 Shortness of breath: Secondary | ICD-10-CM | POA: Diagnosis not present

## 2015-08-29 DIAGNOSIS — Z9861 Coronary angioplasty status: Secondary | ICD-10-CM

## 2015-08-29 DIAGNOSIS — I120 Hypertensive chronic kidney disease with stage 5 chronic kidney disease or end stage renal disease: Secondary | ICD-10-CM | POA: Diagnosis not present

## 2015-08-29 DIAGNOSIS — E119 Type 2 diabetes mellitus without complications: Secondary | ICD-10-CM

## 2015-08-29 DIAGNOSIS — I2510 Atherosclerotic heart disease of native coronary artery without angina pectoris: Secondary | ICD-10-CM

## 2015-08-29 DIAGNOSIS — R0789 Other chest pain: Secondary | ICD-10-CM | POA: Diagnosis not present

## 2015-08-29 DIAGNOSIS — R000 Tachycardia, unspecified: Secondary | ICD-10-CM | POA: Diagnosis not present

## 2015-08-29 DIAGNOSIS — Z794 Long term (current) use of insulin: Secondary | ICD-10-CM

## 2015-08-29 LAB — RENAL FUNCTION PANEL
ANION GAP: 14 (ref 5–15)
Albumin: 3.3 g/dL — ABNORMAL LOW (ref 3.5–5.0)
BUN: 39 mg/dL — AB (ref 6–20)
CHLORIDE: 92 mmol/L — AB (ref 101–111)
CO2: 29 mmol/L (ref 22–32)
CREATININE: 11.42 mg/dL — AB (ref 0.61–1.24)
Calcium: 8.7 mg/dL — ABNORMAL LOW (ref 8.9–10.3)
GFR calc non Af Amer: 4 mL/min — ABNORMAL LOW (ref >60)
GFR, EST AFRICAN AMERICAN: 5 mL/min — AB (ref >60)
Glucose, Bld: 120 mg/dL — ABNORMAL HIGH (ref 65–99)
Phosphorus: 9.8 mg/dL — ABNORMAL HIGH (ref 2.5–4.6)
Potassium: 4.8 mmol/L (ref 3.5–5.1)
Sodium: 135 mmol/L (ref 135–145)

## 2015-08-29 LAB — CBC
HEMATOCRIT: 41.2 % (ref 39.0–52.0)
HEMOGLOBIN: 13.6 g/dL (ref 13.0–17.0)
MCH: 27.4 pg (ref 26.0–34.0)
MCHC: 33 g/dL (ref 30.0–36.0)
MCV: 83.1 fL (ref 78.0–100.0)
Platelets: 99 10*3/uL — ABNORMAL LOW (ref 150–400)
RBC: 4.96 MIL/uL (ref 4.22–5.81)
RDW: 17.3 % — ABNORMAL HIGH (ref 11.5–15.5)
WBC: 4.9 10*3/uL (ref 4.0–10.5)

## 2015-08-29 LAB — TROPONIN I: TROPONIN I: 0.03 ng/mL (ref <0.031)

## 2015-08-29 MED ORDER — SIMETHICONE 80 MG PO CHEW: 80.0000 mg | CHEWABLE_TABLET | Freq: Four times a day (QID) | ORAL | Status: DC | PRN

## 2015-08-29 NOTE — Plan of Care (Signed)
Problem: Safety: Goal: Ability to remain free from injury will improve Outcome: Completed/Met Date Met:  08/29/15 Pt educated on safety measures put into place. Pt refusing bed alarm at this time. Pt verbalized he will call for help when getting up. Call bell within reach.

## 2015-08-29 NOTE — Plan of Care (Signed)
Problem: Physical Regulation: Goal: Will remain free from infection Outcome: Completed/Met Date Met:  08/29/15 Pt educated on infection prevention. Pt verbalized understanding.

## 2015-08-29 NOTE — Consult Note (Signed)
CARDIOLOGY CONSULT NOTE   Patient ID: [name] [name] MRN: [id number] DOB/AGE: [birth year] 57 y.o.  Admit date: 08/28/2015  Primary Physician   [name] Primary Cardiologist   (Dr. Alanda Amass [facility], last seen more than year ago). This year seen by different cardiologist -  [facility] - never followed up in clinic. (Dr. Johnsie Cancel saw first) Reason for Consultation   Chest pain  HPI: [name] [name] is a 59 y.o. 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   Had cardiac catheterization in 11/2014 showing normal LV function, no significant coronary obstructive disease with widely patent LAD stent in the proximal to midsegment with minimal 20% smooth in-stent narrowing and mild 20% ostial narrowing of the first diagonal branch within the stented segment.  Last echo 05/18/2015 showed LV EF of 45-50%, grade 2 DD - Direct image comparison with the study from February 2016confirms a slight reduction in LV systolic function and worsening secondary mitral insufficiency, as well as elevation in mean left atrial pressure.  Recent Myoview on 05/19/2015 that was an intermediate risk study. There moderate sized and intensity fixed, mid to apical inferolateral wall defect consistent with scar. No significant reversible ischemia is noted. LVEF 42% with inferolateral hypokinesis.  Of note this is [name] [name] 8th hospital admission this year, the majority of which are related to recurrence of chest pain. Last admission 07/21/15-07/22/15 for atypical chest pain. Serial ECG and troponins, which were negative. Imdur was increased to 30 mg daily for added control of patient's angina. Ranexa was also recommended by Cardiology (Dr. Ellyn Hack), but it has not been studied in dialysis  patients and was not initiated on discharge.   The patient continued to have recurrent chest pain during dialysis, however improved since last admission.  However yesterday his left sided sharp pain persisted that was worsen with movement which led him to ER presentation. The patient denies nausea, vomiting, fever,  palpitations, shortness of breath, orthopnea, PND, dizziness, syncope, cough, congestion, abdominal pain, hematochezia, melena, lower extremity edema. He initially thought that his left sided cp was due to fluid build up, however cxr clear then he thought it likely duet GERD. He dose have hx of acid reflex. Currently chest pain free. EKG non ischemic. Troponin x 3 negative. CTA without PE. He does not make any urine.      Past Medical History  Diagnosis Date  . Hypertension   . Hematochezia     a. 2014: colonscopy, which showed moderately-sized internal hemorrhoids, two 84mm polyps in transverse colon and ascending colon that were resected, five 2-12mm polyps in sigmoid colon, descending colon, transverse colon, and ascending colon that were resected. An upper endoscopy was performed and showed normal esophagus, stomach, and duodenum.  . Hematuria     a. H/o hematuria 2014 with cystoscopy that was unrevealing for his source of hematuria. He underwent a kidney ultrasound on 10/14 that showed mildly echogenic and scarred kidneys compatible with medical renal disease, without hydronephrosis or renal calculi.  [name] Anemia   . CAD (coronary artery disease)     a. per [facility] s/p 3.33mm x 21mm Vision BMS to mid LAD 12/2009 and Xience DES to mid LAD 10/2010.  . Colon polyps   . Chronic diastolic CHF (congestive heart failure) ([place])   .  Hyperlipidemia   . Anginal pain ([place])   . Heart murmur   . 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 Type II diabetes mellitus ([place])   . History of blood transfusion     "had colonoscopy done; they had to give me some blood"  .  Daily headache   . ESRD on dialysis [facility]) since ~ 2008    "[place]; TTS" (07/21/2015)  . On home oxygen therapy     "2L prn" (07/21/2015)     Past Surgical History  Procedure Laterality Date  . Left heart catheterization with coronary angiogram N/A 11/23/2014    Procedure: LEFT HEART CATHETERIZATION WITH CORONARY ANGIOGRAM;  Surgeon: Troy Sine, MD;  Location: [facility] CATH LAB;  Service: Cardiovascular;  Laterality: N/A;  . Lithotripsy  X1  . Cystoscopy w/ stone manipulation  X2?  . Cardiac catheterization  "several"  . Coronary angioplasty with stent placement  "several"  . Eye surgery Bilateral     "laser OR for hemorrhage"  . Av fistula placement Left ~ 2007    "upper arm"    Allergies  Allergen Reactions  . Enalapril Hives    I have reviewed the patient's current medications . amitriptyline  100 mg Oral QHS  . amLODipine  10 mg Oral Daily  . aspirin  81 mg Oral Daily  . atorvastatin  20 mg Oral QHS  . carvedilol  50 mg Oral BID  . cinacalcet  90 mg Oral QHS  . clopidogrel  75 mg Oral Daily  . heparin  5,000 Units Subcutaneous 3 times per day  . isosorbide mononitrate  30 mg Oral Daily  . multivitamin  1 tablet Oral QHS  . pantoprazole  40 mg Oral Daily  . pregabalin  50 mg Oral Daily  . sevelamer carbonate  2,400 mg Oral TID WC  . sodium chloride  3 mL Intravenous Q12H  . venlafaxine XR  75 mg Oral Q breakfast     sodium chloride, sodium chloride, alteplase, heparin, heparin, HYDROcodone-acetaminophen, ipratropium-albuterol, lidocaine (PF), lidocaine-prilocaine, meclizine, pentafluoroprop-tetrafluoroeth  Prior to Admission medications   Medication Sig Start Date End Date Taking? Authorizing Provider  acetaminophen (TYLENOL) 500 MG tablet Take 500 mg by mouth every 6 (six) hours as needed for headache.    Yes Historical Provider, MD  amitriptyline (ELAVIL) 100 MG tablet Take 1 tablet (100 mg total) by mouth at bedtime. 07/27/15  Yes Shela Leff, MD    amLODipine (NORVASC) 10 MG tablet Take 10 mg by mouth daily.   Yes Historical Provider, MD  aspirin 81 MG chewable tablet Chew 1 tablet (81 mg total) by mouth daily. 07/22/15  Yes Iline Oven, MD  atorvastatin (LIPITOR) 20 MG tablet Take 20 mg by mouth at bedtime.    Yes Historical Provider, MD  carvedilol (COREG) 25 MG tablet Take 50 mg by mouth 2 (two) times daily.    Yes Historical Provider, MD  cinacalcet (SENSIPAR) 90 MG tablet Take 90 mg by mouth at bedtime.    Yes Historical Provider, MD  clopidogrel (PLAVIX) 75 MG tablet Take 1 tablet (75 mg total) by mouth daily. 11/24/14  Yes Juluis Mire, MD  Darbepoetin Alfa (ARANESP) 25 MCG/0.42ML SOSY injection Inject 0.42 mLs (25 mcg total) into the vein every Tuesday with hemodialysis. 12/15/14  Yes Geradine Girt, DO  doxercalciferol (HECTOROL) 4 MCG/2ML injection Inject 1 mL (2 mcg total) into the vein Every Tuesday,Thursday,and Saturday with dialysis. 12/15/14  Yes Geradine Girt, DO  ferric gluconate 62.5 mg in sodium chloride 0.9 % 100 mL Inject 62.5 mg into the vein every Thursday with hemodialysis. 12/16/14  Yes Geradine Girt, DO  Ipratropium-Albuterol (COMBIVENT RESPIMAT) 20-100 MCG/ACT AERS respimat Inhale 2 puffs into the lungs every 6 (six) hours as needed for wheezing.   Yes Historical Provider, MD  isosorbide mononitrate (IMDUR) 30 MG 24 hr tablet Take 1 tablet (30 mg total) by mouth daily. 07/27/15  Yes Shela Leff, MD  lidocaine (LIDODERM) 5 % Place 1 patch onto the skin daily. Remove & Discard patch within 12 hours or as directed by MD 06/05/15  Yes Janece Canterbury, MD  meclizine (ANTIVERT) 25 MG tablet Take 25 mg by mouth 3 (three) times daily as needed for dizziness.   Yes Historical Provider, MD  multivitamin (RENA-VIT) TABS tablet Take 1 tablet by mouth at bedtime. 12/15/14  Yes Jessica U Vann, DO  pantoprazole (PROTONIX) 40 MG tablet Take 1 tablet (40 mg total) by mouth daily. 06/05/15  Yes Janece Canterbury, MD  pregabalin  (LYRICA) 50 MG capsule Take 1 capsule (50 mg total) by mouth daily. 12/15/14  Yes Geradine Girt, DO  sevelamer carbonate (RENVELA) 800 MG tablet Take 2,400 mg by mouth 3 (three) times daily with meals.    Yes Historical Provider, MD  venlafaxine XR (EFFEXOR XR) 75 MG 24 hr capsule Take 1 capsule (75 mg total) by mouth daily with breakfast. 07/27/15  Yes Shela Leff, MD  nitroGLYCERIN (NITROSTAT) 0.4 MG SL tablet Place 1 tablet (0.4 mg total) under the tongue every 5 (five) minutes as needed for chest pain. 06/05/15   Janece Canterbury, MD     Social History   Social History  . Marital Status: Divorced    Spouse Name: N/A  . Number of Children: N/A  . Years of Education: N/A   Occupational History  . Not on file.   Social History Main Topics  . Smoking status: Former Smoker -- 0.50 packs/day for 8 years    Types: Cigarettes    Quit date: 12/06/2010  . Smokeless tobacco: Never Used  . Alcohol Use: No  . Drug Use: No  . Sexual Activity: Not Currently   Other Topics Concern  . Not on file   Social History Narrative   Moved from [place], [place] to [place] in 11/2014.    Family Status  Relation Status Death Age  . Mother Deceased   . Father Deceased    Family History  Problem Relation Age of Onset  . Hypertension    . Bone cancer Mother   . Anuerysm Father   . Diabetes type II Daughter        ROS:  Full 14 point review of systems complete and found to be negative unless listed above.  Physical Exam: Blood pressure 129/78, pulse 86, temperature 98 F (36.7 C), temperature source Oral, resp. rate 11, height 6' (1.829 m), weight 203 lb 3.2 oz (92.171 kg), SpO2 95 %.  General: Well developed, well nourished, male in no acute distress Head: Eyes PERRLA, No xanthomas. Normocephalic and atraumatic, oropharynx without edema or exudate.  Lungs: Resp regular and unlabored, CTA. Heart: RRR no s3, s4, or murmurs..   Neck: No carotid bruits. No lymphadenopathy.   JVD. Abdomen: Bowel sounds present, abdomen soft and non-tender without masses or hernias noted. Msk:  No spine or cva tenderness. No weakness, no joint deformities or effusions. Extremities: No clubbing, cyanosis or edema. DP/PT/Radials 2+ and equal  bilaterally. Neuro: Alert and oriented X 3. No focal deficits noted. Psych:  Good affect, responds appropriately Skin: No rashes or lesions noted.  Labs:   Lab Results  Component Value Date   WBC 4.9 08/29/2015   HGB 13.6 08/29/2015   HCT 41.2 08/29/2015   MCV 83.1 08/29/2015   PLT 99* 08/29/2015   No results for input(s): INR in the last 72 hours.  Recent Labs Lab 08/29/15 0044  NA 135  K 4.8  CL 92*  CO2 29  BUN 39*  CREATININE 11.42*  CALCIUM 8.7*  GLUCOSE 120*  ALBUMIN 3.3*   MAGNESIUM  Date Value Ref Range Status  07/21/2015 2.5* 1.7 - 2.4 mg/dL Final    Recent Labs  08/28/15 1858 08/29/15 0044 08/29/15 0620  TROPONINI 0.03 0.03 <0.03    Recent Labs  08/28/15 1334 08/28/15 1632  TROPIPOC 0.02 0.02   No results found for: PROBNP Lab Results  Component Value Date   CHOL 86 04/01/2015   HDL 22* 04/01/2015   LDLCALC 48 04/01/2015   TRIG 79 04/01/2015    Echo: 05/18/2015 LV EF: 45% -  50%  ------------------------------------------------------------------- Indications:   Chest pain 786.51.  ------------------------------------------------------------------- History:  PMH:  Coronary artery disease. Congestive heart failure. Risk factors: ESRD. Hypertension. Diabetes mellitus.  ------------------------------------------------------------------- Study Conclusions  - Left ventricle: The cavity size was mildly dilated. Wall thickness was normal. Systolic function was mildly reduced. The estimated ejection fraction was in the range of 45% to 50%. Mild diffuse hypokinesis with no identifiable regional variations. Features are consistent with a pseudonormal left ventricular filling  pattern, with concomitant abnormal relaxation and increased filling pressure (grade 2 diastolic dysfunction). - Aortic valve: There was trivial regurgitation. - Mitral valve: Moderately calcified annulus. There was moderate regurgitation directed centrally. Valve area by pressure half-time: 2.44 cm^2. Valve area by continuity equation (using LVOT flow): 1.39 cm^2. - Left atrium: The atrium was severely dilated. - Right ventricle: The cavity size was moderately dilated. Systolic function was mildly reduced. - Right atrium: The atrium was mildly dilated.  Impressions:  - Direct image comparison with the study from February 2016 confirms a slight reduction in LV systolic function and worsening secondary mitral insufficiency, as well as elevation in mean left atrial pressure.  ECG:   Vent. rate 87 BPM PR interval 152 ms QRS duration 108 ms QT/QTc 410/493 ms P-R-T axes 55 67 79  Myoview 05/19/2015     Study Result      There was no ST segment deviation noted during stress.  Defect 1: There is a medium defect of moderate severity.  Findings consistent with prior myocardial infarction.  This is an intermediate risk study.  Nuclear stress EF: 42%.  Intermediate risk study. There moderate sized and intensity fixed, mid to apical inferolateral wall defect consistent with scar. No significant reversible ischemia is noted. LVEF 42% with inferolateral hypokinesis.     Radiology:  Dg Chest 2 View  08/28/2015  CLINICAL DATA:  Chest pain and hurts to breathe. EXAM: CHEST  2 VIEW COMPARISON:  07/21/2015 FINDINGS: Lung markings are mildly prominent but no evidence for focal airspace disease or pulmonary edema. Heart and mediastinum are within normal limits. No acute bone abnormality. Negative for pleural effusions. Evidence for coronary artery calcifications. IMPRESSION: No active cardiopulmonary disease. Electronically Signed   By: Markus Daft M.D.   On: 08/28/2015  14:16   Ct Angio Chest Pe W/cm &/or Wo Cm  08/28/2015  CLINICAL DATA:  Pleuritic chest pain.  EXAM: CT ANGIOGRAPHY CHEST WITH CONTRAST TECHNIQUE: Multidetector CT imaging of the chest was performed using the standard protocol during bolus administration of intravenous contrast. Multiplanar CT image reconstructions and MIPs were obtained to evaluate the vascular anatomy. CONTRAST:  53mL OMNIPAQUE IOHEXOL 350 MG/ML SOLN COMPARISON:  05/17/2015 chest CT angiogram. Chest radiograph from earlier today. FINDINGS: Mediastinum/Nodes: The study is high quality for the evaluation of pulmonary embolism. There are no filling defects in the central, lobar, segmental or subsegmental pulmonary artery branches to suggest acute pulmonary embolism. Atherosclerotic nonaneurysmal thoracic aorta. Normal caliber pulmonary arteries. Normal heart size. No pericardial fluid/thickening. Left anterior descending, left circumflex and right coronary atherosclerosis. Normal visualized thyroid. Normal esophagus. No pathologically enlarged axillary, mediastinal or hilar lymph nodes. Lungs/Pleura: No pneumothorax. No pleural effusion. No acute consolidative airspace disease, significant pulmonary nodules or lung masses. There is a stable mosaic attenuation throughout both lungs. Upper abdomen: There is perinephric fat stranding surrounding the visualized upper left kidney, nonspecific, unchanged. Musculoskeletal: No aggressive appearing focal osseous lesions. Stable T8 vertebral hemangioma. Mild degenerative changes in the thoracic spine. Review of the MIP images confirms the above findings. IMPRESSION: 1. No evidence of pulmonary embolism. 2. Mosaic attenuation throughout both lungs, unchanged, a nonspecific finding most commonly secondary to air trapping/ small airway disease, although the differential includes pulmonary vascular disease. 3. Three-vessel coronary atherosclerosis. Electronically Signed   By: Ilona Sorrel M.D.   On: 08/28/2015  16:02    ASSESSMENT AND PLAN:     1. Atypical chest pain - Recurrent chest pain during dialysis, however improved since last admission per patient. EKG showed sinus tachycardia with voltage criteria for LVH. Repeat EKG this morning showed sinus rhythm at rate of 87bpm with J point elevated in inferior lead. CXR clear. CTA without PE however showed three vessel coronary atherosclerosis and possible pulmonary vascular disease.  - Chest chest worsen with movement, consistent with MSK. - Will discuss plan with MD. Likely follow up as outpatient.   2. Chronic combined systolic and diastolic CHF - Aappers euvolumic. Make no urine. Volume managed by HF.  - Last echo 05/18/2015 showed LV EF of 45-50%, grade 2 DD - Direct image comparison with the study from February 2016confirms a slight reduction in LV systolic function and worsening secondary mitral insufficiency, as well as elevation in mean left atrial pressure.  3. CAD  - He is actually had a recent cardiac catheterization 11/2014 showing patent stents in the LAD. Initially had a bare-metal stent placed in the mid LAD that was noted to have in-stent restenosis about a year later in March 2012 and that was treated with Xience struggling stent. All done at [facility]. - Continue asa, plavix, coreg, imdur and statin.   4. Moderate MR - per last echo as above. Mitral valve: Moderately calcified annulus. There was moderate regurgitation directed centrally. Valve area by pressure half-time: 2.44 cm^2. Valve area by continuity equation (using LVOT flow): 1.39 cm^2.  5.  ESRD (end stage renal disease) ([place]) - HD T, Th and Saturday - needs to control chest pain so he can complete HD - Dr. Ellyn Hack mention during last admission "Ensure that he has full dialysis if that means we need to hold off on blood pressure medications on dialysis days to allow for full dialysis then medications need to be held. Could consider adding Renaxa".   Otherwise per IM:    Hypertension   Diabetes mellitus type 2, insulin dependent (HCC)   Diastolic dysfunction-grade 2   GERD (gastroesophageal  reflux disease)   Diabetic neuropathy ([place])   SignedLeanor Kail, PA 08/29/2015, 7:41 AM Pager [phone]  Co-Sign MD

## 2015-08-29 NOTE — Plan of Care (Signed)
Problem: Pain Managment: Goal: General experience of comfort will improve Outcome: Completed/Met Date Met:  08/29/15 Pt educated on pain scale and interventions. Pt verbalized understanding and has no complaints at this time.

## 2015-08-29 NOTE — Discharge Summary (Signed)
Name: [name] [name] MRN: [id number] DOB: 1956-03-08 59 y.o. PCP: [name]  Date of Admission: 08/28/2015  1:10 PM Date of Discharge: 08/29/2015 Attending Physician: Axel Filler, MD  Discharge Diagnosis: Principal Problem:   Atypical chest pain Active Problems:   ESRD (end stage renal disease) ([place])   Hypertension   CAD -S/P LAD BMS 2011, LAD DES 2012- patent cors Feb 2016   Diabetes mellitus type 2, insulin dependent ([place])   Diastolic dysfunction-grade 2   GERD (gastroesophageal reflux disease)   Diabetic neuropathy ([place])  Discharge Medications:   Medication List    TAKE these medications        acetaminophen 500 MG tablet  Commonly known as:  TYLENOL  Take 500 mg by mouth every 6 (six) hours as needed for headache.     amitriptyline 100 MG tablet  Commonly known as:  ELAVIL  Take 1 tablet (100 mg total) by mouth at bedtime.     amLODipine 10 MG tablet  Commonly known as:  NORVASC  Take 10 mg by mouth daily.     aspirin 81 MG chewable tablet  Chew 1 tablet (81 mg total) by mouth daily.     atorvastatin 20 MG tablet  Commonly known as:  LIPITOR  Take 20 mg by mouth at bedtime.     carvedilol 25 MG tablet  Commonly known as:  COREG  Take 50 mg by mouth 2 (two) times daily.     cinacalcet 90 MG tablet  Commonly known as:  SENSIPAR  Take 90 mg by mouth at bedtime.     clopidogrel 75 MG tablet  Commonly known as:  PLAVIX  Take 1 tablet (75 mg total) by mouth daily.     COMBIVENT RESPIMAT 20-100 MCG/ACT Aers respimat  Generic drug:  Ipratropium-Albuterol  Inhale 2 puffs into the lungs every 6 (six) hours as needed for wheezing.     Darbepoetin Alfa 25 MCG/0.42ML Sosy injection  Commonly known as:  ARANESP  Inject 0.42 mLs (25 mcg total) into the vein every Tuesday with hemodialysis.     doxercalciferol 4 MCG/2ML injection  Commonly known as:  HECTOROL  Inject 1 mL (2 mcg total) into the vein Every Tuesday,Thursday,and Saturday with  dialysis.     ferric gluconate 62.5 mg in sodium chloride 0.9 % 100 mL  Inject 62.5 mg into the vein every Thursday with hemodialysis.     isosorbide mononitrate 30 MG 24 hr tablet  Commonly known as:  IMDUR  Take 1 tablet (30 mg total) by mouth daily.     lidocaine 5 %  Commonly known as:  LIDODERM  Place 1 patch onto the skin daily. Remove & Discard patch within 12 hours or as directed by MD     meclizine 25 MG tablet  Commonly known as:  ANTIVERT  Take 25 mg by mouth 3 (three) times daily as needed for dizziness.     multivitamin Tabs tablet  Take 1 tablet by mouth at bedtime.     nitroGLYCERIN 0.4 MG SL tablet  Commonly known as:  NITROSTAT  Place 1 tablet (0.4 mg total) under the tongue every 5 (five) minutes as needed for chest pain.     pantoprazole 40 MG tablet  Commonly known as:  PROTONIX  Take 1 tablet (40 mg total) by mouth daily.     pregabalin 50 MG capsule  Commonly known as:  LYRICA  Take 1 capsule (50 mg total) by mouth daily.  sevelamer carbonate 800 MG tablet  Commonly known as:  RENVELA  Take 2,400 mg by mouth 3 (three) times daily with meals.     simethicone 80 MG chewable tablet  Commonly known as:  GAS-X  Chew 1 tablet (80 mg total) by mouth every 6 (six) hours as needed for flatulence.     venlafaxine XR 75 MG 24 hr capsule  Commonly known as:  EFFEXOR XR  Take 1 capsule (75 mg total) by mouth daily with breakfast.        Disposition and follow-up:   [name] [name] was discharged from [facility] in Good condition.  At the hospital follow up visit please address:  1.  Heart failure management: Patient admitted 8 times this year for chest pain. He has not been following up with outpatient Cardiology and may have sub-optimal management with medications being addressed during each admission. This recent episode was most likely related to GI or musculoskeletal pain, and was different from his experience cramping on  HD.  Follow-up Appointments:     Follow-up Information    Follow up with Truitt Merle, NP. Go on 09/30/2015.   Specialties:  Nurse Practitioner, Interventional Cardiology, Cardiology, Radiology   Why:  @9 :30am for hospital f/u   Contact information:   [place] [place] [address] [phone]       Discharge Instructions:   Consultations: Treatment Team:  R[facility], MD Donato Heinz, MD  Procedures Performed:  Dg Chest 2 View  08/28/2015  CLINICAL DATA:  Chest pain and hurts to breathe. EXAM: CHEST  2 VIEW COMPARISON:  07/21/2015 FINDINGS: Lung markings are mildly prominent but no evidence for focal airspace disease or pulmonary edema. Heart and mediastinum are within normal limits. No acute bone abnormality. Negative for pleural effusions. Evidence for coronary artery calcifications. IMPRESSION: No active cardiopulmonary disease. Electronically Signed   By: Markus Daft M.D.   On: 08/28/2015 14:16   Ct Angio Chest Pe W/cm &/or Wo Cm  08/28/2015  CLINICAL DATA:  Pleuritic chest pain. EXAM: CT ANGIOGRAPHY CHEST WITH CONTRAST TECHNIQUE: Multidetector CT imaging of the chest was performed using the standard protocol during bolus administration of intravenous contrast. Multiplanar CT image reconstructions and MIPs were obtained to evaluate the vascular anatomy. CONTRAST:  30mL OMNIPAQUE IOHEXOL 350 MG/ML SOLN COMPARISON:  05/17/2015 chest CT angiogram. Chest radiograph from earlier today. FINDINGS: Mediastinum/Nodes: The study is high quality for the evaluation of pulmonary embolism. There are no filling defects in the central, lobar, segmental or subsegmental pulmonary artery branches to suggest acute pulmonary embolism. Atherosclerotic nonaneurysmal thoracic aorta. Normal caliber pulmonary arteries. Normal heart size. No pericardial fluid/thickening. Left anterior descending, left circumflex and right coronary atherosclerosis. Normal visualized  thyroid. Normal esophagus. No pathologically enlarged axillary, mediastinal or hilar lymph nodes. Lungs/Pleura: No pneumothorax. No pleural effusion. No acute consolidative airspace disease, significant pulmonary nodules or lung masses. There is a stable mosaic attenuation throughout both lungs. Upper abdomen: There is perinephric fat stranding surrounding the visualized upper left kidney, nonspecific, unchanged. Musculoskeletal: No aggressive appearing focal osseous lesions. Stable T8 vertebral hemangioma. Mild degenerative changes in the thoracic spine. Review of the MIP images confirms the above findings. IMPRESSION: 1. No evidence of pulmonary embolism. 2. Mosaic attenuation throughout both lungs, unchanged, a nonspecific finding most commonly secondary to air trapping/ small airway disease, although the differential includes pulmonary vascular disease. 3. Three-vessel coronary atherosclerosis. Electronically Signed   By: Ilona Sorrel M.D.   On: 08/28/2015 16:02  Admission HPI: 59 y/o with PMHx of ESRD on HD TuThSa, T2DM, CAD s/p stenting in 2011 and 2012 presents with chest pain lasting approximately the past 5 days. This pain started during hemodialysis last week and he was requiring sessions to end 10 minutes early due to cramping and pain. Yesterday the pain did not improve later in the day after HD and by this morning he came to the hospital for evaluation. It is painful on his anterior chest bilaterally, sharp in character, and worsens on deep inspiration. He feels this pain is similar to when he retained too much fluid in the past. He has tried tylenol at home with little improvement. He is not feeling weaker than usual or short of breath. He is not diaphoretic He denies nausea or diarrhea, but states he does vomit food regularly which is longstanding.  Of note this is Mr. Mention 8th hospital admission this year, the majority of which are related to recurrence of chest pain. He underwent heat  catheterization in 11/2014 with all stents found to be patent >80%, with Myoview repeated in 05/2015. He also has had variation in his weights that may be contributing to difficulty with his HD regimen.  Hospital Course by problem list: Atypical chest pain, ACS rule out Patient admitted to rule out acute coronary syndrome in setting of his severe chronic ischemic heart disease. Presenting K of 5.5 decreased to 4.8 overnight after 30g kayexalate PO. Troponins trended x3 all negative <0.04. EKG on admission and repeat showed sinus tach with LVH, a consistent finding on past EKGs. By hospital day 1 his chest pain was largely improved, he attributes some relief following burping and passing flatus. Given his recent workup, symptomatic improvement, and highly atypical pain for ischemia no further intervention or imaging were pursued during hospital course.  Discharge Vitals:   BP 142/88 mmHg  Pulse 86  Temp(Src) 98 F (36.7 C) (Oral)  Resp 11  Ht 6' (1.829 m)  Wt 92.171 kg (203 lb 3.2 oz)  BMI 27.55 kg/m2  SpO2 95%  Discharge Labs:  Results for orders placed or performed during the hospital encounter of 08/28/15 (from the past 24 hour(s))  Basic metabolic panel     Status: Abnormal   Collection Time: 08/28/15  1:27 PM  Result Value Ref Range   Sodium 134 (L) 135 - 145 mmol/L   Potassium 5.5 (H) 3.5 - 5.1 mmol/L   Chloride 91 (L) 101 - 111 mmol/L   CO2 28 22 - 32 mmol/L   Glucose, Bld 147 (H) 65 - 99 mg/dL   BUN 32 (H) 6 - 20 mg/dL   Creatinine, Ser 10.24 (H) 0.61 - 1.24 mg/dL   Calcium 9.6 8.9 - 10.3 mg/dL   GFR calc non Af Amer 5 (L) >60 mL/min   GFR calc Af Amer 6 (L) >60 mL/min   Anion gap 15 5 - 15  CBC     Status: Abnormal   Collection Time: 08/28/15  1:27 PM  Result Value Ref Range   WBC 5.8 4.0 - 10.5 K/uL   RBC 5.36 4.22 - 5.81 MIL/uL   Hemoglobin 14.7 13.0 - 17.0 g/dL   HCT 45.4 39.0 - 52.0 %   MCV 84.7 78.0 - 100.0 fL   MCH 27.4 26.0 - 34.0 pg   MCHC 32.4 30.0 - 36.0 g/dL    RDW 17.2 (H) 11.5 - 15.5 %   Platelets 157 150 - 400 K/uL  I-stat troponin, ED (not at [facility], [facility])  Status: None   Collection Time: 08/28/15  1:34 PM  Result Value Ref Range   Troponin i, poc 0.02 0.00 - 0.08 ng/mL   Comment 3          I-stat troponin, ED     Status: None   Collection Time: 08/28/15  4:32 PM  Result Value Ref Range   Troponin i, poc 0.02 0.00 - 0.08 ng/mL   Comment 3          Troponin I (q 6hr x 3)     Status: None   Collection Time: 08/28/15  6:58 PM  Result Value Ref Range   Troponin I 0.03 <0.031 ng/mL  CBC     Status: Abnormal   Collection Time: 08/29/15 12:44 AM  Result Value Ref Range   WBC 4.9 4.0 - 10.5 K/uL   RBC 4.96 4.22 - 5.81 MIL/uL   Hemoglobin 13.6 13.0 - 17.0 g/dL   HCT 41.2 39.0 - 52.0 %   MCV 83.1 78.0 - 100.0 fL   MCH 27.4 26.0 - 34.0 pg   MCHC 33.0 30.0 - 36.0 g/dL   RDW 17.3 (H) 11.5 - 15.5 %   Platelets 99 (L) 150 - 400 K/uL  Renal function panel     Status: Abnormal   Collection Time: 08/29/15 12:44 AM  Result Value Ref Range   Sodium 135 135 - 145 mmol/L   Potassium 4.8 3.5 - 5.1 mmol/L   Chloride 92 (L) 101 - 111 mmol/L   CO2 29 22 - 32 mmol/L   Glucose, Bld 120 (H) 65 - 99 mg/dL   BUN 39 (H) 6 - 20 mg/dL   Creatinine, Ser 11.42 (H) 0.61 - 1.24 mg/dL   Calcium 8.7 (L) 8.9 - 10.3 mg/dL   Phosphorus 9.8 (H) 2.5 - 4.6 mg/dL   Albumin 3.3 (L) 3.5 - 5.0 g/dL   GFR calc non Af Amer 4 (L) >60 mL/min   GFR calc Af Amer 5 (L) >60 mL/min   Anion gap 14 5 - 15  Troponin I (q 6hr x 3)     Status: None   Collection Time: 08/29/15 12:44 AM  Result Value Ref Range   Troponin I 0.03 <0.031 ng/mL  Troponin I (q 6hr x 3)     Status: None   Collection Time: 08/29/15  6:20 AM  Result Value Ref Range   Troponin I <0.03 <0.031 ng/mL    Signed: Collier Salina, MD 08/29/2015, 1:23 PM

## 2015-08-29 NOTE — Plan of Care (Signed)
Problem: Fluid Volume: Goal: Ability to maintain a balanced intake and output will improve Outcome: Completed/Met Date Met:  08/29/15 Pt educated on intake and output balance. Pt educated on fluid restriction. Pt verbalized understanding.

## 2015-08-29 NOTE — Plan of Care (Signed)
Problem: Health Behavior/Discharge Planning: Goal: Ability to manage health-related needs will improve Outcome: Completed/Met Date Met:  08/29/15 Discharge needs been assessed. Pt receiving bus pass from SW. No other needs at this time.

## 2015-08-29 NOTE — Plan of Care (Signed)
Problem: Tissue Perfusion: Goal: Risk factors for ineffective tissue perfusion will decrease Outcome: Completed/Met Date Met:  08/29/15 Pt educated on VTE prophylaxis. Pt verbalized understanding.

## 2015-08-29 NOTE — Plan of Care (Signed)
Problem: Education: Goal: Knowledge of [place] [facility] Education information/materials will improve Outcome: Completed/Met Date Met:  08/29/15 Pt educated on medication compliance, pain scale, discharge needs, and plan of care. Pt verbalized understanding.

## 2015-08-29 NOTE — Discharge Instructions (Addendum)
Your chest pain was most likely from the muscles and bones of the chest wall, or radiating from your stomach area. Your EKG, and blood work all demonstrated no new event at your heart that requires intervention. It is important however that you attend your follow up Cardiology appointment.  You also complained of intermittently painful gas. We will prescribe Simethicone that you can take after meals up to 3 times daily for relief of gas discomfort.

## 2015-08-29 NOTE — Progress Notes (Signed)
Subjective: No acute events overnight. His pain improved overnight after burping and passing flatus. He did not become short of breath, diaphoretic, or lightheaded. He now thinks this feels similar to bad gas pain than previous ischemic events.  Objective: Vital signs in last 24 hours: Filed Vitals:   08/28/15 1838 08/28/15 2110 08/29/15 0500 08/29/15 0923  BP: 182/101 165/92 129/78 142/88  Pulse: 95  86   Temp: 98.4 F (36.9 C) 97.7 F (36.5 C) 98 F (36.7 C)   TempSrc: Oral Oral    Resp: 18 19 11    Height: 6' (1.829 m)     Weight:   92.171 kg (203 lb 3.2 oz)   SpO2: 100% 98% 95%    Weight change:   Intake/Output Summary (Last 24 hours) at 08/29/15 1018 Last data filed at 08/29/15 0945  Gross per 24 hour  Intake    480 ml  Output      0 ml  Net    480 ml   GENERAL- alert, co-operative, NAD CARDIAC- RRR, 3/6 holosystolic murmur audible loudest LUSB RESP- CTAB, no wheezes or crackles. ABDOMEN- Soft, nontender, no guarding or rebound, normoactive bowel sounds present NEURO- No obvious Cr N abnormality, strength upper and lower extremities- 5/5 EXTREMITIES- LUE AVG with thrill, symmetric, no pedal edema. SKIN- Warm, dry, numerous tattoos throughout torso and arms PSYCH- Normal mood and affect, appropriate thought content and speech.  Lab Results: Basic Metabolic Panel:  Recent Labs Lab 08/28/15 1327 08/29/15 0044  NA 134* 135  K 5.5* 4.8  CL 91* 92*  CO2 28 29  GLUCOSE 147* 120*  BUN 32* 39*  CREATININE 10.24* 11.42*  CALCIUM 9.6 8.7*  PHOS  --  9.8*   Liver Function Tests:  Recent Labs Lab 08/29/15 0044  ALBUMIN 3.3*     Recent Labs Lab 08/28/15 1327 08/29/15 0044  WBC 5.8 4.9  HGB 14.7 13.6  HCT 45.4 41.2  MCV 84.7 83.1  PLT 157 99*   Cardiac Enzymes:  Recent Labs Lab 08/28/15 1858 08/29/15 0044 08/29/15 0620  TROPONINI 0.03 0.03 <0.03   Urine Drug Screen: Drugs of Abuse     Component Value Date/Time   LABOPIA NONE DETECTED  12/13/2014 0302   COCAINSCRNUR NONE DETECTED 12/13/2014 0302   LABBENZ NONE DETECTED 12/13/2014 0302   AMPHETMU NONE DETECTED 12/13/2014 0302   THCU NONE DETECTED 12/13/2014 0302   LABBARB NONE DETECTED 12/13/2014 0302     Micro Results: No results found for this or any previous visit (from the past 240 hour(s)). Studies/Results: Dg Chest 2 View  08/28/2015  CLINICAL DATA:  Chest pain and hurts to breathe. EXAM: CHEST  2 VIEW COMPARISON:  07/21/2015 FINDINGS: Lung markings are mildly prominent but no evidence for focal airspace disease or pulmonary edema. Heart and mediastinum are within normal limits. No acute bone abnormality. Negative for pleural effusions. Evidence for coronary artery calcifications. IMPRESSION: No active cardiopulmonary disease. Electronically Signed   By: Markus Daft M.D.   On: 08/28/2015 14:16   Ct Angio Chest Pe W/cm &/or Wo Cm  08/28/2015  CLINICAL DATA:  Pleuritic chest pain. EXAM: CT ANGIOGRAPHY CHEST WITH CONTRAST TECHNIQUE: Multidetector CT imaging of the chest was performed using the standard protocol during bolus administration of intravenous contrast. Multiplanar CT image reconstructions and MIPs were obtained to evaluate the vascular anatomy. CONTRAST:  59mL OMNIPAQUE IOHEXOL 350 MG/ML SOLN COMPARISON:  05/17/2015 chest CT angiogram. Chest radiograph from earlier today. FINDINGS: Mediastinum/Nodes: The study is high quality for  the evaluation of pulmonary embolism. There are no filling defects in the central, lobar, segmental or subsegmental pulmonary artery branches to suggest acute pulmonary embolism. Atherosclerotic nonaneurysmal thoracic aorta. Normal caliber pulmonary arteries. Normal heart size. No pericardial fluid/thickening. Left anterior descending, left circumflex and right coronary atherosclerosis. Normal visualized thyroid. Normal esophagus. No pathologically enlarged axillary, mediastinal or hilar lymph nodes. Lungs/Pleura: No pneumothorax. No pleural  effusion. No acute consolidative airspace disease, significant pulmonary nodules or lung masses. There is a stable mosaic attenuation throughout both lungs. Upper abdomen: There is perinephric fat stranding surrounding the visualized upper left kidney, nonspecific, unchanged. Musculoskeletal: No aggressive appearing focal osseous lesions. Stable T8 vertebral hemangioma. Mild degenerative changes in the thoracic spine. Review of the MIP images confirms the above findings. IMPRESSION: 1. No evidence of pulmonary embolism. 2. Mosaic attenuation throughout both lungs, unchanged, a nonspecific finding most commonly secondary to air trapping/ small airway disease, although the differential includes pulmonary vascular disease. 3. Three-vessel coronary atherosclerosis. Electronically Signed   By: Ilona Sorrel M.D.   On: 08/28/2015 16:02   Medications: I have reviewed the patient's current medications. Scheduled Meds: . amitriptyline  100 mg Oral QHS  . amLODipine  10 mg Oral Daily  . aspirin  81 mg Oral Daily  . atorvastatin  20 mg Oral QHS  . carvedilol  50 mg Oral BID  . cinacalcet  90 mg Oral QHS  . clopidogrel  75 mg Oral Daily  . heparin  5,000 Units Subcutaneous 3 times per day  . isosorbide mononitrate  30 mg Oral Daily  . multivitamin  1 tablet Oral QHS  . pantoprazole  40 mg Oral Daily  . pregabalin  50 mg Oral Daily  . sevelamer carbonate  2,400 mg Oral TID WC  . sodium chloride  3 mL Intravenous Q12H  . venlafaxine XR  75 mg Oral Q breakfast   Continuous Infusions:  PRN Meds:.sodium chloride, sodium chloride, alteplase, heparin, heparin, HYDROcodone-acetaminophen, ipratropium-albuterol, lidocaine (PF), lidocaine-prilocaine, meclizine, pentafluoroprop-tetrafluoroeth Assessment/Plan: Atypical chest pain: ACS process ruled out according to repeat EKG, negative troponins, symptomatic improvement of the chest pain. At this point all data suggest no ischemia. Pattern of pain most consistent with  MSK. He also reports symptoms got better with burping and passing flatus. Discussed with Dr. Tamala Julian, who states will refer patient to cardiology clinic follow up of his CAD and heart failure. -Ambulate today, likely discharge if tolerating exercise  Gas, bloating: Will prescribe simethicone at discharge  ESRD on HD: K improved to 4.8 from 5.5 with 30g Kayexalate. Plan is to discharge home and go to regular HD session on Tuesday. Grade 2 Diastolic dysfunction: amlodipine 10mg  PO, Atorvastatin 20mg  qhs, Coreg 50mg  tablet, Imdur 30mg  CAD s/p stenting: ASA 81mg , Plavix 75mg  GERD: Protonix 40mg  PO daily  Diet: Carb modified DVT ppx: [place] heparin FULL CODE  Dispo: Disposition is deferred at this time, awaiting improvement of current medical problems. Anticipated discharge to home later today.  The patient does not have a current PCP ([name]) and does not know need an [facility] hospital follow-up appointment after discharge.  The patient does not know have transportation limitations that hinder transportation to clinic appointments.    Collier Salina, MD 08/29/2015, 10:18 AM

## 2015-08-30 ENCOUNTER — Ambulatory Visit: Payer: Medicare Other | Admitting: Podiatry

## 2015-08-30 NOTE — Addendum Note (Signed)
Addended by: Hulan Fray on: 08/30/2015 04:54 PM   Modules accepted: Orders

## 2015-09-06 ENCOUNTER — Ambulatory Visit: Payer: Medicare Other | Admitting: Internal Medicine

## 2015-09-06 ENCOUNTER — Encounter: Payer: Self-pay | Admitting: General Practice

## 2015-09-18 ENCOUNTER — Emergency Department (HOSPITAL_COMMUNITY)
Admission: EM | Admit: 2015-09-18 | Discharge: 2015-09-19 | Disposition: A | Discharge status: Home / Self Care | Payer: Medicare Other | Attending: Emergency Medicine | Admitting: Emergency Medicine

## 2015-09-18 ENCOUNTER — Emergency Department (HOSPITAL_COMMUNITY): Payer: Medicare Other

## 2015-09-18 ENCOUNTER — Encounter (HOSPITAL_COMMUNITY): Payer: Self-pay | Admitting: Emergency Medicine

## 2015-09-18 DIAGNOSIS — Z87891 Personal history of nicotine dependence: Secondary | ICD-10-CM | POA: Insufficient documentation

## 2015-09-18 DIAGNOSIS — I120 Hypertensive chronic kidney disease with stage 5 chronic kidney disease or end stage renal disease: Secondary | ICD-10-CM | POA: Insufficient documentation

## 2015-09-18 DIAGNOSIS — Z86010 Personal history of colonic polyps: Secondary | ICD-10-CM | POA: Insufficient documentation

## 2015-09-18 DIAGNOSIS — Z7902 Long term (current) use of antithrombotics/antiplatelets: Secondary | ICD-10-CM | POA: Insufficient documentation

## 2015-09-18 DIAGNOSIS — D649 Anemia, unspecified: Secondary | ICD-10-CM | POA: Diagnosis not present

## 2015-09-18 DIAGNOSIS — I25119 Atherosclerotic heart disease of native coronary artery with unspecified angina pectoris: Secondary | ICD-10-CM | POA: Diagnosis not present

## 2015-09-18 DIAGNOSIS — R011 Cardiac murmur, unspecified: Secondary | ICD-10-CM | POA: Insufficient documentation

## 2015-09-18 DIAGNOSIS — Z9861 Coronary angioplasty status: Secondary | ICD-10-CM | POA: Diagnosis not present

## 2015-09-18 DIAGNOSIS — I5032 Chronic diastolic (congestive) heart failure: Secondary | ICD-10-CM | POA: Insufficient documentation

## 2015-09-18 DIAGNOSIS — Z9889 Other specified postprocedural states: Secondary | ICD-10-CM | POA: Diagnosis not present

## 2015-09-18 DIAGNOSIS — R0602 Shortness of breath: Secondary | ICD-10-CM

## 2015-09-18 DIAGNOSIS — Z9981 Dependence on supplemental oxygen: Secondary | ICD-10-CM | POA: Diagnosis not present

## 2015-09-18 DIAGNOSIS — N186 End stage renal disease: Secondary | ICD-10-CM | POA: Insufficient documentation

## 2015-09-18 DIAGNOSIS — Z7982 Long term (current) use of aspirin: Secondary | ICD-10-CM | POA: Diagnosis not present

## 2015-09-18 DIAGNOSIS — E119 Type 2 diabetes mellitus without complications: Secondary | ICD-10-CM | POA: Diagnosis not present

## 2015-09-18 DIAGNOSIS — Z8619 Personal history of other infectious and parasitic diseases: Secondary | ICD-10-CM | POA: Diagnosis not present

## 2015-09-18 DIAGNOSIS — Z79899 Other long term (current) drug therapy: Secondary | ICD-10-CM | POA: Diagnosis not present

## 2015-09-18 DIAGNOSIS — E785 Hyperlipidemia, unspecified: Secondary | ICD-10-CM | POA: Diagnosis not present

## 2015-09-18 DIAGNOSIS — Z87448 Personal history of other diseases of urinary system: Secondary | ICD-10-CM | POA: Insufficient documentation

## 2015-09-18 DIAGNOSIS — Z992 Dependence on renal dialysis: Secondary | ICD-10-CM | POA: Diagnosis not present

## 2015-09-18 DIAGNOSIS — R1110 Vomiting, unspecified: Secondary | ICD-10-CM | POA: Diagnosis not present

## 2015-09-18 DIAGNOSIS — Z8719 Personal history of other diseases of the digestive system: Secondary | ICD-10-CM | POA: Diagnosis not present

## 2015-09-18 HISTORY — DX: Disorder of kidney and ureter, unspecified: N28.9

## 2015-09-18 LAB — BASIC METABOLIC PANEL
ANION GAP: 16 — AB (ref 5–15)
BUN: 33 mg/dL — AB (ref 6–20)
CO2: 27 mmol/L (ref 22–32)
Calcium: 9 mg/dL (ref 8.9–10.3)
Chloride: 98 mmol/L — ABNORMAL LOW (ref 101–111)
Creatinine, Ser: 11.25 mg/dL — ABNORMAL HIGH (ref 0.61–1.24)
GFR calc Af Amer: 5 mL/min — ABNORMAL LOW (ref >60)
GFR, EST NON AFRICAN AMERICAN: 4 mL/min — AB (ref >60)
Glucose, Bld: 77 mg/dL (ref 65–99)
POTASSIUM: 4.2 mmol/L (ref 3.5–5.1)
SODIUM: 141 mmol/L (ref 135–145)

## 2015-09-18 LAB — BRAIN NATRIURETIC PEPTIDE: B Natriuretic Peptide: 96.5 pg/mL (ref 0.0–100.0)

## 2015-09-18 LAB — I-STAT TROPONIN, ED: Troponin i, poc: 0.01 ng/mL (ref 0.00–0.08)

## 2015-09-18 LAB — CBC
HEMATOCRIT: 42.8 % (ref 39.0–52.0)
Hemoglobin: 13 g/dL (ref 13.0–17.0)
MCH: 26.4 pg (ref 26.0–34.0)
MCHC: 30.4 g/dL (ref 30.0–36.0)
MCV: 86.8 fL (ref 78.0–100.0)
Platelets: 165 10*3/uL (ref 150–400)
RBC: 4.93 MIL/uL (ref 4.22–5.81)
RDW: 17.1 % — ABNORMAL HIGH (ref 11.5–15.5)
WBC: 5.3 10*3/uL (ref 4.0–10.5)

## 2015-09-18 LAB — CBG MONITORING, ED: Glucose-Capillary: 71 mg/dL (ref 65–99)

## 2015-09-18 MED ORDER — IOHEXOL 350 MG/ML SOLN
100.0000 mL | Freq: Once | INTRAVENOUS | Status: AC | PRN
Start: 2015-09-18 — End: 2015-09-18
  Administered 2015-09-18: 100 mL via INTRAVENOUS

## 2015-09-18 MED ORDER — ONDANSETRON 4 MG PO TBDP
8.0000 mg | ORAL_TABLET | Freq: Once | ORAL | Status: AC
  Administered 2015-09-18: 8 mg via ORAL
  Filled 2015-09-18: qty 2

## 2015-09-18 NOTE — ED Notes (Signed)
CT notified that the pt is ready for his scan.

## 2015-09-18 NOTE — ED Notes (Signed)
Pt requested this RN place him on oxygen. Pt sats on room air were 99%. This RN placed the pt on 2L nasal cannula.

## 2015-09-18 NOTE — ED Notes (Signed)
Pt requesting something to eat and drink. Ok per [employer], pt given a [place] sandwich, apple sauce and diet ginger ale.

## 2015-09-18 NOTE — ED Provider Notes (Signed)
CSN: [id number]     Arrival date & time 09/18/15  1831 History   First MD Initiated Contact with Patient 09/18/15 1849     Chief Complaint  Patient presents with  . Shortness of Breath    The patient said he started having SOB at about 1600.  He said he thought it would get better but it got worse.  The patient also said he started vomiting so his son brought him to the ED to be evaluated.     (Consider location/radiation/quality/duration/timing/severity/associated sxs/prior Treatment) HPI Comments: Patient with history of ESRD on dialysis (Tuesday, Thursday, Saturday), CHF, CAD, diabetes, hypertension presents to the emergency department with chief complaint of shortness of breath. He states his symptoms started about 4:00. He reports that he also had some associated vomiting. He denies any chest pain or chest tightness. Denies fevers, chills, cough, or other symptoms. Last dialysis was yesterday. He states that he completed this in its entirety.  The history is provided by the patient. No language interpreter was used.    Past Medical History  Diagnosis Date  . Hypertension   . Hematochezia     a. 2014: colonscopy, which showed moderately-sized internal hemorrhoids, two 14mm polyps in transverse colon and ascending colon that were resected, five 2-63mm polyps in sigmoid colon, descending colon, transverse colon, and ascending colon that were resected. An upper endoscopy was performed and showed normal esophagus, stomach, and duodenum.  . Hematuria     a. H/o hematuria 2014 with cystoscopy that was unrevealing for his source of hematuria. He underwent a kidney ultrasound on 10/14 that showed mildly echogenic and scarred kidneys compatible with medical renal disease, without hydronephrosis or renal calculi.  [name] Anemia   . CAD (coronary artery disease)     a. per [facility] s/p 3.67mm x 19mm [facility] BMS to mid LAD 12/2009 and Xience DES to mid LAD 10/2010.  . Colon polyps   . Chronic  diastolic CHF (congestive heart failure) ([place])   . Hyperlipidemia   . Anginal pain ([place])   . Heart murmur   . 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 Type II diabetes mellitus ([place])   . History of blood transfusion     "had colonoscopy done; they had to give me some blood"  . Daily headache   . ESRD on dialysis [facility]) since ~ 2008    "[place]; TTS" (07/21/2015)  . On home oxygen therapy     "2L prn" (07/21/2015)   Past Surgical History  Procedure Laterality Date  . Left heart catheterization with coronary angiogram N/A 11/23/2014    Procedure: LEFT HEART CATHETERIZATION WITH CORONARY ANGIOGRAM;  Surgeon: Troy Sine, MD;  Location: [facility] CATH LAB;  Service: Cardiovascular;  Laterality: N/A;  . Lithotripsy  X1  . Cystoscopy w/ stone manipulation  X2?  . Cardiac catheterization  "several"  . Coronary angioplasty with stent placement  "several"  . Eye surgery Bilateral     "laser OR for hemorrhage"  . Av fistula placement Left ~ 2007    "upper arm"   Family History  Problem Relation Age of Onset  . Hypertension    . Bone cancer Mother   . Anuerysm Father   . Diabetes type II Daughter    Social History  Substance Use Topics  . Smoking status: Former Smoker -- 0.50 packs/day for 8 years    Types: Cigarettes    Quit date: 12/06/2010  .  Smokeless tobacco: Never Used  . Alcohol Use: No    Review of Systems  Constitutional: Negative for fever and chills.  Respiratory: Positive for shortness of breath.   Cardiovascular: Negative for chest pain.  Gastrointestinal: Positive for vomiting. Negative for nausea, diarrhea and constipation.  Genitourinary: Negative for dysuria.  All other systems reviewed and are negative.     Allergies  Enalapril  Home Medications   Prior to Admission medications   Medication Sig Start Date End Date Taking? Authorizing Provider  acetaminophen (TYLENOL) 500 MG tablet Take 500 mg by mouth every 6 (six) hours as  needed for headache.     Historical Provider, MD  amitriptyline (ELAVIL) 100 MG tablet Take 1 tablet (100 mg total) by mouth at bedtime. 07/27/15   Shela Leff, MD  amLODipine (NORVASC) 10 MG tablet Take 10 mg by mouth daily.    Historical Provider, MD  aspirin 81 MG chewable tablet Chew 1 tablet (81 mg total) by mouth daily. 07/22/15   Iline Oven, MD  atorvastatin (LIPITOR) 20 MG tablet Take 20 mg by mouth at bedtime.     Historical Provider, MD  carvedilol (COREG) 25 MG tablet Take 50 mg by mouth 2 (two) times daily.     Historical Provider, MD  cinacalcet (SENSIPAR) 90 MG tablet Take 90 mg by mouth at bedtime.     Historical Provider, MD  clopidogrel (PLAVIX) 75 MG tablet Take 1 tablet (75 mg total) by mouth daily. 11/24/14   Juluis Mire, MD  Darbepoetin Alfa (ARANESP) 25 MCG/0.42ML SOSY injection Inject 0.42 mLs (25 mcg total) into the vein every Tuesday with hemodialysis. 12/15/14   Mechele Claude, DO  doxercalciferol (HECTOROL) 4 MCG/2ML injection Inject 1 mL (2 mcg total) into the vein Every Tuesday,Thursday,and Saturday with dialysis. 12/15/14   Mechele Claude, DO  ferric gluconate 62.5 mg in sodium chloride 0.9 % 100 mL Inject 62.5 mg into the vein every Thursday with hemodialysis. 12/16/14   Mechele Claude, DO  Ipratropium-Albuterol (COMBIVENT RESPIMAT) 20-100 MCG/ACT AERS respimat Inhale 2 puffs into the lungs every 6 (six) hours as needed for wheezing.    Historical Provider, MD  isosorbide mononitrate (IMDUR) 30 MG 24 hr tablet Take 1 tablet (30 mg total) by mouth daily. 07/27/15   Shela Leff, MD  lidocaine (LIDODERM) 5 % Place 1 patch onto the skin daily. Remove & Discard patch within 12 hours or as directed by MD 06/05/15   Janece Canterbury, MD  meclizine (ANTIVERT) 25 MG tablet Take 25 mg by mouth 3 (three) times daily as needed for dizziness.    Historical Provider, MD  multivitamin (RENA-VIT) TABS tablet Take 1 tablet by mouth at bedtime. 12/15/14    Mechele Claude, DO  nitroGLYCERIN (NITROSTAT) 0.4 MG SL tablet Place 1 tablet (0.4 mg total) under the tongue every 5 (five) minutes as needed for chest pain. 06/05/15   Janece Canterbury, MD  pantoprazole (PROTONIX) 40 MG tablet Take 1 tablet (40 mg total) by mouth daily. 06/05/15   Janece Canterbury, MD  pregabalin (LYRICA) 50 MG capsule Take 1 capsule (50 mg total) by mouth daily. 12/15/14   Mechele Claude, DO  sevelamer carbonate (RENVELA) 800 MG tablet Take 2,400 mg by mouth 3 (three) times daily with meals.     Historical Provider, MD  simethicone (GAS-X) 80 MG chewable tablet Chew 1 tablet (80 mg total) by mouth every 6 (six) hours as needed for flatulence. 08/29/15   Collier Salina, MD  venlafaxine XR (EFFEXOR XR) 75 MG 24 hr capsule Take 1 capsule (75 mg total) by mouth daily with breakfast. 07/27/15   Shela Leff, MD   BP 173/106 mmHg  Pulse 102  Temp(Src) 98.2 F (36.8 C) (Oral)  Resp 25  SpO2 99% Physical Exam  Constitutional: He is oriented to person, place, and time. He appears well-developed and well-nourished.  HENT:  Head: Normocephalic and atraumatic.  Eyes: Conjunctivae and EOM are normal. Pupils are equal, round, and reactive to light. Right eye exhibits no discharge. Left eye exhibits no discharge. No scleral icterus.  Neck: Normal range of motion. Neck supple. No JVD present.  Cardiovascular: Normal rate, regular rhythm and normal heart sounds.  Exam reveals no gallop and no friction rub.   No murmur heard. Pulmonary/Chest: Effort normal and breath sounds normal. No respiratory distress. He has no wheezes. He has no rales. He exhibits no tenderness.  Abdominal: Soft. He exhibits no distension and no mass. There is no tenderness. There is no rebound and no guarding.  Musculoskeletal: Normal range of motion. He exhibits no edema or tenderness.  Neurological: He is alert and oriented to person, place, and time.  Skin: Skin is warm and dry.  Psychiatric:  He has a normal mood and affect. His behavior is normal. Judgment and thought content normal.  Nursing note and vitals reviewed.   ED Course  Procedures (including critical care time) Results for orders placed or performed during the hospital encounter of 09/18/15  CBC  Result Value Ref Range   WBC 5.3 4.0 - 10.5 K/uL   RBC 4.93 4.22 - 5.81 MIL/uL   Hemoglobin 13.0 13.0 - 17.0 g/dL   HCT 42.8 39.0 - 52.0 %   MCV 86.8 78.0 - 100.0 fL   MCH 26.4 26.0 - 34.0 pg   MCHC 30.4 30.0 - 36.0 g/dL   RDW 17.1 (H) 11.5 - 15.5 %   Platelets 165 150 - 400 K/uL  Brain natriuretic peptide  Result Value Ref Range   B Natriuretic Peptide 96.5 0.0 - 100.0 pg/mL  Basic metabolic panel  Result Value Ref Range   Sodium 141 135 - 145 mmol/L   Potassium 4.2 3.5 - 5.1 mmol/L   Chloride 98 (L) 101 - 111 mmol/L   CO2 27 22 - 32 mmol/L   Glucose, Bld 77 65 - 99 mg/dL   BUN 33 (H) 6 - 20 mg/dL   Creatinine, Ser 11.25 (H) 0.61 - 1.24 mg/dL   Calcium 9.0 8.9 - 10.3 mg/dL   GFR calc non Af Amer 4 (L) >60 mL/min   GFR calc Af Amer 5 (L) >60 mL/min   Anion gap 16 (H) 5 - 15  CBG monitoring, ED  Result Value Ref Range   Glucose-Capillary 71 65 - 99 mg/dL  I-stat troponin, ED  Result Value Ref Range   Troponin i, poc 0.01 0.00 - 0.08 ng/mL   Comment 3           Dg Chest 2 View  09/18/2015  CLINICAL DATA:  Shortness of Breath starting at 4 p.m. Vomiting. Dialysis yesterday. EXAM: CHEST  2 VIEW COMPARISON:  08/28/2015 FINDINGS: Chronic faint interstitial accentuation in the lungs. Coronary artery stent noted. Heart size within normal limits. No airspace opacity. No pleural effusion identified. Mild thoracic spondylosis. IMPRESSION: 1. Chronic faint interstitial accentuation in the lungs, without acute findings. Electronically Signed   By: Van Clines M.D.   On: 09/18/2015 19:23   Dg Chest 2 View  08/28/2015  CLINICAL DATA:  Chest pain and hurts to breathe. EXAM: CHEST  2 VIEW COMPARISON:  07/21/2015  FINDINGS: Lung markings are mildly prominent but no evidence for focal airspace disease or pulmonary edema. Heart and mediastinum are within normal limits. No acute bone abnormality. Negative for pleural effusions. Evidence for coronary artery calcifications. IMPRESSION: No active cardiopulmonary disease. Electronically Signed   By: Markus Daft M.D.   On: 08/28/2015 14:16   Ct Angio Chest Pe W/cm &/or Wo Cm  09/18/2015  CLINICAL DATA:  Shortness of breath and nausea beginning at 4 p.m. today. Dialysis for 6 years. EXAM: CT ANGIOGRAPHY CHEST WITH CONTRAST TECHNIQUE: Multidetector CT imaging of the chest was performed using the standard protocol during bolus administration of intravenous contrast. Multiplanar CT image reconstructions and MIPs were obtained to evaluate the vascular anatomy. CONTRAST:  115mL OMNIPAQUE IOHEXOL 350 MG/ML SOLN COMPARISON:  08/28/2015 FINDINGS: Technically adequate study with good opacification of the central and segmental pulmonary arteries. No focal filling defects are demonstrated. No evidence of significant pulmonary embolus. Mild cardiac enlargement with prominence of the left atrium. Coronary artery calcifications. Normal caliber thoracic aorta without evidence of dissection. Great vessel origins are patent. Mild calcification in the aorta. Esophagus is mostly decompressed. No significant lymphadenopathy in the chest. Evaluation of lungs is limited due to respiratory motion artifact. No focal airspace disease or consolidation. No pleural effusions. No pneumothorax. Airways appear patent. Included portions of the upper abdominal organs are grossly unremarkable. Degenerative changes in the spine. No destructive bone lesions. Review of the MIP images confirms the above findings. IMPRESSION: No evidence of significant pulmonary embolus. No evidence of active pulmonary disease. Electronically Signed   By: Lucienne Capers M.D.   On: 09/18/2015 23:32   Ct Angio Chest Pe W/cm &/or Wo  Cm  08/28/2015  CLINICAL DATA:  Pleuritic chest pain. EXAM: CT ANGIOGRAPHY CHEST WITH CONTRAST TECHNIQUE: Multidetector CT imaging of the chest was performed using the standard protocol during bolus administration of intravenous contrast. Multiplanar CT image reconstructions and MIPs were obtained to evaluate the vascular anatomy. CONTRAST:  37mL OMNIPAQUE IOHEXOL 350 MG/ML SOLN COMPARISON:  05/17/2015 chest CT angiogram. Chest radiograph from earlier today. FINDINGS: Mediastinum/Nodes: The study is high quality for the evaluation of pulmonary embolism. There are no filling defects in the central, lobar, segmental or subsegmental pulmonary artery branches to suggest acute pulmonary embolism. Atherosclerotic nonaneurysmal thoracic aorta. Normal caliber pulmonary arteries. Normal heart size. No pericardial fluid/thickening. Left anterior descending, left circumflex and right coronary atherosclerosis. Normal visualized thyroid. Normal esophagus. No pathologically enlarged axillary, mediastinal or hilar lymph nodes. Lungs/Pleura: No pneumothorax. No pleural effusion. No acute consolidative airspace disease, significant pulmonary nodules or lung masses. There is a stable mosaic attenuation throughout both lungs. Upper abdomen: There is perinephric fat stranding surrounding the visualized upper left kidney, nonspecific, unchanged. Musculoskeletal: No aggressive appearing focal osseous lesions. Stable T8 vertebral hemangioma. Mild degenerative changes in the thoracic spine. Review of the MIP images confirms the above findings. IMPRESSION: 1. No evidence of pulmonary embolism. 2. Mosaic attenuation throughout both lungs, unchanged, a nonspecific finding most commonly secondary to air trapping/ small airway disease, although the differential includes pulmonary vascular disease. 3. Three-vessel coronary atherosclerosis. Electronically Signed   By: Ilona Sorrel M.D.   On: 08/28/2015 16:02     I have personally reviewed  and evaluated these images and lab results as part of my medical decision-making.   EKG Interpretation   Date/Time:  Sunday September 18 2015 18:40:16  EST Ventricular Rate:  102 PR Interval:  144 QRS Duration: 109 QT Interval:  351 QTC Calculation: 457 R Axis:   86 Text Interpretation:  Sinus tachycardia Atrial premature complex No  significant change since last tracing Confirmed by Maryan Rued  MD, Loree Fee  [phone]) on 09/18/2015 7:47:55 PM      MDM   Final diagnoses:  Shortness of breath    Patient with shortness of breath.  Last HD yesterday.  Completed treatment.  Doesn't sound fluid overloaded.  Will check labs and x-ray.  Labs are baseline.  CXR unremarkable.  Patient seen by and discussed with Dr. Maryan Rued.  Recommends CT angio of chest to rule out PE.    11:56 PM CT reviewed by Dr. Maryan Rued.  Dr. Maryan Rued tells me that patient can be discharged.  I discussed the plan with the patient.  He will follow-up with his PCP.  Return precautions given.  He understands and is in agreement.   Montine Circle, PA-C 09/19/15 0005  Blanchie Dessert, MD 09/19/15 [phone]

## 2015-09-18 NOTE — ED Notes (Signed)
The patient said he started having SOB at about 1600.  He said he thought it would get better but it got worse.  The patient also said he started vomiting so his son brought him to the ED to be evaluated.  The patient denies any other symptoms.

## 2015-09-18 NOTE — Discharge Instructions (Signed)

## 2015-09-19 ENCOUNTER — Encounter: Payer: Self-pay | Admitting: General Practice

## 2015-09-19 ENCOUNTER — Ambulatory Visit: Payer: Medicare Other | Admitting: Internal Medicine

## 2015-09-27 ENCOUNTER — Ambulatory Visit: Payer: Medicare Other | Admitting: Podiatry

## 2015-09-30 ENCOUNTER — Encounter: Payer: Medicare Other | Admitting: Nurse Practitioner

## 2015-09-30 DIAGNOSIS — R0989 Other specified symptoms and signs involving the circulatory and respiratory systems: Secondary | ICD-10-CM

## 2015-10-05 ENCOUNTER — Encounter: Payer: Self-pay | Admitting: Nurse Practitioner

## 2015-10-06 LAB — HIV ANTIBODY (ROUTINE TESTING W REFLEX): HIV Screen 4th Generation wRfx: NONREACTIVE

## 2015-10-11 DIAGNOSIS — D631 Anemia in chronic kidney disease: Secondary | ICD-10-CM | POA: Diagnosis not present

## 2015-10-11 DIAGNOSIS — D509 Iron deficiency anemia, unspecified: Secondary | ICD-10-CM | POA: Diagnosis not present

## 2015-10-11 DIAGNOSIS — N2581 Secondary hyperparathyroidism of renal origin: Secondary | ICD-10-CM | POA: Diagnosis not present

## 2015-10-11 DIAGNOSIS — E119 Type 2 diabetes mellitus without complications: Secondary | ICD-10-CM | POA: Diagnosis not present

## 2015-10-11 DIAGNOSIS — N186 End stage renal disease: Secondary | ICD-10-CM | POA: Diagnosis not present

## 2015-10-12 NOTE — Addendum Note (Signed)
Addended by: Yvonna Alanis E on: 10/12/2015 06:30 PM   Modules accepted: Orders

## 2015-10-13 DIAGNOSIS — D509 Iron deficiency anemia, unspecified: Secondary | ICD-10-CM | POA: Diagnosis not present

## 2015-10-13 DIAGNOSIS — E119 Type 2 diabetes mellitus without complications: Secondary | ICD-10-CM | POA: Diagnosis not present

## 2015-10-13 DIAGNOSIS — N186 End stage renal disease: Secondary | ICD-10-CM | POA: Diagnosis not present

## 2015-10-13 DIAGNOSIS — N2581 Secondary hyperparathyroidism of renal origin: Secondary | ICD-10-CM | POA: Diagnosis not present

## 2015-10-13 DIAGNOSIS — D631 Anemia in chronic kidney disease: Secondary | ICD-10-CM | POA: Diagnosis not present

## 2015-10-15 DIAGNOSIS — N186 End stage renal disease: Secondary | ICD-10-CM | POA: Diagnosis not present

## 2015-10-15 DIAGNOSIS — D631 Anemia in chronic kidney disease: Secondary | ICD-10-CM | POA: Diagnosis not present

## 2015-10-15 DIAGNOSIS — D509 Iron deficiency anemia, unspecified: Secondary | ICD-10-CM | POA: Diagnosis not present

## 2015-10-15 DIAGNOSIS — E119 Type 2 diabetes mellitus without complications: Secondary | ICD-10-CM | POA: Diagnosis not present

## 2015-10-15 DIAGNOSIS — N2581 Secondary hyperparathyroidism of renal origin: Secondary | ICD-10-CM | POA: Diagnosis not present

## 2015-10-18 ENCOUNTER — Encounter (HOSPITAL_COMMUNITY): Payer: Self-pay | Admitting: Emergency Medicine

## 2015-10-18 ENCOUNTER — Emergency Department (HOSPITAL_COMMUNITY): Payer: Medicare Other

## 2015-10-18 ENCOUNTER — Emergency Department (HOSPITAL_COMMUNITY)
Admission: EM | Admit: 2015-10-18 | Discharge: 2015-10-18 | Disposition: A | Discharge status: Home / Self Care | Payer: Medicare Other | Attending: Emergency Medicine | Admitting: Emergency Medicine

## 2015-10-18 DIAGNOSIS — E119 Type 2 diabetes mellitus without complications: Secondary | ICD-10-CM | POA: Insufficient documentation

## 2015-10-18 DIAGNOSIS — I120 Hypertensive chronic kidney disease with stage 5 chronic kidney disease or end stage renal disease: Secondary | ICD-10-CM | POA: Insufficient documentation

## 2015-10-18 DIAGNOSIS — I5032 Chronic diastolic (congestive) heart failure: Secondary | ICD-10-CM | POA: Diagnosis not present

## 2015-10-18 DIAGNOSIS — R079 Chest pain, unspecified: Secondary | ICD-10-CM | POA: Insufficient documentation

## 2015-10-18 DIAGNOSIS — Z79899 Other long term (current) drug therapy: Secondary | ICD-10-CM | POA: Diagnosis not present

## 2015-10-18 DIAGNOSIS — Z992 Dependence on renal dialysis: Secondary | ICD-10-CM | POA: Diagnosis not present

## 2015-10-18 DIAGNOSIS — Z862 Personal history of diseases of the blood and blood-forming organs and certain disorders involving the immune mechanism: Secondary | ICD-10-CM | POA: Diagnosis not present

## 2015-10-18 DIAGNOSIS — R0789 Other chest pain: Secondary | ICD-10-CM | POA: Diagnosis not present

## 2015-10-18 DIAGNOSIS — E785 Hyperlipidemia, unspecified: Secondary | ICD-10-CM | POA: Insufficient documentation

## 2015-10-18 DIAGNOSIS — R0602 Shortness of breath: Secondary | ICD-10-CM | POA: Diagnosis not present

## 2015-10-18 DIAGNOSIS — R011 Cardiac murmur, unspecified: Secondary | ICD-10-CM | POA: Diagnosis not present

## 2015-10-18 DIAGNOSIS — Z7982 Long term (current) use of aspirin: Secondary | ICD-10-CM | POA: Insufficient documentation

## 2015-10-18 DIAGNOSIS — I25119 Atherosclerotic heart disease of native coronary artery with unspecified angina pectoris: Secondary | ICD-10-CM | POA: Insufficient documentation

## 2015-10-18 DIAGNOSIS — Z7902 Long term (current) use of antithrombotics/antiplatelets: Secondary | ICD-10-CM | POA: Diagnosis not present

## 2015-10-18 DIAGNOSIS — Z87891 Personal history of nicotine dependence: Secondary | ICD-10-CM | POA: Insufficient documentation

## 2015-10-18 DIAGNOSIS — N186 End stage renal disease: Secondary | ICD-10-CM | POA: Insufficient documentation

## 2015-10-18 DIAGNOSIS — Z8611 Personal history of tuberculosis: Secondary | ICD-10-CM | POA: Diagnosis not present

## 2015-10-18 DIAGNOSIS — Z86010 Personal history of colonic polyps: Secondary | ICD-10-CM | POA: Diagnosis not present

## 2015-10-18 LAB — I-STAT TROPONIN, ED: Troponin i, poc: 0.06 ng/mL (ref 0.00–0.08)

## 2015-10-18 LAB — CBC
HEMATOCRIT: 37.7 % — AB (ref 39.0–52.0)
HEMOGLOBIN: 11.9 g/dL — AB (ref 13.0–17.0)
MCH: 26.5 pg (ref 26.0–34.0)
MCHC: 31.6 g/dL (ref 30.0–36.0)
MCV: 84 fL (ref 78.0–100.0)
Platelets: 186 10*3/uL (ref 150–400)
RBC: 4.49 MIL/uL (ref 4.22–5.81)
RDW: 16.5 % — ABNORMAL HIGH (ref 11.5–15.5)
WBC: 7.7 10*3/uL (ref 4.0–10.5)

## 2015-10-18 LAB — BASIC METABOLIC PANEL
Anion gap: 19 — ABNORMAL HIGH (ref 5–15)
BUN: 53 mg/dL — ABNORMAL HIGH (ref 6–20)
CHLORIDE: 96 mmol/L — AB (ref 101–111)
CO2: 24 mmol/L (ref 22–32)
Calcium: 8.6 mg/dL — ABNORMAL LOW (ref 8.9–10.3)
Creatinine, Ser: 14.16 mg/dL — ABNORMAL HIGH (ref 0.61–1.24)
GFR calc non Af Amer: 3 mL/min — ABNORMAL LOW (ref >60)
GFR, EST AFRICAN AMERICAN: 4 mL/min — AB (ref >60)
Glucose, Bld: 85 mg/dL (ref 65–99)
POTASSIUM: 4.3 mmol/L (ref 3.5–5.1)
SODIUM: 139 mmol/L (ref 135–145)

## 2015-10-18 MED ORDER — OXYCODONE-ACETAMINOPHEN 5-325 MG PO TABS
2.0000 | ORAL_TABLET | Freq: Once | ORAL | Status: AC
  Administered 2015-10-18: 2 via ORAL
  Filled 2015-10-18: qty 2

## 2015-10-18 MED ORDER — ONDANSETRON 4 MG PO TBDP
4.0000 mg | ORAL_TABLET | Freq: Once | ORAL | Status: AC
  Administered 2015-10-18: 4 mg via ORAL
  Filled 2015-10-18: qty 1

## 2015-10-18 NOTE — ED Provider Notes (Signed)
CSN: [id number]     Arrival date & time 10/18/15  1158 History   First MD Initiated Contact with Patient 10/18/15 1212     Chief Complaint  Patient presents with  . Chest Pain     HPI Patient reports ongoing chest discomfort over the past 24 hours.  He states his left anterior chest pain is worse with movement and lying down.  He states it is located in his left anterior chest is worse with movement of his left arm as well.  He is end-stage renal disease.  He dialyzes Tuesday Thursday Saturday.  He states he is unable to go to dialysis today secondary to severe pain in his left chest.  He does have a history of coronary artery disease status post intervention in the past.  He also has a long-standing history of atypical chest pain including multiple visits to the emergency department hospitalizations in the past 12-15 months.  He tried Tylenol at home without improvement in his symptoms.  No fevers or chills.  Denies productive cough.  No other complaints at this time.   Past Medical History  Diagnosis Date  . Hypertension   . Hematochezia     a. 2014: colonscopy, which showed moderately-sized internal hemorrhoids, two 27mm polyps in transverse colon and ascending colon that were resected, five 2-35mm polyps in sigmoid colon, descending colon, transverse colon, and ascending colon that were resected. An upper endoscopy was performed and showed normal esophagus, stomach, and duodenum.  . Hematuria     a. H/o hematuria 2014 with cystoscopy that was unrevealing for his source of hematuria. He underwent a kidney ultrasound on 10/14 that showed mildly echogenic and scarred kidneys compatible with medical renal disease, without hydronephrosis or renal calculi.  [name] Anemia   . CAD (coronary artery disease)     a. per [facility] s/p 3.33mm x 47mm Vision BMS to mid LAD 12/2009 and Xience DES to mid LAD 10/2010.  . Colon polyps   . Chronic diastolic CHF (congestive heart failure) ([place])   . Hyperlipidemia    . Anginal pain ([place])   . Heart murmur   . 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 Type II diabetes mellitus ([place])   . History of blood transfusion     "had colonoscopy done; they had to give me some blood"  . Daily headache   . ESRD on dialysis [facility]) since ~ 2008    "[place]; TTS" (07/21/2015)  . On home oxygen therapy     "2L prn" (07/21/2015)  . Renal insufficiency    Past Surgical History  Procedure Laterality Date  . Left heart catheterization with coronary angiogram N/A 11/23/2014    Procedure: LEFT HEART CATHETERIZATION WITH CORONARY ANGIOGRAM;  Surgeon: Troy Sine, MD;  Location: [facility] CATH LAB;  Service: Cardiovascular;  Laterality: N/A;  . Lithotripsy  X1  . Cystoscopy w/ stone manipulation  X2?  . Cardiac catheterization  "several"  . Coronary angioplasty with stent placement  "several"  . Eye surgery Bilateral     "laser OR for hemorrhage"  . Av fistula placement Left ~ 2007    "upper arm"   Family History  Problem Relation Age of Onset  . Hypertension    . Bone cancer Mother   . Anuerysm Father   . Diabetes type II Daughter    Social History  Substance Use Topics  . Smoking status: Former Smoker -- 0.50 packs/day for 8  years    Types: Cigarettes    Quit date: 12/06/2010  . Smokeless tobacco: Never Used  . Alcohol Use: No    Review of Systems  All other systems reviewed and are negative.     Allergies  Enalapril  Home Medications   Prior to Admission medications   Medication Sig Start Date End Date Taking? Authorizing Provider  acetaminophen (TYLENOL) 500 MG tablet Take 500 mg by mouth every 6 (six) hours as needed for headache.     Historical Provider, MD  amitriptyline (ELAVIL) 100 MG tablet Take 1 tablet (100 mg total) by mouth at bedtime. 07/27/15   Shela Leff, MD  amLODipine (NORVASC) 10 MG tablet Take 10 mg by mouth daily.    Historical Provider, MD  aspirin 81 MG chewable tablet Chew 1 tablet (81 mg  total) by mouth daily. 07/22/15   Iline Oven, MD  atorvastatin (LIPITOR) 20 MG tablet Take 20 mg by mouth at bedtime.     Historical Provider, MD  carvedilol (COREG) 25 MG tablet Take 50 mg by mouth 2 (two) times daily.     Historical Provider, MD  cinacalcet (SENSIPAR) 90 MG tablet Take 90 mg by mouth at bedtime.     Historical Provider, MD  clopidogrel (PLAVIX) 75 MG tablet Take 1 tablet (75 mg total) by mouth daily. 11/24/14   Juluis Mire, MD  Darbepoetin Alfa (ARANESP) 25 MCG/0.42ML SOSY injection Inject 0.42 mLs (25 mcg total) into the vein every Tuesday with hemodialysis. 12/15/14   Geradine Girt, DO  doxercalciferol (HECTOROL) 4 MCG/2ML injection Inject 1 mL (2 mcg total) into the vein Every Tuesday,Thursday,and Saturday with dialysis. 12/15/14   Geradine Girt, DO  ferric gluconate 62.5 mg in sodium chloride 0.9 % 100 mL Inject 62.5 mg into the vein every Thursday with hemodialysis. 12/16/14   Geradine Girt, DO  isosorbide mononitrate (IMDUR) 30 MG 24 hr tablet Take 1 tablet (30 mg total) by mouth daily. 07/27/15   Shela Leff, MD  lidocaine (LIDODERM) 5 % Place 1 patch onto the skin daily. Remove & Discard patch within 12 hours or as directed by MD 06/05/15   Janece Canterbury, MD  meclizine (ANTIVERT) 25 MG tablet Take 25 mg by mouth 3 (three) times daily as needed for dizziness.    Historical Provider, MD  multivitamin (RENA-VIT) TABS tablet Take 1 tablet by mouth at bedtime. 12/15/14   Geradine Girt, DO  nitroGLYCERIN (NITROSTAT) 0.4 MG SL tablet Place 1 tablet (0.4 mg total) under the tongue every 5 (five) minutes as needed for chest pain. 06/05/15   Janece Canterbury, MD  pantoprazole (PROTONIX) 40 MG tablet Take 1 tablet (40 mg total) by mouth daily. Patient taking differently: Take 40 mg by mouth daily as needed (for acid reflux).  06/05/15   Janece Canterbury, MD  pregabalin (LYRICA) 50 MG capsule Take 1 capsule (50 mg total) by mouth daily. 12/15/14   Geradine Girt, DO  sevelamer  carbonate (RENVELA) 800 MG tablet Take 2,400 mg by mouth 3 (three) times daily with meals.     Historical Provider, MD  simethicone (GAS-X) 80 MG chewable tablet Chew 1 tablet (80 mg total) by mouth every 6 (six) hours as needed for flatulence. 08/29/15   Collier Salina, MD  venlafaxine XR (EFFEXOR XR) 75 MG 24 hr capsule Take 1 capsule (75 mg total) by mouth daily with breakfast. 07/27/15   Shela Leff, MD   BP 178/116 mmHg  Pulse 113  Temp(Src)  98.5 F (36.9 C) (Oral)  Resp 20  Ht 6' (1.829 m)  Wt 210 lb 3.2 oz (95.346 kg)  BMI 28.50 kg/m2  SpO2 100% Physical Exam  Constitutional: He is oriented to person, place, and time. He appears well-developed and well-nourished.  HENT:  Head: Normocephalic and atraumatic.  Eyes: EOM are normal.  Neck: Normal range of motion.  Cardiovascular: Normal rate, regular rhythm, normal heart sounds and intact distal pulses.   Pulmonary/Chest: Effort normal and breath sounds normal. No respiratory distress.  Tenderness of left anterior chest wall without rash.  No erythema, warmth, fluctuance of the left anterior chest.    Abdominal: Soft. He exhibits no distension. There is no tenderness.  Musculoskeletal: Normal range of motion.  Normal thrill of the left upper extremity AV fistula.  No swelling of the left arm as compared to right.  Neurological: He is alert and oriented to person, place, and time.  Skin: Skin is warm and dry.  Psychiatric: He has a normal mood and affect. Judgment normal.  Nursing note and vitals reviewed.   ED Course  Procedures (including critical care time) Labs Review Labs Reviewed  BASIC METABOLIC PANEL - Abnormal; Notable for the following:    Chloride 96 (*)    BUN 53 (*)    Creatinine, Ser 14.16 (*)    Calcium 8.6 (*)    GFR calc non Af Amer 3 (*)    GFR calc Af Amer 4 (*)    Anion gap 19 (*)    All other components within normal limits  CBC - Abnormal; Notable for the following:    Hemoglobin 11.9 (*)     HCT 37.7 (*)    RDW 16.5 (*)    All other components within normal limits  I-STAT TROPOININ, ED    Imaging Review Dg Chest 2 View  10/18/2015  CLINICAL DATA:  Chest pain and shortness of breath for 2 days EXAM: CHEST - 2 VIEW COMPARISON:  09/18/2015 FINDINGS: Cardiac shadow is mildly enlarged but stable. The lungs are well aerated bilaterally. Mild central congestion is noted. No interstitial edema is noted. No effusion or infiltrate is seen. IMPRESSION: Mild central vascular congestion. No other focal abnormality is seen. Electronically Signed   By: Inez Catalina M.D.   On: 10/18/2015 13:00   I have personally reviewed and evaluated these images and lab results as part of my medical decision-making.   EKG Interpretation   Date/Time:  Tuesday October 18 2015 12:06:54 EST Ventricular Rate:  108 PR Interval:  138 QRS Duration: 96 QT Interval:  356 QTC Calculation: 477 R Axis:   69 Text Interpretation:  Sinus tachycardia Otherwise normal ECG No  significant change was found Confirmed by Tuesday Terlecki  MD, Lennette Bihari ([id number]) on  10/18/2015 12:28:19 PM      MDM   Final diagnoses:  None    3:14 PM Patient feels much better at this time.  His pain seems to be reproducible in the left anterior chest without surrounding signs of infection.  Doubt DVT or combination with his left AV fistula.  Discharge home in good condition.  Primary care follow-up.  He understands to return to the ER for new or worsening symptoms    Jola Schmidt, MD 10/18/15 [phone]

## 2015-10-18 NOTE — ED Notes (Signed)
Pt sts mid chest pain worse with movement and laying down; pt sts SOB when laying flat; pt dialysis pt with last dialysis Saturday; pt was short 1 hour on Saturday

## 2015-10-20 DIAGNOSIS — D509 Iron deficiency anemia, unspecified: Secondary | ICD-10-CM | POA: Diagnosis not present

## 2015-10-20 DIAGNOSIS — N186 End stage renal disease: Secondary | ICD-10-CM | POA: Diagnosis not present

## 2015-10-20 DIAGNOSIS — N2581 Secondary hyperparathyroidism of renal origin: Secondary | ICD-10-CM | POA: Diagnosis not present

## 2015-10-20 DIAGNOSIS — E119 Type 2 diabetes mellitus without complications: Secondary | ICD-10-CM | POA: Diagnosis not present

## 2015-10-20 DIAGNOSIS — D631 Anemia in chronic kidney disease: Secondary | ICD-10-CM | POA: Diagnosis not present

## 2015-10-22 DIAGNOSIS — E119 Type 2 diabetes mellitus without complications: Secondary | ICD-10-CM | POA: Diagnosis not present

## 2015-10-22 DIAGNOSIS — N186 End stage renal disease: Secondary | ICD-10-CM | POA: Diagnosis not present

## 2015-10-22 DIAGNOSIS — D509 Iron deficiency anemia, unspecified: Secondary | ICD-10-CM | POA: Diagnosis not present

## 2015-10-22 DIAGNOSIS — N2581 Secondary hyperparathyroidism of renal origin: Secondary | ICD-10-CM | POA: Diagnosis not present

## 2015-10-22 DIAGNOSIS — D631 Anemia in chronic kidney disease: Secondary | ICD-10-CM | POA: Diagnosis not present

## 2015-10-25 DIAGNOSIS — N186 End stage renal disease: Secondary | ICD-10-CM | POA: Diagnosis not present

## 2015-10-25 DIAGNOSIS — D509 Iron deficiency anemia, unspecified: Secondary | ICD-10-CM | POA: Diagnosis not present

## 2015-10-25 DIAGNOSIS — E119 Type 2 diabetes mellitus without complications: Secondary | ICD-10-CM | POA: Diagnosis not present

## 2015-10-25 DIAGNOSIS — N2581 Secondary hyperparathyroidism of renal origin: Secondary | ICD-10-CM | POA: Diagnosis not present

## 2015-10-25 DIAGNOSIS — D631 Anemia in chronic kidney disease: Secondary | ICD-10-CM | POA: Diagnosis not present

## 2015-10-27 DIAGNOSIS — D509 Iron deficiency anemia, unspecified: Secondary | ICD-10-CM | POA: Diagnosis not present

## 2015-10-27 DIAGNOSIS — D631 Anemia in chronic kidney disease: Secondary | ICD-10-CM | POA: Diagnosis not present

## 2015-10-27 DIAGNOSIS — E119 Type 2 diabetes mellitus without complications: Secondary | ICD-10-CM | POA: Diagnosis not present

## 2015-10-27 DIAGNOSIS — N2581 Secondary hyperparathyroidism of renal origin: Secondary | ICD-10-CM | POA: Diagnosis not present

## 2015-10-27 DIAGNOSIS — N186 End stage renal disease: Secondary | ICD-10-CM | POA: Diagnosis not present

## 2015-10-28 ENCOUNTER — Emergency Department (HOSPITAL_COMMUNITY): Payer: Medicare Other

## 2015-10-28 ENCOUNTER — Encounter (HOSPITAL_COMMUNITY): Payer: Self-pay

## 2015-10-28 ENCOUNTER — Inpatient Hospital Stay (HOSPITAL_COMMUNITY)
Admission: EM | Admit: 2015-10-28 | Discharge: 2015-10-31 | DRG: 291 | Disposition: A | Discharge status: Home / Self Care | Payer: Medicare Other | Attending: Internal Medicine | Admitting: Internal Medicine

## 2015-10-28 DIAGNOSIS — Z7902 Long term (current) use of antithrombotics/antiplatelets: Secondary | ICD-10-CM

## 2015-10-28 DIAGNOSIS — Z8611 Personal history of tuberculosis: Secondary | ICD-10-CM

## 2015-10-28 DIAGNOSIS — I132 Hypertensive heart and chronic kidney disease with heart failure and with stage 5 chronic kidney disease, or end stage renal disease: Principal | ICD-10-CM | POA: Diagnosis present

## 2015-10-28 DIAGNOSIS — R51 Headache: Secondary | ICD-10-CM | POA: Diagnosis not present

## 2015-10-28 DIAGNOSIS — Z87891 Personal history of nicotine dependence: Secondary | ICD-10-CM

## 2015-10-28 DIAGNOSIS — I10 Essential (primary) hypertension: Secondary | ICD-10-CM | POA: Diagnosis present

## 2015-10-28 DIAGNOSIS — G8191 Hemiplegia, unspecified affecting right dominant side: Secondary | ICD-10-CM

## 2015-10-28 DIAGNOSIS — R29706 NIHSS score 6: Secondary | ICD-10-CM | POA: Diagnosis present

## 2015-10-28 DIAGNOSIS — Z9981 Dependence on supplemental oxygen: Secondary | ICD-10-CM

## 2015-10-28 DIAGNOSIS — N186 End stage renal disease: Secondary | ICD-10-CM | POA: Diagnosis present

## 2015-10-28 DIAGNOSIS — E119 Type 2 diabetes mellitus without complications: Secondary | ICD-10-CM | POA: Diagnosis not present

## 2015-10-28 DIAGNOSIS — R112 Nausea with vomiting, unspecified: Secondary | ICD-10-CM

## 2015-10-28 DIAGNOSIS — R011 Cardiac murmur, unspecified: Secondary | ICD-10-CM | POA: Diagnosis present

## 2015-10-28 DIAGNOSIS — Z992 Dependence on renal dialysis: Secondary | ICD-10-CM

## 2015-10-28 DIAGNOSIS — E1142 Type 2 diabetes mellitus with diabetic polyneuropathy: Secondary | ICD-10-CM | POA: Diagnosis present

## 2015-10-28 DIAGNOSIS — R1110 Vomiting, unspecified: Secondary | ICD-10-CM | POA: Diagnosis not present

## 2015-10-28 DIAGNOSIS — R29898 Other symptoms and signs involving the musculoskeletal system: Secondary | ICD-10-CM | POA: Insufficient documentation

## 2015-10-28 DIAGNOSIS — R531 Weakness: Secondary | ICD-10-CM | POA: Diagnosis present

## 2015-10-28 DIAGNOSIS — M6281 Muscle weakness (generalized): Secondary | ICD-10-CM | POA: Diagnosis not present

## 2015-10-28 DIAGNOSIS — I5042 Chronic combined systolic (congestive) and diastolic (congestive) heart failure: Secondary | ICD-10-CM | POA: Diagnosis present

## 2015-10-28 DIAGNOSIS — Z8639 Personal history of other endocrine, nutritional and metabolic disease: Secondary | ICD-10-CM

## 2015-10-28 DIAGNOSIS — E785 Hyperlipidemia, unspecified: Secondary | ICD-10-CM | POA: Diagnosis present

## 2015-10-28 DIAGNOSIS — D649 Anemia, unspecified: Secondary | ICD-10-CM | POA: Diagnosis present

## 2015-10-28 DIAGNOSIS — Z7982 Long term (current) use of aspirin: Secondary | ICD-10-CM

## 2015-10-28 DIAGNOSIS — K219 Gastro-esophageal reflux disease without esophagitis: Secondary | ICD-10-CM | POA: Diagnosis present

## 2015-10-28 DIAGNOSIS — N2581 Secondary hyperparathyroidism of renal origin: Secondary | ICD-10-CM | POA: Diagnosis present

## 2015-10-28 DIAGNOSIS — I2510 Atherosclerotic heart disease of native coronary artery without angina pectoris: Secondary | ICD-10-CM | POA: Diagnosis present

## 2015-10-28 DIAGNOSIS — Z888 Allergy status to other drugs, medicaments and biological substances status: Secondary | ICD-10-CM | POA: Diagnosis not present

## 2015-10-28 DIAGNOSIS — K828 Other specified diseases of gallbladder: Secondary | ICD-10-CM | POA: Diagnosis not present

## 2015-10-28 DIAGNOSIS — I5189 Other ill-defined heart diseases: Secondary | ICD-10-CM | POA: Diagnosis present

## 2015-10-28 DIAGNOSIS — I519 Heart disease, unspecified: Secondary | ICD-10-CM | POA: Diagnosis not present

## 2015-10-28 DIAGNOSIS — E8770 Fluid overload, unspecified: Secondary | ICD-10-CM | POA: Diagnosis present

## 2015-10-28 DIAGNOSIS — Z955 Presence of coronary angioplasty implant and graft: Secondary | ICD-10-CM

## 2015-10-28 DIAGNOSIS — R519 Headache, unspecified: Secondary | ICD-10-CM

## 2015-10-28 DIAGNOSIS — J810 Acute pulmonary edema: Secondary | ICD-10-CM | POA: Diagnosis not present

## 2015-10-28 DIAGNOSIS — G839 Paralytic syndrome, unspecified: Secondary | ICD-10-CM | POA: Diagnosis not present

## 2015-10-28 DIAGNOSIS — M6289 Other specified disorders of muscle: Secondary | ICD-10-CM | POA: Diagnosis not present

## 2015-10-28 DIAGNOSIS — D631 Anemia in chronic kidney disease: Secondary | ICD-10-CM | POA: Diagnosis not present

## 2015-10-28 DIAGNOSIS — E1122 Type 2 diabetes mellitus with diabetic chronic kidney disease: Secondary | ICD-10-CM | POA: Diagnosis present

## 2015-10-28 DIAGNOSIS — Z79899 Other long term (current) drug therapy: Secondary | ICD-10-CM

## 2015-10-28 DIAGNOSIS — Z794 Long term (current) use of insulin: Secondary | ICD-10-CM | POA: Diagnosis not present

## 2015-10-28 DIAGNOSIS — I6789 Other cerebrovascular disease: Secondary | ICD-10-CM | POA: Diagnosis not present

## 2015-10-28 DIAGNOSIS — I120 Hypertensive chronic kidney disease with stage 5 chronic kidney disease or end stage renal disease: Secondary | ICD-10-CM | POA: Diagnosis not present

## 2015-10-28 DIAGNOSIS — M5032 Other cervical disc degeneration, mid-cervical region, unspecified level: Secondary | ICD-10-CM | POA: Diagnosis not present

## 2015-10-28 LAB — I-STAT CHEM 8, ED
BUN: 29 mg/dL — AB (ref 6–20)
CREATININE: 8.2 mg/dL — AB (ref 0.61–1.24)
Calcium, Ion: 1.02 mmol/L — ABNORMAL LOW (ref 1.12–1.23)
Chloride: 102 mmol/L (ref 101–111)
Glucose, Bld: 102 mg/dL — ABNORMAL HIGH (ref 65–99)
HEMATOCRIT: 41 % (ref 39.0–52.0)
Hemoglobin: 13.9 g/dL (ref 13.0–17.0)
POTASSIUM: 4.6 mmol/L (ref 3.5–5.1)
SODIUM: 137 mmol/L (ref 135–145)
TCO2: 27 mmol/L (ref 0–100)

## 2015-10-28 LAB — COMPREHENSIVE METABOLIC PANEL
ALT: 7 U/L — ABNORMAL LOW (ref 17–63)
ANION GAP: 19 — AB (ref 5–15)
AST: 11 U/L — ABNORMAL LOW (ref 15–41)
Albumin: 3.4 g/dL — ABNORMAL LOW (ref 3.5–5.0)
Alkaline Phosphatase: 86 U/L (ref 38–126)
BUN: 22 mg/dL — ABNORMAL HIGH (ref 6–20)
CHLORIDE: 97 mmol/L — AB (ref 101–111)
CO2: 22 mmol/L (ref 22–32)
Calcium: 9.8 mg/dL (ref 8.9–10.3)
Creatinine, Ser: 8.66 mg/dL — ABNORMAL HIGH (ref 0.61–1.24)
GFR, EST AFRICAN AMERICAN: 7 mL/min — AB (ref >60)
GFR, EST NON AFRICAN AMERICAN: 6 mL/min — AB (ref >60)
Glucose, Bld: 102 mg/dL — ABNORMAL HIGH (ref 65–99)
POTASSIUM: 4.5 mmol/L (ref 3.5–5.1)
Sodium: 138 mmol/L (ref 135–145)
TOTAL PROTEIN: 7.5 g/dL (ref 6.5–8.1)
Total Bilirubin: 0.6 mg/dL (ref 0.3–1.2)

## 2015-10-28 LAB — CBC
HCT: 38.5 % — ABNORMAL LOW (ref 39.0–52.0)
Hemoglobin: 12 g/dL — ABNORMAL LOW (ref 13.0–17.0)
MCH: 26.9 pg (ref 26.0–34.0)
MCHC: 31.2 g/dL (ref 30.0–36.0)
MCV: 86.3 fL (ref 78.0–100.0)
Platelets: 196 K/uL (ref 150–400)
RBC: 4.46 MIL/uL (ref 4.22–5.81)
RDW: 16.6 % — ABNORMAL HIGH (ref 11.5–15.5)
WBC: 8 K/uL (ref 4.0–10.5)

## 2015-10-28 LAB — DIFFERENTIAL
Basophils Absolute: 0 K/uL (ref 0.0–0.1)
Basophils Relative: 1 %
Eosinophils Absolute: 0.2 K/uL (ref 0.0–0.7)
Eosinophils Relative: 3 %
Lymphocytes Relative: 12 %
Lymphs Abs: 1 K/uL (ref 0.7–4.0)
Monocytes Absolute: 0.8 K/uL (ref 0.1–1.0)
Monocytes Relative: 10 %
Neutro Abs: 5.9 K/uL (ref 1.7–7.7)
Neutrophils Relative %: 74 %

## 2015-10-28 LAB — ETHANOL

## 2015-10-28 LAB — APTT: aPTT: 39 s — ABNORMAL HIGH (ref 24–37)

## 2015-10-28 LAB — I-STAT TROPONIN, ED: Troponin i, poc: 0.03 ng/mL (ref 0.00–0.08)

## 2015-10-28 LAB — PROTIME-INR
INR: 1.13 (ref 0.00–1.49)
Prothrombin Time: 14.7 seconds (ref 11.6–15.2)

## 2015-10-28 MED ORDER — DILTIAZEM HCL 25 MG/5ML IV SOLN: 10.0000 mg | Freq: Once | INTRAVENOUS | Status: DC

## 2015-10-28 MED ORDER — ONDANSETRON HCL 4 MG/2ML IJ SOLN
4.0000 mg | Freq: Once | INTRAMUSCULAR | Status: AC
  Administered 2015-10-28: 4 mg via INTRAVENOUS
  Filled 2015-10-28: qty 2

## 2015-10-28 NOTE — ED Notes (Signed)
Pt. Is aware of needing an urine specimen 

## 2015-10-28 NOTE — ED Notes (Signed)
Pt asking for for food.   During neuro checks he was appearing not to be  Able to move his rt side as well as the left

## 2015-10-28 NOTE — ED Notes (Signed)
The pt is  C/o some pain when he breathes he just returned from [facility]

## 2015-10-28 NOTE — ED Notes (Signed)
Attempted to call report charge rn has not assigned the pt yet  Call back

## 2015-10-28 NOTE — ED Notes (Signed)
Pt ambulated down hallway without assistance. Slow steady gait noted. Pt in NAD/sob.

## 2015-10-28 NOTE — ED Provider Notes (Signed)
CSN: [id number]     Arrival date & time 10/28/15  1516 History   First MD Initiated Contact with Patient 10/28/15 1521     Chief Complaint  Patient presents with  . Code Stroke    @EDPCLEARED @ (Consider location/radiation/quality/duration/timing/severity/associated sxs/prior Treatment) HPI   60 y M w PMH CAD, CHF, ESRD, HL coming in with complaint of R sided weakness since this morning.  Patient reports that his last normal was at 3am which was approx 12 hours ago.  At 8am he noticed weakness on his R arm and leg, no other sig sx.  EMS was called out and patient was made code stroke, he was noted to be hypertensive to [id number] systolic with EMS, other vitals were unremarkable.  Past Medical History  Diagnosis Date  . Hypertension   . Hematochezia     a. 2014: colonscopy, which showed moderately-sized internal hemorrhoids, two 30mm polyps in transverse colon and ascending colon that were resected, five 2-28mm polyps in sigmoid colon, descending colon, transverse colon, and ascending colon that were resected. An upper endoscopy was performed and showed normal esophagus, stomach, and duodenum.  . Hematuria     a. H/o hematuria 2014 with cystoscopy that was unrevealing for his source of hematuria. He underwent a kidney ultrasound on 10/14 that showed mildly echogenic and scarred kidneys compatible with medical renal disease, without hydronephrosis or renal calculi.  [name] Anemia   . CAD (coronary artery disease)     a. per [facility] s/p 3.84mm x 64mm Vision BMS to mid LAD 12/2009 and Xience DES to mid LAD 10/2010.  . Colon polyps   . Chronic diastolic CHF (congestive heart failure) ([place])   . Hyperlipidemia   . Anginal pain ([place])   . Heart murmur   . 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 Type II diabetes mellitus ([place])   . History of blood transfusion     "had colonoscopy done; they had to give me some blood"  . Daily headache   . ESRD on dialysis [facility]) since ~  2008    "[place]; TTS" (07/21/2015)  . On home oxygen therapy     "2L prn" (07/21/2015)  . Renal insufficiency    Past Surgical History  Procedure Laterality Date  . Left heart catheterization with coronary angiogram N/A 11/23/2014    Procedure: LEFT HEART CATHETERIZATION WITH CORONARY ANGIOGRAM;  Surgeon: Troy Sine, MD;  Location: [facility] CATH LAB;  Service: Cardiovascular;  Laterality: N/A;  . Lithotripsy  X1  . Cystoscopy w/ stone manipulation  X2?  . Cardiac catheterization  "several"  . Coronary angioplasty with stent placement  "several"  . Eye surgery Bilateral     "laser OR for hemorrhage"  . Av fistula placement Left ~ 2007    "upper arm"   Family History  Problem Relation Age of Onset  . Hypertension    . Bone cancer Mother   . Anuerysm Father   . Diabetes type II Daughter    Social History  Substance Use Topics  . Smoking status: Former Smoker -- 0.50 packs/day for 8 years    Types: Cigarettes    Quit date: 12/06/2010  . Smokeless tobacco: Never Used  . Alcohol Use: No    Review of Systems  Constitutional: Negative for fever and chills.  Eyes: Negative for redness.  Respiratory: Negative for cough and shortness of breath.   Cardiovascular: Negative for chest pain.  Gastrointestinal: Negative for  nausea, vomiting, abdominal pain and diarrhea.  Genitourinary: Negative for dysuria.  Skin: Negative for rash.  Neurological: Positive for weakness. Negative for headaches.  All other systems reviewed and are negative.     Allergies  Enalapril  Home Medications   Prior to Admission medications   Medication Sig Start Date End Date Taking? Authorizing Provider  acetaminophen (TYLENOL) 500 MG tablet Take 500 mg by mouth every 6 (six) hours as needed for headache.    Yes Historical Provider, MD  amitriptyline (ELAVIL) 100 MG tablet Take 1 tablet (100 mg total) by mouth at bedtime. 07/27/15  Yes Shela Leff, MD  amLODipine (NORVASC) 10 MG tablet Take 10  mg by mouth daily.   Yes Historical Provider, MD  aspirin 81 MG chewable tablet Chew 1 tablet (81 mg total) by mouth daily. 07/22/15  Yes Iline Oven, MD  atorvastatin (LIPITOR) 20 MG tablet Take 20 mg by mouth at bedtime.    Yes Historical Provider, MD  carvedilol (COREG) 25 MG tablet Take 50 mg by mouth 2 (two) times daily.    Yes Historical Provider, MD  cinacalcet (SENSIPAR) 90 MG tablet Take 90 mg by mouth at bedtime.    Yes Historical Provider, MD  clopidogrel (PLAVIX) 75 MG tablet Take 1 tablet (75 mg total) by mouth daily. 11/24/14  Yes Juluis Mire, MD  doxercalciferol (HECTOROL) 4 MCG/2ML injection Inject 1 mL (2 mcg total) into the vein Every Tuesday,Thursday,and Saturday with dialysis. 12/15/14  Yes Geradine Girt, DO  ferric gluconate 62.5 mg in sodium chloride 0.9 % 100 mL Inject 62.5 mg into the vein every Thursday with hemodialysis. 12/16/14  Yes Geradine Girt, DO  isosorbide mononitrate (IMDUR) 30 MG 24 hr tablet Take 1 tablet (30 mg total) by mouth daily. 07/27/15  Yes Shela Leff, MD  meclizine (ANTIVERT) 25 MG tablet Take 25 mg by mouth 3 (three) times daily as needed for dizziness.   Yes Historical Provider, MD  multivitamin (RENA-VIT) TABS tablet Take 1 tablet by mouth at bedtime. 12/15/14  Yes Geradine Girt, DO  nitroGLYCERIN (NITROSTAT) 0.4 MG SL tablet Place 1 tablet (0.4 mg total) under the tongue every 5 (five) minutes as needed for chest pain. 06/05/15  Yes Janece Canterbury, MD  pantoprazole (PROTONIX) 40 MG tablet Take 1 tablet (40 mg total) by mouth daily. Patient taking differently: Take 40 mg by mouth daily as needed (for acid reflux).  06/05/15  Yes Janece Canterbury, MD  pregabalin (LYRICA) 50 MG capsule Take 1 capsule (50 mg total) by mouth daily. 12/15/14  Yes Geradine Girt, DO  sevelamer carbonate (RENVELA) 800 MG tablet Take 2,400 mg by mouth 3 (three) times daily with meals.    Yes Historical Provider, MD  simethicone (GAS-X) 80 MG chewable tablet Chew 1  tablet (80 mg total) by mouth every 6 (six) hours as needed for flatulence. 08/29/15  Yes Collier Salina, MD  venlafaxine XR (EFFEXOR XR) 75 MG 24 hr capsule Take 1 capsule (75 mg total) by mouth daily with breakfast. 07/27/15  Yes Shela Leff, MD  Darbepoetin Alfa (ARANESP) 25 MCG/0.42ML SOSY injection Inject 0.42 mLs (25 mcg total) into the vein every Tuesday with hemodialysis. 12/15/14   Jessica U Vann, DO   BP 177/96 mmHg  Pulse 100  Temp(Src) 98.9 F (37.2 C) (Oral)  Resp 18  SpO2 98% Physical Exam  Constitutional: He is oriented to person, place, and time. No distress.  HENT:  Head: Normocephalic and atraumatic.  Eyes: EOM  are normal. Pupils are equal, round, and reactive to light.  Neck: Normal range of motion. Neck supple.  Cardiovascular: Normal rate.   Pulmonary/Chest: Effort normal. No respiratory distress.  Abdominal: Soft. There is no tenderness.  Musculoskeletal: Normal range of motion.  RUE and RLE weakness compared to L but patient able to ambulate  Neurological: He is alert and oriented to person, place, and time.  Skin: No rash noted. He is not diaphoretic.  Psychiatric: He has a normal mood and affect.    ED Course  Procedures (including critical care time) Labs Review Labs Reviewed  APTT - Abnormal; Notable for the following:    aPTT 39 (*)    All other components within normal limits  CBC - Abnormal; Notable for the following:    Hemoglobin 12.0 (*)    HCT 38.5 (*)    RDW 16.6 (*)    All other components within normal limits  COMPREHENSIVE METABOLIC PANEL - Abnormal; Notable for the following:    Chloride 97 (*)    Glucose, Bld 102 (*)    BUN 22 (*)    Creatinine, Ser 8.66 (*)    Albumin 3.4 (*)    AST 11 (*)    ALT 7 (*)    GFR calc non Af Amer 6 (*)    GFR calc Af Amer 7 (*)    Anion gap 19 (*)    All other components within normal limits  I-STAT CHEM 8, ED - Abnormal; Notable for the following:    BUN 29 (*)    Creatinine, Ser 8.20  (*)    Glucose, Bld 102 (*)    Calcium, Ion 1.02 (*)    All other components within normal limits  ETHANOL  PROTIME-INR  DIFFERENTIAL  URINE RAPID DRUG SCREEN, HOSP PERFORMED  URINALYSIS, ROUTINE W REFLEX MICROSCOPIC (NOT AT [facility])  Randolm Idol, ED    Imaging Review Dg Chest 2 View  10/28/2015  CLINICAL DATA:  Right arm and leg weakness. EXAM: CHEST  2 VIEW COMPARISON:  October 18, 2015. FINDINGS: Stable cardiomediastinal silhouette. Mild central pulmonary vascular congestion is noted with bilateral pulmonary edema. No pneumothorax or pleural effusion is noted. Bony thorax is unremarkable. IMPRESSION: Mild central pulmonary vascular congestion with bilateral pulmonary edema. Electronically Signed   By: Marijo Conception, M.D.   On: 10/28/2015 16:30   Ct Head Wo Contrast  10/28/2015  CLINICAL DATA:  Right-sided weakness today EXAM: CT HEAD WITHOUT CONTRAST TECHNIQUE: Contiguous axial images were obtained from the base of the skull through the vertex without intravenous contrast. COMPARISON:  04/01/2015 FINDINGS: No skull fracture. No significant inflammatory change in the sinuses. No hemorrhage or extra-axial fluid. No hydrocephalus. Mild atrophy. Moderate diffuse low attenuation in the deep white matter. No evidence of mass or vascular territory infarct. IMPRESSION: Stable chronic involutional change with no acute findings Electronically Signed   By: Skipper Cliche M.D.   On: 10/28/2015 15:38   Mr Brain Wo Contrast  10/28/2015  CLINICAL DATA:  59 year old diabetic hypertensive male on dialysis with hyperlipidemia presenting with right-sided weakness and headache. Subsequent encounter. EXAM: MRI HEAD WITHOUT CONTRAST TECHNIQUE: Multiplanar, multiecho pulse sequences of the brain and surrounding structures were obtained without intravenous contrast. COMPARISON:  10/28/2015 CT and 04/01/2015 MR FINDINGS: No acute infarct, intracranial hemorrhage or hydrocephalus. Remote infarct of the thalamus  bilaterally. Remote posterior right frontal lobe smaller infarct. Prominent small vessel disease changes most notable periventricular region and within the pons. No intracranial mass lesion noted  on this unenhanced exam. Major intracranial vascular structures are patent. Moderate exophthalmos. Symmetric normal appearance of the extra-ocular muscles. Question abnormality within the cord at the C3 level. Cervical spine MR can be obtained for further delineation. Cervical medullary junction, pituitary region and fine-needle region within normal limits. IMPRESSION: No acute infarct or hydrocephalus. Remote infarct of the thalamus bilaterally. Remote posterior right frontal lobe smaller infarct. Prominent small vessel disease changes most notable periventricular region and within the pons. No intracranial mass lesion noted on this unenhanced exam. Moderate exophthalmos. Symmetric normal appearance of the extra-ocular muscles. Question abnormality within the cord at the C3 level. Cervical spine MR can be obtained for further delineation. Electronically Signed   By: Genia Del M.D.   On: 10/28/2015 20:12   I have personally reviewed and evaluated these images and lab results as part of my medical decision-making.   EKG Interpretation   Date/Time:  Friday October 28 2015 15:42:23 EST Ventricular Rate:  101 PR Interval:  142 QRS Duration: 106 QT Interval:  363 QTC Calculation: 470 R Axis:   89 Text Interpretation:  Sinus tachycardia Probable left atrial enlargement  Left ventricular hypertrophy Anterior Q waves, possibly due to LVH no  acute ischemia No significant change since last tracing Confirmed by  Gerald Leitz ([id number]) on 10/28/2015 11:47:41 PM      MDM   Final diagnoses:  Arm weakness  Right leg weakness    88 y M w PMH CAD, CHF, ESRD, HL coming in with complaint of R sided weakness since this morning.   Exam as above.  Code stroke PTA but patient is outside of the window. A:   Intact B:  BLBS C:  Hypertensive, mild tachycardia,  Patient sent to CT scanner just after arrival and evaluated by neuro who feel this is unlikely to be acute cva and recommend stat MR brain to further evaluate.   Patient has no infectious sx, no fever/chills/neck stiffness, doubt meningitis.  Have obtained cbc/bmp to eval for metabolic causes of this weakness and both studies were unremarkable.  Doubt spinal compression given that the weakness is unilateral.  Will plan to obtain MR brain in the Ed.  If MR brain unremarkable, will plan for outpatient follow up.  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  Neuro recommends admission for tia work up given that patient has had improvement in his RLE weakness.  Will admit to hospitalist service.  Jarome Matin, MD 10/28/15 2348  Courteney Julio Alm, MD 10/29/15 MR:[id number]

## 2015-10-28 NOTE — Code Documentation (Signed)
60yo male arriving to [facility] via [place] at 1516.  Patient reports that he had dialysis yesterday and was at his baseline.  He woke up at 0300 this morning with no issues.  He went back to bed and woke up at 0800 with right sided weakness.  Patient also reporting HA and altered gait.  Code stroke called by EMS.  Labs drawn on arrival and patient cleared by EDP.  Stroke team to the bedside.  LKW determined to be 0300.  Patient to CT and CT completed.  Patient to Trauma C.  NIHSS 6, see documentation for details and code stroke times.  Patient with right arm and leg weakness although it is inconsistent during exam.  Patient is outside the window for treatment with tPA.  Bedside handoff with ED RN Cecille Rubin.

## 2015-10-28 NOTE — ED Notes (Signed)
Updated pt.s daughter , Miracle on pt. s status.  Pt. Spoke with her on the phone

## 2015-10-28 NOTE — Consult Note (Deleted)
[facility] History and Physical  Cedrie Skaggs [id number] DOB: 04/02/1956 DOA: 10/28/2015  Referring physician: ED PCP: [name]   Chief Complaint: Right arm and leg weakness  HPI:  Mr. Maat is a 60 year old right-hand dominant male with past medical history significant for ESRD on HD (T/TH/Sat), HTN, HLD, DM type 2, CAD s/p stenting, chronic CHF; who presents with complaints of right arm and leg weakness. Patient notes that he woke up around 3 AM as he normally does to have his morning coffee and felt somewhat weak on his right side. He laid back down and states that symptoms waxed and waned in intensity throughout the early morning. However at 1 PM 1 symptoms not improving he notified family to bring him to the hospital for further evaluation. He had hemodialysis yesterday but notes that it hurts to take a deep breath in. Denies any fever, chills, headache, neck pain, or new vision changes. He notes he fell a couple weeks ago, but did not hit his head after losing his balance when he stood too quickly.  Upon admission to the emergency department patient states that the right leg lower extremity weakness has improved some, but the right upper extremity weakness is unchanged. He was evaluated with a CT of the brain which showed no acute abnormalities. Neurology evaluated the patient and though this was possibly a functional weakness and ordered a MRI. MRI showed a possible abnormality in the cervical spine, but no acute infarct.    Review of Systems  Constitutional: Negative for chills and malaise/fatigue.  HENT: Negative for ear pain and tinnitus.   Eyes: Positive for blurred vision. Negative for pain.  Respiratory: Positive for shortness of breath. Negative for cough.   Cardiovascular: Positive for chest pain. Negative for leg swelling.  Gastrointestinal: Negative for nausea, vomiting and abdominal pain.  Genitourinary: Negative for urgency and frequency.   Musculoskeletal: Positive for joint pain and falls.  Skin: Negative for itching and rash.  Neurological: Positive for focal weakness. Negative for speech change, seizures and loss of consciousness.  Psychiatric/Behavioral: Negative for hallucinations and substance abuse.     Past Medical History  Diagnosis Date  . Hypertension   . Hematochezia     a. 2014: colonscopy, which showed moderately-sized internal hemorrhoids, two 54mm polyps in transverse colon and ascending colon that were resected, five 2-58mm polyps in sigmoid colon, descending colon, transverse colon, and ascending colon that were resected. An upper endoscopy was performed and showed normal esophagus, stomach, and duodenum.  . Hematuria     a. H/o hematuria 2014 with cystoscopy that was unrevealing for his source of hematuria. He underwent a kidney ultrasound on 10/14 that showed mildly echogenic and scarred kidneys compatible with medical renal disease, without hydronephrosis or renal calculi.  [name] Anemia   . CAD (coronary artery disease)     a. per [facility] s/p 3.30mm x 68mm Vision BMS to mid LAD 12/2009 and Xience DES to mid LAD 10/2010.  . Colon polyps   . Chronic diastolic CHF (congestive heart failure) ([place])   . Hyperlipidemia   . Anginal pain ([place])   . Heart murmur   . 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 Type II diabetes mellitus ([place])   . History of blood transfusion     "had colonoscopy done; they had to give me some blood"  . Daily headache   . ESRD on dialysis [facility]) since ~ 2008    "  [place]; TTS" (07/21/2015)  . On home oxygen therapy     "2L prn" (07/21/2015)  . Renal insufficiency      Past Surgical History  Procedure Laterality Date  . Left heart catheterization with coronary angiogram N/A 11/23/2014    Procedure: LEFT HEART CATHETERIZATION WITH CORONARY ANGIOGRAM;  Surgeon: Troy Sine, MD;  Location: [facility] CATH LAB;  Service: Cardiovascular;  Laterality: N/A;  .  Lithotripsy  X1  . Cystoscopy w/ stone manipulation  X2?  . Cardiac catheterization  "several"  . Coronary angioplasty with stent placement  "several"  . Eye surgery Bilateral     "laser OR for hemorrhage"  . Av fistula placement Left ~ 2007    "upper arm"      Social History:  reports that he quit smoking about 4 years ago. His smoking use included Cigarettes. He has a 4 pack-year smoking history. He has never used smokeless tobacco. He reports that he does not drink alcohol or use illicit drugs. Where does patient live--home  Can patient participate in ADLs?Yes  Allergies  Allergen Reactions  . Enalapril Hives    Family History  Problem Relation Age of Onset  . Hypertension    . Bone cancer Mother   . Anuerysm Father   . Diabetes type II Daughter        Prior to Admission medications   Medication Sig Start Date End Date Taking? Authorizing Provider  acetaminophen (TYLENOL) 500 MG tablet Take 500 mg by mouth every 6 (six) hours as needed for headache.    Yes Historical Provider, MD  amitriptyline (ELAVIL) 100 MG tablet Take 1 tablet (100 mg total) by mouth at bedtime. 07/27/15  Yes Shela Leff, MD  amLODipine (NORVASC) 10 MG tablet Take 10 mg by mouth daily.   Yes Historical Provider, MD  aspirin 81 MG chewable tablet Chew 1 tablet (81 mg total) by mouth daily. 07/22/15  Yes Iline Oven, MD  atorvastatin (LIPITOR) 20 MG tablet Take 20 mg by mouth at bedtime.    Yes Historical Provider, MD  carvedilol (COREG) 25 MG tablet Take 50 mg by mouth 2 (two) times daily.    Yes Historical Provider, MD  cinacalcet (SENSIPAR) 90 MG tablet Take 90 mg by mouth at bedtime.    Yes Historical Provider, MD  clopidogrel (PLAVIX) 75 MG tablet Take 1 tablet (75 mg total) by mouth daily. 11/24/14  Yes Juluis Mire, MD  doxercalciferol (HECTOROL) 4 MCG/2ML injection Inject 1 mL (2 mcg total) into the vein Every Tuesday,Thursday,and Saturday with dialysis. 12/15/14  Yes Geradine Girt,  DO  ferric gluconate 62.5 mg in sodium chloride 0.9 % 100 mL Inject 62.5 mg into the vein every Thursday with hemodialysis. 12/16/14  Yes Geradine Girt, DO  isosorbide mononitrate (IMDUR) 30 MG 24 hr tablet Take 1 tablet (30 mg total) by mouth daily. 07/27/15  Yes Shela Leff, MD  meclizine (ANTIVERT) 25 MG tablet Take 25 mg by mouth 3 (three) times daily as needed for dizziness.   Yes Historical Provider, MD  multivitamin (RENA-VIT) TABS tablet Take 1 tablet by mouth at bedtime. 12/15/14  Yes Geradine Girt, DO  nitroGLYCERIN (NITROSTAT) 0.4 MG SL tablet Place 1 tablet (0.4 mg total) under the tongue every 5 (five) minutes as needed for chest pain. 06/05/15  Yes Janece Canterbury, MD  pantoprazole (PROTONIX) 40 MG tablet Take 1 tablet (40 mg total) by mouth daily. Patient taking differently: Take 40 mg by mouth daily as needed (for  acid reflux).  06/05/15  Yes Janece Canterbury, MD  pregabalin (LYRICA) 50 MG capsule Take 1 capsule (50 mg total) by mouth daily. 12/15/14  Yes Geradine Girt, DO  sevelamer carbonate (RENVELA) 800 MG tablet Take 2,400 mg by mouth 3 (three) times daily with meals.    Yes Historical Provider, MD  simethicone (GAS-X) 80 MG chewable tablet Chew 1 tablet (80 mg total) by mouth every 6 (six) hours as needed for flatulence. 08/29/15  Yes Collier Salina, MD  venlafaxine XR (EFFEXOR XR) 75 MG 24 hr capsule Take 1 capsule (75 mg total) by mouth daily with breakfast. 07/27/15  Yes Shela Leff, MD  Darbepoetin Alfa (ARANESP) 25 MCG/0.42ML SOSY injection Inject 0.42 mLs (25 mcg total) into the vein every Tuesday with hemodialysis. 12/15/14   Geradine Girt, DO     Physical Exam: Filed Vitals:   10/28/15 1845 10/28/15 1900 10/28/15 2015 10/28/15 2016  BP: 176/105 185/113 180/105 180/105  Pulse: 99 98 100 100  Temp:      TempSrc:      Resp: 22 20  18   SpO2: 95% 96% 94% 95%     Constitutional: Vital signs reviewed. Patient is a well-developed and well-nourished in no  acute distress and cooperative with exam. Alert and oriented x3.  Head: Normocephalic and atraumatic  Ear: TM normal bilaterally  Mouth: no erythema or exudates, MMM  Eyes: PERRL, EOMI, conjunctivae normal, No scleral icterus.  Neck: Supple, Trachea midline normal ROM, No JVD, mass, thyromegaly, or carotid bruit present.  Cardiovascular: RRR, S1 normal, S2 normal, no MRG, pulses symmetric and intact bilaterally  Pulmonary/Chest: Positive crackles throughout both lung fields, no wheezes or rhonchi appreciated Abdominal: Soft. Non-tender, non-distended, bowel sounds are normal, no masses, organomegaly, or guarding present.  GU: no CVA tenderness Musculoskeletal: No joint deformities, erythema, or stiffness, ROM full and no nontender Ext: no edema and no cyanosis, pulses palpable bilaterally (DP and PT)  Hematology: no cervical, inginal, or axillary adenopathy.  Neurological: A&O x3, Strenght is 5 out of 5 on the left upper & lower extremity, Strength is 4 out of 5 on the right upper and lower extremity.  Skin: Warm, dry and intact. No rash, cyanosis, or clubbing.  Psychiatric: Normal mood and affect. speech and behavior is normal. Judgment and thought content normal. Cognition and memory are normal.      Data Review   Micro Results No results found for this or any previous visit (from the past 240 hour(s)).  Radiology Reports Dg Chest 2 View  10/28/2015  CLINICAL DATA:  Right arm and leg weakness. EXAM: CHEST  2 VIEW COMPARISON:  October 18, 2015. FINDINGS: Stable cardiomediastinal silhouette. Mild central pulmonary vascular congestion is noted with bilateral pulmonary edema. No pneumothorax or pleural effusion is noted. Bony thorax is unremarkable. IMPRESSION: Mild central pulmonary vascular congestion with bilateral pulmonary edema. Electronically Signed   By: Marijo Conception, M.D.   On: 10/28/2015 16:30   Dg Chest 2 View  10/18/2015  CLINICAL DATA:  Chest pain and shortness of breath  for 2 days EXAM: CHEST - 2 VIEW COMPARISON:  09/18/2015 FINDINGS: Cardiac shadow is mildly enlarged but stable. The lungs are well aerated bilaterally. Mild central congestion is noted. No interstitial edema is noted. No effusion or infiltrate is seen. IMPRESSION: Mild central vascular congestion. No other focal abnormality is seen. Electronically Signed   By: Inez Catalina M.D.   On: 10/18/2015 13:00   Ct Head Wo Contrast  10/28/2015  CLINICAL DATA:  Right-sided weakness today EXAM: CT HEAD WITHOUT CONTRAST TECHNIQUE: Contiguous axial images were obtained from the base of the skull through the vertex without intravenous contrast. COMPARISON:  04/01/2015 FINDINGS: No skull fracture. No significant inflammatory change in the sinuses. No hemorrhage or extra-axial fluid. No hydrocephalus. Mild atrophy. Moderate diffuse low attenuation in the deep white matter. No evidence of mass or vascular territory infarct. IMPRESSION: Stable chronic involutional change with no acute findings Electronically Signed   By: Skipper Cliche M.D.   On: 10/28/2015 15:38   Mr Brain Wo Contrast  10/28/2015  CLINICAL DATA:  60 year old diabetic hypertensive male on dialysis with hyperlipidemia presenting with right-sided weakness and headache. Subsequent encounter. EXAM: MRI HEAD WITHOUT CONTRAST TECHNIQUE: Multiplanar, multiecho pulse sequences of the brain and surrounding structures were obtained without intravenous contrast. COMPARISON:  10/28/2015 CT and 04/01/2015 MR FINDINGS: No acute infarct, intracranial hemorrhage or hydrocephalus. Remote infarct of the thalamus bilaterally. Remote posterior right frontal lobe smaller infarct. Prominent small vessel disease changes most notable periventricular region and within the pons. No intracranial mass lesion noted on this unenhanced exam. Major intracranial vascular structures are patent. Moderate exophthalmos. Symmetric normal appearance of the extra-ocular muscles. Question abnormality  within the cord at the C3 level. Cervical spine MR can be obtained for further delineation. Cervical medullary junction, pituitary region and fine-needle region within normal limits. IMPRESSION: No acute infarct or hydrocephalus. Remote infarct of the thalamus bilaterally. Remote posterior right frontal lobe smaller infarct. Prominent small vessel disease changes most notable periventricular region and within the pons. No intracranial mass lesion noted on this unenhanced exam. Moderate exophthalmos. Symmetric normal appearance of the extra-ocular muscles. Question abnormality within the cord at the C3 level. Cervical spine MR can be obtained for further delineation. Electronically Signed   By: Genia Del M.D.   On: 10/28/2015 20:12     CBC  Recent Labs Lab 10/28/15 1522 10/28/15 1528  WBC 8.0  --   HGB 12.0* 13.9  HCT 38.5* 41.0  PLT 196  --   MCV 86.3  --   MCH 26.9  --   MCHC 31.2  --   RDW 16.6*  --   LYMPHSABS 1.0  --   MONOABS 0.8  --   EOSABS 0.2  --   BASOSABS 0.0  --     Chemistries   Recent Labs Lab 10/28/15 1522 10/28/15 1528  NA 138 137  K 4.5 4.6  CL 97* 102  CO2 22  --   GLUCOSE 102* 102*  BUN 22* 29*  CREATININE 8.66* 8.20*  CALCIUM 9.8  --   AST 11*  --   ALT 7*  --   ALKPHOS 86  --   BILITOT 0.6  --    ------------------------------------------------------------------------------------------------------------------ estimated creatinine clearance is 11.9 mL/min (by C-G formula based on Cr of 8.2). ------------------------------------------------------------------------------------------------------------------ No results for input(s): HGBA1C in the last 72 hours. ------------------------------------------------------------------------------------------------------------------ No results for input(s): CHOL, HDL, LDLCALC, TRIG, CHOLHDL, LDLDIRECT in the last 72  hours. ------------------------------------------------------------------------------------------------------------------ No results for input(s): TSH, T4TOTAL, T3FREE, THYROIDAB in the last 72 hours.  Invalid input(s): FREET3 ------------------------------------------------------------------------------------------------------------------ No results for input(s): VITAMINB12, FOLATE, FERRITIN, TIBC, IRON, RETICCTPCT in the last 72 hours.  Coagulation profile  Recent Labs Lab 10/28/15 1522  INR 1.13    No results for input(s): DDIMER in the last 72 hours.  Cardiac Enzymes No results for input(s): CKMB, TROPONINI, MYOGLOBIN in the last 168 hours.  Invalid input(s): CK ------------------------------------------------------------------------------------------------------------------ Invalid input(s): POCBNP  CBG: No results for input(s): GLUCAP in the last 168 hours.     EKG: Independently reviewed. Sinus tachycardia with signs of biatrial enlargement and LVH  Assessment/Plan Principal Problem: Acute right-sided weakness: Symptoms started at approximately 3 AM this morning and have with the patient states waxed and waned in intensity throughout the day. CT and MRI of the brain showed no acute signs of a stroke. MRI did so some abnormality of the cervical spine for which he recommended a MRI of the cervical spine to further evaluate - Admit to telemetry bed - Neuro checks  - PT/OT/ SP eval and treat - MRI of the cervical spine to further evaluate - Follow-up neuro recommendations   ESRD on hemodialysis: Patient dialyzes Tuesday, Thursday, and Saturdays. Chest x-ray showing mild central pulmonary vascular congestion with bilateral pulmonary edema. Patient  appears to be volume overload - Nephro consult as patient needs hemodialysis in a.m. showing some signs of fluid overload - Continue Sensipar, Renvela  Diabetes mellitus type 2: Last hemoglobin A1c was 6 in August  2016. - CBGs q4 hr with sensitive SSI  Hypertension:  Uncontrolled.  Systolic blood pressures over 200 at times over night. -Hydralazine 10mg  IV prn sBp  >180 or dBp >100 - Continue Coreg, norvasc  Coronary artery disease s/p percutaneous intervention - Continue aspirin, Plavix  Systolic congestive heart failure EF 45-50% on echo in 05/2015 with grade 2 diastolic dysfunction - Continue isosorbide mononitrate  GERD - Protonix  Code Status:   full Family Communication: bedside Disposition Plan: admit   Total time spent 55 minutes.Greater than 50% of this time was spent in counseling, explanation of diagnosis, planning of further management, and coordination of care  [facility] Pager [phone]  If 7PM-7AM, please contact night-coverage [email or web address] Password [email or web address] 10/28/2015, 9:07 PM

## 2015-10-28 NOTE — Consult Note (Signed)
Referring Physician: ED    Chief Complaint: code stroke, right sided weakness, imbalance  HPI:                                                                                                                                         Vanessa Gunkel is an 60 y.o. male, right handed, with a past medical history significant for HTN, HLD, DM type 2, CAD s/p stenting, chronic congestive heart failure, ESRD on HD, brought in as a code stroke due to acute onset of the above stated symptoms. Patient furnishes an inconsistent story about time of onset of symptoms, but it seems that he was probably well around 3 am today and then woke up at 8 already not feeling well and shortly thereafter developed weakness of the right side as well as HA. Did not seek immediate medical attention. Denies vertigo, double vision, slurred speech, language or vision impairment. Additionally, he complains of new onset right arm pain. NIHSS 6 but some functionality on exam. CT brain was personally reviewed and showed no acute abnormality. Date last known well: 10/28/15 Time last known well: unclear tPA Given: no, out of the window NIHSS: 6 MRS: 0  Past Medical History  Diagnosis Date  . Hypertension   . Hematochezia     a. 2014: colonscopy, which showed moderately-sized internal hemorrhoids, two 47mm polyps in transverse colon and ascending colon that were resected, five 2-57mm polyps in sigmoid colon, descending colon, transverse colon, and ascending colon that were resected. An upper endoscopy was performed and showed normal esophagus, stomach, and duodenum.  . Hematuria     a. H/o hematuria 2014 with cystoscopy that was unrevealing for his source of hematuria. He underwent a kidney ultrasound on 10/14 that showed mildly echogenic and scarred kidneys compatible with medical renal disease, without hydronephrosis or renal calculi.  [name] Anemia   . CAD (coronary artery disease)     a. per [facility] s/p 3.5mm x 48mm Vision BMS to  mid LAD 12/2009 and Xience DES to mid LAD 10/2010.  . Colon polyps   . Chronic diastolic CHF (congestive heart failure) ([place])   . Hyperlipidemia   . Anginal pain ([place])   . Heart murmur   . 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 Type II diabetes mellitus ([place])   . History of blood transfusion     "had colonoscopy done; they had to give me some blood"  . Daily headache   . ESRD on dialysis [facility]) since ~ 2008    "[place]; TTS" (07/21/2015)  . On home oxygen therapy     "2L prn" (07/21/2015)  . Renal insufficiency     Past Surgical History  Procedure Laterality Date  . Left heart catheterization with coronary angiogram N/A 11/23/2014    Procedure: LEFT HEART CATHETERIZATION WITH CORONARY ANGIOGRAM;  Surgeon: Marcello Moores  Floyce Stakes, MD;  Location: [facility] CATH LAB;  Service: Cardiovascular;  Laterality: N/A;  . Lithotripsy  X1  . Cystoscopy w/ stone manipulation  X2?  . Cardiac catheterization  "several"  . Coronary angioplasty with stent placement  "several"  . Eye surgery Bilateral     "laser OR for hemorrhage"  . Av fistula placement Left ~ 2007    "upper arm"    Family History  Problem Relation Age of Onset  . Hypertension    . Bone cancer Mother   . Anuerysm Father   . Diabetes type II Daughter    Social History:  reports that he quit smoking about 4 years ago. His smoking use included Cigarettes. He has a 4 pack-year smoking history. He has never used smokeless tobacco. He reports that he does not drink alcohol or use illicit drugs. Family history: no MS, epilepsy, or brain tumor Allergies:  Allergies  Allergen Reactions  . Enalapril Hives    Medications:                                                                                                                           I have reviewed the patient's current medications.  ROS:                                                                                                                                        History obtained from chart review and the patient  General ROS: negative for - fever, night sweats, or weight loss Psychological ROS: negative for - behavioral disorder, hallucinations, memory difficulties, mood swings or suicidal ideation Ophthalmic ROS: negative for - blurry vision, double vision, eye pain or loss of vision ENT ROS: negative for - epistaxis, nasal discharge, oral lesions, sore throat, tinnitus or vertigo Allergy and Immunology ROS: negative for - hives or itchy/watery eyes Hematological and Lymphatic ROS: negative for - bleeding problems, bruising or swollen lymph nodes Endocrine ROS: negative for - galactorrhea, hair pattern changes, polydipsia/polyuria or temperature intolerance Respiratory ROS: negative for - cough, hemoptysis, shortness of breath or wheezing Cardiovascular ROS: negative for - chest pain, dyspnea on exertion, edema or irregular heartbeat Gastrointestinal ROS: negative for - abdominal pain, diarrhea, hematemesis, nausea/vomiting or stool incontinence Genito-Urinary ROS: negative for - dysuria, hematuria, incontinence or urinary frequency/urgency Musculoskeletal ROS: negative for - joint swelling Neurological ROS: as noted in HPI  Dermatological ROS: negative for rash and skin lesion changes  Physical exam:  Constitutional: well developed, pleasant male in no apparent distress. Blood pressure 181/105, pulse 101, temperature 98.6 F (37 C), temperature source Oral, resp. rate 26, SpO2 96 %. Eyes: no jaundice or exophthalmos.  Head: normocephalic. Neck: supple, no bruits, no JVD. Cardiac: no murmurs. Lungs: clear. Abdomen: soft, no tender, no mass. Extremities: no edema, clubbing, or cyanosis.  Skin: no rash  Neurologic Examination:                                                                                                      General: NAD Mental Status: Alert, oriented, thought content appropriate.  Speech fluent without evidence  of aphasia.  Able to follow 3 step commands without difficulty. Cranial Nerves: II: Discs flat bilaterally; Visual fields grossly normal, pupils equal, round, reactive to light and accommodation III,IV, VI: ptosis not present, extra-ocular motions intact bilaterally V,VII: smile symmetric, facial light touch sensation normal bilaterally VIII: hearing normal bilaterally IX,X: uvula rises symmetrically XI: bilateral shoulder shrug XII: midline tongue extension without atrophy or fasciculations Motor: Significant for right arm and leg weakness but I am afraid there is a functional component. Tone and bulk:normal tone throughout; no atrophy noted Sensory: Pinprick and light touch intact throughout, bilaterally Deep Tendon Reflexes:  Right: Upper Extremity   Left: Upper extremity   biceps (C-5 to C-6) 2/4   biceps (C-5 to C-6) 2/4 tricep (C7) 2/4    triceps (C7) 2/4 Brachioradialis (C6) 2/4  Brachioradialis (C6) 2/4  Lower Extremity Lower Extremity  quadriceps (L-2 to L-4) 2/4   quadriceps (L-2 to L-4) 2/4 Achilles (S1) 2/4   Achilles (S1) 2/4  Plantars: Right: downgoing   Left: downgoing Cerebellar: normal finger-to-nose,  normal heel-to-shin test Gait: No tested due to multiple leads     Results for orders placed or performed during the hospital encounter of 10/28/15 (from the past 48 hour(s))  Protime-INR     Status: None   Collection Time: 10/28/15  3:22 PM  Result Value Ref Range   Prothrombin Time 14.7 11.6 - 15.2 seconds   INR 1.13 0.00 - 1.49  APTT     Status: Abnormal   Collection Time: 10/28/15  3:22 PM  Result Value Ref Range   aPTT 39 (H) 24 - 37 seconds    Comment:        IF BASELINE aPTT IS ELEVATED, SUGGEST PATIENT RISK ASSESSMENT BE USED TO DETERMINE APPROPRIATE ANTICOAGULANT THERAPY.   CBC     Status: Abnormal   Collection Time: 10/28/15  3:22 PM  Result Value Ref Range   WBC 8.0 4.0 - 10.5 K/uL   RBC 4.46 4.22 - 5.81 MIL/uL   Hemoglobin 12.0 (L) 13.0  - 17.0 g/dL   HCT 38.5 (L) 39.0 - 52.0 %   MCV 86.3 78.0 - 100.0 fL   MCH 26.9 26.0 - 34.0 pg   MCHC 31.2 30.0 - 36.0 g/dL   RDW 16.6 (H) 11.5 - 15.5 %   Platelets 196 150 - 400 K/uL  Differential  Status: None   Collection Time: 10/28/15  3:22 PM  Result Value Ref Range   Neutrophils Relative % 74 %   Neutro Abs 5.9 1.7 - 7.7 K/uL   Lymphocytes Relative 12 %   Lymphs Abs 1.0 0.7 - 4.0 K/uL   Monocytes Relative 10 %   Monocytes Absolute 0.8 0.1 - 1.0 K/uL   Eosinophils Relative 3 %   Eosinophils Absolute 0.2 0.0 - 0.7 K/uL   Basophils Relative 1 %   Basophils Absolute 0.0 0.0 - 0.1 K/uL  I-stat troponin, ED (not at [facility], [facility])     Status: None   Collection Time: 10/28/15  3:26 PM  Result Value Ref Range   Troponin i, poc 0.03 0.00 - 0.08 ng/mL   Comment 3            Comment: Due to the release kinetics of cTnI, a negative result within the first hours of the onset of symptoms does not rule out myocardial infarction with certainty. If myocardial infarction is still suspected, repeat the test at appropriate intervals.   I-Stat Chem 8, ED  (not at [facility], [facility])     Status: Abnormal   Collection Time: 10/28/15  3:28 PM  Result Value Ref Range   Sodium 137 135 - 145 mmol/L   Potassium 4.6 3.5 - 5.1 mmol/L   Chloride 102 101 - 111 mmol/L   BUN 29 (H) 6 - 20 mg/dL   Creatinine, Ser 8.20 (H) 0.61 - 1.24 mg/dL   Glucose, Bld 102 (H) 65 - 99 mg/dL   Calcium, Ion 1.02 (L) 1.12 - 1.23 mmol/L   TCO2 27 0 - 100 mmol/L   Hemoglobin 13.9 13.0 - 17.0 g/dL   HCT 41.0 39.0 - 52.0 %   Ct Head Wo Contrast  10/28/2015  CLINICAL DATA:  Right-sided weakness today EXAM: CT HEAD WITHOUT CONTRAST TECHNIQUE: Contiguous axial images were obtained from the base of the skull through the vertex without intravenous contrast. COMPARISON:  04/01/2015 FINDINGS: No skull fracture. No significant inflammatory change in the sinuses. No hemorrhage or extra-axial fluid. No hydrocephalus. Mild atrophy. Moderate  diffuse low attenuation in the deep white matter. No evidence of mass or vascular territory infarct. IMPRESSION: Stable chronic involutional change with no acute findings Electronically Signed   By: Skipper Cliche M.D.   On: 10/28/2015 15:38    Assessment: 60 y.o. male brought in as a code stroke due to acute onset right sided weakness and imbalance. NIHSS 6 but there is a frank functional component on exam. CT brain without acute abnormality. Although he has several risk factors for stroke and can not exclude a left subcortical infarct, the truth of the matter is that his exam is compromised by a functional component that makes me wonder if he has functional right hemiparesis. Would recommend MRI brain and further stroke work up only if MRI+.   Stroke Risk Factors -  HTN, HLD, DM type 2, CAD s/p stenting, chronic congestive heart failure, ESRD    Dorian Pod, MD [facility] [phone]  10/28/2015, 3:56 PM

## 2015-10-28 NOTE — ED Notes (Signed)
Pt. Having nausea, will inform MD

## 2015-10-28 NOTE — ED Provider Notes (Addendum)
Patient is 60 year old with past history significant for end-stage renal disease on dialysis presenting with right-sided weakness. Patient will unclear about his story, but it sounds like last known normal was at 3 AM. Patient has  inconsistent exam. Patient initially called a code stroke. Seen by neurology. They think is exam is non-localizable. There recommended MRI, if negative they believe patient is safe for outpatient workup.  Otherwise patient has had normal vital signs. We will workup any other cause of weakness such as occult pneumonia, electrolyte abnormalities.  MRI showed infarct that was old, but unknown to patient.  Will admit for TIA and for medical maximization to reduce future stroke.     EKG Interpretation  Date/Time:  Friday October 28 2015 15:42:23 EST Ventricular Rate:  101 PR Interval:  142 QRS Duration: 106 QT Interval:  363 QTC Calculation: 470 R Axis:   89 Text Interpretation:  Sinus tachycardia Probable left atrial enlargement Left ventricular hypertrophy Anterior Q waves, possibly due to LVH no acute ischemia No significant change since last tracing Confirmed by Gerald Leitz ([id number]) on 10/28/2015 11:47:41 PM        Hobson Lax Julio Alm, MD 10/28/15 [id number]

## 2015-10-28 NOTE — ED Notes (Signed)
Admitting doctor at the bedisde

## 2015-10-28 NOTE — ED Notes (Signed)
Pt notified of need for urine. Pt states he no longer makes urine. RN notified.

## 2015-10-29 ENCOUNTER — Inpatient Hospital Stay (HOSPITAL_COMMUNITY): Payer: Medicare Other

## 2015-10-29 DIAGNOSIS — R29898 Other symptoms and signs involving the musculoskeletal system: Secondary | ICD-10-CM | POA: Insufficient documentation

## 2015-10-29 DIAGNOSIS — N186 End stage renal disease: Secondary | ICD-10-CM

## 2015-10-29 DIAGNOSIS — I10 Essential (primary) hypertension: Secondary | ICD-10-CM

## 2015-10-29 DIAGNOSIS — Z992 Dependence on renal dialysis: Secondary | ICD-10-CM

## 2015-10-29 DIAGNOSIS — I519 Heart disease, unspecified: Secondary | ICD-10-CM

## 2015-10-29 DIAGNOSIS — E8779 Other fluid overload: Secondary | ICD-10-CM

## 2015-10-29 DIAGNOSIS — E119 Type 2 diabetes mellitus without complications: Secondary | ICD-10-CM

## 2015-10-29 DIAGNOSIS — M6289 Other specified disorders of muscle: Secondary | ICD-10-CM

## 2015-10-29 DIAGNOSIS — Z794 Long term (current) use of insulin: Secondary | ICD-10-CM

## 2015-10-29 LAB — LIPID PANEL
Cholesterol: 135 mg/dL (ref 0–200)
HDL: 17 mg/dL — ABNORMAL LOW (ref >40)
LDL CALC: 65 mg/dL (ref 0–99)
Total CHOL/HDL Ratio: 7.9 RATIO
Triglycerides: 267 mg/dL — ABNORMAL HIGH (ref <150)
VLDL: 53 mg/dL — ABNORMAL HIGH (ref 0–40)

## 2015-10-29 LAB — CBC
HEMATOCRIT: 36.9 % — AB (ref 39.0–52.0)
HEMOGLOBIN: 11.7 g/dL — AB (ref 13.0–17.0)
MCH: 26.8 pg (ref 26.0–34.0)
MCHC: 31.7 g/dL (ref 30.0–36.0)
MCV: 84.6 fL (ref 78.0–100.0)
Platelets: 230 10*3/uL (ref 150–400)
RBC: 4.36 MIL/uL (ref 4.22–5.81)
RDW: 16.7 % — ABNORMAL HIGH (ref 11.5–15.5)
WBC: 8.1 10*3/uL (ref 4.0–10.5)

## 2015-10-29 LAB — GLUCOSE, CAPILLARY
GLUCOSE-CAPILLARY: 94 mg/dL (ref 65–99)
GLUCOSE-CAPILLARY: 88 mg/dL (ref 65–99)
GLUCOSE-CAPILLARY: 130 mg/dL — AB (ref 65–99)
GLUCOSE-CAPILLARY: 108 mg/dL — AB (ref 65–99)

## 2015-10-29 LAB — RENAL FUNCTION PANEL
ALBUMIN: 3.3 g/dL — AB (ref 3.5–5.0)
ANION GAP: 15 (ref 5–15)
BUN: 27 mg/dL — AB (ref 6–20)
CO2: 24 mmol/L (ref 22–32)
Calcium: 9.5 mg/dL (ref 8.9–10.3)
Chloride: 99 mmol/L — ABNORMAL LOW (ref 101–111)
Creatinine, Ser: 9.92 mg/dL — ABNORMAL HIGH (ref 0.61–1.24)
GFR calc Af Amer: 6 mL/min — ABNORMAL LOW (ref >60)
GFR, EST NON AFRICAN AMERICAN: 5 mL/min — AB (ref >60)
Glucose, Bld: 91 mg/dL (ref 65–99)
PHOSPHORUS: 7.7 mg/dL — AB (ref 2.5–4.6)
POTASSIUM: 4.2 mmol/L (ref 3.5–5.1)
Sodium: 138 mmol/L (ref 135–145)

## 2015-10-29 LAB — CBG MONITORING, ED: GLUCOSE-CAPILLARY: 87 mg/dL (ref 65–99)

## 2015-10-29 MED ORDER — INSULIN ASPART 100 UNIT/ML ~~LOC~~ SOLN: 0.0000 [IU] | Freq: Every day | SUBCUTANEOUS | Status: DC

## 2015-10-29 MED ORDER — HEPARIN SODIUM (PORCINE) 5000 UNIT/ML IJ SOLN
5000.0000 [IU] | Freq: Three times a day (TID) | INTRAMUSCULAR | Status: DC
  Administered 2015-10-29 – 2015-10-31 (×7): 5000 [IU] via SUBCUTANEOUS
  Filled 2015-10-29 (×6): qty 1

## 2015-10-29 MED ORDER — CINACALCET HCL 30 MG PO TABS
90.0000 mg | ORAL_TABLET | Freq: Every day | ORAL | Status: DC
  Administered 2015-10-29 – 2015-10-31 (×3): 90 mg via ORAL
  Filled 2015-10-29 (×5): qty 3

## 2015-10-29 MED ORDER — VENLAFAXINE HCL ER 75 MG PO CP24
75.0000 mg | ORAL_CAPSULE | Freq: Every day | ORAL | Status: DC
  Administered 2015-10-29 – 2015-10-31 (×3): 75 mg via ORAL
  Filled 2015-10-29 (×3): qty 1

## 2015-10-29 MED ORDER — CARVEDILOL 12.5 MG PO TABS
50.0000 mg | ORAL_TABLET | Freq: Two times a day (BID) | ORAL | Status: DC
  Administered 2015-10-29: 50 mg via ORAL
  Filled 2015-10-29: qty 4

## 2015-10-29 MED ORDER — SUCROFERRIC OXYHYDROXIDE 500 MG PO CHEW
1000.0000 mg | CHEWABLE_TABLET | Freq: Three times a day (TID) | ORAL | Status: DC
  Administered 2015-10-29 – 2015-10-31 (×4): 1000 mg via ORAL
  Filled 2015-10-29 (×9): qty 2

## 2015-10-29 MED ORDER — MECLIZINE HCL 12.5 MG PO TABS: 25.0000 mg | ORAL_TABLET | Freq: Three times a day (TID) | ORAL | Status: DC | PRN

## 2015-10-29 MED ORDER — INSULIN ASPART 100 UNIT/ML ~~LOC~~ SOLN
0.0000 [IU] | SUBCUTANEOUS | Status: DC
  Administered 2015-10-29: 1 [IU] via SUBCUTANEOUS

## 2015-10-29 MED ORDER — CARVEDILOL 12.5 MG PO TABS
25.0000 mg | ORAL_TABLET | Freq: Two times a day (BID) | ORAL | Status: DC
  Administered 2015-10-29 – 2015-10-30 (×2): 25 mg via ORAL
  Filled 2015-10-29 (×2): qty 2

## 2015-10-29 MED ORDER — ISOSORBIDE MONONITRATE ER 30 MG PO TB24
30.0000 mg | ORAL_TABLET | Freq: Every day | ORAL | Status: DC
  Administered 2015-10-29: 30 mg via ORAL
  Filled 2015-10-29: qty 1

## 2015-10-29 MED ORDER — SENNOSIDES-DOCUSATE SODIUM 8.6-50 MG PO TABS: 1.0000 | ORAL_TABLET | Freq: Every evening | ORAL | Status: DC | PRN

## 2015-10-29 MED ORDER — HYDRALAZINE HCL 20 MG/ML IJ SOLN
10.0000 mg | INTRAMUSCULAR | Status: DC | PRN
  Administered 2015-10-29: 10 mg via INTRAVENOUS
  Filled 2015-10-29 (×2): qty 1

## 2015-10-29 MED ORDER — STROKE: EARLY STAGES OF RECOVERY BOOK
Freq: Once | Status: AC
  Administered 2015-10-29: 07:00:00

## 2015-10-29 MED ORDER — ACETAMINOPHEN 500 MG PO TABS
500.0000 mg | ORAL_TABLET | Freq: Four times a day (QID) | ORAL | Status: DC | PRN
  Administered 2015-10-29 – 2015-10-30 (×3): 500 mg via ORAL
  Filled 2015-10-29 (×2): qty 1

## 2015-10-29 MED ORDER — SODIUM CHLORIDE 0.9 % IV SOLN: 100.0000 mL | INTRAVENOUS | Status: DC | PRN

## 2015-10-29 MED ORDER — AMLODIPINE BESYLATE 10 MG PO TABS
10.0000 mg | ORAL_TABLET | Freq: Every day | ORAL | Status: DC
  Administered 2015-10-29 – 2015-10-31 (×3): 10 mg via ORAL
  Filled 2015-10-29 (×3): qty 1

## 2015-10-29 MED ORDER — HEPARIN SODIUM (PORCINE) 1000 UNIT/ML DIALYSIS: 8000.0000 [IU] | Freq: Once | INTRAMUSCULAR | Status: DC

## 2015-10-29 MED ORDER — SEVELAMER CARBONATE 800 MG PO TABS
3200.0000 mg | ORAL_TABLET | Freq: Three times a day (TID) | ORAL | Status: DC
  Administered 2015-10-29 – 2015-10-31 (×6): 3200 mg via ORAL
  Filled 2015-10-29 (×6): qty 4

## 2015-10-29 MED ORDER — PANTOPRAZOLE SODIUM 40 MG PO TBEC
40.0000 mg | DELAYED_RELEASE_TABLET | Freq: Every day | ORAL | Status: DC
  Administered 2015-10-29 – 2015-10-31 (×3): 40 mg via ORAL
  Filled 2015-10-29 (×3): qty 1

## 2015-10-29 MED ORDER — LABETALOL HCL 5 MG/ML IV SOLN: 20.0000 mg | Freq: Once | INTRAVENOUS | Status: DC

## 2015-10-29 MED ORDER — LIDOCAINE-PRILOCAINE 2.5-2.5 % EX CREA: 1.0000 "application " | TOPICAL_CREAM | CUTANEOUS | Status: DC | PRN

## 2015-10-29 MED ORDER — PENTAFLUOROPROP-TETRAFLUOROETH EX AERO: 1.0000 "application " | INHALATION_SPRAY | CUTANEOUS | Status: DC | PRN

## 2015-10-29 MED ORDER — ISOSORBIDE MONONITRATE ER 30 MG PO TB24: 60.0000 mg | ORAL_TABLET | Freq: Every day | ORAL | Status: DC

## 2015-10-29 MED ORDER — RENA-VITE PO TABS
1.0000 | ORAL_TABLET | Freq: Every day | ORAL | Status: DC
  Administered 2015-10-29 – 2015-10-30 (×2): 1 via ORAL
  Filled 2015-10-29 (×2): qty 1

## 2015-10-29 MED ORDER — LIDOCAINE HCL (PF) 1 % IJ SOLN: 5.0000 mL | INTRAMUSCULAR | Status: DC | PRN

## 2015-10-29 MED ORDER — SIMETHICONE 80 MG PO CHEW: 80.0000 mg | CHEWABLE_TABLET | Freq: Four times a day (QID) | ORAL | Status: DC | PRN

## 2015-10-29 MED ORDER — SEVELAMER CARBONATE 800 MG PO TABS
2400.0000 mg | ORAL_TABLET | Freq: Three times a day (TID) | ORAL | Status: DC
  Administered 2015-10-29: 2400 mg via ORAL
  Filled 2015-10-29: qty 3

## 2015-10-29 MED ORDER — LOSARTAN POTASSIUM 50 MG PO TABS
100.0000 mg | ORAL_TABLET | Freq: Every day | ORAL | Status: DC
  Administered 2015-10-29 – 2015-10-30 (×2): 100 mg via ORAL
  Filled 2015-10-29 (×2): qty 2

## 2015-10-29 MED ORDER — HYDRALAZINE HCL 20 MG/ML IJ SOLN
10.0000 mg | Freq: Once | INTRAMUSCULAR | Status: AC
  Administered 2015-10-29: 10 mg via INTRAVENOUS
  Filled 2015-10-29: qty 1

## 2015-10-29 MED ORDER — ATORVASTATIN CALCIUM 10 MG PO TABS
20.0000 mg | ORAL_TABLET | Freq: Every day | ORAL | Status: DC
  Administered 2015-10-29 – 2015-10-30 (×2): 20 mg via ORAL
  Filled 2015-10-29 (×2): qty 2

## 2015-10-29 MED ORDER — NITROGLYCERIN 0.4 MG SL SUBL: 0.4000 mg | SUBLINGUAL_TABLET | SUBLINGUAL | Status: DC | PRN

## 2015-10-29 MED ORDER — ASPIRIN 81 MG PO CHEW
81.0000 mg | CHEWABLE_TABLET | Freq: Every day | ORAL | Status: DC
  Administered 2015-10-29 – 2015-10-31 (×3): 81 mg via ORAL
  Filled 2015-10-29 (×3): qty 1

## 2015-10-29 MED ORDER — PREGABALIN 25 MG PO CAPS
50.0000 mg | ORAL_CAPSULE | Freq: Every day | ORAL | Status: DC
  Administered 2015-10-29 – 2015-10-31 (×3): 50 mg via ORAL
  Filled 2015-10-29 (×3): qty 2

## 2015-10-29 MED ORDER — INSULIN ASPART 100 UNIT/ML ~~LOC~~ SOLN
0.0000 [IU] | Freq: Three times a day (TID) | SUBCUTANEOUS | Status: DC
  Administered 2015-10-30: 2 [IU] via SUBCUTANEOUS
  Administered 2015-10-31: 1 [IU] via SUBCUTANEOUS

## 2015-10-29 MED ORDER — CLOPIDOGREL BISULFATE 75 MG PO TABS
75.0000 mg | ORAL_TABLET | Freq: Every day | ORAL | Status: DC
Start: 2015-10-29 — End: 2015-10-31
  Administered 2015-10-29 – 2015-10-31 (×3): 75 mg via ORAL
  Filled 2015-10-29 (×3): qty 1

## 2015-10-29 MED ORDER — AMITRIPTYLINE HCL 25 MG PO TABS
100.0000 mg | ORAL_TABLET | Freq: Every day | ORAL | Status: DC
  Administered 2015-10-29 – 2015-10-30 (×2): 100 mg via ORAL
  Filled 2015-10-29 (×2): qty 4

## 2015-10-29 MED ORDER — ONDANSETRON HCL 4 MG/2ML IJ SOLN
4.0000 mg | Freq: Four times a day (QID) | INTRAMUSCULAR | Status: DC | PRN
  Administered 2015-10-29 – 2015-10-30 (×2): 4 mg via INTRAVENOUS
  Filled 2015-10-29 (×2): qty 2

## 2015-10-29 MED ORDER — ACETAMINOPHEN 325 MG PO TABS
ORAL_TABLET | ORAL | Status: AC
  Filled 2015-10-29: qty 2

## 2015-10-29 NOTE — ED Notes (Signed)
To mri again

## 2015-10-29 NOTE — ED Notes (Signed)
Mri just called he is now going for a mri of his c-spine. Just ordered

## 2015-10-29 NOTE — Consult Note (Signed)
Speech Language Pathology Evaluation Patient Details Name: [name] [name] MRN: [id number] DOB: 04-02-1956 Today's Date: 10/29/2015 Time: [id number] SLP Time Calculation (min) (ACUTE ONLY): 12 min  Problem List:  Patient Active Problem List   Diagnosis Date Noted  . Arm weakness   . Right leg weakness   . ESRD on hemodialysis ([place]) 10/28/2015  . Acute right-sided weakness 10/28/2015  . Right sided weakness 10/28/2015  . Atypical chest pain 08/28/2015  . Diabetic neuropathy ([place]) 07/29/2015  . Diabetic polyneuropathy associated with type 2 diabetes mellitus ([place]) 07/29/2015  . Depression 07/22/2015  . Pulmonary edema 06/13/2015  . SOB (shortness of breath) 06/13/2015  . Pain in the chest   . Chronic respiratory failure ([place]) 06/05/2015  . Elevated troponin 06/04/2015  . GERD (gastroesophageal reflux disease) 06/04/2015  . Costochondritis   . Malnutrition of moderate degree ([place]) 05/17/2015  . Thrombocytopenia ([place]) 05/16/2015  . Hypertensive urgency 05/16/2015  . Weight loss 05/16/2015  . Angina of effort ([place]) 05/16/2015  . Left-sided weakness 04/01/2015  . H/O TIA (transient ischemic attack) 04/01/2015  . Intractable nausea and vomiting 12/13/2014  . Diastolic dysfunction-grade 2 12/13/2014  . Volume overload 11/09/2014  . Hyperphosphatemia 11/09/2014  . Hypertension 04/27/2014  . CAD -S/P LAD BMS 2011, LAD DES 2012- patent cors Feb 2016 04/27/2014  . Diabetes mellitus type 2, insulin dependent ([place]) 04/27/2014  . Chest pain, atypical 04/26/2014  . ESRD (end stage renal disease) ([place]) 04/26/2014   Past Medical History:  Past Medical History  Diagnosis Date  . Hypertension   . Hematochezia     a. 2014: colonscopy, which showed moderately-sized internal hemorrhoids, two 74mm polyps in transverse colon and ascending colon that were resected, five 2-56mm polyps in sigmoid colon, descending colon, transverse colon, and ascending colon that were resected. An upper endoscopy was  performed and showed normal esophagus, stomach, and duodenum.  . Hematuria     a. H/o hematuria 2014 with cystoscopy that was unrevealing for his source of hematuria. He underwent a kidney ultrasound on 10/14 that showed mildly echogenic and scarred kidneys compatible with medical renal disease, without hydronephrosis or renal calculi.  [name] Anemia   . CAD (coronary artery disease)     a. per [facility] s/p 3.59mm x 42mm Vision BMS to mid LAD 12/2009 and Xience DES to mid LAD 10/2010.  . Colon polyps   . Chronic diastolic CHF (congestive heart failure) ([place])   . Hyperlipidemia   . Anginal pain ([place])   . Heart murmur   . 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 Type II diabetes mellitus ([place])   . History of blood transfusion     "had colonoscopy done; they had to give me some blood"  . Daily headache   . ESRD on dialysis [facility]) since ~ 2008    "[place]; TTS" (07/21/2015)  . On home oxygen therapy     "2L prn" (07/21/2015)  . Renal insufficiency    Past Surgical History:  Past Surgical History  Procedure Laterality Date  . Left heart catheterization with coronary angiogram N/A 11/23/2014    Procedure: LEFT HEART CATHETERIZATION WITH CORONARY ANGIOGRAM;  Surgeon: Troy Sine, MD;  Location: [facility] CATH LAB;  Service: Cardiovascular;  Laterality: N/A;  . Lithotripsy  X1  . Cystoscopy w/ stone manipulation  X2?  . Cardiac catheterization  "several"  . Coronary angioplasty with stent placement  "several"  . Eye surgery Bilateral     "  laser OR for hemorrhage"  . Av fistula placement Left ~ 2007    "upper arm"   HPI:  [name] [name] is a 60 year old right-hand dominant male with past medical history significant for ESRD on HD (T/TH/Sat), HTN, HLD, DM type 2, CAD s/p stenting, chronic CHF; who presents with complaints of right arm and leg weakness. Patient notes that he woke up around 3 AM as he normally does to have his morning coffee and felt somewhat weak on his  right side. He laid back down and states that symptoms waxed and waned in intensity throughout the early morning. However at 1 PM 1 symptoms not improving he notified family to bring him to the hospital for further evaluation. He had hemodialysis yesterday but notes that it hurts to take a deep breath in. Denies any fever, chills, headache, neck pain, or new vision changes. He notes he fell a couple weeks ago, but did not hit his head after losing his balance when he stood too quickly.  Upon admission to the emergency department patient states that the right leg lower extremity weakness has improved some, but the right upper extremity weakness is unchanged. He was evaluated with a CT of the brain which showed no acute abnormalities. Neurology evaluated the patient and though this was possibly a functional weakness and ordered a MRI. MRI showed a possible abnormality in the cervical spine, but no acute infarct.   Assessment / Plan / Recommendation Clinical Impression  Cognitive/linguistic evaluation was completed.  The patient achieved a score of 23/30 on the Mini Mental State Exam.  He was oriented x 4, had good immediate recall, was able to name objects, repeat and write a simple sentence.  Mild difficulty was noticed on copying a design, recall of 3 novel items given a delay (2/3 independently, 3/3 with semantic cue) and with spelling WORLD backwards.  The patient reported that he has long standing difficulty spelling.  Motor speech deficits were not observed.  The patient appeared to be at baseline.  He lives with his son and daughter in law and only has light housekeeping repsonsibilities at home.  Acute ST needs are not identified.      SLP Assessment  Patient does not need any further Speech Lanaguage Pathology Services    Follow Up Recommendations  None          SLP Evaluation Prior Functioning  Cognitive/Linguistic Baseline: Within functional limits Type of Home: Apartment  Lives With:  Son Available Help at Discharge: Available PRN/intermittently;Family Education: 11th grade. Vocation: On disability   Cognition  Overall Cognitive Status: Within Functional Limits for tasks assessed Arousal/Alertness: Awake/alert Orientation Level: Oriented X4 Attention: Focused Focused Attention: Appears intact Memory: Impaired Memory Impairment: Retrieval deficit (Only able to recall 2/3 novel items given delay.  ) Awareness: Appears intact Problem Solving: Appears intact Safety/Judgment: Appears intact    Comprehension  Auditory Comprehension Yes/No Questions: Within Functional Limits Commands: Within Functional Limits Conversation: Complex Reading Comprehension Reading Status: Within funtional limits    Expression Expression Primary Mode of Expression: Verbal Verbal Expression Overall Verbal Expression: Appears within functional limits for tasks assessed Initiation: No impairment Automatic Speech: Name;Social Response Level of Generative/Spontaneous Verbalization: Conversation Repetition: No impairment Naming: No impairment Pragmatics: No impairment Non-Verbal Means of Communication: Not applicable Written Expression Dominant Hand: Right Written Expression: Within Functional Limits   Oral / Motor  Oral Motor/Sensory Function Overall Oral Motor/Sensory Function: Within functional limits Motor Speech Overall Motor Speech: Appears within functional limits for tasks assessed  Respiration: Within functional limits Phonation: Normal Resonance: Within functional limits Articulation: Within functional limitis Intelligibility: Intelligible Motor Planning: Witnin functional limits Motor Speech Errors: Not applicable   GO          Functional Assessment Tool Used: ASHA NOMS Functional Limitations: Spoken language expressive Spoken Language Expression Current Status [phone]): At least 1 percent but less than 20 percent impaired, limited or restricted Spoken Language  Expression Goal Status [phone]): At least 1 percent but less than 20 percent impaired, limited or restricted Spoken Language Expression Discharge Status [phone]): At least 1 percent but less than 20 percent impaired, limited or restricted         Shelly Flatten, [place], [place] [phone] Lamar Sprinkles 10/29/2015, 11:39 AM

## 2015-10-29 NOTE — ED Notes (Signed)
The pt has returned from [facility].  He is c/o a headache  Nasal 0w2 lying on the stretcher.   sats 94% on room air

## 2015-10-29 NOTE — ED Notes (Signed)
Report called to the floor   But thed bp is too high for them and they want the pt not telemetry.  Page placed for the admitting doctor

## 2015-10-29 NOTE — ED Notes (Signed)
The pt  Has been given hydralazine iv 10mg  for bp  Dr Tamala Julian reports that the pt just needs dialysis.  The pt has been using his rt arm normally holding the vomit bag coughing into it  .

## 2015-10-29 NOTE — Progress Notes (Signed)
Pt arrived from ED with stroke symptoms, alert and oriented, denies any pain,pt settled in bed with call light within pt's reach, will however continue to monitor. [place]-As[place], Xiomar Crompton Efe

## 2015-10-29 NOTE — Procedures (Signed)
  I was present at this dialysis session, have reviewed the session itself and made  appropriate changes Kelly Splinter MD [place] pager [phone]    cell [phone] 10/29/2015, 4:55 PM

## 2015-10-29 NOTE — ED Notes (Signed)
Verbal order received from dr Tamala Julian for a now dose of hydralazine 10 mg

## 2015-10-29 NOTE — Progress Notes (Signed)
[facility]                                                                              Patient Demographics  [name] [name], is a 60 y.o. male, DOB - 11-02-1955, UA:[id number]  Admit date - 10/28/2015   Admitting Physician [name] Morton, MD  Outpatient Primary MD for the patient is [name]  LOS - 1   Chief Complaint  Patient presents with  . Code Stroke       Brief HPI   Patient is a 60 year old maleESRD on HD (T/TH/Sat), HTN, HLD, DM type 2, CAD s/p stenting, chronic CHF; who presented with complaints of right arm and leg weakness. Patient reported that he woke up around 3 AM and felt weaker on his right side. In ED, CT of the brain showed no acute abnormalities. Neurology was consulted and per recommendation likely functional weakness. MRI brain showed no acute infarct. Of note, patient's BP has been also elevated since admission, 181/105 in ED.   Assessment & Plan   Principal Problem: Acute right-sided weakness:  - Unclear etiology, patient was brought in as code stroke, seen by neurology, Dr. Aram [name] who felt strongly that patient had functional component on exam.  - MRI of the brain showed no acute infarct - Follow with PT OT evaluation - Patient could have right-sided weakness due to malignant hypertension. - MRI C-spine showed mild to moderate cervical spine disc degenerative disease  ESRD on hemodialysis: TTS - Nephrology consulted, discussed with [name] [name], will need hemodialysis today - Continue Sensipar, Renvela  Diabetes mellitus type 2: Last hemoglobin A1c was 6 in August 2016. - CBGs q4 hr with sensitive SSI  Hyp malignant hypertension: Uncontrolled. Systolic blood pressures over 200 at times over night. -Continue hydralazine as needed, labetalol 20 mg IV 1 - Restarted on amlodipine, Coreg, increase Imdur - Dialysis will help with BP  Coronary artery disease s/p percutaneous intervention - Continue aspirin,  Plavix  Systolic congestive heart failure EF 45-50% on echo in 05/2015 with grade 2 diastolic dysfunction - Continue isosorbide mononitrate - Volume control with HD  GERD - Protonix  Code Status: Full code  Family Communication: Discussed in detail with the patient, all imaging results, lab results explained to the patient    Disposition Plan:  Time Spent in minutes  25 minutes  Procedures  MRI brain MRI C-spine  Consults   Neurology  DVT Prophylaxis  heparin  Medications  Scheduled Meds: . amitriptyline  100 mg Oral QHS  . amLODipine  10 mg Oral Daily  . aspirin  81 mg Oral Daily  . atorvastatin  20 mg Oral QHS  . carvedilol  50 mg Oral BID WC  . cinacalcet  90 mg Oral Q breakfast  . clopidogrel  75 mg Oral Daily  . heparin  5,000 Units Subcutaneous 3 times per day  . insulin aspart  0-9 Units Subcutaneous 6 times per day  . isosorbide mononitrate  30 mg Oral Daily  . labetalol  20 mg Intravenous Once  . multivitamin  1 tablet Oral QHS  . pantoprazole  40 mg Oral Daily  .  pregabalin  50 mg Oral Daily  . sevelamer carbonate  2,400 mg Oral TID WC  . venlafaxine XR  75 mg Oral Q breakfast   Continuous Infusions:  PRN Meds:.acetaminophen, hydrALAZINE, meclizine, nitroGLYCERIN, ondansetron (ZOFRAN) IV, senna-docusate, simethicone   Antibiotics   Anti-infectives    None        Subjective:   [name] [name] was seen and examined today.  Continues to complain of right-sided weakness. No headache or blurry vision. No slurred speech. Patient denies dizziness, chest pain, shortness of breath, abdominal pain, N/V/D/C.[name] No acute events overnight.    Objective:   Blood pressure 184/97, pulse 108, temperature 98.4 F (36.9 C), temperature source Oral, resp. rate 18, height 6' (1.829 m), weight 94.858 kg (209 lb 2 oz), SpO2 95 %.  Wt Readings from Last 3 Encounters:  10/29/15 94.858 kg (209 lb 2 oz)  10/18/15 95.346 kg (210 lb 3.2 oz)  08/29/15 92.171 kg (203 lb  3.2 oz)     Intake/Output Summary (Last 24 hours) at 10/29/15 1144 Last data filed at 10/29/15 0700  Gross per 24 hour  Intake    240 ml  Output      0 ml  Net    240 ml    Exam  General: Alert and oriented x 3, NAD  HEENT:  PERRLA, EOMI, Anicteric Sclera, mucous membranes moist.   Neck: Supple, no JVD, no masses  CVS: S1 S2 auscultated, no rubs, murmurs or gallops. Regular rate and rhythm.  Respiratory: Clear to auscultation bilaterally, no wheezing, rales or rhonchi  Abdomen: Soft, nontender, nondistended, + bowel sounds  Ext: no cyanosis clubbing or edema  Neuro: AAOx3, Cr N's II- XII. Strength 5/5 on left, right arm and leg weakness ?  Skin: No rashes  Psych: Normal affect and demeanor, alert and oriented x3    Data Review   Micro Results No results found for this or any previous visit (from the past 240 hour(s)).  Radiology Reports Dg Chest 2 View  10/28/2015  CLINICAL DATA:  Right arm and leg weakness. EXAM: CHEST  2 VIEW COMPARISON:  October 18, 2015. FINDINGS: Stable cardiomediastinal silhouette. Mild central pulmonary vascular congestion is noted with bilateral pulmonary edema. No pneumothorax or pleural effusion is noted. Bony thorax is unremarkable. IMPRESSION: Mild central pulmonary vascular congestion with bilateral pulmonary edema. Electronically Signed   By: [name] [name], M.D.   On: 10/28/2015 16:30   Dg Chest 2 View  10/18/2015  CLINICAL DATA:  Chest pain and shortness of breath for 2 days EXAM: CHEST - 2 VIEW COMPARISON:  09/18/2015 FINDINGS: Cardiac shadow is mildly enlarged but stable. The lungs are well aerated bilaterally. Mild central congestion is noted. No interstitial edema is noted. No effusion or infiltrate is seen. IMPRESSION: Mild central vascular congestion. No other focal abnormality is seen. Electronically Signed   By: [name] [name] M.D.   On: 10/18/2015 13:00   Ct Head Wo Contrast  10/28/2015  CLINICAL DATA:  Right-sided weakness  today EXAM: CT HEAD WITHOUT CONTRAST TECHNIQUE: Contiguous axial images were obtained from the base of the skull through the vertex without intravenous contrast. COMPARISON:  04/01/2015 FINDINGS: No skull fracture. No significant inflammatory change in the sinuses. No hemorrhage or extra-axial fluid. No hydrocephalus. Mild atrophy. Moderate diffuse low attenuation in the deep white matter. No evidence of mass or vascular territory infarct. IMPRESSION: Stable chronic involutional change with no acute findings Electronically Signed   By: Skipper Cliche M.D.   On: 10/28/2015  15:38   Mr Herby Abraham Contrast  10/28/2015  CLINICAL DATA:  60 year old diabetic hypertensive male on dialysis with hyperlipidemia presenting with right-sided weakness and headache. Subsequent encounter. EXAM: MRI HEAD WITHOUT CONTRAST TECHNIQUE: Multiplanar, multiecho pulse sequences of the brain and surrounding structures were obtained without intravenous contrast. COMPARISON:  10/28/2015 CT and 04/01/2015 MR FINDINGS: No acute infarct, intracranial hemorrhage or hydrocephalus. Remote infarct of the thalamus bilaterally. Remote posterior right frontal lobe smaller infarct. Prominent small vessel disease changes most notable periventricular region and within the pons. No intracranial mass lesion noted on this unenhanced exam. Major intracranial vascular structures are patent. Moderate exophthalmos. Symmetric normal appearance of the extra-ocular muscles. Question abnormality within the cord at the C3 level. Cervical spine MR can be obtained for further delineation. Cervical medullary junction, pituitary region and fine-needle region within normal limits. IMPRESSION: No acute infarct or hydrocephalus. Remote infarct of the thalamus bilaterally. Remote posterior right frontal lobe smaller infarct. Prominent small vessel disease changes most notable periventricular region and within the pons. No intracranial mass lesion noted on this unenhanced  exam. Moderate exophthalmos. Symmetric normal appearance of the extra-ocular muscles. Question abnormality within the cord at the C3 level. Cervical spine MR can be obtained for further delineation. Electronically Signed   By: Genia Del M.D.   On: 10/28/2015 20:12   Mr Cervical Spine Wo Contrast  10/29/2015  CLINICAL DATA:  Initial evaluation for right-sided weakness. EXAM: MRI CERVICAL SPINE WITHOUT CONTRAST TECHNIQUE: Multiplanar, multisequence MR imaging of the cervical spine was performed. No intravenous contrast was administered. COMPARISON:  None. FINDINGS: Visualized portions of the brain demonstrate no acute abnormality. T2 hyperintensity within the pons like related to chronic small vessel ischemic changes. Foramen magnum widely patent. Vertebral bodies are normally aligned with preservation of the normal cervical lordosis. Vertebral body heights well preserved. No fracture or listhesis. Diffusely decreased T1 signal intensity within the visualized vertebral body bone marrow, which may related to underlying anemia. No focal osseous lesion. No marrow edema. Signal intensity within the cervical spinal cord is normal. Previously questioned abnormality at the level of C3 not seen on this exam, this was likely artifactual on previous study. Paraspinous soft tissues demonstrate no acute abnormality. C2-C3: Mild bilateral uncovertebral hypertrophy. No significant stenosis. C3-C4: Bilateral uncovertebral spurring with minimal disc bulge. Mild bilateral foraminal stenosis. No significant canal narrowing. C4-C5: Bilateral uncovertebral spurring. Broad-based right paracentral disc bulge. Resultant moderate right foraminal narrowing. No significant canal or left foraminal stenosis. Mild bilateral facet arthrosis. C5-C6: Bilateral uncovertebral spurring with mild disc bulge. Moderate right with more mild left foraminal narrowing. Posterior disc bulge minimally indents the ventral thecal sac without significant  stenosis. C6-C7: Mild bilateral uncovertebral hypertrophy and facet arthrosis. Resultant moderate right foraminal narrowing. No significant left neural foraminal stenosis. Central canal widely patent. C7-T1: Mild bilateral uncovertebral hypertrophy. Minimal disc bulge. No significant stenosis. Visualized upper thoracic spine within normal limits. IMPRESSION: 1. No acute abnormality within the cervical spine. 2. Mild to moderate multilevel degenerative disc disease as above. There is resultant moderate right foraminal narrowing at C4-5, C5-6, and C6-7 with more mild right foraminal narrowing at C3-4. Mild left foraminal narrowing at C3-4. No significant canal stenosis. 3. Diffusely decreased T1 signal intensity throughout the visualized bone marrow, nonspecific, but may be related to patient's underlying anemia. Electronically Signed   By: Jeannine Boga M.D.   On: 10/29/2015 02:52    CBC  Recent Labs Lab 10/28/15 1522 10/28/15 1528 10/29/15 0640  WBC 8.0  --  8.1  HGB 12.0* 13.9 11.7*  HCT 38.5* 41.0 36.9*  PLT 196  --  230  MCV 86.3  --  84.6  MCH 26.9  --  26.8  MCHC 31.2  --  31.7  RDW 16.6*  --  16.7*  LYMPHSABS 1.0  --   --   MONOABS 0.8  --   --   EOSABS 0.2  --   --   BASOSABS 0.0  --   --     Chemistries   Recent Labs Lab 10/28/15 1522 10/28/15 1528 10/29/15 0640  NA 138 137 138  K 4.5 4.6 4.2  CL 97* 102 99*  CO2 22  --  24  GLUCOSE 102* 102* 91  BUN 22* 29* 27*  CREATININE 8.66* 8.20* 9.92*  CALCIUM 9.8  --  9.5  AST 11*  --   --   ALT 7*  --   --   ALKPHOS 86  --   --   BILITOT 0.6  --   --    ------------------------------------------------------------------------------------------------------------------ estimated creatinine clearance is 9.8 mL/min (by C-G formula based on Cr of 9.92). ------------------------------------------------------------------------------------------------------------------ No results for input(s): HGBA1C in the last 72  hours. ------------------------------------------------------------------------------------------------------------------  Recent Labs  10/29/15 0640  CHOL 135  HDL 17*  LDLCALC 65  TRIG 267*  CHOLHDL 7.9   ------------------------------------------------------------------------------------------------------------------ No results for input(s): TSH, T4TOTAL, T3FREE, THYROIDAB in the last 72 hours.  Invalid input(s): FREET3 ------------------------------------------------------------------------------------------------------------------ No results for input(s): VITAMINB12, FOLATE, FERRITIN, TIBC, IRON, RETICCTPCT in the last 72 hours.  Coagulation profile  Recent Labs Lab 10/28/15 1522  INR 1.13    No results for input(s): DDIMER in the last 72 hours.  Cardiac Enzymes No results for input(s): CKMB, TROPONINI, MYOGLOBIN in the last 168 hours.  Invalid input(s): CK ------------------------------------------------------------------------------------------------------------------ Invalid input(s): POCBNP   Recent Labs  10/29/15 0320 10/29/15 0650  GLUCAP 87 26     Brytnee Bechler M.D. [facility] 10/29/2015, 11:44 AM  Pager: [phone] Between 7am to 7pm - call Pager - [phone]  After 7pm go to [email or web address] - password [email or web address]  Call night coverage person covering after 7pm

## 2015-10-29 NOTE — ED Notes (Signed)
Pt has no 02 on at present.  He has takedn it off.  His room air  sats are 94%  His doctor gave order for bp meds 10 minutes ago.  He wants the pt to stay on tele

## 2015-10-29 NOTE — Consult Note (Signed)
[facility] Renal Consultation Note    Indication for Consultation:  Management of ESRD/hemodialysis; anemia, hypertension/volume and secondary hyperparathyroidism PCP:  HPI: [name] [name] is a 60 y.o. male with ESRD on hemodialysis TTS at [facility]. PMH significant for hypertension, DMT2, angina, chronic diastolic HF, hyperlipdemia, colon polyps, hematochezia, CAD with DES X 2 to LAD 2012, headaches, heart murmur, medical noncompliance. Wears O2 at home. Positive history of TB as child.   Patient presented to ED with C/O R sided weakness. States "I started feeling real bad last night". States he has had nausea, vomiting, chronic cough for one month, chills, no documented fever, "chest hurt from coughing". Denies vision/hearing changes, dizziness, vertigo, changes in speech, did have fall 2 weeks ago but without injury. Upon arrival to ED, patient was hypertensive, RLE weakness had improved but RUE weakness persisted. Code stroke was called.  CT of head negative. Neurology was consulted, ordered MRI for functional cause of weakness.MRI revealed Mild to moderate multilevel degenerative disc disease. CXR shows Mild central pulmonary vascular congestion with bilateral pulmonary edema. NA 138  K+ 4.2 Scr 9.92 BUN 27 HGB 11.7.  Patient has been admitted for evaluation of R sided weakness. .   [name] [name] has hemodialysis at [facility] TTS. He signs off early every treatment, never reaches EDW. Has been taking amlodipine, losartan and coreg for BP. Last in center lab values as follows: HGB 10.8 (10/27/15) Ca 9.4 C Ca 9.3 (10/06/15) Phos 11.9 (0112/17)  PTH 968 (10/06/15). He takes Renvela 800 mg 4 tabs PO TID with meals, Velphoro 2 tabs PO TID with meals, Senispar 90 mg PO daily, Hectoral 5 mcg IV q TTS.   Past Medical History  Diagnosis Date  . Hypertension   . Hematochezia     a. 2014: colonscopy, which showed moderately-sized internal hemorrhoids, two 41mm polyps in transverse colon and ascending colon  that were resected, five 2-51mm polyps in sigmoid colon, descending colon, transverse colon, and ascending colon that were resected. An upper endoscopy was performed and showed normal esophagus, stomach, and duodenum.  . Hematuria     a. H/o hematuria 2014 with cystoscopy that was unrevealing for his source of hematuria. He underwent a kidney ultrasound on 10/14 that showed mildly echogenic and scarred kidneys compatible with medical renal disease, without hydronephrosis or renal calculi.  [name] Anemia   . CAD (coronary artery disease)     a. per [facility] s/p 3.72mm x 73mm Vision BMS to mid LAD 12/2009 and Xience DES to mid LAD 10/2010.  . Colon polyps   . Chronic diastolic CHF (congestive heart failure) ([place])   . Hyperlipidemia   . Anginal pain ([place])   . Heart murmur   . 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 Type II diabetes mellitus ([place])   . History of blood transfusion     "had colonoscopy done; they had to give me some blood"  . Daily headache   . ESRD on dialysis [facility]) since ~ 2008    "[place]; TTS" (07/21/2015)  . On home oxygen therapy     "2L prn" (07/21/2015)  . Renal insufficiency    Past Surgical History  Procedure Laterality Date  . Left heart catheterization with coronary angiogram N/A 11/23/2014    Procedure: LEFT HEART CATHETERIZATION WITH CORONARY ANGIOGRAM;  Surgeon: Troy Sine, MD;  Location: [facility] CATH LAB;  Service: Cardiovascular;  Laterality: N/A;  . Lithotripsy  X1  . Cystoscopy w/  stone manipulation  X2?  . Cardiac catheterization  "several"  . Coronary angioplasty with stent placement  "several"  . Eye surgery Bilateral     "laser OR for hemorrhage"  . Av fistula placement Left ~ 2007    "upper arm"   Family History  Problem Relation Age of Onset  . Hypertension    . Bone cancer Mother   . Anuerysm Father   . Diabetes type II Daughter    Social History:  reports that he quit smoking about 4 years ago. His smoking use  included Cigarettes. He has a 4 pack-year smoking history. He has never used smokeless tobacco. He reports that he does not drink alcohol or use illicit drugs. Allergies  Allergen Reactions  . Enalapril Hives   Prior to Admission medications   Medication Sig Start Date End Date Taking? Authorizing Provider  acetaminophen (TYLENOL) 500 MG tablet Take 500 mg by mouth every 6 (six) hours as needed for headache.    Yes Historical Provider, MD  amitriptyline (ELAVIL) 100 MG tablet Take 1 tablet (100 mg total) by mouth at bedtime. 07/27/15  Yes Shela Leff, MD  amLODipine (NORVASC) 10 MG tablet Take 10 mg by mouth daily.   Yes Historical Provider, MD  aspirin 81 MG chewable tablet Chew 1 tablet (81 mg total) by mouth daily. 07/22/15  Yes Iline Oven, MD  atorvastatin (LIPITOR) 20 MG tablet Take 20 mg by mouth at bedtime.    Yes Historical Provider, MD  carvedilol (COREG) 25 MG tablet Take 50 mg by mouth 2 (two) times daily.    Yes Historical Provider, MD  cinacalcet (SENSIPAR) 90 MG tablet Take 90 mg by mouth at bedtime.    Yes Historical Provider, MD  clopidogrel (PLAVIX) 75 MG tablet Take 1 tablet (75 mg total) by mouth daily. 11/24/14  Yes Juluis Mire, MD  doxercalciferol (HECTOROL) 4 MCG/2ML injection Inject 1 mL (2 mcg total) into the vein Every Tuesday,Thursday,and Saturday with dialysis. 12/15/14  Yes Geradine Girt, DO  ferric gluconate 62.5 mg in sodium chloride 0.9 % 100 mL Inject 62.5 mg into the vein every Thursday with hemodialysis. 12/16/14  Yes Geradine Girt, DO  isosorbide mononitrate (IMDUR) 30 MG 24 hr tablet Take 1 tablet (30 mg total) by mouth daily. 07/27/15  Yes Shela Leff, MD  meclizine (ANTIVERT) 25 MG tablet Take 25 mg by mouth 3 (three) times daily as needed for dizziness.   Yes Historical Provider, MD  multivitamin (RENA-VIT) TABS tablet Take 1 tablet by mouth at bedtime. 12/15/14  Yes Geradine Girt, DO  nitroGLYCERIN (NITROSTAT) 0.4 MG SL tablet Place 1  tablet (0.4 mg total) under the tongue every 5 (five) minutes as needed for chest pain. 06/05/15  Yes Janece Canterbury, MD  pantoprazole (PROTONIX) 40 MG tablet Take 1 tablet (40 mg total) by mouth daily. Patient taking differently: Take 40 mg by mouth daily as needed (for acid reflux).  06/05/15  Yes Janece Canterbury, MD  pregabalin (LYRICA) 50 MG capsule Take 1 capsule (50 mg total) by mouth daily. 12/15/14  Yes Geradine Girt, DO  sevelamer carbonate (RENVELA) 800 MG tablet Take 2,400 mg by mouth 3 (three) times daily with meals.    Yes Historical Provider, MD  simethicone (GAS-X) 80 MG chewable tablet Chew 1 tablet (80 mg total) by mouth every 6 (six) hours as needed for flatulence. 08/29/15  Yes Collier Salina, MD  venlafaxine XR (EFFEXOR XR) 75 MG 24 hr capsule Take 1  capsule (75 mg total) by mouth daily with breakfast. 07/27/15  Yes Shela Leff, MD  Darbepoetin Alfa (ARANESP) 25 MCG/0.42ML SOSY injection Inject 0.42 mLs (25 mcg total) into the vein every Tuesday with hemodialysis. 12/15/14   Geradine Girt, DO   Current Facility-Administered Medications  Medication Dose Route Frequency Provider Last Rate Last Dose  . acetaminophen (TYLENOL) tablet 500 mg  500 mg Oral Q6H PRN Norval Morton, MD      . amitriptyline (ELAVIL) tablet 100 mg  100 mg Oral QHS Rondell A Smith, MD      . amLODipine (NORVASC) tablet 10 mg  10 mg Oral Daily Norval Morton, MD   10 mg at 10/29/15 1056  . aspirin chewable tablet 81 mg  81 mg Oral Daily Rondell Charmayne Sheer, MD   81 mg at 10/29/15 1056  . atorvastatin (LIPITOR) tablet 20 mg  20 mg Oral QHS Rondell A Smith, MD      . carvedilol (COREG) tablet 25 mg  25 mg Oral BID WC Valentina Gu, NP      . cinacalcet (SENSIPAR) tablet 90 mg  90 mg Oral Q breakfast Norval Morton, MD   90 mg at 10/29/15 0853  . clopidogrel (PLAVIX) tablet 75 mg  75 mg Oral Daily Rondell A Tamala Julian, MD   75 mg at 10/29/15 1056  . heparin injection 5,000 Units  5,000 Units Subcutaneous  3 times per day Norval Morton, MD   5,000 Units at 10/29/15 0650  . hydrALAZINE (APRESOLINE) injection 10 mg  10 mg Intravenous Q4H PRN Norval Morton, MD   10 mg at 10/29/15 0116  . insulin aspart (novoLOG) injection 0-9 Units  0-9 Units Subcutaneous 6 times per day Norval Morton, MD   0 Units at 10/29/15 0425  . [START ON 10/30/2015] isosorbide mononitrate (IMDUR) 24 hr tablet 60 mg  60 mg Oral Daily Ripudeep K Rai, MD      . losartan (COZAAR) tablet 100 mg  100 mg Oral QHS Valentina Gu, NP      . meclizine (ANTIVERT) tablet 25 mg  25 mg Oral TID PRN Norval Morton, MD      . multivitamin (RENA-VIT) tablet 1 tablet  1 tablet Oral QHS Rondell A Tamala Julian, MD      . nitroGLYCERIN (NITROSTAT) SL tablet 0.4 mg  0.4 mg Sublingual Q5 min PRN Norval Morton, MD      . ondansetron (ZOFRAN) injection 4 mg  4 mg Intravenous Q6H PRN Ripudeep K Rai, MD      . pantoprazole (PROTONIX) EC tablet 40 mg  40 mg Oral Daily Norval Morton, MD   40 mg at 10/29/15 1055  . pregabalin (LYRICA) capsule 50 mg  50 mg Oral Daily Norval Morton, MD   50 mg at 10/29/15 1056  . senna-docusate (Senokot-S) tablet 1 tablet  1 tablet Oral QHS PRN Norval Morton, MD      . sevelamer carbonate (RENVELA) tablet 3,200 mg  3,200 mg Oral TID WC Valentina Gu, NP      . simethicone [facility]) chewable tablet 80 mg  80 mg Oral Q6H PRN Norval Morton, MD      . sucroferric oxyhydroxide (VELPHORO) chewable tablet 1,000 mg  1,000 mg Oral TID WC Valentina Gu, NP      . venlafaxine XR (EFFEXOR-XR) 24 hr capsule 75 mg  75 mg Oral Q breakfast Norval Morton, MD   75 mg  at 10/29/15 0856   Labs: Basic Metabolic Panel:  Recent Labs Lab 10/28/15 1522 10/28/15 1528 10/29/15 0640  NA 138 137 138  K 4.5 4.6 4.2  CL 97* 102 99*  CO2 22  --  24  GLUCOSE 102* 102* 91  BUN 22* 29* 27*  CREATININE 8.66* 8.20* 9.92*  CALCIUM 9.8  --  9.5  PHOS  --   --  7.7*   Liver Function Tests:  Recent Labs Lab 10/28/15 1522  10/29/15 0640  AST 11*  --   ALT 7*  --   ALKPHOS 86  --   BILITOT 0.6  --   PROT 7.5  --   ALBUMIN 3.4* 3.3*   No results for input(s): LIPASE, AMYLASE in the last 168 hours. No results for input(s): AMMONIA in the last 168 hours. CBC:  Recent Labs Lab 10/28/15 1522 10/28/15 1528 10/29/15 0640  WBC 8.0  --  8.1  NEUTROABS 5.9  --   --   HGB 12.0* 13.9 11.7*  HCT 38.5* 41.0 36.9*  MCV 86.3  --  84.6  PLT 196  --  230   Cardiac Enzymes: No results for input(s): CKTOTAL, CKMB, CKMBINDEX, TROPONINI in the last 168 hours. CBG:  Recent Labs Lab 10/29/15 0320 10/29/15 0650 10/29/15 1121  GLUCAP 87 94 88   Iron Studies: No results for input(s): IRON, TIBC, TRANSFERRIN, FERRITIN in the last 72 hours. Studies/Results: Dg Chest 2 View  10/28/2015  CLINICAL DATA:  Right arm and leg weakness. EXAM: CHEST  2 VIEW COMPARISON:  October 18, 2015. FINDINGS: Stable cardiomediastinal silhouette. Mild central pulmonary vascular congestion is noted with bilateral pulmonary edema. No pneumothorax or pleural effusion is noted. Bony thorax is unremarkable. IMPRESSION: Mild central pulmonary vascular congestion with bilateral pulmonary edema. Electronically Signed   By: Marijo Conception, M.D.   On: 10/28/2015 16:30   Ct Head Wo Contrast  10/28/2015  CLINICAL DATA:  Right-sided weakness today EXAM: CT HEAD WITHOUT CONTRAST TECHNIQUE: Contiguous axial images were obtained from the base of the skull through the vertex without intravenous contrast. COMPARISON:  04/01/2015 FINDINGS: No skull fracture. No significant inflammatory change in the sinuses. No hemorrhage or extra-axial fluid. No hydrocephalus. Mild atrophy. Moderate diffuse low attenuation in the deep white matter. No evidence of mass or vascular territory infarct. IMPRESSION: Stable chronic involutional change with no acute findings Electronically Signed   By: Skipper Cliche M.D.   On: 10/28/2015 15:38   Mr Brain Wo Contrast  10/28/2015   CLINICAL DATA:  60 year old diabetic hypertensive male on dialysis with hyperlipidemia presenting with right-sided weakness and headache. Subsequent encounter. EXAM: MRI HEAD WITHOUT CONTRAST TECHNIQUE: Multiplanar, multiecho pulse sequences of the brain and surrounding structures were obtained without intravenous contrast. COMPARISON:  10/28/2015 CT and 04/01/2015 MR FINDINGS: No acute infarct, intracranial hemorrhage or hydrocephalus. Remote infarct of the thalamus bilaterally. Remote posterior right frontal lobe smaller infarct. Prominent small vessel disease changes most notable periventricular region and within the pons. No intracranial mass lesion noted on this unenhanced exam. Major intracranial vascular structures are patent. Moderate exophthalmos. Symmetric normal appearance of the extra-ocular muscles. Question abnormality within the cord at the C3 level. Cervical spine MR can be obtained for further delineation. Cervical medullary junction, pituitary region and fine-needle region within normal limits. IMPRESSION: No acute infarct or hydrocephalus. Remote infarct of the thalamus bilaterally. Remote posterior right frontal lobe smaller infarct. Prominent small vessel disease changes most notable periventricular region and within the pons. No intracranial  mass lesion noted on this unenhanced exam. Moderate exophthalmos. Symmetric normal appearance of the extra-ocular muscles. Question abnormality within the cord at the C3 level. Cervical spine MR can be obtained for further delineation. Electronically Signed   By: Genia Del M.D.   On: 10/28/2015 20:12   Mr Cervical Spine Wo Contrast  10/29/2015  CLINICAL DATA:  Initial evaluation for right-sided weakness. EXAM: MRI CERVICAL SPINE WITHOUT CONTRAST TECHNIQUE: Multiplanar, multisequence MR imaging of the cervical spine was performed. No intravenous contrast was administered. COMPARISON:  None. FINDINGS: Visualized portions of the brain demonstrate no  acute abnormality. T2 hyperintensity within the pons like related to chronic small vessel ischemic changes. Foramen magnum widely patent. Vertebral bodies are normally aligned with preservation of the normal cervical lordosis. Vertebral body heights well preserved. No fracture or listhesis. Diffusely decreased T1 signal intensity within the visualized vertebral body bone marrow, which may related to underlying anemia. No focal osseous lesion. No marrow edema. Signal intensity within the cervical spinal cord is normal. Previously questioned abnormality at the level of C3 not seen on this exam, this was likely artifactual on previous study. Paraspinous soft tissues demonstrate no acute abnormality. C2-C3: Mild bilateral uncovertebral hypertrophy. No significant stenosis. C3-C4: Bilateral uncovertebral spurring with minimal disc bulge. Mild bilateral foraminal stenosis. No significant canal narrowing. C4-C5: Bilateral uncovertebral spurring. Broad-based right paracentral disc bulge. Resultant moderate right foraminal narrowing. No significant canal or left foraminal stenosis. Mild bilateral facet arthrosis. C5-C6: Bilateral uncovertebral spurring with mild disc bulge. Moderate right with more mild left foraminal narrowing. Posterior disc bulge minimally indents the ventral thecal sac without significant stenosis. C6-C7: Mild bilateral uncovertebral hypertrophy and facet arthrosis. Resultant moderate right foraminal narrowing. No significant left neural foraminal stenosis. Central canal widely patent. C7-T1: Mild bilateral uncovertebral hypertrophy. Minimal disc bulge. No significant stenosis. Visualized upper thoracic spine within normal limits. IMPRESSION: 1. No acute abnormality within the cervical spine. 2. Mild to moderate multilevel degenerative disc disease as above. There is resultant moderate right foraminal narrowing at C4-5, C5-6, and C6-7 with more mild right foraminal narrowing at C3-4. Mild left foraminal  narrowing at C3-4. No significant canal stenosis. 3. Diffusely decreased T1 signal intensity throughout the visualized bone marrow, nonspecific, but may be related to patient's underlying anemia. Electronically Signed   By: Jeannine Boga M.D.   On: 10/29/2015 02:52    ROS: As per HPI otherwise negative.   Physical Exam: Filed Vitals:   10/29/15 0356 10/29/15 0400 10/29/15 0406 10/29/15 0600  BP: 183/100   184/97  Pulse: 110   108  Temp:  97.9 F (36.6 C)  98.4 F (36.9 C)  TempSrc:  Oral  Oral  Resp: 20   18  Height:  6' (1.829 m)    Weight:   94.858 kg (209 lb 2 oz)   SpO2: 96%   95%     General: Well developed, well nourished, in no acute distress. Head: Normocephalic, atraumatic, sclera non-icteric, mucus membranes are moist Neck: Supple. JVD not elevated. Lungs: Bilateral breath sounds with fine bibasilar crackles.  Heart: RRR with S1 S2. II/VI Systolic M. Mild JVD. Abdomen: Soft, non-tender, non-distended with normoactive bowel sounds. No rebound/guarding. No obvious abdominal masses. M-S:  Strength and tone 4/5 UE 5/5 LE  Lower extremities:without edema or ischemic changes, no open wounds  Neuro: Alert and oriented X 3. Moves all extremities spontaneously. R grip 3/5, R arm drift ++, RLE SLR 2-3+/5. L side 5/5 throughout Psych:  Responds to questions appropriately with  a normal affect. Dialysis Access: LUA AVF + thrill + Bruit  Dialysis Orders: Center: [facility]  on TTS . TueThuSat, 4 hrs 0 min, 180NRe Optiflux, BFR 500, DFR Manual 800 mL/min, EDW 90.5 (kg), Dialysate 2.0 K, 2.0 Ca, UFR Profile: None, Sodium Model: None, Access:LUA AV Fistula Heparin: 8000 unit per treatment Mircera: 36mcg IV (to be started 11/01/15) Venofer: 50 mg IV q weekly (to be started 11/01/15)  Assessment/Plan: 1.  Acute right-sided weakness: Unclear etiology. Per primary/neuro. CT and MRI negative although has clear RUE and RLE weakness on exam.   2.  ESRD -  TTS at [facility]. Will have HD today.  K+4.2. Encouraged to stay entire treatment.  3.  Hypertension/volume  -Patient is hypertensive. Med list adjusted as PTA med list in EPIC does not correlate with home med list. Cont Losartan 100 mg PO Q hs, Amlodipine 10 mg Q HS (pt states he is still taking this-not on med list-ordered per primary-will leave), Coreg 25 mg PO BID.   Will attempt UFG 3.5-4 liters today, attempt to lower volume.  4.  Anemia  - HGB 11.7. No ESA at present. Follow Hgb 5.  Metabolic bone disease -  Ca 9.5 C Ca 10.06, hold hectoral for now. Continue binders. 2.0 K 2.0 Ca bath.  6.  Nutrition - Albumin 3.3. Renal/Carb mod diet. Fld restrictons, renal vits, nepro 7. DM: S/S insulin per primary 8. Pulm edema - by CXR, not real symptomatic, mild.  Max UF w HD today.   Rita H. Owens Shark, NP-C 10/29/2015, 1:16 PM  [facility] [phone]  Pt seen, examined, agree w assess/plan as above with additions as indicated.  Kelly Splinter MD [employer] pager [phone]    cell [phone] 10/29/2015, 5:01 PM

## 2015-10-29 NOTE — H&P (Signed)
[facility] History and Physical  [name] [name] [id number] DOB: 10/25/1955 DOA: 10/28/2015  Referring physician: ED PCP: [name]   Chief Complaint: Right arm and leg weakness  HPI:  [name] is a 60 year old right-hand dominant male with past medical history significant for ESRD on HD (T/TH/Sat), HTN, HLD, DM type 2, CAD s/p stenting, chronic CHF; who presents with complaints of right arm and leg weakness. Patient notes that he woke up around 3 AM as he normally does to have his morning coffee and felt somewhat weak on his right side. He laid back down and states that symptoms waxed and waned in intensity throughout the early morning. However at 1 PM 1 symptoms not improving he notified family to bring him to the hospital for further evaluation. He had hemodialysis yesterday but notes that it hurts to take a deep breath in. Denies any fever, chills, headache, neck pain, or new vision changes. He notes he fell a couple weeks ago, but did not hit his head after losing his balance when he stood too quickly.  Upon admission to the emergency department patient states that the right leg lower extremity weakness has improved some, but the right upper extremity weakness is unchanged. He was evaluated with a CT of the brain which showed no acute abnormalities. Neurology evaluated the patient and though this was possibly a functional weakness and ordered a MRI. MRI showed a possible abnormality in the cervical spine, but no acute infarct.    Review of Systems  Constitutional: Negative for chills and malaise/fatigue.  HENT: Negative for ear pain and tinnitus.   Eyes: Positive for blurred vision. Negative for pain.  Respiratory: Positive for shortness of breath. Negative for cough.   Cardiovascular: Positive for chest pain. Negative for leg swelling.  Gastrointestinal: Negative for nausea, vomiting and abdominal pain.  Genitourinary: Negative for urgency and frequency.   Musculoskeletal: Positive for joint pain and falls.  Skin: Negative for itching and rash.  Neurological: Positive for focal weakness. Negative for speech change, seizures and loss of consciousness.  Psychiatric/Behavioral: Negative for hallucinations and substance abuse.     Past Medical History  Diagnosis Date  . Hypertension   . Hematochezia     a. 2014: colonscopy, which showed moderately-sized internal hemorrhoids, two 74mm polyps in transverse colon and ascending colon that were resected, five 2-69mm polyps in sigmoid colon, descending colon, transverse colon, and ascending colon that were resected. An upper endoscopy was performed and showed normal esophagus, stomach, and duodenum.  . Hematuria     a. H/o hematuria 2014 with cystoscopy that was unrevealing for his source of hematuria. He underwent a kidney ultrasound on 10/14 that showed mildly echogenic and scarred kidneys compatible with medical renal disease, without hydronephrosis or renal calculi.  [name] Anemia   . CAD (coronary artery disease)     a. per [facility] s/p 3.55mm x 19mm Vision BMS to mid LAD 12/2009 and Xience DES to mid LAD 10/2010.  . Colon polyps   . Chronic diastolic CHF (congestive heart failure) ([place])   . Hyperlipidemia   . Anginal pain ([place])   . Heart murmur   . 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 Type II diabetes mellitus ([place])   . History of blood transfusion     "had colonoscopy done; they had to give me some blood"  . Daily headache   . ESRD on dialysis [facility]) since ~ 2008    "  [name]; TTS" (07/21/2015)  . On home oxygen therapy     "2L prn" (07/21/2015)  . Renal insufficiency      Past Surgical History  Procedure Laterality Date  . Left heart catheterization with coronary angiogram N/A 11/23/2014    Procedure: LEFT HEART CATHETERIZATION WITH CORONARY ANGIOGRAM;  Surgeon: Troy Sine, MD;  Location: [facility] CATH LAB;  Service: Cardiovascular;  Laterality: N/A;  .  Lithotripsy  X1  . Cystoscopy w/ stone manipulation  X2?  . Cardiac catheterization  "several"  . Coronary angioplasty with stent placement  "several"  . Eye surgery Bilateral     "laser OR for hemorrhage"  . Av fistula placement Left ~ 2007    "upper arm"      Social History:  reports that he quit smoking about 4 years ago. His smoking use included Cigarettes. He has a 4 pack-year smoking history. He has never used smokeless tobacco. He reports that he does not drink alcohol or use illicit drugs. Where does patient live--home  Can patient participate in ADLs?Yes  Allergies  Allergen Reactions  . Enalapril Hives    Family History  Problem Relation Age of Onset  . Hypertension    . Bone cancer Mother   . Anuerysm Father   . Diabetes type II Daughter        Prior to Admission medications   Medication Sig Start Date End Date Taking? Authorizing Provider  acetaminophen (TYLENOL) 500 MG tablet Take 500 mg by mouth every 6 (six) hours as needed for headache.    Yes Historical Provider, MD  amitriptyline (ELAVIL) 100 MG tablet Take 1 tablet (100 mg total) by mouth at bedtime. 07/27/15  Yes Shela Leff, MD  amLODipine (NORVASC) 10 MG tablet Take 10 mg by mouth daily.   Yes Historical Provider, MD  aspirin 81 MG chewable tablet Chew 1 tablet (81 mg total) by mouth daily. 07/22/15  Yes Iline Oven, MD  atorvastatin (LIPITOR) 20 MG tablet Take 20 mg by mouth at bedtime.    Yes Historical Provider, MD  carvedilol (COREG) 25 MG tablet Take 50 mg by mouth 2 (two) times daily.    Yes Historical Provider, MD  cinacalcet (SENSIPAR) 90 MG tablet Take 90 mg by mouth at bedtime.    Yes Historical Provider, MD  clopidogrel (PLAVIX) 75 MG tablet Take 1 tablet (75 mg total) by mouth daily. 11/24/14  Yes Juluis Mire, MD  doxercalciferol (HECTOROL) 4 MCG/2ML injection Inject 1 mL (2 mcg total) into the vein Every Tuesday,Thursday,and Saturday with dialysis. 12/15/14  Yes Geradine Girt,  DO  ferric gluconate 62.5 mg in sodium chloride 0.9 % 100 mL Inject 62.5 mg into the vein every Thursday with hemodialysis. 12/16/14  Yes Geradine Girt, DO  isosorbide mononitrate (IMDUR) 30 MG 24 hr tablet Take 1 tablet (30 mg total) by mouth daily. 07/27/15  Yes Shela Leff, MD  meclizine (ANTIVERT) 25 MG tablet Take 25 mg by mouth 3 (three) times daily as needed for dizziness.   Yes Historical Provider, MD  multivitamin (RENA-VIT) TABS tablet Take 1 tablet by mouth at bedtime. 12/15/14  Yes Geradine Girt, DO  nitroGLYCERIN (NITROSTAT) 0.4 MG SL tablet Place 1 tablet (0.4 mg total) under the tongue every 5 (five) minutes as needed for chest pain. 06/05/15  Yes Janece Canterbury, MD  pantoprazole (PROTONIX) 40 MG tablet Take 1 tablet (40 mg total) by mouth daily. Patient taking differently: Take 40 mg by mouth daily as needed (for  acid reflux).  06/05/15  Yes Janece Canterbury, MD  pregabalin (LYRICA) 50 MG capsule Take 1 capsule (50 mg total) by mouth daily. 12/15/14  Yes Geradine Girt, DO  sevelamer carbonate (RENVELA) 800 MG tablet Take 2,400 mg by mouth 3 (three) times daily with meals.    Yes Historical Provider, MD  simethicone (GAS-X) 80 MG chewable tablet Chew 1 tablet (80 mg total) by mouth every 6 (six) hours as needed for flatulence. 08/29/15  Yes Collier Salina, MD  venlafaxine XR (EFFEXOR XR) 75 MG 24 hr capsule Take 1 capsule (75 mg total) by mouth daily with breakfast. 07/27/15  Yes Shela Leff, MD  Darbepoetin Alfa (ARANESP) 25 MCG/0.42ML SOSY injection Inject 0.42 mLs (25 mcg total) into the vein every Tuesday with hemodialysis. 12/15/14   Geradine Girt, DO     Physical Exam: Filed Vitals:   10/28/15 1845 10/28/15 1900 10/28/15 2015 10/28/15 2016  BP: 176/105 185/113 180/105 180/105  Pulse: 99 98 100 100  Temp:      TempSrc:      Resp: 22 20  18   SpO2: 95% 96% 94% 95%     Constitutional: Vital signs reviewed. Patient is a well-developed and well-nourished in no  acute distress and cooperative with exam. Alert and oriented x3.  Head: Normocephalic and atraumatic  Ear: TM normal bilaterally  Mouth: no erythema or exudates, MMM  Eyes: PERRL, EOMI, conjunctivae normal, No scleral icterus.  Neck: Supple, Trachea midline normal ROM, No JVD, mass, thyromegaly, or carotid bruit present.  Cardiovascular: RRR, S1 normal, S2 normal, no MRG, pulses symmetric and intact bilaterally  Pulmonary/Chest: Positive crackles throughout both lung fields, no wheezes or rhonchi appreciated Abdominal: Soft. Non-tender, non-distended, bowel sounds are normal, no masses, organomegaly, or guarding present.  GU: no CVA tenderness Musculoskeletal: No joint deformities, erythema, or stiffness, ROM full and no nontender Ext: no edema and no cyanosis, pulses palpable bilaterally (DP and PT)  Hematology: no cervical, inginal, or axillary adenopathy.  Neurological: A&O x3, Strenght is 5 out of 5 on the left upper & lower extremity, Strength is 4 out of 5 on the right upper and lower extremity.  Skin: Warm, dry and intact. No rash, cyanosis, or clubbing.  Psychiatric: Normal mood and affect. speech and behavior is normal. Judgment and thought content normal. Cognition and memory are normal.      Data Review   Micro Results No results found for this or any previous visit (from the past 240 hour(s)).  Radiology Reports Dg Chest 2 View  10/28/2015  CLINICAL DATA:  Right arm and leg weakness. EXAM: CHEST  2 VIEW COMPARISON:  October 18, 2015. FINDINGS: Stable cardiomediastinal silhouette. Mild central pulmonary vascular congestion is noted with bilateral pulmonary edema. No pneumothorax or pleural effusion is noted. Bony thorax is unremarkable. IMPRESSION: Mild central pulmonary vascular congestion with bilateral pulmonary edema. Electronically Signed   By: Marijo Conception, M.D.   On: 10/28/2015 16:30   Dg Chest 2 View  10/18/2015  CLINICAL DATA:  Chest pain and shortness of breath  for 2 days EXAM: CHEST - 2 VIEW COMPARISON:  09/18/2015 FINDINGS: Cardiac shadow is mildly enlarged but stable. The lungs are well aerated bilaterally. Mild central congestion is noted. No interstitial edema is noted. No effusion or infiltrate is seen. IMPRESSION: Mild central vascular congestion. No other focal abnormality is seen. Electronically Signed   By: Inez Catalina M.D.   On: 10/18/2015 13:00   Ct Head Wo Contrast  10/28/2015  CLINICAL DATA:  Right-sided weakness today EXAM: CT HEAD WITHOUT CONTRAST TECHNIQUE: Contiguous axial images were obtained from the base of the skull through the vertex without intravenous contrast. COMPARISON:  04/01/2015 FINDINGS: No skull fracture. No significant inflammatory change in the sinuses. No hemorrhage or extra-axial fluid. No hydrocephalus. Mild atrophy. Moderate diffuse low attenuation in the deep white matter. No evidence of mass or vascular territory infarct. IMPRESSION: Stable chronic involutional change with no acute findings Electronically Signed   By: Skipper Cliche M.D.   On: 10/28/2015 15:38   Mr Brain Wo Contrast  10/28/2015  CLINICAL DATA:  60 year old diabetic hypertensive male on dialysis with hyperlipidemia presenting with right-sided weakness and headache. Subsequent encounter. EXAM: MRI HEAD WITHOUT CONTRAST TECHNIQUE: Multiplanar, multiecho pulse sequences of the brain and surrounding structures were obtained without intravenous contrast. COMPARISON:  10/28/2015 CT and 04/01/2015 MR FINDINGS: No acute infarct, intracranial hemorrhage or hydrocephalus. Remote infarct of the thalamus bilaterally. Remote posterior right frontal lobe smaller infarct. Prominent small vessel disease changes most notable periventricular region and within the pons. No intracranial mass lesion noted on this unenhanced exam. Major intracranial vascular structures are patent. Moderate exophthalmos. Symmetric normal appearance of the extra-ocular muscles. Question abnormality  within the cord at the C3 level. Cervical spine MR can be obtained for further delineation. Cervical medullary junction, pituitary region and fine-needle region within normal limits. IMPRESSION: No acute infarct or hydrocephalus. Remote infarct of the thalamus bilaterally. Remote posterior right frontal lobe smaller infarct. Prominent small vessel disease changes most notable periventricular region and within the pons. No intracranial mass lesion noted on this unenhanced exam. Moderate exophthalmos. Symmetric normal appearance of the extra-ocular muscles. Question abnormality within the cord at the C3 level. Cervical spine MR can be obtained for further delineation. Electronically Signed   By: Genia Del M.D.   On: 10/28/2015 20:12     CBC  Recent Labs Lab 10/28/15 1522 10/28/15 1528  WBC 8.0  --   HGB 12.0* 13.9  HCT 38.5* 41.0  PLT 196  --   MCV 86.3  --   MCH 26.9  --   MCHC 31.2  --   RDW 16.6*  --   LYMPHSABS 1.0  --   MONOABS 0.8  --   EOSABS 0.2  --   BASOSABS 0.0  --     Chemistries   Recent Labs Lab 10/28/15 1522 10/28/15 1528  NA 138 137  K 4.5 4.6  CL 97* 102  CO2 22  --   GLUCOSE 102* 102*  BUN 22* 29*  CREATININE 8.66* 8.20*  CALCIUM 9.8  --   AST 11*  --   ALT 7*  --   ALKPHOS 86  --   BILITOT 0.6  --    ------------------------------------------------------------------------------------------------------------------ estimated creatinine clearance is 11.9 mL/min (by C-G formula based on Cr of 8.2). ------------------------------------------------------------------------------------------------------------------ No results for input(s): HGBA1C in the last 72 hours. ------------------------------------------------------------------------------------------------------------------ No results for input(s): CHOL, HDL, LDLCALC, TRIG, CHOLHDL, LDLDIRECT in the last 72  hours. ------------------------------------------------------------------------------------------------------------------ No results for input(s): TSH, T4TOTAL, T3FREE, THYROIDAB in the last 72 hours.  Invalid input(s): FREET3 ------------------------------------------------------------------------------------------------------------------ No results for input(s): VITAMINB12, FOLATE, FERRITIN, TIBC, IRON, RETICCTPCT in the last 72 hours.  Coagulation profile  Recent Labs Lab 10/28/15 1522  INR 1.13    No results for input(s): DDIMER in the last 72 hours.  Cardiac Enzymes No results for input(s): CKMB, TROPONINI, MYOGLOBIN in the last 168 hours.  Invalid input(s): CK ------------------------------------------------------------------------------------------------------------------ Invalid input(s): POCBNP  CBG: No results for input(s): GLUCAP in the last 168 hours.     EKG: Independently reviewed. Sinus tachycardia with signs of biatrial enlargement and LVH  Assessment/Plan Principal Problem: Acute right-sided weakness: Symptoms started at approximately 3 AM this morning and have with the patient states waxed and waned in intensity throughout the day. CT and MRI of the brain showed no acute signs of a stroke. MRI did so some abnormality of the cervical spine for which he recommended a MRI of the cervical spine to further evaluate - Admit to telemetry bed - Neuro checks  - PT/OT/ SP eval and treat - MRI of the cervical spine to further evaluate - Follow-up neuro recommendations   ESRD on hemodialysis: Patient dialyzes Tuesday, Thursday, and Saturdays. Chest x-ray showing mild central pulmonary vascular congestion with bilateral pulmonary edema. Patient  appears to be volume overload - Nephro consult as patient needs hemodialysis in a.m. showing some signs of fluid overload - Continue Sensipar, Renvela  Diabetes mellitus type 2: Last hemoglobin A1c was 6 in August  2016. - CBGs q4 hr with sensitive SSI  Hypertension:  Uncontrolled.  Systolic blood pressures over 200 at times over night. -Hydralazine 10mg  IV prn sBp  >180 or dBp >100 - Continue Coreg, norvasc  Coronary artery disease s/p percutaneous intervention - Continue aspirin, Plavix  Systolic congestive heart failure EF 45-50% on echo in 05/2015 with grade 2 diastolic dysfunction - Continue isosorbide mononitrate  GERD - Protonix  Code Status:   full Family Communication: bedside Disposition Plan: admit   Total time spent 55 minutes.Greater than 50% of this time was spent in counseling, explanation of diagnosis, planning of further management, and coordination of care  [facility] Pager [phone]  If 7PM-7AM, please contact night-coverage [email or web address] Password [email or web address] 10/28/2015, 9:07 PM

## 2015-10-29 NOTE — Progress Notes (Signed)
PT Cancellation Note  Pt unavailable for PT eval. He is out of his room for HD. PT to re-attempted tomorrow.  Lorrin Goodell, PT  Office # [phone] Pager [phone]

## 2015-10-29 NOTE — ED Notes (Signed)
CBG 87. RN notified.

## 2015-10-30 ENCOUNTER — Inpatient Hospital Stay (HOSPITAL_COMMUNITY): Payer: Medicare Other

## 2015-10-30 LAB — GLUCOSE, CAPILLARY
GLUCOSE-CAPILLARY: 161 mg/dL — AB (ref 65–99)
Glucose-Capillary: 78 mg/dL (ref 65–99)
Glucose-Capillary: 119 mg/dL — ABNORMAL HIGH (ref 65–99)
Glucose-Capillary: 174 mg/dL — ABNORMAL HIGH (ref 65–99)

## 2015-10-30 LAB — CBC
HCT: 38 % — ABNORMAL LOW (ref 39.0–52.0)
HEMOGLOBIN: 11.9 g/dL — AB (ref 13.0–17.0)
MCH: 26.7 pg (ref 26.0–34.0)
MCHC: 31.3 g/dL (ref 30.0–36.0)
MCV: 85.2 fL (ref 78.0–100.0)
Platelets: 217 10*3/uL (ref 150–400)
RBC: 4.46 MIL/uL (ref 4.22–5.81)
RDW: 16.8 % — ABNORMAL HIGH (ref 11.5–15.5)
WBC: 6.3 10*3/uL (ref 4.0–10.5)

## 2015-10-30 LAB — BASIC METABOLIC PANEL
ANION GAP: 13 (ref 5–15)
BUN: 16 mg/dL (ref 6–20)
CHLORIDE: 96 mmol/L — AB (ref 101–111)
CO2: 27 mmol/L (ref 22–32)
Calcium: 9.5 mg/dL (ref 8.9–10.3)
Creatinine, Ser: 6.59 mg/dL — ABNORMAL HIGH (ref 0.61–1.24)
GFR calc non Af Amer: 8 mL/min — ABNORMAL LOW (ref >60)
GFR, EST AFRICAN AMERICAN: 10 mL/min — AB (ref >60)
Glucose, Bld: 85 mg/dL (ref 65–99)
Potassium: 4.2 mmol/L (ref 3.5–5.1)
Sodium: 136 mmol/L (ref 135–145)

## 2015-10-30 MED ORDER — ISOSORBIDE MONONITRATE ER 30 MG PO TB24: 30.0000 mg | ORAL_TABLET | Freq: Every day | ORAL | Status: DC

## 2015-10-30 MED ORDER — LOSARTAN POTASSIUM 100 MG PO TABS: 100.0000 mg | ORAL_TABLET | Freq: Every day | ORAL | Status: DC

## 2015-10-30 MED ORDER — AMLODIPINE BESYLATE 10 MG PO TABS: 10.0000 mg | ORAL_TABLET | Freq: Every day | ORAL | Status: DC

## 2015-10-30 MED ORDER — CARVEDILOL 12.5 MG PO TABS: 50.0000 mg | ORAL_TABLET | Freq: Two times a day (BID) | ORAL | Status: DC

## 2015-10-30 MED ORDER — PROCHLORPERAZINE EDISYLATE 5 MG/ML IJ SOLN
10.0000 mg | Freq: Once | INTRAMUSCULAR | Status: AC
  Administered 2015-10-30: 10 mg via INTRAVENOUS
  Filled 2015-10-30: qty 2

## 2015-10-30 MED ORDER — PROMETHAZINE HCL 25 MG/ML IJ SOLN: 25.0000 mg | Freq: Four times a day (QID) | INTRAMUSCULAR | Status: DC | PRN

## 2015-10-30 MED ORDER — CARVEDILOL 25 MG PO TABS: 50.0000 mg | ORAL_TABLET | Freq: Two times a day (BID) | ORAL | Status: DC

## 2015-10-30 MED ORDER — CARVEDILOL 12.5 MG PO TABS
50.0000 mg | ORAL_TABLET | Freq: Two times a day (BID) | ORAL | Status: DC
  Administered 2015-10-30 – 2015-10-31 (×2): 50 mg via ORAL
  Filled 2015-10-30 (×2): qty 4

## 2015-10-30 MED ORDER — PROMETHAZINE HCL 25 MG/ML IJ SOLN
25.0000 mg | Freq: Once | INTRAMUSCULAR | Status: AC
  Administered 2015-10-30: 25 mg via INTRAVENOUS
  Filled 2015-10-30: qty 1

## 2015-10-30 MED ORDER — UNABLE TO FIND: Status: DC

## 2015-10-30 MED ORDER — ONDANSETRON HCL 4 MG/2ML IJ SOLN
4.0000 mg | Freq: Once | INTRAMUSCULAR | Status: AC
Start: 2015-10-30 — End: 2015-10-30
  Administered 2015-10-30: 4 mg via INTRAVENOUS
  Filled 2015-10-30: qty 2

## 2015-10-30 MED ORDER — ISOSORBIDE MONONITRATE ER 60 MG PO TB24: 60.0000 mg | ORAL_TABLET | Freq: Every day | ORAL | Status: DC

## 2015-10-30 MED ORDER — ONDANSETRON HCL 4 MG/2ML IJ SOLN
4.0000 mg | INTRAMUSCULAR | Status: DC | PRN
  Filled 2015-10-30: qty 2

## 2015-10-30 MED ORDER — ISOSORBIDE MONONITRATE ER 30 MG PO TB24
30.0000 mg | ORAL_TABLET | Freq: Every day | ORAL | Status: DC
  Administered 2015-10-30 – 2015-10-31 (×2): 30 mg via ORAL
  Filled 2015-10-30 (×2): qty 1

## 2015-10-30 NOTE — Evaluation (Signed)
Occupational Therapy Evaluation Patient Details Name: Davies Brakefield MRN: [id number] DOB: 10/21/1955 Today's Date: 10/30/2015    History of Present Illness 60 year old right-hand dominant male with past medical history significant for ESRD on HD (T/TH/Sat), HTN, HLD, DM type 2, CAD s/p stenting, chronic CHF; who presents with complaints of right arm and leg weakness. MRI showed no acute infarct but did show cervical spine DDD.   Clinical Impression    Pt. Was cooperative with therapy and was motivated to participate. Pt. Was able to perform sit to stand, stand to sit, and AMB at S level with walker. Pt. States he is a little weak. Pt. Is to d/c today and was advised to have family A with mobility and showers initally. Pt. Was able to perform ADLs sitting EOB and standing next to bed at Mod I level. Pt. Does not need OT followup at this time. Pt. States he is close to baseline and does not need in home OT.   Follow Up Recommendations  No OT follow up    Equipment Recommendations       Recommendations for Other Services       Precautions / Restrictions Precautions Precautions: Fall Restrictions Weight Bearing Restrictions: No      Mobility Bed Mobility Overal bed mobility: Modified Independent             General bed mobility comments: use of bed rails  Transfers Overall transfer level: Needs assistance Equipment used: Rolling walker (2 wheeled) Transfers: Stand Pivot Transfers Sit to Stand: Supervision Stand pivot transfers: Supervision       General transfer comment:  (Pt. states he feels weaker.)    Balance Overall balance assessment: Needs assistance Sitting-balance support: No upper extremity supported;Feet supported Sitting balance-Leahy Scale: Good     Standing balance support: During functional activity;No upper extremity supported Standing balance-Leahy Scale: Fair                              ADL Overall ADL's : At baseline                                       General ADL Comments:  (Pt. states he feels weaker but Mod I with ADLs.)     Vision Additional Comments: Pt. states he is low vision in L eye secondary to cataracts.   Perception     Praxis      Pertinent Vitals/Pain Pain Assessment: No/denies pain     Hand Dominance Right   Extremity/Trunk Assessment Upper Extremity Assessment Upper Extremity Assessment: Generalized weakness   Lower Extremity Assessment Lower Extremity Assessment: RLE deficits/detail RLE Deficits / Details: 4-/5 grossly graded hip and knee   Cervical / Trunk Assessment Cervical / Trunk Assessment: Kyphotic   Communication Communication Communication: No difficulties   Cognition Arousal/Alertness: Awake/alert Behavior During Therapy: WFL for tasks assessed/performed Overall Cognitive Status: Within Functional Limits for tasks assessed                     General Comments       Exercises       Shoulder Instructions      Home Living Family/patient expects to be discharged to:: Private residence Living Arrangements: Children Available Help at Discharge: Available 24 hours/day;Family Type of Home: Apartment Home Access: Level entry     Home Layout: Two level;Able to  live on main level with bedroom/bathroom     Bathroom Shower/Tub: Walk-in shower   Bathroom Toilet: Handicapped height     Home Equipment: Environmental consultant - 2 wheels;Kasandra Knudsen - single point      Lives With: Son    Prior Functioning/Environment Level of Independence: Independent with assistive device(s)             OT Diagnosis: Generalized weakness   OT Problem List: Decreased activity tolerance   OT Treatment/Interventions:      OT Goals(Current goals can be found in the care plan section) Acute Rehab OT Goals Patient Stated Goal:  (home)  OT Frequency:     Barriers to D/C:            Co-evaluation              End of Session Equipment Utilized During  Treatment: Rolling walker  Activity Tolerance:   Patient left: in bed;with call bell/phone within reach;with bed alarm set   Time: 1230-1300 OT Time Calculation (min): 30 min Charges:  OT Evaluation $OT Eval Low Complexity: 1 Procedure OT Treatments $Self Care/Home Management : 8-22 mins G-Codes:    Ibraham Levi 11-18-15, 1:02 PM

## 2015-10-30 NOTE — Discharge Summary (Signed)
Physician Discharge Summary   Patient ID: Latwan Danowski MRN: [id number] DOB/AGE: 03-22-1956 60 y.o.  Admit date: 10/28/2015 Discharge date: 10/30/2015  Primary Care Physician:  [name]  Discharge Diagnoses:    . Acute right-sided weakness, unclear etiology, functional component . accelerated hypertension  . ESRD on hemodialysis TTS  . Diastolic dysfunction-grade 2 . Volume overload  Consults: Nephrology Neurology  Recommendations for Outpatient Follow-up:  1. Patient was given prescription for outpatient PT 2. Given prescription for antihypertensive   DIET: Renal diet    Allergies:   Allergies  Allergen Reactions  . Enalapril Hives     DISCHARGE MEDICATIONS: Current Discharge Medication List    START taking these medications   Details  losartan (COZAAR) 100 MG tablet Take 1 tablet (100 mg total) by mouth at bedtime. Qty: 30 tablet, Refills: 3    UNABLE TO FIND Outpatient physical therapy  Diagnosis: generalized debility, ESRD on hemodialysis Qty: 1 Mutually Defined, Refills: 0      CONTINUE these medications which have CHANGED   Details  amLODipine (NORVASC) 10 MG tablet Take 1 tablet (10 mg total) by mouth daily. Qty: 30 tablet, Refills: 3    carvedilol (COREG) 25 MG tablet Take 2 tablets (50 mg total) by mouth 2 (two) times daily. Qty: 60 tablet, Refills: 3    isosorbide mononitrate (IMDUR) 30 MG 24 hr tablet Take 1 tablet (30 mg total) by mouth daily. Qty: 30 tablet, Refills: 3      CONTINUE these medications which have NOT CHANGED   Details  acetaminophen (TYLENOL) 500 MG tablet Take 500 mg by mouth every 6 (six) hours as needed for headache.     amitriptyline (ELAVIL) 100 MG tablet Take 1 tablet (100 mg total) by mouth at bedtime. Qty: 30 tablet, Refills: 2    aspirin 81 MG chewable tablet Chew 1 tablet (81 mg total) by mouth daily.    atorvastatin (LIPITOR) 20 MG tablet Take 20 mg by mouth at bedtime.     cinacalcet (SENSIPAR)  90 MG tablet Take 90 mg by mouth at bedtime.     clopidogrel (PLAVIX) 75 MG tablet Take 1 tablet (75 mg total) by mouth daily. Qty: 30 tablet, Refills: 11    doxercalciferol (HECTOROL) 4 MCG/2ML injection Inject 1 mL (2 mcg total) into the vein Every Tuesday,Thursday,and Saturday with dialysis. Qty: 2 mL    ferric gluconate 62.5 mg in sodium chloride 0.9 % 100 mL Inject 62.5 mg into the vein every Thursday with hemodialysis.    meclizine (ANTIVERT) 25 MG tablet Take 25 mg by mouth 3 (three) times daily as needed for dizziness.    multivitamin (RENA-VIT) TABS tablet Take 1 tablet by mouth at bedtime. Qty: 30 tablet, Refills: 0    nitroGLYCERIN (NITROSTAT) 0.4 MG SL tablet Place 1 tablet (0.4 mg total) under the tongue every 5 (five) minutes as needed for chest pain. Qty: 30 tablet, Refills: 0    pantoprazole (PROTONIX) 40 MG tablet Take 1 tablet (40 mg total) by mouth daily. Qty: 30 tablet, Refills: 0    pregabalin (LYRICA) 50 MG capsule Take 1 capsule (50 mg total) by mouth daily. Qty: 30 capsule, Refills: 0    sevelamer carbonate (RENVELA) 800 MG tablet Take 2,400 mg by mouth 3 (three) times daily with meals.     simethicone (GAS-X) 80 MG chewable tablet Chew 1 tablet (80 mg total) by mouth every 6 (six) hours as needed for flatulence. Qty: 60 tablet, Refills: 0  venlafaxine XR (EFFEXOR XR) 75 MG 24 hr capsule Take 1 capsule (75 mg total) by mouth daily with breakfast. Qty: 30 capsule, Refills: 2    Darbepoetin Alfa (ARANESP) 25 MCG/0.42ML SOSY injection Inject 0.42 mLs (25 mcg total) into the vein every Tuesday with hemodialysis. Qty: 14.28 mL         Brief H and P: For complete details please refer to admission H and P, but in brief Patient is a 60 year old maleESRD on HD (T/TH/Sat), HTN, HLD, DM type 2, CAD s/p stenting, chronic CHF; who presented with complaints of right arm and leg weakness. Patient reported that he woke up around 3 AM and felt weaker on his right  side. In ED, CT of the brain showed no acute abnormalities. Neurology was consulted and per recommendation likely functional weakness. MRI brain showed no acute infarct. Of note, patient's BP has been also elevated since admission, 181/105 in ED.  Hospital Course:  Acute right-sided weakness likely due to malignant hypertension:  - Unclear etiology, patient was brought in as code stroke, seen by neurology, Dr. Aram Beecham who felt strongly that patient had functional component on exam. MRI of the brain showed no acute infarct. Neurology did not recommend full stroke Workup as MRI was negative and patient had inconsistent exam. - PTOT evaluation recommended outpatient PT - Patient could have right-sided weakness due to malignant hypertension. - MRI C-spine showed mild to moderate cervical spine disc degenerative disease - Continue aspirin and Plavix, LDL 65  ESRD on hemodialysis: TTS - Nephrology consulted, discussed with Dr. Melvia Heaps, patient underwent hemodialysis on 10/29/15. Dialysis on Tuesday. - Continue Sensipar, Renvela  Diabetes mellitus type 2: Last hemoglobin A1c was 6 in August 2016. - Diet controlled   malignant hypertension: Uncontrolled. Systolic blood pressures over 200 at times over night. -Patient was placed on hydralazine IV as needed and labetalol 1. - Restarted on amlodipine, Coreg, , added losartan, Imdur - Dialysis will help with BP  Coronary artery disease s/p percutaneous intervention - Continue aspirin, Plavix  Systolic congestive heart failure EF 45-50% on echo in 05/2015 with grade 2 diastolic dysfunction - Continue isosorbide mononitrate - Volume control with HD  GERD - Protonix   Day of Discharge BP 143/91 mmHg  Pulse 89  Temp(Src) 98.6 F (37 C) (Oral)  Resp 18  Ht 6' (1.829 m)  Wt 90.6 kg (199 lb 11.8 oz)  BMI 27.08 kg/m2  SpO2 94%  Physical Exam: General: Alert and awake oriented x3 not in any acute distress. HEENT: anicteric sclera, pupils  reactive to light and accommodation CVS: S1-S2 clear no murmur rubs or gallops Chest: clear to auscultation bilaterally, no wheezing rales or rhonchi Abdomen: soft nontender, nondistended, normal bowel sounds Extremities: no cyanosis, clubbing or edema noted bilaterally    The results of significant diagnostics from this hospitalization (including imaging, microbiology, ancillary and laboratory) are listed below for reference.    LAB RESULTS: Basic Metabolic Panel:  Recent Labs Lab 10/29/15 0640 10/30/15 0651  NA 138 136  K 4.2 4.2  CL 99* 96*  CO2 24 27  GLUCOSE 91 85  BUN 27* 16  CREATININE 9.92* 6.59*  CALCIUM 9.5 9.5  PHOS 7.7*  --    Liver Function Tests:  Recent Labs Lab 10/28/15 1522 10/29/15 0640  AST 11*  --   ALT 7*  --   ALKPHOS 86  --   BILITOT 0.6  --   PROT 7.5  --   ALBUMIN 3.4* 3.3*   No  results for input(s): LIPASE, AMYLASE in the last 168 hours. No results for input(s): AMMONIA in the last 168 hours. CBC:  Recent Labs Lab 10/28/15 1522  10/29/15 0640 10/30/15 0651  WBC 8.0  --  8.1 6.3  NEUTROABS 5.9  --   --   --   HGB 12.0*  < > 11.7* 11.9*  HCT 38.5*  < > 36.9* 38.0*  MCV 86.3  --  84.6 85.2  PLT 196  --  230 217  < > = values in this interval not displayed. Cardiac Enzymes: No results for input(s): CKTOTAL, CKMB, CKMBINDEX, TROPONINI in the last 168 hours. BNP: Invalid input(s): POCBNP CBG:  Recent Labs Lab 10/30/15 0639 10/30/15 1119  GLUCAP 78 161*    Significant Diagnostic Studies:  Dg Chest 2 View  10/28/2015  CLINICAL DATA:  Right arm and leg weakness. EXAM: CHEST  2 VIEW COMPARISON:  October 18, 2015. FINDINGS: Stable cardiomediastinal silhouette. Mild central pulmonary vascular congestion is noted with bilateral pulmonary edema. No pneumothorax or pleural effusion is noted. Bony thorax is unremarkable. IMPRESSION: Mild central pulmonary vascular congestion with bilateral pulmonary edema. Electronically Signed   By:  Marijo Conception, M.D.   On: 10/28/2015 16:30   Ct Head Wo Contrast  10/28/2015  CLINICAL DATA:  Right-sided weakness today EXAM: CT HEAD WITHOUT CONTRAST TECHNIQUE: Contiguous axial images were obtained from the base of the skull through the vertex without intravenous contrast. COMPARISON:  04/01/2015 FINDINGS: No skull fracture. No significant inflammatory change in the sinuses. No hemorrhage or extra-axial fluid. No hydrocephalus. Mild atrophy. Moderate diffuse low attenuation in the deep white matter. No evidence of mass or vascular territory infarct. IMPRESSION: Stable chronic involutional change with no acute findings Electronically Signed   By: Skipper Cliche M.D.   On: 10/28/2015 15:38   Mr Brain Wo Contrast  10/28/2015  CLINICAL DATA:  60 year old diabetic hypertensive male on dialysis with hyperlipidemia presenting with right-sided weakness and headache. Subsequent encounter. EXAM: MRI HEAD WITHOUT CONTRAST TECHNIQUE: Multiplanar, multiecho pulse sequences of the brain and surrounding structures were obtained without intravenous contrast. COMPARISON:  10/28/2015 CT and 04/01/2015 MR FINDINGS: No acute infarct, intracranial hemorrhage or hydrocephalus. Remote infarct of the thalamus bilaterally. Remote posterior right frontal lobe smaller infarct. Prominent small vessel disease changes most notable periventricular region and within the pons. No intracranial mass lesion noted on this unenhanced exam. Major intracranial vascular structures are patent. Moderate exophthalmos. Symmetric normal appearance of the extra-ocular muscles. Question abnormality within the cord at the C3 level. Cervical spine MR can be obtained for further delineation. Cervical medullary junction, pituitary region and fine-needle region within normal limits. IMPRESSION: No acute infarct or hydrocephalus. Remote infarct of the thalamus bilaterally. Remote posterior right frontal lobe smaller infarct. Prominent small vessel disease  changes most notable periventricular region and within the pons. No intracranial mass lesion noted on this unenhanced exam. Moderate exophthalmos. Symmetric normal appearance of the extra-ocular muscles. Question abnormality within the cord at the C3 level. Cervical spine MR can be obtained for further delineation. Electronically Signed   By: Genia Del M.D.   On: 10/28/2015 20:12   Mr Cervical Spine Wo Contrast  10/29/2015  CLINICAL DATA:  Initial evaluation for right-sided weakness. EXAM: MRI CERVICAL SPINE WITHOUT CONTRAST TECHNIQUE: Multiplanar, multisequence MR imaging of the cervical spine was performed. No intravenous contrast was administered. COMPARISON:  None. FINDINGS: Visualized portions of the brain demonstrate no acute abnormality. T2 hyperintensity within the pons like related to chronic small  vessel ischemic changes. Foramen magnum widely patent. Vertebral bodies are normally aligned with preservation of the normal cervical lordosis. Vertebral body heights well preserved. No fracture or listhesis. Diffusely decreased T1 signal intensity within the visualized vertebral body bone marrow, which may related to underlying anemia. No focal osseous lesion. No marrow edema. Signal intensity within the cervical spinal cord is normal. Previously questioned abnormality at the level of C3 not seen on this exam, this was likely artifactual on previous study. Paraspinous soft tissues demonstrate no acute abnormality. C2-C3: Mild bilateral uncovertebral hypertrophy. No significant stenosis. C3-C4: Bilateral uncovertebral spurring with minimal disc bulge. Mild bilateral foraminal stenosis. No significant canal narrowing. C4-C5: Bilateral uncovertebral spurring. Broad-based right paracentral disc bulge. Resultant moderate right foraminal narrowing. No significant canal or left foraminal stenosis. Mild bilateral facet arthrosis. C5-C6: Bilateral uncovertebral spurring with mild disc bulge. Moderate right with  more mild left foraminal narrowing. Posterior disc bulge minimally indents the ventral thecal sac without significant stenosis. C6-C7: Mild bilateral uncovertebral hypertrophy and facet arthrosis. Resultant moderate right foraminal narrowing. No significant left neural foraminal stenosis. Central canal widely patent. C7-T1: Mild bilateral uncovertebral hypertrophy. Minimal disc bulge. No significant stenosis. Visualized upper thoracic spine within normal limits. IMPRESSION: 1. No acute abnormality within the cervical spine. 2. Mild to moderate multilevel degenerative disc disease as above. There is resultant moderate right foraminal narrowing at C4-5, C5-6, and C6-7 with more mild right foraminal narrowing at C3-4. Mild left foraminal narrowing at C3-4. No significant canal stenosis. 3. Diffusely decreased T1 signal intensity throughout the visualized bone marrow, nonspecific, but may be related to patient's underlying anemia. Electronically Signed   By: Jeannine Boga M.D.   On: 10/29/2015 02:52    2D ECHO:   Disposition and Follow-up: Discharge Instructions    Increase activity slowly    Complete by:  As directed      Increase activity slowly    Complete by:  As directed             DISPOSITION: Outpatient PT   DISCHARGE FOLLOW-UP Follow-up Information    Schedule an appointment as soon as possible for a visit in 2 weeks to follow up.   Why:  for hospital follow-up   Contact information:   Please follow with your primary care physician       Time spent on Discharge: 55mins   Signed:   Zayven Powe M.D. [facility] 10/30/2015, 12:13 PM Pager: [phone]

## 2015-10-30 NOTE — Progress Notes (Signed)
CM received call from RN requesting a resource to secure a PCP for pt.  CM has placed [place] with instruction for pt to call, follow prompts to get a primary care physician.  Information is on Discharge instructions.

## 2015-10-30 NOTE — Evaluation (Signed)
Physical Therapy Evaluation Patient Details Name: [name] [name] MRN: [id number] DOB: 1956-09-24 Today's Date: 10/30/2015   History of Present Illness  60 year old right-hand dominant male with past medical history significant for ESRD on HD (T/TH/Sat), HTN, HLD, DM type 2, CAD s/p stenting, chronic CHF; who presents with complaints of right arm and leg weakness. MRI showed no acute infarct but did show cervical spine DDD.  Clinical Impression  Pt admitted with above diagnosis. Pt currently with functional limitations due to the deficits listed below (see PT Problem List). On eval, pt needed min guard assist for transfers and gait 150 feet with RW. No physical assist required. Pt will benefit from skilled PT to increase their independence and safety with mobility to allow discharge to the venue listed below.  Pt has RW and SPC for home use.     Follow Up Recommendations Outpatient PT    Equipment Recommendations  None recommended by PT    Recommendations for Other Services       Precautions / Restrictions Precautions Precautions: Fall Restrictions Weight Bearing Restrictions: No      Mobility  Bed Mobility Overal bed mobility: Modified Independent             General bed mobility comments: use of bed rails  Transfers Overall transfer level: Needs assistance Equipment used: Ambulation equipment used Transfers: Sit to/from Stand;Stand Pivot Transfers Sit to Stand: Min guard Stand pivot transfers: Min guard       General transfer comment: min guard for safety only  Ambulation/Gait Ambulation/Gait assistance: Min guard Ambulation Distance (Feet): 150 Feet Assistive device: Rolling walker (2 wheeled) Gait Pattern/deviations: Step-through pattern;Decreased stride length Gait velocity: decreased   General Gait Details: Ambulated in room without A.D. min guard assist 50 feet. No LOB noted. No physical assist needed. Pt prefers RW for longer distances.  Stairs             Wheelchair Mobility    Modified Rankin (Stroke Patients Only)       Balance Overall balance assessment: Needs assistance Sitting-balance support: No upper extremity supported;Feet supported Sitting balance-Leahy Scale: Good     Standing balance support: During functional activity;No upper extremity supported Standing balance-Leahy Scale: Fair                               Pertinent Vitals/Pain Pain Assessment: No/denies pain    Home Living Family/patient expects to be discharged to:: Private residence Living Arrangements: Children Available Help at Discharge: Available 24 hours/day;Family Type of Home: Apartment Home Access: Level entry     Home Layout: Two level;Able to live on main level with bedroom/bathroom Home Equipment: Gilford Rile - 2 wheels;Cane - single point      Prior Function Level of Independence: Independent with assistive device(s)               Hand Dominance   Dominant Hand: Right    Extremity/Trunk Assessment   Upper Extremity Assessment: Defer to OT evaluation           Lower Extremity Assessment: RLE deficits/detail RLE Deficits / Details: 4-/5 grossly graded hip and knee    Cervical / Trunk Assessment: Kyphotic  Communication   Communication: No difficulties  Cognition Arousal/Alertness: Awake/alert Behavior During Therapy: WFL for tasks assessed/performed Overall Cognitive Status: Within Functional Limits for tasks assessed                      General  Comments      Exercises        Assessment/Plan    PT Assessment Patient needs continued PT services  PT Diagnosis Difficulty walking;Generalized weakness   PT Problem List Decreased strength;Decreased activity tolerance;Decreased balance;Decreased mobility;Decreased safety awareness  PT Treatment Interventions DME instruction;Gait training;Functional mobility training;Therapeutic activities;Therapeutic exercise;Patient/family  education;Balance training   PT Goals (Current goals can be found in the Care Plan section) Acute Rehab PT Goals Patient Stated Goal: home PT Goal Formulation: With patient Time For Goal Achievement: 11/13/15 Potential to Achieve Goals: Good    Frequency Min 3X/week   Barriers to discharge        Co-evaluation               End of Session Equipment Utilized During Treatment: Gait belt Activity Tolerance: Patient tolerated treatment well Patient left: in chair;with call bell/phone within reach;with chair alarm set Nurse Communication: Mobility status         Time: [id number] PT Time Calculation (min) (ACUTE ONLY): 19 min   Charges:   PT Evaluation $PT Eval Low Complexity: 1 Procedure     PT G CodesLorriane Shire 10/30/2015, 11:13 AM

## 2015-10-30 NOTE — Progress Notes (Addendum)
[facility] Progress Note   Subjective: R arm is stronger, R leg not so much 4 kg off hd yest  Filed Vitals:   10/29/15 2139 10/30/15 0145 10/30/15 0605 10/30/15 1002  BP: 152/84 149/86 158/85 143/91  Pulse: 94 87 94 89  Temp: 98.3 F (36.8 C) 99.3 F (37.4 C) 98.3 F (36.8 C) 98.6 F (37 C)  TempSrc: Oral Oral Oral Oral  Resp: 18 18 19 18   Height:      Weight:      SpO2: 95% 98% 97% 94%    Inpatient medications: . amitriptyline  100 mg Oral QHS  . amLODipine  10 mg Oral Daily  . aspirin  81 mg Oral Daily  . atorvastatin  20 mg Oral QHS  . carvedilol  50 mg Oral BID WC  . cinacalcet  90 mg Oral Q breakfast  . clopidogrel  75 mg Oral Daily  . heparin  5,000 Units Subcutaneous 3 times per day  . insulin aspart  0-5 Units Subcutaneous QHS  . insulin aspart  0-9 Units Subcutaneous TID WC  . isosorbide mononitrate  30 mg Oral Daily  . losartan  100 mg Oral QHS  . multivitamin  1 tablet Oral QHS  . pantoprazole  40 mg Oral Daily  . pregabalin  50 mg Oral Daily  . sevelamer carbonate  3,200 mg Oral TID WC  . sucroferric oxyhydroxide  1,000 mg Oral TID WC  . venlafaxine XR  75 mg Oral Q breakfast     acetaminophen, hydrALAZINE, meclizine, nitroGLYCERIN, ondansetron (ZOFRAN) IV, senna-docusate, simethicone  Exam: Alert, calm, no distress No jvd Chest clear bilat RRR 2/6 sem no RG Abd soft ntnd no ascites Ext no edema, LUA AVF+bruit Neuro R arm 4/5, R leg 3/5 strength  [place] TTS  4h  500/800  90.5kg   2/2 bath  LUA AVF  Hep 8000 Mircera 50 ug to start 1/24 Venofer 50 mg /wk on thurs      Assessment: 1 Right-sided weakness, acute - unclear cause, a little better today. CT/MR neg for acute cva 2 ESRD TTS hd 3 HTN cont losart/ amlod/ carvedilol 4 Vol at dry wt now, no SOB and RA sat is good 5 DM on insulin 6 MBD stable 7 Anemia no esa, Hb up  Plan - as above   Kelly Splinter MD [facility] pager [phone]    cell  [phone] 10/30/2015, 12:17 PM    Recent Labs Lab 10/28/15 1522 10/28/15 1528 10/29/15 0640 10/30/15 0651  NA 138 137 138 136  K 4.5 4.6 4.2 4.2  CL 97* 102 99* 96*  CO2 22  --  24 27  GLUCOSE 102* 102* 91 85  BUN 22* 29* 27* 16  CREATININE 8.66* 8.20* 9.92* 6.59*  CALCIUM 9.8  --  9.5 9.5  PHOS  --   --  7.7*  --     Recent Labs Lab 10/28/15 1522 10/29/15 0640  AST 11*  --   ALT 7*  --   ALKPHOS 86  --   BILITOT 0.6  --   PROT 7.5  --   ALBUMIN 3.4* 3.3*    Recent Labs Lab 10/28/15 1522 10/28/15 1528 10/29/15 0640 10/30/15 0651  WBC 8.0  --  8.1 6.3  NEUTROABS 5.9  --   --   --   HGB 12.0* 13.9 11.7* 11.9*  HCT 38.5* 41.0 36.9* 38.0*  MCV 86.3  --  84.6 85.2  PLT 196  --  230 217      

## 2015-10-31 ENCOUNTER — Inpatient Hospital Stay (HOSPITAL_COMMUNITY): Payer: Medicare Other

## 2015-10-31 LAB — BASIC METABOLIC PANEL
Anion gap: 13 (ref 5–15)
BUN: 31 mg/dL — ABNORMAL HIGH (ref 6–20)
CALCIUM: 9.1 mg/dL (ref 8.9–10.3)
CO2: 27 mmol/L (ref 22–32)
CREATININE: 8.89 mg/dL — AB (ref 0.61–1.24)
Chloride: 95 mmol/L — ABNORMAL LOW (ref 101–111)
GFR calc non Af Amer: 6 mL/min — ABNORMAL LOW (ref >60)
GFR, EST AFRICAN AMERICAN: 7 mL/min — AB (ref >60)
Glucose, Bld: 98 mg/dL (ref 65–99)
Potassium: 4.5 mmol/L (ref 3.5–5.1)
SODIUM: 135 mmol/L (ref 135–145)

## 2015-10-31 LAB — GLUCOSE, CAPILLARY
GLUCOSE-CAPILLARY: 96 mg/dL (ref 65–99)
Glucose-Capillary: 123 mg/dL — ABNORMAL HIGH (ref 65–99)

## 2015-10-31 LAB — CBC
HCT: 37 % — ABNORMAL LOW (ref 39.0–52.0)
Hemoglobin: 11.7 g/dL — ABNORMAL LOW (ref 13.0–17.0)
MCH: 26.8 pg (ref 26.0–34.0)
MCHC: 31.6 g/dL (ref 30.0–36.0)
MCV: 84.9 fL (ref 78.0–100.0)
PLATELETS: 188 10*3/uL (ref 150–400)
RBC: 4.36 MIL/uL (ref 4.22–5.81)
RDW: 16.5 % — ABNORMAL HIGH (ref 11.5–15.5)
WBC: 6.7 10*3/uL (ref 4.0–10.5)

## 2015-10-31 LAB — HEMOGLOBIN A1C
Hgb A1c MFr Bld: 6.1 % — ABNORMAL HIGH (ref 4.8–5.6)
Mean Plasma Glucose: 128 mg/dL

## 2015-10-31 MED ORDER — METOCLOPRAMIDE HCL 5 MG PO TABS: 5.0000 mg | ORAL_TABLET | Freq: Three times a day (TID) | ORAL | Status: DC | PRN

## 2015-10-31 NOTE — Progress Notes (Signed)
Subjective:  No co / stated R sided weakness resolved walked yest. Tells me DC today after lunch if ok  Objective Vital signs in last 24 hours: Filed Vitals:   10/30/15 1841 10/30/15 2113 10/31/15 0544 10/31/15 1009  BP: 150/91 124/79 132/70 137/85  Pulse: 96 86 81 92  Temp: 98.1 F (36.7 C) 98.2 F (36.8 C) 97.5 F (36.4 C) 98.2 F (36.8 C)  TempSrc: Oral Oral Oral Oral  Resp: 18 18 16 18   Height:      Weight:      SpO2: 97% 94% 93% 97%   Weight change:   Physical Exam: General= alert / Ox 3 Chest = CTA  bilat   Card= RRR 2/6 sem no RG Abd= soft ntnd no ascites Ext =no edema, LUA AVF+bruit    OP HD= [facility] TTS 4h 500/800 90.5kg 2/2 bath LUA AVF Hep 8000 Mircera 50 ug to start 1/24 Venofer 50 mg /wk on thurs  Problem/Plan:  1 Right-sided weakness Acute = CT/MR neg for acute cva/ Neurology wu neg .""functional right hemiparesis""  ?? 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 Believe he needs his dry weight lowered if he will be compliant w coming to HD and staying on the machine.  Kelly Splinter MD [facility] pager [phone]    cell [phone] 10/31/2015, 12:09 PM    Labs: Basic Metabolic Panel:  Recent Labs Lab 10/29/15 0640 10/30/15 0651 10/31/15 0726  NA 138 136 135  K 4.2 4.2 4.5  CL 99* 96* 95*  CO2 24 27 27   GLUCOSE 91 85 98  BUN 27* 16 31*  CREATININE 9.92* 6.59* 8.89*  CALCIUM 9.5 9.5 9.1  PHOS 7.7*  --   --    Liver Function Tests:  Recent Labs Lab 10/28/15 1522 10/29/15 0640   AST 11*  --   ALT 7*  --   ALKPHOS 86  --   BILITOT 0.6  --   PROT 7.5  --   ALBUMIN 3.4* 3.3*   No results for input(s): LIPASE, AMYLASE in the last 168 hours. No results for input(s): AMMONIA in the last 168 hours. CBC:  Recent Labs Lab 10/28/15 1522  10/29/15 0640 10/30/15 0651 10/31/15 0726  WBC 8.0  --  8.1 6.3 6.7  NEUTROABS 5.9  --   --   --   --   HGB 12.0*  < > 11.7* 11.9* 11.7*  HCT 38.5*  < > 36.9* 38.0* 37.0*  MCV 86.3  --  84.6 85.2 84.9  PLT 196  --  230 217 188  < > = values in this interval not displayed. Cardiac Enzymes: No results for input(s): CKTOTAL, CKMB, CKMBINDEX, TROPONINI in the last 168 hours. CBG:  Recent Labs Lab 10/30/15 0639 10/30/15 1119 10/30/15 1658 10/30/15 2140 10/31/15 0629  GLUCAP 78 161* 119* 174* 96    Studies/Results: Ct Abdomen Pelvis Wo Contrast  10/30/2015  CLINICAL DATA:  Nausea and vomiting  today EXAM: CT ABDOMEN AND PELVIS WITHOUT CONTRAST TECHNIQUE: Multidetector CT imaging of the abdomen and pelvis was performed following the standard protocol without IV contrast. COMPARISON:  02/23/2015 FINDINGS: Lower chest: Mild ground-glass attenuation at both lung bases similar to prior study. Heavy mitral valve calcification. Hepatobiliary: Gallbladder is contracted with possible mild wall thickening. Pancreas: Normal Spleen: Normal Adrenals/Urinary Tract: Normal adrenal glands. Significant bilateral renal atrophy. 1 cm cyst interpolar right kidney. Known tiny bilateral cysts not seen on this noncontrast study. No stones. No hydronephrosis. Bladder negative. Stomach/Bowel: Negative Vascular/Lymphatic: Mild atherosclerotic aortoiliac calcification. Numerous retroperitoneal periaortic lymph nodes extending along the left iliac chain. The largest of these are seen in the proximal left iliac chain measuring 8 mm in short axis and continuing to demonstrate a normal prominent fatty hilum. Reproductive: No acute findings Other: No ascites  Musculoskeletal: No acute abnormalities IMPRESSION: Similar to prior study the gallbladder appears contracted with evidence of possible mild wall thickening and pericholecystic inflammation. Chronic cholecystitis not excluded. If indicated consider evaluating further with limited right upper quadrant ultrasound. Similar to prior study there is a mosaic ground-glass attenuation pattern to the lung bases suggesting nonspecific chronic lung disease. Other nonacute findings described above Electronically Signed   By: Skipper Cliche M.D.   On: 10/30/2015 21:05   Ct Head Wo Contrast  10/30/2015  CLINICAL DATA:  Right-sided weakness.  Headache. EXAM: CT HEAD WITHOUT CONTRAST TECHNIQUE: Contiguous axial images were obtained from the base of the skull through the vertex without intravenous contrast. COMPARISON:  MRI brain 10/28/2015.  CT head 10/28/2015. FINDINGS: Mild cerebral atrophy. No significant ventricular dilatation. Patchy low-attenuation changes in the deep white matter consistent with small vessel ischemia. No mass effect or midline shift. No abnormal extra-axial fluid collections. Gray-white matter junctions are distinct. Basal cisterns are not effaced. No evidence of acute intracranial hemorrhage. No depressed skull fractures. Visualized paranasal sinuses and mastoid air cells are not opacified. Vascular calcifications. IMPRESSION: No acute intracranial abnormalities. Mild chronic atrophy and small vessel ischemic changes. Electronically Signed   By: Lucienne Capers M.D.   On: 10/30/2015 20:46   Dg Abd Portable 1v  10/30/2015  CLINICAL DATA:  60 year old male with chronic nausea and vomiting. Subsequent encounter. EXAM: PORTABLE ABDOMEN - 1 VIEW COMPARISON:  CT Abdomen and Pelvis 02/23/2015. Chest radiographs 10/28/2015. FINDINGS: Portable AP supine view at 1555 hours. Non obstructed bowel gas pattern. Abdominal and pelvic visceral contours are within normal limits. Iliac artery vascular calcifications  and vas deferens calcifications re- identified in the pelvis. No acute osseous abnormality identified. No definite pneumoperitoneum on this supine view. IMPRESSION: Normal bowel gas pattern. Electronically Signed   By: Genevie Ann M.D.   On: 10/30/2015 16:13   US Abdomen Limited Ruq  10/31/2015  CLINICAL DATA:  One day of vomiting EXAM: US ABDOMEN LIMITED - RIGHT UPPER QUADRANT COMPARISON:  CT scan of the abdomen pelvis dated 30 October 2015. FINDINGS: Gallbladder: The gallbladder is adequately distended. No stones or sludge are evident. The gallbladder wall measures a maximum of 2.7 mm. There is no pericholecystic fluid or positive sonographic Murphy's sign. Common bile duct: Diameter: 4.5 mm Liver: The liver exhibits normal echotexture with no focal mass nor ductal dilation. The surface contour of the liver is smooth. IMPRESSION: 1. No sonographic evidence of acute gallbladder pathology. If gallbladder dysfunction remains suspected clinically, a nuclear medicine hepatobiliary scan may be a useful next imaging step. 2. Normal appearance of the common bile duct and liver. Electronically Signed  By: David  Martinique M.D.   On: 10/31/2015 08:33   Medications:   . amitriptyline  100 mg Oral QHS  . amLODipine  10 mg Oral Daily  . aspirin  81 mg Oral Daily  . atorvastatin  20 mg Oral QHS  . carvedilol  50 mg Oral BID WC  . cinacalcet  90 mg Oral Q breakfast  . clopidogrel  75 mg Oral Daily  . heparin  5,000 Units Subcutaneous 3 times per day  . insulin aspart  0-5 Units Subcutaneous QHS  . insulin aspart  0-9 Units Subcutaneous TID WC  . isosorbide mononitrate  30 mg Oral Daily  . losartan  100 mg Oral QHS  . multivitamin  1 tablet Oral QHS  . pantoprazole  40 mg Oral Daily  . pregabalin  50 mg Oral Daily  . sevelamer carbonate  3,200 mg Oral TID WC  . sucroferric oxyhydroxide  1,000 mg Oral TID WC  . venlafaxine XR  75 mg Oral Q breakfast

## 2015-10-31 NOTE — Care Management Note (Signed)
Case Management Note  Patient Details  Name: [name] [name] MRN: [id number] Date of Birth: 04-28-1956  Subjective/Objective:                    Action/Plan: Plan is for patient to be discharged today with self care. PT recommending outpatient PT. Patient will receive prescription for outpatient therapy per MD by RN with his discharge packet. No PCP listed. CM spoke with [name] and he states he goes to the [facility] clinic. Patient also states he does not have transportation home today. CSW provided him a taxi voucher and it was delivered to his bedside RN per CM.   Expected Discharge Date:                  Expected Discharge Plan:  Home/Self Care  In-House Referral:     Discharge planning Services     Post Acute Care Choice:    Choice offered to:     DME Arranged:    DME Agency:     HH Arranged:    [place] Agency:     Status of Service:  Completed, signed off  Medicare Important Message Given:    Date Medicare IM Given:    Medicare IM give by:    Date Additional Medicare IM Given:    Additional Medicare Important Message give by:     If discussed at [place] of Stay Meetings, dates discussed:    Additional Comments:  Pollie Friar, RN 10/31/2015, 11:42 AM

## 2015-10-31 NOTE — Care Management Important Message (Signed)
Important Message  Patient Details  Name: [name] [name] MRN: [id number] Date of Birth: 03/01/1956   Medicare Important Message Given:  Yes    Nathen May 10/31/2015, 3:42 PM

## 2015-10-31 NOTE — Discharge Summary (Signed)
Physician Discharge Summary   Patient ID: [name] [name] MRN: [id number] DOB/AGE: 10-29-1955 60 y.o.  Admit date: 10/28/2015 Discharge date: 10/31/2015  Primary Care Physician:  [name]  Discharge Diagnoses:    . Acute right-sided weakness, unclear etiology, functional component . accelerated hypertension  . ESRD on hemodialysis TTS  . Diastolic dysfunction-grade 2 . Volume overload   Nausea, vomiting   Consults: Nephrology Neurology  Recommendations for Outpatient Follow-up:  1. Patient was given prescription for outpatient PT 2. Given prescription for antihypertensives   DIET: Renal diet    Allergies:   Allergies  Allergen Reactions  . Enalapril Hives     DISCHARGE MEDICATIONS: Current Discharge Medication List    START taking these medications   Details  losartan (COZAAR) 100 MG tablet Take 1 tablet (100 mg total) by mouth at bedtime. Qty: 30 tablet, Refills: 3    metoCLOPramide (REGLAN) 5 MG tablet Take 1 tablet (5 mg total) by mouth every 8 (eight) hours as needed for nausea or vomiting. Qty: 30 tablet, Refills: 0    UNABLE TO FIND Outpatient physical therapy  Diagnosis: generalized debility, ESRD on hemodialysis Qty: 1 Mutually Defined, Refills: 0      CONTINUE these medications which have CHANGED   Details  amLODipine (NORVASC) 10 MG tablet Take 1 tablet (10 mg total) by mouth daily. Qty: 30 tablet, Refills: 3    carvedilol (COREG) 25 MG tablet Take 2 tablets (50 mg total) by mouth 2 (two) times daily. Qty: 60 tablet, Refills: 3    isosorbide mononitrate (IMDUR) 30 MG 24 hr tablet Take 1 tablet (30 mg total) by mouth daily. Qty: 30 tablet, Refills: 3      CONTINUE these medications which have NOT CHANGED   Details  acetaminophen (TYLENOL) 500 MG tablet Take 500 mg by mouth every 6 (six) hours as needed for headache.     amitriptyline (ELAVIL) 100 MG tablet Take 1 tablet (100 mg total) by mouth at bedtime. Qty: 30 tablet, Refills:  2    aspirin 81 MG chewable tablet Chew 1 tablet (81 mg total) by mouth daily.    atorvastatin (LIPITOR) 20 MG tablet Take 20 mg by mouth at bedtime.     cinacalcet (SENSIPAR) 90 MG tablet Take 90 mg by mouth at bedtime.     clopidogrel (PLAVIX) 75 MG tablet Take 1 tablet (75 mg total) by mouth daily. Qty: 30 tablet, Refills: 11    doxercalciferol (HECTOROL) 4 MCG/2ML injection Inject 1 mL (2 mcg total) into the vein Every Tuesday,Thursday,and Saturday with dialysis. Qty: 2 mL    ferric gluconate 62.5 mg in sodium chloride 0.9 % 100 mL Inject 62.5 mg into the vein every Thursday with hemodialysis.    meclizine (ANTIVERT) 25 MG tablet Take 25 mg by mouth 3 (three) times daily as needed for dizziness.    multivitamin (RENA-VIT) TABS tablet Take 1 tablet by mouth at bedtime. Qty: 30 tablet, Refills: 0    nitroGLYCERIN (NITROSTAT) 0.4 MG SL tablet Place 1 tablet (0.4 mg total) under the tongue every 5 (five) minutes as needed for chest pain. Qty: 30 tablet, Refills: 0    pantoprazole (PROTONIX) 40 MG tablet Take 1 tablet (40 mg total) by mouth daily. Qty: 30 tablet, Refills: 0    pregabalin (LYRICA) 50 MG capsule Take 1 capsule (50 mg total) by mouth daily. Qty: 30 capsule, Refills: 0    sevelamer carbonate (RENVELA) 800 MG tablet Take 2,400 mg by mouth 3 (three) times daily  with meals.     simethicone (GAS-X) 80 MG chewable tablet Chew 1 tablet (80 mg total) by mouth every 6 (six) hours as needed for flatulence. Qty: 60 tablet, Refills: 0    venlafaxine XR (EFFEXOR XR) 75 MG 24 hr capsule Take 1 capsule (75 mg total) by mouth daily with breakfast. Qty: 30 capsule, Refills: 2    Darbepoetin Alfa (ARANESP) 25 MCG/0.42ML SOSY injection Inject 0.42 mLs (25 mcg total) into the vein every Tuesday with hemodialysis. Qty: 14.28 mL         Brief H and P: For complete details please refer to admission H and P, but in brief Patient is a 60 year old maleESRD on HD (T/TH/Sat), HTN, HLD,  DM type 2, CAD s/p stenting, chronic CHF; who presented with complaints of right arm and leg weakness. Patient reported that he woke up around 3 AM and felt weaker on his right side. In ED, CT of the brain showed no acute abnormalities. Neurology was consulted and per recommendation likely functional weakness. MRI brain showed no acute infarct. Of note, patient's BP has been also elevated since admission, 181/105 in ED.  Hospital Course:  Acute right-sided weakness likely due to malignant hypertension:  - Unclear etiology, patient was brought in as code stroke, seen by neurology, Dr. Aram Beecham who felt strongly that patient had functional component on exam. MRI of the brain showed no acute infarct. Neurology did not recommend full stroke Workup as MRI was negative and patient had inconsistent exam. - PTOT evaluation recommended outpatient PT - Patient could have right-sided weakness due to malignant hypertension. - MRI C-spine showed mild to moderate cervical spine disc degenerative disease - Continue aspirin and Plavix, LDL 65  ESRD on hemodialysis: TTS - Nephrology consulted, discussed with Dr. Melvia Heaps, patient underwent hemodialysis on 10/29/15. Dialysis on Tuesday. - Continue Sensipar, Renvela  Diabetes mellitus type 2: Last hemoglobin A1c was 6 in August 2016. - Diet controlled   malignant hypertension: Uncontrolled. Systolic blood pressures over 200 at times over night. -Patient was placed on hydralazine IV as needed and labetalol 1. - Restarted on amlodipine, Coreg, , added losartan, Imdur - Dialysis will help with BP  Coronary artery disease s/p percutaneous intervention - Continue aspirin, Plavix  Systolic congestive heart failure EF 45-50% on echo in 05/2015 with grade 2 diastolic dysfunction - Continue isosorbide mononitrate - Volume control with HD  GERD - Protonix  Nausea, vomiting: Unclear etiology, possible gastroparesis - Patient had multiple episodes of nausea and  vomiting prior to the planned discharge on 1/22. Patient underwent KUB, CT abdomen and pelvis which were essentially negative. Right quadrant ultrasound was negative for acute cholecystitis. CT head was also negative for any acute cerebellar stroke. Patient is not tolerating solid diet.    Day of Discharge No complaints, tolerating solid diet. No nausea, vomiting. No headache. Overall improving. Stable to go home.  BP 137/85 mmHg  Pulse 92  Temp(Src) 98.2 F (36.8 C) (Oral)  Resp 18  Ht 6' (1.829 m)  Wt 90.6 kg (199 lb 11.8 oz)  BMI 27.08 kg/m2  SpO2 97%  Physical Exam: General: Alert and awake oriented x3 not in any acute distress. HEENT: anicteric sclera, pupils reactive to light and accommodation CVS: S1-S2 clear no murmur rubs or gallops Chest: clear to auscultation bilaterally, no wheezing rales or rhonchi Abdomen: soft nontender, nondistended, normal bowel sounds Extremities: no cyanosis, clubbing or edema noted bilaterally    The results of significant diagnostics from this hospitalization (including imaging, microbiology,  ancillary and laboratory) are listed below for reference.    LAB RESULTS: Basic Metabolic Panel:  Recent Labs Lab 10/29/15 0640 10/30/15 0651 10/31/15 0726  NA 138 136 135  K 4.2 4.2 4.5  CL 99* 96* 95*  CO2 24 27 27   GLUCOSE 91 85 98  BUN 27* 16 31*  CREATININE 9.92* 6.59* 8.89*  CALCIUM 9.5 9.5 9.1  PHOS 7.7*  --   --    Liver Function Tests:  Recent Labs Lab 10/28/15 1522 10/29/15 0640  AST 11*  --   ALT 7*  --   ALKPHOS 86  --   BILITOT 0.6  --   PROT 7.5  --   ALBUMIN 3.4* 3.3*   No results for input(s): LIPASE, AMYLASE in the last 168 hours. No results for input(s): AMMONIA in the last 168 hours. CBC:  Recent Labs Lab 10/28/15 1522  10/30/15 0651 10/31/15 0726  WBC 8.0  < > 6.3 6.7  NEUTROABS 5.9  --   --   --   HGB 12.0*  < > 11.9* 11.7*  HCT 38.5*  < > 38.0* 37.0*  MCV 86.3  < > 85.2 84.9  PLT 196  < > 217 188   < > = values in this interval not displayed. Cardiac Enzymes: No results for input(s): CKTOTAL, CKMB, CKMBINDEX, TROPONINI in the last 168 hours. BNP: Invalid input(s): POCBNP CBG:  Recent Labs Lab 10/31/15 0629 10/31/15 1123  GLUCAP 96 123*    Significant Diagnostic Studies:  Dg Chest 2 View  10/28/2015  CLINICAL DATA:  Right arm and leg weakness. EXAM: CHEST  2 VIEW COMPARISON:  October 18, 2015. FINDINGS: Stable cardiomediastinal silhouette. Mild central pulmonary vascular congestion is noted with bilateral pulmonary edema. No pneumothorax or pleural effusion is noted. Bony thorax is unremarkable. IMPRESSION: Mild central pulmonary vascular congestion with bilateral pulmonary edema. Electronically Signed   By: Marijo Conception, M.D.   On: 10/28/2015 16:30   Ct Head Wo Contrast  10/28/2015  CLINICAL DATA:  Right-sided weakness today EXAM: CT HEAD WITHOUT CONTRAST TECHNIQUE: Contiguous axial images were obtained from the base of the skull through the vertex without intravenous contrast. COMPARISON:  04/01/2015 FINDINGS: No skull fracture. No significant inflammatory change in the sinuses. No hemorrhage or extra-axial fluid. No hydrocephalus. Mild atrophy. Moderate diffuse low attenuation in the deep white matter. No evidence of mass or vascular territory infarct. IMPRESSION: Stable chronic involutional change with no acute findings Electronically Signed   By: Skipper Cliche M.D.   On: 10/28/2015 15:38   Mr Brain Wo Contrast  10/28/2015  CLINICAL DATA:  60 year old diabetic hypertensive male on dialysis with hyperlipidemia presenting with right-sided weakness and headache. Subsequent encounter. EXAM: MRI HEAD WITHOUT CONTRAST TECHNIQUE: Multiplanar, multiecho pulse sequences of the brain and surrounding structures were obtained without intravenous contrast. COMPARISON:  10/28/2015 CT and 04/01/2015 MR FINDINGS: No acute infarct, intracranial hemorrhage or hydrocephalus. Remote infarct of the  thalamus bilaterally. Remote posterior right frontal lobe smaller infarct. Prominent small vessel disease changes most notable periventricular region and within the pons. No intracranial mass lesion noted on this unenhanced exam. Major intracranial vascular structures are patent. Moderate exophthalmos. Symmetric normal appearance of the extra-ocular muscles. Question abnormality within the cord at the C3 level. Cervical spine MR can be obtained for further delineation. Cervical medullary junction, pituitary region and fine-needle region within normal limits. IMPRESSION: No acute infarct or hydrocephalus. Remote infarct of the thalamus bilaterally. Remote posterior right frontal lobe smaller infarct. Prominent  small vessel disease changes most notable periventricular region and within the pons. No intracranial mass lesion noted on this unenhanced exam. Moderate exophthalmos. Symmetric normal appearance of the extra-ocular muscles. Question abnormality within the cord at the C3 level. Cervical spine MR can be obtained for further delineation. Electronically Signed   By: Genia Del M.D.   On: 10/28/2015 20:12   Mr Cervical Spine Wo Contrast  10/29/2015  CLINICAL DATA:  Initial evaluation for right-sided weakness. EXAM: MRI CERVICAL SPINE WITHOUT CONTRAST TECHNIQUE: Multiplanar, multisequence MR imaging of the cervical spine was performed. No intravenous contrast was administered. COMPARISON:  None. FINDINGS: Visualized portions of the brain demonstrate no acute abnormality. T2 hyperintensity within the pons like related to chronic small vessel ischemic changes. Foramen magnum widely patent. Vertebral bodies are normally aligned with preservation of the normal cervical lordosis. Vertebral body heights well preserved. No fracture or listhesis. Diffusely decreased T1 signal intensity within the visualized vertebral body bone marrow, which may related to underlying anemia. No focal osseous lesion. No marrow edema.  Signal intensity within the cervical spinal cord is normal. Previously questioned abnormality at the level of C3 not seen on this exam, this was likely artifactual on previous study. Paraspinous soft tissues demonstrate no acute abnormality. C2-C3: Mild bilateral uncovertebral hypertrophy. No significant stenosis. C3-C4: Bilateral uncovertebral spurring with minimal disc bulge. Mild bilateral foraminal stenosis. No significant canal narrowing. C4-C5: Bilateral uncovertebral spurring. Broad-based right paracentral disc bulge. Resultant moderate right foraminal narrowing. No significant canal or left foraminal stenosis. Mild bilateral facet arthrosis. C5-C6: Bilateral uncovertebral spurring with mild disc bulge. Moderate right with more mild left foraminal narrowing. Posterior disc bulge minimally indents the ventral thecal sac without significant stenosis. C6-C7: Mild bilateral uncovertebral hypertrophy and facet arthrosis. Resultant moderate right foraminal narrowing. No significant left neural foraminal stenosis. Central canal widely patent. C7-T1: Mild bilateral uncovertebral hypertrophy. Minimal disc bulge. No significant stenosis. Visualized upper thoracic spine within normal limits. IMPRESSION: 1. No acute abnormality within the cervical spine. 2. Mild to moderate multilevel degenerative disc disease as above. There is resultant moderate right foraminal narrowing at C4-5, C5-6, and C6-7 with more mild right foraminal narrowing at C3-4. Mild left foraminal narrowing at C3-4. No significant canal stenosis. 3. Diffusely decreased T1 signal intensity throughout the visualized bone marrow, nonspecific, but may be related to patient's underlying anemia. Electronically Signed   By: Jeannine Boga M.D.   On: 10/29/2015 02:52    2D ECHO:   Disposition and Follow-up: Discharge Instructions    Increase activity slowly    Complete by:  As directed      Increase activity slowly    Complete by:  As directed              DISPOSITION: Outpatient PT   DISCHARGE FOLLOW-UP Follow-up Information    Schedule an appointment as soon as possible for a visit in 2 weeks to follow up.   Why:  for hospital follow-up   Contact information:   Please follow with your primary care physician      Follow up with [facility].   Why:  Call this number and follow the prompts to get a primary care physician   Contact information:   [phone]       Time spent on Discharge: 53mins   Signed:   RAI,RIPUDEEP M.D. [facility] 10/31/2015, 12:52 PM Pager: [phone]

## 2015-10-31 NOTE — Progress Notes (Signed)
Discharge orders received. Pt educated on discharge instructions. Pt verbalized understanding. Pt given discharge packet and prescriptions. Pt given prescription for outpatient physical therapy. IV and tele removed. Pt given taxi voucher. RN called taxi and assisted patient into taxi.

## 2015-11-01 DIAGNOSIS — D631 Anemia in chronic kidney disease: Secondary | ICD-10-CM | POA: Diagnosis not present

## 2015-11-01 DIAGNOSIS — N186 End stage renal disease: Secondary | ICD-10-CM | POA: Diagnosis not present

## 2015-11-01 DIAGNOSIS — E119 Type 2 diabetes mellitus without complications: Secondary | ICD-10-CM | POA: Diagnosis not present

## 2015-11-01 DIAGNOSIS — D509 Iron deficiency anemia, unspecified: Secondary | ICD-10-CM | POA: Diagnosis not present

## 2015-11-01 DIAGNOSIS — N2581 Secondary hyperparathyroidism of renal origin: Secondary | ICD-10-CM | POA: Diagnosis not present

## 2015-11-03 DIAGNOSIS — N2581 Secondary hyperparathyroidism of renal origin: Secondary | ICD-10-CM | POA: Diagnosis not present

## 2015-11-03 DIAGNOSIS — D631 Anemia in chronic kidney disease: Secondary | ICD-10-CM | POA: Diagnosis not present

## 2015-11-03 DIAGNOSIS — N186 End stage renal disease: Secondary | ICD-10-CM | POA: Diagnosis not present

## 2015-11-03 DIAGNOSIS — D509 Iron deficiency anemia, unspecified: Secondary | ICD-10-CM | POA: Diagnosis not present

## 2015-11-03 DIAGNOSIS — E119 Type 2 diabetes mellitus without complications: Secondary | ICD-10-CM | POA: Diagnosis not present

## 2015-11-05 DIAGNOSIS — N186 End stage renal disease: Secondary | ICD-10-CM | POA: Diagnosis not present

## 2015-11-05 DIAGNOSIS — E119 Type 2 diabetes mellitus without complications: Secondary | ICD-10-CM | POA: Diagnosis not present

## 2015-11-05 DIAGNOSIS — D509 Iron deficiency anemia, unspecified: Secondary | ICD-10-CM | POA: Diagnosis not present

## 2015-11-05 DIAGNOSIS — N2581 Secondary hyperparathyroidism of renal origin: Secondary | ICD-10-CM | POA: Diagnosis not present

## 2015-11-05 DIAGNOSIS — D631 Anemia in chronic kidney disease: Secondary | ICD-10-CM | POA: Diagnosis not present

## 2015-11-06 DIAGNOSIS — N186 End stage renal disease: Secondary | ICD-10-CM | POA: Diagnosis not present

## 2015-11-06 DIAGNOSIS — R05 Cough: Secondary | ICD-10-CM | POA: Diagnosis not present

## 2015-11-06 DIAGNOSIS — Z992 Dependence on renal dialysis: Secondary | ICD-10-CM | POA: Diagnosis not present

## 2015-11-06 DIAGNOSIS — B370 Candidal stomatitis: Secondary | ICD-10-CM | POA: Diagnosis not present

## 2015-11-06 DIAGNOSIS — R0789 Other chest pain: Secondary | ICD-10-CM | POA: Diagnosis not present

## 2015-11-06 DIAGNOSIS — I10 Essential (primary) hypertension: Secondary | ICD-10-CM | POA: Diagnosis not present

## 2015-11-06 DIAGNOSIS — Z794 Long term (current) use of insulin: Secondary | ICD-10-CM | POA: Diagnosis not present

## 2015-11-06 DIAGNOSIS — K219 Gastro-esophageal reflux disease without esophagitis: Secondary | ICD-10-CM | POA: Diagnosis not present

## 2015-11-06 DIAGNOSIS — E7800 Pure hypercholesterolemia, unspecified: Secondary | ICD-10-CM | POA: Diagnosis not present

## 2015-11-06 DIAGNOSIS — I120 Hypertensive chronic kidney disease with stage 5 chronic kidney disease or end stage renal disease: Secondary | ICD-10-CM | POA: Diagnosis not present

## 2015-11-06 DIAGNOSIS — R079 Chest pain, unspecified: Secondary | ICD-10-CM | POA: Diagnosis not present

## 2015-11-06 DIAGNOSIS — I5043 Acute on chronic combined systolic (congestive) and diastolic (congestive) heart failure: Secondary | ICD-10-CM | POA: Diagnosis not present

## 2015-11-06 DIAGNOSIS — E1139 Type 2 diabetes mellitus with other diabetic ophthalmic complication: Secondary | ICD-10-CM | POA: Diagnosis not present

## 2015-11-07 DIAGNOSIS — Z7902 Long term (current) use of antithrombotics/antiplatelets: Secondary | ICD-10-CM | POA: Diagnosis not present

## 2015-11-07 DIAGNOSIS — J180 Bronchopneumonia, unspecified organism: Secondary | ICD-10-CM | POA: Diagnosis not present

## 2015-11-07 DIAGNOSIS — M9080 Osteopathy in diseases classified elsewhere, unspecified site: Secondary | ICD-10-CM | POA: Diagnosis not present

## 2015-11-07 DIAGNOSIS — K219 Gastro-esophageal reflux disease without esophagitis: Secondary | ICD-10-CM | POA: Diagnosis not present

## 2015-11-07 DIAGNOSIS — Z7982 Long term (current) use of aspirin: Secondary | ICD-10-CM | POA: Diagnosis not present

## 2015-11-07 DIAGNOSIS — E1139 Type 2 diabetes mellitus with other diabetic ophthalmic complication: Secondary | ICD-10-CM | POA: Diagnosis not present

## 2015-11-07 DIAGNOSIS — I361 Nonrheumatic tricuspid (valve) insufficiency: Secondary | ICD-10-CM | POA: Diagnosis not present

## 2015-11-07 DIAGNOSIS — I515 Myocardial degeneration: Secondary | ICD-10-CM | POA: Diagnosis not present

## 2015-11-07 DIAGNOSIS — N186 End stage renal disease: Secondary | ICD-10-CM | POA: Diagnosis present

## 2015-11-07 DIAGNOSIS — R079 Chest pain, unspecified: Secondary | ICD-10-CM | POA: Diagnosis not present

## 2015-11-07 DIAGNOSIS — D631 Anemia in chronic kidney disease: Secondary | ICD-10-CM | POA: Diagnosis not present

## 2015-11-07 DIAGNOSIS — Z79899 Other long term (current) drug therapy: Secondary | ICD-10-CM | POA: Diagnosis not present

## 2015-11-07 DIAGNOSIS — I132 Hypertensive heart and chronic kidney disease with heart failure and with stage 5 chronic kidney disease, or end stage renal disease: Secondary | ICD-10-CM | POA: Diagnosis present

## 2015-11-07 DIAGNOSIS — I340 Nonrheumatic mitral (valve) insufficiency: Secondary | ICD-10-CM | POA: Diagnosis not present

## 2015-11-07 DIAGNOSIS — I5042 Chronic combined systolic (congestive) and diastolic (congestive) heart failure: Secondary | ICD-10-CM | POA: Diagnosis not present

## 2015-11-07 DIAGNOSIS — R05 Cough: Secondary | ICD-10-CM | POA: Diagnosis not present

## 2015-11-07 DIAGNOSIS — E7800 Pure hypercholesterolemia, unspecified: Secondary | ICD-10-CM | POA: Diagnosis present

## 2015-11-07 DIAGNOSIS — I120 Hypertensive chronic kidney disease with stage 5 chronic kidney disease or end stage renal disease: Secondary | ICD-10-CM | POA: Diagnosis not present

## 2015-11-07 DIAGNOSIS — Z888 Allergy status to other drugs, medicaments and biological substances status: Secondary | ICD-10-CM | POA: Diagnosis not present

## 2015-11-07 DIAGNOSIS — R600 Localized edema: Secondary | ICD-10-CM | POA: Diagnosis not present

## 2015-11-07 DIAGNOSIS — I2510 Atherosclerotic heart disease of native coronary artery without angina pectoris: Secondary | ICD-10-CM | POA: Diagnosis present

## 2015-11-07 DIAGNOSIS — R0789 Other chest pain: Secondary | ICD-10-CM | POA: Diagnosis not present

## 2015-11-07 DIAGNOSIS — Z794 Long term (current) use of insulin: Secondary | ICD-10-CM | POA: Diagnosis not present

## 2015-11-07 DIAGNOSIS — F329 Major depressive disorder, single episode, unspecified: Secondary | ICD-10-CM | POA: Diagnosis present

## 2015-11-07 DIAGNOSIS — E889 Metabolic disorder, unspecified: Secondary | ICD-10-CM | POA: Diagnosis not present

## 2015-11-07 DIAGNOSIS — Z992 Dependence on renal dialysis: Secondary | ICD-10-CM | POA: Diagnosis not present

## 2015-11-07 DIAGNOSIS — E119 Type 2 diabetes mellitus without complications: Secondary | ICD-10-CM | POA: Diagnosis not present

## 2015-11-07 DIAGNOSIS — Z87891 Personal history of nicotine dependence: Secondary | ICD-10-CM | POA: Diagnosis not present

## 2015-11-07 DIAGNOSIS — E1122 Type 2 diabetes mellitus with diabetic chronic kidney disease: Secondary | ICD-10-CM | POA: Diagnosis present

## 2015-11-07 DIAGNOSIS — B370 Candidal stomatitis: Secondary | ICD-10-CM | POA: Diagnosis not present

## 2015-11-07 DIAGNOSIS — E875 Hyperkalemia: Secondary | ICD-10-CM | POA: Diagnosis not present

## 2015-11-08 DIAGNOSIS — Z992 Dependence on renal dialysis: Secondary | ICD-10-CM | POA: Diagnosis not present

## 2015-11-08 DIAGNOSIS — E1122 Type 2 diabetes mellitus with diabetic chronic kidney disease: Secondary | ICD-10-CM | POA: Diagnosis not present

## 2015-11-08 DIAGNOSIS — N186 End stage renal disease: Secondary | ICD-10-CM | POA: Diagnosis not present

## 2015-11-10 DIAGNOSIS — E119 Type 2 diabetes mellitus without complications: Secondary | ICD-10-CM | POA: Diagnosis not present

## 2015-11-10 DIAGNOSIS — N2581 Secondary hyperparathyroidism of renal origin: Secondary | ICD-10-CM | POA: Diagnosis not present

## 2015-11-10 DIAGNOSIS — D509 Iron deficiency anemia, unspecified: Secondary | ICD-10-CM | POA: Diagnosis not present

## 2015-11-10 DIAGNOSIS — N186 End stage renal disease: Secondary | ICD-10-CM | POA: Diagnosis not present

## 2015-11-10 DIAGNOSIS — D631 Anemia in chronic kidney disease: Secondary | ICD-10-CM | POA: Diagnosis not present

## 2015-11-12 DIAGNOSIS — N2581 Secondary hyperparathyroidism of renal origin: Secondary | ICD-10-CM | POA: Diagnosis not present

## 2015-11-12 DIAGNOSIS — D509 Iron deficiency anemia, unspecified: Secondary | ICD-10-CM | POA: Diagnosis not present

## 2015-11-12 DIAGNOSIS — D631 Anemia in chronic kidney disease: Secondary | ICD-10-CM | POA: Diagnosis not present

## 2015-11-12 DIAGNOSIS — E119 Type 2 diabetes mellitus without complications: Secondary | ICD-10-CM | POA: Diagnosis not present

## 2015-11-12 DIAGNOSIS — N186 End stage renal disease: Secondary | ICD-10-CM | POA: Diagnosis not present

## 2015-11-15 DIAGNOSIS — N2581 Secondary hyperparathyroidism of renal origin: Secondary | ICD-10-CM | POA: Diagnosis not present

## 2015-11-15 DIAGNOSIS — E119 Type 2 diabetes mellitus without complications: Secondary | ICD-10-CM | POA: Diagnosis not present

## 2015-11-15 DIAGNOSIS — N186 End stage renal disease: Secondary | ICD-10-CM | POA: Diagnosis not present

## 2015-11-15 DIAGNOSIS — D509 Iron deficiency anemia, unspecified: Secondary | ICD-10-CM | POA: Diagnosis not present

## 2015-11-15 DIAGNOSIS — D631 Anemia in chronic kidney disease: Secondary | ICD-10-CM | POA: Diagnosis not present

## 2015-11-17 DIAGNOSIS — D631 Anemia in chronic kidney disease: Secondary | ICD-10-CM | POA: Diagnosis not present

## 2015-11-17 DIAGNOSIS — N2581 Secondary hyperparathyroidism of renal origin: Secondary | ICD-10-CM | POA: Diagnosis not present

## 2015-11-17 DIAGNOSIS — N186 End stage renal disease: Secondary | ICD-10-CM | POA: Diagnosis not present

## 2015-11-17 DIAGNOSIS — D509 Iron deficiency anemia, unspecified: Secondary | ICD-10-CM | POA: Diagnosis not present

## 2015-11-17 DIAGNOSIS — E119 Type 2 diabetes mellitus without complications: Secondary | ICD-10-CM | POA: Diagnosis not present

## 2015-11-22 DIAGNOSIS — N2581 Secondary hyperparathyroidism of renal origin: Secondary | ICD-10-CM | POA: Diagnosis not present

## 2015-11-22 DIAGNOSIS — D631 Anemia in chronic kidney disease: Secondary | ICD-10-CM | POA: Diagnosis not present

## 2015-11-22 DIAGNOSIS — N186 End stage renal disease: Secondary | ICD-10-CM | POA: Diagnosis not present

## 2015-11-22 DIAGNOSIS — E119 Type 2 diabetes mellitus without complications: Secondary | ICD-10-CM | POA: Diagnosis not present

## 2015-11-22 DIAGNOSIS — D509 Iron deficiency anemia, unspecified: Secondary | ICD-10-CM | POA: Diagnosis not present

## 2015-11-24 DIAGNOSIS — D509 Iron deficiency anemia, unspecified: Secondary | ICD-10-CM | POA: Diagnosis not present

## 2015-11-24 DIAGNOSIS — N2581 Secondary hyperparathyroidism of renal origin: Secondary | ICD-10-CM | POA: Diagnosis not present

## 2015-11-24 DIAGNOSIS — N186 End stage renal disease: Secondary | ICD-10-CM | POA: Diagnosis not present

## 2015-11-24 DIAGNOSIS — E119 Type 2 diabetes mellitus without complications: Secondary | ICD-10-CM | POA: Diagnosis not present

## 2015-11-24 DIAGNOSIS — D631 Anemia in chronic kidney disease: Secondary | ICD-10-CM | POA: Diagnosis not present

## 2015-11-26 DIAGNOSIS — E119 Type 2 diabetes mellitus without complications: Secondary | ICD-10-CM | POA: Diagnosis not present

## 2015-11-26 DIAGNOSIS — D509 Iron deficiency anemia, unspecified: Secondary | ICD-10-CM | POA: Diagnosis not present

## 2015-11-26 DIAGNOSIS — N2581 Secondary hyperparathyroidism of renal origin: Secondary | ICD-10-CM | POA: Diagnosis not present

## 2015-11-26 DIAGNOSIS — N186 End stage renal disease: Secondary | ICD-10-CM | POA: Diagnosis not present

## 2015-11-26 DIAGNOSIS — D631 Anemia in chronic kidney disease: Secondary | ICD-10-CM | POA: Diagnosis not present

## 2015-11-29 DIAGNOSIS — N2581 Secondary hyperparathyroidism of renal origin: Secondary | ICD-10-CM | POA: Diagnosis not present

## 2015-11-29 DIAGNOSIS — E119 Type 2 diabetes mellitus without complications: Secondary | ICD-10-CM | POA: Diagnosis not present

## 2015-11-29 DIAGNOSIS — D631 Anemia in chronic kidney disease: Secondary | ICD-10-CM | POA: Diagnosis not present

## 2015-11-29 DIAGNOSIS — D509 Iron deficiency anemia, unspecified: Secondary | ICD-10-CM | POA: Diagnosis not present

## 2015-11-29 DIAGNOSIS — N186 End stage renal disease: Secondary | ICD-10-CM | POA: Diagnosis not present

## 2015-12-01 DIAGNOSIS — N186 End stage renal disease: Secondary | ICD-10-CM | POA: Diagnosis not present

## 2015-12-01 DIAGNOSIS — D509 Iron deficiency anemia, unspecified: Secondary | ICD-10-CM | POA: Diagnosis not present

## 2015-12-01 DIAGNOSIS — D631 Anemia in chronic kidney disease: Secondary | ICD-10-CM | POA: Diagnosis not present

## 2015-12-01 DIAGNOSIS — N2581 Secondary hyperparathyroidism of renal origin: Secondary | ICD-10-CM | POA: Diagnosis not present

## 2015-12-01 DIAGNOSIS — E119 Type 2 diabetes mellitus without complications: Secondary | ICD-10-CM | POA: Diagnosis not present

## 2015-12-03 DIAGNOSIS — N186 End stage renal disease: Secondary | ICD-10-CM | POA: Diagnosis not present

## 2015-12-03 DIAGNOSIS — D509 Iron deficiency anemia, unspecified: Secondary | ICD-10-CM | POA: Diagnosis not present

## 2015-12-03 DIAGNOSIS — E119 Type 2 diabetes mellitus without complications: Secondary | ICD-10-CM | POA: Diagnosis not present

## 2015-12-03 DIAGNOSIS — D631 Anemia in chronic kidney disease: Secondary | ICD-10-CM | POA: Diagnosis not present

## 2015-12-03 DIAGNOSIS — N2581 Secondary hyperparathyroidism of renal origin: Secondary | ICD-10-CM | POA: Diagnosis not present

## 2015-12-06 DIAGNOSIS — N186 End stage renal disease: Secondary | ICD-10-CM | POA: Diagnosis not present

## 2015-12-06 DIAGNOSIS — D631 Anemia in chronic kidney disease: Secondary | ICD-10-CM | POA: Diagnosis not present

## 2015-12-06 DIAGNOSIS — E119 Type 2 diabetes mellitus without complications: Secondary | ICD-10-CM | POA: Diagnosis not present

## 2015-12-06 DIAGNOSIS — N2581 Secondary hyperparathyroidism of renal origin: Secondary | ICD-10-CM | POA: Diagnosis not present

## 2015-12-06 DIAGNOSIS — Z992 Dependence on renal dialysis: Secondary | ICD-10-CM | POA: Diagnosis not present

## 2015-12-06 DIAGNOSIS — D509 Iron deficiency anemia, unspecified: Secondary | ICD-10-CM | POA: Diagnosis not present

## 2015-12-06 DIAGNOSIS — E1122 Type 2 diabetes mellitus with diabetic chronic kidney disease: Secondary | ICD-10-CM | POA: Diagnosis not present

## 2015-12-08 DIAGNOSIS — N186 End stage renal disease: Secondary | ICD-10-CM | POA: Diagnosis not present

## 2015-12-08 DIAGNOSIS — E119 Type 2 diabetes mellitus without complications: Secondary | ICD-10-CM | POA: Diagnosis not present

## 2015-12-08 DIAGNOSIS — N2581 Secondary hyperparathyroidism of renal origin: Secondary | ICD-10-CM | POA: Diagnosis not present

## 2015-12-08 DIAGNOSIS — D509 Iron deficiency anemia, unspecified: Secondary | ICD-10-CM | POA: Diagnosis not present

## 2015-12-08 DIAGNOSIS — D631 Anemia in chronic kidney disease: Secondary | ICD-10-CM | POA: Diagnosis not present

## 2015-12-10 DIAGNOSIS — D509 Iron deficiency anemia, unspecified: Secondary | ICD-10-CM | POA: Diagnosis not present

## 2015-12-10 DIAGNOSIS — D631 Anemia in chronic kidney disease: Secondary | ICD-10-CM | POA: Diagnosis not present

## 2015-12-10 DIAGNOSIS — E119 Type 2 diabetes mellitus without complications: Secondary | ICD-10-CM | POA: Diagnosis not present

## 2015-12-10 DIAGNOSIS — N2581 Secondary hyperparathyroidism of renal origin: Secondary | ICD-10-CM | POA: Diagnosis not present

## 2015-12-10 DIAGNOSIS — N186 End stage renal disease: Secondary | ICD-10-CM | POA: Diagnosis not present

## 2015-12-15 DIAGNOSIS — E119 Type 2 diabetes mellitus without complications: Secondary | ICD-10-CM | POA: Diagnosis not present

## 2015-12-15 DIAGNOSIS — D509 Iron deficiency anemia, unspecified: Secondary | ICD-10-CM | POA: Diagnosis not present

## 2015-12-15 DIAGNOSIS — N2581 Secondary hyperparathyroidism of renal origin: Secondary | ICD-10-CM | POA: Diagnosis not present

## 2015-12-15 DIAGNOSIS — D631 Anemia in chronic kidney disease: Secondary | ICD-10-CM | POA: Diagnosis not present

## 2015-12-15 DIAGNOSIS — N186 End stage renal disease: Secondary | ICD-10-CM | POA: Diagnosis not present

## 2015-12-16 DIAGNOSIS — Z5181 Encounter for therapeutic drug level monitoring: Secondary | ICD-10-CM | POA: Diagnosis not present

## 2015-12-17 DIAGNOSIS — N186 End stage renal disease: Secondary | ICD-10-CM | POA: Diagnosis not present

## 2015-12-17 DIAGNOSIS — D509 Iron deficiency anemia, unspecified: Secondary | ICD-10-CM | POA: Diagnosis not present

## 2015-12-17 DIAGNOSIS — N2581 Secondary hyperparathyroidism of renal origin: Secondary | ICD-10-CM | POA: Diagnosis not present

## 2015-12-17 DIAGNOSIS — D631 Anemia in chronic kidney disease: Secondary | ICD-10-CM | POA: Diagnosis not present

## 2015-12-17 DIAGNOSIS — E119 Type 2 diabetes mellitus without complications: Secondary | ICD-10-CM | POA: Diagnosis not present

## 2015-12-21 ENCOUNTER — Encounter (HOSPITAL_COMMUNITY): Payer: Self-pay | Admitting: Family Medicine

## 2015-12-21 ENCOUNTER — Emergency Department (HOSPITAL_COMMUNITY)
Admission: EM | Admit: 2015-12-21 | Discharge: 2015-12-21 | Disposition: A | Discharge status: Home / Self Care | Payer: Medicare Other | Source: Home / Self Care | Attending: Emergency Medicine | Admitting: Emergency Medicine

## 2015-12-21 ENCOUNTER — Emergency Department (HOSPITAL_COMMUNITY): Payer: Medicare Other

## 2015-12-21 DIAGNOSIS — Z86010 Personal history of colonic polyps: Secondary | ICD-10-CM | POA: Insufficient documentation

## 2015-12-21 DIAGNOSIS — R059 Cough, unspecified: Secondary | ICD-10-CM

## 2015-12-21 DIAGNOSIS — N186 End stage renal disease: Secondary | ICD-10-CM

## 2015-12-21 DIAGNOSIS — R011 Cardiac murmur, unspecified: Secondary | ICD-10-CM

## 2015-12-21 DIAGNOSIS — R112 Nausea with vomiting, unspecified: Secondary | ICD-10-CM | POA: Insufficient documentation

## 2015-12-21 DIAGNOSIS — E875 Hyperkalemia: Secondary | ICD-10-CM

## 2015-12-21 DIAGNOSIS — Z79899 Other long term (current) drug therapy: Secondary | ICD-10-CM

## 2015-12-21 DIAGNOSIS — R197 Diarrhea, unspecified: Secondary | ICD-10-CM | POA: Insufficient documentation

## 2015-12-21 DIAGNOSIS — E1129 Type 2 diabetes mellitus with other diabetic kidney complication: Secondary | ICD-10-CM | POA: Diagnosis not present

## 2015-12-21 DIAGNOSIS — Z9889 Other specified postprocedural states: Secondary | ICD-10-CM | POA: Insufficient documentation

## 2015-12-21 DIAGNOSIS — R51 Headache: Secondary | ICD-10-CM

## 2015-12-21 DIAGNOSIS — Z87891 Personal history of nicotine dependence: Secondary | ICD-10-CM

## 2015-12-21 DIAGNOSIS — I25119 Atherosclerotic heart disease of native coronary artery with unspecified angina pectoris: Secondary | ICD-10-CM | POA: Insufficient documentation

## 2015-12-21 DIAGNOSIS — R05 Cough: Secondary | ICD-10-CM

## 2015-12-21 DIAGNOSIS — Z7982 Long term (current) use of aspirin: Secondary | ICD-10-CM

## 2015-12-21 DIAGNOSIS — D649 Anemia, unspecified: Secondary | ICD-10-CM | POA: Insufficient documentation

## 2015-12-21 DIAGNOSIS — R509 Fever, unspecified: Secondary | ICD-10-CM | POA: Insufficient documentation

## 2015-12-21 DIAGNOSIS — Z7902 Long term (current) use of antithrombotics/antiplatelets: Secondary | ICD-10-CM

## 2015-12-21 DIAGNOSIS — R0602 Shortness of breath: Secondary | ICD-10-CM | POA: Insufficient documentation

## 2015-12-21 DIAGNOSIS — Z8611 Personal history of tuberculosis: Secondary | ICD-10-CM | POA: Insufficient documentation

## 2015-12-21 DIAGNOSIS — Z9981 Dependence on supplemental oxygen: Secondary | ICD-10-CM | POA: Insufficient documentation

## 2015-12-21 DIAGNOSIS — R079 Chest pain, unspecified: Secondary | ICD-10-CM

## 2015-12-21 DIAGNOSIS — Z992 Dependence on renal dialysis: Secondary | ICD-10-CM

## 2015-12-21 DIAGNOSIS — R531 Weakness: Secondary | ICD-10-CM

## 2015-12-21 DIAGNOSIS — Z9861 Coronary angioplasty status: Secondary | ICD-10-CM | POA: Insufficient documentation

## 2015-12-21 DIAGNOSIS — R404 Transient alteration of awareness: Secondary | ICD-10-CM | POA: Diagnosis not present

## 2015-12-21 DIAGNOSIS — I120 Hypertensive chronic kidney disease with stage 5 chronic kidney disease or end stage renal disease: Secondary | ICD-10-CM

## 2015-12-21 DIAGNOSIS — R0981 Nasal congestion: Secondary | ICD-10-CM

## 2015-12-21 LAB — INFLUENZA PANEL BY PCR (TYPE A & B)
H1N1 flu by pcr: NOT DETECTED
INFLBPCR: NEGATIVE
Influenza A By PCR: NEGATIVE

## 2015-12-21 LAB — CBC WITH DIFFERENTIAL/PLATELET
Basophils Absolute: 0 10*3/uL (ref 0.0–0.1)
Basophils Relative: 0 %
EOS ABS: 0.2 10*3/uL (ref 0.0–0.7)
Eosinophils Relative: 2 %
HCT: 32.2 % — ABNORMAL LOW (ref 39.0–52.0)
HEMOGLOBIN: 9.8 g/dL — AB (ref 13.0–17.0)
LYMPHS ABS: 0.9 10*3/uL (ref 0.7–4.0)
LYMPHS PCT: 8 %
MCH: 24.9 pg — AB (ref 26.0–34.0)
MCHC: 30.4 g/dL (ref 30.0–36.0)
MCV: 81.7 fL (ref 78.0–100.0)
Monocytes Absolute: 1.1 10*3/uL — ABNORMAL HIGH (ref 0.1–1.0)
Monocytes Relative: 11 %
NEUTROS ABS: 8.1 10*3/uL — AB (ref 1.7–7.7)
NEUTROS PCT: 79 %
Platelets: 252 10*3/uL (ref 150–400)
RBC: 3.94 MIL/uL — AB (ref 4.22–5.81)
RDW: 18.1 % — ABNORMAL HIGH (ref 11.5–15.5)
WBC: 10.2 10*3/uL (ref 4.0–10.5)

## 2015-12-21 LAB — COMPREHENSIVE METABOLIC PANEL
ALK PHOS: 83 U/L (ref 38–126)
ALT: 18 U/L (ref 17–63)
AST: 15 U/L (ref 15–41)
Albumin: 3.5 g/dL (ref 3.5–5.0)
Anion gap: 21 — ABNORMAL HIGH (ref 5–15)
BUN: 69 mg/dL — ABNORMAL HIGH (ref 6–20)
CALCIUM: 8.6 mg/dL — AB (ref 8.9–10.3)
CO2: 19 mmol/L — AB (ref 22–32)
CREATININE: 13.59 mg/dL — AB (ref 0.61–1.24)
Chloride: 99 mmol/L — ABNORMAL LOW (ref 101–111)
GFR calc non Af Amer: 3 mL/min — ABNORMAL LOW (ref >60)
GFR, EST AFRICAN AMERICAN: 4 mL/min — AB (ref >60)
Glucose, Bld: 99 mg/dL (ref 65–99)
Potassium: 7.1 mmol/L (ref 3.5–5.1)
SODIUM: 139 mmol/L (ref 135–145)
Total Bilirubin: 0.8 mg/dL (ref 0.3–1.2)
Total Protein: 7.5 g/dL (ref 6.5–8.1)

## 2015-12-21 LAB — BASIC METABOLIC PANEL WITH GFR
Anion gap: 20 — ABNORMAL HIGH (ref 5–15)
BUN: 68 mg/dL — ABNORMAL HIGH (ref 6–20)
CO2: 19 mmol/L — ABNORMAL LOW (ref 22–32)
Calcium: 8.3 mg/dL — ABNORMAL LOW (ref 8.9–10.3)
Chloride: 101 mmol/L (ref 101–111)
Creatinine, Ser: 13.03 mg/dL — ABNORMAL HIGH (ref 0.61–1.24)
GFR calc Af Amer: 4 mL/min — ABNORMAL LOW
GFR calc non Af Amer: 4 mL/min — ABNORMAL LOW
Glucose, Bld: 88 mg/dL (ref 65–99)
Potassium: 4.9 mmol/L (ref 3.5–5.1)
Sodium: 140 mmol/L (ref 135–145)

## 2015-12-21 LAB — I-STAT TROPONIN, ED: Troponin i, poc: 0.03 ng/mL (ref 0.00–0.08)

## 2015-12-21 LAB — I-STAT CG4 LACTIC ACID, ED: LACTIC ACID, VENOUS: 1.69 mmol/L (ref 0.5–2.0)

## 2015-12-21 LAB — BRAIN NATRIURETIC PEPTIDE: B NATRIURETIC PEPTIDE 5: 1252.2 pg/mL — AB (ref 0.0–100.0)

## 2015-12-21 LAB — CBG MONITORING, ED: Glucose-Capillary: 93 mg/dL (ref 65–99)

## 2015-12-21 LAB — MAGNESIUM: Magnesium: 2.2 mg/dL (ref 1.7–2.4)

## 2015-12-21 LAB — TROPONIN I: Troponin I: 0.04 ng/mL — ABNORMAL HIGH (ref <0.031)

## 2015-12-21 MED ORDER — ACETAMINOPHEN 325 MG PO TABS
650.0000 mg | ORAL_TABLET | Freq: Once | ORAL | Status: AC
  Administered 2015-12-21: 650 mg via ORAL
  Filled 2015-12-21: qty 2

## 2015-12-21 MED ORDER — SODIUM CHLORIDE 0.9 % IV SOLN
1.0000 g | Freq: Once | INTRAVENOUS | Status: DC
  Filled 2015-12-21: qty 10

## 2015-12-21 MED ORDER — SODIUM CHLORIDE 0.9 % IV SOLN
1.0000 g | Freq: Once | INTRAVENOUS | Status: AC
  Administered 2015-12-21: 1 g via INTRAVENOUS

## 2015-12-21 MED ORDER — INSULIN ASPART 100 UNIT/ML IV SOLN
10.0000 [IU] | Freq: Once | INTRAVENOUS | Status: AC
  Administered 2015-12-21: 10 [IU] via INTRAVENOUS
  Filled 2015-12-21: qty 1

## 2015-12-21 MED ORDER — DEXTROSE 50 % IV SOLN
1.0000 | Freq: Once | INTRAVENOUS | Status: AC
  Administered 2015-12-21: 50 mL via INTRAVENOUS
  Filled 2015-12-21: qty 50

## 2015-12-21 MED ORDER — ALBUTEROL SULFATE (2.5 MG/3ML) 0.083% IN NEBU
10.0000 mg | INHALATION_SOLUTION | Freq: Once | RESPIRATORY_TRACT | Status: AC
  Administered 2015-12-21: 10 mg via RESPIRATORY_TRACT
  Filled 2015-12-21: qty 12

## 2015-12-21 MED ORDER — CALCIUM GLUCONATE 10 % IV SOLN
1.0000 g | Freq: Once | INTRAVENOUS | Status: DC
  Filled 2015-12-21: qty 10

## 2015-12-21 NOTE — Consult Note (Signed)
[name] [name] is a 60 y.o. male, with a history of ESRD on hemodialysis TTS at [facility], CHF, DM, hypertension, and CAD. He missed his HD treatment Tuesday due to illness. He presented to the ED this morning with persistent cough, increased shortness of breath,  accompanied by cough induced mid chest pain, headache, weakness, fever, nausea, and vomiting for the last 3 days. Cough is productive with green sputum. Patient states that Tmax was 101 yesterday.  Patient's headache is bilateral frontal, throbbing, nonradiating.  CXR in the ED revealed interstitial edema and serum potassium was 7.1.  EKG revealed peaked T waves.  OP HD EDW 89.5kg, 2k,2Ca, Hectorol 74mg, Hep 8000 units.  Past Medical History  Diagnosis Date  . Hypertension   . Hematochezia     a. 2014: colonscopy, which showed moderately-sized internal hemorrhoids, two 532mpolyps in transverse colon and ascending colon that were resected, five 2-4m71molyps in sigmoid colon, descending colon, transverse colon, and ascending colon that were resected. An upper endoscopy was performed and showed normal esophagus, stomach, and duodenum.  . Hematuria     a. H/o hematuria 2014 with cystoscopy that was unrevealing for his source of hematuria. He underwent a kidney ultrasound on 10/14 that showed mildly echogenic and scarred kidneys compatible with medical renal disease, without hydronephrosis or renal calculi.  . AMarland Kitchenemia   . CAD (coronary artery disease)     a. per CareEverywhere s/p 3.5mm68m15mm55mion BMS to mid LAD 12/2009 and Xience DES to mid LAD 10/2010.  . Colon polyps   . Chronic diastolic CHF (congestive heart failure) (HCC) [place] Hyperlipidemia   . Anginal pain (HCC) [place] Heart murmur   . 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 Type II diabetes mellitus (HCC) [place] History of blood transfusion     "had colonoscopy done; they had to give me some blood"  . Daily headache   . [facility]    [place] (07[place]/2015[place]  . On home oxygen therapy     "2L prn" (07/21/2015)  . Renal insufficiency    Past Surgical History  Procedure Laterality Date  . Left heart catheterization with coronary angiogram N/A 11/23/2014    Procedure: LEFT HEART CATHETERIZATION WITH CORONARY ANGIOGRAM;  Surgeon: ThomaTroy Sine  [facility];  Service: Cardiovascular;  Laterality: N/A;  . Lithotripsy  X1  . Cystoscopy w/ stone manipulation  X2?  . Cardiac catheterization  "several"  . Coronary angioplasty with stent placement  "several"  . Eye surgery Bilateral     "laser OR for hemorrhage"  . Av fistula placement Left ~ 2007    "upper arm"   Social History:  reports that he quit smoking about 5 years ago. His smoking use included Cigarettes. He has a 4 pack-year smoking history. He has never used smokeless tobacco. He reports that he does not drink alcohol or use illicit drugs. Allergies:  Allergies  Allergen Reactions  . Enalapril Hives   Family History  Problem Relation Age of Onset  . Hypertension    . Bone cancer Mother   . Anuerysm Father   . Diabetes type II Daughter     Medications: Per list   ROS: as per HPI  Blood pressure 163/78, pulse 95, temperature 98.4 F (36.9 C), temperature source Oral, resp. rate 25, weight 94.9 kg (209 lb 3.5  oz), SpO2 95 %.  General appearance: alert and cooperative Head: Normocephalic, without obvious abnormality, atraumatic Eyes: negative Ears: normal TM's and external ear canals both ears Nose: Nares normal. Septum midline. Mucosa normal. No drainage or sinus tenderness. Throat: lips, mucosa, and tongue normal; teeth and gums normal Resp: clear to auscultation bilaterally Chest wall: no tenderness Cardio: regular rate and rhythm, S1, S2 normal, no murmur, click, rub or gallop GI: soft, non-tender; bowel sounds normal; no masses,  no organomegaly Extremities: extremities normal, atraumatic, no cyanosis or  edema Skin: multiple tatoos Neurologic: NF Results for orders placed or performed during the hospital encounter of 12/21/15 (from the past 48 hour(s))  CBC with Differential     Status: Abnormal   Collection Time: 12/21/15 11:07 AM  Result Value Ref Range   WBC 10.2 4.0 - 10.5 K/uL   RBC 3.94 (L) 4.22 - 5.81 MIL/uL   Hemoglobin 9.8 (L) 13.0 - 17.0 g/dL   HCT 32.2 (L) 39.0 - 52.0 %   MCV 81.7 78.0 - 100.0 fL   MCH 24.9 (L) 26.0 - 34.0 pg   MCHC 30.4 30.0 - 36.0 g/dL   RDW 18.1 (H) 11.5 - 15.5 %   Platelets 252 150 - 400 K/uL   Neutrophils Relative % 79 %   Neutro Abs 8.1 (H) 1.7 - 7.7 K/uL   Lymphocytes Relative 8 %   Lymphs Abs 0.9 0.7 - 4.0 K/uL   Monocytes Relative 11 %   Monocytes Absolute 1.1 (H) 0.1 - 1.0 K/uL   Eosinophils Relative 2 %   Eosinophils Absolute 0.2 0.0 - 0.7 K/uL   Basophils Relative 0 %   Basophils Absolute 0.0 0.0 - 0.1 K/uL  Comprehensive metabolic panel     Status: Abnormal   Collection Time: 12/21/15 11:07 AM  Result Value Ref Range   Sodium 139 135 - 145 mmol/L   Potassium 7.1 (HH) 3.5 - 5.1 mmol/L    Comment: NO VISIBLE HEMOLYSIS CRITICAL RESULT CALLED TO, READ BACK BY AND VERIFIED WITH: H.HALL,RN 1156 12/21/15 CLARK,S    Chloride 99 (L) 101 - 111 mmol/L   CO2 19 (L) 22 - 32 mmol/L   Glucose, Bld 99 65 - 99 mg/dL   BUN 69 (H) 6 - 20 mg/dL   Creatinine, Ser 13.59 (H) 0.61 - 1.24 mg/dL   Calcium 8.6 (L) 8.9 - 10.3 mg/dL   Total Protein 7.5 6.5 - 8.1 g/dL   Albumin 3.5 3.5 - 5.0 g/dL   AST 15 15 - 41 U/L   ALT 18 17 - 63 U/L   Alkaline Phosphatase 83 38 - 126 U/L   Total Bilirubin 0.8 0.3 - 1.2 mg/dL   GFR calc non Af Amer 3 (L) >60 mL/min   GFR calc Af Amer 4 (L) >60 mL/min    Comment: (NOTE) The eGFR has been calculated using the CKD EPI equation. This calculation has not been validated in all clinical situations. eGFR's persistently <60 mL/min signify possible Chronic Kidney Disease.    Anion gap 21 (H) 5 - 15  Magnesium     Status:  None   Collection Time: 12/21/15 11:07 AM  Result Value Ref Range   Magnesium 2.2 1.7 - 2.4 mg/dL  Brain natriuretic peptide     Status: Abnormal   Collection Time: 12/21/15 11:07 AM  Result Value Ref Range   B Natriuretic Peptide 1252.2 (H) 0.0 - 100.0 pg/mL  Troponin I     Status: Abnormal   Collection Time: 12/21/15 11:07  AM  Result Value Ref Range   Troponin I 0.04 (H) <0.031 ng/mL    Comment:        PERSISTENTLY INCREASED TROPONIN VALUES IN THE RANGE OF 0.04-0.49 ng/mL CAN BE SEEN IN:       -UNSTABLE ANGINA       -CONGESTIVE HEART FAILURE       -MYOCARDITIS       -CHEST TRAUMA       -ARRYHTHMIAS       -LATE PRESENTING MYOCARDIAL INFARCTION       -COPD   CLINICAL FOLLOW-UP RECOMMENDED.   I-stat troponin, ED     Status: None   Collection Time: 12/21/15 11:25 AM  Result Value Ref Range   Troponin i, poc 0.03 0.00 - 0.08 ng/mL   Comment 3            Comment: Due to the release kinetics of cTnI, a negative result within the first hours of the onset of symptoms does not rule out myocardial infarction with certainty. If myocardial infarction is still suspected, repeat the test at appropriate intervals.   I-Stat CG4 Lactic Acid, ED     Status: None   Collection Time: 12/21/15 11:28 AM  Result Value Ref Range   Lactic Acid, Venous 1.69 0.5 - 2.0 mmol/L  CBG monitoring, ED     Status: None   Collection Time: 12/21/15 12:06 PM  Result Value Ref Range   Glucose-Capillary 93 65 - 99 mg/dL  Basic metabolic panel     Status: Abnormal   Collection Time: 12/21/15  2:26 PM  Result Value Ref Range   Sodium 140 135 - 145 mmol/L   Potassium 4.9 3.5 - 5.1 mmol/L    Comment: DELTA CHECK NOTED   Chloride 101 101 - 111 mmol/L   CO2 19 (L) 22 - 32 mmol/L   Glucose, Bld 88 65 - 99 mg/dL   BUN 68 (H) 6 - 20 mg/dL   Creatinine, Ser 13.03 (H) 0.61 - 1.24 mg/dL   Calcium 8.3 (L) 8.9 - 10.3 mg/dL   GFR calc non Af Amer 4 (L) >60 mL/min   GFR calc Af Amer 4 (L) >60 mL/min    Comment:  (NOTE) The eGFR has been calculated using the CKD EPI equation. This calculation has not been validated in all clinical situations. eGFR's persistently <60 mL/min signify possible Chronic Kidney Disease.    Anion gap 20 (H) 5 - 15   Dg Chest 2 View  12/21/2015  CLINICAL DATA:  Cough for 5 days EXAM: CHEST  2 VIEW COMPARISON:  October 28, 2015 FINDINGS: Mild generalized interstitial edema remains. There is no airspace consolidation. Heart is mildly enlarged with pulmonary venous hypertension. No adenopathy. There is a coronary stent in the left anterior descending coronary artery. No bone lesions evident. IMPRESSION: Evidence a degree of congestive heart failure. No airspace consolidation. Electronically Signed   By: Lowella Grip III M.D.   On: 12/21/2015 10:44    Assessment: 1 Viral illness 2 ESRD 3 Hyperkalemia with EKG changes 4 Pulmonary edema  Plan: 1 Urgent HD using a low potassium bath  Almon Whitford C 12/21/2015, 4:21 PM

## 2015-12-21 NOTE — ED Notes (Signed)
CRITICAL VALUE ALERT  Critical value received:  K 7.1 Date of notification:  12/21/15  Time of notification:  1156  Critical value read back: YES Nurse who received alert:  Earleen Newport RN   MD notified: MD Johnney Killian

## 2015-12-21 NOTE — Progress Notes (Signed)
The hospitalist service and IM apparently not committing to admit pt. Pt clinially stable for discharge after dialysis and advised to return to ED if needed.   Pt will be discharged.  I was asked to write this note. [name] [name]

## 2015-12-21 NOTE — Procedures (Signed)
Tolerating HD.  Urgent treatment for hyperkalemia.  No hemodynamic issues.  [name] [name] C

## 2015-12-21 NOTE — ED Notes (Signed)
Pt here for cough, congestion and weakness. sts missed dialysis yesterday due to sickness. sts some N,V.

## 2015-12-21 NOTE — Progress Notes (Signed)
Mr. Puma completed hemodialysis treatment and had 3.5 liters of fluids removed. Pt. verbalized feeling better, but still has occasional non-productive cough. Denies feeling dizzy or light-headed when standing up. Post HD weight: 90.5 kg. Assisted by SW for cab fare. Assisted by Coy Saunas RN to get to a cab. Dr. Florene Glen aware of this discharge.

## 2015-12-21 NOTE — ED Provider Notes (Signed)
CSN: [id number]     Arrival date & time 12/21/15  [id number] History   First MD Initiated Contact with Patient 12/21/15 1025     Chief Complaint  Patient presents with  . Cough  . Nasal Congestion     (Consider location/radiation/quality/duration/timing/severity/associated sxs/prior Treatment) HPI   [name] [name] is a 60 y.o. male, with a history of CKD on dialysis, CHF, DM, hypertension, and CAD, presenting to the ED with cough and shortness of breath accompanied by chest pain, headache, weakness, fever, nausea, and vomiting for the last 3 days. Cough is productive with green sputum. Patient states that MAXIMUM TEMPERATURE was 101 yesterday. Patient is currently pain-free in the chest, but states that when he coughs his pain rates at 6 out of 10, described as a soreness, nonradiating. Patient's headache is bilateral frontal, rated 7 out of 10, throbbing, nonradiating. Patient was supposed to go to dialysis yesterday, but states he was too sick. Patient is supposed to receive dialysis on Tuesday, Thursday, and Saturday. Patient denies taking any medications for his symptoms. Patient further denies any other complaints.    Past Medical History  Diagnosis Date  . Hypertension   . Hematochezia     a. 2014: colonscopy, which showed moderately-sized internal hemorrhoids, two 68mm polyps in transverse colon and ascending colon that were resected, five 2-97mm polyps in sigmoid colon, descending colon, transverse colon, and ascending colon that were resected. An upper endoscopy was performed and showed normal esophagus, stomach, and duodenum.  . Hematuria     a. H/o hematuria 2014 with cystoscopy that was unrevealing for his source of hematuria. He underwent a kidney ultrasound on 10/14 that showed mildly echogenic and scarred kidneys compatible with medical renal disease, without hydronephrosis or renal calculi.  [name] Anemia   . CAD (coronary artery disease)     a. per CareEverywhere s/p 3.42mm x 90mm [place]  BMS to mid LAD 12/2009 and Xience DES to mid LAD 10/2010.  . Colon polyps   . Chronic diastolic CHF (congestive heart failure) ([place])   . Hyperlipidemia   . Anginal pain ([place])   . Heart murmur   . 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 Type II diabetes mellitus ([place])   . History of blood transfusion     "had colonoscopy done; they had to give me some blood"  . Daily headache   . ESRD on dialysis [facility]) since ~ 2008    "[place]; TTS" (07/21/2015)  . On home oxygen therapy     "2L prn" (07/21/2015)  . Renal insufficiency    Past Surgical History  Procedure Laterality Date  . Left heart catheterization with coronary angiogram N/A 11/23/2014    Procedure: LEFT HEART CATHETERIZATION WITH CORONARY ANGIOGRAM;  Surgeon: Troy Sine, MD;  Location: [facility] CATH LAB;  Service: Cardiovascular;  Laterality: N/A;  . Lithotripsy  X1  . Cystoscopy w/ stone manipulation  X2?  . Cardiac catheterization  "several"  . Coronary angioplasty with stent placement  "several"  . Eye surgery Bilateral     "laser OR for hemorrhage"  . Av fistula placement Left ~ 2007    "upper arm"   Family History  Problem Relation Age of Onset  . Hypertension    . Bone cancer Mother   . Anuerysm Father   . Diabetes type II Daughter    Social History  Substance Use Topics  . Smoking status: Former Smoker -- 0.50 packs/day for  8 years    Types: Cigarettes    Quit date: 12/06/2010  . Smokeless tobacco: Never Used  . Alcohol Use: No    Review of Systems  Constitutional: Positive for fever (Reported). Negative for chills.  Respiratory: Positive for cough and shortness of breath.   Cardiovascular: Positive for chest pain. Negative for leg swelling.  Gastrointestinal: Positive for nausea, vomiting and diarrhea. Negative for blood in stool.  Genitourinary: Negative for dysuria and hematuria.       Patient still makes some urine.  Neurological: Positive for weakness and headaches. Negative  for dizziness, light-headedness and numbness.  All other systems reviewed and are negative.     Allergies  Enalapril  Home Medications   Prior to Admission medications   Medication Sig Start Date End Date Taking? Authorizing Provider  acetaminophen (TYLENOL) 500 MG tablet Take 500 mg by mouth every 6 (six) hours as needed for headache.     Historical Provider, MD  amitriptyline (ELAVIL) 100 MG tablet Take 1 tablet (100 mg total) by mouth at bedtime. 07/27/15   Shela Leff, MD  amLODipine (NORVASC) 10 MG tablet Take 1 tablet (10 mg total) by mouth daily. 10/30/15   Ripudeep Krystal Eaton, MD  aspirin 81 MG chewable tablet Chew 1 tablet (81 mg total) by mouth daily. 07/22/15   Iline Oven, MD  atorvastatin (LIPITOR) 20 MG tablet Take 20 mg by mouth at bedtime.     Historical Provider, MD  carvedilol (COREG) 25 MG tablet Take 2 tablets (50 mg total) by mouth 2 (two) times daily. 10/30/15   Ripudeep Krystal Eaton, MD  cinacalcet (SENSIPAR) 90 MG tablet Take 90 mg by mouth at bedtime.     Historical Provider, MD  clopidogrel (PLAVIX) 75 MG tablet Take 1 tablet (75 mg total) by mouth daily. 11/24/14   Juluis Mire, MD  Darbepoetin Alfa (ARANESP) 25 MCG/0.42ML SOSY injection Inject 0.42 mLs (25 mcg total) into the vein every Tuesday with hemodialysis. 12/15/14   Geradine Girt, DO  doxercalciferol (HECTOROL) 4 MCG/2ML injection Inject 1 mL (2 mcg total) into the vein Every Tuesday,Thursday,and Saturday with dialysis. 12/15/14   Geradine Girt, DO  ferric gluconate 62.5 mg in sodium chloride 0.9 % 100 mL Inject 62.5 mg into the vein every Thursday with hemodialysis. 12/16/14   Geradine Girt, DO  isosorbide mononitrate (IMDUR) 30 MG 24 hr tablet Take 1 tablet (30 mg total) by mouth daily. 10/30/15   Ripudeep Krystal Eaton, MD  losartan (COZAAR) 100 MG tablet Take 1 tablet (100 mg total) by mouth at bedtime. 10/30/15   Ripudeep Krystal Eaton, MD  meclizine (ANTIVERT) 25 MG tablet Take 25 mg by mouth 3 (three) times daily as  needed for dizziness.    Historical Provider, MD  metoCLOPramide (REGLAN) 5 MG tablet Take 1 tablet (5 mg total) by mouth every 8 (eight) hours as needed for nausea or vomiting. 10/31/15   Ripudeep Krystal Eaton, MD  multivitamin (RENA-VIT) TABS tablet Take 1 tablet by mouth at bedtime. 12/15/14   Geradine Girt, DO  nitroGLYCERIN (NITROSTAT) 0.4 MG SL tablet Place 1 tablet (0.4 mg total) under the tongue every 5 (five) minutes as needed for chest pain. 06/05/15   Janece Canterbury, MD  pantoprazole (PROTONIX) 40 MG tablet Take 1 tablet (40 mg total) by mouth daily. Patient taking differently: Take 40 mg by mouth daily as needed (for acid reflux).  06/05/15   Janece Canterbury, MD  pregabalin (LYRICA) 50 MG capsule Take 1  capsule (50 mg total) by mouth daily. 12/15/14   Geradine Girt, DO  sevelamer carbonate (RENVELA) 800 MG tablet Take 2,400 mg by mouth 3 (three) times daily with meals.     Historical Provider, MD  simethicone (GAS-X) 80 MG chewable tablet Chew 1 tablet (80 mg total) by mouth every 6 (six) hours as needed for flatulence. 08/29/15   Collier Salina, MD  UNABLE TO FIND Outpatient physical therapy  Diagnosis: generalized debility, ESRD on hemodialysis 10/30/15   Ripudeep Krystal Eaton, MD  venlafaxine XR (EFFEXOR XR) 75 MG 24 hr capsule Take 1 capsule (75 mg total) by mouth daily with breakfast. 07/27/15   Shela Leff, MD   BP 153/85 mmHg  Pulse 104  Temp(Src) 98.4 F (36.9 C) (Oral)  Resp 25  Wt 94.9 kg  SpO2 94% Physical Exam  Constitutional: He is oriented to person, place, and time. He appears well-developed and well-nourished. No distress.  HENT:  Head: Normocephalic and atraumatic.  Eyes: Conjunctivae and EOM are normal. Pupils are equal, round, and reactive to light.  Neck: Normal range of motion. Neck supple.  Cardiovascular: Normal rate, regular rhythm, normal heart sounds and intact distal pulses.   Pulmonary/Chest: Tachypnea (Minor) noted. He has rales in the right middle field,  the right lower field, the left middle field and the left lower field.  Increased work of breathing, but patient still able to speak in full sentences. Tenderness on palpation to the anterior chest.  Abdominal: Soft. Bowel sounds are normal. There is no tenderness. There is no guarding.  Musculoskeletal: He exhibits no edema or tenderness.  Lymphadenopathy:    He has no cervical adenopathy.  Neurological: He is alert and oriented to person, place, and time. He has normal reflexes.  No sensory deficits. Strength 5/5 in all extremities. Coordination intact. Cranial nerves III-XII grossly intact. No facial droop.   Skin: Skin is warm and dry. He is not diaphoretic.  Psychiatric: He has a normal mood and affect. His behavior is normal.  Nursing note and vitals reviewed.   ED Course  Procedures (including critical care time)  CRITICAL CARE Performed by: Shawn C Joy  ?  Total critical care time: 35 minutes  Critical care time was exclusive of separately billable procedures and treating other patients.  Critical care was necessary to treat or prevent imminent or life-threatening deterioration.  Critical care was time spent personally by me on the following activities: development of treatment plan with patient and/or surrogate as well as nursing, discussions with consultants, evaluation of patient's response to treatment, examination of patient, obtaining history from patient or surrogate, ordering and performing treatments and interventions, ordering and review of laboratory studies, ordering and review of radiographic studies, pulse oximetry and re-evaluation of patient's condition.  Labs Review Labs Reviewed  CBC WITH DIFFERENTIAL/PLATELET - Abnormal; Notable for the following:    RBC 3.94 (*)    Hemoglobin 9.8 (*)    HCT 32.2 (*)    MCH 24.9 (*)    RDW 18.1 (*)    Neutro Abs 8.1 (*)    Monocytes Absolute 1.1 (*)    All other components within normal limits  COMPREHENSIVE  METABOLIC PANEL - Abnormal; Notable for the following:    Potassium 7.1 (*)    Chloride 99 (*)    CO2 19 (*)    BUN 69 (*)    Creatinine, Ser 13.59 (*)    Calcium 8.6 (*)    GFR calc non Af Amer 3 (*)  GFR calc Af Amer 4 (*)    Anion gap 21 (*)    All other components within normal limits  BRAIN NATRIURETIC PEPTIDE - Abnormal; Notable for the following:    B Natriuretic Peptide 1252.2 (*)    All other components within normal limits  TROPONIN I - Abnormal; Notable for the following:    Troponin I 0.04 (*)    All other components within normal limits  CULTURE, BLOOD (ROUTINE X 2)  CULTURE, BLOOD (ROUTINE X 2)  URINE CULTURE  MAGNESIUM  INFLUENZA PANEL BY PCR (TYPE A & B, H1N1)  URINALYSIS, ROUTINE W REFLEX MICROSCOPIC (NOT AT [facility])  BASIC METABOLIC PANEL  I-STAT TROPOININ, ED  I-STAT CG4 LACTIC ACID, ED  CBG MONITORING, ED  I-STAT CG4 LACTIC ACID, ED   BUN  Date Value Ref Range Status  12/21/2015 69* 6 - 20 mg/dL Final  10/31/2015 31* 6 - 20 mg/dL Final  10/30/2015 16 6 - 20 mg/dL Final  10/29/2015 27* 6 - 20 mg/dL Final   CREATININE, SER  Date Value Ref Range Status  12/21/2015 13.59* 0.61 - 1.24 mg/dL Final  10/31/2015 8.89* 0.61 - 1.24 mg/dL Final  10/30/2015 6.59* 0.61 - 1.24 mg/dL Final  10/29/2015 9.92* 0.61 - 1.24 mg/dL Final       Imaging Review Dg Chest 2 View  12/21/2015  CLINICAL DATA:  Cough for 5 days EXAM: CHEST  2 VIEW COMPARISON:  October 28, 2015 FINDINGS: Mild generalized interstitial edema remains. There is no airspace consolidation. Heart is mildly enlarged with pulmonary venous hypertension. No adenopathy. There is a coronary stent in the left anterior descending coronary artery. No bone lesions evident. IMPRESSION: Evidence a degree of congestive heart failure. No airspace consolidation. Electronically Signed   By: Lowella Grip III M.D.   On: 12/21/2015 10:44   I have personally reviewed and evaluated these images and lab results as part  of my medical decision-making.   EKG Interpretation   Date/Time:  Wednesday December 21 2015 11:21:04 EDT Ventricular Rate:  94 PR Interval:  139 QRS Duration: 108 QT Interval:  377 QTC Calculation: 471 R Axis:   86 Text Interpretation:  Sinus rhythm Borderline low voltage, extremity leads  T wave peaking, not significant change from previous. Confirmed by  Johnney Killian, MD, Jeannie Done [id number]) on 12/21/2015 11:28:29 AM      MDM   Final diagnoses:  Cough  Shortness of breath  Fever, unspecified fever cause  Non-intractable vomiting with nausea, vomiting of unspecified type  Hyperkalemia    [name] presents with shortness of breath, cough, and other flulike symptoms for the past 3 days.  Findings and plan of care discussed with Charlesetta Shanks, MD. Dr. Johnney Killian personally evaluated and examined this patient.  This patient's presentation is suggestive of a viral illness, such as influenza, complicated by the patient's missed dialysis, possibly causing fluid overload. Patient does not show signs of severe respiratory distress and he maintains adequate SPO2 on room air. Pt was found to be hyperkalemic with EKG changes. Hyperkalemia order set initiated.  12:07 PM Spoke with Dr. Florene Glen, Nephrologist, who agreed to come see the patient and set him up for dialysis.  Upon reassessment, patient voices mild improvement following the albuterol treatment. Patient does not add any additional complaints. Patient was taken upstairs for dialysis. Patient will be reassessed following dialysis and a determination for admission versus discharge will be made at that time.   Filed Vitals:   12/21/15 1230 12/21/15 1245 12/21/15 1300 12/21/15  1315  BP: 157/92 150/84 161/89   Pulse:  101 101 102  Temp:      TempSrc:      Resp:   29 19  SpO2: 100% 99% 96% 94%    Lorayne Bender, PA-C 12/21/15 [place], MD 12/21/15 1541

## 2015-12-23 ENCOUNTER — Observation Stay (HOSPITAL_COMMUNITY): Payer: Medicare Other

## 2015-12-23 ENCOUNTER — Inpatient Hospital Stay (HOSPITAL_COMMUNITY)
Admission: EM | Admit: 2015-12-23 | Discharge: 2015-12-25 | DRG: 313 | Disposition: A | Discharge status: Home / Self Care | Payer: Medicare Other | Attending: Internal Medicine | Admitting: Internal Medicine

## 2015-12-23 ENCOUNTER — Encounter (HOSPITAL_COMMUNITY): Payer: Self-pay | Admitting: Emergency Medicine

## 2015-12-23 ENCOUNTER — Emergency Department (HOSPITAL_COMMUNITY): Payer: Medicare Other

## 2015-12-23 DIAGNOSIS — N186 End stage renal disease: Secondary | ICD-10-CM | POA: Diagnosis present

## 2015-12-23 DIAGNOSIS — I5042 Chronic combined systolic (congestive) and diastolic (congestive) heart failure: Secondary | ICD-10-CM | POA: Diagnosis present

## 2015-12-23 DIAGNOSIS — I132 Hypertensive heart and chronic kidney disease with heart failure and with stage 5 chronic kidney disease, or end stage renal disease: Secondary | ICD-10-CM | POA: Diagnosis present

## 2015-12-23 DIAGNOSIS — E1129 Type 2 diabetes mellitus with other diabetic kidney complication: Secondary | ICD-10-CM | POA: Diagnosis not present

## 2015-12-23 DIAGNOSIS — R0789 Other chest pain: Secondary | ICD-10-CM | POA: Diagnosis not present

## 2015-12-23 DIAGNOSIS — E1122 Type 2 diabetes mellitus with diabetic chronic kidney disease: Secondary | ICD-10-CM | POA: Diagnosis present

## 2015-12-23 DIAGNOSIS — I120 Hypertensive chronic kidney disease with stage 5 chronic kidney disease or end stage renal disease: Secondary | ICD-10-CM | POA: Diagnosis not present

## 2015-12-23 DIAGNOSIS — I2510 Atherosclerotic heart disease of native coronary artery without angina pectoris: Secondary | ICD-10-CM | POA: Diagnosis present

## 2015-12-23 DIAGNOSIS — Z87891 Personal history of nicotine dependence: Secondary | ICD-10-CM

## 2015-12-23 DIAGNOSIS — R531 Weakness: Secondary | ICD-10-CM | POA: Diagnosis not present

## 2015-12-23 DIAGNOSIS — E785 Hyperlipidemia, unspecified: Secondary | ICD-10-CM | POA: Diagnosis present

## 2015-12-23 DIAGNOSIS — E1142 Type 2 diabetes mellitus with diabetic polyneuropathy: Secondary | ICD-10-CM | POA: Diagnosis present

## 2015-12-23 DIAGNOSIS — Z7902 Long term (current) use of antithrombotics/antiplatelets: Secondary | ICD-10-CM

## 2015-12-23 DIAGNOSIS — R109 Unspecified abdominal pain: Secondary | ICD-10-CM | POA: Diagnosis not present

## 2015-12-23 DIAGNOSIS — R404 Transient alteration of awareness: Secondary | ICD-10-CM | POA: Diagnosis not present

## 2015-12-23 DIAGNOSIS — I10 Essential (primary) hypertension: Secondary | ICD-10-CM | POA: Diagnosis present

## 2015-12-23 DIAGNOSIS — R1110 Vomiting, unspecified: Secondary | ICD-10-CM | POA: Diagnosis present

## 2015-12-23 DIAGNOSIS — K219 Gastro-esophageal reflux disease without esophagitis: Secondary | ICD-10-CM | POA: Diagnosis present

## 2015-12-23 DIAGNOSIS — R079 Chest pain, unspecified: Secondary | ICD-10-CM | POA: Diagnosis not present

## 2015-12-23 DIAGNOSIS — Z9115 Patient's noncompliance with renal dialysis: Secondary | ICD-10-CM

## 2015-12-23 DIAGNOSIS — D72829 Elevated white blood cell count, unspecified: Secondary | ICD-10-CM | POA: Diagnosis present

## 2015-12-23 DIAGNOSIS — Z8639 Personal history of other endocrine, nutritional and metabolic disease: Secondary | ICD-10-CM

## 2015-12-23 DIAGNOSIS — Z9861 Coronary angioplasty status: Secondary | ICD-10-CM

## 2015-12-23 DIAGNOSIS — Z8249 Family history of ischemic heart disease and other diseases of the circulatory system: Secondary | ICD-10-CM

## 2015-12-23 DIAGNOSIS — Z955 Presence of coronary angioplasty implant and graft: Secondary | ICD-10-CM

## 2015-12-23 DIAGNOSIS — R7989 Other specified abnormal findings of blood chemistry: Secondary | ICD-10-CM | POA: Diagnosis not present

## 2015-12-23 DIAGNOSIS — Z992 Dependence on renal dialysis: Secondary | ICD-10-CM

## 2015-12-23 DIAGNOSIS — Z7982 Long term (current) use of aspirin: Secondary | ICD-10-CM

## 2015-12-23 DIAGNOSIS — E875 Hyperkalemia: Secondary | ICD-10-CM | POA: Diagnosis not present

## 2015-12-23 DIAGNOSIS — E119 Type 2 diabetes mellitus without complications: Secondary | ICD-10-CM | POA: Diagnosis not present

## 2015-12-23 DIAGNOSIS — E1140 Type 2 diabetes mellitus with diabetic neuropathy, unspecified: Secondary | ICD-10-CM | POA: Diagnosis present

## 2015-12-23 DIAGNOSIS — R778 Other specified abnormalities of plasma proteins: Secondary | ICD-10-CM

## 2015-12-23 DIAGNOSIS — I5189 Other ill-defined heart diseases: Secondary | ICD-10-CM | POA: Diagnosis present

## 2015-12-23 LAB — URINE MICROSCOPIC-ADD ON

## 2015-12-23 LAB — BASIC METABOLIC PANEL
Anion gap: 18 — ABNORMAL HIGH (ref 5–15)
BUN: 54 mg/dL — AB (ref 6–20)
CO2: 25 mmol/L (ref 22–32)
CREATININE: 11.47 mg/dL — AB (ref 0.61–1.24)
Calcium: 8.1 mg/dL — ABNORMAL LOW (ref 8.9–10.3)
Chloride: 95 mmol/L — ABNORMAL LOW (ref 101–111)
GFR calc Af Amer: 5 mL/min — ABNORMAL LOW (ref >60)
GFR, EST NON AFRICAN AMERICAN: 4 mL/min — AB (ref >60)
Glucose, Bld: 105 mg/dL — ABNORMAL HIGH (ref 65–99)
POTASSIUM: 4.9 mmol/L (ref 3.5–5.1)
SODIUM: 138 mmol/L (ref 135–145)

## 2015-12-23 LAB — RAPID URINE DRUG SCREEN, HOSP PERFORMED
Amphetamines: NOT DETECTED
BARBITURATES: NOT DETECTED
Benzodiazepines: NOT DETECTED
Cocaine: NOT DETECTED
Opiates: NOT DETECTED
Tetrahydrocannabinol: NOT DETECTED

## 2015-12-23 LAB — POC OCCULT BLOOD, ED: Fecal Occult Bld: NEGATIVE

## 2015-12-23 LAB — URINALYSIS, ROUTINE W REFLEX MICROSCOPIC
Bilirubin Urine: NEGATIVE
Glucose, UA: 100 mg/dL — AB
Ketones, ur: NEGATIVE mg/dL
Leukocytes, UA: NEGATIVE
Nitrite: NEGATIVE
Protein, ur: >300 mg/dL — AB
Specific Gravity, Urine: 1.014 (ref 1.005–1.030)
pH: 8 (ref 5.0–8.0)

## 2015-12-23 LAB — CBC
HEMATOCRIT: 28.5 % — AB (ref 39.0–52.0)
Hemoglobin: 8.9 g/dL — ABNORMAL LOW (ref 13.0–17.0)
MCH: 25.5 pg — ABNORMAL LOW (ref 26.0–34.0)
MCHC: 31.2 g/dL (ref 30.0–36.0)
MCV: 81.7 fL (ref 78.0–100.0)
PLATELETS: 231 10*3/uL (ref 150–400)
RBC: 3.49 MIL/uL — ABNORMAL LOW (ref 4.22–5.81)
RDW: 18.6 % — AB (ref 11.5–15.5)
WBC: 11.1 10*3/uL — AB (ref 4.0–10.5)

## 2015-12-23 LAB — I-STAT TROPONIN, ED: TROPONIN I, POC: 0.18 ng/mL — AB (ref 0.00–0.08)

## 2015-12-23 LAB — TROPONIN I: TROPONIN I: 0.19 ng/mL — AB (ref <0.031)

## 2015-12-23 MED ORDER — ASPIRIN 81 MG PO CHEW
81.0000 mg | CHEWABLE_TABLET | Freq: Every day | ORAL | Status: DC
  Administered 2015-12-24 – 2015-12-25 (×2): 81 mg via ORAL
  Filled 2015-12-23 (×2): qty 1

## 2015-12-23 MED ORDER — CARVEDILOL 25 MG PO TABS
50.0000 mg | ORAL_TABLET | Freq: Two times a day (BID) | ORAL | Status: DC
  Administered 2015-12-24 – 2015-12-25 (×2): 50 mg via ORAL
  Filled 2015-12-23 (×2): qty 2

## 2015-12-23 MED ORDER — ASPIRIN 325 MG PO TABS: 325.0000 mg | ORAL_TABLET | Freq: Once | ORAL | Status: DC

## 2015-12-23 MED ORDER — ACETAMINOPHEN 325 MG PO TABS
650.0000 mg | ORAL_TABLET | Freq: Four times a day (QID) | ORAL | Status: DC | PRN
  Administered 2015-12-23 – 2015-12-24 (×3): 650 mg via ORAL
  Filled 2015-12-23 (×4): qty 2

## 2015-12-23 MED ORDER — LIDOCAINE-PRILOCAINE 2.5-2.5 % EX CREA: 1.0000 "application " | TOPICAL_CREAM | CUTANEOUS | Status: DC | PRN

## 2015-12-23 MED ORDER — HEPARIN SODIUM (PORCINE) 5000 UNIT/ML IJ SOLN
5000.0000 [IU] | Freq: Three times a day (TID) | INTRAMUSCULAR | Status: DC
  Administered 2015-12-23 – 2015-12-25 (×3): 5000 [IU] via SUBCUTANEOUS
  Filled 2015-12-23 (×3): qty 1

## 2015-12-23 MED ORDER — VENLAFAXINE HCL ER 75 MG PO CP24
75.0000 mg | ORAL_CAPSULE | Freq: Every day | ORAL | Status: DC
  Administered 2015-12-24 – 2015-12-25 (×2): 75 mg via ORAL
  Filled 2015-12-23 (×2): qty 1

## 2015-12-23 MED ORDER — PREGABALIN 50 MG PO CAPS
50.0000 mg | ORAL_CAPSULE | Freq: Every day | ORAL | Status: DC
  Administered 2015-12-24 – 2015-12-25 (×2): 50 mg via ORAL
  Filled 2015-12-23 (×2): qty 1

## 2015-12-23 MED ORDER — ONDANSETRON HCL 4 MG/2ML IJ SOLN: 4.0000 mg | Freq: Four times a day (QID) | INTRAMUSCULAR | Status: DC | PRN

## 2015-12-23 MED ORDER — CLOPIDOGREL BISULFATE 75 MG PO TABS
75.0000 mg | ORAL_TABLET | Freq: Every day | ORAL | Status: DC
  Administered 2015-12-24 – 2015-12-25 (×2): 75 mg via ORAL
  Filled 2015-12-23 (×3): qty 1

## 2015-12-23 MED ORDER — CINACALCET HCL 30 MG PO TABS
90.0000 mg | ORAL_TABLET | Freq: Every day | ORAL | Status: DC
  Administered 2015-12-23 – 2015-12-24 (×2): 90 mg via ORAL
  Filled 2015-12-23 (×3): qty 3

## 2015-12-23 MED ORDER — ACETAMINOPHEN 650 MG RE SUPP: 650.0000 mg | Freq: Four times a day (QID) | RECTAL | Status: DC | PRN

## 2015-12-23 MED ORDER — SODIUM CHLORIDE 0.9% FLUSH
3.0000 mL | Freq: Two times a day (BID) | INTRAVENOUS | Status: DC
  Administered 2015-12-23 – 2015-12-25 (×2): 3 mL via INTRAVENOUS

## 2015-12-23 MED ORDER — ONDANSETRON HCL 4 MG PO TABS: 4.0000 mg | ORAL_TABLET | Freq: Four times a day (QID) | ORAL | Status: DC | PRN

## 2015-12-23 MED ORDER — LIDOCAINE HCL (PF) 1 % IJ SOLN: 5.0000 mL | INTRAMUSCULAR | Status: DC | PRN

## 2015-12-23 MED ORDER — ATORVASTATIN CALCIUM 20 MG PO TABS
20.0000 mg | ORAL_TABLET | Freq: Every day | ORAL | Status: DC
  Administered 2015-12-23 – 2015-12-24 (×2): 20 mg via ORAL
  Filled 2015-12-23 (×2): qty 1

## 2015-12-23 MED ORDER — SODIUM CHLORIDE 0.9 % IV BOLUS (SEPSIS)
500.0000 mL | Freq: Once | INTRAVENOUS | Status: AC
  Administered 2015-12-23: 500 mL via INTRAVENOUS

## 2015-12-23 MED ORDER — AMITRIPTYLINE HCL 25 MG PO TABS
100.0000 mg | ORAL_TABLET | Freq: Every day | ORAL | Status: DC
  Administered 2015-12-23 – 2015-12-24 (×2): 100 mg via ORAL
  Filled 2015-12-23 (×2): qty 4

## 2015-12-23 MED ORDER — ALTEPLASE 2 MG IJ SOLR: 2.0000 mg | Freq: Once | INTRAMUSCULAR | Status: DC | PRN

## 2015-12-23 MED ORDER — RENA-VITE PO TABS
1.0000 | ORAL_TABLET | Freq: Every day | ORAL | Status: DC
  Administered 2015-12-23 – 2015-12-24 (×2): 1 via ORAL
  Filled 2015-12-23 (×2): qty 1

## 2015-12-23 MED ORDER — SEVELAMER CARBONATE 800 MG PO TABS
2400.0000 mg | ORAL_TABLET | Freq: Three times a day (TID) | ORAL | Status: DC
  Administered 2015-12-24 – 2015-12-25 (×5): 2400 mg via ORAL
  Filled 2015-12-23 (×5): qty 3

## 2015-12-23 MED ORDER — HEPARIN SODIUM (PORCINE) 1000 UNIT/ML DIALYSIS: 20.0000 [IU]/kg | INTRAMUSCULAR | Status: DC | PRN

## 2015-12-23 MED ORDER — SODIUM CHLORIDE 0.9 % IV SOLN: 100.0000 mL | INTRAVENOUS | Status: DC | PRN

## 2015-12-23 MED ORDER — AMLODIPINE BESYLATE 10 MG PO TABS
10.0000 mg | ORAL_TABLET | Freq: Every day | ORAL | Status: DC
  Administered 2015-12-24 – 2015-12-25 (×2): 10 mg via ORAL
  Filled 2015-12-23 (×2): qty 1

## 2015-12-23 MED ORDER — LACTULOSE 10 GM/15ML PO SOLN
20.0000 g | Freq: Two times a day (BID) | ORAL | Status: DC
  Administered 2015-12-24 – 2015-12-25 (×2): 20 g via ORAL
  Filled 2015-12-23 (×3): qty 30

## 2015-12-23 MED ORDER — ISOSORBIDE MONONITRATE ER 30 MG PO TB24
30.0000 mg | ORAL_TABLET | Freq: Every day | ORAL | Status: DC
  Administered 2015-12-24 – 2015-12-25 (×2): 30 mg via ORAL
  Filled 2015-12-23 (×2): qty 1

## 2015-12-23 MED ORDER — HEPARIN SODIUM (PORCINE) 1000 UNIT/ML DIALYSIS: 1000.0000 [IU] | INTRAMUSCULAR | Status: DC | PRN

## 2015-12-23 MED ORDER — PENTAFLUOROPROP-TETRAFLUOROETH EX AERO: 1.0000 "application " | INHALATION_SPRAY | CUTANEOUS | Status: DC | PRN

## 2015-12-23 NOTE — Consult Note (Addendum)
CARDIOLOGY CONSULT NOTE   [name] [name] ID: [name] [name] MRN: [id number] DOB/AGE: Jan 13, 1956 60 y.o.  Admit date: 12/23/2015  Primary Physician   No PCP Per [name] [name] Primary Cardiologist   (Dr. Alanda Amass - [facility], last seen more than year ago). This year seen by different cardiologist - [facility] - never followed up in clinic. (Dr. Johnsie Cancel saw first) Reason for Consultation   Chest pain Requesting Physician  Dr. [name]  HPI: [name] [name] is a 60 y.o. 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 He has long standing hx of Missed dialysis causing him to have atypical chest pain due to volume overload and multiple admission due to this.  Last cardiac catheterization in 11/2014 showing normal LV function, no significant coronary obstructive disease with widely patent LAD stent in the proximal to midsegment with minimal 20% smooth in-stent narrowing and mild 20% ostial narrowing of the first diagonal branch within the stented segment.  Last echo 05/18/2015 showed LV EF of 45-50%, grade 2 DD - Direct image comparison with the study from February 2016confirms a slight reduction in LV systolic function and worsening secondary mitral insufficiency, as well as elevation in mean left atrial pressure.  Myoview on 05/19/2015 that was an intermediate risk study. There moderate sized and intensity fixed, mid to apical inferolateral wall defect consistent with scar. No significant reversible ischemia is noted. LVEF 42% with inferolateral hypokinesis.  [name] [name] was last admitted for dialysis only by the nephrology team on Wednesday 3/15. He was discharged after dialysis on the same day. For the past week, [name] [name] had a poor appetite and inability to tolerate  solids. [name] [name] had a few episodes of vomiting yesterday. The [name] [name] had a intermittent episode of sharp chest pain at substernal area lasting for a few seconds and resolved by itself. [name] [name] also been coughing. Movement makes it worse. Denies shortness of breath, lower extremity edema, orthopnea, PND or syncope.  EKG shows sinus rhythm with ventricular rate of 99 bpm. No acute changes. Point-of-care troponin 0.18. Chest x-ray shows mild interstitial edema.  Past Medical History  Diagnosis Date  . Hypertension   . Hematochezia     a. 2014: colonscopy, which showed moderately-sized internal hemorrhoids, two 67mm polyps in transverse colon and ascending colon that were resected, five 2-20mm polyps in sigmoid colon, descending colon, transverse colon, and ascending colon that were resected. An upper endoscopy was performed and showed normal esophagus, stomach, and duodenum.  . Hematuria     a. H/o hematuria 2014 with cystoscopy that was unrevealing for his source of hematuria. He underwent a kidney ultrasound on 10/14 that showed mildly echogenic and scarred kidneys compatible with medical renal disease, without hydronephrosis or renal calculi.  [name] Anemia   . CAD (coronary artery disease)     a. per [facility] s/p 3.32mm x 54mm Vision BMS to mid LAD 12/2009 and Xience DES to mid LAD 10/2010.  . Colon polyps   . Chronic diastolic CHF (congestive heart failure) ([place])   . Hyperlipidemia   . Anginal pain ([place])   . Heart murmur   . 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 Type II diabetes mellitus ([place])   . History of blood transfusion     "  had colonoscopy done; they had to give me some blood"  . Daily headache   . ESRD on dialysis [facility]) since ~ 2008    "Iron; TTS" (07/21/2015)  . On home oxygen therapy     "2L prn" (07/21/2015)  . Renal insufficiency      Past Surgical History  Procedure Laterality Date  . Left heart catheterization with coronary angiogram  N/A 11/23/2014    Procedure: LEFT HEART CATHETERIZATION WITH CORONARY ANGIOGRAM;  Surgeon: Troy Sine, MD;  Location: [facility] CATH LAB;  Service: Cardiovascular;  Laterality: N/A;  . Lithotripsy  X1  . Cystoscopy w/ stone manipulation  X2?  . Cardiac catheterization  "several"  . Coronary angioplasty with stent placement  "several"  . Eye surgery Bilateral     "laser OR for hemorrhage"  . Av fistula placement Left ~ 2007    "upper arm"    Allergies  Allergen Reactions  . Enalapril Hives    I have reviewed the [name] [name]'s current medications . lactulose  20 g Oral BID      Prior to Admission medications   Medication Sig Start Date End Date Taking? Authorizing Provider  acetaminophen (TYLENOL) 500 MG tablet Take 500 mg by mouth every 6 (six) hours as needed for headache.    Yes Historical Provider, MD  amitriptyline (ELAVIL) 100 MG tablet Take 1 tablet (100 mg total) by mouth at bedtime. 07/27/15  Yes Shela Leff, MD  amLODipine (NORVASC) 10 MG tablet Take 1 tablet (10 mg total) by mouth daily. 10/30/15  Yes Ripudeep Krystal Eaton, MD  aspirin 81 MG chewable tablet Chew 1 tablet (81 mg total) by mouth daily. 07/22/15  Yes Iline Oven, MD  atorvastatin (LIPITOR) 20 MG tablet Take 20 mg by mouth at bedtime.    Yes Historical Provider, MD  carvedilol (COREG) 25 MG tablet Take 2 tablets (50 mg total) by mouth 2 (two) times daily. 10/30/15  Yes Ripudeep Krystal Eaton, MD  cinacalcet (SENSIPAR) 90 MG tablet Take 90 mg by mouth at bedtime.    Yes Historical Provider, MD  clopidogrel (PLAVIX) 75 MG tablet Take 1 tablet (75 mg total) by mouth daily. 11/24/14  Yes Juluis Mire, MD  Darbepoetin Alfa (ARANESP) 25 MCG/0.42ML SOSY injection Inject 0.42 mLs (25 mcg total) into the vein every Tuesday with hemodialysis. 12/15/14  Yes Geradine Girt, DO  doxercalciferol (HECTOROL) 4 MCG/2ML injection Inject 1 mL (2 mcg total) into the vein Every Tuesday,Thursday,and Saturday with dialysis. 12/15/14  Yes Geradine Girt, DO  ferric gluconate 62.5 mg in sodium chloride 0.9 % 100 mL Inject 62.5 mg into the vein every Thursday with hemodialysis. 12/16/14  Yes Geradine Girt, DO  isosorbide mononitrate (IMDUR) 30 MG 24 hr tablet Take 1 tablet (30 mg total) by mouth daily. 10/30/15  Yes Ripudeep Krystal Eaton, MD  meclizine (ANTIVERT) 25 MG tablet Take 25 mg by mouth 3 (three) times daily as needed for dizziness.   Yes Historical Provider, MD  metoCLOPramide (REGLAN) 5 MG tablet Take 1 tablet (5 mg total) by mouth every 8 (eight) hours as needed for nausea or vomiting. 10/31/15  Yes Ripudeep Krystal Eaton, MD  multivitamin (RENA-VIT) TABS tablet Take 1 tablet by mouth at bedtime. 12/15/14  Yes Geradine Girt, DO  nitroGLYCERIN (NITROSTAT) 0.4 MG SL tablet Place 1 tablet (0.4 mg total) under the tongue every 5 (five) minutes as needed for chest pain. 06/05/15  Yes Janece Canterbury, MD  pantoprazole (PROTONIX) 40 MG tablet  Take 1 tablet (40 mg total) by mouth daily. [name] [name] taking differently: Take 40 mg by mouth daily as needed (for acid reflux).  06/05/15  Yes Janece Canterbury, MD  pregabalin (LYRICA) 50 MG capsule Take 1 capsule (50 mg total) by mouth daily. 12/15/14  Yes Geradine Girt, DO  sevelamer carbonate (RENVELA) 800 MG tablet Take 2,400 mg by mouth 3 (three) times daily with meals.    Yes Historical Provider, MD  simethicone (GAS-X) 80 MG chewable tablet Chew 1 tablet (80 mg total) by mouth every 6 (six) hours as needed for flatulence. 08/29/15  Yes Collier Salina, MD  venlafaxine XR (EFFEXOR XR) 75 MG 24 hr capsule Take 1 capsule (75 mg total) by mouth daily with breakfast. 07/27/15  Yes Shela Leff, MD  losartan (COZAAR) 100 MG tablet Take 1 tablet (100 mg total) by mouth at bedtime. [name] [name] not taking: Reported on 12/23/2015 10/30/15   [place], MD  UNABLE TO FIND Outpatient physical therapy  Diagnosis: generalized debility, ESRD on hemodialysis [name] [name] not taking: Reported on 12/23/2015 10/30/15   Ripudeep Krystal Eaton,  MD     Social History   Social History  . Marital Status: Divorced    Spouse Name: N/A  . Number of Children: N/A  . Years of Education: N/A   Occupational History  . Not on file.   Social History Main Topics  . Smoking status: Former Smoker -- 0.50 packs/day for 8 years    Types: Cigarettes    Quit date: 12/06/2010  . Smokeless tobacco: Never Used  . Alcohol Use: No  . Drug Use: No  . Sexual Activity: Not Currently   Other Topics Concern  . Not on file   Social History Narrative   Moved from [place], [place] to [place] in 11/2014.    Family Status  Relation Status Death Age  . Mother Deceased   . Father Deceased    Family History  Problem Relation Age of Onset  . Hypertension    . Bone cancer Mother   . Anuerysm Father   . Diabetes type II Daughter      ROS:  Full 14 point review of systems complete and found to be negative unless listed above.  Physical Exam: Blood pressure 175/98, pulse 99, temperature 98.3 F (36.8 C), temperature source Oral, resp. rate 22, height 6' (1.829 m), weight 207 lb 10.8 oz (94.2 kg), SpO2 99 %.  General: Well developed, well nourished, male in no acute distress Head: Eyes PERRLA, No xanthomas. Normocephalic and atraumatic, oropharynx without edema or exudate.  Lungs: Resp regular and unlabored, CTA. Heart: RRR no s3, s4, or murmurs..   Neck: No carotid bruits. No lymphadenopathy.  JVD. Abdomen: Bowel sounds present, Mildly tender to palpation in epigastric area.  Msk:  No spine or cva tenderness. No weakness, no joint deformities or effusions. Extremities: No clubbing, cyanosis or edema. DP/PT/Radials 2+ and equal bilaterally. Neuro: Alert and oriented X 3. No focal deficits noted. Psych:  Good affect, responds appropriately Skin: No rashes or lesions noted.  Labs:   Lab Results  Component Value Date   WBC 11.1* 12/23/2015   HGB 8.9* 12/23/2015   HCT 28.5* 12/23/2015   MCV 81.7 12/23/2015   PLT 231 12/23/2015   No  results for input(s): INR in the last 72 hours.  Recent Labs Lab 12/21/15 1107  12/23/15 1004  NA 139  < > 138  K 7.1*  < > 4.9  CL 99*  < >  95*  CO2 19*  < > 25  BUN 69*  < > 54*  CREATININE 13.59*  < > 11.47*  CALCIUM 8.6*  < > 8.1*  PROT 7.5  --   --   BILITOT 0.8  --   --   ALKPHOS 83  --   --   ALT 18  --   --   AST 15  --   --   GLUCOSE 99  < > 105*  ALBUMIN 3.5  --   --   < > = values in this interval not displayed. MAGNESIUM  Date Value Ref Range Status  12/21/2015 2.2 1.7 - 2.4 mg/dL Final    Recent Labs  12/21/15 1107  TROPONINI 0.04*    Recent Labs  12/21/15 1125 12/23/15 1100  TROPIPOC 0.03 0.18*   No results found for: PROBNP Lab Results  Component Value Date   CHOL 135 10/29/2015   HDL 17* 10/29/2015   LDLCALC 65 10/29/2015   TRIG 267* 10/29/2015   Lab Results  Component Value Date   DDIMER <0.27 04/27/2014   Echo:  LV EF: 45% - 50%  ------------------------------------------------------------------- Indications: Chest pain 786.51.  ------------------------------------------------------------------- History: PMH: Coronary artery disease. Congestive heart failure. Risk factors: ESRD. Hypertension. Diabetes mellitus.  ------------------------------------------------------------------- Study Conclusions  - Left ventricle: The cavity size was mildly dilated. Wall  thickness was normal. Systolic function was mildly reduced. The  estimated ejection fraction was in the range of 45% to 50%. Mild  diffuse hypokinesis with no identifiable regional variations.  Features are consistent with a pseudonormal left ventricular  filling pattern, with concomitant abnormal relaxation and  increased filling pressure (grade 2 diastolic dysfunction). - Aortic valve: There was trivial regurgitation. - Mitral valve: Moderately calcified annulus. There was moderate  regurgitation directed centrally. Valve area by pressure   half-time: 2.44 cm^2. Valve area by continuity equation (using  LVOT flow): 1.39 cm^2. - Left atrium: The atrium was severely dilated. - Right ventricle: The cavity size was moderately dilated. Systolic  function was mildly reduced. - Right atrium: The atrium was mildly dilated.  Impressions:  - Direct image comparison with the study from February 2016  confirms a slight reduction in LV systolic function and worsening  secondary mitral insufficiency, as well as elevation in mean left  atrial pressure.  ECG:   Vent. rate 99 BPM PR interval 153 ms QRS duration 101 ms QT/QTc 381/489 ms P-R-T axes 51 80 73   Radiology:  Dg Chest 2 View  12/23/2015  CLINICAL DATA:  Chest pain, weakness, nausea and vomiting EXAM: CHEST  2 VIEW COMPARISON:  12/20/2013 FINDINGS: Similar cardiomegaly with central vascular congestion/ interstitial edema pattern compatible with mild CHF. No focal pneumonia, collapse or consolidation. No significant effusion or pneumothorax. Coronary stents noted. Trachea is midline. Normal bowel gas pattern. IMPRESSION: Minor central interstitial edema pattern.  No significant change. Electronically Signed   By: Jerilynn Mages.  Shick M.D.   On: 12/23/2015 11:17   Dg Abd Portable 1v  12/23/2015  CLINICAL DATA:  Abdominal pain for 3 days.  Emesis. EXAM: PORTABLE ABDOMEN - 1 VIEW COMPARISON:  October 30, 2015 CT abdomen and pelvis FINDINGS: There is moderate stool throughout the colon. There is no bowel dilatation or air-fluid level suggesting obstruction. No free air is seen on this supine examination. There are vascular calcifications in the pelvis. IMPRESSION: Moderate stool throughout colon.  No bowel obstruction or free air. Electronically Signed   By: Lowella Grip III M.D.  On: 12/23/2015 13:48    ASSESSMENT AND PLAN:     [name] [name] is a 60 y.o. 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 He has long standing hx of Missed dialysis causing him to have atypical chest pain due to volume overload and multiple admission due to this.  Last echo 05/18/2015 showed LV EF of 45-50%, grade 2 DD - Direct image comparison with the study from February 2016confirms a slight reduction in LV systolic function and worsening secondary mitral insufficiency, as well as elevation in mean left atrial pressure.  Myoview on 05/19/2015 that was an intermediate risk study. There moderate sized and intensity fixed, mid to apical inferolateral wall defect consistent with scar. No significant reversible ischemia is noted. LVEF 42% with inferolateral hypokinesis.  Principal Problem:   Chest pain, atypical Active Problems:   Hypertension   CAD -S/P LAD BMS 2011, LAD DES 2012- patent cors Feb 2016   Diabetes mellitus type 2, diet-controlled ([place])   Diastolic dysfunction-grade 2   GERD (gastroesophageal reflux disease)   Diabetic neuropathy ([facility])   ESRD on hemodialysis ([place])   Emesis   Leukocytosis   Plan: Chest pain is atypical. [name] [name] admits that "he gets sharp chest pain whenever he is volume overloaded". No anginal pain. Mildly elevated troponin is likely due to chronic kidney disease and worsening of serum creatinine. Continue cycle troponin. [name] [name] needs hemodialysis for volume management.  SignedLeanor Kail, PA 12/23/2015, 3:00 PM Pager [phone]  Co-Sign MD  The [name] [name] was seen, examined and discussed with Bhagat,Bhavinkumar PA-C and I agree with the above.   [name] [name] is a 60 y.o. 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 He has long standing hx of missed dialysis causing him to have atypical chest pain due to volume overload and multiple admission due to this. Those episodes are associated with chest pain.  Last echo 05/18/2015 showed LV EF of 45-50%, grade 2 DD, Myoview on 05/19/2015 that was an intermediate risk study. There moderate sized and intensity fixed, mid to apical inferolateral wall defect consistent with scar. No significant reversible ischemia is noted. LVEF 42% with inferolateral hypokinesis. He has been experiencing URI, his pain is atypical, sharp, while he cough. Asymptomatic currently. Troponin is minimally elevated in the settings of CKD stahe IV. I would recommend to follow trend, no heparin needed unless significant increase.  Plan: Treat for URI and fluid overload with HD. We will follow. Increase imdur to 60 mg po daily for hypertension.  Dorothy Spark 12/23/2015

## 2015-12-23 NOTE — ED Notes (Signed)
Per EMS, patient here for weakness, nausea, vomiting.  Patient told EMS he has had no appetite for 5 days.   Patient last had dialysis on Wednesday for high potassium.   Patient normally has dialysis T, Thu, Sat.  Patient states he is having chest pain.

## 2015-12-23 NOTE — ED Notes (Signed)
Admit doctor at bedside

## 2015-12-23 NOTE — ED Notes (Signed)
Pt. Has a Positive Troponin, Called Results to [employer], [place], Pt. Will be moving to [address]

## 2015-12-23 NOTE — ED Provider Notes (Signed)
CSN: [id number]     Arrival date & time 12/23/15  [id number] History   First MD Initiated Contact with Patient 12/23/15 1141     Chief Complaint  Patient presents with  . Chest Pain  . Weakness     (Consider location/radiation/quality/duration/timing/severity/associated sxs/prior Treatment) The history is provided by the patient.  [name] [name] is a 60 y.o. male hx of HTN, internal hemorrhoids, CAD with stents, DM, ESRD on HD here with chest pain, nausea, vomiting.  No appetite for the last 5 days.  He had some dark stool as well.  He came in 2 days ago and had hyperkalemia at that time and was dialyzed. His usually dialysis days are Tues/Th/Sat but he missed his dialysis 3 days ago since he wasn't feeling well. Worsening substernal chest pain since yesterday with some shortness of breath. Denies abdominal pain. Has black stool as well.    Past Medical History  Diagnosis Date  . Hypertension   . Hematochezia     a. 2014: colonscopy, which showed moderately-sized internal hemorrhoids, two 33mm polyps in transverse colon and ascending colon that were resected, five 2-19mm polyps in sigmoid colon, descending colon, transverse colon, and ascending colon that were resected. An upper endoscopy was performed and showed normal esophagus, stomach, and duodenum.  . Hematuria     a. H/o hematuria 2014 with cystoscopy that was unrevealing for his source of hematuria. He underwent a kidney ultrasound on 10/14 that showed mildly echogenic and scarred kidneys compatible with medical renal disease, without hydronephrosis or renal calculi.  [name] Anemia   . CAD (coronary artery disease)     a. per [facility] s/p 3.57mm x 11mm Vision BMS to mid LAD 12/2009 and Xience DES to mid LAD 10/2010.  . Colon polyps   . Chronic diastolic CHF (congestive heart failure) ([place])   . Hyperlipidemia   . Anginal pain ([place])   . Heart murmur   . 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 Type II diabetes  mellitus ([place])   . History of blood transfusion     "had colonoscopy done; they had to give me some blood"  . Daily headache   . ESRD on dialysis [facility]) since ~ 2008    "[place]; TTS" (07/21/2015)  . On home oxygen therapy     "2L prn" (07/21/2015)  . Renal insufficiency    Past Surgical History  Procedure Laterality Date  . Left heart catheterization with coronary angiogram N/A 11/23/2014    Procedure: LEFT HEART CATHETERIZATION WITH CORONARY ANGIOGRAM;  Surgeon: Troy Sine, MD;  Location: [facility] CATH LAB;  Service: Cardiovascular;  Laterality: N/A;  . Lithotripsy  X1  . Cystoscopy w/ stone manipulation  X2?  . Cardiac catheterization  "several"  . Coronary angioplasty with stent placement  "several"  . Eye surgery Bilateral     "laser OR for hemorrhage"  . Av fistula placement Left ~ 2007    "upper arm"   Family History  Problem Relation Age of Onset  . Hypertension    . Bone cancer Mother   . Anuerysm Father   . Diabetes type II Daughter    Social History  Substance Use Topics  . Smoking status: Former Smoker -- 0.50 packs/day for 8 years    Types: Cigarettes    Quit date: 12/06/2010  . Smokeless tobacco: Never Used  . Alcohol Use: No    Review of Systems  Cardiovascular: Positive for chest  pain.  Neurological: Positive for weakness.  All other systems reviewed and are negative.     Allergies  Enalapril  Home Medications   Prior to Admission medications   Medication Sig Start Date End Date Taking? Authorizing Provider  acetaminophen (TYLENOL) 500 MG tablet Take 500 mg by mouth every 6 (six) hours as needed for headache.    Yes Historical Provider, MD  amitriptyline (ELAVIL) 100 MG tablet Take 1 tablet (100 mg total) by mouth at bedtime. 07/27/15  Yes Shela Leff, MD  amLODipine (NORVASC) 10 MG tablet Take 1 tablet (10 mg total) by mouth daily. 10/30/15  Yes Ripudeep Krystal Eaton, MD  aspirin 81 MG chewable tablet Chew 1 tablet (81 mg total) by mouth daily.  07/22/15  Yes Iline Oven, MD  atorvastatin (LIPITOR) 20 MG tablet Take 20 mg by mouth at bedtime.    Yes Historical Provider, MD  carvedilol (COREG) 25 MG tablet Take 2 tablets (50 mg total) by mouth 2 (two) times daily. 10/30/15  Yes Ripudeep Krystal Eaton, MD  cinacalcet (SENSIPAR) 90 MG tablet Take 90 mg by mouth at bedtime.    Yes Historical Provider, MD  clopidogrel (PLAVIX) 75 MG tablet Take 1 tablet (75 mg total) by mouth daily. 11/24/14  Yes Juluis Mire, MD  Darbepoetin Alfa (ARANESP) 25 MCG/0.42ML SOSY injection Inject 0.42 mLs (25 mcg total) into the vein every Tuesday with hemodialysis. 12/15/14  Yes Geradine Girt, DO  doxercalciferol (HECTOROL) 4 MCG/2ML injection Inject 1 mL (2 mcg total) into the vein Every Tuesday,Thursday,and Saturday with dialysis. 12/15/14  Yes Geradine Girt, DO  ferric gluconate 62.5 mg in sodium chloride 0.9 % 100 mL Inject 62.5 mg into the vein every Thursday with hemodialysis. 12/16/14  Yes Geradine Girt, DO  isosorbide mononitrate (IMDUR) 30 MG 24 hr tablet Take 1 tablet (30 mg total) by mouth daily. 10/30/15  Yes Ripudeep Krystal Eaton, MD  meclizine (ANTIVERT) 25 MG tablet Take 25 mg by mouth 3 (three) times daily as needed for dizziness.   Yes Historical Provider, MD  metoCLOPramide (REGLAN) 5 MG tablet Take 1 tablet (5 mg total) by mouth every 8 (eight) hours as needed for nausea or vomiting. 10/31/15  Yes Ripudeep Krystal Eaton, MD  multivitamin (RENA-VIT) TABS tablet Take 1 tablet by mouth at bedtime. 12/15/14  Yes Geradine Girt, DO  nitroGLYCERIN (NITROSTAT) 0.4 MG SL tablet Place 1 tablet (0.4 mg total) under the tongue every 5 (five) minutes as needed for chest pain. 06/05/15  Yes Janece Canterbury, MD  pantoprazole (PROTONIX) 40 MG tablet Take 1 tablet (40 mg total) by mouth daily. Patient taking differently: Take 40 mg by mouth daily as needed (for acid reflux).  06/05/15  Yes Janece Canterbury, MD  pregabalin (LYRICA) 50 MG capsule Take 1 capsule (50 mg total) by mouth daily.  12/15/14  Yes Geradine Girt, DO  sevelamer carbonate (RENVELA) 800 MG tablet Take 2,400 mg by mouth 3 (three) times daily with meals.    Yes Historical Provider, MD  simethicone (GAS-X) 80 MG chewable tablet Chew 1 tablet (80 mg total) by mouth every 6 (six) hours as needed for flatulence. 08/29/15  Yes Collier Salina, MD  venlafaxine XR (EFFEXOR XR) 75 MG 24 hr capsule Take 1 capsule (75 mg total) by mouth daily with breakfast. 07/27/15  Yes Shela Leff, MD  losartan (COZAAR) 100 MG tablet Take 1 tablet (100 mg total) by mouth at bedtime. Patient not taking: Reported on 12/23/2015 10/30/15  Ripudeep Krystal Eaton, MD  UNABLE TO FIND Outpatient physical therapy  Diagnosis: generalized debility, ESRD on hemodialysis Patient not taking: Reported on 12/23/2015 10/30/15   Ripudeep K Rai, MD   BP 170/104 mmHg  Pulse 97  Temp(Src) 98.3 F (36.8 C) (Oral)  Resp 22  Ht 6' (1.829 m)  Wt 207 lb 10.8 oz (94.2 kg)  BMI 28.16 kg/m2  SpO2 98% Physical Exam  Constitutional: He is oriented to person, place, and time.  Chronically ill, slightly pale   HENT:  Head: Normocephalic.  Eyes: Conjunctivae are normal. Pupils are equal, round, and reactive to light.  Neck: Normal range of motion. Neck supple.  Cardiovascular: Normal rate, regular rhythm and normal heart sounds.   Pulmonary/Chest: Effort normal.  Mild crackles bilateral bases   Abdominal: Soft. Bowel sounds are normal. He exhibits no distension. There is no tenderness. There is no rebound.  Musculoskeletal: Normal range of motion.  Neurological: He is alert and oriented to person, place, and time.  Skin: Skin is warm.  Psychiatric: He has a normal mood and affect. His behavior is normal. Judgment and thought content normal.  Nursing note and vitals reviewed.   ED Course  Procedures (including critical care time) Labs Review Labs Reviewed  BASIC METABOLIC PANEL - Abnormal; Notable for the following:    Chloride 95 (*)    Glucose, Bld  105 (*)    BUN 54 (*)    Creatinine, Ser 11.47 (*)    Calcium 8.1 (*)    GFR calc non Af Amer 4 (*)    GFR calc Af Amer 5 (*)    Anion gap 18 (*)    All other components within normal limits  CBC - Abnormal; Notable for the following:    WBC 11.1 (*)    RBC 3.49 (*)    Hemoglobin 8.9 (*)    HCT 28.5 (*)    MCH 25.5 (*)    RDW 18.6 (*)    All other components within normal limits  I-STAT TROPOININ, ED - Abnormal; Notable for the following:    Troponin i, poc 0.18 (*)    All other components within normal limits  POC OCCULT BLOOD, ED    Imaging Review Dg Chest 2 View  12/23/2015  CLINICAL DATA:  Chest pain, weakness, nausea and vomiting EXAM: CHEST  2 VIEW COMPARISON:  12/20/2013 FINDINGS: Similar cardiomegaly with central vascular congestion/ interstitial edema pattern compatible with mild CHF. No focal pneumonia, collapse or consolidation. No significant effusion or pneumothorax. Coronary stents noted. Trachea is midline. Normal bowel gas pattern. IMPRESSION: Minor central interstitial edema pattern.  No significant change. Electronically Signed   By: Jerilynn Mages.  Shick M.D.   On: 12/23/2015 11:17   I have personally reviewed and evaluated these images and lab results as part of my medical decision-making.   EKG Interpretation   Date/Time:  Friday December 23 2015 11:42:40 EDT Ventricular Rate:  99 PR Interval:  153 QRS Duration: 101 QT Interval:  381 QTC Calculation: 489 R Axis:   80 Text Interpretation:  Sinus rhythm Probable left atrial enlargement  Borderline prolonged QT interval No significant change since last tracing  Confirmed by YAO  MD, DAVID ([id number]) on 12/23/2015 11:45:45 AM      MDM   Final diagnoses:  None   [name] [name] is a 60 y.o. male  Here with nausea, weakness, black stool, chest pain. Has gastro symptoms but concerned for GI bleed vs ACS. Will get labs, guiac, CXR.  12:58 PM Guiac neg. Trop 0.18. Hx of cardiac stents. Appears slightly dehydrated so given  500 cc bolus. Hg dropped to 8.9 from 10 several days ago. I held ASA given dark stools. Cardiology to consult on patient. Consider demand ischemia vs ACS. Not hyperkalemic and consulted nephrology to put him on the list for dialysis. Will admit for r/o ACS.    Wandra Arthurs, MD 12/23/15 [phone]

## 2015-12-23 NOTE — ED Notes (Signed)
Patient states does make urine and will attempt to provide a sample for test. States does not want a in and out cath.

## 2015-12-23 NOTE — H&P (Signed)
[facility] History and Physical                                                                                    [name] [name], is a 60 y.o. male  MRN: [id number]   DOB - 03-19-1956  Admit Date - 12/23/2015  Outpatient Primary MD for the [name] is [name] [name]  Referring MD: [name] / ER  Consulting M.D: [name]  PMH: Past Medical History  Diagnosis Date  . Hypertension   . Hematochezia     a. 2014: colonscopy, which showed moderately-sized internal hemorrhoids, two 53mm polyps in transverse colon and ascending colon that were resected, five 2-40mm polyps in sigmoid colon, descending colon, transverse colon, and ascending colon that were resected. An upper endoscopy was performed and showed normal esophagus, stomach, and duodenum.  . Hematuria     a. H/o hematuria 2014 with cystoscopy that was unrevealing for his source of hematuria. He underwent a kidney ultrasound on 10/14 that showed mildly echogenic and scarred kidneys compatible with medical renal disease, without hydronephrosis or renal calculi.  [name] Anemia   . CAD (coronary artery disease)     a. per [facility] s/p 3.18mm x 60mm Vision BMS to mid LAD 12/2009 and Xience DES to mid LAD 10/2010.  . Colon polyps   . Chronic diastolic CHF (congestive heart failure) ([name])   . Hyperlipidemia   . Anginal pain ([place])   . Heart murmur   . 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 Type II diabetes mellitus ([name])   . History of blood transfusion     "had colonoscopy done; they had to give me some blood"  . Daily headache   . ESRD on dialysis [name]) since ~ 2008    "[name]; TTS" (07/21/2015)  . On home oxygen therapy     "2L prn" (07/21/2015)  . Renal insufficiency       PSH: Past Surgical History  Procedure Laterality Date  . Left heart catheterization with coronary angiogram N/A 11/23/2014    Procedure: LEFT HEART CATHETERIZATION WITH CORONARY ANGIOGRAM;  Surgeon: Troy Sine,  MD;  Location: [facility] CATH LAB;  Service: Cardiovascular;  Laterality: N/A;  . Lithotripsy  X1  . Cystoscopy w/ stone manipulation  X2?  . Cardiac catheterization  "several"  . Coronary angioplasty with stent placement  "several"  . Eye surgery Bilateral     "laser OR for hemorrhage"  . Av fistula placement Left ~ 2007    "upper arm"     CC:  Chief Complaint  [name] presents with  . Chest Pain  . Weakness     HPI: 60 year old male [name] with history of diabetes previously on insulin but now diet controlled, hypertension with concomitant grade 2 left ventricular diastolic dysfunction, history of CAD status post prior stents to the LAD in 2011 and 2012 with patent coronary arteries every 2016 catheterization, chronic kidney disease on dialysis, GERD, diabetic peripheral neuropathy [name] has a history of noncompliance with dialysis treatments. He admitted that he tends to cramp while on the dialysis machine and stops treatments early. [name]  was last admitted for dialysis only by the nephrology team on Wednesday 3/15. He was discharged after dialysis on the same day. [name] reports a one-week issue of poor appetite and inability to tolerate solids. He reports vomiting after fluids. States last BM was about 3 days ago. Has not had fevers chills or diarrhea. Has not started any new medications or stopped any medications. Adamantly denies use of narcotics or illegal drugs. States takes Lyrica for peripheral neuropathy. He's also complaining of trouble chest pain nonradiating that he describes as occurring "when I have to much fluid on any dialysis". He reported to the attending M.D. that he went to his dialysis session is scheduled yesterday on 3/16 but because he had been dialyzed through the ER on 3/15 the center of same the [name] had been admitted and gave his spot away.  ER Evaluation and treatment: Afebrile, BP 149/97, pulse 99 respirations 18, room air saturations 98%; weight 207  pounds EKG: Sinus rhythm with ventricular rate 99 bpm, QTC 489 ms, [name] ischemic changes and unchanged from previous EKG 2 view CXR: Mild central interstitial edema pattern without significant change from previous chest x-rays Laboratory data: Na 138, K 4.9, BUN 54, Cr 11.47, anion gap 18, troponin 0.18, WBCs 11,100; hemoglobin 8.9, platelets 231,000, FOB neg; influenza PCR negative on 3/15; blood cultures from 3/15 [name] growth to date Normal saline IV bolus 500 mL 1 in the ER  Review of Systems   In addition to the HPI above,  [name] Fever-chills, myalgias or other constitutional symptoms [name] Headache, changes with Vision or hearing, new weakness, tingling, numbness in any extremity, [name] problems swallowing food or Liquids, indigestion/reflux [name] Chest pain, Cough or Shortness of Breath, palpitations, orthopnea or DOE [name] melena or hematochezia, [name] dark tarry stools [name] dysuria, hematuria or flank pain [name] new skin rashes, lesions, masses or bruises, [name] new joints pains-aches [name] polyuria, polydypsia or polyphagia,  *A full 10 point Review of Systems was done, except as stated above, all other Review of Systems were negative.  Social History Social History  Substance Use Topics  . Smoking status: Former Smoker -- 0.50 packs/day for 8 years    Types: Cigarettes    Quit date: 12/06/2010  . Smokeless tobacco: Never Used  . Alcohol Use: [name]    Resides at: Private residence  Lives with: Alone  Ambulatory status: Without assistive devices   Family History Family History  Problem Relation Age of Onset  . Hypertension    . Bone cancer Mother   . Anuerysm Father   . Diabetes type II Daughter      Prior to Admission medications   Medication Sig Start Date End Date Taking? Authorizing Provider  acetaminophen (TYLENOL) 500 MG tablet Take 500 mg by mouth every 6 (six) hours as needed for headache.    Yes Historical Provider, MD  amitriptyline (ELAVIL) 100 MG tablet Take 1 tablet (100 mg total)  by mouth at bedtime. 07/27/15  Yes Shela Leff, MD  amLODipine (NORVASC) 10 MG tablet Take 1 tablet (10 mg total) by mouth daily. 10/30/15  Yes Ripudeep Krystal Eaton, MD  aspirin 81 MG chewable tablet Chew 1 tablet (81 mg total) by mouth daily. 07/22/15  Yes Iline Oven, MD  atorvastatin (LIPITOR) 20 MG tablet Take 20 mg by mouth at bedtime.    Yes Historical Provider, MD  carvedilol (COREG) 25 MG tablet Take 2 tablets (50 mg total) by mouth 2 (two) times daily. 10/30/15  Yes Ripudeep Krystal Eaton,  MD  cinacalcet (SENSIPAR) 90 MG tablet Take 90 mg by mouth at bedtime.    Yes Historical Provider, MD  clopidogrel (PLAVIX) 75 MG tablet Take 1 tablet (75 mg total) by mouth daily. 11/24/14  Yes Juluis Mire, MD  Darbepoetin Alfa (ARANESP) 25 MCG/0.42ML SOSY injection Inject 0.42 mLs (25 mcg total) into the vein every Tuesday with hemodialysis. 12/15/14  Yes Geradine Girt, DO  doxercalciferol (HECTOROL) 4 MCG/2ML injection Inject 1 mL (2 mcg total) into the vein Every Tuesday,Thursday,and Saturday with dialysis. 12/15/14  Yes Geradine Girt, DO  ferric gluconate 62.5 mg in sodium chloride 0.9 % 100 mL Inject 62.5 mg into the vein every Thursday with hemodialysis. 12/16/14  Yes Geradine Girt, DO  isosorbide mononitrate (IMDUR) 30 MG 24 hr tablet Take 1 tablet (30 mg total) by mouth daily. 10/30/15  Yes Ripudeep Krystal Eaton, MD  meclizine (ANTIVERT) 25 MG tablet Take 25 mg by mouth 3 (three) times daily as needed for dizziness.   Yes Historical Provider, MD  metoCLOPramide (REGLAN) 5 MG tablet Take 1 tablet (5 mg total) by mouth every 8 (eight) hours as needed for nausea or vomiting. 10/31/15  Yes Ripudeep Krystal Eaton, MD  multivitamin (RENA-VIT) TABS tablet Take 1 tablet by mouth at bedtime. 12/15/14  Yes Geradine Girt, DO  nitroGLYCERIN (NITROSTAT) 0.4 MG SL tablet Place 1 tablet (0.4 mg total) under the tongue every 5 (five) minutes as needed for chest pain. 06/05/15  Yes Janece Canterbury, MD  pantoprazole (PROTONIX) 40 MG  tablet Take 1 tablet (40 mg total) by mouth daily. [name] taking differently: Take 40 mg by mouth daily as needed (for acid reflux).  06/05/15  Yes Janece Canterbury, MD  pregabalin (LYRICA) 50 MG capsule Take 1 capsule (50 mg total) by mouth daily. 12/15/14  Yes Geradine Girt, DO  sevelamer carbonate (RENVELA) 800 MG tablet Take 2,400 mg by mouth 3 (three) times daily with meals.    Yes Historical Provider, MD  simethicone (GAS-X) 80 MG chewable tablet Chew 1 tablet (80 mg total) by mouth every 6 (six) hours as needed for flatulence. 08/29/15  Yes Collier Salina, MD  venlafaxine XR (EFFEXOR XR) 75 MG 24 hr capsule Take 1 capsule (75 mg total) by mouth daily with breakfast. 07/27/15  Yes Shela Leff, MD  losartan (COZAAR) 100 MG tablet Take 1 tablet (100 mg total) by mouth at bedtime. [name] not taking: Reported on 12/23/2015 10/30/15   [place], MD  UNABLE TO FIND Outpatient physical therapy  Diagnosis: generalized debility, ESRD on hemodialysis [name] not taking: Reported on 12/23/2015 10/30/15   Ripudeep Krystal Eaton, MD    Allergies  Allergen Reactions  . Enalapril Hives    Physical Exam  Vitals  Blood pressure 175/98, pulse 99, temperature 98.3 F (36.8 C), temperature source Oral, resp. rate 22, height 6' (1.829 m), weight 207 lb 10.8 oz (94.2 kg), SpO2 99 %.   General:  In [name] acute distress, appears Chronically ill and stated age  Psych:  Normal affect, Denies Suicidal or Homicidal ideations, Awake Alert, Oriented X 3. Speech and thought patterns are clear and appropriate, [name] apparent short term memory deficits  Neuro:   [name] focal neurological deficits, CN II through XII intact, Strength 5/5 all 4 extremities, Sensation intact all 4 extremities.  ENT:  Ears and Eyes appear Normal, Conjunctivae clear, PER. Moist oral mucosa without erythema or exudates.  Neck:  Supple, [name] lymphadenopathy appreciated  Respiratory:  Symmetrical chest wall  movement, Good air movement  bilaterally, CTAB. Room Air  Cardiac:  RRR, [name] Murmurs, [name] LE edema noted, [name] JVD, [name] carotid bruits, peripheral pulses palpable at 2+  Abdomen:  Positive bowel sounds, Soft, mildly tender epigastrium without guarding or rebounding, Non distended,  [name] masses appreciated, [name] obvious hepatosplenomegaly  Skin:  [name] Cyanosis, Normal Skin Turgor, [name] Skin Rash or Bruise.  Extremities: Symmetrical without obvious trauma or injury,  [name] effusions.  Data Review  CBC  Recent Labs Lab 12/21/15 1107 12/23/15 1004  WBC 10.2 11.1*  HGB 9.8* 8.9*  HCT 32.2* 28.5*  PLT 252 231  MCV 81.7 81.7  MCH 24.9* 25.5*  MCHC 30.4 31.2  RDW 18.1* 18.6*  LYMPHSABS 0.9  --   MONOABS 1.1*  --   EOSABS 0.2  --   BASOSABS 0.0  --     Chemistries   Recent Labs Lab 12/21/15 1107 12/21/15 1426 12/23/15 1004  NA 139 140 138  K 7.1* 4.9 4.9  CL 99* 101 95*  CO2 19* 19* 25  GLUCOSE 99 88 105*  BUN 69* 68* 54*  CREATININE 13.59* 13.03* 11.47*  CALCIUM 8.6* 8.3* 8.1*  MG 2.2  --   --   AST 15  --   --   ALT 18  --   --   ALKPHOS 83  --   --   BILITOT 0.8  --   --     estimated creatinine clearance is 8.5 mL/min (by C-G formula based on Cr of 11.47).  [name] results for input(s): TSH, T4TOTAL, T3FREE, THYROIDAB in the last 72 hours.  Invalid input(s): FREET3  Coagulation profile [name] results for input(s): INR, PROTIME in the last 168 hours.  [name] results for input(s): DDIMER in the last 72 hours.  Cardiac Enzymes  Recent Labs Lab 12/21/15 1107  TROPONINI 0.04*    Invalid input(s): POCBNP  Urinalysis    Component Value Date/Time   COLORURINE YELLOW 02/22/2015 2343   APPEARANCEUR CLEAR 02/22/2015 2343   LABSPEC 1.017 02/22/2015 2343   PHURINE 8.0 02/22/2015 2343   GLUCOSEU 250* 02/22/2015 2343   HGBUR MODERATE* 02/22/2015 2343   BILIRUBINUR NEGATIVE 02/22/2015 2343   KETONESUR NEGATIVE 02/22/2015 2343   PROTEINUR >300* 02/22/2015 2343   UROBILINOGEN 0.2 02/22/2015 2343   NITRITE  NEGATIVE 02/22/2015 2343   LEUKOCYTESUR SMALL* 02/22/2015 2343    Imaging results:   Dg Chest 2 View  12/23/2015  CLINICAL DATA:  Chest pain, weakness, nausea and vomiting EXAM: CHEST  2 VIEW COMPARISON:  12/20/2013 FINDINGS: Similar cardiomegaly with central vascular congestion/ interstitial edema pattern compatible with mild CHF. [name] focal pneumonia, collapse or consolidation. [name] significant effusion or pneumothorax. Coronary stents noted. Trachea is midline. Normal bowel gas pattern. IMPRESSION: Minor central interstitial edema pattern.  [name] significant change. Electronically Signed   By: Jerilynn Mages.  Shick M.D.   On: 12/23/2015 11:17   Dg Chest 2 View  12/21/2015  CLINICAL DATA:  Cough for 5 days EXAM: CHEST  2 VIEW COMPARISON:  October 28, 2015 FINDINGS: Mild generalized interstitial edema remains. There is [name] airspace consolidation. Heart is mildly enlarged with pulmonary venous hypertension. [name] adenopathy. There is a coronary stent in the left anterior descending coronary artery. [name] bone lesions evident. IMPRESSION: Evidence a degree of congestive heart failure. [name] airspace consolidation. Electronically Signed   By: Lowella Grip III M.D.   On: 12/21/2015 10:44     EKG: (Independently reviewed)  Sinus rhythm with ventricular rate 99 bpm, QTC 489  ms, [name] ischemic changes and unchanged from previous EKG   Assessment & Plan  Principal Problem:   Chest pain, atypical/CAD -S/P LAD BMS 2011, LAD DES 2012- patent cors Feb 2016 -[name] currently having waxing and waning chest discomfort that he reports is typical for when he becomes volume overloaded and not typical for previous ischemic presentations -Admit to telemetry/Obs -Initial troponin mildly elevated at 0.18 and this could be reflective of volume overload but as precaution we'll cycle troponin -Cardiology has been consulted by EDP -EKG reassuring and does not appear to be ischemic -Continue preadmission aspirin, Lipitor, Imdur, carvedilol,  Plavix  Active Problems:   Emesis -[name] reports nonspecific nonbloody emesis for about a week on and off with attempts to ingest food -Last BM 3 days ago so we'll check portable abdominal series to ensure [name] not profoundly constipated -Suspect with history of diabetes (likely uncontrolled for long periods of time) [name] may have diabetic gastroparesis -Has when necessary Reglan available prior to admission but will hold for now until clarify etiology of symptoms -Does take Elavil as well as low-dose Lyrica; denies narcotics but will check UDS    Hypertension/Diastolic dysfunction-grade 2 -Current blood pressure somewhat uncontrolled and likely reflective of need for dialysis -Continue preadmission Norvasc and carvedilol -Not hypoxemic and [name] peripheral edema does have mild edematous pattern on chest x-ray which is consistent for chronic diastolic LV dysfunction with exacerbation    ESRD on hemodialysis  -Nephrology aware of admission    Leukocytosis -[name] fever [name] clear source of infection -Check urinalysis and culture -Blood cultures from 3/15 [name] growth to date -Influenza -3/15    Diabetes mellitus type 2, diet-controlled -Hemoglobin A1c was 6.1. Weight 2017 and [name] currently not on medications    GERD  -May also be contributing to [name]'s chest pain -Hold Protonix initially    Diabetic neuropathy  -Continue Elavil and low-dose Lyrica    DVT Prophylaxis: Subcutaneous heparin  Family Communication:   [name] family at bedside  Code Status:  Full code  Condition: Stable   Discharge disposition: To [place] discharge back to previous home environment  Time spent in minutes : 60      ELLIS,ALLISON L. ANP on 12/23/2015 at 1:35 PM  You may contact me by going to [email or web address] - password [email or web address]  I am available from 7a-7p but please confirm I am on the schedule by going to Amion as above.   After 7p please contact night coverage person covering me after  hours  [facility]

## 2015-12-23 NOTE — Progress Notes (Signed)
ESRD, inadequate dialysis Chest pain CAD s/p PTA/DES     Plan HD tonight  Subjective: Interval History: He had an urgent hemodialysis Wednesday as an inpatient.  He was to receive dialysis as an outpt on Thursday but did not get treatment although he reported to kidney center.  I do not understand that situation and center is currently closed.  He presents now with chest pain he thinks related to fluid and troponin is elevated.  Objective: Vital signs in last 24 hours: Temp:  [98.3 F (36.8 C)] 98.3 F (36.8 C) (03/17 0956) Pulse Rate:  [96-108] 100 (03/17 1700) Resp:  [18-31] 31 (03/17 1700) BP: (128-175)/(83-112) 158/94 mmHg (03/17 1700) SpO2:  [93 %-100 %] 99 % (03/17 1700) Weight:  [94.2 kg (207 lb 10.8 oz)] 94.2 kg (207 lb 10.8 oz) (03/17 0951) Weight change:   Intake/Output from previous day:   Intake/Output this shift: Total I/O In: -  Out: 50 [Urine:50]  General appearance: alert and cooperative Resp: clear to auscultation bilaterally Cardio: regular rate and rhythm, S1, S2 normal, no murmur, click, rub or gallop Extremities: AVF LUE, tr edema onl;y  Lab Results:  Recent Labs  12/21/15 1107 12/23/15 1004  WBC 10.2 11.1*  HGB 9.8* 8.9*  HCT 32.2* 28.5*  PLT 252 231   BMET:  Recent Labs  12/21/15 1426 12/23/15 1004  NA 140 138  K 4.9 4.9  CL 101 95*  CO2 19* 25  GLUCOSE 88 105*  BUN 68* 54*  CREATININE 13.03* 11.47*  CALCIUM 8.3* 8.1*   No results for input(s): PTH in the last 72 hours. Iron Studies: No results for input(s): IRON, TIBC, TRANSFERRIN, FERRITIN in the last 72 hours. Studies/Results: Dg Chest 2 View  12/23/2015  CLINICAL DATA:  Chest pain, weakness, nausea and vomiting EXAM: CHEST  2 VIEW COMPARISON:  12/20/2013 FINDINGS: Similar cardiomegaly with central vascular congestion/ interstitial edema pattern compatible with mild CHF. No focal pneumonia, collapse or consolidation. No significant effusion or pneumothorax. Coronary stents noted.  Trachea is midline. Normal bowel gas pattern. IMPRESSION: Minor central interstitial edema pattern.  No significant change. Electronically Signed   By: Jerilynn Mages.  Shick M.D.   On: 12/23/2015 11:17   Dg Abd Portable 1v  12/23/2015  CLINICAL DATA:  Abdominal pain for 3 days.  Emesis. EXAM: PORTABLE ABDOMEN - 1 VIEW COMPARISON:  October 30, 2015 CT abdomen and pelvis FINDINGS: There is moderate stool throughout the colon. There is no bowel dilatation or air-fluid level suggesting obstruction. No free air is seen on this supine examination. There are vascular calcifications in the pelvis. IMPRESSION: Moderate stool throughout colon.  No bowel obstruction or free air. Electronically Signed   By: Lowella Grip III M.D.   On: 12/23/2015 13:48         Shaquira Moroz C 12/23/2015,6:00 PM

## 2015-12-24 DIAGNOSIS — I2510 Atherosclerotic heart disease of native coronary artery without angina pectoris: Secondary | ICD-10-CM

## 2015-12-24 DIAGNOSIS — R7989 Other specified abnormal findings of blood chemistry: Secondary | ICD-10-CM

## 2015-12-24 DIAGNOSIS — I132 Hypertensive heart and chronic kidney disease with heart failure and with stage 5 chronic kidney disease, or end stage renal disease: Secondary | ICD-10-CM | POA: Diagnosis present

## 2015-12-24 DIAGNOSIS — Z7902 Long term (current) use of antithrombotics/antiplatelets: Secondary | ICD-10-CM | POA: Diagnosis not present

## 2015-12-24 DIAGNOSIS — I5042 Chronic combined systolic (congestive) and diastolic (congestive) heart failure: Secondary | ICD-10-CM | POA: Diagnosis present

## 2015-12-24 DIAGNOSIS — R079 Chest pain, unspecified: Secondary | ICD-10-CM | POA: Diagnosis not present

## 2015-12-24 DIAGNOSIS — Z87891 Personal history of nicotine dependence: Secondary | ICD-10-CM | POA: Diagnosis not present

## 2015-12-24 DIAGNOSIS — E1122 Type 2 diabetes mellitus with diabetic chronic kidney disease: Secondary | ICD-10-CM | POA: Diagnosis present

## 2015-12-24 DIAGNOSIS — E1142 Type 2 diabetes mellitus with diabetic polyneuropathy: Secondary | ICD-10-CM | POA: Diagnosis present

## 2015-12-24 DIAGNOSIS — Z955 Presence of coronary angioplasty implant and graft: Secondary | ICD-10-CM | POA: Diagnosis not present

## 2015-12-24 DIAGNOSIS — E1129 Type 2 diabetes mellitus with other diabetic kidney complication: Secondary | ICD-10-CM | POA: Diagnosis not present

## 2015-12-24 DIAGNOSIS — R778 Other specified abnormalities of plasma proteins: Secondary | ICD-10-CM | POA: Diagnosis present

## 2015-12-24 DIAGNOSIS — E875 Hyperkalemia: Secondary | ICD-10-CM | POA: Diagnosis not present

## 2015-12-24 DIAGNOSIS — N186 End stage renal disease: Secondary | ICD-10-CM | POA: Diagnosis not present

## 2015-12-24 DIAGNOSIS — K219 Gastro-esophageal reflux disease without esophagitis: Secondary | ICD-10-CM | POA: Diagnosis present

## 2015-12-24 DIAGNOSIS — Z9115 Patient's noncompliance with renal dialysis: Secondary | ICD-10-CM | POA: Diagnosis not present

## 2015-12-24 DIAGNOSIS — Z992 Dependence on renal dialysis: Secondary | ICD-10-CM | POA: Diagnosis not present

## 2015-12-24 DIAGNOSIS — E785 Hyperlipidemia, unspecified: Secondary | ICD-10-CM | POA: Diagnosis present

## 2015-12-24 DIAGNOSIS — I519 Heart disease, unspecified: Secondary | ICD-10-CM

## 2015-12-24 DIAGNOSIS — Z7982 Long term (current) use of aspirin: Secondary | ICD-10-CM | POA: Diagnosis not present

## 2015-12-24 DIAGNOSIS — I120 Hypertensive chronic kidney disease with stage 5 chronic kidney disease or end stage renal disease: Secondary | ICD-10-CM | POA: Diagnosis not present

## 2015-12-24 DIAGNOSIS — Z9861 Coronary angioplasty status: Secondary | ICD-10-CM

## 2015-12-24 DIAGNOSIS — R0789 Other chest pain: Secondary | ICD-10-CM | POA: Diagnosis not present

## 2015-12-24 DIAGNOSIS — Z8249 Family history of ischemic heart disease and other diseases of the circulatory system: Secondary | ICD-10-CM | POA: Diagnosis not present

## 2015-12-24 DIAGNOSIS — E0842 Diabetes mellitus due to underlying condition with diabetic polyneuropathy: Secondary | ICD-10-CM | POA: Diagnosis not present

## 2015-12-24 DIAGNOSIS — E119 Type 2 diabetes mellitus without complications: Secondary | ICD-10-CM | POA: Diagnosis not present

## 2015-12-24 LAB — RENAL FUNCTION PANEL
ALBUMIN: 3.1 g/dL — AB (ref 3.5–5.0)
ANION GAP: 18 — AB (ref 5–15)
BUN: 29 mg/dL — ABNORMAL HIGH (ref 6–20)
CALCIUM: 8.5 mg/dL — AB (ref 8.9–10.3)
CO2: 25 mmol/L (ref 22–32)
CREATININE: 7.14 mg/dL — AB (ref 0.61–1.24)
Chloride: 94 mmol/L — ABNORMAL LOW (ref 101–111)
GFR, EST AFRICAN AMERICAN: 9 mL/min — AB (ref >60)
GFR, EST NON AFRICAN AMERICAN: 8 mL/min — AB (ref >60)
Glucose, Bld: 109 mg/dL — ABNORMAL HIGH (ref 65–99)
PHOSPHORUS: 5.8 mg/dL — AB (ref 2.5–4.6)
Potassium: 4.2 mmol/L (ref 3.5–5.1)
SODIUM: 137 mmol/L (ref 135–145)

## 2015-12-24 LAB — TROPONIN I
TROPONIN I: 0.14 ng/mL — AB (ref <0.031)
Troponin I: 0.14 ng/mL — ABNORMAL HIGH (ref <0.031)

## 2015-12-24 LAB — URINE CULTURE: Culture: 1000

## 2015-12-24 LAB — CBC
HCT: 28.1 % — ABNORMAL LOW (ref 39.0–52.0)
HEMOGLOBIN: 8.5 g/dL — AB (ref 13.0–17.0)
MCH: 24.4 pg — AB (ref 26.0–34.0)
MCHC: 30.2 g/dL (ref 30.0–36.0)
MCV: 80.7 fL (ref 78.0–100.0)
PLATELETS: 245 10*3/uL (ref 150–400)
RBC: 3.48 MIL/uL — AB (ref 4.22–5.81)
RDW: 18.5 % — ABNORMAL HIGH (ref 11.5–15.5)
WBC: 9.9 10*3/uL (ref 4.0–10.5)

## 2015-12-24 NOTE — Progress Notes (Signed)
Pt refuses to have bed alarm on, educated patient on safety issues and patient continues to not want bed alarm on. [facility] [employer]

## 2015-12-24 NOTE — Care Management Obs Status (Signed)
[place] NOTIFICATION   Patient Details  Name: [name] [name] MRN: [id number] Date of Birth: [birth year]   Medicare Observation Status Notification Given:  Yes; patient admitted for chest pain    Guido Sander, RN 12/24/2015, 2:00 PM

## 2015-12-24 NOTE — Progress Notes (Signed)
[facility] PROGRESS NOTE   [name] [name] [id number] DOB: 02/07/1956 DOA: 12/23/2015 PCP: [name]  HPI/Subjective: Feels better, denies any chest pain. Did not have appetite this morning  Assessment/Plan: Principal Problem:   Chest pain, atypical Active Problems:   Hypertension   CAD -S/P LAD BMS 2011, LAD DES 2012- patent cors Feb 2016   Diabetes mellitus type 2, diet-controlled ([place])   Diastolic dysfunction-grade 2   GERD (gastroesophageal reflux disease)   Diabetic neuropathy ([place])   ESRD on hemodialysis ([place])   Emesis   Leukocytosis   Chest pain     Chest pain, atypical/CAD -S/P LAD BMS 2011, LAD DES 2012- patent cors Feb 2016 -Atypical chest pain, likely secondary to noncardiogenic pulmonary edema from missing dialysis. -Denies any chest pain this morning. -Slight elevation in flat trend of troponin, not consistent with ACS. -EKG is reassuring and not consistent with ischemic changes. -Evaluated by cardiology and recommended no further ischemic workup evaluation.   ESRD on hemodialysis  -Nephrology aware of admission, chest x-ray showed pattern of mild acute interstitial pulmonary edema. -This is noncardiogenic pulmonary edema secondary to missing dialysis. -Status post removal of 3.5 L last dialysis, per nephrology shortened treatment again for today.   Emesis -Patient reports nonspecific nonbloody emesis for about a week on and off with attempts to ingest food -Abdominal x-ray showed no evidence of SBO, moderate stool throughout the colon. -We'll give sorbitol, hopefully he will have good bowel movements.   Hypertension/Diastolic dysfunction-grade 2 -Current blood pressure somewhat uncontrolled and likely reflective of need for dialysis -Continue preadmission Norvasc and carvedilol   Leukocytosis -No fever no clear source of infection -Check urinalysis and culture -Blood cultures from 3/15 no growth to date -Influenza -3/15    Diabetes mellitus type 2, diet-controlled -Hemoglobin A1c was 6.1. Weight 2017 and patient currently not on medications   GERD  -May also be contributing to patient's chest pain -Hold Protonix initially   Diabetic neuropathy  -Continue Elavil and low-dose Lyrica   Code Status: Full Code Family Communication: Plan discussed with the patient. Disposition Plan: Remains inpatient Diet: Diet Heart Room service appropriate?: Yes; Fluid consistency:: Thin  Consultants:  Nephrology.  Cardiology  Procedures:  None  Antibiotics:  None   Objective: Filed Vitals:   12/24/15 0500 12/24/15 1025  BP: 176/89 116/72  Pulse: 107   Temp: 98.7 F (37.1 C)   Resp: 18     Intake/Output Summary (Last 24 hours) at 12/24/15 1221 Last data filed at 12/24/15 0834  Gross per 24 hour  Intake    240 ml  Output   3550 ml  Net  -3310 ml   Filed Weights   12/23/15 1840 12/23/15 2243 12/24/15 0500  Weight: 93.1 kg (205 lb 4 oz) 90.3 kg (199 lb 1.2 oz) 90.992 kg (200 lb 9.6 oz)    Exam: General: Alert and awake, oriented x3, not in any acute distress. HEENT: anicteric sclera, pupils reactive to light and accommodation, EOMI CVS: S1-S2 clear, no murmur rubs or gallops Chest: clear to auscultation bilaterally, no wheezing, rales or rhonchi Abdomen: soft nontender, nondistended, normal bowel sounds, no organomegaly Extremities: no cyanosis, clubbing or edema noted bilaterally Neuro: Cranial nerves II-XII intact, no focal neurological deficits  Data Reviewed: Basic Metabolic Panel:  Recent Labs Lab 12/21/15 1107 12/21/15 1426 12/23/15 1004 12/24/15 0612  NA 139 140 138 137  K 7.1* 4.9 4.9 4.2  CL 99* 101 95* 94*  CO2 19* 19* 25 25  GLUCOSE 99  88 105* 109*  BUN 69* 68* 54* 29*  CREATININE 13.59* 13.03* 11.47* 7.14*  CALCIUM 8.6* 8.3* 8.1* 8.5*  MG 2.2  --   --   --   PHOS  --   --   --  5.8*   Liver Function Tests:  Recent Labs Lab 12/21/15 1107 12/24/15 0612  AST 15   --   ALT 18  --   ALKPHOS 83  --   BILITOT 0.8  --   PROT 7.5  --   ALBUMIN 3.5 3.1*   No results for input(s): LIPASE, AMYLASE in the last 168 hours. No results for input(s): AMMONIA in the last 168 hours. CBC:  Recent Labs Lab 12/21/15 1107 12/23/15 1004 12/24/15 0607  WBC 10.2 11.1* 9.9  NEUTROABS 8.1*  --   --   HGB 9.8* 8.9* 8.5*  HCT 32.2* 28.5* 28.1*  MCV 81.7 81.7 80.7  PLT 252 231 245   Cardiac Enzymes:  Recent Labs Lab 12/21/15 1107 12/23/15 1837 12/23/15 2316 12/24/15 0607  TROPONINI 0.04* 0.19* 0.14* 0.14*   BNP (last 3 results)  Recent Labs  06/13/15 0304 09/18/15 1909 12/21/15 1107  BNP 672.6* 96.5 1252.2*    ProBNP (last 3 results) No results for input(s): PROBNP in the last 8760 hours.  CBG:  Recent Labs Lab 12/21/15 1206  GLUCAP 93    Micro Recent Results (from the past 240 hour(s))  Culture, blood (routine x 2)     Status: None (Preliminary result)   Collection Time: 12/21/15 11:07 AM  Result Value Ref Range Status   Specimen Description BLOOD RIGHT FOREARM  Final   Special Requests BOTTLES DRAWN AEROBIC ONLY 5CC  Final   Culture NO GROWTH 2 DAYS  Final   Report Status PENDING  Incomplete  Culture, blood (routine x 2)     Status: None (Preliminary result)   Collection Time: 12/21/15 11:42 AM  Result Value Ref Range Status   Specimen Description BLOOD RIGHT FOREARM  Final   Special Requests BOTTLES DRAWN AEROBIC ONLY 5CC  Final   Culture NO GROWTH 2 DAYS  Final   Report Status PENDING  Incomplete  Urine culture     Status: None   Collection Time: 12/23/15  1:32 PM  Result Value Ref Range Status   Specimen Description URINE, CLEAN CATCH  Final   Special Requests NONE  Final   Culture 1,000 COLONIES/mL INSIGNIFICANT GROWTH  Final   Report Status 12/24/2015 FINAL  Final     Studies: Dg Chest 2 View  12/23/2015  CLINICAL DATA:  Chest pain, weakness, nausea and vomiting EXAM: CHEST  2 VIEW COMPARISON:  12/20/2013 FINDINGS:  Similar cardiomegaly with central vascular congestion/ interstitial edema pattern compatible with mild CHF. No focal pneumonia, collapse or consolidation. No significant effusion or pneumothorax. Coronary stents noted. Trachea is midline. Normal bowel gas pattern. IMPRESSION: Minor central interstitial edema pattern.  No significant change. Electronically Signed   By: Jerilynn Mages.  Shick M.D.   On: 12/23/2015 11:17   Dg Abd Portable 1v  12/23/2015  CLINICAL DATA:  Abdominal pain for 3 days.  Emesis. EXAM: PORTABLE ABDOMEN - 1 VIEW COMPARISON:  October 30, 2015 CT abdomen and pelvis FINDINGS: There is moderate stool throughout the colon. There is no bowel dilatation or air-fluid level suggesting obstruction. No free air is seen on this supine examination. There are vascular calcifications in the pelvis. IMPRESSION: Moderate stool throughout colon.  No bowel obstruction or free air. Electronically Signed   By: Gwyndolyn Saxon  Jasmine December III M.D.   On: 12/23/2015 13:48    Scheduled Meds: . amitriptyline  100 mg Oral QHS  . amLODipine  10 mg Oral Daily  . aspirin  81 mg Oral Daily  . atorvastatin  20 mg Oral QHS  . carvedilol  50 mg Oral BID WC  . cinacalcet  90 mg Oral QHS  . clopidogrel  75 mg Oral Daily  . heparin  5,000 Units Subcutaneous 3 times per day  . isosorbide mononitrate  30 mg Oral Daily  . lactulose  20 g Oral BID  . multivitamin  1 tablet Oral QHS  . pregabalin  50 mg Oral Daily  . sevelamer carbonate  2,400 mg Oral TID WC  . sodium chloride flush  3 mL Intravenous Q12H  . venlafaxine XR  75 mg Oral Q breakfast   Continuous Infusions:      Time spent: 35 minutes    [facility]  [facility] Pager [phone] If 7PM-7AM, please contact night-coverage at [email or web address], password [facility] 12/24/2015, 12:21 PM

## 2015-12-24 NOTE — Progress Notes (Signed)
    Subjective:  Denies CP or dyspnea this AM   Objective:  Filed Vitals:   12/23/15 2243 12/24/15 0000 12/24/15 0500 12/24/15 1025  BP: 166/94 152/70 176/89 116/72  Pulse: 99 106 107   Temp: 98.2 F (36.8 C) 100.1 F (37.8 C) 98.7 F (37.1 C)   TempSrc:  Oral Oral   Resp: 18 18 18    Height:      Weight: 199 lb 1.2 oz (90.3 kg)  200 lb 9.6 oz (90.992 kg)   SpO2:  98% 95%     Intake/Output from previous day:  Intake/Output Summary (Last 24 hours) at 12/24/15 1110 Last data filed at 12/24/15 0834  Gross per 24 hour  Intake    240 ml  Output   3550 ml  Net  -3310 ml    Physical Exam: Physical exam: Well-developed well-nourished in no acute distress.  Skin is warm and dry.  HEENT is normal.  Neck is supple. Chest is clear to auscultation with normal expansion.  Cardiovascular exam is regular rate and rhythm.  Abdominal exam nontender or distended. No masses palpated. Extremities show no edema. AV fistula LUE neuro grossly intact    Lab Results: Basic Metabolic Panel:  Recent Labs  12/23/15 1004 12/24/15 0612  NA 138 137  K 4.9 4.2  CL 95* 94*  CO2 25 25  GLUCOSE 105* 109*  BUN 54* 29*  CREATININE 11.47* 7.14*  CALCIUM 8.1* 8.5*  PHOS  --  5.8*   CBC:  Recent Labs  12/23/15 1004 12/24/15 0607  WBC 11.1* 9.9  HGB 8.9* 8.5*  HCT 28.5* 28.1*  MCV 81.7 80.7  PLT 231 245   Cardiac Enzymes:  Recent Labs  12/23/15 1837 12/23/15 2316 12/24/15 0607  TROPONINI 0.19* 0.14* 0.14*     Assessment/Plan:  1 Chest pain-symptoms are extremely atypical. Troponin is minimally elevated but follow-up values are unchanged and there is no clear trend up. I do not think it is consistent with an acute coronary syndrome. No plans for further ischemia evaluation. 2 coronary artery disease-continue aspirin, Plavix and statin. 3 end-stage renal disease-dialysis per nephrology. 4 Hypertension-continue preadmission medications. We will sign off. Patient should  follow-up with Dr. Johnsie Cancel for cardiac issues following DC.  Kirk Ruths 12/24/2015, 11:10 AM

## 2015-12-24 NOTE — Progress Notes (Signed)
ESRD, inadequate dialysis Chest pain, better CAD s/p PTA/DES  Plan HD today, shortened treatment; per Cardiology/IM    (OP HD EDW 89.5kg, 2k,2Ca, Hectorol 4mcg, Hep 8000 units.)  Subjective: Interval History: 3500cc removed with HD yesterday. Chest pressure resolved.  He was too impatient to stay for last OP HD treatment.  Objective: Vital signs in last 24 hours: Temp:  [97.9 F (36.6 [name])-100.1 F (37.8 [name])] 98.7 F (37.1 [name]) (03/18 0500) Pulse Rate:  [88-108] 107 (03/18 0500) Resp:  [18-31] 18 (03/18 0500) BP: (128-176)/(70-112) 176/89 mmHg (03/18 0500) SpO2:  [93 %-100 %] 95 % (03/18 0500) Weight:  [90.3 kg (199 lb 1.2 oz)-95.074 kg (209 lb 9.6 oz)] 90.992 kg (200 lb 9.6 oz) (03/18 0500) Weight change:   Intake/Output from previous day: 03/17 0701 - 03/18 0700 In: 120 [P.O.:120] Out: 3550 [Urine:50] Intake/Output this shift: Total I/O In: 120 [P.O.:120] Out: -   General appearance: alert and cooperative Resp: clear to auscultation bilaterally Chest wall: no tenderness Extremities: extremities normal, atraumatic, no cyanosis or edema  AVF LUE  Lab Results:  Recent Labs  12/23/15 1004 12/24/15 0607  WBC 11.1* 9.9  HGB 8.9* 8.5*  HCT 28.5* 28.1*  PLT 231 245   BMET:  Recent Labs  12/23/15 1004 12/24/15 0612  NA 138 137  K 4.9 4.2  CL 95* 94*  CO2 25 25  GLUCOSE 105* 109*  BUN 54* 29*  CREATININE 11.47* 7.14*  CALCIUM 8.1* 8.5*   No results for input(s): PTH in the last 72 hours. Iron Studies: No results for input(s): IRON, TIBC, TRANSFERRIN, FERRITIN in the last 72 hours. Studies/Results: Dg Chest 2 View  12/23/2015  CLINICAL DATA:  Chest pain, weakness, nausea and vomiting EXAM: CHEST  2 VIEW COMPARISON:  12/20/2013 FINDINGS: Similar cardiomegaly with central vascular congestion/ interstitial edema pattern compatible with mild CHF. No focal pneumonia, collapse or consolidation. No significant effusion or pneumothorax. Coronary stents noted. Trachea  is midline. Normal bowel gas pattern. IMPRESSION: Minor central interstitial edema pattern.  No significant change. Electronically Signed   By: Jerilynn Mages.  Shick M.D.   On: 12/23/2015 11:17   Dg Abd Portable 1v  12/23/2015  CLINICAL DATA:  Abdominal pain for 3 days.  Emesis. EXAM: PORTABLE ABDOMEN - 1 VIEW COMPARISON:  October 30, 2015 CT abdomen and pelvis FINDINGS: There is moderate stool throughout the colon. There is no bowel dilatation or air-fluid level suggesting obstruction. No free air is seen on this supine examination. There are vascular calcifications in the pelvis. IMPRESSION: Moderate stool throughout colon.  No bowel obstruction or free air. Electronically Signed   By: Lowella Grip III M.D.   On: 12/23/2015 13:48    Scheduled: . amitriptyline  100 mg Oral QHS  . amLODipine  10 mg Oral Daily  . aspirin  81 mg Oral Daily  . atorvastatin  20 mg Oral QHS  . carvedilol  50 mg Oral BID WC  . cinacalcet  90 mg Oral QHS  . clopidogrel  75 mg Oral Daily  . heparin  5,000 Units Subcutaneous 3 times per day  . isosorbide mononitrate  30 mg Oral Daily  . lactulose  20 g Oral BID  . multivitamin  1 tablet Oral QHS  . pregabalin  50 mg Oral Daily  . sevelamer carbonate  2,400 mg Oral TID WC  . sodium chloride flush  3 mL Intravenous Q12H  . venlafaxine XR  75 mg Oral Q breakfast      [name] [name]  12/24/2015,9:59 AM

## 2015-12-25 DIAGNOSIS — E0842 Diabetes mellitus due to underlying condition with diabetic polyneuropathy: Secondary | ICD-10-CM

## 2015-12-25 DIAGNOSIS — E119 Type 2 diabetes mellitus without complications: Secondary | ICD-10-CM

## 2015-12-25 DIAGNOSIS — Z992 Dependence on renal dialysis: Secondary | ICD-10-CM

## 2015-12-25 DIAGNOSIS — D72829 Elevated white blood cell count, unspecified: Secondary | ICD-10-CM

## 2015-12-25 DIAGNOSIS — K219 Gastro-esophageal reflux disease without esophagitis: Secondary | ICD-10-CM

## 2015-12-25 DIAGNOSIS — N186 End stage renal disease: Secondary | ICD-10-CM

## 2015-12-25 DIAGNOSIS — R0789 Other chest pain: Principal | ICD-10-CM

## 2015-12-25 DIAGNOSIS — I10 Essential (primary) hypertension: Secondary | ICD-10-CM

## 2015-12-25 LAB — RENAL FUNCTION PANEL
ANION GAP: 18 — AB (ref 5–15)
Albumin: 2.7 g/dL — ABNORMAL LOW (ref 3.5–5.0)
BUN: 41 mg/dL — ABNORMAL HIGH (ref 6–20)
CHLORIDE: 93 mmol/L — AB (ref 101–111)
CO2: 26 mmol/L (ref 22–32)
Calcium: 8.4 mg/dL — ABNORMAL LOW (ref 8.9–10.3)
Creatinine, Ser: 8.75 mg/dL — ABNORMAL HIGH (ref 0.61–1.24)
GFR calc non Af Amer: 6 mL/min — ABNORMAL LOW (ref >60)
GFR, EST AFRICAN AMERICAN: 7 mL/min — AB (ref >60)
Glucose, Bld: 127 mg/dL — ABNORMAL HIGH (ref 65–99)
POTASSIUM: 4.2 mmol/L (ref 3.5–5.1)
Phosphorus: 6.8 mg/dL — ABNORMAL HIGH (ref 2.5–4.6)
Sodium: 137 mmol/L (ref 135–145)

## 2015-12-25 LAB — CBC
HCT: 24.8 % — ABNORMAL LOW (ref 39.0–52.0)
HEMOGLOBIN: 7.9 g/dL — AB (ref 13.0–17.0)
MCH: 25.6 pg — AB (ref 26.0–34.0)
MCHC: 31.9 g/dL (ref 30.0–36.0)
MCV: 80.3 fL (ref 78.0–100.0)
Platelets: 205 10*3/uL (ref 150–400)
RBC: 3.09 MIL/uL — AB (ref 4.22–5.81)
RDW: 18.2 % — ABNORMAL HIGH (ref 11.5–15.5)
WBC: 8.2 10*3/uL (ref 4.0–10.5)

## 2015-12-25 LAB — HEMOGLOBIN AND HEMATOCRIT, BLOOD
HCT: 29 % — ABNORMAL LOW (ref 39.0–52.0)
Hemoglobin: 8.6 g/dL — ABNORMAL LOW (ref 13.0–17.0)

## 2015-12-25 MED ORDER — LIDOCAINE-PRILOCAINE 2.5-2.5 % EX CREA: 1.0000 "application " | TOPICAL_CREAM | CUTANEOUS | Status: DC | PRN

## 2015-12-25 MED ORDER — SODIUM CHLORIDE 0.9 % IV SOLN: 100.0000 mL | INTRAVENOUS | Status: DC | PRN

## 2015-12-25 MED ORDER — LIDOCAINE HCL (PF) 1 % IJ SOLN: 5.0000 mL | INTRAMUSCULAR | Status: DC | PRN

## 2015-12-25 MED ORDER — PENTAFLUOROPROP-TETRAFLUOROETH EX AERO: 1.0000 "application " | INHALATION_SPRAY | CUTANEOUS | Status: DC | PRN

## 2015-12-25 MED ORDER — ALTEPLASE 2 MG IJ SOLR: 2.0000 mg | Freq: Once | INTRAMUSCULAR | Status: DC | PRN

## 2015-12-25 MED ORDER — HEPARIN SODIUM (PORCINE) 1000 UNIT/ML DIALYSIS: 1000.0000 [IU] | INTRAMUSCULAR | Status: DC | PRN

## 2015-12-25 MED ORDER — HEPARIN SODIUM (PORCINE) 1000 UNIT/ML DIALYSIS: 20.0000 [IU]/kg | INTRAMUSCULAR | Status: DC | PRN

## 2015-12-25 NOTE — Progress Notes (Signed)
ESRD, inadequate dialysis  T_T_S Chest pain, better CAD Hx of PTA/DES  Plan Next HD Tuesday  Subjective: Interval History: 2500cc off with HD last PM  Objective: Vital signs in last 24 hours: Temp:  [97.5 F (36.4 [name])-97.8 F (36.6 [name])] 97.5 F (36.4 [name]) (03/19 0425) Pulse Rate:  [78-89] 89 (03/19 0425) Resp:  [16-23] 22 (03/19 0425) BP: (105-138)/(64-82) 134/75 mmHg (03/19 0831) SpO2:  [93 %-96 %] 93 % (03/19 0425) Weight:  [90.5 kg (199 lb 8.3 oz)-92.1 kg (203 lb 0.7 oz)] 90.5 kg (199 lb 8.3 oz) (03/19 0356) Weight change: -2.1 kg (-4 lb 10.1 oz)  Intake/Output from previous day: 03/18 0701 - 03/19 0700 In: 840 [P.O.:840] Out: 2500  Intake/Output this shift:    General appearance: alert, cooperative and hacking cough Resp: clear to auscultation bilaterally Chest wall: no tenderness Cardio: regular rate and rhythm, S1, S2 normal, no murmur, click, rub or gallop  LUE AVF  Lab Results:  Recent Labs  12/24/15 0607 12/25/15 0014  WBC 9.9 8.2  HGB 8.5* 7.9*  HCT 28.1* 24.8*  PLT 245 205   BMET:  Recent Labs  12/24/15 0612 12/25/15 0013  NA 137 137  K 4.2 4.2  CL 94* 93*  CO2 25 26  GLUCOSE 109* 127*  BUN 29* 41*  CREATININE 7.14* 8.75*  CALCIUM 8.5* 8.4*   No results for input(s): PTH in the last 72 hours. Iron Studies: No results for input(s): IRON, TIBC, TRANSFERRIN, FERRITIN in the last 72 hours. Studies/Results: Dg Chest 2 View  12/23/2015  CLINICAL DATA:  Chest pain, weakness, nausea and vomiting EXAM: CHEST  2 VIEW COMPARISON:  12/20/2013 FINDINGS: Similar cardiomegaly with central vascular congestion/ interstitial edema pattern compatible with mild CHF. No focal pneumonia, collapse or consolidation. No significant effusion or pneumothorax. Coronary stents noted. Trachea is midline. Normal bowel gas pattern. IMPRESSION: Minor central interstitial edema pattern.  No significant change. Electronically Signed   By: Jerilynn Mages.  Shick M.D.   On: 12/23/2015 11:17    Dg Abd Portable 1v  12/23/2015  CLINICAL DATA:  Abdominal pain for 3 days.  Emesis. EXAM: PORTABLE ABDOMEN - 1 VIEW COMPARISON:  October 30, 2015 CT abdomen and pelvis FINDINGS: There is moderate stool throughout the colon. There is no bowel dilatation or air-fluid level suggesting obstruction. No free air is seen on this supine examination. There are vascular calcifications in the pelvis. IMPRESSION: Moderate stool throughout colon.  No bowel obstruction or free air. Electronically Signed   By: Lowella Grip III M.D.   On: 12/23/2015 13:48    Scheduled: . amitriptyline  100 mg Oral QHS  . amLODipine  10 mg Oral Daily  . aspirin  81 mg Oral Daily  . atorvastatin  20 mg Oral QHS  . carvedilol  50 mg Oral BID WC  . cinacalcet  90 mg Oral QHS  . clopidogrel  75 mg Oral Daily  . heparin  5,000 Units Subcutaneous 3 times per day  . isosorbide mononitrate  30 mg Oral Daily  . lactulose  20 g Oral BID  . multivitamin  1 tablet Oral QHS  . pregabalin  50 mg Oral Daily  . sevelamer carbonate  2,400 mg Oral TID WC  . sodium chloride flush  3 mL Intravenous Q12H  . venlafaxine XR  75 mg Oral Q breakfast       LOS: 1 day   [name] [name] 12/25/2015,8:59 AM

## 2015-12-25 NOTE — Discharge Summary (Signed)
Physician Discharge Summary  [name] [name] [id number] DOB: [birth year] DOA: 12/23/2015  PCP: [name]  Admit date: 12/23/2015 Discharge date: 12/25/2015  Time spent: 35 minutes  Recommendations for Outpatient Follow-up:  Chest pain, atypical/CAD native artery -S/P LAD BMS 2011, LAD DES 2012- patent cors Feb 2016 -Atypical chest pain, likely secondary to noncardiogenic pulmonary edema from missing dialysis. -Denies any chest pain this morning. -EKG is reassuring and not consistent with ischemic changes. -Evaluated by cardiology and recommended no further ischemic workup evaluation.   ESRD on HD T/Th/Sat, -Nephrology aware of admission, chest x-ray showed pattern of mild acute interstitial pulmonary edema. -This is noncardiogenic pulmonary edema secondary to missing dialysis. -Status post removal of 3.5 L last dialysis, per nephrology shortened treatment again for Saturday. -Cleared for discharge follow-up for HD on regular schedule T/Th/Sat   Emesis -Resolved   Hypertension/Diastolic dysfunction-grade 2 -Current blood pressure now controlled post dialysis on Saturday  -Amlodipine 10 mg daily  -Coreg 50 mg BID -Transfuse for hemoglobin<8  Leukocytosis -Resolved most likely stress demargination  -Blood cultures from 3/15 no growth to date -Influenza -3/15 negative   Diabetes mellitus type 2, diet-controlled -1/21 Hemoglobin A1c= 6.1. -Currently not on medications; diet-controlled   GERD  -Resolved   Diabetic neuropathy  -Amitriptyline 100 mg QHS  -Lyrica 50 mg daily     Discharge Diagnoses:  Principal Problem:   Chest pain, atypical Active Problems:   Hypertension   CAD -S/P LAD BMS 2011, LAD DES 2012- patent cors Feb 2016   Diabetes mellitus type 2, diet-controlled ([facility])   Diastolic dysfunction-grade 2   GERD (gastroesophageal reflux disease)   Diabetic neuropathy ([facility])   ESRD on hemodialysis ([facility])   Emesis   Leukocytosis   Chest pain    Elevated troponin   Discharge Condition: Stable  Diet recommendation: Heart healthy/American diabetic Association  Filed Weights   12/24/15 2358 12/25/15 0300 12/25/15 0356  Weight: 92.1 kg (203 lb 0.7 oz) 90.8 kg (200 lb 2.8 oz) 90.5 kg (199 lb 8.3 oz)    History of present illness:  60 year old BM PMHx Chronic Respiratory Failure on 2 L O2 PRN, DM Type 2 previously on insulin but now diet controlled with neuropathy, HTN, Grade 2 Diastolic Dysfunction, CAD native artery S/P prior stents to the LAD in 2011 and 2012 with patent Coronary Arteries every 2016 catheterization, ESRD on HD T/Th/Sat, GERD, Noncompliance with HD treatments.   He admitted that he tends to cramp while on the dialysis machine and stops treatments early. Patient was last admitted for dialysis only by the nephrology team on Wednesday 3/15. He was discharged after dialysis on the same day. Patient reports a one-week issue of poor appetite and inability to tolerate solids. He reports vomiting after fluids. States last BM was about 3 days ago. Has not had fevers chills or diarrhea. Has not started any new medications or stopped any medications. Adamantly denies use of narcotics or illegal drugs. States takes Lyrica for peripheral neuropathy. He's also complaining of trouble chest pain nonradiating that he describes as occurring "when I have to much fluid on any dialysis". He reported to the attending M.D. that he went to his dialysis session is scheduled yesterday on 3/16 but because he had been dialyzed through the ER on 3/15 the center of same the patient had been admitted and gave his spot away.  ER Evaluation and treatment: Afebrile, BP 149/97, pulse 99 respirations 18, room air saturations 98%; weight 207 pounds EKG: Sinus rhythm with ventricular  rate 99 bpm, QTC 489 ms, no ischemic changes and unchanged from previous EKG 2 view CXR: Mild central interstitial edema pattern without significant change from previous chest  x-rays Laboratory data: Na 138, K 4.9, BUN 54, Cr 11.47, anion gap 18, troponin 0.18, WBCs 11,100; hemoglobin 8.9, platelets 231,000, FOB neg; influenza PCR negative on 3/15; blood cultures from 3/15 no growth to date Normal saline IV bolus 500 mL 1 in the ER During his hospitalization patient was treated for acute pulmonary edema, atypical chest pain brought on by missing dialysis session. Patient received HD 2 sessions and is now back to baseline.    Procedures: NA  Consultations: [facility]y nephrology   Antibiotics NA   Discharge Exam: Filed Vitals:   12/25/15 0300 12/25/15 0356 12/25/15 0425 12/25/15 0831  BP: 130/75  138/79 134/75  Pulse: 86  89   Temp:   97.5 F (36.4 C)   TempSrc:   Oral   Resp: 23  22   Height:      Weight: 90.8 kg (200 lb 2.8 oz) 90.5 kg (199 lb 8.3 oz)    SpO2: 96%  93%     General: A/O 4, NAD, Cardiovascular: Regular rhythm and rate, negative murmurs rubs or gallops, normal S1/S2 Respiratory: Clear to auscultation bilateral  Discharge Instructions     Medication List    STOP taking these medications        losartan 100 MG tablet  Commonly known as:  COZAAR     metoCLOPramide 5 MG tablet  Commonly known as:  REGLAN     UNABLE TO FIND      TAKE these medications        acetaminophen 500 MG tablet  Commonly known as:  TYLENOL  Take 500 mg by mouth every 6 (six) hours as needed for headache.     amitriptyline 100 MG tablet  Commonly known as:  ELAVIL  Take 1 tablet (100 mg total) by mouth at bedtime.     amLODipine 10 MG tablet  Commonly known as:  NORVASC  Take 1 tablet (10 mg total) by mouth daily.     aspirin 81 MG chewable tablet  Chew 1 tablet (81 mg total) by mouth daily.     atorvastatin 20 MG tablet  Commonly known as:  LIPITOR  Take 20 mg by mouth at bedtime.     carvedilol 25 MG tablet  Commonly known as:  COREG  Take 2 tablets (50 mg total) by mouth 2 (two) times daily.     cinacalcet 90 MG  tablet  Commonly known as:  SENSIPAR  Take 90 mg by mouth at bedtime.     clopidogrel 75 MG tablet  Commonly known as:  PLAVIX  Take 1 tablet (75 mg total) by mouth daily.     Darbepoetin Alfa 25 MCG/0.42ML Sosy injection  Commonly known as:  ARANESP  Inject 0.42 mLs (25 mcg total) into the vein every Tuesday with hemodialysis.     doxercalciferol 4 MCG/2ML injection  Commonly known as:  HECTOROL  Inject 1 mL (2 mcg total) into the vein Every Tuesday,Thursday,and Saturday with dialysis.     ferric gluconate 62.5 mg in sodium chloride 0.9 % 100 mL  Inject 62.5 mg into the vein every Thursday with hemodialysis.     isosorbide mononitrate 30 MG 24 hr tablet  Commonly known as:  IMDUR  Take 1 tablet (30 mg total) by mouth daily.     meclizine 25 MG tablet  Commonly known as:  ANTIVERT  Take 25 mg by mouth 3 (three) times daily as needed for dizziness.     multivitamin Tabs tablet  Take 1 tablet by mouth at bedtime.     nitroGLYCERIN 0.4 MG SL tablet  Commonly known as:  NITROSTAT  Place 1 tablet (0.4 mg total) under the tongue every 5 (five) minutes as needed for chest pain.     pantoprazole 40 MG tablet  Commonly known as:  PROTONIX  Take 1 tablet (40 mg total) by mouth daily.     pregabalin 50 MG capsule  Commonly known as:  LYRICA  Take 1 capsule (50 mg total) by mouth daily.     sevelamer carbonate 800 MG tablet  Commonly known as:  RENVELA  Take 2,400 mg by mouth 3 (three) times daily with meals.     simethicone 80 MG chewable tablet  Commonly known as:  GAS-X  Chew 1 tablet (80 mg total) by mouth every 6 (six) hours as needed for flatulence.     venlafaxine XR 75 MG 24 hr capsule  Commonly known as:  EFFEXOR XR  Take 1 capsule (75 mg total) by mouth daily with breakfast.       Allergies  Allergen Reactions  . Enalapril Hives      The results of significant diagnostics from this hospitalization (including imaging, microbiology, ancillary and laboratory)  are listed below for reference.    Significant Diagnostic Studies: Dg Chest 2 View  12/23/2015  CLINICAL DATA:  Chest pain, weakness, nausea and vomiting EXAM: CHEST  2 VIEW COMPARISON:  12/20/2013 FINDINGS: Similar cardiomegaly with central vascular congestion/ interstitial edema pattern compatible with mild CHF. No focal pneumonia, collapse or consolidation. No significant effusion or pneumothorax. Coronary stents noted. Trachea is midline. Normal bowel gas pattern. IMPRESSION: Minor central interstitial edema pattern.  No significant change. Electronically Signed   By: Jerilynn Mages.  Shick M.D.   On: 12/23/2015 11:17   Dg Chest 2 View  12/21/2015  CLINICAL DATA:  Cough for 5 days EXAM: CHEST  2 VIEW COMPARISON:  October 28, 2015 FINDINGS: Mild generalized interstitial edema remains. There is no airspace consolidation. Heart is mildly enlarged with pulmonary venous hypertension. No adenopathy. There is a coronary stent in the left anterior descending coronary artery. No bone lesions evident. IMPRESSION: Evidence a degree of congestive heart failure. No airspace consolidation. Electronically Signed   By: Lowella Grip III M.D.   On: 12/21/2015 10:44   Dg Abd Portable 1v  12/23/2015  CLINICAL DATA:  Abdominal pain for 3 days.  Emesis. EXAM: PORTABLE ABDOMEN - 1 VIEW COMPARISON:  October 30, 2015 CT abdomen and pelvis FINDINGS: There is moderate stool throughout the colon. There is no bowel dilatation or air-fluid level suggesting obstruction. No free air is seen on this supine examination. There are vascular calcifications in the pelvis. IMPRESSION: Moderate stool throughout colon.  No bowel obstruction or free air. Electronically Signed   By: Lowella Grip III M.D.   On: 12/23/2015 13:48    Microbiology: Recent Results (from the past 240 hour(s))  Culture, blood (routine x 2)     Status: None (Preliminary result)   Collection Time: 12/21/15 11:07 AM  Result Value Ref Range Status   Specimen  Description BLOOD RIGHT FOREARM  Final   Special Requests BOTTLES DRAWN AEROBIC ONLY 5CC  Final   Culture NO GROWTH 4 DAYS  Final   Report Status PENDING  Incomplete  Culture, blood (routine x 2)  Status: None (Preliminary result)   Collection Time: 12/21/15 11:42 AM  Result Value Ref Range Status   Specimen Description BLOOD RIGHT FOREARM  Final   Special Requests BOTTLES DRAWN AEROBIC ONLY 5CC  Final   Culture NO GROWTH 4 DAYS  Final   Report Status PENDING  Incomplete  Urine culture     Status: None   Collection Time: 12/23/15  1:32 PM  Result Value Ref Range Status   Specimen Description URINE, CLEAN CATCH  Final   Special Requests NONE  Final   Culture 1,000 COLONIES/mL INSIGNIFICANT GROWTH  Final   Report Status 12/24/2015 FINAL  Final     Labs: Basic Metabolic Panel:  Recent Labs Lab 12/21/15 1107 12/21/15 1426 12/23/15 1004 12/24/15 0612 12/25/15 0013  NA 139 140 138 137 137  K 7.1* 4.9 4.9 4.2 4.2  CL 99* 101 95* 94* 93*  CO2 19* 19* 25 25 26   GLUCOSE 99 88 105* 109* 127*  BUN 69* 68* 54* 29* 41*  CREATININE 13.59* 13.03* 11.47* 7.14* 8.75*  CALCIUM 8.6* 8.3* 8.1* 8.5* 8.4*  MG 2.2  --   --   --   --   PHOS  --   --   --  5.8* 6.8*   Liver Function Tests:  Recent Labs Lab 12/21/15 1107 12/24/15 0612 12/25/15 0013  AST 15  --   --   ALT 18  --   --   ALKPHOS 83  --   --   BILITOT 0.8  --   --   PROT 7.5  --   --   ALBUMIN 3.5 3.1* 2.7*   No results for input(s): LIPASE, AMYLASE in the last 168 hours. No results for input(s): AMMONIA in the last 168 hours. CBC:  Recent Labs Lab 12/21/15 1107 12/23/15 1004 12/24/15 0607 12/25/15 0014 12/25/15 1001  WBC 10.2 11.1* 9.9 8.2  --   NEUTROABS 8.1*  --   --   --   --   HGB 9.8* 8.9* 8.5* 7.9* 8.6*  HCT 32.2* 28.5* 28.1* 24.8* 29.0*  MCV 81.7 81.7 80.7 80.3  --   PLT 252 231 245 205  --    Cardiac Enzymes:  Recent Labs Lab 12/21/15 1107 12/23/15 1837 12/23/15 2316 12/24/15 0607   TROPONINI 0.04* 0.19* 0.14* 0.14*   BNP: BNP (last 3 results)  Recent Labs  06/13/15 0304 09/18/15 1909 12/21/15 1107  BNP 672.6* 96.5 1252.2*    ProBNP (last 3 results) No results for input(s): PROBNP in the last 8760 hours.  CBG:  Recent Labs Lab 12/21/15 1206  GLUCAP 93       Signed:  Dia Crawford, MD [facility] [phone] pager

## 2015-12-25 NOTE — Progress Notes (Signed)
CSW consulted regarding transportation needs. Patient states bus does not run on Sundays and has no money/no one to come pick him up.  CSW provided taxi voucher.  [name] [name] LCSWA [phone]

## 2015-12-25 NOTE — Discharge Instructions (Signed)
Dialysis Diet  Dialysis is a treatment that you may undergo if you have significant damage to your kidneys. Dialysis replaces some of the work that the kidneys do. One of the jobs that it takes over is removing wastes, salt, and extra water from your blood. This helps to keep the amount of potassium and other nutrients in your blood at healthy levels.  When you need dialysis, it is important to pay careful attention to your diet. Between dialysis sessions, certain nutrients can build up in your blood and cause you to get sick. Vitamins and minerals are an important part of a healthy diet and should not be avoided entirely. However, it is commonly recommended that you limit your intake of potassium, phosphorus, and sodium. It may also be necessary to restrict other nutrients, such as carbohydrates or fat, if you have other health conditions. Your health care provider or dietitian can help you to determine the amount of these nutrients that is right for you.  WHAT IS MY PLAN?  Your dietitian will help you to design a meal plan that is specific to your needs. Generally, meal plans include:  · Grains, 6-11 servings per day. One serving is equal to 1 slice of bread or ½ cup of cooked rice or pasta.  · Low-potassium vegetables, 2-3 servings per day. One serving is equal to ½ cup.  · Low-potassium fruits, 2-3 servings per day. One serving is equal to ½ cup.  · Meats and other protein sources, 8-11 oz per day.  · Dairy, ½ cup per day.  Your dietitian will provide you with specific instructions about the amount of fluids you can have each day.  WHAT DO I NEED TO KNOW ABOUT A DIALYSIS DIET?  · Limit your intake of potassium. Potassium is found in milk, fruits, and vegetables.  · Limit your intake of phosphorus. Phosphorus is found in milk, cheese, beans, nuts, and carbonated beverages. Avoid whole-grain and high-fiber foods because they contain high amounts of phosphorus.  · Limit your intake of sodium. Foods that are high in  sodium include processed and cured meats, ready-made frozen meals, canned vegetables, and salty snack foods. Do not use salt substitutes because they contain potassium.  · If you were instructed to restrict your fluid intake, follow your health care provider's specific instructions. You may be told to:    Write down what you drink and any foods you eat that are made mostly from water, such as gelatin and soups.    Drink from small cups to help control how much you drink.  · Ask your health care provider if you should regularly take an over-the-counter medicine that binds phosphorus, such as antacid products that contain calcium carbonate.  · Take vitamin and mineral supplements only as directed by your health care provider.  · Eat high-quality proteins, such as meat, poultry, fish, and eggs. Limit low-quality plant-based proteins, such as nuts and beans.  · Cut potatoes into small pieces and boil them in unsalted water before you eat them. This can help to remove some potassium from the potato.  · Drain all fluid from cooked vegetables and canned fruits before eating them.  WHAT FOODS CAN I EAT?  Grains  White bread. White rice. Cooked cereal. Unsalted popcorn. Tortillas. Pasta.  Vegetables  Fresh or frozen broccoli, carrots, and green beans. Cabbage. Cauliflower. Celery. Cucumbers. Eggplant. Radishes. Zucchini.  Fruits  Apples. Fresh or frozen berries. Fresh or canned pears, peaches, and pineapple. Grapes. Plums.  Meats and   Other Protein Sources  Fresh or frozen beef, pork, chicken, and fish. Eggs. Low-sodium canned tuna or salmon.  Dairy  Cream cheese. Heavy cream. Ricotta cheese.  Beverages  Apple cider. Cranberry juice. Grape juice. Lemonade. Black coffee.  Condiments  Herbs. Spices. Jam and jelly. Honey.  Sweets and Desserts  Sherbet. Cakes. Cookies.  Fats and Oils  Olive oil, canola oil, and safflower oil.  Other  Non-dairy creamer. Non-dairy whipped topping. Homemade broth without salt.  The items listed  above may not be a complete list of recommended foods or beverages. Contact your dietitian for more options.   WHAT FOODS ARE NOT RECOMMENDED?  Grains  Whole-grain bread. Whole-grain pasta. High-fiber cereal.  Vegetables  Potatoes. Beets. Tomatoes. Winter squash and pumpkin. Asparagus. Spinach. Parsnips.  Fruits  Star fruit. Bananas. Oranges. Kiwi. Nectarines. Prunes. Melon. Dried fruit. Avocado.  Meats and Other Protein Sources  Canned, smoked, and cured meats. Packaged luncheon meat. Sardines. Nuts and seeds. Peanut butter. Beans and legumes.  Dairy  Milk. Buttermilk. Yogurt. Cheese and cottage cheese. Processed cheese spreads.  Beverages  Orange juice. Prune juice. Carbonated soft drinks.  Condiments  Salt. Salt substitutes. Soy sauce.  Sweets and Desserts  Ice cream. Chocolate. Candied nuts.  Fats and Oils  Butter. Margarine.  Other  Ready-made frozen meals. Canned soups.  The items listed above may not be a complete list of foods and beverages to avoid. Contact your dietitian for more information.     This information is not intended to replace advice given to you by your health care provider. Make sure you discuss any questions you have with your health care provider.     Document Released: 06/21/2004 Document Revised: 10/15/2014 Document Reviewed: 04/27/2014  [employer] Interactive Patient Education ©2016 [employer]

## 2015-12-26 LAB — CULTURE, BLOOD (ROUTINE X 2)
CULTURE: NO GROWTH
Culture: NO GROWTH

## 2015-12-27 DIAGNOSIS — E119 Type 2 diabetes mellitus without complications: Secondary | ICD-10-CM | POA: Diagnosis not present

## 2015-12-27 DIAGNOSIS — N186 End stage renal disease: Secondary | ICD-10-CM | POA: Diagnosis not present

## 2015-12-27 DIAGNOSIS — D509 Iron deficiency anemia, unspecified: Secondary | ICD-10-CM | POA: Diagnosis not present

## 2015-12-27 DIAGNOSIS — N2581 Secondary hyperparathyroidism of renal origin: Secondary | ICD-10-CM | POA: Diagnosis not present

## 2015-12-27 DIAGNOSIS — D631 Anemia in chronic kidney disease: Secondary | ICD-10-CM | POA: Diagnosis not present

## 2015-12-29 DIAGNOSIS — E119 Type 2 diabetes mellitus without complications: Secondary | ICD-10-CM | POA: Diagnosis not present

## 2015-12-29 DIAGNOSIS — N2581 Secondary hyperparathyroidism of renal origin: Secondary | ICD-10-CM | POA: Diagnosis not present

## 2015-12-29 DIAGNOSIS — D509 Iron deficiency anemia, unspecified: Secondary | ICD-10-CM | POA: Diagnosis not present

## 2015-12-29 DIAGNOSIS — N186 End stage renal disease: Secondary | ICD-10-CM | POA: Diagnosis not present

## 2015-12-29 DIAGNOSIS — D631 Anemia in chronic kidney disease: Secondary | ICD-10-CM | POA: Diagnosis not present

## 2016-01-01 ENCOUNTER — Inpatient Hospital Stay (HOSPITAL_COMMUNITY)
Admission: EM | Admit: 2016-01-01 | Discharge: 2016-01-03 | DRG: 291 | Disposition: A | Discharge status: Home / Self Care | Payer: Medicare Other | Attending: Internal Medicine | Admitting: Internal Medicine

## 2016-01-01 ENCOUNTER — Emergency Department (HOSPITAL_COMMUNITY): Payer: Medicare Other

## 2016-01-01 ENCOUNTER — Encounter (HOSPITAL_COMMUNITY): Payer: Self-pay

## 2016-01-01 DIAGNOSIS — D631 Anemia in chronic kidney disease: Secondary | ICD-10-CM | POA: Diagnosis not present

## 2016-01-01 DIAGNOSIS — R079 Chest pain, unspecified: Secondary | ICD-10-CM | POA: Diagnosis not present

## 2016-01-01 DIAGNOSIS — F329 Major depressive disorder, single episode, unspecified: Secondary | ICD-10-CM | POA: Diagnosis present

## 2016-01-01 DIAGNOSIS — N2581 Secondary hyperparathyroidism of renal origin: Secondary | ICD-10-CM | POA: Diagnosis not present

## 2016-01-01 DIAGNOSIS — I120 Hypertensive chronic kidney disease with stage 5 chronic kidney disease or end stage renal disease: Secondary | ICD-10-CM | POA: Diagnosis not present

## 2016-01-01 DIAGNOSIS — Z955 Presence of coronary angioplasty implant and graft: Secondary | ICD-10-CM

## 2016-01-01 DIAGNOSIS — Z9119 Patient's noncompliance with other medical treatment and regimen: Secondary | ICD-10-CM

## 2016-01-01 DIAGNOSIS — Z8639 Personal history of other endocrine, nutritional and metabolic disease: Secondary | ICD-10-CM

## 2016-01-01 DIAGNOSIS — I132 Hypertensive heart and chronic kidney disease with heart failure and with stage 5 chronic kidney disease, or end stage renal disease: Secondary | ICD-10-CM | POA: Diagnosis not present

## 2016-01-01 DIAGNOSIS — R05 Cough: Secondary | ICD-10-CM | POA: Diagnosis not present

## 2016-01-01 DIAGNOSIS — I248 Other forms of acute ischemic heart disease: Secondary | ICD-10-CM | POA: Diagnosis not present

## 2016-01-01 DIAGNOSIS — E785 Hyperlipidemia, unspecified: Secondary | ICD-10-CM | POA: Diagnosis present

## 2016-01-01 DIAGNOSIS — Z992 Dependence on renal dialysis: Secondary | ICD-10-CM

## 2016-01-01 DIAGNOSIS — I2510 Atherosclerotic heart disease of native coronary artery without angina pectoris: Secondary | ICD-10-CM

## 2016-01-01 DIAGNOSIS — I5189 Other ill-defined heart diseases: Secondary | ICD-10-CM | POA: Diagnosis present

## 2016-01-01 DIAGNOSIS — N186 End stage renal disease: Secondary | ICD-10-CM | POA: Diagnosis not present

## 2016-01-01 DIAGNOSIS — K219 Gastro-esophageal reflux disease without esophagitis: Secondary | ICD-10-CM | POA: Diagnosis present

## 2016-01-01 DIAGNOSIS — I5043 Acute on chronic combined systolic (congestive) and diastolic (congestive) heart failure: Secondary | ICD-10-CM | POA: Diagnosis present

## 2016-01-01 DIAGNOSIS — Z9861 Coronary angioplasty status: Secondary | ICD-10-CM

## 2016-01-01 DIAGNOSIS — E8770 Fluid overload, unspecified: Secondary | ICD-10-CM | POA: Diagnosis not present

## 2016-01-01 DIAGNOSIS — Z87891 Personal history of nicotine dependence: Secondary | ICD-10-CM

## 2016-01-01 DIAGNOSIS — I10 Essential (primary) hypertension: Secondary | ICD-10-CM | POA: Diagnosis present

## 2016-01-01 DIAGNOSIS — Z7982 Long term (current) use of aspirin: Secondary | ICD-10-CM

## 2016-01-01 DIAGNOSIS — Z8611 Personal history of tuberculosis: Secondary | ICD-10-CM

## 2016-01-01 DIAGNOSIS — E1122 Type 2 diabetes mellitus with diabetic chronic kidney disease: Secondary | ICD-10-CM | POA: Diagnosis not present

## 2016-01-01 DIAGNOSIS — E1142 Type 2 diabetes mellitus with diabetic polyneuropathy: Secondary | ICD-10-CM | POA: Diagnosis present

## 2016-01-01 DIAGNOSIS — I5033 Acute on chronic diastolic (congestive) heart failure: Secondary | ICD-10-CM | POA: Diagnosis present

## 2016-01-01 DIAGNOSIS — Z9981 Dependence on supplemental oxygen: Secondary | ICD-10-CM

## 2016-01-01 DIAGNOSIS — Z7902 Long term (current) use of antithrombotics/antiplatelets: Secondary | ICD-10-CM

## 2016-01-01 DIAGNOSIS — D638 Anemia in other chronic diseases classified elsewhere: Secondary | ICD-10-CM | POA: Diagnosis present

## 2016-01-01 LAB — CBC
HEMATOCRIT: 27.9 % — AB (ref 39.0–52.0)
HEMOGLOBIN: 8.8 g/dL — AB (ref 13.0–17.0)
MCH: 24.6 pg — AB (ref 26.0–34.0)
MCHC: 31.5 g/dL (ref 30.0–36.0)
MCV: 78.2 fL (ref 78.0–100.0)
Platelets: 238 10*3/uL (ref 150–400)
RBC: 3.57 MIL/uL — AB (ref 4.22–5.81)
RDW: 18.7 % — ABNORMAL HIGH (ref 11.5–15.5)
WBC: 9.6 10*3/uL (ref 4.0–10.5)

## 2016-01-01 LAB — CBG MONITORING, ED: Glucose-Capillary: 80 mg/dL (ref 65–99)

## 2016-01-01 LAB — BASIC METABOLIC PANEL
Anion gap: 18 — ABNORMAL HIGH (ref 5–15)
BUN: 71 mg/dL — AB (ref 6–20)
CHLORIDE: 95 mmol/L — AB (ref 101–111)
CO2: 23 mmol/L (ref 22–32)
CREATININE: 12.16 mg/dL — AB (ref 0.61–1.24)
Calcium: 9.4 mg/dL (ref 8.9–10.3)
GFR, EST AFRICAN AMERICAN: 5 mL/min — AB (ref >60)
GFR, EST NON AFRICAN AMERICAN: 4 mL/min — AB (ref >60)
Glucose, Bld: 80 mg/dL (ref 65–99)
POTASSIUM: 4.5 mmol/L (ref 3.5–5.1)
SODIUM: 136 mmol/L (ref 135–145)

## 2016-01-01 LAB — I-STAT TROPONIN, ED: TROPONIN I, POC: 0.06 ng/mL (ref 0.00–0.08)

## 2016-01-01 LAB — POC OCCULT BLOOD, ED: FECAL OCCULT BLD: NEGATIVE

## 2016-01-01 LAB — BRAIN NATRIURETIC PEPTIDE: B Natriuretic Peptide: 2463.4 pg/mL — ABNORMAL HIGH (ref 0.0–100.0)

## 2016-01-01 MED ORDER — HYDROCODONE-ACETAMINOPHEN 5-325 MG PO TABS
1.0000 | ORAL_TABLET | Freq: Once | ORAL | Status: AC
  Administered 2016-01-01: 1 via ORAL
  Filled 2016-01-01: qty 1

## 2016-01-01 MED ORDER — MORPHINE SULFATE (PF) 4 MG/ML IV SOLN
4.0000 mg | Freq: Once | INTRAVENOUS | Status: AC
  Administered 2016-01-01: 4 mg via INTRAVENOUS
  Filled 2016-01-01: qty 1

## 2016-01-01 MED ORDER — AMLODIPINE BESYLATE 10 MG PO TABS
10.0000 mg | ORAL_TABLET | Freq: Every day | ORAL | Status: DC
  Administered 2016-01-01 – 2016-01-03 (×3): 10 mg via ORAL
  Filled 2016-01-01 (×4): qty 1

## 2016-01-01 MED ORDER — ASPIRIN 81 MG PO CHEW
324.0000 mg | CHEWABLE_TABLET | Freq: Once | ORAL | Status: AC
  Administered 2016-01-01: 324 mg via ORAL
  Filled 2016-01-01: qty 4

## 2016-01-01 MED ORDER — LEVALBUTEROL HCL 1.25 MG/0.5ML IN NEBU
1.2500 mg | INHALATION_SOLUTION | Freq: Four times a day (QID) | RESPIRATORY_TRACT | Status: DC
Start: 2016-01-01 — End: 2016-01-02
  Filled 2016-01-01: qty 0.5

## 2016-01-01 MED ORDER — LEVALBUTEROL HCL 1.25 MG/0.5ML IN NEBU: 1.2500 mg | INHALATION_SOLUTION | Freq: Four times a day (QID) | RESPIRATORY_TRACT | Status: DC

## 2016-01-01 MED ORDER — IPRATROPIUM BROMIDE 0.02 % IN SOLN
0.5000 mg | Freq: Four times a day (QID) | RESPIRATORY_TRACT | Status: DC
  Administered 2016-01-01: 0.5 mg via RESPIRATORY_TRACT
  Filled 2016-01-01: qty 2.5

## 2016-01-01 NOTE — ED Provider Notes (Signed)
CSN: [id number]     Arrival date & time 01/01/16  1256 History   None    Chief Complaint  Patient presents with  . Cough  . Chest Pain     (Consider location/radiation/quality/duration/timing/severity/associated sxs/prior Treatment) HPI   Blood pressure 190/111, pulse 112, temperature 98.1 F (36.7 C), temperature source Oral, resp. rate 24, SpO2 95 %.  [name] [name] is a 60 y.o. male with PMH of HTN, internal hemorrhoids, CAD with stents (Cards at [facility], s/p 3x stents), DMT2 (diet controlled), ESRD on HD (last fully dialyzed 4 days ago) c/o Dry cough, retrosternal chest pain exacerbated by cough and position onset 1 week ago. 8 out of 10 at worse, described as sharp, not exacerbated by exertion. Patient denies fever, endorses increasing peripheral edema. States low-dose aspirin which he had this morning. Patient also reports that he melanotic stool and single episode of nonbloody, nonbilious, coffee-ground emesis. Patient is not anticoagulated.   Past Medical History  Diagnosis Date  . Hypertension   . Hematochezia     a. 2014: colonscopy, which showed moderately-sized internal hemorrhoids, two 106mm polyps in transverse colon and ascending colon that were resected, five 2-63mm polyps in sigmoid colon, descending colon, transverse colon, and ascending colon that were resected. An upper endoscopy was performed and showed normal esophagus, stomach, and duodenum.  . Hematuria     a. H/o hematuria 2014 with cystoscopy that was unrevealing for his source of hematuria. He underwent a kidney ultrasound on 10/14 that showed mildly echogenic and scarred kidneys compatible with medical renal disease, without hydronephrosis or renal calculi.  [name] Anemia   . CAD (coronary artery disease)     a. per [facility] s/p 3.51mm x 17mm Vision BMS to mid LAD 12/2009 and Xience DES to mid LAD 10/2010.  . Colon polyps   . Chronic diastolic CHF (congestive heart failure) ([place])   . Hyperlipidemia   .  Anginal pain ([place])   . Heart murmur   . 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 Type II diabetes mellitus ([place])   . History of blood transfusion     "had colonoscopy done; they had to give me some blood"  . Daily headache   . ESRD on dialysis [facility]) since ~ 2008    "[place]; TTS" (07/21/2015)  . On home oxygen therapy     "2L prn" (07/21/2015)  . Renal insufficiency    Past Surgical History  Procedure Laterality Date  . Left heart catheterization with coronary angiogram N/A 11/23/2014    Procedure: LEFT HEART CATHETERIZATION WITH CORONARY ANGIOGRAM;  Surgeon: Troy Sine, MD;  Location: [facility] CATH LAB;  Service: Cardiovascular;  Laterality: N/A;  . Lithotripsy  X1  . Cystoscopy w/ stone manipulation  X2?  . Cardiac catheterization  "several"  . Coronary angioplasty with stent placement  "several"  . Eye surgery Bilateral     "laser OR for hemorrhage"  . Av fistula placement Left ~ 2007    "upper arm"   Family History  Problem Relation Age of Onset  . Hypertension    . Bone cancer Mother   . Anuerysm Father   . Diabetes type II Daughter    Social History  Substance Use Topics  . Smoking status: Former Smoker -- 0.50 packs/day for 8 years    Types: Cigarettes    Quit date: 12/06/2010  . Smokeless tobacco: Never Used  . Alcohol Use: No  Review of Systems  10 systems reviewed and found to be negative, except as noted in the HPI.  Allergies  Enalapril  Home Medications   Prior to Admission medications   Medication Sig Start Date End Date Taking? Authorizing Provider  acetaminophen (TYLENOL) 500 MG tablet Take 500 mg by mouth every 6 (six) hours as needed for headache.    Yes Historical Provider, MD  amitriptyline (ELAVIL) 100 MG tablet Take 1 tablet (100 mg total) by mouth at bedtime. 07/27/15  Yes Shela Leff, MD  amLODipine (NORVASC) 10 MG tablet Take 1 tablet (10 mg total) by mouth daily. 10/30/15  Yes Ripudeep Krystal Eaton, MD   aspirin 81 MG chewable tablet Chew 1 tablet (81 mg total) by mouth daily. 07/22/15  Yes Iline Oven, MD  atorvastatin (LIPITOR) 20 MG tablet Take 20 mg by mouth at bedtime.    Yes Historical Provider, MD  carvedilol (COREG) 25 MG tablet Take 2 tablets (50 mg total) by mouth 2 (two) times daily. 10/30/15  Yes Ripudeep Krystal Eaton, MD  cinacalcet (SENSIPAR) 90 MG tablet Take 90 mg by mouth at bedtime.    Yes Historical Provider, MD  clopidogrel (PLAVIX) 75 MG tablet Take 1 tablet (75 mg total) by mouth daily. 11/24/14  Yes Juluis Mire, MD  Darbepoetin Alfa (ARANESP) 25 MCG/0.42ML SOSY injection Inject 0.42 mLs (25 mcg total) into the vein every Tuesday with hemodialysis. 12/15/14  Yes Geradine Girt, DO  doxercalciferol (HECTOROL) 4 MCG/2ML injection Inject 1 mL (2 mcg total) into the vein Every Tuesday,Thursday,and Saturday with dialysis. 12/15/14  Yes Geradine Girt, DO  ferric gluconate 62.5 mg in sodium chloride 0.9 % 100 mL Inject 62.5 mg into the vein every Thursday with hemodialysis. 12/16/14  Yes Geradine Girt, DO  isosorbide mononitrate (IMDUR) 30 MG 24 hr tablet Take 1 tablet (30 mg total) by mouth daily. 10/30/15  Yes Ripudeep Krystal Eaton, MD  meclizine (ANTIVERT) 25 MG tablet Take 25 mg by mouth 3 (three) times daily as needed for dizziness.   Yes Historical Provider, MD  nitroGLYCERIN (NITROSTAT) 0.4 MG SL tablet Place 1 tablet (0.4 mg total) under the tongue every 5 (five) minutes as needed for chest pain. 06/05/15  Yes Janece Canterbury, MD  pantoprazole (PROTONIX) 40 MG tablet Take 1 tablet (40 mg total) by mouth daily. Patient taking differently: Take 40 mg by mouth daily as needed (for acid reflux).  06/05/15  Yes Janece Canterbury, MD  pregabalin (LYRICA) 50 MG capsule Take 1 capsule (50 mg total) by mouth daily. 12/15/14  Yes Geradine Girt, DO  sevelamer carbonate (RENVELA) 800 MG tablet Take 2,400 mg by mouth 3 (three) times daily with meals.    Yes Historical Provider, MD  simethicone (GAS-X) 80  MG chewable tablet Chew 1 tablet (80 mg total) by mouth every 6 (six) hours as needed for flatulence. 08/29/15  Yes Collier Salina, MD  venlafaxine XR (EFFEXOR XR) 75 MG 24 hr capsule Take 1 capsule (75 mg total) by mouth daily with breakfast. 07/27/15  Yes Shela Leff, MD  multivitamin (RENA-VIT) TABS tablet Take 1 tablet by mouth at bedtime. Patient not taking: Reported on 01/01/2016 12/15/14   Tomi Bamberger Vann, DO   BP 184/115 mmHg  Pulse 113  Temp(Src) 98.1 F (36.7 C) (Oral)  Resp 26  SpO2 95% Physical Exam  Constitutional: He is oriented to person, place, and time. He appears well-developed and well-nourished. No distress.  HENT:  Head: Normocephalic.  Mouth/Throat: Oropharynx  is clear and moist.  Eyes: Conjunctivae and EOM are normal.  Neck: Normal range of motion. No JVD present. No tracheal deviation present.  Cardiovascular: Normal rate, regular rhythm and intact distal pulses.   Tachycardia  Left AC fistula with good thrill  Pulmonary/Chest: Effort normal and breath sounds normal. No stridor. No respiratory distress. He has no wheezes. He has no rales. He exhibits no tenderness.  Abdominal: Soft. He exhibits no distension and no mass. There is no tenderness. There is no rebound and no guarding.  Musculoskeletal: Normal range of motion. He exhibits edema. He exhibits no tenderness.  No calf asymmetry, superficial collaterals, palpable cords, edema, Homans sign negative bilaterally.    Neurological: He is alert and oriented to person, place, and time.  Skin: Skin is warm. He is not diaphoretic.  Psychiatric: He has a normal mood and affect.  Nursing note and vitals reviewed.   ED Course  Procedures (including critical care time) Labs Review Labs Reviewed  CBC - Abnormal; Notable for the following:    RBC 3.57 (*)    Hemoglobin 8.8 (*)    HCT 27.9 (*)    MCH 24.6 (*)    RDW 18.7 (*)    All other components within normal limits  BRAIN NATRIURETIC PEPTIDE -  Abnormal; Notable for the following:    B Natriuretic Peptide 2463.4 (*)    All other components within normal limits  BASIC METABOLIC PANEL - Abnormal; Notable for the following:    Chloride 95 (*)    BUN 71 (*)    Creatinine, Ser 12.16 (*)    GFR calc non Af Amer 4 (*)    GFR calc Af Amer 5 (*)    Anion gap 18 (*)    All other components within normal limits  I-STAT TROPOININ, ED  POC OCCULT BLOOD, ED    Imaging Review Dg Chest 2 View  01/01/2016  CLINICAL DATA:  Mid chest pain and productive cough for 2 weeks. Initial encounter. EXAM: CHEST  2 VIEW COMPARISON:  PA and lateral chest and CT chest 09/18/2015. PA and lateral chest 12/21/2015 and 12/23/2015. FINDINGS: Heart size is upper normal. There is indistinctness of the pulmonary vasculature bilaterally which has worsened since the prior study. Somewhat more focal opacity is present in the right lung base. No pneumothorax or pleural effusion. No focal bony abnormality. IMPRESSION: Appearance of the chest most suggestive of congestive failure. More focal opacity in the right base could be due to more intense edema, atelectasis or less likely pneumonia. Electronically Signed   By: Inge Rise M.D.   On: 01/01/2016 14:19   I have personally reviewed and evaluated these images and lab results as part of my medical decision-making.   EKG Interpretation None      MDM   Final diagnoses:  Hypervolemia, unspecified hypervolemia type  ESRD needing dialysis ([place])    Filed Vitals:   01/01/16 1630 01/01/16 1700 01/01/16 1930 01/01/16 2000  BP: 184/107 178/119 190/111 184/115  Pulse: 110 109 114 113  Temp:      TempSrc:      Resp: 22  30 26   SpO2: 97% 96% 97% 95%    Medications  amLODipine (NORVASC) tablet 10 mg (not administered)  aspirin chewable tablet 324 mg (324 mg Oral Given 01/01/16 1535)  HYDROcodone-acetaminophen (NORCO/VICODIN) 5-325 MG per tablet 1 tablet (1 tablet Oral Given 01/01/16 1747)    Ebrahim Belman is 60  y.o. male presenting with Chest pain, atypical in nature. Chart  review shows that this patient has atypical chest pain when he misses dialysis, he missed dialysis 2 days ago. Lung sounds are clear to auscultation, there is no peripheral edema he is not grossly volume overloaded however, chest x-rays consistent with mild CHF, his proBNP is elevated at 2500. Potassium is normal. This patient will need dialysis non-emergently.  Case d/w Dr. Melvia Heaps who has evaluated the x-ray and agrees is likely early heart failure. Since patient is not hypoxic states he probably does not need to be dialyzed tonight, after the patient be transferred from [place] to [facility], will be dialyzed in the a.m.   Monico Blitz, PA-C 01/01/16 2030  Leonard Schwartz, MD 01/04/16 [id number]

## 2016-01-01 NOTE — ED Notes (Signed)
PA and MD made aware of continued BP elevation

## 2016-01-01 NOTE — ED Notes (Signed)
Hospitalist at bedside 

## 2016-01-01 NOTE — Consult Note (Addendum)
Renal Service Consult Note [name]  Jacari Helmandollar 01/01/2016 [place] Requesting Physician:  Dr Olevia Bowens  Reason for Consult:  ESRD patient w SOB and cough HPI: The patient is a 60 y.o. year-old with hx of CAD w stents, diast HF, angina, HL, DM2 , HTN and ESRD on HD TTS at [facility]. On HD over 8 years.  He says he missed yesterday's dialysis and presents today to ER with complaints of bad cough for two weeks and chest pain.  Chest pain mostly associated with coughing.  Nonprod cough, has been having some fevers as well, runny nose and nasal congestion.  His daughter was also sick recently.  He has not been able to renew his BP medications because he has been too sick and he was going to ask his daughter to do it for him but then she got sick.    In ED BP high, CXR w early CHF picture. Asked to see for HD.    Pt denies severe SOB at rest or orthopnea.  No abd pain, no diarrhea, did vomit this am.  No high fevers, arthralgia, HA, or sore throat.      ROS  no joint pain   no HA  no blurry vision  no rash  no diarrhea  no dysuria  no difficulty voiding  no change in urine color    Past Medical History  Past Medical History  Diagnosis Date  . Hypertension   . Hematochezia     a. 2014: colonscopy, which showed moderately-sized internal hemorrhoids, two 58mm polyps in transverse colon and ascending colon that were resected, five 2-58mm polyps in sigmoid colon, descending colon, transverse colon, and ascending colon that were resected. An upper endoscopy was performed and showed normal esophagus, stomach, and duodenum.  . Hematuria     a. H/o hematuria 2014 with cystoscopy that was unrevealing for his source of hematuria. He underwent a kidney ultrasound on 10/14 that showed mildly echogenic and scarred kidneys compatible with medical renal disease, without hydronephrosis or renal calculi.  [name] Anemia   . CAD (coronary artery disease)     a. per [facility] s/p  3.36mm x 48mm Vision BMS to mid LAD 12/2009 and Xience DES to mid LAD 10/2010.  . Colon polyps   . Chronic diastolic CHF (congestive heart failure) ([place])   . Hyperlipidemia   . Anginal pain ([place])   . Heart murmur   . 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 Type II diabetes mellitus ([place])   . History of blood transfusion     "had colonoscopy done; they had to give me some blood"  . Daily headache   . ESRD on dialysis [facility]) since ~ 2008    "[place]; TTS" (07/21/2015)  . On home oxygen therapy     "2L prn" (07/21/2015)  . Renal insufficiency    Past Surgical History  Past Surgical History  Procedure Laterality Date  . Left heart catheterization with coronary angiogram N/A 11/23/2014    Procedure: LEFT HEART CATHETERIZATION WITH CORONARY ANGIOGRAM;  Surgeon: Troy Sine, MD;  Location: [facility] CATH LAB;  Service: Cardiovascular;  Laterality: N/A;  . Lithotripsy  X1  . Cystoscopy w/ stone manipulation  X2?  . Cardiac catheterization  "several"  . Coronary angioplasty with stent placement  "several"  . Eye surgery Bilateral     "laser OR for hemorrhage"  . Av fistula placement Left ~ 2007    "  upper arm"   Family History  Family History  Problem Relation Age of Onset  . Hypertension    . Bone cancer Mother   . Anuerysm Father   . Diabetes type II Daughter    Social History  reports that he quit smoking about 5 years ago. His smoking use included Cigarettes. He has a 4 pack-year smoking history. He has never used smokeless tobacco. He reports that he does not drink alcohol or use illicit drugs. Allergies  Allergies  Allergen Reactions  . Enalapril Hives   Home medications Prior to Admission medications   Medication Sig Start Date End Date Taking? Authorizing Provider  acetaminophen (TYLENOL) 500 MG tablet Take 500 mg by mouth every 6 (six) hours as needed for headache.    Yes Historical Provider, MD  amitriptyline (ELAVIL) 100 MG tablet Take 1 tablet  (100 mg total) by mouth at bedtime. 07/27/15  Yes Shela Leff, MD  amLODipine (NORVASC) 10 MG tablet Take 1 tablet (10 mg total) by mouth daily. 10/30/15  Yes Ripudeep Krystal Eaton, MD  aspirin 81 MG chewable tablet Chew 1 tablet (81 mg total) by mouth daily. 07/22/15  Yes Iline Oven, MD  atorvastatin (LIPITOR) 20 MG tablet Take 20 mg by mouth at bedtime.    Yes Historical Provider, MD  carvedilol (COREG) 25 MG tablet Take 2 tablets (50 mg total) by mouth 2 (two) times daily. 10/30/15  Yes Ripudeep Krystal Eaton, MD  cinacalcet (SENSIPAR) 90 MG tablet Take 90 mg by mouth at bedtime.    Yes Historical Provider, MD  clopidogrel (PLAVIX) 75 MG tablet Take 1 tablet (75 mg total) by mouth daily. 11/24/14  Yes Juluis Mire, MD  Darbepoetin Alfa (ARANESP) 25 MCG/0.42ML SOSY injection Inject 0.42 mLs (25 mcg total) into the vein every Tuesday with hemodialysis. 12/15/14  Yes Geradine Girt, DO  doxercalciferol (HECTOROL) 4 MCG/2ML injection Inject 1 mL (2 mcg total) into the vein Every Tuesday,Thursday,and Saturday with dialysis. 12/15/14  Yes Geradine Girt, DO  ferric gluconate 62.5 mg in sodium chloride 0.9 % 100 mL Inject 62.5 mg into the vein every Thursday with hemodialysis. 12/16/14  Yes Geradine Girt, DO  isosorbide mononitrate (IMDUR) 30 MG 24 hr tablet Take 1 tablet (30 mg total) by mouth daily. 10/30/15  Yes Ripudeep Krystal Eaton, MD  meclizine (ANTIVERT) 25 MG tablet Take 25 mg by mouth 3 (three) times daily as needed for dizziness.   Yes Historical Provider, MD  nitroGLYCERIN (NITROSTAT) 0.4 MG SL tablet Place 1 tablet (0.4 mg total) under the tongue every 5 (five) minutes as needed for chest pain. 06/05/15  Yes Janece Canterbury, MD  pantoprazole (PROTONIX) 40 MG tablet Take 1 tablet (40 mg total) by mouth daily. Patient taking differently: Take 40 mg by mouth daily as needed (for acid reflux).  06/05/15  Yes Janece Canterbury, MD  pregabalin (LYRICA) 50 MG capsule Take 1 capsule (50 mg total) by mouth daily. 12/15/14   Yes Geradine Girt, DO  sevelamer carbonate (RENVELA) 800 MG tablet Take 2,400 mg by mouth 3 (three) times daily with meals.    Yes Historical Provider, MD  simethicone (GAS-X) 80 MG chewable tablet Chew 1 tablet (80 mg total) by mouth every 6 (six) hours as needed for flatulence. 08/29/15  Yes Collier Salina, MD  venlafaxine XR (EFFEXOR XR) 75 MG 24 hr capsule Take 1 capsule (75 mg total) by mouth daily with breakfast. 07/27/15  Yes Shela Leff, MD  multivitamin (RENA-VIT) TABS  tablet Take 1 tablet by mouth at bedtime. Patient not taking: Reported on 01/01/2016 12/15/14   Geradine Girt, DO   Liver Function Tests No results for input(s): AST, ALT, ALKPHOS, BILITOT, PROT, ALBUMIN in the last 168 hours. No results for input(s): LIPASE, AMYLASE in the last 168 hours. CBC  Recent Labs Lab 01/01/16 1836  WBC 9.6  HGB 8.8*  HCT 27.9*  MCV 78.2  PLT 99991111   Basic Metabolic Panel  Recent Labs Lab 01/01/16 1600  NA 136  K 4.5  CL 95*  CO2 23  GLUCOSE 80  BUN 71*  CREATININE 12.16*  CALCIUM 9.4    Filed Vitals:   01/01/16 2000 01/01/16 2030 01/01/16 2135 01/01/16 2200  BP: 184/115 164/112 183/124 194/127  Pulse: 113     Temp:      TempSrc:      Resp: 26 25  29   SpO2: 95%      Exam No distress, BP 190/120   HR 109 R 24  Temp 98.1   No rash, cyanosis or gangrene Sclera anicteric, throat clear No jvd or bruits Chest clear bilat RRR no MRG Abd soft ntnd no mass or ascites +bs GU normal male    MS no joint effusions or deformity Ext no LE or UE edema / no wounds or ulcers Neuro is alert, Ox 3 , nf LUA AVF aneurysmal +bruit     Dialysis: [facility]   4h  500800  89.5kg   LUA AVF  Hep 8000   Assessment: 1. Chest pain - poss URI/ bronchitis/ viral, w/u per primary 2. SOB/ cough - has early CHF on CXR. Missed HD Sat 3. ESRD usual HD TTS 4. HTN on norvasc/ coreg/ imdur, but sounds like he ran out of medication recently w URI illness 5. Depression/  neuropathy - effexor, lyrica, elavil qhs 6. CAD hx stents - prn NTG, BB, statin, asa/ plavix 7. Volume - +vol 8. MBD renvela/ sensipar, get OP records in am 9. Anemia Hb 8.8, get records   Plan - HD first shift in am Monday, then back on sched w HD Tuesday  Rob Harrison MD [place] pager [phone]    cell [phone] 01/01/2016, 10:46 PM

## 2016-01-01 NOTE — ED Notes (Signed)
[facility] arrived  

## 2016-01-01 NOTE — ED Notes (Signed)
[facility] called. No ETA available per operator.

## 2016-01-01 NOTE — ED Notes (Signed)
Provider at bedside

## 2016-01-01 NOTE — ED Notes (Signed)
Attempted to call report at [phone], floor stated they will call back when nurse is available.

## 2016-01-01 NOTE — ED Notes (Signed)
He c/o cough plus chest pain and some shortness of breath x 1 week.  His skin is normal, warm and dry and he is moderately short of breath.  EKG performed at triage.

## 2016-01-01 NOTE — H&P (Signed)
[facility] History and Physical  [name] [name] [id number] DOB: [birth year] DOA: 01/01/2016  Referring physician: Monico Blitz, PA-C PCP: [name]   Chief Complaint: Cough and chest pain.  HPI: [name] [name] is a 60 y.o. 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 He denies fever, chills, but complains of fatigue. He missed his last hemodialysis session.   Patient also stated that he had a black color stool this morning and one episode of coffee-ground emesis. However, stool guaiac was negative and the patient's H&H is stable. He denies abdominal pain, diarrhea, constipation or hematochezia. He is states that he had a low dose aspirin this morning and his taking Plavix on a regular basis.  When seen in the emergency department, the patient was in no acute distress. Workup shows stable anemia, a BNP level which has doubled since 10 days ago and a chest radiograph showing CHF pattern.    Review of Systems:  Constitutional: Positive fatigue.  No weight loss, night sweats, Fevers, chills HEENT:  No headaches, Difficulty swallowing,Tooth/dental problems,Sore throat,  No sneezing, itching, ear ache, nasal congestion, post nasal drip,  Cardio-vascular:   Positive swelling in lower extremities, Orthopnea. No PND, anasarca, dizziness, palpitations  GI:  As above mentioned.  Resp:  Positive pleuritic chest pain, dyspnea, nonproductive cough. No hemoptysis. Skin:  no rash or lesions.  GU:  no dysuria, change in color of urine, no urgency or frequency. No flank pain.  Musculoskeletal:  No joint pain or swelling. No decreased range of motion. No back pain.  Psych:  No change in  mood or affect. No depression or anxiety. No memory loss.   Past Medical History  Diagnosis Date  . Hypertension   . Hematochezia     a. 2014: colonscopy, which showed moderately-sized internal hemorrhoids, two 49mm polyps in transverse colon and ascending colon that were resected, five 2-93mm polyps in sigmoid colon, descending colon, transverse colon, and ascending colon that were resected. An upper endoscopy was performed and showed normal esophagus, stomach, and duodenum.  . Hematuria     a. H/o hematuria 2014 with cystoscopy that was unrevealing for his source of hematuria. He underwent a kidney ultrasound on 10/14 that showed mildly echogenic and scarred kidneys compatible with medical renal disease, without hydronephrosis or renal calculi.  [name] Anemia   . CAD (coronary artery disease)     a. per [facility] s/p 3.71mm x 66mm [facility] BMS to mid LAD 12/2009 and Xience DES to mid LAD 10/2010.  . Colon polyps   . Chronic diastolic CHF (congestive heart failure) ([place])   . Hyperlipidemia   . Anginal pain ([place])   . Heart murmur   . 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 Type II diabetes mellitus ([place])   . History of blood transfusion     "had colonoscopy done; they had to give me some blood"  . Daily headache   . ESRD on dialysis [facility]) since ~ 2008    "[place]; TTS" (07/21/2015)  . On home oxygen therapy     "2L prn" (07/21/2015)  . Renal insufficiency    Past Surgical History  Procedure Laterality Date  . Left heart catheterization  with coronary angiogram N/A 11/23/2014    Procedure: LEFT HEART CATHETERIZATION WITH CORONARY ANGIOGRAM;  Surgeon: Troy Sine, MD;  Location[facility] CATH LAB;  Service: Cardiovascular;  Laterality: N/A;  . Lithotripsy  X1  . Cystoscopy w/ stone manipulation  X2?  . Cardiac catheterization  "several"  . Coronary angioplasty with stent placement  "several"  . Eye surgery Bilateral     "laser OR for hemorrhage"  . Av fistula  placement Left ~ 2007    "upper arm"   Social History:  reports that he quit smoking about 5 years ago. His smoking use included Cigarettes. He has a 4 pack-year smoking history. He has never used smokeless tobacco. He reports that he does not drink alcohol or use illicit drugs.  Allergies  Allergen Reactions  . Enalapril Hives    Family History  Problem Relation Age of Onset  . Hypertension    . Bone cancer Mother   . Anuerysm Father   . Diabetes type II Daughter     Prior to Admission medications   Medication Sig Start Date End Date Taking? Authorizing Provider  acetaminophen (TYLENOL) 500 MG tablet Take 500 mg by mouth every 6 (six) hours as needed for headache.    Yes Historical Provider, MD  amitriptyline (ELAVIL) 100 MG tablet Take 1 tablet (100 mg total) by mouth at bedtime. 07/27/15  Yes Shela Leff, MD  amLODipine (NORVASC) 10 MG tablet Take 1 tablet (10 mg total) by mouth daily. 10/30/15  Yes Ripudeep Krystal Eaton, MD  aspirin 81 MG chewable tablet Chew 1 tablet (81 mg total) by mouth daily. 07/22/15  Yes Iline Oven, MD  atorvastatin (LIPITOR) 20 MG tablet Take 20 mg by mouth at bedtime.    Yes Historical Provider, MD  carvedilol (COREG) 25 MG tablet Take 2 tablets (50 mg total) by mouth 2 (two) times daily. 10/30/15  Yes Ripudeep Krystal Eaton, MD  cinacalcet (SENSIPAR) 90 MG tablet Take 90 mg by mouth at bedtime.    Yes Historical Provider, MD  clopidogrel (PLAVIX) 75 MG tablet Take 1 tablet (75 mg total) by mouth daily. 11/24/14  Yes Juluis Mire, MD  Darbepoetin Alfa (ARANESP) 25 MCG/0.42ML SOSY injection Inject 0.42 mLs (25 mcg total) into the vein every Tuesday with hemodialysis. 12/15/14  Yes Geradine Girt, DO  doxercalciferol (HECTOROL) 4 MCG/2ML injection Inject 1 mL (2 mcg total) into the vein Every Tuesday,Thursday,and Saturday with dialysis. 12/15/14  Yes Geradine Girt, DO  ferric gluconate 62.5 mg in sodium chloride 0.9 % 100 mL Inject 62.5 mg into the vein every  Thursday with hemodialysis. 12/16/14  Yes Geradine Girt, DO  isosorbide mononitrate (IMDUR) 30 MG 24 hr tablet Take 1 tablet (30 mg total) by mouth daily. 10/30/15  Yes Ripudeep Krystal Eaton, MD  meclizine (ANTIVERT) 25 MG tablet Take 25 mg by mouth 3 (three) times daily as needed for dizziness.   Yes Historical Provider, MD  nitroGLYCERIN (NITROSTAT) 0.4 MG SL tablet Place 1 tablet (0.4 mg total) under the tongue every 5 (five) minutes as needed for chest pain. 06/05/15  Yes Janece Canterbury, MD  pantoprazole (PROTONIX) 40 MG tablet Take 1 tablet (40 mg total) by mouth daily. Patient taking differently: Take 40 mg by mouth daily as needed (for acid reflux).  06/05/15  Yes Janece Canterbury, MD  pregabalin (LYRICA) 50 MG capsule Take 1 capsule (50 mg total) by mouth daily. 12/15/14  Yes Geradine Girt, DO  sevelamer carbonate (RENVELA) 800 MG  tablet Take 2,400 mg by mouth 3 (three) times daily with meals.    Yes Historical Provider, MD  simethicone (GAS-X) 80 MG chewable tablet Chew 1 tablet (80 mg total) by mouth every 6 (six) hours as needed for flatulence. 08/29/15  Yes Collier Salina, MD  venlafaxine XR (EFFEXOR XR) 75 MG 24 hr capsule Take 1 capsule (75 mg total) by mouth daily with breakfast. 07/27/15  Yes Shela Leff, MD  multivitamin (RENA-VIT) TABS tablet Take 1 tablet by mouth at bedtime. Patient not taking: Reported on 01/01/2016 12/15/14   Geradine Girt, DO   Physical Exam: Filed Vitals:   01/01/16 1930 01/01/16 2000 01/01/16 2030 01/01/16 2135  BP: 190/111 184/115 164/112 183/124  Pulse: 114 113    Temp:      TempSrc:      Resp: 30 26 25    SpO2: 97% 95%      Wt Readings from Last 3 Encounters:  12/25/15 90.5 kg (199 lb 8.3 oz)  12/21/15 90.5 kg (199 lb 8.3 oz)  10/29/15 90.6 kg (199 lb 11.8 oz)    General:  Appears calm and comfortable Eyes: PERRL, normal lids, irises & conjunctiva ENT: grossly normal hearing, lips & tongue Neck: no LAD, masses or thyromegaly Cardiovascular:  Tachycardic at 108 bpm Positive LE edema. LUE fistula with positive thrill. Telemetry: Sinus tachycardia.  Respiratory: Decreased breath sounds at bases, no crackles, no wheezing, no rhonchi. Abdomen: Bowel sounds positive soft, ntnd Skin: no rash or induration seen on limited exam Musculoskeletal: grossly normal tone BUE/BLE Psychiatric: grossly normal mood and affect, speech fluent and appropriate Neurologic: Awake, alert, oriented  4, grossly non-focal.          Labs on Admission:  Basic Metabolic Panel:  Recent Labs Lab 01/01/16 1600  NA 136  K 4.5  CL 95*  CO2 23  GLUCOSE 80  BUN 71*  CREATININE 12.16*  CALCIUM 9.4   CBC:  Recent Labs Lab 01/01/16 1836  WBC 9.6  HGB 8.8*  HCT 27.9*  MCV 78.2  PLT 238    BNP (last 3 results)  Recent Labs  09/18/15 1909 12/21/15 1107 01/01/16 1556  BNP 96.5 1252.2* 2463.4*    Radiological Exams on Admission: Dg Chest 2 View  01/01/2016  CLINICAL DATA:  Mid chest pain and productive cough for 2 weeks. Initial encounter. EXAM: CHEST  2 VIEW COMPARISON:  PA and lateral chest and CT chest 09/18/2015. PA and lateral chest 12/21/2015 and 12/23/2015. FINDINGS: Heart size is upper normal. There is indistinctness of the pulmonary vasculature bilaterally which has worsened since the prior study. Somewhat more focal opacity is present in the right lung base. No pneumothorax or pleural effusion. No focal bony abnormality. IMPRESSION: Appearance of the chest most suggestive of congestive failure. More focal opacity in the right base could be due to more intense edema, atelectasis or less likely pneumonia. Electronically Signed   By: Inge Rise M.D.   On: 01/01/2016 14:19  Echocardiogram: 05/18/2015  LV EF: 45% - 50%  ------------------------------------------------------------------- Indications: Chest pain 786.51.  ------------------------------------------------------------------- History: PMH: Coronary artery  disease. Congestive heart failure. Risk factors: ESRD. Hypertension. Diabetes mellitus.  ------------------------------------------------------------------- Study Conclusions  - Left ventricle: The cavity size was mildly dilated. Wall  thickness was normal. Systolic function was mildly reduced. The  estimated ejection fraction was in the range of 45% to 50%. Mild  diffuse hypokinesis with no identifiable regional variations.  Features are consistent with a pseudonormal left ventricular  filling pattern, with  concomitant abnormal relaxation and  increased filling pressure (grade 2 diastolic dysfunction). - Aortic valve: There was trivial regurgitation. - Mitral valve: Moderately calcified annulus. There was moderate  regurgitation directed centrally. Valve area by pressure  half-time: 2.44 cm^2. Valve area by continuity equation (using  LVOT flow): 1.39 cm^2. - Left atrium: The atrium was severely dilated. - Right ventricle: The cavity size was moderately dilated. Systolic  function was mildly reduced. - Right atrium: The atrium was mildly dilated.  Impressions:  - Direct image comparison with the study from February 2016  confirms a slight reduction in LV systolic function and worsening  secondary mitral insufficiency, as well as elevation in mean left  atrial pressure.  EKG: Independently reviewed. Vent. rate 114 BPM PR interval 137 ms QRS duration 100 ms QT/QTc 357/492 ms P-R-T axes 49 63 76 Sinus tachycardia Probable left atrial enlargement Left ventricular hypertrophy Borderline prolonged QT interval  Assessment/Plan Principal Problem:   Atypical chest pain Admit to telemetry a/observation. Continue supplemental oxygen. Trend troponin levels. Analgesics as needed.  Active Problems:   Hypertension Continue amlodipine 10 mg by mouth daily. Continue carvedilol 50 mg by mouth twice a day.  Monitor blood pressure periodically.    CAD -S/P LAD  BMS 2011, LAD DES 2012- patent cors Feb 2016 Continue antiplatelet agents. Continue beta blocker. NTG as needed.    Diabetes mellitus type 2, diet-controlled (HCC) Continue carbohydrate modified diet. CBG monitoring before meals and bedtime on the hospital.    Diastolic dysfunction-grade 2 I stressed the importance of keeping up with the HD schedule to avoid fluid overload. Continue daily Imdur.    GERD (gastroesophageal reflux disease) Continue proton pump inhibitor.    ESRD (end stage renal disease) on dialysis [facility]) Patient missed his last dialysis and his fluid overload, but in no significant respiratory distress. Hemodialysis orders per nephrology.   Nephrology was consulted.   Code Status: Full code. DVT Prophylaxis: Lovenox. Family Communication:  Disposition Plan: Admit to [facility] for dialysis   Time spent: Over 70 minutes were spent in the process of this admission.   Reubin Milan, M.D. [facility] Pager [phone].

## 2016-01-02 DIAGNOSIS — Z992 Dependence on renal dialysis: Secondary | ICD-10-CM

## 2016-01-02 DIAGNOSIS — Z955 Presence of coronary angioplasty implant and graft: Secondary | ICD-10-CM | POA: Diagnosis not present

## 2016-01-02 DIAGNOSIS — I2510 Atherosclerotic heart disease of native coronary artery without angina pectoris: Secondary | ICD-10-CM | POA: Diagnosis not present

## 2016-01-02 DIAGNOSIS — Z7982 Long term (current) use of aspirin: Secondary | ICD-10-CM | POA: Diagnosis not present

## 2016-01-02 DIAGNOSIS — I120 Hypertensive chronic kidney disease with stage 5 chronic kidney disease or end stage renal disease: Secondary | ICD-10-CM | POA: Diagnosis not present

## 2016-01-02 DIAGNOSIS — I519 Heart disease, unspecified: Secondary | ICD-10-CM

## 2016-01-02 DIAGNOSIS — I5033 Acute on chronic diastolic (congestive) heart failure: Secondary | ICD-10-CM | POA: Diagnosis not present

## 2016-01-02 DIAGNOSIS — I132 Hypertensive heart and chronic kidney disease with heart failure and with stage 5 chronic kidney disease, or end stage renal disease: Secondary | ICD-10-CM | POA: Diagnosis present

## 2016-01-02 DIAGNOSIS — Z87891 Personal history of nicotine dependence: Secondary | ICD-10-CM | POA: Diagnosis not present

## 2016-01-02 DIAGNOSIS — K219 Gastro-esophageal reflux disease without esophagitis: Secondary | ICD-10-CM | POA: Diagnosis present

## 2016-01-02 DIAGNOSIS — Z9861 Coronary angioplasty status: Secondary | ICD-10-CM

## 2016-01-02 DIAGNOSIS — N186 End stage renal disease: Secondary | ICD-10-CM

## 2016-01-02 DIAGNOSIS — Z7902 Long term (current) use of antithrombotics/antiplatelets: Secondary | ICD-10-CM | POA: Diagnosis not present

## 2016-01-02 DIAGNOSIS — E1142 Type 2 diabetes mellitus with diabetic polyneuropathy: Secondary | ICD-10-CM | POA: Diagnosis present

## 2016-01-02 DIAGNOSIS — E785 Hyperlipidemia, unspecified: Secondary | ICD-10-CM | POA: Diagnosis present

## 2016-01-02 DIAGNOSIS — F329 Major depressive disorder, single episode, unspecified: Secondary | ICD-10-CM | POA: Diagnosis present

## 2016-01-02 DIAGNOSIS — D638 Anemia in other chronic diseases classified elsewhere: Secondary | ICD-10-CM | POA: Diagnosis present

## 2016-01-02 DIAGNOSIS — E1122 Type 2 diabetes mellitus with diabetic chronic kidney disease: Secondary | ICD-10-CM | POA: Diagnosis present

## 2016-01-02 DIAGNOSIS — D631 Anemia in chronic kidney disease: Secondary | ICD-10-CM | POA: Diagnosis not present

## 2016-01-02 DIAGNOSIS — Z9119 Patient's noncompliance with other medical treatment and regimen: Secondary | ICD-10-CM | POA: Diagnosis not present

## 2016-01-02 DIAGNOSIS — N2581 Secondary hyperparathyroidism of renal origin: Secondary | ICD-10-CM | POA: Diagnosis not present

## 2016-01-02 DIAGNOSIS — R079 Chest pain, unspecified: Secondary | ICD-10-CM | POA: Diagnosis not present

## 2016-01-02 DIAGNOSIS — E119 Type 2 diabetes mellitus without complications: Secondary | ICD-10-CM | POA: Diagnosis not present

## 2016-01-02 DIAGNOSIS — I248 Other forms of acute ischemic heart disease: Secondary | ICD-10-CM | POA: Diagnosis present

## 2016-01-02 DIAGNOSIS — I5043 Acute on chronic combined systolic (congestive) and diastolic (congestive) heart failure: Secondary | ICD-10-CM | POA: Diagnosis present

## 2016-01-02 DIAGNOSIS — Z8611 Personal history of tuberculosis: Secondary | ICD-10-CM | POA: Diagnosis not present

## 2016-01-02 DIAGNOSIS — Z9981 Dependence on supplemental oxygen: Secondary | ICD-10-CM | POA: Diagnosis not present

## 2016-01-02 LAB — COMPREHENSIVE METABOLIC PANEL
ALK PHOS: 70 U/L (ref 38–126)
ALT: 24 U/L (ref 17–63)
ANION GAP: 20 — AB (ref 5–15)
AST: 7 U/L — ABNORMAL LOW (ref 15–41)
Albumin: 3.4 g/dL — ABNORMAL LOW (ref 3.5–5.0)
BUN: 75 mg/dL — ABNORMAL HIGH (ref 6–20)
CALCIUM: 9.2 mg/dL (ref 8.9–10.3)
CHLORIDE: 99 mmol/L — AB (ref 101–111)
CO2: 20 mmol/L — AB (ref 22–32)
Creatinine, Ser: 13.6 mg/dL — ABNORMAL HIGH (ref 0.61–1.24)
GFR calc non Af Amer: 3 mL/min — ABNORMAL LOW (ref >60)
GFR, EST AFRICAN AMERICAN: 4 mL/min — AB (ref >60)
Glucose, Bld: 97 mg/dL (ref 65–99)
Potassium: 4.6 mmol/L (ref 3.5–5.1)
SODIUM: 139 mmol/L (ref 135–145)
Total Bilirubin: 0.6 mg/dL (ref 0.3–1.2)
Total Protein: 7.7 g/dL (ref 6.5–8.1)

## 2016-01-02 LAB — CBC WITH DIFFERENTIAL/PLATELET
BASOS PCT: 0 %
Basophils Absolute: 0 10*3/uL (ref 0.0–0.1)
Eosinophils Absolute: 0.3 10*3/uL (ref 0.0–0.7)
Eosinophils Relative: 4 %
HEMATOCRIT: 29.7 % — AB (ref 39.0–52.0)
HEMOGLOBIN: 9 g/dL — AB (ref 13.0–17.0)
LYMPHS ABS: 1.2 10*3/uL (ref 0.7–4.0)
Lymphocytes Relative: 16 %
MCH: 24.1 pg — ABNORMAL LOW (ref 26.0–34.0)
MCHC: 30.3 g/dL (ref 30.0–36.0)
MCV: 79.4 fL (ref 78.0–100.0)
MONOS PCT: 6 %
Monocytes Absolute: 0.4 10*3/uL (ref 0.1–1.0)
NEUTROS ABS: 5.5 10*3/uL (ref 1.7–7.7)
NEUTROS PCT: 74 %
Platelets: 241 10*3/uL (ref 150–400)
RBC: 3.74 MIL/uL — AB (ref 4.22–5.81)
RDW: 18.7 % — ABNORMAL HIGH (ref 11.5–15.5)
WBC: 7.3 10*3/uL (ref 4.0–10.5)

## 2016-01-02 LAB — RENAL FUNCTION PANEL
ALBUMIN: 3.2 g/dL — AB (ref 3.5–5.0)
Anion gap: 20 — ABNORMAL HIGH (ref 5–15)
BUN: 77 mg/dL — AB (ref 6–20)
CALCIUM: 9.2 mg/dL (ref 8.9–10.3)
CO2: 19 mmol/L — ABNORMAL LOW (ref 22–32)
CREATININE: 13.41 mg/dL — AB (ref 0.61–1.24)
Chloride: 99 mmol/L — ABNORMAL LOW (ref 101–111)
GFR calc Af Amer: 4 mL/min — ABNORMAL LOW (ref >60)
GFR, EST NON AFRICAN AMERICAN: 4 mL/min — AB (ref >60)
GLUCOSE: 99 mg/dL (ref 65–99)
PHOSPHORUS: 12.1 mg/dL — AB (ref 2.5–4.6)
POTASSIUM: 4.5 mmol/L (ref 3.5–5.1)
SODIUM: 138 mmol/L (ref 135–145)

## 2016-01-02 LAB — GLUCOSE, CAPILLARY
Glucose-Capillary: 106 mg/dL — ABNORMAL HIGH (ref 65–99)
Glucose-Capillary: 126 mg/dL — ABNORMAL HIGH (ref 65–99)
Glucose-Capillary: 123 mg/dL — ABNORMAL HIGH (ref 65–99)
Glucose-Capillary: 114 mg/dL — ABNORMAL HIGH (ref 65–99)

## 2016-01-02 LAB — CBC
HEMATOCRIT: 27.5 % — AB (ref 39.0–52.0)
Hemoglobin: 8.4 g/dL — ABNORMAL LOW (ref 13.0–17.0)
MCH: 24.2 pg — ABNORMAL LOW (ref 26.0–34.0)
MCHC: 30.5 g/dL (ref 30.0–36.0)
MCV: 79.3 fL (ref 78.0–100.0)
PLATELETS: 230 10*3/uL (ref 150–400)
RBC: 3.47 MIL/uL — ABNORMAL LOW (ref 4.22–5.81)
RDW: 18.8 % — AB (ref 11.5–15.5)
WBC: 8.1 10*3/uL (ref 4.0–10.5)

## 2016-01-02 LAB — TROPONIN I
TROPONIN I: 0.06 ng/mL — AB (ref <0.031)
Troponin I: 0.05 ng/mL — ABNORMAL HIGH (ref <0.031)

## 2016-01-02 MED ORDER — CLOPIDOGREL BISULFATE 75 MG PO TABS
75.0000 mg | ORAL_TABLET | Freq: Every day | ORAL | Status: DC
  Administered 2016-01-02 – 2016-01-03 (×2): 75 mg via ORAL
  Filled 2016-01-02 (×2): qty 1

## 2016-01-02 MED ORDER — NITROGLYCERIN 0.4 MG SL SUBL
0.4000 mg | SUBLINGUAL_TABLET | SUBLINGUAL | Status: DC | PRN
  Administered 2016-01-02: 0.4 mg via SUBLINGUAL
  Filled 2016-01-02 (×2): qty 1

## 2016-01-02 MED ORDER — GUAIFENESIN-CODEINE 100-10 MG/5ML PO SOLN
10.0000 mL | Freq: Four times a day (QID) | ORAL | Status: DC | PRN
  Administered 2016-01-02 – 2016-01-03 (×2): 10 mL via ORAL
  Filled 2016-01-02 (×2): qty 10

## 2016-01-02 MED ORDER — PENTAFLUOROPROP-TETRAFLUOROETH EX AERO: 1.0000 "application " | INHALATION_SPRAY | CUTANEOUS | Status: DC | PRN

## 2016-01-02 MED ORDER — PREGABALIN 25 MG PO CAPS
50.0000 mg | ORAL_CAPSULE | Freq: Every day | ORAL | Status: DC
  Administered 2016-01-02 – 2016-01-03 (×2): 50 mg via ORAL
  Filled 2016-01-02 (×2): qty 2

## 2016-01-02 MED ORDER — LEVALBUTEROL HCL 1.25 MG/0.5ML IN NEBU
1.2500 mg | INHALATION_SOLUTION | Freq: Three times a day (TID) | RESPIRATORY_TRACT | Status: DC
  Administered 2016-01-02 – 2016-01-03 (×2): 1.25 mg via RESPIRATORY_TRACT
  Filled 2016-01-02 (×3): qty 0.5

## 2016-01-02 MED ORDER — ACETAMINOPHEN 500 MG PO TABS
500.0000 mg | ORAL_TABLET | Freq: Four times a day (QID) | ORAL | Status: DC | PRN
  Administered 2016-01-02 (×2): 500 mg via ORAL
  Filled 2016-01-02 (×2): qty 1

## 2016-01-02 MED ORDER — SODIUM CHLORIDE 0.9% FLUSH
3.0000 mL | Freq: Two times a day (BID) | INTRAVENOUS | Status: DC
  Administered 2016-01-02 – 2016-01-03 (×2): 3 mL via INTRAVENOUS

## 2016-01-02 MED ORDER — ENOXAPARIN SODIUM 30 MG/0.3ML ~~LOC~~ SOLN
30.0000 mg | Freq: Every day | SUBCUTANEOUS | Status: DC
  Administered 2016-01-02: 30 mg via SUBCUTANEOUS
  Filled 2016-01-02: qty 0.3

## 2016-01-02 MED ORDER — LIDOCAINE-PRILOCAINE 2.5-2.5 % EX CREA
1.0000 "application " | TOPICAL_CREAM | CUTANEOUS | Status: DC | PRN
  Filled 2016-01-02: qty 5

## 2016-01-02 MED ORDER — PANTOPRAZOLE SODIUM 40 MG IV SOLR: 40.0000 mg | INTRAVENOUS | Status: DC

## 2016-01-02 MED ORDER — HEPARIN SODIUM (PORCINE) 5000 UNIT/ML IJ SOLN
5000.0000 [IU] | Freq: Three times a day (TID) | INTRAMUSCULAR | Status: DC
  Administered 2016-01-02 – 2016-01-03 (×3): 5000 [IU] via SUBCUTANEOUS
  Filled 2016-01-02 (×3): qty 1

## 2016-01-02 MED ORDER — SEVELAMER CARBONATE 800 MG PO TABS
2400.0000 mg | ORAL_TABLET | Freq: Three times a day (TID) | ORAL | Status: DC
  Administered 2016-01-02 – 2016-01-03 (×4): 2400 mg via ORAL
  Filled 2016-01-02 (×4): qty 3

## 2016-01-02 MED ORDER — SODIUM CHLORIDE 0.9 % IV SOLN: 100.0000 mL | INTRAVENOUS | Status: DC | PRN

## 2016-01-02 MED ORDER — SODIUM CHLORIDE 0.9 % IV SOLN: 62.5000 mg | INTRAVENOUS | Status: DC

## 2016-01-02 MED ORDER — HEPARIN SODIUM (PORCINE) 1000 UNIT/ML DIALYSIS: 6000.0000 [IU] | Freq: Once | INTRAMUSCULAR | Status: DC

## 2016-01-02 MED ORDER — ONDANSETRON HCL 4 MG/2ML IJ SOLN: 4.0000 mg | Freq: Four times a day (QID) | INTRAMUSCULAR | Status: DC | PRN

## 2016-01-02 MED ORDER — VENLAFAXINE HCL ER 75 MG PO CP24
75.0000 mg | ORAL_CAPSULE | Freq: Every day | ORAL | Status: DC
  Administered 2016-01-02 – 2016-01-03 (×2): 75 mg via ORAL
  Filled 2016-01-02 (×2): qty 1

## 2016-01-02 MED ORDER — PANTOPRAZOLE SODIUM 40 MG PO TBEC: 40.0000 mg | DELAYED_RELEASE_TABLET | Freq: Every day | ORAL | Status: DC

## 2016-01-02 MED ORDER — AZITHROMYCIN 500 MG PO TABS
500.0000 mg | ORAL_TABLET | Freq: Every day | ORAL | Status: DC
  Administered 2016-01-03: 500 mg via ORAL
  Filled 2016-01-02 (×3): qty 1

## 2016-01-02 MED ORDER — LIDOCAINE HCL (PF) 1 % IJ SOLN: 5.0000 mL | INTRAMUSCULAR | Status: DC | PRN

## 2016-01-02 MED ORDER — SIMETHICONE 80 MG PO CHEW: 80.0000 mg | CHEWABLE_TABLET | Freq: Four times a day (QID) | ORAL | Status: DC | PRN

## 2016-01-02 MED ORDER — ALTEPLASE 2 MG IJ SOLR: 2.0000 mg | Freq: Once | INTRAMUSCULAR | Status: DC | PRN

## 2016-01-02 MED ORDER — PANTOPRAZOLE SODIUM 40 MG PO TBEC
40.0000 mg | DELAYED_RELEASE_TABLET | Freq: Every day | ORAL | Status: DC
  Administered 2016-01-02: 40 mg via ORAL
  Filled 2016-01-02 (×2): qty 1

## 2016-01-02 MED ORDER — HEPARIN SODIUM (PORCINE) 1000 UNIT/ML DIALYSIS: 1000.0000 [IU] | INTRAMUSCULAR | Status: DC | PRN

## 2016-01-02 MED ORDER — CARVEDILOL 25 MG PO TABS
50.0000 mg | ORAL_TABLET | Freq: Two times a day (BID) | ORAL | Status: DC
  Administered 2016-01-02 – 2016-01-03 (×3): 50 mg via ORAL
  Filled 2016-01-02 (×4): qty 2

## 2016-01-02 MED ORDER — CINACALCET HCL 30 MG PO TABS
90.0000 mg | ORAL_TABLET | Freq: Every day | ORAL | Status: DC
  Administered 2016-01-03: 90 mg via ORAL
  Filled 2016-01-02 (×2): qty 3

## 2016-01-02 MED ORDER — ISOSORBIDE MONONITRATE ER 30 MG PO TB24
30.0000 mg | ORAL_TABLET | Freq: Every day | ORAL | Status: DC
  Administered 2016-01-02 – 2016-01-03 (×2): 30 mg via ORAL
  Filled 2016-01-02 (×2): qty 1

## 2016-01-02 MED ORDER — ASPIRIN 81 MG PO CHEW
81.0000 mg | CHEWABLE_TABLET | Freq: Every day | ORAL | Status: DC
  Administered 2016-01-02 – 2016-01-03 (×2): 81 mg via ORAL
  Filled 2016-01-02 (×2): qty 1

## 2016-01-02 MED ORDER — ATORVASTATIN CALCIUM 20 MG PO TABS
20.0000 mg | ORAL_TABLET | Freq: Every day | ORAL | Status: DC
  Administered 2016-01-02 (×2): 20 mg via ORAL
  Filled 2016-01-02 (×2): qty 1

## 2016-01-02 MED ORDER — IPRATROPIUM BROMIDE 0.02 % IN SOLN
0.5000 mg | Freq: Three times a day (TID) | RESPIRATORY_TRACT | Status: DC
  Administered 2016-01-02 – 2016-01-03 (×2): 0.5 mg via RESPIRATORY_TRACT
  Filled 2016-01-02 (×3): qty 2.5

## 2016-01-02 MED ORDER — AMITRIPTYLINE HCL 50 MG PO TABS
100.0000 mg | ORAL_TABLET | Freq: Every day | ORAL | Status: DC
  Administered 2016-01-02 (×2): 100 mg via ORAL
  Filled 2016-01-02 (×2): qty 2

## 2016-01-02 MED ORDER — MECLIZINE HCL 25 MG PO TABS: 25.0000 mg | ORAL_TABLET | Freq: Three times a day (TID) | ORAL | Status: DC | PRN

## 2016-01-02 MED ORDER — DOXERCALCIFEROL 4 MCG/2ML IV SOLN
2.0000 ug | INTRAVENOUS | Status: DC
  Administered 2016-01-03: 2 ug via INTRAVENOUS
  Filled 2016-01-02: qty 2

## 2016-01-02 NOTE — Progress Notes (Signed)
This RN is notified by NT that pt is confused and stated that "this is 360 from his normal status". Upon arrival to patient's room, the Charge nurse and the NT was inside pt's room getting vs. After assessing the pt, no neuro deficit was noted. The charge nurse call the RRT which suggested to check pt's BS which was 106. MD was notified. Pt is put back to bed, bed alarm set on. We'll continue to monitor.

## 2016-01-02 NOTE — Procedures (Signed)
Patient was seen on dialysis and the procedure was supervised. BFR 400  Via LUE AVF BP is 162/100.  Patient appears to be tolerating treatment well

## 2016-01-02 NOTE — Progress Notes (Signed)
Pt arrives in the unit via [facility].

## 2016-01-02 NOTE — Care Management Obs Status (Signed)
[place] NOTIFICATION   Patient Details  Name: Charlis Stuchell MRN: [id number] Date of Birth: October 28, 1955   Medicare Observation Status Notification Given:  Yes    Dawayne Patricia, RN 01/02/2016, 4:01 PM

## 2016-01-02 NOTE — Progress Notes (Signed)
Dialysis called report given to Vons[place].

## 2016-01-02 NOTE — Progress Notes (Signed)
[facility] Progress Note                                            Patient Demographics:    [name] [name], is a 60 y.o. male, DOB - March 27, 1956, [id number]  Admit date - 01/01/2016   Admitting Physician Reubin Milan, MD  Outpatient Primary MD for the patient is [name]  LOS -   Outpatient Specialists:   Chief Complaint  Patient presents with  . Cough  . Chest Pain        Subjective:    [name] [name] today has, No headache, No chest pain, No abdominal pain - No Nausea, No new weakness tingling or numbness, No Cough - Improved SOB.     Assessment  & Plan :      1.Shortness of breath caused by fluid overload due to acute on chronic combined systolic and diastolic heart failure EF 45% on last echogram. This was caused by missed dialysis, patient is being dialyzed with good effect already shortness of breath is much improved. Currently no chest pressure or pain. Counseled on compliance with dialysis.  2. History of CAD with stents in 2011 in 2012, Mild non-ACS pattern troponin rise due to demand ischemia from fluid overload and heart failure, patient has ESRD. EKG nonacute. Symptoms 3. Continue aspirin, Plavix, beta blocker statin and Imdur for secondary prevention. Fluid removal through dialysis. Likely discharge in the morning with outpatient cardiology follow-up.  3. ESRD. Noncompliant. Counseled, on Tuesday, Thursday and Saturday schedule. Renal consulted being dialyzed.  4. Possible mild URI. No fever or leukocytosis. Likely viral given monitor.  5. Central hypertension. Currently on Norvasc, Coreg and Imdur. Will monitor.  6.  Depression - not suicidal or homicidal. Continue combination of Effexor and Elavil.  7. Anemia of chronic disease. Monitor.  8. GERD. On PPI.    Code Status : Full  Family Communication  : none  Disposition Plan  : Home 1-2 days  Barriers For Discharge : SOB  Consults  :  Renal  Procedures  :    DVT Prophylaxis  :  Heparin    Lab Results  Component Value Date   PLT 230 01/02/2016    Antibiotics  :     Anti-infectives    None        Objective:   Filed Vitals:   01/02/16 0830 01/02/16 0900 01/02/16 0930 01/02/16 1000  BP: 164/95 171/101 167/96 163/97  Pulse: 107 106 107 114  Temp:      TempSrc:      Resp:      Weight:      SpO2:        Wt Readings from Last  3 Encounters:  01/02/16 92.6 kg (204 lb 2.3 oz)  12/25/15 90.5 kg (199 lb 8.3 oz)  12/21/15 90.5 kg (199 lb 8.3 oz)    No intake or output data in the 24 hours ending 01/02/16 1016   Physical Exam  Awake Alert, Oriented X 3, No new F.N deficits, Normal affect [place].AT,PERRAL Supple Neck,No JVD, No cervical lymphadenopathy appriciated.  Symmetrical Chest wall movement, Good air movement bilaterally, CTAB RRR,No Gallops,Rubs or new Murmurs, No Parasternal Heave +ve B.Sounds, Abd Soft, No tenderness, No organomegaly appriciated, No rebound - guarding or rigidity. No Cyanosis, Clubbing or edema, No new Rash or bruise      Data Review:    CBC  Recent Labs Lab 01/01/16 1836 01/02/16 0604 01/02/16 0654  WBC 9.6 7.3 8.1  HGB 8.8* 9.0* 8.4*  HCT 27.9* 29.7* 27.5*  PLT 238 241 230  MCV 78.2 79.4 79.3  MCH 24.6* 24.1* 24.2*  MCHC 31.5 30.3 30.5  RDW 18.7* 18.7* 18.8*  LYMPHSABS  --  1.2  --   MONOABS  --  0.4  --   EOSABS  --  0.3  --   BASOSABS  --  0.0  --     Chemistries   Recent Labs Lab 01/01/16 1600 01/02/16 0604 01/02/16 0654  NA 136 139 138  K 4.5 4.6 4.5  CL 95* 99* 99*  CO2 23 20* 19*  GLUCOSE 80 97 99  BUN 71* 75* 77*  CREATININE 12.16* 13.60* 13.41*  CALCIUM 9.4  9.2 9.2  AST  --  7*  --   ALT  --  24  --   ALKPHOS  --  70  --   BILITOT  --  0.6  --    ------------------------------------------------------------------------------------------------------------------ No results for input(s): CHOL, HDL, LDLCALC, TRIG, CHOLHDL, LDLDIRECT in the last 72 hours.  Lab Results  Component Value Date   HGBA1C 6.1* 10/29/2015   ------------------------------------------------------------------------------------------------------------------ No results for input(s): TSH, T4TOTAL, T3FREE, THYROIDAB in the last 72 hours.  Invalid input(s): FREET3 ------------------------------------------------------------------------------------------------------------------ No results for input(s): VITAMINB12, FOLATE, FERRITIN, TIBC, IRON, RETICCTPCT in the last 72 hours.  Coagulation profile No results for input(s): INR, PROTIME in the last 168 hours.  No results for input(s): DDIMER in the last 72 hours.  Cardiac Enzymes  Recent Labs Lab 01/02/16 0037 01/02/16 0604  TROPONINI 0.05* 0.06*   ------------------------------------------------------------------------------------------------------------------    Component Value Date/Time   BNP 2463.4* 01/01/2016 1556    Inpatient Medications  Scheduled Meds: . amitriptyline  100 mg Oral QHS  . amLODipine  10 mg Oral Daily  . aspirin  81 mg Oral Daily  . atorvastatin  20 mg Oral QHS  . carvedilol  50 mg Oral BID WC  . cinacalcet  90 mg Oral Q supper  . clopidogrel  75 mg Oral Daily  . [START ON 01/03/2016] doxercalciferol  2 mcg Intravenous Q T,Th,Sa-HD  . [START ON 01/05/2016] ferric gluconate (FERRLECIT/NULECIT) IV  62.5 mg Intravenous Q Thu-HD  . [START ON 01/03/2016] heparin  6,000 Units Dialysis Once in dialysis  . ipratropium  0.5 mg Nebulization TID  . isosorbide mononitrate  30 mg Oral Daily  . levalbuterol  1.25 mg Nebulization TID  . pantoprazole (PROTONIX) IV  40 mg Intravenous Q24H  . pregabalin   50 mg Oral Daily  . sevelamer carbonate  2,400 mg Oral TID WC  . sodium chloride flush  3 mL Intravenous Q12H  . venlafaxine XR  75 mg Oral Q breakfast   Continuous  Infusions:  PRN Meds:.sodium chloride, sodium chloride, acetaminophen, alteplase, heparin, lidocaine (PF), lidocaine-prilocaine, meclizine, nitroGLYCERIN, ondansetron (ZOFRAN) IV, pentafluoroprop-tetrafluoroeth, simethicone  Micro Results Recent Results (from the past 240 hour(s))  Urine culture     Status: None   Collection Time: 12/23/15  1:32 PM  Result Value Ref Range Status   Specimen Description URINE, CLEAN CATCH  Final   Special Requests NONE  Final   Culture 1,000 COLONIES/mL INSIGNIFICANT GROWTH  Final   Report Status 12/24/2015 FINAL  Final    Radiology Reports Dg Chest 2 View  01/01/2016  CLINICAL DATA:  Mid chest pain and productive cough for 2 weeks. Initial encounter. EXAM: CHEST  2 VIEW COMPARISON:  PA and lateral chest and CT chest 09/18/2015. PA and lateral chest 12/21/2015 and 12/23/2015. FINDINGS: Heart size is upper normal. There is indistinctness of the pulmonary vasculature bilaterally which has worsened since the prior study. Somewhat more focal opacity is present in the right lung base. No pneumothorax or pleural effusion. No focal bony abnormality. IMPRESSION: Appearance of the chest most suggestive of congestive failure. More focal opacity in the right base could be due to more intense edema, atelectasis or less likely pneumonia. Electronically Signed   By: Inge Rise M.D.   On: 01/01/2016 14:19   Dg Chest 2 View  12/23/2015  CLINICAL DATA:  Chest pain, weakness, nausea and vomiting EXAM: CHEST  2 VIEW COMPARISON:  12/20/2013 FINDINGS: Similar cardiomegaly with central vascular congestion/ interstitial edema pattern compatible with mild CHF. No focal pneumonia, collapse or consolidation. No significant effusion or pneumothorax. Coronary stents noted. Trachea is midline. Normal bowel gas pattern.  IMPRESSION: Minor central interstitial edema pattern.  No significant change. Electronically Signed   By: Jerilynn Mages.  Shick M.D.   On: 12/23/2015 11:17   Dg Chest 2 View  12/21/2015  CLINICAL DATA:  Cough for 5 days EXAM: CHEST  2 VIEW COMPARISON:  October 28, 2015 FINDINGS: Mild generalized interstitial edema remains. There is no airspace consolidation. Heart is mildly enlarged with pulmonary venous hypertension. No adenopathy. There is a coronary stent in the left anterior descending coronary artery. No bone lesions evident. IMPRESSION: Evidence a degree of congestive heart failure. No airspace consolidation. Electronically Signed   By: Lowella Grip III M.D.   On: 12/21/2015 10:44   Dg Abd Portable 1v  12/23/2015  CLINICAL DATA:  Abdominal pain for 3 days.  Emesis. EXAM: PORTABLE ABDOMEN - 1 VIEW COMPARISON:  October 30, 2015 CT abdomen and pelvis FINDINGS: There is moderate stool throughout the colon. There is no bowel dilatation or air-fluid level suggesting obstruction. No free air is seen on this supine examination. There are vascular calcifications in the pelvis. IMPRESSION: Moderate stool throughout colon.  No bowel obstruction or free air. Electronically Signed   By: Lowella Grip III M.D.   On: 12/23/2015 13:48    Time Spent in minutes   35   Zaid Tomes K M.D on 01/02/2016 at 10:16 AM  Between 7am to 7pm - Pager - [phone]  After 7pm go to [email or web address] - password [facility]  [facility] -  Office  [phone]

## 2016-01-03 DIAGNOSIS — I5033 Acute on chronic diastolic (congestive) heart failure: Secondary | ICD-10-CM

## 2016-01-03 LAB — CBC
HCT: 28.2 % — ABNORMAL LOW (ref 39.0–52.0)
Hemoglobin: 8.3 g/dL — ABNORMAL LOW (ref 13.0–17.0)
MCH: 23.8 pg — ABNORMAL LOW (ref 26.0–34.0)
MCHC: 29.4 g/dL — ABNORMAL LOW (ref 30.0–36.0)
MCV: 80.8 fL (ref 78.0–100.0)
PLATELETS: 268 10*3/uL (ref 150–400)
RBC: 3.49 MIL/uL — ABNORMAL LOW (ref 4.22–5.81)
RDW: 18.5 % — AB (ref 11.5–15.5)
WBC: 6.8 10*3/uL (ref 4.0–10.5)

## 2016-01-03 LAB — GLUCOSE, CAPILLARY
GLUCOSE-CAPILLARY: 116 mg/dL — AB (ref 65–99)
Glucose-Capillary: 93 mg/dL (ref 65–99)

## 2016-01-03 LAB — RENAL FUNCTION PANEL
Albumin: 2.9 g/dL — ABNORMAL LOW (ref 3.5–5.0)
Anion gap: 14 (ref 5–15)
BUN: 46 mg/dL — AB (ref 6–20)
CALCIUM: 9.2 mg/dL (ref 8.9–10.3)
CHLORIDE: 96 mmol/L — AB (ref 101–111)
CO2: 25 mmol/L (ref 22–32)
CREATININE: 9.55 mg/dL — AB (ref 0.61–1.24)
GFR calc non Af Amer: 5 mL/min — ABNORMAL LOW (ref >60)
GFR, EST AFRICAN AMERICAN: 6 mL/min — AB (ref >60)
GLUCOSE: 114 mg/dL — AB (ref 65–99)
Phosphorus: 8.7 mg/dL — ABNORMAL HIGH (ref 2.5–4.6)
Potassium: 4.7 mmol/L (ref 3.5–5.1)
SODIUM: 135 mmol/L (ref 135–145)

## 2016-01-03 MED ORDER — AZITHROMYCIN 250 MG PO TABS: 250.0000 mg | ORAL_TABLET | Freq: Every day | ORAL | Status: DC

## 2016-01-03 MED ORDER — HEPARIN SODIUM (PORCINE) 1000 UNIT/ML DIALYSIS: 20.0000 [IU]/kg | INTRAMUSCULAR | Status: DC | PRN

## 2016-01-03 MED ORDER — DOXERCALCIFEROL 4 MCG/2ML IV SOLN
INTRAVENOUS | Status: AC
  Filled 2016-01-03: qty 2

## 2016-01-03 MED ORDER — DEXTROMETHORPHAN-GUAIFENESIN 10-100 MG/5ML PO LIQD: 10.0000 mL | ORAL | Status: DC | PRN

## 2016-01-03 NOTE — Progress Notes (Signed)
01/03/2016 6:11 PM Discharge AVS meds taken today and those due this evening reviewed.  Follow-up appointments and when to call md reviewed.  D/C IV and TELE.  Questions and concerns addressed.   D/C home per orders. [place]

## 2016-01-03 NOTE — Discharge Summary (Signed)
[name] [name], is a 60 y.o. male  DOB November 11, 1955  MRN [id number].  Admission date:  01/01/2016  Admitting Physician  Reubin Milan, MD  Discharge Date:  01/03/2016   Primary MD  [name]  Recommendations for primary care physician for things to follow:   CHECK CBC, BMP next visit. Check a two-view chest x-ray in a week.   Admission Diagnosis  ESRD needing dialysis ([place]) [N18.6] Hypervolemia, unspecified hypervolemia type [E87.70]   Discharge Diagnosis  ESRD needing dialysis ([place]) [N18.6] Hypervolemia, unspecified hypervolemia type [E87.70]    Principal Problem:   Atypical chest pain Active Problems:   Hypertension   CAD -S/P LAD BMS 2011, LAD DES 2012- patent cors Feb 2016   Diabetes mellitus type 2, diet-controlled ([place])   Diastolic dysfunction-grade 2   GERD (gastroesophageal reflux disease)   ESRD (end stage renal disease) on dialysis ([facility])   Acute on chronic diastolic CHF (congestive heart failure), NYHA class 1 ([place])      Past Medical History  Diagnosis Date  . Hypertension   . Hematochezia     a. 2014: colonscopy, which showed moderately-sized internal hemorrhoids, two 37mm polyps in transverse colon and ascending colon that were resected, five 2-34mm polyps in sigmoid colon, descending colon, transverse colon, and ascending colon that were resected. An upper endoscopy was performed and showed normal esophagus, stomach, and duodenum.  . Hematuria     a. H/o hematuria 2014 with cystoscopy that was unrevealing for his source of hematuria. He underwent a kidney ultrasound on 10/14 that showed mildly echogenic and scarred kidneys compatible with medical renal disease, without hydronephrosis or renal calculi.  [name] Anemia   . CAD (coronary artery disease)     a. per [facility] s/p 3.67mm x  80mm Vision BMS to mid LAD 12/2009 and Xience DES to mid LAD 10/2010.  . Colon polyps   . Chronic diastolic CHF (congestive heart failure) ([place])   . Hyperlipidemia   . Anginal pain ([place])   . Heart murmur   . 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 Type II diabetes mellitus ([place])   . History of blood transfusion     "had colonoscopy done; they had to give me some blood"  . Daily headache   . ESRD on dialysis [facility]) since ~ 2008    "[facility]; TTS" (07/21/2015)  . On home oxygen therapy     "2L prn" (07/21/2015)  . Renal insufficiency     Past Surgical History  Procedure Laterality Date  . Left heart catheterization with coronary angiogram N/A 11/23/2014    Procedure: LEFT HEART CATHETERIZATION WITH CORONARY ANGIOGRAM;  Surgeon: Troy Sine, MD;  Location: [facility] CATH LAB;  Service: Cardiovascular;  Laterality: N/A;  . Lithotripsy  X1  . Cystoscopy w/ stone manipulation  X2?  . Cardiac catheterization  "several"  . Coronary angioplasty with stent placement  "several"  . Eye surgery Bilateral     "laser OR for  hemorrhage"  . Av fistula placement Left ~ 2007    "upper arm"       HPI  from the history and physical done on the day of admission:    [name] [name] is a 60 y.o. 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 He denies fever, chills, but complains of fatigue. He missed his last hemodialysis session.   Patient also stated that he had a black color stool this morning and one episode of coffee-ground emesis. However, stool guaiac was negative and the patient's H&H is stable. He denies abdominal pain, diarrhea, constipation or hematochezia. He is states that he had a  low dose aspirin this morning and his taking Plavix on a regular basis.  When seen in the emergency department, the patient was in no acute distress. Workup shows stable anemia, a BNP level which has doubled since 10 days ago and a chest radiograph showing CHF pattern.      Hospital Course:     1.Shortness of breath caused by fluid overload due to acute on chronic combined systolic and diastolic heart failure EF 45% on last echogram. This was caused by missed dialysis, patient was dialyzed with good effect and now shortness of breath has resolved and he is sleeping on room air with good pulse ox and mid 90s. He likely had atypical chest pain due to fluid overload and CHF, Currently no chest pressure or pain. Counseled on compliance with dialysis.  2. History of CAD with stents in 2011 in 2012, Mild non-ACS pattern troponin rise due to demand ischemia from fluid overload and heart failure, patient has ESRD. EKG nonacute. Symptoms completely resolved. Continue aspirin, Plavix, beta blocker statin and Imdur for secondary prevention. Fluid removal through dialysis as being done. We will discharge I have requested him to follow with his primary cardiologist in 1-2 weeks.  3. ESRD. Noncompliant. Counseled, on Tuesday, Thursday and Saturday schedule. Renal consulted was dialyzed.  4. Possible mild URI. No fever or leukocytosis. Placed on azithromycin for 5 days along with cough medication, no fever or oxygen demand.  5. Hypertension. Currently on Norvasc, Coreg and Imdur. Will monitor.  6. Depression - not suicidal or homicidal. Continue combination of Effexor and Elavil.  7. Anemia of chronic disease. Monitor.  8. GERD. On PPI.        Discharge Condition: Stable  Follow UP  Follow-up Information    Follow up with PCP and Kidney doctor. Schedule an appointment as soon as possible for a visit in 3 days.       Consults obtained - Renal  Diet and Activity recommendation: See Discharge  Instructions below  Discharge Instructions       Discharge Instructions    Discharge instructions    Complete by:  As directed   Follow with Primary MD  in 7 days   Get CBC, CMP, 2 view Chest X ray checked  by Primary MD next visit.    Activity: As tolerated with Full fall precautions use walker/cane & assistance as needed   Disposition Home    Diet:   Renal  Check your Weight same time everyday, if you gain over 2 pounds, or you develop in leg swelling, experience more shortness of breath or chest pain,  call your Primary MD immediately. Follow Cardiac Low Salt Diet and 1.2 lit/day fluid restriction.   On your next visit with your primary care physician please Get Medicines reviewed and adjusted.   Please request your Prim.MD to go over all Hospital Tests and Procedure/Radiological results at the follow up, please get all Hospital records sent to your Prim MD by signing hospital release before you go home.   If you experience worsening of your admission symptoms, develop shortness of breath, life threatening emergency, suicidal or homicidal thoughts you must seek medical attention immediately by calling 911 or calling your MD immediately  if symptoms less severe.  You Must read complete instructions/literature along with all the possible adverse reactions/side effects for all the Medicines you take and that have been prescribed to you. Take any new Medicines after you have completely understood and accpet all the possible adverse reactions/side effects.   Do not drive, operating heavy machinery, perform activities at heights, swimming or participation in water activities or provide baby sitting services if your were admitted for syncope or siezures until you have seen by Primary MD or a Neurologist and advised to do so again.  Do not drive when taking Pain medications.    Do not take more than prescribed Pain, Sleep and Anxiety Medications  Special Instructions: If you have  smoked or chewed Tobacco  in the last 2 yrs please stop smoking, stop any regular Alcohol  and or any Recreational drug use.  Wear Seat belts while driving.   Please note  You were cared for by a hospitalist during your hospital stay. If you have any questions about your discharge medications or the care you received while you were in the hospital after you are discharged, you can call the unit and asked to speak with the hospitalist on call if the hospitalist that took care of you is not available. Once you are discharged, your primary care physician will handle any further medical issues. Please note that NO REFILLS for any discharge medications will be authorized once you are discharged, as it is imperative that you return to your primary care physician (or establish a relationship with a primary care physician if you do not have one) for your aftercare needs so that they can reassess your need for medications and monitor your lab values.     Increase activity slowly    Complete by:  As directed              Discharge Medications       Medication List    STOP taking these medications        acetaminophen 500 MG tablet  Commonly known as:  TYLENOL      TAKE these medications        amitriptyline 100 MG tablet  Commonly known as:  ELAVIL  Take 1 tablet (100 mg total) by mouth at bedtime.     amLODipine 10 MG tablet  Commonly known as:  NORVASC  Take 1 tablet (10 mg total) by mouth daily.     aspirin 81 MG chewable tablet  Chew 1 tablet (81 mg total) by mouth daily.     atorvastatin 20 MG tablet  Commonly known as:  LIPITOR  Take 20 mg by mouth at bedtime.     azithromycin 250 MG tablet  Commonly known as:  ZITHROMAX  Take 1 tablet (250 mg total) by mouth daily.     carvedilol 25 MG tablet  Commonly known as:  COREG  Take 2 tablets (50 mg total) by mouth 2 (two) times daily.     cinacalcet 90 MG tablet  Commonly known as:  SENSIPAR  Take 90 mg by mouth at  bedtime.     clopidogrel 75 MG tablet  Commonly known as:  PLAVIX  Take 1 tablet (75 mg total) by mouth daily.     Darbepoetin Alfa 25 MCG/0.42ML Sosy injection  Commonly known as:  ARANESP  Inject 0.42 mLs (25 mcg total) into the vein every Tuesday with hemodialysis.     dextromethorphan-guaiFENesin 10-100 MG/5ML liquid  Commonly known as:  TUSSIN DM  Take 10 mLs by mouth every 4 (four) hours as needed for cough.     doxercalciferol 4 MCG/2ML injection  Commonly known as:  HECTOROL  Inject 1 mL (2 mcg total) into the vein Every Tuesday,Thursday,and Saturday with dialysis.     ferric gluconate 62.5 mg in sodium chloride 0.9 % 100 mL  Inject 62.5 mg into the vein every Thursday with hemodialysis.     isosorbide mononitrate 30 MG 24 hr tablet  Commonly known as:  IMDUR  Take 1 tablet (30 mg total) by mouth daily.     meclizine 25 MG tablet  Commonly known as:  ANTIVERT  Take 25 mg by mouth 3 (three) times daily as needed for dizziness.     multivitamin Tabs tablet  Take 1 tablet by mouth at bedtime.     nitroGLYCERIN 0.4 MG SL tablet  Commonly known as:  NITROSTAT  Place 1 tablet (0.4 mg total) under the tongue every 5 (five) minutes as needed for chest pain.     pantoprazole 40 MG tablet  Commonly known as:  PROTONIX  Take 1 tablet (40 mg total) by mouth daily.     pregabalin 50 MG capsule  Commonly known as:  LYRICA  Take 1 capsule (50 mg total) by mouth daily.     sevelamer carbonate 800 MG tablet  Commonly known as:  RENVELA  Take 2,400 mg by mouth 3 (three) times daily with meals.     simethicone 80 MG chewable tablet  Commonly known as:  GAS-X  Chew 1 tablet (80 mg total) by mouth every 6 (six) hours as needed for flatulence.     venlafaxine XR 75 MG 24 hr capsule  Commonly known as:  EFFEXOR XR  Take 1 capsule (75 mg total) by mouth daily with breakfast.        Major procedures and Radiology Reports - PLEASE review detailed and final reports for all  details, in brief -       Dg Chest 2 View  01/01/2016  CLINICAL DATA:  Mid chest pain and productive cough for 2 weeks. Initial encounter. EXAM: CHEST  2 VIEW COMPARISON:  PA and lateral chest and CT chest 09/18/2015. PA and lateral chest 12/21/2015 and 12/23/2015. FINDINGS: Heart size is upper normal. There is indistinctness of the pulmonary vasculature bilaterally which has worsened since the prior study. Somewhat more focal opacity is present in the right lung base. No pneumothorax or pleural effusion. No focal bony abnormality. IMPRESSION: Appearance of the chest most suggestive of congestive failure. More focal opacity in the right base could be due to more intense edema, atelectasis or less likely pneumonia. Electronically Signed   By: Inge Rise M.D.   On: 01/01/2016 14:19   Dg Chest 2 View  12/23/2015  CLINICAL DATA:  Chest pain, weakness, nausea and vomiting EXAM: CHEST  2 VIEW COMPARISON:  12/20/2013 FINDINGS:  Similar cardiomegaly with central vascular congestion/ interstitial edema pattern compatible with mild CHF. No focal pneumonia, collapse or consolidation. No significant effusion or pneumothorax. Coronary stents noted. Trachea is midline. Normal bowel gas pattern. IMPRESSION: Minor central interstitial edema pattern.  No significant change. Electronically Signed   By: Jerilynn Mages.  Shick M.D.   On: 12/23/2015 11:17   Dg Chest 2 View  12/21/2015  CLINICAL DATA:  Cough for 5 days EXAM: CHEST  2 VIEW COMPARISON:  October 28, 2015 FINDINGS: Mild generalized interstitial edema remains. There is no airspace consolidation. Heart is mildly enlarged with pulmonary venous hypertension. No adenopathy. There is a coronary stent in the left anterior descending coronary artery. No bone lesions evident. IMPRESSION: Evidence a degree of congestive heart failure. No airspace consolidation. Electronically Signed   By: Lowella Grip III M.D.   On: 12/21/2015 10:44   Dg Abd Portable 1v  12/23/2015   CLINICAL DATA:  Abdominal pain for 3 days.  Emesis. EXAM: PORTABLE ABDOMEN - 1 VIEW COMPARISON:  October 30, 2015 CT abdomen and pelvis FINDINGS: There is moderate stool throughout the colon. There is no bowel dilatation or air-fluid level suggesting obstruction. No free air is seen on this supine examination. There are vascular calcifications in the pelvis. IMPRESSION: Moderate stool throughout colon.  No bowel obstruction or free air. Electronically Signed   By: Lowella Grip III M.D.   On: 12/23/2015 13:48    Micro Results      No results found for this or any previous visit (from the past 240 hour(s)).     Today   Subjective    [name] [name] today has no headache,no chest abdominal pain,no new weakness tingling or numbness, feels much better wants to go home today.    Objective   Blood pressure 149/90, pulse 99, temperature 97.8 F (36.6 C), temperature source Oral, resp. rate 18, weight 90.1 kg (198 lb 10.2 oz), SpO2 94 %.   Intake/Output Summary (Last 24 hours) at 01/03/16 1017 Last data filed at 01/02/16 2120  Gross per 24 hour  Intake   1078 ml  Output      0 ml  Net   1078 ml    Exam Awake Alert, Oriented x 3, No new F.N deficits, Normal affect [place].AT,PERRAL Supple Neck,No JVD, No cervical lymphadenopathy appriciated.  Symmetrical Chest wall movement, Good air movement bilaterally, CTAB RRR,No Gallops,Rubs or new Murmurs, No Parasternal Heave +ve B.Sounds, Abd Soft, Non tender, No organomegaly appriciated, No rebound -guarding or rigidity. No Cyanosis, Clubbing or edema, No new Rash or bruise   Data Review   CBC w Diff: Lab Results  Component Value Date   WBC 8.1 01/02/2016   HGB 8.4* 01/02/2016   HCT 27.5* 01/02/2016   PLT 230 01/02/2016   LYMPHOPCT 16 01/02/2016   MONOPCT 6 01/02/2016   EOSPCT 4 01/02/2016   BASOPCT 0 01/02/2016    CMP: Lab Results  Component Value Date   NA 138 01/02/2016   K 4.5 01/02/2016   CL 99* 01/02/2016   CO2 19*  01/02/2016   BUN 77* 01/02/2016   CREATININE 13.41* 01/02/2016   PROT 7.7 01/02/2016   ALBUMIN 3.2* 01/02/2016   BILITOT 0.6 01/02/2016   ALKPHOS 70 01/02/2016   AST 7* 01/02/2016   ALT 24 01/02/2016  .   Total Time in preparing paper work, data evaluation and todays exam - 35 minutes  Thurnell Lose M.D on 01/03/2016 at 10:17 AM  [facility]   Office  [phone]

## 2016-01-03 NOTE — Discharge Instructions (Signed)
Follow with Primary MD  in 7 days   Get CBC, CMP, 2 view Chest X ray checked  by Primary MD next visit.    Activity: As tolerated with Full fall precautions use walker/cane & assistance as needed   Disposition Home    Diet:   Renal  Check your Weight same time everyday, if you gain over 2 pounds, or you develop in leg swelling, experience more shortness of breath or chest pain, call your Primary MD immediately. Follow Cardiac Low Salt Diet and 1.2 lit/day fluid restriction.   On your next visit with your primary care physician please Get Medicines reviewed and adjusted.   Please request your Prim.MD to go over all Hospital Tests and Procedure/Radiological results at the follow up, please get all Hospital records sent to your Prim MD by signing hospital release before you go home.   If you experience worsening of your admission symptoms, develop shortness of breath, life threatening emergency, suicidal or homicidal thoughts you must seek medical attention immediately by calling 911 or calling your MD immediately  if symptoms less severe.  You Must read complete instructions/literature along with all the possible adverse reactions/side effects for all the Medicines you take and that have been prescribed to you. Take any new Medicines after you have completely understood and accpet all the possible adverse reactions/side effects.   Do not drive, operating heavy machinery, perform activities at heights, swimming or participation in water activities or provide baby sitting services if your were admitted for syncope or siezures until you have seen by Primary MD or a Neurologist and advised to do so again.  Do not drive when taking Pain medications.    Do not take more than prescribed Pain, Sleep and Anxiety Medications  Special Instructions: If you have smoked or chewed Tobacco  in the last 2 yrs please stop smoking, stop any regular Alcohol  and or any Recreational drug use.  Wear Seat  belts while driving.   Please note  You were cared for by a hospitalist during your hospital stay. If you have any questions about your discharge medications or the care you received while you were in the hospital after you are discharged, you can call the unit and asked to speak with the hospitalist on call if the hospitalist that took care of you is not available. Once you are discharged, your primary care physician will handle any further medical issues. Please note that NO REFILLS for any discharge medications will be authorized once you are discharged, as it is imperative that you return to your primary care physician (or establish a relationship with a primary care physician if you do not have one) for your aftercare needs so that they can reassess your need for medications and monitor your lab values.

## 2016-01-03 NOTE — Clinical Documentation Improvement (Signed)
Internal Medicine Nephrology/Renal  Can chest pain be further specified or possible/likely/suspected underlying condition linked?   Demand ischemia/CAD  URI/bronchitis  Acute on chronic CHF  Volume overload  Other  Clinically Undetermined  Supporting Information: -- ESRD missed last HD due to 'sick' with cough -- On admit A/c combined CHF -- Elevated Troponins from Demand ischemia  Please exercise your independent, professional judgment when responding. A specific answer is not anticipated or expected.   Thank You,  Ezekiel Ina RN [place] [phone]

## 2016-01-03 NOTE — Progress Notes (Signed)
Patient ID: [name] [name], male   DOB: Aug 31, 1956, 60 y.o.   MRN: [id number]  [facility] Progress Note    Subjective:   Feels better   Objective:   BP 149/90 mmHg  Pulse 99  Temp(Src) 97.8 F (36.6 C) (Oral)  Resp 18  Wt 90.1 kg (198 lb 10.2 oz)  SpO2 94%  Intake/Output: I/O last 3 completed shifts: In: 1078 [P.O.:1075; I.V.:3] Out: 3112 [Other:3112]   Intake/Output this shift:    Weight change: -2.388 kg (-5 lb 4.3 oz)  Physical Exam: Gen:WD WN AAM in NAD CVS:no rub Resp:scattered rhonchi [id number] Ext:no edema, LUE AVF +T/B  Labs: BMET  Recent Labs Lab 01/01/16 1600 01/02/16 0604 01/02/16 0654  NA 136 139 138  K 4.5 4.6 4.5  CL 95* 99* 99*  CO2 23 20* 19*  GLUCOSE 80 97 99  BUN 71* 75* 77*  CREATININE 12.16* 13.60* 13.41*  ALBUMIN  --  3.4* 3.2*  CALCIUM 9.4 9.2 9.2  PHOS  --   --  12.1*   CBC  Recent Labs Lab 01/01/16 1836 01/02/16 0604 01/02/16 0654  WBC 9.6 7.3 8.1  NEUTROABS  --  5.5  --   HGB 8.8* 9.0* 8.4*  HCT 27.9* 29.7* 27.5*  MCV 78.2 79.4 79.3  PLT 238 241 230    @IMGRELPRIORS @ Medications:    . amitriptyline  100 mg Oral QHS  . amLODipine  10 mg Oral Daily  . aspirin  81 mg Oral Daily  . atorvastatin  20 mg Oral QHS  . azithromycin  500 mg Oral Daily  . carvedilol  50 mg Oral BID WC  . cinacalcet  90 mg Oral Q supper  . clopidogrel  75 mg Oral Daily  . doxercalciferol  2 mcg Intravenous Q T,Th,Sa-HD  . [START ON 01/05/2016] ferric gluconate (FERRLECIT/NULECIT) IV  62.5 mg Intravenous Q Thu-HD  . heparin subcutaneous  5,000 Units Subcutaneous 3 times per day  . ipratropium  0.5 mg Nebulization TID  . isosorbide mononitrate  30 mg Oral Daily  . levalbuterol  1.25 mg Nebulization TID  . pantoprazole  40 mg Oral QHS  . pregabalin  50 mg Oral Daily  . sevelamer carbonate  2,400 mg Oral TID WC  . sodium chloride flush  3 mL Intravenous Q12H  . venlafaxine XR  75 mg Oral Q breakfast   Dialysis: [facility]  TTS 4h 500800 89.5kg LUA AVF Hep 8000    Assessment/ Plan:   1. SOB- due to noncompliance with HD.  Had HD yesterday and plan another session today and hopeful discharge (thought he was to be discharged yesterday after HD) 2. ESRD:  TTS at [facility], edw 89.5kg but repeatedly noncompliant 3. 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 Nutrition:per primary 6. Hypertension:stable 7. CAD- stable 8. Disposition- hopeful discharge to home after HD today.  Donetta Potts, MD [place] Pager [phone] 01/03/2016, 9:44 AM

## 2016-01-05 DIAGNOSIS — N2581 Secondary hyperparathyroidism of renal origin: Secondary | ICD-10-CM | POA: Diagnosis not present

## 2016-01-05 DIAGNOSIS — D509 Iron deficiency anemia, unspecified: Secondary | ICD-10-CM | POA: Diagnosis not present

## 2016-01-05 DIAGNOSIS — N186 End stage renal disease: Secondary | ICD-10-CM | POA: Diagnosis not present

## 2016-01-05 DIAGNOSIS — E119 Type 2 diabetes mellitus without complications: Secondary | ICD-10-CM | POA: Diagnosis not present

## 2016-01-05 DIAGNOSIS — D631 Anemia in chronic kidney disease: Secondary | ICD-10-CM | POA: Diagnosis not present

## 2016-01-06 DIAGNOSIS — N186 End stage renal disease: Secondary | ICD-10-CM | POA: Diagnosis not present

## 2016-01-06 DIAGNOSIS — Z992 Dependence on renal dialysis: Secondary | ICD-10-CM | POA: Diagnosis not present

## 2016-01-06 DIAGNOSIS — E1122 Type 2 diabetes mellitus with diabetic chronic kidney disease: Secondary | ICD-10-CM | POA: Diagnosis not present

## 2016-01-07 DIAGNOSIS — N186 End stage renal disease: Secondary | ICD-10-CM | POA: Diagnosis not present

## 2016-01-07 DIAGNOSIS — E119 Type 2 diabetes mellitus without complications: Secondary | ICD-10-CM | POA: Diagnosis not present

## 2016-01-07 DIAGNOSIS — D631 Anemia in chronic kidney disease: Secondary | ICD-10-CM | POA: Diagnosis not present

## 2016-01-07 DIAGNOSIS — D509 Iron deficiency anemia, unspecified: Secondary | ICD-10-CM | POA: Diagnosis not present

## 2016-01-07 DIAGNOSIS — N2581 Secondary hyperparathyroidism of renal origin: Secondary | ICD-10-CM | POA: Diagnosis not present

## 2016-01-10 DIAGNOSIS — N186 End stage renal disease: Secondary | ICD-10-CM | POA: Diagnosis not present

## 2016-01-10 DIAGNOSIS — E119 Type 2 diabetes mellitus without complications: Secondary | ICD-10-CM | POA: Diagnosis not present

## 2016-01-10 DIAGNOSIS — N2581 Secondary hyperparathyroidism of renal origin: Secondary | ICD-10-CM | POA: Diagnosis not present

## 2016-01-10 DIAGNOSIS — D631 Anemia in chronic kidney disease: Secondary | ICD-10-CM | POA: Diagnosis not present

## 2016-01-10 DIAGNOSIS — D509 Iron deficiency anemia, unspecified: Secondary | ICD-10-CM | POA: Diagnosis not present

## 2016-01-11 DIAGNOSIS — Z5181 Encounter for therapeutic drug level monitoring: Secondary | ICD-10-CM | POA: Diagnosis not present

## 2016-01-12 DIAGNOSIS — N186 End stage renal disease: Secondary | ICD-10-CM | POA: Diagnosis not present

## 2016-01-12 DIAGNOSIS — E119 Type 2 diabetes mellitus without complications: Secondary | ICD-10-CM | POA: Diagnosis not present

## 2016-01-12 DIAGNOSIS — D631 Anemia in chronic kidney disease: Secondary | ICD-10-CM | POA: Diagnosis not present

## 2016-01-12 DIAGNOSIS — D509 Iron deficiency anemia, unspecified: Secondary | ICD-10-CM | POA: Diagnosis not present

## 2016-01-12 DIAGNOSIS — N2581 Secondary hyperparathyroidism of renal origin: Secondary | ICD-10-CM | POA: Diagnosis not present

## 2016-01-16 DIAGNOSIS — Z5181 Encounter for therapeutic drug level monitoring: Secondary | ICD-10-CM | POA: Diagnosis not present

## 2016-01-17 DIAGNOSIS — N186 End stage renal disease: Secondary | ICD-10-CM | POA: Diagnosis not present

## 2016-01-17 DIAGNOSIS — E119 Type 2 diabetes mellitus without complications: Secondary | ICD-10-CM | POA: Diagnosis not present

## 2016-01-17 DIAGNOSIS — D631 Anemia in chronic kidney disease: Secondary | ICD-10-CM | POA: Diagnosis not present

## 2016-01-17 DIAGNOSIS — D509 Iron deficiency anemia, unspecified: Secondary | ICD-10-CM | POA: Diagnosis not present

## 2016-01-17 DIAGNOSIS — N2581 Secondary hyperparathyroidism of renal origin: Secondary | ICD-10-CM | POA: Diagnosis not present

## 2016-01-19 ENCOUNTER — Emergency Department (HOSPITAL_COMMUNITY)
Admission: EM | Admit: 2016-01-19 | Discharge: 2016-01-19 | Disposition: A | Discharge status: Home / Self Care | Payer: Medicare Other | Attending: Emergency Medicine | Admitting: Emergency Medicine

## 2016-01-19 ENCOUNTER — Encounter (HOSPITAL_COMMUNITY): Payer: Self-pay

## 2016-01-19 ENCOUNTER — Emergency Department (HOSPITAL_COMMUNITY): Payer: Medicare Other

## 2016-01-19 DIAGNOSIS — M25551 Pain in right hip: Secondary | ICD-10-CM | POA: Diagnosis not present

## 2016-01-19 DIAGNOSIS — Z86010 Personal history of colonic polyps: Secondary | ICD-10-CM | POA: Diagnosis not present

## 2016-01-19 DIAGNOSIS — Z8611 Personal history of tuberculosis: Secondary | ICD-10-CM | POA: Diagnosis not present

## 2016-01-19 DIAGNOSIS — Z87891 Personal history of nicotine dependence: Secondary | ICD-10-CM | POA: Insufficient documentation

## 2016-01-19 DIAGNOSIS — Z9981 Dependence on supplemental oxygen: Secondary | ICD-10-CM | POA: Insufficient documentation

## 2016-01-19 DIAGNOSIS — J209 Acute bronchitis, unspecified: Secondary | ICD-10-CM | POA: Diagnosis not present

## 2016-01-19 DIAGNOSIS — I509 Heart failure, unspecified: Secondary | ICD-10-CM | POA: Insufficient documentation

## 2016-01-19 DIAGNOSIS — E119 Type 2 diabetes mellitus without complications: Secondary | ICD-10-CM | POA: Diagnosis not present

## 2016-01-19 DIAGNOSIS — J40 Bronchitis, not specified as acute or chronic: Secondary | ICD-10-CM

## 2016-01-19 DIAGNOSIS — R011 Cardiac murmur, unspecified: Secondary | ICD-10-CM | POA: Diagnosis not present

## 2016-01-19 DIAGNOSIS — Z8719 Personal history of other diseases of the digestive system: Secondary | ICD-10-CM | POA: Diagnosis not present

## 2016-01-19 DIAGNOSIS — Z7901 Long term (current) use of anticoagulants: Secondary | ICD-10-CM | POA: Diagnosis not present

## 2016-01-19 DIAGNOSIS — D509 Iron deficiency anemia, unspecified: Secondary | ICD-10-CM | POA: Diagnosis not present

## 2016-01-19 DIAGNOSIS — R05 Cough: Secondary | ICD-10-CM | POA: Diagnosis present

## 2016-01-19 DIAGNOSIS — N186 End stage renal disease: Secondary | ICD-10-CM | POA: Diagnosis not present

## 2016-01-19 DIAGNOSIS — E785 Hyperlipidemia, unspecified: Secondary | ICD-10-CM | POA: Diagnosis not present

## 2016-01-19 DIAGNOSIS — D649 Anemia, unspecified: Secondary | ICD-10-CM | POA: Insufficient documentation

## 2016-01-19 DIAGNOSIS — I120 Hypertensive chronic kidney disease with stage 5 chronic kidney disease or end stage renal disease: Secondary | ICD-10-CM | POA: Diagnosis not present

## 2016-01-19 DIAGNOSIS — Z9889 Other specified postprocedural states: Secondary | ICD-10-CM | POA: Diagnosis not present

## 2016-01-19 DIAGNOSIS — Z87448 Personal history of other diseases of urinary system: Secondary | ICD-10-CM | POA: Insufficient documentation

## 2016-01-19 DIAGNOSIS — Z79899 Other long term (current) drug therapy: Secondary | ICD-10-CM | POA: Diagnosis not present

## 2016-01-19 DIAGNOSIS — R066 Hiccough: Secondary | ICD-10-CM | POA: Diagnosis not present

## 2016-01-19 DIAGNOSIS — D631 Anemia in chronic kidney disease: Secondary | ICD-10-CM | POA: Diagnosis not present

## 2016-01-19 DIAGNOSIS — N2581 Secondary hyperparathyroidism of renal origin: Secondary | ICD-10-CM | POA: Diagnosis not present

## 2016-01-19 DIAGNOSIS — J811 Chronic pulmonary edema: Secondary | ICD-10-CM | POA: Diagnosis not present

## 2016-01-19 DIAGNOSIS — Z7982 Long term (current) use of aspirin: Secondary | ICD-10-CM | POA: Insufficient documentation

## 2016-01-19 LAB — COMPREHENSIVE METABOLIC PANEL
ALBUMIN: 3.6 g/dL (ref 3.5–5.0)
ALT: 11 U/L — ABNORMAL LOW (ref 17–63)
ANION GAP: 17 — AB (ref 5–15)
AST: 16 U/L (ref 15–41)
Alkaline Phosphatase: 77 U/L (ref 38–126)
BILIRUBIN TOTAL: 0.7 mg/dL (ref 0.3–1.2)
BUN: 22 mg/dL — ABNORMAL HIGH (ref 6–20)
CO2: 27 mmol/L (ref 22–32)
Calcium: 8.4 mg/dL — ABNORMAL LOW (ref 8.9–10.3)
Chloride: 96 mmol/L — ABNORMAL LOW (ref 101–111)
Creatinine, Ser: 7.41 mg/dL — ABNORMAL HIGH (ref 0.61–1.24)
GFR calc Af Amer: 8 mL/min — ABNORMAL LOW (ref >60)
GFR, EST NON AFRICAN AMERICAN: 7 mL/min — AB (ref >60)
Glucose, Bld: 81 mg/dL (ref 65–99)
POTASSIUM: 4.1 mmol/L (ref 3.5–5.1)
Sodium: 140 mmol/L (ref 135–145)
TOTAL PROTEIN: 8 g/dL (ref 6.5–8.1)

## 2016-01-19 LAB — CBC WITH DIFFERENTIAL/PLATELET
BASOS ABS: 0 10*3/uL (ref 0.0–0.1)
BASOS PCT: 0 %
Eosinophils Absolute: 0.4 10*3/uL (ref 0.0–0.7)
Eosinophils Relative: 6 %
HEMATOCRIT: 32.2 % — AB (ref 39.0–52.0)
HEMOGLOBIN: 9.5 g/dL — AB (ref 13.0–17.0)
Lymphocytes Relative: 7 %
Lymphs Abs: 0.5 10*3/uL — ABNORMAL LOW (ref 0.7–4.0)
MCH: 23.8 pg — ABNORMAL LOW (ref 26.0–34.0)
MCHC: 29.5 g/dL — ABNORMAL LOW (ref 30.0–36.0)
MCV: 80.7 fL (ref 78.0–100.0)
Monocytes Absolute: 0.8 10*3/uL (ref 0.1–1.0)
Monocytes Relative: 12 %
NEUTROS ABS: 5.4 10*3/uL (ref 1.7–7.7)
NEUTROS PCT: 75 %
Platelets: 160 10*3/uL (ref 150–400)
RBC: 3.99 MIL/uL — ABNORMAL LOW (ref 4.22–5.81)
RDW: 19.6 % — ABNORMAL HIGH (ref 11.5–15.5)
WBC: 7.2 10*3/uL (ref 4.0–10.5)

## 2016-01-19 LAB — BRAIN NATRIURETIC PEPTIDE: B Natriuretic Peptide: 866.3 pg/mL — ABNORMAL HIGH (ref 0.0–100.0)

## 2016-01-19 LAB — MAGNESIUM: MAGNESIUM: 2.1 mg/dL (ref 1.7–2.4)

## 2016-01-19 LAB — TROPONIN I: TROPONIN I: 0.03 ng/mL (ref <0.031)

## 2016-01-19 MED ORDER — BENZONATATE 100 MG PO CAPS
200.0000 mg | ORAL_CAPSULE | Freq: Once | ORAL | Status: AC
  Administered 2016-01-19: 200 mg via ORAL
  Filled 2016-01-19: qty 2

## 2016-01-19 MED ORDER — ALBUTEROL SULFATE HFA 108 (90 BASE) MCG/ACT IN AERS: 1.0000 | INHALATION_SPRAY | Freq: Four times a day (QID) | RESPIRATORY_TRACT | Status: DC | PRN

## 2016-01-19 MED ORDER — IPRATROPIUM-ALBUTEROL 0.5-2.5 (3) MG/3ML IN SOLN
3.0000 mL | Freq: Once | RESPIRATORY_TRACT | Status: AC
  Administered 2016-01-19: 3 mL via RESPIRATORY_TRACT
  Filled 2016-01-19: qty 3

## 2016-01-19 MED ORDER — GUAIFENESIN-CODEINE 100-10 MG/5ML PO SOLN: 5.0000 mL | Freq: Three times a day (TID) | ORAL | Status: DC | PRN

## 2016-01-19 MED ORDER — HYDROCODONE-ACETAMINOPHEN 5-325 MG PO TABS
2.0000 | ORAL_TABLET | Freq: Once | ORAL | Status: AC
  Administered 2016-01-19: 2 via ORAL
  Filled 2016-01-19: qty 2

## 2016-01-19 MED ORDER — PREDNISONE 20 MG PO TABS
60.0000 mg | ORAL_TABLET | Freq: Once | ORAL | Status: AC
  Administered 2016-01-19: 60 mg via ORAL
  Filled 2016-01-19: qty 3

## 2016-01-19 MED ORDER — PREDNISONE 20 MG PO TABS: 40.0000 mg | ORAL_TABLET | Freq: Every day | ORAL | Status: DC

## 2016-01-19 NOTE — ED Notes (Signed)
Patient oxygen levels maintained at 96% however did not ambulate well due to c/c of hip pain

## 2016-01-19 NOTE — ED Provider Notes (Signed)
CSN: [id number]     Arrival date & time 01/19/16  1035 History   First MD Initiated Contact with Patient 01/19/16 1103     No chief complaint on file.    (Consider location/radiation/quality/duration/timing/severity/associated sxs/prior Treatment) HPI   [name] [name] is a 60 y.o. male with history of diabetes, CAD, hypertension, dHF, HLD, ESRD - T/Th/Sa, last dialyzed today, he is on 2 L home oxygen over the past 7 months, however he does not know why, he presents to the emergency department today with dry cough x 2-3 weeks, recently worsening, with associated central chest "soreness from coughing" and SOB with coughing fits.  He normally can walk up 2 flights of stairs to his apartment but has exertional dyspnea after walking short distances and intermittent wheeze. He denies fever, but endorses hot and cold chills.  He denies chest pain at rest, lower extremity edema, palpitations, near syncope, weakness, orthopnea. He states that he is near his dry weight. At dialysis today he did not experience any near-syncope or shortness of breath. He states that when he gets dialyzed) dry weight he does have some muscle cramps which he did experience earlier today.  He has no other complaints.  Past Medical History  Diagnosis Date  . Hypertension   . Hematochezia     a. 2014: colonscopy, which showed moderately-sized internal hemorrhoids, two 56mm polyps in transverse colon and ascending colon that were resected, five 2-57mm polyps in sigmoid colon, descending colon, transverse colon, and ascending colon that were resected. An upper endoscopy was performed and showed normal esophagus, stomach, and duodenum.  . Hematuria     a. H/o hematuria 2014 with cystoscopy that was unrevealing for his source of hematuria. He underwent a kidney ultrasound on 10/14 that showed mildly echogenic and scarred kidneys compatible with medical renal disease, without hydronephrosis or renal calculi.  [name] Anemia   . CAD (coronary  artery disease)     a. per [facility] s/p 3.38mm x 23mm Vision BMS to mid LAD 12/2009 and Xience DES to mid LAD 10/2010.  . Colon polyps   . Chronic diastolic CHF (congestive heart failure) ([place])   . Hyperlipidemia   . Anginal pain ([place])   . Heart murmur   . 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 Type II diabetes mellitus ([place])   . History of blood transfusion     "had colonoscopy done; they had to give me some blood"  . Daily headache   . ESRD on dialysis [facility]) since ~ 2008    "Page; TTS" (07/21/2015)  . On home oxygen therapy     "2L prn" (07/21/2015)  . Renal insufficiency    Past Surgical History  Procedure Laterality Date  . Left heart catheterization with coronary angiogram N/A 11/23/2014    Procedure: LEFT HEART CATHETERIZATION WITH CORONARY ANGIOGRAM;  Surgeon: Troy Sine, MD;  Location: [facility] CATH LAB;  Service: Cardiovascular;  Laterality: N/A;  . Lithotripsy  X1  . Cystoscopy w/ stone manipulation  X2?  . Cardiac catheterization  "several"  . Coronary angioplasty with stent placement  "several"  . Eye surgery Bilateral     "laser OR for hemorrhage"  . Av fistula placement Left ~ 2007    "upper arm"   Family History  Problem Relation Age of Onset  . Hypertension    . Bone cancer Mother   . Anuerysm Father   . Diabetes type II Daughter  Social History  Substance Use Topics  . Smoking status: Former Smoker -- 0.50 packs/day for 8 years    Types: Cigarettes    Quit date: 12/06/2010  . Smokeless tobacco: Never Used  . Alcohol Use: No    Review of Systems  All other systems reviewed and are negative.     Allergies  Enalapril  Home Medications   Prior to Admission medications   Medication Sig Start Date End Date Taking? Authorizing Provider  amitriptyline (ELAVIL) 100 MG tablet Take 1 tablet (100 mg total) by mouth at bedtime. 07/27/15  Yes Shela Leff, MD  aspirin 81 MG chewable tablet Chew 1 tablet (81 mg  total) by mouth daily. 07/22/15  Yes Iline Oven, MD  atorvastatin (LIPITOR) 20 MG tablet Take 20 mg by mouth at bedtime.    Yes Historical Provider, MD  carvedilol (COREG) 25 MG tablet Take 2 tablets (50 mg total) by mouth 2 (two) times daily. 10/30/15  Yes Ripudeep Krystal Eaton, MD  cinacalcet (SENSIPAR) 90 MG tablet Take 90 mg by mouth at bedtime.    Yes Historical Provider, MD  clopidogrel (PLAVIX) 75 MG tablet Take 1 tablet (75 mg total) by mouth daily. 11/24/14  Yes Juluis Mire, MD  Darbepoetin Alfa (ARANESP) 25 MCG/0.42ML SOSY injection Inject 0.42 mLs (25 mcg total) into the vein every Tuesday with hemodialysis. 12/15/14  Yes Geradine Girt, DO  dextromethorphan-guaiFENesin (TUSSIN DM) 10-100 MG/5ML liquid Take 10 mLs by mouth every 4 (four) hours as needed for cough. 01/03/16  Yes Thurnell Lose, MD  doxercalciferol (HECTOROL) 4 MCG/2ML injection Inject 1 mL (2 mcg total) into the vein Every Tuesday,Thursday,and Saturday with dialysis. 12/15/14  Yes Geradine Girt, DO  ferric gluconate 62.5 mg in sodium chloride 0.9 % 100 mL Inject 62.5 mg into the vein every Thursday with hemodialysis. 12/16/14  Yes Geradine Girt, DO  losartan (COZAAR) 100 MG tablet Take 100 mg by mouth daily.   Yes Historical Provider, MD  meclizine (ANTIVERT) 25 MG tablet Take 25 mg by mouth 3 (three) times daily as needed for dizziness.   Yes Historical Provider, MD  nitroGLYCERIN (NITROSTAT) 0.4 MG SL tablet Place 1 tablet (0.4 mg total) under the tongue every 5 (five) minutes as needed for chest pain. 06/05/15  Yes Janece Canterbury, MD  pregabalin (LYRICA) 50 MG capsule Take 1 capsule (50 mg total) by mouth daily. 12/15/14  Yes Geradine Girt, DO  sevelamer carbonate (RENVELA) 800 MG tablet Take 2,400 mg by mouth 3 (three) times daily with meals.    Yes Historical Provider, MD  simethicone (GAS-X) 80 MG chewable tablet Chew 1 tablet (80 mg total) by mouth every 6 (six) hours as needed for flatulence. 08/29/15  Yes Collier Salina, MD  sucroferric oxyhydroxide (VELPHORO) 500 MG chewable tablet Chew 1,000 mg by mouth 3 (three) times daily with meals.   Yes Historical Provider, MD  venlafaxine XR (EFFEXOR XR) 75 MG 24 hr capsule Take 1 capsule (75 mg total) by mouth daily with breakfast. 07/27/15  Yes Shela Leff, MD  albuterol (PROVENTIL HFA;VENTOLIN HFA) 108 (90 Base) MCG/ACT inhaler Inhale 1-2 puffs into the lungs every 6 (six) hours as needed for wheezing or shortness of breath. 01/19/16   Delsa Grana, PA-C  amLODipine (NORVASC) 10 MG tablet Take 1 tablet (10 mg total) by mouth daily. Patient not taking: Reported on 01/19/2016 10/30/15   Ripudeep Krystal Eaton, MD  guaiFENesin-codeine 100-10 MG/5ML syrup Take 5 mLs by mouth 3 (  three) times daily as needed for cough. 01/19/16   Delsa Grana, PA-C  isosorbide mononitrate (IMDUR) 30 MG 24 hr tablet Take 1 tablet (30 mg total) by mouth daily. Patient not taking: Reported on 01/19/2016 10/30/15   Ripudeep Krystal Eaton, MD  multivitamin (RENA-VIT) TABS tablet Take 1 tablet by mouth at bedtime. Patient not taking: Reported on 01/01/2016 12/15/14   Geradine Girt, DO  pantoprazole (PROTONIX) 40 MG tablet Take 1 tablet (40 mg total) by mouth daily. Patient not taking: Reported on 01/19/2016 06/05/15   Janece Canterbury, MD  predniSONE (DELTASONE) 20 MG tablet Take 2 tablets (40 mg total) by mouth daily. 01/19/16   Delsa Grana, PA-C   BP 157/102 mmHg  Pulse 107  Temp(Src) 97.9 F (36.6 C) (Oral)  Resp 22  Ht 6' (1.829 m)  Wt 91.173 kg  BMI 27.25 kg/m2  SpO2 97% Physical Exam  Constitutional: He is oriented to person, place, and time. He appears well-developed and well-nourished. No distress.  Chronically ill-appearing male, NAD  HENT:  Head: Normocephalic and atraumatic.  Nose: Nose normal.  Mouth/Throat: Oropharynx is clear and moist. No oropharyngeal exudate.  Eyes: Conjunctivae and EOM are normal. Pupils are equal, round, and reactive to light. Right eye exhibits no discharge. Left eye  exhibits no discharge. No scleral icterus.  Neck: Normal range of motion. No JVD present. No tracheal deviation present. No thyromegaly present.  Cardiovascular: Normal rate, regular rhythm, normal heart sounds and intact distal pulses.  Exam reveals no gallop and no friction rub.   No murmur heard. Pulmonary/Chest: Effort normal. No respiratory distress. He has no wheezes. He has rales. He exhibits no tenderness.  Course Rales bilaterally at the bases, no wheeze, no rhonchi. No respiratory distress. Frequent cough  Abdominal: Soft. Bowel sounds are normal. He exhibits no distension and no mass. There is no tenderness. There is no rebound and no guarding.  Musculoskeletal: Normal range of motion. He exhibits tenderness.  Right hip tenderness, normal ROM  Lymphadenopathy:    He has no cervical adenopathy.  Neurological: He is alert and oriented to person, place, and time. He has normal reflexes. No cranial nerve deficit. He exhibits normal muscle tone. Coordination normal.  Skin: Skin is warm and dry. No rash noted. He is not diaphoretic. No erythema. No pallor.  Psychiatric: He has a normal mood and affect. His behavior is normal. Judgment and thought content normal.  Nursing note and vitals reviewed.   ED Course  Procedures (including critical care time) Labs Review Labs Reviewed  COMPREHENSIVE METABOLIC PANEL - Abnormal; Notable for the following:    Chloride 96 (*)    BUN 22 (*)    Creatinine, Ser 7.41 (*)    Calcium 8.4 (*)    ALT 11 (*)    GFR calc non Af Amer 7 (*)    GFR calc Af Amer 8 (*)    Anion gap 17 (*)    All other components within normal limits  CBC WITH DIFFERENTIAL/PLATELET - Abnormal; Notable for the following:    RBC 3.99 (*)    Hemoglobin 9.5 (*)    HCT 32.2 (*)    MCH 23.8 (*)    MCHC 29.5 (*)    RDW 19.6 (*)    Lymphs Abs 0.5 (*)    All other components within normal limits  BRAIN NATRIURETIC PEPTIDE - Abnormal; Notable for the following:    B  Natriuretic Peptide 866.3 (*)    All other components within normal limits  MAGNESIUM  TROPONIN I    Imaging Review Dg Chest 2 View  01/19/2016  CLINICAL DATA:  Cough for 3-4 weeks EXAM: CHEST  2 VIEW COMPARISON:  01/01/2016 FINDINGS: Chronic cardiomegaly. A coronary stent is noted. Interstitial coarsening which is near prior baseline and favored bronchitic. There is no definitive edema, consolidation, effusion, or pneumothorax. IMPRESSION: Chronic interstitial coarsening, favored bronchitic rather than congestive. No change since 01/01/2016. Electronically Signed   By: Monte Fantasia M.D.   On: 01/19/2016 13:14   Dg Hip Unilat With Pelvis 2-3 Views Right  01/19/2016  CLINICAL DATA:  [place] on right hip.  Pain. EXAM: DG HIP (WITH OR WITHOUT PELVIS) 2-3V RIGHT COMPARISON:  None. FINDINGS: There is no evidence of hip fracture or dislocation. There is no evidence of arthropathy or other focal bone abnormality. Vascular calcification. Seminal vesicle calcification compatible with diabetes. IMPRESSION: Negative for fracture. Electronically Signed   By: Franchot Gallo M.D.   On: 01/19/2016 13:03   I have personally reviewed and evaluated these images and lab results as part of my medical decision-making.   EKG Interpretation None      MDM   Dialysis pt with 2-3 weeks of non-productive cough.  Labs, BNP, trop, EKG, CXR obtained.  Pt reportedly also fell on the floor after falling asleep on the side of the ER gurney, he states he fell yesterday on his hip as well, requested right hip xray, which was negative. Pt's xray pertinent for chronic bronchitic changes, no significant change from recent chest x-ray when patient was admitted for fluid overload and pulmonary edema.  Patient's BNP is significantly decreased from prior admission.  He is maintaining oxygen saturation at rest and maintained at 96% with ambulation, he does not appear to have acutely decompensated congestive heart failure.  Case was  discussed with Dr. Kathrynn Humble, who agrees to treat bronchitis with steroids breathing treatments.    Final diagnoses:  Bronchitis        Delsa Grana, PA-C 01/24/16 1254  Varney Biles, MD 01/25/16 1328

## 2016-01-19 NOTE — Discharge Instructions (Signed)
How to Use an Inhaler Proper inhaler technique is very important. Good technique ensures that the medicine reaches the lungs. Poor technique results in depositing the medicine on the tongue and back of the throat rather than in the airways. If you do not use the inhaler with good technique, the medicine will not help you. STEPS TO FOLLOW IF USING AN INHALER WITHOUT AN EXTENSION TUBE 1. Remove the cap from the inhaler. 2. If you are using the inhaler for the first time, you will need to prime it. Shake the inhaler for 5 seconds and release four puffs into the air, away from your face. Ask your health care provider or pharmacist if you have questions about priming your inhaler. 3. Shake the inhaler for 5 seconds before each breath in (inhalation). 4. Position the inhaler so that the top of the canister faces up. 5. Put your index finger on the top of the medicine canister. Your thumb supports the bottom of the inhaler. 6. Open your mouth. 7. Either place the inhaler between your teeth and place your lips tightly around the mouthpiece, or hold the inhaler 1-2 inches away from your open mouth. If you are unsure of which technique to use, ask your health care provider. 8. Breathe out (exhale) normally and as completely as possible. 9. Press the canister down with your index finger to release the medicine. 10. At the same time as the canister is pressed, inhale deeply and slowly until your lungs are completely filled. This should take 4-6 seconds. Keep your tongue down. 11. Hold the medicine in your lungs for 5-10 seconds (10 seconds is best). This helps the medicine get into the small airways of your lungs. 12. Breathe out slowly, through pursed lips. Whistling is an example of pursed lips. 13. Wait at least 15-30 seconds between puffs. Continue with the above steps until you have taken the number of puffs your health care provider has ordered. Do not use the inhaler more than your health care provider  tells you. 14. Replace the cap on the inhaler. 15. Follow the directions from your health care provider or the inhaler insert for cleaning the inhaler. STEPS TO FOLLOW IF USING AN INHALER WITH AN EXTENSION (SPACER) 1. Remove the cap from the inhaler. 2. If you are using the inhaler for the first time, you will need to prime it. Shake the inhaler for 5 seconds and release four puffs into the air, away from your face. Ask your health care provider or pharmacist if you have questions about priming your inhaler. 3. Shake the inhaler for 5 seconds before each breath in (inhalation). 4. Place the open end of the spacer onto the mouthpiece of the inhaler. 5. Position the inhaler so that the top of the canister faces up and the spacer mouthpiece faces you. 6. Put your index finger on the top of the medicine canister. Your thumb supports the bottom of the inhaler and the spacer. 7. Breathe out (exhale) normally and as completely as possible. 8. Immediately after exhaling, place the spacer between your teeth and into your mouth. Close your lips tightly around the spacer. 9. Press the canister down with your index finger to release the medicine. 10. At the same time as the canister is pressed, inhale deeply and slowly until your lungs are completely filled. This should take 4-6 seconds. Keep your tongue down and out of the way. 11. Hold the medicine in your lungs for 5-10 seconds (10 seconds is best). This helps the  medicine get into the small airways of your lungs. Exhale. 12. Repeat inhaling deeply through the spacer mouthpiece. Again hold that breath for up to 10 seconds (10 seconds is best). Exhale slowly. If it is difficult to take this second deep breath through the spacer, breathe normally several times through the spacer. Remove the spacer from your mouth. 13. Wait at least 15-30 seconds between puffs. Continue with the above steps until you have taken the number of puffs your health care provider has  ordered. Do not use the inhaler more than your health care provider tells you. 14. Remove the spacer from the inhaler, and place the cap on the inhaler. 15. Follow the directions from your health care provider or the inhaler insert for cleaning the inhaler and spacer. If you are using different kinds of inhalers, use your quick relief medicine to open the airways 10-15 minutes before using a steroid if instructed to do so by your health care provider. If you are unsure which inhalers to use and the order of using them, ask your health care provider, nurse, or respiratory therapist. If you are using a steroid inhaler, always rinse your mouth with water after your last puff, then gargle and spit out the water. Do not swallow the water. AVOID:  Inhaling before or after starting the spray of medicine. It takes practice to coordinate your breathing with triggering the spray.  Inhaling through the nose (rather than the mouth) when triggering the spray. HOW TO DETERMINE IF YOUR INHALER IS FULL OR NEARLY EMPTY You cannot know when an inhaler is empty by shaking it. A few inhalers are now being made with dose counters. Ask your health care provider for a prescription that has a dose counter if you feel you need that extra help. If your inhaler does not have a counter, ask your health care provider to help you determine the date you need to refill your inhaler. Write the refill date on a calendar or your inhaler canister. Refill your inhaler 7-10 days before it runs out. Be sure to keep an adequate supply of medicine. This includes making sure it is not expired, and that you have a spare inhaler.  SEEK MEDICAL CARE IF:   Your symptoms are only partially relieved with your inhaler.  You are having trouble using your inhaler.  You have some increase in phlegm. SEEK IMMEDIATE MEDICAL CARE IF:   You feel little or no relief with your inhalers. You are still wheezing and are feeling shortness of breath or  tightness in your chest or both.  You have dizziness, headaches, or a fast heart rate.  You have chills, fever, or night sweats.  You have a noticeable increase in phlegm production, or there is blood in the phlegm. MAKE SURE YOU:   Understand these instructions.  Will watch your condition.  Will get help right away if you are not doing well or get worse.   This information is not intended to replace advice given to you by your health care provider. Make sure you discuss any questions you have with your health care provider.   Document Released: 09/21/2000 Document Revised: 07/15/2013 Document Reviewed: 04/23/2013 Elsevier Interactive Patient Education 2016 Else[employer] Inc[employer] Acute Bronchitis Bronchitis is inflammation of the airways that extend from the windpipe into the lungs (bronchi). The inflammation often causes mucus to develop. This leads to a cough, which is the most common symptom of bronchitis.  In acute bronchitis, the condition usually develops suddenly and goes away  over time, usually in a couple weeks. Smoking, allergies, and asthma can make bronchitis worse. Repeated episodes of bronchitis may cause further lung problems.  CAUSES Acute bronchitis is most often caused by the same virus that causes a cold. The virus can spread from person to person (contagious) through coughing, sneezing, and touching contaminated objects. SIGNS AND SYMPTOMS  16. Cough.  17. Fever.  18. Coughing up mucus.  19. Body aches.  20. Chest congestion.  21. Chills.  22. Shortness of breath.  23. Sore throat.  DIAGNOSIS  Acute bronchitis is usually diagnosed through a physical exam. Your health care provider will also ask you questions about your medical history. Tests, such as chest X-rays, are sometimes done to rule out other conditions.  TREATMENT  Acute bronchitis usually goes away in a couple weeks. Oftentimes, no medical treatment is necessary. Medicines are sometimes given for  relief of fever or cough. Antibiotic medicines are usually not needed but may be prescribed in certain situations. In some cases, an inhaler may be recommended to help reduce shortness of breath and control the cough. A cool mist vaporizer may also be used to help thin bronchial secretions and make it easier to clear the chest.  HOME CARE INSTRUCTIONS 16. Get plenty of rest.  17. Drink enough fluids to keep your urine clear or pale yellow (unless you have a medical condition that requires fluid restriction). Increasing fluids may help thin your respiratory secretions (sputum) and reduce chest congestion, and it will prevent dehydration.  18. Take medicines only as directed by your health care provider. 19. If you were prescribed an antibiotic medicine, finish it all even if you start to feel better. 20. Avoid smoking and secondhand smoke. Exposure to cigarette smoke or irritating chemicals will make bronchitis worse. If you are a smoker, consider using nicotine gum or skin patches to help control withdrawal symptoms. Quitting smoking will help your lungs heal faster.  21. Reduce the chances of another bout of acute bronchitis by washing your hands frequently, avoiding people with cold symptoms, and trying not to touch your hands to your mouth, nose, or eyes.  74. Keep all follow-up visits as directed by your health care provider.  SEEK MEDICAL CARE IF: Your symptoms do not improve after 1 week of treatment.  SEEK IMMEDIATE MEDICAL CARE IF:  You develop an increased fever or chills.   You have chest pain.   You have severe shortness of breath.  You have bloody sputum.   You develop dehydration.  You faint or repeatedly feel like you are going to pass out.  You develop repeated vomiting.  You develop a severe headache. MAKE SURE YOU:   Understand these instructions.  Will watch your condition.  Will get help right away if you are not doing well or get worse.   This  information is not intended to replace advice given to you by your health care provider. Make sure you discuss any questions you have with your health care provider.   Document Released: 11/01/2004 Document Revised: 10/15/2014 Document Reviewed: 03/17/2013 Elsevier Interactive Patient Education [employer].

## 2016-01-19 NOTE — ED Notes (Signed)
This NS was at the nurses desk, when pt's door opened and he said "I need help, I fell on the floor." This NS, Erlene Quan RN, and [employer] RN went into pt's room to help him get back into bed. We asked him how he fell on the floor, he said he got to edge of bed, and fell asleep. Stated he fell on right hip and is sore. Both side rails were up on bed.

## 2016-01-19 NOTE — ED Notes (Signed)
Pt presents with 2 week h/o dry cough.  Pt denies fever, reports shortness of breath when he coughs.  Pt received HD today , reports becoming dizzy after treatment, falling in the lobby onto R buttock.

## 2016-01-19 NOTE — ED Notes (Signed)
Places pt's dentures in a tray. Placed pt sticker on them and put them in pt's black duffle bag, requested per pt. Philippa Chester, phlebotomist wax present.

## 2016-01-21 DIAGNOSIS — D509 Iron deficiency anemia, unspecified: Secondary | ICD-10-CM | POA: Diagnosis not present

## 2016-01-21 DIAGNOSIS — E119 Type 2 diabetes mellitus without complications: Secondary | ICD-10-CM | POA: Diagnosis not present

## 2016-01-21 DIAGNOSIS — D631 Anemia in chronic kidney disease: Secondary | ICD-10-CM | POA: Diagnosis not present

## 2016-01-21 DIAGNOSIS — N2581 Secondary hyperparathyroidism of renal origin: Secondary | ICD-10-CM | POA: Diagnosis not present

## 2016-01-21 DIAGNOSIS — N186 End stage renal disease: Secondary | ICD-10-CM | POA: Diagnosis not present

## 2016-01-24 DIAGNOSIS — E119 Type 2 diabetes mellitus without complications: Secondary | ICD-10-CM | POA: Diagnosis not present

## 2016-01-24 DIAGNOSIS — D509 Iron deficiency anemia, unspecified: Secondary | ICD-10-CM | POA: Diagnosis not present

## 2016-01-24 DIAGNOSIS — D631 Anemia in chronic kidney disease: Secondary | ICD-10-CM | POA: Diagnosis not present

## 2016-01-24 DIAGNOSIS — N2581 Secondary hyperparathyroidism of renal origin: Secondary | ICD-10-CM | POA: Diagnosis not present

## 2016-01-24 DIAGNOSIS — N186 End stage renal disease: Secondary | ICD-10-CM | POA: Diagnosis not present

## 2016-01-25 DIAGNOSIS — Z5181 Encounter for therapeutic drug level monitoring: Secondary | ICD-10-CM | POA: Diagnosis not present

## 2016-01-26 DIAGNOSIS — D509 Iron deficiency anemia, unspecified: Secondary | ICD-10-CM | POA: Diagnosis not present

## 2016-01-26 DIAGNOSIS — E119 Type 2 diabetes mellitus without complications: Secondary | ICD-10-CM | POA: Diagnosis not present

## 2016-01-26 DIAGNOSIS — N186 End stage renal disease: Secondary | ICD-10-CM | POA: Diagnosis not present

## 2016-01-26 DIAGNOSIS — N2581 Secondary hyperparathyroidism of renal origin: Secondary | ICD-10-CM | POA: Diagnosis not present

## 2016-01-26 DIAGNOSIS — D631 Anemia in chronic kidney disease: Secondary | ICD-10-CM | POA: Diagnosis not present

## 2016-01-27 DIAGNOSIS — Z5181 Encounter for therapeutic drug level monitoring: Secondary | ICD-10-CM | POA: Diagnosis not present

## 2016-01-30 DIAGNOSIS — Z5181 Encounter for therapeutic drug level monitoring: Secondary | ICD-10-CM | POA: Diagnosis not present

## 2016-01-31 DIAGNOSIS — N2581 Secondary hyperparathyroidism of renal origin: Secondary | ICD-10-CM | POA: Diagnosis not present

## 2016-01-31 DIAGNOSIS — N186 End stage renal disease: Secondary | ICD-10-CM | POA: Diagnosis not present

## 2016-01-31 DIAGNOSIS — D509 Iron deficiency anemia, unspecified: Secondary | ICD-10-CM | POA: Diagnosis not present

## 2016-01-31 DIAGNOSIS — D631 Anemia in chronic kidney disease: Secondary | ICD-10-CM | POA: Diagnosis not present

## 2016-01-31 DIAGNOSIS — E119 Type 2 diabetes mellitus without complications: Secondary | ICD-10-CM | POA: Diagnosis not present

## 2016-02-01 DIAGNOSIS — Z5181 Encounter for therapeutic drug level monitoring: Secondary | ICD-10-CM | POA: Diagnosis not present

## 2016-02-02 DIAGNOSIS — N186 End stage renal disease: Secondary | ICD-10-CM | POA: Diagnosis not present

## 2016-02-02 DIAGNOSIS — E119 Type 2 diabetes mellitus without complications: Secondary | ICD-10-CM | POA: Diagnosis not present

## 2016-02-02 DIAGNOSIS — D509 Iron deficiency anemia, unspecified: Secondary | ICD-10-CM | POA: Diagnosis not present

## 2016-02-02 DIAGNOSIS — D631 Anemia in chronic kidney disease: Secondary | ICD-10-CM | POA: Diagnosis not present

## 2016-02-02 DIAGNOSIS — N2581 Secondary hyperparathyroidism of renal origin: Secondary | ICD-10-CM | POA: Diagnosis not present

## 2016-02-04 DIAGNOSIS — D509 Iron deficiency anemia, unspecified: Secondary | ICD-10-CM | POA: Diagnosis not present

## 2016-02-04 DIAGNOSIS — D631 Anemia in chronic kidney disease: Secondary | ICD-10-CM | POA: Diagnosis not present

## 2016-02-04 DIAGNOSIS — N186 End stage renal disease: Secondary | ICD-10-CM | POA: Diagnosis not present

## 2016-02-04 DIAGNOSIS — E119 Type 2 diabetes mellitus without complications: Secondary | ICD-10-CM | POA: Diagnosis not present

## 2016-02-04 DIAGNOSIS — N2581 Secondary hyperparathyroidism of renal origin: Secondary | ICD-10-CM | POA: Diagnosis not present

## 2016-02-05 DIAGNOSIS — N186 End stage renal disease: Secondary | ICD-10-CM | POA: Diagnosis not present

## 2016-02-05 DIAGNOSIS — Z992 Dependence on renal dialysis: Secondary | ICD-10-CM | POA: Diagnosis not present

## 2016-02-05 DIAGNOSIS — E1122 Type 2 diabetes mellitus with diabetic chronic kidney disease: Secondary | ICD-10-CM | POA: Diagnosis not present

## 2016-02-07 DIAGNOSIS — D509 Iron deficiency anemia, unspecified: Secondary | ICD-10-CM | POA: Diagnosis not present

## 2016-02-07 DIAGNOSIS — N2581 Secondary hyperparathyroidism of renal origin: Secondary | ICD-10-CM | POA: Diagnosis not present

## 2016-02-07 DIAGNOSIS — E119 Type 2 diabetes mellitus without complications: Secondary | ICD-10-CM | POA: Diagnosis not present

## 2016-02-07 DIAGNOSIS — N186 End stage renal disease: Secondary | ICD-10-CM | POA: Diagnosis not present

## 2016-02-07 DIAGNOSIS — D631 Anemia in chronic kidney disease: Secondary | ICD-10-CM | POA: Diagnosis not present

## 2016-02-08 DIAGNOSIS — Z5181 Encounter for therapeutic drug level monitoring: Secondary | ICD-10-CM | POA: Diagnosis not present

## 2016-02-09 DIAGNOSIS — N2581 Secondary hyperparathyroidism of renal origin: Secondary | ICD-10-CM | POA: Diagnosis not present

## 2016-02-09 DIAGNOSIS — E119 Type 2 diabetes mellitus without complications: Secondary | ICD-10-CM | POA: Diagnosis not present

## 2016-02-09 DIAGNOSIS — N186 End stage renal disease: Secondary | ICD-10-CM | POA: Diagnosis not present

## 2016-02-09 DIAGNOSIS — D631 Anemia in chronic kidney disease: Secondary | ICD-10-CM | POA: Diagnosis not present

## 2016-02-09 DIAGNOSIS — D509 Iron deficiency anemia, unspecified: Secondary | ICD-10-CM | POA: Diagnosis not present

## 2016-02-11 DIAGNOSIS — D631 Anemia in chronic kidney disease: Secondary | ICD-10-CM | POA: Diagnosis not present

## 2016-02-11 DIAGNOSIS — N186 End stage renal disease: Secondary | ICD-10-CM | POA: Diagnosis not present

## 2016-02-11 DIAGNOSIS — N2581 Secondary hyperparathyroidism of renal origin: Secondary | ICD-10-CM | POA: Diagnosis not present

## 2016-02-11 DIAGNOSIS — D509 Iron deficiency anemia, unspecified: Secondary | ICD-10-CM | POA: Diagnosis not present

## 2016-02-11 DIAGNOSIS — E119 Type 2 diabetes mellitus without complications: Secondary | ICD-10-CM | POA: Diagnosis not present

## 2016-02-13 ENCOUNTER — Emergency Department (HOSPITAL_COMMUNITY)
Admission: EM | Admit: 2016-02-13 | Discharge: 2016-02-13 | Disposition: A | Discharge status: Home / Self Care | Payer: Medicare Other | Attending: Emergency Medicine | Admitting: Emergency Medicine

## 2016-02-13 ENCOUNTER — Emergency Department (HOSPITAL_COMMUNITY): Payer: Medicare Other

## 2016-02-13 ENCOUNTER — Encounter (HOSPITAL_COMMUNITY): Payer: Self-pay

## 2016-02-13 DIAGNOSIS — M79603 Pain in arm, unspecified: Secondary | ICD-10-CM | POA: Diagnosis not present

## 2016-02-13 DIAGNOSIS — Z87891 Personal history of nicotine dependence: Secondary | ICD-10-CM | POA: Diagnosis not present

## 2016-02-13 DIAGNOSIS — R011 Cardiac murmur, unspecified: Secondary | ICD-10-CM | POA: Insufficient documentation

## 2016-02-13 DIAGNOSIS — Z7952 Long term (current) use of systemic steroids: Secondary | ICD-10-CM | POA: Insufficient documentation

## 2016-02-13 DIAGNOSIS — E119 Type 2 diabetes mellitus without complications: Secondary | ICD-10-CM | POA: Insufficient documentation

## 2016-02-13 DIAGNOSIS — Z862 Personal history of diseases of the blood and blood-forming organs and certain disorders involving the immune mechanism: Secondary | ICD-10-CM | POA: Insufficient documentation

## 2016-02-13 DIAGNOSIS — E785 Hyperlipidemia, unspecified: Secondary | ICD-10-CM | POA: Insufficient documentation

## 2016-02-13 DIAGNOSIS — Z992 Dependence on renal dialysis: Secondary | ICD-10-CM | POA: Diagnosis not present

## 2016-02-13 DIAGNOSIS — R51 Headache: Secondary | ICD-10-CM | POA: Diagnosis not present

## 2016-02-13 DIAGNOSIS — R079 Chest pain, unspecified: Secondary | ICD-10-CM | POA: Diagnosis not present

## 2016-02-13 DIAGNOSIS — Z9861 Coronary angioplasty status: Secondary | ICD-10-CM | POA: Insufficient documentation

## 2016-02-13 DIAGNOSIS — Z8611 Personal history of tuberculosis: Secondary | ICD-10-CM | POA: Insufficient documentation

## 2016-02-13 DIAGNOSIS — N186 End stage renal disease: Secondary | ICD-10-CM | POA: Diagnosis not present

## 2016-02-13 DIAGNOSIS — Z86010 Personal history of colonic polyps: Secondary | ICD-10-CM | POA: Insufficient documentation

## 2016-02-13 DIAGNOSIS — T148 Other injury of unspecified body region: Secondary | ICD-10-CM | POA: Diagnosis not present

## 2016-02-13 DIAGNOSIS — I5032 Chronic diastolic (congestive) heart failure: Secondary | ICD-10-CM | POA: Insufficient documentation

## 2016-02-13 DIAGNOSIS — Z8719 Personal history of other diseases of the digestive system: Secondary | ICD-10-CM | POA: Insufficient documentation

## 2016-02-13 DIAGNOSIS — Z9981 Dependence on supplemental oxygen: Secondary | ICD-10-CM | POA: Diagnosis not present

## 2016-02-13 DIAGNOSIS — S0990XA Unspecified injury of head, initial encounter: Secondary | ICD-10-CM | POA: Diagnosis not present

## 2016-02-13 DIAGNOSIS — Z79899 Other long term (current) drug therapy: Secondary | ICD-10-CM | POA: Insufficient documentation

## 2016-02-13 DIAGNOSIS — I25119 Atherosclerotic heart disease of native coronary artery with unspecified angina pectoris: Secondary | ICD-10-CM | POA: Diagnosis not present

## 2016-02-13 DIAGNOSIS — R55 Syncope and collapse: Secondary | ICD-10-CM

## 2016-02-13 DIAGNOSIS — Z7902 Long term (current) use of antithrombotics/antiplatelets: Secondary | ICD-10-CM | POA: Diagnosis not present

## 2016-02-13 DIAGNOSIS — Z9889 Other specified postprocedural states: Secondary | ICD-10-CM | POA: Diagnosis not present

## 2016-02-13 DIAGNOSIS — I120 Hypertensive chronic kidney disease with stage 5 chronic kidney disease or end stage renal disease: Secondary | ICD-10-CM | POA: Diagnosis not present

## 2016-02-13 DIAGNOSIS — Z7982 Long term (current) use of aspirin: Secondary | ICD-10-CM | POA: Diagnosis not present

## 2016-02-13 LAB — BASIC METABOLIC PANEL
ANION GAP: 19 — AB (ref 5–15)
BUN: 58 mg/dL — AB (ref 6–20)
CHLORIDE: 99 mmol/L — AB (ref 101–111)
CO2: 22 mmol/L (ref 22–32)
Calcium: 9.5 mg/dL (ref 8.9–10.3)
Creatinine, Ser: 10.75 mg/dL — ABNORMAL HIGH (ref 0.61–1.24)
GFR calc Af Amer: 5 mL/min — ABNORMAL LOW (ref >60)
GFR, EST NON AFRICAN AMERICAN: 5 mL/min — AB (ref >60)
GLUCOSE: 138 mg/dL — AB (ref 65–99)
POTASSIUM: 4.4 mmol/L (ref 3.5–5.1)
Sodium: 140 mmol/L (ref 135–145)

## 2016-02-13 LAB — CBC
HEMATOCRIT: 35.3 % — AB (ref 39.0–52.0)
HEMOGLOBIN: 10.5 g/dL — AB (ref 13.0–17.0)
MCH: 24.1 pg — ABNORMAL LOW (ref 26.0–34.0)
MCHC: 29.7 g/dL — AB (ref 30.0–36.0)
MCV: 81.1 fL (ref 78.0–100.0)
Platelets: 224 10*3/uL (ref 150–400)
RBC: 4.35 MIL/uL (ref 4.22–5.81)
RDW: 21.6 % — AB (ref 11.5–15.5)
WBC: 9.4 10*3/uL (ref 4.0–10.5)

## 2016-02-13 MED ORDER — SODIUM CHLORIDE 0.9 % IV BOLUS (SEPSIS): 1000.0000 mL | Freq: Once | INTRAVENOUS | Status: DC

## 2016-02-13 NOTE — Discharge Instructions (Signed)
Chronic Kidney Disease Chronic kidney disease happens when the kidneys are damaged over a long period. The kidneys are two organs that do many important jobs in the body. These jobs include:  Removing wastes and extra fluids from the blood.  Making hormones that help to keep the body healthy.  Making sure that the body has the right amount of fluids and chemicals. Chronic kidney disease may be caused by many things. The kidney damage occurs slowly. If too much damage occurs, the kidneys may stop working the way that they should. This is dangerous. Treatment can help to slow down the damage and keep it from getting worse. HOME CARE  Follow your diet as told by your doctor. You may need to limit the amount of salt (sodium) and protein that you eat each day.  Take medicines only as told by your doctor. Do not take any new medicines unless your doctor approves it.  Quit smoking if you smoke. Talk to your doctor about programs that may help you quit smoking.  Have your blood pressure checked regularly and keep track of the results.  Start or keep doing an exercise plan.  Get shots (immunizations) as told by your doctor.  Take vitamins and minerals as told by your doctor.  Keep all follow-up visits as told by your doctor. This is important. GET HELP RIGHT AWAY IF:   Your symptoms get worse.  You have new symptoms.  You have symptoms of end-stage kidney disease. These include:  Headaches.  Skin that is darker or lighter than normal.  Numbness in the hands or feet.  Easy bruising.  Frequent hiccups.  Stopping of menstrual periods in women.  You have a fever.  You are making very little pee (urine).  You have pain or bleeding when you pee.   This information is not intended to replace advice given to you by your health care provider. Make sure you discuss any questions you have with your health care provider.   Document Released: 12/19/2009 Document Revised: 06/15/2015  Document Reviewed: 05/23/2012 Elsevier Interactive Patient Education 2016 Elsevier Inc.  

## 2016-02-13 NOTE — ED Provider Notes (Signed)
CSN: [id number]     Arrival date & time 02/13/16  1632 History   First MD Initiated Contact with Patient 02/13/16 1652     Chief Complaint  Patient presents with  . Near Syncope  PT HERE WITH SYNCOPE VIA EMS.  THE PT IS A DIALYSIS PT (DIALYSIS TUES, THURS, SAT).  HE HAD HIS USUAL DIALYSIS ON Saturday (MAY 6).  THE PT SAID THAT SINCE THEN, HE HAS HAD A FEW NEAR SYNCOPAL EVENTS.  HE FEELS A PAIN IN THE BACK OF HIS HEAD, THEN PASSES OUT. PT SAID THAT HE FEELS FINE NOW.   (Consider location/radiation/quality/duration/timing/severity/associated sxs/prior Treatment) Patient is a 60 y.o. male presenting with near-syncope. The history is provided by the patient and the EMS personnel.  Near Syncope This is a new problem. The current episode started less than 1 hour ago. Associated symptoms include headaches.    Past Medical History  Diagnosis Date  . Hypertension   . Hematochezia     a. 2014: colonscopy, which showed moderately-sized internal hemorrhoids, two 57mm polyps in transverse colon and ascending colon that were resected, five 2-61mm polyps in sigmoid colon, descending colon, transverse colon, and ascending colon that were resected. An upper endoscopy was performed and showed normal esophagus, stomach, and duodenum.  . Hematuria     a. H/o hematuria 2014 with cystoscopy that was unrevealing for his source of hematuria. He underwent a kidney ultrasound on 10/14 that showed mildly echogenic and scarred kidneys compatible with medical renal disease, without hydronephrosis or renal calculi.  [name] Anemia   . CAD (coronary artery disease)     a. per [facility] s/p 3.72mm x 44mm Vision BMS to mid LAD 12/2009 and Xience DES to mid LAD 10/2010.  . Colon polyps   . Chronic diastolic CHF (congestive heart failure) ([place])   . Hyperlipidemia   . Anginal pain ([place])   . Heart murmur   . 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 Type II diabetes mellitus ([place])   . History of blood  transfusion     "had colonoscopy done; they had to give me some blood"  . Daily headache   . ESRD on dialysis [facility]) since ~ 2008    "[place]; TTS" (07/21/2015)  . On home oxygen therapy     "2L prn" (07/21/2015)  . Renal insufficiency    Past Surgical History  Procedure Laterality Date  . Left heart catheterization with coronary angiogram N/A 11/23/2014    Procedure: LEFT HEART CATHETERIZATION WITH CORONARY ANGIOGRAM;  Surgeon: Troy Sine, MD;  Location: [facility] CATH LAB;  Service: Cardiovascular;  Laterality: N/A;  . Lithotripsy  X1  . Cystoscopy w/ stone manipulation  X2?  . Cardiac catheterization  "several"  . Coronary angioplasty with stent placement  "several"  . Eye surgery Bilateral     "laser OR for hemorrhage"  . Av fistula placement Left ~ 2007    "upper arm"   Family History  Problem Relation Age of Onset  . Hypertension    . Bone cancer Mother   . Anuerysm Father   . Diabetes type II Daughter    Social History  Substance Use Topics  . Smoking status: Former Smoker -- 0.50 packs/day for 8 years    Types: Cigarettes    Quit date: 12/06/2010  . Smokeless tobacco: Never Used  . Alcohol Use: No    Review of Systems  Cardiovascular: Positive for near-syncope.  Neurological: Positive  for headaches.  All other systems reviewed and are negative.     Allergies  Enalapril  Home Medications   Prior to Admission medications   Medication Sig Start Date End Date Taking? Authorizing Provider  albuterol (PROVENTIL HFA;VENTOLIN HFA) 108 (90 Base) MCG/ACT inhaler Inhale 1-2 puffs into the lungs every 6 (six) hours as needed for wheezing or shortness of breath. 01/19/16  Yes Delsa Grana, PA-C  amitriptyline (ELAVIL) 100 MG tablet Take 1 tablet (100 mg total) by mouth at bedtime. 07/27/15  Yes Shela Leff, MD  amLODipine (NORVASC) 10 MG tablet Take 1 tablet (10 mg total) by mouth daily. 10/30/15  Yes Ripudeep Krystal Eaton, MD  aspirin 81 MG chewable tablet Chew 1 tablet  (81 mg total) by mouth daily. 07/22/15  Yes Iline Oven, MD  atorvastatin (LIPITOR) 20 MG tablet Take 20 mg by mouth at bedtime.    Yes Historical Provider, MD  carvedilol (COREG) 25 MG tablet Take 2 tablets (50 mg total) by mouth 2 (two) times daily. 10/30/15  Yes Ripudeep Krystal Eaton, MD  cinacalcet (SENSIPAR) 90 MG tablet Take 90 mg by mouth at bedtime.    Yes Historical Provider, MD  clopidogrel (PLAVIX) 75 MG tablet Take 1 tablet (75 mg total) by mouth daily. 11/24/14  Yes Marjan Rabbani, MD  losartan (COZAAR) 100 MG tablet Take 100 mg by mouth daily.   Yes Historical Provider, MD  multivitamin (RENA-VIT) TABS tablet Take 1 tablet by mouth at bedtime. 12/15/14  Yes Geradine Girt, DO  nitroGLYCERIN (NITROSTAT) 0.4 MG SL tablet Place 1 tablet (0.4 mg total) under the tongue every 5 (five) minutes as needed for chest pain. 06/05/15  Yes Janece Canterbury, MD  pantoprazole (PROTONIX) 40 MG tablet Take 1 tablet (40 mg total) by mouth daily. 06/05/15  Yes Janece Canterbury, MD  predniSONE (DELTASONE) 20 MG tablet Take 2 tablets (40 mg total) by mouth daily. 01/19/16  Yes Delsa Grana, PA-C  pregabalin (LYRICA) 50 MG capsule Take 1 capsule (50 mg total) by mouth daily. 12/15/14  Yes Geradine Girt, DO  sevelamer carbonate (RENVELA) 800 MG tablet Take 2,400 mg by mouth 3 (three) times daily with meals.    Yes Historical Provider, MD  sucroferric oxyhydroxide (VELPHORO) 500 MG chewable tablet Chew 1,000 mg by mouth 3 (three) times daily with meals.   Yes Historical Provider, MD  venlafaxine XR (EFFEXOR XR) 75 MG 24 hr capsule Take 1 capsule (75 mg total) by mouth daily with breakfast. 07/27/15  Yes Shela Leff, MD  Darbepoetin Alfa (ARANESP) 25 MCG/0.42ML SOSY injection Inject 0.42 mLs (25 mcg total) into the vein every Tuesday with hemodialysis. 12/15/14   Geradine Girt, DO  dextromethorphan-guaiFENesin (TUSSIN DM) 10-100 MG/5ML liquid Take 10 mLs by mouth every 4 (four) hours as needed for cough. 01/03/16    Thurnell Lose, MD  doxercalciferol (HECTOROL) 4 MCG/2ML injection Inject 1 mL (2 mcg total) into the vein Every Tuesday,Thursday,and Saturday with dialysis. 12/15/14   Geradine Girt, DO  ferric gluconate 62.5 mg in sodium chloride 0.9 % 100 mL Inject 62.5 mg into the vein every Thursday with hemodialysis. 12/16/14   Geradine Girt, DO  guaiFENesin-codeine 100-10 MG/5ML syrup Take 5 mLs by mouth 3 (three) times daily as needed for cough. 01/19/16   Delsa Grana, PA-C  isosorbide mononitrate (IMDUR) 30 MG 24 hr tablet Take 1 tablet (30 mg total) by mouth daily. Patient not taking: Reported on 01/19/2016 10/30/15   Ripudeep Krystal Eaton, MD  simethicone (GAS-X) 80 MG chewable tablet Chew 1 tablet (80 mg total) by mouth every 6 (six) hours as needed for flatulence. 08/29/15   Collier Salina, MD   BP 176/113 mmHg  Pulse 95  Temp(Src) 98.2 F (36.8 C) (Oral)  Resp 24  SpO2 94% Physical Exam  Constitutional: He is oriented to person, place, and time. He appears well-developed and well-nourished.  HENT:  Head: Normocephalic and atraumatic.  Right Ear: External ear normal.  Left Ear: External ear normal.  Mouth/Throat: Oropharynx is clear and moist.  Eyes: Conjunctivae are normal. Pupils are equal, round, and reactive to light.  Neck: Normal range of motion. Neck supple.  Cardiovascular: Normal rate, regular rhythm, normal heart sounds and intact distal pulses.   Pulmonary/Chest: Effort normal and breath sounds normal.  Abdominal: Soft. Bowel sounds are normal.  Musculoskeletal: Normal range of motion.       Arms: Neurological: He is alert and oriented to person, place, and time.  Skin: Skin is warm and dry.  Psychiatric: He has a normal mood and affect. His behavior is normal. Judgment and thought content normal.  Nursing note and vitals reviewed.   ED Course  Procedures (including critical care time) Labs Review Labs Reviewed  BASIC METABOLIC PANEL - Abnormal; Notable for the following:     Chloride 99 (*)    Glucose, Bld 138 (*)    BUN 58 (*)    Creatinine, Ser 10.75 (*)    GFR calc non Af Amer 5 (*)    GFR calc Af Amer 5 (*)    Anion gap 19 (*)    All other components within normal limits  CBC - Abnormal; Notable for the following:    Hemoglobin 10.5 (*)    HCT 35.3 (*)    MCH 24.1 (*)    MCHC 29.7 (*)    RDW 21.6 (*)    All other components within normal limits  CBG MONITORING, ED    Imaging Review Dg Chest 2 View  02/13/2016  CLINICAL DATA:  [place] getting out of bed. Losing consciousness. Right chest pain and right shoulder pain. EXAM: CHEST  2 VIEW COMPARISON:  01/19/2016 FINDINGS: Heart size is mildly enlarged but similar to the previous examination. Lungs are clear without airspace disease, pulmonary edema or pneumothorax. Patient is mildly rotated towards the right. No large pleural effusions. Bony thorax appears to be intact. IMPRESSION: No acute cardiopulmonary disease. Electronically Signed   By: Markus Daft M.D.   On: 02/13/2016 17:24   Ct Head Wo Contrast  02/13/2016  CLINICAL DATA:  60 year old male with unwitnessed fall, on heparin. Initial encounter. EXAM: CT HEAD WITHOUT CONTRAST TECHNIQUE: Contiguous axial images were obtained from the base of the skull through the vertex without intravenous contrast. COMPARISON:  10/30/2015 noncontrast head CT.  Brain MRI 10/28/2015. FINDINGS: Visualized paranasal sinuses and mastoids are clear. Mild right parietal convexity scalp soft tissue scarring again noted. No acute scalp hematoma identified. Visualized orbit soft tissues are within normal limits. Calvarium intact. No acute osseous abnormality identified. Calcified atherosclerosis at the skull base. Cerebral white matter changes re- demonstrated and appears stable from the January MRI. Stable gray-white matter differentiation. No cortically based acute infarct identified. No midline shift, mass effect, or evidence of intracranial mass lesion. No acute intracranial hemorrhage  identified. No ventriculomegaly. No suspicious intracranial vascular hyperdensity. IMPRESSION: No acute intracranial abnormality. Stable nonspecific cerebral white matter signal changes since the brain MRI in January. Electronically Signed   By: Lemmie Evens  [place] Crane M.D.   On: 02/13/2016 18:18   I have personally reviewed and evaluated these images and lab results as part of my medical decision-making.   EKG Interpretation   Date/Time:  Monday Feb 13 2016 16:39:43 EDT Ventricular Rate:  93 PR Interval:  150 QRS Duration: 99 QT Interval:  380 QTC Calculation: 473 R Axis:   66 Text Interpretation:  Sinus rhythm Probable left atrial enlargement  Confirmed by Linell Shawn MD, Lakya Schrupp ([id number]) on 02/13/2016 5:06:40 PM      MDM  PT IS FEELING BETTER.  HE KNOWS TO RETURN IF WORSE.  HE KNOWS TO GO TO DIALYSIS TOMORROW AS SCHEDULED. Final diagnoses:  Near syncope  ESRD (end stage renal disease) on dialysis [facility])      Isla Pence, MD 02/13/16 2351

## 2016-02-13 NOTE — ED Notes (Signed)
PER EMS: pt here for syncopal episode today around 1600. He was laying in the bed, stood up and fell. Pt states he blacked out "for a minute." Denies feeling lightheaded prior to fall. He states he had a sharp pain in the back of his head but had been going on for a week. CBG-110. BP-170/110, HR-96. A&Ox4, speaking clear complete sentences. Pt reports his right arm hurts when he moves his right arm. His last dialysis on Saturday.

## 2016-02-14 DIAGNOSIS — N2581 Secondary hyperparathyroidism of renal origin: Secondary | ICD-10-CM | POA: Diagnosis not present

## 2016-02-14 DIAGNOSIS — N186 End stage renal disease: Secondary | ICD-10-CM | POA: Diagnosis not present

## 2016-02-14 DIAGNOSIS — D631 Anemia in chronic kidney disease: Secondary | ICD-10-CM | POA: Diagnosis not present

## 2016-02-14 DIAGNOSIS — D509 Iron deficiency anemia, unspecified: Secondary | ICD-10-CM | POA: Diagnosis not present

## 2016-02-14 DIAGNOSIS — E119 Type 2 diabetes mellitus without complications: Secondary | ICD-10-CM | POA: Diagnosis not present

## 2016-02-16 DIAGNOSIS — D631 Anemia in chronic kidney disease: Secondary | ICD-10-CM | POA: Diagnosis not present

## 2016-02-16 DIAGNOSIS — N186 End stage renal disease: Secondary | ICD-10-CM | POA: Diagnosis not present

## 2016-02-16 DIAGNOSIS — D509 Iron deficiency anemia, unspecified: Secondary | ICD-10-CM | POA: Diagnosis not present

## 2016-02-16 DIAGNOSIS — N2581 Secondary hyperparathyroidism of renal origin: Secondary | ICD-10-CM | POA: Diagnosis not present

## 2016-02-16 DIAGNOSIS — E119 Type 2 diabetes mellitus without complications: Secondary | ICD-10-CM | POA: Diagnosis not present

## 2016-02-18 DIAGNOSIS — E119 Type 2 diabetes mellitus without complications: Secondary | ICD-10-CM | POA: Diagnosis not present

## 2016-02-18 DIAGNOSIS — D631 Anemia in chronic kidney disease: Secondary | ICD-10-CM | POA: Diagnosis not present

## 2016-02-18 DIAGNOSIS — D509 Iron deficiency anemia, unspecified: Secondary | ICD-10-CM | POA: Diagnosis not present

## 2016-02-18 DIAGNOSIS — N186 End stage renal disease: Secondary | ICD-10-CM | POA: Diagnosis not present

## 2016-02-18 DIAGNOSIS — N2581 Secondary hyperparathyroidism of renal origin: Secondary | ICD-10-CM | POA: Diagnosis not present

## 2016-02-19 ENCOUNTER — Emergency Department (HOSPITAL_COMMUNITY): Payer: Medicare Other

## 2016-02-19 ENCOUNTER — Encounter (HOSPITAL_COMMUNITY): Payer: Self-pay | Admitting: Emergency Medicine

## 2016-02-19 ENCOUNTER — Inpatient Hospital Stay (HOSPITAL_COMMUNITY)
Admission: EM | Admit: 2016-02-19 | Discharge: 2016-02-21 | DRG: 291 | Disposition: A | Discharge status: Home / Self Care | Payer: Medicare Other | Attending: Family Medicine | Admitting: Family Medicine

## 2016-02-19 DIAGNOSIS — I5023 Acute on chronic systolic (congestive) heart failure: Secondary | ICD-10-CM | POA: Diagnosis present

## 2016-02-19 DIAGNOSIS — F329 Major depressive disorder, single episode, unspecified: Secondary | ICD-10-CM | POA: Diagnosis present

## 2016-02-19 DIAGNOSIS — Z79899 Other long term (current) drug therapy: Secondary | ICD-10-CM

## 2016-02-19 DIAGNOSIS — Z8639 Personal history of other endocrine, nutritional and metabolic disease: Secondary | ICD-10-CM

## 2016-02-19 DIAGNOSIS — D649 Anemia, unspecified: Secondary | ICD-10-CM | POA: Diagnosis present

## 2016-02-19 DIAGNOSIS — Z888 Allergy status to other drugs, medicaments and biological substances status: Secondary | ICD-10-CM

## 2016-02-19 DIAGNOSIS — N186 End stage renal disease: Secondary | ICD-10-CM

## 2016-02-19 DIAGNOSIS — I2510 Atherosclerotic heart disease of native coronary artery without angina pectoris: Secondary | ICD-10-CM | POA: Diagnosis present

## 2016-02-19 DIAGNOSIS — F32A Depression, unspecified: Secondary | ICD-10-CM | POA: Diagnosis present

## 2016-02-19 DIAGNOSIS — N250 Renal osteodystrophy: Secondary | ICD-10-CM | POA: Diagnosis present

## 2016-02-19 DIAGNOSIS — Z87891 Personal history of nicotine dependence: Secondary | ICD-10-CM

## 2016-02-19 DIAGNOSIS — Z9111 Patient's noncompliance with dietary regimen: Secondary | ICD-10-CM

## 2016-02-19 DIAGNOSIS — E785 Hyperlipidemia, unspecified: Secondary | ICD-10-CM | POA: Diagnosis present

## 2016-02-19 DIAGNOSIS — I132 Hypertensive heart and chronic kidney disease with heart failure and with stage 5 chronic kidney disease, or end stage renal disease: Principal | ICD-10-CM | POA: Diagnosis present

## 2016-02-19 DIAGNOSIS — J42 Unspecified chronic bronchitis: Secondary | ICD-10-CM | POA: Diagnosis present

## 2016-02-19 DIAGNOSIS — Z992 Dependence on renal dialysis: Secondary | ICD-10-CM

## 2016-02-19 DIAGNOSIS — Z9981 Dependence on supplemental oxygen: Secondary | ICD-10-CM | POA: Diagnosis not present

## 2016-02-19 DIAGNOSIS — R0902 Hypoxemia: Secondary | ICD-10-CM | POA: Diagnosis present

## 2016-02-19 DIAGNOSIS — Z86010 Personal history of colonic polyps: Secondary | ICD-10-CM

## 2016-02-19 DIAGNOSIS — R531 Weakness: Secondary | ICD-10-CM | POA: Diagnosis not present

## 2016-02-19 DIAGNOSIS — I10 Essential (primary) hypertension: Secondary | ICD-10-CM | POA: Diagnosis present

## 2016-02-19 DIAGNOSIS — E1122 Type 2 diabetes mellitus with diabetic chronic kidney disease: Secondary | ICD-10-CM | POA: Diagnosis not present

## 2016-02-19 DIAGNOSIS — R0600 Dyspnea, unspecified: Secondary | ICD-10-CM | POA: Diagnosis not present

## 2016-02-19 DIAGNOSIS — K219 Gastro-esophageal reflux disease without esophagitis: Secondary | ICD-10-CM | POA: Diagnosis present

## 2016-02-19 DIAGNOSIS — Z7982 Long term (current) use of aspirin: Secondary | ICD-10-CM

## 2016-02-19 DIAGNOSIS — Z7902 Long term (current) use of antithrombotics/antiplatelets: Secondary | ICD-10-CM

## 2016-02-19 DIAGNOSIS — J811 Chronic pulmonary edema: Secondary | ICD-10-CM | POA: Diagnosis present

## 2016-02-19 DIAGNOSIS — R0602 Shortness of breath: Secondary | ICD-10-CM | POA: Diagnosis not present

## 2016-02-19 DIAGNOSIS — R0789 Other chest pain: Secondary | ICD-10-CM | POA: Diagnosis not present

## 2016-02-19 LAB — BASIC METABOLIC PANEL
ANION GAP: 18 — AB (ref 5–15)
BUN: 51 mg/dL — ABNORMAL HIGH (ref 6–20)
CHLORIDE: 101 mmol/L (ref 101–111)
CO2: 22 mmol/L (ref 22–32)
CREATININE: 8.32 mg/dL — AB (ref 0.61–1.24)
Calcium: 9 mg/dL (ref 8.9–10.3)
GFR calc non Af Amer: 6 mL/min — ABNORMAL LOW (ref >60)
GFR, EST AFRICAN AMERICAN: 7 mL/min — AB (ref >60)
Glucose, Bld: 147 mg/dL — ABNORMAL HIGH (ref 65–99)
POTASSIUM: 3.7 mmol/L (ref 3.5–5.1)
SODIUM: 141 mmol/L (ref 135–145)

## 2016-02-19 LAB — CBC
HEMATOCRIT: 34.7 % — AB (ref 39.0–52.0)
HEMOGLOBIN: 10.3 g/dL — AB (ref 13.0–17.0)
MCH: 24.1 pg — ABNORMAL LOW (ref 26.0–34.0)
MCHC: 29.7 g/dL — ABNORMAL LOW (ref 30.0–36.0)
MCV: 81.1 fL (ref 78.0–100.0)
PLATELETS: 139 10*3/uL — AB (ref 150–400)
RBC: 4.28 MIL/uL (ref 4.22–5.81)
RDW: 21.9 % — ABNORMAL HIGH (ref 11.5–15.5)
WBC: 10 10*3/uL (ref 4.0–10.5)

## 2016-02-19 LAB — I-STAT TROPONIN, ED: Troponin i, poc: 0.05 ng/mL (ref 0.00–0.08)

## 2016-02-19 LAB — BRAIN NATRIURETIC PEPTIDE: B Natriuretic Peptide: 1970.6 pg/mL — ABNORMAL HIGH (ref 0.0–100.0)

## 2016-02-19 MED ORDER — HEPARIN SODIUM (PORCINE) 5000 UNIT/ML IJ SOLN
5000.0000 [IU] | Freq: Three times a day (TID) | INTRAMUSCULAR | Status: DC
  Administered 2016-02-20: 5000 [IU] via SUBCUTANEOUS
  Filled 2016-02-19: qty 1

## 2016-02-19 MED ORDER — VENLAFAXINE HCL ER 75 MG PO CP24
75.0000 mg | ORAL_CAPSULE | Freq: Every day | ORAL | Status: DC
  Administered 2016-02-20 – 2016-02-21 (×2): 75 mg via ORAL
  Filled 2016-02-19 (×2): qty 1

## 2016-02-19 MED ORDER — PANTOPRAZOLE SODIUM 40 MG PO TBEC
40.0000 mg | DELAYED_RELEASE_TABLET | Freq: Every day | ORAL | Status: DC
  Administered 2016-02-20 – 2016-02-21 (×2): 40 mg via ORAL
  Filled 2016-02-19 (×2): qty 1

## 2016-02-19 MED ORDER — RENA-VITE PO TABS
1.0000 | ORAL_TABLET | Freq: Every day | ORAL | Status: DC
  Administered 2016-02-19 – 2016-02-20 (×2): 1 via ORAL
  Filled 2016-02-19 (×2): qty 1

## 2016-02-19 MED ORDER — ALBUTEROL SULFATE HFA 108 (90 BASE) MCG/ACT IN AERS: 1.0000 | INHALATION_SPRAY | RESPIRATORY_TRACT | Status: DC | PRN

## 2016-02-19 MED ORDER — CLOPIDOGREL BISULFATE 75 MG PO TABS
75.0000 mg | ORAL_TABLET | Freq: Every day | ORAL | Status: DC
  Administered 2016-02-20 – 2016-02-21 (×2): 75 mg via ORAL
  Filled 2016-02-19 (×2): qty 1

## 2016-02-19 MED ORDER — SUCROFERRIC OXYHYDROXIDE 500 MG PO CHEW
1000.0000 mg | CHEWABLE_TABLET | Freq: Three times a day (TID) | ORAL | Status: DC
  Filled 2016-02-19 (×6): qty 2

## 2016-02-19 MED ORDER — ATORVASTATIN CALCIUM 20 MG PO TABS
20.0000 mg | ORAL_TABLET | Freq: Every day | ORAL | Status: DC
  Administered 2016-02-19 – 2016-02-20 (×2): 20 mg via ORAL
  Filled 2016-02-19: qty 1
  Filled 2016-02-19: qty 2

## 2016-02-19 MED ORDER — ASPIRIN 81 MG PO CHEW
81.0000 mg | CHEWABLE_TABLET | Freq: Every day | ORAL | Status: DC
  Administered 2016-02-20 – 2016-02-21 (×2): 81 mg via ORAL
  Filled 2016-02-19 (×2): qty 1

## 2016-02-19 MED ORDER — AMITRIPTYLINE HCL 25 MG PO TABS
100.0000 mg | ORAL_TABLET | Freq: Every day | ORAL | Status: DC
  Administered 2016-02-19 – 2016-02-20 (×2): 100 mg via ORAL
  Filled 2016-02-19 (×2): qty 4

## 2016-02-19 MED ORDER — CARVEDILOL 25 MG PO TABS
50.0000 mg | ORAL_TABLET | Freq: Two times a day (BID) | ORAL | Status: DC
  Administered 2016-02-19 – 2016-02-21 (×2): 50 mg via ORAL
  Filled 2016-02-19: qty 4
  Filled 2016-02-19: qty 2

## 2016-02-19 MED ORDER — SIMETHICONE 80 MG PO CHEW: 80.0000 mg | CHEWABLE_TABLET | Freq: Four times a day (QID) | ORAL | Status: DC | PRN

## 2016-02-19 MED ORDER — NITROGLYCERIN 0.4 MG SL SUBL
0.4000 mg | SUBLINGUAL_TABLET | SUBLINGUAL | Status: DC | PRN
Start: 2016-02-19 — End: 2016-02-21

## 2016-02-19 MED ORDER — ONDANSETRON HCL 4 MG/2ML IJ SOLN: 4.0000 mg | Freq: Four times a day (QID) | INTRAMUSCULAR | Status: DC | PRN

## 2016-02-19 MED ORDER — CINACALCET HCL 30 MG PO TABS
90.0000 mg | ORAL_TABLET | Freq: Every day | ORAL | Status: DC
  Administered 2016-02-20: 90 mg via ORAL
  Filled 2016-02-19 (×3): qty 3

## 2016-02-19 MED ORDER — AMLODIPINE BESYLATE 10 MG PO TABS
10.0000 mg | ORAL_TABLET | Freq: Every day | ORAL | Status: DC
  Administered 2016-02-19 – 2016-02-21 (×2): 10 mg via ORAL
  Filled 2016-02-19: qty 2
  Filled 2016-02-19: qty 1

## 2016-02-19 MED ORDER — ONDANSETRON HCL 4 MG/2ML IJ SOLN
4.0000 mg | Freq: Once | INTRAMUSCULAR | Status: AC
  Administered 2016-02-19: 4 mg via INTRAVENOUS
  Filled 2016-02-19: qty 2

## 2016-02-19 MED ORDER — GUAIFENESIN-DM 100-10 MG/5ML PO SYRP
10.0000 mL | ORAL_SOLUTION | ORAL | Status: DC | PRN
  Administered 2016-02-20: 10 mL via ORAL
  Filled 2016-02-19: qty 10

## 2016-02-19 MED ORDER — ALBUTEROL SULFATE (2.5 MG/3ML) 0.083% IN NEBU: 2.5000 mg | INHALATION_SOLUTION | RESPIRATORY_TRACT | Status: DC | PRN

## 2016-02-19 MED ORDER — PREGABALIN 50 MG PO CAPS
50.0000 mg | ORAL_CAPSULE | Freq: Every day | ORAL | Status: DC
  Administered 2016-02-20 – 2016-02-21 (×2): 50 mg via ORAL
  Filled 2016-02-19 (×2): qty 1

## 2016-02-19 MED ORDER — SEVELAMER CARBONATE 800 MG PO TABS
2400.0000 mg | ORAL_TABLET | Freq: Three times a day (TID) | ORAL | Status: DC
  Administered 2016-02-20 – 2016-02-21 (×3): 2400 mg via ORAL
  Filled 2016-02-19 (×3): qty 3

## 2016-02-19 NOTE — H&P (Signed)
History and Physical  [name] [name] [id number] DOB: 05-09-1956 DOA: 02/19/2016  PCP:  [name]   Chief Complaint:  Dyspnea  History of Present Illness:  Patient is a 60 yo male with history of HTN, ESRD on HD who came with cc of dyspnea starting yesterday associated with cough occasionally productive of clear sputum and chest pain only with cough. He said he starting feeling like that yesterday after HD: he usually goes for 4 hours but yesterday he only had it for less than 30 minutes. He also has not been very compliant with fluid intake. No sore throat or URI symptoms. No wheezing. No fever or chills. He had nausea and vomited a few times. Vomitus is non bloody, bilious. He is on oxygen at home. He likely has chronic bronchitis and uses albuterol at home as needed.   Review of Systems:  CONSTITUTIONAL:     No night sweats.  No fatigue.  No fever. No chills. Eyes:                            No visual changes.  No eye pain.  No eye discharge.   ENT:                              No epistaxis.  No sinus pain.  No sore throat.   No congestion. RESPIRATORY:           +cough.  No wheeze.  No hemoptysis.  +dyspnea CARDIOVASCULAR   :  +chest pains.  No palpitations. GASTROINTESTINAL:  No abdominal pain.  +nausea. +vomiting.  No diarrhea. No constipation.  No hematemesis.  No hematochezia.  No melena. GENITOURINARY:      No urgency.  No frequency.  No dysuria.  No hematuria.  No obstructive symptoms.  No discharge.  No pain.   MUSCULOSKELETAL:  No musculoskeletal pain.  No joint swelling.  No arthritis. NEUROLOGICAL:        No confusion.  No weakness. No headache. No seizure. PSYCHIATRIC:             No depression. No anxiety. No suicidal ideation. SKIN:                             No rashes.  No lesions.  No wounds. ENDOCRINE:                No weight loss.  No polydipsia.  No polyuria.  No polyphagia. HEMATOLOGIC:           No purpura.  No petechiae.  No bleeding.    ALLERGIC                 : No pruritus.  No angioedema Other:  Past Medical and Surgical History:   Past Medical History  Diagnosis Date  . Hypertension   . Hematochezia     a. 2014: colonscopy, which showed moderately-sized internal hemorrhoids, two 2mm polyps in transverse colon and ascending colon that were resected, five 2-84mm polyps in sigmoid colon, descending colon, transverse colon, and ascending colon that were resected. An upper endoscopy was performed and showed normal esophagus, stomach, and duodenum.  . Hematuria     a. H/o hematuria 2014 with cystoscopy that was unrevealing for his source of hematuria. He underwent a kidney ultrasound on 10/14 that showed mildly echogenic and scarred  kidneys compatible with medical renal disease, without hydronephrosis or renal calculi.  [name] Anemia   . CAD (coronary artery disease)     a. per [facility] s/p 3.61mm x 69mm Vision BMS to mid LAD 12/2009 and Xience DES to mid LAD 10/2010.  . Colon polyps   . Chronic diastolic CHF (congestive heart failure) ([place])   . Hyperlipidemia   . Anginal pain ([place])   . Heart murmur   . 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 Type II diabetes mellitus ([place])   . History of blood transfusion     "had colonoscopy done; they had to give me some blood"  . Daily headache   . ESRD on dialysis [facility]) since ~ 2008    "[place]; TTS" (07/21/2015)  . On home oxygen therapy     "2L prn" (07/21/2015)  . Renal insufficiency    Past Surgical History  Procedure Laterality Date  . Left heart catheterization with coronary angiogram N/A 11/23/2014    Procedure: LEFT HEART CATHETERIZATION WITH CORONARY ANGIOGRAM;  Surgeon: Troy Sine, MD;  Location: [facility] CATH LAB;  Service: Cardiovascular;  Laterality: N/A;  . Lithotripsy  X1  . Cystoscopy w/ stone manipulation  X2?  . Cardiac catheterization  "several"  . Coronary angioplasty with stent placement  "several"  . Eye surgery Bilateral      "laser OR for hemorrhage"  . Av fistula placement Left ~ 2007    "upper arm"    Social History:   reports that he quit smoking about 5 years ago. His smoking use included Cigarettes. He has a 4 pack-year smoking history. He has never used smokeless tobacco. He reports that he does not drink alcohol or use illicit drugs.    Allergies  Allergen Reactions  . Enalapril Hives    Family History  Problem Relation Age of Onset  . Hypertension    . Bone cancer Mother   . Anuerysm Father   . Diabetes type II Daughter       Prior to Admission medications   Medication Sig Start Date End Date Taking? Authorizing Provider  albuterol (PROVENTIL HFA;VENTOLIN HFA) 108 (90 Base) MCG/ACT inhaler Inhale 1-2 puffs into the lungs every 6 (six) hours as needed for wheezing or shortness of breath. 01/19/16  Yes Delsa Grana, PA-C  amitriptyline (ELAVIL) 100 MG tablet Take 1 tablet (100 mg total) by mouth at bedtime. 07/27/15  Yes Shela Leff, MD  amLODipine (NORVASC) 10 MG tablet Take 1 tablet (10 mg total) by mouth daily. 10/30/15  Yes Ripudeep Krystal Eaton, MD  aspirin 81 MG chewable tablet Chew 1 tablet (81 mg total) by mouth daily. 07/22/15  Yes Iline Oven, MD  atorvastatin (LIPITOR) 20 MG tablet Take 20 mg by mouth at bedtime.    Yes Historical Provider, MD  carvedilol (COREG) 25 MG tablet Take 2 tablets (50 mg total) by mouth 2 (two) times daily. 10/30/15  Yes Ripudeep Krystal Eaton, MD  cinacalcet (SENSIPAR) 90 MG tablet Take 90 mg by mouth at bedtime.    Yes Historical Provider, MD  clopidogrel (PLAVIX) 75 MG tablet Take 1 tablet (75 mg total) by mouth daily. 11/24/14  Yes Marjan Rabbani, MD  dextromethorphan-guaiFENesin (TUSSIN DM) 10-100 MG/5ML liquid Take 10 mLs by mouth every 4 (four) hours as needed for cough. 01/03/16  Yes Thurnell Lose, MD  losartan (COZAAR) 100 MG tablet Take 100 mg by mouth daily.   Yes Historical  Provider, MD  multivitamin (RENA-VIT) TABS tablet Take 1 tablet by mouth at  bedtime. 12/15/14  Yes Geradine Girt, DO  nitroGLYCERIN (NITROSTAT) 0.4 MG SL tablet Place 1 tablet (0.4 mg total) under the tongue every 5 (five) minutes as needed for chest pain. 06/05/15  Yes Janece Canterbury, MD  pantoprazole (PROTONIX) 40 MG tablet Take 1 tablet (40 mg total) by mouth daily. 06/05/15  Yes Janece Canterbury, MD  pregabalin (LYRICA) 50 MG capsule Take 1 capsule (50 mg total) by mouth daily. 12/15/14  Yes Geradine Girt, DO  sevelamer carbonate (RENVELA) 800 MG tablet Take 2,400 mg by mouth 3 (three) times daily with meals.    Yes Historical Provider, MD  simethicone (GAS-X) 80 MG chewable tablet Chew 1 tablet (80 mg total) by mouth every 6 (six) hours as needed for flatulence. 08/29/15  Yes Collier Salina, MD  sucroferric oxyhydroxide (VELPHORO) 500 MG chewable tablet Chew 1,000 mg by mouth 3 (three) times daily with meals.   Yes Historical Provider, MD  venlafaxine XR (EFFEXOR XR) 75 MG 24 hr capsule Take 1 capsule (75 mg total) by mouth daily with breakfast. 07/27/15  Yes Shela Leff, MD  Darbepoetin Alfa (ARANESP) 25 MCG/0.42ML SOSY injection Inject 0.42 mLs (25 mcg total) into the vein every Tuesday with hemodialysis. 12/15/14   Geradine Girt, DO  doxercalciferol (HECTOROL) 4 MCG/2ML injection Inject 1 mL (2 mcg total) into the vein Every Tuesday,Thursday,and Saturday with dialysis. 12/15/14   Geradine Girt, DO  ferric gluconate 62.5 mg in sodium chloride 0.9 % 100 mL Inject 62.5 mg into the vein every Thursday with hemodialysis. 12/16/14   Geradine Girt, DO  guaiFENesin-codeine 100-10 MG/5ML syrup Take 5 mLs by mouth 3 (three) times daily as needed for cough. Patient not taking: Reported on 02/19/2016 01/19/16   Delsa Grana, PA-C  isosorbide mononitrate (IMDUR) 30 MG 24 hr tablet Take 1 tablet (30 mg total) by mouth daily. Patient not taking: Reported on 01/19/2016 10/30/15   Ripudeep Krystal Eaton, MD    Physical Exam: BP 160/110 mmHg  Pulse 106  Temp(Src) 98.8 F (37.1 C) (Oral)   Resp 29  SpO2 95%  GENERAL :   Alert and cooperative, and appears to be in no acute distress. HEAD:           normocephalic. EYES:            PERRL, EOMI.  vision is grossly intact. EARS:           hearing grossly intact. NOSE:           No nasal discharge. THROAT:     Oral cavity and pharynx normal.   NECK:          supple, non-tender. No lymphadenopathy CARDIAC:    Normal S1 and S2. No gallop. AVF hum. Vascular:     no peripheral edema. Extremities are warm and well perfused. No carotid bruits. LUNGS:       Bilateral crackles with mild wheezing  ABDOMEN: Positive bowel sounds. Soft, distended, nontender. No guarding or rebound.      MSK:           No joint erythema or tenderness. Normal muscular development. EXT           : No significant deformity or joint abnormality. Neuro        : Alert, oriented to person, place, and time.  CN II-XII intact.                      SKIN:            Left arm AVF PSYCH:       No hallucination. Patient is not suicidal.          Labs on Admission:  Reviewed.   Radiological Exams on Admission: Dg Chest 2 View  02/19/2016  CLINICAL DATA:  Shortness of breath and chest pain for 2 weeks. EXAM: CHEST  2 VIEW COMPARISON:  02/13/2016 FINDINGS: Shallow inspiration with elevation of the left hemidiaphragm. Heart size and pulmonary vascularity are normal. Central interstitial pattern to the lungs with mild bronchial wall thickening may indicate chronic bronchitis. No focal airspace disease or consolidation. No blunting of costophrenic angles. No pneumothorax. Calcification of the aorta. IMPRESSION: Central interstitial changes in the lungs possibly representing chronic bronchitis or interstitial fibrosis. No evidence of active pulmonary disease. Electronically Signed   By: Lucienne Capers M.D.   On: 02/19/2016 21:39    EKG:  Independently reviewed. Sinus tachycardia   Assessment/Plan  Dyspnea:  Likely due to volume overload/ acute on  chronic systolic HF Last Echo from 2016 with mild LV systolic function reduction  Patient has history of CHF HD in am per nephrology Currently sat is >90 on 2L [place]: pt on oxygen at home Albuterol Q4h PRN Patient likely need control meds for chronic bronchitis at home on discharge   ESRD on HD: Nephro consulted, HD in am for volume overload. Schedule is TTS  HTN: cont home meds. BP elevated, likely due to above, vomiting/not taking meds. Will resume.   Vomiting: zofran prn   Input & Output: ordered  Lines & Tubes: PIV DVT prophylaxis: [place] heparin  GI prophylaxis: PPI Consultants: Nephro Code Status: Full  Family Communication: None at bedside  Disposition Plan: Milinda Pointer M.D [facility]

## 2016-02-19 NOTE — ED Notes (Signed)
Pt states "i've drank a lot of fluids the last few days, i have pain and shortness of breath in my chest" Pt has CHF, kidney failure, pt had dialysis yesterday. Pt SOB at this time.

## 2016-02-19 NOTE — ED Provider Notes (Signed)
CSN: [id number]     Arrival date & time 02/19/16  1945 History   First MD Initiated Contact with Patient 02/19/16 2022     Chief Complaint  Patient presents with  . Chest Pain  . Shortness of Breath     (Consider location/radiation/quality/duration/timing/severity/associated sxs/prior Treatment) HPI Patient presents with concern of dyspnea, chest pain. Symptoms began yesterday, over the past 24 hours have been persistent, worsening. Patient completed a full dialysis session yesterday. He notes that over the past day he has had substantial fluid intake, but otherwise no other health changes, medication changes, diet changes. No fever. Patient is supposed to use oxygen 24/7, but has been doing so only inconsistently.  He denies confusion, disorientation.  Symptoms are provoked with minimal activity, and the chest discomfort is largely described as tightness associated with breathing difficulty.   Past Medical History  Diagnosis Date  . Hypertension   . Hematochezia     a. 2014: colonscopy, which showed moderately-sized internal hemorrhoids, two 48mm polyps in transverse colon and ascending colon that were resected, five 2-44mm polyps in sigmoid colon, descending colon, transverse colon, and ascending colon that were resected. An upper endoscopy was performed and showed normal esophagus, stomach, and duodenum.  . Hematuria     a. H/o hematuria 2014 with cystoscopy that was unrevealing for his source of hematuria. He underwent a kidney ultrasound on 10/14 that showed mildly echogenic and scarred kidneys compatible with medical renal disease, without hydronephrosis or renal calculi.  [name] Anemia   . CAD (coronary artery disease)     a. per [facility] s/p 3.22mm x 35mm Vision BMS to mid LAD 12/2009 and Xience DES to mid LAD 10/2010.  . Colon polyps   . Chronic diastolic CHF (congestive heart failure) ([place])   . Hyperlipidemia   . Anginal pain ([place])   . Heart murmur   . 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 Type II diabetes mellitus ([place])   . History of blood transfusion     "had colonoscopy done; they had to give me some blood"  . Daily headache   . ESRD on dialysis [facility]) since ~ 2008    "[place]; TTS" (07/21/2015)  . On home oxygen therapy     "2L prn" (07/21/2015)  . Renal insufficiency    Past Surgical History  Procedure Laterality Date  . Left heart catheterization with coronary angiogram N/A 11/23/2014    Procedure: LEFT HEART CATHETERIZATION WITH CORONARY ANGIOGRAM;  Surgeon: Troy Sine, MD;  Location: [facility] CATH LAB;  Service: Cardiovascular;  Laterality: N/A;  . Lithotripsy  X1  . Cystoscopy w/ stone manipulation  X2?  . Cardiac catheterization  "several"  . Coronary angioplasty with stent placement  "several"  . Eye surgery Bilateral     "laser OR for hemorrhage"  . Av fistula placement Left ~ 2007    "upper arm"   Family History  Problem Relation Age of Onset  . Hypertension    . Bone cancer Mother   . Anuerysm Father   . Diabetes type II Daughter    Social History  Substance Use Topics  . Smoking status: Former Smoker -- 0.50 packs/day for 8 years    Types: Cigarettes    Quit date: 12/06/2010  . Smokeless tobacco: Never Used  . Alcohol Use: No    Review of Systems  Constitutional:       Per HPI, otherwise negative  HENT:  Per HPI, otherwise negative  Respiratory:       Per HPI, otherwise negative  Cardiovascular:       Per HPI, otherwise negative  Gastrointestinal: Negative for vomiting.  Endocrine:       Negative aside from HPI  Genitourinary:       Neg aside from HPI   Musculoskeletal:       Per HPI, otherwise negative  Skin: Negative.   Neurological: Positive for weakness. Negative for syncope.      Allergies  Enalapril  Home Medications   Prior to Admission medications   Medication Sig Start Date End Date Taking? Authorizing Provider  albuterol (PROVENTIL HFA;VENTOLIN HFA) 108  (90 Base) MCG/ACT inhaler Inhale 1-2 puffs into the lungs every 6 (six) hours as needed for wheezing or shortness of breath. 01/19/16  Yes Delsa Grana, PA-C  amitriptyline (ELAVIL) 100 MG tablet Take 1 tablet (100 mg total) by mouth at bedtime. 07/27/15  Yes Shela Leff, MD  amLODipine (NORVASC) 10 MG tablet Take 1 tablet (10 mg total) by mouth daily. 10/30/15  Yes Ripudeep Krystal Eaton, MD  aspirin 81 MG chewable tablet Chew 1 tablet (81 mg total) by mouth daily. 07/22/15  Yes Iline Oven, MD  atorvastatin (LIPITOR) 20 MG tablet Take 20 mg by mouth at bedtime.    Yes Historical Provider, MD  carvedilol (COREG) 25 MG tablet Take 2 tablets (50 mg total) by mouth 2 (two) times daily. 10/30/15  Yes Ripudeep Krystal Eaton, MD  cinacalcet (SENSIPAR) 90 MG tablet Take 90 mg by mouth at bedtime.    Yes Historical Provider, MD  clopidogrel (PLAVIX) 75 MG tablet Take 1 tablet (75 mg total) by mouth daily. 11/24/14  Yes Marjan Rabbani, MD  dextromethorphan-guaiFENesin (TUSSIN DM) 10-100 MG/5ML liquid Take 10 mLs by mouth every 4 (four) hours as needed for cough. 01/03/16  Yes Thurnell Lose, MD  losartan (COZAAR) 100 MG tablet Take 100 mg by mouth daily.   Yes Historical Provider, MD  multivitamin (RENA-VIT) TABS tablet Take 1 tablet by mouth at bedtime. 12/15/14  Yes Geradine Girt, DO  nitroGLYCERIN (NITROSTAT) 0.4 MG SL tablet Place 1 tablet (0.4 mg total) under the tongue every 5 (five) minutes as needed for chest pain. 06/05/15  Yes Janece Canterbury, MD  pantoprazole (PROTONIX) 40 MG tablet Take 1 tablet (40 mg total) by mouth daily. 06/05/15  Yes Janece Canterbury, MD  pregabalin (LYRICA) 50 MG capsule Take 1 capsule (50 mg total) by mouth daily. 12/15/14  Yes Geradine Girt, DO  sevelamer carbonate (RENVELA) 800 MG tablet Take 2,400 mg by mouth 3 (three) times daily with meals.    Yes Historical Provider, MD  simethicone (GAS-X) 80 MG chewable tablet Chew 1 tablet (80 mg total) by mouth every 6 (six) hours as needed  for flatulence. 08/29/15  Yes Collier Salina, MD  sucroferric oxyhydroxide (VELPHORO) 500 MG chewable tablet Chew 1,000 mg by mouth 3 (three) times daily with meals.   Yes Historical Provider, MD  venlafaxine XR (EFFEXOR XR) 75 MG 24 hr capsule Take 1 capsule (75 mg total) by mouth daily with breakfast. 07/27/15  Yes Shela Leff, MD  Darbepoetin Alfa (ARANESP) 25 MCG/0.42ML SOSY injection Inject 0.42 mLs (25 mcg total) into the vein every Tuesday with hemodialysis. 12/15/14   Geradine Girt, DO  doxercalciferol (HECTOROL) 4 MCG/2ML injection Inject 1 mL (2 mcg total) into the vein Every Tuesday,Thursday,and Saturday with dialysis. 12/15/14   Geradine Girt, DO  ferric  gluconate 62.5 mg in sodium chloride 0.9 % 100 mL Inject 62.5 mg into the vein every Thursday with hemodialysis. 12/16/14   Geradine Girt, DO  guaiFENesin-codeine 100-10 MG/5ML syrup Take 5 mLs by mouth 3 (three) times daily as needed for cough. Patient not taking: Reported on 02/19/2016 01/19/16   Delsa Grana, PA-C  isosorbide mononitrate (IMDUR) 30 MG 24 hr tablet Take 1 tablet (30 mg total) by mouth daily. Patient not taking: Reported on 01/19/2016 10/30/15   Ripudeep K Rai, MD   BP 164/100 mmHg  Pulse 106  Temp(Src) 98.8 F (37.1 C) (Oral)  Resp 22  SpO2 97% Physical Exam  Constitutional: He is oriented to person, place, and time. He appears well-developed and well-nourished.  HENT:  Head: Normocephalic and atraumatic.  Right Ear: External ear normal.  Left Ear: External ear normal.  Mouth/Throat: Oropharynx is clear and moist.  Eyes: Conjunctivae are normal. Pupils are equal, round, and reactive to light.  Neck: Normal range of motion. Neck supple.  Cardiovascular: Normal rate, regular rhythm, normal heart sounds and intact distal pulses.   Pulmonary/Chest: Effort normal and breath sounds normal.  Abdominal: Soft. Bowel sounds are normal.  Musculoskeletal: Normal range of motion.       Arms: Neurological: He is  alert and oriented to person, place, and time.  Skin: Skin is warm and dry.  Psychiatric: He has a normal mood and affect. His behavior is normal. Judgment and thought content normal.  Nursing note and vitals reviewed.   ED Course  Procedures (including critical care time) Labs Review Labs Reviewed  BASIC METABOLIC PANEL - Abnormal; Notable for the following:    Glucose, Bld 147 (*)    BUN 51 (*)    Creatinine, Ser 8.32 (*)    GFR calc non Af Amer 6 (*)    GFR calc Af Amer 7 (*)    Anion gap 18 (*)    All other components within normal limits  CBC - Abnormal; Notable for the following:    Hemoglobin 10.3 (*)    HCT 34.7 (*)    MCH 24.1 (*)    MCHC 29.7 (*)    RDW 21.9 (*)    Platelets 139 (*)    All other components within normal limits  BRAIN NATRIURETIC PEPTIDE - Abnormal; Notable for the following:    B Natriuretic Peptide 1970.6 (*)    All other components within normal limits  I-STAT TROPOININ, ED    Imaging Review Dg Chest 2 View  02/19/2016  CLINICAL DATA:  Shortness of breath and chest pain for 2 weeks. EXAM: CHEST  2 VIEW COMPARISON:  02/13/2016 FINDINGS: Shallow inspiration with elevation of the left hemidiaphragm. Heart size and pulmonary vascularity are normal. Central interstitial pattern to the lungs with mild bronchial wall thickening may indicate chronic bronchitis. No focal airspace disease or consolidation. No blunting of costophrenic angles. No pneumothorax. Calcification of the aorta. IMPRESSION: Central interstitial changes in the lungs possibly representing chronic bronchitis or interstitial fibrosis. No evidence of active pulmonary disease. Electronically Signed   By: Lucienne Capers M.D.   On: 02/19/2016 21:39   I have personally reviewed and evaluated these images and lab results as part of my medical decision-making.   EKG Interpretation   Date/Time:  Sunday Feb 19 2016 19:51:29 EDT Ventricular Rate:  115 PR Interval:  138 QRS Duration: 94 QT  Interval:  334 QTC Calculation: 462 R Axis:   84 Text Interpretation:  Sinus tachycardia Non-specific intra-ventricular  conduction delay Abnormal ekg Confirmed by Carmin Muskrat  MD [phone]) on  02/19/2016 8:23:50 PM     After the initial evaluation I placed nasal cannula on the patient, and his oxygen saturation went from 90% up to 100% with 2 L. He had persistent tachycardia, with a heart rate 110/115 sinus, abnormal  Chart review notable for similar prior presentations.  Initial labs notable for elevated BNP, potassium is unremarkable.  MDM  Patient with multiple medical issues including end-stage renal disease, CHF, hypertension now presents with dyspnea, chest discomfort. Patient is found to have a clinical fluid overload, is persistently tachycardic, tachypneic, borderline hypoxic. With elevated BNP, and the patient's ongoing dyspnea, I discussed his case with our nephrology team, and hospitalists for admission.   Carmin Muskrat, MD 02/19/16 2223

## 2016-02-19 NOTE — ED Notes (Signed)
Attempted report.  Nurse to call back when available. 

## 2016-02-20 DIAGNOSIS — E785 Hyperlipidemia, unspecified: Secondary | ICD-10-CM | POA: Diagnosis present

## 2016-02-20 DIAGNOSIS — Z9981 Dependence on supplemental oxygen: Secondary | ICD-10-CM | POA: Diagnosis not present

## 2016-02-20 DIAGNOSIS — Z9111 Patient's noncompliance with dietary regimen: Secondary | ICD-10-CM | POA: Diagnosis not present

## 2016-02-20 DIAGNOSIS — F329 Major depressive disorder, single episode, unspecified: Secondary | ICD-10-CM | POA: Diagnosis present

## 2016-02-20 DIAGNOSIS — E1122 Type 2 diabetes mellitus with diabetic chronic kidney disease: Secondary | ICD-10-CM | POA: Diagnosis present

## 2016-02-20 DIAGNOSIS — D631 Anemia in chronic kidney disease: Secondary | ICD-10-CM | POA: Diagnosis not present

## 2016-02-20 DIAGNOSIS — K219 Gastro-esophageal reflux disease without esophagitis: Secondary | ICD-10-CM | POA: Diagnosis present

## 2016-02-20 DIAGNOSIS — Z87891 Personal history of nicotine dependence: Secondary | ICD-10-CM | POA: Diagnosis not present

## 2016-02-20 DIAGNOSIS — Z888 Allergy status to other drugs, medicaments and biological substances status: Secondary | ICD-10-CM | POA: Diagnosis not present

## 2016-02-20 DIAGNOSIS — N186 End stage renal disease: Secondary | ICD-10-CM

## 2016-02-20 DIAGNOSIS — N250 Renal osteodystrophy: Secondary | ICD-10-CM | POA: Diagnosis present

## 2016-02-20 DIAGNOSIS — I5023 Acute on chronic systolic (congestive) heart failure: Secondary | ICD-10-CM | POA: Diagnosis present

## 2016-02-20 DIAGNOSIS — I2510 Atherosclerotic heart disease of native coronary artery without angina pectoris: Secondary | ICD-10-CM | POA: Diagnosis present

## 2016-02-20 DIAGNOSIS — Z79899 Other long term (current) drug therapy: Secondary | ICD-10-CM | POA: Diagnosis not present

## 2016-02-20 DIAGNOSIS — E119 Type 2 diabetes mellitus without complications: Secondary | ICD-10-CM | POA: Diagnosis not present

## 2016-02-20 DIAGNOSIS — I132 Hypertensive heart and chronic kidney disease with heart failure and with stage 5 chronic kidney disease, or end stage renal disease: Secondary | ICD-10-CM | POA: Diagnosis present

## 2016-02-20 DIAGNOSIS — Z86010 Personal history of colonic polyps: Secondary | ICD-10-CM | POA: Diagnosis not present

## 2016-02-20 DIAGNOSIS — J42 Unspecified chronic bronchitis: Secondary | ICD-10-CM | POA: Diagnosis present

## 2016-02-20 DIAGNOSIS — R0600 Dyspnea, unspecified: Secondary | ICD-10-CM | POA: Diagnosis not present

## 2016-02-20 DIAGNOSIS — E8770 Fluid overload, unspecified: Secondary | ICD-10-CM | POA: Diagnosis not present

## 2016-02-20 DIAGNOSIS — Z992 Dependence on renal dialysis: Secondary | ICD-10-CM | POA: Diagnosis not present

## 2016-02-20 DIAGNOSIS — I120 Hypertensive chronic kidney disease with stage 5 chronic kidney disease or end stage renal disease: Secondary | ICD-10-CM | POA: Diagnosis not present

## 2016-02-20 DIAGNOSIS — Z7982 Long term (current) use of aspirin: Secondary | ICD-10-CM | POA: Diagnosis not present

## 2016-02-20 DIAGNOSIS — J811 Chronic pulmonary edema: Secondary | ICD-10-CM | POA: Diagnosis present

## 2016-02-20 DIAGNOSIS — R0902 Hypoxemia: Secondary | ICD-10-CM | POA: Diagnosis present

## 2016-02-20 DIAGNOSIS — Z7902 Long term (current) use of antithrombotics/antiplatelets: Secondary | ICD-10-CM | POA: Diagnosis not present

## 2016-02-20 DIAGNOSIS — D649 Anemia, unspecified: Secondary | ICD-10-CM | POA: Diagnosis present

## 2016-02-20 LAB — CBC WITH DIFFERENTIAL/PLATELET
BASOS ABS: 0 10*3/uL (ref 0.0–0.1)
BASOS PCT: 0 %
Eosinophils Absolute: 0.3 10*3/uL (ref 0.0–0.7)
Eosinophils Relative: 4 %
HEMATOCRIT: 32.2 % — AB (ref 39.0–52.0)
HEMOGLOBIN: 9.9 g/dL — AB (ref 13.0–17.0)
LYMPHS ABS: 0.9 10*3/uL (ref 0.7–4.0)
LYMPHS PCT: 14 %
MCH: 24.7 pg — AB (ref 26.0–34.0)
MCHC: 30.7 g/dL (ref 30.0–36.0)
MCV: 80.3 fL (ref 78.0–100.0)
MONOS PCT: 9 %
Monocytes Absolute: 0.6 10*3/uL (ref 0.1–1.0)
NEUTROS ABS: 4.9 10*3/uL (ref 1.7–7.7)
Neutrophils Relative %: 73 %
Platelets: 122 10*3/uL — ABNORMAL LOW (ref 150–400)
RBC: 4.01 MIL/uL — ABNORMAL LOW (ref 4.22–5.81)
RDW: 21.6 % — AB (ref 11.5–15.5)
WBC: 6.7 10*3/uL (ref 4.0–10.5)

## 2016-02-20 LAB — GLUCOSE, CAPILLARY
Glucose-Capillary: 148 mg/dL — ABNORMAL HIGH (ref 65–99)
Glucose-Capillary: 125 mg/dL — ABNORMAL HIGH (ref 65–99)
Glucose-Capillary: 81 mg/dL (ref 65–99)

## 2016-02-20 LAB — RENAL FUNCTION PANEL
Albumin: 3 g/dL — ABNORMAL LOW (ref 3.5–5.0)
Anion gap: 18 — ABNORMAL HIGH (ref 5–15)
BUN: 63 mg/dL — AB (ref 6–20)
CO2: 23 mmol/L (ref 22–32)
Calcium: 9 mg/dL (ref 8.9–10.3)
Chloride: 97 mmol/L — ABNORMAL LOW (ref 101–111)
Creatinine, Ser: 9.89 mg/dL — ABNORMAL HIGH (ref 0.61–1.24)
GFR, EST AFRICAN AMERICAN: 6 mL/min — AB (ref >60)
GFR, EST NON AFRICAN AMERICAN: 5 mL/min — AB (ref >60)
GLUCOSE: 142 mg/dL — AB (ref 65–99)
PHOSPHORUS: 11 mg/dL — AB (ref 2.5–4.6)
POTASSIUM: 4.4 mmol/L (ref 3.5–5.1)
Sodium: 138 mmol/L (ref 135–145)

## 2016-02-20 LAB — MRSA PCR SCREENING: MRSA by PCR: NEGATIVE

## 2016-02-20 MED ORDER — ALTEPLASE 2 MG IJ SOLR: 2.0000 mg | Freq: Once | INTRAMUSCULAR | Status: DC | PRN

## 2016-02-20 MED ORDER — PENTAFLUOROPROP-TETRAFLUOROETH EX AERO: 1.0000 "application " | INHALATION_SPRAY | CUTANEOUS | Status: DC | PRN

## 2016-02-20 MED ORDER — SODIUM CHLORIDE 0.9 % IV SOLN: 100.0000 mL | INTRAVENOUS | Status: DC | PRN

## 2016-02-20 MED ORDER — LIDOCAINE HCL (PF) 1 % IJ SOLN: 5.0000 mL | INTRAMUSCULAR | Status: DC | PRN

## 2016-02-20 MED ORDER — INSULIN ASPART 100 UNIT/ML ~~LOC~~ SOLN: 0.0000 [IU] | Freq: Three times a day (TID) | SUBCUTANEOUS | Status: DC

## 2016-02-20 MED ORDER — CETYLPYRIDINIUM CHLORIDE 0.05 % MT LIQD
7.0000 mL | Freq: Two times a day (BID) | OROMUCOSAL | Status: DC
  Administered 2016-02-20 – 2016-02-21 (×2): 7 mL via OROMUCOSAL

## 2016-02-20 MED ORDER — LIDOCAINE-PRILOCAINE 2.5-2.5 % EX CREA: 1.0000 "application " | TOPICAL_CREAM | CUTANEOUS | Status: DC | PRN

## 2016-02-20 MED ORDER — LIDOCAINE-PRILOCAINE 2.5-2.5 % EX CREA
1.0000 | TOPICAL_CREAM | CUTANEOUS | Status: DC | PRN
Start: 2016-02-20 — End: 2016-02-20
  Filled 2016-02-20: qty 5

## 2016-02-20 MED ORDER — HEPARIN SODIUM (PORCINE) 1000 UNIT/ML DIALYSIS: 2000.0000 [IU] | INTRAMUSCULAR | Status: DC | PRN

## 2016-02-20 MED ORDER — HEPARIN SODIUM (PORCINE) 1000 UNIT/ML DIALYSIS: 1000.0000 [IU] | INTRAMUSCULAR | Status: DC | PRN

## 2016-02-20 NOTE — Consult Note (Signed)
Renal Service Consult Note [facility]  Jamse Seroka 02/20/2016 Yoe D Requesting Physician:  Dr. Wendee Beavers  Reason for Consult:  ESRD pt with SOB/ CP HPI: The patient is a 60 y.o. year-old with hist of DM2, HTN, and ESRD on HD since 2009.  Presenting to ED with 1 day hist of SOB and chest pain.  CP is anterior, upper mid chest, no radiation, worse w coughing, no remediating factors.  No fevers or prod cough. +cough.  No abd pain or n/v/d. Some +blood tinged phlegm.  Hist of CAD with cor stents in place (2011- 2012).    Last HD was on Sat, cramping so signed off early.  No tobacco, occ etoh.  Was diabetic for years then lost weight and got off the medication.  Divorced, lives w his son who is 62 yo and is a Administrator.  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  Likes to go hunting in his spare time.      ROS  no joint pain   no HA  no blurry vision  no rash  no diarrhea  no nausea/ vomiting  no dysuria  no difficulty voiding  no change in urine color    Past Medical History  Past Medical History  Diagnosis Date  . Hypertension   . Hematochezia     a. 2014: colonscopy, which showed moderately-sized internal hemorrhoids, two 79mm polyps in transverse colon and ascending colon that were resected, five 2-5mm polyps in sigmoid colon, descending colon, transverse colon, and ascending colon that were resected. An upper endoscopy was performed and showed normal esophagus, stomach, and duodenum.  . Hematuria     a. H/o hematuria 2014 with cystoscopy that was unrevealing for his source of hematuria. He underwent a kidney ultrasound on 10/14 that showed mildly echogenic and scarred kidneys compatible with medical renal disease, without hydronephrosis or renal calculi.  [name] Anemia   . CAD (coronary artery disease)     a. per [facility] s/p 3.11mm x 54mm Vision BMS to mid LAD  12/2009 and Xience DES to mid LAD 10/2010.  . Colon polyps   . Chronic diastolic CHF (congestive heart failure) ([place])   . Hyperlipidemia   . Anginal pain ([place])   . Heart murmur   . 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 Type II diabetes mellitus ([place])   . History of blood transfusion     "had colonoscopy done; they had to give me some blood"  . Daily headache   . ESRD on dialysis [facility]) since ~ 2008    "[place]; TTS" (07/21/2015)  . On home oxygen therapy     "2L prn" (07/21/2015)  . Renal insufficiency    Past Surgical History  Past Surgical History  Procedure Laterality Date  . Left heart catheterization with coronary angiogram N/A 11/23/2014    Procedure: LEFT HEART CATHETERIZATION WITH CORONARY ANGIOGRAM;  Surgeon: Troy Sine, MD;  Location: [facility] CATH LAB;  Service: Cardiovascular;  Laterality: N/A;  . Lithotripsy  X1  . Cystoscopy w/ stone manipulation  X2?  . Cardiac catheterization  "several"  . Coronary angioplasty with stent placement  "several"  . Eye surgery Bilateral     "laser OR for hemorrhage"  . Av fistula placement Left ~ 2007    "upper arm"   Family  History  Family History  Problem Relation Age of Onset  . Hypertension    . Bone cancer Mother   . Anuerysm Father   . Diabetes type II Daughter    Social History  reports that he quit smoking about 5 years ago. His smoking use included Cigarettes. He has a 4 pack-year smoking history. He has never used smokeless tobacco. He reports that he does not drink alcohol or use illicit drugs. Allergies  Allergies  Allergen Reactions  . Enalapril Hives   Home medications Prior to Admission medications   Medication Sig Start Date End Date Taking? Authorizing Provider  albuterol (PROVENTIL HFA;VENTOLIN HFA) 108 (90 Base) MCG/ACT inhaler Inhale 1-2 puffs into the lungs every 6 (six) hours as needed for wheezing or shortness of breath. 01/19/16  Yes Delsa Grana, PA-C  amitriptyline  (ELAVIL) 100 MG tablet Take 1 tablet (100 mg total) by mouth at bedtime. 07/27/15  Yes Shela Leff, MD  amLODipine (NORVASC) 10 MG tablet Take 1 tablet (10 mg total) by mouth daily. 10/30/15  Yes Ripudeep Krystal Eaton, MD  aspirin 81 MG chewable tablet Chew 1 tablet (81 mg total) by mouth daily. 07/22/15  Yes Iline Oven, MD  atorvastatin (LIPITOR) 20 MG tablet Take 20 mg by mouth at bedtime.    Yes Historical Provider, MD  carvedilol (COREG) 25 MG tablet Take 2 tablets (50 mg total) by mouth 2 (two) times daily. 10/30/15  Yes Ripudeep Krystal Eaton, MD  cinacalcet (SENSIPAR) 90 MG tablet Take 90 mg by mouth at bedtime.    Yes Historical Provider, MD  clopidogrel (PLAVIX) 75 MG tablet Take 1 tablet (75 mg total) by mouth daily. 11/24/14  Yes Marjan Rabbani, MD  dextromethorphan-guaiFENesin (TUSSIN DM) 10-100 MG/5ML liquid Take 10 mLs by mouth every 4 (four) hours as needed for cough. 01/03/16  Yes Thurnell Lose, MD  losartan (COZAAR) 100 MG tablet Take 100 mg by mouth daily.   Yes Historical Provider, MD  multivitamin (RENA-VIT) TABS tablet Take 1 tablet by mouth at bedtime. 12/15/14  Yes Geradine Girt, DO  nitroGLYCERIN (NITROSTAT) 0.4 MG SL tablet Place 1 tablet (0.4 mg total) under the tongue every 5 (five) minutes as needed for chest pain. 06/05/15  Yes Janece Canterbury, MD  pantoprazole (PROTONIX) 40 MG tablet Take 1 tablet (40 mg total) by mouth daily. 06/05/15  Yes Janece Canterbury, MD  pregabalin (LYRICA) 50 MG capsule Take 1 capsule (50 mg total) by mouth daily. 12/15/14  Yes Geradine Girt, DO  sevelamer carbonate (RENVELA) 800 MG tablet Take 2,400 mg by mouth 3 (three) times daily with meals.    Yes Historical Provider, MD  simethicone (GAS-X) 80 MG chewable tablet Chew 1 tablet (80 mg total) by mouth every 6 (six) hours as needed for flatulence. 08/29/15  Yes Collier Salina, MD  sucroferric oxyhydroxide (VELPHORO) 500 MG chewable tablet Chew 1,000 mg by mouth 3 (three) times daily with meals.    Yes Historical Provider, MD  venlafaxine XR (EFFEXOR XR) 75 MG 24 hr capsule Take 1 capsule (75 mg total) by mouth daily with breakfast. 07/27/15  Yes Shela Leff, MD  Darbepoetin Alfa (ARANESP) 25 MCG/0.42ML SOSY injection Inject 0.42 mLs (25 mcg total) into the vein every Tuesday with hemodialysis. 12/15/14   Geradine Girt, DO  doxercalciferol (HECTOROL) 4 MCG/2ML injection Inject 1 mL (2 mcg total) into the vein Every Tuesday,Thursday,and Saturday with dialysis. 12/15/14   Geradine Girt, DO  ferric gluconate 62.5 mg in  sodium chloride 0.9 % 100 mL Inject 62.5 mg into the vein every Thursday with hemodialysis. 12/16/14   Geradine Girt, DO  guaiFENesin-codeine 100-10 MG/5ML syrup Take 5 mLs by mouth 3 (three) times daily as needed for cough. Patient not taking: Reported on 02/19/2016 01/19/16   Delsa Grana, PA-C  isosorbide mononitrate (IMDUR) 30 MG 24 hr tablet Take 1 tablet (30 mg total) by mouth daily. Patient not taking: Reported on 01/19/2016 10/30/15   Ripudeep Krystal Eaton, MD   Liver Function Tests No results for input(s): AST, ALT, ALKPHOS, BILITOT, PROT, ALBUMIN in the last 168 hours. No results for input(s): LIPASE, AMYLASE in the last 168 hours. CBC  Recent Labs Lab 02/13/16 1808 02/19/16 2011  WBC 9.4 10.0  HGB 10.5* 10.3*  HCT 35.3* 34.7*  MCV 81.1 81.1  PLT 224 [id number]   Basic Metabolic Panel  Recent Labs Lab 02/13/16 1808 02/19/16 2011  NA 140 141  K 4.4 3.7  CL 99* 101  CO2 22 22  GLUCOSE 138* 147*  BUN 58* 51*  CREATININE 10.75* 8.32*  CALCIUM 9.5 9.0    Filed Vitals:   02/19/16 2301 02/19/16 2317 02/20/16 0133 02/20/16 0621  BP:  162/117  126/74  Pulse:  107    Temp: 97.8 F (36.6 C) 98 F (36.7 C)  98 F (36.7 C)  TempSrc: Oral Oral  Oral  Resp:      Height:  6' (1.829 m)    Weight:  97.07 kg (214 lb)  97.01 kg (213 lb 13.9 oz)  SpO2:  98% 98% 97%   Exam Alert, no distress No rash, cyanosis or gangrene Sclera anicteric, throat clear  +JVD Chest  occ scattered basilar rales RRR loud holosyst M at apex 3/6 Abd soft ntnd no mass +BS, poss mod ascites GU normal male MS no joint effusions or deformity Ext no LE edema / no wounds or ulcers Neuro is alert, Ox 3 , nf    Dialysis: [facility]  TTS   4h   2/2 bath P2  90kg  Hep ? Hb 10.6, Ca 9.4, P 11.2  pth 784 Hect 6 ug Venofer 50 / wk Mirc 150 ug last 5/2  Assessment: 1. Hypoxemia/ dyspnea/ pulm edema - prob all due to vol overload.  Signs off early and doesn't follow fluid restriction guidelines.  Up 6-7kg by wts. Plan HD today abd again tomorrow, get vol down 2. ESRD on HD TTS 3. HTN on norvasc/ coreg/ cozaar 4. CAD on imdur/ asa/ statin 5. MBD cont meds 6. Anemia Hb 10, observe   Plan - HD today and Tues as above  Kelly Splinter MD [employer] pager [phone]    cell [phone] 02/20/2016, 1:22 PM

## 2016-02-20 NOTE — Progress Notes (Addendum)
PROGRESS NOTE                                                                                                                                                                                                             Patient Demographics:    [name] [name], is a 60 y.o. male, DOB - 26-Jul-1956, [id number]  Admit date - 02/19/2016   Admitting Physician Gennaro Africa, MD  Outpatient Primary MD for the patient is [name]   Chief Complaint  Patient presents with  . Chest Pain  . Shortness of Breath       Brief Narrative Patient is a 60 year old with history of DM type II, hypertension, and end-stage renal disease on hemodialysis since 2009. Presenting to the ED with main complaint is shortness of breath.   Subjective:    [name] [name] Pt complaining of SOB, no other complaints currently   Assessment  & Plan :    Active Problems:   ESRD on hemodialysis (HCC)/Dyspnea - Shortness of breath most likely secondary to patient being fluid overloaded. Nephrology on board and plans are for dialysis today and tomorrow.  Hypertension   Diabetes mellitus type 2, diet-controlled ([place]) - Addendum: Place on diabetic diet    GERD (gastroesophageal reflux disease) - on Protonix  HPL - Lipitor    Depression   - Continue Effexor  Code Status : full  Family Communication  : d/c patient directly  Disposition Plan  : pending resolution of shortness of breath  Consults  :  Nephrology  Procedures  : Dialysis  DVT Prophylaxis  :   Heparin  Lab Results  Component Value Date   PLT 139* 02/19/2016    Antibiotics  :  n/a  Anti-infectives    None        Objective:   Filed Vitals:   02/19/16 2301 02/19/16 2317 02/20/16 0133 02/20/16 0621  BP:  162/117  126/74  Pulse:  107    Temp: 97.8 F (36.6 C) 98 F (36.7 C)  98 F (36.7 C)  TempSrc: Oral Oral  Oral  Resp:      Height:  6' (1.829 m)    Weight:   97.07 kg (214 lb)  97.01 kg (213 lb 13.9 oz)  SpO2:  98% 98% 97%    Wt Readings from Last 3 Encounters:  02/20/16 97.01 kg (213 lb 13.9 oz)  01/19/16 91.173 kg (201 lb)  01/03/16 88.9 kg (195 lb 15.8 oz)    No intake or output data in the 24 hours ending 02/20/16 1353   Physical Exam  Awake Alert, Oriented X 3 Supple Neck,No JVD, No cervical lymphadenopathy appriciated.  Symmetrical Chest wall movement, decreased breath sounds at bases RRR,No Gallops,Rubs or new Murmurs, No Parasternal Heave +ve B.Sounds, Abd Soft, No tenderness, No organomegaly appriciated, No rebound - guarding or rigidity. No Cyanosis, Clubbing     Data Review:    CBC  Recent Labs Lab 02/13/16 1808 02/19/16 2011  WBC 9.4 10.0  HGB 10.5* 10.3*  HCT 35.3* 34.7*  PLT 224 139*  MCV 81.1 81.1  MCH 24.1* 24.1*  MCHC 29.7* 29.7*  RDW 21.6* 21.9*    Chemistries   Recent Labs Lab 02/13/16 1808 02/19/16 2011  NA 140 141  K 4.4 3.7  CL 99* 101  CO2 22 22  GLUCOSE 138* 147*  BUN 58* 51*  CREATININE 10.75* 8.32*  CALCIUM 9.5 9.0   ------------------------------------------------------------------------------------------------------------------ No results for input(s): CHOL, HDL, LDLCALC, TRIG, CHOLHDL, LDLDIRECT in the last 72 hours.  Lab Results  Component Value Date   HGBA1C 6.1* 10/29/2015   ------------------------------------------------------------------------------------------------------------------ No results for input(s): TSH, T4TOTAL, T3FREE, THYROIDAB in the last 72 hours.  Invalid input(s): FREET3 ------------------------------------------------------------------------------------------------------------------ No results for input(s): VITAMINB12, FOLATE, FERRITIN, TIBC, IRON, RETICCTPCT in the last 72 hours.  Coagulation profile No results for input(s): INR, PROTIME in the last 168 hours.  No results for input(s): DDIMER in the last 72 hours.  Cardiac Enzymes No results  for input(s): CKMB, TROPONINI, MYOGLOBIN in the last 168 hours.  Invalid input(s): CK ------------------------------------------------------------------------------------------------------------------    Component Value Date/Time   BNP 1970.6* 02/19/2016 2050    Inpatient Medications  Scheduled Meds: . amitriptyline  100 mg Oral QHS  . amLODipine  10 mg Oral Daily  . antiseptic oral rinse  7 mL Mouth Rinse BID  . aspirin  81 mg Oral Daily  . atorvastatin  20 mg Oral QHS  . carvedilol  50 mg Oral BID  . cinacalcet  90 mg Oral QHS  . clopidogrel  75 mg Oral Daily  . heparin  5,000 Units Subcutaneous Q8H  . multivitamin  1 tablet Oral QHS  . pantoprazole  40 mg Oral Daily  . pregabalin  50 mg Oral Daily  . sevelamer carbonate  2,400 mg Oral TID WC  . sucroferric oxyhydroxide  1,000 mg Oral TID WC  . venlafaxine XR  75 mg Oral Q breakfast   Continuous Infusions:  PRN Meds:.albuterol, guaiFENesin-dextromethorphan, nitroGLYCERIN, ondansetron (ZOFRAN) IV, simethicone  Micro Results Recent Results (from the past 240 hour(s))  MRSA PCR Screening     Status: None   Collection Time: 02/19/16 11:23 PM  Result Value Ref Range Status   MRSA by PCR NEGATIVE NEGATIVE Final    Comment:        The GeneXpert MRSA Assay (FDA approved for NASAL specimens only), is one component of a comprehensive MRSA colonization surveillance program. It is not intended to diagnose MRSA infection nor to guide or monitor treatment for MRSA infections.     Radiology Reports Dg Chest 2 View  02/19/2016  CLINICAL DATA:  Shortness of breath and chest pain for 2 weeks. EXAM: CHEST  2 VIEW COMPARISON:  02/13/2016 FINDINGS: Shallow inspiration with elevation of the left hemidiaphragm. Heart size and pulmonary vascularity are normal. Central interstitial pattern to the  lungs with mild bronchial wall thickening may indicate chronic bronchitis. No focal airspace disease or consolidation. No blunting of  costophrenic angles. No pneumothorax. Calcification of the aorta. IMPRESSION: Central interstitial changes in the lungs possibly representing chronic bronchitis or interstitial fibrosis. No evidence of active pulmonary disease. Electronically Signed   By: Lucienne Capers M.D.   On: 02/19/2016 21:39   Dg Chest 2 View  02/13/2016  CLINICAL DATA:  [place] getting out of bed. Losing consciousness. Right chest pain and right shoulder pain. EXAM: CHEST  2 VIEW COMPARISON:  01/19/2016 FINDINGS: Heart size is mildly enlarged but similar to the previous examination. Lungs are clear without airspace disease, pulmonary edema or pneumothorax. Patient is mildly rotated towards the right. No large pleural effusions. Bony thorax appears to be intact. IMPRESSION: No acute cardiopulmonary disease. Electronically Signed   By: Markus Daft M.D.   On: 02/13/2016 17:24   Ct Head Wo Contrast  02/13/2016  CLINICAL DATA:  60 year old male with unwitnessed fall, on heparin. Initial encounter. EXAM: CT HEAD WITHOUT CONTRAST TECHNIQUE: Contiguous axial images were obtained from the base of the skull through the vertex without intravenous contrast. COMPARISON:  10/30/2015 noncontrast head CT.  Brain MRI 10/28/2015. FINDINGS: Visualized paranasal sinuses and mastoids are clear. Mild right parietal convexity scalp soft tissue scarring again noted. No acute scalp hematoma identified. Visualized orbit soft tissues are within normal limits. Calvarium intact. No acute osseous abnormality identified. Calcified atherosclerosis at the skull base. Cerebral white matter changes re- demonstrated and appears stable from the January MRI. Stable gray-white matter differentiation. No cortically based acute infarct identified. No midline shift, mass effect, or evidence of intracranial mass lesion. No acute intracranial hemorrhage identified. No ventriculomegaly. No suspicious intracranial vascular hyperdensity. IMPRESSION: No acute intracranial abnormality.  Stable nonspecific cerebral white matter signal changes since the brain MRI in January. Electronically Signed   By: Genevie Ann M.D.   On: 02/13/2016 18:18    Time Spent in minutes  25   Velvet Bathe M.D on 02/20/2016 at 1:53 PM  Between 7am to 7pm - Pager - [phone]  After 7pm go to [email or web address] - password [facility]  [facility] -  Office  [phone]

## 2016-02-20 NOTE — Care Management Obs Status (Addendum)
[place] NOTIFICATION   Patient Details  Name: [name] [name] MRN: [id number] Date of Birth: 1956-10-02   Medicare Observation Status Notification Given:  Yes    Sharin Mons, RN 02/20/2016, 8:54 AM

## 2016-02-21 LAB — CBC
HCT: 30.5 % — ABNORMAL LOW (ref 39.0–52.0)
Hemoglobin: 9.3 g/dL — ABNORMAL LOW (ref 13.0–17.0)
MCH: 24.5 pg — AB (ref 26.0–34.0)
MCHC: 30.5 g/dL (ref 30.0–36.0)
MCV: 80.3 fL (ref 78.0–100.0)
PLATELETS: 108 10*3/uL — AB (ref 150–400)
RBC: 3.8 MIL/uL — AB (ref 4.22–5.81)
RDW: 21.3 % — AB (ref 11.5–15.5)
WBC: 6.6 10*3/uL (ref 4.0–10.5)

## 2016-02-21 LAB — RENAL FUNCTION PANEL
Albumin: 2.9 g/dL — ABNORMAL LOW (ref 3.5–5.0)
Anion gap: 13 (ref 5–15)
BUN: 42 mg/dL — AB (ref 6–20)
CALCIUM: 8.4 mg/dL — AB (ref 8.9–10.3)
CHLORIDE: 96 mmol/L — AB (ref 101–111)
CO2: 26 mmol/L (ref 22–32)
CREATININE: 7.79 mg/dL — AB (ref 0.61–1.24)
GFR, EST AFRICAN AMERICAN: 8 mL/min — AB (ref >60)
GFR, EST NON AFRICAN AMERICAN: 7 mL/min — AB (ref >60)
Glucose, Bld: 141 mg/dL — ABNORMAL HIGH (ref 65–99)
Phosphorus: 8.8 mg/dL — ABNORMAL HIGH (ref 2.5–4.6)
Potassium: 4.3 mmol/L (ref 3.5–5.1)
SODIUM: 135 mmol/L (ref 135–145)

## 2016-02-21 LAB — GLUCOSE, CAPILLARY
GLUCOSE-CAPILLARY: 119 mg/dL — AB (ref 65–99)
Glucose-Capillary: 147 mg/dL — ABNORMAL HIGH (ref 65–99)

## 2016-02-21 NOTE — Progress Notes (Signed)
  [facility] Progress Note   Subjective: 3.5 kg off yest w HD  Filed Vitals:   02/20/16 1930 02/20/16 1937 02/20/16 2102 02/21/16 0640  BP: 151/83 164/88 165/98 161/98  Pulse: 81 81 88 95  Temp:  97.7 F (36.5 C) 98 F (36.7 C) 98.5 F (36.9 C)  TempSrc:  Oral Oral Oral  Resp:  18 18 18   Height:      Weight:  94.8 kg (208 lb 15.9 oz)  94.348 kg (208 lb)  SpO2:  97% 100% 100%    Inpatient medications: . amitriptyline  100 mg Oral QHS  . amLODipine  10 mg Oral Daily  . antiseptic oral rinse  7 mL Mouth Rinse BID  . aspirin  81 mg Oral Daily  . atorvastatin  20 mg Oral QHS  . carvedilol  50 mg Oral BID  . cinacalcet  90 mg Oral QHS  . clopidogrel  75 mg Oral Daily  . heparin  5,000 Units Subcutaneous Q8H  . multivitamin  1 tablet Oral QHS  . pantoprazole  40 mg Oral Daily  . pregabalin  50 mg Oral Daily  . sevelamer carbonate  2,400 mg Oral TID WC  . sucroferric oxyhydroxide  1,000 mg Oral TID WC  . venlafaxine XR  75 mg Oral Q breakfast     albuterol, guaiFENesin-dextromethorphan, nitroGLYCERIN, ondansetron (ZOFRAN) IV, simethicone  Exam: Alert, no distress No rash, cyanosis or gangrene Sclera anicteric, throat clear  +JVD Chest occ scattered basilar rales RRR loud holosyst M at apex 3/6 Abd soft ntnd no mass +BS, poss mod ascites GU normal male MS no joint effusions or deformity Ext no LE edema / no wounds or ulcers Neuro is alert, Ox 3 , nf    Dialysis: [facility] TTS 4h 2/2 bath P2 90kg Hep ? Hb 10.6, Ca 9.4, P 11.2 pth 784 Hect 6 ug Venofer 50 / wk Mirc 150 ug last 5/2  Assessment: 1. Hypoxemia/ dyspnea/ pulm edema - all due to vol overload. Signs off early and doesn't follow fluid restriction guidelines. Up 4kg today, plan HD today , get back on schedule and will get close to dry wt.  2. ESRD on HD TTS 3. HTN on norvasc/ coreg/ cozaar 4. CAD on imdur/ asa/ statin 5. MBD cont meds 6. Anemia Hb 10, observe  Plan - HD  again today, wean off of oxygen and should be OK for dc after HD today.  Have discussed fluid issues at length w patient.    Kelly Splinter MD [facility] pager [phone]    cell [phone] 02/21/2016, 11:32 AM    Recent Labs Lab 02/19/16 2011 02/20/16 1638  NA 141 138  K 3.7 4.4  CL 101 97*  CO2 22 23  GLUCOSE 147* 142*  BUN 51* 63*  CREATININE 8.32* 9.89*  CALCIUM 9.0 9.0  PHOS  --  11.0*    Recent Labs Lab 02/20/16 1638  ALBUMIN 3.0*    Recent Labs Lab 02/19/16 2011 02/20/16 1638  WBC 10.0 6.7  NEUTROABS  --  4.9  HGB 10.3* 9.9*  HCT 34.7* 32.2*  MCV 81.1 80.3  PLT 139* 122*

## 2016-02-21 NOTE — Discharge Summary (Signed)
Physician Discharge Summary  [name] [name] [id number] DOB: [birth year] DOA: 02/19/2016  PCP: [name]  Admit date: 02/19/2016 Discharge date: 02/21/2016  Time spent: > 35 minutes  Recommendations for Outpatient Follow-up:  1. Encourage patient to adhere to renal diet   Discharge Diagnoses:  Active Problems:   Hypertension   Diabetes mellitus type 2, diet-controlled ([place])   GERD (gastroesophageal reflux disease)   Depression   ESRD on hemodialysis ([place])   Dyspnea   Discharge Condition: stable  Diet recommendation: renal diet  Filed Weights   02/20/16 1937 02/21/16 0640 02/21/16 1334  Weight: 94.8 kg (208 lb 15.9 oz) 94.348 kg (208 lb) 95.8 kg (211 lb 3.2 oz)    History of present illness:  60 year old male with history of hypertension, end-stage renal disease on hemodialysis who presented complaining of worsening shortness of breath and dyspnea on exertion.  Hospital Course:  Dyspnea on exertion/shortness of breath  - Most likely noncompliance with renal diet. Patient reports increase in oral intake recently. Patient was seen and evaluated by nephrology who took patient to dialysis 2 times. Yesterday and today.  For other known medical conditions listed above continue home medication regimen listed below  Procedures:  HD  Consultations:  Nephrology  Discharge Exam: Filed Vitals:   02/21/16 1656 02/21/16 1708  BP: 114/65 107/63  Pulse:  79  Temp:    Resp:  16    General: Pt in nad, alert and awake Cardiovascular: s1 and s2 present, no rubs Respiratory: no increased wob, no wheezes, equal chest rise.  Discharge Instructions   Discharge Instructions    Call MD for:  difficulty breathing, headache or visual disturbances    Complete by:  As directed      Call MD for:  severe uncontrolled pain    Complete by:  As directed      Call MD for:  temperature >100.4    Complete by:  As directed      Diet - low sodium heart healthy    Complete by:  As  directed      Discharge instructions    Complete by:  As directed   Follow-up with your nephrologist on discharge for further evaluation and recommendations regarding end-stage renal disease     Increase activity slowly    Complete by:  As directed           Current Discharge Medication List    CONTINUE these medications which have NOT CHANGED   Details  albuterol (PROVENTIL HFA;VENTOLIN HFA) 108 (90 Base) MCG/ACT inhaler Inhale 1-2 puffs into the lungs every 6 (six) hours as needed for wheezing or shortness of breath. Qty: 1 Inhaler, Refills: 0    amitriptyline (ELAVIL) 100 MG tablet Take 1 tablet (100 mg total) by mouth at bedtime. Qty: 30 tablet, Refills: 2    amLODipine (NORVASC) 10 MG tablet Take 1 tablet (10 mg total) by mouth daily. Qty: 30 tablet, Refills: 3    aspirin 81 MG chewable tablet Chew 1 tablet (81 mg total) by mouth daily.    atorvastatin (LIPITOR) 20 MG tablet Take 20 mg by mouth at bedtime.     carvedilol (COREG) 25 MG tablet Take 2 tablets (50 mg total) by mouth 2 (two) times daily. Qty: 60 tablet, Refills: 3    cinacalcet (SENSIPAR) 90 MG tablet Take 90 mg by mouth at bedtime.     clopidogrel (PLAVIX) 75 MG tablet Take 1 tablet (75 mg total) by mouth daily. Qty: 30 tablet, Refills:  11    dextromethorphan-guaiFENesin (TUSSIN DM) 10-100 MG/5ML liquid Take 10 mLs by mouth every 4 (four) hours as needed for cough. Qty: 118 mL, Refills: 0    losartan (COZAAR) 100 MG tablet Take 100 mg by mouth daily.    multivitamin (RENA-VIT) TABS tablet Take 1 tablet by mouth at bedtime. Qty: 30 tablet, Refills: 0    nitroGLYCERIN (NITROSTAT) 0.4 MG SL tablet Place 1 tablet (0.4 mg total) under the tongue every 5 (five) minutes as needed for chest pain. Qty: 30 tablet, Refills: 0    pantoprazole (PROTONIX) 40 MG tablet Take 1 tablet (40 mg total) by mouth daily. Qty: 30 tablet, Refills: 0    pregabalin (LYRICA) 50 MG capsule Take 1 capsule (50 mg total) by mouth  daily. Qty: 30 capsule, Refills: 0    sevelamer carbonate (RENVELA) 800 MG tablet Take 2,400 mg by mouth 3 (three) times daily with meals.     simethicone (GAS-X) 80 MG chewable tablet Chew 1 tablet (80 mg total) by mouth every 6 (six) hours as needed for flatulence. Qty: 60 tablet, Refills: 0    sucroferric oxyhydroxide (VELPHORO) 500 MG chewable tablet Chew 1,000 mg by mouth 3 (three) times daily with meals.    venlafaxine XR (EFFEXOR XR) 75 MG 24 hr capsule Take 1 capsule (75 mg total) by mouth daily with breakfast. Qty: 30 capsule, Refills: 2    Darbepoetin Alfa (ARANESP) 25 MCG/0.42ML SOSY injection Inject 0.42 mLs (25 mcg total) into the vein every Tuesday with hemodialysis. Qty: 14.28 mL    doxercalciferol (HECTOROL) 4 MCG/2ML injection Inject 1 mL (2 mcg total) into the vein Every Tuesday,Thursday,and Saturday with dialysis. Qty: 2 mL    ferric gluconate 62.5 mg in sodium chloride 0.9 % 100 mL Inject 62.5 mg into the vein every Thursday with hemodialysis.    guaiFENesin-codeine 100-10 MG/5ML syrup Take 5 mLs by mouth 3 (three) times daily as needed for cough. Qty: 120 mL, Refills: 0    isosorbide mononitrate (IMDUR) 30 MG 24 hr tablet Take 1 tablet (30 mg total) by mouth daily. Qty: 30 tablet, Refills: 3       Allergies  Allergen Reactions  . Enalapril Hives      The results of significant diagnostics from this hospitalization (including imaging, microbiology, ancillary and laboratory) are listed below for reference.    Significant Diagnostic Studies: Dg Chest 2 View  02/19/2016  CLINICAL DATA:  Shortness of breath and chest pain for 2 weeks. EXAM: CHEST  2 VIEW COMPARISON:  02/13/2016 FINDINGS: Shallow inspiration with elevation of the left hemidiaphragm. Heart size and pulmonary vascularity are normal. Central interstitial pattern to the lungs with mild bronchial wall thickening may indicate chronic bronchitis. No focal airspace disease or consolidation. No blunting  of costophrenic angles. No pneumothorax. Calcification of the aorta. IMPRESSION: Central interstitial changes in the lungs possibly representing chronic bronchitis or interstitial fibrosis. No evidence of active pulmonary disease. Electronically Signed   By: Lucienne Capers M.D.   On: 02/19/2016 21:39   Dg Chest 2 View  02/13/2016  CLINICAL DATA:  [place] getting out of bed. Losing consciousness. Right chest pain and right shoulder pain. EXAM: CHEST  2 VIEW COMPARISON:  01/19/2016 FINDINGS: Heart size is mildly enlarged but similar to the previous examination. Lungs are clear without airspace disease, pulmonary edema or pneumothorax. Patient is mildly rotated towards the right. No large pleural effusions. Bony thorax appears to be intact. IMPRESSION: No acute cardiopulmonary disease. Electronically Signed   By:  Markus Daft M.D.   On: 02/13/2016 17:24   Ct Head Wo Contrast  02/13/2016  CLINICAL DATA:  60 year old male with unwitnessed fall, on heparin. Initial encounter. EXAM: CT HEAD WITHOUT CONTRAST TECHNIQUE: Contiguous axial images were obtained from the base of the skull through the vertex without intravenous contrast. COMPARISON:  10/30/2015 noncontrast head CT.  Brain MRI 10/28/2015. FINDINGS: Visualized paranasal sinuses and mastoids are clear. Mild right parietal convexity scalp soft tissue scarring again noted. No acute scalp hematoma identified. Visualized orbit soft tissues are within normal limits. Calvarium intact. No acute osseous abnormality identified. Calcified atherosclerosis at the skull base. Cerebral white matter changes re- demonstrated and appears stable from the January MRI. Stable gray-white matter differentiation. No cortically based acute infarct identified. No midline shift, mass effect, or evidence of intracranial mass lesion. No acute intracranial hemorrhage identified. No ventriculomegaly. No suspicious intracranial vascular hyperdensity. IMPRESSION: No acute intracranial abnormality.  Stable nonspecific cerebral white matter signal changes since the brain MRI in January. Electronically Signed   By: Genevie Ann M.D.   On: 02/13/2016 18:18    Microbiology: Recent Results (from the past 240 hour(s))  MRSA PCR Screening     Status: None   Collection Time: 02/19/16 11:23 PM  Result Value Ref Range Status   MRSA by PCR NEGATIVE NEGATIVE Final    Comment:        The GeneXpert MRSA Assay (FDA approved for NASAL specimens only), is one component of a comprehensive MRSA colonization surveillance program. It is not intended to diagnose MRSA infection nor to guide or monitor treatment for MRSA infections.      Labs: Basic Metabolic Panel:  Recent Labs Lab 02/19/16 2011 02/20/16 1638 02/21/16 1406  NA 141 138 135  K 3.7 4.4 4.3  CL 101 97* 96*  CO2 22 23 26   GLUCOSE 147* 142* 141*  BUN 51* 63* 42*  CREATININE 8.32* 9.89* 7.79*  CALCIUM 9.0 9.0 8.4*  PHOS  --  11.0* 8.8*   Liver Function Tests:  Recent Labs Lab 02/20/16 1638 02/21/16 1406  ALBUMIN 3.0* 2.9*   No results for input(s): LIPASE, AMYLASE in the last 168 hours. No results for input(s): AMMONIA in the last 168 hours. CBC:  Recent Labs Lab 02/19/16 2011 02/20/16 1638 02/21/16 1406  WBC 10.0 6.7 6.6  NEUTROABS  --  4.9  --   HGB 10.3* 9.9* 9.3*  HCT 34.7* 32.2* 30.5*  MCV 81.1 80.3 80.3  PLT 139* 122* 108*   Cardiac Enzymes: No results for input(s): CKTOTAL, CKMB, CKMBINDEX, TROPONINI in the last 168 hours. BNP: BNP (last 3 results)  Recent Labs  01/01/16 1556 01/19/16 1334 02/19/16 2050  BNP 2463.4* 866.3* 1970.6*    ProBNP (last 3 results) No results for input(s): PROBNP in the last 8760 hours.  CBG:  Recent Labs Lab 02/20/16 0733 02/20/16 1127 02/20/16 2102 02/21/16 0724 02/21/16 1147  GLUCAP 148* 125* 81 119* 147*    Signed:  Velvet Bathe MD.  [facility] 02/21/2016, 5:22 PM

## 2016-02-21 NOTE — Progress Notes (Addendum)
Hemodialysis- Tolerated well. Total UF 3.7L. Pt experienced cramping during last 15 minutes of treatment, thus unable to meet 4L goal. Some mild cramping in fingers post needle removal. "Aint bad" per patient. Report called to primary RN. Pt transported back to room in stable conditon. Pt also removed nasal cannula. Currently 95% on RA.

## 2016-02-23 DIAGNOSIS — D509 Iron deficiency anemia, unspecified: Secondary | ICD-10-CM | POA: Diagnosis not present

## 2016-02-23 DIAGNOSIS — N2581 Secondary hyperparathyroidism of renal origin: Secondary | ICD-10-CM | POA: Diagnosis not present

## 2016-02-23 DIAGNOSIS — N186 End stage renal disease: Secondary | ICD-10-CM | POA: Diagnosis not present

## 2016-02-23 DIAGNOSIS — E119 Type 2 diabetes mellitus without complications: Secondary | ICD-10-CM | POA: Diagnosis not present

## 2016-02-23 DIAGNOSIS — D631 Anemia in chronic kidney disease: Secondary | ICD-10-CM | POA: Diagnosis not present

## 2016-02-25 DIAGNOSIS — N2581 Secondary hyperparathyroidism of renal origin: Secondary | ICD-10-CM | POA: Diagnosis not present

## 2016-02-25 DIAGNOSIS — E119 Type 2 diabetes mellitus without complications: Secondary | ICD-10-CM | POA: Diagnosis not present

## 2016-02-25 DIAGNOSIS — D631 Anemia in chronic kidney disease: Secondary | ICD-10-CM | POA: Diagnosis not present

## 2016-02-25 DIAGNOSIS — N186 End stage renal disease: Secondary | ICD-10-CM | POA: Diagnosis not present

## 2016-02-25 DIAGNOSIS — D509 Iron deficiency anemia, unspecified: Secondary | ICD-10-CM | POA: Diagnosis not present

## 2016-02-28 DIAGNOSIS — D509 Iron deficiency anemia, unspecified: Secondary | ICD-10-CM | POA: Diagnosis not present

## 2016-02-28 DIAGNOSIS — E119 Type 2 diabetes mellitus without complications: Secondary | ICD-10-CM | POA: Diagnosis not present

## 2016-02-28 DIAGNOSIS — N2581 Secondary hyperparathyroidism of renal origin: Secondary | ICD-10-CM | POA: Diagnosis not present

## 2016-02-28 DIAGNOSIS — D631 Anemia in chronic kidney disease: Secondary | ICD-10-CM | POA: Diagnosis not present

## 2016-02-28 DIAGNOSIS — N186 End stage renal disease: Secondary | ICD-10-CM | POA: Diagnosis not present

## 2016-03-01 DIAGNOSIS — N2581 Secondary hyperparathyroidism of renal origin: Secondary | ICD-10-CM | POA: Diagnosis not present

## 2016-03-01 DIAGNOSIS — N186 End stage renal disease: Secondary | ICD-10-CM | POA: Diagnosis not present

## 2016-03-01 DIAGNOSIS — E119 Type 2 diabetes mellitus without complications: Secondary | ICD-10-CM | POA: Diagnosis not present

## 2016-03-01 DIAGNOSIS — D509 Iron deficiency anemia, unspecified: Secondary | ICD-10-CM | POA: Diagnosis not present

## 2016-03-01 DIAGNOSIS — D631 Anemia in chronic kidney disease: Secondary | ICD-10-CM | POA: Diagnosis not present

## 2016-03-03 DIAGNOSIS — D509 Iron deficiency anemia, unspecified: Secondary | ICD-10-CM | POA: Diagnosis not present

## 2016-03-03 DIAGNOSIS — D631 Anemia in chronic kidney disease: Secondary | ICD-10-CM | POA: Diagnosis not present

## 2016-03-03 DIAGNOSIS — N186 End stage renal disease: Secondary | ICD-10-CM | POA: Diagnosis not present

## 2016-03-03 DIAGNOSIS — N2581 Secondary hyperparathyroidism of renal origin: Secondary | ICD-10-CM | POA: Diagnosis not present

## 2016-03-03 DIAGNOSIS — E119 Type 2 diabetes mellitus without complications: Secondary | ICD-10-CM | POA: Diagnosis not present

## 2016-03-06 DIAGNOSIS — N186 End stage renal disease: Secondary | ICD-10-CM | POA: Diagnosis not present

## 2016-03-06 DIAGNOSIS — E119 Type 2 diabetes mellitus without complications: Secondary | ICD-10-CM | POA: Diagnosis not present

## 2016-03-06 DIAGNOSIS — D509 Iron deficiency anemia, unspecified: Secondary | ICD-10-CM | POA: Diagnosis not present

## 2016-03-06 DIAGNOSIS — D631 Anemia in chronic kidney disease: Secondary | ICD-10-CM | POA: Diagnosis not present

## 2016-03-06 DIAGNOSIS — N2581 Secondary hyperparathyroidism of renal origin: Secondary | ICD-10-CM | POA: Diagnosis not present

## 2016-03-07 ENCOUNTER — Emergency Department (HOSPITAL_COMMUNITY): Payer: Medicare Other

## 2016-03-07 ENCOUNTER — Emergency Department (HOSPITAL_COMMUNITY)
Admission: EM | Admit: 2016-03-07 | Discharge: 2016-03-07 | Disposition: A | Discharge status: Home / Self Care | Payer: Medicare Other | Source: Home / Self Care | Attending: Emergency Medicine | Admitting: Emergency Medicine

## 2016-03-07 ENCOUNTER — Encounter (HOSPITAL_COMMUNITY): Payer: Self-pay

## 2016-03-07 DIAGNOSIS — I5032 Chronic diastolic (congestive) heart failure: Secondary | ICD-10-CM | POA: Insufficient documentation

## 2016-03-07 DIAGNOSIS — Z87891 Personal history of nicotine dependence: Secondary | ICD-10-CM

## 2016-03-07 DIAGNOSIS — E785 Hyperlipidemia, unspecified: Secondary | ICD-10-CM

## 2016-03-07 DIAGNOSIS — E1122 Type 2 diabetes mellitus with diabetic chronic kidney disease: Secondary | ICD-10-CM

## 2016-03-07 DIAGNOSIS — N186 End stage renal disease: Secondary | ICD-10-CM

## 2016-03-07 DIAGNOSIS — I5033 Acute on chronic diastolic (congestive) heart failure: Secondary | ICD-10-CM | POA: Diagnosis not present

## 2016-03-07 DIAGNOSIS — R079 Chest pain, unspecified: Secondary | ICD-10-CM

## 2016-03-07 DIAGNOSIS — Z79899 Other long term (current) drug therapy: Secondary | ICD-10-CM | POA: Insufficient documentation

## 2016-03-07 DIAGNOSIS — I2510 Atherosclerotic heart disease of native coronary artery without angina pectoris: Secondary | ICD-10-CM | POA: Insufficient documentation

## 2016-03-07 DIAGNOSIS — I132 Hypertensive heart and chronic kidney disease with heart failure and with stage 5 chronic kidney disease, or end stage renal disease: Secondary | ICD-10-CM | POA: Diagnosis not present

## 2016-03-07 DIAGNOSIS — E875 Hyperkalemia: Secondary | ICD-10-CM | POA: Diagnosis not present

## 2016-03-07 DIAGNOSIS — E119 Type 2 diabetes mellitus without complications: Secondary | ICD-10-CM | POA: Insufficient documentation

## 2016-03-07 DIAGNOSIS — R072 Precordial pain: Secondary | ICD-10-CM

## 2016-03-07 DIAGNOSIS — J810 Acute pulmonary edema: Secondary | ICD-10-CM | POA: Diagnosis not present

## 2016-03-07 DIAGNOSIS — R0602 Shortness of breath: Secondary | ICD-10-CM | POA: Diagnosis not present

## 2016-03-07 DIAGNOSIS — E8889 Other specified metabolic disorders: Secondary | ICD-10-CM | POA: Diagnosis not present

## 2016-03-07 DIAGNOSIS — R222 Localized swelling, mass and lump, trunk: Secondary | ICD-10-CM | POA: Insufficient documentation

## 2016-03-07 DIAGNOSIS — Z992 Dependence on renal dialysis: Secondary | ICD-10-CM | POA: Insufficient documentation

## 2016-03-07 DIAGNOSIS — G8929 Other chronic pain: Secondary | ICD-10-CM

## 2016-03-07 LAB — COMPREHENSIVE METABOLIC PANEL
ALK PHOS: 106 U/L (ref 38–126)
ALT: 13 U/L — ABNORMAL LOW (ref 17–63)
ANION GAP: 12 (ref 5–15)
AST: 11 U/L — ABNORMAL LOW (ref 15–41)
Albumin: 3.7 g/dL (ref 3.5–5.0)
BILIRUBIN TOTAL: 0.8 mg/dL (ref 0.3–1.2)
BUN: 32 mg/dL — ABNORMAL HIGH (ref 6–20)
CALCIUM: 9.6 mg/dL (ref 8.9–10.3)
CO2: 27 mmol/L (ref 22–32)
Chloride: 100 mmol/L — ABNORMAL LOW (ref 101–111)
Creatinine, Ser: 7.58 mg/dL — ABNORMAL HIGH (ref 0.61–1.24)
GFR calc non Af Amer: 7 mL/min — ABNORMAL LOW (ref >60)
GFR, EST AFRICAN AMERICAN: 8 mL/min — AB (ref >60)
Glucose, Bld: 107 mg/dL — ABNORMAL HIGH (ref 65–99)
POTASSIUM: 3.7 mmol/L (ref 3.5–5.1)
Sodium: 139 mmol/L (ref 135–145)
TOTAL PROTEIN: 7.6 g/dL (ref 6.5–8.1)

## 2016-03-07 LAB — CBC
HEMATOCRIT: 34.8 % — AB (ref 39.0–52.0)
HEMOGLOBIN: 10.4 g/dL — AB (ref 13.0–17.0)
MCH: 23.8 pg — AB (ref 26.0–34.0)
MCHC: 29.9 g/dL — AB (ref 30.0–36.0)
MCV: 79.6 fL (ref 78.0–100.0)
Platelets: 136 10*3/uL — ABNORMAL LOW (ref 150–400)
RBC: 4.37 MIL/uL (ref 4.22–5.81)
RDW: 20.1 % — AB (ref 11.5–15.5)
WBC: 5.7 10*3/uL (ref 4.0–10.5)

## 2016-03-07 LAB — I-STAT TROPONIN, ED: TROPONIN I, POC: 0.02 ng/mL (ref 0.00–0.08)

## 2016-03-07 LAB — PROTIME-INR
INR: 1.37 (ref 0.00–1.49)
PROTHROMBIN TIME: 17 s — AB (ref 11.6–15.2)

## 2016-03-07 LAB — APTT: aPTT: 41 seconds — ABNORMAL HIGH (ref 24–37)

## 2016-03-07 NOTE — ED Notes (Signed)
Pt presents with onset of mid-sternal chest pain that began last night.  +shortness of breath, pt reports rubbing chest last night noting a "knot" to same area.  Pt denies any injury.

## 2016-03-07 NOTE — ED Provider Notes (Signed)
CSN: [id number]     Arrival date & time 03/07/16  1034 History   First MD Initiated Contact with Patient 03/07/16 1223     Chief Complaint  Patient presents with  . Chest Pain     (Consider location/radiation/quality/duration/timing/severity/associated sxs/prior Treatment) HPI Comments: 60yo M w/ extensive PMH including ESRD on HD, dCHF, T2DM, CAD who p/w chest pain. Pt states that last night, he had an episode of substernal chest pain which is very common for him and similar to chest pain that he has "all the time for years." However, around the same time, he found a small knot on his chest which she has not noticed before. The knot is sore but no other skin changes. He became concerned about the knot which is why he presented today. He states the chest pain is similar to pain that he has all the time, with some associated shortness of breath. Otherwise he feels well and has been on schedule with his dialysis sessions. Last dialysis was yesterday. He reports that he saw his nephrologist recently who told him he was doing well. He denies any other complaints. No recent illness.  Patient is a 60 y.o. male presenting with chest pain. The history is provided by the patient.  Chest Pain   Past Medical History  Diagnosis Date  . Hypertension   . Hematochezia     a. 2014: colonscopy, which showed moderately-sized internal hemorrhoids, two 66mm polyps in transverse colon and ascending colon that were resected, five 2-32mm polyps in sigmoid colon, descending colon, transverse colon, and ascending colon that were resected. An upper endoscopy was performed and showed normal esophagus, stomach, and duodenum.  . Hematuria     a. H/o hematuria 2014 with cystoscopy that was unrevealing for his source of hematuria. He underwent a kidney ultrasound on 10/14 that showed mildly echogenic and scarred kidneys compatible with medical renal disease, without hydronephrosis or renal calculi.  [name] Anemia   . CAD  (coronary artery disease)     a. per [facility] s/p 3.27mm x 78mm Vision BMS to mid LAD 12/2009 and Xience DES to mid LAD 10/2010.  . Colon polyps   . Chronic diastolic CHF (congestive heart failure) ([place])   . Hyperlipidemia   . Anginal pain ([place])   . Heart murmur   . 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 Type II diabetes mellitus ([place])   . History of blood transfusion     "had colonoscopy done; they had to give me some blood"  . Daily headache   . ESRD on dialysis [facility]) since ~ 2008    "[place]; TTS" (07/21/2015)  . On home oxygen therapy     "2L prn" (07/21/2015)  . Renal insufficiency    Past Surgical History  Procedure Laterality Date  . Left heart catheterization with coronary angiogram N/A 11/23/2014    Procedure: LEFT HEART CATHETERIZATION WITH CORONARY ANGIOGRAM;  Surgeon: Troy Sine, MD;  Location: [place] CATH LAB;  Service: Cardiovascular;  Laterality: N/A;  . Lithotripsy  X1  . Cystoscopy w/ stone manipulation  X2?  . Cardiac catheterization  "several"  . Coronary angioplasty with stent placement  "several"  . Eye surgery Bilateral     "laser OR for hemorrhage"  . Av fistula placement Left ~ 2007    "upper arm"   Family History  Problem Relation Age of Onset  . Hypertension    . Bone cancer Mother   .  Anuerysm Father   . Diabetes type II Daughter    Social History  Substance Use Topics  . Smoking status: Former Smoker -- 0.50 packs/day for 8 years    Types: Cigarettes    Quit date: 12/06/2010  . Smokeless tobacco: Never Used  . Alcohol Use: No    Review of Systems  Cardiovascular: Positive for chest pain.   10 Systems reviewed and are negative for acute change except as noted in the HPI.    Allergies  Enalapril  Home Medications   Prior to Admission medications   Medication Sig Start Date End Date Taking? Authorizing Provider  albuterol (PROVENTIL HFA;VENTOLIN HFA) 108 (90 Base) MCG/ACT inhaler Inhale 1-2 puffs  into the lungs every 6 (six) hours as needed for wheezing or shortness of breath. 01/19/16   Delsa Grana, PA-C  amitriptyline (ELAVIL) 100 MG tablet Take 1 tablet (100 mg total) by mouth at bedtime. 07/27/15   Shela Leff, MD  amLODipine (NORVASC) 10 MG tablet Take 1 tablet (10 mg total) by mouth daily. 10/30/15   Ripudeep Krystal Eaton, MD  aspirin 81 MG chewable tablet Chew 1 tablet (81 mg total) by mouth daily. 07/22/15   Iline Oven, MD  atorvastatin (LIPITOR) 20 MG tablet Take 20 mg by mouth at bedtime.     Historical Provider, MD  carvedilol (COREG) 25 MG tablet Take 2 tablets (50 mg total) by mouth 2 (two) times daily. 10/30/15   Ripudeep Krystal Eaton, MD  cinacalcet (SENSIPAR) 90 MG tablet Take 90 mg by mouth at bedtime.     Historical Provider, MD  clopidogrel (PLAVIX) 75 MG tablet Take 1 tablet (75 mg total) by mouth daily. 11/24/14   Juluis Mire, MD  Darbepoetin Alfa (ARANESP) 25 MCG/0.42ML SOSY injection Inject 0.42 mLs (25 mcg total) into the vein every Tuesday with hemodialysis. 12/15/14   Geradine Girt, DO  dextromethorphan-guaiFENesin (TUSSIN DM) 10-100 MG/5ML liquid Take 10 mLs by mouth every 4 (four) hours as needed for cough. 01/03/16   Thurnell Lose, MD  doxercalciferol (HECTOROL) 4 MCG/2ML injection Inject 1 mL (2 mcg total) into the vein Every Tuesday,Thursday,and Saturday with dialysis. 12/15/14   Geradine Girt, DO  ferric gluconate 62.5 mg in sodium chloride 0.9 % 100 mL Inject 62.5 mg into the vein every Thursday with hemodialysis. 12/16/14   Geradine Girt, DO  guaiFENesin-codeine 100-10 MG/5ML syrup Take 5 mLs by mouth 3 (three) times daily as needed for cough. Patient not taking: Reported on 02/19/2016 01/19/16   Delsa Grana, PA-C  isosorbide mononitrate (IMDUR) 30 MG 24 hr tablet Take 1 tablet (30 mg total) by mouth daily. Patient not taking: Reported on 01/19/2016 10/30/15   Ripudeep Krystal Eaton, MD  losartan (COZAAR) 100 MG tablet Take 100 mg by mouth daily.    Historical Provider, MD   multivitamin (RENA-VIT) TABS tablet Take 1 tablet by mouth at bedtime. 12/15/14   Geradine Girt, DO  nitroGLYCERIN (NITROSTAT) 0.4 MG SL tablet Place 1 tablet (0.4 mg total) under the tongue every 5 (five) minutes as needed for chest pain. 06/05/15   Janece Canterbury, MD  pantoprazole (PROTONIX) 40 MG tablet Take 1 tablet (40 mg total) by mouth daily. 06/05/15   Janece Canterbury, MD  pregabalin (LYRICA) 50 MG capsule Take 1 capsule (50 mg total) by mouth daily. 12/15/14   Geradine Girt, DO  sevelamer carbonate (RENVELA) 800 MG tablet Take 2,400 mg by mouth 3 (three) times daily with meals.  Historical Provider, MD  simethicone (GAS-X) 80 MG chewable tablet Chew 1 tablet (80 mg total) by mouth every 6 (six) hours as needed for flatulence. 08/29/15   Collier Salina, MD  sucroferric oxyhydroxide (VELPHORO) 500 MG chewable tablet Chew 1,000 mg by mouth 3 (three) times daily with meals.    Historical Provider, MD  venlafaxine XR (EFFEXOR XR) 75 MG 24 hr capsule Take 1 capsule (75 mg total) by mouth daily with breakfast. 07/27/15   Shela Leff, MD   BP 168/90 mmHg  Pulse 92  Temp(Src) 98.4 F (36.9 C) (Oral)  Resp 18  Ht 6' (1.829 m)  Wt 205 lb 7.5 oz (93.2 kg)  BMI 27.86 kg/m2  SpO2 98% Physical Exam  Constitutional: He is oriented to person, place, and time. He appears well-developed and well-nourished. No distress.  HENT:  Head: Normocephalic and atraumatic.  Eyes: Conjunctivae are normal.  Neck: Neck supple.  Cardiovascular: Regular rhythm and normal heart sounds.  Tachycardia present.   No murmur heard. Pulmonary/Chest: Effort normal and breath sounds normal.  Pea-sized mobile mass on lower sternum, no skin changes  Abdominal: Soft. Bowel sounds are normal. He exhibits no distension. There is no tenderness.  Musculoskeletal: He exhibits no edema.  Neurological: He is alert and oriented to person, place, and time.  Fluent speech  Skin: Skin is warm and dry. No rash noted. No  erythema.  Psychiatric: He has a normal mood and affect. Judgment normal.  Nursing note and vitals reviewed.   ED Course  Procedures (including critical care time) Labs Review Labs Reviewed  APTT - Abnormal; Notable for the following:    aPTT 41 (*)    All other components within normal limits  CBC - Abnormal; Notable for the following:    Hemoglobin 10.4 (*)    HCT 34.8 (*)    MCH 23.8 (*)    MCHC 29.9 (*)    RDW 20.1 (*)    Platelets 136 (*)    All other components within normal limits  COMPREHENSIVE METABOLIC PANEL - Abnormal; Notable for the following:    Chloride 100 (*)    Glucose, Bld 107 (*)    BUN 32 (*)    Creatinine, Ser 7.58 (*)    AST 11 (*)    ALT 13 (*)    GFR calc non Af Amer 7 (*)    GFR calc Af Amer 8 (*)    All other components within normal limits  PROTIME-INR - Abnormal; Notable for the following:    Prothrombin Time 17.0 (*)    All other components within normal limits  I-STAT TROPOININ, ED    Imaging Review Dg Chest 2 View  03/07/2016  CLINICAL DATA:  Central chest pain for 2 days EXAM: CHEST  2 VIEW COMPARISON:  02/19/2016 FINDINGS: Cardiac shadow is stable. Interstitial changes are again identified which may represent a degree of interstitial edema. No focal confluent infiltrate is seen. The overall appearance is stable from the prior study. IMPRESSION: No significant interval change from the prior exam. Electronically Signed   By: Inez Catalina M.D.   On: 03/07/2016 12:51   I have personally reviewed and evaluated these lab results as part of my medical decision-making.   EKG Interpretation   Date/Time:  Wednesday Mar 07 2016 10:37:40 EDT Ventricular Rate:  109 PR Interval:  164 QRS Duration: 98 QT Interval:  360 QTC Calculation: 484 R Axis:   67 Text Interpretation:  Sinus tachycardia Nonspecific ST abnormality  Abnormal  ECG No significant change since last tracing Confirmed by Daiki Dicostanzo  MD, Wake Conlee [id number]) on 03/07/2016 11:16:59 AM       MDM   Final diagnoses:  Chest wall mass  Chest pain, unspecified chest pain type   Pt p/w lump on chest that he noticed last night. He intermittently has chest pain chronically but states that nothing has changed recently regarding that symptom, he was just concerned about this new mass. On exam, he had a pea-sized mobile mass without skin changes to suggest acute infection. He has no other infectious symptoms and states that he feels well. Instructed to follow-up with PCP if mass not resolved in 1-2 weeks and at that time they may consider referral for biopsy versus removal. His lab work here shows normal troponin and labs consistent with his baseline. Chest x-ray negative acute. No signs of volume overload. He is hypertensive and borderline tachycardic, which appears to be his usual VS based on multiple previous notes. Pt discharged in satisfactory condition.  Sharlett Iles, MD 03/07/16 [phone]

## 2016-03-07 NOTE — ED Notes (Signed)
Pt c/o a "knot" on chest wall that he just noticed last night.  Pt states he has had chest pain for years but just found the knot.  Pt advises when he found it last night it hurt.

## 2016-03-08 DIAGNOSIS — E119 Type 2 diabetes mellitus without complications: Secondary | ICD-10-CM | POA: Diagnosis not present

## 2016-03-08 DIAGNOSIS — N186 End stage renal disease: Secondary | ICD-10-CM | POA: Diagnosis not present

## 2016-03-08 DIAGNOSIS — D631 Anemia in chronic kidney disease: Secondary | ICD-10-CM | POA: Diagnosis not present

## 2016-03-08 DIAGNOSIS — N2581 Secondary hyperparathyroidism of renal origin: Secondary | ICD-10-CM | POA: Diagnosis not present

## 2016-03-09 ENCOUNTER — Emergency Department (HOSPITAL_COMMUNITY): Payer: Medicare Other

## 2016-03-09 ENCOUNTER — Encounter (HOSPITAL_COMMUNITY): Payer: Self-pay | Admitting: Emergency Medicine

## 2016-03-09 ENCOUNTER — Inpatient Hospital Stay (HOSPITAL_COMMUNITY)
Admission: EM | Admit: 2016-03-09 | Discharge: 2016-03-12 | DRG: 291 | Disposition: A | Discharge status: Home / Self Care | Payer: Medicare Other | Attending: Internal Medicine | Admitting: Internal Medicine

## 2016-03-09 DIAGNOSIS — R0602 Shortness of breath: Secondary | ICD-10-CM | POA: Diagnosis not present

## 2016-03-09 DIAGNOSIS — R0789 Other chest pain: Secondary | ICD-10-CM | POA: Diagnosis present

## 2016-03-09 DIAGNOSIS — E1122 Type 2 diabetes mellitus with diabetic chronic kidney disease: Secondary | ICD-10-CM | POA: Diagnosis present

## 2016-03-09 DIAGNOSIS — Z992 Dependence on renal dialysis: Secondary | ICD-10-CM

## 2016-03-09 DIAGNOSIS — Z5181 Encounter for therapeutic drug level monitoring: Secondary | ICD-10-CM | POA: Diagnosis not present

## 2016-03-09 DIAGNOSIS — I5033 Acute on chronic diastolic (congestive) heart failure: Secondary | ICD-10-CM | POA: Diagnosis present

## 2016-03-09 DIAGNOSIS — Z8611 Personal history of tuberculosis: Secondary | ICD-10-CM

## 2016-03-09 DIAGNOSIS — E119 Type 2 diabetes mellitus without complications: Secondary | ICD-10-CM

## 2016-03-09 DIAGNOSIS — D631 Anemia in chronic kidney disease: Secondary | ICD-10-CM | POA: Diagnosis present

## 2016-03-09 DIAGNOSIS — E785 Hyperlipidemia, unspecified: Secondary | ICD-10-CM | POA: Diagnosis present

## 2016-03-09 DIAGNOSIS — Z9861 Coronary angioplasty status: Secondary | ICD-10-CM

## 2016-03-09 DIAGNOSIS — J811 Chronic pulmonary edema: Secondary | ICD-10-CM | POA: Diagnosis present

## 2016-03-09 DIAGNOSIS — Z955 Presence of coronary angioplasty implant and graft: Secondary | ICD-10-CM

## 2016-03-09 DIAGNOSIS — I110 Hypertensive heart disease with heart failure: Secondary | ICD-10-CM | POA: Insufficient documentation

## 2016-03-09 DIAGNOSIS — Z86010 Personal history of colonic polyps: Secondary | ICD-10-CM

## 2016-03-09 DIAGNOSIS — Z9115 Patient's noncompliance with renal dialysis: Secondary | ICD-10-CM

## 2016-03-09 DIAGNOSIS — E875 Hyperkalemia: Secondary | ICD-10-CM

## 2016-03-09 DIAGNOSIS — Z7982 Long term (current) use of aspirin: Secondary | ICD-10-CM

## 2016-03-09 DIAGNOSIS — E8889 Other specified metabolic disorders: Secondary | ICD-10-CM | POA: Diagnosis present

## 2016-03-09 DIAGNOSIS — I340 Nonrheumatic mitral (valve) insufficiency: Secondary | ICD-10-CM | POA: Diagnosis present

## 2016-03-09 DIAGNOSIS — I132 Hypertensive heart and chronic kidney disease with heart failure and with stage 5 chronic kidney disease, or end stage renal disease: Principal | ICD-10-CM | POA: Diagnosis present

## 2016-03-09 DIAGNOSIS — J810 Acute pulmonary edema: Secondary | ICD-10-CM

## 2016-03-09 DIAGNOSIS — N186 End stage renal disease: Secondary | ICD-10-CM | POA: Diagnosis present

## 2016-03-09 DIAGNOSIS — Z9981 Dependence on supplemental oxygen: Secondary | ICD-10-CM

## 2016-03-09 DIAGNOSIS — I2510 Atherosclerotic heart disease of native coronary artery without angina pectoris: Secondary | ICD-10-CM

## 2016-03-09 DIAGNOSIS — Z7902 Long term (current) use of antithrombotics/antiplatelets: Secondary | ICD-10-CM

## 2016-03-09 DIAGNOSIS — R0600 Dyspnea, unspecified: Secondary | ICD-10-CM

## 2016-03-09 DIAGNOSIS — R079 Chest pain, unspecified: Secondary | ICD-10-CM

## 2016-03-09 DIAGNOSIS — I10 Essential (primary) hypertension: Secondary | ICD-10-CM | POA: Diagnosis present

## 2016-03-09 DIAGNOSIS — R0902 Hypoxemia: Secondary | ICD-10-CM | POA: Diagnosis present

## 2016-03-09 DIAGNOSIS — Z87891 Personal history of nicotine dependence: Secondary | ICD-10-CM | POA: Diagnosis not present

## 2016-03-09 MED ORDER — NITROGLYCERIN 0.4 MG SL SUBL
0.4000 mg | SUBLINGUAL_TABLET | SUBLINGUAL | Status: DC | PRN
  Administered 2016-03-09 (×2): 0.4 mg via SUBLINGUAL
  Filled 2016-03-09: qty 1

## 2016-03-09 MED ORDER — ASPIRIN 81 MG PO CHEW
324.0000 mg | CHEWABLE_TABLET | Freq: Once | ORAL | Status: AC
Start: 2016-03-09 — End: 2016-03-09
  Administered 2016-03-09: 324 mg via ORAL
  Filled 2016-03-09: qty 4

## 2016-03-09 NOTE — ED Notes (Signed)
Per EMS, pt coming from home. Reported feeling SOB as early as 1500 but got bad approx 2100 tonight. +Emesis x3. States last time he feel like this, his K+ was elevated. Pt is on dialysis T-R-Sa, compliant with regime and states last dialysis was normal. +CP but recently seen for chest wall pain d/t contusion. Feeling chest pressure now as well. Given 5mg  albuterol from EMS.

## 2016-03-09 NOTE — ED Provider Notes (Signed)
CSN: [id number]     Arrival date & time 03/09/16  2310 History  By signing my name below, I, Hansel Feinstein, attest that this documentation has been prepared under the direction and in the presence of Ripley Fraise, MD. Electronically Signed: Hansel Feinstein, ED Scribe. 03/09/2016. 11:22 PM.     Chief Complaint  Patient presents with  . Shortness of Breath   Patient is a 59 y.o. male presenting with chest pain. The history is provided by the patient. No language interpreter was used.  Chest Pain Pain location:  Substernal area Pain quality: pressure   Pain radiates to:  Does not radiate Pain radiates to the back: no   Pain severity:  Moderate Onset quality:  Gradual Duration:  8 hours Timing:  Constant Chronicity:  Recurrent Context: at rest   Relieved by:  None tried Worsened by:  Nothing tried Ineffective treatments:  None tried Associated symptoms: abdominal pain, nausea, shortness of breath and vomiting   Associated symptoms: no cough, no dizziness and no fever   Risk factors: coronary artery disease and diabetes mellitus    HPI Comments: [name] [name] is a 60 y.o. male w/ extensive PMH including ESRD on HD, dCHF, DM2, CAD, HTN who presents to the Emergency Department complaining of moderate substernal CP and pressure onset at 3PM while at rest with associated SOB. Pt also complains of abdominal pain, nausea and 3 episodes of emesis. He was last dialyzed yesterday and had a full session. Pt reports frequent h/o similar symptoms with hyperkalemia. Pt denies fever, chills, cough, diarrhea, dizziness.   Past Medical History  Diagnosis Date  . Hypertension   . Hematochezia     a. 2014: colonscopy, which showed moderately-sized internal hemorrhoids, two 61mm polyps in transverse colon and ascending colon that were resected, five 2-42mm polyps in sigmoid colon, descending colon, transverse colon, and ascending colon that were resected. An upper endoscopy was performed and showed normal  esophagus, stomach, and duodenum.  . Hematuria     a. H/o hematuria 2014 with cystoscopy that was unrevealing for his source of hematuria. He underwent a kidney ultrasound on 10/14 that showed mildly echogenic and scarred kidneys compatible with medical renal disease, without hydronephrosis or renal calculi.  [name] Anemia   . CAD (coronary artery disease)     a. per [facility] s/p 3.63mm x 58mm Vision BMS to mid LAD 12/2009 and Xience DES to mid LAD 10/2010.  . Colon polyps   . Chronic diastolic CHF (congestive heart failure) ([place])   . Hyperlipidemia   . Anginal pain ([place])   . Heart murmur   . 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 Type II diabetes mellitus ([place])   . History of blood transfusion     "had colonoscopy done; they had to give me some blood"  . Daily headache   . ESRD on dialysis [facility]) since ~ 2008    "[place]; TTS" (07/21/2015)  . On home oxygen therapy     "2L prn" (07/21/2015)  . Renal insufficiency    Past Surgical History  Procedure Laterality Date  . Left heart catheterization with coronary angiogram N/A 11/23/2014    Procedure: LEFT HEART CATHETERIZATION WITH CORONARY ANGIOGRAM;  Surgeon: Troy Sine, MD;  Location: [facility] CATH LAB;  Service: Cardiovascular;  Laterality: N/A;  . Lithotripsy  X1  . Cystoscopy w/ stone manipulation  X2?  . Cardiac catheterization  "several"  . Coronary angioplasty with stent  placement  "several"  . Eye surgery Bilateral     "laser OR for hemorrhage"  . Av fistula placement Left ~ 2007    "upper arm"   Family History  Problem Relation Age of Onset  . Hypertension    . Bone cancer Mother   . Anuerysm Father   . Diabetes type II Daughter    Social History  Substance Use Topics  . Smoking status: Former Smoker -- 0.50 packs/day for 8 years    Types: Cigarettes    Quit date: 12/06/2010  . Smokeless tobacco: Never Used  . Alcohol Use: No    Review of Systems  Constitutional: Negative for fever  and chills.  Respiratory: Positive for shortness of breath. Negative for cough.   Cardiovascular: Positive for chest pain.  Gastrointestinal: Positive for nausea, vomiting and abdominal pain. Negative for diarrhea.  Neurological: Negative for dizziness.  All other systems reviewed and are negative.  Allergies  Enalapril  Home Medications   Prior to Admission medications   Medication Sig Start Date End Date Taking? Authorizing Provider  albuterol (PROVENTIL HFA;VENTOLIN HFA) 108 (90 Base) MCG/ACT inhaler Inhale 1-2 puffs into the lungs every 6 (six) hours as needed for wheezing or shortness of breath. 01/19/16   Delsa Grana, PA-C  amitriptyline (ELAVIL) 100 MG tablet Take 1 tablet (100 mg total) by mouth at bedtime. 07/27/15   Shela Leff, MD  amLODipine (NORVASC) 10 MG tablet Take 1 tablet (10 mg total) by mouth daily. 10/30/15   Ripudeep Krystal Eaton, MD  aspirin 81 MG chewable tablet Chew 1 tablet (81 mg total) by mouth daily. 07/22/15   Iline Oven, MD  atorvastatin (LIPITOR) 20 MG tablet Take 20 mg by mouth at bedtime.     Historical Provider, MD  carvedilol (COREG) 25 MG tablet Take 2 tablets (50 mg total) by mouth 2 (two) times daily. 10/30/15   Ripudeep Krystal Eaton, MD  cinacalcet (SENSIPAR) 90 MG tablet Take 90 mg by mouth at bedtime.     Historical Provider, MD  clopidogrel (PLAVIX) 75 MG tablet Take 1 tablet (75 mg total) by mouth daily. 11/24/14   Juluis Mire, MD  Darbepoetin Alfa (ARANESP) 25 MCG/0.42ML SOSY injection Inject 0.42 mLs (25 mcg total) into the vein every Tuesday with hemodialysis. 12/15/14   Geradine Girt, DO  dextromethorphan-guaiFENesin (TUSSIN DM) 10-100 MG/5ML liquid Take 10 mLs by mouth every 4 (four) hours as needed for cough. 01/03/16   Thurnell Lose, MD  doxercalciferol (HECTOROL) 4 MCG/2ML injection Inject 1 mL (2 mcg total) into the vein Every Tuesday,Thursday,and Saturday with dialysis. 12/15/14   Geradine Girt, DO  ferric gluconate 62.5 mg in sodium  chloride 0.9 % 100 mL Inject 62.5 mg into the vein every Thursday with hemodialysis. 12/16/14   Geradine Girt, DO  guaiFENesin-codeine 100-10 MG/5ML syrup Take 5 mLs by mouth 3 (three) times daily as needed for cough. Patient not taking: Reported on 02/19/2016 01/19/16   Delsa Grana, PA-C  isosorbide mononitrate (IMDUR) 30 MG 24 hr tablet Take 1 tablet (30 mg total) by mouth daily. Patient not taking: Reported on 01/19/2016 10/30/15   Ripudeep Krystal Eaton, MD  losartan (COZAAR) 100 MG tablet Take 100 mg by mouth daily.    Historical Provider, MD  multivitamin (RENA-VIT) TABS tablet Take 1 tablet by mouth at bedtime. 12/15/14   Geradine Girt, DO  nitroGLYCERIN (NITROSTAT) 0.4 MG SL tablet Place 1 tablet (0.4 mg total) under the tongue every 5 (five) minutes  as needed for chest pain. 06/05/15   Janece Canterbury, MD  pantoprazole (PROTONIX) 40 MG tablet Take 1 tablet (40 mg total) by mouth daily. 06/05/15   Janece Canterbury, MD  pregabalin (LYRICA) 50 MG capsule Take 1 capsule (50 mg total) by mouth daily. 12/15/14   Geradine Girt, DO  sevelamer carbonate (RENVELA) 800 MG tablet Take 2,400 mg by mouth 3 (three) times daily with meals.     Historical Provider, MD  simethicone (GAS-X) 80 MG chewable tablet Chew 1 tablet (80 mg total) by mouth every 6 (six) hours as needed for flatulence. 08/29/15   Collier Salina, MD  sucroferric oxyhydroxide (VELPHORO) 500 MG chewable tablet Chew 1,000 mg by mouth 3 (three) times daily with meals.    Historical Provider, MD  venlafaxine XR (EFFEXOR XR) 75 MG 24 hr capsule Take 1 capsule (75 mg total) by mouth daily with breakfast. 07/27/15   Shela Leff, MD   SpO2 99% Physical Exam CONSTITUTIONAL: Well developed/well nourished. Distress noted.  HEAD: Normocephalic/atraumatic EYES: EOMI/PERRL ENMT: Mucous membranes moist NECK: supple no meningeal signs SPINE/BACK:entire spine nontender CV: S1/S2 noted, no murmurs/rubs/gallops noted LUNGS: Crackles bilaterally. Tachypnea  noted.  ABDOMEN: soft, nontender NEURO: Pt is awake/alert/appropriate, moves all extremitiesx4.   EXTREMITIES: pulses normal/equal, full ROM, dialysis access to left UE with thrill noted SKIN: warm, color normal PSYCH: no abnormalities of mood noted, alert and oriented to situation   ED Course  Procedures  Procedure note - IV access/vascular acces Right EJ IV was placed by myself Area was appropriately cleansed IV was flushed without any difficulty Patient tolerated procedure well  CRITICAL CARE Performed by: Sharyon Cable Total critical care time: 40 minutes Critical care time was exclusive of separately billable procedures and treating other patients. Critical care was necessary to treat or prevent imminent or life-threatening deterioration. Critical care was time spent personally by me on the following activities: development of treatment plan with patient and/or surrogate as well as nursing, discussions with consultants, evaluation of patient's response to treatment, examination of patient, obtaining history from patient or surrogate, ordering and performing treatments and interventions, ordering and review of laboratory studies, ordering and review of radiographic studies, pulse oximetry and re-evaluation of patient's condition. PATIENT WITH HYPERKALEMIA, POTASSIUM> 7, WILL NEED EMERGENT DIALYSIS DIAGNOSTIC STUDIES: Oxygen Saturation is 99% on RA, normal by my interpretation.    COORDINATION OF CARE: 11:13 PM Discussed treatment plan with pt at bedside which includes lab work, CXR, EKG and pt agreed to plan. 12:17 AM Patient with hyperkalemia, and also acute pulmonary edema IV access has been established and calcium ordered D/w nephrology who will arrange dialysis Will call hospitalist for admission  12:50 AM D/w dr Arnoldo Morale with [facility] Will admit to stepdown Pt is now more tachypneic with pulmonary edema Will place on bipap to assist with SOB  Labs Review Labs Reviewed   I-STAT CHEM 8, ED - Abnormal; Notable for the following:    Sodium 134 (*)    Potassium 7.6 (*)    Chloride 99 (*)    BUN 56 (*)    Creatinine, Ser 10.30 (*)    Glucose, Bld 129 (*)    Calcium, Ion 1.01 (*)    Hemoglobin 10.5 (*)    HCT 31.0 (*)    All other components within normal limits  BASIC METABOLIC PANEL  CBC WITH DIFFERENTIAL/PLATELET  TROPONIN I  BRAIN NATRIURETIC PEPTIDE  CBC  DIFFERENTIAL    Imaging Review Dg Chest Portable 1 View  03/09/2016  CLINICAL DATA:  Acute onset of shortness of breath and cough. Initial encounter. EXAM: PORTABLE CHEST 1 VIEW COMPARISON:  Chest radiograph performed 03/07/2016 FINDINGS: There is elevation of the left hemidiaphragm. Vascular congestion is noted. Increased interstitial markings raise concern for pulmonary edema. No definite pleural effusion or pneumothorax is seen. The cardiomediastinal silhouette is mildly enlarged. No acute osseous abnormalities are identified. IMPRESSION: Elevation of the left hemidiaphragm. Vascular congestion and mild cardiomegaly. Increased interstitial markings raise concern for pulmonary edema. Electronically Signed   By: Garald Balding M.D.   On: 03/09/2016 23:38   I have personally reviewed and evaluated these images and lab results as part of my medical decision-making.   EKG Interpretation   Date/Time:  Friday March 09 2016 23:12:00 EDT Ventricular Rate:  106 PR Interval:  146 QRS Duration: 102 QT Interval:  348 QTC Calculation: 462 R Axis:   78 Text Interpretation:  Sinus tachycardia T wave inversion No significant  change since last tracing Confirmed by Christy Gentles  MD, Grenda Lora ([id number]) on  03/09/2016 11:14:44 PM     Medications  nitroGLYCERIN (NITROSTAT) SL tablet 0.4 mg (0.4 mg Sublingual Given 03/09/16 2351)  aspirin chewable tablet 324 mg (324 mg Oral Given 03/09/16 2327)  calcium gluconate inj 10% (1 g) URGENT USE ONLY! (1 g Intravenous Given 03/10/16 0022)  dextrose 50 % solution 50 mL (50 mLs  Intravenous Given 03/10/16 0026)  insulin aspart (novoLOG) injection 10 Units (10 Units Intravenous Given 03/10/16 0028)    MDM   Final diagnoses:  Hyperkalemia  Acute pulmonary edema ([place])    Nursing notes including past medical history and social history reviewed and considered in documentation xrays/imaging reviewed by myself and considered during evaluation Labs/vital reviewed myself and considered during evaluation    I personally performed the services described in this documentation, which was scribed in my presence. The recorded information has been reviewed and is accurate.       Ripley Fraise, MD 03/10/16 [phone]

## 2016-03-10 DIAGNOSIS — I10 Essential (primary) hypertension: Secondary | ICD-10-CM

## 2016-03-10 DIAGNOSIS — Z9861 Coronary angioplasty status: Secondary | ICD-10-CM

## 2016-03-10 DIAGNOSIS — I5033 Acute on chronic diastolic (congestive) heart failure: Secondary | ICD-10-CM

## 2016-03-10 DIAGNOSIS — N2581 Secondary hyperparathyroidism of renal origin: Secondary | ICD-10-CM | POA: Diagnosis not present

## 2016-03-10 DIAGNOSIS — E785 Hyperlipidemia, unspecified: Secondary | ICD-10-CM | POA: Diagnosis present

## 2016-03-10 DIAGNOSIS — D631 Anemia in chronic kidney disease: Secondary | ICD-10-CM | POA: Diagnosis present

## 2016-03-10 DIAGNOSIS — E875 Hyperkalemia: Secondary | ICD-10-CM | POA: Diagnosis present

## 2016-03-10 DIAGNOSIS — R0602 Shortness of breath: Secondary | ICD-10-CM | POA: Diagnosis not present

## 2016-03-10 DIAGNOSIS — J810 Acute pulmonary edema: Secondary | ICD-10-CM

## 2016-03-10 DIAGNOSIS — I340 Nonrheumatic mitral (valve) insufficiency: Secondary | ICD-10-CM | POA: Diagnosis present

## 2016-03-10 DIAGNOSIS — I120 Hypertensive chronic kidney disease with stage 5 chronic kidney disease or end stage renal disease: Secondary | ICD-10-CM | POA: Diagnosis not present

## 2016-03-10 DIAGNOSIS — Z992 Dependence on renal dialysis: Secondary | ICD-10-CM

## 2016-03-10 DIAGNOSIS — I2510 Atherosclerotic heart disease of native coronary artery without angina pectoris: Secondary | ICD-10-CM

## 2016-03-10 DIAGNOSIS — Z7902 Long term (current) use of antithrombotics/antiplatelets: Secondary | ICD-10-CM | POA: Diagnosis not present

## 2016-03-10 DIAGNOSIS — E8889 Other specified metabolic disorders: Secondary | ICD-10-CM | POA: Diagnosis not present

## 2016-03-10 DIAGNOSIS — E119 Type 2 diabetes mellitus without complications: Secondary | ICD-10-CM

## 2016-03-10 DIAGNOSIS — E118 Type 2 diabetes mellitus with unspecified complications: Secondary | ICD-10-CM | POA: Diagnosis not present

## 2016-03-10 DIAGNOSIS — Z9981 Dependence on supplemental oxygen: Secondary | ICD-10-CM | POA: Diagnosis not present

## 2016-03-10 DIAGNOSIS — R072 Precordial pain: Secondary | ICD-10-CM | POA: Diagnosis not present

## 2016-03-10 DIAGNOSIS — Z955 Presence of coronary angioplasty implant and graft: Secondary | ICD-10-CM | POA: Diagnosis not present

## 2016-03-10 DIAGNOSIS — R079 Chest pain, unspecified: Secondary | ICD-10-CM | POA: Diagnosis not present

## 2016-03-10 DIAGNOSIS — J811 Chronic pulmonary edema: Secondary | ICD-10-CM | POA: Diagnosis present

## 2016-03-10 DIAGNOSIS — N186 End stage renal disease: Secondary | ICD-10-CM

## 2016-03-10 DIAGNOSIS — I369 Nonrheumatic tricuspid valve disorder, unspecified: Secondary | ICD-10-CM | POA: Diagnosis not present

## 2016-03-10 DIAGNOSIS — R0789 Other chest pain: Secondary | ICD-10-CM

## 2016-03-10 DIAGNOSIS — Z8611 Personal history of tuberculosis: Secondary | ICD-10-CM | POA: Diagnosis not present

## 2016-03-10 DIAGNOSIS — E1122 Type 2 diabetes mellitus with diabetic chronic kidney disease: Secondary | ICD-10-CM | POA: Diagnosis present

## 2016-03-10 DIAGNOSIS — Z9115 Patient's noncompliance with renal dialysis: Secondary | ICD-10-CM | POA: Diagnosis not present

## 2016-03-10 DIAGNOSIS — Z87891 Personal history of nicotine dependence: Secondary | ICD-10-CM | POA: Diagnosis not present

## 2016-03-10 DIAGNOSIS — Z86010 Personal history of colonic polyps: Secondary | ICD-10-CM | POA: Diagnosis not present

## 2016-03-10 DIAGNOSIS — Z7982 Long term (current) use of aspirin: Secondary | ICD-10-CM | POA: Diagnosis not present

## 2016-03-10 DIAGNOSIS — E1129 Type 2 diabetes mellitus with other diabetic kidney complication: Secondary | ICD-10-CM | POA: Diagnosis not present

## 2016-03-10 DIAGNOSIS — R0902 Hypoxemia: Secondary | ICD-10-CM | POA: Diagnosis present

## 2016-03-10 DIAGNOSIS — I132 Hypertensive heart and chronic kidney disease with heart failure and with stage 5 chronic kidney disease, or end stage renal disease: Secondary | ICD-10-CM | POA: Diagnosis not present

## 2016-03-10 LAB — DIFFERENTIAL

## 2016-03-10 LAB — BASIC METABOLIC PANEL
ANION GAP: 11 (ref 5–15)
BUN: 18 mg/dL (ref 6–20)
CO2: 30 mmol/L (ref 22–32)
CREATININE: 6.25 mg/dL — AB (ref 0.61–1.24)
Calcium: 9.6 mg/dL (ref 8.9–10.3)
Chloride: 98 mmol/L — ABNORMAL LOW (ref 101–111)
GFR calc Af Amer: 10 mL/min — ABNORMAL LOW (ref >60)
GFR calc non Af Amer: 9 mL/min — ABNORMAL LOW (ref >60)
GLUCOSE: 151 mg/dL — AB (ref 65–99)
POTASSIUM: 3.6 mmol/L (ref 3.5–5.1)
Sodium: 139 mmol/L (ref 135–145)

## 2016-03-10 LAB — CBC
HCT: 33.4 % — ABNORMAL LOW (ref 39.0–52.0)
HCT: 36.8 % — ABNORMAL LOW (ref 39.0–52.0)
HEMOGLOBIN: 10.5 g/dL — AB (ref 13.0–17.0)
Hemoglobin: 10 g/dL — ABNORMAL LOW (ref 13.0–17.0)
MCH: 23 pg — AB (ref 26.0–34.0)
MCH: 23.6 pg — AB (ref 26.0–34.0)
MCHC: 28.5 g/dL — AB (ref 30.0–36.0)
MCHC: 29.9 g/dL — AB (ref 30.0–36.0)
MCV: 78.8 fL (ref 78.0–100.0)
MCV: 80.7 fL (ref 78.0–100.0)
PLATELETS: 119 10*3/uL — AB (ref 150–400)
PLATELETS: 138 10*3/uL — AB (ref 150–400)
RBC: 4.24 MIL/uL (ref 4.22–5.81)
RBC: 4.56 MIL/uL (ref 4.22–5.81)
RDW: 20.4 % — AB (ref 11.5–15.5)
RDW: 20.5 % — ABNORMAL HIGH (ref 11.5–15.5)
WBC: 6.3 10*3/uL (ref 4.0–10.5)
WBC: 5.9 10*3/uL (ref 4.0–10.5)

## 2016-03-10 LAB — GLUCOSE, CAPILLARY: Glucose-Capillary: 130 mg/dL — ABNORMAL HIGH (ref 65–99)

## 2016-03-10 LAB — I-STAT CHEM 8, ED
BUN: 56 mg/dL — AB (ref 6–20)
CALCIUM ION: 1.01 mmol/L — AB (ref 1.12–1.23)
CHLORIDE: 99 mmol/L — AB (ref 101–111)
Creatinine, Ser: 10.3 mg/dL — ABNORMAL HIGH (ref 0.61–1.24)
GLUCOSE: 129 mg/dL — AB (ref 65–99)
HCT: 31 % — ABNORMAL LOW (ref 39.0–52.0)
Hemoglobin: 10.5 g/dL — ABNORMAL LOW (ref 13.0–17.0)
Potassium: 7.6 mmol/L (ref 3.5–5.1)
SODIUM: 134 mmol/L — AB (ref 135–145)
TCO2: 26 mmol/L (ref 0–100)

## 2016-03-10 LAB — BRAIN NATRIURETIC PEPTIDE: B Natriuretic Peptide: 810.9 pg/mL — ABNORMAL HIGH (ref 0.0–100.0)

## 2016-03-10 MED ORDER — AMLODIPINE BESYLATE 10 MG PO TABS
10.0000 mg | ORAL_TABLET | Freq: Every day | ORAL | Status: DC
  Administered 2016-03-10 – 2016-03-11 (×2): 10 mg via ORAL
  Filled 2016-03-10 (×2): qty 1

## 2016-03-10 MED ORDER — LABETALOL HCL 100 MG PO TABS
100.0000 mg | ORAL_TABLET | Freq: Two times a day (BID) | ORAL | Status: DC
  Administered 2016-03-10: 100 mg via ORAL
  Filled 2016-03-10: qty 1

## 2016-03-10 MED ORDER — CINACALCET HCL 30 MG PO TABS
90.0000 mg | ORAL_TABLET | Freq: Every day | ORAL | Status: DC
  Administered 2016-03-10 – 2016-03-11 (×2): 90 mg via ORAL
  Filled 2016-03-10 (×3): qty 3

## 2016-03-10 MED ORDER — LIDOCAINE-PRILOCAINE 2.5-2.5 % EX CREA: 1.0000 "application " | TOPICAL_CREAM | CUTANEOUS | Status: DC | PRN

## 2016-03-10 MED ORDER — ACETAMINOPHEN 650 MG RE SUPP: 650.0000 mg | Freq: Four times a day (QID) | RECTAL | Status: DC | PRN

## 2016-03-10 MED ORDER — SODIUM CHLORIDE 0.9 % IV SOLN: 100.0000 mL | INTRAVENOUS | Status: DC | PRN

## 2016-03-10 MED ORDER — DEXTROSE 50 % IV SOLN
1.0000 | Freq: Once | INTRAVENOUS | Status: AC
  Administered 2016-03-10: 50 mL via INTRAVENOUS
  Filled 2016-03-10: qty 50

## 2016-03-10 MED ORDER — AMITRIPTYLINE HCL 25 MG PO TABS
100.0000 mg | ORAL_TABLET | Freq: Every day | ORAL | Status: DC
  Administered 2016-03-10 – 2016-03-11 (×2): 100 mg via ORAL
  Filled 2016-03-10: qty 4
  Filled 2016-03-10: qty 1
  Filled 2016-03-10: qty 4

## 2016-03-10 MED ORDER — ONDANSETRON HCL 4 MG PO TABS
4.0000 mg | ORAL_TABLET | Freq: Four times a day (QID) | ORAL | Status: DC | PRN
Start: 2016-03-10 — End: 2016-03-12

## 2016-03-10 MED ORDER — HYDROMORPHONE HCL 1 MG/ML IJ SOLN
0.5000 mg | INTRAMUSCULAR | Status: DC | PRN
  Administered 2016-03-10 – 2016-03-12 (×5): 1 mg via INTRAVENOUS
  Filled 2016-03-10 (×4): qty 1

## 2016-03-10 MED ORDER — INSULIN GLARGINE 100 UNIT/ML ~~LOC~~ SOLN
10.0000 [IU] | Freq: Every day | SUBCUTANEOUS | Status: DC
  Administered 2016-03-10: 10 [IU] via SUBCUTANEOUS
  Administered 2016-03-11: 5 [IU] via SUBCUTANEOUS
  Filled 2016-03-10 (×3): qty 0.1

## 2016-03-10 MED ORDER — LIDOCAINE HCL (PF) 1 % IJ SOLN: 5.0000 mL | INTRAMUSCULAR | Status: DC | PRN

## 2016-03-10 MED ORDER — SEVELAMER CARBONATE 800 MG PO TABS
3200.0000 mg | ORAL_TABLET | Freq: Three times a day (TID) | ORAL | Status: DC
  Administered 2016-03-10 – 2016-03-12 (×8): 3200 mg via ORAL
  Filled 2016-03-10 (×8): qty 4

## 2016-03-10 MED ORDER — PENTAFLUOROPROP-TETRAFLUOROETH EX AERO: 1.0000 "application " | INHALATION_SPRAY | CUTANEOUS | Status: DC | PRN

## 2016-03-10 MED ORDER — INSULIN ASPART 100 UNIT/ML IV SOLN
10.0000 [IU] | Freq: Once | INTRAVENOUS | Status: AC
  Administered 2016-03-10: 10 [IU] via INTRAVENOUS
  Filled 2016-03-10: qty 1

## 2016-03-10 MED ORDER — LOSARTAN POTASSIUM 50 MG PO TABS
100.0000 mg | ORAL_TABLET | Freq: Every day | ORAL | Status: DC
  Administered 2016-03-10 – 2016-03-11 (×2): 100 mg via ORAL
  Filled 2016-03-10 (×2): qty 2

## 2016-03-10 MED ORDER — ACETAMINOPHEN 325 MG PO TABS: 650.0000 mg | ORAL_TABLET | Freq: Four times a day (QID) | ORAL | Status: DC | PRN

## 2016-03-10 MED ORDER — ALTEPLASE 2 MG IJ SOLR: 2.0000 mg | Freq: Once | INTRAMUSCULAR | Status: DC | PRN

## 2016-03-10 MED ORDER — HYDROMORPHONE HCL 1 MG/ML IJ SOLN
INTRAMUSCULAR | Status: AC
  Administered 2016-03-10: 1 mg via INTRAVENOUS
  Filled 2016-03-10: qty 1

## 2016-03-10 MED ORDER — HEPARIN SODIUM (PORCINE) 1000 UNIT/ML DIALYSIS: 40.0000 [IU]/kg | Freq: Once | INTRAMUSCULAR | Status: DC

## 2016-03-10 MED ORDER — DOXERCALCIFEROL 4 MCG/2ML IV SOLN
7.0000 ug | INTRAVENOUS | Status: DC
  Administered 2016-03-10 (×2): 7 ug via INTRAVENOUS
  Filled 2016-03-10: qty 4

## 2016-03-10 MED ORDER — HEPARIN SODIUM (PORCINE) 5000 UNIT/ML IJ SOLN
5000.0000 [IU] | Freq: Three times a day (TID) | INTRAMUSCULAR | Status: DC
  Administered 2016-03-10 – 2016-03-12 (×6): 5000 [IU] via SUBCUTANEOUS
  Filled 2016-03-10 (×6): qty 1

## 2016-03-10 MED ORDER — SODIUM CHLORIDE 0.9 % IV SOLN: 250.0000 mL | INTRAVENOUS | Status: DC | PRN

## 2016-03-10 MED ORDER — DOXERCALCIFEROL 4 MCG/2ML IV SOLN
INTRAVENOUS | Status: AC
  Administered 2016-03-10: 7 ug via INTRAVENOUS
  Filled 2016-03-10: qty 4

## 2016-03-10 MED ORDER — HEPARIN SODIUM (PORCINE) 1000 UNIT/ML DIALYSIS
100.0000 [IU]/kg | INTRAMUSCULAR | Status: DC | PRN
  Filled 2016-03-10: qty 10

## 2016-03-10 MED ORDER — ONDANSETRON HCL 4 MG/2ML IJ SOLN: 4.0000 mg | Freq: Four times a day (QID) | INTRAMUSCULAR | Status: DC | PRN

## 2016-03-10 MED ORDER — SODIUM CHLORIDE 0.9% FLUSH
3.0000 mL | INTRAVENOUS | Status: DC | PRN
Start: 2016-03-10 — End: 2016-03-12

## 2016-03-10 MED ORDER — PREGABALIN 50 MG PO CAPS
50.0000 mg | ORAL_CAPSULE | Freq: Every day | ORAL | Status: DC
  Administered 2016-03-10 – 2016-03-11 (×2): 50 mg via ORAL
  Filled 2016-03-10 (×2): qty 1

## 2016-03-10 MED ORDER — SODIUM CHLORIDE 0.9% FLUSH
3.0000 mL | Freq: Two times a day (BID) | INTRAVENOUS | Status: DC
  Administered 2016-03-10 – 2016-03-12 (×5): 3 mL via INTRAVENOUS

## 2016-03-10 MED ORDER — ATORVASTATIN CALCIUM 20 MG PO TABS
20.0000 mg | ORAL_TABLET | Freq: Every day | ORAL | Status: DC
  Administered 2016-03-10 – 2016-03-11 (×2): 20 mg via ORAL
  Filled 2016-03-10 (×2): qty 1

## 2016-03-10 MED ORDER — CARVEDILOL 12.5 MG PO TABS
25.0000 mg | ORAL_TABLET | Freq: Two times a day (BID) | ORAL | Status: DC
  Administered 2016-03-10: 25 mg via ORAL
  Filled 2016-03-10: qty 2

## 2016-03-10 MED ORDER — HEPARIN SODIUM (PORCINE) 1000 UNIT/ML DIALYSIS
1000.0000 [IU] | INTRAMUSCULAR | Status: DC | PRN
  Filled 2016-03-10: qty 1

## 2016-03-10 MED ORDER — CALCIUM GLUCONATE 10 % IV SOLN
1.0000 g | Freq: Once | INTRAVENOUS | Status: AC
  Administered 2016-03-10: 1 g via INTRAVENOUS
  Filled 2016-03-10: qty 10

## 2016-03-10 MED ORDER — HEPARIN SODIUM (PORCINE) 1000 UNIT/ML DIALYSIS: 1000.0000 [IU] | INTRAMUSCULAR | Status: DC | PRN

## 2016-03-10 MED ORDER — ASPIRIN 81 MG PO CHEW
81.0000 mg | CHEWABLE_TABLET | Freq: Every day | ORAL | Status: DC
  Administered 2016-03-10 – 2016-03-12 (×3): 81 mg via ORAL
  Filled 2016-03-10 (×3): qty 1

## 2016-03-10 MED ORDER — CLOPIDOGREL BISULFATE 75 MG PO TABS
75.0000 mg | ORAL_TABLET | Freq: Every day | ORAL | Status: DC
  Administered 2016-03-10 – 2016-03-12 (×3): 75 mg via ORAL
  Filled 2016-03-10 (×2): qty 1

## 2016-03-10 MED ORDER — LABETALOL HCL 5 MG/ML IV SOLN
5.0000 mg | Freq: Once | INTRAVENOUS | Status: AC
  Administered 2016-03-10: 5 mg via INTRAVENOUS
  Filled 2016-03-10: qty 4

## 2016-03-10 MED ORDER — HYDRALAZINE HCL 25 MG PO TABS
25.0000 mg | ORAL_TABLET | Freq: Four times a day (QID) | ORAL | Status: DC
  Administered 2016-03-10 – 2016-03-11 (×3): 25 mg via ORAL
  Filled 2016-03-10 (×3): qty 1

## 2016-03-10 NOTE — Progress Notes (Signed)
Patient arrived to unit per bed.  Reviewed treatment plan and this RN agrees.  Report received from bedside RN, Crystal.  Consent verified.  Patient A & O X 4. Lung sounds clear to ausculation in all fields. Generalized edema. Cardiac: NSR.  Prepped LUAVF with alcohol and cannulated with two 15 gauge needles.  Pulsation of blood noted.  Flushed access well with saline per protocol.  Connected and secured lines and initiated tx at 1515.  UF goal of 4500 mL and net fluid removal of 4000 mL.  Will continue to monitor.

## 2016-03-10 NOTE — Progress Notes (Signed)
Patient ID: [name] [name], male   DOB: 1956-04-05, 60 y.o.   MRN: [id number]                                                                PROGRESS NOTE                                                                                                                                                                                                             Patient Demographics:    [name] [name], is a 60 y.o. male, DOB - 1956-06-21, [id number]  Admit date - 03/09/2016   Admitting Physician Theressa Millard, MD  Outpatient Primary MD for the patient is [name]  LOS - 0  Outpatient Specialists:   Chief Complaint  Patient presents with  . Shortness of Breath       Brief Narrative   [name] [name] is a 60 y.o. male with a history of ESRD on HD (T, Th, Sat), CAD, HTN, Hyperlipidemia who presents to the ED with complaints of substernal Chest Pain described as Chest Tightness with SOB since the evening. He denies missing any dialysis treatents, and reports that his last Dialysis Rx was Thursday ( at the [facility]). He was found to have Pulmonary edam on Chest X-ray and was placed on BiPAP in the ED, and his labs revealed Hyperkalemia at 7.6. His BUN/Cr was 56/10.30. He was administered IV Dextrose, Insulin, and Calcium in the ED and Nephrology Dr Jimmy Footman was consulted for emergent Dialysis tonight and again in the [place]. He was referred for medical admission.    Subjective:    [name] today has, No headache, +chest pain reproducible with palpation.   No abdominal pain - No Nausea, No new weakness tingling or numbness, No Cough - SOB.   Assessment  & Plan :    Principal Problem:   Hyperkalemia Active Problems:   Chest pain, atypical   Hypertension   CAD -S/P LAD BMS 2011, LAD DES 2012- patent cors Feb 2016   ESRD (end stage renal disease) on dialysis ([place])   Acute on chronic diastolic CHF (congestive heart failure), NYHA class 1 ([place])   Pulmonary  edema   Type II diabetes mellitus ([place])   1.  Hypertension uncontrolled D/c carvedilol Start on Labetalol 100mg   po bid Labetalol 5mg  iv x1.   Start hydralazine   2. Cp atypical Trop negative.   3. Cardiac murmer Check cardiac echo.   4. Dm2 Cont SSI,   5. ESRD on HD (T, T, S) Appreciate nephrology input  6. Hyperkalemia Resolved.  Check cmp in am  7 CAD  Cont plavix, lipitor,   8. Anemia Check cbc in am   Code Status : FULL CODE  Family Communication  : none  Disposition Plan  : home  Barriers For Discharge : none  Consults  :  nephhrology  Procedures  : none  DVT Prophylaxis  :  Heparin - SCDs   Lab Results  Component Value Date   PLT 119* 03/10/2016    Antibiotics  :    Anti-infectives    None        Objective:   Filed Vitals:   03/10/16 0515 03/10/16 0641 03/10/16 0700 03/10/16 0902  BP: 168/103 159/112 170/102   Pulse: 101 122 104   Temp:    97.4 F (36.3 C)  TempSrc:    Axillary  Resp: 16 27 18    SpO2: 96% 98% 94%     Wt Readings from Last 3 Encounters:  03/07/16 93.2 kg (205 lb 7.5 oz)  02/21/16 91.2 kg (201 lb 1 oz)  01/19/16 91.173 kg (201 lb)     Intake/Output Summary (Last 24 hours) at 03/10/16 1223 Last data filed at 03/10/16 0515  Gross per 24 hour  Intake      0 ml  Output   2200 ml  Net  -2200 ml     Physical Exam  Awake Alert, Oriented X 3, No new F.N deficits, Normal affect [place].AT,PERRAL Supple Neck,No JVD, No cervical lymphadenopathy appriciated.  Symmetrical Chest wall movement, Good air movement bilaterally, CTAB RRR,No Gallops,Rubs or new Murmurs, No Parasternal Heave  Cp w palpation of sternum +ve B.Sounds, Abd Soft, No tenderness, No organomegaly appriciated, No rebound - guarding or rigidity. No Cyanosis, Clubbing or edema, No new Rash or bruise     Data Review:    CBC  Recent Labs Lab 03/07/16 1100 03/10/16 0006 03/10/16 0057 03/10/16 0932  WBC 5.7  --  6.3 5.9  HGB 10.4* 10.5* 10.0*  10.5*  HCT 34.8* 31.0* 33.4* 36.8*  PLT 136*  --  138* 119*  MCV 79.6  --  78.8 80.7  MCH 23.8*  --  23.6* 23.0*  MCHC 29.9*  --  29.9* 28.5*  RDW 20.1*  --  20.4* 20.5*  LYMPHSABS  --   --  PENDING  --   MONOABS  --   --  PENDING  --   EOSABS  --   --  PENDING  --   BASOSABS  --   --  PENDING  --     Chemistries   Recent Labs Lab 03/07/16 1100 03/10/16 0006 03/10/16 0932  NA 139 134* 139  K 3.7 7.6* 3.6  CL 100* 99* 98*  CO2 27  --  30  GLUCOSE 107* 129* 151*  BUN 32* 56* 18  CREATININE 7.58* 10.30* 6.25*  CALCIUM 9.6  --  9.6  AST 11*  --   --   ALT 13*  --   --   ALKPHOS 106  --   --   BILITOT 0.8  --   --    ------------------------------------------------------------------------------------------------------------------ No results for input(s): CHOL, HDL, LDLCALC, TRIG, CHOLHDL, LDLDIRECT in the last 72 hours.  Lab Results  Component Value Date  HGBA1C 6.1* 10/29/2015   ------------------------------------------------------------------------------------------------------------------ No results for input(s): TSH, T4TOTAL, T3FREE, THYROIDAB in the last 72 hours.  Invalid input(s): FREET3 ------------------------------------------------------------------------------------------------------------------ No results for input(s): VITAMINB12, FOLATE, FERRITIN, TIBC, IRON, RETICCTPCT in the last 72 hours.  Coagulation profile  Recent Labs Lab 03/07/16 1100  INR 1.37    No results for input(s): DDIMER in the last 72 hours.  Cardiac Enzymes No results for input(s): CKMB, TROPONINI, MYOGLOBIN in the last 168 hours.  Invalid input(s): CK ------------------------------------------------------------------------------------------------------------------    Component Value Date/Time   BNP 810.9* 03/10/2016 0058    Inpatient Medications  Scheduled Meds: . amitriptyline  100 mg Oral QHS  . amLODipine  10 mg Oral QHS  . aspirin  81 mg Oral Daily  .  atorvastatin  20 mg Oral q1800  . cinacalcet  90 mg Oral Q supper  . clopidogrel  75 mg Oral Daily  . doxercalciferol  7 mcg Intravenous Q T,Th,Sa-HD  . heparin  5,000 Units Subcutaneous Q8H  . hydrALAZINE  25 mg Oral Q6H  . insulin glargine  10 Units Subcutaneous QHS  . labetalol  100 mg Oral BID  . labetalol  5 mg Intravenous Once  . losartan  100 mg Oral QHS  . pregabalin  50 mg Oral QHS  . sevelamer carbonate  3,200 mg Oral TID WC  . sodium chloride flush  3 mL Intravenous Q12H   Continuous Infusions:  PRN Meds:.sodium chloride, sodium chloride, sodium chloride, acetaminophen **OR** acetaminophen, alteplase, heparin, heparin, HYDROmorphone (DILAUDID) injection, lidocaine (PF), lidocaine-prilocaine, nitroGLYCERIN, ondansetron **OR** ondansetron (ZOFRAN) IV, pentafluoroprop-tetrafluoroeth, sodium chloride flush  Micro Results No results found for this or any previous visit (from the past 240 hour(s)).  Radiology Reports Dg Chest 2 View  03/07/2016  CLINICAL DATA:  Central chest pain for 2 days EXAM: CHEST  2 VIEW COMPARISON:  02/19/2016 FINDINGS: Cardiac shadow is stable. Interstitial changes are again identified which may represent a degree of interstitial edema. No focal confluent infiltrate is seen. The overall appearance is stable from the prior study. IMPRESSION: No significant interval change from the prior exam. Electronically Signed   By: Inez Catalina M.D.   On: 03/07/2016 12:51   Dg Chest 2 View  02/19/2016  CLINICAL DATA:  Shortness of breath and chest pain for 2 weeks. EXAM: CHEST  2 VIEW COMPARISON:  02/13/2016 FINDINGS: Shallow inspiration with elevation of the left hemidiaphragm. Heart size and pulmonary vascularity are normal. Central interstitial pattern to the lungs with mild bronchial wall thickening may indicate chronic bronchitis. No focal airspace disease or consolidation. No blunting of costophrenic angles. No pneumothorax. Calcification of the aorta. IMPRESSION:  Central interstitial changes in the lungs possibly representing chronic bronchitis or interstitial fibrosis. No evidence of active pulmonary disease. Electronically Signed   By: Lucienne Capers M.D.   On: 02/19/2016 21:39   Dg Chest 2 View  02/13/2016  CLINICAL DATA:  [place] getting out of bed. Losing consciousness. Right chest pain and right shoulder pain. EXAM: CHEST  2 VIEW COMPARISON:  01/19/2016 FINDINGS: Heart size is mildly enlarged but similar to the previous examination. Lungs are clear without airspace disease, pulmonary edema or pneumothorax. Patient is mildly rotated towards the right. No large pleural effusions. Bony thorax appears to be intact. IMPRESSION: No acute cardiopulmonary disease. Electronically Signed   By: Markus Daft M.D.   On: 02/13/2016 17:24   Ct Head Wo Contrast  02/13/2016  CLINICAL DATA:  60 year old male with unwitnessed fall, on heparin. Initial encounter. EXAM: CT  HEAD WITHOUT CONTRAST TECHNIQUE: Contiguous axial images were obtained from the base of the skull through the vertex without intravenous contrast. COMPARISON:  10/30/2015 noncontrast head CT.  Brain MRI 10/28/2015. FINDINGS: Visualized paranasal sinuses and mastoids are clear. Mild right parietal convexity scalp soft tissue scarring again noted. No acute scalp hematoma identified. Visualized orbit soft tissues are within normal limits. Calvarium intact. No acute osseous abnormality identified. Calcified atherosclerosis at the skull base. Cerebral white matter changes re- demonstrated and appears stable from the January MRI. Stable gray-white matter differentiation. No cortically based acute infarct identified. No midline shift, mass effect, or evidence of intracranial mass lesion. No acute intracranial hemorrhage identified. No ventriculomegaly. No suspicious intracranial vascular hyperdensity. IMPRESSION: No acute intracranial abnormality. Stable nonspecific cerebral white matter signal changes since the brain MRI in  January. Electronically Signed   By: Genevie Ann M.D.   On: 02/13/2016 18:18   Dg Chest Portable 1 View  03/09/2016  CLINICAL DATA:  Acute onset of shortness of breath and cough. Initial encounter. EXAM: PORTABLE CHEST 1 VIEW COMPARISON:  Chest radiograph performed 03/07/2016 FINDINGS: There is elevation of the left hemidiaphragm. Vascular congestion is noted. Increased interstitial markings raise concern for pulmonary edema. No definite pleural effusion or pneumothorax is seen. The cardiomediastinal silhouette is mildly enlarged. No acute osseous abnormalities are identified. IMPRESSION: Elevation of the left hemidiaphragm. Vascular congestion and mild cardiomegaly. Increased interstitial markings raise concern for pulmonary edema. Electronically Signed   By: Garald Balding M.D.   On: 03/09/2016 23:38    Time Spent in minutes  30   Jani Gravel M.D on 03/10/2016 at 12:23 PM  Between 7am to 7pm - Pager - [phone]  After 7pm go to [email or web address] - password [facility]  [facility] -  Office  [phone]

## 2016-03-10 NOTE — H&P (Signed)
[facility] Admission History and Physical       [name] [name] [id number] DOB: 04-Dec-1955 DOA: 03/09/2016  Referring physician: EDP PCP: [name]  Specialists:   Chief Complaint: Chest Pain and SOB  HPI: [name] [name] is a 60 y.o. male with a history of ESRD on HD (T, Th, Sat), CAD, HTN, Hyperlipidemia who presents to the ED with complaints of substernal Chest Pain described as Chest Tightness with SOB since the evening.   He denies missing any dialysis treatents, and reports that his last Dialysis Rx was Thursday ( at the [facility]).   He was found to have Pulmonary edam on Chest X-ray and was placed on BiPAP in the ED, and his labs revealed Hyperkalemia at 7.6.  His BUN/Cr was 56/10.30.   He was administered IV Dextrose, Insulin, and Calcium in the ED and Nephrology Dr Jimmy Footman was consulted for emergent Dialysis tonight and again in the [place].   He was referred for medical admission.      Review of Systems:  Constitutional: No Weight Loss, No Weight Gain, Night Sweats, Fevers, Chills, Dizziness, Light Headedness, Fatigue, or Generalized Weakness HEENT: No Headaches, Difficulty Swallowing,Tooth/Dental Problems,Sore Throat,  No Sneezing, Rhinitis, Ear Ache, Nasal Congestion, or Post Nasal Drip,  Cardio-vascular:  +Chest pain, Orthopnea, PND, Edema in Lower Extremities, Anasarca, Dizziness, Palpitations  Resp: +Dyspnea, No DOE, No Productive Cough, No Non-Productive Cough, No Hemoptysis, No Wheezing.    GI: No Heartburn, Indigestion, Abdominal Pain, Nausea, Vomiting, Diarrhea, Constipation, Hematemesis, Hematochezia, Melena, Change in Bowel Habits,  Loss of Appetite  GU: No Dysuria, No Change in Color of Urine, No Urgency or Urinary Frequency, No Flank pain.  Musculoskeletal: No Joint Pain or Swelling, No Decreased Range of Motion, No Back Pain.  Neurologic: No Syncope, No Seizures, Muscle Weakness, Paresthesia, Vision Disturbance or Loss, No Diplopia, No  Vertigo, No Difficulty Walking,  Skin: No Rash or Lesions. Psych: No Change in Mood or Affect, No Depression or Anxiety, No Memory loss, No Confusion, or Hallucinations   Past Medical History  Diagnosis Date  . Hypertension   . Hematochezia     a. 2014: colonscopy, which showed moderately-sized internal hemorrhoids, two 18mm polyps in transverse colon and ascending colon that were resected, five 2-51mm polyps in sigmoid colon, descending colon, transverse colon, and ascending colon that were resected. An upper endoscopy was performed and showed normal esophagus, stomach, and duodenum.  . Hematuria     a. H/o hematuria 2014 with cystoscopy that was unrevealing for his source of hematuria. He underwent a kidney ultrasound on 10/14 that showed mildly echogenic and scarred kidneys compatible with medical renal disease, without hydronephrosis or renal calculi.  [name] Anemia   . CAD (coronary artery disease)     a. per [facility] s/p 3.20mm x 40mm Vision BMS to mid LAD 12/2009 and Xience DES to mid LAD 10/2010.  . Colon polyps   . Chronic diastolic CHF (congestive heart failure) ([place])   . Hyperlipidemia   . Anginal pain ([place])   . Heart murmur   . 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 Type II diabetes mellitus ([place])   . History of blood transfusion     "had colonoscopy done; they had to give me some blood"  . Daily headache   . ESRD on dialysis [facility]) since ~ 2008    "[place]; TTS" (07/21/2015)  . On home oxygen therapy     "  2L prn" (07/21/2015)  . Renal insufficiency      Past Surgical History  Procedure Laterality Date  . Left heart catheterization with coronary angiogram N/A 11/23/2014    Procedure: LEFT HEART CATHETERIZATION WITH CORONARY ANGIOGRAM;  Surgeon: Troy Sine, MD;  Location: [facility] CATH LAB;  Service: Cardiovascular;  Laterality: N/A;  . Lithotripsy  X1  . Cystoscopy w/ stone manipulation  X2?  . Cardiac catheterization  "several"  . Coronary  angioplasty with stent placement  "several"  . Eye surgery Bilateral     "laser OR for hemorrhage"  . Av fistula placement Left ~ 2007    "upper arm"      Prior to Admission medications   Medication Sig Start Date End Date Taking? Authorizing Provider  albuterol (PROVENTIL HFA;VENTOLIN HFA) 108 (90 Base) MCG/ACT inhaler Inhale 1-2 puffs into the lungs every 6 (six) hours as needed for wheezing or shortness of breath. 01/19/16  Yes Delsa Grana, PA-C  amitriptyline (ELAVIL) 100 MG tablet Take 1 tablet (100 mg total) by mouth at bedtime. 07/27/15  Yes Shela Leff, MD  amLODipine (NORVASC) 10 MG tablet Take 1 tablet (10 mg total) by mouth daily. 10/30/15  Yes Ripudeep Krystal Eaton, MD  aspirin 81 MG chewable tablet Chew 1 tablet (81 mg total) by mouth daily. 07/22/15  Yes Iline Oven, MD  atorvastatin (LIPITOR) 20 MG tablet Take 20 mg by mouth at bedtime.    Yes Historical Provider, MD  carvedilol (COREG) 25 MG tablet Take 2 tablets (50 mg total) by mouth 2 (two) times daily. 10/30/15  Yes Ripudeep Krystal Eaton, MD  cinacalcet (SENSIPAR) 90 MG tablet Take 90 mg by mouth at bedtime.    Yes Historical Provider, MD  clopidogrel (PLAVIX) 75 MG tablet Take 1 tablet (75 mg total) by mouth daily. 11/24/14  Yes Juluis Mire, MD  Darbepoetin Alfa (ARANESP) 25 MCG/0.42ML SOSY injection Inject 0.42 mLs (25 mcg total) into the vein every Tuesday with hemodialysis. 12/15/14  Yes Geradine Girt, DO  doxercalciferol (HECTOROL) 4 MCG/2ML injection Inject 1 mL (2 mcg total) into the vein Every Tuesday,Thursday,and Saturday with dialysis. 12/15/14  Yes Geradine Girt, DO  ferric gluconate 62.5 mg in sodium chloride 0.9 % 100 mL Inject 62.5 mg into the vein every Thursday with hemodialysis. 12/16/14  Yes Geradine Girt, DO  losartan (COZAAR) 100 MG tablet Take 100 mg by mouth daily.   Yes Historical Provider, MD  multivitamin (RENA-VIT) TABS tablet Take 1 tablet by mouth at bedtime. 12/15/14  Yes Geradine Girt, DO    nitroGLYCERIN (NITROSTAT) 0.4 MG SL tablet Place 1 tablet (0.4 mg total) under the tongue every 5 (five) minutes as needed for chest pain. 06/05/15  Yes Janece Canterbury, MD  pantoprazole (PROTONIX) 40 MG tablet Take 1 tablet (40 mg total) by mouth daily. 06/05/15  Yes Janece Canterbury, MD  pregabalin (LYRICA) 50 MG capsule Take 1 capsule (50 mg total) by mouth daily. 12/15/14  Yes Geradine Girt, DO  sevelamer carbonate (RENVELA) 800 MG tablet Take 2,400 mg by mouth 3 (three) times daily with meals.    Yes Historical Provider, MD  simethicone (GAS-X) 80 MG chewable tablet Chew 1 tablet (80 mg total) by mouth every 6 (six) hours as needed for flatulence. 08/29/15  Yes Collier Salina, MD  sucroferric oxyhydroxide (VELPHORO) 500 MG chewable tablet Chew 1,000 mg by mouth 3 (three) times daily with meals.   Yes Historical Provider, MD  venlafaxine XR (EFFEXOR XR) 75  MG 24 hr capsule Take 1 capsule (75 mg total) by mouth daily with breakfast. 07/27/15  Yes Shela Leff, MD  isosorbide mononitrate (IMDUR) 30 MG 24 hr tablet Take 1 tablet (30 mg total) by mouth daily. Patient not taking: Reported on 01/19/2016 10/30/15   Ripudeep Krystal Eaton, MD     Allergies  Allergen Reactions  . Enalapril Hives    Social History:  reports that he quit smoking about 5 years ago. His smoking use included Cigarettes. He has a 4 pack-year smoking history. He has never used smokeless tobacco. He reports that he does not drink alcohol or use illicit drugs.    Family History  Problem Relation Age of Onset  . Hypertension    . Bone cancer Mother   . Anuerysm Father   . Diabetes type II Daughter        Physical Exam:  GEN:  Pleasant Elderly Appearing  60 y.o. African American male examined and in no acute distress; cooperative with exam Filed Vitals:   03/09/16 2311 03/09/16 2330 03/10/16 0002  BP:  168/102 167/92  Pulse:  106 99  Resp:  25 23  SpO2: 99% 94% 95%   Blood pressure 167/92, pulse 99, resp. rate 23,  SpO2 95 %. PSYCH: He is alert and oriented x4; does not appear anxious does not appear depressed; affect is normal HEENT: Normocephalic and Atraumatic, Mucous membranes pink; PERRLA; EOM intact; Fundi:  Benign;  No scleral icterus, Nares: Patent, Oropharynx: Clear,    Neck:  FROM, No Cervical Lymphadenopathy nor Thyromegaly or Carotid Bruit; No JVD; Breasts:: Not examined CHEST WALL: No tenderness CHEST: Normal respiration, clear to auscultation bilaterally HEART: Regular rate and rhythm; no murmurs rubs or gallops BACK: No kyphosis or scoliosis; No CVA tenderness ABDOMEN: Positive Bowel Sounds, Soft Non-Tender, No Rebound or Guarding; No Masses, No Organomegaly. Rectal Exam: Not done EXTREMITIES: No Cyanosis, Clubbing, or Edema; No Ulcerations. Genitalia: not examined PULSES: 2+ and symmetric SKIN: Normal hydration no rash or ulceration CNS:  Alert and Oriented x 4, No Focal Deficits Vascular: pulses palpable throughout    Labs on Admission:  Basic Metabolic Panel:  Recent Labs Lab 03/07/16 1100 03/10/16 0006  NA 139 134*  K 3.7 7.6*  CL 100* 99*  CO2 27  --   GLUCOSE 107* 129*  BUN 32* 56*  CREATININE 7.58* 10.30*  CALCIUM 9.6  --    Liver Function Tests:  Recent Labs Lab 03/07/16 1100  AST 11*  ALT 13*  ALKPHOS 106  BILITOT 0.8  PROT 7.6  ALBUMIN 3.7   No results for input(s): LIPASE, AMYLASE in the last 168 hours. No results for input(s): AMMONIA in the last 168 hours. CBC:  Recent Labs Lab 03/07/16 1100 03/10/16 0006  WBC 5.7  --   HGB 10.4* 10.5*  HCT 34.8* 31.0*  MCV 79.6  --   PLT 136*  --    Cardiac Enzymes: No results for input(s): CKTOTAL, CKMB, CKMBINDEX, TROPONINI in the last 168 hours.  BNP (last 3 results)  Recent Labs  01/01/16 1556 01/19/16 1334 02/19/16 2050  BNP 2463.4* 866.3* 1970.6*    ProBNP (last 3 results) No results for input(s): PROBNP in the last 8760 hours.  CBG: No results for input(s): GLUCAP in the last 168  hours.  Radiological Exams on Admission: Dg Chest Portable 1 View  03/09/2016  CLINICAL DATA:  Acute onset of shortness of breath and cough. Initial encounter. EXAM: PORTABLE CHEST 1 VIEW COMPARISON:  Chest radiograph  performed 03/07/2016 FINDINGS: There is elevation of the left hemidiaphragm. Vascular congestion is noted. Increased interstitial markings raise concern for pulmonary edema. No definite pleural effusion or pneumothorax is seen. The cardiomediastinal silhouette is mildly enlarged. No acute osseous abnormalities are identified. IMPRESSION: Elevation of the left hemidiaphragm. Vascular congestion and mild cardiomegaly. Increased interstitial markings raise concern for pulmonary edema. Electronically Signed   By: Garald Balding M.D.   On: 03/09/2016 23:38     EKG: Independently reviewed. Sinus tachycardia rate = 106    Assessment/Plan:     60 y.o. male with  Principal Problem:    Hyperkalemia   Dialysis Rx to Correct   Cardiac Monitoring  Active Problems:    Pulmonary edema/Acute on chronic diastolic CHF (congestive heart failure), NYHA class 1 (HCC)   BiPAP   Dialysis to Correct      Chest pain, atypical   Cardiac Monitoring   Cyclet Troponins   Nitropaste, O2, ASA      CAD -S/P LAD BMS 2011, LAD DES 2012- patent cors Feb 2016   Continue Plavix, ASA, Carvedilol, Atorvastatin and Losartan Rx   Cardiac Monitoring     ESRD (end stage renal disease) on dialysis [facility])   Nephrology Consulted and Dialyzing     Hypertension   Continue Carvedilol, Amlodipine, Losartan,       Type II diabetes mellitus (HCC)   Not on Meds    SSI Coverage PRN   Check HbA1C       DVT Prophylaxis   SQ Heparin    Code Status:     FULL CODE       Family Communication:  No Family Present    Disposition Plan:   Observation Status        Time spent:  55 Minutes      Francys Bolin C [facility] Pager [phone]   If 7AM -7PM Please Contact the Day Rounding Team MD for  [facility]  If 7PM-7AM, Please Contact Night-Floor Coverage  [email or web address] Password [email or web address] 03/10/2016, 12:38 AM     ADDENDUM:   Patient was seen and examined on 03/10/2016

## 2016-03-10 NOTE — Consult Note (Signed)
Reason for Consult:CHF, ^ K Referring Physician: Dr Aleatha Borer [name] is an 60 y.o. male.  HPI: 70yr male with ESRD from DM and HTN  Who only dialyzed half of his HD tx yest and completed 2 of last 4 tx, presents with SOB since 3 pm .  Now with progressive dyspnea at rest, and weakness.  No CP or cough or fevers.  Had V x 1 this pm . BS 111 this eve at home.  Other prob include Type 2 DM, CAD, hx TB, anemia, HPTH.  Recurrent issues with nonadherence.  Similar episode with admit about 2 wk ago Constitutional:  as above Eyes: negative Ears, nose, mouth, throat, and face: negative Respiratory: SOB Cardiovascular: as above Gastrointestinal: V last eve Genitourinary:negative Integument/breast: negative Hematologic/lymphatic: anemia Musculoskeletal:negative Neurological: negative Endocrine: DM Allergic/Immunologic: Enalapril   Dialyzes at [facility] on TTS . Primary Nephrologist [place]. EDW 90kg. HD [place] 2/2, Dialyzer 18, Heparin  strd. Access LUA AVF.  Past Medical History  Diagnosis Date  . Hypertension   . Hematochezia     a. 2014: colonscopy, which showed moderately-sized internal hemorrhoids, two 58mm polyps in transverse colon and ascending colon that were resected, five 2-3mm polyps in sigmoid colon, descending colon, transverse colon, and ascending colon that were resected. An upper endoscopy was performed and showed normal esophagus, stomach, and duodenum.  . Hematuria     a. H/o hematuria 2014 with cystoscopy that was unrevealing for his source of hematuria. He underwent a kidney ultrasound on 10/14 that showed mildly echogenic and scarred kidneys compatible with medical renal disease, without hydronephrosis or renal calculi.  [name] Anemia   . CAD (coronary artery disease)     a. per [facility] s/p 3.40mm x 75mm Vision BMS to mid LAD 12/2009 and Xience DES to mid LAD 10/2010.  . Colon polyps   . Chronic diastolic CHF (congestive heart failure) ([place])   . Hyperlipidemia   .  Anginal pain ([place])   . Heart murmur   . 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 Type II diabetes mellitus ([place])   . History of blood transfusion     "had colonoscopy done; they had to give me some blood"  . Daily headache   . ESRD on dialysis [facility]) since ~ 2008    "[place]; TTS" (07/21/2015)  . On home oxygen therapy     "2L prn" (07/21/2015)  . Renal insufficiency     Past Surgical History  Procedure Laterality Date  . Left heart catheterization with coronary angiogram N/A 11/23/2014    Procedure: LEFT HEART CATHETERIZATION WITH CORONARY ANGIOGRAM;  Surgeon: Troy Sine, MD;  Location: [facility] CATH LAB;  Service: Cardiovascular;  Laterality: N/A;  . Lithotripsy  X1  . Cystoscopy w/ stone manipulation  X2?  . Cardiac catheterization  "several"  . Coronary angioplasty with stent placement  "several"  . Eye surgery Bilateral     "laser OR for hemorrhage"  . Av fistula placement Left ~ 2007    "upper arm"    Family History  Problem Relation Age of Onset  . Hypertension    . Bone cancer Mother   . Anuerysm Father   . Diabetes type II Daughter     Social History:  reports that he quit smoking about 5 years ago. His smoking use included Cigarettes. He has a 4 pack-year smoking history. He has never used smokeless tobacco. He reports that he does not drink  alcohol or use illicit drugs.  Allergies:  Allergies  Allergen Reactions  . Enalapril Hives    Medications: I have reviewed the patient's current medications. Prior to Admission:  (Not in a hospital admission) , Hectorol 56mcg, . Micera 237mcg iv q 2 wk, . , Venofer 100mg  iv q 2 wk  Results for orders placed or performed during the hospital encounter of 03/09/16 (from the past 48 hour(s))  I-stat chem 8, ed     Status: Abnormal   Collection Time: 03/10/16 12:06 AM  Result Value Ref Range   Sodium 134 (L) 135 - 145 mmol/L   Potassium 7.6 (HH) 3.5 - 5.1 mmol/L   Chloride 99 (L) 101 - 111  mmol/L   BUN 56 (H) 6 - 20 mg/dL   Creatinine, Ser 10.30 (H) 0.61 - 1.24 mg/dL   Glucose, Bld 129 (H) 65 - 99 mg/dL   Calcium, Ion 1.01 (L) 1.12 - 1.23 mmol/L   TCO2 26 0 - 100 mmol/L   Hemoglobin 10.5 (L) 13.0 - 17.0 g/dL   HCT 31.0 (L) 39.0 - 52.0 %   Comment NOTIFIED PHYSICIAN     Dg Chest Portable 1 View  03/09/2016  CLINICAL DATA:  Acute onset of shortness of breath and cough. Initial encounter. EXAM: PORTABLE CHEST 1 VIEW COMPARISON:  Chest radiograph performed 03/07/2016 FINDINGS: There is elevation of the left hemidiaphragm. Vascular congestion is noted. Increased interstitial markings raise concern for pulmonary edema. No definite pleural effusion or pneumothorax is seen. The cardiomediastinal silhouette is mildly enlarged. No acute osseous abnormalities are identified. IMPRESSION: Elevation of the left hemidiaphragm. Vascular congestion and mild cardiomegaly. Increased interstitial markings raise concern for pulmonary edema. Electronically Signed   By: Garald Balding M.D.   On: 03/09/2016 23:38    ROS Blood pressure 167/92, pulse 104, resp. rate 20, SpO2 100 %. Physical Exam Physical Examination: General appearance - on BIPAP, comfortable Mental status - alert, oriented to person, place, and time Eyes - pupils equal and reactive, extraocular eye movements intact, funduscopic exam abnormal hypertensive retinopathy Mouth - mucous membranes moist, pharynx normal without lesions Neck - adenopathy noted PCL Lymphatics - posterior cervical nodes Chest - rales noted bilat Heart - S1 and S2 normal, S4 present, systolic murmur holosys [id number] at apex Abdomen - pos bs, liver down 5 cm soft,  Musculoskeletal - no joint tenderness, deformity or swelling Extremities - AVF LUA, pos B&T, 1 + edema Skin - normal coloration and turgor, no rashes, no suspicious skin lesions noted  Assessment/Plan: 1 Hyperkalemia, ^ K nonadherence with diet/treatment. Needs emergent HT 2 ESRD: as above recurrent  issues 3 Hypertension: ^with xs vol.  On meds 4. Anemia of ESRD: check 5. Metabolic Bone Disease:  On Vit D, Sensipar 6 NONADHERENCE primary issue 7 DM needs control 8 CAD stress with xs vol P HD, repeat later today., lower K/vol  [name] [name] L 03/10/2016, 1:08 AM

## 2016-03-10 NOTE — Progress Notes (Signed)
Patient transported to Hemodialysis on BiPAP with no complications.

## 2016-03-10 NOTE — Procedures (Signed)
I was present at this session.  I have reviewed the session itself and made appropriate changes.Bp^, lower vol.  LUA AVF  Maggi Hershkowitz L 6/3/20171:59 AM

## 2016-03-10 NOTE — Progress Notes (Signed)
Dialysis treatment completed.  2000 mL ultrafiltrated and net fluid removal 1500 mL.    Patient status unchanged. Lung sounds clear to ausculation in all fields. generalized edema. Cardiac: NSR to ST.  Disconnected lines and removed needles.  Pressure held for 10 minutes and band aid/gauze dressing applied.  Report given to bedside RN, Crystal.  Patient signed off or treatment with 45 minutes left to completion.  Physician notified.

## 2016-03-11 ENCOUNTER — Inpatient Hospital Stay (HOSPITAL_COMMUNITY): Payer: Medicare Other

## 2016-03-11 DIAGNOSIS — R079 Chest pain, unspecified: Secondary | ICD-10-CM

## 2016-03-11 LAB — COMPREHENSIVE METABOLIC PANEL
ALT: 10 U/L — AB (ref 17–63)
AST: 9 U/L — AB (ref 15–41)
Albumin: 3.1 g/dL — ABNORMAL LOW (ref 3.5–5.0)
Alkaline Phosphatase: 78 U/L (ref 38–126)
Anion gap: 9 (ref 5–15)
BILIRUBIN TOTAL: 0.9 mg/dL (ref 0.3–1.2)
BUN: 9 mg/dL (ref 6–20)
CALCIUM: 9.3 mg/dL (ref 8.9–10.3)
CO2: 32 mmol/L (ref 22–32)
CREATININE: 4.89 mg/dL — AB (ref 0.61–1.24)
Chloride: 96 mmol/L — ABNORMAL LOW (ref 101–111)
GFR calc Af Amer: 14 mL/min — ABNORMAL LOW (ref >60)
GFR, EST NON AFRICAN AMERICAN: 12 mL/min — AB (ref >60)
Glucose, Bld: 125 mg/dL — ABNORMAL HIGH (ref 65–99)
Potassium: 4.5 mmol/L (ref 3.5–5.1)
Sodium: 137 mmol/L (ref 135–145)
TOTAL PROTEIN: 6.8 g/dL (ref 6.5–8.1)

## 2016-03-11 LAB — CBC
HEMATOCRIT: 35.5 % — AB (ref 39.0–52.0)
Hemoglobin: 10.4 g/dL — ABNORMAL LOW (ref 13.0–17.0)
MCH: 23.6 pg — ABNORMAL LOW (ref 26.0–34.0)
MCHC: 29.3 g/dL — ABNORMAL LOW (ref 30.0–36.0)
MCV: 80.7 fL (ref 78.0–100.0)
Platelets: 125 10*3/uL — ABNORMAL LOW (ref 150–400)
RBC: 4.4 MIL/uL (ref 4.22–5.81)
RDW: 20.8 % — AB (ref 11.5–15.5)
WBC: 6.4 10*3/uL (ref 4.0–10.5)

## 2016-03-11 LAB — GLUCOSE, CAPILLARY: Glucose-Capillary: 130 mg/dL — ABNORMAL HIGH (ref 65–99)

## 2016-03-11 MED ORDER — RENA-VITE PO TABS
1.0000 | ORAL_TABLET | Freq: Every day | ORAL | Status: DC
  Administered 2016-03-11: 1 via ORAL
  Filled 2016-03-11: qty 1

## 2016-03-11 MED ORDER — TECHNETIUM TO 99M ALBUMIN AGGREGATED
4.3500 | Freq: Once | INTRAVENOUS | Status: AC | PRN
  Administered 2016-03-11: 4 via INTRAVENOUS

## 2016-03-11 MED ORDER — CARVEDILOL 25 MG PO TABS
25.0000 mg | ORAL_TABLET | Freq: Two times a day (BID) | ORAL | Status: DC
  Administered 2016-03-11 – 2016-03-12 (×2): 25 mg via ORAL
  Filled 2016-03-11 (×2): qty 1

## 2016-03-11 MED ORDER — PANTOPRAZOLE SODIUM 40 MG PO TBEC
40.0000 mg | DELAYED_RELEASE_TABLET | Freq: Every day | ORAL | Status: DC
  Administered 2016-03-11 – 2016-03-12 (×2): 40 mg via ORAL
  Filled 2016-03-11 (×2): qty 1

## 2016-03-11 MED ORDER — TECHNETIUM TC 99M DIETHYLENETRIAME-PENTAACETIC ACID: 32.3000 | Freq: Once | INTRAVENOUS | Status: DC | PRN

## 2016-03-11 MED ORDER — CARVEDILOL 12.5 MG PO TABS
12.5000 mg | ORAL_TABLET | Freq: Two times a day (BID) | ORAL | Status: DC
  Administered 2016-03-11: 12.5 mg via ORAL
  Filled 2016-03-11: qty 1

## 2016-03-11 MED ORDER — CARVEDILOL 12.5 MG PO TABS
12.5000 mg | ORAL_TABLET | Freq: Once | ORAL | Status: AC
  Administered 2016-03-11: 12.5 mg via ORAL
  Filled 2016-03-11: qty 1

## 2016-03-11 NOTE — Progress Notes (Signed)
Subjective: Interval History: has complaints sore in chest wall on R.  Objective: Vital signs in last 24 hours: Temp:  [97.3 F (36.3 C)-98.6 F (37 C)] 97.3 F (36.3 C) (06/04 0738) Pulse Rate:  [91-109] 100 (06/04 0700) Resp:  [12-35] 22 (06/04 0700) BP: (147-186)/(87-114) 149/92 mmHg (06/04 0400) SpO2:  [88 %-98 %] 98 % (06/04 0700) Weight:  [82.1 kg (181 lb)-88.905 kg (196 lb)] 88.905 kg (196 lb) (06/04 0600) Weight change:   Intake/Output from previous day: 06/03 0701 - 06/04 0700 In: -  Out: 1800 [Urine:300] Intake/Output this shift:    General appearance: cooperative, no distress and mildly obese Resp: diminished breath sounds bilaterally Cardio: S1, S2 normal and systolic murmur: holosystolic 3/6, blowing at apex GI: pos bs, liver down 6 cm, soft Extremities: AVF LUA B&T  Tender over R ribs  Lab Results:  Recent Labs  03/10/16 0932 03/11/16 0325  WBC 5.9 6.4  HGB 10.5* 10.4*  HCT 36.8* 35.5*  PLT 119* 125*   BMET:  Recent Labs  03/10/16 0932 03/11/16 0325  NA 139 137  K 3.6 4.5  CL 98* 96*  CO2 30 32  GLUCOSE 151* 125*  BUN 18 9  CREATININE 6.25* 4.89*  CALCIUM 9.6 9.3   No results for input(s): PTH in the last 72 hours. Iron Studies: No results for input(s): IRON, TIBC, TRANSFERRIN, FERRITIN in the last 72 hours.  Studies/Results: Dg Chest Portable 1 View  03/09/2016  CLINICAL DATA:  Acute onset of shortness of breath and cough. Initial encounter. EXAM: PORTABLE CHEST 1 VIEW COMPARISON:  Chest radiograph performed 03/07/2016 FINDINGS: There is elevation of the left hemidiaphragm. Vascular congestion is noted. Increased interstitial markings raise concern for pulmonary edema. No definite pleural effusion or pneumothorax is seen. The cardiomediastinal silhouette is mildly enlarged. No acute osseous abnormalities are identified. IMPRESSION: Elevation of the left hemidiaphragm. Vascular congestion and mild cardiomegaly. Increased interstitial markings  raise concern for pulmonary edema. Electronically Signed   By: Garald Balding M.D.   On: 03/09/2016 23:38    I have reviewed the patient's current medications.  Assessment/Plan: 1 ESRD s/o yest again 2 HTNonly control issue is not taking meds, and not dialyzing.   3 NONADHERENCE primary issue causing his prob 4 Anemia esa outpatient 5 HPTH P use outpatient meds, counsel, can d/c from our standpoint.  Not optimistic he will stay out of hospital and suspect will have progressive downhill course.    LOS: 1 day   Joanna Hall L 03/11/2016,9:04 AM

## 2016-03-11 NOTE — Progress Notes (Addendum)
Patient ID: [name] [name], male   DOB: May 23, 1956, 60 y.o.   MRN: [id number]                                                                PROGRESS NOTE                                                                                                                                                                                                             Patient Demographics:    [name] [name], is a 60 y.o. male, DOB - [birth year], [id number]  Admit date - 03/09/2016   Admitting Physician Theressa Millard, MD  Outpatient Primary MD for the patient is [name]  LOS - 3  Outpatient Specialists: nephrology  Chief Complaint  Patient presents with  . Shortness of Breath       Brief Narrative   [name] [name] is a 60 y.o. male with a history of ESRD on HD (T, Th, Sat), CAD, HTN, Hyperlipidemia who presents to the ED with complaints of substernal Chest Pain described as Chest Tightness with SOB since the evening. He denies missing any dialysis treatents, and reports that his last Dialysis Rx was Thursday ( at the [facility]). He was found to have Pulmonary edam on Chest X-ray and was placed on BiPAP in the ED, and his labs revealed Hyperkalemia at 7.6. His BUN/Cr was 56/10.30. He was administered IV Dextrose, Insulin, and Calcium in the ED and Nephrology Dr Jimmy Footman was consulted for emergent Dialysis tonight and again in the [place]. He was referred for medical admission.    Subjective:    [name] [name] today has slight complaint of intermittent R sided cp, lasting a few minutes at a time. Nothing appears to make it better or worse.  Pt states doing well otherwise.  Denies palp, sob   Assessment  & Plan :    Principal Problem:   Hyperkalemia Active Problems:   Chest pain, atypical   Hypertension   CAD -S/P LAD BMS 2011, LAD DES 2012- patent cors Feb 2016   ESRD (end stage renal disease) on dialysis ([place])   Acute on chronic diastolic CHF (congestive heart  failure), NYHA class 1 ([place])   Pulmonary edema   Type II diabetes mellitus ([place])   1. Hypertension uncontrolled  Off labetalol, per nephrology Increase carvedilol to 25mg  po bid Cont hydralazine   2. Cp atypical but due to history of underlying heart disease Will order stress testing  3. Cardiac murmer Check cardiac echo.   4. Dm2 Cont SSI,   5. ESRD on HD (T, T, S) Appreciate nephrology input  6. Hyperkalemia Resolved.  Check cmp in am  7 CAD  Cont plavix, lipitor,   8. Anemia Check cbc in am   Code Status : FULL CODE  Family Communication : none  Disposition Plan : home, anticipate discharge tomorrow if nuclear stress test negative  Barriers For Discharge : none  Consults : nephhrology  Procedures : none  DVT Prophylaxis : Heparin - SCDs      Lab Results  Component Value Date   PLT 125* 03/11/2016    Antibiotics  :    Anti-infectives    None        Objective:   Filed Vitals:   03/11/16 0800 03/11/16 0900 03/11/16 1000 03/11/16 1246  BP: 159/95     Pulse: 96 95 95   Temp:    97.2 F (36.2 C)  TempSrc:    Oral  Resp: 23 22 18    Weight:      SpO2: 97% 95% 100%     Wt Readings from Last 3 Encounters:  03/11/16 88.905 kg (196 lb)  03/07/16 93.2 kg (205 lb 7.5 oz)  02/21/16 91.2 kg (201 lb 1 oz)     Intake/Output Summary (Last 24 hours) at 03/11/16 1308 Last data filed at 03/10/16 1933  Gross per 24 hour  Intake      0 ml  Output   1800 ml  Net  -1800 ml     Physical Exam  Awake Alert, Oriented X 3, No new F.N deficits, Normal affect [place].AT,PERRAL Supple Neck,No JVD, No cervical lymphadenopathy appriciated.  Symmetrical Chest wall movement, Good air movement bilaterally, CTAB RRR,No Gallops,Rubs or new Murmurs, No Parasternal Heave +ve B.Sounds, Abd Soft, No tenderness, No organomegaly appriciated, No rebound - guarding or rigidity. No Cyanosis, Clubbing or edema, No new Rash or bruise       Data Review:     CBC  Recent Labs Lab 03/07/16 1100 03/10/16 0006 03/10/16 0057 03/10/16 0932 03/11/16 0325  WBC 5.7  --  6.3 5.9 6.4  HGB 10.4* 10.5* 10.0* 10.5* 10.4*  HCT 34.8* 31.0* 33.4* 36.8* 35.5*  PLT 136*  --  138* 119* 125*  MCV 79.6  --  78.8 80.7 80.7  MCH 23.8*  --  23.6* 23.0* 23.6*  MCHC 29.9*  --  29.9* 28.5* 29.3*  RDW 20.1*  --  20.4* 20.5* 20.8*  LYMPHSABS  --   --  PENDING  --   --   MONOABS  --   --  PENDING  --   --   EOSABS  --   --  PENDING  --   --   BASOSABS  --   --  PENDING  --   --     Chemistries   Recent Labs Lab 03/07/16 1100 03/10/16 0006 03/10/16 0932 03/11/16 0325  NA 139 134* 139 137  K 3.7 7.6* 3.6 4.5  CL 100* 99* 98* 96*  CO2 27  --  30 32  GLUCOSE 107* 129* 151* 125*  BUN 32* 56* 18 9  CREATININE 7.58* 10.30* 6.25* 4.89*  CALCIUM 9.6  --  9.6 9.3  AST 11*  --   --  9*  ALT  13*  --   --  10*  ALKPHOS 106  --   --  78  BILITOT 0.8  --   --  0.9   ------------------------------------------------------------------------------------------------------------------ No results for input(s): CHOL, HDL, LDLCALC, TRIG, CHOLHDL, LDLDIRECT in the last 72 hours.  Lab Results  Component Value Date   HGBA1C 6.1* 10/29/2015   ------------------------------------------------------------------------------------------------------------------ No results for input(s): TSH, T4TOTAL, T3FREE, THYROIDAB in the last 72 hours.  Invalid input(s): FREET3 ------------------------------------------------------------------------------------------------------------------ No results for input(s): VITAMINB12, FOLATE, FERRITIN, TIBC, IRON, RETICCTPCT in the last 72 hours.  Coagulation profile  Recent Labs Lab 03/07/16 1100  INR 1.37    No results for input(s): DDIMER in the last 72 hours.  Cardiac Enzymes No results for input(s): CKMB, TROPONINI, MYOGLOBIN in the last 168 hours.  Invalid input(s):  CK ------------------------------------------------------------------------------------------------------------------    Component Value Date/Time   BNP 810.9* 03/10/2016 0058    Inpatient Medications  Scheduled Meds: . amitriptyline  100 mg Oral QHS  . amLODipine  10 mg Oral QHS  . aspirin  81 mg Oral Daily  . atorvastatin  20 mg Oral q1800  . carvedilol  12.5 mg Oral BID WC  . cinacalcet  90 mg Oral Q supper  . clopidogrel  75 mg Oral Daily  . heparin  5,000 Units Subcutaneous Q8H  . insulin glargine  10 Units Subcutaneous QHS  . losartan  100 mg Oral QHS  . multivitamin  1 tablet Oral QHS  . pantoprazole  40 mg Oral Daily  . pregabalin  50 mg Oral QHS  . sevelamer carbonate  3,200 mg Oral TID WC  . sodium chloride flush  3 mL Intravenous Q12H   Continuous Infusions:  PRN Meds:.sodium chloride, acetaminophen **OR** acetaminophen, HYDROmorphone (DILAUDID) injection, nitroGLYCERIN, ondansetron **OR** ondansetron (ZOFRAN) IV, sodium chloride flush  Micro Results No results found for this or any previous visit (from the past 240 hour(s)).  Radiology Reports Dg Chest 2 View  03/07/2016  CLINICAL DATA:  Central chest pain for 2 days EXAM: CHEST  2 VIEW COMPARISON:  02/19/2016 FINDINGS: Cardiac shadow is stable. Interstitial changes are again identified which may represent a degree of interstitial edema. No focal confluent infiltrate is seen. The overall appearance is stable from the prior study. IMPRESSION: No significant interval change from the prior exam. Electronically Signed   By: Inez Catalina M.D.   On: 03/07/2016 12:51   Dg Chest 2 View  02/19/2016  CLINICAL DATA:  Shortness of breath and chest pain for 2 weeks. EXAM: CHEST  2 VIEW COMPARISON:  02/13/2016 FINDINGS: Shallow inspiration with elevation of the left hemidiaphragm. Heart size and pulmonary vascularity are normal. Central interstitial pattern to the lungs with mild bronchial wall thickening may indicate chronic  bronchitis. No focal airspace disease or consolidation. No blunting of costophrenic angles. No pneumothorax. Calcification of the aorta. IMPRESSION: Central interstitial changes in the lungs possibly representing chronic bronchitis or interstitial fibrosis. No evidence of active pulmonary disease. Electronically Signed   By: Lucienne Capers M.D.   On: 02/19/2016 21:39   Dg Chest 2 View  02/13/2016  CLINICAL DATA:  [place] getting out of bed. Losing consciousness. Right chest pain and right shoulder pain. EXAM: CHEST  2 VIEW COMPARISON:  01/19/2016 FINDINGS: Heart size is mildly enlarged but similar to the previous examination. Lungs are clear without airspace disease, pulmonary edema or pneumothorax. Patient is mildly rotated towards the right. No large pleural effusions. Bony thorax appears to be intact. IMPRESSION: No acute cardiopulmonary disease. Electronically  Signed   By: Markus Daft M.D.   On: 02/13/2016 17:24   Ct Head Wo Contrast  02/13/2016  CLINICAL DATA:  60 year old male with unwitnessed fall, on heparin. Initial encounter. EXAM: CT HEAD WITHOUT CONTRAST TECHNIQUE: Contiguous axial images were obtained from the base of the skull through the vertex without intravenous contrast. COMPARISON:  10/30/2015 noncontrast head CT.  Brain MRI 10/28/2015. FINDINGS: Visualized paranasal sinuses and mastoids are clear. Mild right parietal convexity scalp soft tissue scarring again noted. No acute scalp hematoma identified. Visualized orbit soft tissues are within normal limits. Calvarium intact. No acute osseous abnormality identified. Calcified atherosclerosis at the skull base. Cerebral white matter changes re- demonstrated and appears stable from the January MRI. Stable gray-white matter differentiation. No cortically based acute infarct identified. No midline shift, mass effect, or evidence of intracranial mass lesion. No acute intracranial hemorrhage identified. No ventriculomegaly. No suspicious intracranial  vascular hyperdensity. IMPRESSION: No acute intracranial abnormality. Stable nonspecific cerebral white matter signal changes since the brain MRI in January. Electronically Signed   By: Genevie Ann M.D.   On: 02/13/2016 18:18   Dg Chest Portable 1 View  03/09/2016  CLINICAL DATA:  Acute onset of shortness of breath and cough. Initial encounter. EXAM: PORTABLE CHEST 1 VIEW COMPARISON:  Chest radiograph performed 03/07/2016 FINDINGS: There is elevation of the left hemidiaphragm. Vascular congestion is noted. Increased interstitial markings raise concern for pulmonary edema. No definite pleural effusion or pneumothorax is seen. The cardiomediastinal silhouette is mildly enlarged. No acute osseous abnormalities are identified. IMPRESSION: Elevation of the left hemidiaphragm. Vascular congestion and mild cardiomegaly. Increased interstitial markings raise concern for pulmonary edema. Electronically Signed   By: Garald Balding M.D.   On: 03/09/2016 23:38    Time Spent in minutes  30   Jani Gravel M.D on 03/11/2016 at 1:08 PM  Between 7am to 7pm - Pager - [phone]  After 7pm go to [email or web address] - password [facility]  [facility] -  Office  [phone]

## 2016-03-12 ENCOUNTER — Inpatient Hospital Stay (HOSPITAL_COMMUNITY): Payer: Medicare Other

## 2016-03-12 DIAGNOSIS — R072 Precordial pain: Secondary | ICD-10-CM

## 2016-03-12 DIAGNOSIS — I340 Nonrheumatic mitral (valve) insufficiency: Secondary | ICD-10-CM

## 2016-03-12 DIAGNOSIS — I110 Hypertensive heart disease with heart failure: Secondary | ICD-10-CM | POA: Insufficient documentation

## 2016-03-12 DIAGNOSIS — I369 Nonrheumatic tricuspid valve disorder, unspecified: Secondary | ICD-10-CM

## 2016-03-12 LAB — GLUCOSE, CAPILLARY
GLUCOSE-CAPILLARY: 101 mg/dL — AB (ref 65–99)
GLUCOSE-CAPILLARY: 129 mg/dL — AB (ref 65–99)

## 2016-03-12 LAB — ECHOCARDIOGRAM COMPLETE
CHL CUP STROKE VOLUME: 44 mL
EERAT: 27.01
EWDT: 153 ms
LAVOLA4C: 130 mL
LV E/e' medial: 27.01
LV E/e'average: 27.01
LV SIMPSON'S DISK: 43
LV TDI E'LATERAL: 6.85
LV TDI E'MEDIAL: 5.11
LV dias vol index: 48 mL/m2
LV sys vol: 58 mL
LVDIAVOL: 102 mL (ref 62–150)
LVELAT: 6.85 cm/s
LVSYSVOLIN: 27 mL/m2
MV Dec: 153
MV M vel: 112
MV pk A vel: 108 m/s
MV pk E vel: 185 m/s
MVANNULUSVTI: 42.7 cm
MVPG: 14 mmHg
Mean grad: 6 mmHg
RV sys press: 26 mmHg
TAPSE: 24.9 cm
Weight: 3136 oz

## 2016-03-12 LAB — POCT I-STAT, CHEM 8
BUN: 56 mg/dL — ABNORMAL HIGH (ref 6–20)
CALCIUM ION: 1.01 mmol/L — AB (ref 1.12–1.23)
CHLORIDE: 99 mmol/L — AB (ref 101–111)
CREATININE: 10.3 mg/dL — AB (ref 0.61–1.24)
Glucose, Bld: 129 mg/dL — ABNORMAL HIGH (ref 65–99)
HCT: 31 % — ABNORMAL LOW (ref 39.0–52.0)
HEMOGLOBIN: 10.5 g/dL — AB (ref 13.0–17.0)
Potassium: 7.6 mmol/L (ref 3.5–5.1)
SODIUM: 134 mmol/L — AB (ref 135–145)
TCO2: 26 mmol/L (ref 0–100)

## 2016-03-12 LAB — TROPONIN I: Troponin I: <0.03 ng/mL (ref <0.031)

## 2016-03-12 MED ORDER — HYDRALAZINE HCL 10 MG PO TABS
10.0000 mg | ORAL_TABLET | Freq: Three times a day (TID) | ORAL | Status: DC
Start: 2016-03-12 — End: 2016-03-14

## 2016-03-12 MED ORDER — HYDROMORPHONE HCL 1 MG/ML IJ SOLN
0.5000 mg | INTRAMUSCULAR | Status: DC | PRN
  Administered 2016-03-12: 0.5 mg via INTRAVENOUS
  Filled 2016-03-12: qty 1

## 2016-03-12 MED ORDER — CARVEDILOL 25 MG PO TABS: 25.0000 mg | ORAL_TABLET | Freq: Two times a day (BID) | ORAL | Status: DC

## 2016-03-12 MED ORDER — HYDRALAZINE HCL 10 MG PO TABS
10.0000 mg | ORAL_TABLET | Freq: Three times a day (TID) | ORAL | Status: DC
  Administered 2016-03-12: 10 mg via ORAL
  Filled 2016-03-12 (×2): qty 1

## 2016-03-12 MED ORDER — SEVELAMER CARBONATE 800 MG PO TABS: 3200.0000 mg | ORAL_TABLET | Freq: Three times a day (TID) | ORAL | Status: AC

## 2016-03-12 NOTE — Progress Notes (Signed)
2D EchocardiogramEchocardiogram 2D Echocardiogram has been performed.  Joelene Millin 03/12/2016, 12:35 PM

## 2016-03-12 NOTE — Progress Notes (Signed)
Pt discharged home with friend.  Reviewed discharge instructions and education, all questions answered.  Assessment unchanged from earlier.

## 2016-03-12 NOTE — Discharge Summary (Signed)
Physician Discharge Summary  [name] [name] [id number] DOB: Dec 21, 1955 DOA: 03/09/2016  PCP: [name]  Admit date: 03/09/2016 Discharge date: 03/12/2016   Recommendations for Outpatient Follow-Up:   1. Needs consistant dialysis and dietary compliance   Discharge Diagnosis:   Principal Problem:   Hyperkalemia Active Problems:   Chest pain, atypical   Hypertension   CAD -S/P LAD BMS 2011, LAD DES 2012- patent cors Feb 2016   ESRD (end stage renal disease) on dialysis ([place])   Acute on chronic diastolic CHF (congestive heart failure), NYHA class 1 ([place])   Pulmonary edema   Type II diabetes mellitus ([place])   Hypertensive heart disease with heart failure ([place])   Mitral regurgitation   Discharge disposition:  Home.  Discharge Condition: Improved.  Diet recommendation: renal diet  Wound care: None.   History of Present Illness:   [name] [name] is a 60 y.o. male with a history of ESRD on HD (T, Th, Sat), CAD, HTN, Hyperlipidemia who presents to the ED with complaints of substernal Chest Pain described as Chest Tightness with SOB since the evening. He denies missing any dialysis treatents, and reports that his last Dialysis Rx was Thursday ( at the [facility]). He was found to have Pulmonary edam on Chest X-ray and was placed on BiPAP in the ED, and his labs revealed Hyperkalemia at 7.6. His BUN/Cr was 56/10.30. He was administered IV Dextrose, Insulin, and Calcium in the ED and Nephrology Dr Jimmy Footman was consulted for emergent Dialysis tonight and again in the [place]. He was referred for medical admission.    Hospital Course by Problem:   Hypertension uncontrolled Off labetalol, per nephrology Increase carvedilol to 25mg  po bid Cont hydralazine   Cp atypical but due to history of underlying heart disease -CE negative -cards consulted- no further intervention  cardiac murmer Echo done: There is no significant difference from the prior study  on  05/18/2015.  Dm2 Cont SSI  ESRD on HD (T, T, S) Appreciate nephrology input  Hyperkalemia Resolved.    CAD  Cont plavix, lipitor,   Anemia Per nephro    Medical Consultants:    Renal  cards   Discharge Exam:   Filed Vitals:   03/12/16 0831 03/12/16 1258  BP:    Pulse: 88 89  Temp: 97.9 F (36.6 C) 97.5 F (36.4 C)  Resp: 17 15   Filed Vitals:   03/12/16 0736 03/12/16 0831 03/12/16 1100 03/12/16 1258  BP: 146/97     Pulse:  88  89  Temp:  97.9 F (36.6 C)  97.5 F (36.4 C)  TempSrc:  Oral  Oral  Resp: 12 17  15   Height:   6' (1.829 m)   Weight:      SpO2:    97%      The results of significant diagnostics from this hospitalization (including imaging, microbiology, ancillary and laboratory) are listed below for reference.     Procedures and Diagnostic Studies:   Dg Chest Portable 1 View  03/09/2016  CLINICAL DATA:  Acute onset of shortness of breath and cough. Initial encounter. EXAM: PORTABLE CHEST 1 VIEW COMPARISON:  Chest radiograph performed 03/07/2016 FINDINGS: There is elevation of the left hemidiaphragm. Vascular congestion is noted. Increased interstitial markings raise concern for pulmonary edema. No definite pleural effusion or pneumothorax is seen. The cardiomediastinal silhouette is mildly enlarged. No acute osseous abnormalities are identified. IMPRESSION: Elevation of the left hemidiaphragm. Vascular congestion and mild cardiomegaly. Increased interstitial markings raise  concern for pulmonary edema. Electronically Signed   By: Garald Balding M.D.   On: 03/09/2016 23:38     Labs:   Basic Metabolic Panel:  Recent Labs Lab 03/07/16 1100 03/10/16 0006 03/10/16 0932 03/11/16 0325  NA 139 134*  134* 139 137  K 3.7 7.6*  7.6* 3.6 4.5  CL 100* 99*  99* 98* 96*  CO2 27  --  30 32  GLUCOSE 107* 129*  129* 151* 125*  BUN 32* 56*  56* 18 9  CREATININE 7.58* 10.30*  10.30* 6.25* 4.89*  CALCIUM 9.6  --  9.6 9.3    GFR Estimated Creatinine Clearance: 18.1 mL/min (by C-G formula based on Cr of 4.89). Liver Function Tests:  Recent Labs Lab 03/07/16 1100 03/11/16 0325  AST 11* 9*  ALT 13* 10*  ALKPHOS 106 78  BILITOT 0.8 0.9  PROT 7.6 6.8  ALBUMIN 3.7 3.1*   No results for input(s): LIPASE, AMYLASE in the last 168 hours. No results for input(s): AMMONIA in the last 168 hours. Coagulation profile  Recent Labs Lab 03/07/16 1100  INR 1.37    CBC:  Recent Labs Lab 03/07/16 1100 03/10/16 0006 03/10/16 0057 03/10/16 0932 03/11/16 0325  WBC 5.7  --  6.3 5.9 6.4  NEUTROABS  --   --  PENDING  --   --   HGB 10.4* 10.5*  10.5* 10.0* 10.5* 10.4*  HCT 34.8* 31.0*  31.0* 33.4* 36.8* 35.5*  MCV 79.6  --  78.8 80.7 80.7  PLT 136*  --  138* 119* 125*   Cardiac Enzymes:  Recent Labs Lab 03/12/16 0938 03/12/16 1325  TROPONINI <0.03 <0.03   BNP: Invalid input(s): POCBNP CBG:  Recent Labs Lab 03/10/16 2251 03/11/16 2123 03/12/16 0728 03/12/16 1121  GLUCAP 130* 130* 101* 129*   D-Dimer No results for input(s): DDIMER in the last 72 hours. Hgb A1c No results for input(s): HGBA1C in the last 72 hours. Lipid Profile No results for input(s): CHOL, HDL, LDLCALC, TRIG, CHOLHDL, LDLDIRECT in the last 72 hours. Thyroid function studies No results for input(s): TSH, T4TOTAL, T3FREE, THYROIDAB in the last 72 hours.  Invalid input(s): FREET3 Anemia work up No results for input(s): VITAMINB12, FOLATE, FERRITIN, TIBC, IRON, RETICCTPCT in the last 72 hours. Microbiology No results found for this or any previous visit (from the past 240 hour(s)).   Discharge Instructions:   Discharge Instructions    Discharge instructions    Complete by:  As directed   Continue HD t/th/sat Renal/carb mod diet     Increase activity slowly    Complete by:  As directed             Medication List    STOP taking these medications        Darbepoetin Alfa 25 MCG/0.42ML Sosy injection   Commonly known as:  ARANESP     doxercalciferol 4 MCG/2ML injection  Commonly known as:  HECTOROL     ferric gluconate 62.5 mg in sodium chloride 0.9 % 100 mL     isosorbide mononitrate 30 MG 24 hr tablet  Commonly known as:  IMDUR     nitroGLYCERIN 0.4 MG SL tablet  Commonly known as:  NITROSTAT     simethicone 80 MG chewable tablet  Commonly known as:  GAS-X     VELPHORO 500 MG chewable tablet  Generic drug:  sucroferric oxyhydroxide      TAKE these medications        albuterol 108 (90 Base) MCG/ACT  inhaler  Commonly known as:  PROVENTIL HFA;VENTOLIN HFA  Inhale 1-2 puffs into the lungs every 6 (six) hours as needed for wheezing or shortness of breath.     amitriptyline 100 MG tablet  Commonly known as:  ELAVIL  Take 1 tablet (100 mg total) by mouth at bedtime.     amLODipine 10 MG tablet  Commonly known as:  NORVASC  Take 1 tablet (10 mg total) by mouth daily.     aspirin 81 MG chewable tablet  Chew 1 tablet (81 mg total) by mouth daily.     atorvastatin 20 MG tablet  Commonly known as:  LIPITOR  Take 20 mg by mouth at bedtime.     carvedilol 25 MG tablet  Commonly known as:  COREG  Take 1 tablet (25 mg total) by mouth 2 (two) times daily with a meal.     cinacalcet 90 MG tablet  Commonly known as:  SENSIPAR  Take 90 mg by mouth at bedtime.     clopidogrel 75 MG tablet  Commonly known as:  PLAVIX  Take 1 tablet (75 mg total) by mouth daily.     hydrALAZINE 10 MG tablet  Commonly known as:  APRESOLINE  Take 1 tablet (10 mg total) by mouth every 8 (eight) hours.     losartan 100 MG tablet  Commonly known as:  COZAAR  Take 100 mg by mouth daily.     multivitamin Tabs tablet  Take 1 tablet by mouth at bedtime.     pantoprazole 40 MG tablet  Commonly known as:  PROTONIX  Take 1 tablet (40 mg total) by mouth daily.     pregabalin 50 MG capsule  Commonly known as:  LYRICA  Take 1 capsule (50 mg total) by mouth daily.     sevelamer carbonate 800 MG  tablet  Commonly known as:  RENVELA  Take 4 tablets (3,200 mg total) by mouth 3 (three) times daily with meals.     venlafaxine XR 75 MG 24 hr capsule  Commonly known as:  EFFEXOR XR  Take 1 capsule (75 mg total) by mouth daily with breakfast.           Follow-up Information    Please follow up.   Why:  PCP 1 week, continue HD t/th/sat       Time coordinating discharge: 35 min  Signed:  Sylwia Cuervo U Kellyjo Edgren   [facility] 03/12/2016, 3:08 PM

## 2016-03-12 NOTE — Consult Note (Signed)
CARDIOLOGY CONSULT NOTE   Patient ID: [name] [name] MRN: [id number] DOB/AGE: 1956-04-02 60 y.o.  Admit date: 03/09/2016  Primary Physician   [name] Primary Cardiologist: New Requesting MD: Dr. Eliseo Squires Reason for Consultation: chest pain   HPI: [name] is a 60 year old male with a past medical history of HTN, ESRD on HD, chronic diastolic CHF, DM, and CAD.   His last heart catheterization was in Feb of 2016, which showed no obstructive CAD, and patent stents in his LAD. He has a BMS in his mid LAD placed in 2011 and DES in mid LAD in 2012.   He presented to the ED on 03/09/16 with SOB, vomiting and chest pain. He describes the chest pain as a cramp that starts in the middle of his chest that radiates slightly to the right. No diaphoresis or nausea.He required Bipap for hypoxia. The day prior, he had only completed half of his dialysis treatment and per Renal's note has had recurrent issues with non adherence. He received emergent HD. No troponin was ordered so far. EKG shows normal sinus rhythm, with J point ST elevation inferior and early precordial leads.    He had an episode of chest pain characterized by the same cramping feeling yesterday at 11am while here. He was just lying in bed when it started. Pain lasted for about 10 minutes, no associated with movement. Endorses some SOB with this episode.   He notes that a "bump" appeared on the center of his chest a few days ago. He has a bruise just above his xyphoid process. He denies injury or fall. It is tender to palpation.   He required Bipap for hypoxia. The day prior, he had only completed half of his dialysis treatment and per Renal's note has had recurrent issues with non adherence. He received emergent HD. No troponin was ordered so far. EKG shows normal sinus rhythm, with J point ST elevation inferior and early precordial leads.   Reports that he does walk down the street a few times a week, denies any chest pain or SOB  with this.     Past Medical History  Diagnosis Date  . Hypertension   . Hematochezia     a. 2014: colonscopy, which showed moderately-sized internal hemorrhoids, two 16mm polyps in transverse colon and ascending colon that were resected, five 2-70mm polyps in sigmoid colon, descending colon, transverse colon, and ascending colon that were resected. An upper endoscopy was performed and showed normal esophagus, stomach, and duodenum.  . Hematuria     a. H/o hematuria 2014 with cystoscopy that was unrevealing for his source of hematuria. He underwent a kidney ultrasound on 10/14 that showed mildly echogenic and scarred kidneys compatible with medical renal disease, without hydronephrosis or renal calculi.  [name] Anemia   . CAD (coronary artery disease)     a. per [facility] s/p 3.78mm x 26mm [facility] BMS to mid LAD 12/2009 and Xience DES to mid LAD 10/2010.  . Colon polyps   . Chronic diastolic CHF (congestive heart failure) ([place])   . Hyperlipidemia   . Anginal pain ([place])   . Heart murmur   . 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 Type II diabetes mellitus ([place])   . History of blood transfusion     "had colonoscopy done; they had to give me some blood"  . Daily headache   . ESRD on dialysis [facility]) since ~  2008    "[facility]; TTS" (07/21/2015)  . On home oxygen therapy     "2L prn" (07/21/2015)  . Renal insufficiency      Past Surgical History  Procedure Laterality Date  . Left heart catheterization with coronary angiogram N/A 11/23/2014    Procedure: LEFT HEART CATHETERIZATION WITH CORONARY ANGIOGRAM;  Surgeon: Troy Sine, MD;  [facility] C[facility] LAB;  Service: Cardiovascular;  Laterality: N/A;  . Lithotripsy  X1  . Cystoscopy w/ stone manipulation  X2?  . Cardiac catheterization  "several"  . Coronary angioplasty with stent placement  "several"  . Eye surgery Bilateral     "laser OR for hemorrhage"  . Av fistula placement Left ~ 2007    "upper arm"     Allergies  Allergen Reactions  . Enalapril Hives    I have reviewed the patient's current medications . amitriptyline  100 mg Oral QHS  . amLODipine  10 mg Oral QHS  . aspirin  81 mg Oral Daily  . atorvastatin  20 mg Oral q1800  . carvedilol  25 mg Oral BID WC  . cinacalcet  90 mg Oral Q supper  . clopidogrel  75 mg Oral Daily  . heparin  5,000 Units Subcutaneous Q8H  . insulin glargine  10 Units Subcutaneous QHS  . losartan  100 mg Oral QHS  . multivitamin  1 tablet Oral QHS  . pantoprazole  40 mg Oral Daily  . pregabalin  50 mg Oral QHS  . sevelamer carbonate  3,200 mg Oral TID WC  . sodium chloride flush  3 mL Intravenous Q12H     sodium chloride, acetaminophen **OR** acetaminophen, HYDROmorphone (DILAUDID) injection, nitroGLYCERIN, ondansetron **OR** ondansetron (ZOFRAN) IV, sodium chloride flush, technetium TC 62M diethylenetriame-pentaacetic acid  Prior to Admission medications   Medication Sig Start Date End Date Taking? Authorizing Provider  albuterol (PROVENTIL HFA;VENTOLIN HFA) 108 (90 Base) MCG/ACT inhaler Inhale 1-2 puffs into the lungs every 6 (six) hours as needed for wheezing or shortness of breath. 01/19/16  Yes Delsa Grana, PA-C  amitriptyline (ELAVIL) 100 MG tablet Take 1 tablet (100 mg total) by mouth at bedtime. 07/27/15  Yes Shela Leff, MD  amLODipine (NORVASC) 10 MG tablet Take 1 tablet (10 mg total) by mouth daily. 10/30/15  Yes Ripudeep Krystal Eaton, MD  aspirin 81 MG chewable tablet Chew 1 tablet (81 mg total) by mouth daily. 07/22/15  Yes Iline Oven, MD  atorvastatin (LIPITOR) 20 MG tablet Take 20 mg by mouth at bedtime.    Yes Historical Provider, MD  carvedilol (COREG) 25 MG tablet Take 2 tablets (50 mg total) by mouth 2 (two) times daily. 10/30/15  Yes Ripudeep Krystal Eaton, MD  cinacalcet (SENSIPAR) 90 MG tablet Take 90 mg by mouth at bedtime.    Yes Historical Provider, MD  clopidogrel (PLAVIX) 75 MG tablet Take 1 tablet (75 mg total) by mouth  daily. 11/24/14  Yes Juluis Mire, MD  Darbepoetin Alfa (ARANESP) 25 MCG/0.42ML SOSY injection Inject 0.42 mLs (25 mcg total) into the vein every Tuesday with hemodialysis. 12/15/14  Yes Geradine Girt, DO  doxercalciferol (HECTOROL) 4 MCG/2ML injection Inject 1 mL (2 mcg total) into the vein Every Tuesday,Thursday,and Saturday with dialysis. 12/15/14  Yes Geradine Girt, DO  ferric gluconate 62.5 mg in sodium chloride 0.9 % 100 mL Inject 62.5 mg into the vein every Thursday with hemodialysis. 12/16/14  Yes Geradine Girt, DO  losartan (COZAAR) 100 MG tablet Take 100 mg by mouth  daily.   Yes Historical Provider, MD  multivitamin (RENA-VIT) TABS tablet Take 1 tablet by mouth at bedtime. 12/15/14  Yes Geradine Girt, DO  nitroGLYCERIN (NITROSTAT) 0.4 MG SL tablet Place 1 tablet (0.4 mg total) under the tongue every 5 (five) minutes as needed for chest pain. 06/05/15  Yes Janece Canterbury, MD  pantoprazole (PROTONIX) 40 MG tablet Take 1 tablet (40 mg total) by mouth daily. 06/05/15  Yes Janece Canterbury, MD  pregabalin (LYRICA) 50 MG capsule Take 1 capsule (50 mg total) by mouth daily. 12/15/14  Yes Geradine Girt, DO  sevelamer carbonate (RENVELA) 800 MG tablet Take 2,400 mg by mouth 3 (three) times daily with meals.    Yes Historical Provider, MD  simethicone (GAS-X) 80 MG chewable tablet Chew 1 tablet (80 mg total) by mouth every 6 (six) hours as needed for flatulence. 08/29/15  Yes Collier Salina, MD  sucroferric oxyhydroxide (VELPHORO) 500 MG chewable tablet Chew 1,000 mg by mouth 3 (three) times daily with meals.   Yes Historical Provider, MD  venlafaxine XR (EFFEXOR XR) 75 MG 24 hr capsule Take 1 capsule (75 mg total) by mouth daily with breakfast. 07/27/15  Yes Shela Leff, MD  isosorbide mononitrate (IMDUR) 30 MG 24 hr tablet Take 1 tablet (30 mg total) by mouth daily. Patient not taking: Reported on 01/19/2016 10/30/15   Ripudeep Krystal Eaton, MD     Social History   Social History  . Marital Status:  Divorced    Spouse Name: N/A  . Number of Children: N/A  . Years of Education: N/A   Occupational History  . Not on file.   Social History Main Topics  . Smoking status: Former Smoker -- 0.50 packs/day for 8 years    Types: Cigarettes    Quit date: 12/06/2010  . Smokeless tobacco: Never Used  . Alcohol Use: No  . Drug Use: No  . Sexual Activity: Not Currently   Other Topics Concern  . Not on file   Social History Narrative   Moved from [place], [place] to [place] in 11/2014.    Family Status  Relation Status Death Age  . Mother Deceased   . Father Deceased    Family History  Problem Relation Age of Onset  . Hypertension    . Bone cancer Mother   . Anuerysm Father   . Diabetes type II Daughter      ROS:  Full 14 point review of systems complete and found to be negative unless listed above.  Physical Exam: Blood pressure 146/97, pulse 88, temperature 97.9 F (36.6 C), temperature source Oral, resp. rate 17, weight 196 lb (88.905 kg), SpO2 96 %.  General: Well developed, well nourished, male in no acute distress Head: Eyes PERRLA, No xanthomas.   Normocephalic and atraumatic, oropharynx without edema or exudate Lungs: CTA Heart: HRRR S1 S2, no rub/gallop, No murmur. pulses are 2+ extrem.   Neck: No carotid bruits. No lymphadenopathy.  No JVD. Abdomen: Bowel sounds present, abdomen soft and non-tender without masses or hernias noted. Msk:  No spine or cva tenderness. No weakness, no joint deformities or effusions. 3-4 cm bruise center of chest Extremities: No clubbing or cyanosis.  No edema. Left upper arm AV fistula  Neuro: Alert and oriented X 3. No focal deficits noted. Psych:  Good affect, responds appropriately Skin: No rashes or lesions noted.  Labs:   Lab Results  Component Value Date   WBC 6.4 03/11/2016   HGB 10.4* 03/11/2016  HCT 35.5* 03/11/2016   MCV 80.7 03/11/2016   PLT 125* 03/11/2016   Recent Labs Lab 03/11/16 0325  NA 137  K 4.5  CL  96*  CO2 32  BUN 9  CREATININE 4.89*  CALCIUM 9.3  PROT 6.8  BILITOT 0.9  ALKPHOS 78  ALT 10*  AST 9*  GLUCOSE 125*  ALBUMIN 3.1*    Echo:  LV EF: 45% - 50% Study Conclusions  - Left ventricle: The cavity size was mildly dilated. Wall  thickness was normal. Systolic function was mildly reduced. The  estimated ejection fraction was in the range of 45% to 50%. Mild  diffuse hypokinesis with no identifiable regional variations.  Features are consistent with a pseudonormal left ventricular  filling pattern, with concomitant abnormal relaxation and  increased filling pressure (grade 2 diastolic dysfunction). - Aortic valve: There was trivial regurgitation. - Mitral valve: Moderately calcified annulus. There was moderate  regurgitation directed centrally. Valve area by pressure  half-time: 2.44 cm^2. Valve area by continuity equation (using  LVOT flow): 1.39 cm^2. - Left atrium: The atrium was severely dilated. - Right ventricle: The cavity size was moderately dilated. Systolic  function was mildly reduced. - Right atrium: The atrium was mildly dilated.  Impressions:  - Direct image comparison with the study from February 2016  confirms a slight reduction in LV systolic function and worsening  secondary mitral insufficiency, as well as elevation in mean left  atrial pressure.  ECG: NSR   Radiology:  Dg Chest 2 View  03/11/2016  CLINICAL DATA:  Mid chest pain off and on for 2 years. Hypertension. EXAM: CHEST  2 VIEW COMPARISON:  03/09/2016 FINDINGS: Cardiomegaly with vascular congestion. Diffuse bilateral airspace opacities, likely mild edema. This is slightly improved since prior study. Mild peribronchial thickening. No effusions or acute bony abnormality. IMPRESSION: Cardiomegaly with mild edema/ CHF, slightly improved since prior study. Electronically Signed   By: Rolm Baptise M.D.   On: 03/11/2016 15:48   Nm Pulmonary Perf And Vent  03/11/2016  CLINICAL  DATA:  Shortness of Breath EXAM: NUCLEAR MEDICINE VENTILATION - PERFUSION LUNG SCAN TECHNIQUE: Ventilation images were obtained in multiple projections using inhaled aerosol Tc-43m DTPA. Perfusion images were obtained in multiple projections after intravenous injection of Tc-59m MAA. RADIOPHARMACEUTICALS:  32.2 mCi Technetium-37m DTPA aerosol inhalation and 4.35 mCi Technetium-73m MAA IV COMPARISON:  Chest x-ray 03/09/2016 FINDINGS: Ventilation: No focal ventilation defect. Perfusion: No wedge shaped peripheral perfusion defects to suggest acute pulmonary embolism. IMPRESSION: No evidence of pulmonary embolus. Electronically Signed   By: Rolm Baptise M.D.   On: 03/11/2016 14:01    ASSESSMENT AND PLAN:    Principal Problem:   Hyperkalemia Active Problems:   Chest pain, atypical   Hypertension   CAD -S/P LAD BMS 2011, LAD DES 2012- patent cors Feb 2016   ESRD (end stage renal disease) on dialysis ([place])   Acute on chronic diastolic CHF (congestive heart failure), NYHA class 1 ([place])   Pulmonary edema   Type II diabetes mellitus ([place])  1. Atypical chest pain: Patient presents with chest pain that is reproducable with palpation. He has a small bruise on his sternum. Patient reports at home we will occasionally have some intermittent chest pain, no associated with activity but it is unclear whether or not it is related to the current cramping feeling he has. He fell asleep more than 2 times while I was in the room speaking with him.   He had a [employer] in August  2016 that was intermediate risk with mid to apical inferolateral wall defect consistent with scar. He had a cath in February of 2016 that showed no obstructive CAD. He is on Plavix at home. We will cycle troponin and follow. If elevated will re evaluate need for ischemic work up. His pain seems to be musculoskeletal. Echo pending. Will set up patient to be seen outpatient with Cardiology.   2. HTN: Patient is hypertensive. He is on  losartan, coreg and amlodipine. Will continue current regimen.   3. ESRD on HD: For HD tomorrow. Management per Renal.     Signed: Arbutus Leas, NP 03/12/2016 9:36 AM Pager [phone]  Co-Sign MD

## 2016-03-12 NOTE — Plan of Care (Signed)
Problem: Phase III Progression Outcomes Goal: No anginal pain Outcome: Progressing Pt still experiencing some chest discomfort relieved with iv dilaudid  Goal: Discharge plan remains appropriate-arrangements made Outcome: Completed/Met Date Met:  03/12/16 Pt being discharged home today  Goal: Tolerating diet Outcome: Completed/Met Date Met:  03/12/16 Pt able to tolerate diet with no difficulties   Problem: Discharge Progression Outcomes Goal: No anginal pain Outcome: Progressing Pt still experiencing some chest pain and relieved with dilaudid iv Goal: Discharge plan in place and appropriate Outcome: Completed/Met Date Met:  03/12/16 Pt being discharged home today  Goal: Tolerates diet Outcome: Completed/Met Date Met:  03/12/16 Pt able to tolerate current diet with no difficulties   Problem: Education: Goal: Knowledge of [place] Education information/materials will improve Outcome: Completed/Met Date Met:  03/12/16 Pt educated throughout entire hospitalization regarding tests, procedures, medications, and available resources   Problem: Safety: Goal: Ability to remain free from injury will improve Outcome: Completed/Met Date Met:  03/12/16 Pt has remained free from injury during hospitalization   Problem: Pain Managment: Goal: General experience of comfort will improve Outcome: Progressing Pt still experiencing some chest pain

## 2016-03-12 NOTE — Progress Notes (Signed)
Patient ID: [name] [name], male   DOB: Apr 18, 1956, 60 y.o.   MRN: [id number]                                                                PROGRESS NOTE                                                                                                                                                                                                             Patient Demographics:    [name] [name], is a 60 y.o. male, DOB - August 09, 1956, [id number]  Admit date - 03/09/2016   Admitting Physician Theressa Millard, MD  Outpatient Primary MD for the patient is [name]  LOS - 2  Outpatient Specialists: nephrology  Chief Complaint  Patient presents with  . Shortness of Breath       Brief Narrative   [name] [name] is a 60 y.o. male with a history of ESRD on HD (T, Th, Sat), CAD, HTN, Hyperlipidemia who presents to the ED with complaints of substernal Chest Pain described as Chest Tightness with SOB since the evening. He denies missing any dialysis treatents, and reports that his last Dialysis Rx was Thursday ( at the [facility]). He was found to have Pulmonary edam on Chest X-ray and was placed on BiPAP in the ED, and his labs revealed Hyperkalemia at 7.6. His BUN/Cr was 56/10.30. He was administered IV Dextrose, Insulin, and Calcium in the ED and Nephrology Dr Jimmy Footman was consulted for emergent Dialysis tonight and again in the [place]. He was referred for medical admission.    Subjective:   C/o chest pain this AM-- center of chest-- feels like muscle spasm     Assessment  & Plan :    Principal Problem:   Hyperkalemia Active Problems:   Chest pain, atypical   Hypertension   CAD -S/P LAD BMS 2011, LAD DES 2012- patent cors Feb 2016   ESRD (end stage renal disease) on dialysis ([place])   Acute on chronic diastolic CHF (congestive heart failure), NYHA class 1 ([place])   Pulmonary edema   Type II diabetes mellitus ([place])    Hypertension uncontrolled Off labetalol,  per nephrology Increase carvedilol to 25mg  po bid Cont hydralazine    Cp atypical but due to history of underlying  heart disease -will order CE -cards consult-- outpatient ST?  cardiac murmer Check cardiac echo.    Dm2 Cont SSI   ESRD on HD (T, T, S) Appreciate nephrology input   Hyperkalemia Resolved.    CAD  Cont plavix, lipitor,   Anemia Per nephro   Code Status : FULL CODE  Family Communication : none  Disposition Plan : home once work up complete-- later today or in AM  Barriers For Discharge : none  Consults : nephrology cardiology  Procedures : none  DVT Prophylaxis : Heparin - SCDs      Lab Results  Component Value Date   PLT 125* 03/11/2016    Antibiotics  :    Anti-infectives    None        Objective:   Filed Vitals:   03/12/16 0000 03/12/16 0340 03/12/16 0736 03/12/16 0831  BP: 151/87 143/97 146/97   Pulse:  85  88  Temp:  97.5 F (36.4 C)  97.9 F (36.6 C)  TempSrc:  Oral  Oral  Resp: 15 17 12 17   Weight:      SpO2:  96%      Wt Readings from Last 3 Encounters:  03/11/16 88.905 kg (196 lb)  03/07/16 93.2 kg (205 lb 7.5 oz)  02/21/16 91.2 kg (201 lb 1 oz)     Intake/Output Summary (Last 24 hours) at 03/12/16 0915 Last data filed at 03/11/16 2000  Gross per 24 hour  Intake    720 ml  Output      0 ml  Net    720 ml     Physical Exam  Awake Alert, Oriented X 3, No new F.N deficits, Normal affect Gate.AT,PERRAL Supple Neck,No JVD, No cervical lymphadenopathy appriciated.  Symmetrical Chest wall movement, Good air movement bilaterally, CTAB RRR, No Parasternal Heave +ve B.Sounds, Abd Soft, No tenderness, No organomegaly appriciated, No rebound - guarding or rigidity. No Cyanosis, Clubbing or edema, No new Rash or bruise       Data Review:    CBC  Recent Labs Lab 03/07/16 1100 03/10/16 0006 03/10/16 0057 03/10/16 0932 03/11/16 0325  WBC 5.7  --  6.3 5.9 6.4  HGB 10.4* 10.5* 10.0* 10.5* 10.4*    HCT 34.8* 31.0* 33.4* 36.8* 35.5*  PLT 136*  --  138* 119* 125*  MCV 79.6  --  78.8 80.7 80.7  MCH 23.8*  --  23.6* 23.0* 23.6*  MCHC 29.9*  --  29.9* 28.5* 29.3*  RDW 20.1*  --  20.4* 20.5* 20.8*  LYMPHSABS  --   --  PENDING  --   --   MONOABS  --   --  PENDING  --   --   EOSABS  --   --  PENDING  --   --   BASOSABS  --   --  PENDING  --   --     Chemistries   Recent Labs Lab 03/07/16 1100 03/10/16 0006 03/10/16 0932 03/11/16 0325  NA 139 134* 139 137  K 3.7 7.6* 3.6 4.5  CL 100* 99* 98* 96*  CO2 27  --  30 32  GLUCOSE 107* 129* 151* 125*  BUN 32* 56* 18 9  CREATININE 7.58* 10.30* 6.25* 4.89*  CALCIUM 9.6  --  9.6 9.3  AST 11*  --   --  9*  ALT 13*  --   --  10*  ALKPHOS 106  --   --  78  BILITOT 0.8  --   --  0.9   ------------------------------------------------------------------------------------------------------------------ No results for input(s): CHOL, HDL, LDLCALC, TRIG, CHOLHDL, LDLDIRECT in the last 72 hours.  Lab Results  Component Value Date   HGBA1C 6.1* 10/29/2015   ------------------------------------------------------------------------------------------------------------------ No results for input(s): TSH, T4TOTAL, T3FREE, THYROIDAB in the last 72 hours.  Invalid input(s): FREET3 ------------------------------------------------------------------------------------------------------------------ No results for input(s): VITAMINB12, FOLATE, FERRITIN, TIBC, IRON, RETICCTPCT in the last 72 hours.  Coagulation profile  Recent Labs Lab 03/07/16 1100  INR 1.37    No results for input(s): DDIMER in the last 72 hours.  Cardiac Enzymes No results for input(s): CKMB, TROPONINI, MYOGLOBIN in the last 168 hours.  Invalid input(s): CK ------------------------------------------------------------------------------------------------------------------    Component Value Date/Time   BNP 810.9* 03/10/2016 0058    Inpatient Medications  Scheduled  Meds: . amitriptyline  100 mg Oral QHS  . amLODipine  10 mg Oral QHS  . aspirin  81 mg Oral Daily  . atorvastatin  20 mg Oral q1800  . carvedilol  25 mg Oral BID WC  . cinacalcet  90 mg Oral Q supper  . clopidogrel  75 mg Oral Daily  . heparin  5,000 Units Subcutaneous Q8H  . insulin glargine  10 Units Subcutaneous QHS  . losartan  100 mg Oral QHS  . multivitamin  1 tablet Oral QHS  . pantoprazole  40 mg Oral Daily  . pregabalin  50 mg Oral QHS  . sevelamer carbonate  3,200 mg Oral TID WC  . sodium chloride flush  3 mL Intravenous Q12H   Continuous Infusions:  PRN Meds:.sodium chloride, acetaminophen **OR** acetaminophen, HYDROmorphone (DILAUDID) injection, nitroGLYCERIN, ondansetron **OR** ondansetron (ZOFRAN) IV, sodium chloride flush, technetium TC 53M diethylenetriame-pentaacetic acid  Micro Results No results found for this or any previous visit (from the past 240 hour(s)).  Radiology Reports Dg Chest 2 View  03/11/2016  CLINICAL DATA:  Mid chest pain off and on for 2 years. Hypertension. EXAM: CHEST  2 VIEW COMPARISON:  03/09/2016 FINDINGS: Cardiomegaly with vascular congestion. Diffuse bilateral airspace opacities, likely mild edema. This is slightly improved since prior study. Mild peribronchial thickening. No effusions or acute bony abnormality. IMPRESSION: Cardiomegaly with mild edema/ CHF, slightly improved since prior study. Electronically Signed   By: Rolm Baptise M.D.   On: 03/11/2016 15:48   Dg Chest 2 View  03/07/2016  CLINICAL DATA:  Central chest pain for 2 days EXAM: CHEST  2 VIEW COMPARISON:  02/19/2016 FINDINGS: Cardiac shadow is stable. Interstitial changes are again identified which may represent a degree of interstitial edema. No focal confluent infiltrate is seen. The overall appearance is stable from the prior study. IMPRESSION: No significant interval change from the prior exam. Electronically Signed   By: Inez Catalina M.D.   On: 03/07/2016 12:51   Dg Chest 2  View  02/19/2016  CLINICAL DATA:  Shortness of breath and chest pain for 2 weeks. EXAM: CHEST  2 VIEW COMPARISON:  02/13/2016 FINDINGS: Shallow inspiration with elevation of the left hemidiaphragm. Heart size and pulmonary vascularity are normal. Central interstitial pattern to the lungs with mild bronchial wall thickening may indicate chronic bronchitis. No focal airspace disease or consolidation. No blunting of costophrenic angles. No pneumothorax. Calcification of the aorta. IMPRESSION: Central interstitial changes in the lungs possibly representing chronic bronchitis or interstitial fibrosis. No evidence of active pulmonary disease. Electronically Signed   By: Lucienne Capers M.D.   On: 02/19/2016 21:39   Dg Chest 2 View  02/13/2016  CLINICAL DATA:  [place] getting out of bed. Losing  consciousness. Right chest pain and right shoulder pain. EXAM: CHEST  2 VIEW COMPARISON:  01/19/2016 FINDINGS: Heart size is mildly enlarged but similar to the previous examination. Lungs are clear without airspace disease, pulmonary edema or pneumothorax. Patient is mildly rotated towards the right. No large pleural effusions. Bony thorax appears to be intact. IMPRESSION: No acute cardiopulmonary disease. Electronically Signed   By: Markus Daft M.D.   On: 02/13/2016 17:24   Ct Head Wo Contrast  02/13/2016  CLINICAL DATA:  59 year old male with unwitnessed fall, on heparin. Initial encounter. EXAM: CT HEAD WITHOUT CONTRAST TECHNIQUE: Contiguous axial images were obtained from the base of the skull through the vertex without intravenous contrast. COMPARISON:  10/30/2015 noncontrast head CT.  Brain MRI 10/28/2015. FINDINGS: Visualized paranasal sinuses and mastoids are clear. Mild right parietal convexity scalp soft tissue scarring again noted. No acute scalp hematoma identified. Visualized orbit soft tissues are within normal limits. Calvarium intact. No acute osseous abnormality identified. Calcified atherosclerosis at the skull  base. Cerebral white matter changes re- demonstrated and appears stable from the January MRI. Stable gray-white matter differentiation. No cortically based acute infarct identified. No midline shift, mass effect, or evidence of intracranial mass lesion. No acute intracranial hemorrhage identified. No ventriculomegaly. No suspicious intracranial vascular hyperdensity. IMPRESSION: No acute intracranial abnormality. Stable nonspecific cerebral white matter signal changes since the brain MRI in January. Electronically Signed   By: Genevie Ann M.D.   On: 02/13/2016 18:18   Nm Pulmonary Perf And Vent  03/11/2016  CLINICAL DATA:  Shortness of Breath EXAM: NUCLEAR MEDICINE VENTILATION - PERFUSION LUNG SCAN TECHNIQUE: Ventilation images were obtained in multiple projections using inhaled aerosol Tc-40m DTPA. Perfusion images were obtained in multiple projections after intravenous injection of Tc-4m MAA. RADIOPHARMACEUTICALS:  32.2 mCi Technetium-64m DTPA aerosol inhalation and 4.35 mCi Technetium-75m MAA IV COMPARISON:  Chest x-ray 03/09/2016 FINDINGS: Ventilation: No focal ventilation defect. Perfusion: No wedge shaped peripheral perfusion defects to suggest acute pulmonary embolism. IMPRESSION: No evidence of pulmonary embolus. Electronically Signed   By: Rolm Baptise M.D.   On: 03/11/2016 14:01   Dg Chest Portable 1 View  03/09/2016  CLINICAL DATA:  Acute onset of shortness of breath and cough. Initial encounter. EXAM: PORTABLE CHEST 1 VIEW COMPARISON:  Chest radiograph performed 03/07/2016 FINDINGS: There is elevation of the left hemidiaphragm. Vascular congestion is noted. Increased interstitial markings raise concern for pulmonary edema. No definite pleural effusion or pneumothorax is seen. The cardiomediastinal silhouette is mildly enlarged. No acute osseous abnormalities are identified. IMPRESSION: Elevation of the left hemidiaphragm. Vascular congestion and mild cardiomegaly. Increased interstitial markings raise  concern for pulmonary edema. Electronically Signed   By: Garald Balding M.D.   On: 03/09/2016 23:38    Time Spent in minutes  [place] DO on 03/12/2016 at 9:15 AM  Between 7am to 7pm - Pager - [phone]  After 7pm go to [email or web address] - password [facility]  [facility] -  Office  [phone]

## 2016-03-12 NOTE — Progress Notes (Signed)
Admit: 03/09/2016 LOS: 2  82M ESRD (AF [facility], THS) admit with CP, SOB, HTN, and Hyperkalemia related to dialysis noncompliance.    Subjective:  No c/o   06/04 0701 - 06/05 0700 In: 720 [P.O.:720] Out: -   Filed Weights   03/10/16 1507 03/10/16 1834 03/11/16 0600  Weight: 83.6 kg (184 lb 4.9 oz) 82.1 kg (181 lb) 88.905 kg (196 lb)    Scheduled Meds: . amitriptyline  100 mg Oral QHS  . amLODipine  10 mg Oral QHS  . aspirin  81 mg Oral Daily  . atorvastatin  20 mg Oral q1800  . carvedilol  25 mg Oral BID WC  . cinacalcet  90 mg Oral Q supper  . clopidogrel  75 mg Oral Daily  . heparin  5,000 Units Subcutaneous Q8H  . insulin glargine  10 Units Subcutaneous QHS  . losartan  100 mg Oral QHS  . multivitamin  1 tablet Oral QHS  . pantoprazole  40 mg Oral Daily  . pregabalin  50 mg Oral QHS  . sevelamer carbonate  3,200 mg Oral TID WC  . sodium chloride flush  3 mL Intravenous Q12H   Continuous Infusions:  PRN Meds:.sodium chloride, acetaminophen **OR** acetaminophen, HYDROmorphone (DILAUDID) injection, nitroGLYCERIN, ondansetron **OR** ondansetron (ZOFRAN) IV, sodium chloride flush, technetium TC 96M diethylenetriame-pentaacetic acid  Current Labs: reviewed    Physical Exam:  Blood pressure 146/97, pulse 88, temperature 97.9 F (36.6 C), temperature source Oral, resp. rate 17, weight 88.905 kg (196 lb), SpO2 96 %. RRR +B/T of AVF CTAB No LEE Nonfocal NCAT EOMI  Dialyzes at [facility] on TTS . Primary Nephrologist [place]. EDW 90kg. HD Bath 2/2, Dialyzer 18, Heparin strd. Access LUA AVF.  A 1. ESRD THS LUE AVF 2. Therapeutic Noncompliance 3. HTN 4. Hyperkalemia, resolved 5. Anemia of ESRD 6. CKD-BMD 7. DM2 8. CAD  P 1. Cont THS HD via AVF if here   Pearson Grippe MD 03/12/2016, 10:01 AM   Recent Labs Lab 03/07/16 1100 03/10/16 0006 03/10/16 0932 03/11/16 0325  NA 139 134* 139 137  K 3.7 7.6* 3.6 4.5  CL 100* 99* 98* 96*  CO2 27  --  30 32  GLUCOSE 107*  129* 151* 125*  BUN 32* 56* 18 9  CREATININE 7.58* 10.30* 6.25* 4.89*  CALCIUM 9.6  --  9.6 9.3    Recent Labs Lab 03/10/16 0057 03/10/16 0932 03/11/16 0325  WBC 6.3 5.9 6.4  NEUTROABS PENDING  --   --   HGB 10.0* 10.5* 10.4*  HCT 33.4* 36.8* 35.5*  MCV 78.8 80.7 80.7  PLT 138* 119* 125*

## 2016-03-12 NOTE — Care Management Important Message (Signed)
Important Message  Patient Details  Name: [name] [name] MRN: [id number] Date of Birth: [birth year]   Medicare Important Message Given:  Yes    Nathen May 03/12/2016, 12:09 PM

## 2016-03-12 NOTE — Progress Notes (Signed)
Pt called out with 5/10 aching chest pain.  Pt refused to take sublingual nitro stating it doesn't help.  PRN dilaudid administered.  Will continue to closely monitor.

## 2016-03-13 DIAGNOSIS — D631 Anemia in chronic kidney disease: Secondary | ICD-10-CM | POA: Diagnosis not present

## 2016-03-13 DIAGNOSIS — N186 End stage renal disease: Secondary | ICD-10-CM | POA: Diagnosis not present

## 2016-03-13 DIAGNOSIS — E119 Type 2 diabetes mellitus without complications: Secondary | ICD-10-CM | POA: Diagnosis not present

## 2016-03-13 DIAGNOSIS — N2581 Secondary hyperparathyroidism of renal origin: Secondary | ICD-10-CM | POA: Diagnosis not present

## 2016-03-14 ENCOUNTER — Ambulatory Visit (INDEPENDENT_AMBULATORY_CARE_PROVIDER_SITE_OTHER): Payer: Medicare Other | Admitting: Internal Medicine

## 2016-03-14 ENCOUNTER — Encounter: Payer: Self-pay | Admitting: Internal Medicine

## 2016-03-14 VITALS — BP 145/77 | HR 87 | Temp 98.7°F | Ht 72.0 in | Wt 207.4 lb

## 2016-03-14 DIAGNOSIS — E1122 Type 2 diabetes mellitus with diabetic chronic kidney disease: Secondary | ICD-10-CM

## 2016-03-14 DIAGNOSIS — I120 Hypertensive chronic kidney disease with stage 5 chronic kidney disease or end stage renal disease: Secondary | ICD-10-CM

## 2016-03-14 DIAGNOSIS — E875 Hyperkalemia: Secondary | ICD-10-CM | POA: Diagnosis not present

## 2016-03-14 DIAGNOSIS — I10 Essential (primary) hypertension: Secondary | ICD-10-CM

## 2016-03-14 DIAGNOSIS — R0789 Other chest pain: Secondary | ICD-10-CM | POA: Diagnosis not present

## 2016-03-14 DIAGNOSIS — E119 Type 2 diabetes mellitus without complications: Secondary | ICD-10-CM

## 2016-03-14 DIAGNOSIS — Z992 Dependence on renal dialysis: Secondary | ICD-10-CM | POA: Diagnosis not present

## 2016-03-14 DIAGNOSIS — Z7982 Long term (current) use of aspirin: Secondary | ICD-10-CM

## 2016-03-14 DIAGNOSIS — N186 End stage renal disease: Secondary | ICD-10-CM

## 2016-03-14 LAB — GLUCOSE, CAPILLARY: GLUCOSE-CAPILLARY: 116 mg/dL — AB (ref 65–99)

## 2016-03-14 LAB — POCT GLYCOSYLATED HEMOGLOBIN (HGB A1C): Hemoglobin A1C: 5.6

## 2016-03-14 NOTE — Patient Instructions (Addendum)
1. Please call clinic when you get home and give [place] the name of your pharmacy so I can send refills of meds.   2. Please take all medications as prescribed.    3. If you have worsening of your symptoms or new symptoms arise, please call the clinic [phone] or go to the ER immediately if symptoms are severe.  Return to clinic for follow-up in 4 weeks.  Potassium Content of Foods Potassium is a mineral found in many foods and drinks. It helps keep fluids and minerals balanced in your body and affects how steadily your heart beats. Potassium also helps control your blood pressure and keep your muscles and nervous system healthy. Certain health conditions and medicines may change the balance of potassium in your body. When this happens, you can help balance your level of potassium through the foods that you do or do not eat. Your health care provider or dietitian may recommend an amount of potassium that you should have each day. The following lists of foods provide the amount of potassium (in parentheses) per serving in each item. HIGH IN POTASSIUM  The following foods and beverages have 200 mg or more of potassium per serving:  Apricots, 2 raw or 5 dry (200 mg).  Artichoke, 1 medium (345 mg).  Avocado, raw,  each (245 mg).  Banana, 1 medium (425 mg).  Beans, lima, or baked beans, canned,  cup (280 mg).  Beans, white, canned,  cup (595 mg).  Beef roast, 3 oz (320 mg).  Beef, ground, 3 oz (270 mg).  Beets, raw or cooked,  cup (260 mg).  Bran muffin, 2 oz (300 mg).  Broccoli,  cup (230 mg).  Brussels sprouts,  cup (250 mg).  Cantaloupe,  cup (215 mg).  Cereal, 100% bran,  cup (200-400 mg).  Cheeseburger, single, fast food, 1 each (225-400 mg).  Chicken, 3 oz (220 mg).  Clams, canned, 3 oz (535 mg).  Crab, 3 oz (225 mg).  Dates, 5 each (270 mg).  Dried beans and peas,  cup (300-475 mg).  Figs, dried, 2 each (260 mg).  Fish: halibut, tuna, cod, snapper,  3 oz (480 mg).  Fish: salmon, haddock, swordfish, perch, 3 oz (300 mg).  Fish, tuna, canned 3 oz (200 mg).  [place] fries, fast food, 3 oz (470 mg).  Granola with fruit and nuts,  cup (200 mg).  Grapefruit juice,  cup (200 mg).  Greens, beet,  cup (655 mg).  Honeydew melon,  cup (200 mg).  Kale, raw, 1 cup (300 mg).  Kiwi, 1 medium (240 mg).  Kohlrabi, rutabaga, parsnips,  cup (280 mg).  Lentils,  cup (365 mg).  Mango, 1 each (325 mg).  Milk, chocolate, 1 cup (420 mg).  Milk: nonfat, low-fat, whole, buttermilk, 1 cup (350-380 mg).  Molasses, 1 Tbsp (295 mg).  Mushrooms,  cup (280) mg.  Nectarine, 1 each (275 mg).  Nuts: almonds, peanuts, hazelnuts, [place], cashew, mixed, 1 oz (200 mg).  Nuts, pistachios, 1 oz (295 mg).  Orange, 1 each (240 mg).  Orange juice,  cup (235 mg).  Papaya, medium,  fruit (390 mg).  Peanut butter, chunky, 2 Tbsp (240 mg).  Peanut butter, smooth, 2 Tbsp (210 mg).  Pear, 1 medium (200 mg).  Pomegranate, 1 whole (400 mg).  Pomegranate juice,  cup (215 mg).  Pork, 3 oz (350 mg).  Potato chips, salted, 1 oz (465 mg).  Potato, baked with skin, 1 medium (925 mg).  Potatoes, boiled,  cup (255  mg).  Potatoes, mashed,  cup (330 mg).  Prune juice,  cup (370 mg).  Prunes, 5 each (305 mg).  Pudding, chocolate,  cup (230 mg).  Pumpkin, canned,  cup (250 mg).  Raisins, seedless,  cup (270 mg).  Seeds, sunflower or pumpkin, 1 oz (240 mg).  Soy milk, 1 cup (300 mg).  Spinach,  cup (420 mg).  Spinach, canned,  cup (370 mg).  Sweet potato, baked with skin, 1 medium (450 mg).  Swiss chard,  cup (480 mg).  Tomato or vegetable juice,  cup (275 mg).  Tomato sauce or puree,  cup (400-550 mg).  Tomato, raw, 1 medium (290 mg).  Tomatoes, canned,  cup (200-300 mg).  [place], 3 oz (250 mg).  Wheat germ, 1 oz (250 mg).  Winter squash,  cup (250 mg).  Yogurt, plain or fruited, 6 oz (260-435  mg).  Zucchini,  cup (220 mg). MODERATE IN POTASSIUM The following foods and beverages have 50-200 mg of potassium per serving:  Apple, 1 each (150 mg).  Apple juice,  cup (150 mg).  Applesauce,  cup (90 mg).  Apricot nectar,  cup (140 mg).  Asparagus, small spears,  cup or 6 spears (155 mg).  Bagel, cinnamon raisin, 1 each (130 mg).  Bagel, egg or plain, 4 in., 1 each (70 mg).  Beans, green,  cup (90 mg).  Beans, yellow,  cup (190 mg).  Beer, regular, 12 oz (100 mg).  Beets, canned,  cup (125 mg).  Blackberries,  cup (115 mg).  Blueberries,  cup (60 mg).  Bread, whole wheat, 1 slice (70 mg).  Broccoli, raw,  cup (145 mg).  Cabbage,  cup (150 mg).  Carrots, cooked or raw,  cup (180 mg).  Cauliflower, raw,  cup (150 mg).  Celery, raw,  cup (155 mg).  Cereal, bran flakes, cup (120-150 mg).  Cheese, cottage,  cup (110 mg).  Cherries, 10 each (150 mg).  Chocolate, 1 oz bar (165 mg).  Coffee, brewed 6 oz (90 mg).  Corn,  cup or 1 ear (195 mg).  Cucumbers,  cup (80 mg).  Egg, large, 1 each (60 mg).  Eggplant,  cup (60 mg).  Endive, raw, cup (80 mg).  English muffin, 1 each (65 mg).  Fish, orange roughy, 3 oz (150 mg).  Frankfurter, beef or pork, 1 each (75 mg).  Fruit cocktail,  cup (115 mg).  Grape juice,  cup (170 mg).  Grapefruit,  fruit (175 mg).  Grapes,  cup (155 mg).  Greens: kale, turnip, collard,  cup (110-150 mg).  Ice cream or frozen yogurt, chocolate,  cup (175 mg).  Ice cream or frozen yogurt, vanilla,  cup (120-150 mg).  Lemons, limes, 1 each (80 mg).  Lettuce, all types, 1 cup (100 mg).  Mixed vegetables,  cup (150 mg).  Mushrooms, raw,  cup (110 mg).  Nuts: walnuts, pecans, or macadamia, 1 oz (125 mg).  Oatmeal,  cup (80 mg).  Okra,  cup (110 mg).  Onions, raw,  cup (120 mg).  Peach, 1 each (185 mg).  Peaches, canned,  cup (120 mg).  Pears, canned,  cup (120  mg).  Peas, green, frozen,  cup (90 mg).  Peppers, green,  cup (130 mg).  Peppers, red,  cup (160 mg).  Pineapple juice,  cup (165 mg).  Pineapple, fresh or canned,  cup (100 mg).  Plums, 1 each (105 mg).  Pudding, vanilla,  cup (150 mg).  Raspberries,  cup (90 mg).  Rhubarb,  cup (  115 mg).  Rice, wild,  cup (80 mg).  Shrimp, 3 oz (155 mg).  Spinach, raw, 1 cup (170 mg).  Strawberries,  cup (125 mg).  Summer squash  cup (175-200 mg).  Swiss chard, raw, 1 cup (135 mg).  Tangerines, 1 each (140 mg).  Tea, brewed, 6 oz (65 mg).  Turnips,  cup (140 mg).  Watermelon,  cup (85 mg).  Wine, red, table, 5 oz (180 mg).  Wine, white, table, 5 oz (100 mg). LOW IN POTASSIUM The following foods and beverages have less than 50 mg of potassium per serving.  Bread, white, 1 slice (30 mg).  Carbonated beverages, 12 oz (less than 5 mg).  Cheese, 1 oz (20-30 mg).  Cranberries,  cup (45 mg).  Cranberry juice cocktail,  cup (20 mg).  Fats and oils, 1 Tbsp (less than 5 mg).  Hummus, 1 Tbsp (32 mg).  Nectar: papaya, mango, or pear,  cup (35 mg).  Rice, white or brown,  cup (50 mg).  Spaghetti or macaroni,  cup cooked (30 mg).  Tortilla, flour or corn, 1 each (50 mg).  Waffle, 4 in., 1 each (50 mg).  Water chestnuts,  cup (40 mg).   This information is not intended to replace advice given to you by your health care provider. Make sure you discuss any questions you have with your health care provider.   Document Released: 05/08/2005 Document Revised: 09/29/2013 Document Reviewed: 08/21/2013 Elsevier Interactive Patient Education [employer].

## 2016-03-14 NOTE — Progress Notes (Signed)
Subjective:    Patient ID: [name] [name], male    DOB: 05/02/1956, 60 y.o.   MRN: [id number]  HPI Comments: Mr. Mardis is a 60 year old male with PMH as below here for hospital follow-up and to re-establish care with [facility].  Recently admitted for CP and hyperkalemia.  Ruled out for ACS and hyperkalemia improved after HD.  Goes to HD at [facility] on T-Th-Sat; last HD was yesterday for full session.  No new CP since discharge.  He is scheduled to follow-up with Cardiology in two days.    Past Medical History  Diagnosis Date  . Hypertension   . Hematochezia     a. 2014: colonscopy, which showed moderately-sized internal hemorrhoids, two 41mm polyps in transverse colon and ascending colon that were resected, five 2-62mm polyps in sigmoid colon, descending colon, transverse colon, and ascending colon that were resected. An upper endoscopy was performed and showed normal esophagus, stomach, and duodenum.  . Hematuria     a. H/o hematuria 2014 with cystoscopy that was unrevealing for his source of hematuria. He underwent a kidney ultrasound on 10/14 that showed mildly echogenic and scarred kidneys compatible with medical renal disease, without hydronephrosis or renal calculi.  [name] Anemia   . CAD (coronary artery disease)     a. per [facility]where s/p 3.78mm x 83mm Vision BMS to mid LAD 12/2009 and Xience DES to mid LAD 10/2010.  . Colon polyps   . Chronic diastolic CHF (congestive heart failure) ([place])   . Hyperlipidemia   . Anginal pain ([place])   . Heart murmur   . 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 Type II diabetes mellitus ([place])   . History of blood transfusion     "had colonoscopy done; they had to give me some blood"  . Daily headache   . ESRD on dialysis [facility]) since ~ 2008    "[place]; TTS" (07/21/2015)  . On home oxygen therapy     "2L prn" (07/21/2015)  . Renal insufficiency    Current Outpatient Prescriptions on File Prior to Visit  Medication Sig Dispense  Refill  . albuterol (PROVENTIL HFA;VENTOLIN HFA) 108 (90 Base) MCG/ACT inhaler Inhale 1-2 puffs into the lungs every 6 (six) hours as needed for wheezing or shortness of breath. 1 Inhaler 0  . amitriptyline (ELAVIL) 100 MG tablet Take 1 tablet (100 mg total) by mouth at bedtime. 30 tablet 2  . amLODipine (NORVASC) 10 MG tablet Take 1 tablet (10 mg total) by mouth daily. 30 tablet 3  . aspirin 81 MG chewable tablet Chew 1 tablet (81 mg total) by mouth daily.    [name] atorvastatin (LIPITOR) 20 MG tablet Take 20 mg by mouth at bedtime.     . carvedilol (COREG) 25 MG tablet Take 1 tablet (25 mg total) by mouth 2 (two) times daily with a meal. 60 tablet 0  . cinacalcet (SENSIPAR) 90 MG tablet Take 90 mg by mouth at bedtime.     . clopidogrel (PLAVIX) 75 MG tablet Take 1 tablet (75 mg total) by mouth daily. 30 tablet 11  . hydrALAZINE (APRESOLINE) 10 MG tablet Take 1 tablet (10 mg total) by mouth every 8 (eight) hours. 90 tablet 0  . losartan (COZAAR) 100 MG tablet Take 100 mg by mouth daily.    . multivitamin (RENA-VIT) TABS tablet Take 1 tablet by mouth at bedtime. 30 tablet 0  . pantoprazole (PROTONIX) 40 MG tablet  Take 1 tablet (40 mg total) by mouth daily. 30 tablet 0  . pregabalin (LYRICA) 50 MG capsule Take 1 capsule (50 mg total) by mouth daily. 30 capsule 0  . sevelamer carbonate (RENVELA) 800 MG tablet Take 4 tablets (3,200 mg total) by mouth 3 (three) times daily with meals. 320 tablet 0  . venlafaxine XR (EFFEXOR XR) 75 MG 24 hr capsule Take 1 capsule (75 mg total) by mouth daily with breakfast. 30 capsule 2   No current facility-administered medications on file prior to visit.    Review of Systems  Constitutional: Positive for chills and unexpected weight change.  Respiratory: Positive for cough. Negative for shortness of breath and wheezing.   Cardiovascular: Positive for palpitations. Negative for chest pain.  Gastrointestinal: Positive for nausea, vomiting and abdominal pain. Negative  for diarrhea and blood in stool.       Occasional abd pain Reports 3 episodes of vomiting every week x 2  Endocrine: Positive for polyuria. Negative for polydipsia.  Genitourinary: Negative for dysuria, hematuria and difficulty urinating.  Neurological: Positive for light-headedness. Negative for syncope.       Lightheaded if stands too quickly.  Psychiatric/Behavioral: Negative for suicidal ideas and dysphoric mood.       Filed Vitals:   03/14/16 0828  BP: 145/77  Pulse: 87  Temp: 98.7 F (37.1 C)  TempSrc: Oral  Height: 6' (1.829 m)  Weight: 207 lb 6.4 oz (94.076 kg)  SpO2: 100%     Objective:   Physical Exam  Constitutional: He is oriented to person, place, and time. He appears well-developed. No distress.  HENT:  Head: Normocephalic and atraumatic.  Mouth/Throat: No oropharyngeal exudate.  Eyes: Conjunctivae and EOM are normal. Pupils are equal, round, and reactive to light. No scleral icterus.  Neck: Neck supple.  Cardiovascular: Normal rate, regular rhythm and normal heart sounds.  Exam reveals no gallop and no friction rub.   No murmur heard. LUE AV access with + thrill  Pulmonary/Chest: Effort normal and breath sounds normal. No respiratory distress. He has no wheezes. He has no rales.  Abdominal: Soft. Bowel sounds are normal. He exhibits no distension. There is no tenderness. There is no rebound and no guarding.  Musculoskeletal: Normal range of motion. He exhibits no edema or tenderness.  Neurological: He is alert and oriented to person, place, and time. No cranial nerve deficit.  Skin: Skin is warm and dry. He is not diaphoretic.  Psychiatric: He has a normal mood and affect. His behavior is normal.  Vitals reviewed.         Assessment & Plan:  Please see problem based charting for A&P.

## 2016-03-15 DIAGNOSIS — D631 Anemia in chronic kidney disease: Secondary | ICD-10-CM | POA: Diagnosis not present

## 2016-03-15 DIAGNOSIS — E119 Type 2 diabetes mellitus without complications: Secondary | ICD-10-CM | POA: Diagnosis not present

## 2016-03-15 DIAGNOSIS — N186 End stage renal disease: Secondary | ICD-10-CM | POA: Diagnosis not present

## 2016-03-15 DIAGNOSIS — N2581 Secondary hyperparathyroidism of renal origin: Secondary | ICD-10-CM | POA: Diagnosis not present

## 2016-03-15 DIAGNOSIS — Z5181 Encounter for therapeutic drug level monitoring: Secondary | ICD-10-CM | POA: Diagnosis not present

## 2016-03-15 NOTE — Assessment & Plan Note (Addendum)
Assessment:  K  7.6 at admission.  Reportedly went to HD on Thursday but did not go on Saturday due to feeling poorly. He was taken to the hospital on Saturday which would have been his normal HD day.   Plan:  Continue outpatient HD at [facility]; avoid high K foods (he was given a list); labs at HD

## 2016-03-15 NOTE — Assessment & Plan Note (Addendum)
Assessment:  Recently admitted with CP/SOB in the setting of pulm edema.  He has known CAD but ACS was r/o.  2D ECHO with stable systolic HF (EF [id number], no WMA).  No new CP since discharge.  Scheduled to follow-up with Cardiology in two days.  Last stent reportedly 7 years ago but he says he was told to remain on ASA + Plavix indefinitely.  He ran out of Plavix and Lipitor a few months ago but continues to take ASA daily. Plan: continue ASA, refill Plavix and Lipitor, follow-up with Cardiology as scheduled in two days.

## 2016-03-15 NOTE — Assessment & Plan Note (Signed)
Lab Results  Component Value Date   HGBA1C 5.6 03/14/2016   HGBA1C 6.1* 10/29/2015   HGBA1C 6.1* 06/04/2015     Assessment: Diabetes control:  well controlled Progress toward A1C goal:   at goal Comments: diet controlled  Plan: Medications:  none Other plans: follow-up next visit

## 2016-03-15 NOTE — Assessment & Plan Note (Addendum)
BP Readings from Last 3 Encounters:  03/14/16 145/77  03/12/16 146/97  03/07/16 168/90    Lab Results  Component Value Date   NA 137 03/11/2016   K 4.5 03/11/2016   CREATININE 4.89* 03/11/2016    Assessment: Blood pressure control:  fair control Progress toward BP goal:   near goal Comments: compliant with amlodipine 10mg  daily and Coreg 25mg  BID.  He has been on losartan 100mg  in the past (ran out months ago) and was prescribed hydralazine TID at discharge but has not yet picked it up from pharmacy.  Plan: Medications:  continue current medications:  Amlodipine 10mg  daily and Coreg 25mg  BID; RESUME losartan at lower dose 25mg  given recent hyperk and he has been off of this med for [employer] resources provided:   Self management tools provided:   Other plans: Would avoid TID med likely hydral as multi dosing makes compliance difficult.  RTC in 4 weeks

## 2016-03-16 ENCOUNTER — Ambulatory Visit: Payer: Medicare Other | Admitting: Cardiology

## 2016-03-16 NOTE — Progress Notes (Signed)
Internal Medicine Clinic Attending  Case discussed with Dr. Wilson soon after the resident saw the patient.  We reviewed the resident's history and exam and pertinent patient test results.  I agree with the assessment, diagnosis, and plan of care documented in the resident's note.  

## 2016-03-17 ENCOUNTER — Encounter (HOSPITAL_COMMUNITY): Payer: Self-pay

## 2016-03-17 ENCOUNTER — Other Ambulatory Visit: Payer: Self-pay

## 2016-03-17 ENCOUNTER — Emergency Department (HOSPITAL_COMMUNITY): Payer: Medicare Other

## 2016-03-17 DIAGNOSIS — J810 Acute pulmonary edema: Secondary | ICD-10-CM | POA: Diagnosis not present

## 2016-03-17 DIAGNOSIS — E1122 Type 2 diabetes mellitus with diabetic chronic kidney disease: Secondary | ICD-10-CM | POA: Diagnosis present

## 2016-03-17 DIAGNOSIS — Z8611 Personal history of tuberculosis: Secondary | ICD-10-CM

## 2016-03-17 DIAGNOSIS — Z955 Presence of coronary angioplasty implant and graft: Secondary | ICD-10-CM

## 2016-03-17 DIAGNOSIS — Z79899 Other long term (current) drug therapy: Secondary | ICD-10-CM

## 2016-03-17 DIAGNOSIS — N2581 Secondary hyperparathyroidism of renal origin: Secondary | ICD-10-CM | POA: Diagnosis not present

## 2016-03-17 DIAGNOSIS — R079 Chest pain, unspecified: Secondary | ICD-10-CM | POA: Diagnosis not present

## 2016-03-17 DIAGNOSIS — Z992 Dependence on renal dialysis: Secondary | ICD-10-CM

## 2016-03-17 DIAGNOSIS — Z7982 Long term (current) use of aspirin: Secondary | ICD-10-CM

## 2016-03-17 DIAGNOSIS — Z888 Allergy status to other drugs, medicaments and biological substances status: Secondary | ICD-10-CM

## 2016-03-17 DIAGNOSIS — N186 End stage renal disease: Secondary | ICD-10-CM | POA: Diagnosis present

## 2016-03-17 DIAGNOSIS — R0602 Shortness of breath: Secondary | ICD-10-CM | POA: Diagnosis not present

## 2016-03-17 DIAGNOSIS — Z86010 Personal history of colonic polyps: Secondary | ICD-10-CM

## 2016-03-17 DIAGNOSIS — I5032 Chronic diastolic (congestive) heart failure: Secondary | ICD-10-CM | POA: Diagnosis not present

## 2016-03-17 DIAGNOSIS — D631 Anemia in chronic kidney disease: Secondary | ICD-10-CM | POA: Diagnosis present

## 2016-03-17 DIAGNOSIS — Z9981 Dependence on supplemental oxygen: Secondary | ICD-10-CM

## 2016-03-17 DIAGNOSIS — Z833 Family history of diabetes mellitus: Secondary | ICD-10-CM

## 2016-03-17 DIAGNOSIS — I132 Hypertensive heart and chronic kidney disease with heart failure and with stage 5 chronic kidney disease, or end stage renal disease: Secondary | ICD-10-CM | POA: Diagnosis not present

## 2016-03-17 DIAGNOSIS — I2510 Atherosclerotic heart disease of native coronary artery without angina pectoris: Secondary | ICD-10-CM | POA: Diagnosis present

## 2016-03-17 DIAGNOSIS — I120 Hypertensive chronic kidney disease with stage 5 chronic kidney disease or end stage renal disease: Secondary | ICD-10-CM | POA: Diagnosis not present

## 2016-03-17 DIAGNOSIS — Z87891 Personal history of nicotine dependence: Secondary | ICD-10-CM

## 2016-03-17 DIAGNOSIS — Z8249 Family history of ischemic heart disease and other diseases of the circulatory system: Secondary | ICD-10-CM

## 2016-03-17 DIAGNOSIS — J811 Chronic pulmonary edema: Secondary | ICD-10-CM | POA: Diagnosis not present

## 2016-03-17 LAB — CBC
HEMATOCRIT: 35.8 % — AB (ref 39.0–52.0)
Hemoglobin: 10.8 g/dL — ABNORMAL LOW (ref 13.0–17.0)
MCH: 24 pg — AB (ref 26.0–34.0)
MCHC: 30.2 g/dL (ref 30.0–36.0)
MCV: 79.6 fL (ref 78.0–100.0)
Platelets: 136 10*3/uL — ABNORMAL LOW (ref 150–400)
RBC: 4.5 MIL/uL (ref 4.22–5.81)
RDW: 20.6 % — AB (ref 11.5–15.5)
WBC: 8.3 10*3/uL (ref 4.0–10.5)

## 2016-03-17 LAB — BASIC METABOLIC PANEL
Anion gap: 15 (ref 5–15)
BUN: 58 mg/dL — AB (ref 6–20)
CHLORIDE: 98 mmol/L — AB (ref 101–111)
CO2: 24 mmol/L (ref 22–32)
Calcium: 10.4 mg/dL — ABNORMAL HIGH (ref 8.9–10.3)
Creatinine, Ser: 10.11 mg/dL — ABNORMAL HIGH (ref 0.61–1.24)
GFR calc Af Amer: 6 mL/min — ABNORMAL LOW (ref >60)
GFR calc non Af Amer: 5 mL/min — ABNORMAL LOW (ref >60)
GLUCOSE: 96 mg/dL (ref 65–99)
POTASSIUM: 4.6 mmol/L (ref 3.5–5.1)
Sodium: 137 mmol/L (ref 135–145)

## 2016-03-17 LAB — I-STAT TROPONIN, ED: Troponin i, poc: 0.03 ng/mL (ref 0.00–0.08)

## 2016-03-17 NOTE — ED Notes (Signed)
Pt reports sharp, central chest pain, SOB, and nausea onset today around 2pm

## 2016-03-17 NOTE — ED Notes (Signed)
Pt placed on 2L [place]. He reports he usually wears 2L at home. Initial O2 was 88% on RA and has increased to 97% with the 2L [place].

## 2016-03-18 ENCOUNTER — Encounter (HOSPITAL_COMMUNITY): Payer: Self-pay | Admitting: *Deleted

## 2016-03-18 ENCOUNTER — Inpatient Hospital Stay (HOSPITAL_COMMUNITY)
Admission: EM | Admit: 2016-03-18 | Discharge: 2016-03-18 | DRG: 189 | Disposition: A | Discharge status: Home / Self Care | Payer: Medicare Other | Attending: Internal Medicine | Admitting: Internal Medicine

## 2016-03-18 DIAGNOSIS — I5032 Chronic diastolic (congestive) heart failure: Secondary | ICD-10-CM | POA: Diagnosis present

## 2016-03-18 DIAGNOSIS — I2510 Atherosclerotic heart disease of native coronary artery without angina pectoris: Secondary | ICD-10-CM | POA: Diagnosis present

## 2016-03-18 DIAGNOSIS — J811 Chronic pulmonary edema: Secondary | ICD-10-CM | POA: Diagnosis present

## 2016-03-18 DIAGNOSIS — Z79899 Other long term (current) drug therapy: Secondary | ICD-10-CM | POA: Diagnosis not present

## 2016-03-18 DIAGNOSIS — I10 Essential (primary) hypertension: Secondary | ICD-10-CM

## 2016-03-18 DIAGNOSIS — N2581 Secondary hyperparathyroidism of renal origin: Secondary | ICD-10-CM | POA: Diagnosis not present

## 2016-03-18 DIAGNOSIS — E1122 Type 2 diabetes mellitus with diabetic chronic kidney disease: Secondary | ICD-10-CM | POA: Diagnosis present

## 2016-03-18 DIAGNOSIS — I120 Hypertensive chronic kidney disease with stage 5 chronic kidney disease or end stage renal disease: Secondary | ICD-10-CM | POA: Diagnosis not present

## 2016-03-18 DIAGNOSIS — Z87891 Personal history of nicotine dependence: Secondary | ICD-10-CM | POA: Diagnosis not present

## 2016-03-18 DIAGNOSIS — I132 Hypertensive heart and chronic kidney disease with heart failure and with stage 5 chronic kidney disease, or end stage renal disease: Secondary | ICD-10-CM | POA: Diagnosis present

## 2016-03-18 DIAGNOSIS — Z992 Dependence on renal dialysis: Secondary | ICD-10-CM

## 2016-03-18 DIAGNOSIS — Z7982 Long term (current) use of aspirin: Secondary | ICD-10-CM | POA: Diagnosis not present

## 2016-03-18 DIAGNOSIS — E8770 Fluid overload, unspecified: Secondary | ICD-10-CM | POA: Diagnosis not present

## 2016-03-18 DIAGNOSIS — D631 Anemia in chronic kidney disease: Secondary | ICD-10-CM | POA: Diagnosis present

## 2016-03-18 DIAGNOSIS — Z955 Presence of coronary angioplasty implant and graft: Secondary | ICD-10-CM | POA: Diagnosis not present

## 2016-03-18 DIAGNOSIS — J810 Acute pulmonary edema: Principal | ICD-10-CM

## 2016-03-18 DIAGNOSIS — R079 Chest pain, unspecified: Secondary | ICD-10-CM | POA: Diagnosis not present

## 2016-03-18 DIAGNOSIS — N186 End stage renal disease: Secondary | ICD-10-CM | POA: Diagnosis not present

## 2016-03-18 DIAGNOSIS — Z8249 Family history of ischemic heart disease and other diseases of the circulatory system: Secondary | ICD-10-CM | POA: Diagnosis not present

## 2016-03-18 DIAGNOSIS — Z86010 Personal history of colonic polyps: Secondary | ICD-10-CM | POA: Diagnosis not present

## 2016-03-18 DIAGNOSIS — Z888 Allergy status to other drugs, medicaments and biological substances status: Secondary | ICD-10-CM | POA: Diagnosis not present

## 2016-03-18 DIAGNOSIS — Z833 Family history of diabetes mellitus: Secondary | ICD-10-CM | POA: Diagnosis not present

## 2016-03-18 DIAGNOSIS — Z8611 Personal history of tuberculosis: Secondary | ICD-10-CM | POA: Diagnosis not present

## 2016-03-18 DIAGNOSIS — D638 Anemia in other chronic diseases classified elsewhere: Secondary | ICD-10-CM

## 2016-03-18 DIAGNOSIS — Z9981 Dependence on supplemental oxygen: Secondary | ICD-10-CM | POA: Diagnosis not present

## 2016-03-18 LAB — GLUCOSE, CAPILLARY
GLUCOSE-CAPILLARY: 60 mg/dL — AB (ref 65–99)
GLUCOSE-CAPILLARY: 217 mg/dL — AB (ref 65–99)

## 2016-03-18 MED ORDER — HEPARIN SODIUM (PORCINE) 1000 UNIT/ML DIALYSIS
100.0000 [IU]/kg | INTRAMUSCULAR | Status: DC | PRN
Start: 2016-03-18 — End: 2016-03-18

## 2016-03-18 MED ORDER — SODIUM CHLORIDE 0.9 % IV SOLN: 100.0000 mL | INTRAVENOUS | Status: DC | PRN

## 2016-03-18 MED ORDER — AMITRIPTYLINE HCL 100 MG PO TABS: 100.0000 mg | ORAL_TABLET | Freq: Every day | ORAL | Status: DC

## 2016-03-18 MED ORDER — CARVEDILOL 25 MG PO TABS
25.0000 mg | ORAL_TABLET | Freq: Two times a day (BID) | ORAL | Status: DC
  Administered 2016-03-18: 25 mg via ORAL
  Filled 2016-03-18: qty 1

## 2016-03-18 MED ORDER — ALBUTEROL SULFATE (2.5 MG/3ML) 0.083% IN NEBU: 3.0000 mL | INHALATION_SOLUTION | Freq: Four times a day (QID) | RESPIRATORY_TRACT | Status: DC | PRN

## 2016-03-18 MED ORDER — RENA-VITE PO TABS: 1.0000 | ORAL_TABLET | Freq: Every day | ORAL | Status: DC

## 2016-03-18 MED ORDER — AMLODIPINE BESYLATE 10 MG PO TABS
10.0000 mg | ORAL_TABLET | Freq: Every day | ORAL | Status: DC
  Administered 2016-03-18: 10 mg via ORAL
  Filled 2016-03-18: qty 1

## 2016-03-18 MED ORDER — PREGABALIN 50 MG PO CAPS
50.0000 mg | ORAL_CAPSULE | Freq: Every day | ORAL | Status: DC
  Administered 2016-03-18: 50 mg via ORAL
  Filled 2016-03-18: qty 1

## 2016-03-18 MED ORDER — LIDOCAINE HCL (PF) 1 % IJ SOLN: 5.0000 mL | INTRAMUSCULAR | Status: DC | PRN

## 2016-03-18 MED ORDER — ONDANSETRON HCL 4 MG/2ML IJ SOLN: 4.0000 mg | Freq: Four times a day (QID) | INTRAMUSCULAR | Status: DC | PRN

## 2016-03-18 MED ORDER — HYDRALAZINE HCL 25 MG PO TABS: 25.0000 mg | ORAL_TABLET | Freq: Three times a day (TID) | ORAL | Status: DC

## 2016-03-18 MED ORDER — ACETAMINOPHEN 325 MG PO TABS: 650.0000 mg | ORAL_TABLET | ORAL | Status: DC | PRN

## 2016-03-18 MED ORDER — VENLAFAXINE HCL ER 75 MG PO CP24
75.0000 mg | ORAL_CAPSULE | Freq: Every day | ORAL | Status: DC
  Administered 2016-03-18: 75 mg via ORAL
  Filled 2016-03-18: qty 1

## 2016-03-18 MED ORDER — LIDOCAINE-PRILOCAINE 2.5-2.5 % EX CREA: 1.0000 "application " | TOPICAL_CREAM | CUTANEOUS | Status: DC | PRN

## 2016-03-18 MED ORDER — HEPARIN SODIUM (PORCINE) 1000 UNIT/ML DIALYSIS: 1000.0000 [IU] | INTRAMUSCULAR | Status: DC | PRN

## 2016-03-18 MED ORDER — CINACALCET HCL 30 MG PO TABS: 90.0000 mg | ORAL_TABLET | Freq: Every day | ORAL | Status: DC

## 2016-03-18 MED ORDER — PENTAFLUOROPROP-TETRAFLUOROETH EX AERO: 1.0000 "application " | INHALATION_SPRAY | CUTANEOUS | Status: DC | PRN

## 2016-03-18 MED ORDER — ALTEPLASE 2 MG IJ SOLR: 2.0000 mg | Freq: Once | INTRAMUSCULAR | Status: DC | PRN

## 2016-03-18 MED ORDER — ASPIRIN 81 MG PO CHEW
81.0000 mg | CHEWABLE_TABLET | Freq: Every day | ORAL | Status: DC
  Administered 2016-03-18: 81 mg via ORAL
  Filled 2016-03-18: qty 1

## 2016-03-18 MED ORDER — SEVELAMER CARBONATE 800 MG PO TABS
3200.0000 mg | ORAL_TABLET | Freq: Three times a day (TID) | ORAL | Status: DC
  Administered 2016-03-18 (×2): 3200 mg via ORAL
  Filled 2016-03-18 (×2): qty 4

## 2016-03-18 MED ORDER — HYDRALAZINE HCL 20 MG/ML IJ SOLN: 10.0000 mg | INTRAMUSCULAR | Status: DC | PRN

## 2016-03-18 MED ORDER — DARBEPOETIN ALFA 40 MCG/0.4ML IJ SOSY: 40.0000 ug | PREFILLED_SYRINGE | INTRAMUSCULAR | Status: DC

## 2016-03-18 MED ORDER — MORPHINE SULFATE (PF) 2 MG/ML IV SOLN: 2.0000 mg | INTRAVENOUS | Status: DC | PRN

## 2016-03-18 MED ORDER — ATORVASTATIN CALCIUM 20 MG PO TABS: 20.0000 mg | ORAL_TABLET | Freq: Every day | ORAL | Status: DC

## 2016-03-18 MED ORDER — ENOXAPARIN SODIUM 30 MG/0.3ML ~~LOC~~ SOLN
30.0000 mg | Freq: Every day | SUBCUTANEOUS | Status: DC
  Filled 2016-03-18: qty 0.3

## 2016-03-18 MED ORDER — LOSARTAN POTASSIUM 50 MG PO TABS: 100.0000 mg | ORAL_TABLET | Freq: Every day | ORAL | Status: DC

## 2016-03-18 NOTE — Progress Notes (Signed)
Patient in dialysis talked to ED RN.

## 2016-03-18 NOTE — Progress Notes (Signed)
Patient placed in wheelchair and taken to ED pick-up area for taxi to take patient home.  Loaner oxygen tank (provided by [place]), sent with patient.  Jillyn Ledger, MBA, BSN, RN

## 2016-03-18 NOTE — ED Notes (Signed)
Pt taken to dialysis 

## 2016-03-18 NOTE — Progress Notes (Signed)
Patient discharge teaching given, including activity, diet, follow-up appoints, and medications. Patient verbalized understanding of all discharge instructions. IV access was d/c'd. Vitals are stable. Skin is intact except as charted in most recent assessments. Pt to be escorted out by NT, to be driven home by taxi.  Loaner oxygen tank to be provided by [place].  Jillyn Ledger, MBA, BSN, RN

## 2016-03-18 NOTE — Care Management Note (Signed)
Case Management Note  Patient Details  Name: Tannon Mirro MRN: [id number] Date of Birth: 29-Nov-1955  Subjective/Objective:        Patient presented to ED with SOB after meeting dialysis on Saturday            Action/Plan: CM consulted by Jonni Sanger RN concerning patient being discharged . Patient on home oxygen but does not have access to a tank or anyone to bring a tank to the hospital. Patient oxygen is being supplied by [facility]. CM spoke with [facility] to confirm delivery of a loaner tank. [place] will deliver tank to room prior to discharge today. Patient also need transportation assistance unable to find a ride. CSW consulted as well.  Expected Discharge Date:       03/18/16           Expected Discharge Plan:  Home/Self Care  In-House Referral:     Discharge planning Services  CM Consult  Post Acute Care Choice:    Choice offered to:  Patient  DME Arranged:  Oxygen DME Agency:  [place] NA  HH Arranged:    [place] Agency:  NA  Status of Service:  Completed, signed off  Medicare Important Message Given:    Date Medicare IM Given:    Medicare IM give by:    Date Additional Medicare IM Given:    Additional Medicare Important Message give by:     If discussed at [place] of Stay Meetings, dates discussed:    Additional CommentsLaurena Slimmer, RN 03/18/2016, 4:43 PM

## 2016-03-18 NOTE — Clinical Social Work Note (Signed)
Patient's family is unable to provided transportation for patient. Patient is on oxygen. CSW provided [facility] Voucher for patient. CSW signing off.  [employer], LCSW [phone]

## 2016-03-18 NOTE — Procedures (Signed)
Pt seen on HD.  Ap 120 Vp 190  BFR 400.  Pulling 4L.  Some mild cramping.  BP still high, has not taken BP meds.

## 2016-03-18 NOTE — H&P (Addendum)
History and Physical    [name] [name] [id number] DOB: 04-12-1956 DOA: 03/18/2016  Referring MD/NP/PA: Dr. Roxanne Mins PCP: [name]  Patient coming from: Home  Chief Complaint: Chest pain  HPI: [name] [name] is a 60 y.o. male with medical history significant of ESRD on HD (T/Th/Sat at [facility]), DM 2, CHF, CAD, HTN, and HLD; who presents with complaints of chest pain. Patient reported stabbing chest pain substernal. Patient notes that this is the same pain that he always gets. He reports missing dialysis Saturday secondary to going to his son's graduation. Associated symptoms included some nausea and shortness of breath. Patient denies striking anything except for coming to the emergency department to relieve symptoms.   ED Course: Upon admission patient was evaluated and seen to be afebrile,  heart rates up to 115, respirations up to 30, blood pressure of 2 209/114, O2 saturations maintained on nasal cannula oxygen. Lab work revealed hemoglobin 10.8, platelets 137, BUN 58, creatinine 10.11, calcium 10.4. Dr. Mercy Moore of nephrology consulted in the ED and recommended patient receiving hemodialysis in a.m. and TRH to admit the patient.   Review of Systems: As per HPI otherwise 10 point review of systems negative.   Past Medical History  Diagnosis Date  . Hypertension   . Hematochezia     a. 2014: colonscopy, which showed moderately-sized internal hemorrhoids, two 68mm polyps in transverse colon and ascending colon that were resected, five 2-69mm polyps in sigmoid colon, descending colon, transverse colon, and ascending colon that were resected. An upper endoscopy was performed and showed normal esophagus, stomach, and duodenum.  . Hematuria     a. H/o hematuria 2014 with cystoscopy that was unrevealing for his source of hematuria. He underwent a kidney ultrasound on 10/14 that showed mildly echogenic and scarred kidneys compatible with medical renal disease, without  hydronephrosis or renal calculi.  [name] Anemia   . CAD (coronary artery disease)     a. per [facility] s/p 3.15mm x 7mm Vision BMS to mid LAD 12/2009 and Xience DES to mid LAD 10/2010.  . Colon polyps   . Chronic diastolic CHF (congestive heart failure) ([place])   . Hyperlipidemia   . Anginal pain ([place])   . Heart murmur   . 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 Type II diabetes mellitus ([place])   . History of blood transfusion     "had colonoscopy done; they had to give me some blood"  . Daily headache   . ESRD on dialysis [facility]) since ~ 2008    [place]" (07/21/2015)  . On home oxygen therapy     "2L prn" (07/21/2015)  . Renal insufficiency     Past Surgical History  Procedure Laterality Date  . Left heart catheterization with coronary angiogram N/A 11/23/2014    Procedure: LEFT HEART CATHETERIZATION WITH CORONARY ANGIOGRAM;  Surgeon: Troy Sine, MD;  Location: [facility] CATH LAB;  Service: Cardiovascular;  Laterality: N/A;  . Lithotripsy  X1  . Cystoscopy w/ stone manipulation  X2?  . Cardiac catheterization  "several"  . Coronary angioplasty with stent placement  "several"  . Eye surgery Bilateral     "laser OR for hemorrhage"  . Av fistula placement Left ~ 2007    "upper arm"     reports that he quit smoking about 5 years ago. His smoking use included Cigarettes. He has a 4 pack-year smoking history. He has never used smokeless  tobacco. He reports that he does not drink alcohol or use illicit drugs.  Allergies  Allergen Reactions  . Enalapril Hives    Family History  Problem Relation Age of Onset  . Hypertension    . Bone cancer Mother   . Anuerysm Father   . Diabetes type II Daughter     Prior to Admission medications   Medication Sig Start Date End Date Taking? Authorizing Provider  albuterol (PROVENTIL HFA;VENTOLIN HFA) 108 (90 Base) MCG/ACT inhaler Inhale 1-2 puffs into the lungs every 6 (six) hours as needed for wheezing or  shortness of breath. 01/19/16  Yes Delsa Grana, PA-C  amitriptyline (ELAVIL) 100 MG tablet Take 1 tablet (100 mg total) by mouth at bedtime. 07/27/15  Yes Shela Leff, MD  amLODipine (NORVASC) 10 MG tablet Take 1 tablet (10 mg total) by mouth daily. 10/30/15  Yes Ripudeep Krystal Eaton, MD  aspirin 81 MG chewable tablet Chew 1 tablet (81 mg total) by mouth daily. 07/22/15  Yes Iline Oven, MD  atorvastatin (LIPITOR) 20 MG tablet Take 20 mg by mouth at bedtime. Reported on 03/14/2016   Yes Historical Provider, MD  carvedilol (COREG) 25 MG tablet Take 1 tablet (25 mg total) by mouth 2 (two) times daily with a meal. 03/12/16  Yes Geradine Girt, DO  cinacalcet (SENSIPAR) 90 MG tablet Take 90 mg by mouth at bedtime.    Yes Historical Provider, MD  losartan (COZAAR) 100 MG tablet Take 100 mg by mouth at bedtime.   Yes Historical Provider, MD  multivitamin (RENA-VIT) TABS tablet Take 1 tablet by mouth at bedtime. 12/15/14  Yes Geradine Girt, DO  pregabalin (LYRICA) 50 MG capsule Take 1 capsule (50 mg total) by mouth daily. 12/15/14  Yes Geradine Girt, DO  sevelamer carbonate (RENVELA) 800 MG tablet Take 4 tablets (3,200 mg total) by mouth 3 (three) times daily with meals. 03/12/16  Yes Geradine Girt, DO  venlafaxine XR (EFFEXOR XR) 75 MG 24 hr capsule Take 1 capsule (75 mg total) by mouth daily with breakfast. 07/27/15  Yes Shela Leff, MD    Physical Exam: Filed Vitals:   03/18/16 0245 03/18/16 0322 03/18/16 0327 03/18/16 0400  BP: 185/113 180/111 198/117 190/114  Pulse: 112 110 114 111  Temp:  98 F (36.7 C)    TempSrc:  Oral    Resp: 21 28 22 24   Weight:  92.7 kg (204 lb 5.9 oz)    SpO2: 92% 95% 96% 96%      Constitutional: Alert and oriented male in  [place] Vitals:   03/18/16 0245 03/18/16 0322 03/18/16 0327 03/18/16 0400  BP: 185/113 180/111 198/117 190/114  Pulse: 112 110 114 111  Temp:  98 F (36.7 C)    TempSrc:  Oral    Resp: 21 28 22 24   Weight:  92.7 kg (204 lb 5.9 oz)      SpO2: 92% 95% 96% 96%   Eyes: PERRL, lids and conjunctivae normal ENMT: Mucous membranes are moist. Posterior pharynx clear of any exudate or lesions.Normal dentition.  Neck: normal, supple, no masses, no thyromegaly Respiratory: Tachypneic with bilateral crackles appreciated  Cardiovascular: Regular rate and rhythm, no murmurs / rubs / gallops.+1 pitting edema of the bilateral lower extremities . 2+ pedal pulses. No carotid bruits. AV fistula present in the left upper arm  Abdomen: no tenderness, no masses palpated. No hepatosplenomegaly. Bowel sounds positive.  Musculoskeletal: no clubbing / cyanosis. No joint deformity upper and lower extremities. Good ROM,  no contractures. Normal muscle tone.  Skin: no rashes, lesions, ulcers. No induration Neurologic: CN 2-12 grossly intact. Sensation intact, DTR normal. Strength 5/5 in all 4.  Psychiatric: Normal judgment and insight. Alert and oriented x 3. Normal mood.     Labs on Admission: I have personally reviewed following labs and imaging studies  CBC:  Recent Labs Lab 03/17/16 2133  WBC 8.3  HGB 10.8*  HCT 35.8*  MCV 79.6  PLT [id number]*   Basic Metabolic Panel:  Recent Labs Lab 03/17/16 2133  NA 137  K 4.6  CL 98*  CO2 24  GLUCOSE 96  BUN 58*  CREATININE 10.11*  CALCIUM 10.4*   GFR: Estimated Creatinine Clearance: 8.7 mL/min (by C-G formula based on Cr of 10.11). Liver Function Tests: No results for input(s): AST, ALT, ALKPHOS, BILITOT, PROT, ALBUMIN in the last 168 hours. No results for input(s): LIPASE, AMYLASE in the last 168 hours. No results for input(s): AMMONIA in the last 168 hours. Coagulation Profile: No results for input(s): INR, PROTIME in the last 168 hours. Cardiac Enzymes:  Recent Labs Lab 03/12/16 0938 03/12/16 1325  TROPONINI <0.03 <0.03   BNP (last 3 results) No results for input(s): PROBNP in the last 8760 hours. HbA1C: No results for input(s): HGBA1C in the last 72 hours. CBG:  Recent  Labs Lab 03/11/16 2123 03/12/16 0728 03/12/16 1121 03/14/16 0931  GLUCAP 130* 101* 129* 116*   Lipid Profile: No results for input(s): CHOL, HDL, LDLCALC, TRIG, CHOLHDL, LDLDIRECT in the last 72 hours. Thyroid Function Tests: No results for input(s): TSH, T4TOTAL, FREET4, T3FREE, THYROIDAB in the last 72 hours. Anemia Panel: No results for input(s): VITAMINB12, FOLATE, FERRITIN, TIBC, IRON, RETICCTPCT in the last 72 hours. Urine analysis:    Component Value Date/Time   COLORURINE YELLOW 12/23/2015 1332   APPEARANCEUR CLEAR 12/23/2015 1332   LABSPEC 1.014 12/23/2015 1332   PHURINE 8.0 12/23/2015 1332   GLUCOSEU 100* 12/23/2015 1332   HGBUR MODERATE* 12/23/2015 1332   BILIRUBINUR NEGATIVE 12/23/2015 1332   KETONESUR NEGATIVE 12/23/2015 1332   PROTEINUR >300* 12/23/2015 1332   UROBILINOGEN 0.2 02/22/2015 2343   NITRITE NEGATIVE 12/23/2015 1332   LEUKOCYTESUR NEGATIVE 12/23/2015 1332   Sepsis Labs: No results found for this or any previous visit (from the past 240 hour(s)).   Radiological Exams on Admission: Dg Chest 2 View  03/17/2016  CLINICAL DATA:  Acute onset of central chest pain and nausea. Shortness of breath. Generalized weakness. Initial encounter. EXAM: CHEST  2 VIEW COMPARISON:  Chest radiograph performed 03/11/2016 FINDINGS: The lungs are well-aerated. There is mild elevation of the left hemidiaphragm. Vascular congestion is noted. Patchy bilateral airspace opacity, with sparing at the upper lung zones, may reflect pneumonia or pulmonary edema. No pleural effusion or pneumothorax is seen. The heart is borderline normal in size. No acute osseous abnormalities are seen. IMPRESSION: Vascular congestion noted. Patchy bilateral airspace opacity, with sparing at the upper lung zones, may reflect pneumonia or pulmonary edema. Electronically Signed   By: Garald Balding M.D.   On: 03/17/2016 21:40    EKG: Independently reviewed.Sinus tachycardia  Assessment/Plan Fluid  overload/ End stage renal disease/ missed HD: acute. patient with pulmonary edema seen on chest x-ray - Admitted to stepdown - Continuous pulse oximetry with nasal cannula oxygen to keep O2 sats greater than 90% - per Nephrology Dr. Mercy Moore patient to be dialyzed in a.m. - Continue home meds  Chest pain: Acute on chronic. Troponins negative and EKG showing sinus tachycardia. -  Trend cardiac enzymes - Recheck EKG in a.m. - Follow-up telemetry  Accelerated hypertension - Continue home meds - Hydralazine IV prn  Anemia of chronic kidney disease:hemoglobin 10.8  - Continue to monitor  Hypercalcemia: Calcium 10.4  - Per nephrology    DVT prophylaxis: heparin Code Status:  full Family Communication: none Disposition Plan: Possible discharge home and 1-2 days Consults called: Nephrology Admission status: inpatient stepdown  Norval Morton MD [facility] Pager [phone]  If 7PM-7AM, please contact night-coverage [email or web address] Password [email or web address]  03/18/2016, 4:09 AM

## 2016-03-18 NOTE — ED Notes (Signed)
Pt remains in dialysis, per chart. However, report given to floor nurse, Joette Catching by this RN.

## 2016-03-18 NOTE — ED Provider Notes (Signed)
CSN: [id number]     Arrival date & time 03/17/16  2054 History   By signing my name below, I, Altamease Oiler, attest that this documentation has been prepared under the direction and in the presence of Delora Fuel, MD. Electronically Signed: Altamease Oiler, ED Scribe. 03/18/2016. 2:29 AM   Chief Complaint  Patient presents with  . Chest Pain   The history is provided by the patient. No language interpreter was used.   [name] [name] is a 60 y.o. male with PMHx of ESRD on dialysis, CHF, CAD, HTN, DM, and HLD who presents to the Emergency Department complaining of stabbing, 5-6/10 in severity, mid-sternal chest pain with onset yesterday. Pt states that he missed dialysis yesterday to attend his son's graduation. Associated symptoms include SOB and nausea. He gets dialysis at the [facility].   Past Medical History  Diagnosis Date  . Hypertension   . Hematochezia     a. 2014: colonscopy, which showed moderately-sized internal hemorrhoids, two 41mm polyps in transverse colon and ascending colon that were resected, five 2-81mm polyps in sigmoid colon, descending colon, transverse colon, and ascending colon that were resected. An upper endoscopy was performed and showed normal esophagus, stomach, and duodenum.  . Hematuria     a. H/o hematuria 2014 with cystoscopy that was unrevealing for his source of hematuria. He underwent a kidney ultrasound on 10/14 that showed mildly echogenic and scarred kidneys compatible with medical renal disease, without hydronephrosis or renal calculi.  [name] Anemia   . CAD (coronary artery disease)     a. per [facility] s/p 3.25mm x 21mm Vision BMS to mid LAD 12/2009 and Xience DES to mid LAD 10/2010.  . Colon polyps   . Chronic diastolic CHF (congestive heart failure) ([place])   . Hyperlipidemia   . Anginal pain ([place])   . Heart murmur   . 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 Type II diabetes mellitus ([place])   . History of  blood transfusion     "had colonoscopy done; they had to give me some blood"  . Daily headache   . ESRD on dialysis [facility]) since ~ 2008    "[place]; TTS" (07/21/2015)  . On home oxygen therapy     "2L prn" (07/21/2015)  . Renal insufficiency    Past Surgical History  Procedure Laterality Date  . Left heart catheterization with coronary angiogram N/A 11/23/2014    Procedure: LEFT HEART CATHETERIZATION WITH CORONARY ANGIOGRAM;  Surgeon: Troy Sine, MD;  Location: [facility] CATH LAB;  Service: Cardiovascular;  Laterality: N/A;  . Lithotripsy  X1  . Cystoscopy w/ stone manipulation  X2?  . Cardiac catheterization  "several"  . Coronary angioplasty with stent placement  "several"  . Eye surgery Bilateral     "laser OR for hemorrhage"  . Av fistula placement Left ~ 2007    "upper arm"   Family History  Problem Relation Age of Onset  . Hypertension    . Bone cancer Mother   . Anuerysm Father   . Diabetes type II Daughter    Social History  Substance Use Topics  . Smoking status: Former Smoker -- 0.50 packs/day for 8 years    Types: Cigarettes    Quit date: 12/06/2010  . Smokeless tobacco: Never Used  . Alcohol Use: No    Review of Systems  Respiratory: Positive for shortness of breath.   Cardiovascular: Positive for chest pain.  Gastrointestinal: Positive for nausea.  All other systems reviewed and are negative.   Allergies  Enalapril  Home Medications   Prior to Admission medications   Medication Sig Start Date End Date Taking? Authorizing Provider  albuterol (PROVENTIL HFA;VENTOLIN HFA) 108 (90 Base) MCG/ACT inhaler Inhale 1-2 puffs into the lungs every 6 (six) hours as needed for wheezing or shortness of breath. 01/19/16   Delsa Grana, PA-C  amitriptyline (ELAVIL) 100 MG tablet Take 1 tablet (100 mg total) by mouth at bedtime. 07/27/15   Shela Leff, MD  amLODipine (NORVASC) 10 MG tablet Take 1 tablet (10 mg total) by mouth daily. 10/30/15   Ripudeep Krystal Eaton, MD   aspirin 81 MG chewable tablet Chew 1 tablet (81 mg total) by mouth daily. 07/22/15   Iline Oven, MD  atorvastatin (LIPITOR) 20 MG tablet Take 20 mg by mouth at bedtime. Reported on 03/14/2016    Historical Provider, MD  carvedilol (COREG) 25 MG tablet Take 1 tablet (25 mg total) by mouth 2 (two) times daily with a meal. 03/12/16   Geradine Girt, DO  cinacalcet (SENSIPAR) 90 MG tablet Take 90 mg by mouth at bedtime.     Historical Provider, MD  clopidogrel (PLAVIX) 75 MG tablet Take 1 tablet (75 mg total) by mouth daily. Patient not taking: Reported on 03/14/2016 11/24/14   Juluis Mire, MD  multivitamin (RENA-VIT) TABS tablet Take 1 tablet by mouth at bedtime. Patient not taking: Reported on 03/14/2016 12/15/14   Geradine Girt, DO  pantoprazole (PROTONIX) 40 MG tablet Take 1 tablet (40 mg total) by mouth daily. Patient not taking: Reported on 03/14/2016 06/05/15   Janece Canterbury, MD  pregabalin (LYRICA) 50 MG capsule Take 1 capsule (50 mg total) by mouth daily. 12/15/14   Geradine Girt, DO  sevelamer carbonate (RENVELA) 800 MG tablet Take 4 tablets (3,200 mg total) by mouth 3 (three) times daily with meals. 03/12/16   Geradine Girt, DO  venlafaxine XR (EFFEXOR XR) 75 MG 24 hr capsule Take 1 capsule (75 mg total) by mouth daily with breakfast. Patient not taking: Reported on 03/14/2016 07/27/15   Shela Leff, MD   BP 209/114 mmHg  Pulse 109  Temp(Src) 98 F (36.7 C) (Oral)  Resp 22  SpO2 99% Physical Exam  Constitutional: He is oriented to person, place, and time. He appears well-developed and well-nourished.  Appears dyspneic   HENT:  Head: Normocephalic and atraumatic.  Eyes: EOM are normal. Pupils are equal, round, and reactive to light.  Neck: Normal range of motion. Neck supple. No JVD present.  Cardiovascular: Normal rate, regular rhythm, normal heart sounds and intact distal pulses.   No murmur heard. Pulmonary/Chest: Effort normal. He has no wheezes. He has rales (bibasilar).  He exhibits no tenderness.  Abdominal: Soft. Bowel sounds are normal. He exhibits no distension and no mass. There is no tenderness.  Musculoskeletal: Normal range of motion. He exhibits no edema.  1+ pitting edema bilaterally  AV fistula present st the left upper arm with thrill present   Lymphadenopathy:    He has no cervical adenopathy.  Neurological: He is alert and oriented to person, place, and time. No cranial nerve deficit. He exhibits normal muscle tone. Coordination normal.  Skin: Skin is warm and dry. No rash noted.  Psychiatric: He has a normal mood and affect. His behavior is normal. Judgment and thought content normal.  Nursing note and vitals reviewed.   ED Course  Procedures (including critical care time)  DIAGNOSTIC STUDIES: Oxygen Saturation is 99% on 2L, adequate by my interpretation.    COORDINATION OF CARE: 1:41 AM Discussed treatment plan which includes lab work, CXR, EKG, and Nephrology consult with pt at bedside and pt agreed to plan.  1:46 AM-Consult complete with Dr. Mattingly(Nephrology). Patient case explained and discussed. Requests pt be admitted to hospitalist service and he will dialyze in the morning.  Call ended at 1:47 AM  2:28 AM-Consult complete with Dr. Tamala Julian [facility]). Patient case explained and discussed. Agrees to admit patient for further evaluation and treatment. Call ended at 2:28 AM   Labs Review Labs Reviewed  BASIC METABOLIC PANEL - Abnormal; Notable for the following:    Chloride 98 (*)    BUN 58 (*)    Creatinine, Ser 10.11 (*)    Calcium 10.4 (*)    GFR calc non Af Amer 5 (*)    GFR calc Af Amer 6 (*)    All other components within normal limits  CBC - Abnormal; Notable for the following:    Hemoglobin 10.8 (*)    HCT 35.8 (*)    MCH 24.0 (*)    RDW 20.6 (*)    Platelets 136 (*)    All other components within normal limits  I-STAT TROPOININ, ED    Imaging Review Dg Chest 2 View  03/17/2016  CLINICAL DATA:  Acute  onset of central chest pain and nausea. Shortness of breath. Generalized weakness. Initial encounter. EXAM: CHEST  2 VIEW COMPARISON:  Chest radiograph performed 03/11/2016 FINDINGS: The lungs are well-aerated. There is mild elevation of the left hemidiaphragm. Vascular congestion is noted. Patchy bilateral airspace opacity, with sparing at the upper lung zones, may reflect pneumonia or pulmonary edema. No pleural effusion or pneumothorax is seen. The heart is borderline normal in size. No acute osseous abnormalities are seen. IMPRESSION: Vascular congestion noted. Patchy bilateral airspace opacity, with sparing at the upper lung zones, may reflect pneumonia or pulmonary edema. Electronically Signed   By: Garald Balding M.D.   On: 03/17/2016 21:40   I have personally reviewed and evaluated these images and lab results as part of my medical decision-making.   EKG Interpretation   Date/Time:  Saturday March 17 2016 20:58:49 EDT Ventricular Rate:  115 PR Interval:  136 QRS Duration: 98 QT Interval:  340 QTC Calculation: 470 R Axis:   95 Text Interpretation:  ** Poor data quality, interpretation may be  adversely affected Sinus tachycardia Possible Left atrial enlargement  Rightward axis Borderline ECG When compared with ECG of 03/12/2016, No  significant change was found Confirmed by [facility]  MD, Madilyne Tadlock ([id number]) on  03/18/2016 1:16:42 AM      CRITICAL CARE Performed by: [id number] Total critical care time: 45 minutes Critical care time was exclusive of separately billable procedures and treating other patients. Critical care was necessary to treat or prevent imminent or life-threatening deterioration. Critical care was time spent personally by me on the following activities: development of treatment plan with patient and/or surrogate as well as nursing, discussions with consultants, evaluation of patient's response to treatment, examination of patient, obtaining history from patient or surrogate,  ordering and performing treatments and interventions, ordering and review of laboratory studies, ordering and review of radiographic studies, pulse oximetry and re-evaluation of patient's condition.  MDM   Final diagnoses:  Acute pulmonary edema (HCC)  End-stage renal disease on hemodialysis ([place])    Pulmonary edema secondary to missing dialysis. Although dyspneic, patient is maintaining adequate oxygen saturation and  is not using accessory muscles of respiration. He will need to be dialyzed. Case is discussed with Dr. Mercy Moore who requests he be admitted to the hospitalists service for dialysis first thing in the morning. Case is discussed with Dr. Tamala Julian of [facility] who agrees to admit the patient under observation status.  I personally performed the services described in this documentation, which was scribed in my presence. The recorded information has been reviewed and is accurate.      Delora Fuel, MD [id number]

## 2016-03-18 NOTE — Consult Note (Signed)
[name] [name] is an 60 y.o. male referred by Dr Tamala Julian   Chief Complaint: SOB, Pulm edema HPI: 60yo BM with ESRD and several admissions for pulm edema presents to ER with SOB after missing his HD session on Sat.  He says he "had" to go to a graduation.  CXR shows infiltrates most likely pulm edema.  Just DC for hospitalization for missing HD last week.  Denies CP.  Past Medical History  Diagnosis Date  . Hypertension   . Hematochezia     a. 2014: colonscopy, which showed moderately-sized internal hemorrhoids, two 64mm polyps in transverse colon and ascending colon that were resected, five 2-40mm polyps in sigmoid colon, descending colon, transverse colon, and ascending colon that were resected. An upper endoscopy was performed and showed normal esophagus, stomach, and duodenum.  . Hematuria     a. H/o hematuria 2014 with cystoscopy that was unrevealing for his source of hematuria. He underwent a kidney ultrasound on 10/14 that showed mildly echogenic and scarred kidneys compatible with medical renal disease, without hydronephrosis or renal calculi.  [name] Anemia   . CAD (coronary artery disease)     a. per [facility] s/p 3.94mm x 38mm Vision BMS to mid LAD 12/2009 and Xience DES to mid LAD 10/2010.  . Colon polyps   . Chronic diastolic CHF (congestive heart failure) ([place])   . Hyperlipidemia   . Anginal pain ([place])   . Heart murmur   . 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 Type II diabetes mellitus ([place])   . History of blood transfusion     "had colonoscopy done; they had to give me some blood"  . Daily headache   . ESRD on dialysis [facility]) since ~ 2008    "[place]; TTS" (07/21/2015)  . On home oxygen therapy     "2L prn" (07/21/2015)  . Renal insufficiency     Past Surgical History  Procedure Laterality Date  . Left heart catheterization with coronary angiogram N/A 11/23/2014    Procedure: LEFT HEART CATHETERIZATION WITH CORONARY ANGIOGRAM;  Surgeon: Troy Sine, MD;  Location: [facility] CATH LAB;  Service: Cardiovascular;  Laterality: N/A;  . Lithotripsy  X1  . Cystoscopy w/ stone manipulation  X2?  . Cardiac catheterization  "several"  . Coronary angioplasty with stent placement  "several"  . Eye surgery Bilateral     "laser OR for hemorrhage"  . Av fistula placement Left ~ 2007    "upper arm"    Family History  Problem Relation Age of Onset  . Hypertension    . Bone cancer Mother   . Anuerysm Father   . Diabetes type II Daughter   FH neg for kidney Ds  Social History:  reports that he quit smoking about 5 years ago. His smoking use included Cigarettes. He has a 4 pack-year smoking history. He has never used smokeless tobacco. He reports that he does not drink alcohol or use illicit drugs. Divorced, Lives with son  Allergies:  Allergies  Allergen Reactions  . Enalapril Hives    Medications Prior to Admission  Medication Sig Dispense Refill  . albuterol (PROVENTIL HFA;VENTOLIN HFA) 108 (90 Base) MCG/ACT inhaler Inhale 1-2 puffs into the lungs every 6 (six) hours as needed for wheezing or shortness of breath. 1 Inhaler 0  . amitriptyline (ELAVIL) 100 MG tablet Take 1 tablet (100 mg total) by mouth at bedtime. 30 tablet 2  . amLODipine (NORVASC) 10  MG tablet Take 1 tablet (10 mg total) by mouth daily. 30 tablet 3  . aspirin 81 MG chewable tablet Chew 1 tablet (81 mg total) by mouth daily.    [name] atorvastatin (LIPITOR) 20 MG tablet Take 20 mg by mouth at bedtime. Reported on 03/14/2016    . carvedilol (COREG) 25 MG tablet Take 1 tablet (25 mg total) by mouth 2 (two) times daily with a meal. 60 tablet 0  . cinacalcet (SENSIPAR) 90 MG tablet Take 90 mg by mouth at bedtime.     [name] losartan (COZAAR) 100 MG tablet Take 100 mg by mouth at bedtime.    . multivitamin (RENA-VIT) TABS tablet Take 1 tablet by mouth at bedtime. 30 tablet 0  . pregabalin (LYRICA) 50 MG capsule Take 1 capsule (50 mg total) by mouth daily. 30 capsule 0  . sevelamer carbonate  (RENVELA) 800 MG tablet Take 4 tablets (3,200 mg total) by mouth 3 (three) times daily with meals. 320 tablet 0  . venlafaxine XR (EFFEXOR XR) 75 MG 24 hr capsule Take 1 capsule (75 mg total) by mouth daily with breakfast. 30 capsule 2     Lab Results: UA: ND   Recent Labs  03/17/16 2133  WBC 8.3  HGB 10.8*  HCT 35.8*  PLT 136*   BMET  Recent Labs  03/17/16 2133  NA 137  K 4.6  CL 98*  CO2 24  GLUCOSE 96  BUN 58*  CREATININE 10.11*  CALCIUM 10.4*   LFT No results for input(s): PROT, ALBUMIN, AST, ALT, ALKPHOS, BILITOT, BILIDIR, IBILI in the last 72 hours. Dg Chest 2 View  03/17/2016  CLINICAL DATA:  Acute onset of central chest pain and nausea. Shortness of breath. Generalized weakness. Initial encounter. EXAM: CHEST  2 VIEW COMPARISON:  Chest radiograph performed 03/11/2016 FINDINGS: The lungs are well-aerated. There is mild elevation of the left hemidiaphragm. Vascular congestion is noted. Patchy bilateral airspace opacity, with sparing at the upper lung zones, may reflect pneumonia or pulmonary edema. No pleural effusion or pneumothorax is seen. The heart is borderline normal in size. No acute osseous abnormalities are seen. IMPRESSION: Vascular congestion noted. Patchy bilateral airspace opacity, with sparing at the upper lung zones, may reflect pneumonia or pulmonary edema. Electronically Signed   By: Garald Balding M.D.   On: 03/17/2016 21:40    ROS: Appetite good + SOB No CP No abd pain No dysuria No new arthritic CO  PHYSICAL EXAM: Blood pressure 165/102, pulse 108, temperature 98 F (36.7 C), temperature source Oral, resp. rate 21, weight 92.7 kg (204 lb 5.9 oz), SpO2 96 %. HEENT: PERRLA EOMI NECK:No JVD LUNGS:Bil basilar crackles CARDIAC:RRR wo MRG ABD:+ BS NTND EXT:tr edema  LUA AVF + bruit NEURO:CNI Ox3 no asterixis  Assessment: 1. Pulm edema sec ESRD and missed HD  2. ESRD 3. Anemia 4. Sec HPTH on sensipar 5. HTN PLAN: 1. Emergent  HD 2. Discussed the need for him to show up for HD and if he is to miss a session to schedule a makeup session 3. Resume aranesp and sensipar. Hold Vit D as Ca sl high 4. Cont Renvela 5. Recheck CXR after HD to see if will need another tx   Sandi Towe T 03/18/2016, 6:39 AM

## 2016-03-18 NOTE — ED Notes (Signed)
Pt transported to dialysis by this RN via stretcher. Bedside report given to Dialysis, RN who states she took verbal order from nephrologist and will place order in computer, as well as have pt sign consent prior to hemodialysis. Pt a/o x 4. SpO2 96% on 5L/min O2. Remains hypertensive 175/118. No other acute distress noted.

## 2016-03-18 NOTE — Progress Notes (Signed)
Patient arrived on [facility], from Hemodialysis.  Patient came to ED and went to hemodialysis from the ED.  Patient arrived with discharge orders, signed at 08:02.  Patient to be discharged shortly.  Jillyn Ledger, MBA, BSN, RN

## 2016-03-18 NOTE — Progress Notes (Signed)
SATURATION QUALIFICATIONS: (This note is used to comply with regulatory documentation for home oxygen)  Patient Saturations on Room Air at Rest = 92%  Patient Saturations on Room Air while Ambulating = 87%  Patient Saturations on 2 Liters of oxygen while Ambulating = 94%  Please briefly explain why patient needs home oxygen:  Jillyn Ledger, MBA, BSN, RN

## 2016-03-18 NOTE — Discharge Summary (Signed)
Physician Discharge Summary  [name] [name] MRN: [id number] DOB/AGE: 58-09-1956 58 y.o.  PCP: [name]   Admit date: 03/18/2016 Discharge date: 03/18/2016  Discharge Diagnoses:     Active Problems:   Pulmonary edema   ESRD (end stage renal disease) ([facility])   Fluid overload   End-stage renal disease on hemodialysis ([place])    Follow-up recommendations Follow-up with PCP in 3-5 days , including all  additional recommended appointments as below Follow-up CBC, CMP in 3-5 days       Current Discharge Medication List    START taking these medications   Details  hydrALAZINE (APRESOLINE) 25 MG tablet Take 1 tablet (25 mg total) by mouth 3 (three) times daily. Qty: 90 tablet, Refills: 1      CONTINUE these medications which have NOT CHANGED   Details  albuterol (PROVENTIL HFA;VENTOLIN HFA) 108 (90 Base) MCG/ACT inhaler Inhale 1-2 puffs into the lungs every 6 (six) hours as needed for wheezing or shortness of breath. Qty: 1 Inhaler, Refills: 0    amitriptyline (ELAVIL) 100 MG tablet Take 1 tablet (100 mg total) by mouth at bedtime. Qty: 30 tablet, Refills: 2    amLODipine (NORVASC) 10 MG tablet Take 1 tablet (10 mg total) by mouth daily. Qty: 30 tablet, Refills: 3    aspirin 81 MG chewable tablet Chew 1 tablet (81 mg total) by mouth daily.    atorvastatin (LIPITOR) 20 MG tablet Take 20 mg by mouth at bedtime. Reported on 03/14/2016    carvedilol (COREG) 25 MG tablet Take 1 tablet (25 mg total) by mouth 2 (two) times daily with a meal. Qty: 60 tablet, Refills: 0    cinacalcet (SENSIPAR) 90 MG tablet Take 90 mg by mouth at bedtime.     losartan (COZAAR) 100 MG tablet Take 100 mg by mouth at bedtime.    multivitamin (RENA-VIT) TABS tablet Take 1 tablet by mouth at bedtime. Qty: 30 tablet, Refills: 0    pregabalin (LYRICA) 50 MG capsule Take 1 capsule (50 mg total) by mouth daily. Qty: 30 capsule, Refills: 0    sevelamer carbonate (RENVELA) 800 MG tablet Take 4  tablets (3,200 mg total) by mouth 3 (three) times daily with meals. Qty: 320 tablet, Refills: 0    venlafaxine XR (EFFEXOR XR) 75 MG 24 hr capsule Take 1 capsule (75 mg total) by mouth daily with breakfast. Qty: 30 capsule, Refills: 2         Discharge Condition: Stable   Discharge Instructions Get Medicines reviewed and adjusted: Please take all your medications with you for your next visit with your Primary MD  Please request your Primary MD to go over all hospital tests and procedure/radiological results at the follow up, please ask your Primary MD to get all Hospital records sent to his/her office.  If you experience worsening of your admission symptoms, develop shortness of breath, life threatening emergency, suicidal or homicidal thoughts you must seek medical attention immediately by calling 911 or calling your MD immediately if symptoms less severe.  You must read complete instructions/literature along with all the possible adverse reactions/side effects for all the Medicines you take and that have been prescribed to you. Take any new Medicines after you have completely understood and accpet all the possible adverse reactions/side effects.   Do not drive when taking Pain medications.   Do not take more than prescribed Pain, Sleep and Anxiety Medications  Special Instructions: If you have smoked or chewed Tobacco in the last 2 yrs  please stop smoking, stop any regular Alcohol and or any Recreational drug use.  Wear Seat belts while driving.  Please note  You were cared for by a hospitalist during your hospital stay. Once you are discharged, your primary care physician will handle any further medical issues. Please note that NO REFILLS for any discharge medications will be authorized once you are discharged, as it is imperative that you return to your primary care physician (or establish a relationship with a primary care physician if you do not have one) for your aftercare  needs so that they can reassess your need for medications and monitor your lab values.  Discharge Instructions    Diet - low sodium heart healthy    Complete by:  As directed      Diet - low sodium heart healthy    Complete by:  As directed      Increase activity slowly    Complete by:  As directed      Increase activity slowly    Complete by:  As directed             Allergies  Allergen Reactions  . Enalapril Hives      Disposition: 01-Home or Self Care   Consults:  Nephrology     Significant Diagnostic Studies:  Dg Chest 2 View  03/17/2016  CLINICAL DATA:  Acute onset of central chest pain and nausea. Shortness of breath. Generalized weakness. Initial encounter. EXAM: CHEST  2 VIEW COMPARISON:  Chest radiograph performed 03/11/2016 FINDINGS: The lungs are well-aerated. There is mild elevation of the left hemidiaphragm. Vascular congestion is noted. Patchy bilateral airspace opacity, with sparing at the upper lung zones, may reflect pneumonia or pulmonary edema. No pleural effusion or pneumothorax is seen. The heart is borderline normal in size. No acute osseous abnormalities are seen. IMPRESSION: Vascular congestion noted. Patchy bilateral airspace opacity, with sparing at the upper lung zones, may reflect pneumonia or pulmonary edema. Electronically Signed   By: Garald Balding M.D.   On: 03/17/2016 21:40   Dg Chest 2 View  03/11/2016  CLINICAL DATA:  Mid chest pain off and on for 2 years. Hypertension. EXAM: CHEST  2 VIEW COMPARISON:  03/09/2016 FINDINGS: Cardiomegaly with vascular congestion. Diffuse bilateral airspace opacities, likely mild edema. This is slightly improved since prior study. Mild peribronchial thickening. No effusions or acute bony abnormality. IMPRESSION: Cardiomegaly with mild edema/ CHF, slightly improved since prior study. Electronically Signed   By: Rolm Baptise M.D.   On: 03/11/2016 15:48   Dg Chest 2 View  03/07/2016  CLINICAL DATA:  Central chest  pain for 2 days EXAM: CHEST  2 VIEW COMPARISON:  02/19/2016 FINDINGS: Cardiac shadow is stable. Interstitial changes are again identified which may represent a degree of interstitial edema. No focal confluent infiltrate is seen. The overall appearance is stable from the prior study. IMPRESSION: No significant interval change from the prior exam. Electronically Signed   By: Inez Catalina M.D.   On: 03/07/2016 12:51   Dg Chest 2 View  02/19/2016  CLINICAL DATA:  Shortness of breath and chest pain for 2 weeks. EXAM: CHEST  2 VIEW COMPARISON:  02/13/2016 FINDINGS: Shallow inspiration with elevation of the left hemidiaphragm. Heart size and pulmonary vascularity are normal. Central interstitial pattern to the lungs with mild bronchial wall thickening may indicate chronic bronchitis. No focal airspace disease or consolidation. No blunting of costophrenic angles. No pneumothorax. Calcification of the aorta. IMPRESSION: Central interstitial changes in the lungs possibly  representing chronic bronchitis or interstitial fibrosis. No evidence of active pulmonary disease. Electronically Signed   By: Lucienne Capers M.D.   On: 02/19/2016 21:39   Nm Pulmonary Perf And Vent  03/11/2016  CLINICAL DATA:  Shortness of Breath EXAM: NUCLEAR MEDICINE VENTILATION - PERFUSION LUNG SCAN TECHNIQUE: Ventilation images were obtained in multiple projections using inhaled aerosol Tc-18mDTPA. Perfusion images were obtained in multiple projections after intravenous injection of Tc-961mAA. RADIOPHARMACEUTICALS:  32.2 mCi Technetium-9928mPA aerosol inhalation and 4.35 mCi Technetium-27m41m IV COMPARISON:  Chest x-ray 03/09/2016 FINDINGS: Ventilation: No focal ventilation defect. Perfusion: No wedge shaped peripheral perfusion defects to suggest acute pulmonary embolism. IMPRESSION: No evidence of pulmonary embolus. Electronically Signed   By: KeviRolm Baptise.   On: 03/11/2016 14:01   Dg Chest Portable 1 View  03/09/2016  CLINICAL DATA:   Acute onset of shortness of breath and cough. Initial encounter. EXAM: PORTABLE CHEST 1 VIEW COMPARISON:  Chest radiograph performed 03/07/2016 FINDINGS: There is elevation of the left hemidiaphragm. Vascular congestion is noted. Increased interstitial markings raise concern for pulmonary edema. No definite pleural effusion or pneumothorax is seen. The cardiomediastinal silhouette is mildly enlarged. No acute osseous abnormalities are identified. IMPRESSION: Elevation of the left hemidiaphragm. Vascular congestion and mild cardiomegaly. Increased interstitial markings raise concern for pulmonary edema. Electronically Signed   By: JeffGarald Balding.   On: 03/09/2016 23:38        Filed Weights   03/18/16 0322 03/18/16 0722  Weight: 92.7 kg (204 lb 5.9 oz) 89.5 kg (197 lb 5 oz)     Microbiology: No results found for this or any previous visit (from the past 240 hour(s)).     Blood Culture    Component Value Date/Time   SDES URINE, CLEAN CATCH 12/23/2015 1332   SPECREQUEST NONE 12/23/2015 1332   CULT 1,000 COLONIES/mL INSIGNIFICANT GROWTH 12/23/2015 1332   REPTSTATUS 12/24/2015 FINAL 12/23/2015 1332      Labs: Results for orders placed or performed during the hospital encounter of 03/18/16 (from the past 48 hour(s))  I-stat troponin, ED     Status: None   Collection Time: 03/17/16  9:25 PM  Result Value Ref Range   Troponin i, poc 0.03 0.00 - 0.08 ng/mL   Comment 3            Comment: Due to the release kinetics of cTnI, a negative result within the first hours of the onset of symptoms does not rule out myocardial infarction with certainty. If myocardial infarction is still suspected, repeat the test at appropriate intervals.   Basic metabolic panel     Status: Abnormal   Collection Time: 03/17/16  9:33 PM  Result Value Ref Range   Sodium 137 135 - 145 mmol/L   Potassium 4.6 3.5 - 5.1 mmol/L   Chloride 98 (L) 101 - 111 mmol/L   CO2 24 22 - 32 mmol/L   Glucose, Bld 96 65  - 99 mg/dL   BUN 58 (H) 6 - 20 mg/dL   Creatinine, Ser 10.11 (H) 0.61 - 1.24 mg/dL   Calcium 10.4 (H) 8.9 - 10.3 mg/dL   GFR calc non Af Amer 5 (L) >60 mL/min   GFR calc Af Amer 6 (L) >60 mL/min    Comment: (NOTE) The eGFR has been calculated using the CKD EPI equation. This calculation has not been validated in all clinical situations. eGFR's persistently <60 mL/min signify possible Chronic Kidney Disease.    Anion gap 15 5 -  15  CBC     Status: Abnormal   Collection Time: 03/17/16  9:33 PM  Result Value Ref Range   WBC 8.3 4.0 - 10.5 K/uL   RBC 4.50 4.22 - 5.81 MIL/uL   Hemoglobin 10.8 (L) 13.0 - 17.0 g/dL   HCT 35.8 (L) 39.0 - 52.0 %   MCV 79.6 78.0 - 100.0 fL   MCH 24.0 (L) 26.0 - 34.0 pg   MCHC 30.2 30.0 - 36.0 g/dL   RDW 20.6 (H) 11.5 - 15.5 %   Platelets 136 (L) 150 - 400 K/uL        HPI   [name] [name] is a 60 y.o. male with medical history significant of ESRD on HD (T/Th/Sat at [facility] dialysis [facility]), DM 2, CHF, CAD, HTN, and HLD; who presents with complaints of chest pain. Patient reported stabbing chest pain substernal. Patient notes that this is the same pain that he always gets. He reports missing dialysis Saturday secondary to going to his son's graduation. Associated symptoms included some nausea and shortness of breath. Patient denies striking anything except for coming to the emergency department to relieve symptoms.   ED Course: Upon admission patient was evaluated and seen to be afebrile, heart rates up to 115, respirations up to 30, blood pressure of 2 209/114, O2 saturations maintained on nasal cannula oxygen. Lab work revealed hemoglobin 10.8, platelets 137, BUN 58, creatinine 10.11, calcium 10.4. Dr. Mercy Moore of nephrology consulted in the ED and recommended patient receiving hemodialysis in a.m. and TRH to admit the patient.   HOSPITAL COURSE:    Fluid overload/ End stage renal disease/ missed HD: acute. patient with pulmonary edema seen on chest  x-ray Patient is discharged On 6/5 after missed hemodialysis - Continuous pulse oximetry with nasal cannula oxygen to keep O2 sats greater than 90% Patient seen by nephrology, Dr. Mercy Moore patient   Dialyzed Today. - Continue home meds  Chest pain: Acute on chronic. Troponins negative and EKG showing sinus tachycardia. Cardiac enzymes negative - Recheck EKG in a.m. -Telemetry shows normal sinus rhyth  Accelerated hypertension - Continue home meds, Added hydralazine - Hydralazine IV prn  Anemia of chronic kidney disease:hemoglobin 10.8  - Continue to monitor  Hypercalcemia: Calcium 10.4  - Per nephrology   Discharge Exam:    Blood pressure 172/101, pulse 101, temperature 98 F (36.7 C), temperature source Oral, resp. rate 21, weight 89.5 kg (197 lb 5 oz), SpO2 96 %.  Cardiovascular: Regular rate and rhythm, no murmurs / rubs / gallops.+1 pitting edema of the bilateral lower extremities . 2+ pedal pulses. No carotid bruits. AV fistula present in the left upper arm  Abdomen: no tenderness, no masses palpated. No hepatosplenomegaly. Bowel sounds positive.  Musculoskeletal: no clubbing / cyanosis. No joint deformity upper and lower extremities. Good ROM, no contractures. Normal muscle tone.  Skin: no rashes, lesions, ulcers. No induration Neurologic: CN 2-12 grossly intact. Sensation intact, DTR normal. Strength 5/5 in all 4.  Psychiatric: Normal judgment and insight. Alert and oriented x 3. Normal mood.       SignedReyne Dumas 03/18/2016, 8:02 AM        Time spent >45 mins

## 2016-03-19 DIAGNOSIS — Z5181 Encounter for therapeutic drug level monitoring: Secondary | ICD-10-CM | POA: Diagnosis not present

## 2016-03-20 DIAGNOSIS — N186 End stage renal disease: Secondary | ICD-10-CM | POA: Diagnosis not present

## 2016-03-20 DIAGNOSIS — D631 Anemia in chronic kidney disease: Secondary | ICD-10-CM | POA: Diagnosis not present

## 2016-03-20 DIAGNOSIS — N2581 Secondary hyperparathyroidism of renal origin: Secondary | ICD-10-CM | POA: Diagnosis not present

## 2016-03-20 DIAGNOSIS — E119 Type 2 diabetes mellitus without complications: Secondary | ICD-10-CM | POA: Diagnosis not present

## 2016-03-22 DIAGNOSIS — E119 Type 2 diabetes mellitus without complications: Secondary | ICD-10-CM | POA: Diagnosis not present

## 2016-03-22 DIAGNOSIS — D631 Anemia in chronic kidney disease: Secondary | ICD-10-CM | POA: Diagnosis not present

## 2016-03-22 DIAGNOSIS — N2581 Secondary hyperparathyroidism of renal origin: Secondary | ICD-10-CM | POA: Diagnosis not present

## 2016-03-22 DIAGNOSIS — N186 End stage renal disease: Secondary | ICD-10-CM | POA: Diagnosis not present

## 2016-03-23 ENCOUNTER — Telehealth: Payer: Self-pay | Admitting: Cardiology

## 2016-03-23 ENCOUNTER — Other Ambulatory Visit: Payer: Self-pay

## 2016-03-23 ENCOUNTER — Inpatient Hospital Stay (HOSPITAL_COMMUNITY)
Admission: EM | Admit: 2016-03-23 | Discharge: 2016-03-27 | DRG: 291 | Disposition: A | Discharge status: Home / Self Care | Payer: Medicare Other | Attending: Internal Medicine | Admitting: Internal Medicine

## 2016-03-23 ENCOUNTER — Encounter (HOSPITAL_COMMUNITY): Payer: Self-pay

## 2016-03-23 ENCOUNTER — Emergency Department (HOSPITAL_COMMUNITY): Payer: Medicare Other

## 2016-03-23 DIAGNOSIS — E1143 Type 2 diabetes mellitus with diabetic autonomic (poly)neuropathy: Secondary | ICD-10-CM | POA: Diagnosis present

## 2016-03-23 DIAGNOSIS — N2581 Secondary hyperparathyroidism of renal origin: Secondary | ICD-10-CM | POA: Diagnosis not present

## 2016-03-23 DIAGNOSIS — I509 Heart failure, unspecified: Secondary | ICD-10-CM | POA: Diagnosis not present

## 2016-03-23 DIAGNOSIS — R0789 Other chest pain: Secondary | ICD-10-CM | POA: Diagnosis not present

## 2016-03-23 DIAGNOSIS — Z87891 Personal history of nicotine dependence: Secondary | ICD-10-CM

## 2016-03-23 DIAGNOSIS — I110 Hypertensive heart disease with heart failure: Secondary | ICD-10-CM | POA: Diagnosis not present

## 2016-03-23 DIAGNOSIS — D631 Anemia in chronic kidney disease: Secondary | ICD-10-CM | POA: Diagnosis present

## 2016-03-23 DIAGNOSIS — Z9861 Coronary angioplasty status: Secondary | ICD-10-CM

## 2016-03-23 DIAGNOSIS — R0602 Shortness of breath: Secondary | ICD-10-CM | POA: Diagnosis not present

## 2016-03-23 DIAGNOSIS — I5033 Acute on chronic diastolic (congestive) heart failure: Secondary | ICD-10-CM | POA: Diagnosis not present

## 2016-03-23 DIAGNOSIS — E119 Type 2 diabetes mellitus without complications: Secondary | ICD-10-CM | POA: Diagnosis not present

## 2016-03-23 DIAGNOSIS — R079 Chest pain, unspecified: Secondary | ICD-10-CM

## 2016-03-23 DIAGNOSIS — Z7982 Long term (current) use of aspirin: Secondary | ICD-10-CM

## 2016-03-23 DIAGNOSIS — J9601 Acute respiratory failure with hypoxia: Secondary | ICD-10-CM | POA: Diagnosis not present

## 2016-03-23 DIAGNOSIS — Z955 Presence of coronary angioplasty implant and graft: Secondary | ICD-10-CM

## 2016-03-23 DIAGNOSIS — R112 Nausea with vomiting, unspecified: Secondary | ICD-10-CM

## 2016-03-23 DIAGNOSIS — K3184 Gastroparesis: Secondary | ICD-10-CM | POA: Diagnosis present

## 2016-03-23 DIAGNOSIS — N186 End stage renal disease: Secondary | ICD-10-CM | POA: Diagnosis not present

## 2016-03-23 DIAGNOSIS — E1122 Type 2 diabetes mellitus with diabetic chronic kidney disease: Secondary | ICD-10-CM | POA: Diagnosis present

## 2016-03-23 DIAGNOSIS — I2510 Atherosclerotic heart disease of native coronary artery without angina pectoris: Secondary | ICD-10-CM | POA: Diagnosis present

## 2016-03-23 DIAGNOSIS — R1310 Dysphagia, unspecified: Secondary | ICD-10-CM

## 2016-03-23 DIAGNOSIS — I132 Hypertensive heart and chronic kidney disease with heart failure and with stage 5 chronic kidney disease, or end stage renal disease: Principal | ICD-10-CM | POA: Diagnosis present

## 2016-03-23 DIAGNOSIS — E785 Hyperlipidemia, unspecified: Secondary | ICD-10-CM | POA: Diagnosis present

## 2016-03-23 DIAGNOSIS — N250 Renal osteodystrophy: Secondary | ICD-10-CM | POA: Diagnosis present

## 2016-03-23 DIAGNOSIS — Z8639 Personal history of other endocrine, nutritional and metabolic disease: Secondary | ICD-10-CM

## 2016-03-23 DIAGNOSIS — J811 Chronic pulmonary edema: Secondary | ICD-10-CM

## 2016-03-23 DIAGNOSIS — I10 Essential (primary) hypertension: Secondary | ICD-10-CM | POA: Diagnosis present

## 2016-03-23 DIAGNOSIS — R0600 Dyspnea, unspecified: Secondary | ICD-10-CM | POA: Diagnosis present

## 2016-03-23 DIAGNOSIS — E8770 Fluid overload, unspecified: Secondary | ICD-10-CM | POA: Diagnosis present

## 2016-03-23 DIAGNOSIS — Z992 Dependence on renal dialysis: Secondary | ICD-10-CM

## 2016-03-23 DIAGNOSIS — Z9981 Dependence on supplemental oxygen: Secondary | ICD-10-CM

## 2016-03-23 LAB — COMPREHENSIVE METABOLIC PANEL
ALT: 11 U/L — ABNORMAL LOW (ref 17–63)
ANION GAP: 14 (ref 5–15)
AST: 11 U/L — ABNORMAL LOW (ref 15–41)
Albumin: 3.3 g/dL — ABNORMAL LOW (ref 3.5–5.0)
Alkaline Phosphatase: 78 U/L (ref 38–126)
BUN: 41 mg/dL — AB (ref 6–20)
CALCIUM: 9.7 mg/dL (ref 8.9–10.3)
CO2: 26 mmol/L (ref 22–32)
Chloride: 100 mmol/L — ABNORMAL LOW (ref 101–111)
Creatinine, Ser: 8.1 mg/dL — ABNORMAL HIGH (ref 0.61–1.24)
GFR calc Af Amer: 7 mL/min — ABNORMAL LOW (ref >60)
GFR, EST NON AFRICAN AMERICAN: 6 mL/min — AB (ref >60)
Glucose, Bld: 94 mg/dL (ref 65–99)
POTASSIUM: 3.9 mmol/L (ref 3.5–5.1)
Sodium: 140 mmol/L (ref 135–145)
TOTAL PROTEIN: 7 g/dL (ref 6.5–8.1)
Total Bilirubin: 0.8 mg/dL (ref 0.3–1.2)

## 2016-03-23 LAB — CBC
HEMATOCRIT: 33.5 % — AB (ref 39.0–52.0)
Hemoglobin: 9.9 g/dL — ABNORMAL LOW (ref 13.0–17.0)
MCH: 23.5 pg — ABNORMAL LOW (ref 26.0–34.0)
MCHC: 29.6 g/dL — AB (ref 30.0–36.0)
MCV: 79.4 fL (ref 78.0–100.0)
Platelets: 146 10*3/uL — ABNORMAL LOW (ref 150–400)
RBC: 4.22 MIL/uL (ref 4.22–5.81)
RDW: 20.4 % — AB (ref 11.5–15.5)
WBC: 7.1 10*3/uL (ref 4.0–10.5)

## 2016-03-23 LAB — PROTIME-INR
INR: 1.35 (ref 0.00–1.49)
PROTHROMBIN TIME: 16.8 s — AB (ref 11.6–15.2)

## 2016-03-23 LAB — I-STAT TROPONIN, ED: TROPONIN I, POC: 0 ng/mL (ref 0.00–0.08)

## 2016-03-23 LAB — APTT: aPTT: 37 seconds (ref 24–37)

## 2016-03-23 MED ORDER — MORPHINE SULFATE (PF) 4 MG/ML IV SOLN
4.0000 mg | Freq: Once | INTRAVENOUS | Status: AC
  Administered 2016-03-23: 4 mg via INTRAVENOUS
  Filled 2016-03-23: qty 1

## 2016-03-23 MED ORDER — NITROGLYCERIN 2 % TD OINT
1.0000 [in_us] | TOPICAL_OINTMENT | Freq: Once | TRANSDERMAL | Status: AC
  Administered 2016-03-23: 1 [in_us] via TOPICAL
  Filled 2016-03-23: qty 1

## 2016-03-23 MED ORDER — ASPIRIN 81 MG PO CHEW: 324.0000 mg | CHEWABLE_TABLET | Freq: Once | ORAL | Status: DC

## 2016-03-23 NOTE — Telephone Encounter (Signed)
Spoke to patient   patient states he is feeling chest pain behind the to knots in his chest. States short of breath  , wearing oxygen  Patient can reproduce discomfort. RN informed patient chest pain dealing with heart (MI)YOU CAN NOT REPRODUCE THE PAIN States short of breath, patient is able to have conversation without audible  Breathing sounds Patient has an appointment 03/26/16 Recommend patient--  Call EMS  [place]  NO NTG available  Patient states he will go after while  will notify cardmaster

## 2016-03-23 NOTE — H&P (Signed)
Aquila Mahi [id number] DOB: [birth year] DOA: 03/23/2016     PCP: [name]   Outpatient Specialists: Nephrology Patient coming from:    home Lives  With family    Chief Complaint: Right-sided chest pain shortness of breath  HPI: [name] [name] is a 60 y.o. male with medical history significant of end-stage renal disease and hemodialysis Thursday Thursday Saturday at [facility] kidney dialysis Center, diabetes 2, diastolic heart failure, CAD, HLD    Presented with chest pain in the right side and shortness of breath. Pain is coming and going currently much improved. Feels sharp and feels like pressure. Feels same as previous episodes. He states it is worse with eating. He has been throwing up what ever he is eating. He states what ever he swallows few minutes later it comes back up. This has been progressive and he is now unable to take any of his medications. Patient have had recurrent admissions for the same. She woke up this morning short of breath although reports has been like this for the past 2 weeks he hasn't been taking his medications because makes regurgitates. Feels that he shouldn't be taking any. States that since his discharge shown he felt a little bit better. He also he can't sleep because he is so short of breath given chest pain patient was advised to go to emergency department EMS administered 324 mg of aspirin and 2 nitros with some relief he is well-known to EMS services secondary to recurrent calls for same. Reports associated nausea and vomiting last dialysis was yesterday  Regarding pertinent Chronic problems: Has been admitted just June 10 through June 11 for chest pain and shortness of breath was treated with dialysis have had prior admissions on June 5 for the same patient has chronic chest pain with negative troponins in the past He has poorly controlled hypertension and hypercalcemia treated per nephrology   IN ER: Afebrile heart rate up to 105 respirations  up to 23 blood pressure 170/106 satting 99% on 2 L WBC 7.1 hemoglobin 9.9 platelets 146 creatinine 8.1 potassium 3.9  Chest x-ray shows stable cardiomegaly but evidence of vascular congestion progressive CHF    Hospitalist was called for admission for fluid overload in the setting of end-stage renal disease and diastolic heart failure  Review of Systems:    Pertinent positives include: fatigue chest pain, Orthopnea,  shortness of breath at rest.   dyspnea on exertion,   Constitutional:  No weight loss, night sweats, Fevers, chills, , weight loss  HEENT:  No headaches, Difficulty swallowing,Tooth/dental problems,Sore throat,  No sneezing, itching, ear ache, nasal congestion, post nasal drip,  Cardio-vascular:  No PND, anasarca, dizziness, palpitations.no Bilateral lower extremity swelling  GI:  No heartburn, indigestion, abdominal pain, nausea, vomiting, diarrhea, change in bowel habits, loss of appetite, melena, blood in stool, hematemesis Resp:  No excess mucus, no productive cough, No non-productive cough, No coughing up of blood.No change in color of mucus.No wheezing. Skin:  no rash or lesions. No jaundice GU:  no dysuria, change in color of urine, no urgency or frequency. No straining to urinate.  No flank pain.  Musculoskeletal:  No joint pain or no joint swelling. No decreased range of motion. No back pain.  Psych:  No change in mood or affect. No depression or anxiety. No memory loss.  Neuro: no localizing neurological complaints, no tingling, no weakness, no double vision, no gait abnormality, no slurred speech, no confusion  As per HPI otherwise 10 point  review of systems negative.   Past Medical History: Past Medical History  Diagnosis Date  . Hypertension   . Hematochezia     a. 2014: colonscopy, which showed moderately-sized internal hemorrhoids, two 38mm polyps in transverse colon and ascending colon that were resected, five 2-27mm polyps in sigmoid colon,  descending colon, transverse colon, and ascending colon that were resected. An upper endoscopy was performed and showed normal esophagus, stomach, and duodenum.  . Hematuria     a. H/o hematuria 2014 with cystoscopy that was unrevealing for his source of hematuria. He underwent a kidney ultrasound on 10/14 that showed mildly echogenic and scarred kidneys compatible with medical renal disease, without hydronephrosis or renal calculi.  [name] Anemia   . CAD (coronary artery disease)     a. per [facility] s/p 3.20mm x 30mm Vision BMS to mid LAD 12/2009 and Xience DES to mid LAD 10/2010.  . Colon polyps   . Chronic diastolic CHF (congestive heart failure) ([place])   . Hyperlipidemia   . Anginal pain ([place])   . Heart murmur   . 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 Type II diabetes mellitus ([place])   . History of blood transfusion     "had colonoscopy done; they had to give me some blood"  . Daily headache   . ESRD on dialysis [facility]) since ~ 2008    "[place]; TTS" (07/21/2015)  . On home oxygen therapy     "2L prn" (07/21/2015)  . Renal insufficiency    Past Surgical History  Procedure Laterality Date  . Left heart catheterization with coronary angiogram N/A 11/23/2014    Procedure: LEFT HEART CATHETERIZATION WITH CORONARY ANGIOGRAM;  Surgeon: Troy Sine, MD;  Location: [facility] CATH LAB;  Service: Cardiovascular;  Laterality: N/A;  . Lithotripsy  X1  . Cystoscopy w/ stone manipulation  X2?  . Cardiac catheterization  "several"  . Coronary angioplasty with stent placement  "several"  . Eye surgery Bilateral     "laser OR for hemorrhage"  . Av fistula placement Left ~ 2007    "upper arm"     Social History:  Ambulatory  independently      reports that he quit smoking about 5 years ago. His smoking use included Cigarettes. He has a 4 pack-year smoking history. He has never used smokeless tobacco. He reports that he does not drink alcohol or use illicit  drugs.  Allergies:   Allergies  Allergen Reactions  . Enalapril Hives    Family History:    Family History  Problem Relation Age of Onset  . Hypertension    . Bone cancer Mother   . Anuerysm Father   . Diabetes type II Daughter     Medications: Prior to Admission medications   Medication Sig Start Date End Date Taking? Authorizing Provider  albuterol (PROVENTIL HFA;VENTOLIN HFA) 108 (90 Base) MCG/ACT inhaler Inhale 1-2 puffs into the lungs every 6 (six) hours as needed for wheezing or shortness of breath. 01/19/16  Yes Delsa Grana, PA-C  amitriptyline (ELAVIL) 100 MG tablet Take 1 tablet (100 mg total) by mouth at bedtime. 07/27/15  Yes Shela Leff, MD  amLODipine (NORVASC) 10 MG tablet Take 1 tablet (10 mg total) by mouth daily. 10/30/15  Yes Ripudeep Krystal Eaton, MD  aspirin 81 MG chewable tablet Chew 1 tablet (81 mg total) by mouth daily. 07/22/15   Iline Oven, MD  atorvastatin (LIPITOR) 20 MG tablet Take  20 mg by mouth at bedtime. Reported on 03/14/2016    Historical Provider, MD  carvedilol (COREG) 25 MG tablet Take 1 tablet (25 mg total) by mouth 2 (two) times daily with a meal. 03/12/16   Geradine Girt, DO  cinacalcet (SENSIPAR) 90 MG tablet Take 90 mg by mouth at bedtime.     Historical Provider, MD  hydrALAZINE (APRESOLINE) 25 MG tablet Take 1 tablet (25 mg total) by mouth 3 (three) times daily. 03/18/16   Reyne Dumas, MD  losartan (COZAAR) 100 MG tablet Take 100 mg by mouth at bedtime.    Historical Provider, MD  multivitamin (RENA-VIT) TABS tablet Take 1 tablet by mouth at bedtime. 12/15/14   Geradine Girt, DO  pregabalin (LYRICA) 50 MG capsule Take 1 capsule (50 mg total) by mouth daily. 12/15/14   Geradine Girt, DO  sevelamer carbonate (RENVELA) 800 MG tablet Take 4 tablets (3,200 mg total) by mouth 3 (three) times daily with meals. 03/12/16   Geradine Girt, DO  venlafaxine XR (EFFEXOR XR) 75 MG 24 hr capsule Take 1 capsule (75 mg total) by mouth daily with breakfast.  07/27/15   Shela Leff, MD    Physical Exam: Patient Vitals for the past 24 hrs:  BP Temp Temp src Pulse Resp SpO2 Height Weight  03/23/16 2345 (!) 170/106 mmHg - - 100 15 99 % - -  03/23/16 2330 (!) 177/110 mmHg - - - 17 - - -  03/23/16 2315 (!) 175/108 mmHg - - - 18 - - -  03/23/16 2300 (!) 164/102 mmHg - - 99 15 96 % - -  03/23/16 2250 154/69 mmHg - - 94 16 99 % - -  03/23/16 2245 (!) 175/110 mmHg - - 100 18 95 % - -  03/23/16 2230 (!) 173/115 mmHg - - 104 15 96 % - -  03/23/16 2215 (!) 177/111 mmHg - - 104 17 97 % - -  03/23/16 2200 151/95 mmHg - - 103 23 100 % - -  03/23/16 2145 183/100 mmHg - - 105 - 95 % - -  03/23/16 2139 (!) 183/103 mmHg 98 F (36.7 C) Oral 104 - 99 % 6' (1.829 m) 91.627 kg (202 lb)  03/23/16 2130 - - - - - 90 % - -    1. General:  in No Acute distress 2. Psychological: Alert and  Oriented 3. Head/ENT:   Moist  Mucous Membranes                          Head Non traumatic, neck supple                          Poor Dentition 4. SKIN: normal  Skin turgor,  Skin clean Dry and intact no rash 5. Heart: Regular rate and rhythm,  no  Murmur, Rub or gallop 6. Lungs:  no wheezes Occasional mild crackles   7. Abdomen: Soft, non-tender, Non distended 8. Lower extremities: no clubbing, cyanosis, or edema 9. Neurologically Grossly intact, moving all 4 extremities equally 10. MSK: Normal range of motion   body mass index is 27.39 kg/(m^2).  Labs on Admission:   Labs on Admission: I have personally reviewed following labs and imaging studies  CBC:  Recent Labs Lab 03/17/16 2133 03/23/16 2212  WBC 8.3 7.1  HGB 10.8* 9.9*  HCT 35.8* 33.5*  MCV 79.6 79.4  PLT 136* 146*   Basic  Metabolic Panel:  Recent Labs Lab 03/17/16 2133 03/23/16 2212  NA 137 140  K 4.6 3.9  CL 98* 100*  CO2 24 26  GLUCOSE 96 94  BUN 58* 41*  CREATININE 10.11* 8.10*  CALCIUM 10.4* 9.7   GFR: Estimated Creatinine Clearance: 10.9 mL/min (by C-G formula based on Cr of  8.1). Liver Function Tests:  Recent Labs Lab 03/23/16 2212  AST 11*  ALT 11*  ALKPHOS 78  BILITOT 0.8  PROT 7.0  ALBUMIN 3.3*   No results for input(s): LIPASE, AMYLASE in the last 168 hours. No results for input(s): AMMONIA in the last 168 hours. Coagulation Profile:  Recent Labs Lab 03/23/16 2212  INR 1.35   Cardiac Enzymes: No results for input(s): CKTOTAL, CKMB, CKMBINDEX, TROPONINI in the last 168 hours. BNP (last 3 results) No results for input(s): PROBNP in the last 8760 hours. HbA1C: No results for input(s): HGBA1C in the last 72 hours. CBG:  Recent Labs Lab 03/18/16 0845 03/18/16 1134  GLUCAP 60* 217*   Lipid Profile: No results for input(s): CHOL, HDL, LDLCALC, TRIG, CHOLHDL, LDLDIRECT in the last 72 hours. Thyroid Function Tests: No results for input(s): TSH, T4TOTAL, FREET4, T3FREE, THYROIDAB in the last 72 hours. Anemia Panel: No results for input(s): VITAMINB12, FOLATE, FERRITIN, TIBC, IRON, RETICCTPCT in the last 72 hours. Urine analysis:    Component Value Date/Time   COLORURINE YELLOW 12/23/2015 1332   APPEARANCEUR CLEAR 12/23/2015 1332   LABSPEC 1.014 12/23/2015 1332   PHURINE 8.0 12/23/2015 1332   GLUCOSEU 100* 12/23/2015 1332   HGBUR MODERATE* 12/23/2015 1332   BILIRUBINUR NEGATIVE 12/23/2015 1332   KETONESUR NEGATIVE 12/23/2015 1332   PROTEINUR >300* 12/23/2015 1332   UROBILINOGEN 0.2 02/22/2015 2343   NITRITE NEGATIVE 12/23/2015 1332   LEUKOCYTESUR NEGATIVE 12/23/2015 1332   Sepsis Labs: @LABRCNTIP (procalcitonin:4,lacticidven:4) )No results found for this or any previous visit (from the past 240 hour(s)).    UA Anuric  Lab Results  Component Value Date   HGBA1C 5.6 03/14/2016    Estimated Creatinine Clearance: 10.9 mL/min (by C-G formula based on Cr of 8.1).  BNP (last 3 results) No results for input(s): PROBNP in the last 8760 hours.   ECG REPORT  Independently reviewed Rate: 102  Rhythm: Sinus tachycardia ST&T  Change: No acute ischemic changes  QTC 482  Filed Weights   03/23/16 2139  Weight: 91.627 kg (202 lb)     Cultures:    Component Value Date/Time   SDES URINE, CLEAN CATCH 12/23/2015 1332   SPECREQUEST NONE 12/23/2015 1332   CULT 1,000 COLONIES/mL INSIGNIFICANT GROWTH 12/23/2015 1332   REPTSTATUS 12/24/2015 FINAL 12/23/2015 1332     Radiological Exams on Admission: Dg Chest Portable 1 View  03/23/2016  CLINICAL DATA:  Shortness of breath and substernal chest pain today. EXAM: PORTABLE CHEST 1 VIEW COMPARISON:  Multiple prior exams most recently radiographs 03/17/2016. Chest CT 09/18/2015 FINDINGS: Stable cardiomegaly. Vascular congestion again seen. Worsening opening consistent with edema. No confluent airspace disease. No evidence of pleural effusion. No pneumothorax. IMPRESSION: Stable cardiomegaly and vascular congestion. Worsening pulmonary edema, progressive CHF. Electronically Signed   By: Jeb Levering M.D.   On: 03/23/2016 22:33    Chart has been reviewed    Assessment/Plan  60 y.o. male with medical history significant of end-stage renal disease and hemodialysis Thursday Thursday Saturday at [facility] kidney dialysis Center, diabetes 2, diastolic heart failure, CAD, HLD here with fluid overload, chest pain and worsening nausea and vomiting.   Present on Admission:  .  Acute on chronic diastolic CHF (congestive heart failure), NYHA class 1 ([place]) - patient anuric would not benefit from lasix. Last echo 6 days ago showing EF 45-50% . ESRD (end stage renal disease) ([place]) - HD in AM . Hypertension - continue home medications, having trouble tolerating orals. Hydralazine PRN . Fluid overload - ER provider consulted nephrology, also left message on answering service. HD in AM . Chest pain - atypical but given hx of CAD and persistent nature will admit and cycle CE. D in AM for fluid overload should help. Given severe hypertension and persistent chest pain will monitor in  stepdown . Dyspnea - likely due to fluid overload currently comfortable Vomiting - trouble tolerating PO, obtain KUB, may need GI evaluation for esophageal stricture vs gastroparesis given progressive nature. Started on Reglan DM - currently diet controlled only order SSI Other plan as per orders.  DVT prophylaxis:  SCD      Code Status:  FULL CODE   as per patient   Family Communication:   Family not  at  Bedside   Disposition Plan:     To home once workup is complete and patient is stable   Consults called: nephrology   Admission status:   inpatient       Level of care     stepdown         [place] 03/24/2016, 1:14 AM    [facility]  Pager [phone]   after 2 AM please page floor coverage PA If 7AM-7PM, please contact the day team taking care of the patient  [email or web address]  Password [email or web address]

## 2016-03-23 NOTE — Telephone Encounter (Signed)
New Message  Pt c/o of Chest Pain: 1. Are you having CP right now? Yes  2. Are you experiencing any other symptoms (ex. SOB, nausea, vomiting, sweating)?  Yes vomiting all of the time  3. How long have you been experiencing CP? Since he left the hospital.  4. Is your CP continuous or coming and going? Used to come and go but now its all of the time. He states that he may go a half of a day without throwing up but that is considered a good day.  5. Have you taken Nitroglycerin? Yes he is taking them but they do not work.    Complaint: 2 knots in his chest a little one and a big one. Woke up with SOB the big one has been there for 2 weeks. He says that he has been placed on oxygen. Pt states that he cannot take the medication that is given but it makes him regurgitate. So he proclaims that there is no need to take the medication because he cant hold it down. He states that it is getting to the point that he cant eat and that he doesn't want to eat. He was in intensive care last week, When he was in intensive he states that fluid was taken off but he still had SOB a little he felt better. But after a few days he went back to the same symptoms. Pt was advised to call the cardiologist to move forward.   No sleep due to SOB and tightness in the chest.   Pt c/o Shortness Of Breath: STAT if SOB developed within the last 24 hours or pt is noticeably SOB on the phone  1. Are you currently SOB (can you hear that pt is SOB on the phone)?  A little while on the phone and he is currently sitting  2. How long have you been experiencing SOB? Before and after release from hospital. D/c'd almost 2 weeks ago.  3. Are you SOB when sitting or when up moving around? Both  4.  Are you currently experiencing any other symptoms? Sweaty feeling. Just crazy!! He states that he has never felt like this!

## 2016-03-23 NOTE — ED Provider Notes (Signed)
CSN: [id number]     Arrival date & time 03/23/16  2129 History   First MD Initiated Contact with Patient 03/23/16 2139     Chief Complaint  Patient presents with  . Chest Pain  . Shortness of Breath   HPI Pt has been having constant chest pain for 24 hours.    The pain is located in the right chest behind the ribs.  It feels like a pressure.  No cough.  He has been vomiting.  He does feel short of breath.  Pt did go to dialysis yesterday.  He does have history of CAD.  He was recently in the hospital this past week for chest pain. Past Medical History  Diagnosis Date  . Hypertension   . Hematochezia     a. 2014: colonscopy, which showed moderately-sized internal hemorrhoids, two 3mm polyps in transverse colon and ascending colon that were resected, five 2-31mm polyps in sigmoid colon, descending colon, transverse colon, and ascending colon that were resected. An upper endoscopy was performed and showed normal esophagus, stomach, and duodenum.  . Hematuria     a. H/o hematuria 2014 with cystoscopy that was unrevealing for his source of hematuria. He underwent a kidney ultrasound on 10/14 that showed mildly echogenic and scarred kidneys compatible with medical renal disease, without hydronephrosis or renal calculi.  [name] Anemia   . CAD (coronary artery disease)     a. per [facility] s/p 3.57mm x 74mm Vision BMS to mid LAD 12/2009 and Xience DES to mid LAD 10/2010.  . Colon polyps   . Chronic diastolic CHF (congestive heart failure) ([place])   . Hyperlipidemia   . Anginal pain ([place])   . Heart murmur   . 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 Type II diabetes mellitus ([place])   . History of blood transfusion     "had colonoscopy done; they had to give me some blood"  . Daily headache   . ESRD on dialysis [facility]) since ~ 2008    "[place]; TTS" (07/21/2015)  . On home oxygen therapy     "2L prn" (07/21/2015)  . Renal insufficiency    Past Surgical History   Procedure Laterality Date  . Left heart catheterization with coronary angiogram N/A 11/23/2014    Procedure: LEFT HEART CATHETERIZATION WITH CORONARY ANGIOGRAM;  Surgeon: Troy Sine, MD;  Location: [facility] CATH LAB;  Service: Cardiovascular;  Laterality: N/A;  . Lithotripsy  X1  . Cystoscopy w/ stone manipulation  X2?  . Cardiac catheterization  "several"  . Coronary angioplasty with stent placement  "several"  . Eye surgery Bilateral     "laser OR for hemorrhage"  . Av fistula placement Left ~ 2007    "upper arm"   Family History  Problem Relation Age of Onset  . Hypertension    . Bone cancer Mother   . Anuerysm Father   . Diabetes type II Daughter    Social History  Substance Use Topics  . Smoking status: Former Smoker -- 0.50 packs/day for 8 years    Types: Cigarettes    Quit date: 12/06/2010  . Smokeless tobacco: Never Used  . Alcohol Use: No    Review of Systems  All other systems reviewed and are negative.     Allergies  Enalapril  Home Medications   Prior to Admission medications   Medication Sig Start Date End Date Taking? Authorizing Provider  albuterol (PROVENTIL HFA;VENTOLIN HFA) 108 (90  Base) MCG/ACT inhaler Inhale 1-2 puffs into the lungs every 6 (six) hours as needed for wheezing or shortness of breath. 01/19/16  Yes Delsa Grana, PA-C  amitriptyline (ELAVIL) 100 MG tablet Take 1 tablet (100 mg total) by mouth at bedtime. 07/27/15  Yes Shela Leff, MD  amLODipine (NORVASC) 10 MG tablet Take 1 tablet (10 mg total) by mouth daily. 10/30/15  Yes Ripudeep Krystal Eaton, MD  aspirin 81 MG chewable tablet Chew 1 tablet (81 mg total) by mouth daily. 07/22/15   Iline Oven, MD  atorvastatin (LIPITOR) 20 MG tablet Take 20 mg by mouth at bedtime. Reported on 03/14/2016    Historical Provider, MD  carvedilol (COREG) 25 MG tablet Take 1 tablet (25 mg total) by mouth 2 (two) times daily with a meal. 03/12/16   Geradine Girt, DO  cinacalcet (SENSIPAR) 90 MG tablet Take 90  mg by mouth at bedtime.     Historical Provider, MD  hydrALAZINE (APRESOLINE) 25 MG tablet Take 1 tablet (25 mg total) by mouth 3 (three) times daily. 03/18/16   Reyne Dumas, MD  losartan (COZAAR) 100 MG tablet Take 100 mg by mouth at bedtime.    Historical Provider, MD  multivitamin (RENA-VIT) TABS tablet Take 1 tablet by mouth at bedtime. 12/15/14   Geradine Girt, DO  pregabalin (LYRICA) 50 MG capsule Take 1 capsule (50 mg total) by mouth daily. 12/15/14   Geradine Girt, DO  sevelamer carbonate (RENVELA) 800 MG tablet Take 4 tablets (3,200 mg total) by mouth 3 (three) times daily with meals. 03/12/16   Geradine Girt, DO  venlafaxine XR (EFFEXOR XR) 75 MG 24 hr capsule Take 1 capsule (75 mg total) by mouth daily with breakfast. 07/27/15   Shela Leff, MD   BP 154/69 mmHg  Pulse 94  Temp(Src) 98 F (36.7 C) (Oral)  Resp 16  Ht 6' (1.829 m)  Wt 91.627 kg  BMI 27.39 kg/m2  SpO2 99% Physical Exam  Constitutional: No distress.  HENT:  Head: Normocephalic and atraumatic.  Right Ear: External ear normal.  Left Ear: External ear normal.  Eyes: Conjunctivae are normal. Right eye exhibits no discharge. Left eye exhibits no discharge. No scleral icterus.  Neck: Neck supple. No tracheal deviation present.  Cardiovascular: Normal rate, regular rhythm and intact distal pulses.   Pulmonary/Chest: Effort normal and breath sounds normal. No stridor. No respiratory distress. He has no wheezes. He has no rales. He exhibits tenderness.  Abdominal: Soft. Bowel sounds are normal. He exhibits no distension. There is no tenderness. There is no rebound and no guarding.  Musculoskeletal: He exhibits no edema or tenderness.  Neurological: He is alert. He has normal strength. No cranial nerve deficit (no facial droop, extraocular movements intact, no slurred speech) or sensory deficit. He exhibits normal muscle tone. He displays no seizure activity. Coordination normal.  Skin: Skin is warm and dry. No rash  noted. He is not diaphoretic.  Psychiatric: He has a normal mood and affect.  Nursing note and vitals reviewed.   ED Course  Procedures (including critical care time) Labs Review Labs Reviewed  CBC - Abnormal; Notable for the following:    Hemoglobin 9.9 (*)    HCT 33.5 (*)    MCH 23.5 (*)    MCHC 29.6 (*)    RDW 20.4 (*)    Platelets 146 (*)    All other components within normal limits  COMPREHENSIVE METABOLIC PANEL - Abnormal; Notable for the following:  Chloride 100 (*)    BUN 41 (*)    Creatinine, Ser 8.10 (*)    Albumin 3.3 (*)    AST 11 (*)    ALT 11 (*)    GFR calc non Af Amer 6 (*)    GFR calc Af Amer 7 (*)    All other components within normal limits  PROTIME-INR - Abnormal; Notable for the following:    Prothrombin Time 16.8 (*)    All other components within normal limits  APTT  I-STAT TROPOININ, ED    Imaging Review Dg Chest Portable 1 View  03/23/2016  CLINICAL DATA:  Shortness of breath and substernal chest pain today. EXAM: PORTABLE CHEST 1 VIEW COMPARISON:  Multiple prior exams most recently radiographs 03/17/2016. Chest CT 09/18/2015 FINDINGS: Stable cardiomegaly. Vascular congestion again seen. Worsening opening consistent with edema. No confluent airspace disease. No evidence of pleural effusion. No pneumothorax. IMPRESSION: Stable cardiomegaly and vascular congestion. Worsening pulmonary edema, progressive CHF. Electronically Signed   By: Jeb Levering M.D.   On: 03/23/2016 22:33   I have personally reviewed and evaluated these images and lab results as part of my medical decision-making.   EKG Interpretation   Date/Time:  Friday March 23 2016 21:28:01 EDT Ventricular Rate:  102 PR Interval:  150 QRS Duration: 100 QT Interval:  370 QTC Calculation: 482 R Axis:   87 Text Interpretation:  Sinus tachycardia Otherwise normal ECG No  significant change since last tracing Confirmed by Delaynee Alred  MD-J, Jamen Loiseau  [id number]) on 03/23/2016 9:43:34 PM      MDM    Final diagnoses:  Chest pain, unspecified chest pain type  Acute on chronic congestive heart failure, unspecified congestive heart failure type ([place])    Pt presents with recurrent chest pain.  He has a history of CAD.  Initial enzymes are normal but CXR suggests worsening chf.  He did go to dialysis yesterday.   I will consult with medical service to consider admission, serial enzymes, will need dialysis tomorrow.    Dorie Rank, MD 03/23/16 [phone]

## 2016-03-23 NOTE — ED Notes (Signed)
Pt from home by [facility] with Chest Pain and Shortness of breath. Pt had 324 mg ASA with EMS and 2 nitro's with some relief of pain. Pt was seen on Saturday with similar chest pain. Pt also had 4mg  of Zofran with ems and no nausea relief. Pt on 2L of O2 at home.

## 2016-03-24 ENCOUNTER — Inpatient Hospital Stay (HOSPITAL_COMMUNITY): Payer: Medicare Other

## 2016-03-24 DIAGNOSIS — D631 Anemia in chronic kidney disease: Secondary | ICD-10-CM | POA: Diagnosis not present

## 2016-03-24 DIAGNOSIS — E1143 Type 2 diabetes mellitus with diabetic autonomic (poly)neuropathy: Secondary | ICD-10-CM | POA: Diagnosis present

## 2016-03-24 DIAGNOSIS — I132 Hypertensive heart and chronic kidney disease with heart failure and with stage 5 chronic kidney disease, or end stage renal disease: Secondary | ICD-10-CM | POA: Diagnosis not present

## 2016-03-24 DIAGNOSIS — Z87891 Personal history of nicotine dependence: Secondary | ICD-10-CM | POA: Diagnosis not present

## 2016-03-24 DIAGNOSIS — I10 Essential (primary) hypertension: Secondary | ICD-10-CM | POA: Diagnosis not present

## 2016-03-24 DIAGNOSIS — Z5181 Encounter for therapeutic drug level monitoring: Secondary | ICD-10-CM | POA: Diagnosis not present

## 2016-03-24 DIAGNOSIS — E785 Hyperlipidemia, unspecified: Secondary | ICD-10-CM | POA: Diagnosis present

## 2016-03-24 DIAGNOSIS — Z7982 Long term (current) use of aspirin: Secondary | ICD-10-CM | POA: Diagnosis not present

## 2016-03-24 DIAGNOSIS — R079 Chest pain, unspecified: Secondary | ICD-10-CM | POA: Diagnosis not present

## 2016-03-24 DIAGNOSIS — N250 Renal osteodystrophy: Secondary | ICD-10-CM | POA: Diagnosis present

## 2016-03-24 DIAGNOSIS — I5033 Acute on chronic diastolic (congestive) heart failure: Secondary | ICD-10-CM | POA: Diagnosis not present

## 2016-03-24 DIAGNOSIS — E8770 Fluid overload, unspecified: Secondary | ICD-10-CM | POA: Diagnosis not present

## 2016-03-24 DIAGNOSIS — J9601 Acute respiratory failure with hypoxia: Secondary | ICD-10-CM | POA: Diagnosis present

## 2016-03-24 DIAGNOSIS — Z992 Dependence on renal dialysis: Secondary | ICD-10-CM | POA: Diagnosis not present

## 2016-03-24 DIAGNOSIS — R0600 Dyspnea, unspecified: Secondary | ICD-10-CM | POA: Diagnosis not present

## 2016-03-24 DIAGNOSIS — I2510 Atherosclerotic heart disease of native coronary artery without angina pectoris: Secondary | ICD-10-CM | POA: Diagnosis present

## 2016-03-24 DIAGNOSIS — E1122 Type 2 diabetes mellitus with diabetic chronic kidney disease: Secondary | ICD-10-CM | POA: Diagnosis not present

## 2016-03-24 DIAGNOSIS — N186 End stage renal disease: Secondary | ICD-10-CM | POA: Diagnosis present

## 2016-03-24 DIAGNOSIS — N2581 Secondary hyperparathyroidism of renal origin: Secondary | ICD-10-CM | POA: Diagnosis present

## 2016-03-24 DIAGNOSIS — K219 Gastro-esophageal reflux disease without esophagitis: Secondary | ICD-10-CM | POA: Diagnosis not present

## 2016-03-24 DIAGNOSIS — K3184 Gastroparesis: Secondary | ICD-10-CM | POA: Diagnosis not present

## 2016-03-24 DIAGNOSIS — J811 Chronic pulmonary edema: Secondary | ICD-10-CM | POA: Diagnosis not present

## 2016-03-24 DIAGNOSIS — R0602 Shortness of breath: Secondary | ICD-10-CM | POA: Diagnosis not present

## 2016-03-24 DIAGNOSIS — Z955 Presence of coronary angioplasty implant and graft: Secondary | ICD-10-CM | POA: Diagnosis not present

## 2016-03-24 DIAGNOSIS — E119 Type 2 diabetes mellitus without complications: Secondary | ICD-10-CM | POA: Diagnosis not present

## 2016-03-24 DIAGNOSIS — I120 Hypertensive chronic kidney disease with stage 5 chronic kidney disease or end stage renal disease: Secondary | ICD-10-CM | POA: Diagnosis not present

## 2016-03-24 DIAGNOSIS — Z9981 Dependence on supplemental oxygen: Secondary | ICD-10-CM | POA: Diagnosis not present

## 2016-03-24 LAB — CBC
HCT: 33.9 % — ABNORMAL LOW (ref 39.0–52.0)
HCT: 36.2 % — ABNORMAL LOW (ref 39.0–52.0)
Hemoglobin: 10.4 g/dL — ABNORMAL LOW (ref 13.0–17.0)
Hemoglobin: 10.8 g/dL — ABNORMAL LOW (ref 13.0–17.0)
MCH: 23.9 pg — ABNORMAL LOW (ref 26.0–34.0)
MCH: 23.7 pg — AB (ref 26.0–34.0)
MCHC: 30.7 g/dL (ref 30.0–36.0)
MCHC: 29.8 g/dL — AB (ref 30.0–36.0)
MCV: 77.9 fL — ABNORMAL LOW (ref 78.0–100.0)
MCV: 79.4 fL (ref 78.0–100.0)
PLATELETS: 142 10*3/uL — AB (ref 150–400)
Platelets: 143 10*3/uL — ABNORMAL LOW (ref 150–400)
RBC: 4.35 MIL/uL (ref 4.22–5.81)
RBC: 4.56 MIL/uL (ref 4.22–5.81)
RDW: 20.3 % — ABNORMAL HIGH (ref 11.5–15.5)
RDW: 20.5 % — AB (ref 11.5–15.5)
WBC: 9.1 10*3/uL (ref 4.0–10.5)
WBC: 10.4 10*3/uL (ref 4.0–10.5)

## 2016-03-24 LAB — COMPREHENSIVE METABOLIC PANEL
ALBUMIN: 3.8 g/dL (ref 3.5–5.0)
ALK PHOS: 78 U/L (ref 38–126)
ALT: 10 U/L — AB (ref 17–63)
ANION GAP: 13 (ref 5–15)
AST: 13 U/L — ABNORMAL LOW (ref 15–41)
BILIRUBIN TOTAL: 1.4 mg/dL — AB (ref 0.3–1.2)
BUN: 19 mg/dL (ref 6–20)
CALCIUM: 9.4 mg/dL (ref 8.9–10.3)
CO2: 24 mmol/L (ref 22–32)
CREATININE: 4.75 mg/dL — AB (ref 0.61–1.24)
Chloride: 98 mmol/L — ABNORMAL LOW (ref 101–111)
GFR calc Af Amer: 14 mL/min — ABNORMAL LOW (ref >60)
GFR calc non Af Amer: 12 mL/min — ABNORMAL LOW (ref >60)
GLUCOSE: 83 mg/dL (ref 65–99)
Potassium: 4.1 mmol/L (ref 3.5–5.1)
SODIUM: 135 mmol/L (ref 135–145)
TOTAL PROTEIN: 7.8 g/dL (ref 6.5–8.1)

## 2016-03-24 LAB — BASIC METABOLIC PANEL
Anion gap: 15 (ref 5–15)
BUN: 46 mg/dL — ABNORMAL HIGH (ref 6–20)
CO2: 24 mmol/L (ref 22–32)
Calcium: 9.9 mg/dL (ref 8.9–10.3)
Chloride: 99 mmol/L — ABNORMAL LOW (ref 101–111)
Creatinine, Ser: 8.65 mg/dL — ABNORMAL HIGH (ref 0.61–1.24)
GFR calc Af Amer: 7 mL/min — ABNORMAL LOW (ref >60)
GFR calc non Af Amer: 6 mL/min — ABNORMAL LOW (ref >60)
Glucose, Bld: 132 mg/dL — ABNORMAL HIGH (ref 65–99)
Potassium: 4.3 mmol/L (ref 3.5–5.1)
Sodium: 138 mmol/L (ref 135–145)

## 2016-03-24 LAB — GLUCOSE, CAPILLARY
GLUCOSE-CAPILLARY: 83 mg/dL (ref 65–99)
GLUCOSE-CAPILLARY: 148 mg/dL — AB (ref 65–99)

## 2016-03-24 LAB — TSH: TSH: 0.674 u[IU]/mL (ref 0.350–4.500)

## 2016-03-24 LAB — PHOSPHORUS: Phosphorus: 4.8 mg/dL — ABNORMAL HIGH (ref 2.5–4.6)

## 2016-03-24 LAB — CBG MONITORING, ED: Glucose-Capillary: 109 mg/dL — ABNORMAL HIGH (ref 65–99)

## 2016-03-24 LAB — TROPONIN I
TROPONIN I: 0.04 ng/mL — AB (ref <0.031)
TROPONIN I: 0.04 ng/mL — AB (ref <0.031)

## 2016-03-24 LAB — MAGNESIUM: MAGNESIUM: 1.8 mg/dL (ref 1.7–2.4)

## 2016-03-24 MED ORDER — ACETAMINOPHEN 325 MG PO TABS: 650.0000 mg | ORAL_TABLET | Freq: Four times a day (QID) | ORAL | Status: DC | PRN

## 2016-03-24 MED ORDER — HYDROCODONE-ACETAMINOPHEN 5-325 MG PO TABS: 1.0000 | ORAL_TABLET | ORAL | Status: DC | PRN

## 2016-03-24 MED ORDER — ASPIRIN 81 MG PO CHEW
81.0000 mg | CHEWABLE_TABLET | Freq: Every day | ORAL | Status: DC
  Administered 2016-03-24 – 2016-03-27 (×4): 81 mg via ORAL
  Filled 2016-03-24 (×4): qty 1

## 2016-03-24 MED ORDER — DOXERCALCIFEROL 4 MCG/2ML IV SOLN
INTRAVENOUS | Status: AC
  Filled 2016-03-24: qty 4

## 2016-03-24 MED ORDER — ALTEPLASE 2 MG IJ SOLR: 2.0000 mg | Freq: Once | INTRAMUSCULAR | Status: DC | PRN

## 2016-03-24 MED ORDER — HYDRALAZINE HCL 25 MG PO TABS
25.0000 mg | ORAL_TABLET | Freq: Three times a day (TID) | ORAL | Status: DC
  Administered 2016-03-24: 25 mg via ORAL
  Filled 2016-03-24 (×2): qty 1

## 2016-03-24 MED ORDER — ENOXAPARIN SODIUM 30 MG/0.3ML ~~LOC~~ SOLN
30.0000 mg | SUBCUTANEOUS | Status: DC
  Administered 2016-03-24 – 2016-03-26 (×3): 30 mg via SUBCUTANEOUS
  Filled 2016-03-24 (×3): qty 0.3

## 2016-03-24 MED ORDER — VENLAFAXINE HCL ER 75 MG PO CP24
75.0000 mg | ORAL_CAPSULE | Freq: Every day | ORAL | Status: DC
  Administered 2016-03-25 – 2016-03-27 (×2): 75 mg via ORAL
  Filled 2016-03-24 (×3): qty 1

## 2016-03-24 MED ORDER — PREGABALIN 50 MG PO CAPS
50.0000 mg | ORAL_CAPSULE | Freq: Every day | ORAL | Status: DC
  Administered 2016-03-24 – 2016-03-27 (×4): 50 mg via ORAL
  Filled 2016-03-24 (×4): qty 1

## 2016-03-24 MED ORDER — RENA-VITE PO TABS
1.0000 | ORAL_TABLET | Freq: Every day | ORAL | Status: DC
  Administered 2016-03-24 – 2016-03-26 (×3): 1 via ORAL
  Filled 2016-03-24 (×3): qty 1

## 2016-03-24 MED ORDER — LIDOCAINE HCL (PF) 1 % IJ SOLN: 5.0000 mL | INTRAMUSCULAR | Status: DC | PRN

## 2016-03-24 MED ORDER — INSULIN ASPART 100 UNIT/ML ~~LOC~~ SOLN: 0.0000 [IU] | SUBCUTANEOUS | Status: DC

## 2016-03-24 MED ORDER — HYDRALAZINE HCL 50 MG PO TABS
50.0000 mg | ORAL_TABLET | Freq: Three times a day (TID) | ORAL | Status: DC
  Administered 2016-03-24 – 2016-03-26 (×7): 50 mg via ORAL
  Filled 2016-03-24 (×7): qty 1

## 2016-03-24 MED ORDER — SEVELAMER CARBONATE 800 MG PO TABS: 3200.0000 mg | ORAL_TABLET | Freq: Three times a day (TID) | ORAL | Status: DC

## 2016-03-24 MED ORDER — SODIUM CHLORIDE 0.9 % IV SOLN: 250.0000 mL | INTRAVENOUS | Status: DC | PRN

## 2016-03-24 MED ORDER — ACETAMINOPHEN 650 MG RE SUPP: 650.0000 mg | Freq: Four times a day (QID) | RECTAL | Status: DC | PRN

## 2016-03-24 MED ORDER — ATORVASTATIN CALCIUM 20 MG PO TABS
20.0000 mg | ORAL_TABLET | Freq: Every day | ORAL | Status: DC
  Administered 2016-03-24 – 2016-03-26 (×3): 20 mg via ORAL
  Filled 2016-03-24 (×3): qty 1

## 2016-03-24 MED ORDER — HYDRALAZINE HCL 20 MG/ML IJ SOLN
10.0000 mg | Freq: Once | INTRAMUSCULAR | Status: AC
  Administered 2016-03-24: 10 mg via INTRAVENOUS
  Filled 2016-03-24: qty 1

## 2016-03-24 MED ORDER — AMLODIPINE BESYLATE 10 MG PO TABS
10.0000 mg | ORAL_TABLET | Freq: Every day | ORAL | Status: DC
  Administered 2016-03-24 – 2016-03-27 (×4): 10 mg via ORAL
  Filled 2016-03-24 (×4): qty 1

## 2016-03-24 MED ORDER — HYDRALAZINE HCL 20 MG/ML IJ SOLN: 10.0000 mg | INTRAMUSCULAR | Status: DC | PRN

## 2016-03-24 MED ORDER — CARVEDILOL 25 MG PO TABS
25.0000 mg | ORAL_TABLET | Freq: Two times a day (BID) | ORAL | Status: DC
  Administered 2016-03-24 – 2016-03-26 (×4): 25 mg via ORAL
  Filled 2016-03-24 (×4): qty 1

## 2016-03-24 MED ORDER — ONDANSETRON HCL 4 MG PO TABS: 4.0000 mg | ORAL_TABLET | Freq: Four times a day (QID) | ORAL | Status: DC | PRN

## 2016-03-24 MED ORDER — SODIUM CHLORIDE 0.9 % IV SOLN: 100.0000 mL | INTRAVENOUS | Status: DC | PRN

## 2016-03-24 MED ORDER — CINACALCET HCL 30 MG PO TABS
90.0000 mg | ORAL_TABLET | Freq: Every day | ORAL | Status: DC
  Administered 2016-03-24 – 2016-03-26 (×3): 90 mg via ORAL
  Filled 2016-03-24 (×3): qty 3

## 2016-03-24 MED ORDER — METOCLOPRAMIDE HCL 10 MG PO TABS
10.0000 mg | ORAL_TABLET | Freq: Three times a day (TID) | ORAL | Status: DC
  Administered 2016-03-25 (×2): 10 mg via ORAL
  Filled 2016-03-24 (×2): qty 1

## 2016-03-24 MED ORDER — DOXERCALCIFEROL 4 MCG/2ML IV SOLN
7.0000 ug | INTRAVENOUS | Status: DC
  Administered 2016-03-24: 7 ug via INTRAVENOUS
  Filled 2016-03-24: qty 4

## 2016-03-24 MED ORDER — ALBUTEROL SULFATE (2.5 MG/3ML) 0.083% IN NEBU
2.5000 mg | INHALATION_SOLUTION | Freq: Four times a day (QID) | RESPIRATORY_TRACT | Status: DC | PRN
Start: 2016-03-24 — End: 2016-03-27

## 2016-03-24 MED ORDER — SEVELAMER CARBONATE 800 MG PO TABS
3200.0000 mg | ORAL_TABLET | Freq: Three times a day (TID) | ORAL | Status: DC
  Administered 2016-03-25 – 2016-03-27 (×6): 3200 mg via ORAL
  Filled 2016-03-24 (×6): qty 4

## 2016-03-24 MED ORDER — ASPIRIN 81 MG PO CHEW: 81.0000 mg | CHEWABLE_TABLET | Freq: Every day | ORAL | Status: DC

## 2016-03-24 MED ORDER — PENTAFLUOROPROP-TETRAFLUOROETH EX AERO
1.0000 | INHALATION_SPRAY | CUTANEOUS | Status: DC | PRN
Start: 2016-03-24 — End: 2016-03-24

## 2016-03-24 MED ORDER — HEPARIN SODIUM (PORCINE) 1000 UNIT/ML DIALYSIS: 1000.0000 [IU] | INTRAMUSCULAR | Status: DC | PRN

## 2016-03-24 MED ORDER — LIDOCAINE-PRILOCAINE 2.5-2.5 % EX CREA: 1.0000 "application " | TOPICAL_CREAM | CUTANEOUS | Status: DC | PRN

## 2016-03-24 MED ORDER — METOCLOPRAMIDE HCL 5 MG/ML IJ SOLN
5.0000 mg | Freq: Four times a day (QID) | INTRAMUSCULAR | Status: DC
  Administered 2016-03-24 (×2): 5 mg via INTRAVENOUS
  Filled 2016-03-24 (×2): qty 2

## 2016-03-24 MED ORDER — SODIUM CHLORIDE 0.9% FLUSH
3.0000 mL | Freq: Two times a day (BID) | INTRAVENOUS | Status: DC
  Administered 2016-03-27: 3 mL via INTRAVENOUS

## 2016-03-24 MED ORDER — LOSARTAN POTASSIUM 50 MG PO TABS
100.0000 mg | ORAL_TABLET | Freq: Every day | ORAL | Status: DC
  Administered 2016-03-24 – 2016-03-26 (×3): 100 mg via ORAL
  Filled 2016-03-24 (×3): qty 2

## 2016-03-24 MED ORDER — SODIUM CHLORIDE 0.9% FLUSH
3.0000 mL | Freq: Two times a day (BID) | INTRAVENOUS | Status: DC
  Administered 2016-03-24 – 2016-03-26 (×4): 3 mL via INTRAVENOUS

## 2016-03-24 MED ORDER — PREGABALIN 50 MG PO CAPS: 50.0000 mg | ORAL_CAPSULE | Freq: Every day | ORAL | Status: DC

## 2016-03-24 MED ORDER — INSULIN ASPART 100 UNIT/ML ~~LOC~~ SOLN
0.0000 [IU] | Freq: Three times a day (TID) | SUBCUTANEOUS | Status: DC
  Administered 2016-03-26: 1 [IU] via SUBCUTANEOUS

## 2016-03-24 MED ORDER — AMITRIPTYLINE HCL 25 MG PO TABS
100.0000 mg | ORAL_TABLET | Freq: Every day | ORAL | Status: DC
  Administered 2016-03-24 – 2016-03-26 (×3): 100 mg via ORAL
  Filled 2016-03-24 (×3): qty 4

## 2016-03-24 MED ORDER — SODIUM CHLORIDE 0.9% FLUSH: 3.0000 mL | INTRAVENOUS | Status: DC | PRN

## 2016-03-24 MED ORDER — CINACALCET HCL 30 MG PO TABS
90.0000 mg | ORAL_TABLET | Freq: Every day | ORAL | Status: DC
  Filled 2016-03-24: qty 3

## 2016-03-24 MED ORDER — ONDANSETRON HCL 4 MG/2ML IJ SOLN: 4.0000 mg | Freq: Four times a day (QID) | INTRAMUSCULAR | Status: DC | PRN

## 2016-03-24 MED ORDER — HEPARIN SODIUM (PORCINE) 1000 UNIT/ML DIALYSIS
6000.0000 [IU] | Freq: Once | INTRAMUSCULAR | Status: AC
  Administered 2016-03-24: 6000 [IU] via INTRAVENOUS_CENTRAL
  Filled 2016-03-24: qty 6

## 2016-03-24 NOTE — Consult Note (Signed)
[facility] Renal Consultation Note    Indication for Consultation:  Management of ESRD/hemodialysis; anemia, hypertension/volume and secondary hyperparathyroidism  HPI: This is the 7th admission this year and 4th since May 14th for   [name] [name] is a 60 y.o. male. With ESRD secondary to DM on TTS HD at [facility] - He has been running his full treatments but generally leaving about 2 kg above edw with net UF volumes of 1.6 - 3.6 .  EDW was lowered 2 kg after his last admission though he has not attained this. He has run his full time the last 3 treatments but doesn't get to EDW because of being taken out of UF due to cramping.  He presents again with chest pain/SOB worse eating and also vomiting associated with eating; Still some SOB at rest.  Pulmonary edema on CXR. He has known CAD with prior stent. Predialysis weight today is 91.9 (EDW 88 and last post HD wt 89.8).  This is not tremendously excessive goal. He had a barium swallow this am which was negative for mass, stricture or ulcer but some dysmotility.  K yesterday was 3.9 hgb 9.9 trending down from last outpt HD hgb. Troponin this am is slightly up at 0.04 Echo 6/5 showed EF 45-50%, mod MR, severely dilated left atrium but no sig change from 05/2015.  Denies fevers, diarrhea.   Past Medical History  Diagnosis Date  . Hypertension   . Hematochezia     a. 2014: colonscopy, which showed moderately-sized internal hemorrhoids, two 16m polyps in transverse colon and ascending colon that were resected, five 2-345mpolyps in sigmoid colon, descending colon, transverse colon, and ascending colon that were resected. An upper endoscopy was performed and showed normal esophagus, stomach, and duodenum.  . Hematuria     a. H/o hematuria 2014 with cystoscopy that was unrevealing for his source of hematuria. He underwent a kidney ultrasound on 10/14 that showed mildly echogenic and scarred kidneys compatible with medical renal disease, without  hydronephrosis or renal calculi.  . Marland Kitchennemia   . CAD (coronary artery disease)     a. per [facility] s/p 3.5m42m 67m38msion BMS to mid LAD 12/2009 and Xience DES to mid LAD 10/2010.  . Colon polyps   . Chronic diastolic CHF (congestive heart failure) (HCC)[place]. Hyperlipidemia   . Anginal pain (HCC)[place]. Heart murmur   . 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 Type II diabetes mellitus (HCC)[place]. History of blood transfusion     "had colonoscopy done; they had to give me some blood"  . Daily headache   [facility]    [place]  . On home oxygen therapy     "2L prn" (07/21/2015)  . Renal insufficiency    Past Surgical History  Procedure Laterality Date  . Left heart catheterization with coronary angiogram N/A 11/23/2014    Procedure: LEFT HEART CATHETERIZATION WITH CORONARY ANGIOGRAM;  Surgeon: ThomTroy Sine;  [facility];  Service: Cardiovascular;  Laterality: N/A;  . Lithotripsy  X1  . Cystoscopy w/ stone manipulation  X2?  . Cardiac catheterization  "several"  . Coronary angioplasty with stent placement  "several"  . Eye surgery Bilateral     "laser OR for hemorrhage"  . Av fistula placement Left ~ 2007    "upper arm"   Family History  Problem Relation Age of Onset  . Hypertension    . Bone cancer Mother   . Anuerysm Father   . Diabetes type II Daughter    Social History:  reports that he quit smoking about 5 years ago. His smoking use included Cigarettes. He has a 4 pack-year smoking history. He has never used smokeless tobacco. He reports that he does not drink alcohol or use illicit drugs. Allergies  Allergen Reactions  . Enalapril Hives   Prior to Admission medications   Medication Sig Start Date End Date Taking? Authorizing Provider  albuterol (PROVENTIL HFA;VENTOLIN HFA) 108 (90 Base) MCG/ACT inhaler Inhale 1-2 puffs into the lungs every 6 (six) hours as needed for wheezing or  shortness of breath. 01/19/16  Yes Delsa Grana, PA-C  amitriptyline (ELAVIL) 100 MG tablet Take 1 tablet (100 mg total) by mouth at bedtime. 07/27/15  Yes Shela Leff, MD  amLODipine (NORVASC) 10 MG tablet Take 1 tablet (10 mg total) by mouth daily. 10/30/15  Yes Ripudeep Krystal Eaton, MD  aspirin 81 MG chewable tablet Chew 1 tablet (81 mg total) by mouth daily. 07/22/15   Iline Oven, MD  atorvastatin (LIPITOR) 20 MG tablet Take 20 mg by mouth at bedtime. Reported on 03/14/2016    Historical Provider, MD  carvedilol (COREG) 25 MG tablet Take 1 tablet (25 mg total) by mouth 2 (two) times daily with a meal. 03/12/16   Geradine Girt, DO  cinacalcet (SENSIPAR) 90 MG tablet Take 90 mg by mouth at bedtime.     Historical Provider, MD  hydrALAZINE (APRESOLINE) 25 MG tablet Take 1 tablet (25 mg total) by mouth 3 (three) times daily. 03/18/16   Reyne Dumas, MD  losartan (COZAAR) 100 MG tablet Take 100 mg by mouth at bedtime.    Historical Provider, MD  multivitamin (RENA-VIT) TABS tablet Take 1 tablet by mouth at bedtime. 12/15/14   Geradine Girt, DO  pregabalin (LYRICA) 50 MG capsule Take 1 capsule (50 mg total) by mouth daily. 12/15/14   Geradine Girt, DO  sevelamer carbonate (RENVELA) 800 MG tablet Take 4 tablets (3,200 mg total) by mouth 3 (three) times daily with meals. 03/12/16   Geradine Girt, DO  venlafaxine XR (EFFEXOR XR) 75 MG 24 hr capsule Take 1 capsule (75 mg total) by mouth daily with breakfast. 07/27/15   Shela Leff, MD   Current Facility-Administered Medications  Medication Dose Route Frequency Provider Last Rate Last Dose  . 0.9 %  sodium chloride infusion  100 mL Intravenous PRN Alric Seton, PA-C      . 0.9 %  sodium chloride infusion  100 mL Intravenous PRN Alric Seton, PA-C      . alteplase (CATHFLO ACTIVASE) injection 2 mg  2 mg Intracatheter Once PRN Alric Seton, PA-C      . doxercalciferol (HECTOROL) injection 7 mcg  7 mcg Intravenous Q T,Th,Sa-HD Alric Seton, PA-C       . heparin injection 1,000 Units  1,000 Units Dialysis PRN Alric Seton, PA-C      . lidocaine (PF) (XYLOCAINE) 1 % injection 5 mL  5 mL Intradermal PRN Alric Seton, PA-C      . lidocaine-prilocaine (EMLA) cream 1 application  1 application Topical PRN Alric Seton, PA-C      . metoCLOPramide (REGLAN) injection 5 mg  5 mg Intravenous Q6H Toy Baker, MD   5 mg at 03/24/16 0604  . pentafluoroprop-tetrafluoroeth (GEBAUERS) aerosol 1 application  1 application Topical PRN Alric Seton, PA-C  Labs: Basic Metabolic Panel:  Recent Labs Lab 03/17/16 2133 03/23/16 2212 03/24/16 1015  NA 137 140 138  K 4.6 3.9 4.3  CL 98* 100* 99*  CO2 '24 26 24  ' GLUCOSE 96 94 132*  BUN 58* 41* 46*  CREATININE 10.11* 8.10* 8.65*  CALCIUM 10.4* 9.7 9.9   Liver Function Tests:  Recent Labs Lab 03/23/16 2212  AST 11*  ALT 11*  ALKPHOS 78  BILITOT 0.8  PROT 7.0  ALBUMIN 3.3*   CBC:  Recent Labs Lab 03/17/16 2133 03/23/16 2212  WBC 8.3 7.1  HGB 10.8* 9.9*  HCT 35.8* 33.5*  MCV 79.6 79.4  PLT 136* 146*   Cardiac Enzymes:  Recent Labs Lab 03/24/16 0656  TROPONINI 0.04*   CBG:  Recent Labs Lab 03/18/16 0845 03/18/16 1134 03/24/16 0643  GLUCAP 60* 217* 109*   Studies/Results: Dg Esophagus  03/24/2016  CLINICAL DATA:  60 year old male presents from the emergency department for evaluation of progressive dysphagia with food regurgitation. EXAM: ESOPHOGRAM/BARIUM SWALLOW TECHNIQUE: Single contrast examination was performed using  thin barium. FLUOROSCOPY TIME:  Fluoroscopy Time:  2 minutes 36 seconds. Number of Acquired Images: 3 exposures (remaining images are screen or cine captures). COMPARISON:  None. FINDINGS: The oral and pharyngeal phases of swallowing are normal, with no laryngeal penetration or tracheobronchial aspiration. No significant barium retention in the pharynx. No evidence of pharyngeal mass, stricture or diverticulum. There is mild cricopharyngeus  muscle dysfunction characterized by delayed opening of the cricopharyngeus muscle. There is mild esophageal dysmotility, characterized by mild intermittent weakening of primary peristalsis in the mid to lower thoracic esophagus. No hiatal hernia. Mild gastroesophageal reflux to the level of the lower thoracic esophagus. Normal esophageal distensibility. No evidence of esophageal mass, ulcer or stricture. A swallowed 13 mm barium tablet traversed the esophagus into the stomach without delay. IMPRESSION: 1. Normal oral and pharyngeal phases of swallowing, with no laryngeal penetration or tracheobronchial aspiration. 2. Mild gastroesophageal reflux.  No hiatal hernia. 3. Mild esophageal dysmotility and cricopharyngeus muscle dysfunction, characteristic of chronic gastroesophageal reflux disease. 4. No evidence of esophageal mass, ulcer or stricture. Electronically Signed   By: Ilona Sorrel M.D.   On: 03/24/2016 09:58   Dg Chest Portable 1 View  03/23/2016  CLINICAL DATA:  Shortness of breath and substernal chest pain today. EXAM: PORTABLE CHEST 1 VIEW COMPARISON:  Multiple prior exams most recently radiographs 03/17/2016. Chest CT 09/18/2015 FINDINGS: Stable cardiomegaly. Vascular congestion again seen. Worsening opening consistent with edema. No confluent airspace disease. No evidence of pleural effusion. No pneumothorax. IMPRESSION: Stable cardiomegaly and vascular congestion. Worsening pulmonary edema, progressive CHF. Electronically Signed   By: Jeb Levering M.D.   On: 03/23/2016 22:33    ROS: As per HPI otherwise negative.   Physical Exam: Filed Vitals:   03/24/16 0952 03/24/16 1001 03/24/16 1005 03/24/16 1030  BP: 179/104 180/100 166/100 158/84  Pulse: 105 105 99 96  Temp: 97.4 F (36.3 C)     TempSrc: Oral     Resp: 16 21    Height:      Weight: 91.8 kg (202 lb 6.1 oz)     SpO2: 95% 96%       General: NAD on O2 on HD Head: Normocephalic, atraumatic, sclera non-icteric, mucus membranes  are moist Neck: Supple.  Lungs: rales up 2/3 no wheezes/rhonchi Heart: RRR with S1 S2. Abdomen: Soft, non-tender, non-distended with normoactive bowel sounds. No rebound/guarding. No obvious abdominal masses. M-S:  Strength and tone appear normal for  age. Lower extremities:without edema  Neuro: Alert and oriented X 3. Moves all extremities spontaneously. Psych:  Responds to questions appropriately with a normal affect. Dialysis Access: left upper AVF  Dialysis Orders: AF TTS 4 hr 500/800 2 K 2 Ca profile 2 heparin 8000 EDW 88.5 - does not get to it no  Fe - s/p recent course of Fe- hectorol 7, Mircera 150 lst 6/13 Recent labs:  hgb 11 11% sat ferritin 1600 iPTH 475 - P 7-10 - Ca ok  Assessment/Plan: 1. Chest pain/hx CAD/SOB/pulmonary edema- last tox screen 12/2015- no CP at present - + SOB at rest- needs volume down; plan repeat CXR post HD today - may need extra tmt if fluid hasn't cleared; ? GI component to symptoms 2. ESRD -  TTS - HD today L 4/3 - change to 3 K bath- cramping at outpt unit - consider d/c profile 2 at discharge; adherence to treatments has been improved but still not getting to edw; need to figure out why- goal today 4.2 - see how he does during tmt today in regards to cramping. 3. Hypertension/volume  - BP up as it is at the HD unit - Meds at last d/c were:  coreg 25 bid, hydralazine 10 tid and losartan 100 ( MED Refill Hx in [place] shows that he had coreg 25 bid, norvasc 10 and losartan 100 filled 03/08/16) 4. Anemia  - hgb 9.9 down some from last outpt HD of 11-just had ESA on 6/13- no Fe 5. Metabolic bone disease -  Continue hectorol - normally on 2 Ca bath - but added K necessitates 2.25 Ca bath; gets binders from [facility] - 4 renvela and sensipar dose unknown (90 per our records) 6. Nutrition - renal carb mod/vits 7. DM - per primary           Myriam Jacobson, PA-C [place] [phone] 03/24/2016, 10:59 AM   Pt seen, examined and agree w A/P  as above. ESRD pt with recurrent admissions for fluid overload and pulm edema.  Plan HD today, max UF as tolerated.  Kelly Splinter MD [employer] pager [phone]    cell [phone] 03/24/2016, 2:15 PM

## 2016-03-24 NOTE — Progress Notes (Signed)
Report given to [facility], RN. CCMD called and receiving RN informed that patient is tele patient.

## 2016-03-24 NOTE — Procedures (Signed)
  I was present at this dialysis session, have reviewed the session itself and made  appropriate changes Kelly Splinter MD [place] pager [phone]    cell [phone] 03/24/2016, 2:48 PM

## 2016-03-24 NOTE — ED Notes (Signed)
Dr. Rogue Bussing notified on pt.'s persistent elevated blood pressure .

## 2016-03-24 NOTE — ED Notes (Signed)
Dr. Marvis Repress advised RN that pt. will be upgraded to stepdown due to persistent elevated BP and intermittent chest pain .

## 2016-03-24 NOTE — Progress Notes (Addendum)
PROGRESS NOTE  [name] [name]  [id number] DOB: 1956/09/10 DOA: 03/23/2016 PCP: [name]  Brief Narrative:   [name] [name] is a 60 y.o. male with medical history significant of end-stage renal disease and hemodialysis Thursday Thursday Saturday at [facility] kidney dialysis Center, diabetes 2, diastolic heart failure, CAD, HLD.     Presented with recurrent chest pain in the right side and shortness of breath. Pain is coming and going, feels sharp and like pressure, same as previous episodes. Worse with eating.  Having regurgitation and vomiting of food.  Also has progressive worsening shortness of breath.  Previous symptoms improved with volume removal with HD.  Chest x-ray shows stable cardiomegaly but evidence of vascular congestion progressive CHF  Assessment & Plan:   Active Problems:   Hypertension   CAD -S/P LAD BMS 2011, LAD DES 2012- patent cors Feb 2016   Diabetes mellitus type 2, diet-controlled ([place])   Acute on chronic diastolic CHF (congestive heart failure), NYHA class 1 (HCC)   Dyspnea   ESRD (end stage renal disease) (HCC)   Fluid overload   Chest pain  Pulmonary edema secondary to ESRD with inadequate volume removal.  His dry weight was supposed to have been lowered at his outpatient HD center, however, his weight has remained unchanged since the last time he was discharged.  -  Currently in HD and may need some additional frequent sessions before safe for discharge.   -  Repeat CXR post HD today  Regurgitation of food/nausea, may be due to gastroparesis, gastric edema -  Esophagram demonstrated only mild reflux, no obstruction -  Trial of reglan > increase to 10mg  before meals -  KUB to r/o obstruction or constipation  Chronic diastolic heart failure, volume managed by hemodialysis  Hypertension, blood pressures very elevated  -  Continue volume removal at HD -  Continue norvasc, carvedilol -  Increase hydralazine -  May need addition of clonidine if  blood pressures cannot be brought down by HD  Diabetes mellitus type 2, A1c 5.6 on 6/7 -  Change to mealtime insulin and if only using a minimal amount, plan to d/c   DVT prophylaxis:  lovenox Code Status:  full Family Communication:  Patient alone  Disposition Plan:  Pending tolerating diet   Consultants:   Nephrology  Procedures:  HD  Antimicrobials:   none    Subjective: Ongoing nausea and regurgitation of food, particularly after meals.  Everything keeps "coming back up"  Early satiety.  Still SOB.    Objective: Filed Vitals:   03/24/16 1336 03/24/16 1400 03/24/16 1403 03/24/16 1459  BP: 180/103 162/89 164/92 188/88  Pulse: 96 107 105 109  Temp:   98 F (36.7 C) 98 F (36.7 C)  TempSrc:   Oral Oral  Resp:   19 16  Height:      Weight:   89.4 kg (197 lb 1.5 oz)   SpO2:   98% 97%    Intake/Output Summary (Last 24 hours) at 03/24/16 1709 Last data filed at 03/24/16 1403  Gross per 24 hour  Intake      0 ml  Output   2122 ml  Net  -2122 ml   Filed Weights   03/23/16 2139 03/24/16 0952 03/24/16 1403  Weight: 91.627 kg (202 lb) 91.8 kg (202 lb 6.1 oz) 89.4 kg (197 lb 1.5 oz)    Examination:  General exam:  Adult male, seen at HD.  No acute distress.  HEENT:  NCAT, MMM Respiratory  system:  Wheezing with some faint rales at the bases, no rhonchi Cardiovascular system: Regular rate and rhythm, referred murmur/bruit from left arm. No rubs, gallops or clicks.  Warm extremities Gastrointestinal system: Normal active bowel sounds, soft, mildly distended, nontender MSK:  Normal tone and bulk, no lower extremity edema Neuro:  Grossly intact    Data Reviewed: I have personally reviewed following labs and imaging studies  CBC:  Recent Labs Lab 03/17/16 2133 03/23/16 2212 03/24/16 1015 03/24/16 1504  WBC 8.3 7.1 9.1 10.4  HGB 10.8* 9.9* 10.4* 10.8*  HCT 35.8* 33.5* 33.9* 36.2*  MCV 79.6 79.4 77.9* 79.4  PLT 136* 146* 143* [id number]*   Basic Metabolic  Panel:  Recent Labs Lab 03/17/16 2133 03/23/16 2212 03/24/16 1015 03/24/16 1504  NA 137 140 138 135  K 4.6 3.9 4.3 4.1  CL 98* 100* 99* 98*  CO2 24 26 24 24   GLUCOSE 96 94 132* 83  BUN 58* 41* 46* 19  CREATININE 10.11* 8.10* 8.65* 4.75*  CALCIUM 10.4* 9.7 9.9 9.4  MG  --   --   --  1.8  PHOS  --   --   --  4.8*   GFR: Estimated Creatinine Clearance: 18.6 mL/min (by C-G formula based on Cr of 4.75). Liver Function Tests:  Recent Labs Lab 03/23/16 2212 03/24/16 1504  AST 11* 13*  ALT 11* 10*  ALKPHOS 78 78  BILITOT 0.8 1.4*  PROT 7.0 7.8  ALBUMIN 3.3* 3.8   No results for input(s): LIPASE, AMYLASE in the last 168 hours. No results for input(s): AMMONIA in the last 168 hours. Coagulation Profile:  Recent Labs Lab 03/23/16 2212  INR 1.35   Cardiac Enzymes:  Recent Labs Lab 03/24/16 0656 03/24/16 1504  TROPONINI 0.04* 0.04*   BNP (last 3 results) No results for input(s): PROBNP in the last 8760 hours. HbA1C: No results for input(s): HGBA1C in the last 72 hours. CBG:  Recent Labs Lab 03/18/16 0845 03/18/16 1134 03/24/16 0643 03/24/16 1503  GLUCAP 60* 217* 109* 83   Lipid Profile: No results for input(s): CHOL, HDL, LDLCALC, TRIG, CHOLHDL, LDLDIRECT in the last 72 hours. Thyroid Function Tests:  Recent Labs  03/24/16 1504  TSH 0.674   Anemia Panel: No results for input(s): VITAMINB12, FOLATE, FERRITIN, TIBC, IRON, RETICCTPCT in the last 72 hours. Urine analysis:    Component Value Date/Time   COLORURINE YELLOW 12/23/2015 1332   APPEARANCEUR CLEAR 12/23/2015 1332   LABSPEC 1.014 12/23/2015 1332   PHURINE 8.0 12/23/2015 1332   GLUCOSEU 100* 12/23/2015 1332   HGBUR MODERATE* 12/23/2015 1332   BILIRUBINUR NEGATIVE 12/23/2015 1332   KETONESUR NEGATIVE 12/23/2015 1332   PROTEINUR >300* 12/23/2015 1332   UROBILINOGEN 0.2 02/22/2015 2343   NITRITE NEGATIVE 12/23/2015 1332   LEUKOCYTESUR NEGATIVE 12/23/2015 1332   Sepsis  Labs: @LABRCNTIP (procalcitonin:4,lacticidven:4)  )No results found for this or any previous visit (from the past 240 hour(s)).    Radiology Studies: Dg Esophagus  03/24/2016  CLINICAL DATA:  60 year old male presents from the emergency department for evaluation of progressive dysphagia with food regurgitation. EXAM: ESOPHOGRAM/BARIUM SWALLOW TECHNIQUE: Single contrast examination was performed using  thin barium. FLUOROSCOPY TIME:  Fluoroscopy Time:  2 minutes 36 seconds. Number of Acquired Images: 3 exposures (remaining images are screen or cine captures). COMPARISON:  None. FINDINGS: The oral and pharyngeal phases of swallowing are normal, with no laryngeal penetration or tracheobronchial aspiration. No significant barium retention in the pharynx. No evidence of pharyngeal mass, stricture or diverticulum.  There is mild cricopharyngeus muscle dysfunction characterized by delayed opening of the cricopharyngeus muscle. There is mild esophageal dysmotility, characterized by mild intermittent weakening of primary peristalsis in the mid to lower thoracic esophagus. No hiatal hernia. Mild gastroesophageal reflux to the level of the lower thoracic esophagus. Normal esophageal distensibility. No evidence of esophageal mass, ulcer or stricture. A swallowed 13 mm barium tablet traversed the esophagus into the stomach without delay. IMPRESSION: 1. Normal oral and pharyngeal phases of swallowing, with no laryngeal penetration or tracheobronchial aspiration. 2. Mild gastroesophageal reflux.  No hiatal hernia. 3. Mild esophageal dysmotility and cricopharyngeus muscle dysfunction, characteristic of chronic gastroesophageal reflux disease. 4. No evidence of esophageal mass, ulcer or stricture. Electronically Signed   By: Ilona Sorrel M.D.   On: 03/24/2016 09:58   Dg Chest Portable 1 View  03/23/2016  CLINICAL DATA:  Shortness of breath and substernal chest pain today. EXAM: PORTABLE CHEST 1 VIEW COMPARISON:  Multiple  prior exams most recently radiographs 03/17/2016. Chest CT 09/18/2015 FINDINGS: Stable cardiomegaly. Vascular congestion again seen. Worsening opening consistent with edema. No confluent airspace disease. No evidence of pleural effusion. No pneumothorax. IMPRESSION: Stable cardiomegaly and vascular congestion. Worsening pulmonary edema, progressive CHF. Electronically Signed   By: Jeb Levering M.D.   On: 03/23/2016 22:33     Scheduled Meds: . amitriptyline  100 mg Oral QHS  . amLODipine  10 mg Oral Daily  . aspirin  81 mg Oral Daily  . atorvastatin  20 mg Oral QHS  . carvedilol  25 mg Oral BID WC  . cinacalcet  90 mg Oral QHS  . cinacalcet  90 mg Oral Q supper  . doxercalciferol      . enoxaparin (LOVENOX) injection  30 mg Subcutaneous Q24H  . hydrALAZINE  25 mg Oral TID  . insulin aspart  0-9 Units Subcutaneous Q4H  . losartan  100 mg Oral QHS  . metoCLOPramide (REGLAN) injection  5 mg Intravenous Q6H  . multivitamin  1 tablet Oral QHS  . pregabalin  50 mg Oral Daily  . sevelamer carbonate  3,200 mg Oral TID WC  . sevelamer carbonate  3,200 mg Oral TID WC  . sodium chloride flush  3 mL Intravenous Q12H  . sodium chloride flush  3 mL Intravenous Q12H  . [START ON 03/25/2016] venlafaxine XR  75 mg Oral Q breakfast   Continuous Infusions:    LOS: 0 days    Time spent: 30 min    Janece Canterbury, MD [facility] Pager [phone]  If 7PM-7AM, please contact night-coverage [email or web address] Password [email or web address] 03/24/2016, 5:09 PM

## 2016-03-24 NOTE — ED Notes (Signed)
Pt taken to Xray.

## 2016-03-25 DIAGNOSIS — R112 Nausea with vomiting, unspecified: Secondary | ICD-10-CM

## 2016-03-25 DIAGNOSIS — J9601 Acute respiratory failure with hypoxia: Secondary | ICD-10-CM

## 2016-03-25 LAB — GLUCOSE, CAPILLARY
Glucose-Capillary: 108 mg/dL — ABNORMAL HIGH (ref 65–99)
Glucose-Capillary: 120 mg/dL — ABNORMAL HIGH (ref 65–99)
Glucose-Capillary: 91 mg/dL (ref 65–99)
Glucose-Capillary: 92 mg/dL (ref 65–99)

## 2016-03-25 MED ORDER — SENNA 8.6 MG PO TABS
2.0000 | ORAL_TABLET | Freq: Every day | ORAL | Status: DC
  Administered 2016-03-25 – 2016-03-26 (×2): 17.2 mg via ORAL
  Filled 2016-03-25 (×2): qty 2

## 2016-03-25 MED ORDER — METOCLOPRAMIDE HCL 10 MG PO TABS
10.0000 mg | ORAL_TABLET | Freq: Three times a day (TID) | ORAL | Status: DC
  Administered 2016-03-25 – 2016-03-27 (×7): 10 mg via ORAL
  Filled 2016-03-25 (×8): qty 1

## 2016-03-25 MED ORDER — POLYETHYLENE GLYCOL 3350 17 G PO PACK
17.0000 g | PACK | Freq: Every day | ORAL | Status: DC
  Administered 2016-03-27: 17 g via ORAL
  Filled 2016-03-25 (×2): qty 1

## 2016-03-25 NOTE — Progress Notes (Signed)
Utilization review completed.  

## 2016-03-25 NOTE — Progress Notes (Signed)
PROGRESS NOTE  [name] [name]  [id number] DOB: March 13, 1956 DOA: 03/23/2016 PCP: [name]  Brief Narrative:   [name] [name] is a 60 y.o. male with medical history significant of end-stage renal disease and hemodialysis Thursday Thursday Saturday at [facility] kidney dialysis [facility], diabetes 2, diastolic heart failure, CAD, HLD.   Presented with recurrent chest pain in the right side and shortness of breath. Pain is coming and going, feels sharp and like pressure, same as previous episodes. Worse with eating.  Having regurgitation and vomiting of food.  Also has progressive worsening shortness of breath.  Previous symptoms improved with volume removal with HD.  Chest x-ray showed vascular congestion.  Had HD, but still volume overloaded and symptomatic.  Staying for daily HD.  Assessment & Plan:   Active Problems:   Hypertension   CAD -S/P LAD BMS 2011, LAD DES 2012- patent cors Feb 2016   Diabetes mellitus type 2, diet-controlled ([place])   Acute on chronic diastolic CHF (congestive heart failure), NYHA class 1 (HCC)   Dyspnea   ESRD (end stage renal disease) (HCC)   Fluid overload   Chest pain  Acute hypoxic respiratory failure secondary to pulmonary edema from ESRD with inadequate volume removal.  His dry weight was supposed to have been lowered at his outpatient HD center, however, his weight has remained unchanged since the last time he was discharged.  -  daily HD until respiratory distress improves  Regurgitation of food/nausea, may be due to gastroparesis, gastric edema -  Esophagram demonstrated only mild reflux, no obstruction -  Trial of reglan > increase to 10mg  before meals and add before bed also -  KUB to r/o obstruction or constipation -  Start miralax and senna  Chronic diastolic heart failure, volume managed by hemodialysis  Hypertension, blood pressures improving with HD -  Continue volume removal at HD -  Continue norvasc, carvedilol -  Increased  hydralazine  Diabetes mellitus type 2, A1c 5.6 on 6/7 -  Change to mealtime insulin and if only using a minimal amount, plan to d/c   Anemia of chronic renal disease, no need for blood transfusion  DVT prophylaxis:  lovenox Code Status:  full Family Communication:  Patient alone  Disposition Plan:  Pending tolerating diet, additional HD   Consultants:   Nephrology  Procedures:  HD  Antimicrobials:   none    Subjective: Ongoing nausea and regurgitation of food, particularly after meals.  Had vomiting overnight last night.  Early satiety.  Still SOB.    Objective: Filed Vitals:   03/24/16 2100 03/25/16 0500 03/25/16 1000 03/25/16 1244  BP: 136/81 138/77 146/84 113/73  Pulse: 109 91 94 79  Temp: 99.1 F (37.3 C) 97.6 F (36.4 C) 98.4 F (36.9 C) 98.1 F (36.7 C)  TempSrc:   Oral Oral  Resp: 23 17 20 18   Height:      Weight:  89.767 kg (197 lb 14.4 oz)    SpO2: 98% 98% 100% 100%    Intake/Output Summary (Last 24 hours) at 03/25/16 1622 Last data filed at 03/25/16 1350  Gross per 24 hour  Intake    360 ml  Output    125 ml  Net    235 ml   Filed Weights   03/24/16 0952 03/24/16 1403 03/25/16 0500  Weight: 91.8 kg (202 lb 6.1 oz) 89.4 kg (197 lb 1.5 oz) 89.767 kg (197 lb 14.4 oz)    Examination:  General exam:  Adult male.  No acute  distress.  Nasal cannula in place HEENT:  NCAT, MMM Respiratory system:  Wheezing with some faint rales, no rhonchi Cardiovascular system: Regular rate and rhythm, referred murmur/bruit from left arm. No rubs, gallops or clicks.  Warm extremities Gastrointestinal system: Normal active bowel sounds, soft, mildly distended, nontender MSK:  Normal tone and bulk, no lower extremity edema Neuro:  Grossly intact    Data Reviewed: I have personally reviewed following labs and imaging studies  CBC:  Recent Labs Lab 03/23/16 2212 03/24/16 1015 03/24/16 1504  WBC 7.1 9.1 10.4  HGB 9.9* 10.4* 10.8*  HCT 33.5* 33.9* 36.2*   MCV 79.4 77.9* 79.4  PLT 146* 143* [id number]*   Basic Metabolic Panel:  Recent Labs Lab 03/23/16 2212 03/24/16 1015 03/24/16 1504  NA 140 138 135  K 3.9 4.3 4.1  CL 100* 99* 98*  CO2 26 24 24   GLUCOSE 94 132* 83  BUN 41* 46* 19  CREATININE 8.10* 8.65* 4.75*  CALCIUM 9.7 9.9 9.4  MG  --   --  1.8  PHOS  --   --  4.8*   GFR: Estimated Creatinine Clearance: 18.6 mL/min (by C-G formula based on Cr of 4.75). Liver Function Tests:  Recent Labs Lab 03/23/16 2212 03/24/16 1504  AST 11* 13*  ALT 11* 10*  ALKPHOS 78 78  BILITOT 0.8 1.4*  PROT 7.0 7.8  ALBUMIN 3.3* 3.8   No results for input(s): LIPASE, AMYLASE in the last 168 hours. No results for input(s): AMMONIA in the last 168 hours. Coagulation Profile:  Recent Labs Lab 03/23/16 2212  INR 1.35   Cardiac Enzymes:  Recent Labs Lab 03/24/16 0656 03/24/16 1504  TROPONINI 0.04* 0.04*   BNP (last 3 results) No results for input(s): PROBNP in the last 8760 hours. HbA1C: No results for input(s): HGBA1C in the last 72 hours. CBG:  Recent Labs Lab 03/24/16 0643 03/24/16 1503 03/24/16 2056 03/25/16 0734 03/25/16 1222  GLUCAP 109* 83 148* 108* 120*   Lipid Profile: No results for input(s): CHOL, HDL, LDLCALC, TRIG, CHOLHDL, LDLDIRECT in the last 72 hours. Thyroid Function Tests:  Recent Labs  03/24/16 1504  TSH 0.674   Anemia Panel: No results for input(s): VITAMINB12, FOLATE, FERRITIN, TIBC, IRON, RETICCTPCT in the last 72 hours. Urine analysis:    Component Value Date/Time   COLORURINE YELLOW 12/23/2015 1332   APPEARANCEUR CLEAR 12/23/2015 1332   LABSPEC 1.014 12/23/2015 1332   PHURINE 8.0 12/23/2015 1332   GLUCOSEU 100* 12/23/2015 1332   HGBUR MODERATE* 12/23/2015 1332   BILIRUBINUR NEGATIVE 12/23/2015 1332   KETONESUR NEGATIVE 12/23/2015 1332   PROTEINUR >300* 12/23/2015 1332   UROBILINOGEN 0.2 02/22/2015 2343   NITRITE NEGATIVE 12/23/2015 1332   LEUKOCYTESUR NEGATIVE 12/23/2015 1332    Sepsis Labs: @LABRCNTIP (procalcitonin:4,lacticidven:4)  )No results found for this or any previous visit (from the past 240 hour(s)).    Radiology Studies: Dg Esophagus  03/24/2016  CLINICAL DATA:  60 year old male presents from the emergency department for evaluation of progressive dysphagia with food regurgitation. EXAM: ESOPHOGRAM/BARIUM SWALLOW TECHNIQUE: Single contrast examination was performed using  thin barium. FLUOROSCOPY TIME:  Fluoroscopy Time:  2 minutes 36 seconds. Number of Acquired Images: 3 exposures (remaining images are screen or cine captures). COMPARISON:  None. FINDINGS: The oral and pharyngeal phases of swallowing are normal, with no laryngeal penetration or tracheobronchial aspiration. No significant barium retention in the pharynx. No evidence of pharyngeal mass, stricture or diverticulum. There is mild cricopharyngeus muscle dysfunction characterized by delayed opening of the  cricopharyngeus muscle. There is mild esophageal dysmotility, characterized by mild intermittent weakening of primary peristalsis in the mid to lower thoracic esophagus. No hiatal hernia. Mild gastroesophageal reflux to the level of the lower thoracic esophagus. Normal esophageal distensibility. No evidence of esophageal mass, ulcer or stricture. A swallowed 13 mm barium tablet traversed the esophagus into the stomach without delay. IMPRESSION: 1. Normal oral and pharyngeal phases of swallowing, with no laryngeal penetration or tracheobronchial aspiration. 2. Mild gastroesophageal reflux.  No hiatal hernia. 3. Mild esophageal dysmotility and cricopharyngeus muscle dysfunction, characteristic of chronic gastroesophageal reflux disease. 4. No evidence of esophageal mass, ulcer or stricture. Electronically Signed   By: Ilona Sorrel M.D.   On: 03/24/2016 09:58   Dg Chest 1v Repeat Same Day  03/24/2016  CLINICAL DATA:  Pulmonary edema. EXAM: CHEST - 1 VIEW SAME DAY COMPARISON:  03/23/2016 FINDINGS: The  cardiac silhouette is mildly enlarged. Mediastinal contours appear intact. There is no evidence of pneumothorax. There are diffusely increased interstitial markings bilaterally, most suggestive of interstitial pulmonary edema. No definite pleural effusions. Osseous structures are without acute abnormality. Soft tissues are grossly normal. IMPRESSION: Worsening of interstitial pulmonary edema. Mild cardiomegaly. Electronically Signed   By: Fidela Salisbury M.D.   On: 03/24/2016 17:34   Dg Chest Portable 1 View  03/23/2016  CLINICAL DATA:  Shortness of breath and substernal chest pain today. EXAM: PORTABLE CHEST 1 VIEW COMPARISON:  Multiple prior exams most recently radiographs 03/17/2016. Chest CT 09/18/2015 FINDINGS: Stable cardiomegaly. Vascular congestion again seen. Worsening opening consistent with edema. No confluent airspace disease. No evidence of pleural effusion. No pneumothorax. IMPRESSION: Stable cardiomegaly and vascular congestion. Worsening pulmonary edema, progressive CHF. Electronically Signed   By: Jeb Levering M.D.   On: 03/23/2016 22:33   Dg Abd Portable 1v  03/24/2016  CLINICAL DATA:  Pulmonary edema.  Possible gastroparesis. EXAM: PORTABLE ABDOMEN - 1 VIEW COMPARISON:  12/23/15 FINDINGS: Oral contrast from modified swallow earlier today is in distal small bowel and possibly proximal large bowel. No dilated loops. IMPRESSION: Negative. Electronically Signed   By: Skipper Cliche M.D.   On: 03/24/2016 17:37     Scheduled Meds: . amitriptyline  100 mg Oral QHS  . amLODipine  10 mg Oral Daily  . aspirin  81 mg Oral Daily  . atorvastatin  20 mg Oral QHS  . carvedilol  25 mg Oral BID WC  . cinacalcet  90 mg Oral QHS  . enoxaparin (LOVENOX) injection  30 mg Subcutaneous Q24H  . hydrALAZINE  50 mg Oral TID  . insulin aspart  0-9 Units Subcutaneous TID WC  . losartan  100 mg Oral QHS  . metoCLOPramide  10 mg Oral TID AC & HS  . multivitamin  1 tablet Oral QHS  . pregabalin   50 mg Oral Daily  . sevelamer carbonate  3,200 mg Oral TID WC  . sodium chloride flush  3 mL Intravenous Q12H  . sodium chloride flush  3 mL Intravenous Q12H  . venlafaxine XR  75 mg Oral Q breakfast   Continuous Infusions:    LOS: 1 day    Time spent: 30 min    Janece Canterbury, MD [facility] Pager [phone]  If 7PM-7AM, please contact night-coverage [email or web address] Password [facility] 03/25/2016, 4:22 PM

## 2016-03-25 NOTE — Progress Notes (Signed)
[facility] Progress Note   Subjective: only 2.1 Kg off yest. 91 > 89.5 kg post   Filed Vitals:   03/24/16 1403 03/24/16 1459 03/24/16 2100 03/25/16 0500  BP: 164/92 188/88 136/81 138/77  Pulse: 105 109 109 91  Temp: 98 F (36.7 C) 98 F (36.7 C) 99.1 F (37.3 C) 97.6 F (36.4 C)  TempSrc: Oral Oral    Resp: 19 16 23 17   Height:      Weight: 89.4 kg (197 lb 1.5 oz)   89.767 kg (197 lb 14.4 oz)  SpO2: 98% 97% 98% 98%    Inpatient medications: . amitriptyline  100 mg Oral QHS  . amLODipine  10 mg Oral Daily  . aspirin  81 mg Oral Daily  . atorvastatin  20 mg Oral QHS  . carvedilol  25 mg Oral BID WC  . cinacalcet  90 mg Oral QHS  . enoxaparin (LOVENOX) injection  30 mg Subcutaneous Q24H  . hydrALAZINE  50 mg Oral TID  . insulin aspart  0-9 Units Subcutaneous TID WC  . losartan  100 mg Oral QHS  . metoCLOPramide  10 mg Oral TID AC  . multivitamin  1 tablet Oral QHS  . pregabalin  50 mg Oral Daily  . sevelamer carbonate  3,200 mg Oral TID WC  . sodium chloride flush  3 mL Intravenous Q12H  . sodium chloride flush  3 mL Intravenous Q12H  . venlafaxine XR  75 mg Oral Q breakfast     sodium chloride, acetaminophen **OR** acetaminophen, albuterol, hydrALAZINE, HYDROcodone-acetaminophen, ondansetron **OR** ondansetron (ZOFRAN) IV, sodium chloride flush  Exam: Alert, no distress, calm No jvd Chest some scattered rales bilat, mostly clear RRR no RG Abd soft ntnd no ascites Ext no edema Alert Ox3, nf LUA AVF +bruit  Dialysis: AF TTS  4h  500/800  2/2 bath  P2  Hep 8000   88.5kg Doesn't make dry wt Recent course IV Fe Hect 7 ug Mirc 150 last 6/13 Lab - Hb 11, tfs 11%  ferr 1600  pth 475  P 7-10      Assessment: 1 Chest pain/ SOB/ pulm edema - better. Doesn't tolerate large UF due to cramping 2  ESRD TTS hd 3  Hx CAD 4  HTN coreg/ losart/ hydral/ amlod 5  Anemia stable 6  MBD as above 7  DM per prim  Plan - extra HD Monday, get vol down and  wean O2   Kelly Splinter MD [facility] pager [phone]    cell [phone] 03/25/2016, 6:50 AM    Recent Labs Lab 03/23/16 2212 03/24/16 1015 03/24/16 1504  NA 140 138 135  K 3.9 4.3 4.1  CL 100* 99* 98*  CO2 26 24 24   GLUCOSE 94 132* 83  BUN 41* 46* 19  CREATININE 8.10* 8.65* 4.75*  CALCIUM 9.7 9.9 9.4  PHOS  --   --  4.8*    Recent Labs Lab 03/23/16 2212 03/24/16 1504  AST 11* 13*  ALT 11* 10*  ALKPHOS 78 78  BILITOT 0.8 1.4*  PROT 7.0 7.8  ALBUMIN 3.3* 3.8    Recent Labs Lab 03/23/16 2212 03/24/16 1015 03/24/16 1504  WBC 7.1 9.1 10.4  HGB 9.9* 10.4* 10.8*  HCT 33.5* 33.9* 36.2*  MCV 79.4 77.9* 79.4  PLT 146* 143* 142*   Iron/TIBC/Ferritin/ %Sat    Component Value Date/Time   IRON 62 11/23/2014 0426   TIBC 180* 11/23/2014 0426   FERRITIN 1129* 11/23/2014 [id number]  IRONPCTSAT 34 11/23/2014 0426

## 2016-03-26 ENCOUNTER — Ambulatory Visit: Payer: Medicare Other | Admitting: Cardiology

## 2016-03-26 ENCOUNTER — Encounter: Payer: Self-pay | Admitting: *Deleted

## 2016-03-26 DIAGNOSIS — E8770 Fluid overload, unspecified: Secondary | ICD-10-CM

## 2016-03-26 DIAGNOSIS — I10 Essential (primary) hypertension: Secondary | ICD-10-CM

## 2016-03-26 DIAGNOSIS — E119 Type 2 diabetes mellitus without complications: Secondary | ICD-10-CM

## 2016-03-26 DIAGNOSIS — I5033 Acute on chronic diastolic (congestive) heart failure: Secondary | ICD-10-CM

## 2016-03-26 LAB — CBC WITH DIFFERENTIAL/PLATELET
BASOS ABS: 0 10*3/uL (ref 0.0–0.1)
Basophils Relative: 0 %
EOS ABS: 0.2 10*3/uL (ref 0.0–0.7)
EOS PCT: 3 %
HCT: 33 % — ABNORMAL LOW (ref 39.0–52.0)
HEMOGLOBIN: 9.8 g/dL — AB (ref 13.0–17.0)
LYMPHS ABS: 0.8 10*3/uL (ref 0.7–4.0)
Lymphocytes Relative: 13 %
MCH: 23.6 pg — AB (ref 26.0–34.0)
MCHC: 29.7 g/dL — ABNORMAL LOW (ref 30.0–36.0)
MCV: 79.5 fL (ref 78.0–100.0)
Monocytes Absolute: 0.4 10*3/uL (ref 0.1–1.0)
Monocytes Relative: 6 %
NEUTROS PCT: 79 %
Neutro Abs: 5 10*3/uL (ref 1.7–7.7)
PLATELETS: 134 10*3/uL — AB (ref 150–400)
RBC: 4.15 MIL/uL — AB (ref 4.22–5.81)
RDW: 20.1 % — ABNORMAL HIGH (ref 11.5–15.5)
WBC: 6.4 10*3/uL (ref 4.0–10.5)

## 2016-03-26 LAB — HEMOGLOBIN A1C
HEMOGLOBIN A1C: 5.4 % (ref 4.8–5.6)
Mean Plasma Glucose: 108 mg/dL

## 2016-03-26 LAB — RENAL FUNCTION PANEL
Albumin: 3 g/dL — ABNORMAL LOW (ref 3.5–5.0)
Anion gap: 11 (ref 5–15)
BUN: 44 mg/dL — ABNORMAL HIGH (ref 6–20)
CALCIUM: 9.4 mg/dL (ref 8.9–10.3)
CHLORIDE: 98 mmol/L — AB (ref 101–111)
CO2: 25 mmol/L (ref 22–32)
CREATININE: 8.53 mg/dL — AB (ref 0.61–1.24)
GFR calc non Af Amer: 6 mL/min — ABNORMAL LOW (ref >60)
GFR, EST AFRICAN AMERICAN: 7 mL/min — AB (ref >60)
GLUCOSE: 92 mg/dL (ref 65–99)
Phosphorus: 8.6 mg/dL — ABNORMAL HIGH (ref 2.5–4.6)
Potassium: 5.1 mmol/L (ref 3.5–5.1)
SODIUM: 134 mmol/L — AB (ref 135–145)

## 2016-03-26 LAB — GLUCOSE, CAPILLARY
GLUCOSE-CAPILLARY: 97 mg/dL (ref 65–99)
Glucose-Capillary: 148 mg/dL — ABNORMAL HIGH (ref 65–99)
Glucose-Capillary: 96 mg/dL (ref 65–99)

## 2016-03-26 MED ORDER — HEPARIN SODIUM (PORCINE) 1000 UNIT/ML DIALYSIS
6000.0000 [IU] | Freq: Once | INTRAMUSCULAR | Status: DC
Start: 2016-03-27 — End: 2016-03-26

## 2016-03-26 MED ORDER — LIDOCAINE-PRILOCAINE 2.5-2.5 % EX CREA: 1.0000 "application " | TOPICAL_CREAM | CUTANEOUS | Status: DC | PRN

## 2016-03-26 MED ORDER — HEPARIN SODIUM (PORCINE) 1000 UNIT/ML DIALYSIS
1000.0000 [IU] | INTRAMUSCULAR | Status: DC | PRN
  Filled 2016-03-26: qty 1

## 2016-03-26 MED ORDER — PENTAFLUOROPROP-TETRAFLUOROETH EX AERO: 1.0000 "application " | INHALATION_SPRAY | CUTANEOUS | Status: DC | PRN

## 2016-03-26 MED ORDER — ALTEPLASE 2 MG IJ SOLR: 2.0000 mg | Freq: Once | INTRAMUSCULAR | Status: DC | PRN

## 2016-03-26 MED ORDER — DOXERCALCIFEROL 4 MCG/2ML IV SOLN
7.0000 ug | INTRAVENOUS | Status: DC
  Administered 2016-03-27: 7 ug via INTRAVENOUS
  Filled 2016-03-26: qty 4

## 2016-03-26 MED ORDER — LIDOCAINE HCL (PF) 1 % IJ SOLN: 5.0000 mL | INTRAMUSCULAR | Status: DC | PRN

## 2016-03-26 MED ORDER — SODIUM CHLORIDE 0.9 % IV SOLN: 100.0000 mL | INTRAVENOUS | Status: DC | PRN

## 2016-03-26 NOTE — Progress Notes (Signed)
PROGRESS NOTE  [name] [name]  [id number] DOB: Feb 25, 1956 DOA: 03/23/2016 PCP: [name]  Brief Narrative:   [name] [name] is a 60 y.o. male with medical history significant of end-stage renal disease and hemodialysis Thursday Thursday Saturday at [facility] kidney dialysis Center, diabetes 2, diastolic heart failure, CAD, HLD.   Presented with recurrent chest pain in the right side and shortness of breath. Pain is coming and going, feels sharp and like pressure, same as previous episodes. Worse with eating.  Having regurgitation and vomiting of food.  Also has progressive worsening shortness of breath.  Previous symptoms improved with volume removal with HD.  Chest x-ray showed vascular congestion.  Had HD, but still volume overloaded and symptomatic.  Staying for daily HD.  Assessment & Plan:   Active Problems:   Hypertension   CAD -S/P LAD BMS 2011, LAD DES 2012- patent cors Feb 2016   Diabetes mellitus type 2, diet-controlled ([place])   Acute on chronic diastolic CHF (congestive heart failure), NYHA class 1 (HCC)   Dyspnea   ESRD (end stage renal disease) (HCC)   Fluid overload   Chest pain  Acute hypoxic respiratory failure secondary to pulmonary edema from ESRD with inadequate volume removal.  His dry weight was supposed to have been lowered at his outpatient HD center, however, his weight has remained unchanged since the last time he was discharged.  -  continuous to improve with daily HD. Possible d/c tomorrow after HD  Regurgitation of food/nausea, may be due to gastroparesis, gastric edema -  Esophagram demonstrated only mild reflux, no obstruction -  Trial of reglan > increase to 10mg  before meals and add before bed also -  KUB to r/o obstruction or constipation -  Start miralax and senna  Chronic diastolic heart failure, volume managed by hemodialysis  Hypertension, blood pressures improving with HD -  Continue volume removal at HD -  Continue norvasc, carvedilol -   Increased hydralazine  Diabetes mellitus type 2, A1c 5.6 on 6/7. On ISS  Anemia of chronic renal disease, no need for blood transfusion  DVT prophylaxis:  lovenox Code Status:  full Family Communication:  Patient alone  Disposition Plan:  Possible d/c home tomorrow after HD   Consultants:   Nephrology  Procedures:  HD  Antimicrobials:   none    Subjective: Ongoing nausea and regurgitation of food, particularly after meals.  Had vomiting overnight last night.  Early satiety.  Still SOB.    Objective: Filed Vitals:   03/26/16 1000 03/26/16 1030 03/26/16 1046 03/26/16 1100  BP: 128/51 120/70 115/61 130/69  Pulse: 87 88 88 89  Temp:   97.7 F (36.5 C) 98.4 F (36.9 C)  TempSrc:   Oral Oral  Resp:   20 17  Height:      Weight:   88.6 kg (195 lb 5.2 oz)   SpO2:   99% 100%    Intake/Output Summary (Last 24 hours) at 03/26/16 1239 Last data filed at 03/26/16 1046  Gross per 24 hour  Intake    603 ml  Output   2488 ml  Net  -1885 ml   Filed Weights   03/26/16 0409 03/26/16 0713 03/26/16 1046  Weight: 87.317 kg (192 lb 8 oz) 90.9 kg (200 lb 6.4 oz) 88.6 kg (195 lb 5.2 oz)    Examination:  General exam:  Adult male.  No acute distress.  Nasal cannula in place HEENT:  NCAT, MMM Respiratory system:  Wheezing with some faint rales,  no rhonchi Cardiovascular system: Regular rate and rhythm, referred murmur/bruit from left arm. No rubs, gallops or clicks.  Warm extremities Gastrointestinal system: Normal active bowel sounds, soft, mildly distended, nontender MSK:  Normal tone and bulk, no lower extremity edema Neuro:  Grossly intact    Data Reviewed: I have personally reviewed following labs and imaging studies  CBC:  Recent Labs Lab 03/23/16 2212 03/24/16 1015 03/24/16 1504 03/26/16 0730  WBC 7.1 9.1 10.4 6.4  NEUTROABS  --   --   --  5.0  HGB 9.9* 10.4* 10.8* 9.8*  HCT 33.5* 33.9* 36.2* 33.0*  MCV 79.4 77.9* 79.4 79.5  PLT 146* 143* 142* 134*    Basic Metabolic Panel:  Recent Labs Lab 03/23/16 2212 03/24/16 1015 03/24/16 1504 03/26/16 0730  NA 140 138 135 134*  K 3.9 4.3 4.1 5.1  CL 100* 99* 98* 98*  CO2 26 24 24 25   GLUCOSE 94 132* 83 92  BUN 41* 46* 19 44*  CREATININE 8.10* 8.65* 4.75* 8.53*  CALCIUM 9.7 9.9 9.4 9.4  MG  --   --  1.8  --   PHOS  --   --  4.8* 8.6*   GFR: Estimated Creatinine Clearance: 10.4 mL/min (by C-G formula based on Cr of 8.53). Liver Function Tests:  Recent Labs Lab 03/23/16 2212 03/24/16 1504 03/26/16 0730  AST 11* 13*  --   ALT 11* 10*  --   ALKPHOS 78 78  --   BILITOT 0.8 1.4*  --   PROT 7.0 7.8  --   ALBUMIN 3.3* 3.8 3.0*   No results for input(s): LIPASE, AMYLASE in the last 168 hours. No results for input(s): AMMONIA in the last 168 hours. Coagulation Profile:  Recent Labs Lab 03/23/16 2212  INR 1.35   Cardiac Enzymes:  Recent Labs Lab 03/24/16 0656 03/24/16 1504  TROPONINI 0.04* 0.04*   BNP (last 3 results) No results for input(s): PROBNP in the last 8760 hours. HbA1C:  Recent Labs  03/24/16 1504  HGBA1C 5.4   CBG:  Recent Labs Lab 03/25/16 0734 03/25/16 1222 03/25/16 1622 03/25/16 2151 03/26/16 1203  GLUCAP 108* 120* 91 92 148*   Lipid Profile: No results for input(s): CHOL, HDL, LDLCALC, TRIG, CHOLHDL, LDLDIRECT in the last 72 hours. Thyroid Function Tests:  Recent Labs  03/24/16 1504  TSH 0.674   Anemia Panel: No results for input(s): VITAMINB12, FOLATE, FERRITIN, TIBC, IRON, RETICCTPCT in the last 72 hours. Urine analysis:    Component Value Date/Time   COLORURINE YELLOW 12/23/2015 1332   APPEARANCEUR CLEAR 12/23/2015 1332   LABSPEC 1.014 12/23/2015 1332   PHURINE 8.0 12/23/2015 1332   GLUCOSEU 100* 12/23/2015 1332   HGBUR MODERATE* 12/23/2015 1332   BILIRUBINUR NEGATIVE 12/23/2015 1332   KETONESUR NEGATIVE 12/23/2015 1332   PROTEINUR >300* 12/23/2015 1332   UROBILINOGEN 0.2 02/22/2015 2343   NITRITE NEGATIVE 12/23/2015  1332   LEUKOCYTESUR NEGATIVE 12/23/2015 1332   Sepsis Labs: @LABRCNTIP (procalcitonin:4,lacticidven:4)  )No results found for this or any previous visit (from the past 240 hour(s)).    Radiology Studies: Dg Chest 1v Repeat Same Day  03/24/2016  CLINICAL DATA:  Pulmonary edema. EXAM: CHEST - 1 VIEW SAME DAY COMPARISON:  03/23/2016 FINDINGS: The cardiac silhouette is mildly enlarged. Mediastinal contours appear intact. There is no evidence of pneumothorax. There are diffusely increased interstitial markings bilaterally, most suggestive of interstitial pulmonary edema. No definite pleural effusions. Osseous structures are without acute abnormality. Soft tissues are grossly normal. IMPRESSION: Worsening of  interstitial pulmonary edema. Mild cardiomegaly. Electronically Signed   By: Fidela Salisbury M.D.   On: 03/24/2016 17:34   Dg Abd Portable 1v  03/24/2016  CLINICAL DATA:  Pulmonary edema.  Possible gastroparesis. EXAM: PORTABLE ABDOMEN - 1 VIEW COMPARISON:  12/23/15 FINDINGS: Oral contrast from modified swallow earlier today is in distal small bowel and possibly proximal large bowel. No dilated loops. IMPRESSION: Negative. Electronically Signed   By: Skipper Cliche M.D.   On: 03/24/2016 17:37     Scheduled Meds: . amitriptyline  100 mg Oral QHS  . amLODipine  10 mg Oral Daily  . aspirin  81 mg Oral Daily  . atorvastatin  20 mg Oral QHS  . carvedilol  25 mg Oral BID WC  . cinacalcet  90 mg Oral QHS  . [START ON 03/27/2016] doxercalciferol  7 mcg Intravenous Q T,Th,Sa-HD  . enoxaparin (LOVENOX) injection  30 mg Subcutaneous Q24H  . hydrALAZINE  50 mg Oral TID  . insulin aspart  0-9 Units Subcutaneous TID WC  . losartan  100 mg Oral QHS  . metoCLOPramide  10 mg Oral TID AC & HS  . multivitamin  1 tablet Oral QHS  . polyethylene glycol  17 g Oral Daily  . pregabalin  50 mg Oral Daily  . senna  2 tablet Oral QHS  . sevelamer carbonate  3,200 mg Oral TID WC  . sodium chloride flush  3  mL Intravenous Q12H  . sodium chloride flush  3 mL Intravenous Q12H  . venlafaxine XR  75 mg Oral Q breakfast   Continuous Infusions:    LOS: 2 days    Time spent: 30 min    Kinnie Feil, MD [facility] Pager [phone] If 7PM-7AM, please contact night-coverage [email or web address] Password [facility] 03/26/2016, 12:39 PM

## 2016-03-26 NOTE — Procedures (Signed)
I have personally attended this patient's dialysis session.   2K2Ca 2.2 liters off so far and no cramping L AVF no issues  Jamal Maes, MD [place] Pager 03/26/2016, 10:05 AM

## 2016-03-26 NOTE — Progress Notes (Signed)
[facility] Progress Note   Subjective:  2.1 kg off with last HD In HD today - 2 weights from this AM 87.3 and 90.9 (am inclined to believe the latter - last post HD weight was 89.5 kg) N/V better - eating on HD  Filed Vitals:   03/26/16 0409 03/26/16 0713 03/26/16 0730 03/26/16 0800  BP:  123/82 127/83 118/73  Pulse:  79 77 81  Temp:  96.9 F (36.1 C)    TempSrc:  Oral    Resp:  19    Height:      Weight: 87.317 kg (192 lb 8 oz) 90.9 kg (200 lb 6.4 oz)    SpO2:  100%     Exam: Comfortable On HD with no cramping and only 52 minutes to go No JVD Lungs ant clear S1S2 No s3 Abd soft and NT No edema of LE's L AVF in use for HD  Inpatient medications: . amitriptyline  100 mg Oral QHS  . amLODipine  10 mg Oral Daily  . aspirin  81 mg Oral Daily  . atorvastatin  20 mg Oral QHS  . carvedilol  25 mg Oral BID WC  . cinacalcet  90 mg Oral QHS  . enoxaparin (LOVENOX) injection  30 mg Subcutaneous Q24H  . [START ON 03/27/2016] heparin  6,000 Units Dialysis Once in dialysis  . hydrALAZINE  50 mg Oral TID  . insulin aspart  0-9 Units Subcutaneous TID WC  . losartan  100 mg Oral QHS  . metoCLOPramide  10 mg Oral TID AC & HS  . multivitamin  1 tablet Oral QHS  . polyethylene glycol  17 g Oral Daily  . pregabalin  50 mg Oral Daily  . senna  2 tablet Oral QHS  . sevelamer carbonate  3,200 mg Oral TID WC  . sodium chloride flush  3 mL Intravenous Q12H  . sodium chloride flush  3 mL Intravenous Q12H  . venlafaxine XR  75 mg Oral Q breakfast     sodium chloride, sodium chloride, sodium chloride, acetaminophen **OR** acetaminophen, albuterol, alteplase, heparin, hydrALAZINE, HYDROcodone-acetaminophen, lidocaine (PF), lidocaine-prilocaine, ondansetron **OR** ondansetron (ZOFRAN) IV, pentafluoroprop-tetrafluoroeth, sodium chloride flush    Recent Labs Lab 03/24/16 1015 03/24/16 1504 03/26/16 0730  NA 138 135 134*  K 4.3 4.1 5.1  CL 99* 98* 98*  CO2 24 24 25    GLUCOSE 132* 83 92  BUN 46* 19 44*  CREATININE 8.65* 4.75* 8.53*  CALCIUM 9.9 9.4 9.4  PHOS  --  4.8* 8.6*    Recent Labs Lab 03/23/16 2212 03/24/16 1504 03/26/16 0730  AST 11* 13*  --   ALT 11* 10*  --   ALKPHOS 78 78  --   BILITOT 0.8 1.4*  --   PROT 7.0 7.8  --   ALBUMIN 3.3* 3.8 3.0*    Recent Labs Lab 03/24/16 1015 03/24/16 1504 03/26/16 0730  WBC 9.1 10.4 6.4  NEUTROABS  --   --  5.0  HGB 10.4* 10.8* 9.8*  HCT 33.9* 36.2* 33.0*  MCV 77.9* 79.4 79.5  PLT 143* 142* 134*    Dialysis: [facility]  4h  500/800  2/2 bath  P2  Hep 8000   88.5kg Doesn't usually make dry wt Recent course IV Fe Hect 7 ug Mirc 150 last 6/13 Lab - Hb 11, tfs 11%  ferr 1600  pth 475  P 7-10      Assessment: 1. Chest pain/ SOB/ pulm edema - better. Doesn't tolerate large UF  due to cramping. Today no cramping and 2250 off so far! Will see what post weight looks like today on standing scales. 2. ESRD TTS [facility]. Extra treatment today for volume. Has not been getting to EDW as outpt and did not get to w/TMT here on 6/17 either. Regularly scheduled tmt tomorrow. 3. Hx CAD 4. HTN coreg/ losart/ hydral/ amlod 5. Anemia - Hb down to 9.8. Last Mircera was 6/13. S/p course Fe as outpt w/HD 6. MBD sensipar/sevelamer/Hectorol 7. DM per primary service 8. Nausea/vomiting - now on Reglan with marked improvement N/V  9. Disposition - not sure of discharge plans. In case still here tomorrow, will write for 1st shift HD  Jamal Maes, MD [place] Pager 03/26/2016, 9:46 AM

## 2016-03-27 DIAGNOSIS — R0600 Dyspnea, unspecified: Secondary | ICD-10-CM

## 2016-03-27 DIAGNOSIS — N186 End stage renal disease: Secondary | ICD-10-CM

## 2016-03-27 LAB — RENAL FUNCTION PANEL
ANION GAP: 11 (ref 5–15)
Albumin: 3.1 g/dL — ABNORMAL LOW (ref 3.5–5.0)
BUN: 30 mg/dL — ABNORMAL HIGH (ref 6–20)
CALCIUM: 9.3 mg/dL (ref 8.9–10.3)
CHLORIDE: 97 mmol/L — AB (ref 101–111)
CO2: 27 mmol/L (ref 22–32)
Creatinine, Ser: 6.84 mg/dL — ABNORMAL HIGH (ref 0.61–1.24)
GFR calc non Af Amer: 8 mL/min — ABNORMAL LOW (ref >60)
GFR, EST AFRICAN AMERICAN: 9 mL/min — AB (ref >60)
GLUCOSE: 80 mg/dL (ref 65–99)
Phosphorus: 6.7 mg/dL — ABNORMAL HIGH (ref 2.5–4.6)
Potassium: 4.6 mmol/L (ref 3.5–5.1)
SODIUM: 135 mmol/L (ref 135–145)

## 2016-03-27 LAB — CBC
HCT: 34.4 % — ABNORMAL LOW (ref 39.0–52.0)
HEMOGLOBIN: 10.1 g/dL — AB (ref 13.0–17.0)
MCH: 23.9 pg — AB (ref 26.0–34.0)
MCHC: 29.4 g/dL — AB (ref 30.0–36.0)
MCV: 81.5 fL (ref 78.0–100.0)
Platelets: 143 10*3/uL — ABNORMAL LOW (ref 150–400)
RBC: 4.22 MIL/uL (ref 4.22–5.81)
RDW: 20.3 % — ABNORMAL HIGH (ref 11.5–15.5)
WBC: 6.3 10*3/uL (ref 4.0–10.5)

## 2016-03-27 LAB — GLUCOSE, CAPILLARY: Glucose-Capillary: 80 mg/dL (ref 65–99)

## 2016-03-27 MED ORDER — ACETAMINOPHEN 325 MG PO TABS: 650.0000 mg | ORAL_TABLET | Freq: Four times a day (QID) | ORAL | Status: DC | PRN

## 2016-03-27 MED ORDER — DOXERCALCIFEROL 4 MCG/2ML IV SOLN
INTRAVENOUS | Status: AC
  Filled 2016-03-27: qty 4

## 2016-03-27 NOTE — Procedures (Signed)
I have personally attended this patient's dialysis session.   Pt now at EDW L AVF no issues w/cannulation BP stable Back on usual TTS schedule  Jamal Maes, MD [place] Pager 03/27/2016, 10:41 AM

## 2016-03-27 NOTE — Progress Notes (Signed)
Pt discharged home. Discharge instructions have been gone over with the patient. IV's removed. Pt given unit number and told to call if they have any concerns regarding their discharge instructions. Caryl Manas V, RN   

## 2016-03-27 NOTE — Progress Notes (Signed)
[facility] Progress Note   Subjective:  HD again today to get back on usual schedule Says he "feels just fine" No more nausea or vomiting EDW is 88.5 Pre weight 88.6   Filed Vitals:   03/27/16 0900 03/27/16 0929 03/27/16 1000 03/27/16 1030  BP: 115/57 129/62 108/58 129/63  Pulse: 87 82 84 86  Temp:      TempSrc:      Resp:      Height:      Weight:      SpO2:       Exam: Comfortable On HD - felling "fine" No JVD Lungs ant clear S1S2 No s3 Abd soft and NT No edema of LE's Large aneurysmal L AVF in use for HD  Inpatient medications: . amitriptyline  100 mg Oral QHS  . amLODipine  10 mg Oral Daily  . aspirin  81 mg Oral Daily  . atorvastatin  20 mg Oral QHS  . carvedilol  25 mg Oral BID WC  . cinacalcet  90 mg Oral QHS  . doxercalciferol      . doxercalciferol  7 mcg Intravenous Q T,Th,Sa-HD  . enoxaparin (LOVENOX) injection  30 mg Subcutaneous Q24H  . hydrALAZINE  50 mg Oral TID  . insulin aspart  0-9 Units Subcutaneous TID WC  . losartan  100 mg Oral QHS  . metoCLOPramide  10 mg Oral TID AC & HS  . multivitamin  1 tablet Oral QHS  . polyethylene glycol  17 g Oral Daily  . pregabalin  50 mg Oral Daily  . senna  2 tablet Oral QHS  . sevelamer carbonate  3,200 mg Oral TID WC  . sodium chloride flush  3 mL Intravenous Q12H  . sodium chloride flush  3 mL Intravenous Q12H  . venlafaxine XR  75 mg Oral Q breakfast     sodium chloride, acetaminophen **OR** acetaminophen, albuterol, hydrALAZINE, HYDROcodone-acetaminophen, ondansetron **OR** ondansetron (ZOFRAN) IV, sodium chloride flush    Recent Labs Lab 03/24/16 1504 03/26/16 0730 03/27/16 0731  NA 135 134* 135  K 4.1 5.1 4.6  CL 98* 98* 97*  CO2 24 25 27   GLUCOSE 83 92 80  BUN 19 44* 30*  CREATININE 4.75* 8.53* 6.84*  CALCIUM 9.4 9.4 9.3  PHOS 4.8* 8.6* 6.7*    Recent Labs Lab 03/23/16 2212 03/24/16 1504 03/26/16 0730 03/27/16 0731  AST 11* 13*  --   --   ALT 11* 10*  --    --   ALKPHOS 78 78  --   --   BILITOT 0.8 1.4*  --   --   PROT 7.0 7.8  --   --   ALBUMIN 3.3* 3.8 3.0* 3.1*    Recent Labs Lab 03/24/16 1504 03/26/16 0730 03/27/16 0731  WBC 10.4 6.4 6.3  NEUTROABS  --  5.0  --   HGB 10.8* 9.8* 10.1*  HCT 36.2* 33.0* 34.4*  MCV 79.4 79.5 81.5  PLT 142* 134* 143*    Dialysis: [facility]  4h  500/800  2/2 bath  P2  Hep 8000   88.5kg Doesn't usually make dry wt Recent course IV Fe Hect 7 ug Mirc 150 last 6/13 Lab - Hb 11, tfs 11%  ferr 1600  pth 475  P 7-10      Assessment: 1. Chest pain/ SOB/ pulm edema - better. Doesn't tolerate large UF due to cramping. With 2 treatments in a row he is now at his EDW of 88.5 kg.  2.  ESRD TTS [facility]. HD today on schedule (after extra tmt yesterday). Now at EDW. His job will be to keep gains low. 3. Hx CAD 4. HTN coreg/ losart/ hydral/ amlod 5. Anemia - Hb 10.1. Last Mircera was 6/13. S/p course Fe as outpt w/HD 6. MBD sensipar/sevelamer/Hectorol 7. DM per primary service 8. Nausea/vomiting - now on Reglan with marked improvement N/V  9. Disposition -? Discharge to home after HD?  Jamal Maes, MD [facility] [phone] Pager 03/27/2016, 10:37 AM

## 2016-03-27 NOTE — Discharge Summary (Signed)
Physician Discharge Summary  [name] [name] [id number] DOB: Feb 22, 1956 DOA: 03/23/2016  PCP: [name]  Admit date: 03/23/2016 Discharge date: 03/27/2016  Time spent: >35 minutes  Recommendations for Outpatient Follow-up:  F/u with HD as scheduled F/u with PCP in 1-2 weeks as needed   Discharge Diagnoses:  Active Problems:   Hypertension   CAD -S/P LAD BMS 2011, LAD DES 2012- patent cors Feb 2016   Diabetes mellitus type 2, diet-controlled ([place])   Acute on chronic diastolic CHF (congestive heart failure), NYHA class 1 (HCC)   Dyspnea   ESRD (end stage renal disease) ([place])   Fluid overload   Chest pain   Discharge Condition: stable   Diet recommendation: renal   Filed Weights   03/27/16 0400 03/27/16 0700 03/27/16 1110  Weight: 88.451 kg (195 lb) 88.6 kg (195 lb 5.2 oz) 88.9 kg (195 lb 15.8 oz)    History of present illness:  [name] [name] is a 60 y.o. male with medical history significant of end-stage renal disease and hemodialysis Thursday Thursday Saturday at [facility] dialysis [facility], diabetes 2, diastolic heart failure, CAD, HLD. Presented with recurrent chest pain in the right side and shortness of breath. Pain is coming and going, feels sharp and like pressure, same as previous episodes. Having regurgitation and vomiting of food. Also has progressive worsening shortness of breath.  -Previous symptoms improved with volume removal with HD. Chest x-ray showed vascular congestion. Had HD, but still volume overloaded and symptomatic on admission  Hospital Course:   1. Acute hypoxic respiratory failure secondary to pulmonary edema from ESRD with inadequate volume removal. His dry weight was supposed to have been lowered at his outpatient HD center, however, his weight has remained unchanged since the last time he was discharged.  -Pt is improved with daily HD.   2. Regurgitation of food/nausea, may be due to gastroparesis, gastric edema. Esophagram  demonstrated only mild reflux, no obstruction. KUB to r/o obstruction or constipation -symptoms have resolved   3. Chronic diastolic heart failure, volume managed by hemodialysis  4. Hypertension, blood pressures improving with HD. continue norvasc, carvedilol, hydralazine  5. Anemia of chronic renal disease, no need for blood transfusion  Procedures:  HD (i.e. Studies not automatically included, echos, thoracentesis, etc; not x-rays)  Consultations:  Nephrology   Discharge Exam: Filed Vitals:   03/27/16 1110 03/27/16 1228  BP: 131/76 131/82  Pulse: 90 91  Temp: 97.6 F (36.4 C) 97.7 F (36.5 C)  Resp:      General: alert, no distress Cardiovascular:  S1,s2 rrr Respiratory: CTA BL   Discharge Instructions  Discharge Instructions    Diet - low sodium heart healthy    Complete by:  As directed      Discharge instructions    Complete by:  As directed   Please follow up with dialysis as scheduled     Increase activity slowly    Complete by:  As directed             Medication List    STOP taking these medications        losartan 100 MG tablet  Commonly known as:  COZAAR      TAKE these medications        acetaminophen 325 MG tablet  Commonly known as:  TYLENOL  Take 2 tablets (650 mg total) by mouth every 6 (six) hours as needed for mild pain (or Fever >/= 101).     albuterol 108 (90 Base) MCG/ACT inhaler  Commonly known as:  PROVENTIL HFA;VENTOLIN HFA  Inhale 1-2 puffs into the lungs every 6 (six) hours as needed for wheezing or shortness of breath.     amitriptyline 100 MG tablet  Commonly known as:  ELAVIL  Take 1 tablet (100 mg total) by mouth at bedtime.     amLODipine 10 MG tablet  Commonly known as:  NORVASC  Take 1 tablet (10 mg total) by mouth daily.     aspirin 81 MG chewable tablet  Chew 1 tablet (81 mg total) by mouth daily.     atorvastatin 20 MG tablet  Commonly known as:  LIPITOR  Take 20 mg by mouth at bedtime. Reported on  03/14/2016     carvedilol 25 MG tablet  Commonly known as:  COREG  Take 1 tablet (25 mg total) by mouth 2 (two) times daily with a meal.     cinacalcet 90 MG tablet  Commonly known as:  SENSIPAR  Take 90 mg by mouth at bedtime.     hydrALAZINE 25 MG tablet  Commonly known as:  APRESOLINE  Take 1 tablet (25 mg total) by mouth 3 (three) times daily.     multivitamin Tabs tablet  Take 1 tablet by mouth at bedtime.     pregabalin 50 MG capsule  Commonly known as:  LYRICA  Take 1 capsule (50 mg total) by mouth daily.     sevelamer carbonate 800 MG tablet  Commonly known as:  RENVELA  Take 4 tablets (3,200 mg total) by mouth 3 (three) times daily with meals.     venlafaxine XR 75 MG 24 hr capsule  Commonly known as:  EFFEXOR XR  Take 1 capsule (75 mg total) by mouth daily with breakfast.       Allergies  Allergen Reactions  . Enalapril Hives      The results of significant diagnostics from this hospitalization (including imaging, microbiology, ancillary and laboratory) are listed below for reference.    Significant Diagnostic Studies: Dg Chest 2 View  03/17/2016  CLINICAL DATA:  Acute onset of central chest pain and nausea. Shortness of breath. Generalized weakness. Initial encounter. EXAM: CHEST  2 VIEW COMPARISON:  Chest radiograph performed 03/11/2016 FINDINGS: The lungs are well-aerated. There is mild elevation of the left hemidiaphragm. Vascular congestion is noted. Patchy bilateral airspace opacity, with sparing at the upper lung zones, may reflect pneumonia or pulmonary edema. No pleural effusion or pneumothorax is seen. The heart is borderline normal in size. No acute osseous abnormalities are seen. IMPRESSION: Vascular congestion noted. Patchy bilateral airspace opacity, with sparing at the upper lung zones, may reflect pneumonia or pulmonary edema. Electronically Signed   By: Garald Balding M.D.   On: 03/17/2016 21:40   Dg Chest 2 View  03/11/2016  CLINICAL DATA:  Mid  chest pain off and on for 2 years. Hypertension. EXAM: CHEST  2 VIEW COMPARISON:  03/09/2016 FINDINGS: Cardiomegaly with vascular congestion. Diffuse bilateral airspace opacities, likely mild edema. This is slightly improved since prior study. Mild peribronchial thickening. No effusions or acute bony abnormality. IMPRESSION: Cardiomegaly with mild edema/ CHF, slightly improved since prior study. Electronically Signed   By: Rolm Baptise M.D.   On: 03/11/2016 15:48   Dg Chest 2 View  03/07/2016  CLINICAL DATA:  Central chest pain for 2 days EXAM: CHEST  2 VIEW COMPARISON:  02/19/2016 FINDINGS: Cardiac shadow is stable. Interstitial changes are again identified which may represent a degree of interstitial edema. No focal confluent infiltrate is  seen. The overall appearance is stable from the prior study. IMPRESSION: No significant interval change from the prior exam. Electronically Signed   By: Inez Catalina M.D.   On: 03/07/2016 12:51   Dg Esophagus  03/24/2016  CLINICAL DATA:  60 year old male presents from the emergency department for evaluation of progressive dysphagia with food regurgitation. EXAM: ESOPHOGRAM/BARIUM SWALLOW TECHNIQUE: Single contrast examination was performed using  thin barium. FLUOROSCOPY TIME:  Fluoroscopy Time:  2 minutes 36 seconds. Number of Acquired Images: 3 exposures (remaining images are screen or cine captures). COMPARISON:  None. FINDINGS: The oral and pharyngeal phases of swallowing are normal, with no laryngeal penetration or tracheobronchial aspiration. No significant barium retention in the pharynx. No evidence of pharyngeal mass, stricture or diverticulum. There is mild cricopharyngeus muscle dysfunction characterized by delayed opening of the cricopharyngeus muscle. There is mild esophageal dysmotility, characterized by mild intermittent weakening of primary peristalsis in the mid to lower thoracic esophagus. No hiatal hernia. Mild gastroesophageal reflux to the level of the  lower thoracic esophagus. Normal esophageal distensibility. No evidence of esophageal mass, ulcer or stricture. A swallowed 13 mm barium tablet traversed the esophagus into the stomach without delay. IMPRESSION: 1. Normal oral and pharyngeal phases of swallowing, with no laryngeal penetration or tracheobronchial aspiration. 2. Mild gastroesophageal reflux.  No hiatal hernia. 3. Mild esophageal dysmotility and cricopharyngeus muscle dysfunction, characteristic of chronic gastroesophageal reflux disease. 4. No evidence of esophageal mass, ulcer or stricture. Electronically Signed   By: Ilona Sorrel M.D.   On: 03/24/2016 09:58   Nm Pulmonary Perf And Vent  03/11/2016  CLINICAL DATA:  Shortness of Breath EXAM: NUCLEAR MEDICINE VENTILATION - PERFUSION LUNG SCAN TECHNIQUE: Ventilation images were obtained in multiple projections using inhaled aerosol Tc-54m DTPA. Perfusion images were obtained in multiple projections after intravenous injection of Tc-59m MAA. RADIOPHARMACEUTICALS:  32.2 mCi Technetium-46m DTPA aerosol inhalation and 4.35 mCi Technetium-106m MAA IV COMPARISON:  Chest x-ray 03/09/2016 FINDINGS: Ventilation: No focal ventilation defect. Perfusion: No wedge shaped peripheral perfusion defects to suggest acute pulmonary embolism. IMPRESSION: No evidence of pulmonary embolus. Electronically Signed   By: Rolm Baptise M.D.   On: 03/11/2016 14:01   Dg Chest 1v Repeat Same Day  03/24/2016  CLINICAL DATA:  Pulmonary edema. EXAM: CHEST - 1 VIEW SAME DAY COMPARISON:  03/23/2016 FINDINGS: The cardiac silhouette is mildly enlarged. Mediastinal contours appear intact. There is no evidence of pneumothorax. There are diffusely increased interstitial markings bilaterally, most suggestive of interstitial pulmonary edema. No definite pleural effusions. Osseous structures are without acute abnormality. Soft tissues are grossly normal. IMPRESSION: Worsening of interstitial pulmonary edema. Mild cardiomegaly. Electronically  Signed   By: Fidela Salisbury M.D.   On: 03/24/2016 17:34   Dg Chest Portable 1 View  03/23/2016  CLINICAL DATA:  Shortness of breath and substernal chest pain today. EXAM: PORTABLE CHEST 1 VIEW COMPARISON:  Multiple prior exams most recently radiographs 03/17/2016. Chest CT 09/18/2015 FINDINGS: Stable cardiomegaly. Vascular congestion again seen. Worsening opening consistent with edema. No confluent airspace disease. No evidence of pleural effusion. No pneumothorax. IMPRESSION: Stable cardiomegaly and vascular congestion. Worsening pulmonary edema, progressive CHF. Electronically Signed   By: Jeb Levering M.D.   On: 03/23/2016 22:33   Dg Chest Portable 1 View  03/09/2016  CLINICAL DATA:  Acute onset of shortness of breath and cough. Initial encounter. EXAM: PORTABLE CHEST 1 VIEW COMPARISON:  Chest radiograph performed 03/07/2016 FINDINGS: There is elevation of the left hemidiaphragm. Vascular congestion is noted. Increased interstitial markings  raise concern for pulmonary edema. No definite pleural effusion or pneumothorax is seen. The cardiomediastinal silhouette is mildly enlarged. No acute osseous abnormalities are identified. IMPRESSION: Elevation of the left hemidiaphragm. Vascular congestion and mild cardiomegaly. Increased interstitial markings raise concern for pulmonary edema. Electronically Signed   By: Garald Balding M.D.   On: 03/09/2016 23:38   Dg Abd Portable 1v  03/24/2016  CLINICAL DATA:  Pulmonary edema.  Possible gastroparesis. EXAM: PORTABLE ABDOMEN - 1 VIEW COMPARISON:  12/23/15 FINDINGS: Oral contrast from modified swallow earlier today is in distal small bowel and possibly proximal large bowel. No dilated loops. IMPRESSION: Negative. Electronically Signed   By: Skipper Cliche M.D.   On: 03/24/2016 17:37    Microbiology: No results found for this or any previous visit (from the past 240 hour(s)).   Labs: Basic Metabolic Panel:  Recent Labs Lab 03/23/16 2212  03/24/16 1015 03/24/16 1504 03/26/16 0730 03/27/16 0731  NA 140 138 135 134* 135  K 3.9 4.3 4.1 5.1 4.6  CL 100* 99* 98* 98* 97*  CO2 26 24 24 25 27   GLUCOSE 94 132* 83 92 80  BUN 41* 46* 19 44* 30*  CREATININE 8.10* 8.65* 4.75* 8.53* 6.84*  CALCIUM 9.7 9.9 9.4 9.4 9.3  MG  --   --  1.8  --   --   PHOS  --   --  4.8* 8.6* 6.7*   Liver Function Tests:  Recent Labs Lab 03/23/16 2212 03/24/16 1504 03/26/16 0730 03/27/16 0731  AST 11* 13*  --   --   ALT 11* 10*  --   --   ALKPHOS 78 78  --   --   BILITOT 0.8 1.4*  --   --   PROT 7.0 7.8  --   --   ALBUMIN 3.3* 3.8 3.0* 3.1*   No results for input(s): LIPASE, AMYLASE in the last 168 hours. No results for input(s): AMMONIA in the last 168 hours. CBC:  Recent Labs Lab 03/23/16 2212 03/24/16 1015 03/24/16 1504 03/26/16 0730 03/27/16 0731  WBC 7.1 9.1 10.4 6.4 6.3  NEUTROABS  --   --   --  5.0  --   HGB 9.9* 10.4* 10.8* 9.8* 10.1*  HCT 33.5* 33.9* 36.2* 33.0* 34.4*  MCV 79.4 77.9* 79.4 79.5 81.5  PLT 146* 143* 142* 134* 143*   Cardiac Enzymes:  Recent Labs Lab 03/24/16 0656 03/24/16 1504  TROPONINI 0.04* 0.04*   BNP: BNP (last 3 results)  Recent Labs  01/19/16 1334 02/19/16 2050 03/10/16 0058  BNP 866.3* 1970.6* 810.9*    ProBNP (last 3 results) No results for input(s): PROBNP in the last 8760 hours.  CBG:  Recent Labs Lab 03/25/16 2151 03/26/16 1203 03/26/16 1705 03/26/16 2348 03/27/16 1226  GLUCAP 92 148* 96 97 80       Signed:  Mackensie Pilson N  [facility] 03/27/2016, 1:20 PM

## 2016-03-29 DIAGNOSIS — E119 Type 2 diabetes mellitus without complications: Secondary | ICD-10-CM | POA: Diagnosis not present

## 2016-03-29 DIAGNOSIS — D631 Anemia in chronic kidney disease: Secondary | ICD-10-CM | POA: Diagnosis not present

## 2016-03-29 DIAGNOSIS — N186 End stage renal disease: Secondary | ICD-10-CM | POA: Diagnosis not present

## 2016-03-29 DIAGNOSIS — N2581 Secondary hyperparathyroidism of renal origin: Secondary | ICD-10-CM | POA: Diagnosis not present

## 2016-03-31 DIAGNOSIS — N186 End stage renal disease: Secondary | ICD-10-CM | POA: Diagnosis not present

## 2016-03-31 DIAGNOSIS — D631 Anemia in chronic kidney disease: Secondary | ICD-10-CM | POA: Diagnosis not present

## 2016-03-31 DIAGNOSIS — N2581 Secondary hyperparathyroidism of renal origin: Secondary | ICD-10-CM | POA: Diagnosis not present

## 2016-03-31 DIAGNOSIS — E119 Type 2 diabetes mellitus without complications: Secondary | ICD-10-CM | POA: Diagnosis not present

## 2016-04-02 ENCOUNTER — Ambulatory Visit: Payer: Medicare Other | Admitting: Pharmacist

## 2016-04-02 ENCOUNTER — Ambulatory Visit (INDEPENDENT_AMBULATORY_CARE_PROVIDER_SITE_OTHER): Payer: Medicare Other | Admitting: Internal Medicine

## 2016-04-02 ENCOUNTER — Telehealth: Payer: Self-pay | Admitting: *Deleted

## 2016-04-02 ENCOUNTER — Encounter: Payer: Self-pay | Admitting: Internal Medicine

## 2016-04-02 ENCOUNTER — Ambulatory Visit: Payer: Medicare Other | Admitting: Family Medicine

## 2016-04-02 VITALS — BP 182/104 | HR 100 | Temp 97.7°F | Wt 207.4 lb

## 2016-04-02 DIAGNOSIS — E1122 Type 2 diabetes mellitus with diabetic chronic kidney disease: Secondary | ICD-10-CM | POA: Diagnosis not present

## 2016-04-02 DIAGNOSIS — R112 Nausea with vomiting, unspecified: Secondary | ICD-10-CM | POA: Insufficient documentation

## 2016-04-02 DIAGNOSIS — N186 End stage renal disease: Secondary | ICD-10-CM

## 2016-04-02 DIAGNOSIS — Z9114 Patient's other noncompliance with medication regimen: Secondary | ICD-10-CM

## 2016-04-02 DIAGNOSIS — E119 Type 2 diabetes mellitus without complications: Secondary | ICD-10-CM

## 2016-04-02 DIAGNOSIS — I120 Hypertensive chronic kidney disease with stage 5 chronic kidney disease or end stage renal disease: Secondary | ICD-10-CM | POA: Diagnosis not present

## 2016-04-02 DIAGNOSIS — I10 Essential (primary) hypertension: Secondary | ICD-10-CM | POA: Diagnosis not present

## 2016-04-02 DIAGNOSIS — Z79899 Other long term (current) drug therapy: Secondary | ICD-10-CM

## 2016-04-02 HISTORY — DX: Nausea with vomiting, unspecified: R11.2

## 2016-04-02 LAB — BASIC METABOLIC PANEL
ANION GAP: 18 — AB (ref 5–15)
BUN: 60 mg/dL — AB (ref 6–20)
CALCIUM: 10 mg/dL (ref 8.9–10.3)
CO2: 23 mmol/L (ref 22–32)
Chloride: 96 mmol/L — ABNORMAL LOW (ref 101–111)
Creatinine, Ser: 9.99 mg/dL — ABNORMAL HIGH (ref 0.61–1.24)
GFR calc Af Amer: 6 mL/min — ABNORMAL LOW (ref >60)
GFR calc non Af Amer: 5 mL/min — ABNORMAL LOW (ref >60)
GLUCOSE: 103 mg/dL — AB (ref 65–99)
Potassium: 4 mmol/L (ref 3.5–5.1)
Sodium: 137 mmol/L (ref 135–145)

## 2016-04-02 MED ORDER — AMLODIPINE BESYLATE 2.5 MG PO TABS: 10.0000 mg | ORAL_TABLET | Freq: Once | ORAL | Status: DC

## 2016-04-02 MED ORDER — ESOMEPRAZOLE MAGNESIUM 20 MG PO CPDR: 20.0000 mg | DELAYED_RELEASE_CAPSULE | Freq: Every day | ORAL | Status: DC

## 2016-04-02 MED ORDER — HYDRALAZINE HCL 10 MG PO TABS: 25.0000 mg | ORAL_TABLET | Freq: Once | ORAL | Status: DC

## 2016-04-02 MED ORDER — CARVEDILOL 3.125 MG PO TABS: 25.0000 mg | ORAL_TABLET | Freq: Once | ORAL | Status: DC

## 2016-04-02 NOTE — Telephone Encounter (Signed)
WALK IN Pt walks in and states he constantly has N&V when he takes his nightly meds, mornings he has no problem but every night he starts throwing up, cant sleep either, the N&V gets him "all stirred up" and he cant breathe and then he is so anxious he cant sleep No appetite Also needs to go upstairs to get his 02 tank checked PLAN: If someone does not come to appt this am, put him in their place if not next open appt 1545 today

## 2016-04-02 NOTE — Progress Notes (Signed)
Medications given in clinic (1 dose each) Medication Samples have been provided to the patient.  Drug: amlodipine Strength: 10 mg Qty: 1 [id number] Exp.Date: 11/2016 Drug: carvedilol Strength: 25 mg Qty: 1 LOT: [id number] Exp.Date: 11/2016 Drug: hydralazine Strength: 25 mg Qty: 1 LOT: [id number] Exp.Date: 05/29/16 Drug: esomeprazole Strength: 20 mg Qty: 20 LOT: [id number] Exp. Date: 11/2017  The patient has been instructed regarding the correct time, dose, and frequency of taking this medication, including desired effects and most common side effects.   According to pharmacy records, patient has not filled prescriptions since March 2017, appears that patient would be all out of medications by now; no record of filling carvedilol or hydralazine in June 2017.  Hubbard Seldon J 11:04 AM 04/02/2016

## 2016-04-02 NOTE — Progress Notes (Signed)
Physician pharmacist co-visit

## 2016-04-02 NOTE — Patient Instructions (Signed)
[name] [name],  I am going to start you back on Nexium. It lowers the acid in your stomach that may be causing your symptoms. I also want to set you up for a study to see how well your stomach is emptying food. Diabetes can cause problems with this and can cause nausea and vomiting from it.  Please try and take your blood pressure medications every day.   I would like to see you back in 2 weeks for a recheck.

## 2016-04-02 NOTE — Telephone Encounter (Signed)
Pt seen

## 2016-04-02 NOTE — Progress Notes (Signed)
Patient ID: [name] [name], male   DOB: 1955-11-14, 60 y.o.   MRN: [id number]   Subjective:   Patient ID: [name] [name] male   DOB: [birth year] 60 y.o.   MRN: [id number]  HPI: [name] is a 60 y.o. male with a past medical history listed below here today for an acute visit with complaints of nausea and vomiting for the past 4-5 months.   He reports to me that he has had ongoing nausea and vomiting only in the afternoons. States that in the mornings he is able to eat and drink without difficulty and notes good appetite at this time as well. Denies early satiety.  As the day progresses his appetite decreases and he is unable to eat more than a few bites. Reports eat or drinking anything in the afternoon/evening results in vomiting. Denies any correlation with dialysis with symptoms not improved on dialysis days and no worse when he misses dialysis. Denies any hemoptysis. No pain or difficulty swallowing. Denies any reflux symptoms. Does note that if he tries to lay down flat he coughs until he vomits. Nothing has improved his symptoms. Of note, patient has had 5 admissions since 5/14 with several being secondary to dialysis non-compliance.   He also reports chronically elevated BP. Today his BP is 195/107. He reports headache for the past 2 days with occasional blurry vision. Notes headache is diffuse and aching. Has not taken anything for the headache. Has been unable to take his afternoon BP medications due to his N/V noted above. He reports being on 3 different blood pressure medications (does not have his medications with him today) which is inconsistent with his medication list. He has amlodipine 10 mg daily, carevdilol 25 mg bid, hydralazine 25 mg tid (started most recent hospitalization), and losartan 25 mg daily noted on his med list. Denies taking the losartan. Does have a history of hyperkalemia secondary to non-compliance with dialysis. Denies taking his BP meds this morning. Does not check his  BP at home.   Past Medical History  Diagnosis Date  . Hypertension   . Hematochezia     a. 2014: colonscopy, which showed moderately-sized internal hemorrhoids, two 59mm polyps in transverse colon and ascending colon that were resected, five 2-73mm polyps in sigmoid colon, descending colon, transverse colon, and ascending colon that were resected. An upper endoscopy was performed and showed normal esophagus, stomach, and duodenum.  . Hematuria     a. H/o hematuria 2014 with cystoscopy that was unrevealing for his source of hematuria. He underwent a kidney ultrasound on 10/14 that showed mildly echogenic and scarred kidneys compatible with medical renal disease, without hydronephrosis or renal calculi.  [name] Anemia   . CAD (coronary artery disease)     a. per [facility] s/p 3.65mm x 59mm Vision BMS to mid LAD 12/2009 and Xience DES to mid LAD 10/2010.  . Colon polyps   . Chronic diastolic CHF (congestive heart failure) ([place])   . Hyperlipidemia   . Anginal pain ([place])   . Heart murmur   . 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 Type II diabetes mellitus ([place])   . History of blood transfusion     "had colonoscopy done; they had to give me some blood"  . Daily headache   . ESRD on dialysis [facility]) since ~ 2008    "[place]; TTS" (07/21/2015)  . On home oxygen therapy     "2L prn" (07/21/2015)  .  Renal insufficiency    Current Outpatient Prescriptions  Medication Sig Dispense Refill  . acetaminophen (TYLENOL) 325 MG tablet Take 2 tablets (650 mg total) by mouth every 6 (six) hours as needed for mild pain (or Fever >/= 101).    [name] albuterol (PROVENTIL HFA;VENTOLIN HFA) 108 (90 Base) MCG/ACT inhaler Inhale 1-2 puffs into the lungs every 6 (six) hours as needed for wheezing or shortness of breath. 1 Inhaler 0  . amitriptyline (ELAVIL) 100 MG tablet Take 1 tablet (100 mg total) by mouth at bedtime. 30 tablet 2  . amLODipine (NORVASC) 10 MG tablet Take 1 tablet (10 mg  total) by mouth daily. 30 tablet 3  . aspirin 81 MG chewable tablet Chew 1 tablet (81 mg total) by mouth daily.    [name] atorvastatin (LIPITOR) 20 MG tablet Take 20 mg by mouth at bedtime. Reported on 03/14/2016    . carvedilol (COREG) 25 MG tablet Take 1 tablet (25 mg total) by mouth 2 (two) times daily with a meal. 60 tablet 0  . cinacalcet (SENSIPAR) 90 MG tablet Take 90 mg by mouth at bedtime.     . hydrALAZINE (APRESOLINE) 25 MG tablet Take 1 tablet (25 mg total) by mouth 3 (three) times daily. 90 tablet 1  . multivitamin (RENA-VIT) TABS tablet Take 1 tablet by mouth at bedtime. 30 tablet 0  . pregabalin (LYRICA) 50 MG capsule Take 1 capsule (50 mg total) by mouth daily. 30 capsule 0  . sevelamer carbonate (RENVELA) 800 MG tablet Take 4 tablets (3,200 mg total) by mouth 3 (three) times daily with meals. 320 tablet 0  . venlafaxine XR (EFFEXOR XR) 75 MG 24 hr capsule Take 1 capsule (75 mg total) by mouth daily with breakfast. 30 capsule 2   No current facility-administered medications for this visit.   Family History  Problem Relation Age of Onset  . Hypertension    . Bone cancer Mother   . Anuerysm Father   . Diabetes type II Daughter    Social History   Social History  . Marital Status: Divorced    Spouse Name: N/A  . Number of Children: N/A  . Years of Education: N/A   Social History Main Topics  . Smoking status: Former Smoker -- 0.50 packs/day for 8 years    Types: Cigarettes    Quit date: 12/06/2010  . Smokeless tobacco: Never Used  . Alcohol Use: No  . Drug Use: No  . Sexual Activity: Not Currently   Other Topics Concern  . None   Social History Narrative   Moved from [place], [place] to [place] in 11/2014.   Review of Systems: Review of Systems  Constitutional: Negative for fever, chills and diaphoresis.  HENT: Negative for congestion, hearing loss and tinnitus.   Eyes: Positive for blurred vision. Negative for double vision and pain.  Respiratory: Negative  for cough and hemoptysis.   Cardiovascular: Positive for chest pain and orthopnea. Negative for palpitations, claudication, leg swelling and PND.  Gastrointestinal: Positive for nausea and vomiting. Negative for heartburn, abdominal pain, diarrhea, constipation and blood in stool.  Neurological: Positive for dizziness and headaches.   Objective:  Physical Exam: Filed Vitals:   04/02/16 1006  BP: 195/107  Pulse: 106  Temp: 97.7 F (36.5 C)  TempSrc: Oral  Weight: 207 lb 6.4 oz (94.076 kg)  SpO2: 95%   General: alert, well-developed, and cooperative to examination.  Head: normocephalic and atraumatic.  Eyes: vision grossly intact, pupils equal, pupils round, pupils reactive to  light, no injection and anicteric.  Lungs: normal respiratory effort, no accessory muscle use, normal breath sounds, no crackles, and no wheezes. Heart: tachycardic, regular rhythm, no murmur, no gallop, and no rub.  Abdomen: soft, non-tender, normal bowel sounds, no distention Extremities: No cyanosis, clubbing, edema Neurologic: alert & oriented X3, cranial nerves II-XII intact, no focal deficits  Assessment & Plan:   Case discussed with Dr. Lynnae January. Please refer to problem based charting for further details of today's visit.

## 2016-04-03 DIAGNOSIS — N2581 Secondary hyperparathyroidism of renal origin: Secondary | ICD-10-CM | POA: Diagnosis not present

## 2016-04-03 DIAGNOSIS — E119 Type 2 diabetes mellitus without complications: Secondary | ICD-10-CM | POA: Diagnosis not present

## 2016-04-03 DIAGNOSIS — N186 End stage renal disease: Secondary | ICD-10-CM | POA: Diagnosis not present

## 2016-04-03 DIAGNOSIS — D631 Anemia in chronic kidney disease: Secondary | ICD-10-CM | POA: Diagnosis not present

## 2016-04-03 NOTE — Assessment & Plan Note (Addendum)
Lab Results  Component Value Date   HGBA1C 5.4 03/24/2016   HGBA1C 5.6 03/14/2016   HGBA1C 6.1* 10/29/2015     Assessment: Patient with diet controlled DM2. Last A1c on 6/17 was 5.4 Reports that he has been hypoglycemic to 40-60 (has not checked his sugars recently but has felt this way in the past at these numbers) approximately 4 times in the past month. Reports drinking a soft drink (coke) when this occurs.   Plan: Not currently on any medications. Advised to not skip meals and get sugar tablets to have with his at all times.

## 2016-04-03 NOTE — Assessment & Plan Note (Addendum)
BP Readings from Last 3 Encounters:  04/02/16 182/104  03/27/16 131/82  03/18/16 176/100    Lab Results  Component Value Date   NA 137 04/02/2016   K 4.0 04/02/2016   CREATININE 9.99* 04/02/2016   Assessment: He also reports chronically elevated BP. Today his BP is 195/107. He reports headache for the past 2 days with occasional blurry vision. Notes headache is diffuse and aching. Has not taken anything for the headache. Has been unable to take his afternoon BP medications due to his N/V (see emesis problem). He reports being on 3 different blood pressure medications (does not have his medications with him today) which is inconsistent with his medication list. He has amlodipine 10 mg daily, carevdilol 25 mg bid, hydralazine 25 mg tid (started most recent hospitalization), and losartan 25 mg daily noted on his med list. Denies taking the losartan. Does have a history of hyperkalemia secondary to non-compliance with dialysis. Denies taking his BP meds this morning. Does not check his BP at home.   Plan: Gave patient his home dose medications of amlodipine, carvedilol and hydralazine. Spoke with Dr. Maudie Mercury who contacted his pharmacy and he has not had any refills on his medications since March 2017. Likely not been taking any of his medications other than during his hospitalizations.   BP improved slightly with administration of his antihypertensives. 182/104. Headache resolved. No further blurry vision. Denies any chest pain, shortness of breath, lightheadedness/dizziness. Checked stat BMP which is unremarkable (ESRD with last dialysis 2 days ago).   With symptomatic improvement and some BP response will not admit patient today. Instructed patient on the importance of taking his BP as prescribed. Advised patient to get a blood pressure cuff and check his bp daily and bring his log to his next visit.  Will resume Coreg 25 mg bid, Amlodipine 10 mg daily and Hydralazine 25 mg tid. Will have patient  return in two weeks for BP re-check.  Hydralazine is not ideal given his history of non-compliance and being a tid medication. Previously re-started on losartan but never filled Rx. Given his history of non-compliance with dialysis and hyperkalemia issues would avoid losartan.

## 2016-04-04 NOTE — Progress Notes (Signed)
Internal Medicine Clinic Attending  Case discussed with Dr. Boswell at the time of the visit.  We reviewed the resident's history and exam and pertinent patient test results.  I agree with the assessment, diagnosis, and plan of care documented in the resident's note.  

## 2016-04-05 DIAGNOSIS — N2581 Secondary hyperparathyroidism of renal origin: Secondary | ICD-10-CM | POA: Diagnosis not present

## 2016-04-05 DIAGNOSIS — D631 Anemia in chronic kidney disease: Secondary | ICD-10-CM | POA: Diagnosis not present

## 2016-04-05 DIAGNOSIS — N186 End stage renal disease: Secondary | ICD-10-CM | POA: Diagnosis not present

## 2016-04-05 DIAGNOSIS — E119 Type 2 diabetes mellitus without complications: Secondary | ICD-10-CM | POA: Diagnosis not present

## 2016-04-06 DIAGNOSIS — E1122 Type 2 diabetes mellitus with diabetic chronic kidney disease: Secondary | ICD-10-CM | POA: Diagnosis not present

## 2016-04-06 DIAGNOSIS — N186 End stage renal disease: Secondary | ICD-10-CM | POA: Diagnosis not present

## 2016-04-06 DIAGNOSIS — Z992 Dependence on renal dialysis: Secondary | ICD-10-CM | POA: Diagnosis not present

## 2016-04-07 DIAGNOSIS — D631 Anemia in chronic kidney disease: Secondary | ICD-10-CM | POA: Diagnosis not present

## 2016-04-07 DIAGNOSIS — D509 Iron deficiency anemia, unspecified: Secondary | ICD-10-CM | POA: Diagnosis not present

## 2016-04-07 DIAGNOSIS — N2581 Secondary hyperparathyroidism of renal origin: Secondary | ICD-10-CM | POA: Diagnosis not present

## 2016-04-07 DIAGNOSIS — N186 End stage renal disease: Secondary | ICD-10-CM | POA: Diagnosis not present

## 2016-04-07 DIAGNOSIS — E119 Type 2 diabetes mellitus without complications: Secondary | ICD-10-CM | POA: Diagnosis not present

## 2016-04-07 DIAGNOSIS — Z23 Encounter for immunization: Secondary | ICD-10-CM | POA: Diagnosis not present

## 2016-04-12 DIAGNOSIS — D509 Iron deficiency anemia, unspecified: Secondary | ICD-10-CM | POA: Diagnosis not present

## 2016-04-12 DIAGNOSIS — D631 Anemia in chronic kidney disease: Secondary | ICD-10-CM | POA: Diagnosis not present

## 2016-04-12 DIAGNOSIS — Z23 Encounter for immunization: Secondary | ICD-10-CM | POA: Diagnosis not present

## 2016-04-12 DIAGNOSIS — E119 Type 2 diabetes mellitus without complications: Secondary | ICD-10-CM | POA: Diagnosis not present

## 2016-04-12 DIAGNOSIS — N186 End stage renal disease: Secondary | ICD-10-CM | POA: Diagnosis not present

## 2016-04-12 DIAGNOSIS — N2581 Secondary hyperparathyroidism of renal origin: Secondary | ICD-10-CM | POA: Diagnosis not present

## 2016-04-13 ENCOUNTER — Encounter (HOSPITAL_COMMUNITY): Payer: Self-pay

## 2016-04-13 ENCOUNTER — Ambulatory Visit (HOSPITAL_COMMUNITY): Payer: Medicare Other | Attending: Internal Medicine

## 2016-04-13 ENCOUNTER — Non-Acute Institutional Stay (HOSPITAL_COMMUNITY)
Admission: EM | Admit: 2016-04-13 | Discharge: 2016-04-13 | Disposition: A | Discharge status: Home / Self Care | Payer: Medicare Other | Attending: Emergency Medicine | Admitting: Emergency Medicine

## 2016-04-13 ENCOUNTER — Emergency Department (HOSPITAL_COMMUNITY): Payer: Medicare Other

## 2016-04-13 DIAGNOSIS — Z9981 Dependence on supplemental oxygen: Secondary | ICD-10-CM | POA: Insufficient documentation

## 2016-04-13 DIAGNOSIS — I132 Hypertensive heart and chronic kidney disease with heart failure and with stage 5 chronic kidney disease, or end stage renal disease: Secondary | ICD-10-CM | POA: Insufficient documentation

## 2016-04-13 DIAGNOSIS — R079 Chest pain, unspecified: Secondary | ICD-10-CM | POA: Diagnosis not present

## 2016-04-13 DIAGNOSIS — Z87891 Personal history of nicotine dependence: Secondary | ICD-10-CM | POA: Insufficient documentation

## 2016-04-13 DIAGNOSIS — R609 Edema, unspecified: Secondary | ICD-10-CM

## 2016-04-13 DIAGNOSIS — I9589 Other hypotension: Secondary | ICD-10-CM | POA: Diagnosis not present

## 2016-04-13 DIAGNOSIS — E1122 Type 2 diabetes mellitus with diabetic chronic kidney disease: Secondary | ICD-10-CM | POA: Insufficient documentation

## 2016-04-13 DIAGNOSIS — Z992 Dependence on renal dialysis: Secondary | ICD-10-CM | POA: Insufficient documentation

## 2016-04-13 DIAGNOSIS — Z7982 Long term (current) use of aspirin: Secondary | ICD-10-CM | POA: Diagnosis not present

## 2016-04-13 DIAGNOSIS — I2510 Atherosclerotic heart disease of native coronary artery without angina pectoris: Secondary | ICD-10-CM | POA: Insufficient documentation

## 2016-04-13 DIAGNOSIS — R600 Localized edema: Secondary | ICD-10-CM | POA: Diagnosis not present

## 2016-04-13 DIAGNOSIS — I5032 Chronic diastolic (congestive) heart failure: Secondary | ICD-10-CM | POA: Insufficient documentation

## 2016-04-13 DIAGNOSIS — E785 Hyperlipidemia, unspecified: Secondary | ICD-10-CM | POA: Diagnosis not present

## 2016-04-13 DIAGNOSIS — Z86010 Personal history of colonic polyps: Secondary | ICD-10-CM | POA: Diagnosis not present

## 2016-04-13 DIAGNOSIS — I159 Secondary hypertension, unspecified: Secondary | ICD-10-CM

## 2016-04-13 DIAGNOSIS — Z955 Presence of coronary angioplasty implant and graft: Secondary | ICD-10-CM | POA: Insufficient documentation

## 2016-04-13 DIAGNOSIS — J811 Chronic pulmonary edema: Secondary | ICD-10-CM | POA: Diagnosis present

## 2016-04-13 DIAGNOSIS — Z79899 Other long term (current) drug therapy: Secondary | ICD-10-CM | POA: Diagnosis not present

## 2016-04-13 DIAGNOSIS — R0602 Shortness of breath: Secondary | ICD-10-CM | POA: Diagnosis not present

## 2016-04-13 DIAGNOSIS — N186 End stage renal disease: Secondary | ICD-10-CM | POA: Insufficient documentation

## 2016-04-13 LAB — CBC WITH DIFFERENTIAL/PLATELET
BASOS ABS: 0 10*3/uL (ref 0.0–0.1)
BASOS PCT: 0 %
EOS ABS: 0.2 10*3/uL (ref 0.0–0.7)
Eosinophils Relative: 3 %
HCT: 34.6 % — ABNORMAL LOW (ref 39.0–52.0)
HEMOGLOBIN: 10.5 g/dL — AB (ref 13.0–17.0)
Lymphocytes Relative: 15 %
Lymphs Abs: 1 10*3/uL (ref 0.7–4.0)
MCH: 24.2 pg — ABNORMAL LOW (ref 26.0–34.0)
MCHC: 30.3 g/dL (ref 30.0–36.0)
MCV: 79.9 fL (ref 78.0–100.0)
Monocytes Absolute: 0.4 10*3/uL (ref 0.1–1.0)
Monocytes Relative: 6 %
NEUTROS PCT: 76 %
Neutro Abs: 5.3 10*3/uL (ref 1.7–7.7)
Platelets: 118 10*3/uL — ABNORMAL LOW (ref 150–400)
RBC: 4.33 MIL/uL (ref 4.22–5.81)
RDW: 19.1 % — ABNORMAL HIGH (ref 11.5–15.5)
WBC: 7 10*3/uL (ref 4.0–10.5)

## 2016-04-13 LAB — COMPREHENSIVE METABOLIC PANEL
ALBUMIN: 3.4 g/dL — AB (ref 3.5–5.0)
ALK PHOS: 93 U/L (ref 38–126)
ALT: 10 U/L — ABNORMAL LOW (ref 17–63)
ANION GAP: 12 (ref 5–15)
AST: 12 U/L — ABNORMAL LOW (ref 15–41)
BUN: 51 mg/dL — ABNORMAL HIGH (ref 6–20)
CALCIUM: 9 mg/dL (ref 8.9–10.3)
CO2: 26 mmol/L (ref 22–32)
Chloride: 101 mmol/L (ref 101–111)
Creatinine, Ser: 9.47 mg/dL — ABNORMAL HIGH (ref 0.61–1.24)
GFR calc Af Amer: 6 mL/min — ABNORMAL LOW (ref >60)
GFR calc non Af Amer: 5 mL/min — ABNORMAL LOW (ref >60)
GLUCOSE: 101 mg/dL — AB (ref 65–99)
Potassium: 4.1 mmol/L (ref 3.5–5.1)
SODIUM: 139 mmol/L (ref 135–145)
Total Bilirubin: 1 mg/dL (ref 0.3–1.2)
Total Protein: 7.2 g/dL (ref 6.5–8.1)

## 2016-04-13 LAB — I-STAT TROPONIN, ED: Troponin i, poc: 0.02 ng/mL (ref 0.00–0.08)

## 2016-04-13 NOTE — Procedures (Signed)
Asked to see for dialysis.  Pt here w SOB, mild-mod pulm edema on CXR, not in distress and K ok.  For HD then dc home afterwards if remains stable.  Should go to usual HD tomorrow on Sat as outpatieint.     [facility] TTS 4h 500/800 2/2 bath P2 Hep 8000 88.5kg (from mid-June these orders are)  I was present at this dialysis session, have reviewed the session itself and made  appropriate changes Kelly Splinter MD [place] pager [phone]    cell [phone] 04/13/2016, 4:16 PM

## 2016-04-13 NOTE — ED Notes (Signed)
Pt presents with onset of mid-sternal chest pain that began early this morning at 0100.  Pt is on HD, reports no treatment on Tuesday but received full treatment yesterday.  +shortness of breath; EMS gave 324mg  ASA PTA.

## 2016-04-13 NOTE — ED Provider Notes (Signed)
CSN: [id number]     Arrival date & time 04/13/16  1343 History   First MD Initiated Contact with Patient 04/13/16 1345     Chief Complaint  Patient presents with  . Chest Pain     (Consider location/radiation/quality/duration/timing/severity/associated sxs/prior Treatment) HPI [name] [name] is a 59 y.o. male with history of coronary disease, CHF, diabetes, hypertension, renal failure on dialysis, presents to emergency department complaining of chest pain or shortness of breath. Patient states that he missed dialysis on Tuesday, 3 days ago, he did receive dialysis on Thursday but believes that he did not get enough fluid off of him. He states they did have to stop dialysis approximately 10 minutes early because he was cramping. He states the shortness of breath started this morning. He states he is having trouble breathing, having chest pain, states it feels like his is having fluid on his lungs.  Past Medical History  Diagnosis Date  . Hypertension   . Hematochezia     a. 2014: colonscopy, which showed moderately-sized internal hemorrhoids, two 40mm polyps in transverse colon and ascending colon that were resected, five 2-25mm polyps in sigmoid colon, descending colon, transverse colon, and ascending colon that were resected. An upper endoscopy was performed and showed normal esophagus, stomach, and duodenum.  . Hematuria     a. H/o hematuria 2014 with cystoscopy that was unrevealing for his source of hematuria. He underwent a kidney ultrasound on 10/14 that showed mildly echogenic and scarred kidneys compatible with medical renal disease, without hydronephrosis or renal calculi.  [name] Anemia   . CAD (coronary artery disease)     a. per [facility] s/p 3.26mm x 40mm Vision BMS to mid LAD 12/2009 and Xience DES to mid LAD 10/2010.  . Colon polyps   . Chronic diastolic CHF (congestive heart failure) ([place])   . Hyperlipidemia   . Anginal pain ([place])   . Heart murmur   . Tuberculosis     "when I was  little; I caught it from my daddy"  . Type II diabetes mellitus ([place])   . History of blood transfusion     "had colonoscopy done; they had to give me some blood"  . Daily headache   . ESRD on dialysis [facility]) since ~ 2008    "[place]; TTS" (07/21/2015)  . On home oxygen therapy     "2L prn" (07/21/2015)  . Renal insufficiency    Past Surgical History  Procedure Laterality Date  . Left heart catheterization with coronary angiogram N/A 11/23/2014    Procedure: LEFT HEART CATHETERIZATION WITH CORONARY ANGIOGRAM;  Surgeon: Troy Sine, MD;  Location: [facility] C[facility] LAB;  Service: Cardiovascular;  Laterality: N/A;  . Lithotripsy  X1  . Cystoscopy w/ stone manipulation  X2?  . Cardiac catheterization  "several"  . Coronary angioplasty with stent placement  "several"  . Eye surgery Bilateral     "laser OR for hemorrhage"  . Av fistula placement Left ~ 2007    "upper arm"   Family History  Problem Relation Age of Onset  . Hypertension    . Bone cancer Mother   . Anuerysm Father   . Diabetes type II Daughter    Social History  Substance Use Topics  . Smoking status: Former Smoker -- 0.50 packs/day for 8 years    Types: Cigarettes    Quit date: 12/06/2010  . Smokeless tobacco: Never Used  . Alcohol Use: No    Review of Systems  Constitutional: Negative for fever and  chills.  Respiratory: Positive for chest tightness and shortness of breath. Negative for cough.   Cardiovascular: Positive for chest pain and leg swelling. Negative for palpitations.  Gastrointestinal: Negative for nausea, vomiting, abdominal pain, diarrhea and abdominal distention.  Musculoskeletal: Negative for myalgias, arthralgias, neck pain and neck stiffness.  Skin: Negative for rash.  Allergic/Immunologic: Negative for immunocompromised state.  Neurological: Negative for dizziness, weakness, light-headedness, numbness and headaches.  All other systems reviewed and are negative.     Allergies   Enalapril  Home Medications   Prior to Admission medications   Medication Sig Start Date End Date Taking? Authorizing Provider  acetaminophen (TYLENOL) 325 MG tablet Take 2 tablets (650 mg total) by mouth every 6 (six) hours as needed for mild pain (or Fever >/= 101). 03/27/16   Kinnie Feil, MD  albuterol (PROVENTIL HFA;VENTOLIN HFA) 108 (90 Base) MCG/ACT inhaler Inhale 1-2 puffs into the lungs every 6 (six) hours as needed for wheezing or shortness of breath. 01/19/16   Delsa Grana, PA-C  amitriptyline (ELAVIL) 100 MG tablet Take 1 tablet (100 mg total) by mouth at bedtime. 07/27/15   Shela Leff, MD  amLODipine (NORVASC) 10 MG tablet Take 1 tablet (10 mg total) by mouth daily. 10/30/15   Ripudeep Krystal Eaton, MD  aspirin 81 MG chewable tablet Chew 1 tablet (81 mg total) by mouth daily. 07/22/15   Iline Oven, MD  atorvastatin (LIPITOR) 20 MG tablet Take 20 mg by mouth at bedtime. Reported on 03/14/2016    Historical Provider, MD  carvedilol (COREG) 25 MG tablet Take 1 tablet (25 mg total) by mouth 2 (two) times daily with a meal. 03/12/16   Geradine Girt, DO  cinacalcet (SENSIPAR) 90 MG tablet Take 90 mg by mouth at bedtime.     Historical Provider, MD  esomeprazole (NEXIUM) 20 MG capsule Take 1 capsule (20 mg total) by mouth daily. 04/02/16 04/02/17  Maryellen Pile, MD  hydrALAZINE (APRESOLINE) 25 MG tablet Take 1 tablet (25 mg total) by mouth 3 (three) times daily. 03/18/16   Reyne Dumas, MD  multivitamin (RENA-VIT) TABS tablet Take 1 tablet by mouth at bedtime. 12/15/14   Geradine Girt, DO  pregabalin (LYRICA) 50 MG capsule Take 1 capsule (50 mg total) by mouth daily. 12/15/14   Geradine Girt, DO  sevelamer carbonate (RENVELA) 800 MG tablet Take 4 tablets (3,200 mg total) by mouth 3 (three) times daily with meals. 03/12/16   Geradine Girt, DO  venlafaxine XR (EFFEXOR XR) 75 MG 24 hr capsule Take 1 capsule (75 mg total) by mouth daily with breakfast. 07/27/15   Shela Leff, MD   BP  169/95 mmHg  Pulse 94  Temp(Src) 98.6 F (37 C) (Oral)  Resp 18  Ht 6' (1.829 m)  Wt 93.895 kg  BMI 28.07 kg/m2  SpO2 98% Physical Exam  Constitutional: He appears well-developed and well-nourished. No distress.  HENT:  Head: Normocephalic and atraumatic.  Eyes: Conjunctivae are normal.  Neck: Neck supple.  Cardiovascular: Normal rate, regular rhythm and normal heart sounds.   Pulmonary/Chest: Effort normal. No respiratory distress. He has no wheezes. He has rales.  Abdominal: Soft. Bowel sounds are normal. He exhibits no distension. There is no tenderness. There is no rebound.  Musculoskeletal: He exhibits edema.  1+ LE edema bilaterally  Neurological: He is alert.  Skin: Skin is warm and dry.  Nursing note and vitals reviewed.   ED Course  Procedures (including critical care time) Labs Review Labs Reviewed  CBC WITH DIFFERENTIAL/PLATELET - Abnormal; Notable for the following:    Hemoglobin 10.5 (*)    HCT 34.6 (*)    MCH 24.2 (*)    RDW 19.1 (*)    Platelets 118 (*)    All other components within normal limits  COMPREHENSIVE METABOLIC PANEL - Abnormal; Notable for the following:    Glucose, Bld 101 (*)    BUN 51 (*)    Creatinine, Ser 9.47 (*)    Albumin 3.4 (*)    AST 12 (*)    ALT 10 (*)    GFR calc non Af Amer 5 (*)    GFR calc Af Amer 6 (*)    All other components within normal limits  I-STAT TROPOININ, ED    Imaging Review Dg Chest 2 View  04/13/2016  CLINICAL DATA:  Shortness of breath and chest pain for 1 day EXAM: CHEST  2 VIEW COMPARISON:  March 24, 2016 FINDINGS: There is moderate interstitial edema. There is no airspace consolidation. There is cardiomegaly with pulmonary venous hypertension. There is atherosclerotic calcification in the aortic arch. There is no appreciable adenopathy. There are no evident bone lesions. IMPRESSION: Findings consistent with congestive heart failure. Aortic atherosclerosis. No airspace consolidation. Electronically Signed    By: Lowella Grip III M.D.   On: 04/13/2016 15:43   I have personally reviewed and evaluated these images and lab results as part of my medical decision-making.   EKG Interpretation   Date/Time:  Friday April 13 2016 13:47:08 EDT Ventricular Rate:  94 PR Interval:  138 QRS Duration: 98 QT Interval:  386 QTC Calculation: 482 R Axis:   65 Text Interpretation:  Sinus rhythm ST \\T \ T wave abnormality, consider  inferolateral ischemia , unchanged from prior ECG Prolonged QT Abnormal  ECG No significant change since last tracing Confirmed by KNAPP  MD-J, JON  [id number]) on 04/13/2016 1:59:48 PM      MDM   Final diagnoses:  Interstitial edema  Secondary hypertension, unspecified    Patient with shortness of breath, missed dialysis 2 days ago. He is hypertensive, normal oxygen saturation on room air, afebrile. Will get labs and chest x-ray. EKG.  Negative troponin. Electrolytes normal. Chest x-ray showing no moderate interstitial edema. No airspace consolidation. I spoke with Dr. Jonnie Finner, he will dialyze him today and DC home. I discussed this with patient and he agrees.  Filed Vitals:   04/13/16 1343 04/13/16 1351 04/13/16 1508  BP:  169/95   Pulse:  94 100  Temp:  98.6 F (37 C)   TempSrc:  Oral   Resp:  18   Height:  6' (1.829 m)   Weight:  93.895 kg   SpO2: 98% 98% 99%      Jeannett Senior, PA-C 04/13/16 1621  Dorie Rank, MD 04/13/16 1623

## 2016-04-13 NOTE — Progress Notes (Signed)
[name] [name] completed 3.5 hours of HD with occasional cramps but pt was able to tolerate through the treatment. Net UF: 3250 cc; and pt verbalized breathing better and feeling better. Still with high BP's; pt. Was instructed to take BP meds as soon as he gets home - pt agreed.[name] Pt. was discharged post HD A/Ox4 and ambulatory. He verbalized being capable of walking to the ED area to be picked up by a friend who will drop him off at his home. Pt appeared stable, and with good walking gait.

## 2016-04-13 NOTE — ED Notes (Signed)
Pt called out, states he doesn't feel like he can breath.  Saturations WDL on 3L.  Pt is requesting CPAP.  This RN reassured and placed pt back on monitor.

## 2016-04-13 NOTE — ED Notes (Signed)
Pt provided with [place] sandwich and ginger ale.  Made aware of POC.

## 2016-04-14 ENCOUNTER — Emergency Department (HOSPITAL_COMMUNITY)
Admission: EM | Admit: 2016-04-14 | Discharge: 2016-04-14 | Disposition: A | Discharge status: Home / Self Care | Payer: Medicare Other | Attending: Emergency Medicine | Admitting: Emergency Medicine

## 2016-04-14 ENCOUNTER — Encounter (HOSPITAL_COMMUNITY): Payer: Self-pay | Admitting: *Deleted

## 2016-04-14 DIAGNOSIS — Y939 Activity, unspecified: Secondary | ICD-10-CM | POA: Insufficient documentation

## 2016-04-14 DIAGNOSIS — Y929 Unspecified place or not applicable: Secondary | ICD-10-CM | POA: Insufficient documentation

## 2016-04-14 DIAGNOSIS — E1122 Type 2 diabetes mellitus with diabetic chronic kidney disease: Secondary | ICD-10-CM | POA: Diagnosis not present

## 2016-04-14 DIAGNOSIS — S41112A Laceration without foreign body of left upper arm, initial encounter: Secondary | ICD-10-CM | POA: Insufficient documentation

## 2016-04-14 DIAGNOSIS — E119 Type 2 diabetes mellitus without complications: Secondary | ICD-10-CM | POA: Diagnosis not present

## 2016-04-14 DIAGNOSIS — Z7901 Long term (current) use of anticoagulants: Secondary | ICD-10-CM | POA: Insufficient documentation

## 2016-04-14 DIAGNOSIS — Z23 Encounter for immunization: Secondary | ICD-10-CM | POA: Diagnosis not present

## 2016-04-14 DIAGNOSIS — N186 End stage renal disease: Secondary | ICD-10-CM | POA: Insufficient documentation

## 2016-04-14 DIAGNOSIS — Y999 Unspecified external cause status: Secondary | ICD-10-CM | POA: Diagnosis not present

## 2016-04-14 DIAGNOSIS — Z992 Dependence on renal dialysis: Secondary | ICD-10-CM | POA: Insufficient documentation

## 2016-04-14 DIAGNOSIS — I5032 Chronic diastolic (congestive) heart failure: Secondary | ICD-10-CM | POA: Diagnosis not present

## 2016-04-14 DIAGNOSIS — X58XXXA Exposure to other specified factors, initial encounter: Secondary | ICD-10-CM | POA: Insufficient documentation

## 2016-04-14 DIAGNOSIS — Z79899 Other long term (current) drug therapy: Secondary | ICD-10-CM | POA: Insufficient documentation

## 2016-04-14 DIAGNOSIS — T82838A Hemorrhage of vascular prosthetic devices, implants and grafts, initial encounter: Secondary | ICD-10-CM | POA: Diagnosis present

## 2016-04-14 DIAGNOSIS — T148XXA Other injury of unspecified body region, initial encounter: Secondary | ICD-10-CM

## 2016-04-14 DIAGNOSIS — I132 Hypertensive heart and chronic kidney disease with heart failure and with stage 5 chronic kidney disease, or end stage renal disease: Secondary | ICD-10-CM | POA: Insufficient documentation

## 2016-04-14 DIAGNOSIS — Y658 Other specified misadventures during surgical and medical care: Secondary | ICD-10-CM | POA: Insufficient documentation

## 2016-04-14 DIAGNOSIS — Z9981 Dependence on supplemental oxygen: Secondary | ICD-10-CM | POA: Diagnosis not present

## 2016-04-14 DIAGNOSIS — N2581 Secondary hyperparathyroidism of renal origin: Secondary | ICD-10-CM | POA: Diagnosis not present

## 2016-04-14 DIAGNOSIS — S41131A Puncture wound without foreign body of right upper arm, initial encounter: Secondary | ICD-10-CM | POA: Diagnosis not present

## 2016-04-14 DIAGNOSIS — I2510 Atherosclerotic heart disease of native coronary artery without angina pectoris: Secondary | ICD-10-CM | POA: Diagnosis not present

## 2016-04-14 DIAGNOSIS — D509 Iron deficiency anemia, unspecified: Secondary | ICD-10-CM | POA: Diagnosis not present

## 2016-04-14 DIAGNOSIS — Z87891 Personal history of nicotine dependence: Secondary | ICD-10-CM | POA: Insufficient documentation

## 2016-04-14 DIAGNOSIS — D631 Anemia in chronic kidney disease: Secondary | ICD-10-CM | POA: Diagnosis not present

## 2016-04-14 MED ORDER — LOSARTAN POTASSIUM 50 MG PO TABS
100.0000 mg | ORAL_TABLET | Freq: Once | ORAL | Status: AC
  Administered 2016-04-14: 100 mg via ORAL
  Filled 2016-04-14: qty 2

## 2016-04-14 MED ORDER — PREGABALIN 50 MG PO CAPS
50.0000 mg | ORAL_CAPSULE | Freq: Once | ORAL | Status: AC
  Administered 2016-04-14: 50 mg via ORAL
  Filled 2016-04-14: qty 1

## 2016-04-14 MED ORDER — AMLODIPINE BESYLATE 5 MG PO TABS
10.0000 mg | ORAL_TABLET | Freq: Once | ORAL | Status: AC
  Administered 2016-04-14: 10 mg via ORAL
  Filled 2016-04-14: qty 2

## 2016-04-14 MED ORDER — CARVEDILOL 12.5 MG PO TABS
25.0000 mg | ORAL_TABLET | Freq: Two times a day (BID) | ORAL | Status: DC
  Administered 2016-04-14: 25 mg via ORAL
  Filled 2016-04-14: qty 2

## 2016-04-14 MED ORDER — HYDRALAZINE HCL 25 MG PO TABS
25.0000 mg | ORAL_TABLET | Freq: Once | ORAL | Status: AC
  Administered 2016-04-14: 25 mg via ORAL
  Filled 2016-04-14: qty 1

## 2016-04-14 NOTE — ED Notes (Signed)
Abd pad placed over 4x4 already in place with coban to secure.

## 2016-04-14 NOTE — Discharge Instructions (Signed)
You were evaluated today for bleeding after dialysis. We placed a suture at the bleeding site to stop the bleeding. Leave the pressure dressing on for 12-24 hours, remove if you have any numbness or coldness of your hand. Follow-up with here dialysis clinic in approximately 3 days for reevaluation and suture removal. Seek immediate medical attention if you have any redness, drainage, increased pain, or repeat bleeding at the fistula site.

## 2016-04-14 NOTE — ED Notes (Signed)
Pt states he has had bleeding from his left upper arm graft since 11 am, worse at times than others. Pressure dressing applied in triage.

## 2016-04-14 NOTE — ED Provider Notes (Signed)
CSN: [id number]     Arrival date & time 04/14/16  1633 History   First MD Initiated Contact with Patient 04/14/16 1648     Chief Complaint  Patient presents with  . Vascular Access Problem     (Consider location/radiation/quality/duration/timing/severity/associated sxs/prior Treatment) HPI Comments: 60yo M w/ extensive PMH including ESRD on HD who p/w bleeding from fistula site. The patient had dialysis earlier today and around 11 AM after dialysis was complete, he began having bleeding from his dialysis site on his left upper arm fistula. The bleeding has been intermittent since then but has not fully stopped. He endorses pain around his fistula site since the bleeding started but denies any pain prior to dialysis.  The history is provided by the patient.    Past Medical History  Diagnosis Date  . Hypertension   . Hematochezia     a. 2014: colonscopy, which showed moderately-sized internal hemorrhoids, two 52mm polyps in transverse colon and ascending colon that were resected, five 2-68mm polyps in sigmoid colon, descending colon, transverse colon, and ascending colon that were resected. An upper endoscopy was performed and showed normal esophagus, stomach, and duodenum.  . Hematuria     a. H/o hematuria 2014 with cystoscopy that was unrevealing for his source of hematuria. He underwent a kidney ultrasound on 10/14 that showed mildly echogenic and scarred kidneys compatible with medical renal disease, without hydronephrosis or renal calculi.  [name] Anemia   . CAD (coronary artery disease)     a. per [facility] s/p 3.22mm x 107mm Vision BMS to mid LAD 12/2009 and Xience DES to mid LAD 10/2010.  . Colon polyps   . Chronic diastolic CHF (congestive heart failure) ([place])   . Hyperlipidemia   . Anginal pain ([place])   . Heart murmur   . Tuberculosis     "when I was [place]; I caught it from my daddy"  . Type II diabetes mellitus ([place])   . History of blood transfusion     "had colonoscopy done; they  had to give me some blood"  . Daily headache   . ESRD on dialysis [facility]) since ~ 2008    "[place]; TTS" (07/21/2015)  . On home oxygen therapy     "2L prn" (07/21/2015)  . Renal insufficiency    Past Surgical History  Procedure Laterality Date  . Left heart catheterization with coronary angiogram N/A 11/23/2014    Procedure: LEFT HEART CATHETERIZATION WITH CORONARY ANGIOGRAM;  Surgeon: Troy Sine, MD;  Location: [facility] CATH LAB;  Service: Cardiovascular;  Laterality: N/A;  . Lithotripsy  X1  . Cystoscopy w/ stone manipulation  X2?  . Cardiac catheterization  "several"  . Coronary angioplasty with stent placement  "several"  . Eye surgery Bilateral     "laser OR for hemorrhage"  . Av fistula placement Left ~ 2007    "upper arm"   Family History  Problem Relation Age of Onset  . Hypertension    . Bone cancer Mother   . Anuerysm Father   . Diabetes type II Daughter    Social History  Substance Use Topics  . Smoking status: Former Smoker -- 0.50 packs/day for 8 years    Types: Cigarettes    Quit date: 12/06/2010  . Smokeless tobacco: Never Used  . Alcohol Use: No    Review of Systems 10 Systems reviewed and are negative for acute change except as noted in the HPI.   Allergies  Enalapril  Home Medications   Prior to  Admission medications   Medication Sig Start Date End Date Taking? Authorizing Provider  acetaminophen (TYLENOL) 325 MG tablet Take 2 tablets (650 mg total) by mouth every 6 (six) hours as needed for mild pain (or Fever >/= 101). 03/27/16   Kinnie Feil, MD  albuterol (PROVENTIL HFA;VENTOLIN HFA) 108 (90 Base) MCG/ACT inhaler Inhale 1-2 puffs into the lungs every 6 (six) hours as needed for wheezing or shortness of breath. 01/19/16   Delsa Grana, PA-C  amitriptyline (ELAVIL) 100 MG tablet Take 1 tablet (100 mg total) by mouth at bedtime. 07/27/15   Shela Leff, MD  amLODipine (NORVASC) 10 MG tablet Take 1 tablet (10 mg total) by mouth daily. 10/30/15    Ripudeep Krystal Eaton, MD  aspirin 81 MG chewable tablet Chew 1 tablet (81 mg total) by mouth daily. 07/22/15   Iline Oven, MD  atorvastatin (LIPITOR) 20 MG tablet Take 20 mg by mouth at bedtime. Reported on 03/14/2016    Historical Provider, MD  carvedilol (COREG) 25 MG tablet Take 1 tablet (25 mg total) by mouth 2 (two) times daily with a meal. 03/12/16   Geradine Girt, DO  cinacalcet (SENSIPAR) 90 MG tablet Take 90 mg by mouth at bedtime.     Historical Provider, MD  esomeprazole (NEXIUM) 20 MG capsule Take 1 capsule (20 mg total) by mouth daily. Patient taking differently: Take 20 mg by mouth daily with supper.  04/02/16 04/02/17  Maryellen Pile, MD  hydrALAZINE (APRESOLINE) 25 MG tablet Take 1 tablet (25 mg total) by mouth 3 (three) times daily. 03/18/16   Reyne Dumas, MD  losartan (COZAAR) 100 MG tablet Take 100 mg by mouth at bedtime. 04/11/16   Historical Provider, MD  multivitamin (RENA-VIT) TABS tablet Take 1 tablet by mouth at bedtime. 12/15/14   Geradine Girt, DO  OXYGEN Inhale 2 L into the lungs continuous.    Historical Provider, MD  pregabalin (LYRICA) 50 MG capsule Take 1 capsule (50 mg total) by mouth daily. 12/15/14   Geradine Girt, DO  sevelamer carbonate (RENVELA) 800 MG tablet Take 4 tablets (3,200 mg total) by mouth 3 (three) times daily with meals. Patient taking differently: Take 1,600-3,200 mg by mouth See admin instructions. Take 4 tablets (3200 mg) by mouth with meals and 2 tablets (1600 mg) with snacks 03/12/16   Geradine Girt, DO  venlafaxine XR (EFFEXOR XR) 75 MG 24 hr capsule Take 1 capsule (75 mg total) by mouth daily with breakfast. 07/27/15   Shela Leff, MD   BP 182/98 mmHg  Pulse 107  Temp(Src) 97.9 F (36.6 C) (Oral)  Resp 16  Ht 6' (1.829 m)  Wt 207 lb (93.895 kg)  BMI 28.07 kg/m2  SpO2 100% Physical Exam  Constitutional: He is oriented to person, place, and time. He appears well-developed and well-nourished. No distress.  HENT:  Head: Normocephalic and  atraumatic.  Eyes: Conjunctivae are normal.  Neck: Neck supple.  Musculoskeletal: He exhibits no edema.  L upper arm fistula w/ punctate area of venous oozing, no pulsatile bleeding  Neurological: He is alert and oriented to person, place, and time.  Skin: Skin is warm and dry.  Psychiatric: He has a normal mood and affect. Judgment normal.  Nursing note and vitals reviewed.   ED Course  .Marland KitchenLaceration Repair Date/Time: 04/14/2016 6:47 PM Performed by: Sharlett Iles Authorized by: Sharlett Iles Consent: Verbal consent obtained. Body area: upper extremity Location details: left upper arm Laceration length: 0.1 cm Vascular damage:  no Skin closure: 4-0 nylon Number of sutures: 1 Suture technique: figure of 8. Approximation: close Approximation difficulty: simple Dressing: pressure dressing Patient tolerance: Patient tolerated the procedure well with no immediate complications   (including critical care time) Labs Review Labs Reviewed - No data to display  Imaging Review Dg Chest 2 View  04/13/2016  CLINICAL DATA:  Shortness of breath and chest pain for 1 day EXAM: CHEST  2 VIEW COMPARISON:  March 24, 2016 FINDINGS: There is moderate interstitial edema. There is no airspace consolidation. There is cardiomegaly with pulmonary venous hypertension. There is atherosclerotic calcification in the aortic arch. There is no appreciable adenopathy. There are no evident bone lesions. IMPRESSION: Findings consistent with congestive heart failure. Aortic atherosclerosis. No airspace consolidation. Electronically Signed   By: Lowella Grip III M.D.   On: 04/13/2016 15:43    MDM   Final diagnoses:  Bleeding from wound ([place])    Pt w/ venous bleeding from puncture site on fistula after dialysis today. Placed hemostatic dressing on wound with compression. On repeat examinations, the patient continued to have very slow venous oozing. Applied topical thrombotic dressing and repeat  compression without success. Applied silver nitrate the patient continued to have small ooze. Placed figure of 8 stitch which was successful. Applied light pressure dressing and instructed to keep dressing on for 12-24 hours. Instructed to follow-up at routine dialysis appointment for removal of suture. Return precautions reviewed and patient discharged in satisfactory condition.    Sharlett Iles, MD 04/14/16 [phone]

## 2016-04-17 ENCOUNTER — Encounter: Payer: Medicare Other | Admitting: Internal Medicine

## 2016-04-17 ENCOUNTER — Encounter (HOSPITAL_COMMUNITY): Payer: Self-pay | Admitting: Emergency Medicine

## 2016-04-17 ENCOUNTER — Emergency Department (HOSPITAL_COMMUNITY)
Admission: EM | Admit: 2016-04-17 | Discharge: 2016-04-17 | Disposition: A | Discharge status: Home / Self Care | Payer: Medicare Other | Attending: Emergency Medicine | Admitting: Emergency Medicine

## 2016-04-17 ENCOUNTER — Emergency Department (HOSPITAL_COMMUNITY): Payer: Medicare Other

## 2016-04-17 ENCOUNTER — Encounter: Payer: Self-pay | Admitting: Internal Medicine

## 2016-04-17 DIAGNOSIS — D509 Iron deficiency anemia, unspecified: Secondary | ICD-10-CM | POA: Diagnosis not present

## 2016-04-17 DIAGNOSIS — E119 Type 2 diabetes mellitus without complications: Secondary | ICD-10-CM | POA: Insufficient documentation

## 2016-04-17 DIAGNOSIS — Z992 Dependence on renal dialysis: Secondary | ICD-10-CM | POA: Diagnosis not present

## 2016-04-17 DIAGNOSIS — Z87891 Personal history of nicotine dependence: Secondary | ICD-10-CM | POA: Insufficient documentation

## 2016-04-17 DIAGNOSIS — R0602 Shortness of breath: Secondary | ICD-10-CM | POA: Diagnosis not present

## 2016-04-17 DIAGNOSIS — Z7982 Long term (current) use of aspirin: Secondary | ICD-10-CM | POA: Insufficient documentation

## 2016-04-17 DIAGNOSIS — I5032 Chronic diastolic (congestive) heart failure: Secondary | ICD-10-CM | POA: Diagnosis not present

## 2016-04-17 DIAGNOSIS — Z955 Presence of coronary angioplasty implant and graft: Secondary | ICD-10-CM | POA: Diagnosis not present

## 2016-04-17 DIAGNOSIS — I132 Hypertensive heart and chronic kidney disease with heart failure and with stage 5 chronic kidney disease, or end stage renal disease: Secondary | ICD-10-CM | POA: Diagnosis not present

## 2016-04-17 DIAGNOSIS — D631 Anemia in chronic kidney disease: Secondary | ICD-10-CM | POA: Diagnosis not present

## 2016-04-17 DIAGNOSIS — I2510 Atherosclerotic heart disease of native coronary artery without angina pectoris: Secondary | ICD-10-CM | POA: Diagnosis not present

## 2016-04-17 DIAGNOSIS — Z79899 Other long term (current) drug therapy: Secondary | ICD-10-CM | POA: Insufficient documentation

## 2016-04-17 DIAGNOSIS — E8770 Fluid overload, unspecified: Secondary | ICD-10-CM

## 2016-04-17 DIAGNOSIS — Z23 Encounter for immunization: Secondary | ICD-10-CM | POA: Diagnosis not present

## 2016-04-17 DIAGNOSIS — N186 End stage renal disease: Secondary | ICD-10-CM | POA: Diagnosis not present

## 2016-04-17 DIAGNOSIS — N2581 Secondary hyperparathyroidism of renal origin: Secondary | ICD-10-CM | POA: Diagnosis not present

## 2016-04-17 LAB — BRAIN NATRIURETIC PEPTIDE: B Natriuretic Peptide: 1420.6 pg/mL — ABNORMAL HIGH (ref 0.0–100.0)

## 2016-04-17 LAB — COMPREHENSIVE METABOLIC PANEL
ALBUMIN: 4 g/dL (ref 3.5–5.0)
ALK PHOS: 91 U/L (ref 38–126)
ALT: 16 U/L — ABNORMAL LOW (ref 17–63)
ANION GAP: 12 (ref 5–15)
AST: 19 U/L (ref 15–41)
BILIRUBIN TOTAL: 1.2 mg/dL (ref 0.3–1.2)
BUN: 39 mg/dL — AB (ref 6–20)
CO2: 31 mmol/L (ref 22–32)
Calcium: 9.3 mg/dL (ref 8.9–10.3)
Chloride: 99 mmol/L — ABNORMAL LOW (ref 101–111)
Creatinine, Ser: 7.04 mg/dL — ABNORMAL HIGH (ref 0.61–1.24)
GFR calc Af Amer: 9 mL/min — ABNORMAL LOW (ref >60)
GFR calc non Af Amer: 8 mL/min — ABNORMAL LOW (ref >60)
GLUCOSE: 106 mg/dL — AB (ref 65–99)
POTASSIUM: 5.1 mmol/L (ref 3.5–5.1)
SODIUM: 142 mmol/L (ref 135–145)
TOTAL PROTEIN: 7.9 g/dL (ref 6.5–8.1)

## 2016-04-17 LAB — CBC WITH DIFFERENTIAL/PLATELET
BASOS ABS: 0 10*3/uL (ref 0.0–0.1)
BASOS PCT: 0 %
EOS ABS: 0.3 10*3/uL (ref 0.0–0.7)
Eosinophils Relative: 6 %
HEMATOCRIT: 35.2 % — AB (ref 39.0–52.0)
HEMOGLOBIN: 10.4 g/dL — AB (ref 13.0–17.0)
Lymphocytes Relative: 15 %
Lymphs Abs: 0.9 10*3/uL (ref 0.7–4.0)
MCH: 24 pg — ABNORMAL LOW (ref 26.0–34.0)
MCHC: 29.5 g/dL — ABNORMAL LOW (ref 30.0–36.0)
MCV: 81.3 fL (ref 78.0–100.0)
MONO ABS: 0.7 10*3/uL (ref 0.1–1.0)
MONOS PCT: 11 %
NEUTROS PCT: 68 %
Neutro Abs: 4.1 10*3/uL (ref 1.7–7.7)
Platelets: 135 10*3/uL — ABNORMAL LOW (ref 150–400)
RBC: 4.33 MIL/uL (ref 4.22–5.81)
RDW: 19.2 % — AB (ref 11.5–15.5)
WBC: 6 10*3/uL (ref 4.0–10.5)

## 2016-04-17 LAB — TROPONIN I: Troponin I: 0.03 ng/mL (ref <0.03)

## 2016-04-17 MED ORDER — ALBUTEROL SULFATE (2.5 MG/3ML) 0.083% IN NEBU
5.0000 mg | INHALATION_SOLUTION | Freq: Once | RESPIRATORY_TRACT | Status: AC
  Administered 2016-04-17: 5 mg via RESPIRATORY_TRACT
  Filled 2016-04-17: qty 6

## 2016-04-17 NOTE — ED Notes (Signed)
Pt stable, ambulatory, states understanding of discharge instructions 

## 2016-04-17 NOTE — ED Notes (Signed)
Per [facility] received all but 10 minutes of dialysis treatment today, facility states for the last two weeks they haven't been able to get him down to his dry weight.  Patient complains of shortness of breath.  Patient also states abdomen is swollen and more firm than usual.  O2 saturation 98% on room air.

## 2016-04-18 NOTE — ED Provider Notes (Signed)
CSN: [id number]     Arrival date & time 04/17/16  1726 History   First MD Initiated Contact with Patient 04/17/16 1727     Chief Complaint  Patient presents with  . Shortness of Breath     (Consider location/radiation/quality/duration/timing/severity/associated sxs/prior Treatment) Patient is a 60 y.o. male presenting with shortness of breath and leg pain.  Shortness of Breath Associated symptoms: no fever   Leg Pain Injury: no   Pain details:    Quality:  Aching and sharp   Radiates to:  Does not radiate   Severity:  Mild   Onset quality:  Gradual   Timing:  Constant Chronicity:  New Dislocation: no   Associated symptoms: no fever     Past Medical History  Diagnosis Date  . Hypertension   . Hematochezia     a. 2014: colonscopy, which showed moderately-sized internal hemorrhoids, two 77mm polyps in transverse colon and ascending colon that were resected, five 2-64mm polyps in sigmoid colon, descending colon, transverse colon, and ascending colon that were resected. An upper endoscopy was performed and showed normal esophagus, stomach, and duodenum.  . Hematuria     a. H/o hematuria 2014 with cystoscopy that was unrevealing for his source of hematuria. He underwent a kidney ultrasound on 10/14 that showed mildly echogenic and scarred kidneys compatible with medical renal disease, without hydronephrosis or renal calculi.  [name] Anemia   . CAD (coronary artery disease)     a. per [facility] s/p 3.34mm x 61mm Vision BMS to mid LAD 12/2009 and Xience DES to mid LAD 10/2010.  . Colon polyps   . Chronic diastolic CHF (congestive heart failure) ([place])   . Hyperlipidemia   . Anginal pain ([place])   . Heart murmur   . 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 Type II diabetes mellitus ([place])   . History of blood transfusion     "had colonoscopy done; they had to give me some blood"  . Daily headache   . ESRD on dialysis [facility]) since ~ 2008    "[place]; TTS"  (07/21/2015)  . On home oxygen therapy     "2L prn" (07/21/2015)  . Renal insufficiency    Past Surgical History  Procedure Laterality Date  . Left heart catheterization with coronary angiogram N/A 11/23/2014    Procedure: LEFT HEART CATHETERIZATION WITH CORONARY ANGIOGRAM;  Surgeon: Troy Sine, MD;  Location: [facility] CATH LAB;  Service: Cardiovascular;  Laterality: N/A;  . Lithotripsy  X1  . Cystoscopy w/ stone manipulation  X2?  . Cardiac catheterization  "several"  . Coronary angioplasty with stent placement  "several"  . Eye surgery Bilateral     "laser OR for hemorrhage"  . Av fistula placement Left ~ 2007    "upper arm"   Family History  Problem Relation Age of Onset  . Hypertension    . Bone cancer Mother   . Anuerysm Father   . Diabetes type II Daughter    Social History  Substance Use Topics  . Smoking status: Former Smoker -- 0.50 packs/day for 8 years    Types: Cigarettes    Quit date: 12/06/2010  . Smokeless tobacco: Never Used  . Alcohol Use: No    Review of Systems  Constitutional: Negative for fever and chills.  Eyes: Negative for pain.  Respiratory: Positive for shortness of breath.   All other systems reviewed and are negative.     Allergies  Enalapril  Home Medications   Prior to Admission medications   Medication Sig Start Date End Date Taking? Authorizing Provider  albuterol (PROVENTIL HFA;VENTOLIN HFA) 108 (90 Base) MCG/ACT inhaler Inhale 1-2 puffs into the lungs every 6 (six) hours as needed for wheezing or shortness of breath. 01/19/16  Yes Delsa Grana, PA-C  amLODipine (NORVASC) 10 MG tablet Take 1 tablet (10 mg total) by mouth daily. 10/30/15  Yes Ripudeep Krystal Eaton, MD  aspirin 81 MG chewable tablet Chew 1 tablet (81 mg total) by mouth daily. 07/22/15  Yes Iline Oven, MD  atorvastatin (LIPITOR) 20 MG tablet Take 20 mg by mouth at bedtime. Reported on 03/14/2016   Yes Historical Provider, MD  carvedilol (COREG) 25 MG tablet Take 1 tablet (25  mg total) by mouth 2 (two) times daily with a meal. 03/12/16  Yes Geradine Girt, DO  cinacalcet (SENSIPAR) 90 MG tablet Take 90 mg by mouth at bedtime.    Yes Historical Provider, MD  esomeprazole (NEXIUM) 20 MG capsule Take 1 capsule (20 mg total) by mouth daily. Patient taking differently: Take 20 mg by mouth daily with supper.  04/02/16 04/02/17 Yes Maryellen Pile, MD  losartan (COZAAR) 100 MG tablet Take 100 mg by mouth at bedtime. 04/11/16  Yes Historical Provider, MD  multivitamin (RENA-VIT) TABS tablet Take 1 tablet by mouth at bedtime. 12/15/14  Yes Geradine Girt, DO  OXYGEN Inhale 2 L into the lungs at bedtime.    Yes Historical Provider, MD  pregabalin (LYRICA) 50 MG capsule Take 1 capsule (50 mg total) by mouth daily. Patient taking differently: Take 50 mg by mouth at bedtime.  12/15/14  Yes Geradine Girt, DO  sevelamer carbonate (RENVELA) 800 MG tablet Take 4 tablets (3,200 mg total) by mouth 3 (three) times daily with meals. Patient taking differently: Take 1,600-3,200 mg by mouth See admin instructions. Take 4 tablets (3200 mg) by mouth with meals and 2 tablets (1600 mg) with snacks 03/12/16  Yes Geradine Girt, DO  venlafaxine XR (EFFEXOR XR) 75 MG 24 hr capsule Take 1 capsule (75 mg total) by mouth daily with breakfast. 07/27/15  Yes Shela Leff, MD  acetaminophen (TYLENOL) 325 MG tablet Take 2 tablets (650 mg total) by mouth every 6 (six) hours as needed for mild pain (or Fever >/= 101). 03/27/16   Kinnie Feil, MD  amitriptyline (ELAVIL) 100 MG tablet Take 1 tablet (100 mg total) by mouth at bedtime. Patient not taking: Reported on 04/17/2016 07/27/15   Shela Leff, MD  hydrALAZINE (APRESOLINE) 25 MG tablet Take 1 tablet (25 mg total) by mouth 3 (three) times daily. Patient not taking: Reported on 04/17/2016 03/18/16   Reyne Dumas, MD   BP 167/94 mmHg  Pulse 94  Resp 18  SpO2 100% Physical Exam  Constitutional: He is oriented to person, place, and time. He appears  well-developed and well-nourished.  HENT:  Head: Normocephalic and atraumatic.  Neck: Normal range of motion.  Cardiovascular: Normal rate.   Pulmonary/Chest: Effort normal. No respiratory distress.  Abdominal: Soft. He exhibits no distension. There is no tenderness. There is no rebound.  Musculoskeletal: Normal range of motion. He exhibits no edema or tenderness.  Neurological: He is alert and oriented to person, place, and time.  Skin: Skin is warm and dry.  Nursing note and vitals reviewed.   ED Course  Procedures (including critical care time) Labs Review Labs Reviewed  CBC WITH DIFFERENTIAL/PLATELET - Abnormal; Notable for the following:  Hemoglobin 10.4 (*)    HCT 35.2 (*)    MCH 24.0 (*)    MCHC 29.5 (*)    RDW 19.2 (*)    Platelets 135 (*)    All other components within normal limits  COMPREHENSIVE METABOLIC PANEL - Abnormal; Notable for the following:    Chloride 99 (*)    Glucose, Bld 106 (*)    BUN 39 (*)    Creatinine, Ser 7.04 (*)    ALT 16 (*)    GFR calc non Af Amer 8 (*)    GFR calc Af Amer 9 (*)    All other components within normal limits  TROPONIN I - Abnormal; Notable for the following:    Troponin I 0.03 (*)    All other components within normal limits  BRAIN NATRIURETIC PEPTIDE - Abnormal; Notable for the following:    B Natriuretic Peptide 1420.6 (*)    All other components within normal limits    Imaging Review Dg Chest 2 View  04/17/2016  CLINICAL DATA:  Shortness of breath for 1 day EXAM: CHEST  2 VIEW COMPARISON:  April 14, 2016 FINDINGS: The heart size and mediastinal contours are stable. The heart size is mildly enlarged. There is mild increased pulmonary interstitium. There is no focal pneumonia or pleural effusion. The visualized skeletal structures are stable. IMPRESSION: Cardiomegaly and mild interstitial edema stable compared prior exam. Electronically Signed   By: Abelardo Diesel M.D.   On: 04/17/2016 18:51   I have personally reviewed  and evaluated these images and lab results as part of my medical decision-making.   EKG Interpretation   Date/Time:  Tuesday April 17 2016 17:33:41 EDT Ventricular Rate:  98 PR Interval:    QRS Duration: 99 QT Interval:  377 QTC Calculation: 482 R Axis:   87 Text Interpretation:  Sinus rhythm Atrial premature complex Borderline  prolonged QT interval Confirmed by Saysha Menta MD, Corene Cornea [phone]) on 04/17/2016  6:12:14 PM      MDM   Final diagnoses:  Hypervolemia, unspecified hypervolemia type    60 year old male here because he thinks that he is to volume overload. No significant source of breath at this time. He has some lower extremity edema and he was to be brought to his dry weight. He was admitted to the hospitalist ischemic done tonight. I explained to him that is not something we do this is emergent reason. I discussed we would recheck his labs and his chest x-ray. Prior to all labs coming back patient wanted to leave. He had low risk of emergent problems so was discharged.    Merrily Pew, MD 04/18/16 [phone]

## 2016-04-19 DIAGNOSIS — D631 Anemia in chronic kidney disease: Secondary | ICD-10-CM | POA: Diagnosis not present

## 2016-04-19 DIAGNOSIS — Z23 Encounter for immunization: Secondary | ICD-10-CM | POA: Diagnosis not present

## 2016-04-19 DIAGNOSIS — N2581 Secondary hyperparathyroidism of renal origin: Secondary | ICD-10-CM | POA: Diagnosis not present

## 2016-04-19 DIAGNOSIS — N186 End stage renal disease: Secondary | ICD-10-CM | POA: Diagnosis not present

## 2016-04-19 DIAGNOSIS — D509 Iron deficiency anemia, unspecified: Secondary | ICD-10-CM | POA: Diagnosis not present

## 2016-04-19 DIAGNOSIS — E119 Type 2 diabetes mellitus without complications: Secondary | ICD-10-CM | POA: Diagnosis not present

## 2016-04-21 DIAGNOSIS — Z23 Encounter for immunization: Secondary | ICD-10-CM | POA: Diagnosis not present

## 2016-04-21 DIAGNOSIS — D631 Anemia in chronic kidney disease: Secondary | ICD-10-CM | POA: Diagnosis not present

## 2016-04-21 DIAGNOSIS — N2581 Secondary hyperparathyroidism of renal origin: Secondary | ICD-10-CM | POA: Diagnosis not present

## 2016-04-21 DIAGNOSIS — E119 Type 2 diabetes mellitus without complications: Secondary | ICD-10-CM | POA: Diagnosis not present

## 2016-04-21 DIAGNOSIS — N186 End stage renal disease: Secondary | ICD-10-CM | POA: Diagnosis not present

## 2016-04-21 DIAGNOSIS — D509 Iron deficiency anemia, unspecified: Secondary | ICD-10-CM | POA: Diagnosis not present

## 2016-04-24 DIAGNOSIS — D631 Anemia in chronic kidney disease: Secondary | ICD-10-CM | POA: Diagnosis not present

## 2016-04-24 DIAGNOSIS — E119 Type 2 diabetes mellitus without complications: Secondary | ICD-10-CM | POA: Diagnosis not present

## 2016-04-24 DIAGNOSIS — N2581 Secondary hyperparathyroidism of renal origin: Secondary | ICD-10-CM | POA: Diagnosis not present

## 2016-04-24 DIAGNOSIS — Z23 Encounter for immunization: Secondary | ICD-10-CM | POA: Diagnosis not present

## 2016-04-24 DIAGNOSIS — N186 End stage renal disease: Secondary | ICD-10-CM | POA: Diagnosis not present

## 2016-04-24 DIAGNOSIS — D509 Iron deficiency anemia, unspecified: Secondary | ICD-10-CM | POA: Diagnosis not present

## 2016-04-26 DIAGNOSIS — I132 Hypertensive heart and chronic kidney disease with heart failure and with stage 5 chronic kidney disease, or end stage renal disease: Secondary | ICD-10-CM | POA: Diagnosis not present

## 2016-04-26 DIAGNOSIS — D696 Thrombocytopenia, unspecified: Secondary | ICD-10-CM | POA: Diagnosis not present

## 2016-04-26 DIAGNOSIS — R0602 Shortness of breath: Secondary | ICD-10-CM | POA: Diagnosis not present

## 2016-04-26 DIAGNOSIS — J811 Chronic pulmonary edema: Secondary | ICD-10-CM | POA: Diagnosis not present

## 2016-04-26 DIAGNOSIS — Z992 Dependence on renal dialysis: Secondary | ICD-10-CM | POA: Diagnosis not present

## 2016-04-26 DIAGNOSIS — E1122 Type 2 diabetes mellitus with diabetic chronic kidney disease: Secondary | ICD-10-CM | POA: Diagnosis not present

## 2016-04-26 DIAGNOSIS — I2510 Atherosclerotic heart disease of native coronary artery without angina pectoris: Secondary | ICD-10-CM | POA: Diagnosis not present

## 2016-04-26 DIAGNOSIS — R0989 Other specified symptoms and signs involving the circulatory and respiratory systems: Secondary | ICD-10-CM | POA: Diagnosis not present

## 2016-04-26 DIAGNOSIS — R079 Chest pain, unspecified: Secondary | ICD-10-CM | POA: Diagnosis not present

## 2016-04-26 DIAGNOSIS — R600 Localized edema: Secondary | ICD-10-CM | POA: Diagnosis not present

## 2016-04-26 DIAGNOSIS — R0789 Other chest pain: Secondary | ICD-10-CM | POA: Diagnosis not present

## 2016-04-26 DIAGNOSIS — N186 End stage renal disease: Secondary | ICD-10-CM | POA: Diagnosis not present

## 2016-04-26 DIAGNOSIS — I5042 Chronic combined systolic (congestive) and diastolic (congestive) heart failure: Secondary | ICD-10-CM | POA: Diagnosis not present

## 2016-04-27 DIAGNOSIS — E1122 Type 2 diabetes mellitus with diabetic chronic kidney disease: Secondary | ICD-10-CM | POA: Diagnosis present

## 2016-04-27 DIAGNOSIS — Z87891 Personal history of nicotine dependence: Secondary | ICD-10-CM | POA: Diagnosis not present

## 2016-04-27 DIAGNOSIS — I132 Hypertensive heart and chronic kidney disease with heart failure and with stage 5 chronic kidney disease, or end stage renal disease: Secondary | ICD-10-CM | POA: Diagnosis present

## 2016-04-27 DIAGNOSIS — I252 Old myocardial infarction: Secondary | ICD-10-CM | POA: Diagnosis not present

## 2016-04-27 DIAGNOSIS — D696 Thrombocytopenia, unspecified: Secondary | ICD-10-CM | POA: Diagnosis not present

## 2016-04-27 DIAGNOSIS — N186 End stage renal disease: Secondary | ICD-10-CM | POA: Diagnosis not present

## 2016-04-27 DIAGNOSIS — F329 Major depressive disorder, single episode, unspecified: Secondary | ICD-10-CM | POA: Diagnosis present

## 2016-04-27 DIAGNOSIS — Z7982 Long term (current) use of aspirin: Secondary | ICD-10-CM | POA: Diagnosis not present

## 2016-04-27 DIAGNOSIS — Z992 Dependence on renal dialysis: Secondary | ICD-10-CM | POA: Diagnosis not present

## 2016-04-27 DIAGNOSIS — R079 Chest pain, unspecified: Secondary | ICD-10-CM | POA: Diagnosis not present

## 2016-04-27 DIAGNOSIS — I2510 Atherosclerotic heart disease of native coronary artery without angina pectoris: Secondary | ICD-10-CM | POA: Diagnosis present

## 2016-04-27 DIAGNOSIS — K219 Gastro-esophageal reflux disease without esophagitis: Secondary | ICD-10-CM | POA: Diagnosis present

## 2016-04-27 DIAGNOSIS — E1142 Type 2 diabetes mellitus with diabetic polyneuropathy: Secondary | ICD-10-CM | POA: Diagnosis present

## 2016-04-27 DIAGNOSIS — I5042 Chronic combined systolic (congestive) and diastolic (congestive) heart failure: Secondary | ICD-10-CM | POA: Diagnosis present

## 2016-04-27 DIAGNOSIS — Z8669 Personal history of other diseases of the nervous system and sense organs: Secondary | ICD-10-CM | POA: Diagnosis not present

## 2016-04-27 DIAGNOSIS — I4589 Other specified conduction disorders: Secondary | ICD-10-CM | POA: Diagnosis not present

## 2016-05-01 DIAGNOSIS — N186 End stage renal disease: Secondary | ICD-10-CM | POA: Diagnosis not present

## 2016-05-01 DIAGNOSIS — N2581 Secondary hyperparathyroidism of renal origin: Secondary | ICD-10-CM | POA: Diagnosis not present

## 2016-05-03 DIAGNOSIS — N186 End stage renal disease: Secondary | ICD-10-CM | POA: Diagnosis not present

## 2016-05-03 DIAGNOSIS — N2581 Secondary hyperparathyroidism of renal origin: Secondary | ICD-10-CM | POA: Diagnosis not present

## 2016-05-05 DIAGNOSIS — N2581 Secondary hyperparathyroidism of renal origin: Secondary | ICD-10-CM | POA: Diagnosis not present

## 2016-05-05 DIAGNOSIS — N186 End stage renal disease: Secondary | ICD-10-CM | POA: Diagnosis not present

## 2016-05-06 DIAGNOSIS — I340 Nonrheumatic mitral (valve) insufficiency: Secondary | ICD-10-CM | POA: Diagnosis not present

## 2016-05-06 DIAGNOSIS — D696 Thrombocytopenia, unspecified: Secondary | ICD-10-CM | POA: Diagnosis not present

## 2016-05-06 DIAGNOSIS — Z955 Presence of coronary angioplasty implant and graft: Secondary | ICD-10-CM | POA: Diagnosis not present

## 2016-05-06 DIAGNOSIS — R0789 Other chest pain: Secondary | ICD-10-CM | POA: Diagnosis not present

## 2016-05-06 DIAGNOSIS — N186 End stage renal disease: Secondary | ICD-10-CM | POA: Diagnosis not present

## 2016-05-06 DIAGNOSIS — I5030 Unspecified diastolic (congestive) heart failure: Secondary | ICD-10-CM | POA: Diagnosis not present

## 2016-05-06 DIAGNOSIS — Z992 Dependence on renal dialysis: Secondary | ICD-10-CM | POA: Diagnosis not present

## 2016-05-06 DIAGNOSIS — Z7982 Long term (current) use of aspirin: Secondary | ICD-10-CM | POA: Diagnosis not present

## 2016-05-06 DIAGNOSIS — I132 Hypertensive heart and chronic kidney disease with heart failure and with stage 5 chronic kidney disease, or end stage renal disease: Secondary | ICD-10-CM | POA: Diagnosis not present

## 2016-05-06 DIAGNOSIS — I2510 Atherosclerotic heart disease of native coronary artery without angina pectoris: Secondary | ICD-10-CM | POA: Diagnosis not present

## 2016-05-06 DIAGNOSIS — I120 Hypertensive chronic kidney disease with stage 5 chronic kidney disease or end stage renal disease: Secondary | ICD-10-CM | POA: Diagnosis not present

## 2016-05-06 DIAGNOSIS — K219 Gastro-esophageal reflux disease without esophagitis: Secondary | ICD-10-CM | POA: Diagnosis not present

## 2016-05-06 DIAGNOSIS — E1142 Type 2 diabetes mellitus with diabetic polyneuropathy: Secondary | ICD-10-CM | POA: Diagnosis not present

## 2016-05-06 DIAGNOSIS — E1122 Type 2 diabetes mellitus with diabetic chronic kidney disease: Secondary | ICD-10-CM | POA: Diagnosis not present

## 2016-05-06 DIAGNOSIS — E785 Hyperlipidemia, unspecified: Secondary | ICD-10-CM | POA: Diagnosis not present

## 2016-05-06 DIAGNOSIS — R011 Cardiac murmur, unspecified: Secondary | ICD-10-CM | POA: Diagnosis not present

## 2016-05-06 DIAGNOSIS — Z79899 Other long term (current) drug therapy: Secondary | ICD-10-CM | POA: Diagnosis not present

## 2016-05-07 DIAGNOSIS — Z992 Dependence on renal dialysis: Secondary | ICD-10-CM | POA: Diagnosis not present

## 2016-05-07 DIAGNOSIS — R1110 Vomiting, unspecified: Secondary | ICD-10-CM | POA: Diagnosis not present

## 2016-05-07 DIAGNOSIS — I120 Hypertensive chronic kidney disease with stage 5 chronic kidney disease or end stage renal disease: Secondary | ICD-10-CM | POA: Diagnosis not present

## 2016-05-07 DIAGNOSIS — N186 End stage renal disease: Secondary | ICD-10-CM | POA: Diagnosis not present

## 2016-05-07 DIAGNOSIS — E1122 Type 2 diabetes mellitus with diabetic chronic kidney disease: Secondary | ICD-10-CM | POA: Diagnosis not present

## 2016-05-07 DIAGNOSIS — I200 Unstable angina: Secondary | ICD-10-CM | POA: Diagnosis not present

## 2016-05-08 DIAGNOSIS — N186 End stage renal disease: Secondary | ICD-10-CM | POA: Diagnosis not present

## 2016-05-08 DIAGNOSIS — N2581 Secondary hyperparathyroidism of renal origin: Secondary | ICD-10-CM | POA: Diagnosis not present

## 2016-05-08 DIAGNOSIS — D631 Anemia in chronic kidney disease: Secondary | ICD-10-CM | POA: Diagnosis not present

## 2016-05-10 DIAGNOSIS — E119 Type 2 diabetes mellitus without complications: Secondary | ICD-10-CM | POA: Diagnosis not present

## 2016-05-10 DIAGNOSIS — N186 End stage renal disease: Secondary | ICD-10-CM | POA: Diagnosis not present

## 2016-05-10 DIAGNOSIS — N2581 Secondary hyperparathyroidism of renal origin: Secondary | ICD-10-CM | POA: Diagnosis not present

## 2016-05-10 DIAGNOSIS — D631 Anemia in chronic kidney disease: Secondary | ICD-10-CM | POA: Diagnosis not present

## 2016-05-11 DIAGNOSIS — R0602 Shortness of breath: Secondary | ICD-10-CM | POA: Diagnosis not present

## 2016-05-11 DIAGNOSIS — K219 Gastro-esophageal reflux disease without esophagitis: Secondary | ICD-10-CM | POA: Diagnosis not present

## 2016-05-11 DIAGNOSIS — I509 Heart failure, unspecified: Secondary | ICD-10-CM | POA: Diagnosis not present

## 2016-05-11 DIAGNOSIS — J811 Chronic pulmonary edema: Secondary | ICD-10-CM | POA: Diagnosis not present

## 2016-05-11 DIAGNOSIS — Z87891 Personal history of nicotine dependence: Secondary | ICD-10-CM | POA: Diagnosis not present

## 2016-05-11 DIAGNOSIS — F329 Major depressive disorder, single episode, unspecified: Secondary | ICD-10-CM | POA: Diagnosis not present

## 2016-05-11 DIAGNOSIS — I110 Hypertensive heart disease with heart failure: Secondary | ICD-10-CM | POA: Diagnosis not present

## 2016-05-11 DIAGNOSIS — G8929 Other chronic pain: Secondary | ICD-10-CM | POA: Diagnosis not present

## 2016-05-11 DIAGNOSIS — E8770 Fluid overload, unspecified: Secondary | ICD-10-CM | POA: Diagnosis not present

## 2016-05-11 DIAGNOSIS — N186 End stage renal disease: Secondary | ICD-10-CM | POA: Diagnosis not present

## 2016-05-11 DIAGNOSIS — R0989 Other specified symptoms and signs involving the circulatory and respiratory systems: Secondary | ICD-10-CM | POA: Diagnosis not present

## 2016-05-11 DIAGNOSIS — I2510 Atherosclerotic heart disease of native coronary artery without angina pectoris: Secondary | ICD-10-CM | POA: Diagnosis not present

## 2016-05-11 DIAGNOSIS — R918 Other nonspecific abnormal finding of lung field: Secondary | ICD-10-CM | POA: Diagnosis not present

## 2016-05-11 DIAGNOSIS — Z992 Dependence on renal dialysis: Secondary | ICD-10-CM | POA: Diagnosis not present

## 2016-05-11 DIAGNOSIS — I132 Hypertensive heart and chronic kidney disease with heart failure and with stage 5 chronic kidney disease, or end stage renal disease: Secondary | ICD-10-CM | POA: Diagnosis not present

## 2016-05-11 DIAGNOSIS — I160 Hypertensive urgency: Secondary | ICD-10-CM | POA: Diagnosis not present

## 2016-05-11 DIAGNOSIS — M859 Disorder of bone density and structure, unspecified: Secondary | ICD-10-CM | POA: Diagnosis not present

## 2016-05-11 DIAGNOSIS — E1122 Type 2 diabetes mellitus with diabetic chronic kidney disease: Secondary | ICD-10-CM | POA: Diagnosis not present

## 2016-05-11 DIAGNOSIS — D649 Anemia, unspecified: Secondary | ICD-10-CM | POA: Diagnosis not present

## 2016-05-11 DIAGNOSIS — Z79899 Other long term (current) drug therapy: Secondary | ICD-10-CM | POA: Diagnosis not present

## 2016-05-11 DIAGNOSIS — Z7409 Other reduced mobility: Secondary | ICD-10-CM | POA: Diagnosis not present

## 2016-05-11 DIAGNOSIS — I5042 Chronic combined systolic (congestive) and diastolic (congestive) heart failure: Secondary | ICD-10-CM | POA: Diagnosis not present

## 2016-05-11 DIAGNOSIS — Z955 Presence of coronary angioplasty implant and graft: Secondary | ICD-10-CM | POA: Diagnosis not present

## 2016-05-11 DIAGNOSIS — Z7982 Long term (current) use of aspirin: Secondary | ICD-10-CM | POA: Diagnosis not present

## 2016-05-12 DIAGNOSIS — Z992 Dependence on renal dialysis: Secondary | ICD-10-CM | POA: Diagnosis not present

## 2016-05-12 DIAGNOSIS — N250 Renal osteodystrophy: Secondary | ICD-10-CM | POA: Diagnosis not present

## 2016-05-12 DIAGNOSIS — D631 Anemia in chronic kidney disease: Secondary | ICD-10-CM | POA: Diagnosis not present

## 2016-05-12 DIAGNOSIS — I120 Hypertensive chronic kidney disease with stage 5 chronic kidney disease or end stage renal disease: Secondary | ICD-10-CM | POA: Diagnosis not present

## 2016-05-12 DIAGNOSIS — E8770 Fluid overload, unspecified: Secondary | ICD-10-CM | POA: Diagnosis not present

## 2016-05-12 DIAGNOSIS — R0602 Shortness of breath: Secondary | ICD-10-CM | POA: Diagnosis not present

## 2016-05-12 DIAGNOSIS — N186 End stage renal disease: Secondary | ICD-10-CM | POA: Diagnosis not present

## 2016-05-13 DIAGNOSIS — R0602 Shortness of breath: Secondary | ICD-10-CM | POA: Diagnosis not present

## 2016-05-13 DIAGNOSIS — N184 Chronic kidney disease, stage 4 (severe): Secondary | ICD-10-CM | POA: Diagnosis not present

## 2016-05-15 DIAGNOSIS — N186 End stage renal disease: Secondary | ICD-10-CM | POA: Diagnosis not present

## 2016-05-15 DIAGNOSIS — D631 Anemia in chronic kidney disease: Secondary | ICD-10-CM | POA: Diagnosis not present

## 2016-05-15 DIAGNOSIS — N2581 Secondary hyperparathyroidism of renal origin: Secondary | ICD-10-CM | POA: Diagnosis not present

## 2016-05-17 DIAGNOSIS — D631 Anemia in chronic kidney disease: Secondary | ICD-10-CM | POA: Diagnosis not present

## 2016-05-17 DIAGNOSIS — N2581 Secondary hyperparathyroidism of renal origin: Secondary | ICD-10-CM | POA: Diagnosis not present

## 2016-05-17 DIAGNOSIS — N186 End stage renal disease: Secondary | ICD-10-CM | POA: Diagnosis not present

## 2016-05-20 DIAGNOSIS — E8770 Fluid overload, unspecified: Secondary | ICD-10-CM | POA: Diagnosis not present

## 2016-05-20 DIAGNOSIS — Z9115 Patient's noncompliance with renal dialysis: Secondary | ICD-10-CM | POA: Diagnosis not present

## 2016-05-20 DIAGNOSIS — A084 Viral intestinal infection, unspecified: Secondary | ICD-10-CM | POA: Diagnosis not present

## 2016-05-20 DIAGNOSIS — R918 Other nonspecific abnormal finding of lung field: Secondary | ICD-10-CM | POA: Diagnosis not present

## 2016-05-20 DIAGNOSIS — F329 Major depressive disorder, single episode, unspecified: Secondary | ICD-10-CM | POA: Diagnosis not present

## 2016-05-20 DIAGNOSIS — D649 Anemia, unspecified: Secondary | ICD-10-CM | POA: Diagnosis not present

## 2016-05-20 DIAGNOSIS — E1142 Type 2 diabetes mellitus with diabetic polyneuropathy: Secondary | ICD-10-CM | POA: Diagnosis not present

## 2016-05-20 DIAGNOSIS — Z87891 Personal history of nicotine dependence: Secondary | ICD-10-CM | POA: Diagnosis not present

## 2016-05-20 DIAGNOSIS — G8929 Other chronic pain: Secondary | ICD-10-CM | POA: Diagnosis not present

## 2016-05-20 DIAGNOSIS — Z992 Dependence on renal dialysis: Secondary | ICD-10-CM | POA: Diagnosis not present

## 2016-05-20 DIAGNOSIS — K219 Gastro-esophageal reflux disease without esophagitis: Secondary | ICD-10-CM | POA: Diagnosis not present

## 2016-05-20 DIAGNOSIS — I4589 Other specified conduction disorders: Secondary | ICD-10-CM | POA: Diagnosis not present

## 2016-05-20 DIAGNOSIS — I132 Hypertensive heart and chronic kidney disease with heart failure and with stage 5 chronic kidney disease, or end stage renal disease: Secondary | ICD-10-CM | POA: Diagnosis not present

## 2016-05-20 DIAGNOSIS — R0902 Hypoxemia: Secondary | ICD-10-CM | POA: Diagnosis not present

## 2016-05-20 DIAGNOSIS — E875 Hyperkalemia: Secondary | ICD-10-CM | POA: Diagnosis not present

## 2016-05-20 DIAGNOSIS — R079 Chest pain, unspecified: Secondary | ICD-10-CM | POA: Diagnosis not present

## 2016-05-20 DIAGNOSIS — R9431 Abnormal electrocardiogram [ECG] [EKG]: Secondary | ICD-10-CM | POA: Diagnosis not present

## 2016-05-20 DIAGNOSIS — J9811 Atelectasis: Secondary | ICD-10-CM | POA: Diagnosis not present

## 2016-05-20 DIAGNOSIS — E1122 Type 2 diabetes mellitus with diabetic chronic kidney disease: Secondary | ICD-10-CM | POA: Diagnosis not present

## 2016-05-20 DIAGNOSIS — I120 Hypertensive chronic kidney disease with stage 5 chronic kidney disease or end stage renal disease: Secondary | ICD-10-CM | POA: Diagnosis not present

## 2016-05-20 DIAGNOSIS — D631 Anemia in chronic kidney disease: Secondary | ICD-10-CM | POA: Diagnosis not present

## 2016-05-20 DIAGNOSIS — R0989 Other specified symptoms and signs involving the circulatory and respiratory systems: Secondary | ICD-10-CM | POA: Diagnosis not present

## 2016-05-20 DIAGNOSIS — J810 Acute pulmonary edema: Secondary | ICD-10-CM | POA: Diagnosis not present

## 2016-05-20 DIAGNOSIS — R0602 Shortness of breath: Secondary | ICD-10-CM | POA: Diagnosis not present

## 2016-05-20 DIAGNOSIS — R000 Tachycardia, unspecified: Secondary | ICD-10-CM | POA: Diagnosis not present

## 2016-05-20 DIAGNOSIS — J3489 Other specified disorders of nose and nasal sinuses: Secondary | ICD-10-CM | POA: Diagnosis not present

## 2016-05-20 DIAGNOSIS — I5042 Chronic combined systolic (congestive) and diastolic (congestive) heart failure: Secondary | ICD-10-CM | POA: Diagnosis not present

## 2016-05-20 DIAGNOSIS — N186 End stage renal disease: Secondary | ICD-10-CM | POA: Diagnosis not present

## 2016-05-20 DIAGNOSIS — J811 Chronic pulmonary edema: Secondary | ICD-10-CM | POA: Diagnosis not present

## 2016-05-20 DIAGNOSIS — I2510 Atherosclerotic heart disease of native coronary artery without angina pectoris: Secondary | ICD-10-CM | POA: Diagnosis not present

## 2016-05-21 DIAGNOSIS — N186 End stage renal disease: Secondary | ICD-10-CM | POA: Diagnosis not present

## 2016-05-21 DIAGNOSIS — Z992 Dependence on renal dialysis: Secondary | ICD-10-CM | POA: Diagnosis not present

## 2016-05-21 DIAGNOSIS — R0602 Shortness of breath: Secondary | ICD-10-CM | POA: Diagnosis not present

## 2016-05-22 DIAGNOSIS — D631 Anemia in chronic kidney disease: Secondary | ICD-10-CM | POA: Diagnosis not present

## 2016-05-22 DIAGNOSIS — N2581 Secondary hyperparathyroidism of renal origin: Secondary | ICD-10-CM | POA: Diagnosis not present

## 2016-05-22 DIAGNOSIS — N186 End stage renal disease: Secondary | ICD-10-CM | POA: Diagnosis not present

## 2016-05-24 DIAGNOSIS — D631 Anemia in chronic kidney disease: Secondary | ICD-10-CM | POA: Diagnosis not present

## 2016-05-24 DIAGNOSIS — N2581 Secondary hyperparathyroidism of renal origin: Secondary | ICD-10-CM | POA: Diagnosis not present

## 2016-05-24 DIAGNOSIS — N186 End stage renal disease: Secondary | ICD-10-CM | POA: Diagnosis not present

## 2016-05-25 ENCOUNTER — Ambulatory Visit: Payer: Medicare Other

## 2016-05-25 ENCOUNTER — Encounter: Payer: Self-pay | Admitting: Internal Medicine

## 2016-05-25 DIAGNOSIS — Z87891 Personal history of nicotine dependence: Secondary | ICD-10-CM | POA: Diagnosis not present

## 2016-05-25 DIAGNOSIS — E1122 Type 2 diabetes mellitus with diabetic chronic kidney disease: Secondary | ICD-10-CM | POA: Diagnosis not present

## 2016-05-25 DIAGNOSIS — R079 Chest pain, unspecified: Secondary | ICD-10-CM | POA: Diagnosis not present

## 2016-05-25 DIAGNOSIS — I2510 Atherosclerotic heart disease of native coronary artery without angina pectoris: Secondary | ICD-10-CM | POA: Diagnosis not present

## 2016-05-25 DIAGNOSIS — I120 Hypertensive chronic kidney disease with stage 5 chronic kidney disease or end stage renal disease: Secondary | ICD-10-CM | POA: Diagnosis not present

## 2016-05-25 DIAGNOSIS — Z79899 Other long term (current) drug therapy: Secondary | ICD-10-CM | POA: Diagnosis not present

## 2016-05-25 DIAGNOSIS — Z992 Dependence on renal dialysis: Secondary | ICD-10-CM | POA: Diagnosis not present

## 2016-05-25 DIAGNOSIS — E785 Hyperlipidemia, unspecified: Secondary | ICD-10-CM | POA: Diagnosis not present

## 2016-05-25 DIAGNOSIS — N186 End stage renal disease: Secondary | ICD-10-CM | POA: Diagnosis not present

## 2016-05-26 DIAGNOSIS — N2581 Secondary hyperparathyroidism of renal origin: Secondary | ICD-10-CM | POA: Diagnosis not present

## 2016-05-26 DIAGNOSIS — N186 End stage renal disease: Secondary | ICD-10-CM | POA: Diagnosis not present

## 2016-05-26 DIAGNOSIS — D631 Anemia in chronic kidney disease: Secondary | ICD-10-CM | POA: Diagnosis not present

## 2016-05-29 DIAGNOSIS — N2581 Secondary hyperparathyroidism of renal origin: Secondary | ICD-10-CM | POA: Diagnosis not present

## 2016-05-29 DIAGNOSIS — N186 End stage renal disease: Secondary | ICD-10-CM | POA: Diagnosis not present

## 2016-05-29 DIAGNOSIS — D631 Anemia in chronic kidney disease: Secondary | ICD-10-CM | POA: Diagnosis not present

## 2016-05-31 DIAGNOSIS — N2581 Secondary hyperparathyroidism of renal origin: Secondary | ICD-10-CM | POA: Diagnosis not present

## 2016-05-31 DIAGNOSIS — D631 Anemia in chronic kidney disease: Secondary | ICD-10-CM | POA: Diagnosis not present

## 2016-05-31 DIAGNOSIS — N186 End stage renal disease: Secondary | ICD-10-CM | POA: Diagnosis not present

## 2016-06-02 DIAGNOSIS — N2581 Secondary hyperparathyroidism of renal origin: Secondary | ICD-10-CM | POA: Diagnosis not present

## 2016-06-02 DIAGNOSIS — D631 Anemia in chronic kidney disease: Secondary | ICD-10-CM | POA: Diagnosis not present

## 2016-06-02 DIAGNOSIS — N186 End stage renal disease: Secondary | ICD-10-CM | POA: Diagnosis not present

## 2016-06-05 DIAGNOSIS — N186 End stage renal disease: Secondary | ICD-10-CM | POA: Diagnosis not present

## 2016-06-05 DIAGNOSIS — D631 Anemia in chronic kidney disease: Secondary | ICD-10-CM | POA: Diagnosis not present

## 2016-06-05 DIAGNOSIS — N2581 Secondary hyperparathyroidism of renal origin: Secondary | ICD-10-CM | POA: Diagnosis not present

## 2016-06-07 DIAGNOSIS — N2581 Secondary hyperparathyroidism of renal origin: Secondary | ICD-10-CM | POA: Diagnosis not present

## 2016-06-07 DIAGNOSIS — Z992 Dependence on renal dialysis: Secondary | ICD-10-CM | POA: Diagnosis not present

## 2016-06-07 DIAGNOSIS — E1122 Type 2 diabetes mellitus with diabetic chronic kidney disease: Secondary | ICD-10-CM | POA: Diagnosis not present

## 2016-06-07 DIAGNOSIS — N186 End stage renal disease: Secondary | ICD-10-CM | POA: Diagnosis not present

## 2016-06-07 DIAGNOSIS — D631 Anemia in chronic kidney disease: Secondary | ICD-10-CM | POA: Diagnosis not present

## 2016-06-09 DIAGNOSIS — N186 End stage renal disease: Secondary | ICD-10-CM | POA: Diagnosis not present

## 2016-06-09 DIAGNOSIS — D631 Anemia in chronic kidney disease: Secondary | ICD-10-CM | POA: Diagnosis not present

## 2016-06-12 DIAGNOSIS — N186 End stage renal disease: Secondary | ICD-10-CM | POA: Diagnosis not present

## 2016-06-12 DIAGNOSIS — D631 Anemia in chronic kidney disease: Secondary | ICD-10-CM | POA: Diagnosis not present

## 2016-06-13 DIAGNOSIS — T82858A Stenosis of vascular prosthetic devices, implants and grafts, initial encounter: Secondary | ICD-10-CM | POA: Diagnosis not present

## 2016-06-13 DIAGNOSIS — I871 Compression of vein: Secondary | ICD-10-CM | POA: Diagnosis not present

## 2016-06-14 DIAGNOSIS — D631 Anemia in chronic kidney disease: Secondary | ICD-10-CM | POA: Diagnosis not present

## 2016-06-14 DIAGNOSIS — N186 End stage renal disease: Secondary | ICD-10-CM | POA: Diagnosis not present

## 2016-06-16 DIAGNOSIS — D631 Anemia in chronic kidney disease: Secondary | ICD-10-CM | POA: Diagnosis not present

## 2016-06-16 DIAGNOSIS — N186 End stage renal disease: Secondary | ICD-10-CM | POA: Diagnosis not present

## 2016-06-18 DIAGNOSIS — N186 End stage renal disease: Secondary | ICD-10-CM | POA: Diagnosis not present

## 2016-06-18 DIAGNOSIS — D631 Anemia in chronic kidney disease: Secondary | ICD-10-CM | POA: Diagnosis not present

## 2016-06-21 DIAGNOSIS — D631 Anemia in chronic kidney disease: Secondary | ICD-10-CM | POA: Diagnosis not present

## 2016-06-21 DIAGNOSIS — N186 End stage renal disease: Secondary | ICD-10-CM | POA: Diagnosis not present

## 2016-06-23 DIAGNOSIS — I2510 Atherosclerotic heart disease of native coronary artery without angina pectoris: Secondary | ICD-10-CM | POA: Diagnosis not present

## 2016-06-23 DIAGNOSIS — R0602 Shortness of breath: Secondary | ICD-10-CM | POA: Diagnosis not present

## 2016-06-23 DIAGNOSIS — E8779 Other fluid overload: Secondary | ICD-10-CM | POA: Diagnosis not present

## 2016-06-23 DIAGNOSIS — I132 Hypertensive heart and chronic kidney disease with heart failure and with stage 5 chronic kidney disease, or end stage renal disease: Secondary | ICD-10-CM | POA: Diagnosis not present

## 2016-06-23 DIAGNOSIS — R079 Chest pain, unspecified: Secondary | ICD-10-CM | POA: Diagnosis not present

## 2016-06-23 DIAGNOSIS — J811 Chronic pulmonary edema: Secondary | ICD-10-CM | POA: Diagnosis not present

## 2016-06-23 DIAGNOSIS — E1122 Type 2 diabetes mellitus with diabetic chronic kidney disease: Secondary | ICD-10-CM | POA: Diagnosis not present

## 2016-06-23 DIAGNOSIS — N186 End stage renal disease: Secondary | ICD-10-CM | POA: Diagnosis not present

## 2016-06-23 DIAGNOSIS — Z992 Dependence on renal dialysis: Secondary | ICD-10-CM | POA: Diagnosis not present

## 2016-06-23 DIAGNOSIS — I10 Essential (primary) hypertension: Secondary | ICD-10-CM | POA: Diagnosis not present

## 2016-06-24 DIAGNOSIS — N186 End stage renal disease: Secondary | ICD-10-CM | POA: Diagnosis not present

## 2016-06-24 DIAGNOSIS — R0789 Other chest pain: Secondary | ICD-10-CM | POA: Diagnosis not present

## 2016-06-24 DIAGNOSIS — Z955 Presence of coronary angioplasty implant and graft: Secondary | ICD-10-CM | POA: Diagnosis not present

## 2016-06-24 DIAGNOSIS — Z9115 Patient's noncompliance with renal dialysis: Secondary | ICD-10-CM | POA: Diagnosis not present

## 2016-06-24 DIAGNOSIS — I132 Hypertensive heart and chronic kidney disease with heart failure and with stage 5 chronic kidney disease, or end stage renal disease: Secondary | ICD-10-CM | POA: Diagnosis present

## 2016-06-24 DIAGNOSIS — I25119 Atherosclerotic heart disease of native coronary artery with unspecified angina pectoris: Secondary | ICD-10-CM | POA: Diagnosis not present

## 2016-06-24 DIAGNOSIS — Z992 Dependence on renal dialysis: Secondary | ICD-10-CM | POA: Diagnosis not present

## 2016-06-24 DIAGNOSIS — E875 Hyperkalemia: Secondary | ICD-10-CM | POA: Diagnosis not present

## 2016-06-24 DIAGNOSIS — I509 Heart failure, unspecified: Secondary | ICD-10-CM | POA: Diagnosis not present

## 2016-06-24 DIAGNOSIS — Z9981 Dependence on supplemental oxygen: Secondary | ICD-10-CM | POA: Diagnosis not present

## 2016-06-24 DIAGNOSIS — Z87891 Personal history of nicotine dependence: Secondary | ICD-10-CM | POA: Diagnosis not present

## 2016-06-24 DIAGNOSIS — E1122 Type 2 diabetes mellitus with diabetic chronic kidney disease: Secondary | ICD-10-CM | POA: Diagnosis present

## 2016-06-24 DIAGNOSIS — E8779 Other fluid overload: Secondary | ICD-10-CM | POA: Diagnosis present

## 2016-06-24 DIAGNOSIS — I2510 Atherosclerotic heart disease of native coronary artery without angina pectoris: Secondary | ICD-10-CM | POA: Diagnosis not present

## 2016-06-24 DIAGNOSIS — R079 Chest pain, unspecified: Secondary | ICD-10-CM | POA: Diagnosis not present

## 2016-06-28 DIAGNOSIS — D631 Anemia in chronic kidney disease: Secondary | ICD-10-CM | POA: Diagnosis not present

## 2016-06-28 DIAGNOSIS — N186 End stage renal disease: Secondary | ICD-10-CM | POA: Diagnosis not present

## 2016-06-29 DIAGNOSIS — I509 Heart failure, unspecified: Secondary | ICD-10-CM | POA: Diagnosis not present

## 2016-06-29 DIAGNOSIS — I2510 Atherosclerotic heart disease of native coronary artery without angina pectoris: Secondary | ICD-10-CM | POA: Diagnosis not present

## 2016-06-29 DIAGNOSIS — Z87891 Personal history of nicotine dependence: Secondary | ICD-10-CM | POA: Diagnosis not present

## 2016-06-29 DIAGNOSIS — Z7982 Long term (current) use of aspirin: Secondary | ICD-10-CM | POA: Diagnosis not present

## 2016-06-29 DIAGNOSIS — R0600 Dyspnea, unspecified: Secondary | ICD-10-CM | POA: Diagnosis not present

## 2016-06-29 DIAGNOSIS — N186 End stage renal disease: Secondary | ICD-10-CM | POA: Diagnosis not present

## 2016-06-29 DIAGNOSIS — R0602 Shortness of breath: Secondary | ICD-10-CM | POA: Diagnosis not present

## 2016-06-29 DIAGNOSIS — G8191 Hemiplegia, unspecified affecting right dominant side: Secondary | ICD-10-CM | POA: Diagnosis not present

## 2016-06-29 DIAGNOSIS — I132 Hypertensive heart and chronic kidney disease with heart failure and with stage 5 chronic kidney disease, or end stage renal disease: Secondary | ICD-10-CM | POA: Diagnosis not present

## 2016-06-29 DIAGNOSIS — E1122 Type 2 diabetes mellitus with diabetic chronic kidney disease: Secondary | ICD-10-CM | POA: Diagnosis not present

## 2016-06-29 DIAGNOSIS — I10 Essential (primary) hypertension: Secondary | ICD-10-CM | POA: Diagnosis not present

## 2016-06-29 DIAGNOSIS — Z5181 Encounter for therapeutic drug level monitoring: Secondary | ICD-10-CM | POA: Diagnosis not present

## 2016-06-29 DIAGNOSIS — R079 Chest pain, unspecified: Secondary | ICD-10-CM | POA: Diagnosis not present

## 2016-06-29 DIAGNOSIS — Z79899 Other long term (current) drug therapy: Secondary | ICD-10-CM | POA: Diagnosis not present

## 2016-06-29 DIAGNOSIS — R29898 Other symptoms and signs involving the musculoskeletal system: Secondary | ICD-10-CM | POA: Diagnosis not present

## 2016-06-29 DIAGNOSIS — Z992 Dependence on renal dialysis: Secondary | ICD-10-CM | POA: Diagnosis not present

## 2016-06-30 DIAGNOSIS — I10 Essential (primary) hypertension: Secondary | ICD-10-CM | POA: Diagnosis not present

## 2016-06-30 DIAGNOSIS — I2510 Atherosclerotic heart disease of native coronary artery without angina pectoris: Secondary | ICD-10-CM | POA: Diagnosis not present

## 2016-06-30 DIAGNOSIS — Z992 Dependence on renal dialysis: Secondary | ICD-10-CM | POA: Diagnosis not present

## 2016-06-30 DIAGNOSIS — R414 Neurologic neglect syndrome: Secondary | ICD-10-CM | POA: Diagnosis not present

## 2016-06-30 DIAGNOSIS — R202 Paresthesia of skin: Secondary | ICD-10-CM | POA: Diagnosis not present

## 2016-06-30 DIAGNOSIS — R2990 Unspecified symptoms and signs involving the nervous system: Secondary | ICD-10-CM | POA: Diagnosis not present

## 2016-06-30 DIAGNOSIS — N186 End stage renal disease: Secondary | ICD-10-CM | POA: Diagnosis not present

## 2016-06-30 DIAGNOSIS — E1122 Type 2 diabetes mellitus with diabetic chronic kidney disease: Secondary | ICD-10-CM | POA: Diagnosis not present

## 2016-06-30 DIAGNOSIS — R079 Chest pain, unspecified: Secondary | ICD-10-CM | POA: Diagnosis not present

## 2016-07-01 DIAGNOSIS — N186 End stage renal disease: Secondary | ICD-10-CM | POA: Diagnosis not present

## 2016-07-01 DIAGNOSIS — R29898 Other symptoms and signs involving the musculoskeletal system: Secondary | ICD-10-CM | POA: Diagnosis not present

## 2016-07-01 DIAGNOSIS — I10 Essential (primary) hypertension: Secondary | ICD-10-CM | POA: Diagnosis not present

## 2016-07-01 DIAGNOSIS — R2990 Unspecified symptoms and signs involving the nervous system: Secondary | ICD-10-CM | POA: Diagnosis not present

## 2016-07-01 DIAGNOSIS — R079 Chest pain, unspecified: Secondary | ICD-10-CM | POA: Diagnosis not present

## 2016-07-01 DIAGNOSIS — I2510 Atherosclerotic heart disease of native coronary artery without angina pectoris: Secondary | ICD-10-CM | POA: Diagnosis not present

## 2016-07-01 DIAGNOSIS — E1122 Type 2 diabetes mellitus with diabetic chronic kidney disease: Secondary | ICD-10-CM | POA: Diagnosis not present

## 2016-07-01 DIAGNOSIS — Z992 Dependence on renal dialysis: Secondary | ICD-10-CM | POA: Diagnosis not present

## 2016-07-03 DIAGNOSIS — N186 End stage renal disease: Secondary | ICD-10-CM | POA: Diagnosis not present

## 2016-07-03 DIAGNOSIS — D631 Anemia in chronic kidney disease: Secondary | ICD-10-CM | POA: Diagnosis not present

## 2016-07-05 DIAGNOSIS — D631 Anemia in chronic kidney disease: Secondary | ICD-10-CM | POA: Diagnosis not present

## 2016-07-05 DIAGNOSIS — N186 End stage renal disease: Secondary | ICD-10-CM | POA: Diagnosis not present

## 2016-07-07 DIAGNOSIS — E1122 Type 2 diabetes mellitus with diabetic chronic kidney disease: Secondary | ICD-10-CM | POA: Diagnosis not present

## 2016-07-07 DIAGNOSIS — Z992 Dependence on renal dialysis: Secondary | ICD-10-CM | POA: Diagnosis not present

## 2016-07-07 DIAGNOSIS — N186 End stage renal disease: Secondary | ICD-10-CM | POA: Diagnosis not present

## 2016-07-07 DIAGNOSIS — D631 Anemia in chronic kidney disease: Secondary | ICD-10-CM | POA: Diagnosis not present

## 2016-07-10 DIAGNOSIS — N186 End stage renal disease: Secondary | ICD-10-CM | POA: Diagnosis not present

## 2016-07-10 DIAGNOSIS — Z23 Encounter for immunization: Secondary | ICD-10-CM | POA: Diagnosis not present

## 2016-07-10 DIAGNOSIS — E119 Type 2 diabetes mellitus without complications: Secondary | ICD-10-CM | POA: Diagnosis not present

## 2016-07-12 DIAGNOSIS — Z23 Encounter for immunization: Secondary | ICD-10-CM | POA: Diagnosis not present

## 2016-07-12 DIAGNOSIS — E119 Type 2 diabetes mellitus without complications: Secondary | ICD-10-CM | POA: Diagnosis not present

## 2016-07-12 DIAGNOSIS — N186 End stage renal disease: Secondary | ICD-10-CM | POA: Diagnosis not present

## 2016-07-14 DIAGNOSIS — E119 Type 2 diabetes mellitus without complications: Secondary | ICD-10-CM | POA: Diagnosis not present

## 2016-07-14 DIAGNOSIS — Z23 Encounter for immunization: Secondary | ICD-10-CM | POA: Diagnosis not present

## 2016-07-14 DIAGNOSIS — N186 End stage renal disease: Secondary | ICD-10-CM | POA: Diagnosis not present

## 2016-07-17 DIAGNOSIS — Z23 Encounter for immunization: Secondary | ICD-10-CM | POA: Diagnosis not present

## 2016-07-17 DIAGNOSIS — E119 Type 2 diabetes mellitus without complications: Secondary | ICD-10-CM | POA: Diagnosis not present

## 2016-07-17 DIAGNOSIS — N186 End stage renal disease: Secondary | ICD-10-CM | POA: Diagnosis not present

## 2016-07-19 DIAGNOSIS — E119 Type 2 diabetes mellitus without complications: Secondary | ICD-10-CM | POA: Diagnosis not present

## 2016-07-19 DIAGNOSIS — N186 End stage renal disease: Secondary | ICD-10-CM | POA: Diagnosis not present

## 2016-07-19 DIAGNOSIS — Z23 Encounter for immunization: Secondary | ICD-10-CM | POA: Diagnosis not present

## 2016-07-21 DIAGNOSIS — E119 Type 2 diabetes mellitus without complications: Secondary | ICD-10-CM | POA: Diagnosis not present

## 2016-07-21 DIAGNOSIS — N186 End stage renal disease: Secondary | ICD-10-CM | POA: Diagnosis not present

## 2016-07-21 DIAGNOSIS — Z23 Encounter for immunization: Secondary | ICD-10-CM | POA: Diagnosis not present

## 2016-07-25 DIAGNOSIS — N186 End stage renal disease: Secondary | ICD-10-CM | POA: Diagnosis not present

## 2016-07-27 DIAGNOSIS — I272 Pulmonary hypertension, unspecified: Secondary | ICD-10-CM | POA: Diagnosis not present

## 2016-07-27 DIAGNOSIS — I5042 Chronic combined systolic (congestive) and diastolic (congestive) heart failure: Secondary | ICD-10-CM | POA: Diagnosis not present

## 2016-07-27 DIAGNOSIS — E1122 Type 2 diabetes mellitus with diabetic chronic kidney disease: Secondary | ICD-10-CM | POA: Diagnosis not present

## 2016-07-27 DIAGNOSIS — I160 Hypertensive urgency: Secondary | ICD-10-CM | POA: Diagnosis not present

## 2016-07-27 DIAGNOSIS — N185 Chronic kidney disease, stage 5: Secondary | ICD-10-CM | POA: Diagnosis not present

## 2016-07-27 DIAGNOSIS — R079 Chest pain, unspecified: Secondary | ICD-10-CM | POA: Diagnosis not present

## 2016-07-27 DIAGNOSIS — I132 Hypertensive heart and chronic kidney disease with heart failure and with stage 5 chronic kidney disease, or end stage renal disease: Secondary | ICD-10-CM | POA: Diagnosis not present

## 2016-07-27 DIAGNOSIS — R0602 Shortness of breath: Secondary | ICD-10-CM | POA: Diagnosis not present

## 2016-07-27 DIAGNOSIS — R0600 Dyspnea, unspecified: Secondary | ICD-10-CM | POA: Diagnosis not present

## 2016-07-27 DIAGNOSIS — N186 End stage renal disease: Secondary | ICD-10-CM | POA: Diagnosis not present

## 2016-07-27 DIAGNOSIS — I120 Hypertensive chronic kidney disease with stage 5 chronic kidney disease or end stage renal disease: Secondary | ICD-10-CM | POA: Diagnosis not present

## 2016-07-28 DIAGNOSIS — N250 Renal osteodystrophy: Secondary | ICD-10-CM | POA: Diagnosis not present

## 2016-07-28 DIAGNOSIS — N185 Chronic kidney disease, stage 5: Secondary | ICD-10-CM | POA: Diagnosis not present

## 2016-07-28 DIAGNOSIS — I5042 Chronic combined systolic (congestive) and diastolic (congestive) heart failure: Secondary | ICD-10-CM | POA: Diagnosis present

## 2016-07-28 DIAGNOSIS — I160 Hypertensive urgency: Secondary | ICD-10-CM | POA: Diagnosis present

## 2016-07-28 DIAGNOSIS — I132 Hypertensive heart and chronic kidney disease with heart failure and with stage 5 chronic kidney disease, or end stage renal disease: Secondary | ICD-10-CM | POA: Diagnosis present

## 2016-07-28 DIAGNOSIS — F329 Major depressive disorder, single episode, unspecified: Secondary | ICD-10-CM | POA: Diagnosis present

## 2016-07-28 DIAGNOSIS — Z9115 Patient's noncompliance with renal dialysis: Secondary | ICD-10-CM | POA: Diagnosis not present

## 2016-07-28 DIAGNOSIS — I2510 Atherosclerotic heart disease of native coronary artery without angina pectoris: Secondary | ICD-10-CM | POA: Diagnosis present

## 2016-07-28 DIAGNOSIS — E7800 Pure hypercholesterolemia, unspecified: Secondary | ICD-10-CM | POA: Diagnosis present

## 2016-07-28 DIAGNOSIS — I272 Pulmonary hypertension, unspecified: Secondary | ICD-10-CM | POA: Insufficient documentation

## 2016-07-28 DIAGNOSIS — E875 Hyperkalemia: Secondary | ICD-10-CM | POA: Diagnosis present

## 2016-07-28 DIAGNOSIS — Z955 Presence of coronary angioplasty implant and graft: Secondary | ICD-10-CM | POA: Diagnosis not present

## 2016-07-28 DIAGNOSIS — Z992 Dependence on renal dialysis: Secondary | ICD-10-CM | POA: Diagnosis not present

## 2016-07-28 DIAGNOSIS — E1169 Type 2 diabetes mellitus with other specified complication: Secondary | ICD-10-CM | POA: Diagnosis not present

## 2016-07-28 DIAGNOSIS — R079 Chest pain, unspecified: Secondary | ICD-10-CM | POA: Diagnosis not present

## 2016-07-28 DIAGNOSIS — I10 Essential (primary) hypertension: Secondary | ICD-10-CM | POA: Diagnosis not present

## 2016-07-28 DIAGNOSIS — J9690 Respiratory failure, unspecified, unspecified whether with hypoxia or hypercapnia: Secondary | ICD-10-CM | POA: Diagnosis not present

## 2016-07-28 DIAGNOSIS — E1122 Type 2 diabetes mellitus with diabetic chronic kidney disease: Secondary | ICD-10-CM | POA: Diagnosis present

## 2016-07-28 DIAGNOSIS — Z87891 Personal history of nicotine dependence: Secondary | ICD-10-CM | POA: Diagnosis not present

## 2016-07-28 DIAGNOSIS — E1139 Type 2 diabetes mellitus with other diabetic ophthalmic complication: Secondary | ICD-10-CM | POA: Diagnosis present

## 2016-07-28 DIAGNOSIS — E1142 Type 2 diabetes mellitus with diabetic polyneuropathy: Secondary | ICD-10-CM | POA: Diagnosis present

## 2016-07-28 DIAGNOSIS — K219 Gastro-esophageal reflux disease without esophagitis: Secondary | ICD-10-CM | POA: Diagnosis present

## 2016-07-28 DIAGNOSIS — N186 End stage renal disease: Secondary | ICD-10-CM | POA: Diagnosis present

## 2016-07-28 DIAGNOSIS — I517 Cardiomegaly: Secondary | ICD-10-CM | POA: Diagnosis not present

## 2016-07-28 DIAGNOSIS — H538 Other visual disturbances: Secondary | ICD-10-CM | POA: Diagnosis present

## 2016-07-28 DIAGNOSIS — I081 Rheumatic disorders of both mitral and tricuspid valves: Secondary | ICD-10-CM | POA: Diagnosis present

## 2016-07-28 DIAGNOSIS — Z7982 Long term (current) use of aspirin: Secondary | ICD-10-CM | POA: Diagnosis not present

## 2016-07-31 DIAGNOSIS — N186 End stage renal disease: Secondary | ICD-10-CM | POA: Diagnosis not present

## 2016-07-31 DIAGNOSIS — Z23 Encounter for immunization: Secondary | ICD-10-CM | POA: Diagnosis not present

## 2016-07-31 DIAGNOSIS — E119 Type 2 diabetes mellitus without complications: Secondary | ICD-10-CM | POA: Diagnosis not present

## 2016-08-02 DIAGNOSIS — E119 Type 2 diabetes mellitus without complications: Secondary | ICD-10-CM | POA: Diagnosis not present

## 2016-08-02 DIAGNOSIS — Z23 Encounter for immunization: Secondary | ICD-10-CM | POA: Diagnosis not present

## 2016-08-02 DIAGNOSIS — N186 End stage renal disease: Secondary | ICD-10-CM | POA: Diagnosis not present

## 2016-08-04 DIAGNOSIS — E119 Type 2 diabetes mellitus without complications: Secondary | ICD-10-CM | POA: Diagnosis not present

## 2016-08-04 DIAGNOSIS — N186 End stage renal disease: Secondary | ICD-10-CM | POA: Diagnosis not present

## 2016-08-04 DIAGNOSIS — Z23 Encounter for immunization: Secondary | ICD-10-CM | POA: Diagnosis not present

## 2016-08-07 DIAGNOSIS — N186 End stage renal disease: Secondary | ICD-10-CM | POA: Diagnosis not present

## 2016-08-07 DIAGNOSIS — Z23 Encounter for immunization: Secondary | ICD-10-CM | POA: Diagnosis not present

## 2016-08-07 DIAGNOSIS — E119 Type 2 diabetes mellitus without complications: Secondary | ICD-10-CM | POA: Diagnosis not present

## 2016-08-07 DIAGNOSIS — Z992 Dependence on renal dialysis: Secondary | ICD-10-CM | POA: Diagnosis not present

## 2016-08-07 DIAGNOSIS — E1122 Type 2 diabetes mellitus with diabetic chronic kidney disease: Secondary | ICD-10-CM | POA: Diagnosis not present

## 2016-08-09 DIAGNOSIS — E119 Type 2 diabetes mellitus without complications: Secondary | ICD-10-CM | POA: Diagnosis not present

## 2016-08-09 DIAGNOSIS — D631 Anemia in chronic kidney disease: Secondary | ICD-10-CM | POA: Diagnosis not present

## 2016-08-09 DIAGNOSIS — N186 End stage renal disease: Secondary | ICD-10-CM | POA: Diagnosis not present

## 2016-08-11 DIAGNOSIS — E119 Type 2 diabetes mellitus without complications: Secondary | ICD-10-CM | POA: Diagnosis not present

## 2016-08-11 DIAGNOSIS — D631 Anemia in chronic kidney disease: Secondary | ICD-10-CM | POA: Diagnosis not present

## 2016-08-11 DIAGNOSIS — N186 End stage renal disease: Secondary | ICD-10-CM | POA: Diagnosis not present

## 2016-08-14 DIAGNOSIS — N186 End stage renal disease: Secondary | ICD-10-CM | POA: Diagnosis not present

## 2016-08-14 DIAGNOSIS — D631 Anemia in chronic kidney disease: Secondary | ICD-10-CM | POA: Diagnosis not present

## 2016-08-14 DIAGNOSIS — E119 Type 2 diabetes mellitus without complications: Secondary | ICD-10-CM | POA: Diagnosis not present

## 2016-08-16 DIAGNOSIS — D631 Anemia in chronic kidney disease: Secondary | ICD-10-CM | POA: Diagnosis not present

## 2016-08-16 DIAGNOSIS — E119 Type 2 diabetes mellitus without complications: Secondary | ICD-10-CM | POA: Diagnosis not present

## 2016-08-16 DIAGNOSIS — N186 End stage renal disease: Secondary | ICD-10-CM | POA: Diagnosis not present

## 2016-08-18 DIAGNOSIS — N186 End stage renal disease: Secondary | ICD-10-CM | POA: Diagnosis not present

## 2016-08-18 DIAGNOSIS — D631 Anemia in chronic kidney disease: Secondary | ICD-10-CM | POA: Diagnosis not present

## 2016-08-18 DIAGNOSIS — E119 Type 2 diabetes mellitus without complications: Secondary | ICD-10-CM | POA: Diagnosis not present

## 2016-08-21 DIAGNOSIS — E119 Type 2 diabetes mellitus without complications: Secondary | ICD-10-CM | POA: Diagnosis not present

## 2016-08-21 DIAGNOSIS — N186 End stage renal disease: Secondary | ICD-10-CM | POA: Diagnosis not present

## 2016-08-21 DIAGNOSIS — D631 Anemia in chronic kidney disease: Secondary | ICD-10-CM | POA: Diagnosis not present

## 2016-08-23 DIAGNOSIS — D631 Anemia in chronic kidney disease: Secondary | ICD-10-CM | POA: Diagnosis not present

## 2016-08-23 DIAGNOSIS — E119 Type 2 diabetes mellitus without complications: Secondary | ICD-10-CM | POA: Diagnosis not present

## 2016-08-23 DIAGNOSIS — N186 End stage renal disease: Secondary | ICD-10-CM | POA: Diagnosis not present

## 2016-08-25 DIAGNOSIS — N186 End stage renal disease: Secondary | ICD-10-CM | POA: Diagnosis not present

## 2016-08-25 DIAGNOSIS — D631 Anemia in chronic kidney disease: Secondary | ICD-10-CM | POA: Diagnosis not present

## 2016-08-25 DIAGNOSIS — E1122 Type 2 diabetes mellitus with diabetic chronic kidney disease: Secondary | ICD-10-CM | POA: Diagnosis not present

## 2016-08-25 DIAGNOSIS — E119 Type 2 diabetes mellitus without complications: Secondary | ICD-10-CM | POA: Diagnosis not present

## 2016-08-25 DIAGNOSIS — Z992 Dependence on renal dialysis: Secondary | ICD-10-CM | POA: Diagnosis not present

## 2016-08-28 ENCOUNTER — Encounter (HOSPITAL_COMMUNITY): Payer: Self-pay | Admitting: Emergency Medicine

## 2016-08-28 ENCOUNTER — Observation Stay (HOSPITAL_COMMUNITY)
Admission: EM | Admit: 2016-08-28 | Discharge: 2016-08-29 | Disposition: A | Discharge status: Home / Self Care | Payer: Medicare Other | Attending: Oncology | Admitting: Oncology

## 2016-08-28 DIAGNOSIS — Z7982 Long term (current) use of aspirin: Secondary | ICD-10-CM | POA: Insufficient documentation

## 2016-08-28 DIAGNOSIS — Z87891 Personal history of nicotine dependence: Secondary | ICD-10-CM | POA: Insufficient documentation

## 2016-08-28 DIAGNOSIS — Z79899 Other long term (current) drug therapy: Secondary | ICD-10-CM | POA: Diagnosis not present

## 2016-08-28 DIAGNOSIS — N186 End stage renal disease: Secondary | ICD-10-CM | POA: Diagnosis not present

## 2016-08-28 DIAGNOSIS — Z992 Dependence on renal dialysis: Secondary | ICD-10-CM | POA: Diagnosis not present

## 2016-08-28 DIAGNOSIS — Z8673 Personal history of transient ischemic attack (TIA), and cerebral infarction without residual deficits: Secondary | ICD-10-CM | POA: Diagnosis not present

## 2016-08-28 DIAGNOSIS — I132 Hypertensive heart and chronic kidney disease with heart failure and with stage 5 chronic kidney disease, or end stage renal disease: Principal | ICD-10-CM | POA: Insufficient documentation

## 2016-08-28 DIAGNOSIS — E1140 Type 2 diabetes mellitus with diabetic neuropathy, unspecified: Secondary | ICD-10-CM | POA: Diagnosis not present

## 2016-08-28 DIAGNOSIS — I2510 Atherosclerotic heart disease of native coronary artery without angina pectoris: Secondary | ICD-10-CM | POA: Insufficient documentation

## 2016-08-28 DIAGNOSIS — R079 Chest pain, unspecified: Secondary | ICD-10-CM | POA: Diagnosis not present

## 2016-08-28 DIAGNOSIS — E1122 Type 2 diabetes mellitus with diabetic chronic kidney disease: Secondary | ICD-10-CM | POA: Diagnosis not present

## 2016-08-28 DIAGNOSIS — Z955 Presence of coronary angioplasty implant and graft: Secondary | ICD-10-CM | POA: Diagnosis not present

## 2016-08-28 DIAGNOSIS — E875 Hyperkalemia: Secondary | ICD-10-CM | POA: Diagnosis not present

## 2016-08-28 DIAGNOSIS — I5033 Acute on chronic diastolic (congestive) heart failure: Secondary | ICD-10-CM | POA: Insufficient documentation

## 2016-08-28 DIAGNOSIS — I120 Hypertensive chronic kidney disease with stage 5 chronic kidney disease or end stage renal disease: Secondary | ICD-10-CM | POA: Diagnosis not present

## 2016-08-28 HISTORY — DX: Gastro-esophageal reflux disease without esophagitis: K21.9

## 2016-08-28 LAB — COMPREHENSIVE METABOLIC PANEL
ALK PHOS: 66 U/L (ref 38–126)
ALT: 9 U/L — AB (ref 17–63)
ANION GAP: 18 — AB (ref 5–15)
AST: 9 U/L — ABNORMAL LOW (ref 15–41)
Albumin: 3.9 g/dL (ref 3.5–5.0)
BILIRUBIN TOTAL: 0.4 mg/dL (ref 0.3–1.2)
BUN: 71 mg/dL — ABNORMAL HIGH (ref 6–20)
CALCIUM: 7.5 mg/dL — AB (ref 8.9–10.3)
CO2: 22 mmol/L (ref 22–32)
CREATININE: 13.11 mg/dL — AB (ref 0.61–1.24)
Chloride: 98 mmol/L — ABNORMAL LOW (ref 101–111)
GFR, EST AFRICAN AMERICAN: 4 mL/min — AB (ref >60)
GFR, EST NON AFRICAN AMERICAN: 4 mL/min — AB (ref >60)
Glucose, Bld: 110 mg/dL — ABNORMAL HIGH (ref 65–99)
Potassium: 6.1 mmol/L — ABNORMAL HIGH (ref 3.5–5.1)
SODIUM: 138 mmol/L (ref 135–145)
TOTAL PROTEIN: 7.9 g/dL (ref 6.5–8.1)

## 2016-08-28 LAB — CBC
HCT: 34.8 % — ABNORMAL LOW (ref 39.0–52.0)
HEMOGLOBIN: 11.3 g/dL — AB (ref 13.0–17.0)
MCH: 26.7 pg (ref 26.0–34.0)
MCHC: 32.5 g/dL (ref 30.0–36.0)
MCV: 82.1 fL (ref 78.0–100.0)
PLATELETS: 170 10*3/uL (ref 150–400)
RBC: 4.24 MIL/uL (ref 4.22–5.81)
RDW: 18.3 % — ABNORMAL HIGH (ref 11.5–15.5)
WBC: 6.4 10*3/uL (ref 4.0–10.5)

## 2016-08-28 NOTE — ED Provider Notes (Signed)
[facility] DEPT Provider Note   CSN: [id number] Arrival date & time: 08/28/16  1833     History   Chief Complaint Chief Complaint  Patient presents with  . Vascular Access Problem     HPI  Blood pressure 164/91, pulse 81, temperature 98.6 F (37 C), temperature source Oral, resp. rate 11, SpO2 93 %.  [name] [name] is a 60 y.o. male complaining of need for dialysis, was last dialyzed x3 days ago, in [place]. States that he tried to establish care at the [facility].  but they didn't receive his paperwork and he stated like to follow [facility]  because of closer to his house. He is eating and drinking well with no fever, chills. Makes urine intermittently, no nausea or vomiting. On review of systems he endorses a left-sided nonradiating chest pain which she describes as pressure-like, 4 out of 10 not associated with nausea or diaphoresis but associate with mild shortness of breath onset at 6 PM, intermittent, no exacerbation with exertion.  He states that his pain is typical for when he is fluid overloaded And feels that he just needs dialysis.[name] He does have history of 2 stents. Chart review shows normal EF with moderate to severe mitral regurg. He had a stress test in January 2017 without acute abnormality. States he's moving to the area to be closer to his son. Denies increasing peripheral edema, orthopnea, PND, history of DVT/PE, recent mobilizations, calf pain.   Past Medical History:  Diagnosis Date  . Anemia   . Anginal pain ([place])   . CAD (coronary artery disease)    a. per [facility] s/p 3.17mm x 69mm Vision BMS to mid LAD 12/2009 and Xience DES to mid LAD 10/2010.  [name] Chronic diastolic CHF (congestive heart failure) ([place])   . Colon polyps   . Daily headache   . ESRD on dialysis [facility]) since ~ 2008   "[place]; TTS" (07/21/2015)  . Heart murmur   . Hematochezia    a. 2014: colonscopy, which showed moderately-sized internal hemorrhoids, two 61mm polyps in transverse colon  and ascending colon that were resected, five 2-23mm polyps in sigmoid colon, descending colon, transverse colon, and ascending colon that were resected. An upper endoscopy was performed and showed normal esophagus, stomach, and duodenum.  . Hematuria    a. H/o hematuria 2014 with cystoscopy that was unrevealing for his source of hematuria. He underwent a kidney ultrasound on 10/14 that showed mildly echogenic and scarred kidneys compatible with medical renal disease, without hydronephrosis or renal calculi.  [name] History of blood transfusion    "had colonoscopy done; they had to give me some blood"  . Hyperlipidemia   . Hypertension   . On home oxygen therapy    "2L prn" (07/21/2015)  . Renal insufficiency   . 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 Type II diabetes mellitus [facility])     Patient Active Problem List   Diagnosis Date Noted  . Nausea with vomiting 04/02/2016  . Chest pain 03/23/2016  . Pulmonary edema 03/18/2016  . ESRD (end stage renal disease) ([place]) 03/18/2016  . Fluid overload 03/18/2016  . End-stage renal disease on hemodialysis ([place])   . Hypertensive heart disease with heart failure ([place])   . Mitral regurgitation   . Hyperkalemia 03/10/2016  . Acute pulmonary edema ([facility])   . Dyspnea 02/19/2016  . Acute on chronic diastolic CHF (congestive heart failure), NYHA class 1 ([place]) 01/02/2016  .  ESRD (end stage renal disease) on dialysis ([place]) 01/01/2016  . Elevated troponin   . Emesis 12/23/2015  . Leukocytosis 12/23/2015  . Right sided weakness 10/28/2015  . Diabetic neuropathy ([place]) 07/29/2015  . Depression 07/22/2015  . GERD (gastroesophageal reflux disease) 06/04/2015  . Malnutrition of moderate degree ([place]) 05/17/2015  . Thrombocytopenia ([place]) 05/16/2015  . Weight loss 05/16/2015  . Angina of effort ([place]) 05/16/2015  . H/O TIA (transient ischemic attack) 04/01/2015  . Diastolic dysfunction-grade 2 12/13/2014  . Hypertension 04/27/2014  .  CAD -S/P LAD BMS 2011, LAD DES 2012- patent cors Feb 2016 04/27/2014  . Diabetes mellitus type 2, diet-controlled ([place]) 04/27/2014  . Chest pain, atypical 04/26/2014    Past Surgical History:  Procedure Laterality Date  . AV FISTULA PLACEMENT Left ~ 2007   "upper arm"  . CARDIAC CATHETERIZATION  "several"  . CORONARY ANGIOPLASTY WITH STENT PLACEMENT  "several"  . CYSTOSCOPY W/ STONE MANIPULATION  X2?  . EYE SURGERY Bilateral    "laser OR for hemorrhage"  . LEFT HEART CATHETERIZATION WITH CORONARY ANGIOGRAM N/A 11/23/2014   Procedure: LEFT HEART CATHETERIZATION WITH CORONARY ANGIOGRAM;  Surgeon: Troy Sine, MD;  Location: [facility] CATH LAB;  Service: Cardiovascular;  Laterality: N/A;  . LITHOTRIPSY  X1       Home Medications    Prior to Admission medications   Medication Sig Start Date End Date Taking? Authorizing Provider  acetaminophen (TYLENOL) 325 MG tablet Take 2 tablets (650 mg total) by mouth every 6 (six) hours as needed for mild pain (or Fever >/= 101). 03/27/16  Yes Kinnie Feil, MD  albuterol (PROVENTIL HFA;VENTOLIN HFA) 108 (90 Base) MCG/ACT inhaler Inhale 1-2 puffs into the lungs every 6 (six) hours as needed for wheezing or shortness of breath. 01/19/16  Yes Delsa Grana, PA-C  amLODipine (NORVASC) 10 MG tablet Take 1 tablet (10 mg total) by mouth daily. 10/30/15  Yes Ripudeep Krystal Eaton, MD  aspirin 81 MG chewable tablet Chew 1 tablet (81 mg total) by mouth daily. 07/22/15  Yes Iline Oven, MD  atorvastatin (LIPITOR) 20 MG tablet Take 20 mg by mouth at bedtime. Reported on 03/14/2016   Yes Historical Provider, MD  carvedilol (COREG) 25 MG tablet Take 1 tablet (25 mg total) by mouth 2 (two) times daily with a meal. 03/12/16  Yes Geradine Girt, DO  cinacalcet (SENSIPAR) 90 MG tablet Take 90 mg by mouth at bedtime.    Yes Historical Provider, MD  esomeprazole (NEXIUM) 20 MG capsule Take 1 capsule (20 mg total) by mouth daily. Patient taking differently: Take 20 mg by mouth  daily with supper.  04/02/16 04/02/17 Yes Maryellen Pile, MD  hydrALAZINE (APRESOLINE) 25 MG tablet Take 1 tablet (25 mg total) by mouth 3 (three) times daily. 03/18/16  Yes Reyne Dumas, MD  losartan (COZAAR) 100 MG tablet Take 100 mg by mouth at bedtime. 04/11/16  Yes Historical Provider, MD  multivitamin (RENA-VIT) TABS tablet Take 1 tablet by mouth at bedtime. 12/15/14  Yes Geradine Girt, DO  pregabalin (LYRICA) 50 MG capsule Take 1 capsule (50 mg total) by mouth daily. Patient taking differently: Take 50 mg by mouth at bedtime.  12/15/14  Yes Geradine Girt, DO  sevelamer carbonate (RENVELA) 800 MG tablet Take 4 tablets (3,200 mg total) by mouth 3 (three) times daily with meals. Patient taking differently: Take 1,600-3,200 mg by mouth See admin instructions. Take 4 tablets (3200 mg) by mouth with meals and 2 tablets (1600 mg)  with snacks 03/12/16  Yes Geradine Girt, DO  venlafaxine XR (EFFEXOR XR) 75 MG 24 hr capsule Take 1 capsule (75 mg total) by mouth daily with breakfast. 07/27/15  Yes Shela Leff, MD  amitriptyline (ELAVIL) 100 MG tablet Take 1 tablet (100 mg total) by mouth at bedtime. Patient not taking: Reported on 08/29/2016 07/27/15   Shela Leff, MD  OXYGEN Inhale 2 L into the lungs at bedtime.     Historical Provider, MD    Family History Family History  Problem Relation Age of Onset  . Hypertension    . Bone cancer Mother   . Anuerysm Father   . Diabetes type II Daughter     Social History Social History  Substance Use Topics  . Smoking status: Former Smoker    Packs/day: 0.50    Years: 8.00    Types: Cigarettes    Quit date: 12/06/2010  . Smokeless tobacco: Never Used  . Alcohol use No     Allergies   Enalapril   Review of Systems Review of Systems  10 systems reviewed and found to be negative, except as noted in the HPI.   Physical Exam Updated Vital Signs BP (!) 151/101   Pulse 94   Temp 98.6 F (37 C) (Oral)   Resp 13   SpO2 96%    Physical Exam  Constitutional: He is oriented to person, place, and time. He appears well-developed and well-nourished. No distress.  HENT:  Head: Normocephalic and atraumatic.  Mouth/Throat: Oropharynx is clear and moist.  Eyes: Conjunctivae and EOM are normal. Pupils are equal, round, and reactive to light.  Neck: Normal range of motion. No JVD present. No tracheal deviation present.  Cardiovascular: Normal rate, regular rhythm and intact distal pulses.   Fistula to left upper extremity with good thrill.  Pulmonary/Chest: Effort normal and breath sounds normal. No stridor. No respiratory distress. He has no wheezes. He has no rales. He exhibits no tenderness.  Abdominal: Soft. He exhibits no distension and no mass. There is no tenderness. There is no rebound and no guarding.  Musculoskeletal: Normal range of motion. He exhibits no edema or tenderness.  No calf asymmetry, superficial collaterals, palpable cords, edema, Homans sign negative bilaterally.    Neurological: He is alert and oriented to person, place, and time.  Skin: Skin is warm. He is not diaphoretic.  Psychiatric: He has a normal mood and affect.  Nursing note and vitals reviewed.    ED Treatments / Results  Labs (all labs ordered are listed, but only abnormal results are displayed) Labs Reviewed  COMPREHENSIVE METABOLIC PANEL - Abnormal; Notable for the following:       Result Value   Potassium 6.1 (*)    Chloride 98 (*)    Glucose, Bld 110 (*)    BUN 71 (*)    Creatinine, Ser 13.11 (*)    Calcium 7.5 (*)    AST 9 (*)    ALT 9 (*)    GFR calc non Af Amer 4 (*)    GFR calc Af Amer 4 (*)    Anion gap 18 (*)    All other components within normal limits  CBC - Abnormal; Notable for the following:    Hemoglobin 11.3 (*)    HCT 34.8 (*)    RDW 18.3 (*)    All other components within normal limits  Randolm Idol, ED  Randolm Idol, ED    EKG  EKG Interpretation  Date/Time:  Wednesday August 29 2016 00:00:14 EST Ventricular Rate:  84 PR Interval:    QRS Duration: 97 QT Interval:  397 QTC Calculation: 470 R Axis:   73 Text Interpretation:  Sinus rhythm prominent T waves Confirmed by Dina Rich  MD, COURTNEY ([id number]) on 08/29/2016 12:07:39 AM       Radiology Dg Chest 2 View  Result Date: 08/29/2016 CLINICAL DATA:  Acute onset of generalized chest pain and shortness of breath. Initial encounter. EXAM: CHEST  2 VIEW COMPARISON:  Chest radiograph performed 04/17/2016 FINDINGS: The lungs are well-aerated. Vascular congestion is noted. Increased interstitial markings raise concern for mild interstitial edema, though pneumonia could have a similar appearance. There is no evidence of pleural effusion or pneumothorax. The heart is borderline normal in size. No acute osseous abnormalities are seen. IMPRESSION: Vascular congestion noted. Increased interstitial markings raise concern for mild interstitial edema, though pneumonia could have a similar appearance. Electronically Signed   By: Garald Balding M.D.   On: 08/29/2016 00:35    Procedures Procedures (including critical care time)  Medications Ordered in ED Medications  nitroGLYCERIN (NITROSTAT) SL tablet 0.4 mg (0.4 mg Sublingual Given 08/29/16 0104)  aspirin chewable tablet 324 mg (324 mg Oral Given 08/29/16 0037)  sodium polystyrene (KAYEXALATE) 15 GM/60ML suspension 15 g (15 g Oral Given 08/29/16 0345)  albuterol (PROVENTIL) (2.5 MG/3ML) 0.083% nebulizer solution 10 mg (10 mg Nebulization Given 08/29/16 0508)     Initial Impression / Assessment and Plan / ED Course  I have reviewed the triage vital signs and the nursing notes.  Pertinent labs & imaging results that were available during my care of the patient were reviewed by me and considered in my medical decision making (see chart for details).  Clinical Course     Vitals:   08/29/16 0300 08/29/16 0302 08/29/16 0400 08/29/16 0430  BP: (!) 166/104  (!) 151/101   Pulse: 93  86  94  Resp: 17 19  13   Temp:      TempSrc:      SpO2:  96%  96%    Medications  nitroGLYCERIN (NITROSTAT) SL tablet 0.4 mg (0.4 mg Sublingual Given 08/29/16 0104)  aspirin chewable tablet 324 mg (324 mg Oral Given 08/29/16 0037)  sodium polystyrene (KAYEXALATE) 15 GM/60ML suspension 15 g (15 g Oral Given 08/29/16 0345)  albuterol (PROVENTIL) (2.5 MG/3ML) 0.083% nebulizer solution 10 mg (10 mg Nebulization Given 08/29/16 0508)    [name] [name] is 60 y.o. male presenting with Need for dialysis, and review of systems he also notes chest pain. Full dose aspirin given. Last dialyzed 3 days ago, states he is planning on moving to the area she can be closer to his son. Potassium 6.1, mild T-wave prominence. Patient given Kayexalate and albuterol.  Patient reassessed and chest pain has completely resolved. Will need expedited dialysis in the morning.  Case discussed with nephrologist Dr. Joelyn Oms who will dialyze the patient, states that this patient cannot obtain outpatient dialysis without admission. Discussed with internal medicine teaching service who except admission.   Final Clinical Impressions(s) / ED Diagnoses   Final diagnoses:  Hyperkalemia  ESRD needing dialysis [facility])    New Prescriptions New Prescriptions   No medications on file     Monico Blitz, PA-C 08/29/16 [place], PA-C 08/29/16 [place], MD 08/29/16 [phone]

## 2016-08-28 NOTE — ED Notes (Signed)
ED Provider at bedside. 

## 2016-08-28 NOTE — ED Triage Notes (Signed)
Pt here for routine dialysis; pt was being treated in [place] and last dialysis was Saturday

## 2016-08-29 ENCOUNTER — Emergency Department (HOSPITAL_COMMUNITY): Payer: Medicare Other

## 2016-08-29 ENCOUNTER — Encounter (HOSPITAL_COMMUNITY): Payer: Self-pay | Admitting: General Practice

## 2016-08-29 DIAGNOSIS — I120 Hypertensive chronic kidney disease with stage 5 chronic kidney disease or end stage renal disease: Secondary | ICD-10-CM | POA: Diagnosis not present

## 2016-08-29 DIAGNOSIS — Z9115 Patient's noncompliance with renal dialysis: Secondary | ICD-10-CM

## 2016-08-29 DIAGNOSIS — I132 Hypertensive heart and chronic kidney disease with heart failure and with stage 5 chronic kidney disease, or end stage renal disease: Secondary | ICD-10-CM

## 2016-08-29 DIAGNOSIS — I2510 Atherosclerotic heart disease of native coronary artery without angina pectoris: Secondary | ICD-10-CM

## 2016-08-29 DIAGNOSIS — Z992 Dependence on renal dialysis: Secondary | ICD-10-CM | POA: Diagnosis not present

## 2016-08-29 DIAGNOSIS — Z7982 Long term (current) use of aspirin: Secondary | ICD-10-CM

## 2016-08-29 DIAGNOSIS — R079 Chest pain, unspecified: Secondary | ICD-10-CM | POA: Diagnosis not present

## 2016-08-29 DIAGNOSIS — I5032 Chronic diastolic (congestive) heart failure: Secondary | ICD-10-CM

## 2016-08-29 DIAGNOSIS — Z720 Tobacco use: Secondary | ICD-10-CM

## 2016-08-29 DIAGNOSIS — D631 Anemia in chronic kidney disease: Secondary | ICD-10-CM | POA: Diagnosis not present

## 2016-08-29 DIAGNOSIS — J811 Chronic pulmonary edema: Secondary | ICD-10-CM | POA: Diagnosis not present

## 2016-08-29 DIAGNOSIS — N186 End stage renal disease: Secondary | ICD-10-CM | POA: Diagnosis not present

## 2016-08-29 DIAGNOSIS — Z888 Allergy status to other drugs, medicaments and biological substances status: Secondary | ICD-10-CM

## 2016-08-29 DIAGNOSIS — E875 Hyperkalemia: Secondary | ICD-10-CM

## 2016-08-29 DIAGNOSIS — E1122 Type 2 diabetes mellitus with diabetic chronic kidney disease: Secondary | ICD-10-CM

## 2016-08-29 DIAGNOSIS — Z79899 Other long term (current) drug therapy: Secondary | ICD-10-CM

## 2016-08-29 DIAGNOSIS — Z8249 Family history of ischemic heart disease and other diseases of the circulatory system: Secondary | ICD-10-CM

## 2016-08-29 LAB — MRSA PCR SCREENING: MRSA by PCR: NEGATIVE

## 2016-08-29 LAB — RENAL FUNCTION PANEL
ALBUMIN: 3.7 g/dL (ref 3.5–5.0)
Anion gap: 16 — ABNORMAL HIGH (ref 5–15)
BUN: 77 mg/dL — AB (ref 6–20)
CALCIUM: 8 mg/dL — AB (ref 8.9–10.3)
CO2: 24 mmol/L (ref 22–32)
Chloride: 99 mmol/L — ABNORMAL LOW (ref 101–111)
Creatinine, Ser: 13.84 mg/dL — ABNORMAL HIGH (ref 0.61–1.24)
GFR calc Af Amer: 4 mL/min — ABNORMAL LOW (ref >60)
GFR calc non Af Amer: 3 mL/min — ABNORMAL LOW (ref >60)
GLUCOSE: 123 mg/dL — AB (ref 65–99)
PHOSPHORUS: 9.6 mg/dL — AB (ref 2.5–4.6)
POTASSIUM: 6.8 mmol/L — AB (ref 3.5–5.1)
SODIUM: 139 mmol/L (ref 135–145)

## 2016-08-29 LAB — CBC
HEMATOCRIT: 36.1 % — AB (ref 39.0–52.0)
Hemoglobin: 11.6 g/dL — ABNORMAL LOW (ref 13.0–17.0)
MCH: 26.2 pg (ref 26.0–34.0)
MCHC: 32.1 g/dL (ref 30.0–36.0)
MCV: 81.7 fL (ref 78.0–100.0)
Platelets: 150 10*3/uL (ref 150–400)
RBC: 4.42 MIL/uL (ref 4.22–5.81)
RDW: 18.3 % — AB (ref 11.5–15.5)
WBC: 6.6 10*3/uL (ref 4.0–10.5)

## 2016-08-29 LAB — BASIC METABOLIC PANEL
ANION GAP: 16 — AB (ref 5–15)
BUN: 26 mg/dL — ABNORMAL HIGH (ref 6–20)
CALCIUM: 9.1 mg/dL (ref 8.9–10.3)
CHLORIDE: 94 mmol/L — AB (ref 101–111)
CO2: 27 mmol/L (ref 22–32)
Creatinine, Ser: 6.51 mg/dL — ABNORMAL HIGH (ref 0.61–1.24)
GFR calc Af Amer: 10 mL/min — ABNORMAL LOW (ref >60)
GFR calc non Af Amer: 8 mL/min — ABNORMAL LOW (ref >60)
GLUCOSE: 102 mg/dL — AB (ref 65–99)
Potassium: 4 mmol/L (ref 3.5–5.1)
Sodium: 137 mmol/L (ref 135–145)

## 2016-08-29 LAB — GLUCOSE, CAPILLARY
Glucose-Capillary: 90 mg/dL (ref 65–99)
Glucose-Capillary: 117 mg/dL — ABNORMAL HIGH (ref 65–99)

## 2016-08-29 LAB — TROPONIN I
TROPONIN I: 0.03 ng/mL — AB (ref <0.03)
Troponin I: <0.03 ng/mL (ref <0.03)

## 2016-08-29 LAB — I-STAT TROPONIN, ED
TROPONIN I, POC: 0.01 ng/mL (ref 0.00–0.08)
Troponin i, poc: 0.02 ng/mL (ref 0.00–0.08)

## 2016-08-29 LAB — HEPATITIS B SURFACE ANTIGEN: Hepatitis B Surface Ag: NEGATIVE

## 2016-08-29 MED ORDER — ASPIRIN 81 MG PO CHEW: 81.0000 mg | CHEWABLE_TABLET | Freq: Every day | ORAL | Status: DC

## 2016-08-29 MED ORDER — AMLODIPINE BESYLATE 10 MG PO TABS
10.0000 mg | ORAL_TABLET | Freq: Every day | ORAL | Status: DC
  Administered 2016-08-29: 10 mg via ORAL
  Filled 2016-08-29: qty 1

## 2016-08-29 MED ORDER — SODIUM POLYSTYRENE SULFONATE 15 GM/60ML PO SUSP
15.0000 g | Freq: Once | ORAL | Status: AC
  Administered 2016-08-29: 15 g via ORAL
  Filled 2016-08-29: qty 60

## 2016-08-29 MED ORDER — SEVELAMER CARBONATE 800 MG PO TABS
3200.0000 mg | ORAL_TABLET | Freq: Three times a day (TID) | ORAL | Status: DC
  Administered 2016-08-29 (×2): 3200 mg via ORAL
  Filled 2016-08-29: qty 4

## 2016-08-29 MED ORDER — RENA-VITE PO TABS: 1.0000 | ORAL_TABLET | Freq: Every day | ORAL | Status: DC

## 2016-08-29 MED ORDER — LOSARTAN POTASSIUM 50 MG PO TABS: 100.0000 mg | ORAL_TABLET | Freq: Every day | ORAL | Status: DC

## 2016-08-29 MED ORDER — NITROGLYCERIN 0.4 MG SL SUBL
0.4000 mg | SUBLINGUAL_TABLET | SUBLINGUAL | Status: DC | PRN
  Administered 2016-08-29 (×2): 0.4 mg via SUBLINGUAL

## 2016-08-29 MED ORDER — HYDRALAZINE HCL 25 MG PO TABS
25.0000 mg | ORAL_TABLET | Freq: Three times a day (TID) | ORAL | Status: DC
  Administered 2016-08-29: 25 mg via ORAL
  Filled 2016-08-29: qty 1

## 2016-08-29 MED ORDER — VENLAFAXINE HCL ER 75 MG PO CP24
75.0000 mg | ORAL_CAPSULE | Freq: Every day | ORAL | Status: DC
  Administered 2016-08-29: 75 mg via ORAL
  Filled 2016-08-29: qty 1

## 2016-08-29 MED ORDER — SODIUM CHLORIDE 0.9 % IV SOLN: 100.0000 mL | INTRAVENOUS | Status: DC | PRN

## 2016-08-29 MED ORDER — ALBUTEROL SULFATE (2.5 MG/3ML) 0.083% IN NEBU
10.0000 mg | INHALATION_SOLUTION | Freq: Once | RESPIRATORY_TRACT | Status: AC
  Administered 2016-08-29: 10 mg via RESPIRATORY_TRACT
  Filled 2016-08-29: qty 12

## 2016-08-29 MED ORDER — SODIUM CHLORIDE 0.9% FLUSH
3.0000 mL | Freq: Two times a day (BID) | INTRAVENOUS | Status: DC
  Administered 2016-08-29: 3 mL via INTRAVENOUS

## 2016-08-29 MED ORDER — PENTAFLUOROPROP-TETRAFLUOROETH EX AERO: 1.0000 "application " | INHALATION_SPRAY | CUTANEOUS | Status: DC | PRN

## 2016-08-29 MED ORDER — ATORVASTATIN CALCIUM 20 MG PO TABS: 20.0000 mg | ORAL_TABLET | Freq: Every day | ORAL | Status: DC

## 2016-08-29 MED ORDER — CINACALCET HCL 30 MG PO TABS: 90.0000 mg | ORAL_TABLET | Freq: Every day | ORAL | Status: DC

## 2016-08-29 MED ORDER — LIDOCAINE-PRILOCAINE 2.5-2.5 % EX CREA: 1.0000 "application " | TOPICAL_CREAM | CUTANEOUS | Status: DC | PRN

## 2016-08-29 MED ORDER — LIDOCAINE HCL (PF) 1 % IJ SOLN: 5.0000 mL | INTRAMUSCULAR | Status: DC | PRN

## 2016-08-29 MED ORDER — PREGABALIN 50 MG PO CAPS: 50.0000 mg | ORAL_CAPSULE | Freq: Every day | ORAL | Status: DC

## 2016-08-29 MED ORDER — ALBUTEROL SULFATE (2.5 MG/3ML) 0.083% IN NEBU: 2.5000 mg | INHALATION_SOLUTION | RESPIRATORY_TRACT | Status: DC | PRN

## 2016-08-29 MED ORDER — CARVEDILOL 25 MG PO TABS
25.0000 mg | ORAL_TABLET | Freq: Two times a day (BID) | ORAL | Status: DC
  Administered 2016-08-29 (×2): 25 mg via ORAL
  Filled 2016-08-29: qty 1
  Filled 2016-08-29 (×2): qty 2

## 2016-08-29 MED ORDER — ACETAMINOPHEN 325 MG PO TABS: 650.0000 mg | ORAL_TABLET | Freq: Four times a day (QID) | ORAL | Status: DC | PRN

## 2016-08-29 MED ORDER — HEPARIN SODIUM (PORCINE) 1000 UNIT/ML DIALYSIS: 20.0000 [IU]/kg | INTRAMUSCULAR | Status: DC | PRN

## 2016-08-29 MED ORDER — HEPARIN SODIUM (PORCINE) 1000 UNIT/ML DIALYSIS: 1000.0000 [IU] | INTRAMUSCULAR | Status: DC | PRN

## 2016-08-29 MED ORDER — PANTOPRAZOLE SODIUM 40 MG PO TBEC
40.0000 mg | DELAYED_RELEASE_TABLET | Freq: Every day | ORAL | Status: DC
  Administered 2016-08-29: 40 mg via ORAL
  Filled 2016-08-29: qty 1

## 2016-08-29 MED ORDER — SENNOSIDES-DOCUSATE SODIUM 8.6-50 MG PO TABS: 1.0000 | ORAL_TABLET | Freq: Every evening | ORAL | Status: DC | PRN

## 2016-08-29 MED ORDER — ACETAMINOPHEN 650 MG RE SUPP: 650.0000 mg | Freq: Four times a day (QID) | RECTAL | Status: DC | PRN

## 2016-08-29 MED ORDER — HEPARIN SODIUM (PORCINE) 5000 UNIT/ML IJ SOLN: 5000.0000 [IU] | Freq: Three times a day (TID) | INTRAMUSCULAR | Status: DC

## 2016-08-29 MED ORDER — ALTEPLASE 2 MG IJ SOLR: 2.0000 mg | Freq: Once | INTRAMUSCULAR | Status: DC | PRN

## 2016-08-29 MED ORDER — ASPIRIN 81 MG PO CHEW
324.0000 mg | CHEWABLE_TABLET | Freq: Once | ORAL | Status: AC
  Administered 2016-08-29: 324 mg via ORAL
  Filled 2016-08-29: qty 4

## 2016-08-29 NOTE — Care Management (Signed)
1549 08-29-16 Staff RN notified CM in regards to pt needing a bus pass. CM did reach out to CSW for [employer]. No further needs at this time. Bethena Roys, RN,BSN [phone]

## 2016-08-29 NOTE — Progress Notes (Signed)
Patient arrived to unit per bed.  Reviewed treatment plan and this RN agrees.  Report received from bedside RN, Ulice Dash.  Consent obtained.  Patient A & O X 4. Lung sounds diminished to ausculation in all fields. Generalized edema. Cardiac: ST.  Prepped LUAVF with alcohol and cannulated with two 15 gauge needles.  Pulsation of blood noted.  Flushed access well with saline per protocol.  Connected and secured lines and initiated tx at 1046.  UF goal of 4500 mL and net fluid removal of 4000 mL.  Will continue to monitor.

## 2016-08-29 NOTE — Care Management Obs Status (Signed)
[place] NOTIFICATION   Patient Details  Name: [name] [name] MRN: [id number] Date of Birth: Sep 13, 1956   Medicare Observation Status Notification Given:  Yes    Estela Vinal, Rory Percy, RN 08/29/2016, 2:28 PM

## 2016-08-29 NOTE — Progress Notes (Signed)
Critical lab value received: Potassium - 6.8.\  Nephrology notified.  Will continue to monitor.

## 2016-08-29 NOTE — ED Notes (Signed)
Nephrology at bedside

## 2016-08-29 NOTE — H&P (Signed)
Date: 08/29/2016               Patient Name:  [name] [name] MRN: [id number]  DOB: 1956/03/03 Age / Sex: 60 y.o., male   PCP: [name], MD         Medical Service: Internal Medicine Teaching Service         Attending Physician: Dr. Annia Belt, MD    First Contact: Dr. Jari Favre Pager: [phone]  Second Contact: Dr. Tiburcio Pea Pager: [phone]       After Hours (After 5p/  First Contact Pager: [phone]  weekends / holidays): Second Contact Pager: [phone]   Chief Complaint: CP  History of Present Illness: 60 year old man with ESRD on HD TThSa due to HTN and DM, CAD, chronic diastolic CHF, HLD presenting with chest pain and dyspnea. Chest pain is located in his left chest and does not radiate. Pressure in quality. No associated nausea or diaphoresis. He has associated dyspnea. Intermittent. Similar to chest pain and dyspnea he has when he has missed dialysis.   He last had dialysis on Saturday. He thinks he did the full treatment. This was over at his dialysis center in [place]. He moved back here to [place] after dialysis and now is back living with his son. He does not have transportation available to go back to [facility]. He was told at his dialysis session on Saturday that he would just need to come to [facility] and get blood work in order to get established with a local dialysis center.   No fevers/chills, vision changes, cough, nausea/vomiting, abdominal pain, constipation, diarrhea. He still makes a minimal urine and denies dysuria or hematuria. He denies rash, bleeding/bruising, edema. He has occasional shortness of breath and chest pain when he has missed dialysis.   In the ED, he was given full dose aspirin. He was given Kayexalate and albuterol for the hyperkalemia.   Meds:  Current Meds  Medication Sig  . acetaminophen (TYLENOL) 325 MG tablet Take 2 tablets (650 mg total) by mouth every 6 (six) hours as needed for mild pain (or Fever >/= 101).  [name] albuterol (PROVENTIL  HFA;VENTOLIN HFA) 108 (90 Base) MCG/ACT inhaler Inhale 1-2 puffs into the lungs every 6 (six) hours as needed for wheezing or shortness of breath.  [name] amLODipine (NORVASC) 10 MG tablet Take 1 tablet (10 mg total) by mouth daily.  [name] aspirin 81 MG chewable tablet Chew 1 tablet (81 mg total) by mouth daily.  [name] atorvastatin (LIPITOR) 20 MG tablet Take 20 mg by mouth at bedtime. Reported on 03/14/2016  . carvedilol (COREG) 25 MG tablet Take 1 tablet (25 mg total) by mouth 2 (two) times daily with a meal.  . cinacalcet (SENSIPAR) 90 MG tablet Take 90 mg by mouth at bedtime.   [name] esomeprazole (NEXIUM) 20 MG capsule Take 1 capsule (20 mg total) by mouth daily. (Patient taking differently: Take 20 mg by mouth daily with supper. )  . hydrALAZINE (APRESOLINE) 25 MG tablet Take 1 tablet (25 mg total) by mouth 3 (three) times daily.  [name] losartan (COZAAR) 100 MG tablet Take 100 mg by mouth at bedtime.  . multivitamin (RENA-VIT) TABS tablet Take 1 tablet by mouth at bedtime.  . pregabalin (LYRICA) 50 MG capsule Take 1 capsule (50 mg total) by mouth daily. (Patient taking differently: Take 50 mg by mouth at bedtime. )  . sevelamer carbonate (RENVELA) 800 MG tablet Take 4 tablets (3,200 mg total) by mouth 3 (three) times daily  with meals. (Patient taking differently: Take 1,600-3,200 mg by mouth See admin instructions. Take 4 tablets (3200 mg) by mouth with meals and 2 tablets (1600 mg) with snacks)  . venlafaxine XR (EFFEXOR XR) 75 MG 24 hr capsule Take 1 capsule (75 mg total) by mouth daily with breakfast.     Allergies: Allergies as of 08/28/2016 - Review Complete 08/28/2016  Allergen Reaction Noted  . Enalapril Hives 04/26/2014   Past Medical History:  Diagnosis Date  . Anemia   . Anginal pain ([place])   . CAD (coronary artery disease)    a. per CareEverywhere s/p 3.46mm x 24mm Vision BMS to mid LAD 12/2009 and Xience DES to mid LAD 10/2010.  [name] Chronic diastolic CHF (congestive heart failure) ([place])   . Colon  polyps   . Daily headache   . ESRD on dialysis [facility]) since ~ 2008   "[place]; TTS" (07/21/2015)  . Heart murmur   . Hematochezia    a. 2014: colonscopy, which showed moderately-sized internal hemorrhoids, two 49mm polyps in transverse colon and ascending colon that were resected, five 2-9mm polyps in sigmoid colon, descending colon, transverse colon, and ascending colon that were resected. An upper endoscopy was performed and showed normal esophagus, stomach, and duodenum.  . Hematuria    a. H/o hematuria 2014 with cystoscopy that was unrevealing for his source of hematuria. He underwent a kidney ultrasound on 10/14 that showed mildly echogenic and scarred kidneys compatible with medical renal disease, without hydronephrosis or renal calculi.  [name] History of blood transfusion    "had colonoscopy done; they had to give me some blood"  . Hyperlipidemia   . Hypertension   . On home oxygen therapy    "2L prn" (07/21/2015)  . Renal insufficiency   . 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 Type II diabetes mellitus ([place])     Family History:  No family history of renal disease. Father had heart disease.  Social History:  Occasional tobacco - a couple cigarettes a month. 10 year history with average of 1 ppd. Occasional marijuana. Occasional alcohol. He is on disability. He was previously a cook.  Review of Systems: A complete ROS was negative except as per HPI.   Physical Exam: Blood pressure (!) 169/109, pulse 105, temperature 98.6 F (37 C), temperature source Oral, resp. rate 22, SpO2 99 %. General Apperance: NAD Head: Normocephalic, atraumatic Eyes: PERRL, EOMI, anicteric sclera Ears: Normal external ear canal Nose: Nares normal, septum midline, mucosa normal Throat: Lips, mucosa and tongue normal  Neck: Supple, trachea midline Back: No tenderness or bony abnormality  Lungs: Few crackles bilateral lung fields. Breathing comfortably on [place] Chest Wall:  Nontender, no deformity Heart: Tachycardic but regular rhythm Abdomen: Soft, nontender, nondistended, no rebound/guarding Extremities: Normal, atraumatic, warm and well perfused, no edema Pulses: 2+ throughout Skin: No rashes or lesions Neurologic: Alert and oriented x 3. CNII-XII intact. Normal strength and sensation   EKG: Normal sinus rhythm, prominent T waves, no changes otherwise from previous EKG  CXR: Vascular congestion and increased interstitial markings.  Assessment & Plan by Problem: 60 year old man with ESRD on HD TThSa due to HTN and DM, CAD, chronic diastolic CHF, HLD presenting with chest pain and dyspnea.  Hyperkalemia, pulmonary edema due to ESRD noncompliance: Previously living and receiving HD in [place]. He moved here somewhat abruptly and would like to establish at a local dialysis center. He is unable to go back to [facility]  to complete the transfer process. He will be admitted to start the CLIP process. Potassium 6.1 on presentation and he received albuterol and Kayaxelate.  -HD per nephrology -Continue home cinacalcet, Rena-Vit, Renvela  Chest pain, CAD: Presently resolved. History consistent with his chest pain being secondary to missing his dialysis. He does have a history of CAD.  -Will trend troponins and recheck EKG in the morning. -Continue home ASA 81mg  daily -Continue home atorvastatin  HTN: Continue home amlodipine, carvedilol, hydralazine, losartan.  FEN: Renal diet VTE ppx: subq hep Code status: FULL  Dispo: Admit patient to Observation with expected length of stay less than 2 midnights.  Signed: Milagros Loll, MD 08/29/2016, 6:58 AM  Pager: [phone]

## 2016-08-29 NOTE — ED Notes (Signed)
Donah Driver, RN attempted report.

## 2016-08-29 NOTE — Consult Note (Signed)
Pax Reasoner Admit Date: 08/28/2016 08/29/2016 Rexene Agent Requesting Physician:  Horton MD  Reason for Consult:  Hyperkalemia, SOB, ESRD HPI:  60M ESRD in [place] last Tx on Saturday 11/18 in [place] who decided to move to [facility] after his Tx permanently.  He satates his home HD unit is trying to establish a local unit for him.  He came overnight into the ED 2/2 SOB, found to have mild hyperkalemia. Treated with kayexalate and albuterol.    Prev he rec dialysis at [facility].    PMH Incudes:  CAD  dCHF     Creatinine, Ser (mg/dL)  Date Value  08/28/2016 13.11 (H)  04/17/2016 7.04 (H)  04/13/2016 9.47 (H)  04/02/2016 9.99 (H)  03/27/2016 6.84 (H)  03/26/2016 8.53 (H)  03/24/2016 4.75 (H)  03/24/2016 8.65 (H)  03/23/2016 8.10 (H)  03/17/2016 10.11 (H)  ] I/Os:  ROS Balance of 12 systems is negative w/ exceptions as above  PMH  Past Medical History:  Diagnosis Date  . Anemia   . Anginal pain ([place])   . CAD (coronary artery disease)    a. per CareEverywhere s/p 3.93mm x 57mm Vision BMS to mid LAD 12/2009 and Xience DES to mid LAD 10/2010.  [name] Chronic diastolic CHF (congestive heart failure) ([place])   . Colon polyps   . Daily headache   . ESRD on dialysis [facility]) since ~ 2008   "[place]; TTS" (07/21/2015)  . Heart murmur   . Hematochezia    a. 2014: colonscopy, which showed moderately-sized internal hemorrhoids, two 68mm polyps in transverse colon and ascending colon that were resected, five 2-30mm polyps in sigmoid colon, descending colon, transverse colon, and ascending colon that were resected. An upper endoscopy was performed and showed normal esophagus, stomach, and duodenum.  . Hematuria    a. H/o hematuria 2014 with cystoscopy that was unrevealing for his source of hematuria. He underwent a kidney ultrasound on 10/14 that showed mildly echogenic and scarred kidneys compatible with medical renal disease, without hydronephrosis or renal calculi.  [name] History of  blood transfusion    "had colonoscopy done; they had to give me some blood"  . Hyperlipidemia   . Hypertension   . On home oxygen therapy    "2L prn" (07/21/2015)  . Renal insufficiency   . 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 Type II diabetes mellitus ([facility])    PSH  Past Surgical History:  Procedure Laterality Date  . AV FISTULA PLACEMENT Left ~ 2007   "upper arm"  . CARDIAC CATHETERIZATION  "several"  . CORONARY ANGIOPLASTY WITH STENT PLACEMENT  "several"  . CYSTOSCOPY W/ STONE MANIPULATION  X2?  . EYE SURGERY Bilateral    "laser OR for hemorrhage"  . LEFT HEART CATHETERIZATION WITH CORONARY ANGIOGRAM N/A 11/23/2014   Procedure: LEFT HEART CATHETERIZATION WITH CORONARY ANGIOGRAM;  Surgeon: Troy Sine, MD;  Location: [facility] CATH LAB;  Service: Cardiovascular;  Laterality: N/A;  . LITHOTRIPSY  X1   FH  Family History  Problem Relation Age of Onset  . Hypertension    . Bone cancer Mother   . Anuerysm Father   . Diabetes type II Daughter    SH  reports that he quit smoking about 5 years ago. His smoking use included Cigarettes. He has a 4.00 pack-year smoking history. He has never used smokeless tobacco. He reports that he does not drink alcohol or use drugs. Allergies  Allergies  Allergen Reactions  . Enalapril Hives   Home medications Prior to Admission medications   Medication Sig Start Date End Date Taking? Authorizing Provider  acetaminophen (TYLENOL) 325 MG tablet Take 2 tablets (650 mg total) by mouth every 6 (six) hours as needed for mild pain (or Fever >/= 101). 03/27/16  Yes Kinnie Feil, MD  albuterol (PROVENTIL HFA;VENTOLIN HFA) 108 (90 Base) MCG/ACT inhaler Inhale 1-2 puffs into the lungs every 6 (six) hours as needed for wheezing or shortness of breath. 01/19/16  Yes Delsa Grana, PA-C  amLODipine (NORVASC) 10 MG tablet Take 1 tablet (10 mg total) by mouth daily. 10/30/15  Yes Ripudeep Krystal Eaton, MD  aspirin 81 MG chewable tablet Chew 1  tablet (81 mg total) by mouth daily. 07/22/15  Yes Iline Oven, MD  atorvastatin (LIPITOR) 20 MG tablet Take 20 mg by mouth at bedtime. Reported on 03/14/2016   Yes Historical Provider, MD  carvedilol (COREG) 25 MG tablet Take 1 tablet (25 mg total) by mouth 2 (two) times daily with a meal. 03/12/16  Yes Geradine Girt, DO  cinacalcet (SENSIPAR) 90 MG tablet Take 90 mg by mouth at bedtime.    Yes Historical Provider, MD  esomeprazole (NEXIUM) 20 MG capsule Take 1 capsule (20 mg total) by mouth daily. Patient taking differently: Take 20 mg by mouth daily with supper.  04/02/16 04/02/17 Yes Maryellen Pile, MD  hydrALAZINE (APRESOLINE) 25 MG tablet Take 1 tablet (25 mg total) by mouth 3 (three) times daily. 03/18/16  Yes Reyne Dumas, MD  losartan (COZAAR) 100 MG tablet Take 100 mg by mouth at bedtime. 04/11/16  Yes Historical Provider, MD  multivitamin (RENA-VIT) TABS tablet Take 1 tablet by mouth at bedtime. 12/15/14  Yes Geradine Girt, DO  pregabalin (LYRICA) 50 MG capsule Take 1 capsule (50 mg total) by mouth daily. Patient taking differently: Take 50 mg by mouth at bedtime.  12/15/14  Yes Geradine Girt, DO  sevelamer carbonate (RENVELA) 800 MG tablet Take 4 tablets (3,200 mg total) by mouth 3 (three) times daily with meals. Patient taking differently: Take 1,600-3,200 mg by mouth See admin instructions. Take 4 tablets (3200 mg) by mouth with meals and 2 tablets (1600 mg) with snacks 03/12/16  Yes Geradine Girt, DO  venlafaxine XR (EFFEXOR XR) 75 MG 24 hr capsule Take 1 capsule (75 mg total) by mouth daily with breakfast. 07/27/15  Yes Shela Leff, MD  amitriptyline (ELAVIL) 100 MG tablet Take 1 tablet (100 mg total) by mouth at bedtime. Patient not taking: Reported on 08/29/2016 07/27/15   Shela Leff, MD  OXYGEN Inhale 2 L into the lungs at bedtime.     Historical Provider, MD    Current Medications Scheduled Meds: Continuous Infusions: PRN Meds:.nitroGLYCERIN  CBC  Recent  Labs Lab 08/28/16 1900  WBC 6.4  HGB 11.3*  HCT 34.8*  MCV 82.1  PLT 782   Basic Metabolic Panel  Recent Labs Lab 08/28/16 1900  NA 138  K 6.1*  CL 98*  CO2 22  GLUCOSE 110*  BUN 71*  CREATININE 13.11*  CALCIUM 7.5*    Physical Exam  Blood pressure (!) 169/109, pulse 105, temperature 98.6 F (37 C), temperature source Oral, resp. rate 22, SpO2 99 %. GEN: NAD, speaks in full sentences ENT: NCAT EYES: EOMI CV: RRR, no rub PULM: CTAB, nl wob ABD: s/nt/nd SKIN: no rashes/lesions EXT:no LEE LUE AVF tortuous, +B/T  CXR findings reviewed Labs reviewed EKG reviewed  Assessment 4M ESRD  with hyperkalemia (mild), pulm edema, and no local HD Unit 1. ESRD, noncompliant 2. Hyperkalemia 3. HTN 4. PUlm Edema 5. Anemia  Plan 1. HD this AM 2. If pt truly intends to return to area best recourse is for him to return to [facility] and arrange transfer as is typically done. He refuses, therefore he must be admitted and we will start th CLIP process.   Pearson Grippe MD [phone] pgr 08/29/2016, 6:19 AM

## 2016-08-29 NOTE — Progress Notes (Signed)
Patient Discharge: Disposition: Patient discharged to home. Education: Reviewed medication and discharge instructions, understood and acknowledged. IV: Discontinued IV before discharge. Telemetry: Discontinued before discharge, CCMD notified. Transportation: Patient escorted out of the unit but refused w/c, SW gave bus pass as he has no one to pick him up. Belongings: patient took all his belongings with him.

## 2016-08-29 NOTE — Progress Notes (Signed)
   Subjective:  Alerted by nursing that patient wanted to leave the hospital after returning to room from HD session.   Patient was admitted with dyspnea, chest discomfort, hyperkalemia and peaked T waves on EKG; last HD session was 4 days prior. He was taken to HD here and had 3.5 L of fluid taken off. Nephrology and nursing has been working on getting him into an HD clinic in [place] and he has been accepted at [facility] with start date of Sat Nov 25th.   Patient interviewed individually and with attending, Dr. Beryle Beams. He endorses feeling better and wanting to go home. He denies further shortness of breath and chest pain. He also denies abdominal pain, nausea, vomiting, headache, dizziness or weakness post HD session.    Objective:  Vital signs in last 24 hours: Vitals:   08/29/16 1345 08/29/16 1415 08/29/16 1446 08/29/16 1450  BP: (!) 146/77 125/72 (!) 146/74 (!) 170/90  Pulse: 73 71 65 69  Resp:   15   Temp:   97.6 F (36.4 C)   TempSrc:      SpO2:      Weight:   89.9 kg (198 lb 3.1 oz)    Constitutional: NAD, VS reviewed Eyes: no scleral icterus, EOMI CV: RRR, pulses intact, no LE edema Resp: CTAB, no increased work of breathing, no wheezing or crackles appreciated Abd: soft, +BS, NDNT MSK: able to move all 4 extremities freely Neuro: CN 2-12 grossly intact  Assessment/Plan:  Active Problems:   ESRD (end stage renal disease) on dialysis ([facility])  Hyperkalemia, pulmonary edema due to ESRD noncompliance:  Potassium 6.1 on presentation and he received albuterol and Kayaxelate. Patient had 3.5L of fluid taken off with HD session today. He states he feels better and is going to leave the hospital. Patient agreed to have blood taken for BMP to check his electrolytes but unwilling to stay for results. He provided [place] with a contact number if it was determined that he needed to return to the hospital. On exam, his lungs are clear to auscultation.  -Continue home cinacalcet, Rena-Vit,  Renvela -post-HD BMP - resolved hyperkalemia -patient to follow up with [facility] dialysis clinic on Friday to sign paper so he can initiate regular HD schedule on Saturday.   Chest pain, CAD:  Resolved. Patient with history of CAD so troponins were trended. They were negative x 3 -Continue home ASA 81mg  daily -Continue home atorvastatin  HTN: Continue home amlodipine, carvedilol, hydralazine, losartan.  Dispo: Patient discharged today.   Alphonzo Grieve, MD 08/29/2016, 8:42 PM Pager [phone]

## 2016-08-29 NOTE — Discharge Summary (Signed)
Name: [name] [name] MRN: [id number] DOB: 29-Sep-1956 60 y.o. PCP: [name], MD  Date of Admission: 08/28/2016 11:11 PM Date of Discharge: 08/29/2016 Attending Physician: Annia Belt, MD  Discharge Diagnosis: 1. Fluid overload and hyperkalemia 2/2 ESRD Active Problems:   ESRD (end stage renal disease) on dialysis [facility])   Discharge Medications:   Medication List    TAKE these medications   acetaminophen 325 MG tablet Commonly known as:  TYLENOL Take 2 tablets (650 mg total) by mouth every 6 (six) hours as needed for mild pain (or Fever >/= 101).   albuterol 108 (90 Base) MCG/ACT inhaler Commonly known as:  PROVENTIL HFA;VENTOLIN HFA Inhale 1-2 puffs into the lungs every 6 (six) hours as needed for wheezing or shortness of breath.   amitriptyline 100 MG tablet Commonly known as:  ELAVIL Take 1 tablet (100 mg total) by mouth at bedtime.   amLODipine 10 MG tablet Commonly known as:  NORVASC Take 1 tablet (10 mg total) by mouth daily.   aspirin 81 MG chewable tablet Chew 1 tablet (81 mg total) by mouth daily.   atorvastatin 20 MG tablet Commonly known as:  LIPITOR Take 20 mg by mouth at bedtime. Reported on 03/14/2016   carvedilol 25 MG tablet Commonly known as:  COREG Take 1 tablet (25 mg total) by mouth 2 (two) times daily with a meal.   cinacalcet 90 MG tablet Commonly known as:  SENSIPAR Take 90 mg by mouth at bedtime.   esomeprazole 20 MG capsule Commonly known as:  NEXIUM Take 1 capsule (20 mg total) by mouth daily. What changed:  when to take this   hydrALAZINE 25 MG tablet Commonly known as:  APRESOLINE Take 1 tablet (25 mg total) by mouth 3 (three) times daily.   losartan 100 MG tablet Commonly known as:  COZAAR Take 100 mg by mouth at bedtime.   multivitamin Tabs tablet Take 1 tablet by mouth at bedtime.   pregabalin 50 MG capsule Commonly known as:  LYRICA Take 1 capsule (50 mg total) by mouth daily. What changed:  when to take  this   sevelamer carbonate 800 MG tablet Commonly known as:  RENVELA Take 4 tablets (3,200 mg total) by mouth 3 (three) times daily with meals. What changed:  how much to take  when to take this  additional instructions   venlafaxine XR 75 MG 24 hr capsule Commonly known as:  EFFEXOR XR Take 1 capsule (75 mg total) by mouth daily with breakfast.       Disposition and follow-up:   Mr.Mattson Kroening was discharged from [facility] in Stable condition.  At the hospital follow up visit please address:  1.   --Is patient able to follow regular schedule for his dialysis sessions? --any recurrent chest pain and shortness of breath?  2.  Labs / imaging needed at time of follow-up: BMP to evaluate electrolytes, esp K  3.  Pending labs/ test needing follow-up: none  Follow-up Appointments:   Hospital Course by problem list: Active Problems:   ESRD (end stage renal disease) on dialysis ([place])   Fluid overload and hyperkalemia 2/2 ESRD: Patient with ESRD on HD on TThS presenting with chest discomfort and shortness of breath. His last session was 4 days prior and symptoms are consistent with prior presentations when he was due for dialysis, per patient. Patient was in [place] and just moved to [place] 2 days prior to presenation - he states that at his last HD session  he was told he would just need to come to [facility] to get established at a local dialysis center versus going through proper transfer of clinics. CXR was consistent with pulmonary edema. Labs were consistent with hyperkalemia, EKG showed prominent T waves, and troponins were negative x 3. Patient was given kayaxalate and albuterol. Patient was taken to dialysis and had 3.5L removed. Nephrology evaluated patient and managed his dialysis session. They also worked to get patient established at a local dialysis center as early as 11/24 as long as patient followed through. At reevaluation, patient had no more dyspnea or  shortness of breath. He expressed desire to leave hospital. He agreed to getting BMP repeated but would not wait for results - he provided [place] with contact info if he needed to return to the hospital. Repeat potassium was wnl.  HTN: Patient with history of hypertension managed with amlodipine, carvedilol, hydralazine and losartan. These were not administered during hospitalization due to short stay. They were continued on discharge.  Discharge Vitals:   BP (!) 170/90   Pulse 69   Temp 97.6 F (36.4 C)   Resp 15   Wt 89.9 kg (198 lb 3.1 oz)   SpO2 100%   BMI 26.88 kg/m   Pertinent Labs, Studies, and Procedures:  CMP Latest Ref Rng & Units 08/29/2016 08/29/2016 08/28/2016  Glucose 65 - 99 mg/dL 102(H) 123(H) 110(H)  BUN 6 - 20 mg/dL 26(H) 77(H) 71(H)  Creatinine 0.61 - 1.24 mg/dL 6.51(H) 13.84(H) 13.11(H)  Sodium 135 - 145 mmol/L 137 139 138  Potassium 3.5 - 5.1 mmol/L 4.0 6.8(HH) 6.1(H)  Chloride 101 - 111 mmol/L 94(L) 99(L) 98(L)  CO2 22 - 32 mmol/L 27 24 22   Calcium 8.9 - 10.3 mg/dL 9.1 8.0(L) 7.5(L)  Total Protein 6.5 - 8.1 g/dL - - 7.9  Total Bilirubin 0.3 - 1.2 mg/dL - - 0.4  Alkaline Phos 38 - 126 U/L - - 66  AST 15 - 41 U/L - - 9(L)  ALT 17 - 63 U/L - - 9(L)   CBC Latest Ref Rng & Units 08/29/2016 08/28/2016 04/17/2016  WBC 4.0 - 10.5 K/uL 6.6 6.4 6.0  Hemoglobin 13.0 - 17.0 g/dL 11.6(L) 11.3(L) 10.4(L)  Hematocrit 39.0 - 52.0 % 36.1(L) 34.8(L) 35.2(L)  Platelets 150 - 400 K/uL 150 170 135(L)   CXR 08/29/2016: Vascular congestion noted. Increased interstitial markings raise concern for mild interstitial edema, though pneumonia could have a similar appearance  Hep B sAg - negative Hep B cAb - negative Hep B sAb - positive Above are consistent with immunization  Discharge Instructions: Discharge Instructions    Diet - low sodium heart healthy    Complete by:  As directed    Diet - low sodium heart healthy    Complete by:  As directed    Discharge instructions     Complete by:  As directed    Please go to the dialysis center ([facility]) on Friday before 2pm to get all of your paperwork ready to have regular dialysis there.  We are checking your blood work and will call you if you need to come back to the hospital if your electrolytes are at a dangerous level. Please be available for contact.   Increase activity slowly    Complete by:  As directed    Increase activity slowly    Complete by:  As directed       Signed: Alphonzo Grieve, MD 08/29/2016, 4:58 PM   Pager [phone]

## 2016-08-29 NOTE — Progress Notes (Signed)
Dialysis treatment completed.  4000 mL ultrafiltrated and net fluid removal 3500 mL.    Patient status unchanged. Lung sounds clear to ausculation in all fields. Generalized edema. Cardiac: NSR.  Disconnected lines and removed needles.  Pressure held for 10 minutes and band aid/gauze dressing applied.  Report given to bedside RN, Ulice Dash.

## 2016-08-29 NOTE — Procedures (Signed)
Tolerating hemodialysis, Changed to 1K bath due to hyperkalemia. Goal at 4000cc. Stable hemodynamics [name] [name] C

## 2016-08-29 NOTE — ED Notes (Signed)
Pt noted to have oxygen saturations hovering around 92 to 93% on RA. Placed pt on 2L [place] and saturations improved to approx 95%. Will cont to monitor.

## 2016-08-29 NOTE — Progress Notes (Signed)
Admission note:  Arrival Method: Patient arrived from ED on stretcher accompanied by the staff. Mental Orientation: Alert and oriented x 4. Telemetry: 6E-09, NSR 78 Assessment: See doc flow sheets. Skin: warm dry and intact.  No open areas noted. IV: Right AC  Saline lock Pain: Denies any pain currently. Tubes: N/A Safety Measures: Bed in low position, call bell and phone within reach. Fall Prevention Safety Plan: Reviewed the plan, understood and acknowledged but refused to be on bed alarm, he had a fall couple of weeks back and high fall risk. Fall risk band on the patient and yellow socks. Admission Screening: In progress. 6700 Orientation: Patient has been oriented to the unit, staff and to the room.

## 2016-08-29 NOTE — Progress Notes (Signed)
Patient to start outpatient dialysis at [facility] on [facility] on Saturday 11/25 at 1 pm.  He NEEDS to go by there on Friday 11/24 before 2 pm to sign papers prior to starting on Saturday.  Amalia Hailey, PA-C

## 2016-08-30 LAB — HEPATITIS B SURFACE ANTIBODY,QUALITATIVE: HEP B S AB: REACTIVE

## 2016-08-30 LAB — HEPATITIS B CORE ANTIBODY, TOTAL: Hep B Core Total Ab: NEGATIVE

## 2016-09-01 DIAGNOSIS — N186 End stage renal disease: Secondary | ICD-10-CM | POA: Diagnosis not present

## 2016-09-01 DIAGNOSIS — D509 Iron deficiency anemia, unspecified: Secondary | ICD-10-CM | POA: Diagnosis not present

## 2016-09-01 DIAGNOSIS — E1122 Type 2 diabetes mellitus with diabetic chronic kidney disease: Secondary | ICD-10-CM | POA: Diagnosis not present

## 2016-09-01 DIAGNOSIS — E1129 Type 2 diabetes mellitus with other diabetic kidney complication: Secondary | ICD-10-CM | POA: Diagnosis not present

## 2016-09-01 DIAGNOSIS — Z992 Dependence on renal dialysis: Secondary | ICD-10-CM | POA: Diagnosis not present

## 2016-09-02 NOTE — Discharge Summary (Signed)
Medicine Attending Discharge Note: This patient was discharged on the same day as admission. See attending H&P for details of his medical history.  60 year old man with end stage renal disease on dialysis who missed a scheduled dialysis treatment earlier in the week. He developed chest discomfort which he stated was typical of symptoms he gets when he misses treatments. EKG showed no acute ischemic change or arrhythmias but there were peaked p waves and potassium returned at 6.8. No elevation of cardiac enzymes.  He was dialyzed. He was asymptomatic post dialysis and wanted to go home. A stat K was now normal at 4.0. He left before result available but gave [place] his contact number in case he needed to have additional dialysis the next day. Arrangements otherwise in place for him to resume his regular schedule.  Disposition: Condition stable at time of discharge There were no complications He will have dialysis locally on Friday then return to [place] where he lives after Thanksgiving.

## 2016-09-04 DIAGNOSIS — D509 Iron deficiency anemia, unspecified: Secondary | ICD-10-CM | POA: Diagnosis not present

## 2016-09-04 DIAGNOSIS — N186 End stage renal disease: Secondary | ICD-10-CM | POA: Diagnosis not present

## 2016-09-04 DIAGNOSIS — E1129 Type 2 diabetes mellitus with other diabetic kidney complication: Secondary | ICD-10-CM | POA: Diagnosis not present

## 2016-09-06 DIAGNOSIS — N186 End stage renal disease: Secondary | ICD-10-CM | POA: Diagnosis not present

## 2016-09-06 DIAGNOSIS — D509 Iron deficiency anemia, unspecified: Secondary | ICD-10-CM | POA: Diagnosis not present

## 2016-09-06 DIAGNOSIS — E1129 Type 2 diabetes mellitus with other diabetic kidney complication: Secondary | ICD-10-CM | POA: Diagnosis not present

## 2016-09-08 DIAGNOSIS — N186 End stage renal disease: Secondary | ICD-10-CM | POA: Diagnosis not present

## 2016-09-08 DIAGNOSIS — E1129 Type 2 diabetes mellitus with other diabetic kidney complication: Secondary | ICD-10-CM | POA: Diagnosis not present

## 2016-09-08 DIAGNOSIS — N2581 Secondary hyperparathyroidism of renal origin: Secondary | ICD-10-CM | POA: Diagnosis not present

## 2016-09-08 DIAGNOSIS — D509 Iron deficiency anemia, unspecified: Secondary | ICD-10-CM | POA: Diagnosis not present

## 2016-09-11 DIAGNOSIS — N186 End stage renal disease: Secondary | ICD-10-CM | POA: Diagnosis not present

## 2016-09-11 DIAGNOSIS — E1129 Type 2 diabetes mellitus with other diabetic kidney complication: Secondary | ICD-10-CM | POA: Diagnosis not present

## 2016-09-11 DIAGNOSIS — D509 Iron deficiency anemia, unspecified: Secondary | ICD-10-CM | POA: Diagnosis not present

## 2016-09-11 DIAGNOSIS — N2581 Secondary hyperparathyroidism of renal origin: Secondary | ICD-10-CM | POA: Diagnosis not present

## 2016-09-13 DIAGNOSIS — E1129 Type 2 diabetes mellitus with other diabetic kidney complication: Secondary | ICD-10-CM | POA: Diagnosis not present

## 2016-09-13 DIAGNOSIS — D509 Iron deficiency anemia, unspecified: Secondary | ICD-10-CM | POA: Diagnosis not present

## 2016-09-13 DIAGNOSIS — N2581 Secondary hyperparathyroidism of renal origin: Secondary | ICD-10-CM | POA: Diagnosis not present

## 2016-09-13 DIAGNOSIS — N186 End stage renal disease: Secondary | ICD-10-CM | POA: Diagnosis not present

## 2016-09-15 DIAGNOSIS — D509 Iron deficiency anemia, unspecified: Secondary | ICD-10-CM | POA: Diagnosis not present

## 2016-09-15 DIAGNOSIS — N186 End stage renal disease: Secondary | ICD-10-CM | POA: Diagnosis not present

## 2016-09-15 DIAGNOSIS — E1129 Type 2 diabetes mellitus with other diabetic kidney complication: Secondary | ICD-10-CM | POA: Diagnosis not present

## 2016-09-15 DIAGNOSIS — N2581 Secondary hyperparathyroidism of renal origin: Secondary | ICD-10-CM | POA: Diagnosis not present

## 2016-09-18 DIAGNOSIS — N2581 Secondary hyperparathyroidism of renal origin: Secondary | ICD-10-CM | POA: Diagnosis not present

## 2016-09-18 DIAGNOSIS — N186 End stage renal disease: Secondary | ICD-10-CM | POA: Diagnosis not present

## 2016-09-18 DIAGNOSIS — D509 Iron deficiency anemia, unspecified: Secondary | ICD-10-CM | POA: Diagnosis not present

## 2016-09-18 DIAGNOSIS — E1129 Type 2 diabetes mellitus with other diabetic kidney complication: Secondary | ICD-10-CM | POA: Diagnosis not present

## 2016-09-20 DIAGNOSIS — D509 Iron deficiency anemia, unspecified: Secondary | ICD-10-CM | POA: Diagnosis not present

## 2016-09-20 DIAGNOSIS — E1129 Type 2 diabetes mellitus with other diabetic kidney complication: Secondary | ICD-10-CM | POA: Diagnosis not present

## 2016-09-20 DIAGNOSIS — N186 End stage renal disease: Secondary | ICD-10-CM | POA: Diagnosis not present

## 2016-09-20 DIAGNOSIS — N2581 Secondary hyperparathyroidism of renal origin: Secondary | ICD-10-CM | POA: Diagnosis not present

## 2016-09-22 DIAGNOSIS — N2581 Secondary hyperparathyroidism of renal origin: Secondary | ICD-10-CM | POA: Diagnosis not present

## 2016-09-22 DIAGNOSIS — D509 Iron deficiency anemia, unspecified: Secondary | ICD-10-CM | POA: Diagnosis not present

## 2016-09-22 DIAGNOSIS — E1129 Type 2 diabetes mellitus with other diabetic kidney complication: Secondary | ICD-10-CM | POA: Diagnosis not present

## 2016-09-22 DIAGNOSIS — N186 End stage renal disease: Secondary | ICD-10-CM | POA: Diagnosis not present

## 2016-09-23 ENCOUNTER — Emergency Department (HOSPITAL_COMMUNITY): Payer: Medicare Other

## 2016-09-23 ENCOUNTER — Encounter (HOSPITAL_COMMUNITY): Payer: Self-pay | Admitting: *Deleted

## 2016-09-23 ENCOUNTER — Emergency Department (HOSPITAL_COMMUNITY)
Admission: EM | Admit: 2016-09-23 | Discharge: 2016-09-23 | Disposition: A | Discharge status: Home / Self Care | Payer: Medicare Other | Attending: Emergency Medicine | Admitting: Emergency Medicine

## 2016-09-23 DIAGNOSIS — R0789 Other chest pain: Secondary | ICD-10-CM | POA: Diagnosis not present

## 2016-09-23 DIAGNOSIS — Z955 Presence of coronary angioplasty implant and graft: Secondary | ICD-10-CM | POA: Insufficient documentation

## 2016-09-23 DIAGNOSIS — I5033 Acute on chronic diastolic (congestive) heart failure: Secondary | ICD-10-CM | POA: Diagnosis not present

## 2016-09-23 DIAGNOSIS — Z91040 Latex allergy status: Secondary | ICD-10-CM | POA: Insufficient documentation

## 2016-09-23 DIAGNOSIS — E1122 Type 2 diabetes mellitus with diabetic chronic kidney disease: Secondary | ICD-10-CM | POA: Diagnosis not present

## 2016-09-23 DIAGNOSIS — E1140 Type 2 diabetes mellitus with diabetic neuropathy, unspecified: Secondary | ICD-10-CM | POA: Diagnosis not present

## 2016-09-23 DIAGNOSIS — R079 Chest pain, unspecified: Secondary | ICD-10-CM | POA: Diagnosis not present

## 2016-09-23 DIAGNOSIS — I132 Hypertensive heart and chronic kidney disease with heart failure and with stage 5 chronic kidney disease, or end stage renal disease: Secondary | ICD-10-CM | POA: Insufficient documentation

## 2016-09-23 DIAGNOSIS — N186 End stage renal disease: Secondary | ICD-10-CM | POA: Diagnosis not present

## 2016-09-23 DIAGNOSIS — Z7982 Long term (current) use of aspirin: Secondary | ICD-10-CM | POA: Diagnosis not present

## 2016-09-23 DIAGNOSIS — Z992 Dependence on renal dialysis: Secondary | ICD-10-CM | POA: Diagnosis not present

## 2016-09-23 DIAGNOSIS — I2510 Atherosclerotic heart disease of native coronary artery without angina pectoris: Secondary | ICD-10-CM | POA: Insufficient documentation

## 2016-09-23 DIAGNOSIS — Z87891 Personal history of nicotine dependence: Secondary | ICD-10-CM | POA: Insufficient documentation

## 2016-09-23 DIAGNOSIS — R0602 Shortness of breath: Secondary | ICD-10-CM | POA: Diagnosis not present

## 2016-09-23 LAB — BASIC METABOLIC PANEL
ANION GAP: 14 (ref 5–15)
BUN: 30 mg/dL — ABNORMAL HIGH (ref 6–20)
CO2: 27 mmol/L (ref 22–32)
Calcium: 9.5 mg/dL (ref 8.9–10.3)
Chloride: 95 mmol/L — ABNORMAL LOW (ref 101–111)
Creatinine, Ser: 8.48 mg/dL — ABNORMAL HIGH (ref 0.61–1.24)
GFR calc Af Amer: 7 mL/min — ABNORMAL LOW (ref >60)
GFR, EST NON AFRICAN AMERICAN: 6 mL/min — AB (ref >60)
Glucose, Bld: 94 mg/dL (ref 65–99)
POTASSIUM: 4.4 mmol/L (ref 3.5–5.1)
SODIUM: 136 mmol/L (ref 135–145)

## 2016-09-23 LAB — CBC
HEMATOCRIT: 41.7 % (ref 39.0–52.0)
HEMOGLOBIN: 13.7 g/dL (ref 13.0–17.0)
MCH: 27.3 pg (ref 26.0–34.0)
MCHC: 32.9 g/dL (ref 30.0–36.0)
MCV: 83.1 fL (ref 78.0–100.0)
Platelets: 208 10*3/uL (ref 150–400)
RBC: 5.02 MIL/uL (ref 4.22–5.81)
RDW: 17.4 % — ABNORMAL HIGH (ref 11.5–15.5)
WBC: 5.8 10*3/uL (ref 4.0–10.5)

## 2016-09-23 LAB — TROPONIN I: Troponin I: <0.03 ng/mL (ref <0.03)

## 2016-09-23 MED ORDER — HYDROMORPHONE HCL 2 MG/ML IJ SOLN
1.0000 mg | Freq: Once | INTRAMUSCULAR | Status: AC
  Administered 2016-09-23: 1 mg via INTRAVENOUS
  Filled 2016-09-23: qty 1

## 2016-09-23 MED ORDER — HYDROCODONE-ACETAMINOPHEN 5-325 MG PO TABS: 2.0000 | ORAL_TABLET | ORAL | 0 refills | Status: DC | PRN

## 2016-09-23 MED ORDER — ONDANSETRON HCL 4 MG/2ML IJ SOLN
4.0000 mg | Freq: Once | INTRAMUSCULAR | Status: AC
  Administered 2016-09-23: 4 mg via INTRAVENOUS
  Filled 2016-09-23: qty 2

## 2016-09-23 MED ORDER — IOPAMIDOL (ISOVUE-370) INJECTION 76%
INTRAVENOUS | Status: AC
  Administered 2016-09-23: 100 mL
  Filled 2016-09-23: qty 100

## 2016-09-23 NOTE — ED Provider Notes (Signed)
[facility] DEPT Provider Note   CSN: [id number] Arrival date & time: 09/23/16  0226  By signing my name below, I, [name] [name]. [name] [name], attest that this documentation has been prepared under the direction and in the presence of [name] Greek, MD.  Electronically Signed: Maud [name]. [name] [name], ED Scribe. 09/23/16. 3:24 AM.    History   Chief Complaint Chief Complaint  Patient presents with  . Chest Pain   The history is provided by the patient. No language interpreter was used.    HPI Comments: [name] [name] is a 60 y.o. male with a PMHx of CAD, ESRD, GERD, hyperlipidemia, and HTN who presents to the Emergency Department complaining of constant, unchanged central chest pain that radiates to the back onset earlier this afternoon while undergoing routine dialysis treatment. Pain is described as sharp. No aggravating or alleviating factors reported. He also reports intermittent nausea and shortness of breath. No OTC/prescribed medications attempted prior to arrival. Denies any recent fever, chills, vomiting, or abdominal pain.  PCP: [name] Shoulder, MD    Past Medical History:  Diagnosis Date  . Anemia   . Anginal pain ([place])   . CAD (coronary artery disease)    a. per [facility] s/p 3.60mm x 9mm Vision BMS to mid LAD 12/2009 and Xience DES to mid LAD 10/2010.  [name] Chronic diastolic CHF (congestive heart failure) ([place])   . Colon polyps   . Daily headache   . ESRD on dialysis [facility]) since ~ 2008   "[place]; TTS" (07/21/2015)  . GERD (gastroesophageal reflux disease)   . Heart murmur   . Hematochezia    a. 2014: colonscopy, which showed moderately-sized internal hemorrhoids, two 60mm polyps in transverse colon and ascending colon that were resected, five 2-3mm polyps in sigmoid colon, descending colon, transverse colon, and ascending colon that were resected. An upper endoscopy was performed and showed normal esophagus, stomach, and duodenum.  . Hematuria    a. H/o hematuria 2014 with  cystoscopy that was unrevealing for his source of hematuria. He underwent a kidney ultrasound on 10/14 that showed mildly echogenic and scarred kidneys compatible with medical renal disease, without hydronephrosis or renal calculi.  [name] History of blood transfusion    "had colonoscopy done; they had to give me some blood"  . Hyperlipidemia   . Hypertension   . On home oxygen therapy    "2L prn" (07/21/2015)  . Renal insufficiency   . 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 Type II diabetes mellitus [facility])     Patient Active Problem List   Diagnosis Date Noted  . Nausea with vomiting 04/02/2016  . Chest pain 03/23/2016  . Pulmonary edema 03/18/2016  . ESRD (end stage renal disease) ([place]) 03/18/2016  . Fluid overload 03/18/2016  . End-stage renal disease on hemodialysis ([place])   . Hypertensive heart disease with heart failure ([place])   . Mitral regurgitation   . Hyperkalemia 03/10/2016  . Acute pulmonary edema ([facility])   . Dyspnea 02/19/2016  . Acute on chronic diastolic CHF (congestive heart failure), NYHA class 1 ([place]) 01/02/2016  . ESRD (end stage renal disease) on dialysis ([place]) 01/01/2016  . Elevated troponin   . Emesis 12/23/2015  . Leukocytosis 12/23/2015  . Right sided weakness 10/28/2015  . Diabetic neuropathy ([place]) 07/29/2015  . Depression 07/22/2015  . GERD (gastroesophageal reflux disease) 06/04/2015  . Malnutrition of moderate degree ([place]) 05/17/2015  . Thrombocytopenia ([place]) 05/16/2015  . Weight loss 05/16/2015  .  Angina of effort ([place]) 05/16/2015  . H/O TIA (transient ischemic attack) 04/01/2015  . Diastolic dysfunction-grade 2 12/13/2014  . Hypertension 04/27/2014  . CAD -S/P LAD BMS 2011, LAD DES 2012- patent cors Feb 2016 04/27/2014  . Diabetes mellitus type 2, diet-controlled ([place]) 04/27/2014  . Chest pain, atypical 04/26/2014    Past Surgical History:  Procedure Laterality Date  . AV FISTULA PLACEMENT Left ~ 2007   "upper arm"  .  CARDIAC CATHETERIZATION  "several"  . CORONARY ANGIOPLASTY WITH STENT PLACEMENT  "several"  . CYSTOSCOPY W/ STONE MANIPULATION  X2?  . EYE SURGERY Bilateral    "laser OR for hemorrhage"  . LEFT HEART CATHETERIZATION WITH CORONARY ANGIOGRAM N/A 11/23/2014   Procedure: LEFT HEART CATHETERIZATION WITH CORONARY ANGIOGRAM;  Surgeon: Troy Sine, MD;  Location: [facility] CATH LAB;  Service: Cardiovascular;  Laterality: N/A;  . LITHOTRIPSY  X1       Home Medications    Prior to Admission medications   Medication Sig Start Date End Date Taking? Authorizing Provider  albuterol (PROVENTIL HFA;VENTOLIN HFA) 108 (90 Base) MCG/ACT inhaler Inhale 1-2 puffs into the lungs every 6 (six) hours as needed for wheezing or shortness of breath. 01/19/16  Yes Delsa Grana, PA-C  acetaminophen (TYLENOL) 325 MG tablet Take 2 tablets (650 mg total) by mouth every 6 (six) hours as needed for mild pain (or Fever >/= 101). Patient not taking: Reported on 09/23/2016 03/27/16   Kinnie Feil, MD  amitriptyline (ELAVIL) 100 MG tablet Take 1 tablet (100 mg total) by mouth at bedtime. Patient not taking: Reported on 09/23/2016 07/27/15   Shela Leff, MD  amLODipine (NORVASC) 10 MG tablet Take 1 tablet (10 mg total) by mouth daily. 10/30/15   Ripudeep Krystal Eaton, MD  aspirin 81 MG chewable tablet Chew 1 tablet (81 mg total) by mouth daily. 07/22/15   Iline Oven, MD  atorvastatin (LIPITOR) 20 MG tablet Take 20 mg by mouth at bedtime. Reported on 03/14/2016    Historical Provider, MD  carvedilol (COREG) 25 MG tablet Take 1 tablet (25 mg total) by mouth 2 (two) times daily with a meal. 03/12/16   Geradine Girt, DO  cinacalcet (SENSIPAR) 90 MG tablet Take 90 mg by mouth at bedtime.     Historical Provider, MD  esomeprazole (NEXIUM) 20 MG capsule Take 1 capsule (20 mg total) by mouth daily. Patient taking differently: Take 20 mg by mouth daily with supper.  04/02/16 04/02/17  Maryellen Pile, MD  hydrALAZINE (APRESOLINE) 25 MG  tablet Take 1 tablet (25 mg total) by mouth 3 (three) times daily. 03/18/16   Reyne Dumas, MD  HYDROcodone-acetaminophen (NORCO/VICODIN) 5-325 MG tablet Take 2 tablets by mouth every 4 (four) hours as needed for moderate pain. 09/23/16   [name] Greek, MD  losartan (COZAAR) 100 MG tablet Take 100 mg by mouth at bedtime. 04/11/16   Historical Provider, MD  multivitamin (RENA-VIT) TABS tablet Take 1 tablet by mouth at bedtime. 12/15/14   Geradine Girt, DO  pregabalin (LYRICA) 50 MG capsule Take 1 capsule (50 mg total) by mouth daily. Patient taking differently: Take 50 mg by mouth at bedtime.  12/15/14   Geradine Girt, DO  sevelamer carbonate (RENVELA) 800 MG tablet Take 4 tablets (3,200 mg total) by mouth 3 (three) times daily with meals. Patient taking differently: Take 1,600-3,200 mg by mouth See admin instructions. Take 4 tablets (3200 mg) by mouth with meals and 2 tablets (1600 mg) with snacks 03/12/16  Geradine Girt, DO  venlafaxine XR (EFFEXOR XR) 75 MG 24 hr capsule Take 1 capsule (75 mg total) by mouth daily with breakfast. 07/27/15   Shela Leff, MD    Family History Family History  Problem Relation Age of Onset  . Bone cancer Mother   . Anuerysm Father   . Hypertension    . Diabetes type II Daughter     Social History Social History  Substance Use Topics  . Smoking status: Former Smoker    Packs/day: 0.50    Years: 8.00    Types: Cigarettes    Quit date: 12/06/2010  . Smokeless tobacco: Never Used  . Alcohol use No     Allergies   Enalapril; Latex; and Tape   Review of Systems Review of Systems  Constitutional: Negative for chills and fever.  Respiratory: Positive for shortness of breath.   Cardiovascular: Positive for chest pain.  Gastrointestinal: Positive for nausea. Negative for vomiting.  Neurological: Negative for headaches.  Psychiatric/Behavioral: Negative for confusion.  All other systems reviewed and are negative.    Physical Exam Updated  Vital Signs BP 165/87   Pulse 82   Ht 6' (1.829 m)   Wt 198 lb 10.2 oz (90.1 kg)   SpO2 96%   BMI 26.94 kg/m   Physical Exam  Constitutional: He is oriented to person, place, and time. He appears well-developed and well-nourished. No distress.  HENT:  Head: Normocephalic and atraumatic.  Right Ear: Hearing normal.  Left Ear: Hearing normal.  Nose: Nose normal.  Mouth/Throat: Oropharynx is clear and moist and mucous membranes are normal.  Eyes: Conjunctivae and EOM are normal. Pupils are equal, round, and reactive to light.  Neck: Normal range of motion. Neck supple.  Cardiovascular: Regular rhythm, S1 normal and S2 normal.  Exam reveals no gallop and no friction rub.   No murmur heard. Pulmonary/Chest: Effort normal and breath sounds normal. No respiratory distress. He exhibits no tenderness.  Could not reproduce chest pain.  Abdominal: Soft. Normal appearance and bowel sounds are normal. There is no hepatosplenomegaly. There is no tenderness. There is no rebound, no guarding, no tenderness at McBurney's point and negative Murphy's sign. No hernia.  Musculoskeletal: Normal range of motion.  Neurological: He is alert and oriented to person, place, and time. He has normal strength. No cranial nerve deficit or sensory deficit. Coordination normal. GCS eye subscore is 4. GCS verbal subscore is 5. GCS motor subscore is 6.  Skin: Skin is warm, dry and intact. No rash noted. No cyanosis.  Psychiatric: He has a normal mood and affect. His speech is normal and behavior is normal. Thought content normal.  Nursing note and vitals reviewed.    ED Treatments / Results   DIAGNOSTIC STUDIES: Oxygen Saturation is 97% on RA, Normal by my interpretation.    COORDINATION OF CARE: 3:01 AM- Will order imaging, EKG, and blood work. Discussed treatment plan with pt at bedside and pt agreed to plan.     Labs (all labs ordered are listed, but only abnormal results are displayed) Labs Reviewed    BASIC METABOLIC PANEL - Abnormal; Notable for the following:       Result Value   Chloride 95 (*)    BUN 30 (*)    Creatinine, Ser 8.48 (*)    GFR calc non Af Amer 6 (*)    GFR calc Af Amer 7 (*)    All other components within normal limits  CBC - Abnormal; Notable for the  following:    RDW 17.4 (*)    All other components within normal limits  TROPONIN I  TROPONIN I    EKG  EKG Interpretation  Date/Time:  Sunday September 23 2016 02:31:50 EST Ventricular Rate:  111 PR Interval:  136 QRS Duration: 96 QT Interval:  350 QTC Calculation: 476 R Axis:   97 Text Interpretation:  Sinus tachycardia Rightward axis Borderline ECG No significant change since last tracing Confirmed by Nickisha Hum  MD, [place] [phone]) on 09/23/2016 8:03:28 AM       Radiology Dg Chest 2 View  Result Date: 09/23/2016 CLINICAL DATA:  Mid chest pain and shortness of breath beginning this morning, nausea. History of end-stage renal disease on dialysis, status post dialysis yesterday. History of diabetes, tuberculosis. EXAM: CHEST  2 VIEW COMPARISON:  Chest radiograph August 29, 2016 FINDINGS: Cardiomediastinal silhouette is normal. Coronary artery calcification versus stent. No pleural effusions or focal consolidations. Mild bronchitic changes. Mildly elevated LEFT hemidiaphragm. Trachea projects midline and there is no pneumothorax. Soft tissue planes and included osseous structures are non-suspicious. IMPRESSION: Mild bronchitic changes without focal consolidation. Electronically Signed   By: Elon Alas M.D.   On: 09/23/2016 04:00   Ct Angio Chest Pe W Or Wo Contrast  Result Date: 09/23/2016 CLINICAL DATA:  Shortness of breath. Upper left chest pain. Dialysis patient. EXAM: CT ANGIOGRAPHY CHEST WITH CONTRAST TECHNIQUE: Multidetector CT imaging of the chest was performed using the standard protocol during bolus administration of intravenous contrast. Multiplanar CT image reconstructions and MIPs were  obtained to evaluate the vascular anatomy. CONTRAST:  100 cc Isovue 370 IV COMPARISON:  Chest x-ray earlier today. FINDINGS: Cardiovascular: No filling defects in the pulmonary arteries to suggest pulmonary emboli. Scattered aortic arch and descending aortic calcifications. No aneurysm. Mild cardiomegaly. Extensive coronary artery calcifications in the left anterior descending and right coronary artery. Mediastinum/Nodes: No mediastinal, hilar, or axillary adenopathy. Lungs/Pleura: Lungs are clear. No focal airspace opacities or suspicious nodules. No effusions. Upper Abdomen: Imaging into the upper abdomen shows no acute findings. Musculoskeletal: Chest wall soft tissues are unremarkable. No acute bony abnormality or focal bone lesion. Review of the MIP images confirms the above findings. IMPRESSION: No evidence of pulmonary embolus. Cardiomegaly.  Coronary artery disease, aortic atherosclerosis. No acute cardiopulmonary disease. Electronically Signed   By: Rolm Baptise M.D.   On: 09/23/2016 08:05    Procedures Procedures (including critical care time)  Medications Ordered in ED Medications  ondansetron (ZOFRAN) injection 4 mg (4 mg Intravenous Given 09/23/16 0442)  HYDROmorphone (DILAUDID) injection 1 mg (1 mg Intravenous Given 09/23/16 0442)  iopamidol (ISOVUE-370) 76 % injection (100 mLs  Contrast Given 09/23/16 0706)     Initial Impression / Assessment and Plan / ED Course  I have reviewed the triage vital signs and the nursing notes.  Pertinent labs & imaging results that were available during my care of the patient were reviewed by me and considered in my medical decision making (see chart for details).  Clinical Course   Patient presents to the emergency department with atypical chest pain. Patient reports that he has been experiencing pain in the center of his chest that radiates into his back continuously since this afternoon. He has not identified any alleviating or exacerbating  factors. Pain was not reproducible on examination. Pain is not related to exertion and does not resemble angina. His EKG is unchanged from previous. Troponin has been negative for 2 draws.   CT angiography was performed to evaluate for PE.  No evidence of PE, no aortic dissection noted. Patient will be treated with analgesia, follow up with primary doctor.  Final Clinical Impressions(s) / ED Diagnoses   Final diagnoses:  Chest pain, unspecified type    New Prescriptions New Prescriptions   HYDROCODONE-ACETAMINOPHEN (NORCO/VICODIN) 5-325 MG TABLET    Take 2 tablets by mouth every 4 (four) hours as needed for moderate pain.   I personally performed the services described in this documentation, which was scribed in my presence. The recorded information has been reviewed and is accurate.    [name] Greek, MD 09/23/16 [phone]

## 2016-09-23 NOTE — ED Notes (Signed)
IV team order placed

## 2016-09-23 NOTE — ED Notes (Signed)
Patient transported to CT 

## 2016-09-23 NOTE — ED Triage Notes (Signed)
The pt is c/o mid-chest pain and sob since this am  With nnausea.  He is a dialysis pt that was diakyzed yesterday.  He had it during dialysis but thought it would go away but it is worse now  Fistula lt arm

## 2016-09-25 DIAGNOSIS — D509 Iron deficiency anemia, unspecified: Secondary | ICD-10-CM | POA: Diagnosis not present

## 2016-09-25 DIAGNOSIS — N2581 Secondary hyperparathyroidism of renal origin: Secondary | ICD-10-CM | POA: Diagnosis not present

## 2016-09-25 DIAGNOSIS — N186 End stage renal disease: Secondary | ICD-10-CM | POA: Diagnosis not present

## 2016-09-25 DIAGNOSIS — E1129 Type 2 diabetes mellitus with other diabetic kidney complication: Secondary | ICD-10-CM | POA: Diagnosis not present

## 2016-09-27 DIAGNOSIS — N186 End stage renal disease: Secondary | ICD-10-CM | POA: Diagnosis not present

## 2016-09-27 DIAGNOSIS — E1129 Type 2 diabetes mellitus with other diabetic kidney complication: Secondary | ICD-10-CM | POA: Diagnosis not present

## 2016-09-27 DIAGNOSIS — D509 Iron deficiency anemia, unspecified: Secondary | ICD-10-CM | POA: Diagnosis not present

## 2016-09-27 DIAGNOSIS — N2581 Secondary hyperparathyroidism of renal origin: Secondary | ICD-10-CM | POA: Diagnosis not present

## 2016-09-29 DIAGNOSIS — D509 Iron deficiency anemia, unspecified: Secondary | ICD-10-CM | POA: Diagnosis not present

## 2016-09-29 DIAGNOSIS — N186 End stage renal disease: Secondary | ICD-10-CM | POA: Diagnosis not present

## 2016-09-29 DIAGNOSIS — E1129 Type 2 diabetes mellitus with other diabetic kidney complication: Secondary | ICD-10-CM | POA: Diagnosis not present

## 2016-09-29 DIAGNOSIS — N2581 Secondary hyperparathyroidism of renal origin: Secondary | ICD-10-CM | POA: Diagnosis not present

## 2016-10-02 DIAGNOSIS — N186 End stage renal disease: Secondary | ICD-10-CM | POA: Diagnosis not present

## 2016-10-02 DIAGNOSIS — D509 Iron deficiency anemia, unspecified: Secondary | ICD-10-CM | POA: Diagnosis not present

## 2016-10-02 DIAGNOSIS — E1129 Type 2 diabetes mellitus with other diabetic kidney complication: Secondary | ICD-10-CM | POA: Diagnosis not present

## 2016-10-02 DIAGNOSIS — N2581 Secondary hyperparathyroidism of renal origin: Secondary | ICD-10-CM | POA: Diagnosis not present

## 2016-10-04 DIAGNOSIS — N186 End stage renal disease: Secondary | ICD-10-CM | POA: Diagnosis not present

## 2016-10-04 DIAGNOSIS — D509 Iron deficiency anemia, unspecified: Secondary | ICD-10-CM | POA: Diagnosis not present

## 2016-10-04 DIAGNOSIS — N2581 Secondary hyperparathyroidism of renal origin: Secondary | ICD-10-CM | POA: Diagnosis not present

## 2016-10-04 DIAGNOSIS — E1129 Type 2 diabetes mellitus with other diabetic kidney complication: Secondary | ICD-10-CM | POA: Diagnosis not present

## 2016-10-06 DIAGNOSIS — N2581 Secondary hyperparathyroidism of renal origin: Secondary | ICD-10-CM | POA: Diagnosis not present

## 2016-10-06 DIAGNOSIS — E1129 Type 2 diabetes mellitus with other diabetic kidney complication: Secondary | ICD-10-CM | POA: Diagnosis not present

## 2016-10-06 DIAGNOSIS — D509 Iron deficiency anemia, unspecified: Secondary | ICD-10-CM | POA: Diagnosis not present

## 2016-10-06 DIAGNOSIS — N186 End stage renal disease: Secondary | ICD-10-CM | POA: Diagnosis not present

## 2016-10-07 DIAGNOSIS — N186 End stage renal disease: Secondary | ICD-10-CM | POA: Diagnosis not present

## 2016-10-07 DIAGNOSIS — Z992 Dependence on renal dialysis: Secondary | ICD-10-CM | POA: Diagnosis not present

## 2016-10-07 DIAGNOSIS — E1122 Type 2 diabetes mellitus with diabetic chronic kidney disease: Secondary | ICD-10-CM | POA: Diagnosis not present

## 2016-10-09 DIAGNOSIS — N186 End stage renal disease: Secondary | ICD-10-CM | POA: Diagnosis not present

## 2016-10-09 DIAGNOSIS — E1129 Type 2 diabetes mellitus with other diabetic kidney complication: Secondary | ICD-10-CM | POA: Diagnosis not present

## 2016-10-09 DIAGNOSIS — D509 Iron deficiency anemia, unspecified: Secondary | ICD-10-CM | POA: Diagnosis not present

## 2016-10-09 DIAGNOSIS — N2581 Secondary hyperparathyroidism of renal origin: Secondary | ICD-10-CM | POA: Diagnosis not present

## 2016-10-09 DIAGNOSIS — D631 Anemia in chronic kidney disease: Secondary | ICD-10-CM | POA: Diagnosis not present

## 2016-10-11 DIAGNOSIS — D509 Iron deficiency anemia, unspecified: Secondary | ICD-10-CM | POA: Diagnosis not present

## 2016-10-11 DIAGNOSIS — N186 End stage renal disease: Secondary | ICD-10-CM | POA: Diagnosis not present

## 2016-10-11 DIAGNOSIS — N2581 Secondary hyperparathyroidism of renal origin: Secondary | ICD-10-CM | POA: Diagnosis not present

## 2016-10-11 DIAGNOSIS — D631 Anemia in chronic kidney disease: Secondary | ICD-10-CM | POA: Diagnosis not present

## 2016-10-11 DIAGNOSIS — E1129 Type 2 diabetes mellitus with other diabetic kidney complication: Secondary | ICD-10-CM | POA: Diagnosis not present

## 2016-10-13 DIAGNOSIS — N186 End stage renal disease: Secondary | ICD-10-CM | POA: Diagnosis not present

## 2016-10-13 DIAGNOSIS — D631 Anemia in chronic kidney disease: Secondary | ICD-10-CM | POA: Diagnosis not present

## 2016-10-13 DIAGNOSIS — D509 Iron deficiency anemia, unspecified: Secondary | ICD-10-CM | POA: Diagnosis not present

## 2016-10-13 DIAGNOSIS — N2581 Secondary hyperparathyroidism of renal origin: Secondary | ICD-10-CM | POA: Diagnosis not present

## 2016-10-13 DIAGNOSIS — E1129 Type 2 diabetes mellitus with other diabetic kidney complication: Secondary | ICD-10-CM | POA: Diagnosis not present

## 2016-10-16 DIAGNOSIS — N2581 Secondary hyperparathyroidism of renal origin: Secondary | ICD-10-CM | POA: Diagnosis not present

## 2016-10-16 DIAGNOSIS — D631 Anemia in chronic kidney disease: Secondary | ICD-10-CM | POA: Diagnosis not present

## 2016-10-16 DIAGNOSIS — D509 Iron deficiency anemia, unspecified: Secondary | ICD-10-CM | POA: Diagnosis not present

## 2016-10-16 DIAGNOSIS — E1129 Type 2 diabetes mellitus with other diabetic kidney complication: Secondary | ICD-10-CM | POA: Diagnosis not present

## 2016-10-16 DIAGNOSIS — N186 End stage renal disease: Secondary | ICD-10-CM | POA: Diagnosis not present

## 2016-10-18 DIAGNOSIS — E1129 Type 2 diabetes mellitus with other diabetic kidney complication: Secondary | ICD-10-CM | POA: Diagnosis not present

## 2016-10-18 DIAGNOSIS — D509 Iron deficiency anemia, unspecified: Secondary | ICD-10-CM | POA: Diagnosis not present

## 2016-10-18 DIAGNOSIS — N2581 Secondary hyperparathyroidism of renal origin: Secondary | ICD-10-CM | POA: Diagnosis not present

## 2016-10-18 DIAGNOSIS — N186 End stage renal disease: Secondary | ICD-10-CM | POA: Diagnosis not present

## 2016-10-18 DIAGNOSIS — D631 Anemia in chronic kidney disease: Secondary | ICD-10-CM | POA: Diagnosis not present

## 2016-10-20 DIAGNOSIS — N186 End stage renal disease: Secondary | ICD-10-CM | POA: Diagnosis not present

## 2016-10-20 DIAGNOSIS — E1129 Type 2 diabetes mellitus with other diabetic kidney complication: Secondary | ICD-10-CM | POA: Diagnosis not present

## 2016-10-20 DIAGNOSIS — N2581 Secondary hyperparathyroidism of renal origin: Secondary | ICD-10-CM | POA: Diagnosis not present

## 2016-10-20 DIAGNOSIS — D509 Iron deficiency anemia, unspecified: Secondary | ICD-10-CM | POA: Diagnosis not present

## 2016-10-20 DIAGNOSIS — D631 Anemia in chronic kidney disease: Secondary | ICD-10-CM | POA: Diagnosis not present

## 2016-10-22 DIAGNOSIS — E1129 Type 2 diabetes mellitus with other diabetic kidney complication: Secondary | ICD-10-CM | POA: Diagnosis not present

## 2016-10-22 DIAGNOSIS — N186 End stage renal disease: Secondary | ICD-10-CM | POA: Diagnosis not present

## 2016-10-22 DIAGNOSIS — D631 Anemia in chronic kidney disease: Secondary | ICD-10-CM | POA: Diagnosis not present

## 2016-10-22 DIAGNOSIS — D509 Iron deficiency anemia, unspecified: Secondary | ICD-10-CM | POA: Diagnosis not present

## 2016-10-22 DIAGNOSIS — N2581 Secondary hyperparathyroidism of renal origin: Secondary | ICD-10-CM | POA: Diagnosis not present

## 2016-10-25 DIAGNOSIS — D631 Anemia in chronic kidney disease: Secondary | ICD-10-CM | POA: Diagnosis not present

## 2016-10-25 DIAGNOSIS — N186 End stage renal disease: Secondary | ICD-10-CM | POA: Diagnosis not present

## 2016-10-25 DIAGNOSIS — D509 Iron deficiency anemia, unspecified: Secondary | ICD-10-CM | POA: Diagnosis not present

## 2016-10-25 DIAGNOSIS — N2581 Secondary hyperparathyroidism of renal origin: Secondary | ICD-10-CM | POA: Diagnosis not present

## 2016-10-25 DIAGNOSIS — E1129 Type 2 diabetes mellitus with other diabetic kidney complication: Secondary | ICD-10-CM | POA: Diagnosis not present

## 2016-10-26 DIAGNOSIS — D631 Anemia in chronic kidney disease: Secondary | ICD-10-CM | POA: Diagnosis not present

## 2016-10-26 DIAGNOSIS — N2581 Secondary hyperparathyroidism of renal origin: Secondary | ICD-10-CM | POA: Diagnosis not present

## 2016-10-26 DIAGNOSIS — D509 Iron deficiency anemia, unspecified: Secondary | ICD-10-CM | POA: Diagnosis not present

## 2016-10-26 DIAGNOSIS — N186 End stage renal disease: Secondary | ICD-10-CM | POA: Diagnosis not present

## 2016-10-26 DIAGNOSIS — E1129 Type 2 diabetes mellitus with other diabetic kidney complication: Secondary | ICD-10-CM | POA: Diagnosis not present

## 2016-10-29 DIAGNOSIS — D631 Anemia in chronic kidney disease: Secondary | ICD-10-CM | POA: Diagnosis not present

## 2016-10-29 DIAGNOSIS — N186 End stage renal disease: Secondary | ICD-10-CM | POA: Diagnosis not present

## 2016-10-29 DIAGNOSIS — N2581 Secondary hyperparathyroidism of renal origin: Secondary | ICD-10-CM | POA: Diagnosis not present

## 2016-10-29 DIAGNOSIS — E1129 Type 2 diabetes mellitus with other diabetic kidney complication: Secondary | ICD-10-CM | POA: Diagnosis not present

## 2016-10-29 DIAGNOSIS — D509 Iron deficiency anemia, unspecified: Secondary | ICD-10-CM | POA: Diagnosis not present

## 2016-10-31 DIAGNOSIS — E1129 Type 2 diabetes mellitus with other diabetic kidney complication: Secondary | ICD-10-CM | POA: Diagnosis not present

## 2016-10-31 DIAGNOSIS — D631 Anemia in chronic kidney disease: Secondary | ICD-10-CM | POA: Diagnosis not present

## 2016-10-31 DIAGNOSIS — N2581 Secondary hyperparathyroidism of renal origin: Secondary | ICD-10-CM | POA: Diagnosis not present

## 2016-10-31 DIAGNOSIS — N186 End stage renal disease: Secondary | ICD-10-CM | POA: Diagnosis not present

## 2016-10-31 DIAGNOSIS — D509 Iron deficiency anemia, unspecified: Secondary | ICD-10-CM | POA: Diagnosis not present

## 2016-11-02 DIAGNOSIS — E1129 Type 2 diabetes mellitus with other diabetic kidney complication: Secondary | ICD-10-CM | POA: Diagnosis not present

## 2016-11-02 DIAGNOSIS — N186 End stage renal disease: Secondary | ICD-10-CM | POA: Diagnosis not present

## 2016-11-02 DIAGNOSIS — D631 Anemia in chronic kidney disease: Secondary | ICD-10-CM | POA: Diagnosis not present

## 2016-11-02 DIAGNOSIS — N2581 Secondary hyperparathyroidism of renal origin: Secondary | ICD-10-CM | POA: Diagnosis not present

## 2016-11-02 DIAGNOSIS — D509 Iron deficiency anemia, unspecified: Secondary | ICD-10-CM | POA: Diagnosis not present

## 2016-11-05 DIAGNOSIS — N2581 Secondary hyperparathyroidism of renal origin: Secondary | ICD-10-CM | POA: Diagnosis not present

## 2016-11-05 DIAGNOSIS — N186 End stage renal disease: Secondary | ICD-10-CM | POA: Diagnosis not present

## 2016-11-05 DIAGNOSIS — E1129 Type 2 diabetes mellitus with other diabetic kidney complication: Secondary | ICD-10-CM | POA: Diagnosis not present

## 2016-11-05 DIAGNOSIS — D509 Iron deficiency anemia, unspecified: Secondary | ICD-10-CM | POA: Diagnosis not present

## 2016-11-05 DIAGNOSIS — D631 Anemia in chronic kidney disease: Secondary | ICD-10-CM | POA: Diagnosis not present

## 2016-11-07 DIAGNOSIS — N2581 Secondary hyperparathyroidism of renal origin: Secondary | ICD-10-CM | POA: Diagnosis not present

## 2016-11-07 DIAGNOSIS — D509 Iron deficiency anemia, unspecified: Secondary | ICD-10-CM | POA: Diagnosis not present

## 2016-11-07 DIAGNOSIS — E1122 Type 2 diabetes mellitus with diabetic chronic kidney disease: Secondary | ICD-10-CM | POA: Diagnosis not present

## 2016-11-07 DIAGNOSIS — E1129 Type 2 diabetes mellitus with other diabetic kidney complication: Secondary | ICD-10-CM | POA: Diagnosis not present

## 2016-11-07 DIAGNOSIS — Z992 Dependence on renal dialysis: Secondary | ICD-10-CM | POA: Diagnosis not present

## 2016-11-07 DIAGNOSIS — N186 End stage renal disease: Secondary | ICD-10-CM | POA: Diagnosis not present

## 2016-11-07 DIAGNOSIS — D631 Anemia in chronic kidney disease: Secondary | ICD-10-CM | POA: Diagnosis not present

## 2016-11-09 DIAGNOSIS — D509 Iron deficiency anemia, unspecified: Secondary | ICD-10-CM | POA: Diagnosis not present

## 2016-11-09 DIAGNOSIS — E1129 Type 2 diabetes mellitus with other diabetic kidney complication: Secondary | ICD-10-CM | POA: Diagnosis not present

## 2016-11-09 DIAGNOSIS — N2581 Secondary hyperparathyroidism of renal origin: Secondary | ICD-10-CM | POA: Diagnosis not present

## 2016-11-09 DIAGNOSIS — N186 End stage renal disease: Secondary | ICD-10-CM | POA: Diagnosis not present

## 2016-11-09 DIAGNOSIS — D631 Anemia in chronic kidney disease: Secondary | ICD-10-CM | POA: Diagnosis not present

## 2016-11-12 DIAGNOSIS — N186 End stage renal disease: Secondary | ICD-10-CM | POA: Diagnosis not present

## 2016-11-12 DIAGNOSIS — E1129 Type 2 diabetes mellitus with other diabetic kidney complication: Secondary | ICD-10-CM | POA: Diagnosis not present

## 2016-11-12 DIAGNOSIS — D631 Anemia in chronic kidney disease: Secondary | ICD-10-CM | POA: Diagnosis not present

## 2016-11-12 DIAGNOSIS — D509 Iron deficiency anemia, unspecified: Secondary | ICD-10-CM | POA: Diagnosis not present

## 2016-11-12 DIAGNOSIS — N2581 Secondary hyperparathyroidism of renal origin: Secondary | ICD-10-CM | POA: Diagnosis not present

## 2016-11-14 DIAGNOSIS — N2581 Secondary hyperparathyroidism of renal origin: Secondary | ICD-10-CM | POA: Diagnosis not present

## 2016-11-14 DIAGNOSIS — D509 Iron deficiency anemia, unspecified: Secondary | ICD-10-CM | POA: Diagnosis not present

## 2016-11-14 DIAGNOSIS — E1129 Type 2 diabetes mellitus with other diabetic kidney complication: Secondary | ICD-10-CM | POA: Diagnosis not present

## 2016-11-14 DIAGNOSIS — N186 End stage renal disease: Secondary | ICD-10-CM | POA: Diagnosis not present

## 2016-11-14 DIAGNOSIS — D631 Anemia in chronic kidney disease: Secondary | ICD-10-CM | POA: Diagnosis not present

## 2016-11-19 DIAGNOSIS — N2581 Secondary hyperparathyroidism of renal origin: Secondary | ICD-10-CM | POA: Diagnosis not present

## 2016-11-19 DIAGNOSIS — D631 Anemia in chronic kidney disease: Secondary | ICD-10-CM | POA: Diagnosis not present

## 2016-11-19 DIAGNOSIS — N186 End stage renal disease: Secondary | ICD-10-CM | POA: Diagnosis not present

## 2016-11-19 DIAGNOSIS — E1129 Type 2 diabetes mellitus with other diabetic kidney complication: Secondary | ICD-10-CM | POA: Diagnosis not present

## 2016-11-19 DIAGNOSIS — D509 Iron deficiency anemia, unspecified: Secondary | ICD-10-CM | POA: Diagnosis not present

## 2016-11-20 ENCOUNTER — Ambulatory Visit (INDEPENDENT_AMBULATORY_CARE_PROVIDER_SITE_OTHER): Payer: Medicare Other | Admitting: Sports Medicine

## 2016-11-20 ENCOUNTER — Encounter: Payer: Self-pay | Admitting: Sports Medicine

## 2016-11-20 ENCOUNTER — Ambulatory Visit (INDEPENDENT_AMBULATORY_CARE_PROVIDER_SITE_OTHER): Payer: Medicare Other

## 2016-11-20 DIAGNOSIS — M79674 Pain in right toe(s): Secondary | ICD-10-CM

## 2016-11-20 DIAGNOSIS — L97511 Non-pressure chronic ulcer of other part of right foot limited to breakdown of skin: Secondary | ICD-10-CM

## 2016-11-20 DIAGNOSIS — E0840 Diabetes mellitus due to underlying condition with diabetic neuropathy, unspecified: Secondary | ICD-10-CM | POA: Diagnosis not present

## 2016-11-20 DIAGNOSIS — N186 End stage renal disease: Secondary | ICD-10-CM

## 2016-11-20 DIAGNOSIS — Z992 Dependence on renal dialysis: Secondary | ICD-10-CM | POA: Diagnosis not present

## 2016-11-20 NOTE — Progress Notes (Signed)
Subjective: [name] [name] is a 61 y.o. male patient seen in office for evaluation of ulceration of the right great toe. Patient has a history of diabetes and a blood glucose level today of that is low near 60 after dialysis. Patient states that he pulled off skin on toe and got a lot of bleeding, soaked with epsom salt which helped.   Patient is changing the dressing using nothing at home states that the dialysis nurse told him to make an appt. Denies nausea/fever/vomiting/chills/night sweats/shortness of breath/pain. Patient has no other pedal complaints at this time.  Patient Active Problem List   Diagnosis Date Noted  . Nausea with vomiting 04/02/2016  . Chest pain 03/23/2016  . Pulmonary edema 03/18/2016  . ESRD (end stage renal disease) ([place]) 03/18/2016  . Fluid overload 03/18/2016  . End-stage renal disease on hemodialysis ([place])   . Hypertensive heart disease with heart failure ([place])   . Mitral regurgitation   . Hyperkalemia 03/10/2016  . Acute pulmonary edema (HCC)   . Dyspnea 02/19/2016  . Acute on chronic diastolic CHF (congestive heart failure), NYHA class 1 ([place]) 01/02/2016  . ESRD (end stage renal disease) on dialysis ([place]) 01/01/2016  . Elevated troponin   . Emesis 12/23/2015  . Leukocytosis 12/23/2015  . Right sided weakness 10/28/2015  . Diabetic neuropathy ([place]) 07/29/2015  . Depression 07/22/2015  . GERD (gastroesophageal reflux disease) 06/04/2015  . Malnutrition of moderate degree ([place]) 05/17/2015  . Thrombocytopenia ([place]) 05/16/2015  . Weight loss 05/16/2015  . Angina of effort ([place]) 05/16/2015  . H/O TIA (transient ischemic attack) 04/01/2015  . Diastolic dysfunction-grade 2 12/13/2014  . Hypertension 04/27/2014  . CAD -S/P LAD BMS 2011, LAD DES 2012- patent cors Feb 2016 04/27/2014  . Diabetes mellitus type 2, diet-controlled ([place]) 04/27/2014  . Chest pain, atypical 04/26/2014   Current Outpatient Prescriptions on File Prior to Visit  Medication Sig  Dispense Refill  . acetaminophen (TYLENOL) 325 MG tablet Take 2 tablets (650 mg total) by mouth every 6 (six) hours as needed for mild pain (or Fever >/= 101). (Patient not taking: Reported on 09/23/2016)    . albuterol (PROVENTIL HFA;VENTOLIN HFA) 108 (90 Base) MCG/ACT inhaler Inhale 1-2 puffs into the lungs every 6 (six) hours as needed for wheezing or shortness of breath. 1 Inhaler 0  . amitriptyline (ELAVIL) 100 MG tablet Take 1 tablet (100 mg total) by mouth at bedtime. (Patient not taking: Reported on 09/23/2016) 30 tablet 2  . amLODipine (NORVASC) 10 MG tablet Take 1 tablet (10 mg total) by mouth daily. 30 tablet 3  . aspirin 81 MG chewable tablet Chew 1 tablet (81 mg total) by mouth daily.    [name] atorvastatin (LIPITOR) 20 MG tablet Take 20 mg by mouth at bedtime. Reported on 03/14/2016    . carvedilol (COREG) 25 MG tablet Take 1 tablet (25 mg total) by mouth 2 (two) times daily with a meal. 60 tablet 0  . cinacalcet (SENSIPAR) 90 MG tablet Take 90 mg by mouth at bedtime.     [name] esomeprazole (NEXIUM) 20 MG capsule Take 1 capsule (20 mg total) by mouth daily. (Patient taking differently: Take 20 mg by mouth daily with supper. ) 30 capsule 1  . hydrALAZINE (APRESOLINE) 25 MG tablet Take 1 tablet (25 mg total) by mouth 3 (three) times daily. 90 tablet 1  . HYDROcodone-acetaminophen (NORCO/VICODIN) 5-325 MG tablet Take 2 tablets by mouth every 4 (four) hours as needed for moderate pain. 10 tablet 0  . losartan (  COZAAR) 100 MG tablet Take 100 mg by mouth at bedtime.  9  . multivitamin (RENA-VIT) TABS tablet Take 1 tablet by mouth at bedtime. 30 tablet 0  . pregabalin (LYRICA) 50 MG capsule Take 1 capsule (50 mg total) by mouth daily. (Patient taking differently: Take 50 mg by mouth at bedtime. ) 30 capsule 0  . sevelamer carbonate (RENVELA) 800 MG tablet Take 4 tablets (3,200 mg total) by mouth 3 (three) times daily with meals. (Patient taking differently: Take 1,600-3,200 mg by mouth See admin  instructions. Take 4 tablets (3200 mg) by mouth with meals and 2 tablets (1600 mg) with snacks) 320 tablet 0  . venlafaxine XR (EFFEXOR XR) 75 MG 24 hr capsule Take 1 capsule (75 mg total) by mouth daily with breakfast. 30 capsule 2   No current facility-administered medications on file prior to visit.    Allergies  Allergen Reactions  . Enalapril Hives  . Latex Rash  . Tape Rash    TAPE MAKES SKIN BREAK OUT AND TURN RED    Recent Results (from the past 2160 hour(s))  Comprehensive metabolic panel     Status: Abnormal   Collection Time: 08/28/16  7:00 PM  Result Value Ref Range   Sodium 138 135 - 145 mmol/L   Potassium 6.1 (H) 3.5 - 5.1 mmol/L   Chloride 98 (L) 101 - 111 mmol/L   CO2 22 22 - 32 mmol/L   Glucose, Bld 110 (H) 65 - 99 mg/dL   BUN 71 (H) 6 - 20 mg/dL   Creatinine, Ser 13.11 (H) 0.61 - 1.24 mg/dL   Calcium 7.5 (L) 8.9 - 10.3 mg/dL   Total Protein 7.9 6.5 - 8.1 g/dL   Albumin 3.9 3.5 - 5.0 g/dL   AST 9 (L) 15 - 41 U/L   ALT 9 (L) 17 - 63 U/L   Alkaline Phosphatase 66 38 - 126 U/L   Total Bilirubin 0.4 0.3 - 1.2 mg/dL   GFR calc non Af Amer 4 (L) >60 mL/min   GFR calc Af Amer 4 (L) >60 mL/min    Comment: (NOTE) The eGFR has been calculated using the CKD EPI equation. This calculation has not been validated in all clinical situations. eGFR's persistently <60 mL/min signify possible Chronic Kidney Disease.    Anion gap 18 (H) 5 - 15  CBC     Status: Abnormal   Collection Time: 08/28/16  7:00 PM  Result Value Ref Range   WBC 6.4 4.0 - 10.5 K/uL   RBC 4.24 4.22 - 5.81 MIL/uL   Hemoglobin 11.3 (L) 13.0 - 17.0 g/dL   HCT 34.8 (L) 39.0 - 52.0 %   MCV 82.1 78.0 - 100.0 fL   MCH 26.7 26.0 - 34.0 pg   MCHC 32.5 30.0 - 36.0 g/dL   RDW 18.3 (H) 11.5 - 15.5 %   Platelets 170 150 - 400 K/uL  I-stat troponin, ED     Status: None   Collection Time: 08/29/16 12:16 AM  Result Value Ref Range   Troponin i, poc 0.01 0.00 - 0.08 ng/mL   Comment 3            Comment: Due  to the release kinetics of cTnI, a negative result within the first hours of the onset of symptoms does not rule out myocardial infarction with certainty. If myocardial infarction is still suspected, repeat the test at appropriate intervals.   I-stat troponin, ED     Status: None   Collection Time:  08/29/16  3:04 AM  Result Value Ref Range   Troponin i, poc 0.02 0.00 - 0.08 ng/mL   Comment 3            Comment: Due to the release kinetics of cTnI, a negative result within the first hours of the onset of symptoms does not rule out myocardial infarction with certainty. If myocardial infarction is still suspected, repeat the test at appropriate intervals.   Glucose, capillary     Status: None   Collection Time: 08/29/16  8:12 AM  Result Value Ref Range   Glucose-Capillary 90 65 - 99 mg/dL  MRSA PCR Screening     Status: None   Collection Time: 08/29/16  8:19 AM  Result Value Ref Range   MRSA by PCR NEGATIVE NEGATIVE    Comment:        The GeneXpert MRSA Assay (FDA approved for NASAL specimens only), is one component of a comprehensive MRSA colonization surveillance program. It is not intended to diagnose MRSA infection nor to guide or monitor treatment for MRSA infections.   Troponin I     Status: None   Collection Time: 08/29/16 10:25 AM  Result Value Ref Range   Troponin I <0.03 <0.03 ng/mL  Renal function panel     Status: Abnormal   Collection Time: 08/29/16 10:25 AM  Result Value Ref Range   Sodium 139 135 - 145 mmol/L   Potassium 6.8 (HH) 3.5 - 5.1 mmol/L    Comment: NO VISIBLE HEMOLYSIS CRITICAL RESULT CALLED TO, READ BACK BY AND VERIFIED WITH: VERONICA THOMPSON,RN AT 1113 08/29/16 BY ZBEECH.    Chloride 99 (L) 101 - 111 mmol/L   CO2 24 22 - 32 mmol/L   Glucose, Bld 123 (H) 65 - 99 mg/dL   BUN 77 (H) 6 - 20 mg/dL   Creatinine, Ser 13.84 (H) 0.61 - 1.24 mg/dL   Calcium 8.0 (L) 8.9 - 10.3 mg/dL   Phosphorus 9.6 (H) 2.5 - 4.6 mg/dL   Albumin 3.7 3.5 - 5.0  g/dL   GFR calc non Af Amer 3 (L) >60 mL/min   GFR calc Af Amer 4 (L) >60 mL/min    Comment: (NOTE) The eGFR has been calculated using the CKD EPI equation. This calculation has not been validated in all clinical situations. eGFR's persistently <60 mL/min signify possible Chronic Kidney Disease.    Anion gap 16 (H) 5 - 15  CBC     Status: Abnormal   Collection Time: 08/29/16 10:45 AM  Result Value Ref Range   WBC 6.6 4.0 - 10.5 K/uL   RBC 4.42 4.22 - 5.81 MIL/uL   Hemoglobin 11.6 (L) 13.0 - 17.0 g/dL   HCT 36.1 (L) 39.0 - 52.0 %   MCV 81.7 78.0 - 100.0 fL   MCH 26.2 26.0 - 34.0 pg   MCHC 32.1 30.0 - 36.0 g/dL   RDW 18.3 (H) 11.5 - 15.5 %   Platelets 150 150 - 400 K/uL    Comment: PLATELET COUNT CONFIRMED BY SMEAR  Hepatitis B surface antigen     Status: None   Collection Time: 08/29/16 10:45 AM  Result Value Ref Range   Hepatitis B Surface Ag Negative Negative    Comment: (NOTE) Stat results given to Myrna Blazer 08/29/2016 at 21:38 Performed At: [facility] [address] [place], [place] [id number] Lindon Romp MD [id number]   Hepatitis B core antibody, total     Status: None   Collection Time: 08/29/16 10:45 AM  Result Value Ref Range   Hep B Core Total Ab Negative Negative    Comment: (NOTE) Performed At: [facility] [place], [place] [id number] Lindon Romp MD [id number]   Hepatitis B surface antibody     Status: None   Collection Time: 08/29/16 10:45 AM  Result Value Ref Range   Hep B S Ab Reactive     Comment: (NOTE)              Non Reactive: Inconsistent with immunity,                            less than 10 mIU/mL              Reactive:     Consistent with immunity,                            greater than 9.9 mIU/mL Performed At: [facility] [place], [place] [id number] Lindon Romp MD [id number]   Troponin I     Status: Abnormal   Collection Time: 08/29/16  3:46 PM  Result  Value Ref Range   Troponin I 0.03 (HH) <0.03 ng/mL    Comment: CRITICAL RESULT CALLED TO, READ BACK BY AND VERIFIED WITH: NARAMBAS,J RN 08/29/2016 1706 JORDANS   Basic metabolic panel     Status: Abnormal   Collection Time: 08/29/16  3:46 PM  Result Value Ref Range   Sodium 137 135 - 145 mmol/L   Potassium 4.0 3.5 - 5.1 mmol/L    Comment: DELTA CHECK NOTED   Chloride 94 (L) 101 - 111 mmol/L   CO2 27 22 - 32 mmol/L   Glucose, Bld 102 (H) 65 - 99 mg/dL   BUN 26 (H) 6 - 20 mg/dL   Creatinine, Ser 6.51 (H) 0.61 - 1.24 mg/dL    Comment: DELTA CHECK NOTED   Calcium 9.1 8.9 - 10.3 mg/dL   GFR calc non Af Amer 8 (L) >60 mL/min   GFR calc Af Amer 10 (L) >60 mL/min    Comment: (NOTE) The eGFR has been calculated using the CKD EPI equation. This calculation has not been validated in all clinical situations. eGFR's persistently <60 mL/min signify possible Chronic Kidney Disease.    Anion gap 16 (H) 5 - 15  Glucose, capillary     Status: Abnormal   Collection Time: 08/29/16  4:18 PM  Result Value Ref Range   Glucose-Capillary 117 (H) 65 - 99 mg/dL  Basic metabolic panel     Status: Abnormal   Collection Time: 09/23/16  2:42 AM  Result Value Ref Range   Sodium 136 135 - 145 mmol/L   Potassium 4.4 3.5 - 5.1 mmol/L   Chloride 95 (L) 101 - 111 mmol/L   CO2 27 22 - 32 mmol/L   Glucose, Bld 94 65 - 99 mg/dL   BUN 30 (H) 6 - 20 mg/dL   Creatinine, Ser 8.48 (H) 0.61 - 1.24 mg/dL   Calcium 9.5 8.9 - 10.3 mg/dL   GFR calc non Af Amer 6 (L) >60 mL/min   GFR calc Af Amer 7 (L) >60 mL/min    Comment: (NOTE) The eGFR has been calculated using the CKD EPI equation. This calculation has not been validated in all clinical situations. eGFR's persistently <60 mL/min signify possible Chronic Kidney Disease.    Anion gap 14 5 - 15  CBC     Status: Abnormal   Collection Time: 09/23/16  2:42 AM  Result Value Ref Range   WBC 5.8 4.0 - 10.5 K/uL   RBC 5.02 4.22 - 5.81 MIL/uL   Hemoglobin 13.7 13.0  - 17.0 g/dL   HCT 41.7 39.0 - 52.0 %   MCV 83.1 78.0 - 100.0 fL   MCH 27.3 26.0 - 34.0 pg   MCHC 32.9 30.0 - 36.0 g/dL   RDW 17.4 (H) 11.5 - 15.5 %   Platelets 208 150 - 400 K/uL  Troponin I     Status: None   Collection Time: 09/23/16  2:42 AM  Result Value Ref Range   Troponin I <0.03 <0.03 ng/mL  Troponin I     Status: None   Collection Time: 09/23/16  6:09 AM  Result Value Ref Range   Troponin I <0.03 <0.03 ng/mL    Objective: There were no vitals filed for this visit.  General: Patient is awake, alert, oriented x 3 and in no acute distress.  Dermatology: Skin is warm and dry bilateral with a partial thickness ulceration present right plantar medial hallux x 2. Ulcerations measure 0.3 cm x 0.3 cm x 0.1 cm. There is a keratotic border with a granular base. The ulcerations do not probe to bone. There is no malodor, no active drainage, no erythema, no edema. No acute signs of infection.   Vascular: Dorsalis Pedis pulse = 2/4 Bilateral,  Posterior Tibial pulse = 1/4 Bilateral,  Capillary Fill Time < 5 seconds  Neurologic: Protective sensation diminished bilateral.   Musculosketal: No Pain with palpation to ulcerated area. No pain with compression to calves bilateral. No gross bony deformities noted bilateral.  Xrays, Right:  No defect at ulcerated area at hallux. No bony destruction suggestive of osteomyelitis. No gas in soft tissues.   Assessment and Plan:  Problem List Items Addressed This Visit      Genitourinary   ESRD (end stage renal disease) on dialysis [facility])    Other Visit Diagnoses    Great toe pain, right    -  Primary   Relevant Orders   DG Foot 2 Views Right   Skin ulcer of right great toe, limited to breakdown of skin ([place])       Diabetes mellitus due to underlying condition with diabetic neuropathy, unspecified long term insulin use status ([place])          -Examined patient and discussed the progression of the wound and treatment alternatives. -Xrays  reviewed - Excisionally dedbrided ulcerations to healthy bleeding borders using a sterile chisel blade. -Applied antibiotic cream and dry sterile dressing and instructed patient to continue with daily dressings at home consisting of the same - Advised patient to go to the ER or return to office if the wound worsens or if constitutional symptoms are present. -Patient to return to office in 2 weeks for follow up care and evaluation or sooner if problems arise.  Landis Martins, DPM

## 2016-11-21 DIAGNOSIS — N186 End stage renal disease: Secondary | ICD-10-CM | POA: Diagnosis not present

## 2016-11-21 DIAGNOSIS — D509 Iron deficiency anemia, unspecified: Secondary | ICD-10-CM | POA: Diagnosis not present

## 2016-11-21 DIAGNOSIS — E1129 Type 2 diabetes mellitus with other diabetic kidney complication: Secondary | ICD-10-CM | POA: Diagnosis not present

## 2016-11-21 DIAGNOSIS — D631 Anemia in chronic kidney disease: Secondary | ICD-10-CM | POA: Diagnosis not present

## 2016-11-21 DIAGNOSIS — N2581 Secondary hyperparathyroidism of renal origin: Secondary | ICD-10-CM | POA: Diagnosis not present

## 2016-11-23 DIAGNOSIS — D509 Iron deficiency anemia, unspecified: Secondary | ICD-10-CM | POA: Diagnosis not present

## 2016-11-23 DIAGNOSIS — N2581 Secondary hyperparathyroidism of renal origin: Secondary | ICD-10-CM | POA: Diagnosis not present

## 2016-11-23 DIAGNOSIS — E1129 Type 2 diabetes mellitus with other diabetic kidney complication: Secondary | ICD-10-CM | POA: Diagnosis not present

## 2016-11-23 DIAGNOSIS — N186 End stage renal disease: Secondary | ICD-10-CM | POA: Diagnosis not present

## 2016-11-23 DIAGNOSIS — D631 Anemia in chronic kidney disease: Secondary | ICD-10-CM | POA: Diagnosis not present

## 2016-11-26 DIAGNOSIS — N186 End stage renal disease: Secondary | ICD-10-CM | POA: Diagnosis not present

## 2016-11-26 DIAGNOSIS — D509 Iron deficiency anemia, unspecified: Secondary | ICD-10-CM | POA: Diagnosis not present

## 2016-11-26 DIAGNOSIS — D631 Anemia in chronic kidney disease: Secondary | ICD-10-CM | POA: Diagnosis not present

## 2016-11-26 DIAGNOSIS — N2581 Secondary hyperparathyroidism of renal origin: Secondary | ICD-10-CM | POA: Diagnosis not present

## 2016-11-26 DIAGNOSIS — E1129 Type 2 diabetes mellitus with other diabetic kidney complication: Secondary | ICD-10-CM | POA: Diagnosis not present

## 2016-11-28 DIAGNOSIS — D509 Iron deficiency anemia, unspecified: Secondary | ICD-10-CM | POA: Diagnosis not present

## 2016-11-28 DIAGNOSIS — N2581 Secondary hyperparathyroidism of renal origin: Secondary | ICD-10-CM | POA: Diagnosis not present

## 2016-11-28 DIAGNOSIS — E1129 Type 2 diabetes mellitus with other diabetic kidney complication: Secondary | ICD-10-CM | POA: Diagnosis not present

## 2016-11-28 DIAGNOSIS — N186 End stage renal disease: Secondary | ICD-10-CM | POA: Diagnosis not present

## 2016-11-28 DIAGNOSIS — D631 Anemia in chronic kidney disease: Secondary | ICD-10-CM | POA: Diagnosis not present

## 2016-11-30 DIAGNOSIS — N2581 Secondary hyperparathyroidism of renal origin: Secondary | ICD-10-CM | POA: Diagnosis not present

## 2016-11-30 DIAGNOSIS — E1129 Type 2 diabetes mellitus with other diabetic kidney complication: Secondary | ICD-10-CM | POA: Diagnosis not present

## 2016-11-30 DIAGNOSIS — D631 Anemia in chronic kidney disease: Secondary | ICD-10-CM | POA: Diagnosis not present

## 2016-11-30 DIAGNOSIS — N186 End stage renal disease: Secondary | ICD-10-CM | POA: Diagnosis not present

## 2016-11-30 DIAGNOSIS — D509 Iron deficiency anemia, unspecified: Secondary | ICD-10-CM | POA: Diagnosis not present

## 2016-12-03 DIAGNOSIS — E1129 Type 2 diabetes mellitus with other diabetic kidney complication: Secondary | ICD-10-CM | POA: Diagnosis not present

## 2016-12-03 DIAGNOSIS — N2581 Secondary hyperparathyroidism of renal origin: Secondary | ICD-10-CM | POA: Diagnosis not present

## 2016-12-03 DIAGNOSIS — D509 Iron deficiency anemia, unspecified: Secondary | ICD-10-CM | POA: Diagnosis not present

## 2016-12-03 DIAGNOSIS — N186 End stage renal disease: Secondary | ICD-10-CM | POA: Diagnosis not present

## 2016-12-03 DIAGNOSIS — D631 Anemia in chronic kidney disease: Secondary | ICD-10-CM | POA: Diagnosis not present

## 2016-12-04 ENCOUNTER — Ambulatory Visit: Payer: Medicare Other | Admitting: Sports Medicine

## 2016-12-05 DIAGNOSIS — E1122 Type 2 diabetes mellitus with diabetic chronic kidney disease: Secondary | ICD-10-CM | POA: Diagnosis not present

## 2016-12-05 DIAGNOSIS — N186 End stage renal disease: Secondary | ICD-10-CM | POA: Diagnosis not present

## 2016-12-05 DIAGNOSIS — Z992 Dependence on renal dialysis: Secondary | ICD-10-CM | POA: Diagnosis not present

## 2016-12-07 DIAGNOSIS — N186 End stage renal disease: Secondary | ICD-10-CM | POA: Diagnosis not present

## 2016-12-07 DIAGNOSIS — D509 Iron deficiency anemia, unspecified: Secondary | ICD-10-CM | POA: Diagnosis not present

## 2016-12-07 DIAGNOSIS — N2581 Secondary hyperparathyroidism of renal origin: Secondary | ICD-10-CM | POA: Diagnosis not present

## 2016-12-07 DIAGNOSIS — E1129 Type 2 diabetes mellitus with other diabetic kidney complication: Secondary | ICD-10-CM | POA: Diagnosis not present

## 2016-12-10 DIAGNOSIS — N2581 Secondary hyperparathyroidism of renal origin: Secondary | ICD-10-CM | POA: Diagnosis not present

## 2016-12-10 DIAGNOSIS — D509 Iron deficiency anemia, unspecified: Secondary | ICD-10-CM | POA: Diagnosis not present

## 2016-12-10 DIAGNOSIS — N186 End stage renal disease: Secondary | ICD-10-CM | POA: Diagnosis not present

## 2016-12-10 DIAGNOSIS — E1129 Type 2 diabetes mellitus with other diabetic kidney complication: Secondary | ICD-10-CM | POA: Diagnosis not present

## 2016-12-12 DIAGNOSIS — E1129 Type 2 diabetes mellitus with other diabetic kidney complication: Secondary | ICD-10-CM | POA: Diagnosis not present

## 2016-12-12 DIAGNOSIS — D509 Iron deficiency anemia, unspecified: Secondary | ICD-10-CM | POA: Diagnosis not present

## 2016-12-12 DIAGNOSIS — N2581 Secondary hyperparathyroidism of renal origin: Secondary | ICD-10-CM | POA: Diagnosis not present

## 2016-12-12 DIAGNOSIS — N186 End stage renal disease: Secondary | ICD-10-CM | POA: Diagnosis not present

## 2016-12-14 DIAGNOSIS — N186 End stage renal disease: Secondary | ICD-10-CM | POA: Diagnosis not present

## 2016-12-14 DIAGNOSIS — E1129 Type 2 diabetes mellitus with other diabetic kidney complication: Secondary | ICD-10-CM | POA: Diagnosis not present

## 2016-12-14 DIAGNOSIS — N2581 Secondary hyperparathyroidism of renal origin: Secondary | ICD-10-CM | POA: Diagnosis not present

## 2016-12-14 DIAGNOSIS — D509 Iron deficiency anemia, unspecified: Secondary | ICD-10-CM | POA: Diagnosis not present

## 2016-12-17 DIAGNOSIS — N2581 Secondary hyperparathyroidism of renal origin: Secondary | ICD-10-CM | POA: Diagnosis not present

## 2016-12-17 DIAGNOSIS — N186 End stage renal disease: Secondary | ICD-10-CM | POA: Diagnosis not present

## 2016-12-17 DIAGNOSIS — E1129 Type 2 diabetes mellitus with other diabetic kidney complication: Secondary | ICD-10-CM | POA: Diagnosis not present

## 2016-12-17 DIAGNOSIS — D509 Iron deficiency anemia, unspecified: Secondary | ICD-10-CM | POA: Diagnosis not present

## 2016-12-18 ENCOUNTER — Ambulatory Visit: Payer: Medicare Other | Admitting: Sports Medicine

## 2016-12-19 DIAGNOSIS — N2581 Secondary hyperparathyroidism of renal origin: Secondary | ICD-10-CM | POA: Diagnosis not present

## 2016-12-19 DIAGNOSIS — N186 End stage renal disease: Secondary | ICD-10-CM | POA: Diagnosis not present

## 2016-12-19 DIAGNOSIS — D509 Iron deficiency anemia, unspecified: Secondary | ICD-10-CM | POA: Diagnosis not present

## 2016-12-19 DIAGNOSIS — E1129 Type 2 diabetes mellitus with other diabetic kidney complication: Secondary | ICD-10-CM | POA: Diagnosis not present

## 2016-12-21 DIAGNOSIS — N186 End stage renal disease: Secondary | ICD-10-CM | POA: Diagnosis not present

## 2016-12-21 DIAGNOSIS — N2581 Secondary hyperparathyroidism of renal origin: Secondary | ICD-10-CM | POA: Diagnosis not present

## 2016-12-21 DIAGNOSIS — E1129 Type 2 diabetes mellitus with other diabetic kidney complication: Secondary | ICD-10-CM | POA: Diagnosis not present

## 2016-12-21 DIAGNOSIS — D509 Iron deficiency anemia, unspecified: Secondary | ICD-10-CM | POA: Diagnosis not present

## 2016-12-24 DIAGNOSIS — N2581 Secondary hyperparathyroidism of renal origin: Secondary | ICD-10-CM | POA: Diagnosis not present

## 2016-12-24 DIAGNOSIS — D509 Iron deficiency anemia, unspecified: Secondary | ICD-10-CM | POA: Diagnosis not present

## 2016-12-24 DIAGNOSIS — E1129 Type 2 diabetes mellitus with other diabetic kidney complication: Secondary | ICD-10-CM | POA: Diagnosis not present

## 2016-12-24 DIAGNOSIS — N186 End stage renal disease: Secondary | ICD-10-CM | POA: Diagnosis not present

## 2016-12-25 ENCOUNTER — Encounter: Payer: Self-pay | Admitting: Sports Medicine

## 2016-12-25 ENCOUNTER — Ambulatory Visit (INDEPENDENT_AMBULATORY_CARE_PROVIDER_SITE_OTHER): Payer: Medicare Other | Admitting: Sports Medicine

## 2016-12-25 ENCOUNTER — Telehealth: Payer: Self-pay | Admitting: *Deleted

## 2016-12-25 DIAGNOSIS — M79674 Pain in right toe(s): Secondary | ICD-10-CM

## 2016-12-25 DIAGNOSIS — L97511 Non-pressure chronic ulcer of other part of right foot limited to breakdown of skin: Secondary | ICD-10-CM | POA: Diagnosis not present

## 2016-12-25 DIAGNOSIS — E0840 Diabetes mellitus due to underlying condition with diabetic neuropathy, unspecified: Secondary | ICD-10-CM | POA: Diagnosis not present

## 2016-12-25 NOTE — Telephone Encounter (Addendum)
-----   Message from Landis Martins, [place] sent at 12/25/2016 12:45 PM EDT ----- Regarding: Iodosorb Please order Iodosorb for right 1st toe ulcer measures 0.5x0.2x0.1cm limited to skin. If too expensive have patient use betadine instead Thanks Dr. Cannon Kettle. Faxed orders to [facility].

## 2016-12-25 NOTE — Progress Notes (Signed)
Subjective: [name] [name] is a 61 y.o. male patient seen in office for evaluation of ulceration of the right great toe. Patient has a history of diabetes and a blood glucose level today of that is 120. Patient states that he has been putting antibiotic cream to area with new fissure to area and pain with walking. Denies nausea/fever/vomiting/chills/night sweats/shortness of breath/pain. Patient has no other pedal complaints at this time.  Patient Active Problem List   Diagnosis Date Noted  . Nausea with vomiting 04/02/2016  . Chest pain 03/23/2016  . Pulmonary edema 03/18/2016  . ESRD (end stage renal disease) ([place]) 03/18/2016  . Fluid overload 03/18/2016  . End-stage renal disease on hemodialysis ([place])   . Hypertensive heart disease with heart failure ([place])   . Mitral regurgitation   . Hyperkalemia 03/10/2016  . Acute pulmonary edema (HCC)   . Dyspnea 02/19/2016  . Acute on chronic diastolic CHF (congestive heart failure), NYHA class 1 ([place]) 01/02/2016  . ESRD (end stage renal disease) on dialysis ([place]) 01/01/2016  . Elevated troponin   . Emesis 12/23/2015  . Leukocytosis 12/23/2015  . Right sided weakness 10/28/2015  . Diabetic neuropathy ([place]) 07/29/2015  . Depression 07/22/2015  . GERD (gastroesophageal reflux disease) 06/04/2015  . Malnutrition of moderate degree ([place]) 05/17/2015  . Thrombocytopenia ([place]) 05/16/2015  . Weight loss 05/16/2015  . Angina of effort ([place]) 05/16/2015  . H/O TIA (transient ischemic attack) 04/01/2015  . Diastolic dysfunction-grade 2 12/13/2014  . Hypertension 04/27/2014  . CAD -S/P LAD BMS 2011, LAD DES 2012- patent cors Feb 2016 04/27/2014  . Diabetes mellitus type 2, diet-controlled ([place]) 04/27/2014  . Chest pain, atypical 04/26/2014   Current Outpatient Prescriptions on File Prior to Visit  Medication Sig Dispense Refill  . acetaminophen (TYLENOL) 325 MG tablet Take 2 tablets (650 mg total) by mouth every 6 (six) hours as needed for mild pain  (or Fever >/= 101). (Patient not taking: Reported on 09/23/2016)    . albuterol (PROVENTIL HFA;VENTOLIN HFA) 108 (90 Base) MCG/ACT inhaler Inhale 1-2 puffs into the lungs every 6 (six) hours as needed for wheezing or shortness of breath. 1 Inhaler 0  . amitriptyline (ELAVIL) 100 MG tablet Take 1 tablet (100 mg total) by mouth at bedtime. (Patient not taking: Reported on 09/23/2016) 30 tablet 2  . amLODipine (NORVASC) 10 MG tablet Take 1 tablet (10 mg total) by mouth daily. 30 tablet 3  . aspirin 81 MG chewable tablet Chew 1 tablet (81 mg total) by mouth daily.    [name] atorvastatin (LIPITOR) 20 MG tablet Take 20 mg by mouth at bedtime. Reported on 03/14/2016    . carvedilol (COREG) 25 MG tablet Take 1 tablet (25 mg total) by mouth 2 (two) times daily with a meal. 60 tablet 0  . cinacalcet (SENSIPAR) 90 MG tablet Take 90 mg by mouth at bedtime.     [name] esomeprazole (NEXIUM) 20 MG capsule Take 1 capsule (20 mg total) by mouth daily. (Patient taking differently: Take 20 mg by mouth daily with supper. ) 30 capsule 1  . hydrALAZINE (APRESOLINE) 25 MG tablet Take 1 tablet (25 mg total) by mouth 3 (three) times daily. 90 tablet 1  . HYDROcodone-acetaminophen (NORCO/VICODIN) 5-325 MG tablet Take 2 tablets by mouth every 4 (four) hours as needed for moderate pain. 10 tablet 0  . losartan (COZAAR) 100 MG tablet Take 100 mg by mouth at bedtime.  9  . multivitamin (RENA-VIT) TABS tablet Take 1 tablet by mouth at bedtime. [place]  tablet 0  . pregabalin (LYRICA) 50 MG capsule Take 1 capsule (50 mg total) by mouth daily. (Patient taking differently: Take 50 mg by mouth at bedtime. ) 30 capsule 0  . sevelamer carbonate (RENVELA) 800 MG tablet Take 4 tablets (3,200 mg total) by mouth 3 (three) times daily with meals. (Patient taking differently: Take 1,600-3,200 mg by mouth See admin instructions. Take 4 tablets (3200 mg) by mouth with meals and 2 tablets (1600 mg) with snacks) 320 tablet 0  . venlafaxine XR (EFFEXOR XR) 75 MG 24  hr capsule Take 1 capsule (75 mg total) by mouth daily with breakfast. 30 capsule 2   No current facility-administered medications on file prior to visit.    Allergies  Allergen Reactions  . Enalapril Hives  . Latex Rash  . Tape Rash    TAPE MAKES SKIN BREAK OUT AND TURN RED    No results found for this or any previous visit (from the past 2160 hour(s)).  Objective: There were no vitals filed for this visit.  General: Patient is awake, alert, oriented x 3 and in no acute distress.  Dermatology: Skin is warm and dry bilateral with a partial thickness ulceration present right plantar medial hallux with medial fissure. Ulceration measure 0.5 cm x 0.2 cm x 0.1 cm. There is a keratotic border with a granular base. The ulceration does not probe to bone. There is no malodor, no active drainage, no erythema, no edema. No acute signs of infection.   Vascular: Dorsalis Pedis pulse = 2/4 Bilateral,  Posterior Tibial pulse = 1/4 Bilateral,  Capillary Fill Time < 5 seconds  Neurologic: Protective sensation diminished bilateral.   Musculosketal: No Pain with palpation to ulcerated area. No pain with compression to calves bilateral. No gross bony deformities noted bilateral.  Assessment and Plan:  Problem List Items Addressed This Visit    None    Visit Diagnoses    Skin ulcer of right great toe, limited to breakdown of skin (HCC)    -  Primary   Great toe pain, right       Diabetes mellitus due to underlying condition with diabetic neuropathy, unspecified long term insulin use status ([place])         -Examined patient and discussed the progression of the wound and treatment alternatives. - Excisionally dedbrided ulcerations to healthy bleeding borders using a sterile chisel blade. -Applied iodosorb cream and dry sterile dressing and instructed patient to continue with daily dressings at home consisting of the same; Order placed for Iodosorb and advised patient that if not covered by insurance  to purchase betadine OTC to apply to wound at right 1st toe -Dispensed post op shoe to use daily until ulcer is healed - Advised patient to go to the ER or return to office if the wound worsens or if constitutional symptoms are present. -Patient to return to office in 2-3 weeks for follow up care and evaluation or sooner if problems arise.  Landis Martins, DPM

## 2016-12-26 DIAGNOSIS — N2581 Secondary hyperparathyroidism of renal origin: Secondary | ICD-10-CM | POA: Diagnosis not present

## 2016-12-26 DIAGNOSIS — N186 End stage renal disease: Secondary | ICD-10-CM | POA: Diagnosis not present

## 2016-12-26 DIAGNOSIS — D509 Iron deficiency anemia, unspecified: Secondary | ICD-10-CM | POA: Diagnosis not present

## 2016-12-26 DIAGNOSIS — E1129 Type 2 diabetes mellitus with other diabetic kidney complication: Secondary | ICD-10-CM | POA: Diagnosis not present

## 2016-12-28 DIAGNOSIS — D509 Iron deficiency anemia, unspecified: Secondary | ICD-10-CM | POA: Diagnosis not present

## 2016-12-28 DIAGNOSIS — E1129 Type 2 diabetes mellitus with other diabetic kidney complication: Secondary | ICD-10-CM | POA: Diagnosis not present

## 2016-12-28 DIAGNOSIS — N2581 Secondary hyperparathyroidism of renal origin: Secondary | ICD-10-CM | POA: Diagnosis not present

## 2016-12-28 DIAGNOSIS — N186 End stage renal disease: Secondary | ICD-10-CM | POA: Diagnosis not present

## 2016-12-31 DIAGNOSIS — N186 End stage renal disease: Secondary | ICD-10-CM | POA: Diagnosis not present

## 2016-12-31 DIAGNOSIS — N2581 Secondary hyperparathyroidism of renal origin: Secondary | ICD-10-CM | POA: Diagnosis not present

## 2016-12-31 DIAGNOSIS — D509 Iron deficiency anemia, unspecified: Secondary | ICD-10-CM | POA: Diagnosis not present

## 2016-12-31 DIAGNOSIS — E1129 Type 2 diabetes mellitus with other diabetic kidney complication: Secondary | ICD-10-CM | POA: Diagnosis not present

## 2017-01-01 DIAGNOSIS — N186 End stage renal disease: Secondary | ICD-10-CM | POA: Diagnosis not present

## 2017-01-01 DIAGNOSIS — I871 Compression of vein: Secondary | ICD-10-CM | POA: Diagnosis not present

## 2017-01-01 DIAGNOSIS — Z992 Dependence on renal dialysis: Secondary | ICD-10-CM | POA: Diagnosis not present

## 2017-01-01 DIAGNOSIS — T82858A Stenosis of vascular prosthetic devices, implants and grafts, initial encounter: Secondary | ICD-10-CM | POA: Diagnosis not present

## 2017-01-02 DIAGNOSIS — E1129 Type 2 diabetes mellitus with other diabetic kidney complication: Secondary | ICD-10-CM | POA: Diagnosis not present

## 2017-01-02 DIAGNOSIS — N186 End stage renal disease: Secondary | ICD-10-CM | POA: Diagnosis not present

## 2017-01-02 DIAGNOSIS — D509 Iron deficiency anemia, unspecified: Secondary | ICD-10-CM | POA: Diagnosis not present

## 2017-01-02 DIAGNOSIS — N2581 Secondary hyperparathyroidism of renal origin: Secondary | ICD-10-CM | POA: Diagnosis not present

## 2017-01-04 DIAGNOSIS — N186 End stage renal disease: Secondary | ICD-10-CM | POA: Diagnosis not present

## 2017-01-04 DIAGNOSIS — N2581 Secondary hyperparathyroidism of renal origin: Secondary | ICD-10-CM | POA: Diagnosis not present

## 2017-01-04 DIAGNOSIS — D509 Iron deficiency anemia, unspecified: Secondary | ICD-10-CM | POA: Diagnosis not present

## 2017-01-04 DIAGNOSIS — E1129 Type 2 diabetes mellitus with other diabetic kidney complication: Secondary | ICD-10-CM | POA: Diagnosis not present

## 2017-01-05 DIAGNOSIS — N186 End stage renal disease: Secondary | ICD-10-CM | POA: Diagnosis not present

## 2017-01-05 DIAGNOSIS — Z992 Dependence on renal dialysis: Secondary | ICD-10-CM | POA: Diagnosis not present

## 2017-01-05 DIAGNOSIS — E1122 Type 2 diabetes mellitus with diabetic chronic kidney disease: Secondary | ICD-10-CM | POA: Diagnosis not present

## 2017-01-07 DIAGNOSIS — N186 End stage renal disease: Secondary | ICD-10-CM | POA: Diagnosis not present

## 2017-01-07 DIAGNOSIS — N2581 Secondary hyperparathyroidism of renal origin: Secondary | ICD-10-CM | POA: Diagnosis not present

## 2017-01-07 DIAGNOSIS — D509 Iron deficiency anemia, unspecified: Secondary | ICD-10-CM | POA: Diagnosis not present

## 2017-01-07 DIAGNOSIS — E1129 Type 2 diabetes mellitus with other diabetic kidney complication: Secondary | ICD-10-CM | POA: Diagnosis not present

## 2017-01-09 DIAGNOSIS — N2581 Secondary hyperparathyroidism of renal origin: Secondary | ICD-10-CM | POA: Diagnosis not present

## 2017-01-09 DIAGNOSIS — E1129 Type 2 diabetes mellitus with other diabetic kidney complication: Secondary | ICD-10-CM | POA: Diagnosis not present

## 2017-01-09 DIAGNOSIS — N186 End stage renal disease: Secondary | ICD-10-CM | POA: Diagnosis not present

## 2017-01-09 DIAGNOSIS — D509 Iron deficiency anemia, unspecified: Secondary | ICD-10-CM | POA: Diagnosis not present

## 2017-01-11 DIAGNOSIS — N2581 Secondary hyperparathyroidism of renal origin: Secondary | ICD-10-CM | POA: Diagnosis not present

## 2017-01-11 DIAGNOSIS — D509 Iron deficiency anemia, unspecified: Secondary | ICD-10-CM | POA: Diagnosis not present

## 2017-01-11 DIAGNOSIS — N186 End stage renal disease: Secondary | ICD-10-CM | POA: Diagnosis not present

## 2017-01-11 DIAGNOSIS — E1129 Type 2 diabetes mellitus with other diabetic kidney complication: Secondary | ICD-10-CM | POA: Diagnosis not present

## 2017-01-14 DIAGNOSIS — D509 Iron deficiency anemia, unspecified: Secondary | ICD-10-CM | POA: Diagnosis not present

## 2017-01-14 DIAGNOSIS — E1129 Type 2 diabetes mellitus with other diabetic kidney complication: Secondary | ICD-10-CM | POA: Diagnosis not present

## 2017-01-14 DIAGNOSIS — N186 End stage renal disease: Secondary | ICD-10-CM | POA: Diagnosis not present

## 2017-01-14 DIAGNOSIS — N2581 Secondary hyperparathyroidism of renal origin: Secondary | ICD-10-CM | POA: Diagnosis not present

## 2017-01-15 ENCOUNTER — Ambulatory Visit: Payer: Medicare Other | Admitting: Sports Medicine

## 2017-01-16 DIAGNOSIS — N2581 Secondary hyperparathyroidism of renal origin: Secondary | ICD-10-CM | POA: Diagnosis not present

## 2017-01-16 DIAGNOSIS — D509 Iron deficiency anemia, unspecified: Secondary | ICD-10-CM | POA: Diagnosis not present

## 2017-01-16 DIAGNOSIS — E1129 Type 2 diabetes mellitus with other diabetic kidney complication: Secondary | ICD-10-CM | POA: Diagnosis not present

## 2017-01-16 DIAGNOSIS — N186 End stage renal disease: Secondary | ICD-10-CM | POA: Diagnosis not present

## 2017-01-18 DIAGNOSIS — D509 Iron deficiency anemia, unspecified: Secondary | ICD-10-CM | POA: Diagnosis not present

## 2017-01-18 DIAGNOSIS — N186 End stage renal disease: Secondary | ICD-10-CM | POA: Diagnosis not present

## 2017-01-18 DIAGNOSIS — E1129 Type 2 diabetes mellitus with other diabetic kidney complication: Secondary | ICD-10-CM | POA: Diagnosis not present

## 2017-01-18 DIAGNOSIS — N2581 Secondary hyperparathyroidism of renal origin: Secondary | ICD-10-CM | POA: Diagnosis not present

## 2017-01-21 DIAGNOSIS — N186 End stage renal disease: Secondary | ICD-10-CM | POA: Diagnosis not present

## 2017-01-21 DIAGNOSIS — D509 Iron deficiency anemia, unspecified: Secondary | ICD-10-CM | POA: Diagnosis not present

## 2017-01-21 DIAGNOSIS — N2581 Secondary hyperparathyroidism of renal origin: Secondary | ICD-10-CM | POA: Diagnosis not present

## 2017-01-21 DIAGNOSIS — E1129 Type 2 diabetes mellitus with other diabetic kidney complication: Secondary | ICD-10-CM | POA: Diagnosis not present

## 2017-01-23 DIAGNOSIS — N2581 Secondary hyperparathyroidism of renal origin: Secondary | ICD-10-CM | POA: Diagnosis not present

## 2017-01-23 DIAGNOSIS — N186 End stage renal disease: Secondary | ICD-10-CM | POA: Diagnosis not present

## 2017-01-23 DIAGNOSIS — E1129 Type 2 diabetes mellitus with other diabetic kidney complication: Secondary | ICD-10-CM | POA: Diagnosis not present

## 2017-01-23 DIAGNOSIS — D509 Iron deficiency anemia, unspecified: Secondary | ICD-10-CM | POA: Diagnosis not present

## 2017-01-25 DIAGNOSIS — E1129 Type 2 diabetes mellitus with other diabetic kidney complication: Secondary | ICD-10-CM | POA: Diagnosis not present

## 2017-01-25 DIAGNOSIS — N186 End stage renal disease: Secondary | ICD-10-CM | POA: Diagnosis not present

## 2017-01-25 DIAGNOSIS — D509 Iron deficiency anemia, unspecified: Secondary | ICD-10-CM | POA: Diagnosis not present

## 2017-01-25 DIAGNOSIS — N2581 Secondary hyperparathyroidism of renal origin: Secondary | ICD-10-CM | POA: Diagnosis not present

## 2017-01-28 DIAGNOSIS — E1129 Type 2 diabetes mellitus with other diabetic kidney complication: Secondary | ICD-10-CM | POA: Diagnosis not present

## 2017-01-28 DIAGNOSIS — N2581 Secondary hyperparathyroidism of renal origin: Secondary | ICD-10-CM | POA: Diagnosis not present

## 2017-01-28 DIAGNOSIS — N186 End stage renal disease: Secondary | ICD-10-CM | POA: Diagnosis not present

## 2017-01-28 DIAGNOSIS — D509 Iron deficiency anemia, unspecified: Secondary | ICD-10-CM | POA: Diagnosis not present

## 2017-01-30 DIAGNOSIS — N186 End stage renal disease: Secondary | ICD-10-CM | POA: Diagnosis not present

## 2017-01-30 DIAGNOSIS — N2581 Secondary hyperparathyroidism of renal origin: Secondary | ICD-10-CM | POA: Diagnosis not present

## 2017-01-30 DIAGNOSIS — E1129 Type 2 diabetes mellitus with other diabetic kidney complication: Secondary | ICD-10-CM | POA: Diagnosis not present

## 2017-01-30 DIAGNOSIS — D509 Iron deficiency anemia, unspecified: Secondary | ICD-10-CM | POA: Diagnosis not present

## 2017-02-01 DIAGNOSIS — E1129 Type 2 diabetes mellitus with other diabetic kidney complication: Secondary | ICD-10-CM | POA: Diagnosis not present

## 2017-02-01 DIAGNOSIS — N2581 Secondary hyperparathyroidism of renal origin: Secondary | ICD-10-CM | POA: Diagnosis not present

## 2017-02-01 DIAGNOSIS — D509 Iron deficiency anemia, unspecified: Secondary | ICD-10-CM | POA: Diagnosis not present

## 2017-02-01 DIAGNOSIS — N186 End stage renal disease: Secondary | ICD-10-CM | POA: Diagnosis not present

## 2017-02-04 DIAGNOSIS — N186 End stage renal disease: Secondary | ICD-10-CM | POA: Diagnosis not present

## 2017-02-04 DIAGNOSIS — E1129 Type 2 diabetes mellitus with other diabetic kidney complication: Secondary | ICD-10-CM | POA: Diagnosis not present

## 2017-02-04 DIAGNOSIS — D509 Iron deficiency anemia, unspecified: Secondary | ICD-10-CM | POA: Diagnosis not present

## 2017-02-04 DIAGNOSIS — N2581 Secondary hyperparathyroidism of renal origin: Secondary | ICD-10-CM | POA: Diagnosis not present

## 2017-02-04 DIAGNOSIS — E1122 Type 2 diabetes mellitus with diabetic chronic kidney disease: Secondary | ICD-10-CM | POA: Diagnosis not present

## 2017-02-04 DIAGNOSIS — Z992 Dependence on renal dialysis: Secondary | ICD-10-CM | POA: Diagnosis not present

## 2017-02-06 DIAGNOSIS — D631 Anemia in chronic kidney disease: Secondary | ICD-10-CM | POA: Diagnosis not present

## 2017-02-06 DIAGNOSIS — D509 Iron deficiency anemia, unspecified: Secondary | ICD-10-CM | POA: Diagnosis not present

## 2017-02-06 DIAGNOSIS — N2581 Secondary hyperparathyroidism of renal origin: Secondary | ICD-10-CM | POA: Diagnosis not present

## 2017-02-06 DIAGNOSIS — E1129 Type 2 diabetes mellitus with other diabetic kidney complication: Secondary | ICD-10-CM | POA: Diagnosis not present

## 2017-02-06 DIAGNOSIS — N186 End stage renal disease: Secondary | ICD-10-CM | POA: Diagnosis not present

## 2017-02-07 ENCOUNTER — Emergency Department (HOSPITAL_COMMUNITY): Payer: Medicare Other

## 2017-02-07 ENCOUNTER — Observation Stay (HOSPITAL_COMMUNITY)
Admission: EM | Admit: 2017-02-07 | Discharge: 2017-02-08 | Disposition: A | Discharge status: Home / Self Care | Payer: Medicare Other | Attending: Emergency Medicine | Admitting: Emergency Medicine

## 2017-02-07 ENCOUNTER — Encounter (HOSPITAL_COMMUNITY): Payer: Self-pay | Admitting: Emergency Medicine

## 2017-02-07 ENCOUNTER — Other Ambulatory Visit: Payer: Self-pay

## 2017-02-07 DIAGNOSIS — R0602 Shortness of breath: Secondary | ICD-10-CM

## 2017-02-07 DIAGNOSIS — Z8673 Personal history of transient ischemic attack (TIA), and cerebral infarction without residual deficits: Secondary | ICD-10-CM | POA: Diagnosis not present

## 2017-02-07 DIAGNOSIS — R05 Cough: Secondary | ICD-10-CM | POA: Diagnosis not present

## 2017-02-07 DIAGNOSIS — E1122 Type 2 diabetes mellitus with diabetic chronic kidney disease: Secondary | ICD-10-CM | POA: Insufficient documentation

## 2017-02-07 DIAGNOSIS — I10 Essential (primary) hypertension: Secondary | ICD-10-CM | POA: Diagnosis present

## 2017-02-07 DIAGNOSIS — Z91040 Latex allergy status: Secondary | ICD-10-CM | POA: Insufficient documentation

## 2017-02-07 DIAGNOSIS — Z7982 Long term (current) use of aspirin: Secondary | ICD-10-CM | POA: Diagnosis not present

## 2017-02-07 DIAGNOSIS — Z79899 Other long term (current) drug therapy: Secondary | ICD-10-CM | POA: Diagnosis not present

## 2017-02-07 DIAGNOSIS — Z992 Dependence on renal dialysis: Secondary | ICD-10-CM | POA: Diagnosis not present

## 2017-02-07 DIAGNOSIS — N186 End stage renal disease: Secondary | ICD-10-CM | POA: Diagnosis not present

## 2017-02-07 DIAGNOSIS — I5033 Acute on chronic diastolic (congestive) heart failure: Secondary | ICD-10-CM | POA: Diagnosis not present

## 2017-02-07 DIAGNOSIS — J810 Acute pulmonary edema: Secondary | ICD-10-CM | POA: Diagnosis present

## 2017-02-07 DIAGNOSIS — F17210 Nicotine dependence, cigarettes, uncomplicated: Secondary | ICD-10-CM | POA: Diagnosis not present

## 2017-02-07 DIAGNOSIS — Z8639 Personal history of other endocrine, nutritional and metabolic disease: Secondary | ICD-10-CM

## 2017-02-07 DIAGNOSIS — J9601 Acute respiratory failure with hypoxia: Principal | ICD-10-CM | POA: Diagnosis present

## 2017-02-07 DIAGNOSIS — I2510 Atherosclerotic heart disease of native coronary artery without angina pectoris: Secondary | ICD-10-CM | POA: Insufficient documentation

## 2017-02-07 DIAGNOSIS — I132 Hypertensive heart and chronic kidney disease with heart failure and with stage 5 chronic kidney disease, or end stage renal disease: Secondary | ICD-10-CM | POA: Diagnosis not present

## 2017-02-07 DIAGNOSIS — R079 Chest pain, unspecified: Secondary | ICD-10-CM | POA: Diagnosis not present

## 2017-02-07 LAB — CBC WITH DIFFERENTIAL/PLATELET
BASOS PCT: 0 %
Basophils Absolute: 0 10*3/uL (ref 0.0–0.1)
Eosinophils Absolute: 0.3 10*3/uL (ref 0.0–0.7)
Eosinophils Relative: 3 %
HEMATOCRIT: 30.9 % — AB (ref 39.0–52.0)
Hemoglobin: 9.6 g/dL — ABNORMAL LOW (ref 13.0–17.0)
LYMPHS ABS: 0.7 10*3/uL (ref 0.7–4.0)
Lymphocytes Relative: 9 %
MCH: 26.3 pg (ref 26.0–34.0)
MCHC: 31.1 g/dL (ref 30.0–36.0)
MCV: 84.7 fL (ref 78.0–100.0)
MONO ABS: 0.6 10*3/uL (ref 0.1–1.0)
Monocytes Relative: 7 %
NEUTROS ABS: 6.8 10*3/uL (ref 1.7–7.7)
Neutrophils Relative %: 81 %
PLATELETS: 161 10*3/uL (ref 150–400)
RBC: 3.65 MIL/uL — ABNORMAL LOW (ref 4.22–5.81)
RDW: 16.1 % — ABNORMAL HIGH (ref 11.5–15.5)
WBC: 8.4 10*3/uL (ref 4.0–10.5)

## 2017-02-07 LAB — COMPREHENSIVE METABOLIC PANEL
ALK PHOS: 58 U/L (ref 38–126)
ALT: 14 U/L — AB (ref 17–63)
ANION GAP: 14 (ref 5–15)
AST: 13 U/L — ABNORMAL LOW (ref 15–41)
Albumin: 3.5 g/dL (ref 3.5–5.0)
BUN: 34 mg/dL — ABNORMAL HIGH (ref 6–20)
CALCIUM: 9.2 mg/dL (ref 8.9–10.3)
CO2: 30 mmol/L (ref 22–32)
CREATININE: 8.64 mg/dL — AB (ref 0.61–1.24)
Chloride: 94 mmol/L — ABNORMAL LOW (ref 101–111)
GFR calc non Af Amer: 6 mL/min — ABNORMAL LOW (ref >60)
GFR, EST AFRICAN AMERICAN: 7 mL/min — AB (ref >60)
Glucose, Bld: 104 mg/dL — ABNORMAL HIGH (ref 65–99)
Potassium: 4.3 mmol/L (ref 3.5–5.1)
Sodium: 138 mmol/L (ref 135–145)
Total Bilirubin: 1.2 mg/dL (ref 0.3–1.2)
Total Protein: 8.4 g/dL — ABNORMAL HIGH (ref 6.5–8.1)

## 2017-02-07 LAB — I-STAT CHEM 8, ED
BUN: 36 mg/dL — ABNORMAL HIGH (ref 6–20)
CALCIUM ION: 1 mmol/L — AB (ref 1.15–1.40)
CHLORIDE: 97 mmol/L — AB (ref 101–111)
Creatinine, Ser: 8.8 mg/dL — ABNORMAL HIGH (ref 0.61–1.24)
GLUCOSE: 96 mg/dL (ref 65–99)
HCT: 32 % — ABNORMAL LOW (ref 39.0–52.0)
Hemoglobin: 10.9 g/dL — ABNORMAL LOW (ref 13.0–17.0)
Potassium: 4.4 mmol/L (ref 3.5–5.1)
Sodium: 140 mmol/L (ref 135–145)
TCO2: 32 mmol/L (ref 0–100)

## 2017-02-07 LAB — BRAIN NATRIURETIC PEPTIDE: B NATRIURETIC PEPTIDE 5: 1116.8 pg/mL — AB (ref 0.0–100.0)

## 2017-02-07 LAB — I-STAT TROPONIN, ED: TROPONIN I, POC: 0.02 ng/mL (ref 0.00–0.08)

## 2017-02-07 MED ORDER — IPRATROPIUM-ALBUTEROL 0.5-2.5 (3) MG/3ML IN SOLN
3.0000 mL | Freq: Once | RESPIRATORY_TRACT | Status: AC
  Administered 2017-02-07: 3 mL via RESPIRATORY_TRACT
  Filled 2017-02-07: qty 3

## 2017-02-07 MED ORDER — CALCIUM GLUCONATE 10 % IV SOLN
1.0000 g | Freq: Once | INTRAVENOUS | Status: DC
  Filled 2017-02-07: qty 10

## 2017-02-07 NOTE — ED Provider Notes (Signed)
[facility] DEPT Provider Note   CSN: [id number] Arrival date & time: 02/07/17  2132     History   Chief Complaint Chief Complaint  Patient presents with  . Chest Pain    HPI [name] [name] is a 61 y.o. male.  HPI   [name] [name] is a 61 y.o. male, with a history of CAD, ESRD on dialysis, diastolic CHF, presenting to the ED with Chest pain and shortness of breath beginning yesterday during his dialysis treatment. Patient successfully completed his treatment. Also endorses a productive cough with green sputum and orthopnea. States that lying supine increases his coughing which therefore causes pain in the chest. Chest pain is central, sharp, present with cough.  Denies fever/chills, peripheral edema, N/V/D, abdominal pain, or any other complaints.   Patient's last echocardiogram was performed June 2017 with EF 45-50%. Moderate to severe tricuspid regurgitation.     Past Medical History:  Diagnosis Date  . Anemia   . Anginal pain ([place])   . CAD (coronary artery disease)    a. per [facility] s/p 3.22mm x 30mm Vision BMS to mid LAD 12/2009 and Xience DES to mid LAD 10/2010.  [name] Chronic diastolic CHF (congestive heart failure) ([place])   . Colon polyps   . Daily headache   . ESRD on dialysis [facility]) since ~ 2008   "[place]; TTS" (07/21/2015)  . GERD (gastroesophageal reflux disease)   . Heart murmur   . Hematochezia    a. 2014: colonscopy, which showed moderately-sized internal hemorrhoids, two 70mm polyps in transverse colon and ascending colon that were resected, five 2-29mm polyps in sigmoid colon, descending colon, transverse colon, and ascending colon that were resected. An upper endoscopy was performed and showed normal esophagus, stomach, and duodenum.  . Hematuria    a. H/o hematuria 2014 with cystoscopy that was unrevealing for his source of hematuria. He underwent a kidney ultrasound on 10/14 that showed mildly echogenic and scarred kidneys compatible with medical renal  disease, without hydronephrosis or renal calculi.  [name] History of blood transfusion    "had colonoscopy done; they had to give me some blood"  . Hyperlipidemia   . Hypertension   . On home oxygen therapy    "2L prn" (07/21/2015)  . Renal insufficiency   . 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 Type II diabetes mellitus [facility])     Patient Active Problem List   Diagnosis Date Noted  . Nausea with vomiting 04/02/2016  . Chest pain 03/23/2016  . Pulmonary edema 03/18/2016  . ESRD (end stage renal disease) ([place]) 03/18/2016  . Fluid overload 03/18/2016  . End-stage renal disease on hemodialysis ([place])   . Hypertensive heart disease with heart failure ([place])   . Mitral regurgitation   . Hyperkalemia 03/10/2016  . Acute pulmonary edema (HCC)   . Dyspnea 02/19/2016  . Acute on chronic diastolic CHF (congestive heart failure), NYHA class 1 ([place]) 01/02/2016  . ESRD (end stage renal disease) on dialysis ([place]) 01/01/2016  . Elevated troponin   . Emesis 12/23/2015  . Leukocytosis 12/23/2015  . Right sided weakness 10/28/2015  . Diabetic neuropathy ([place]) 07/29/2015  . Depression 07/22/2015  . GERD (gastroesophageal reflux disease) 06/04/2015  . Malnutrition of moderate degree ([place]) 05/17/2015  . Thrombocytopenia ([place]) 05/16/2015  . Weight loss 05/16/2015  . Angina of effort ([place]) 05/16/2015  . H/O TIA (transient ischemic attack) 04/01/2015  . Diastolic dysfunction-grade 2 12/13/2014  . Hypertension 04/27/2014  .  CAD -S/P LAD BMS 2011, LAD DES 2012- patent cors Feb 2016 04/27/2014  . Diabetes mellitus type 2, diet-controlled ([place]) 04/27/2014  . Chest pain, atypical 04/26/2014    Past Surgical History:  Procedure Laterality Date  . AV FISTULA PLACEMENT Left ~ 2007   "upper arm"  . CARDIAC CATHETERIZATION  "several"  . CORONARY ANGIOPLASTY WITH STENT PLACEMENT  "several"  . CYSTOSCOPY W/ STONE MANIPULATION  X2?  . EYE SURGERY Bilateral    "laser OR for  hemorrhage"  . LEFT HEART CATHETERIZATION WITH CORONARY ANGIOGRAM N/A 11/23/2014   Procedure: LEFT HEART CATHETERIZATION WITH CORONARY ANGIOGRAM;  Surgeon: Troy Sine, MD;  Location[facility] CATH LAB;  Service: Cardiovascular;  Laterality: N/A;  . LITHOTRIPSY  X1       Home Medications    Prior to Admission medications   Medication Sig Start Date End Date Taking? Authorizing Provider  albuterol (PROVENTIL HFA;VENTOLIN HFA) 108 (90 Base) MCG/ACT inhaler Inhale 1-2 puffs into the lungs every 6 (six) hours as needed for wheezing or shortness of breath. 01/19/16  Yes Delsa Grana, PA-C  amLODipine (NORVASC) 10 MG tablet Take 1 tablet (10 mg total) by mouth daily. 10/30/15  Yes Ripudeep Krystal Eaton, MD  aspirin 81 MG chewable tablet Chew 1 tablet (81 mg total) by mouth daily. 07/22/15  Yes Iline Oven, MD  atorvastatin (LIPITOR) 20 MG tablet Take 20 mg by mouth at bedtime. Reported on 03/14/2016   Yes Historical Provider, MD  carvedilol (COREG) 25 MG tablet Take 1 tablet (25 mg total) by mouth 2 (two) times daily with a meal. 03/12/16  Yes Geradine Girt, DO  cinacalcet (SENSIPAR) 90 MG tablet Take 90 mg by mouth at bedtime.    Yes Historical Provider, MD  multivitamin (RENA-VIT) TABS tablet Take 1 tablet by mouth at bedtime. 12/15/14  Yes Geradine Girt, DO  pregabalin (LYRICA) 50 MG capsule Take 1 capsule (50 mg total) by mouth daily. Patient taking differently: Take 50 mg by mouth at bedtime.  12/15/14  Yes Geradine Girt, DO  PRESCRIPTION MEDICATION Take 2-4 tablets by mouth See admin instructions. Take 4 tablets with meals and take 2 tablets with snacks   Gets Mail Ordered but doesn't know the name   Yes Historical Provider, MD  acetaminophen (TYLENOL) 325 MG tablet Take 2 tablets (650 mg total) by mouth every 6 (six) hours as needed for mild pain (or Fever >/= 101). Patient not taking: Reported on 09/23/2016 03/27/16   Kinnie Feil, MD  amitriptyline (ELAVIL) 100 MG tablet Take 1 tablet (100 mg  total) by mouth at bedtime. Patient not taking: Reported on 09/23/2016 07/27/15   Shela Leff, MD  esomeprazole (NEXIUM) 20 MG capsule Take 1 capsule (20 mg total) by mouth daily. Patient not taking: Reported on 02/07/2017 04/02/16 04/02/17  Maryellen Pile, MD  hydrALAZINE (APRESOLINE) 25 MG tablet Take 1 tablet (25 mg total) by mouth 3 (three) times daily. Patient not taking: Reported on 02/07/2017 03/18/16   Reyne Dumas, MD  HYDROcodone-acetaminophen (NORCO/VICODIN) 5-325 MG tablet Take 2 tablets by mouth every 4 (four) hours as needed for moderate pain. Patient not taking: Reported on 02/07/2017 09/23/16   Orpah Greek, MD  sevelamer carbonate (RENVELA) 800 MG tablet Take 4 tablets (3,200 mg total) by mouth 3 (three) times daily with meals. Patient not taking: Reported on 02/07/2017 03/12/16   Geradine Girt, DO  venlafaxine XR (EFFEXOR XR) 75 MG 24 hr capsule Take 1 capsule (75 mg total)  by mouth daily with breakfast. Patient not taking: Reported on 02/07/2017 07/27/15   Shela Leff, MD    Family History Family History  Problem Relation Age of Onset  . Bone cancer Mother   . Anuerysm Father   . Hypertension    . Diabetes type II Daughter     Social History Social History  Substance Use Topics  . Smoking status: Current Every Day Smoker    Packs/day: 0.50    Years: 8.00    Types: Cigarettes    Last attempt to quit: 12/06/2010  . Smokeless tobacco: Never Used  . Alcohol use No     Allergies   Enalapril; Latex; and Tape   Review of Systems Review of Systems  Constitutional: Negative for chills, diaphoresis and fever.  Respiratory: Positive for cough and shortness of breath.   Cardiovascular: Positive for chest pain.  Gastrointestinal: Negative for abdominal pain, diarrhea, nausea and vomiting.  Musculoskeletal: Negative for back pain.  All other systems reviewed and are negative.    Physical Exam Updated Vital Signs BP (!) 148/90   Pulse 85   Temp 98.8 F  (37.1 C) (Oral)   Resp (!) 22   Ht 6' (1.829 m)   Wt 93.9 kg   SpO2 97%   BMI 28.07 kg/m   Physical Exam  Constitutional: He appears well-developed and well-nourished. No distress.  HENT:  Head: Normocephalic and atraumatic.  Eyes: Conjunctivae are normal.  Neck: Neck supple.  Cardiovascular: Normal rate, regular rhythm, normal heart sounds and intact distal pulses.   Pulmonary/Chest: Effort normal. No respiratory distress. He has decreased breath sounds (left). He has wheezes (global, expiratory).  Abdominal: Soft. There is no tenderness. There is no guarding.  Musculoskeletal: He exhibits no edema.  Lymphadenopathy:    He has no cervical adenopathy.  Neurological: He is alert.  Skin: Skin is warm and dry. He is not diaphoretic.  Psychiatric: He has a normal mood and affect. His behavior is normal.  Nursing note and vitals reviewed.    ED Treatments / Results  Labs (all labs ordered are listed, but only abnormal results are displayed) Labs Reviewed  CBC WITH DIFFERENTIAL/PLATELET - Abnormal; Notable for the following:       Result Value   RBC 3.65 (*)    Hemoglobin 9.6 (*)    HCT 30.9 (*)    RDW 16.1 (*)    All other components within normal limits  COMPREHENSIVE METABOLIC PANEL - Abnormal; Notable for the following:    Chloride 94 (*)    Glucose, Bld 104 (*)    BUN 34 (*)    Creatinine, Ser 8.64 (*)    Total Protein 8.4 (*)    AST 13 (*)    ALT 14 (*)    GFR calc non Af Amer 6 (*)    GFR calc Af Amer 7 (*)    All other components within normal limits  BRAIN NATRIURETIC PEPTIDE - Abnormal; Notable for the following:    B Natriuretic Peptide 1,116.8 (*)    All other components within normal limits  I-STAT CHEM 8, ED - Abnormal; Notable for the following:    Chloride 97 (*)    BUN 36 (*)    Creatinine, Ser 8.80 (*)    Calcium, Ion 1.00 (*)    Hemoglobin 10.9 (*)    HCT 32.0 (*)    All other components within normal limits  I-STAT TROPOININ, ED    Hemoglobin  Date Value Ref Range Status  02/07/2017 10.9 (L) 13.0 - 17.0 g/dL Final  02/07/2017 9.6 (L) 13.0 - 17.0 g/dL Final  09/23/2016 13.7 13.0 - 17.0 g/dL Final  08/29/2016 11.6 (L) 13.0 - 17.0 g/dL Final    EKG  EKG Interpretation  Date/Time:  Thursday Feb 07 2017 23:44:50 EDT Ventricular Rate:  82 PR Interval:    QRS Duration: 99 QT Interval:  414 QTC Calculation: 484 R Axis:   79 Text Interpretation:  Sinus rhythm Borderline prolonged QT interval Interpretation limited secondary to artifact Confirmed by Ripley Fraise [phone]) on 02/08/2017 12:06:04 AM       Radiology Dg Chest 2 View  Result Date: 02/07/2017 CLINICAL DATA:  Central chest pain with cough EXAM: CHEST  2 VIEW COMPARISON:  09/23/2016 FINDINGS: Lungs are slightly hyperinflated. Mild elevation of left diaphragm as before. Diffuse bilateral interstitial opacity. No focal consolidation or effusion. Stable borderline cardiomegaly with atherosclerosis. No pneumothorax. IMPRESSION: 1. Diffuse bilateral interstitial opacities may reflect mild edema or interstitial inflammatory infiltrates. 2. There is no focal consolidation 3. Stable borderline cardiomegaly with mild central congestion Electronically Signed   By: Donavan Foil M.D.   On: 02/07/2017 22:23    Procedures Procedures (including critical care time)  Medications Ordered in ED Medications  ceFEPIme (MAXIPIME) 1 g in dextrose 5 % 50 mL IVPB (1 g Intravenous New Bag/Given 02/08/17 0048)  vancomycin (VANCOCIN) 2,000 mg in sodium chloride 0.9 % 500 mL IVPB (not administered)  ipratropium-albuterol (DUONEB) 0.5-2.5 (3) MG/3ML nebulizer solution 3 mL (3 mLs Nebulization Given 02/07/17 2341)  nitroGLYCERIN (NITROGLYN) 2 % ointment 1 inch (1 inch Topical Given 02/08/17 0024)     Initial Impression / Assessment and Plan / ED Course  I have reviewed the triage vital signs and the nursing notes.  Pertinent labs & imaging results that were available during my care  of the patient were reviewed by me and considered in my medical decision making (see chart for details).  Clinical Course as of Feb 08 53  Fri Feb 08, 2017  1308 Spoke with Charlott Rakes, IM resident, who agrees to come see and admit the patient.  [SJ]    Clinical Course User Index [SJ] Lorayne Bender, PA-C       Patient presents with shortness breath and chest pain along with a productive cough. HCAP pneumonia versus pulmonary edema. Admission for new oxygen requirement.    Vitals:   02/07/17 2315 02/07/17 2330 02/08/17 0000 02/08/17 0015  BP: (!) 143/82 136/84 133/81 131/79  Pulse: 82 82 81 78  Resp: (!) 21 (!) 28 (!) 24 19  Temp:      TempSrc:      SpO2: 95% 97% 94% 100%  Weight:      Height:         Final Clinical Impressions(s) / ED Diagnoses   Final diagnoses:  Shortness of breath    New Prescriptions New Prescriptions   No medications on file     Layla Maw 02/08/17 0054    Ripley Fraise, MD 02/08/17 [phone]

## 2017-02-07 NOTE — ED Notes (Addendum)
Pt reports being at dialysis yesterday, 02/06/17,  when he started to have chest pains and SOB. Pt reports having mucus in his lungs and taking robitussin DM. Pt reports only 2-3 hours of sleep. Pt reports the pain is still the same. Pt reports he is unable to lay down. Pt reports he did his full dialysis treatment.

## 2017-02-07 NOTE — ED Triage Notes (Signed)
:  Pt to ED with c/o central chest pain onset this am with shortness of breath.  Pt is dialysis pt and last dialysis was yesterday.  Pt st's unable to lay down due to shortness of breath

## 2017-02-08 ENCOUNTER — Encounter (HOSPITAL_COMMUNITY): Payer: Self-pay | Admitting: Emergency Medicine

## 2017-02-08 DIAGNOSIS — Z91048 Other nonmedicinal substance allergy status: Secondary | ICD-10-CM

## 2017-02-08 DIAGNOSIS — E1122 Type 2 diabetes mellitus with diabetic chronic kidney disease: Secondary | ICD-10-CM

## 2017-02-08 DIAGNOSIS — Z992 Dependence on renal dialysis: Secondary | ICD-10-CM | POA: Diagnosis not present

## 2017-02-08 DIAGNOSIS — Z955 Presence of coronary angioplasty implant and graft: Secondary | ICD-10-CM | POA: Diagnosis not present

## 2017-02-08 DIAGNOSIS — Z91040 Latex allergy status: Secondary | ICD-10-CM

## 2017-02-08 DIAGNOSIS — Z888 Allergy status to other drugs, medicaments and biological substances status: Secondary | ICD-10-CM | POA: Diagnosis not present

## 2017-02-08 DIAGNOSIS — J810 Acute pulmonary edema: Secondary | ICD-10-CM

## 2017-02-08 DIAGNOSIS — J9601 Acute respiratory failure with hypoxia: Secondary | ICD-10-CM

## 2017-02-08 DIAGNOSIS — Z7982 Long term (current) use of aspirin: Secondary | ICD-10-CM

## 2017-02-08 DIAGNOSIS — N186 End stage renal disease: Secondary | ICD-10-CM

## 2017-02-08 DIAGNOSIS — Z79899 Other long term (current) drug therapy: Secondary | ICD-10-CM

## 2017-02-08 DIAGNOSIS — I2510 Atherosclerotic heart disease of native coronary artery without angina pectoris: Secondary | ICD-10-CM

## 2017-02-08 DIAGNOSIS — I120 Hypertensive chronic kidney disease with stage 5 chronic kidney disease or end stage renal disease: Secondary | ICD-10-CM

## 2017-02-08 LAB — INFLUENZA PANEL BY PCR (TYPE A & B)
Influenza A By PCR: NEGATIVE
Influenza B By PCR: NEGATIVE

## 2017-02-08 LAB — GLUCOSE, CAPILLARY
GLUCOSE-CAPILLARY: 121 mg/dL — AB (ref 65–99)
Glucose-Capillary: 83 mg/dL (ref 65–99)
Glucose-Capillary: 98 mg/dL (ref 65–99)

## 2017-02-08 LAB — CBC
HEMATOCRIT: 27.2 % — AB (ref 39.0–52.0)
HEMOGLOBIN: 8.8 g/dL — AB (ref 13.0–17.0)
MCH: 27.6 pg (ref 26.0–34.0)
MCHC: 32.4 g/dL (ref 30.0–36.0)
MCV: 85.3 fL (ref 78.0–100.0)
Platelets: 121 10*3/uL — ABNORMAL LOW (ref 150–400)
RBC: 3.19 MIL/uL — ABNORMAL LOW (ref 4.22–5.81)
RDW: 16.3 % — ABNORMAL HIGH (ref 11.5–15.5)
WBC: 6.7 10*3/uL (ref 4.0–10.5)

## 2017-02-08 LAB — BASIC METABOLIC PANEL
ANION GAP: 16 — AB (ref 5–15)
BUN: 40 mg/dL — ABNORMAL HIGH (ref 6–20)
CO2: 27 mmol/L (ref 22–32)
Calcium: 8.5 mg/dL — ABNORMAL LOW (ref 8.9–10.3)
Chloride: 97 mmol/L — ABNORMAL LOW (ref 101–111)
Creatinine, Ser: 9.03 mg/dL — ABNORMAL HIGH (ref 0.61–1.24)
GFR calc Af Amer: 7 mL/min — ABNORMAL LOW (ref >60)
GFR, EST NON AFRICAN AMERICAN: 6 mL/min — AB (ref >60)
GLUCOSE: 104 mg/dL — AB (ref 65–99)
POTASSIUM: 4.8 mmol/L (ref 3.5–5.1)
SODIUM: 140 mmol/L (ref 135–145)

## 2017-02-08 LAB — HIV ANTIBODY (ROUTINE TESTING W REFLEX): HIV SCREEN 4TH GENERATION: NONREACTIVE

## 2017-02-08 LAB — MRSA PCR SCREENING: MRSA by PCR: NEGATIVE

## 2017-02-08 LAB — PROCALCITONIN: Procalcitonin: 1.05 ng/mL

## 2017-02-08 MED ORDER — LIDOCAINE-PRILOCAINE 2.5-2.5 % EX CREA: 1.0000 "application " | TOPICAL_CREAM | CUTANEOUS | Status: DC | PRN

## 2017-02-08 MED ORDER — SENNOSIDES-DOCUSATE SODIUM 8.6-50 MG PO TABS: 1.0000 | ORAL_TABLET | Freq: Every evening | ORAL | Status: DC | PRN

## 2017-02-08 MED ORDER — DEXTROSE 5 % IV SOLN
500.0000 mg | Freq: Every day | INTRAVENOUS | Status: DC
  Filled 2017-02-08: qty 500

## 2017-02-08 MED ORDER — IPRATROPIUM-ALBUTEROL 0.5-2.5 (3) MG/3ML IN SOLN
3.0000 mL | Freq: Four times a day (QID) | RESPIRATORY_TRACT | Status: DC
  Administered 2017-02-08 (×2): 3 mL via RESPIRATORY_TRACT
  Filled 2017-02-08 (×3): qty 3

## 2017-02-08 MED ORDER — DARBEPOETIN ALFA 150 MCG/0.3ML IJ SOSY
PREFILLED_SYRINGE | INTRAMUSCULAR | Status: AC
Start: 2017-02-08 — End: 2017-02-08
  Administered 2017-02-08: 150 ug via INTRAVENOUS
  Filled 2017-02-08: qty 0.3

## 2017-02-08 MED ORDER — NITROGLYCERIN 2 % TD OINT
1.0000 [in_us] | TOPICAL_OINTMENT | Freq: Once | TRANSDERMAL | Status: AC
  Administered 2017-02-08: 1 [in_us] via TOPICAL
  Filled 2017-02-08: qty 1

## 2017-02-08 MED ORDER — SODIUM CHLORIDE 0.9 % IV SOLN: 100.0000 mL | INTRAVENOUS | Status: DC | PRN

## 2017-02-08 MED ORDER — LIDOCAINE HCL (PF) 1 % IJ SOLN: 5.0000 mL | INTRAMUSCULAR | Status: DC | PRN

## 2017-02-08 MED ORDER — AZITHROMYCIN 500 MG PO TABS: 250.0000 mg | ORAL_TABLET | Freq: Every day | ORAL | Status: DC

## 2017-02-08 MED ORDER — HEPARIN SODIUM (PORCINE) 1000 UNIT/ML DIALYSIS: 20.0000 [IU]/kg | INTRAMUSCULAR | Status: DC | PRN

## 2017-02-08 MED ORDER — DEXTROSE 5 % IV SOLN: 500.0000 mg | INTRAVENOUS | Status: DC

## 2017-02-08 MED ORDER — VANCOMYCIN HCL 10 G IV SOLR
2000.0000 mg | Freq: Once | INTRAVENOUS | Status: DC
  Administered 2017-02-08: 2000 mg via INTRAVENOUS
  Filled 2017-02-08: qty 2000

## 2017-02-08 MED ORDER — IPRATROPIUM-ALBUTEROL 0.5-2.5 (3) MG/3ML IN SOLN
RESPIRATORY_TRACT | Status: AC
  Filled 2017-02-08: qty 3

## 2017-02-08 MED ORDER — HEPARIN SODIUM (PORCINE) 5000 UNIT/ML IJ SOLN
5000.0000 [IU] | Freq: Three times a day (TID) | INTRAMUSCULAR | Status: DC
  Administered 2017-02-08: 5000 [IU] via SUBCUTANEOUS
  Filled 2017-02-08: qty 1

## 2017-02-08 MED ORDER — ACETAMINOPHEN 650 MG RE SUPP: 650.0000 mg | Freq: Four times a day (QID) | RECTAL | Status: DC | PRN

## 2017-02-08 MED ORDER — SEVELAMER CARBONATE 800 MG PO TABS
3200.0000 mg | ORAL_TABLET | Freq: Three times a day (TID) | ORAL | Status: DC
  Administered 2017-02-08: 3200 mg via ORAL
  Filled 2017-02-08: qty 4

## 2017-02-08 MED ORDER — AMLODIPINE BESYLATE 5 MG PO TABS
5.0000 mg | ORAL_TABLET | Freq: Every day | ORAL | Status: DC
  Administered 2017-02-08: 5 mg via ORAL
  Filled 2017-02-08: qty 1

## 2017-02-08 MED ORDER — DEXTROSE 5 % IV SOLN
1.0000 g | Freq: Once | INTRAVENOUS | Status: AC
  Administered 2017-02-08: 1 g via INTRAVENOUS
  Filled 2017-02-08: qty 1

## 2017-02-08 MED ORDER — CARVEDILOL 25 MG PO TABS: 25.0000 mg | ORAL_TABLET | Freq: Two times a day (BID) | ORAL | 2 refills | Status: DC

## 2017-02-08 MED ORDER — CALCITRIOL 0.5 MCG PO CAPS
1.0000 ug | ORAL_CAPSULE | ORAL | Status: DC
  Administered 2017-02-08: 1 ug via ORAL
  Filled 2017-02-08: qty 2

## 2017-02-08 MED ORDER — GUAIFENESIN ER 600 MG PO TB12
1200.0000 mg | ORAL_TABLET | Freq: Two times a day (BID) | ORAL | Status: DC
  Administered 2017-02-08 (×2): 1200 mg via ORAL
  Filled 2017-02-08 (×2): qty 2

## 2017-02-08 MED ORDER — AMLODIPINE BESYLATE 5 MG PO TABS: 5.0000 mg | ORAL_TABLET | Freq: Every day | ORAL | 1 refills | Status: DC

## 2017-02-08 MED ORDER — PENTAFLUOROPROP-TETRAFLUOROETH EX AERO: 1.0000 "application " | INHALATION_SPRAY | CUTANEOUS | Status: DC | PRN

## 2017-02-08 MED ORDER — HEPARIN SODIUM (PORCINE) 1000 UNIT/ML DIALYSIS: 1000.0000 [IU] | INTRAMUSCULAR | Status: DC | PRN

## 2017-02-08 MED ORDER — DARBEPOETIN ALFA 150 MCG/0.3ML IJ SOSY
150.0000 ug | PREFILLED_SYRINGE | INTRAMUSCULAR | Status: DC
  Administered 2017-02-08: 150 ug via INTRAVENOUS

## 2017-02-08 MED ORDER — ACETAMINOPHEN 325 MG PO TABS: 650.0000 mg | ORAL_TABLET | Freq: Four times a day (QID) | ORAL | Status: DC | PRN

## 2017-02-08 MED ORDER — AMLODIPINE BESYLATE 10 MG PO TABS: 10.0000 mg | ORAL_TABLET | Freq: Every day | ORAL | Status: DC

## 2017-02-08 MED ORDER — DEXTROSE 5 % IV SOLN: 1.0000 g | Freq: Every day | INTRAVENOUS | Status: DC

## 2017-02-08 MED ORDER — ASPIRIN EC 81 MG PO TBEC
81.0000 mg | DELAYED_RELEASE_TABLET | Freq: Every day | ORAL | Status: DC
  Administered 2017-02-08: 81 mg via ORAL
  Filled 2017-02-08: qty 1

## 2017-02-08 MED ORDER — CINACALCET HCL 30 MG PO TABS
180.0000 mg | ORAL_TABLET | ORAL | Status: DC
  Administered 2017-02-08: 180 mg via ORAL
  Filled 2017-02-08: qty 6

## 2017-02-08 MED ORDER — IPRATROPIUM-ALBUTEROL 0.5-2.5 (3) MG/3ML IN SOLN: 3.0000 mL | RESPIRATORY_TRACT | Status: DC | PRN

## 2017-02-08 MED ORDER — AZITHROMYCIN 500 MG PO TABS: 500.0000 mg | ORAL_TABLET | Freq: Every day | ORAL | Status: DC

## 2017-02-08 NOTE — Progress Notes (Signed)
Page placed to resident on call Dr Jari Favre [phone], to inquire of consult to be placed to renal MD for pt dialysis orders, pt states he has HD on MWF.

## 2017-02-08 NOTE — H&P (Signed)
Date: 02/08/2017               Patient Name:  [name] [name] MRN: [id number]  DOB: 1956-07-14 Age / Sex: 61 y.o., male   PCP: [name], MD         Medical Service: Internal Medicine Teaching Service         Attending Physician: Dr. Axel Filler, MD    First Contact: Dr. Jari Favre  Pager: [phone]  Second Contact: Dr. Benjamine Mola Pager: [phone]       After Hours (After 5p/  First Contact Pager: [phone]  weekends / holidays): Second Contact Pager: [phone]   Chief Complaint: cough, chest pain, shortness of breath  History of Present Illness:  61 yo man with PMH ESRD, CAD s/p LAD stent x 2, chronic diastolic CHF, HTN, HLD, mod mitral regurg, mod- severe tricuspid regurg and GERD presents with chest pain, shortness of breath and cough productive of green sputum. He began coughing days ago, this lead to development of abdominal pain and nausea related to the cough. Today he developed a burning chest pain which is only associated with coughing, he thinks this is related to his excess mucus. The chest pain does not change with position, he denies chest wall tenderness. He has also had rhinorrhea and decreased appetite. He denies sinus pressure, fevers or chills, sore throat or myalgia. He has no known sick contacts and is up to date on his flu and pneumonia vaccination.  In the ED he was afebrile with heart rate, resp rate, and BP stable SpO2 90% on room air which improved to 97% on 2 L [place]. Labs revealed Na 138, K 4.3, CO2 30, BUN 34, Crt 8.64, AST 13, ALT 14, WBC 8.4, Hgb 9.6, BNP 1116, POC trop 0.02. Chest xray revealed bilateral interstitial opacities without consolidation.   Meds:  Current Meds  Medication Sig  . albuterol (PROVENTIL HFA;VENTOLIN HFA) 108 (90 Base) MCG/ACT inhaler Inhale 1-2 puffs into the lungs every 6 (six) hours as needed for wheezing or shortness of breath.  [name] amLODipine (NORVASC) 10 MG tablet Take 1 tablet (10 mg total) by mouth daily.  [name] aspirin 81 MG chewable  tablet Chew 1 tablet (81 mg total) by mouth daily.  [name] atorvastatin (LIPITOR) 20 MG tablet Take 20 mg by mouth at bedtime. Reported on 03/14/2016  . carvedilol (COREG) 25 MG tablet Take 1 tablet (25 mg total) by mouth 2 (two) times daily with a meal.  . cinacalcet (SENSIPAR) 90 MG tablet Take 90 mg by mouth at bedtime.   . multivitamin (RENA-VIT) TABS tablet Take 1 tablet by mouth at bedtime.  . pregabalin (LYRICA) 50 MG capsule Take 1 capsule (50 mg total) by mouth daily. (Patient taking differently: Take 50 mg by mouth at bedtime. )  . PRESCRIPTION MEDICATION Take 2-4 tablets by mouth See admin instructions. Take 4 tablets with meals and take 2 tablets with snacks   Gets Mail Ordered but doesn't know the name     Allergies: Allergies as of 02/07/2017 - Review Complete 02/07/2017  Allergen Reaction Noted  . Enalapril Hives 04/26/2014  . Latex Rash 09/23/2016  . Tape Rash 09/23/2016   Past Medical History:  Diagnosis Date  . Anemia   . Anginal pain ([place])   . CAD (coronary artery disease)    a. per [facility] s/p 3.67mm x 71mm Vision BMS to mid LAD 12/2009 and Xience DES to mid LAD 10/2010.  [name] Chronic diastolic CHF (congestive heart failure) (  HCC)   . Colon polyps   . Daily headache   . ESRD on dialysis [facility]) since ~ 2008   "[place]; TTS" (07/21/2015)  . GERD (gastroesophageal reflux disease)   . Heart murmur   . Hematochezia    a. 2014: colonscopy, which showed moderately-sized internal hemorrhoids, two 69mm polyps in transverse colon and ascending colon that were resected, five 2-48mm polyps in sigmoid colon, descending colon, transverse colon, and ascending colon that were resected. An upper endoscopy was performed and showed normal esophagus, stomach, and duodenum.  . Hematuria    a. H/o hematuria 2014 with cystoscopy that was unrevealing for his source of hematuria. He underwent a kidney ultrasound on 10/14 that showed mildly echogenic and scarred kidneys compatible with medical  renal disease, without hydronephrosis or renal calculi.  [name] History of blood transfusion    "had colonoscopy done; they had to give me some blood"  . Hyperlipidemia   . Hypertension   . On home oxygen therapy    "2L prn" (07/21/2015)  . Renal insufficiency   . 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 Type II diabetes mellitus (HCC)     Family History:   Family History  Problem Relation Age of Onset  . Bone cancer Mother   . Anuerysm Father   . Hypertension    . Diabetes type II Daughter    Social History:  Smokes 1 pack of cigarettes every 2 weeks for the past 8 years. Smokes cigars regularly. Denies alcohol or illicit drug use.   Review of Systems: A complete ROS was negative except as per HPI.   Physical Exam: Blood pressure (!) 141/93, pulse 77, temperature 98.8 F (37.1 C), temperature source Oral, resp. rate 20, height 6' (1.829 m), weight 207 lb (93.9 kg), SpO2 98 %. Physical Exam  Constitutional: He is oriented to person, place, and time and well-developed, well-nourished, and in no distress. No distress.  HENT:  Head: Normocephalic and atraumatic.  Cardiovascular: Normal rate and regular rhythm.   No murmur heard. No peripheral edema   Pulmonary/Chest: No respiratory distress. He has wheezes.  On nasal canula. Decreased air entry b/l wheezes and b/l fine crackles   Abdominal: Soft. He exhibits no distension. There is no tenderness. There is no guarding.  Hyperactive bowel sounds   Neurological: He is alert and oriented to person, place, and time.  Skin: Skin is warm and dry. He is not diaphoretic.  Multiple tattoos    EKG: Sinus rhythm with QTc 484  CXR: personal review of the chest xray reveals bilateral interstitial infiltrates  Assessment & Plan by Problem: Principal Problem:   Acute respiratory failure with hypoxia (HCC) Active Problems:   Hypertension   Diabetes mellitus type 2, diet-controlled (HCC)   ESRD (end stage renal  disease) on dialysis ([place])   Acute pulmonary edema (HCC)  Acute hypoxic respiratory failure  Acute on chronic diastolic CHF ( 10/6965 E9FY, EF 50%)  Acute pulmonary edema  New oxygen requirement is likely multifactorial and primarily related to CAP with some component of volume overload. On exam he has fine crackles and wheezes with no peripheral edema, chest xray revealed bilateral infiltrates concerning for edema and he has an elevated BNP. He would likely benefit from having volume taken off the next time that he goes for dialysis. He received 1 dose of vancomycin and cefepime in the ED.  - ordered ceftriaxone and azithromycin to be started tomorrow afternoon  -  follow up pro calcitonin  -follow up flu PCR  -Duonebs q4 h PRN  - will benefit from extra fluid taken off during dialysis   Hypertension  -Continue home medications include amlodipine 10 mg daily  Hx of coronary artery disease s/p stenting x2  -continue home medicationsvASA 81 mg daily, atorvastatin 20 mg daily, carvedilol 25 mg BID.    Diet controlled diabetes mellitus  Has been on insulin in the bast but lost a significant amount of weight and is not diet and exercise controlled.  - ordered CBG with meals   Dispo: Admit patient to Observation with expected length of stay less than 2 midnights.  Signed: Ledell Noss, MD 02/08/2017, 2:52 AM  Pager: [phone]

## 2017-02-08 NOTE — Progress Notes (Signed)
Pt. States he's ready for d/c and no order written yet; will leave AMA if he has too; Dr. Reesa Chew notified and will d/c pt.; pt. started walking out after IV d/c'd and Cheryl, CN printed d/c Instructions and explained to pt. Pt. Walked out and didn't wait for staff to walk him out; stated he has to go. No distress noted.

## 2017-02-08 NOTE — Procedures (Signed)
  I was present at this dialysis session, have reviewed the session itself and made  appropriate changes Kelly Splinter MD [place] pager [phone]   02/08/2017, 3:12 PM

## 2017-02-08 NOTE — Plan of Care (Signed)
Problem: Education: Goal: Knowledge of [place] [facility] Education information/materials will improve Outcome: Completed/Met Date Met: 02/08/17 D/C instructions given to pt. by [facility]- see progress noted.

## 2017-02-08 NOTE — Discharge Summary (Signed)
Name: [name] [name] MRN: [id number] DOB: 09-09-1956 61 y.o. PCP: [name], MD  Date of Admission: 02/07/2017 10:13 PM Date of Discharge: 02/08/2017 Attending Physician: Vevelyn Pat, MD  Discharge Diagnosis: 1. Acute pulmonary edema Principal Problem:   Acute respiratory failure with hypoxia (HCC) Active Problems:   Hypertension   Diabetes mellitus type 2, diet-controlled (HCC)   ESRD (end stage renal disease) on dialysis ([place])   Acute pulmonary edema ([place])   Discharge Medications: Allergies as of 02/08/2017      Reactions   Enalapril Hives   Latex Rash   Tape Rash   TAPE MAKES SKIN BREAK OUT AND TURN RED      Medication List    STOP taking these medications   acetaminophen 325 MG tablet Commonly known as:  TYLENOL   amitriptyline 100 MG tablet Commonly known as:  ELAVIL   esomeprazole 20 MG capsule Commonly known as:  NEXIUM   hydrALAZINE 25 MG tablet Commonly known as:  APRESOLINE   HYDROcodone-acetaminophen 5-325 MG tablet Commonly known as:  NORCO/VICODIN   PRESCRIPTION MEDICATION   venlafaxine XR 75 MG 24 hr capsule Commonly known as:  EFFEXOR XR     TAKE these medications   albuterol 108 (90 Base) MCG/ACT inhaler Commonly known as:  PROVENTIL HFA;VENTOLIN HFA Inhale 1-2 puffs into the lungs every 6 (six) hours as needed for wheezing or shortness of breath.   amLODipine 5 MG tablet Commonly known as:  NORVASC Take 1 tablet (5 mg total) by mouth daily. What changed:  medication strength  how much to take   aspirin 81 MG chewable tablet Chew 1 tablet (81 mg total) by mouth daily.   atorvastatin 20 MG tablet Commonly known as:  LIPITOR Take 20 mg by mouth at bedtime. Reported on 03/14/2016   carvedilol 25 MG tablet Commonly known as:  COREG Take 1 tablet (25 mg total) by mouth 2 (two) times daily with a meal.   cinacalcet 90 MG tablet Commonly known as:  SENSIPAR Take 180 mg by mouth every Monday, Wednesday, and Friday with  hemodialysis.   multivitamin Tabs tablet Take 1 tablet by mouth at bedtime.   pregabalin 50 MG capsule Commonly known as:  LYRICA Take 1 capsule (50 mg total) by mouth daily. What changed:  when to take this   sevelamer carbonate 800 MG tablet Commonly known as:  RENVELA Take 4 tablets (3,200 mg total) by mouth 3 (three) times daily with meals.       Disposition and follow-up:   Mr.Roby Navarrete was discharged from [facility] in Stable condition.  At the hospital follow up visit please address:  1.   HTN: Patient's amlodipine was decreased to 5mg  daily in effort to accommodate reaching lower dry weight during dialysis; he was continued on his carvedilol 25mg  BID. Please assess compliance with medicines and need for change in therapy.  ESRD on HD: Please assess and encourage compliance with complete HD sessions; sodium and fluid restriction. Trying to establish new dry weight.  2.  Labs / imaging needed at time of follow-up: none  3.  Pending labs/ test needing follow-up: none  Follow-up Appointments:   Hospital Course by problem list: Principal Problem:   Acute respiratory failure with hypoxia ([place]) Active Problems:   Hypertension   Diabetes mellitus type 2, diet-controlled ([place])   ESRD (end stage renal disease) on dialysis ([place])   Acute pulmonary edema (HCC)   Chest pain 2/2 hypervolemia, ESRD on HD: Patient with  a couple of days with shortness of breath and productive cough without infectious signs or symptoms. Patient had left early from one dialysis session few days prior to presentation; also he endorsed losing weight. On admission, he was afebrile without tachycardia, leukocytosis, focal rales on exam or consolidation on CXR but did show diffuse bilateral infiltrates consistent with mild pulmonary edema. He did have mild hypoxia that improved with 2L [place]. He was initially started on antibiotics for possible CAP but these were discontinued as it was felt  his presentation was more consistent with fluid overload secondary to short session and possible need to establish new dry weight in setting of weight loss. Patient received HD and was evaluated by nephrology who agreed to trying to establish new dry weight. Patient had resolution of his symptoms and no further necessity for supplemental oxygen even with exertion. Patient was advised to complete his HD sessions and reminded of fluid/salt restriction.   HTN: Patient was continued on his carvedilol 25mg  twice a day. His amlodipine was decreased to 5mg  daily (from 10mg ) while new dry weight is being established.   CAD s/p stent:  Patient was continued on home ASA 81mg  daily, atorvastatin 20mg  daily, and carvedilol 25mg  BID.   Discharge Vitals:   BP (!) 170/85 (BP Location: Right Arm)   Pulse 95   Temp 99.3 F (37.4 C) (Oral)   Resp 18   Ht 6' (1.829 m)   Wt 87.8 kg (193 lb 9 oz)   SpO2 93%   BMI 26.25 kg/m   Pertinent Labs, Studies, and Procedures:  CBC Latest Ref Rng & Units 02/08/2017 02/07/2017 02/07/2017  WBC 4.0 - 10.5 K/uL 6.7 - 8.4  Hemoglobin 13.0 - 17.0 g/dL 8.8(L) 10.9(L) 9.6(L)  Hematocrit 39.0 - 52.0 % 27.2(L) 32.0(L) 30.9(L)  Platelets 150 - 400 K/uL 121(L) - 161   BMP Latest Ref Rng & Units 02/08/2017 02/07/2017 02/07/2017  Glucose 65 - 99 mg/dL 104(H) 96 104(H)  BUN 6 - 20 mg/dL 40(H) 36(H) 34(H)  Creatinine 0.61 - 1.24 mg/dL 9.03(H) 8.80(H) 8.64(H)  Sodium 135 - 145 mmol/L 140 140 138  Potassium 3.5 - 5.1 mmol/L 4.8 4.4 4.3  Chloride 101 - 111 mmol/L 97(L) 97(L) 94(L)  CO2 22 - 32 mmol/L 27 - 30  Calcium 8.9 - 10.3 mg/dL 8.5(L) - 9.2   Flu panel: negative HIV Ab: negative CXR: diffuse bilateral interstitial opacities; no focal consolidation.  Discharge Instructions: Discharge Instructions    (HEART FAILURE PATIENTS) Call MD:  Anytime you have any of the following symptoms: 1) 3 pound weight gain in 24 hours or 5 pounds in 1 week 2) shortness of breath, with or without a  dry hacking cough 3) swelling in the hands, feet or stomach 4) if you have to sleep on extra pillows at night in order to breathe.    Complete by:  As directed    Call MD for:  difficulty breathing, headache or visual disturbances    Complete by:  As directed    Call MD for:  extreme fatigue    Complete by:  As directed    Call MD for:  persistant dizziness or light-headedness    Complete by:  As directed    Call MD for:  persistant nausea and vomiting    Complete by:  As directed    Call MD for:  redness, tenderness, or signs of infection (pain, swelling, redness, odor or green/yellow discharge around incision site)    Complete by:  As  directed    Call MD for:  severe uncontrolled pain    Complete by:  As directed    Call MD for:  temperature >100.4    Complete by:  As directed    Diet - low sodium heart healthy    Complete by:  As directed    Discharge instructions    Complete by:  As directed    Please continue going to your dialysis sessions regularly and staying the entire time to help prevent fluid from building up and causing shortness of breath. Your kidney doctor will work with the dialysis site to help establish a lower dry weight for you.   For your blood pressure, please keep taking carvedilol 25mg  twice a day; take only 5mg  of amlodipine for now as we are adjusting your dialysis. These prescriptions were sent to your pharmacy.  Please make an appointment with the Internal Medicine clinic to be seen in about 1-2 weeks to re-evaluate your blood pressure.   Increase activity slowly    Complete by:  As directed       Signed: Alphonzo Grieve, MD 02/08/2017, 8:04 PM   Pager [phone]

## 2017-02-08 NOTE — Progress Notes (Signed)
New Admission Note: pt transferred from the [facility] to room 6E13  Arrival Method: via stretcher Mental Orientation: Alert and oriented x 4 Telemetry: N/A Assessment: Completed Skin: R Great toe ulcer IV: SL Pain: Denies Tubes: None Safety Measures: Safety Fall Prevention Plan has been discussed  Admission: To be completed 6 [place] Orientation: Patient has been orientated to the room, unit and staff.  Family: None at bedside  Orders to be reviewed and implemented. Will continue to monitor the patient. Call light has been placed within reach and bed alarm has been activated.   Mady Gemma, BSN, RN-BC Phone: [phone]

## 2017-02-08 NOTE — Progress Notes (Signed)
Pt just back from HD, ambulated in hall with NT as MD requested.    Pt SAT at rest on RA---94%  SAT ambulating on RA---97-100%  SAT upon return to room 97%.  Will page MD covering as requested, possible d/c home this evening.

## 2017-02-08 NOTE — Consult Note (Signed)
[facility] Renal Consultation Note    Indication for Consultation:  Management of ESRD/hemodialysis, anemia, hypertension/volume, and secondary hyperparathyroidism. PCP:  HPI: [name] [name] is a 61 y.o. male with ESRD, HTN, CAD (s/p stents), Type 2 DM who was admitted with dyspnea and chest pain.   C/o URI symptoms for the past few days with cough, last night developed dyspnea and CP. ED evaluation with normal labs, high BNP, and CXR with "diffuse bilateral interstitial opacities may reflect mild edema or interstitial inflammatory infiltrates." Initially was mildly hypoxic, improved with nasal oxygen. Started empirically on Vanc/Cefepime in ED. Denies abdominal pain (just soreness from coughing), no N/V/D. Admitted to low appetite recently.   From renal standpoint, dialyzed MWF at [facility], due for HD today. Not reaching EDW recently which he says is due to hypotension and cramping. No recent AVF issues.  Past Medical History:  Diagnosis Date  . Anemia   . Anginal pain ([place])   . CAD (coronary artery disease)    a. per [facility] s/p 3.50mm x 32mm Vision BMS to mid LAD 12/2009 and Xience DES to mid LAD 10/2010.  [name] Chronic diastolic CHF (congestive heart failure) ([place])   . Colon polyps   . Daily headache   . ESRD on dialysis [facility]) since ~ 2008   "[place]; TTS" (07/21/2015)  . GERD (gastroesophageal reflux disease)   . Heart murmur   . Hematochezia    a. 2014: colonscopy, which showed moderately-sized internal hemorrhoids, two 40mm polyps in transverse colon and ascending colon that were resected, five 2-72mm polyps in sigmoid colon, descending colon, transverse colon, and ascending colon that were resected. An upper endoscopy was performed and showed normal esophagus, stomach, and duodenum.  . Hematuria    a. H/o hematuria 2014 with cystoscopy that was unrevealing for his source of hematuria. He underwent a kidney ultrasound on 10/14 that showed mildly echogenic and scarred  kidneys compatible with medical renal disease, without hydronephrosis or renal calculi.  [name] History of blood transfusion    "had colonoscopy done; they had to give me some blood"  . Hyperlipidemia   . Hypertension   . On home oxygen therapy    "2L prn" (07/21/2015)  . Renal insufficiency   . 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 Type II diabetes mellitus ([place])    Past Surgical History:  Procedure Laterality Date  . AV FISTULA PLACEMENT Left ~ 2007   "upper arm"  . CARDIAC CATHETERIZATION  "several"  . CORONARY ANGIOPLASTY WITH STENT PLACEMENT  "several"  . CYSTOSCOPY W/ STONE MANIPULATION  X2?  . EYE SURGERY Bilateral    "laser OR for hemorrhage"  . LEFT HEART CATHETERIZATION WITH CORONARY ANGIOGRAM N/A 11/23/2014   Procedure: LEFT HEART CATHETERIZATION WITH CORONARY ANGIOGRAM;  Surgeon: Troy Sine, MD;  Location: [facility] CATH LAB;  Service: Cardiovascular;  Laterality: N/A;  . LITHOTRIPSY  X1   Family History  Problem Relation Age of Onset  . Bone cancer Mother   . Anuerysm Father   . Hypertension    . Diabetes type II Daughter    Social History:  reports that he has been smoking Cigarettes.  He has a 4.00 pack-year smoking history. He has never used smokeless tobacco. He reports that he does not drink alcohol or use drugs.  ROS: As per HPI otherwise negative.  Physical Exam: Vitals:   02/08/17 0315 02/08/17 0413 02/08/17 0900 02/08/17 0903  BP: 128/82 132/76 129/77  Pulse: 78 76 79   Resp: 18 18 16    Temp:  97.4 F (36.3 C) 98.2 F (36.8 C)   TempSrc:  Oral Oral   SpO2: 97% 96% 96% 93%  Weight:      Height:         General: Well developed, well nourished, in no acute distress. On nasal oxygen. Head: Normocephalic, atraumatic, sclera non-icteric, mucus membranes are moist. Neck: Supple without lymphadenopathy/masses. Lungs: Inspiratory wheezing throughout, rales in LLL Heart: RRR with normal S1, S2. 2/6 systolic murmur  appreciated. Abdomen: Soft, non-tender. Musculoskeletal:  Strength and tone appear normal for age. Lower extremities: No edema or ischemic changes, no open wounds. Neuro: Alert and oriented X 3. Moves all extremities spontaneously. Psych:  Responds to questions appropriately with a normal affect. Dialysis Access: LUE aneurysmal AVF + bruit/thrill (has [facility] appt in near future to discuss aneurysm plication)  Allergies  Allergen Reactions  . Enalapril Hives  . Latex Rash  . Tape Rash    TAPE MAKES SKIN BREAK OUT AND TURN RED   Prior to Admission medications   Medication Sig Start Date End Date Taking? Authorizing Provider  albuterol (PROVENTIL HFA;VENTOLIN HFA) 108 (90 Base) MCG/ACT inhaler Inhale 1-2 puffs into the lungs every 6 (six) hours as needed for wheezing or shortness of breath. 01/19/16  Yes Delsa Grana, PA-C  amLODipine (NORVASC) 10 MG tablet Take 1 tablet (10 mg total) by mouth daily. 10/30/15  Yes Ripudeep Krystal Eaton, MD  aspirin 81 MG chewable tablet Chew 1 tablet (81 mg total) by mouth daily. 07/22/15  Yes Iline Oven, MD  atorvastatin (LIPITOR) 20 MG tablet Take 20 mg by mouth at bedtime. Reported on 03/14/2016   Yes Historical Provider, MD  carvedilol (COREG) 25 MG tablet Take 1 tablet (25 mg total) by mouth 2 (two) times daily with a meal. 03/12/16  Yes Geradine Girt, DO  cinacalcet (SENSIPAR) 90 MG tablet Take 90 mg by mouth at bedtime.    Yes Historical Provider, MD  multivitamin (RENA-VIT) TABS tablet Take 1 tablet by mouth at bedtime. 12/15/14  Yes Geradine Girt, DO  pregabalin (LYRICA) 50 MG capsule Take 1 capsule (50 mg total) by mouth daily. Patient taking differently: Take 50 mg by mouth at bedtime.  12/15/14  Yes Geradine Girt, DO  PRESCRIPTION MEDICATION Take 2-4 tablets by mouth See admin instructions. Take 4 tablets with meals and take 2 tablets with snacks   Gets Mail Ordered but doesn't know the name   Yes Historical Provider, MD  acetaminophen (TYLENOL) 325 MG  tablet Take 2 tablets (650 mg total) by mouth every 6 (six) hours as needed for mild pain (or Fever >/= 101). Patient not taking: Reported on 09/23/2016 03/27/16   Kinnie Feil, MD  amitriptyline (ELAVIL) 100 MG tablet Take 1 tablet (100 mg total) by mouth at bedtime. Patient not taking: Reported on 09/23/2016 07/27/15   Shela Leff, MD  esomeprazole (NEXIUM) 20 MG capsule Take 1 capsule (20 mg total) by mouth daily. Patient not taking: Reported on 02/07/2017 04/02/16 04/02/17  Maryellen Pile, MD  hydrALAZINE (APRESOLINE) 25 MG tablet Take 1 tablet (25 mg total) by mouth 3 (three) times daily. Patient not taking: Reported on 02/07/2017 03/18/16   Reyne Dumas, MD  HYDROcodone-acetaminophen (NORCO/VICODIN) 5-325 MG tablet Take 2 tablets by mouth every 4 (four) hours as needed for moderate pain. Patient not taking: Reported on 02/07/2017 09/23/16   Orpah Greek, MD  sevelamer carbonate (RENVELA) 800  MG tablet Take 4 tablets (3,200 mg total) by mouth 3 (three) times daily with meals. Patient not taking: Reported on 02/07/2017 03/12/16   Geradine Girt, DO  venlafaxine XR (EFFEXOR XR) 75 MG 24 hr capsule Take 1 capsule (75 mg total) by mouth daily with breakfast. Patient not taking: Reported on 02/07/2017 07/27/15   Shela Leff, MD   Current Facility-Administered Medications  Medication Dose Route Frequency Provider Last Rate Last Dose  . acetaminophen (TYLENOL) tablet 650 mg  650 mg Oral Q6H PRN Ledell Noss, MD       Or  . acetaminophen (TYLENOL) suppository 650 mg  650 mg Rectal Q6H PRN Ledell Noss, MD      . amLODipine (NORVASC) tablet 10 mg  10 mg Oral Daily Ledell Noss, MD      . aspirin EC tablet 81 mg  81 mg Oral Daily Ledell Noss, MD      . azithromycin (ZITHROMAX) tablet 500 mg  500 mg Oral Daily Alphonzo Grieve, MD       Followed by  . [START ON 02/09/2017] azithromycin (ZITHROMAX) tablet 250 mg  250 mg Oral Daily Alphonzo Grieve, MD      . guaiFENesin (MUCINEX) 12 hr tablet 1,200 mg   1,200 mg Oral BID Ledell Noss, MD   1,200 mg at 02/08/17 1040  . heparin injection 5,000 Units  5,000 Units Subcutaneous Q8H Ledell Noss, MD   5,000 Units at 02/08/17 [phone]  . ipratropium-albuterol (DUONEB) 0.5-2.5 (3) MG/3ML nebulizer solution 3 mL  3 mL Nebulization Q6H Axel Filler, MD   3 mL at 02/08/17 0903  . senna-docusate (Senokot-S) tablet 1 tablet  1 tablet Oral QHS PRN Ledell Noss, MD       Labs: Basic Metabolic Panel:  Recent Labs Lab 02/07/17 2153 02/07/17 2207 02/08/17 0446  NA 138 140 140  K 4.3 4.4 4.8  CL 94* 97* 97*  CO2 30  --  27  GLUCOSE 104* 96 104*  BUN 34* 36* 40*  CREATININE 8.64* 8.80* 9.03*  CALCIUM 9.2  --  8.5*   Liver Function Tests:  Recent Labs Lab 02/07/17 2153  AST 13*  ALT 14*  ALKPHOS 58  BILITOT 1.2  PROT 8.4*  ALBUMIN 3.5   CBC:  Recent Labs Lab 02/07/17 2153 02/07/17 2207 02/08/17 0446  WBC 8.4  --  6.7  NEUTROABS 6.8  --   --   HGB 9.6* 10.9* 8.8*  HCT 30.9* 32.0* 27.2*  MCV 84.7  --  85.3  PLT 161  --  121*   CBG:  Recent Labs Lab 02/08/17 0757  GLUCAP 121*   Studies/Results: Dg Chest 2 View  Result Date: 02/07/2017 CLINICAL DATA:  Central chest pain with cough EXAM: CHEST  2 VIEW COMPARISON:  09/23/2016 FINDINGS: Lungs are slightly hyperinflated. Mild elevation of left diaphragm as before. Diffuse bilateral interstitial opacity. No focal consolidation or effusion. Stable borderline cardiomegaly with atherosclerosis. No pneumothorax. IMPRESSION: 1. Diffuse bilateral interstitial opacities may reflect mild edema or interstitial inflammatory infiltrates. 2. There is no focal consolidation 3. Stable borderline cardiomegaly with mild central congestion Electronically Signed   By: Donavan Foil M.D.   On: 02/07/2017 22:23    Dialysis Orders:  MWF at [facility] 4 hours, BFR 500, DFR 800, 2K/2Ca bath, AVF, EDW 88kg - Heparin 8000 units q HD - Venofer 50mg  IV q week - Mircera 7mcg (ordered but not given yet, was to start  today) - Calcitriol 37mcg PO q HD - Sensipar  180mg  given IN-CENTER PO q HD  Assessment/Plan: 1.  Possible pneumonia: Started empirically on Vanc/Cefepime, now narrowed to Ceftriaxone/Azithro. Flu test negative. 2.  ESRD: Continue MWF schedule, next due today (5/4). Trying to get down to EDW if tolerated, hopefully push lower. 3.  Hypertension/volume: BP controlled, will cut back on amlodipine 10->5mg  to give [place] more room for UF. 4.  Anemia: Hgb 8.8, start ESA today with HD (Aranesp 150mg  today). 5.  Metabolic bone disease: Ca ok, check Phos. Continue VDRA/sensipar/renvela. 6.  CAD: Per primary.  Veneta Penton, PA-C 02/08/2017, 11:32 AM  [facility] Pager: [phone]  Pt seen, examined, agree w assess/plan as above with additions as indicated. ESRD pt with SOB for 2-3 days, hypoxemia in ED and CXR showing IS edema. Suspect vol overload, is on HD now, max UF as tolerated.  Will follow.  Kelly Splinter MD [employer] pager [phone]    cell [phone] 02/08/2017, 3:08 PM

## 2017-02-08 NOTE — Progress Notes (Addendum)
Pharmacy Antibiotic Note  Mayur Duman is a 61 y.o. male admitted on 02/07/2017 with pneumonia.  Pharmacy has been consulted for vancomycin dosing. Patient is ESRD with last dialysis on 02/06/17.   Plan: Vancomycin 2000mg  IV once.  Goal trough 15-20 mcg/mL. Cefepime 1000mg  IV once F/U HD schedule F/U clinical progression, cultures and sensitivities   Height: 6' (182.9 cm) Weight: 207 lb (93.9 kg) IBW/kg (Calculated) : 77.6  Temp (24hrs), Avg:98.8 F (37.1 C), Min:98.8 F (37.1 C), Max:98.8 F (37.1 C)   Recent Labs Lab 02/07/17 2153 02/07/17 2207  WBC 8.4  --   CREATININE 8.64* 8.80*    Estimated Creatinine Clearance: 10.8 mL/min (A) (by C-G formula based on SCr of 8.8 mg/dL (H)).    Allergies  Allergen Reactions  . Enalapril Hives  . Latex Rash  . Tape Rash    TAPE MAKES SKIN BREAK OUT AND TURN RED     Thank you for allowing pharmacy to be a part of this patient's care.  [place] 02/08/2017 12:25 AM   _________________________________________________________ Addendum:  Patient received one dose of vancomycin and cefepime in the ED. Now being transitioned to azithromycin and ceftriaxone.  Azithromycin 500mg  x5 days Ceftriaxone 1g QD  Georga Bora, PharmD Clinical Pharmacist 02/08/2017 4:41 AM

## 2017-02-08 NOTE — Progress Notes (Signed)
   Subjective:  Patient states he is significantly improved since presenting to ED. He denies further chest pain and feels his breathing is better. He has been walking around without dyspnea and without O2.  He states he has been compliant with HD sessions except for one day last week; he also endorses compliance with his medicines. He denies sick contacts or infectious symptoms like fevers, chills, nausea.   Objective:  Vital signs in last 24 hours: Vitals:   02/08/17 1340 02/08/17 1345 02/08/17 1350 02/08/17 1400  BP: (!) 145/86 (!) 153/84 (!) 148/82 (!) 147/85  Pulse: 85 83 82 79  Resp: 16     Temp: 97.9 F (36.6 C)     TempSrc: Oral     SpO2: 94%     Weight: 91.6 kg (201 lb 15.1 oz)     Height:       Constitutional: NAD, sitting up in bed CV: RRR, no murmurs, rubs or gallops appreciated, pulses intact, warm extremities Resp: Bibasilar rales, no increased work of breathing.  Abd: soft, NDNT  Assessment/Plan:  Principal Problem:   Acute respiratory failure with hypoxia (HCC) Active Problems:   Hypertension   Diabetes mellitus type 2, diet-controlled (HCC)   ESRD (end stage renal disease) on dialysis ([place])   Acute pulmonary edema (HCC)  Hypervolemia, ESRD on HD: Patient with a couple of days with shortness of breath and productive cough without infectious signs or symptoms. Exam reveals bibasilar rales consistent with volume overload. Patient might need EDW adjusted - he himself stated that he had lost weight; another contributor is leaving HD ~1hr early one day last week.  --stop Abx as exam and findings not consistent with CAP --renal consulted - patient receiving HD today; decreasing amlodipine to 5mg  to try to establish lower dry weight --will ambulate with pulse-ox after HD and likely discharge  CAD s/p stent: --continue ASA 81mg  daily, atorvastatin 20mg  daily, carvedilol 25mg  BID  T2DM: Diet controlled; stable.  Dispo: Anticipated discharge likely today after  HD.   Alphonzo Grieve, MD 02/08/2017, 2:17 PM IMTS - PGY1 Pager [phone]

## 2017-02-11 DIAGNOSIS — E1129 Type 2 diabetes mellitus with other diabetic kidney complication: Secondary | ICD-10-CM | POA: Diagnosis not present

## 2017-02-11 DIAGNOSIS — D631 Anemia in chronic kidney disease: Secondary | ICD-10-CM | POA: Diagnosis not present

## 2017-02-11 DIAGNOSIS — N2581 Secondary hyperparathyroidism of renal origin: Secondary | ICD-10-CM | POA: Diagnosis not present

## 2017-02-11 DIAGNOSIS — N186 End stage renal disease: Secondary | ICD-10-CM | POA: Diagnosis not present

## 2017-02-11 DIAGNOSIS — D509 Iron deficiency anemia, unspecified: Secondary | ICD-10-CM | POA: Diagnosis not present

## 2017-02-13 ENCOUNTER — Encounter: Payer: Medicare Other | Admitting: Vascular Surgery

## 2017-02-13 DIAGNOSIS — N186 End stage renal disease: Secondary | ICD-10-CM | POA: Diagnosis not present

## 2017-02-13 DIAGNOSIS — E1129 Type 2 diabetes mellitus with other diabetic kidney complication: Secondary | ICD-10-CM | POA: Diagnosis not present

## 2017-02-13 DIAGNOSIS — D509 Iron deficiency anemia, unspecified: Secondary | ICD-10-CM | POA: Diagnosis not present

## 2017-02-13 DIAGNOSIS — D631 Anemia in chronic kidney disease: Secondary | ICD-10-CM | POA: Diagnosis not present

## 2017-02-13 DIAGNOSIS — N2581 Secondary hyperparathyroidism of renal origin: Secondary | ICD-10-CM | POA: Diagnosis not present

## 2017-02-14 ENCOUNTER — Telehealth: Payer: Self-pay | Admitting: Pharmacist

## 2017-02-15 NOTE — Progress Notes (Signed)
Unable to reach patient.

## 2017-02-18 DIAGNOSIS — E1129 Type 2 diabetes mellitus with other diabetic kidney complication: Secondary | ICD-10-CM | POA: Diagnosis not present

## 2017-02-18 DIAGNOSIS — N2581 Secondary hyperparathyroidism of renal origin: Secondary | ICD-10-CM | POA: Diagnosis not present

## 2017-02-18 DIAGNOSIS — N186 End stage renal disease: Secondary | ICD-10-CM | POA: Diagnosis not present

## 2017-02-18 DIAGNOSIS — D631 Anemia in chronic kidney disease: Secondary | ICD-10-CM | POA: Diagnosis not present

## 2017-02-18 DIAGNOSIS — D509 Iron deficiency anemia, unspecified: Secondary | ICD-10-CM | POA: Diagnosis not present

## 2017-02-19 ENCOUNTER — Encounter: Payer: Self-pay | Admitting: Vascular Surgery

## 2017-02-20 ENCOUNTER — Ambulatory Visit: Payer: Medicare Other

## 2017-02-20 DIAGNOSIS — D509 Iron deficiency anemia, unspecified: Secondary | ICD-10-CM | POA: Diagnosis not present

## 2017-02-20 DIAGNOSIS — E1129 Type 2 diabetes mellitus with other diabetic kidney complication: Secondary | ICD-10-CM | POA: Diagnosis not present

## 2017-02-20 DIAGNOSIS — N186 End stage renal disease: Secondary | ICD-10-CM | POA: Diagnosis not present

## 2017-02-20 DIAGNOSIS — D631 Anemia in chronic kidney disease: Secondary | ICD-10-CM | POA: Diagnosis not present

## 2017-02-20 DIAGNOSIS — N2581 Secondary hyperparathyroidism of renal origin: Secondary | ICD-10-CM | POA: Diagnosis not present

## 2017-02-22 DIAGNOSIS — D631 Anemia in chronic kidney disease: Secondary | ICD-10-CM | POA: Diagnosis not present

## 2017-02-22 DIAGNOSIS — D509 Iron deficiency anemia, unspecified: Secondary | ICD-10-CM | POA: Diagnosis not present

## 2017-02-22 DIAGNOSIS — N2581 Secondary hyperparathyroidism of renal origin: Secondary | ICD-10-CM | POA: Diagnosis not present

## 2017-02-22 DIAGNOSIS — E1129 Type 2 diabetes mellitus with other diabetic kidney complication: Secondary | ICD-10-CM | POA: Diagnosis not present

## 2017-02-22 DIAGNOSIS — N186 End stage renal disease: Secondary | ICD-10-CM | POA: Diagnosis not present

## 2017-02-25 DIAGNOSIS — D631 Anemia in chronic kidney disease: Secondary | ICD-10-CM | POA: Diagnosis not present

## 2017-02-25 DIAGNOSIS — E1129 Type 2 diabetes mellitus with other diabetic kidney complication: Secondary | ICD-10-CM | POA: Diagnosis not present

## 2017-02-25 DIAGNOSIS — D509 Iron deficiency anemia, unspecified: Secondary | ICD-10-CM | POA: Diagnosis not present

## 2017-02-25 DIAGNOSIS — N2581 Secondary hyperparathyroidism of renal origin: Secondary | ICD-10-CM | POA: Diagnosis not present

## 2017-02-25 DIAGNOSIS — N186 End stage renal disease: Secondary | ICD-10-CM | POA: Diagnosis not present

## 2017-02-27 ENCOUNTER — Encounter: Payer: Medicare Other | Admitting: Vascular Surgery

## 2017-02-27 DIAGNOSIS — N2581 Secondary hyperparathyroidism of renal origin: Secondary | ICD-10-CM | POA: Diagnosis not present

## 2017-02-27 DIAGNOSIS — N186 End stage renal disease: Secondary | ICD-10-CM | POA: Diagnosis not present

## 2017-02-27 DIAGNOSIS — D631 Anemia in chronic kidney disease: Secondary | ICD-10-CM | POA: Diagnosis not present

## 2017-02-27 DIAGNOSIS — E1129 Type 2 diabetes mellitus with other diabetic kidney complication: Secondary | ICD-10-CM | POA: Diagnosis not present

## 2017-02-27 DIAGNOSIS — D509 Iron deficiency anemia, unspecified: Secondary | ICD-10-CM | POA: Diagnosis not present

## 2017-03-01 DIAGNOSIS — D509 Iron deficiency anemia, unspecified: Secondary | ICD-10-CM | POA: Diagnosis not present

## 2017-03-01 DIAGNOSIS — D631 Anemia in chronic kidney disease: Secondary | ICD-10-CM | POA: Diagnosis not present

## 2017-03-01 DIAGNOSIS — N186 End stage renal disease: Secondary | ICD-10-CM | POA: Diagnosis not present

## 2017-03-01 DIAGNOSIS — N2581 Secondary hyperparathyroidism of renal origin: Secondary | ICD-10-CM | POA: Diagnosis not present

## 2017-03-01 DIAGNOSIS — E1129 Type 2 diabetes mellitus with other diabetic kidney complication: Secondary | ICD-10-CM | POA: Diagnosis not present

## 2017-03-04 ENCOUNTER — Encounter (HOSPITAL_COMMUNITY): Payer: Self-pay | Admitting: *Deleted

## 2017-03-04 ENCOUNTER — Observation Stay (HOSPITAL_COMMUNITY)
Admission: EM | Admit: 2017-03-04 | Discharge: 2017-03-05 | Disposition: A | Discharge status: Home / Self Care | Payer: Medicare Other | Attending: Internal Medicine | Admitting: Internal Medicine

## 2017-03-04 ENCOUNTER — Emergency Department (HOSPITAL_COMMUNITY): Payer: Medicare Other

## 2017-03-04 DIAGNOSIS — Z79899 Other long term (current) drug therapy: Secondary | ICD-10-CM | POA: Diagnosis not present

## 2017-03-04 DIAGNOSIS — Z87891 Personal history of nicotine dependence: Secondary | ICD-10-CM | POA: Diagnosis not present

## 2017-03-04 DIAGNOSIS — R079 Chest pain, unspecified: Secondary | ICD-10-CM | POA: Diagnosis not present

## 2017-03-04 DIAGNOSIS — Z7982 Long term (current) use of aspirin: Secondary | ICD-10-CM | POA: Diagnosis not present

## 2017-03-04 DIAGNOSIS — E1122 Type 2 diabetes mellitus with diabetic chronic kidney disease: Secondary | ICD-10-CM | POA: Diagnosis not present

## 2017-03-04 DIAGNOSIS — Z955 Presence of coronary angioplasty implant and graft: Secondary | ICD-10-CM | POA: Diagnosis not present

## 2017-03-04 DIAGNOSIS — I132 Hypertensive heart and chronic kidney disease with heart failure and with stage 5 chronic kidney disease, or end stage renal disease: Secondary | ICD-10-CM | POA: Diagnosis not present

## 2017-03-04 DIAGNOSIS — Z888 Allergy status to other drugs, medicaments and biological substances status: Secondary | ICD-10-CM | POA: Diagnosis not present

## 2017-03-04 DIAGNOSIS — K219 Gastro-esophageal reflux disease without esophagitis: Secondary | ICD-10-CM | POA: Diagnosis not present

## 2017-03-04 DIAGNOSIS — I10 Essential (primary) hypertension: Secondary | ICD-10-CM | POA: Diagnosis present

## 2017-03-04 DIAGNOSIS — E11621 Type 2 diabetes mellitus with foot ulcer: Secondary | ICD-10-CM | POA: Diagnosis not present

## 2017-03-04 DIAGNOSIS — R0789 Other chest pain: Secondary | ICD-10-CM | POA: Diagnosis not present

## 2017-03-04 DIAGNOSIS — D509 Iron deficiency anemia, unspecified: Secondary | ICD-10-CM | POA: Diagnosis not present

## 2017-03-04 DIAGNOSIS — Z8611 Personal history of tuberculosis: Secondary | ICD-10-CM | POA: Diagnosis not present

## 2017-03-04 DIAGNOSIS — Z8639 Personal history of other endocrine, nutritional and metabolic disease: Secondary | ICD-10-CM

## 2017-03-04 DIAGNOSIS — Z91040 Latex allergy status: Secondary | ICD-10-CM | POA: Diagnosis not present

## 2017-03-04 DIAGNOSIS — I5033 Acute on chronic diastolic (congestive) heart failure: Secondary | ICD-10-CM | POA: Diagnosis not present

## 2017-03-04 DIAGNOSIS — Z992 Dependence on renal dialysis: Secondary | ICD-10-CM | POA: Insufficient documentation

## 2017-03-04 DIAGNOSIS — N2581 Secondary hyperparathyroidism of renal origin: Secondary | ICD-10-CM | POA: Diagnosis not present

## 2017-03-04 DIAGNOSIS — L97519 Non-pressure chronic ulcer of other part of right foot with unspecified severity: Secondary | ICD-10-CM | POA: Diagnosis not present

## 2017-03-04 DIAGNOSIS — I2510 Atherosclerotic heart disease of native coronary artery without angina pectoris: Secondary | ICD-10-CM | POA: Diagnosis not present

## 2017-03-04 DIAGNOSIS — N186 End stage renal disease: Secondary | ICD-10-CM | POA: Diagnosis not present

## 2017-03-04 DIAGNOSIS — Z9981 Dependence on supplemental oxygen: Secondary | ICD-10-CM | POA: Diagnosis not present

## 2017-03-04 DIAGNOSIS — E1140 Type 2 diabetes mellitus with diabetic neuropathy, unspecified: Secondary | ICD-10-CM | POA: Diagnosis not present

## 2017-03-04 DIAGNOSIS — E785 Hyperlipidemia, unspecified: Secondary | ICD-10-CM | POA: Diagnosis not present

## 2017-03-04 DIAGNOSIS — Z9861 Coronary angioplasty status: Secondary | ICD-10-CM

## 2017-03-04 DIAGNOSIS — E1129 Type 2 diabetes mellitus with other diabetic kidney complication: Secondary | ICD-10-CM | POA: Diagnosis not present

## 2017-03-04 DIAGNOSIS — D631 Anemia in chronic kidney disease: Secondary | ICD-10-CM | POA: Diagnosis not present

## 2017-03-04 LAB — TROPONIN I: Troponin I: <0.03 ng/mL (ref <0.03)

## 2017-03-04 LAB — BASIC METABOLIC PANEL WITH GFR
Anion gap: 14 (ref 5–15)
BUN: 16 mg/dL (ref 6–20)
CO2: 29 mmol/L (ref 22–32)
Calcium: 8.6 mg/dL — ABNORMAL LOW (ref 8.9–10.3)
Chloride: 95 mmol/L — ABNORMAL LOW (ref 101–111)
Creatinine, Ser: 6.46 mg/dL — ABNORMAL HIGH (ref 0.61–1.24)
GFR calc Af Amer: 10 mL/min — ABNORMAL LOW
GFR calc non Af Amer: 8 mL/min — ABNORMAL LOW
Glucose, Bld: 89 mg/dL (ref 65–99)
Potassium: 3.7 mmol/L (ref 3.5–5.1)
Sodium: 138 mmol/L (ref 135–145)

## 2017-03-04 LAB — I-STAT TROPONIN, ED: Troponin i, poc: 0.01 ng/mL (ref 0.00–0.08)

## 2017-03-04 LAB — GLUCOSE, CAPILLARY: Glucose-Capillary: 79 mg/dL (ref 65–99)

## 2017-03-04 LAB — CBC
HCT: 35.2 % — ABNORMAL LOW (ref 39.0–52.0)
Hemoglobin: 11.5 g/dL — ABNORMAL LOW (ref 13.0–17.0)
MCH: 27.5 pg (ref 26.0–34.0)
MCHC: 32.7 g/dL (ref 30.0–36.0)
MCV: 84.2 fL (ref 78.0–100.0)
Platelets: 158 K/uL (ref 150–400)
RBC: 4.18 MIL/uL — ABNORMAL LOW (ref 4.22–5.81)
RDW: 17 % — ABNORMAL HIGH (ref 11.5–15.5)
WBC: 7.7 K/uL (ref 4.0–10.5)

## 2017-03-04 LAB — LIPASE, BLOOD: LIPASE: 22 U/L (ref 11–51)

## 2017-03-04 MED ORDER — CINACALCET HCL 30 MG PO TABS: 180.0000 mg | ORAL_TABLET | ORAL | Status: DC

## 2017-03-04 MED ORDER — ASPIRIN 81 MG PO CHEW: 81.0000 mg | CHEWABLE_TABLET | Freq: Every day | ORAL | Status: DC

## 2017-03-04 MED ORDER — HEPARIN SODIUM (PORCINE) 5000 UNIT/ML IJ SOLN
5000.0000 [IU] | Freq: Three times a day (TID) | INTRAMUSCULAR | Status: DC
  Administered 2017-03-04 – 2017-03-05 (×2): 5000 [IU] via SUBCUTANEOUS
  Filled 2017-03-04 (×2): qty 1

## 2017-03-04 MED ORDER — SEVELAMER CARBONATE 800 MG PO TABS
3200.0000 mg | ORAL_TABLET | Freq: Three times a day (TID) | ORAL | Status: DC
  Administered 2017-03-05 (×2): 3200 mg via ORAL
  Filled 2017-03-04 (×2): qty 4

## 2017-03-04 MED ORDER — ONDANSETRON HCL 4 MG/2ML IJ SOLN: 4.0000 mg | Freq: Once | INTRAMUSCULAR | Status: DC

## 2017-03-04 MED ORDER — AMLODIPINE BESYLATE 5 MG PO TABS
5.0000 mg | ORAL_TABLET | Freq: Once | ORAL | Status: AC
  Administered 2017-03-04: 5 mg via ORAL
  Filled 2017-03-04: qty 1

## 2017-03-04 MED ORDER — GI COCKTAIL ~~LOC~~
30.0000 mL | Freq: Once | ORAL | Status: AC
  Administered 2017-03-04: 30 mL via ORAL
  Filled 2017-03-04: qty 30

## 2017-03-04 MED ORDER — ASPIRIN 81 MG PO CHEW: 324.0000 mg | CHEWABLE_TABLET | ORAL | Status: DC

## 2017-03-04 MED ORDER — NITROGLYCERIN 0.4 MG SL SUBL: 0.4000 mg | SUBLINGUAL_TABLET | SUBLINGUAL | Status: DC | PRN

## 2017-03-04 MED ORDER — PREGABALIN 25 MG PO CAPS
75.0000 mg | ORAL_CAPSULE | Freq: Every day | ORAL | Status: DC
  Administered 2017-03-04: 75 mg via ORAL
  Filled 2017-03-04: qty 3

## 2017-03-04 MED ORDER — ASPIRIN 325 MG PO TABS
325.0000 mg | ORAL_TABLET | Freq: Once | ORAL | Status: AC
  Administered 2017-03-04: 325 mg via ORAL
  Filled 2017-03-04: qty 1

## 2017-03-04 MED ORDER — ASPIRIN EC 81 MG PO TBEC
81.0000 mg | DELAYED_RELEASE_TABLET | Freq: Every day | ORAL | Status: DC
  Administered 2017-03-05: 81 mg via ORAL
  Filled 2017-03-04: qty 1

## 2017-03-04 MED ORDER — ASPIRIN 300 MG RE SUPP: 300.0000 mg | RECTAL | Status: DC

## 2017-03-04 MED ORDER — CARVEDILOL 12.5 MG PO TABS
25.0000 mg | ORAL_TABLET | Freq: Two times a day (BID) | ORAL | Status: DC
  Administered 2017-03-05: 25 mg via ORAL
  Filled 2017-03-04: qty 2

## 2017-03-04 MED ORDER — RENA-VITE PO TABS
1.0000 | ORAL_TABLET | Freq: Every day | ORAL | Status: DC
  Administered 2017-03-04: 1 via ORAL
  Filled 2017-03-04: qty 1

## 2017-03-04 MED ORDER — ONDANSETRON HCL 4 MG/2ML IJ SOLN
4.0000 mg | Freq: Four times a day (QID) | INTRAMUSCULAR | Status: DC | PRN
Start: 2017-03-04 — End: 2017-03-05

## 2017-03-04 MED ORDER — ENSURE ENLIVE PO LIQD: 237.0000 mL | Freq: Two times a day (BID) | ORAL | Status: DC

## 2017-03-04 MED ORDER — AMLODIPINE BESYLATE 5 MG PO TABS
10.0000 mg | ORAL_TABLET | Freq: Every day | ORAL | Status: DC
  Administered 2017-03-04 – 2017-03-05 (×2): 10 mg via ORAL
  Filled 2017-03-04 (×2): qty 2

## 2017-03-04 MED ORDER — ATORVASTATIN CALCIUM 20 MG PO TABS
20.0000 mg | ORAL_TABLET | Freq: Every day | ORAL | Status: DC
  Administered 2017-03-04: 20 mg via ORAL
  Filled 2017-03-04: qty 1

## 2017-03-04 MED ORDER — ACETAMINOPHEN 325 MG PO TABS
650.0000 mg | ORAL_TABLET | ORAL | Status: DC | PRN
  Administered 2017-03-04: 650 mg via ORAL
  Filled 2017-03-04: qty 2

## 2017-03-04 MED ORDER — CARVEDILOL 12.5 MG PO TABS
25.0000 mg | ORAL_TABLET | Freq: Once | ORAL | Status: AC
  Administered 2017-03-04: 25 mg via ORAL
  Filled 2017-03-04: qty 2

## 2017-03-04 MED ORDER — NEPRO/CARBSTEADY PO LIQD
237.0000 mL | Freq: Two times a day (BID) | ORAL | Status: DC
  Administered 2017-03-05: 237 mL via ORAL
  Filled 2017-03-04 (×2): qty 237

## 2017-03-04 NOTE — ED Provider Notes (Signed)
[facility] DEPT Provider Note   CSN: [id number] Arrival date & time: 03/04/17  1734     History   Chief Complaint Chief Complaint  Patient presents with  . Chest Pain    HPI [name] [name] is a 61 y.o. male hx of CAD, ESRD on HD (last HD today), Diabetes here presenting with chest pain. Patient states that he was in dialysis today and was about 3-1/2 hours in and had sudden onset of chest pain and diaphoresis. Associated with some nausea vomiting at that time. He went home early and took 2 nitros and felt better. Patient then drove here for evaluation. Patient states that he had cardiac stent done in 2009 at [facility]. Didn't take any ASA prior to arrival. Didn't take his BP meds.   The history is provided by the patient.    Past Medical History:  Diagnosis Date  . Anemia   . Anginal pain ([place])   . CAD (coronary artery disease)    a. per [facility] s/p 3.79mm x 68mm Vision BMS to mid LAD 12/2009 and Xience DES to mid LAD 10/2010.  [name] Chronic diastolic CHF (congestive heart failure) ([place])   . Colon polyps   . Daily headache   . ESRD on dialysis [facility]) since ~ 2008   "[place]; TTS" (07/21/2015)  . GERD (gastroesophageal reflux disease)   . Heart murmur   . Hematochezia    a. 2014: colonscopy, which showed moderately-sized internal hemorrhoids, two 30mm polyps in transverse colon and ascending colon that were resected, five 2-79mm polyps in sigmoid colon, descending colon, transverse colon, and ascending colon that were resected. An upper endoscopy was performed and showed normal esophagus, stomach, and duodenum.  . Hematuria    a. H/o hematuria 2014 with cystoscopy that was unrevealing for his source of hematuria. He underwent a kidney ultrasound on 10/14 that showed mildly echogenic and scarred kidneys compatible with medical renal disease, without hydronephrosis or renal calculi.  [name] History of blood transfusion    "had colonoscopy done; they had to give me some blood"  .  Hyperlipidemia   . Hypertension   . On home oxygen therapy    "2L prn" (07/21/2015)  . Renal insufficiency   . 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 Type II diabetes mellitus [facility])     Patient Active Problem List   Diagnosis Date Noted  . Acute respiratory failure with hypoxia ([place]) 02/08/2017  . Chest pain 03/23/2016  . Pulmonary edema 03/18/2016  . ESRD (end stage renal disease) ([place]) 03/18/2016  . Fluid overload 03/18/2016  . End-stage renal disease on hemodialysis ([place])   . Hypertensive heart disease with heart failure ([name])   . Mitral regurgitation   . Hyperkalemia 03/10/2016  . Acute pulmonary edema (HCC)   . Dyspnea 02/19/2016  . Acute on chronic diastolic CHF (congestive heart failure), NYHA class 1 ([place]) 01/02/2016  . ESRD (end stage renal disease) on dialysis ([place]) 01/01/2016  . Elevated troponin   . Emesis 12/23/2015  . Leukocytosis 12/23/2015  . Right sided weakness 10/28/2015  . Diabetic neuropathy ([place]) 07/29/2015  . Depression 07/22/2015  . GERD (gastroesophageal reflux disease) 06/04/2015  . Malnutrition of moderate degree ([place]) 05/17/2015  . Thrombocytopenia ([place]) 05/16/2015  . Weight loss 05/16/2015  . Angina of effort ([place]) 05/16/2015  . H/O TIA (transient ischemic attack) 04/01/2015  . Diastolic dysfunction-grade 2 12/13/2014  . Hypertension 04/27/2014  . CAD -S/P LAD BMS 2011, LAD  DES 2012- patent cors Feb 2016 04/27/2014  . Diabetes mellitus type 2, diet-controlled ([place]) 04/27/2014  . Chest pain, atypical 04/26/2014    Past Surgical History:  Procedure Laterality Date  . AV FISTULA PLACEMENT Left ~ 2007   "upper arm"  . CARDIAC CATHETERIZATION  "several"  . CORONARY ANGIOPLASTY WITH STENT PLACEMENT  "several"  . CYSTOSCOPY W/ STONE MANIPULATION  X2?  . EYE SURGERY Bilateral    "laser OR for hemorrhage"  . LEFT HEART CATHETERIZATION WITH CORONARY ANGIOGRAM N/A 11/23/2014   Procedure: LEFT HEART CATHETERIZATION WITH  CORONARY ANGIOGRAM;  Surgeon: Troy Sine, MD;  Location: [facility] CATH LAB;  Service: Cardiovascular;  Laterality: N/A;  . LITHOTRIPSY  X1       Home Medications    Prior to Admission medications   Medication Sig Start Date End Date Taking? Authorizing Provider  amLODipine (NORVASC) 5 MG tablet Take 1 tablet (5 mg total) by mouth daily. Patient taking differently: Take 10 mg by mouth daily.  02/08/17  Yes Lorella Nimrod, MD  atorvastatin (LIPITOR) 20 MG tablet Take 20 mg by mouth at bedtime. Reported on 03/14/2016   Yes [provider]  carvedilol (COREG) 25 MG tablet Take 1 tablet (25 mg total) by mouth 2 (two) times daily with a meal. 02/08/17  Yes Lorella Nimrod, MD  cinacalcet (SENSIPAR) 90 MG tablet Take 180 mg by mouth every Monday, Wednesday, and Friday with hemodialysis.    Yes [provider]  multivitamin (RENA-VIT) TABS tablet Take 1 tablet by mouth at bedtime. 12/15/14  Yes Vann, Jessica U, DO  pregabalin (LYRICA) 50 MG capsule Take 1 capsule (50 mg total) by mouth daily. Patient taking differently: Take 75 mg by mouth at bedtime.  12/15/14  Yes Eulogio Bear U, DO  sevelamer carbonate (RENVELA) 800 MG tablet Take 4 tablets (3,200 mg total) by mouth 3 (three) times daily with meals. 03/12/16  Yes Vann, Jessica U, DO  albuterol (PROVENTIL HFA;VENTOLIN HFA) 108 (90 Base) MCG/ACT inhaler Inhale 1-2 puffs into the lungs every 6 (six) hours as needed for wheezing or shortness of breath. Patient not taking: Reported on 03/04/2017 01/19/16   Delsa Grana, PA-C  aspirin 81 MG chewable tablet Chew 1 tablet (81 mg total) by mouth daily. Patient not taking: Reported on 03/04/2017 07/22/15   Iline Oven, MD    Family History Family History  Problem Relation Age of Onset  . Bone cancer Mother   . Anuerysm Father   . Hypertension Unknown   . Diabetes type II Daughter     Social History Social History  Substance Use Topics  . Smoking status: Current Every Day Smoker     Packs/day: 0.50    Years: 8.00    Types: Cigarettes    Last attempt to quit: 12/06/2010  . Smokeless tobacco: Never Used  . Alcohol use No     Allergies   Enalapril; Latex; and Tape   Review of Systems Review of Systems  Cardiovascular: Positive for chest pain.  All other systems reviewed and are negative.    Physical Exam Updated Vital Signs BP (!) 158/84   Pulse 90   Temp 98.6 F (37 C) (Oral)   Resp 18   SpO2 94%   Physical Exam  Constitutional: He is oriented to person, place, and time. He appears well-developed.  HENT:  Head: Normocephalic.  Mouth/Throat: Oropharynx is clear and moist.  Eyes: EOM are normal. Pupils are equal, round, and reactive to light.  Neck: Normal range  of motion. Neck supple.  Cardiovascular: Normal rate, regular rhythm and normal heart sounds.   Pulmonary/Chest: Effort normal and breath sounds normal. No respiratory distress. He has no wheezes. He has no rales.  Abdominal: Soft. Bowel sounds are normal. He exhibits no distension. There is no tenderness.  Musculoskeletal: Normal range of motion.  Neurological: He is alert and oriented to person, place, and time. No cranial nerve deficit. Coordination normal.  Skin: Skin is warm.  Psychiatric: He has a normal mood and affect.  Nursing note and vitals reviewed.    ED Treatments / Results  Labs (all labs ordered are listed, but only abnormal results are displayed) Labs Reviewed  BASIC METABOLIC PANEL - Abnormal; Notable for the following:       Result Value   Chloride 95 (*)    Creatinine, Ser 6.46 (*)    Calcium 8.6 (*)    GFR calc non Af Amer 8 (*)    GFR calc Af Amer 10 (*)    All other components within normal limits  CBC - Abnormal; Notable for the following:    RBC 4.18 (*)    Hemoglobin 11.5 (*)    HCT 35.2 (*)    RDW 17.0 (*)    All other components within normal limits  I-STAT TROPOININ, ED    EKG  EKG Interpretation  Date/Time:  Monday Mar 04 2017 17:44:52  EDT Ventricular Rate:  111 PR Interval:  142 QRS Duration: 96 QT Interval:  356 QTC Calculation: 484 R Axis:   80 Text Interpretation:  Sinus tachycardia Minimal voltage criteria for LVH, may be normal variant Borderline ECG tachycardia new since previous  Confirmed by Tamisha Nordstrom  MD, Maebel Marasco ([id number]) on 03/04/2017 6:07:19 PM       Radiology Dg Chest 2 View  Result Date: 03/04/2017 CLINICAL DATA:  Chest pain. EXAM: CHEST  2 VIEW COMPARISON:  Feb 07, 2017 FINDINGS: Mild edema, improved in the interval. Cardiomegaly and coronary artery calcifications. The hila and mediastinum are unchanged. No other acute abnormalities identified. IMPRESSION: Mild edema, improved in the interval. Electronically Signed   By: Dorise Bullion III M.D   On: 03/04/2017 18:30    Procedures Procedures (including critical care time)  Medications Ordered in ED Medications  amLODipine (NORVASC) tablet 5 mg (5 mg Oral Given 03/04/17 1837)  carvedilol (COREG) tablet 25 mg (25 mg Oral Given 03/04/17 1837)  aspirin tablet 325 mg (325 mg Oral Given 03/04/17 1837)     Initial Impression / Assessment and Plan / ED Course  I have reviewed the triage vital signs and the nursing notes.  Pertinent labs & imaging results that were available during my care of the patient were reviewed by me and considered in my medical decision making (see chart for details).     [name] [name] is a 61 y.o. male here with chest pain. Hx of CAD and now pain free after nitros x 2. High risk for ACS. Will get labs, trop, CXR. Will likely need admission for r/o ACS.   7:50 PM  Trop neg x 1. CXR showed mild pulmonary edema, improved compared to recent admission. Internal medicine teaching service to admit.   Final Clinical Impressions(s) / ED Diagnoses   Final diagnoses:  None    New Prescriptions New Prescriptions   No medications on file     Drenda Freeze, MD 03/04/17 [phone]

## 2017-03-04 NOTE — Progress Notes (Signed)
Pt educated about safety and importance of bed alarm during the night however pt refuses to be on bed alarm. Will continue to round on patient.   Niketa Turner, RN    

## 2017-03-04 NOTE — H&P (Signed)
Date: 03/04/2017               Patient Name:  [name] [name] MRN: [id number]  DOB: 1955/12/08 Age / Sex: 61 y.o., male   PCP: [name], MD         Medical Service: Internal Medicine Teaching Service         Attending Physician: Dr. Aldine Contes    First Contact: Dr. Minus Liberty Pager: [phone]  Second Contact: Dr. Ignacia Marvel Pager: [phone]       After Hours (After 5p/  First Contact Pager: [phone]  weekends / holidays): Second Contact Pager: [phone]   Chief Complaint: Chest pain  History of Present Illness: Demontez Novack is a 61 y.o. gentleman with PMH ESRD with HD MWF, CAD (s/p DES to LAD 2009, NM perfusion low risk 10/2015), chronic CHF (EF 45-50% 03/2016, mod MR, severe TR), HTN, HLD, and GERD who presents for episodic chest pain that began during dialysis session this afternoon. He reports intermittent non-radiating, sharp, pressure-like pain across the center of his chest that lasts a few seconds and occurs a few times per hour. He reports not eating anything since yesterday due to lack of appetite. The pain is non pleuritic and he denies dyspnea, diaphoresis, dizziness, reflux symptoms, or abdominal pain. He went home and took two nitroglycerin without symptom relief and decided to drive himself to the ED.  In the ED he was afebrile, HR 113, RR 18, BP 182/90, SpO2 98% on RA. He developed nausea and vomited NBNB emesis once with some symptom relief afterward. EKG showed sinus tach, LVH. CXR with mild edema. Initial troponin 0.01. He received Aspirin 325 mg and IMTS contacted for admission.    Meds:  Current Meds  Medication Sig  . amLODipine (NORVASC) 5 MG tablet Take 1 tablet (5 mg total) by mouth daily. (Patient taking differently: Take 10 mg by mouth daily. )  . atorvastatin (LIPITOR) 20 MG tablet Take 20 mg by mouth at bedtime. Reported on 03/14/2016  . carvedilol (COREG) 25 MG tablet Take 1 tablet (25 mg total) by mouth 2 (two) times daily with a meal.  .  cinacalcet (SENSIPAR) 90 MG tablet Take 180 mg by mouth every Monday, Wednesday, and Friday with hemodialysis.   [name] multivitamin (RENA-VIT) TABS tablet Take 1 tablet by mouth at bedtime.  . pregabalin (LYRICA) 50 MG capsule Take 1 capsule (50 mg total) by mouth daily. (Patient taking differently: Take 75 mg by mouth at bedtime. )  . sevelamer carbonate (RENVELA) 800 MG tablet Take 4 tablets (3,200 mg total) by mouth 3 (three) times daily with meals.    Allergies: Allergies as of 03/04/2017 - Review Complete 03/04/2017  Allergen Reaction Noted  . Enalapril Hives 04/26/2014  . Latex Rash 09/23/2016  . Tape Rash 09/23/2016   Past Medical History:  Diagnosis Date  . Anemia   . Anginal pain ([place])   . CAD (coronary artery disease)    a. per [facility] s/p 3.104mm x 18mm Vision BMS to mid LAD 12/2009 and Xience DES to mid LAD 10/2010.  [name] Chronic diastolic CHF (congestive heart failure) ([place])   . Colon polyps   . Daily headache   . ESRD on dialysis [facility]) since ~ 2008   "[place]; TTS" (07/21/2015)  . GERD (gastroesophageal reflux disease)   . Heart murmur   . Hematochezia    a. 2014: colonscopy, which showed moderately-sized internal hemorrhoids, two 79mm polyps in transverse colon and ascending colon that  were resected, five 2-41mm polyps in sigmoid colon, descending colon, transverse colon, and ascending colon that were resected. An upper endoscopy was performed and showed normal esophagus, stomach, and duodenum.  . Hematuria    a. H/o hematuria 2014 with cystoscopy that was unrevealing for his source of hematuria. He underwent a kidney ultrasound on 10/14 that showed mildly echogenic and scarred kidneys compatible with medical renal disease, without hydronephrosis or renal calculi.  [name] History of blood transfusion    "had colonoscopy done; they had to give me some blood"  . Hyperlipidemia   . Hypertension   . On home oxygen therapy    "2L prn" (07/21/2015)  . Renal insufficiency   .  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 Type II diabetes mellitus (HCC)     Family History:  Family History  Problem Relation Age of Onset  . Bone cancer Mother   . Anuerysm Father   . Hypertension Unknown   . Diabetes type II Daughter     Social History:  Social History   Social History  . Marital status: Divorced    Spouse name: N/A  . Number of children: N/A  . Years of education: N/A   Occupational History  . Not on file.   Social History Main Topics  . Smoking status: Former Smoker    Packs/day: 0.50    Years: 8.00    Types: Cigarettes    Quit date: 12/06/2010  . Smokeless tobacco: Never Used     Comment: quit 02/2017  . Alcohol use No  . Drug use: No  . Sexual activity: Not Currently   Other Topics Concern  . Not on file   Social History Narrative   Moved from [place], [place] to [place] in 11/2014.      Lives with fiancee      Quit smoking 02/2017    Review of Systems: A complete ROS was negative except as per HPI.   Physical Exam: Blood pressure (!) 167/91, pulse 99, temperature 98.6 F (37 C), temperature source Oral, resp. rate (!) 21, SpO2 91 %.  General appearance: Tired-appearing man resting comfortably in bed, in no distress, numerous tattoos, polite and conversational HENT: Normocephalic, atraumatic, moist mucous membranes, poor dentition Eyes: PERRL, non-icteric Cardiovascular: Regular rate and rhythm, systolic murmur best heard over apex, chest wall nontender Respiratory: Clear to auscultation bilaterally, normal work of breathing Abdomen: BS+, soft, non-tender, non-distended Extremities: Normal bulk and range of motion, no edema, 2+ peripheral pulses Skin: Warm, dry, intact, normal skin turgor Neuro: Alert and oriented Psych: Appropriate affect, clear speech, thoughts linear and goal-directed  Assessment & Plan by Problem: Active Problems:   Chest pain  Chest pain, episodic atypical pain since HD this  afternoon, history of CAD with DES in 2009, last NM perf was low risk in 10/2015. Loss of appetite, had N/V x1, no abdominal pain, may be GI-related. S/p Asa 325 mg, intiial trop negative and no EKG changes. -- Serial troponins Q6h overnight -- Repeat AM EKG -- Telemetry -- Trial GI cocktail 1x -- Tylenol PRN for pain -- Nitro PRN for chest pain -- Zofran PRN for nausea -- Check lipase, UDS -- NPO at midnight in case stress test tomorrow  History of CAD, s/p DES to LAD 2009, NM perfusion low risk 10/2015 -- home Aspirin 81 mg  History of CHF, EF 45-50% 03/2016, mod MR, severe TR -- home Coreg 25 mg BID  HTN --  home amlodipine 10 mg daily  Diabetic neuropathy, previous T2DM now diet controlled since weight loss -- home Lyrica 75 mg QHS  ESRD on HD, with bone disease, underwent HD today ended only 30 min early due to symptoms -- cinacalcet MWF with HD -- sevelamer carbonate 3200 mg TID WC  FEN/GI: Renal diet and NPO at midnight, replete electrolytes as needed  DVT ppx: Heparin TID  Code status: Full code  Dispo: Admit patient to Observation with expected length of stay less than 2 midnights.  Signed: Asencion Partridge, MD 03/04/2017, 7:53 PM  Pager: [phone]

## 2017-03-04 NOTE — Progress Notes (Signed)
New Admission Note:   Arrival Method: Bed Mental Orientation: Alert and oriented x4  Telemetry:3ebox 11 Assessment: Completed  Pain:0/10 Tubes: None Safety Measures: Safety Fall Prevention Plan has been discussed  Admission:  [facility] [place] Orientation: Patient has been orientated to the room, unit and staff.  Family: at bedside   Orders to be reviewed and implemented. Will continue to monitor the patient. Call light has been placed within reach, bed alarm refused   Venetia Night, RN Phone: [phone]

## 2017-03-04 NOTE — ED Triage Notes (Signed)
Pt in c/o mid CP with SOB, pt had HD today with tx completed except for the last 30 mins, pt actively vomiting in triage, pt c/o nausea, denies diarrhea, pt A&O x4

## 2017-03-05 ENCOUNTER — Telehealth: Payer: Self-pay

## 2017-03-05 DIAGNOSIS — Z833 Family history of diabetes mellitus: Secondary | ICD-10-CM

## 2017-03-05 DIAGNOSIS — Z992 Dependence on renal dialysis: Secondary | ICD-10-CM

## 2017-03-05 DIAGNOSIS — R0789 Other chest pain: Secondary | ICD-10-CM | POA: Diagnosis not present

## 2017-03-05 DIAGNOSIS — I5033 Acute on chronic diastolic (congestive) heart failure: Secondary | ICD-10-CM | POA: Diagnosis not present

## 2017-03-05 DIAGNOSIS — I2510 Atherosclerotic heart disease of native coronary artery without angina pectoris: Secondary | ICD-10-CM | POA: Diagnosis not present

## 2017-03-05 DIAGNOSIS — Z808 Family history of malignant neoplasm of other organs or systems: Secondary | ICD-10-CM

## 2017-03-05 DIAGNOSIS — E11621 Type 2 diabetes mellitus with foot ulcer: Secondary | ICD-10-CM | POA: Diagnosis not present

## 2017-03-05 DIAGNOSIS — Z87891 Personal history of nicotine dependence: Secondary | ICD-10-CM

## 2017-03-05 DIAGNOSIS — Z91040 Latex allergy status: Secondary | ICD-10-CM

## 2017-03-05 DIAGNOSIS — Z7982 Long term (current) use of aspirin: Secondary | ICD-10-CM

## 2017-03-05 DIAGNOSIS — K219 Gastro-esophageal reflux disease without esophagitis: Secondary | ICD-10-CM

## 2017-03-05 DIAGNOSIS — I071 Rheumatic tricuspid insufficiency: Secondary | ICD-10-CM

## 2017-03-05 DIAGNOSIS — E1140 Type 2 diabetes mellitus with diabetic neuropathy, unspecified: Secondary | ICD-10-CM | POA: Diagnosis not present

## 2017-03-05 DIAGNOSIS — E1142 Type 2 diabetes mellitus with diabetic polyneuropathy: Secondary | ICD-10-CM | POA: Diagnosis not present

## 2017-03-05 DIAGNOSIS — N186 End stage renal disease: Secondary | ICD-10-CM | POA: Diagnosis not present

## 2017-03-05 DIAGNOSIS — E785 Hyperlipidemia, unspecified: Secondary | ICD-10-CM | POA: Diagnosis not present

## 2017-03-05 DIAGNOSIS — Z955 Presence of coronary angioplasty implant and graft: Secondary | ICD-10-CM

## 2017-03-05 DIAGNOSIS — I5022 Chronic systolic (congestive) heart failure: Secondary | ICD-10-CM

## 2017-03-05 DIAGNOSIS — Z91048 Other nonmedicinal substance allergy status: Secondary | ICD-10-CM

## 2017-03-05 DIAGNOSIS — E1122 Type 2 diabetes mellitus with diabetic chronic kidney disease: Secondary | ICD-10-CM | POA: Diagnosis not present

## 2017-03-05 DIAGNOSIS — I340 Nonrheumatic mitral (valve) insufficiency: Secondary | ICD-10-CM

## 2017-03-05 DIAGNOSIS — Z8249 Family history of ischemic heart disease and other diseases of the circulatory system: Secondary | ICD-10-CM

## 2017-03-05 DIAGNOSIS — I132 Hypertensive heart and chronic kidney disease with heart failure and with stage 5 chronic kidney disease, or end stage renal disease: Secondary | ICD-10-CM | POA: Diagnosis not present

## 2017-03-05 DIAGNOSIS — Z888 Allergy status to other drugs, medicaments and biological substances status: Secondary | ICD-10-CM

## 2017-03-05 DIAGNOSIS — Z79899 Other long term (current) drug therapy: Secondary | ICD-10-CM

## 2017-03-05 DIAGNOSIS — L97519 Non-pressure chronic ulcer of other part of right foot with unspecified severity: Secondary | ICD-10-CM | POA: Diagnosis not present

## 2017-03-05 LAB — LIPID PANEL
CHOLESTEROL: 93 mg/dL (ref 0–200)
HDL: 25 mg/dL — ABNORMAL LOW (ref >40)
LDL Cholesterol: 54 mg/dL (ref 0–99)
TRIGLYCERIDES: 69 mg/dL (ref <150)
Total CHOL/HDL Ratio: 3.7 RATIO
VLDL: 14 mg/dL (ref 0–40)

## 2017-03-05 LAB — TROPONIN I
Troponin I: <0.03 ng/mL (ref <0.03)
Troponin I: <0.03 ng/mL (ref <0.03)

## 2017-03-05 MED ORDER — AMLODIPINE BESYLATE 10 MG PO TABS
10.0000 mg | ORAL_TABLET | Freq: Every day | ORAL | 0 refills | Status: DC
Start: 2017-03-06 — End: 2017-06-02

## 2017-03-05 NOTE — Consult Note (Signed)
   [facility] CM Inpatient Consult   03/05/2017  Yeshaya Vath 05-25-1956 [id number]   Patient assessed for Medicare [facility] ACO.  Patient states he currently does not have a primary care provider but suppose to start with Internal Medicine clinic on Thursday with [name], MD, states, "I think."  Denies any current  Community follow up needs. For questions, please contact:  Natividad Brood, RN BSN [facility] Liaison  [phone] business mobile phone Toll free office [phone]

## 2017-03-05 NOTE — Progress Notes (Signed)
   Subjective: No acute events overnight. Feels well this morning with no chest pain since yesterday evening. Wants to go home, would refuse a repeat stress test if recommended, [place] chest pain yesterday was due to gas or heartburn.  Objective:  Vital signs in last 24 hours: Vitals:   03/04/17 2158 03/04/17 2200 03/05/17 0243 03/05/17 0409  BP: (!) 148/85  129/73 122/76  Pulse: 85  79 83  Resp: 20  18 18   Temp: 97.7 F (36.5 C)   97.9 F (36.6 C)  TempSrc: Oral   Oral  SpO2: 100% 100% 100% 97%  Weight: 188 lb 11.2 oz (85.6 kg)   188 lb 3.2 oz (85.4 kg)  Height: 6' (1.829 m)      CBC Latest Ref Rng & Units 03/04/2017 02/08/2017 02/07/2017  WBC 4.0 - 10.5 K/uL 7.7 6.7 -  Hemoglobin 13.0 - 17.0 g/dL 11.5(L) 8.8(L) 10.9(L)  Hematocrit 39.0 - 52.0 % 35.2(L) 27.2(L) 32.0(L)  Platelets 150 - 400 K/uL 158 121(L) -   CMP Latest Ref Rng & Units 03/04/2017 02/08/2017 02/07/2017  Glucose 65 - 99 mg/dL 89 104(H) 96  BUN 6 - 20 mg/dL 16 40(H) 36(H)  Creatinine 0.61 - 1.24 mg/dL 6.46(H) 9.03(H) 8.80(H)  Sodium 135 - 145 mmol/L 138 140 140  Potassium 3.5 - 5.1 mmol/L 3.7 4.8 4.4  Chloride 101 - 111 mmol/L 95(L) 97(L) 97(L)  CO2 22 - 32 mmol/L 29 27 -  Calcium 8.9 - 10.3 mg/dL 8.6(L) 8.5(L) -  Total Protein 6.5 - 8.1 g/dL - - -  Total Bilirubin 0.3 - 1.2 mg/dL - - -  Alkaline Phos 38 - 126 U/L - - -  AST 15 - 41 U/L - - -  ALT 17 - 63 U/L - - -   Cardiac Panel (last 3 results)  Recent Labs  03/04/17 2052 03/05/17 0210 03/05/17 0634  TROPONINI <0.03 <0.03 <0.03   EKG 03/05/2017 NSR, no TWI, ST changes, Q waves.  LVH.  Assessment/Plan:  Principal Problem:   Chest pain, atypical Active Problems:   Hypertension   CAD -S/P LAD BMS 2011, LAD DES 2012- patent cors Feb 2016   Diabetes mellitus type 2, diet-controlled ([place])   ESRD (end stage renal disease) on dialysis ([place])   61 y.o. male with history of coronary artery disease, ESRD, and hypertension who presented with atypical chest pain  which is now resolved. He has ruled out for ACS with negative troponins and a nonischemic EKG and is now asymptomatic and ready for discharge.  #Atypical Chest Pain Resolved. Transient intermittent sharp substernal chest pain without associated dyspnea, diaphoresis, or radiation of atypical of anginal pain. Negative troponins and nonischemic EKG rule out MI.  #CAD -Continue daily aspirin and atorvastatin  #CHF Euvolemic. -Continue home carvedilol  #ESRD Euvolemic, no electrolyte derangements, completed most of his HD session yesterday. On Monday Wednesday Friday HD schedule. -Continue outpatient HD on schedule  #HTN Normotensive to moderately hypertensive -Continue home carvedilol, amlodipine  Fluids: none Diet: renal DVT Prophylaxis: heparin Code Status: full  Dispo: Anticipated discharge today.  Minus Liberty, MD 03/05/2017, 7:44 AM Pager: [phone]

## 2017-03-05 NOTE — Consult Note (Signed)
[facility] Nurse wound consult note Reason for Consult:diabetic ulcer right great toe Wound type:full thickness Pressure Injury POA: n/a Measurement: 2.5cm x 3cm x 0.2cm Wound bed: 10% pale pink, 10% black eschar, 80% dried hard callous Drainage (amount, consistency, odor) scant Periwound: dry, calloused Dressing procedure/placement/frequency: I have provided nurses with orders for NS moistened dressings to debride wound.  Discussed with pt that he needs to see a podiatrist for paring and sanding down of the callous. If you agree please initial podiatry consult upon discharge. We will not follow, but will remain available to this patient, to nursing, and the medical and/or surgical teams.  Please re-consult if we need to assist further.    Fara Olden, RN-C, WTA-C Wound Treatment Associate

## 2017-03-05 NOTE — Telephone Encounter (Signed)
Hospital TOC per Dr Inda Castle, discharge 03/05/2017 appt 03/07/2017 @ 10:15am.

## 2017-03-05 NOTE — Progress Notes (Signed)
Pt has orders to be discharged. Discharge instructions given and pt has no additional questions at this time. Medication regimen reviewed and pt educated. Pt verbalized understanding and has no additional questions. Telemetry box removed. IV removed and site in good condition. Pt stable and waiting for transportation.  Sequoia Mincey RN 

## 2017-03-05 NOTE — Discharge Instructions (Signed)
You were admitted to the hospital with chest pain. We made sure that you're not having a heart attack or any other dangerous causes of chest pain.  Make sure you go to dialysis as regularly scheduled.  Please follow-up the Internal Medicine Center on Thursday.  If you have chest pain again, or shortness of breath, nausea, or pass out, please come back to emergency department.

## 2017-03-05 NOTE — Progress Notes (Signed)
Patient slept during the night. He has been NPO after midnight for possible procedure this morning. Patient has diabetic ulcer on the right foot. Needs WOC nurse consult, will follow up with day shift.  Will continue to monitor.  Paeton Studer, RN

## 2017-03-05 NOTE — Discharge Summary (Signed)
Name: [name] [name] MRN: [id number] DOB: [birth year] 61 y.o. PCP: [name], MD  Date of Admission: 03/04/2017  6:00 PM Date of Discharge: 03/05/2017 Attending Physician: Aldine Contes, MD  Discharge Diagnosis:  Principal Problem:   Chest pain, atypical Active Problems:   Hypertension   CAD -S/P LAD BMS 2011, LAD DES 2012- patent cors Feb 2016   Diabetes mellitus type 2, diet-controlled ([employer])   ESRD (end stage renal disease) on dialysis [facility])   Discharge Medications: Allergies as of 03/05/2017      Reactions   Enalapril Hives   Latex Rash   Tape Rash   TAPE MAKES SKIN BREAK OUT AND TURN RED      Medication List    TAKE these medications   albuterol 108 (90 Base) MCG/ACT inhaler Commonly known as:  PROVENTIL HFA;VENTOLIN HFA Inhale 1-2 puffs into the lungs every 6 (six) hours as needed for wheezing or shortness of breath.   amLODipine 10 MG tablet Commonly known as:  NORVASC Take 1 tablet (10 mg total) by mouth daily. Start taking on:  03/06/2017 What changed:  medication strength  how much to take   aspirin 81 MG chewable tablet Chew 1 tablet (81 mg total) by mouth daily.   atorvastatin 20 MG tablet Commonly known as:  LIPITOR Take 20 mg by mouth at bedtime. Reported on 03/14/2016   carvedilol 25 MG tablet Commonly known as:  COREG Take 1 tablet (25 mg total) by mouth 2 (two) times daily with a meal.   cinacalcet 90 MG tablet Commonly known as:  SENSIPAR Take 180 mg by mouth every Monday, Wednesday, and Friday with hemodialysis.   multivitamin Tabs tablet Take 1 tablet by mouth at bedtime.   pregabalin 50 MG capsule Commonly known as:  LYRICA Take 1 capsule (50 mg total) by mouth daily. What changed:  how much to take  when to take this   sevelamer carbonate 800 MG tablet Commonly known as:  RENVELA Take 4 tablets (3,200 mg total) by mouth 3 (three) times daily with meals.       Disposition and follow-up:   Mr.[name] was  discharged from [facility] in Stable condition.  At the hospital follow up visit please address:  1.  Chest Pain. Ask about further symptoms.  2.  Labs / imaging needed at time of follow-up: none  3.  Pending labs/ test needing follow-up: none  Follow-up Appointments: Follow-up Information    [name], MD. Go on 03/07/2017.   Specialty:  Internal Medicine Why:  at 10:15am Contact information: [place] [id number] [phone]           Hospital Course by problem list: Principal Problem:   Chest pain, atypical Active Problems:   Hypertension   CAD -S/P LAD BMS 2011, LAD DES 2012- patent cors Feb 2016   Diabetes mellitus type 2, diet-controlled ([place])   ESRD (end stage renal disease) on dialysis ([place])   1. Atypical Chest Pain Mr. [name] is a 61 year old man with history of coronary artery disease, ESRD, and hypertension who presented with atypical chest pain. The pain started while at dialysis, and was transient, lasting only a few seconds at a time, sharp, substernal, and without associated dyspnea or diaphoresis. He was admitted for chest pain rule out and troponins remain negative his EKG was without signs of acute ischemia. His symptoms resolved with observation and he was eager to go home. He was discharged home with close outpatient follow-up  and return precautions.  2. ESRD Signed off 30 minutes early from dialysis Monday prior to admission due to chest pain. Euvolemic and without electrolyte abnormalities, he was discharged to continue his regularly scheduled intermittent HD as outpatient.  3. HTN Normotensive to moderately hypertensive, continued on home meds.  Discharge Vitals:   BP (!) 144/84 (BP Location: Right Arm)   Pulse 77   Temp 98 F (36.7 C) (Oral)   Resp 18   Ht 6' (1.829 m)   Wt 188 lb 3.2 oz (85.4 kg) Comment: scale c  SpO2 98%   BMI 25.52 kg/m   Pertinent Labs, Studies, and Procedures:   CBC Latest Ref Rng &  Units 03/04/2017 02/08/2017 02/07/2017  WBC 4.0 - 10.5 K/uL 7.7 6.7 -  Hemoglobin 13.0 - 17.0 g/dL 11.5(L) 8.8(L) 10.9(L)  Hematocrit 39.0 - 52.0 % 35.2(L) 27.2(L) 32.0(L)  Platelets 150 - 400 K/uL 158 121(L) -   CMP Latest Ref Rng & Units 03/04/2017 02/08/2017 02/07/2017  Glucose 65 - 99 mg/dL 89 104(H) 96  BUN 6 - 20 mg/dL 16 40(H) 36(H)  Creatinine 0.61 - 1.24 mg/dL 6.46(H) 9.03(H) 8.80(H)  Sodium 135 - 145 mmol/L 138 140 140  Potassium 3.5 - 5.1 mmol/L 3.7 4.8 4.4  Chloride 101 - 111 mmol/L 95(L) 97(L) 97(L)  CO2 22 - 32 mmol/L 29 27 -  Calcium 8.9 - 10.3 mg/dL 8.6(L) 8.5(L) -  Total Protein 6.5 - 8.1 g/dL - - -  Total Bilirubin 0.3 - 1.2 mg/dL - - -  Alkaline Phos 38 - 126 U/L - - -  AST 15 - 41 U/L - - -  ALT 17 - 63 U/L - - -   Cardiac Panel (last 3 results)  Recent Labs  03/04/17 2052 03/05/17 0210 03/05/17 0634  TROPONINI <0.03 <0.03 <0.03   Lipid Panel     Component Value Date/Time   CHOL 93 03/05/2017 0210   TRIG 69 03/05/2017 0210   HDL 25 (L) 03/05/2017 0210   CHOLHDL 3.7 03/05/2017 0210   VLDL 14 03/05/2017 0210   LDLCALC 54 03/05/2017 0210     Discharge Instructions: Discharge Instructions    Diet - low sodium heart healthy    Complete by:  As directed    Increase activity slowly    Complete by:  As directed      You were admitted to the hospital with chest pain. We made sure that you're not having a heart attack or any other dangerous causes of chest pain.  Make sure you go to dialysis as regularly scheduled.  Please [facility]-up the [facility] on Thursday.  If you have chest pain again, or shortness of breath, nausea, or pass out, please come back to emergency department.  Signed: Minus Liberty, MD 03/05/2017, 1:51 PM   Pager: [phone]

## 2017-03-05 NOTE — Care Management Note (Signed)
Case Management Note  Patient Details  Name: Finis Hendricksen MRN: [id number] Date of Birth: April 30, 1956  Subjective/Objective:   From home ,presents with chest pain.  Discharged , no needs.                 Action/Plan:   Expected Discharge Date:  03/05/17               Expected Discharge Plan:  Home/Self Care  In-House Referral:     Discharge planning Services  CM Consult  Post Acute Care Choice:    Choice offered to:     DME Arranged:    DME Agency:     HH Arranged:    HH Agency:     Status of Service:  Completed, signed off  If discussed at [employer] of Stay Meetings, dates discussed:    Additional Comments:  Zenon Mayo, RN 03/05/2017, 4:25 PM

## 2017-03-05 NOTE — Progress Notes (Signed)
Pt A/O X4, independent and ambulatory. Pt refuses bed alarm.

## 2017-03-06 DIAGNOSIS — N186 End stage renal disease: Secondary | ICD-10-CM | POA: Diagnosis not present

## 2017-03-06 DIAGNOSIS — D631 Anemia in chronic kidney disease: Secondary | ICD-10-CM | POA: Diagnosis not present

## 2017-03-06 DIAGNOSIS — D509 Iron deficiency anemia, unspecified: Secondary | ICD-10-CM | POA: Diagnosis not present

## 2017-03-06 DIAGNOSIS — E1129 Type 2 diabetes mellitus with other diabetic kidney complication: Secondary | ICD-10-CM | POA: Diagnosis not present

## 2017-03-06 DIAGNOSIS — N2581 Secondary hyperparathyroidism of renal origin: Secondary | ICD-10-CM | POA: Diagnosis not present

## 2017-03-06 LAB — HEMOGLOBIN A1C
Hgb A1c MFr Bld: 5 % (ref 4.8–5.6)
MEAN PLASMA GLUCOSE: 97 mg/dL

## 2017-03-06 NOTE — Telephone Encounter (Signed)
Transition Care Management Follow-up Telephone Call   Date discharged? 03/05/2017   How have you been since you were released from the hospital? "I been pretty good"   Do you understand why you were in the hospital? yes   Do you understand the discharge instructions? yes   Where were you discharged to? Home   Items Reviewed:  Medications reviewed: no (pt at dialysis)   Allergies reviewed: yes  Dietary changes reviewed: yes  Referrals reviewed: no   Functional Questionnaire:   Activities of Daily Living (ADLs):   He states they are independent in the following: ambulation, bathing and hygiene, feeding, continence, grooming, toileting and dressing States they require assistance with the following: no assistance needed   Any transportation issues/concerns?: yes-will discuss tomorrow during office visit to see what options are available to patient.    Any patient concerns? no   Confirmed importance and date/time of follow-up visits scheduled yes  Provider Appointment booked with [facility] on 05/31 @ 10:15am  Confirmed with patient if condition begins to worsen call PCP or go to the ER.  Patient was given the office number and encouraged to call back with question or concerns.  : yes

## 2017-03-07 ENCOUNTER — Encounter: Payer: Self-pay | Admitting: Internal Medicine

## 2017-03-07 ENCOUNTER — Ambulatory Visit (INDEPENDENT_AMBULATORY_CARE_PROVIDER_SITE_OTHER): Payer: Medicare Other | Admitting: Internal Medicine

## 2017-03-07 VITALS — BP 121/64 | HR 75 | Temp 98.2°F | Ht 72.0 in | Wt 198.9 lb

## 2017-03-07 DIAGNOSIS — L84 Corns and callosities: Secondary | ICD-10-CM | POA: Diagnosis not present

## 2017-03-07 DIAGNOSIS — R0789 Other chest pain: Secondary | ICD-10-CM

## 2017-03-07 DIAGNOSIS — N186 End stage renal disease: Secondary | ICD-10-CM | POA: Diagnosis not present

## 2017-03-07 DIAGNOSIS — E1122 Type 2 diabetes mellitus with diabetic chronic kidney disease: Secondary | ICD-10-CM | POA: Diagnosis not present

## 2017-03-07 DIAGNOSIS — K219 Gastro-esophageal reflux disease without esophagitis: Secondary | ICD-10-CM | POA: Diagnosis not present

## 2017-03-07 DIAGNOSIS — Z5189 Encounter for other specified aftercare: Secondary | ICD-10-CM

## 2017-03-07 DIAGNOSIS — Z87891 Personal history of nicotine dependence: Secondary | ICD-10-CM

## 2017-03-07 DIAGNOSIS — B351 Tinea unguium: Secondary | ICD-10-CM | POA: Diagnosis not present

## 2017-03-07 DIAGNOSIS — Z992 Dependence on renal dialysis: Secondary | ICD-10-CM | POA: Diagnosis not present

## 2017-03-07 HISTORY — DX: Corns and callosities: L84

## 2017-03-07 MED ORDER — PANTOPRAZOLE SODIUM 40 MG PO TBEC: 40.0000 mg | DELAYED_RELEASE_TABLET | Freq: Every day | ORAL | 2 refills | Status: DC

## 2017-03-07 NOTE — Assessment & Plan Note (Signed)
Has callus on right foot. Will refer to podiatry for debridement.

## 2017-03-07 NOTE — Assessment & Plan Note (Signed)
Continues to have CP which are atypical in description. Not unstable angina.  This could be 2/2 to GERD. Will treat for GERD with PPI.

## 2017-03-07 NOTE — Patient Instructions (Signed)
Take protonix every day 30 mins before breakfast in empty stomach for heartburn.  Will refer you to podiatry   Follow up in 8 weeks.

## 2017-03-07 NOTE — Assessment & Plan Note (Signed)
Patient is having sour taste and chest discomfort which could be from GERD.  Will do protonix 40 mg daily F/up in 8 weeks.

## 2017-03-07 NOTE — Progress Notes (Signed)
CC: HFU for chest pain   HPI:  [name] [name] is a 61 y.o. with PMh as listed below is here for HFU for chest pain  Was admitted 5/28 to 5/29 with atypical CP which started during his dialysis session, lasted only few secs at a time, was sharp, substernal, without any dyspnea or diaphoresis. trops negative, EKG showed no signs of acute ischemia, sx resolved with observation.   He still has brief episodes of the chest pain, sharp, feels like someone is grabbing his heart, no radiation, has occasional brief dyspnea with it but no other symptoms. He feels like it could be GERD as he has sour taste in his mouth. Not on any GERD meds now and would like to try some.  Also has a callus on the right feet which has been bothering him. No discharge, mild irration on the area.   Past Medical History:  Diagnosis Date  . Anemia   . Anginal pain ([place])   . CAD (coronary artery disease)    a. per [facility] s/p 3.78mm x 63mm [facility] BMS to mid LAD 12/2009 and Xience DES to mid LAD 10/2010.  [name] Chronic diastolic CHF (congestive heart failure) ([place])   . Colon polyps   . Daily headache   . ESRD on dialysis [facility]) since ~ 2008   "MWF; Jeneen Rinks" (03/04/2017)  . GERD (gastroesophageal reflux disease)   . Heart murmur   . Hematochezia    a. 2014: colonscopy, which showed moderately-sized internal hemorrhoids, two 4mm polyps in transverse colon and ascending colon that were resected, five 2-54mm polyps in sigmoid colon, descending colon, transverse colon, and ascending colon that were resected. An upper endoscopy was performed and showed normal esophagus, stomach, and duodenum.  . Hematuria    a. H/o hematuria 2014 with cystoscopy that was unrevealing for his source of hematuria. He underwent a kidney ultrasound on 10/14 that showed mildly echogenic and scarred kidneys compatible with medical renal disease, without hydronephrosis or renal calculi.  [name] History of blood transfusion    "had colonoscopy done;  they had to give me some blood"  . Hyperlipidemia   . Hypertension   . On home oxygen therapy    "2L prn" (07/21/2015); "been off it for awhile" (03/04/2017)  . Renal insufficiency   . 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 Type II diabetes mellitus ([place])     Review of Systems:   Review of Systems  Constitutional: Negative for chills and fever.  Respiratory: Negative for cough and sputum production.   Cardiovascular: Positive for chest pain. Negative for palpitations.  Gastrointestinal: Positive for heartburn. Negative for abdominal pain, nausea and vomiting.  Neurological: Negative for dizziness and headaches.     Physical Exam:  Vitals:   03/07/17 1031  BP: 121/64  Pulse: 75  Temp: 98.2 F (36.8 C)  TempSrc: Oral  SpO2: 98%  Weight: 198 lb 14.4 oz (90.2 kg)  Height: 6' (1.829 m)   Physical Exam  Constitutional: He is oriented to person, place, and time. He appears well-developed and well-nourished.  HENT:  Head: Normocephalic and atraumatic.  Eyes: Conjunctivae are normal. Right eye exhibits no discharge. Left eye exhibits no discharge.  Cardiovascular: Normal rate and regular rhythm.  Exam reveals no gallop and no friction rub.   No murmur heard. Respiratory: Effort normal and breath sounds normal. No respiratory distress. He has no wheezes.  GI: Soft. Bowel sounds are normal.  Musculoskeletal:  There is a callus on the plantar surface of his right feet.   Also has onychomycotic thick yellow toenails.   Neurological: He is alert and oriented to person, place, and time.    Assessment & Plan:   See Encounters Tab for problem based charting.  Patient discussed with Dr. Lynnae January

## 2017-03-08 DIAGNOSIS — N186 End stage renal disease: Secondary | ICD-10-CM | POA: Diagnosis not present

## 2017-03-08 DIAGNOSIS — D631 Anemia in chronic kidney disease: Secondary | ICD-10-CM | POA: Diagnosis not present

## 2017-03-08 DIAGNOSIS — D509 Iron deficiency anemia, unspecified: Secondary | ICD-10-CM | POA: Diagnosis not present

## 2017-03-08 DIAGNOSIS — E1129 Type 2 diabetes mellitus with other diabetic kidney complication: Secondary | ICD-10-CM | POA: Diagnosis not present

## 2017-03-08 DIAGNOSIS — N2581 Secondary hyperparathyroidism of renal origin: Secondary | ICD-10-CM | POA: Diagnosis not present

## 2017-03-08 NOTE — Progress Notes (Signed)
[facility] Attending  Case discussed with Dr. Ahmed at the time of the visit.  We reviewed the resident's history and exam and pertinent patient test results.  I agree with the assessment, diagnosis, and plan of care documented in the resident's note. 

## 2017-03-11 DIAGNOSIS — E1129 Type 2 diabetes mellitus with other diabetic kidney complication: Secondary | ICD-10-CM | POA: Diagnosis not present

## 2017-03-11 DIAGNOSIS — D631 Anemia in chronic kidney disease: Secondary | ICD-10-CM | POA: Diagnosis not present

## 2017-03-11 DIAGNOSIS — N186 End stage renal disease: Secondary | ICD-10-CM | POA: Diagnosis not present

## 2017-03-11 DIAGNOSIS — D509 Iron deficiency anemia, unspecified: Secondary | ICD-10-CM | POA: Diagnosis not present

## 2017-03-11 DIAGNOSIS — N2581 Secondary hyperparathyroidism of renal origin: Secondary | ICD-10-CM | POA: Diagnosis not present

## 2017-03-13 DIAGNOSIS — D509 Iron deficiency anemia, unspecified: Secondary | ICD-10-CM | POA: Diagnosis not present

## 2017-03-13 DIAGNOSIS — N186 End stage renal disease: Secondary | ICD-10-CM | POA: Diagnosis not present

## 2017-03-13 DIAGNOSIS — E1129 Type 2 diabetes mellitus with other diabetic kidney complication: Secondary | ICD-10-CM | POA: Diagnosis not present

## 2017-03-13 DIAGNOSIS — N2581 Secondary hyperparathyroidism of renal origin: Secondary | ICD-10-CM | POA: Diagnosis not present

## 2017-03-13 DIAGNOSIS — D631 Anemia in chronic kidney disease: Secondary | ICD-10-CM | POA: Diagnosis not present

## 2017-03-15 DIAGNOSIS — N186 End stage renal disease: Secondary | ICD-10-CM | POA: Diagnosis not present

## 2017-03-15 DIAGNOSIS — N2581 Secondary hyperparathyroidism of renal origin: Secondary | ICD-10-CM | POA: Diagnosis not present

## 2017-03-15 DIAGNOSIS — D509 Iron deficiency anemia, unspecified: Secondary | ICD-10-CM | POA: Diagnosis not present

## 2017-03-15 DIAGNOSIS — D631 Anemia in chronic kidney disease: Secondary | ICD-10-CM | POA: Diagnosis not present

## 2017-03-15 DIAGNOSIS — E1129 Type 2 diabetes mellitus with other diabetic kidney complication: Secondary | ICD-10-CM | POA: Diagnosis not present

## 2017-03-18 ENCOUNTER — Encounter: Payer: Self-pay | Admitting: *Deleted

## 2017-03-18 DIAGNOSIS — N2581 Secondary hyperparathyroidism of renal origin: Secondary | ICD-10-CM | POA: Diagnosis not present

## 2017-03-18 DIAGNOSIS — N186 End stage renal disease: Secondary | ICD-10-CM | POA: Diagnosis not present

## 2017-03-18 DIAGNOSIS — D509 Iron deficiency anemia, unspecified: Secondary | ICD-10-CM | POA: Diagnosis not present

## 2017-03-18 DIAGNOSIS — D631 Anemia in chronic kidney disease: Secondary | ICD-10-CM | POA: Diagnosis not present

## 2017-03-18 DIAGNOSIS — E1129 Type 2 diabetes mellitus with other diabetic kidney complication: Secondary | ICD-10-CM | POA: Diagnosis not present

## 2017-03-20 DIAGNOSIS — N186 End stage renal disease: Secondary | ICD-10-CM | POA: Diagnosis not present

## 2017-03-20 DIAGNOSIS — D631 Anemia in chronic kidney disease: Secondary | ICD-10-CM | POA: Diagnosis not present

## 2017-03-20 DIAGNOSIS — D509 Iron deficiency anemia, unspecified: Secondary | ICD-10-CM | POA: Diagnosis not present

## 2017-03-20 DIAGNOSIS — E1129 Type 2 diabetes mellitus with other diabetic kidney complication: Secondary | ICD-10-CM | POA: Diagnosis not present

## 2017-03-20 DIAGNOSIS — N2581 Secondary hyperparathyroidism of renal origin: Secondary | ICD-10-CM | POA: Diagnosis not present

## 2017-03-22 DIAGNOSIS — E1129 Type 2 diabetes mellitus with other diabetic kidney complication: Secondary | ICD-10-CM | POA: Diagnosis not present

## 2017-03-22 DIAGNOSIS — D631 Anemia in chronic kidney disease: Secondary | ICD-10-CM | POA: Diagnosis not present

## 2017-03-22 DIAGNOSIS — D509 Iron deficiency anemia, unspecified: Secondary | ICD-10-CM | POA: Diagnosis not present

## 2017-03-22 DIAGNOSIS — N186 End stage renal disease: Secondary | ICD-10-CM | POA: Diagnosis not present

## 2017-03-22 DIAGNOSIS — N2581 Secondary hyperparathyroidism of renal origin: Secondary | ICD-10-CM | POA: Diagnosis not present

## 2017-03-25 DIAGNOSIS — D509 Iron deficiency anemia, unspecified: Secondary | ICD-10-CM | POA: Diagnosis not present

## 2017-03-25 DIAGNOSIS — E1129 Type 2 diabetes mellitus with other diabetic kidney complication: Secondary | ICD-10-CM | POA: Diagnosis not present

## 2017-03-25 DIAGNOSIS — N186 End stage renal disease: Secondary | ICD-10-CM | POA: Diagnosis not present

## 2017-03-25 DIAGNOSIS — N2581 Secondary hyperparathyroidism of renal origin: Secondary | ICD-10-CM | POA: Diagnosis not present

## 2017-03-25 DIAGNOSIS — D631 Anemia in chronic kidney disease: Secondary | ICD-10-CM | POA: Diagnosis not present

## 2017-03-26 ENCOUNTER — Ambulatory Visit (INDEPENDENT_AMBULATORY_CARE_PROVIDER_SITE_OTHER): Payer: Medicare Other

## 2017-03-26 ENCOUNTER — Encounter: Payer: Self-pay | Admitting: Sports Medicine

## 2017-03-26 ENCOUNTER — Ambulatory Visit (INDEPENDENT_AMBULATORY_CARE_PROVIDER_SITE_OTHER): Payer: Medicare Other | Admitting: Sports Medicine

## 2017-03-26 DIAGNOSIS — Z992 Dependence on renal dialysis: Secondary | ICD-10-CM

## 2017-03-26 DIAGNOSIS — L97511 Non-pressure chronic ulcer of other part of right foot limited to breakdown of skin: Secondary | ICD-10-CM

## 2017-03-26 DIAGNOSIS — N186 End stage renal disease: Secondary | ICD-10-CM

## 2017-03-26 DIAGNOSIS — M79674 Pain in right toe(s): Secondary | ICD-10-CM

## 2017-03-26 DIAGNOSIS — E1142 Type 2 diabetes mellitus with diabetic polyneuropathy: Secondary | ICD-10-CM

## 2017-03-26 MED ORDER — AMOXICILLIN-POT CLAVULANATE 875-125 MG PO TABS: 1.0000 | ORAL_TABLET | Freq: Two times a day (BID) | ORAL | 0 refills | Status: DC

## 2017-03-26 NOTE — Progress Notes (Signed)
Subjective: [name] [name] is a 61 y.o. male patient seen in office for evaluation of ulceration of the right great toe. Patient has a history of diabetes and a blood glucose level today that has been "good". Patient states that he has been putting betadine  to area, reports a few days ago nurse at kidney said it looked pad so patient returned to using post op shoe. Reports that he never followed up because he thought it was doing better. Denies nausea/fever/vomiting/chills/night sweats/shortness of breath/pain. Patient has no other pedal complaints at this time.  Patient Active Problem List   Diagnosis Date Noted  . Callus of foot 03/07/2017  . Acute respiratory failure with hypoxia ([place]) 02/08/2017  . Pulmonary edema 03/18/2016  . ESRD (end stage renal disease) ([place]) 03/18/2016  . Fluid overload 03/18/2016  . End-stage renal disease on hemodialysis ([place])   . Hypertensive heart disease with heart failure ([place])   . Mitral regurgitation   . Hyperkalemia 03/10/2016  . Acute pulmonary edema (H[facility])   . Dyspnea 02/19/2016  . Acute on chronic diastolic CHF (congestive heart failure), NYHA class 1 ([place]) 01/02/2016  . ESRD (end stage renal disease) on dialysis ([place]) 01/01/2016  . Elevated troponin   . Emesis 12/23/2015  . Leukocytosis 12/23/2015  . Right sided weakness 10/28/2015  . Diabetic neuropathy ([place]) 07/29/2015  . Depression 07/22/2015  . GERD (gastroesophageal reflux disease) 06/04/2015  . Malnutrition of moderate degree ([place]) 05/17/2015  . Thrombocytopenia ([place]) 05/16/2015  . Weight loss 05/16/2015  . Angina of effort ([place]) 05/16/2015  . H/O TIA (transient ischemic attack) 04/01/2015  . Diastolic dysfunction-grade 2 12/13/2014  . Hypertension 04/27/2014  . CAD -S/P LAD BMS 2011, LAD DES 2012- patent cors Feb 2016 04/27/2014  . Diabetes mellitus type 2, diet-controlled ([place]) 04/27/2014  . Chest pain, atypical 04/26/2014   Current Outpatient Prescriptions on File Prior to Visit   Medication Sig Dispense Refill  . albuterol (PROVENTIL HFA;VENTOLIN HFA) 108 (90 Base) MCG/ACT inhaler Inhale 1-2 puffs into the lungs every 6 (six) hours as needed for wheezing or shortness of breath. (Patient not taking: Reported on 03/04/2017) 1 Inhaler 0  . amLODipine (NORVASC) 10 MG tablet Take 1 tablet (10 mg total) by mouth daily. 30 tablet 0  . aspirin 81 MG chewable tablet Chew 1 tablet (81 mg total) by mouth daily. (Patient not taking: Reported on 03/04/2017)    . atorvastatin (LIPITOR) 20 MG tablet Take 20 mg by mouth at bedtime. Reported on 03/14/2016    . carvedilol (COREG) 25 MG tablet Take 1 tablet (25 mg total) by mouth 2 (two) times daily with a meal. 60 tablet 2  . cinacalcet (SENSIPAR) 90 MG tablet Take 180 mg by mouth every Monday, Wednesday, and Friday with hemodialysis.     [name] multivitamin (RENA-VIT) TABS tablet Take 1 tablet by mouth at bedtime. 30 tablet 0  . pantoprazole (PROTONIX) 40 MG tablet Take 1 tablet (40 mg total) by mouth daily. 30 tablet 2  . pregabalin (LYRICA) 50 MG capsule Take 1 capsule (50 mg total) by mouth daily. (Patient taking differently: Take 75 mg by mouth at bedtime. ) 30 capsule 0  . sevelamer carbonate (RENVELA) 800 MG tablet Take 4 tablets (3,200 mg total) by mouth 3 (three) times daily with meals. 320 tablet 0   No current facility-administered medications on file prior to visit.    Allergies  Allergen Reactions  . Enalapril Hives  . Latex Rash  . Tape Rash    TAPE  MAKES SKIN BREAK OUT AND TURN RED    Recent Results (from the past 2160 hour(s))  CBC with Differential     Status: Abnormal   Collection Time: 02/07/17  9:53 PM  Result Value Ref Range   WBC 8.4 4.0 - 10.5 K/uL   RBC 3.65 (L) 4.22 - 5.81 MIL/uL   Hemoglobin 9.6 (L) 13.0 - 17.0 g/dL   HCT 30.9 (L) 39.0 - 52.0 %   MCV 84.7 78.0 - 100.0 fL   MCH 26.3 26.0 - 34.0 pg   MCHC 31.1 30.0 - 36.0 g/dL   RDW 16.1 (H) 11.5 - 15.5 %   Platelets 161 150 - 400 K/uL   Neutrophils Relative  % 81 %   Neutro Abs 6.8 1.7 - 7.7 K/uL   Lymphocytes Relative 9 %   Lymphs Abs 0.7 0.7 - 4.0 K/uL   Monocytes Relative 7 %   Monocytes Absolute 0.6 0.1 - 1.0 K/uL   Eosinophils Relative 3 %   Eosinophils Absolute 0.3 0.0 - 0.7 K/uL   Basophils Relative 0 %   Basophils Absolute 0.0 0.0 - 0.1 K/uL  Comprehensive metabolic panel     Status: Abnormal   Collection Time: 02/07/17  9:53 PM  Result Value Ref Range   Sodium 138 135 - 145 mmol/L   Potassium 4.3 3.5 - 5.1 mmol/L   Chloride 94 (L) 101 - 111 mmol/L   CO2 30 22 - 32 mmol/L   Glucose, Bld 104 (H) 65 - 99 mg/dL   BUN 34 (H) 6 - 20 mg/dL   Creatinine, Ser 8.64 (H) 0.61 - 1.24 mg/dL   Calcium 9.2 8.9 - 10.3 mg/dL   Total Protein 8.4 (H) 6.5 - 8.1 g/dL   Albumin 3.5 3.5 - 5.0 g/dL   AST 13 (L) 15 - 41 U/L   ALT 14 (L) 17 - 63 U/L   Alkaline Phosphatase 58 38 - 126 U/L   Total Bilirubin 1.2 0.3 - 1.2 mg/dL   GFR calc non Af Amer 6 (L) >60 mL/min   GFR calc Af Amer 7 (L) >60 mL/min    Comment: (NOTE) The eGFR has been calculated using the CKD EPI equation. This calculation has not been validated in all clinical situations. eGFR's persistently <60 mL/min signify possible Chronic Kidney Disease.    Anion gap 14 5 - 15  Brain natriuretic peptide     Status: Abnormal   Collection Time: 02/07/17  9:53 PM  Result Value Ref Range   B Natriuretic Peptide 1,116.8 (H) 0.0 - 100.0 pg/mL  I-Stat Troponin, ED (not at [facility])     Status: None   Collection Time: 02/07/17 10:04 PM  Result Value Ref Range   Troponin i, poc 0.02 0.00 - 0.08 ng/mL   Comment 3            Comment: Due to the release kinetics of cTnI, a negative result within the first hours of the onset of symptoms does not rule out myocardial infarction with certainty. If myocardial infarction is still suspected, repeat the test at appropriate intervals.   I-Stat Chem 8, ED     Status: Abnormal   Collection Time: 02/07/17 10:07 PM  Result Value Ref Range   Sodium 140 135  - 145 mmol/L   Potassium 4.4 3.5 - 5.1 mmol/L   Chloride 97 (L) 101 - 111 mmol/L   BUN 36 (H) 6 - 20 mg/dL   Creatinine, Ser 8.80 (H) 0.61 - 1.24 mg/dL   Glucose,  Bld 96 65 - 99 mg/dL   Calcium, Ion 1.00 (L) 1.15 - 1.40 mmol/L   TCO2 32 0 - 100 mmol/L   Hemoglobin 10.9 (L) 13.0 - 17.0 g/dL   HCT 32.0 (L) 39.0 - 52.0 %  Influenza panel by PCR (type A & B)     Status: None   Collection Time: 02/08/17  2:05 AM  Result Value Ref Range   Influenza A By PCR NEGATIVE NEGATIVE   Influenza B By PCR NEGATIVE NEGATIVE    Comment: (NOTE) The Xpert Xpress Flu assay is intended as an aid in the diagnosis of  influenza and should not be used as a sole basis for treatment.  This  assay is FDA approved for nasopharyngeal swab specimens only. Nasal  washings and aspirates are unacceptable for Xpert Xpress Flu testing.   MRSA PCR Screening     Status: None   Collection Time: 02/08/17  4:26 AM  Result Value Ref Range   MRSA by PCR NEGATIVE NEGATIVE    Comment:        The GeneXpert MRSA Assay (FDA approved for NASAL specimens only), is one component of a comprehensive MRSA colonization surveillance program. It is not intended to diagnose MRSA infection nor to guide or monitor treatment for MRSA infections.   Procalcitonin - Baseline     Status: None   Collection Time: 02/08/17  4:46 AM  Result Value Ref Range   Procalcitonin 1.05 ng/mL    Comment:        Interpretation: PCT > 0.5 ng/mL and <= 2 ng/mL: Systemic infection (sepsis) is possible, but other conditions are known to elevate PCT as well. (NOTE)         ICU PCT Algorithm               Non ICU PCT Algorithm    ----------------------------     ------------------------------         PCT < 0.25 ng/mL                 PCT < 0.1 ng/mL     Stopping of antibiotics            Stopping of antibiotics       strongly encouraged.               strongly encouraged.    ----------------------------     ------------------------------       PCT  level decrease by               PCT < 0.25 ng/mL       >= 80% from peak PCT       OR PCT 0.25 - 0.5 ng/mL          Stopping of antibiotics                                             encouraged.     Stopping of antibiotics           encouraged.    ----------------------------     ------------------------------       PCT level decrease by              PCT >= 0.25 ng/mL       < 80% from peak PCT        AND PCT >= 0.5 ng/mL  Continuing antibiotics                                              encouraged.       Continuing antibiotics            encouraged.    ----------------------------     ------------------------------     PCT level increase compared          PCT > 0.5 ng/mL         with peak PCT AND          PCT >= 0.5 ng/mL             Escalation of antibiotics                                          strongly encouraged.      Escalation of antibiotics        strongly encouraged.   HIV antibody (Routine Testing)     Status: None   Collection Time: 02/08/17  4:46 AM  Result Value Ref Range   HIV Screen 4th Generation wRfx Non Reactive Non Reactive    Comment: (NOTE) Performed At: [facility] [place], [place] [id number] Lindon Romp MD [id number]   Basic metabolic panel     Status: Abnormal   Collection Time: 02/08/17  4:46 AM  Result Value Ref Range   Sodium 140 135 - 145 mmol/L   Potassium 4.8 3.5 - 5.1 mmol/L   Chloride 97 (L) 101 - 111 mmol/L   CO2 27 22 - 32 mmol/L   Glucose, Bld 104 (H) 65 - 99 mg/dL   BUN 40 (H) 6 - 20 mg/dL   Creatinine, Ser 9.03 (H) 0.61 - 1.24 mg/dL   Calcium 8.5 (L) 8.9 - 10.3 mg/dL   GFR calc non Af Amer 6 (L) >60 mL/min   GFR calc Af Amer 7 (L) >60 mL/min    Comment: (NOTE) The eGFR has been calculated using the CKD EPI equation. This calculation has not been validated in all clinical situations. eGFR's persistently <60 mL/min signify possible Chronic Kidney Disease.    Anion gap 16 (H) 5 - 15   CBC     Status: Abnormal   Collection Time: 02/08/17  4:46 AM  Result Value Ref Range   WBC 6.7 4.0 - 10.5 K/uL   RBC 3.19 (L) 4.22 - 5.81 MIL/uL   Hemoglobin 8.8 (L) 13.0 - 17.0 g/dL   HCT 27.2 (L) 39.0 - 52.0 %   MCV 85.3 78.0 - 100.0 fL   MCH 27.6 26.0 - 34.0 pg   MCHC 32.4 30.0 - 36.0 g/dL   RDW 16.3 (H) 11.5 - 15.5 %   Platelets 121 (L) 150 - 400 K/uL  Glucose, capillary     Status: Abnormal   Collection Time: 02/08/17  7:57 AM  Result Value Ref Range   Glucose-Capillary 121 (H) 65 - 99 mg/dL  Glucose, capillary     Status: None   Collection Time: 02/08/17 12:19 PM  Result Value Ref Range   Glucose-Capillary 83 65 - 99 mg/dL  Glucose, capillary     Status: None   Collection Time: 02/08/17  6:18 PM  Result Value Ref Range   Glucose-Capillary 98  65 - 99 mg/dL  Basic metabolic panel     Status: Abnormal   Collection Time: 03/04/17  5:55 PM  Result Value Ref Range   Sodium 138 135 - 145 mmol/L   Potassium 3.7 3.5 - 5.1 mmol/L   Chloride 95 (L) 101 - 111 mmol/L   CO2 29 22 - 32 mmol/L   Glucose, Bld 89 65 - 99 mg/dL   BUN 16 6 - 20 mg/dL   Creatinine, Ser 6.46 (H) 0.61 - 1.24 mg/dL   Calcium 8.6 (L) 8.9 - 10.3 mg/dL   GFR calc non Af Amer 8 (L) >60 mL/min   GFR calc Af Amer 10 (L) >60 mL/min    Comment: (NOTE) The eGFR has been calculated using the CKD EPI equation. This calculation has not been validated in all clinical situations. eGFR's persistently <60 mL/min signify possible Chronic Kidney Disease.    Anion gap 14 5 - 15  CBC     Status: Abnormal   Collection Time: 03/04/17  5:55 PM  Result Value Ref Range   WBC 7.7 4.0 - 10.5 K/uL   RBC 4.18 (L) 4.22 - 5.81 MIL/uL   Hemoglobin 11.5 (L) 13.0 - 17.0 g/dL   HCT 35.2 (L) 39.0 - 52.0 %   MCV 84.2 78.0 - 100.0 fL   MCH 27.5 26.0 - 34.0 pg   MCHC 32.7 30.0 - 36.0 g/dL   RDW 17.0 (H) 11.5 - 15.5 %   Platelets 158 150 - 400 K/uL  I-stat troponin, ED     Status: None   Collection Time: 03/04/17  5:59 PM  Result  Value Ref Range   Troponin i, poc 0.01 0.00 - 0.08 ng/mL   Comment 3            Comment: Due to the release kinetics of cTnI, a negative result within the first hours of the onset of symptoms does not rule out myocardial infarction with certainty. If myocardial infarction is still suspected, repeat the test at appropriate intervals.   Hemoglobin A1c     Status: None   Collection Time: 03/04/17  7:59 PM  Result Value Ref Range   Hgb A1c MFr Bld 5.0 4.8 - 5.6 %    Comment: (NOTE)         Pre-diabetes: 5.7 - 6.4         Diabetes: >6.4         Glycemic control for adults with diabetes: <7.0    Mean Plasma Glucose 97 mg/dL    Comment: (NOTE) Performed At: [facility] [address] [place], [place] [id number] Lindon Romp MD [id number]   Troponin I     Status: None   Collection Time: 03/04/17  8:52 PM  Result Value Ref Range   Troponin I <0.03 <0.03 ng/mL  Glucose, capillary     Status: None   Collection Time: 03/04/17 10:04 PM  Result Value Ref Range   Glucose-Capillary 79 65 - 99 mg/dL   Comment 1 Notify RN    Comment 2 Document in Chart   Lipase, blood     Status: None   Collection Time: 03/04/17 10:05 PM  Result Value Ref Range   Lipase 22 11 - 51 U/L  Lipid panel     Status: Abnormal   Collection Time: 03/05/17  2:10 AM  Result Value Ref Range   Cholesterol 93 0 - 200 mg/dL   Triglycerides 69 <150 mg/dL   HDL 25 (L) >40 mg/dL   Total  CHOL/HDL Ratio 3.7 RATIO   VLDL 14 0 - 40 mg/dL   LDL Cholesterol 54 0 - 99 mg/dL    Comment:        Total Cholesterol/HDL:CHD Risk Coronary Heart Disease Risk Table                     Men   Women  1/2 Average Risk   3.4   3.3  Average Risk       5.0   4.4  2 X Average Risk   9.6   7.1  3 X Average Risk  23.4   11.0        Use the calculated Patient Ratio above and the CHD Risk Table to determine the patient's CHD Risk.        ATP III CLASSIFICATION (LDL):  <100     mg/dL   Optimal  100-129  mg/dL   Near or  Above                    Optimal  130-159  mg/dL   Borderline  160-189  mg/dL   High  >190     mg/dL   Very High   Troponin I     Status: None   Collection Time: 03/05/17  2:10 AM  Result Value Ref Range   Troponin I <0.03 <0.03 ng/mL  Troponin I     Status: None   Collection Time: 03/05/17  6:34 AM  Result Value Ref Range   Troponin I <0.03 <0.03 ng/mL    Objective: There were no vitals filed for this visit.  General: Patient is awake, alert, oriented x 3 and in no acute distress.  Dermatology: Skin is warm and dry bilateral with a partial thickness ulceration present right plantar medial hallux with medial fissure. Ulceration measure 1 cm x 0.5 cm x 0.1 cm. There is a keratotic border with a granular base. The ulceration does not probe to bone. There is no malodor, no active drainage, no erythema, no edema. No acute signs of infection.   Vascular: Dorsalis Pedis pulse = 2/4 Bilateral,  Posterior Tibial pulse = 1/4 Bilateral,  Capillary Fill Time < 5 seconds  Neurologic: Protective sensation diminished bilateral.   Musculosketal: No Pain with palpation to ulcerated area. No pain with compression to calves bilateral. No gross bony deformities noted bilateral.  Assessment and Plan:  Problem List Items Addressed This Visit      Endocrine   Diabetic neuropathy ([place])     Genitourinary   ESRD (end stage renal disease) on dialysis [facility])    Other Visit Diagnoses    Skin ulcer of right great toe, limited to breakdown of skin ([place])    -  Primary   Relevant Medications   amoxicillin-clavulanate (AUGMENTIN) 875-125 MG tablet   Other Relevant Orders   DG Foot Complete Right (Completed)   WOUND CULTURE   Great toe pain, right         -Examined patient and discussed the progression of the wound and treatment alternatives. -Xrays reviewed with no acute findings to toe as compared to previous xray - Excisionally dedbrided ulcerations to healthy bleeding borders using a sterile  chisel blade and wound culture obtained and sent to [facility] due to chronicity of ucler and started on Augmentin -Applied PRISMA and dry sterile dressing and instructed patient to continue with daily dressings at home consisting of the same;Advise patient to call office if he runs out before next office  visit; Will order from [facility] if patient needs more -Continue with post op shoe to use daily until ulcer is healed - Advised patient to go to the ER or return to office if the wound worsens or if constitutional symptoms are present. -Patient to return to office in 2 weeks for follow up care and evaluation or sooner if problems arise.  Landis Martins, DPM

## 2017-03-27 DIAGNOSIS — N2581 Secondary hyperparathyroidism of renal origin: Secondary | ICD-10-CM | POA: Diagnosis not present

## 2017-03-27 DIAGNOSIS — E1129 Type 2 diabetes mellitus with other diabetic kidney complication: Secondary | ICD-10-CM | POA: Diagnosis not present

## 2017-03-27 DIAGNOSIS — D631 Anemia in chronic kidney disease: Secondary | ICD-10-CM | POA: Diagnosis not present

## 2017-03-27 DIAGNOSIS — D509 Iron deficiency anemia, unspecified: Secondary | ICD-10-CM | POA: Diagnosis not present

## 2017-03-27 DIAGNOSIS — N186 End stage renal disease: Secondary | ICD-10-CM | POA: Diagnosis not present

## 2017-03-29 ENCOUNTER — Telehealth: Payer: Self-pay | Admitting: *Deleted

## 2017-03-29 DIAGNOSIS — D631 Anemia in chronic kidney disease: Secondary | ICD-10-CM | POA: Diagnosis not present

## 2017-03-29 DIAGNOSIS — D509 Iron deficiency anemia, unspecified: Secondary | ICD-10-CM | POA: Diagnosis not present

## 2017-03-29 DIAGNOSIS — E1129 Type 2 diabetes mellitus with other diabetic kidney complication: Secondary | ICD-10-CM | POA: Diagnosis not present

## 2017-03-29 DIAGNOSIS — N186 End stage renal disease: Secondary | ICD-10-CM | POA: Diagnosis not present

## 2017-03-29 DIAGNOSIS — N2581 Secondary hyperparathyroidism of renal origin: Secondary | ICD-10-CM | POA: Diagnosis not present

## 2017-03-29 LAB — WOUND CULTURE
Gram Stain: NONE SEEN
Gram Stain: NONE SEEN

## 2017-03-29 MED ORDER — SULFAMETHOXAZOLE-TRIMETHOPRIM 800-160 MG PO TABS: 1.0000 | ORAL_TABLET | Freq: Two times a day (BID) | ORAL | 0 refills | Status: DC

## 2017-03-29 NOTE — Telephone Encounter (Addendum)
-----   Message from Landis Martins, [place] sent at 03/29/2017 11:19 AM EDT ----- Can you let patient know that his culture result came back positive for staph and in addition to the Augmentin I rx please also send bactrim 160mg /800mg  BID x 14 days Thanks Dr Chauncey Cruel. I informed pt of Dr. Leeanne Rio orders. Pt asked if he should be worried and I told him to continue as Dr. Cannon Kettle ordered and take the medication and the problem should resolve.

## 2017-04-01 DIAGNOSIS — N186 End stage renal disease: Secondary | ICD-10-CM | POA: Diagnosis not present

## 2017-04-01 DIAGNOSIS — N2581 Secondary hyperparathyroidism of renal origin: Secondary | ICD-10-CM | POA: Diagnosis not present

## 2017-04-01 DIAGNOSIS — D509 Iron deficiency anemia, unspecified: Secondary | ICD-10-CM | POA: Diagnosis not present

## 2017-04-01 DIAGNOSIS — D631 Anemia in chronic kidney disease: Secondary | ICD-10-CM | POA: Diagnosis not present

## 2017-04-01 DIAGNOSIS — E1129 Type 2 diabetes mellitus with other diabetic kidney complication: Secondary | ICD-10-CM | POA: Diagnosis not present

## 2017-04-03 DIAGNOSIS — E1129 Type 2 diabetes mellitus with other diabetic kidney complication: Secondary | ICD-10-CM | POA: Diagnosis not present

## 2017-04-03 DIAGNOSIS — D509 Iron deficiency anemia, unspecified: Secondary | ICD-10-CM | POA: Diagnosis not present

## 2017-04-03 DIAGNOSIS — D631 Anemia in chronic kidney disease: Secondary | ICD-10-CM | POA: Diagnosis not present

## 2017-04-03 DIAGNOSIS — N2581 Secondary hyperparathyroidism of renal origin: Secondary | ICD-10-CM | POA: Diagnosis not present

## 2017-04-03 DIAGNOSIS — N186 End stage renal disease: Secondary | ICD-10-CM | POA: Diagnosis not present

## 2017-04-05 DIAGNOSIS — N186 End stage renal disease: Secondary | ICD-10-CM | POA: Diagnosis not present

## 2017-04-05 DIAGNOSIS — D631 Anemia in chronic kidney disease: Secondary | ICD-10-CM | POA: Diagnosis not present

## 2017-04-05 DIAGNOSIS — D509 Iron deficiency anemia, unspecified: Secondary | ICD-10-CM | POA: Diagnosis not present

## 2017-04-05 DIAGNOSIS — E1129 Type 2 diabetes mellitus with other diabetic kidney complication: Secondary | ICD-10-CM | POA: Diagnosis not present

## 2017-04-05 DIAGNOSIS — N2581 Secondary hyperparathyroidism of renal origin: Secondary | ICD-10-CM | POA: Diagnosis not present

## 2017-04-06 DIAGNOSIS — N186 End stage renal disease: Secondary | ICD-10-CM | POA: Diagnosis not present

## 2017-04-06 DIAGNOSIS — E1122 Type 2 diabetes mellitus with diabetic chronic kidney disease: Secondary | ICD-10-CM | POA: Diagnosis not present

## 2017-04-06 DIAGNOSIS — Z992 Dependence on renal dialysis: Secondary | ICD-10-CM | POA: Diagnosis not present

## 2017-04-08 ENCOUNTER — Encounter: Payer: Self-pay | Admitting: Vascular Surgery

## 2017-04-09 ENCOUNTER — Ambulatory Visit: Payer: Medicare Other

## 2017-04-09 DIAGNOSIS — D509 Iron deficiency anemia, unspecified: Secondary | ICD-10-CM | POA: Diagnosis not present

## 2017-04-09 DIAGNOSIS — N2581 Secondary hyperparathyroidism of renal origin: Secondary | ICD-10-CM | POA: Diagnosis not present

## 2017-04-09 DIAGNOSIS — N186 End stage renal disease: Secondary | ICD-10-CM | POA: Diagnosis not present

## 2017-04-09 DIAGNOSIS — E1129 Type 2 diabetes mellitus with other diabetic kidney complication: Secondary | ICD-10-CM | POA: Diagnosis not present

## 2017-04-12 DIAGNOSIS — N2581 Secondary hyperparathyroidism of renal origin: Secondary | ICD-10-CM | POA: Diagnosis not present

## 2017-04-12 DIAGNOSIS — E1129 Type 2 diabetes mellitus with other diabetic kidney complication: Secondary | ICD-10-CM | POA: Diagnosis not present

## 2017-04-12 DIAGNOSIS — D509 Iron deficiency anemia, unspecified: Secondary | ICD-10-CM | POA: Diagnosis not present

## 2017-04-12 DIAGNOSIS — N186 End stage renal disease: Secondary | ICD-10-CM | POA: Diagnosis not present

## 2017-04-15 DIAGNOSIS — N2581 Secondary hyperparathyroidism of renal origin: Secondary | ICD-10-CM | POA: Diagnosis not present

## 2017-04-15 DIAGNOSIS — D509 Iron deficiency anemia, unspecified: Secondary | ICD-10-CM | POA: Diagnosis not present

## 2017-04-15 DIAGNOSIS — E1129 Type 2 diabetes mellitus with other diabetic kidney complication: Secondary | ICD-10-CM | POA: Diagnosis not present

## 2017-04-15 DIAGNOSIS — N186 End stage renal disease: Secondary | ICD-10-CM | POA: Diagnosis not present

## 2017-04-16 IMAGING — CT CT ABD-PELV W/ CM
2 of 6 series · 16 of 46 positions shown, 18 images · IV contrast (omnipaque)
Comparison: None.

CLINICAL DATA: Acute onset of right upper quadrant abdominal pain
radiating to the umbilicus. Recent dialysis. Nausea and vomiting.
Initial encounter.

EXAM:
CT ABDOMEN AND PELVIS WITH CONTRAST
TECHNIQUE: Multidetector CT imaging of the abdomen and pelvis was performed
using the standard protocol following bolus administration of
intravenous contrast.
CONTRAST:  100mL OMNIPAQUE IOHEXOL 300 MG/ML  SOLN

[Series 2: abd/ pelvis 5.0 i30f 1 · axial · 0.78mm/px · z∈[-363,+87]mm · 13 of 102 slices shown, 15 images]
[im 6/102  soft-tissue]
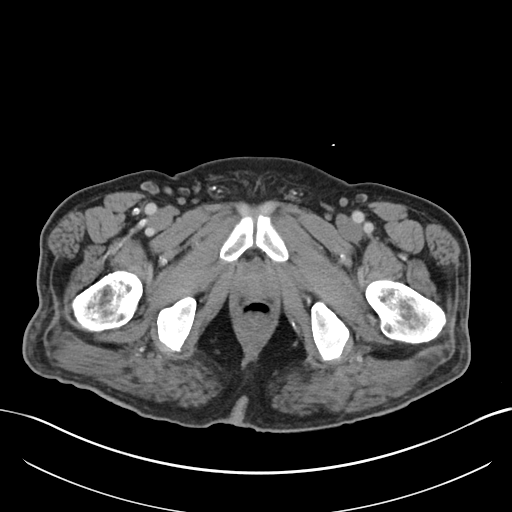
[im 6/102  bone]
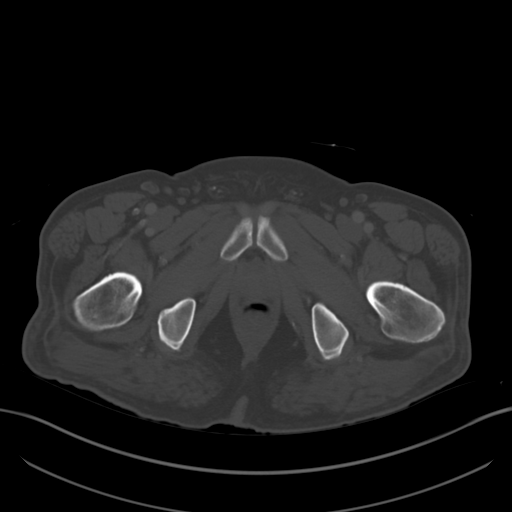
[im 16/102  soft-tissue]
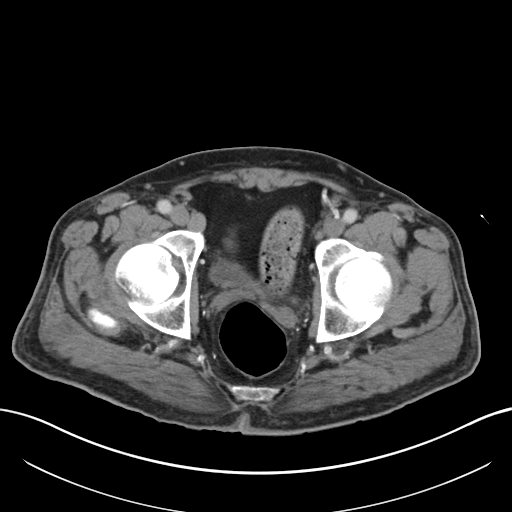
[im 22/102  soft-tissue]
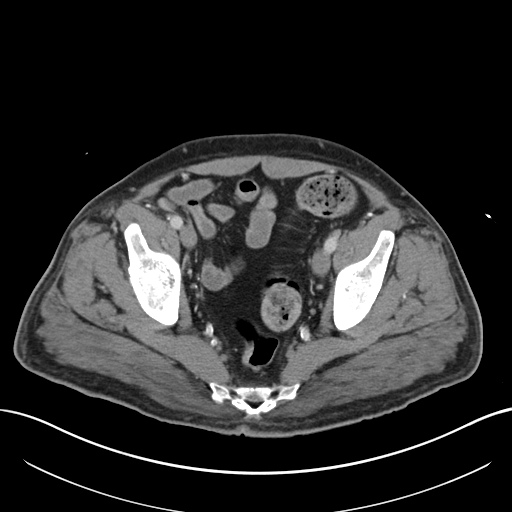
[im 27/102  soft-tissue]
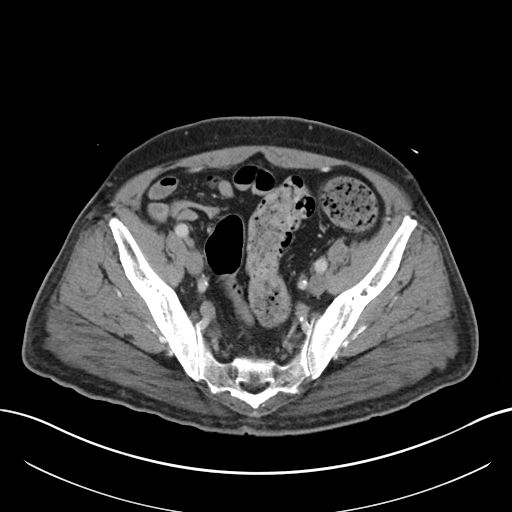
[im 38/102  soft-tissue]
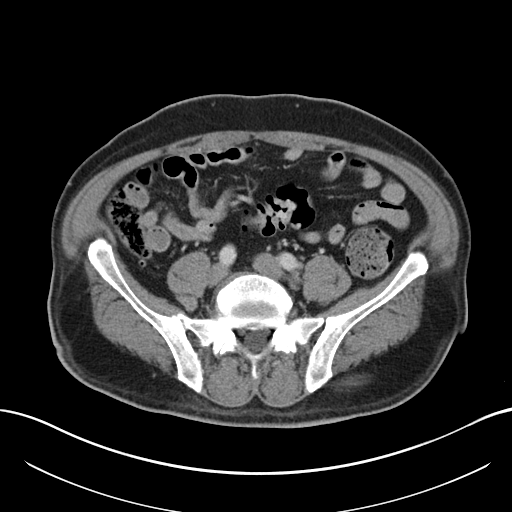
[im 43/102  soft-tissue]
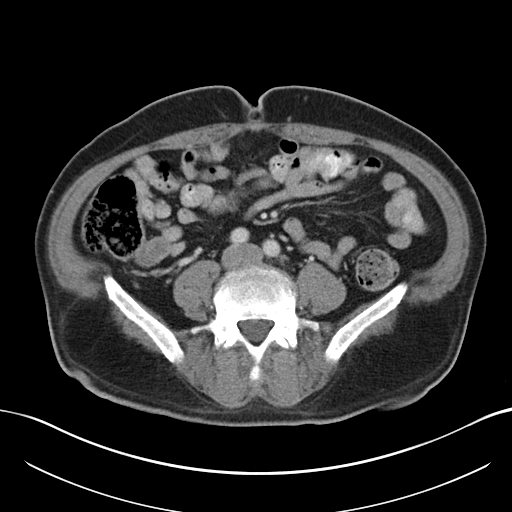
[im 54/102  soft-tissue]
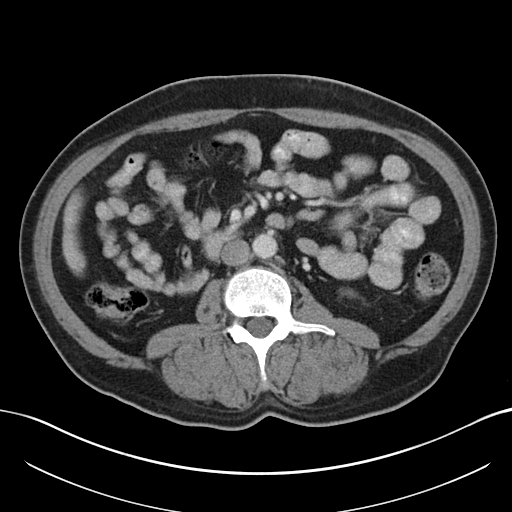
[im 59/102  soft-tissue]
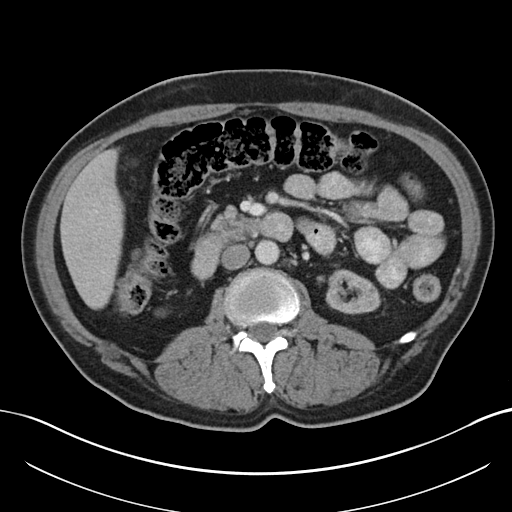
[im 64/102  soft-tissue]
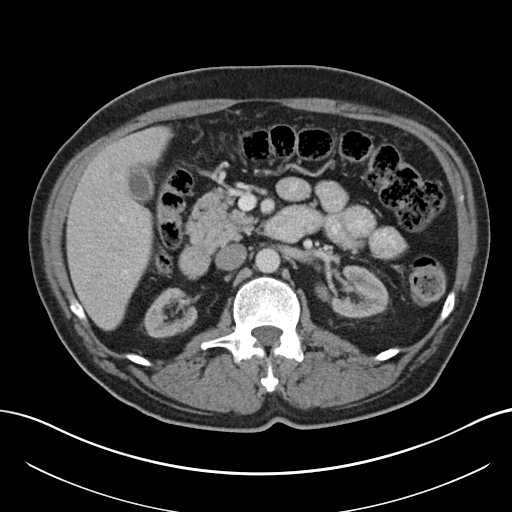
[im 64/102  bone]
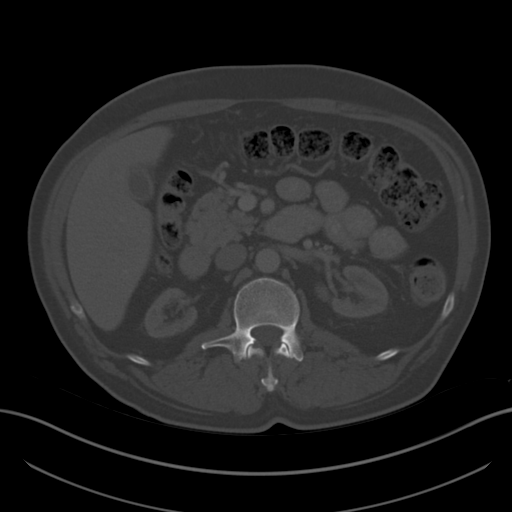
[im 75/102  soft-tissue]
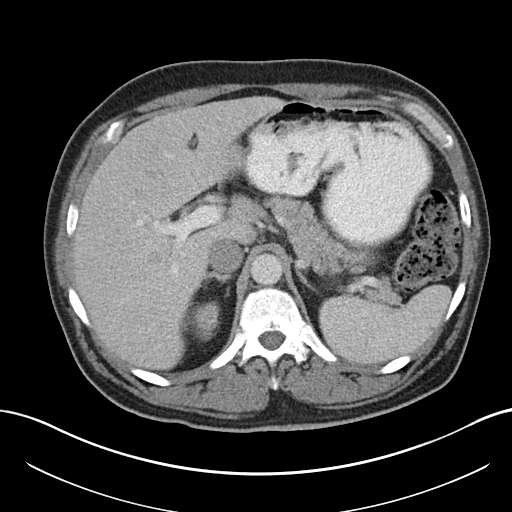
[im 80/102  soft-tissue]
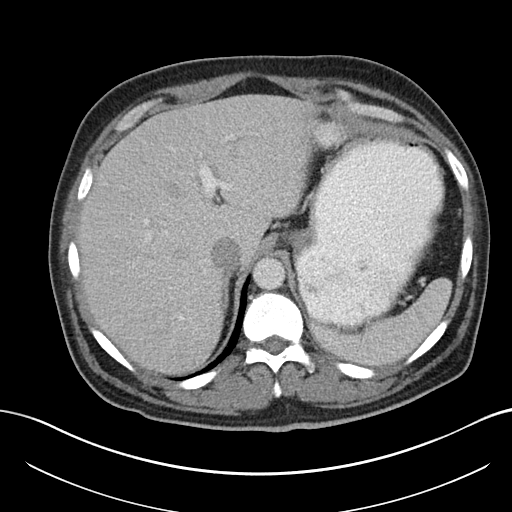
[im 86/102  soft-tissue]
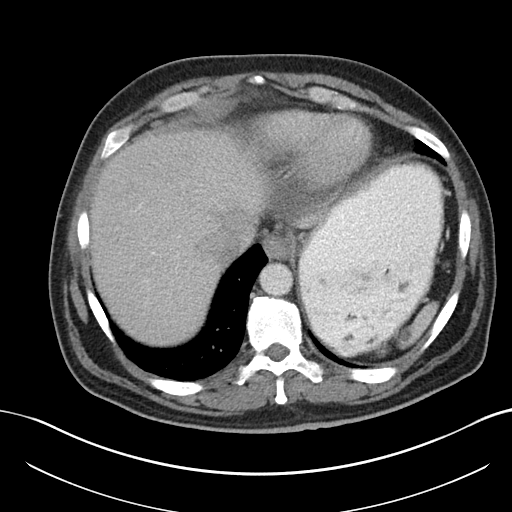
[im 96/102  soft-tissue]
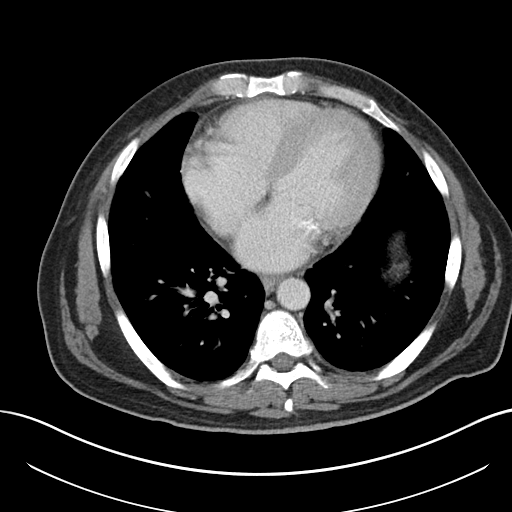

[Series 5: coronal soft tissue · coronal · 0.89mm/px · 3 of 101 slices shown]
[im 34/101  soft-tissue]
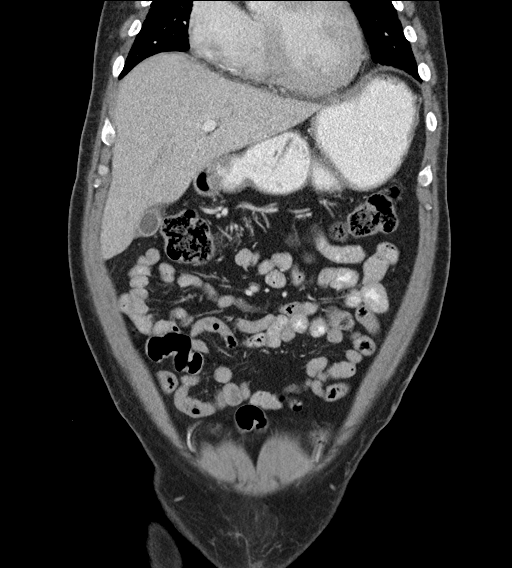
[im 45/101  soft-tissue]
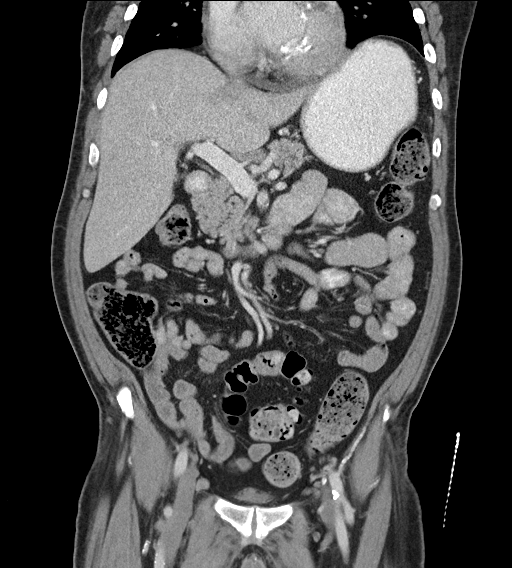
[im 56/101  soft-tissue]
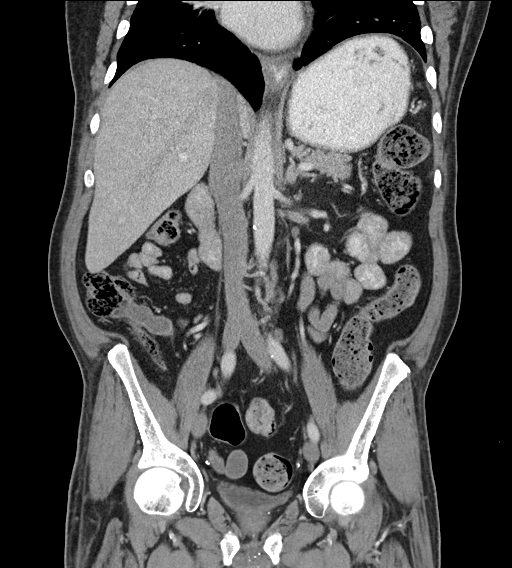

[16 of 46 positions shown; findings below may reference images not displayed]

FINDINGS: A nonspecific mildly mosaic pattern of parenchymal attenuation is
noted at the lung bases. Calcification is noted at the mitral valve.

The liver and spleen are unremarkable in appearance. Mild soft
tissue inflammation is noted about the gallbladder. This could
reflect mild cholecystitis. Would correlate for associated symptoms.
The pancreas and adrenal glands are unremarkable.

Moderate bilateral renal atrophy is noted, with underlying scattered
renal cysts and nonspecific bilateral perinephric stranding. There
is no evidence of hydronephrosis. No renal or ureteral stones are
seen.

No free fluid is identified. The small bowel is unremarkable in
appearance. The stomach is within normal limits. No acute vascular
abnormalities are seen. Mild calcification is noted along the
abdominal aorta and its branches.

The appendix is normal in caliber and contains trace air, without
evidence for appendicitis. The colon is partially filled with stool
and is unremarkable in appearance.

The bladder is decompressed and not well assessed. The prostate is
borderline normal in size. No inguinal lymphadenopathy is seen.

No acute osseous abnormalities are identified.
IMPRESSION: 1. Mild apparent soft tissue inflammation about the gallbladder.
This could reflect mild cholecystitis. Would correlate for
associated symptoms. Right upper quadrant ultrasound could be
considered for further evaluation, as deemed clinically appropriate.
2. Moderate bilateral renal atrophy, with underlying scattered renal
cysts.
3. Nonspecific mildly mosaic pattern of parenchymal attenuation at
the lung bases.
4. Calcification at the mitral valve.
5. Mild calcification along the abdominal aorta and its branches.

## 2017-04-17 ENCOUNTER — Encounter: Payer: Medicare Other | Admitting: Vascular Surgery

## 2017-04-17 DIAGNOSIS — E1129 Type 2 diabetes mellitus with other diabetic kidney complication: Secondary | ICD-10-CM | POA: Diagnosis not present

## 2017-04-17 DIAGNOSIS — N2581 Secondary hyperparathyroidism of renal origin: Secondary | ICD-10-CM | POA: Diagnosis not present

## 2017-04-17 DIAGNOSIS — D509 Iron deficiency anemia, unspecified: Secondary | ICD-10-CM | POA: Diagnosis not present

## 2017-04-17 DIAGNOSIS — N186 End stage renal disease: Secondary | ICD-10-CM | POA: Diagnosis not present

## 2017-04-22 DIAGNOSIS — D509 Iron deficiency anemia, unspecified: Secondary | ICD-10-CM | POA: Diagnosis not present

## 2017-04-22 DIAGNOSIS — E1129 Type 2 diabetes mellitus with other diabetic kidney complication: Secondary | ICD-10-CM | POA: Diagnosis not present

## 2017-04-22 DIAGNOSIS — N2581 Secondary hyperparathyroidism of renal origin: Secondary | ICD-10-CM | POA: Diagnosis not present

## 2017-04-22 DIAGNOSIS — N186 End stage renal disease: Secondary | ICD-10-CM | POA: Diagnosis not present

## 2017-04-24 DIAGNOSIS — N2581 Secondary hyperparathyroidism of renal origin: Secondary | ICD-10-CM | POA: Diagnosis not present

## 2017-04-24 DIAGNOSIS — N186 End stage renal disease: Secondary | ICD-10-CM | POA: Diagnosis not present

## 2017-04-24 DIAGNOSIS — D509 Iron deficiency anemia, unspecified: Secondary | ICD-10-CM | POA: Diagnosis not present

## 2017-04-24 DIAGNOSIS — E1129 Type 2 diabetes mellitus with other diabetic kidney complication: Secondary | ICD-10-CM | POA: Diagnosis not present

## 2017-04-29 DIAGNOSIS — D509 Iron deficiency anemia, unspecified: Secondary | ICD-10-CM | POA: Diagnosis not present

## 2017-04-29 DIAGNOSIS — N186 End stage renal disease: Secondary | ICD-10-CM | POA: Diagnosis not present

## 2017-04-29 DIAGNOSIS — N2581 Secondary hyperparathyroidism of renal origin: Secondary | ICD-10-CM | POA: Diagnosis not present

## 2017-04-29 DIAGNOSIS — E1129 Type 2 diabetes mellitus with other diabetic kidney complication: Secondary | ICD-10-CM | POA: Diagnosis not present

## 2017-04-30 ENCOUNTER — Encounter: Payer: Self-pay | Admitting: Sports Medicine

## 2017-04-30 ENCOUNTER — Ambulatory Visit (INDEPENDENT_AMBULATORY_CARE_PROVIDER_SITE_OTHER): Payer: Medicare Other | Admitting: Sports Medicine

## 2017-04-30 DIAGNOSIS — E1142 Type 2 diabetes mellitus with diabetic polyneuropathy: Secondary | ICD-10-CM

## 2017-04-30 DIAGNOSIS — L97511 Non-pressure chronic ulcer of other part of right foot limited to breakdown of skin: Secondary | ICD-10-CM | POA: Diagnosis not present

## 2017-04-30 DIAGNOSIS — M2041 Other hammer toe(s) (acquired), right foot: Secondary | ICD-10-CM

## 2017-04-30 DIAGNOSIS — Z992 Dependence on renal dialysis: Secondary | ICD-10-CM

## 2017-04-30 DIAGNOSIS — M2042 Other hammer toe(s) (acquired), left foot: Secondary | ICD-10-CM

## 2017-04-30 DIAGNOSIS — M79674 Pain in right toe(s): Secondary | ICD-10-CM

## 2017-04-30 DIAGNOSIS — M2141 Flat foot [pes planus] (acquired), right foot: Secondary | ICD-10-CM

## 2017-04-30 DIAGNOSIS — M2142 Flat foot [pes planus] (acquired), left foot: Secondary | ICD-10-CM

## 2017-04-30 DIAGNOSIS — N186 End stage renal disease: Secondary | ICD-10-CM

## 2017-04-30 NOTE — Patient Instructions (Signed)
Neosporin with Lidocaine to right big toe and cover with bandaid until red area is gone Use toe cushion daily when in shoes

## 2017-04-30 NOTE — Progress Notes (Signed)
Subjective: [name] [name] is a 61 y.o. male patient seen in office for evaluation of ulceration of the right great toe. Patient has a history of diabetes and a blood glucose level today that has been "good". Patient states that he has been putting nothing on it but vasaline to dry areas. Reports that he finished all antibiotics and that the area has dried up. Denies nausea/fever/vomiting/chills/night sweats/shortness of breath/pain. Patient has no other pedal complaints at this time.  Desires diabetic shoes.  Patient Active Problem List   Diagnosis Date Noted  . Callus of foot 03/07/2017  . Acute respiratory failure with hypoxia ([place]) 02/08/2017  . Pulmonary edema 03/18/2016  . ESRD (end stage renal disease) ([place]) 03/18/2016  . Fluid overload 03/18/2016  . End-stage renal disease on hemodialysis ([place])   . Hypertensive heart disease with heart failure ([place])   . Mitral regurgitation   . Hyperkalemia 03/10/2016  . Acute pulmonary edema ([facility])   . Dyspnea 02/19/2016  . Acute on chronic diastolic CHF (congestive heart failure), NYHA class 1 ([place]) 01/02/2016  . ESRD (end stage renal disease) on dialysis ([place]) 01/01/2016  . Elevated troponin   . Emesis 12/23/2015  . Leukocytosis 12/23/2015  . Right sided weakness 10/28/2015  . Diabetic neuropathy ([place]) 07/29/2015  . Depression 07/22/2015  . GERD (gastroesophageal reflux disease) 06/04/2015  . Malnutrition of moderate degree ([place]) 05/17/2015  . Thrombocytopenia ([place]) 05/16/2015  . Weight loss 05/16/2015  . Angina of effort ([place]) 05/16/2015  . H/O TIA (transient ischemic attack) 04/01/2015  . Diastolic dysfunction-grade 2 12/13/2014  . Hypertension 04/27/2014  . CAD -S/P LAD BMS 2011, LAD DES 2012- patent cors Feb 2016 04/27/2014  . Diabetes mellitus type 2, diet-controlled ([place]) 04/27/2014  . Chest pain, atypical 04/26/2014   Current Outpatient Prescriptions on File Prior to Visit  Medication Sig Dispense Refill  . albuterol  (PROVENTIL HFA;VENTOLIN HFA) 108 (90 Base) MCG/ACT inhaler Inhale 1-2 puffs into the lungs every 6 (six) hours as needed for wheezing or shortness of breath. 1 Inhaler 0  . amoxicillin-clavulanate (AUGMENTIN) 875-125 MG tablet Take 1 tablet by mouth 2 (two) times daily. 28 tablet 0  . aspirin 81 MG chewable tablet Chew 1 tablet (81 mg total) by mouth daily.    [name] atorvastatin (LIPITOR) 20 MG tablet Take 20 mg by mouth at bedtime. Reported on 03/14/2016    . carvedilol (COREG) 25 MG tablet Take 1 tablet (25 mg total) by mouth 2 (two) times daily with a meal. 60 tablet 2  . cinacalcet (SENSIPAR) 90 MG tablet Take 180 mg by mouth every Monday, Wednesday, and Friday with hemodialysis.     [name] multivitamin (RENA-VIT) TABS tablet Take 1 tablet by mouth at bedtime. 30 tablet 0  . pantoprazole (PROTONIX) 40 MG tablet Take 1 tablet (40 mg total) by mouth daily. 30 tablet 2  . pregabalin (LYRICA) 50 MG capsule Take 1 capsule (50 mg total) by mouth daily. (Patient taking differently: Take 75 mg by mouth at bedtime. ) 30 capsule 0  . sevelamer carbonate (RENVELA) 800 MG tablet Take 4 tablets (3,200 mg total) by mouth 3 (three) times daily with meals. 320 tablet 0  . sulfamethoxazole-trimethoprim (BACTRIM DS,SEPTRA DS) 800-160 MG tablet Take 1 tablet by mouth 2 (two) times daily. 28 tablet 0  . amLODipine (NORVASC) 10 MG tablet Take 1 tablet (10 mg total) by mouth daily. 30 tablet 0   No current facility-administered medications on file prior to visit.    Allergies  Allergen Reactions  . Enalapril Hives  . Latex Rash  . Tape Rash    TAPE MAKES SKIN BREAK OUT AND TURN RED    Recent Results (from the past 2160 hour(s))  CBC with Differential     Status: Abnormal   Collection Time: 02/07/17  9:53 PM  Result Value Ref Range   WBC 8.4 4.0 - 10.5 K/uL   RBC 3.65 (L) 4.22 - 5.81 MIL/uL   Hemoglobin 9.6 (L) 13.0 - 17.0 g/dL   HCT 30.9 (L) 39.0 - 52.0 %   MCV 84.7 78.0 - 100.0 fL   MCH 26.3 26.0 - 34.0 pg    MCHC 31.1 30.0 - 36.0 g/dL   RDW 16.1 (H) 11.5 - 15.5 %   Platelets 161 150 - 400 K/uL   Neutrophils Relative % 81 %   Neutro Abs 6.8 1.7 - 7.7 K/uL   Lymphocytes Relative 9 %   Lymphs Abs 0.7 0.7 - 4.0 K/uL   Monocytes Relative 7 %   Monocytes Absolute 0.6 0.1 - 1.0 K/uL   Eosinophils Relative 3 %   Eosinophils Absolute 0.3 0.0 - 0.7 K/uL   Basophils Relative 0 %   Basophils Absolute 0.0 0.0 - 0.1 K/uL  Comprehensive metabolic panel     Status: Abnormal   Collection Time: 02/07/17  9:53 PM  Result Value Ref Range   Sodium 138 135 - 145 mmol/L   Potassium 4.3 3.5 - 5.1 mmol/L   Chloride 94 (L) 101 - 111 mmol/L   CO2 30 22 - 32 mmol/L   Glucose, Bld 104 (H) 65 - 99 mg/dL   BUN 34 (H) 6 - 20 mg/dL   Creatinine, Ser 8.64 (H) 0.61 - 1.24 mg/dL   Calcium 9.2 8.9 - 10.3 mg/dL   Total Protein 8.4 (H) 6.5 - 8.1 g/dL   Albumin 3.5 3.5 - 5.0 g/dL   AST 13 (L) 15 - 41 U/L   ALT 14 (L) 17 - 63 U/L   Alkaline Phosphatase 58 38 - 126 U/L   Total Bilirubin 1.2 0.3 - 1.2 mg/dL   GFR calc non Af Amer 6 (L) >60 mL/min   GFR calc Af Amer 7 (L) >60 mL/min    Comment: (NOTE) The eGFR has been calculated using the CKD EPI equation. This calculation has not been validated in all clinical situations. eGFR's persistently <60 mL/min signify possible Chronic Kidney Disease.    Anion gap 14 5 - 15  Brain natriuretic peptide     Status: Abnormal   Collection Time: 02/07/17  9:53 PM  Result Value Ref Range   B Natriuretic Peptide 1,116.8 (H) 0.0 - 100.0 pg/mL  I-Stat Troponin, ED (not at [facility])     Status: None   Collection Time: 02/07/17 10:04 PM  Result Value Ref Range   Troponin i, poc 0.02 0.00 - 0.08 ng/mL   Comment 3            Comment: Due to the release kinetics of cTnI, a negative result within the first hours of the onset of symptoms does not rule out myocardial infarction with certainty. If myocardial infarction is still suspected, repeat the test at appropriate intervals.   I-Stat  Chem 8, ED     Status: Abnormal   Collection Time: 02/07/17 10:07 PM  Result Value Ref Range   Sodium 140 135 - 145 mmol/L   Potassium 4.4 3.5 - 5.1 mmol/L   Chloride 97 (L) 101 - 111 mmol/L   BUN 36 (  H) 6 - 20 mg/dL   Creatinine, Ser 8.80 (H) 0.61 - 1.24 mg/dL   Glucose, Bld 96 65 - 99 mg/dL   Calcium, Ion 1.00 (L) 1.15 - 1.40 mmol/L   TCO2 32 0 - 100 mmol/L   Hemoglobin 10.9 (L) 13.0 - 17.0 g/dL   HCT 32.0 (L) 39.0 - 52.0 %  Influenza panel by PCR (type A & B)     Status: None   Collection Time: 02/08/17  2:05 AM  Result Value Ref Range   Influenza A By PCR NEGATIVE NEGATIVE   Influenza B By PCR NEGATIVE NEGATIVE    Comment: (NOTE) The Xpert Xpress Flu assay is intended as an aid in the diagnosis of  influenza and should not be used as a sole basis for treatment.  This  assay is FDA approved for nasopharyngeal swab specimens only. Nasal  washings and aspirates are unacceptable for Xpert Xpress Flu testing.   MRSA PCR Screening     Status: None   Collection Time: 02/08/17  4:26 AM  Result Value Ref Range   MRSA by PCR NEGATIVE NEGATIVE    Comment:        The GeneXpert MRSA Assay (FDA approved for NASAL specimens only), is one component of a comprehensive MRSA colonization surveillance program. It is not intended to diagnose MRSA infection nor to guide or monitor treatment for MRSA infections.   Procalcitonin - Baseline     Status: None   Collection Time: 02/08/17  4:46 AM  Result Value Ref Range   Procalcitonin 1.05 ng/mL    Comment:        Interpretation: PCT > 0.5 ng/mL and <= 2 ng/mL: Systemic infection (sepsis) is possible, but other conditions are known to elevate PCT as well. (NOTE)         ICU PCT Algorithm               Non ICU PCT Algorithm    ----------------------------     ------------------------------         PCT < 0.25 ng/mL                 PCT < 0.1 ng/mL     Stopping of antibiotics            Stopping of antibiotics       strongly encouraged.                strongly encouraged.    ----------------------------     ------------------------------       PCT level decrease by               PCT < 0.25 ng/mL       >= 80% from peak PCT       OR PCT 0.25 - 0.5 ng/mL          Stopping of antibiotics                                             encouraged.     Stopping of antibiotics           encouraged.    ----------------------------     ------------------------------       PCT level decrease by              PCT >= 0.25 ng/mL       < 80% from  peak PCT        AND PCT >= 0.5 ng/mL             Continuing antibiotics                                              encouraged.       Continuing antibiotics            encouraged.    ----------------------------     ------------------------------     PCT level increase compared          PCT > 0.5 ng/mL         with peak PCT AND          PCT >= 0.5 ng/mL             Escalation of antibiotics                                          strongly encouraged.      Escalation of antibiotics        strongly encouraged.   HIV antibody (Routine Testing)     Status: None   Collection Time: 02/08/17  4:46 AM  Result Value Ref Range   HIV Screen 4th Generation wRfx Non Reactive Non Reactive    Comment: (NOTE) Performed At: [facility] [place], [place] [id number] Lindon Romp MD [id number]   Basic metabolic panel     Status: Abnormal   Collection Time: 02/08/17  4:46 AM  Result Value Ref Range   Sodium 140 135 - 145 mmol/L   Potassium 4.8 3.5 - 5.1 mmol/L   Chloride 97 (L) 101 - 111 mmol/L   CO2 27 22 - 32 mmol/L   Glucose, Bld 104 (H) 65 - 99 mg/dL   BUN 40 (H) 6 - 20 mg/dL   Creatinine, Ser 9.03 (H) 0.61 - 1.24 mg/dL   Calcium 8.5 (L) 8.9 - 10.3 mg/dL   GFR calc non Af Amer 6 (L) >60 mL/min   GFR calc Af Amer 7 (L) >60 mL/min    Comment: (NOTE) The eGFR has been calculated using the CKD EPI equation. This calculation has not been validated in all clinical  situations. eGFR's persistently <60 mL/min signify possible Chronic Kidney Disease.    Anion gap 16 (H) 5 - 15  CBC     Status: Abnormal   Collection Time: 02/08/17  4:46 AM  Result Value Ref Range   WBC 6.7 4.0 - 10.5 K/uL   RBC 3.19 (L) 4.22 - 5.81 MIL/uL   Hemoglobin 8.8 (L) 13.0 - 17.0 g/dL   HCT 27.2 (L) 39.0 - 52.0 %   MCV 85.3 78.0 - 100.0 fL   MCH 27.6 26.0 - 34.0 pg   MCHC 32.4 30.0 - 36.0 g/dL   RDW 16.3 (H) 11.5 - 15.5 %   Platelets 121 (L) 150 - 400 K/uL  Glucose, capillary     Status: Abnormal   Collection Time: 02/08/17  7:57 AM  Result Value Ref Range   Glucose-Capillary 121 (H) 65 - 99 mg/dL  Glucose, capillary     Status: None   Collection Time: 02/08/17 12:19 PM  Result Value Ref Range   Glucose-Capillary 83 65 - 99 mg/dL  Glucose, capillary     Status: None   Collection Time: 02/08/17  6:18 PM  Result Value Ref Range   Glucose-Capillary 98 65 - 99 mg/dL  Basic metabolic panel     Status: Abnormal   Collection Time: 03/04/17  5:55 PM  Result Value Ref Range   Sodium 138 135 - 145 mmol/L   Potassium 3.7 3.5 - 5.1 mmol/L   Chloride 95 (L) 101 - 111 mmol/L   CO2 29 22 - 32 mmol/L   Glucose, Bld 89 65 - 99 mg/dL   BUN 16 6 - 20 mg/dL   Creatinine, Ser 6.46 (H) 0.61 - 1.24 mg/dL   Calcium 8.6 (L) 8.9 - 10.3 mg/dL   GFR calc non Af Amer 8 (L) >60 mL/min   GFR calc Af Amer 10 (L) >60 mL/min    Comment: (NOTE) The eGFR has been calculated using the CKD EPI equation. This calculation has not been validated in all clinical situations. eGFR's persistently <60 mL/min signify possible Chronic Kidney Disease.    Anion gap 14 5 - 15  CBC     Status: Abnormal   Collection Time: 03/04/17  5:55 PM  Result Value Ref Range   WBC 7.7 4.0 - 10.5 K/uL   RBC 4.18 (L) 4.22 - 5.81 MIL/uL   Hemoglobin 11.5 (L) 13.0 - 17.0 g/dL   HCT 35.2 (L) 39.0 - 52.0 %   MCV 84.2 78.0 - 100.0 fL   MCH 27.5 26.0 - 34.0 pg   MCHC 32.7 30.0 - 36.0 g/dL   RDW 17.0 (H) 11.5 - 15.5 %    Platelets 158 150 - 400 K/uL  I-stat troponin, ED     Status: None   Collection Time: 03/04/17  5:59 PM  Result Value Ref Range   Troponin i, poc 0.01 0.00 - 0.08 ng/mL   Comment 3            Comment: Due to the release kinetics of cTnI, a negative result within the first hours of the onset of symptoms does not rule out myocardial infarction with certainty. If myocardial infarction is still suspected, repeat the test at appropriate intervals.   Hemoglobin A1c     Status: None   Collection Time: 03/04/17  7:59 PM  Result Value Ref Range   Hgb A1c MFr Bld 5.0 4.8 - 5.6 %    Comment: (NOTE)         Pre-diabetes: 5.7 - 6.4         Diabetes: >6.4         Glycemic control for adults with diabetes: <7.0    Mean Plasma Glucose 97 mg/dL    Comment: (NOTE) Performed At: [facility] [address] [place], [place] [id number] Lindon Romp MD [id number]   Troponin I     Status: None   Collection Time: 03/04/17  8:52 PM  Result Value Ref Range   Troponin I <0.03 <0.03 ng/mL  Glucose, capillary     Status: None   Collection Time: 03/04/17 10:04 PM  Result Value Ref Range   Glucose-Capillary 79 65 - 99 mg/dL   Comment 1 Notify RN    Comment 2 Document in Chart   Lipase, blood     Status: None   Collection Time: 03/04/17 10:05 PM  Result Value Ref Range   Lipase 22 11 - 51 U/L  Lipid panel     Status: Abnormal   Collection Time: 03/05/17  2:10 AM  Result Value Ref  Range   Cholesterol 93 0 - 200 mg/dL   Triglycerides 69 <150 mg/dL   HDL 25 (L) >40 mg/dL   Total CHOL/HDL Ratio 3.7 RATIO   VLDL 14 0 - 40 mg/dL   LDL Cholesterol 54 0 - 99 mg/dL    Comment:        Total Cholesterol/HDL:CHD Risk Coronary Heart Disease Risk Table                     Men   Women  1/2 Average Risk   3.4   3.3  Average Risk       5.0   4.4  2 X Average Risk   9.6   7.1  3 X Average Risk  23.4   11.0        Use the calculated Patient Ratio above and the CHD Risk Table to determine  the patient's CHD Risk.        ATP III CLASSIFICATION (LDL):  <100     mg/dL   Optimal  100-129  mg/dL   Near or Above                    Optimal  130-159  mg/dL   Borderline  160-189  mg/dL   High  >190     mg/dL   Very High   Troponin I     Status: None   Collection Time: 03/05/17  2:10 AM  Result Value Ref Range   Troponin I <0.03 <0.03 ng/mL  Troponin I     Status: None   Collection Time: 03/05/17  6:34 AM  Result Value Ref Range   Troponin I <0.03 <0.03 ng/mL  WOUND CULTURE     Status: None   Collection Time: 03/26/17  3:24 PM  Result Value Ref Range   Culture Abundant STAPHYLOCOCCUS AUREUS    Gram Stain No WBC Seen    Gram Stain No Squamous Epithelial Cells Seen    Gram Stain Few Gram Positive Cocci In Pairs In Clusters    Organism ID, Bacteria STAPHYLOCOCCUS AUREUS     Comment: Rifampin and Gentamicin should not be used as single drugs for treatment of Staph infections.       Susceptibility   Staphylococcus aureus -  (no method available)    OXACILLIN <=0.25 Sensitive     CEFAZOLIN  Sensitive     GENTAMICIN <=0.5 Sensitive     CIPROFLOXACIN <=0.5 Sensitive     LEVOFLOXACIN 0.25 Sensitive     MOXIFLOXACIN <=0.25 Sensitive     TRIMETH/SULFA <=10 Sensitive     VANCOMYCIN <=0.5 Sensitive     CLINDAMYCIN <=0.25 Sensitive     ERYTHROMYCIN <=0.25 Sensitive     LINEZOLID 2 Sensitive     QUINUPRISTIN/DALF <=0.25 Sensitive     RIFAMPIN <=0.5 Sensitive     TETRACYCLINE <=1 Sensitive     Objective: There were no vitals filed for this visit.  General: Patient is awake, alert, oriented x 3 and in no acute distress.  Dermatology: Skin is warm and dry bilateral with a now healed ulceration present right plantar medial hallux with medial fissure.; no draingae, no erythema, no edema. No acute signs of infection.   Vascular: Dorsalis Pedis pulse = 2/4 Bilateral,  Posterior Tibial pulse = 1/4 Bilateral,  Capillary Fill Time < 5 seconds  Neurologic: Protective sensation  diminished bilateral.   Musculosketal: No Pain with palpation to the healed ulceration area now pre-ulcerative callus/fissure. No pain with compression  to calves bilateral. Asymptomatic pes planus and mild lesser hammertoes.  Assessment and Plan:  Problem List Items Addressed This Visit      Endocrine   Diabetic neuropathy ([place])     Genitourinary   ESRD (end stage renal disease) on dialysis [facility])    Other Visit Diagnoses    Skin ulcer of right great toe, limited to breakdown of skin ([place])    -  Primary   prematurely healed   Great toe pain, right       Pes planus of both feet       Hammer toes of both feet         -Examined patient and discussed the progression of the healed wound with fissure -Recommend topical neosporin with lidocaine to help with pain and to prevent pre-ulcerative/fissue infection covered with bandaid daily -Dispensed toe padding - Advised patient to go to the ER or return to office if the wound recurs or if constitutional symptoms are present. -Safe step diabetic shoe order form was completed; office to contact primary care for approval / certification;  Foam impression were obtained this office visit; Office to arrange dispensing. -Patient to return to office in 4 weeks for follow up care/check final healing of right great toe and evaluation or sooner if problems arise.  Landis Martins, DPM

## 2017-05-01 DIAGNOSIS — E1129 Type 2 diabetes mellitus with other diabetic kidney complication: Secondary | ICD-10-CM | POA: Diagnosis not present

## 2017-05-01 DIAGNOSIS — N186 End stage renal disease: Secondary | ICD-10-CM | POA: Diagnosis not present

## 2017-05-01 DIAGNOSIS — N2581 Secondary hyperparathyroidism of renal origin: Secondary | ICD-10-CM | POA: Diagnosis not present

## 2017-05-01 DIAGNOSIS — D509 Iron deficiency anemia, unspecified: Secondary | ICD-10-CM | POA: Diagnosis not present

## 2017-05-03 DIAGNOSIS — E1129 Type 2 diabetes mellitus with other diabetic kidney complication: Secondary | ICD-10-CM | POA: Diagnosis not present

## 2017-05-03 DIAGNOSIS — N186 End stage renal disease: Secondary | ICD-10-CM | POA: Diagnosis not present

## 2017-05-03 DIAGNOSIS — D509 Iron deficiency anemia, unspecified: Secondary | ICD-10-CM | POA: Diagnosis not present

## 2017-05-03 DIAGNOSIS — N2581 Secondary hyperparathyroidism of renal origin: Secondary | ICD-10-CM | POA: Diagnosis not present

## 2017-05-06 DIAGNOSIS — N2581 Secondary hyperparathyroidism of renal origin: Secondary | ICD-10-CM | POA: Diagnosis not present

## 2017-05-06 DIAGNOSIS — D509 Iron deficiency anemia, unspecified: Secondary | ICD-10-CM | POA: Diagnosis not present

## 2017-05-06 DIAGNOSIS — E1129 Type 2 diabetes mellitus with other diabetic kidney complication: Secondary | ICD-10-CM | POA: Diagnosis not present

## 2017-05-06 DIAGNOSIS — N186 End stage renal disease: Secondary | ICD-10-CM | POA: Diagnosis not present

## 2017-05-07 DIAGNOSIS — E1122 Type 2 diabetes mellitus with diabetic chronic kidney disease: Secondary | ICD-10-CM | POA: Diagnosis not present

## 2017-05-07 DIAGNOSIS — Z992 Dependence on renal dialysis: Secondary | ICD-10-CM | POA: Diagnosis not present

## 2017-05-07 DIAGNOSIS — N186 End stage renal disease: Secondary | ICD-10-CM | POA: Diagnosis not present

## 2017-05-08 DIAGNOSIS — D509 Iron deficiency anemia, unspecified: Secondary | ICD-10-CM | POA: Diagnosis not present

## 2017-05-08 DIAGNOSIS — D631 Anemia in chronic kidney disease: Secondary | ICD-10-CM | POA: Diagnosis not present

## 2017-05-08 DIAGNOSIS — E1129 Type 2 diabetes mellitus with other diabetic kidney complication: Secondary | ICD-10-CM | POA: Diagnosis not present

## 2017-05-08 DIAGNOSIS — N2581 Secondary hyperparathyroidism of renal origin: Secondary | ICD-10-CM | POA: Diagnosis not present

## 2017-05-08 DIAGNOSIS — N186 End stage renal disease: Secondary | ICD-10-CM | POA: Diagnosis not present

## 2017-05-10 DIAGNOSIS — N2581 Secondary hyperparathyroidism of renal origin: Secondary | ICD-10-CM | POA: Diagnosis not present

## 2017-05-10 DIAGNOSIS — N186 End stage renal disease: Secondary | ICD-10-CM | POA: Diagnosis not present

## 2017-05-10 DIAGNOSIS — D631 Anemia in chronic kidney disease: Secondary | ICD-10-CM | POA: Diagnosis not present

## 2017-05-10 DIAGNOSIS — E1129 Type 2 diabetes mellitus with other diabetic kidney complication: Secondary | ICD-10-CM | POA: Diagnosis not present

## 2017-05-10 DIAGNOSIS — D509 Iron deficiency anemia, unspecified: Secondary | ICD-10-CM | POA: Diagnosis not present

## 2017-05-13 DIAGNOSIS — E1129 Type 2 diabetes mellitus with other diabetic kidney complication: Secondary | ICD-10-CM | POA: Diagnosis not present

## 2017-05-13 DIAGNOSIS — D631 Anemia in chronic kidney disease: Secondary | ICD-10-CM | POA: Diagnosis not present

## 2017-05-13 DIAGNOSIS — D509 Iron deficiency anemia, unspecified: Secondary | ICD-10-CM | POA: Diagnosis not present

## 2017-05-13 DIAGNOSIS — N2581 Secondary hyperparathyroidism of renal origin: Secondary | ICD-10-CM | POA: Diagnosis not present

## 2017-05-13 DIAGNOSIS — N186 End stage renal disease: Secondary | ICD-10-CM | POA: Diagnosis not present

## 2017-05-15 DIAGNOSIS — D509 Iron deficiency anemia, unspecified: Secondary | ICD-10-CM | POA: Diagnosis not present

## 2017-05-15 DIAGNOSIS — D631 Anemia in chronic kidney disease: Secondary | ICD-10-CM | POA: Diagnosis not present

## 2017-05-15 DIAGNOSIS — E1129 Type 2 diabetes mellitus with other diabetic kidney complication: Secondary | ICD-10-CM | POA: Diagnosis not present

## 2017-05-15 DIAGNOSIS — N2581 Secondary hyperparathyroidism of renal origin: Secondary | ICD-10-CM | POA: Diagnosis not present

## 2017-05-15 DIAGNOSIS — N186 End stage renal disease: Secondary | ICD-10-CM | POA: Diagnosis not present

## 2017-05-16 ENCOUNTER — Encounter (HOSPITAL_COMMUNITY): Payer: Self-pay | Admitting: Emergency Medicine

## 2017-05-16 ENCOUNTER — Ambulatory Visit (HOSPITAL_COMMUNITY)
Admission: EM | Admit: 2017-05-16 | Discharge: 2017-05-16 | Disposition: A | Discharge status: Home / Self Care | Payer: Medicare Other | Attending: Internal Medicine | Admitting: Internal Medicine

## 2017-05-16 DIAGNOSIS — M549 Dorsalgia, unspecified: Secondary | ICD-10-CM

## 2017-05-16 MED ORDER — CYCLOBENZAPRINE HCL 10 MG PO TABS: 10.0000 mg | ORAL_TABLET | Freq: Two times a day (BID) | ORAL | 0 refills | Status: DC | PRN

## 2017-05-16 NOTE — ED Provider Notes (Signed)
  [place]   [phone] 05/16/17 Arrival Time: 8841  ASSESSMENT & PLAN:  1. Upper back pain     Meds ordered this encounter  Medications  . cyclobenzaprine (FLEXERIL) 10 MG tablet    Sig: Take 1 tablet (10 mg total) by mouth 2 (two) times daily as needed for muscle spasms.    Dispense:  20 tablet    Refill:  0    Order Specific Question:   Supervising Provider    Answer:   Sherlene Shams [[id number]]    Reviewed expectations re: course of current medical issues. Questions answered. Outlined signs and symptoms indicating need for more acute intervention. Patient verbalized understanding. After Visit Summary given.   SUBJECTIVE:  [name] [name] is a 61 y.o. male who presents with complaint of right upper back and neck pain and headache.  ROS: As per HPI.   OBJECTIVE:  Vitals:   05/16/17 1514  BP: (!) 168/85  Pulse: 76  Resp: 18  Temp: 98.5 F (36.9 C)  TempSrc: Oral  SpO2: 97%     General appearance: alert; no distress HEENT: normocephalic; atraumatic; conjunctivae normal; TMs normal; nasal mucosa normal; oral mucosa normal Neck: supple Lungs: clear to auscultation bilaterally Heart: regular rate and rhythm Abdomen: soft, non-tender; bowel sounds normal; no masses or organomegaly; no guarding or rebound tenderness Back: TTP right upper thoracic paraspinous muscles.    Labs Reviewed - No data to display  No results found.  Allergies  Allergen Reactions  . Enalapril Hives  . Latex Rash  . Tape Rash    TAPE MAKES SKIN BREAK OUT AND TURN RED    PMHx, SurgHx, SocialHx, Medications, and Allergies were reviewed in the Visit Navigator and updated as appropriate.      Lysbeth Penner, [place] 05/16/17 1642

## 2017-05-16 NOTE — ED Triage Notes (Signed)
Right upper back sore ness.  Patient has been sen in ed for this before this episode.  No known injury.

## 2017-05-17 DIAGNOSIS — D631 Anemia in chronic kidney disease: Secondary | ICD-10-CM | POA: Diagnosis not present

## 2017-05-17 DIAGNOSIS — D509 Iron deficiency anemia, unspecified: Secondary | ICD-10-CM | POA: Diagnosis not present

## 2017-05-17 DIAGNOSIS — N2581 Secondary hyperparathyroidism of renal origin: Secondary | ICD-10-CM | POA: Diagnosis not present

## 2017-05-17 DIAGNOSIS — E1129 Type 2 diabetes mellitus with other diabetic kidney complication: Secondary | ICD-10-CM | POA: Diagnosis not present

## 2017-05-17 DIAGNOSIS — N186 End stage renal disease: Secondary | ICD-10-CM | POA: Diagnosis not present

## 2017-05-20 DIAGNOSIS — D509 Iron deficiency anemia, unspecified: Secondary | ICD-10-CM | POA: Diagnosis not present

## 2017-05-20 DIAGNOSIS — N186 End stage renal disease: Secondary | ICD-10-CM | POA: Diagnosis not present

## 2017-05-20 DIAGNOSIS — D631 Anemia in chronic kidney disease: Secondary | ICD-10-CM | POA: Diagnosis not present

## 2017-05-20 DIAGNOSIS — E1129 Type 2 diabetes mellitus with other diabetic kidney complication: Secondary | ICD-10-CM | POA: Diagnosis not present

## 2017-05-20 DIAGNOSIS — N2581 Secondary hyperparathyroidism of renal origin: Secondary | ICD-10-CM | POA: Diagnosis not present

## 2017-05-22 DIAGNOSIS — D631 Anemia in chronic kidney disease: Secondary | ICD-10-CM | POA: Diagnosis not present

## 2017-05-22 DIAGNOSIS — N186 End stage renal disease: Secondary | ICD-10-CM | POA: Diagnosis not present

## 2017-05-22 DIAGNOSIS — N2581 Secondary hyperparathyroidism of renal origin: Secondary | ICD-10-CM | POA: Diagnosis not present

## 2017-05-22 DIAGNOSIS — E1129 Type 2 diabetes mellitus with other diabetic kidney complication: Secondary | ICD-10-CM | POA: Diagnosis not present

## 2017-05-22 DIAGNOSIS — D509 Iron deficiency anemia, unspecified: Secondary | ICD-10-CM | POA: Diagnosis not present

## 2017-05-24 ENCOUNTER — Telehealth: Payer: Self-pay | Admitting: Sports Medicine

## 2017-05-24 DIAGNOSIS — D509 Iron deficiency anemia, unspecified: Secondary | ICD-10-CM | POA: Diagnosis not present

## 2017-05-24 DIAGNOSIS — E1129 Type 2 diabetes mellitus with other diabetic kidney complication: Secondary | ICD-10-CM | POA: Diagnosis not present

## 2017-05-24 DIAGNOSIS — N186 End stage renal disease: Secondary | ICD-10-CM | POA: Diagnosis not present

## 2017-05-24 DIAGNOSIS — D631 Anemia in chronic kidney disease: Secondary | ICD-10-CM | POA: Diagnosis not present

## 2017-05-24 DIAGNOSIS — N2581 Secondary hyperparathyroidism of renal origin: Secondary | ICD-10-CM | POA: Diagnosis not present

## 2017-05-24 NOTE — Telephone Encounter (Signed)
Ok thanks. If he wants diabetic shoes he has to get the name of the treating Doctor to [place]. -Dr. Cannon Kettle

## 2017-05-24 NOTE — Telephone Encounter (Signed)
Called pt to discuss his diabetic shoes, Dr Trilby Drummer has been faxed paperwork 6 times and is not signing off on it and sending it back. I asked pt if Dr Trilby Drummer treats his diabetes and he said no that he went to another doctor for that but he does not remember the name. He said the doctor he was seeing for his diabetes took him off his medication because he lost a lot of weight and they told him it would cause him to have low sugar. Per pt they him to drink a soda if he needs it. Pt said he would check to see if he can find the doctors name that was treating him but he has not seen him in a while. Pt also stated he does not check his diabetes.

## 2017-05-27 DIAGNOSIS — D631 Anemia in chronic kidney disease: Secondary | ICD-10-CM | POA: Diagnosis not present

## 2017-05-27 DIAGNOSIS — N186 End stage renal disease: Secondary | ICD-10-CM | POA: Diagnosis not present

## 2017-05-27 DIAGNOSIS — N2581 Secondary hyperparathyroidism of renal origin: Secondary | ICD-10-CM | POA: Diagnosis not present

## 2017-05-27 DIAGNOSIS — E1129 Type 2 diabetes mellitus with other diabetic kidney complication: Secondary | ICD-10-CM | POA: Diagnosis not present

## 2017-05-27 DIAGNOSIS — D509 Iron deficiency anemia, unspecified: Secondary | ICD-10-CM | POA: Diagnosis not present

## 2017-05-28 ENCOUNTER — Ambulatory Visit: Payer: Medicare Other | Admitting: Sports Medicine

## 2017-05-29 DIAGNOSIS — E1129 Type 2 diabetes mellitus with other diabetic kidney complication: Secondary | ICD-10-CM | POA: Diagnosis not present

## 2017-05-29 DIAGNOSIS — N2581 Secondary hyperparathyroidism of renal origin: Secondary | ICD-10-CM | POA: Diagnosis not present

## 2017-05-29 DIAGNOSIS — D509 Iron deficiency anemia, unspecified: Secondary | ICD-10-CM | POA: Diagnosis not present

## 2017-05-29 DIAGNOSIS — N186 End stage renal disease: Secondary | ICD-10-CM | POA: Diagnosis not present

## 2017-05-29 DIAGNOSIS — D631 Anemia in chronic kidney disease: Secondary | ICD-10-CM | POA: Diagnosis not present

## 2017-06-02 ENCOUNTER — Encounter (HOSPITAL_COMMUNITY): Payer: Self-pay | Admitting: Emergency Medicine

## 2017-06-02 ENCOUNTER — Emergency Department (HOSPITAL_COMMUNITY): Payer: Medicare Other

## 2017-06-02 ENCOUNTER — Inpatient Hospital Stay (HOSPITAL_COMMUNITY)
Admission: EM | Admit: 2017-06-02 | Discharge: 2017-06-04 | DRG: 091 | Disposition: A | Discharge status: Home / Self Care | Payer: Medicare Other | Attending: Internal Medicine | Admitting: Internal Medicine

## 2017-06-02 DIAGNOSIS — Z833 Family history of diabetes mellitus: Secondary | ICD-10-CM

## 2017-06-02 DIAGNOSIS — E1169 Type 2 diabetes mellitus with other specified complication: Secondary | ICD-10-CM | POA: Diagnosis not present

## 2017-06-02 DIAGNOSIS — Z86010 Personal history of colonic polyps: Secondary | ICD-10-CM

## 2017-06-02 DIAGNOSIS — Z808 Family history of malignant neoplasm of other organs or systems: Secondary | ICD-10-CM

## 2017-06-02 DIAGNOSIS — T426X5A Adverse effect of other antiepileptic and sedative-hypnotic drugs, initial encounter: Secondary | ICD-10-CM | POA: Diagnosis present

## 2017-06-02 DIAGNOSIS — Z888 Allergy status to other drugs, medicaments and biological substances status: Secondary | ICD-10-CM | POA: Diagnosis not present

## 2017-06-02 DIAGNOSIS — Z992 Dependence on renal dialysis: Secondary | ICD-10-CM

## 2017-06-02 DIAGNOSIS — Z9981 Dependence on supplemental oxygen: Secondary | ICD-10-CM

## 2017-06-02 DIAGNOSIS — I132 Hypertensive heart and chronic kidney disease with heart failure and with stage 5 chronic kidney disease, or end stage renal disease: Secondary | ICD-10-CM | POA: Diagnosis present

## 2017-06-02 DIAGNOSIS — E875 Hyperkalemia: Secondary | ICD-10-CM | POA: Diagnosis not present

## 2017-06-02 DIAGNOSIS — Z955 Presence of coronary angioplasty implant and graft: Secondary | ICD-10-CM | POA: Diagnosis not present

## 2017-06-02 DIAGNOSIS — F32A Depression, unspecified: Secondary | ICD-10-CM | POA: Diagnosis present

## 2017-06-02 DIAGNOSIS — E1121 Type 2 diabetes mellitus with diabetic nephropathy: Secondary | ICD-10-CM | POA: Diagnosis present

## 2017-06-02 DIAGNOSIS — Z8639 Personal history of other endocrine, nutritional and metabolic disease: Secondary | ICD-10-CM

## 2017-06-02 DIAGNOSIS — Z7982 Long term (current) use of aspirin: Secondary | ICD-10-CM

## 2017-06-02 DIAGNOSIS — Z9115 Patient's noncompliance with renal dialysis: Secondary | ICD-10-CM

## 2017-06-02 DIAGNOSIS — E1140 Type 2 diabetes mellitus with diabetic neuropathy, unspecified: Secondary | ICD-10-CM | POA: Diagnosis not present

## 2017-06-02 DIAGNOSIS — E1122 Type 2 diabetes mellitus with diabetic chronic kidney disease: Secondary | ICD-10-CM | POA: Diagnosis present

## 2017-06-02 DIAGNOSIS — F329 Major depressive disorder, single episode, unspecified: Secondary | ICD-10-CM | POA: Diagnosis present

## 2017-06-02 DIAGNOSIS — E11621 Type 2 diabetes mellitus with foot ulcer: Secondary | ICD-10-CM | POA: Diagnosis present

## 2017-06-02 DIAGNOSIS — N186 End stage renal disease: Secondary | ICD-10-CM | POA: Diagnosis not present

## 2017-06-02 DIAGNOSIS — Z794 Long term (current) use of insulin: Secondary | ICD-10-CM | POA: Diagnosis not present

## 2017-06-02 DIAGNOSIS — Z79899 Other long term (current) drug therapy: Secondary | ICD-10-CM | POA: Diagnosis not present

## 2017-06-02 DIAGNOSIS — R030 Elevated blood-pressure reading, without diagnosis of hypertension: Secondary | ICD-10-CM | POA: Diagnosis not present

## 2017-06-02 DIAGNOSIS — Z9861 Coronary angioplasty status: Secondary | ICD-10-CM

## 2017-06-02 DIAGNOSIS — R392 Extrarenal uremia: Secondary | ICD-10-CM | POA: Diagnosis not present

## 2017-06-02 DIAGNOSIS — I110 Hypertensive heart disease with heart failure: Secondary | ICD-10-CM | POA: Diagnosis present

## 2017-06-02 DIAGNOSIS — I517 Cardiomegaly: Secondary | ICD-10-CM | POA: Diagnosis not present

## 2017-06-02 DIAGNOSIS — L97511 Non-pressure chronic ulcer of other part of right foot limited to breakdown of skin: Secondary | ICD-10-CM | POA: Diagnosis not present

## 2017-06-02 DIAGNOSIS — N19 Unspecified kidney failure: Secondary | ICD-10-CM

## 2017-06-02 DIAGNOSIS — K219 Gastro-esophageal reflux disease without esophagitis: Secondary | ICD-10-CM | POA: Diagnosis not present

## 2017-06-02 DIAGNOSIS — Z91040 Latex allergy status: Secondary | ICD-10-CM | POA: Diagnosis not present

## 2017-06-02 DIAGNOSIS — R4182 Altered mental status, unspecified: Secondary | ICD-10-CM | POA: Diagnosis not present

## 2017-06-02 DIAGNOSIS — H547 Unspecified visual loss: Secondary | ICD-10-CM | POA: Diagnosis present

## 2017-06-02 DIAGNOSIS — I2510 Atherosclerotic heart disease of native coronary artery without angina pectoris: Secondary | ICD-10-CM

## 2017-06-02 DIAGNOSIS — L97519 Non-pressure chronic ulcer of other part of right foot with unspecified severity: Secondary | ICD-10-CM

## 2017-06-02 DIAGNOSIS — I120 Hypertensive chronic kidney disease with stage 5 chronic kidney disease or end stage renal disease: Secondary | ICD-10-CM | POA: Diagnosis not present

## 2017-06-02 DIAGNOSIS — G92 Toxic encephalopathy: Secondary | ICD-10-CM | POA: Diagnosis present

## 2017-06-02 DIAGNOSIS — T481X5A Adverse effect of skeletal muscle relaxants [neuromuscular blocking agents], initial encounter: Secondary | ICD-10-CM | POA: Diagnosis present

## 2017-06-02 DIAGNOSIS — I5032 Chronic diastolic (congestive) heart failure: Secondary | ICD-10-CM | POA: Diagnosis not present

## 2017-06-02 DIAGNOSIS — G9341 Metabolic encephalopathy: Secondary | ICD-10-CM

## 2017-06-02 DIAGNOSIS — Z87891 Personal history of nicotine dependence: Secondary | ICD-10-CM

## 2017-06-02 DIAGNOSIS — R404 Transient alteration of awareness: Secondary | ICD-10-CM | POA: Diagnosis not present

## 2017-06-02 LAB — CBC WITH DIFFERENTIAL/PLATELET
BASOS ABS: 0 10*3/uL (ref 0.0–0.1)
BASOS PCT: 0 %
EOS ABS: 0.1 10*3/uL (ref 0.0–0.7)
Eosinophils Relative: 1 %
HEMATOCRIT: 36.8 % — AB (ref 39.0–52.0)
HEMOGLOBIN: 12 g/dL — AB (ref 13.0–17.0)
LYMPHS ABS: 0.9 10*3/uL (ref 0.7–4.0)
Lymphocytes Relative: 10 %
MCH: 26.8 pg (ref 26.0–34.0)
MCHC: 32.6 g/dL (ref 30.0–36.0)
MCV: 82.1 fL (ref 78.0–100.0)
MONOS PCT: 6 %
Monocytes Absolute: 0.6 10*3/uL (ref 0.1–1.0)
NEUTROS ABS: 7.8 10*3/uL — AB (ref 1.7–7.7)
Neutrophils Relative %: 83 %
Platelets: 108 10*3/uL — ABNORMAL LOW (ref 150–400)
RBC: 4.48 MIL/uL (ref 4.22–5.81)
RDW: 19.2 % — ABNORMAL HIGH (ref 11.5–15.5)
WBC: 9.4 10*3/uL (ref 4.0–10.5)

## 2017-06-02 LAB — I-STAT TROPONIN, ED: TROPONIN I, POC: 0.07 ng/mL (ref 0.00–0.08)

## 2017-06-02 LAB — CBG MONITORING, ED
GLUCOSE-CAPILLARY: 79 mg/dL (ref 65–99)
GLUCOSE-CAPILLARY: 100 mg/dL — AB (ref 65–99)

## 2017-06-02 LAB — COMPREHENSIVE METABOLIC PANEL
ALBUMIN: 4.1 g/dL (ref 3.5–5.0)
ALK PHOS: 59 U/L (ref 38–126)
ALT: 14 U/L — ABNORMAL LOW (ref 17–63)
ANION GAP: 18 — AB (ref 5–15)
AST: 23 U/L (ref 15–41)
BILIRUBIN TOTAL: 1.2 mg/dL (ref 0.3–1.2)
BUN: 87 mg/dL — AB (ref 6–20)
CALCIUM: 9.9 mg/dL (ref 8.9–10.3)
CO2: 19 mmol/L — AB (ref 22–32)
Chloride: 97 mmol/L — ABNORMAL LOW (ref 101–111)
Creatinine, Ser: 15.97 mg/dL — ABNORMAL HIGH (ref 0.61–1.24)
GFR calc Af Amer: 3 mL/min — ABNORMAL LOW (ref >60)
GFR calc non Af Amer: 3 mL/min — ABNORMAL LOW (ref >60)
GLUCOSE: 75 mg/dL (ref 65–99)
Potassium: 7.2 mmol/L (ref 3.5–5.1)
SODIUM: 134 mmol/L — AB (ref 135–145)
Total Protein: 9 g/dL — ABNORMAL HIGH (ref 6.5–8.1)

## 2017-06-02 LAB — I-STAT CHEM 8, ED
BUN: 77 mg/dL — AB (ref 6–20)
CREATININE: 17.2 mg/dL — AB (ref 0.61–1.24)
Calcium, Ion: 1.09 mmol/L — ABNORMAL LOW (ref 1.15–1.40)
Chloride: 105 mmol/L (ref 101–111)
GLUCOSE: 75 mg/dL (ref 65–99)
HCT: 38 % — ABNORMAL LOW (ref 39.0–52.0)
HEMOGLOBIN: 12.9 g/dL — AB (ref 13.0–17.0)
Potassium: 7.3 mmol/L (ref 3.5–5.1)
Sodium: 138 mmol/L (ref 135–145)
TCO2: 25 mmol/L (ref 22–32)

## 2017-06-02 LAB — ETHANOL

## 2017-06-02 LAB — BRAIN NATRIURETIC PEPTIDE: B Natriuretic Peptide: 1213.4 pg/mL — ABNORMAL HIGH (ref 0.0–100.0)

## 2017-06-02 LAB — I-STAT CG4 LACTIC ACID, ED: Lactic Acid, Venous: 2.52 mmol/L (ref 0.5–1.9)

## 2017-06-02 LAB — LIPASE, BLOOD: LIPASE: 26 U/L (ref 11–51)

## 2017-06-02 MED ORDER — ASPIRIN 81 MG PO CHEW
81.0000 mg | CHEWABLE_TABLET | Freq: Every day | ORAL | Status: DC
  Administered 2017-06-04: 81 mg via ORAL
  Filled 2017-06-02 (×2): qty 1

## 2017-06-02 MED ORDER — ATORVASTATIN CALCIUM 20 MG PO TABS
20.0000 mg | ORAL_TABLET | Freq: Every day | ORAL | Status: DC
  Filled 2017-06-02: qty 1

## 2017-06-02 MED ORDER — CINACALCET HCL 30 MG PO TABS
180.0000 mg | ORAL_TABLET | ORAL | Status: DC
  Administered 2017-06-03: 180 mg via ORAL
  Filled 2017-06-02: qty 6

## 2017-06-02 MED ORDER — HEPARIN SODIUM (PORCINE) 5000 UNIT/ML IJ SOLN
5000.0000 [IU] | Freq: Three times a day (TID) | INTRAMUSCULAR | Status: DC
  Administered 2017-06-03 (×2): 5000 [IU] via SUBCUTANEOUS
  Filled 2017-06-02 (×3): qty 1

## 2017-06-02 MED ORDER — RENA-VITE PO TABS
1.0000 | ORAL_TABLET | Freq: Every day | ORAL | Status: DC
  Administered 2017-06-03: 1 via ORAL
  Filled 2017-06-02: qty 1

## 2017-06-02 MED ORDER — IPRATROPIUM-ALBUTEROL 0.5-2.5 (3) MG/3ML IN SOLN: 3.0000 mL | Freq: Four times a day (QID) | RESPIRATORY_TRACT | Status: DC | PRN

## 2017-06-02 MED ORDER — PANTOPRAZOLE SODIUM 40 MG PO TBEC
40.0000 mg | DELAYED_RELEASE_TABLET | Freq: Every day | ORAL | Status: DC
  Administered 2017-06-04: 40 mg via ORAL
  Filled 2017-06-02 (×2): qty 1

## 2017-06-02 MED ORDER — SENNOSIDES-DOCUSATE SODIUM 8.6-50 MG PO TABS: 1.0000 | ORAL_TABLET | Freq: Every evening | ORAL | Status: DC | PRN

## 2017-06-02 MED ORDER — PREGABALIN 75 MG PO CAPS: 75.0000 mg | ORAL_CAPSULE | Freq: Every day | ORAL | Status: DC

## 2017-06-02 MED ORDER — ACETAMINOPHEN 650 MG RE SUPP: 650.0000 mg | Freq: Four times a day (QID) | RECTAL | Status: DC | PRN

## 2017-06-02 MED ORDER — INSULIN ASPART 100 UNIT/ML IV SOLN
5.0000 [IU] | Freq: Once | INTRAVENOUS | Status: AC
  Administered 2017-06-02: 5 [IU] via INTRAVENOUS
  Filled 2017-06-02: qty 0.05

## 2017-06-02 MED ORDER — AMLODIPINE BESYLATE 10 MG PO TABS
10.0000 mg | ORAL_TABLET | Freq: Every day | ORAL | Status: DC
  Filled 2017-06-02: qty 1

## 2017-06-02 MED ORDER — ACETAMINOPHEN 325 MG PO TABS
650.0000 mg | ORAL_TABLET | Freq: Four times a day (QID) | ORAL | Status: DC | PRN
  Filled 2017-06-02: qty 2

## 2017-06-02 MED ORDER — DEXTROSE 50 % IV SOLN
50.0000 mL | Freq: Once | INTRAVENOUS | Status: AC
  Administered 2017-06-02: 50 mL via INTRAVENOUS
  Filled 2017-06-02: qty 50

## 2017-06-02 MED ORDER — CARVEDILOL 25 MG PO TABS
25.0000 mg | ORAL_TABLET | Freq: Two times a day (BID) | ORAL | Status: DC
  Administered 2017-06-03 – 2017-06-04 (×2): 25 mg via ORAL
  Filled 2017-06-02 (×3): qty 1

## 2017-06-02 MED ORDER — SEVELAMER CARBONATE 800 MG PO TABS
3200.0000 mg | ORAL_TABLET | Freq: Three times a day (TID) | ORAL | Status: DC
  Administered 2017-06-04: 3200 mg via ORAL
  Filled 2017-06-02 (×2): qty 4

## 2017-06-02 MED ORDER — SODIUM CHLORIDE 0.9 % IV SOLN
1.0000 g | Freq: Once | INTRAVENOUS | Status: AC
  Administered 2017-06-02: 1 g via INTRAVENOUS
  Filled 2017-06-02: qty 10

## 2017-06-02 NOTE — Consult Note (Signed)
Renal Service Consult Note [facility]  Luis Nickles 06/02/2017 [place] D Requesting Physician:  Dr Eppie Gibson  Reason for Consult:  ESRD pt with AMS/ high K/ pulm edema HPI: The patient is a 61 y.o. year-old with hx of HTN, CAD w stent, HL presenting to ED with confusion / AMS x 1-2 days.  Missed last HD on Friday.  K is high 7.3 and CXR +pulm edema.  Not in resp distress.  Pt confused.  Asked to see for HD.  Pt stuporous/ lethargic, doesn't provide any history.  Wife here and says he is not missing HD sessions, except this past Friday because pt was confused and said he had to go to work (but he doesn't work).  Wife says they called HD unit early on Sat to get HD in place of Friday, but they didn't call back til the end of the day and there was not time left for dialysis.   Wife denies any recent c/o CP, fevers. Some abd pain, no n/v/d.    Past Medical History  Past Medical History:  Diagnosis Date  . Anemia   . Anginal pain ([place])   . CAD (coronary artery disease)    a. per [facility] s/p 3.49mm x 11mm Vision BMS to mid LAD 12/2009 and Xience DES to mid LAD 10/2010.  [name] Chronic diastolic CHF (congestive heart failure) ([place])   . Colon polyps   . Daily headache   . ESRD on dialysis [facility]) since ~ 2008   "MWF; Jeneen Rinks" (03/04/2017)  . GERD (gastroesophageal reflux disease)   . Heart murmur   . Hematochezia    a. 2014: colonscopy, which showed moderately-sized internal hemorrhoids, two 22mm polyps in transverse colon and ascending colon that were resected, five 2-57mm polyps in sigmoid colon, descending colon, transverse colon, and ascending colon that were resected. An upper endoscopy was performed and showed normal esophagus, stomach, and duodenum.  . Hematuria    a. H/o hematuria 2014 with cystoscopy that was unrevealing for his source of hematuria. He underwent a kidney ultrasound on 10/14 that showed mildly echogenic and scarred kidneys compatible with medical  renal disease, without hydronephrosis or renal calculi.  [name] History of blood transfusion    "had colonoscopy done; they had to give me some blood"  . Hyperlipidemia   . Hypertension   . On home oxygen therapy    "2L prn" (07/21/2015); "been off it for awhile" (03/04/2017)  . Renal insufficiency   . 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 Type II diabetes mellitus ([place])    Past Surgical History  Past Surgical History:  Procedure Laterality Date  . AV FISTULA PLACEMENT Left ~ 2007   "upper arm"  . CARDIAC CATHETERIZATION  "several"  . CORONARY ANGIOPLASTY WITH STENT PLACEMENT  "several"  . CYSTOSCOPY W/ STONE MANIPULATION  X2?  . EYE SURGERY Bilateral    "laser OR for hemorrhage"  . LEFT HEART CATHETERIZATION WITH CORONARY ANGIOGRAM N/A 11/23/2014   Procedure: LEFT HEART CATHETERIZATION WITH CORONARY ANGIOGRAM;  Surgeon: Troy Sine, MD;  [facility];  Service: Cardiovascular;  Laterality: N/A;  . LITHOTRIPSY  X1   Family History  Family History  Problem Relation Age of Onset  . Bone cancer Mother   . Anuerysm Father   . Hypertension Unknown   . Diabetes type II Daughter    Social History  reports that he quit smoking about 6 years ago. His smoking  use included Cigarettes. He has a 4.00 pack-year smoking history. He has never used smokeless tobacco. He reports that he does not drink alcohol or use drugs. Allergies  Allergies  Allergen Reactions  . Enalapril Hives  . Latex Rash  . Tape Rash    TAPE MAKES SKIN BREAK OUT AND TURN RED   Home medications Prior to Admission medications   Medication Sig Start Date End Date Taking? Authorizing Provider  amLODipine (NORVASC) 10 MG tablet Take 10 mg by mouth at bedtime. 05/03/17  Yes [provider]  atorvastatin (LIPITOR) 20 MG tablet Take 20 mg by mouth at bedtime. Reported on 03/14/2016   Yes [provider]  carvedilol (COREG) 25 MG tablet Take 1 tablet (25 mg total) by mouth  2 (two) times daily with a meal. 02/08/17  Yes Lorella Nimrod, MD  multivitamin (RENA-VIT) TABS tablet Take 1 tablet by mouth at bedtime. 12/15/14  Yes Geradine Girt, DO  nitroGLYCERIN (NITROSTAT) 0.4 MG SL tablet Place 0.4 mg under the tongue every 5 (five) minutes as needed for chest pain.   Yes [provider]  pregabalin (LYRICA) 75 MG capsule Take 75 mg by mouth at bedtime.   Yes [provider]  albuterol (PROVENTIL HFA;VENTOLIN HFA) 108 (90 Base) MCG/ACT inhaler Inhale 1-2 puffs into the lungs every 6 (six) hours as needed for wheezing or shortness of breath. Patient not taking: Reported on 06/02/2017 01/19/16   Delsa Grana, PA-C  aspirin 81 MG chewable tablet Chew 1 tablet (81 mg total) by mouth daily. Patient not taking: Reported on 06/02/2017 07/22/15   Iline Oven, MD  cinacalcet [facility]) 90 MG tablet Take 180 mg by mouth every Monday, Wednesday, and Friday with hemodialysis.     [provider]  cyclobenzaprine (FLEXERIL) 10 MG tablet Take 1 tablet (10 mg total) by mouth 2 (two) times daily as needed for muscle spasms. Patient not taking: Reported on 06/02/2017 05/16/17   Lysbeth Penner, FNP  pantoprazole (PROTONIX) 40 MG tablet Take 1 tablet (40 mg total) by mouth daily. Patient not taking: Reported on 06/02/2017 03/07/17 03/07/18  Dellia Nims, MD  pregabalin (LYRICA) 50 MG capsule Take 1 capsule (50 mg total) by mouth daily. Patient not taking: Reported on 06/02/2017 12/15/14   Geradine Girt, DO  sevelamer carbonate (RENVELA) 800 MG tablet Take 4 tablets (3,200 mg total) by mouth 3 (three) times daily with meals. Patient not taking: Reported on 06/02/2017 03/12/16   Geradine Girt, DO   Liver Function Tests  Recent Labs Lab 06/02/17 2031  AST 23  ALT 14*  ALKPHOS 59  BILITOT 1.2  PROT 9.0*  ALBUMIN 4.1    Recent Labs Lab 06/02/17 2031  LIPASE 26   CBC  Recent Labs Lab 06/02/17 2031 06/02/17 2038  WBC 9.4  --   NEUTROABS 7.8*  --    HGB 12.0* 12.9*  HCT 36.8* 38.0*  MCV 82.1  --   PLT 108*  --    Basic Metabolic Panel  Recent Labs Lab 06/02/17 2031 06/02/17 2038  NA 134* 138  K 7.2* 7.3*  CL 97* 105  CO2 19*  --   GLUCOSE 75 75  BUN 87* 77*  CREATININE 15.97* 17.20*  CALCIUM 9.9  --    Iron/TIBC/Ferritin/ %Sat    Component Value Date/Time   IRON 62 11/23/2014 0426   TIBC 180 (L) 11/23/2014 0426   FERRITIN 1,129 (H) 11/23/2014 0426   IRONPCTSAT 34 11/23/2014 0426  Vitals:   06/02/17 2123 06/02/17 2130 06/02/17 2145 06/02/17 2200  BP: (!) 172/103 (!) 189/101 (!) 171/95 (!) 175/96  Pulse: 87 90 86 93  Resp: (!) 22 (!) 23 (!) 22 17  Temp:      TempSrc:      SpO2: (!) 85% 97% 97% 94%   Exam Gen confused, lethargic/ somnolent, doesn't respond to questions, moaning No rash, cyanosis or gangrene Sclera anicteric, throat not seen  No jvd or bruits Chest bibasilar crackles, no wheezing RRR no MRG Abd soft ntnd no mass or ascites +bs MS no joint effusions or deformity Ext no LE or UE edema / no wounds or ulcers Neuro as above, nonfocal, moving all ext LUA AVF aneurysmal , +bruit, no signs infection    Home meds:  Norvasc/ lipitor/coreg/ MVI/ lyrica/ asa/ sensipar/ flexeril/ protonix/ renvela/ NTG  Dialysis: MWF [facility] 4h  LUA AVF  Hep 8000 - get other records in am    Impression: 1  Altered mental status - poss uremia vs meds. No narcotics on med list.  Not febrile, WBC wnl.  2  Pulm edema - on CXR, missed last HD 3  ESRD / severe hyperkalemia - K 7.3.  Not sure what compliance is like, wonder if signing off early.  Wife says is not missing HD sessions, except this past Friday because pt was confused and said he had to go to work (but he doesn't work).  Wife says they called HD unit early on Sat to get HD, but they didn't call back til the end of the day and there was not time left for dialysis.  4  CAD hx stent 5  HTN - BP's up, get vol down, cont BP meds 6  Hx DM - not on medication  now    Plan - needs HD tonight, plan HD x 3hrs, get K, solute and volume down.  See if MS improves any.   Kelly Splinter MD [employer] pager [phone]   06/02/2017, 10:30 PM

## 2017-06-02 NOTE — ED Notes (Signed)
PA Phylliss Bob given a copy of lactic acid results 2.52

## 2017-06-02 NOTE — ED Provider Notes (Signed)
[facility] DEPT Provider Note   CSN: [id number] Arrival date & time: 06/02/17  2015     History   Chief Complaint Chief Complaint  Patient presents with  . Altered Mental Status    HPI [name] [name] is a 61 y.o. male.  HPI Level V caveat due to altered mental status. Patient gets hemodialysis Monday, Wednesday, and Friday. Per EMS patient missed his Friday hemodialysis. When EMS arrived patient was carrying stool in his hands. No obvious injury. Past Medical History:  Diagnosis Date  . Anemia   . Anginal pain ([place])   . CAD (coronary artery disease)    a. per [facility] s/p 3.46mm x 31mm Vision BMS to mid LAD 12/2009 and Xience DES to mid LAD 10/2010.  [name] Chronic diastolic CHF (congestive heart failure) ([place])   . Colon polyps   . Daily headache   . ESRD on dialysis [facility]) since ~ 2008   "MWF; Jeneen Rinks" (03/04/2017)  . GERD (gastroesophageal reflux disease)   . Heart murmur   . Hematochezia    a. 2014: colonscopy, which showed moderately-sized internal hemorrhoids, two 13mm polyps in transverse colon and ascending colon that were resected, five 2-53mm polyps in sigmoid colon, descending colon, transverse colon, and ascending colon that were resected. An upper endoscopy was performed and showed normal esophagus, stomach, and duodenum.  . Hematuria    a. H/o hematuria 2014 with cystoscopy that was unrevealing for his source of hematuria. He underwent a kidney ultrasound on 10/14 that showed mildly echogenic and scarred kidneys compatible with medical renal disease, without hydronephrosis or renal calculi.  [name] History of blood transfusion    "had colonoscopy done; they had to give me some blood"  . Hyperlipidemia   . Hypertension   . On home oxygen therapy    "2L prn" (07/21/2015); "been off it for awhile" (03/04/2017)  . Renal insufficiency   . 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 Type II diabetes mellitus [facility])     Patient Active Problem  List   Diagnosis Date Noted  . Callus of foot 03/07/2017  . Acute respiratory failure with hypoxia ([place]) 02/08/2017  . Pulmonary edema 03/18/2016  . ESRD (end stage renal disease) ([place]) 03/18/2016  . Fluid overload 03/18/2016  . End-stage renal disease on hemodialysis ([place])   . Hypertensive heart disease with heart failure ([place])   . Mitral regurgitation   . Hyperkalemia 03/10/2016  . Acute pulmonary edema ([facility])   . Dyspnea 02/19/2016  . Acute on chronic diastolic CHF (congestive heart failure), NYHA class 1 ([place]) 01/02/2016  . ESRD (end stage renal disease) on dialysis ([place]) 01/01/2016  . Elevated troponin   . Emesis 12/23/2015  . Leukocytosis 12/23/2015  . Right sided weakness 10/28/2015  . Diabetic neuropathy ([place]) 07/29/2015  . Depression 07/22/2015  . GERD (gastroesophageal reflux disease) 06/04/2015  . Malnutrition of moderate degree ([employer] [place]) 05/17/2015  . Thrombocytopenia ([place]) 05/16/2015  . Weight loss 05/16/2015  . Angina of effort ([place]) 05/16/2015  . H/O TIA (transient ischemic attack) 04/01/2015  . Diastolic dysfunction-grade 2 12/13/2014  . Hypertension 04/27/2014  . CAD -S/P LAD BMS 2011, LAD DES 2012- patent cors Feb 2016 04/27/2014  . Diabetes mellitus type 2, diet-controlled ([place]) 04/27/2014  . Chest pain, atypical 04/26/2014    Past Surgical History:  Procedure Laterality Date  . AV FISTULA PLACEMENT Left ~ 2007   "upper arm"  . CARDIAC CATHETERIZATION  "several"  . CORONARY ANGIOPLASTY  WITH STENT PLACEMENT  "several"  . CYSTOSCOPY W/ STONE MANIPULATION  X2?  . EYE SURGERY Bilateral    "laser OR for hemorrhage"  . LEFT HEART CATHETERIZATION WITH CORONARY ANGIOGRAM N/A 11/23/2014   Procedure: LEFT HEART CATHETERIZATION WITH CORONARY ANGIOGRAM;  Surgeon: Troy Sine, MD;  Location: [facility] CATH LAB;  Service: Cardiovascular;  Laterality: N/A;  . LITHOTRIPSY  X1       Home Medications    Prior to Admission medications   Medication Sig Start Date End Date  Taking? Authorizing Provider  amLODipine (NORVASC) 10 MG tablet Take 10 mg by mouth at bedtime. 05/03/17  Yes [provider]  atorvastatin (LIPITOR) 20 MG tablet Take 20 mg by mouth at bedtime. Reported on 03/14/2016   Yes [provider]  carvedilol (COREG) 25 MG tablet Take 1 tablet (25 mg total) by mouth 2 (two) times daily with a meal. 02/08/17  Yes Lorella Nimrod, MD  multivitamin (RENA-VIT) TABS tablet Take 1 tablet by mouth at bedtime. 12/15/14  Yes Geradine Girt, DO  nitroGLYCERIN (NITROSTAT) 0.4 MG SL tablet Place 0.4 mg under the tongue every 5 (five) minutes as needed for chest pain.   Yes [provider]  pregabalin (LYRICA) 75 MG capsule Take 75 mg by mouth at bedtime.   Yes [provider]  albuterol (PROVENTIL HFA;VENTOLIN HFA) 108 (90 Base) MCG/ACT inhaler Inhale 1-2 puffs into the lungs every 6 (six) hours as needed for wheezing or shortness of breath. Patient not taking: Reported on 06/02/2017 01/19/16   Delsa Grana, PA-C  aspirin 81 MG chewable tablet Chew 1 tablet (81 mg total) by mouth daily. Patient not taking: Reported on 06/02/2017 07/22/15   Iline Oven, MD  cinacalcet [facility]) 90 MG tablet Take 180 mg by mouth every Monday, Wednesday, and Friday with hemodialysis.     [provider]  cyclobenzaprine (FLEXERIL) 10 MG tablet Take 1 tablet (10 mg total) by mouth 2 (two) times daily as needed for muscle spasms. Patient not taking: Reported on 06/02/2017 05/16/17   Lysbeth Penner, FNP  pantoprazole (PROTONIX) 40 MG tablet Take 1 tablet (40 mg total) by mouth daily. Patient not taking: Reported on 06/02/2017 03/07/17 03/07/18  Dellia Nims, MD  pregabalin (LYRICA) 50 MG capsule Take 1 capsule (50 mg total) by mouth daily. Patient not taking: Reported on 06/02/2017 12/15/14   Geradine Girt, DO  sevelamer carbonate (RENVELA) 800 MG tablet Take 4 tablets (3,200 mg total) by mouth 3 (three) times daily with meals. Patient not taking:  Reported on 06/02/2017 03/12/16   Geradine Girt, DO    Family History Family History  Problem Relation Age of Onset  . Bone cancer Mother   . Anuerysm Father   . Hypertension Unknown   . Diabetes type II Daughter     Social History Social History  Substance Use Topics  . Smoking status: Former Smoker    Packs/day: 0.50    Years: 8.00    Types: Cigarettes    Quit date: 01/07/2011  . Smokeless tobacco: Never Used  . Alcohol use No     Allergies   Enalapril; Latex; and Tape   Review of Systems Review of Systems  Unable to perform ROS: Mental status change     Physical Exam Updated Vital Signs BP (!) 181/97 (BP Location: Right Arm)   Pulse 88   Temp 98.7 F (37.1 C) (Oral)   Resp 18   Wt 92.2 kg (203 lb 4.8 oz)  SpO2 90%   BMI 27.57 kg/m   Physical Exam  Constitutional: He appears well-developed and well-nourished. No distress.  HENT:  Head: Normocephalic and atraumatic.  Mouth/Throat: Oropharynx is clear and moist.  No obvious injury.  Eyes: Pupils are equal, round, and reactive to light. EOM are normal.  Pupils are 3 mm and sluggish.  Neck: Normal range of motion. Neck supple.  No meningismus  Cardiovascular: Normal rate and regular rhythm.  Exam reveals no gallop and no friction rub.   No murmur heard. Pulmonary/Chest: Effort normal. No respiratory distress. He has no wheezes. He has rales. He exhibits no tenderness.  Rales in bilateral bases.  Abdominal: Soft. Bowel sounds are normal. There is no tenderness. There is no rebound and no guarding.  Musculoskeletal: Normal range of motion. He exhibits no edema or tenderness.  Left upper extremity fistula with palpable thrill.  Lymphadenopathy:    He has no cervical adenopathy.  Neurological:  Patient is drowsy but arousable. Has intermittent episodes of confusion and agitation. Moving all extremities without focal deficit. Sensation intact.  Skin: Skin is warm and dry. No rash noted. No erythema.    Psychiatric: He has a normal mood and affect. His behavior is normal.  Nursing note and vitals reviewed.    ED Treatments / Results  Labs (all labs ordered are listed, but only abnormal results are displayed) Labs Reviewed  CBC WITH DIFFERENTIAL/PLATELET - Abnormal; Notable for the following:       Result Value   Hemoglobin 12.0 (*)    HCT 36.8 (*)    RDW 19.2 (*)    Platelets 108 (*)    Neutro Abs 7.8 (*)    All other components within normal limits  COMPREHENSIVE METABOLIC PANEL - Abnormal; Notable for the following:    Sodium 134 (*)    Potassium 7.2 (*)    Chloride 97 (*)    CO2 19 (*)    BUN 87 (*)    Creatinine, Ser 15.97 (*)    Total Protein 9.0 (*)    ALT 14 (*)    GFR calc non Af Amer 3 (*)    GFR calc Af Amer 3 (*)    Anion gap 18 (*)    All other components within normal limits  BRAIN NATRIURETIC PEPTIDE - Abnormal; Notable for the following:    B Natriuretic Peptide 1,213.4 (*)    All other components within normal limits  I-STAT CHEM 8, ED - Abnormal; Notable for the following:    Potassium 7.3 (*)    BUN 77 (*)    Creatinine, Ser 17.20 (*)    Calcium, Ion 1.09 (*)    Hemoglobin 12.9 (*)    HCT 38.0 (*)    All other components within normal limits  I-STAT CG4 LACTIC ACID, ED - Abnormal; Notable for the following:    Lactic Acid, Venous 2.52 (*)    All other components within normal limits  CBG MONITORING, ED - Abnormal; Notable for the following:    Glucose-Capillary 100 (*)    All other components within normal limits  CULTURE, BLOOD (ROUTINE X 2)  CULTURE, BLOOD (ROUTINE X 2)  LIPASE, BLOOD  ETHANOL  RAPID URINE DRUG SCREEN, HOSP PERFORMED  CBG MONITORING, ED  I-STAT TROPONIN, ED    EKG  EKG Interpretation  Date/Time:  Sunday June 02 2017 20:18:29 EDT Ventricular Rate:  86 PR Interval:    QRS Duration: 109 QT Interval:  394 QTC Calculation: 472 R Axis:  85 Text Interpretation:  Sinus rhythm Probable left atrial enlargement Left  ventricular hypertrophy Minimal ST elevation, inferior leads Confirmed by Lita Mains  MD, Zaccheaus Storlie ([id number]) on 06/02/2017 8:43:34 PM       Radiology Dg Chest Port 1 View  Result Date: 06/02/2017 CLINICAL DATA:  Altered mental status EXAM: PORTABLE CHEST 1 VIEW COMPARISON:  03/04/2017 FINDINGS: Mild cardiomegaly. Central vascular congestion and diffuse hazy and ground-glass density bilaterally. No large effusion. No pneumothorax. IMPRESSION: Cardiomegaly with central vascular congestion and mild diffuse ground-glass densities suspicious for pulmonary edema. Electronically Signed   By: Donavan Foil M.D.   On: 06/02/2017 21:13    Procedures Procedures (including critical care time)  Medications Ordered in ED Medications  calcium gluconate 1 g in sodium chloride 0.9 % 100 mL IVPB (0 g Intravenous Stopped 06/02/17 2211)  insulin aspart (novoLOG) injection 5 Units (5 Units Intravenous Given 06/02/17 2116)  dextrose 50 % solution 50 mL (50 mLs Intravenous Given 06/02/17 2117)   CRITICAL CARE Performed by: Lita Mains, Jourdin Connors Total critical care time: 25 minutes Critical care time was exclusive of separately billable procedures and treating other patients. Critical care was necessary to treat or prevent imminent or life-threatening deterioration. Critical care was time spent personally by me on the following activities: development of treatment plan with patient and/or surrogate as well as nursing, discussions with consultants, evaluation of patient's response to treatment, examination of patient, obtaining history from patient or surrogate, ordering and performing treatments and interventions, ordering and review of laboratory studies, ordering and review of radiographic studies, pulse oximetry and re-evaluation of patient's condition.  Initial Impression / Assessment and Plan / ED Course  I have reviewed the triage vital signs and the nursing notes.  Pertinent labs & imaging results that were available  during my care of the patient were reviewed by me and considered in my medical decision making (see chart for details).     Peak T waves on EKG. Patient has elevation in potassium. Given IV calcium and insulin/glucose. Will need to be admitted for dialysis. This is likely the source of his confusion. No evidence of any head trauma.  Discussed with Dr. Burnett Sheng. We'll plan on dialysis this evening. Internal medicine team will admit. Final Clinical Impressions(s) / ED Diagnoses   Final diagnoses:  Hyperkalemia  Uremia    New Prescriptions Current Discharge Medication List       Julianne Rice, MD 06/02/17 2336

## 2017-06-02 NOTE — ED Notes (Signed)
Pt placed on 2L [place] for SpO2 sats dropping into the 80s.

## 2017-06-02 NOTE — ED Notes (Signed)
Called pharmacy asking about insulin for IV push for a critical potassium. Was informed that we do not have the proper syringes for IV push insulin. [place] stated main pharmacy will send up to [place]

## 2017-06-02 NOTE — ED Triage Notes (Signed)
Pt presents via EMS for altered mental status. Pt is a dialysis patient that goes M,W,F. Missed Friday. Pt is altered in triage seems to have difficulty speaking. Can follow some commands and responds to pain

## 2017-06-03 ENCOUNTER — Inpatient Hospital Stay (HOSPITAL_COMMUNITY): Payer: Medicare Other

## 2017-06-03 ENCOUNTER — Encounter (HOSPITAL_COMMUNITY): Payer: Self-pay

## 2017-06-03 DIAGNOSIS — E1140 Type 2 diabetes mellitus with diabetic neuropathy, unspecified: Secondary | ICD-10-CM

## 2017-06-03 DIAGNOSIS — L97511 Non-pressure chronic ulcer of other part of right foot limited to breakdown of skin: Secondary | ICD-10-CM

## 2017-06-03 DIAGNOSIS — G9341 Metabolic encephalopathy: Secondary | ICD-10-CM

## 2017-06-03 DIAGNOSIS — E875 Hyperkalemia: Secondary | ICD-10-CM

## 2017-06-03 DIAGNOSIS — K219 Gastro-esophageal reflux disease without esophagitis: Secondary | ICD-10-CM

## 2017-06-03 DIAGNOSIS — I132 Hypertensive heart and chronic kidney disease with heart failure and with stage 5 chronic kidney disease, or end stage renal disease: Secondary | ICD-10-CM

## 2017-06-03 DIAGNOSIS — Z87891 Personal history of nicotine dependence: Secondary | ICD-10-CM

## 2017-06-03 DIAGNOSIS — N19 Unspecified kidney failure: Secondary | ICD-10-CM

## 2017-06-03 DIAGNOSIS — I5032 Chronic diastolic (congestive) heart failure: Secondary | ICD-10-CM

## 2017-06-03 DIAGNOSIS — Z91040 Latex allergy status: Secondary | ICD-10-CM

## 2017-06-03 DIAGNOSIS — E1122 Type 2 diabetes mellitus with diabetic chronic kidney disease: Secondary | ICD-10-CM

## 2017-06-03 DIAGNOSIS — N186 End stage renal disease: Secondary | ICD-10-CM

## 2017-06-03 DIAGNOSIS — Z955 Presence of coronary angioplasty implant and graft: Secondary | ICD-10-CM

## 2017-06-03 DIAGNOSIS — I2510 Atherosclerotic heart disease of native coronary artery without angina pectoris: Secondary | ICD-10-CM

## 2017-06-03 LAB — CBC
HCT: 32.4 % — ABNORMAL LOW (ref 39.0–52.0)
HEMOGLOBIN: 10.8 g/dL — AB (ref 13.0–17.0)
MCH: 27.2 pg (ref 26.0–34.0)
MCHC: 33.3 g/dL (ref 30.0–36.0)
MCV: 81.6 fL (ref 78.0–100.0)
PLATELETS: 114 10*3/uL — AB (ref 150–400)
RBC: 3.97 MIL/uL — AB (ref 4.22–5.81)
RDW: 19.1 % — ABNORMAL HIGH (ref 11.5–15.5)
WBC: 6.5 10*3/uL (ref 4.0–10.5)

## 2017-06-03 LAB — URINALYSIS, ROUTINE W REFLEX MICROSCOPIC
Bacteria, UA: NONE SEEN
Bilirubin Urine: NEGATIVE
GLUCOSE, UA: 50 mg/dL — AB
KETONES UR: NEGATIVE mg/dL
LEUKOCYTES UA: NEGATIVE
Nitrite: NEGATIVE
PROTEIN: 100 mg/dL — AB
SQUAMOUS EPITHELIAL / LPF: NONE SEEN
Specific Gravity, Urine: 1.009 (ref 1.005–1.030)
pH: 8 (ref 5.0–8.0)

## 2017-06-03 LAB — RENAL FUNCTION PANEL
ALBUMIN: 3.6 g/dL (ref 3.5–5.0)
ALBUMIN: 3.6 g/dL (ref 3.5–5.0)
ANION GAP: 13 (ref 5–15)
ANION GAP: 15 (ref 5–15)
BUN: 91 mg/dL — ABNORMAL HIGH (ref 6–20)
BUN: 41 mg/dL — ABNORMAL HIGH (ref 6–20)
CHLORIDE: 98 mmol/L — AB (ref 101–111)
CO2: 23 mmol/L (ref 22–32)
CO2: 27 mmol/L (ref 22–32)
CREATININE: 15.98 mg/dL — AB (ref 0.61–1.24)
Calcium: 9.9 mg/dL (ref 8.9–10.3)
Calcium: 9.8 mg/dL (ref 8.9–10.3)
Chloride: 102 mmol/L (ref 101–111)
Creatinine, Ser: 9.65 mg/dL — ABNORMAL HIGH (ref 0.61–1.24)
GFR calc Af Amer: 6 mL/min — ABNORMAL LOW (ref >60)
GFR calc non Af Amer: 5 mL/min — ABNORMAL LOW (ref >60)
GFR, EST AFRICAN AMERICAN: 3 mL/min — AB (ref >60)
GFR, EST NON AFRICAN AMERICAN: 3 mL/min — AB (ref >60)
GLUCOSE: 60 mg/dL — AB (ref 65–99)
Glucose, Bld: 78 mg/dL (ref 65–99)
PHOSPHORUS: 4.9 mg/dL — AB (ref 2.5–4.6)
POTASSIUM: 4.4 mmol/L (ref 3.5–5.1)
Phosphorus: 6.1 mg/dL — ABNORMAL HIGH (ref 2.5–4.6)
Potassium: 6.7 mmol/L (ref 3.5–5.1)
SODIUM: 138 mmol/L (ref 135–145)
Sodium: 140 mmol/L (ref 135–145)

## 2017-06-03 LAB — RAPID URINE DRUG SCREEN, HOSP PERFORMED
Amphetamines: NOT DETECTED
Barbiturates: NOT DETECTED
Benzodiazepines: NOT DETECTED
Cocaine: NOT DETECTED
OPIATES: NOT DETECTED
TETRAHYDROCANNABINOL: NOT DETECTED

## 2017-06-03 LAB — MRSA PCR SCREENING: MRSA by PCR: NEGATIVE

## 2017-06-03 MED ORDER — OXYCODONE HCL 5 MG PO TABS
10.0000 mg | ORAL_TABLET | Freq: Once | ORAL | Status: AC
  Administered 2017-06-03: 10 mg via ORAL

## 2017-06-03 MED ORDER — OXYCODONE HCL 5 MG PO TABS
ORAL_TABLET | ORAL | Status: AC
  Administered 2017-06-03: 10 mg via ORAL
  Filled 2017-06-03: qty 2

## 2017-06-03 NOTE — Progress Notes (Signed)
Internal Medicine Attending  Date: 06/03/2017  Patient name: [name] [name] Medical record number: [id number] Date of birth: 1956/05/15 Age: 61 y.o. Gender: male  I saw and evaluated the patient. I reviewed the resident's note by Dr. Berneice Gandy and I agree with the resident's findings and plans as documented in her progress note.  Please see my H&P dated 06/03/2017 and attached to Dr. Tally Joe H&P dated 06/03/2017 for the specifics of my evaluation, assessment, and plan from earlier in the day.

## 2017-06-03 NOTE — Progress Notes (Addendum)
Pt refused all oral morning meds. Several attempts made. MD Patel aware.

## 2017-06-03 NOTE — Progress Notes (Signed)
Patient arrived to unit per bed.  Reviewed treatment plan and this RN agrees.  Report received from bedside RN, Lars Mage.  Consent obtained.  Patient Alert, oriented to self and place. Lung sounds diminished and coarse to ausculation in all fields. No edema. Cardiac: NSR.  Prepped LUAVF with alcohol and cannulated with two 15 gauge needles.  Pulsation of blood noted.  Flushed access well with saline per protocol.  Connected and secured lines and initiated tx at 0039.  UF goal of 3500 mL and net fluid removal of 3000 mL.  Will continue to monitor.

## 2017-06-03 NOTE — Progress Notes (Signed)
Critical lab result received: Potassium of 6.7.  Patient undergoing dialysis with lab drawn pre treatment.  Will continue to monitor.

## 2017-06-03 NOTE — Evaluation (Signed)
Clinical/Bedside Swallow Evaluation Patient Details  Name: [name] [name] MRN: [id number] Date of Birth: 04-20-1956  Today's Date: 06/03/2017 Time: SLP Start Time (ACUTE ONLY): 1315 SLP Stop Time (ACUTE ONLY): 1330 SLP Time Calculation (min) (ACUTE ONLY): 15 min  Past Medical History:  Past Medical History:  Diagnosis Date  . Anemia   . Anginal pain ([place])   . CAD (coronary artery disease)    a. per [facility] s/p 3.36mm x 71mm Vision BMS to mid LAD 12/2009 and Xience DES to mid LAD 10/2010.  [name] Chronic diastolic CHF (congestive heart failure) ([place])   . Colon polyps   . Daily headache   . ESRD on dialysis [facility]) since ~ 2008   "MWF; Jeneen Rinks" (03/04/2017)  . GERD (gastroesophageal reflux disease)   . Heart murmur   . Hematochezia    a. 2014: colonscopy, which showed moderately-sized internal hemorrhoids, two 60mm polyps in transverse colon and ascending colon that were resected, five 2-21mm polyps in sigmoid colon, descending colon, transverse colon, and ascending colon that were resected. An upper endoscopy was performed and showed normal esophagus, stomach, and duodenum.  . Hematuria    a. H/o hematuria 2014 with cystoscopy that was unrevealing for his source of hematuria. He underwent a kidney ultrasound on 10/14 that showed mildly echogenic and scarred kidneys compatible with medical renal disease, without hydronephrosis or renal calculi.  [name] History of blood transfusion    "had colonoscopy done; they had to give me some blood"  . Hyperlipidemia   . Hypertension   . On home oxygen therapy    "2L prn" (07/21/2015); "been off it for awhile" (03/04/2017)  . Renal insufficiency   . 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 Type II diabetes mellitus ([place])    Past Surgical History:  Past Surgical History:  Procedure Laterality Date  . AV FISTULA PLACEMENT Left ~ 2007   "upper arm"  . CARDIAC CATHETERIZATION  "several"  . CORONARY ANGIOPLASTY WITH STENT  PLACEMENT  "several"  . CYSTOSCOPY W/ STONE MANIPULATION  X2?  . EYE SURGERY Bilateral    "laser OR for hemorrhage"  . LEFT HEART CATHETERIZATION WITH CORONARY ANGIOGRAM N/A 11/23/2014   Procedure: LEFT HEART CATHETERIZATION WITH CORONARY ANGIOGRAM;  Surgeon: Troy Sine, MD;  Location: [facility] CATH LAB;  Service: Cardiovascular;  Laterality: N/A;  . LITHOTRIPSY  X1   HPI:  61 yo Male with a history of ESRD (on HD MWF); CHF; CAD; T2DM (diet controlled); HTN; and GERD who presentsto the ED with altered mental status.   Assessment / Plan / Recommendation Clinical Impression  Followed PT's efforts at evaluation.  Pt's MS prevented full participation in clinical swallow evaluation. Unable to follow commands; continued to slide down in bed despite cues and attempts at repositioning. No focal deficits.  Consumed limited thin liquids with no s/s of aspiration; pt refused all further POs, solids/liquids.  Recommend thin liquids; allow meds whole in puree when pt is alert/attentive.   Primary barrier to safe eating is cognition.  SLP will f/u next date for readiness to advance diet.  SLP Visit Diagnosis: Dysphagia, unspecified (R13.10)    Aspiration Risk  Mild aspiration risk    Diet Recommendation   allow thin liquids; meds whole in puree  Medication Administration: Whole meds with puree    Other  Recommendations Oral Care Recommendations: Oral care QID   Follow up Recommendations None      Frequency and Duration min 1  x/week  1 week       Prognosis Prognosis for Safe Diet Advancement: Good      Swallow Study   General Date of Onset: 06/03/17 HPI: 61 yo Male with a history of ESRD (on HD MWF); CHF; CAD; T2DM (diet controlled); HTN; and GERD who presentsto the ED with altered mental status. Type of Study: Bedside Swallow Evaluation Previous Swallow Assessment: no Diet Prior to this Study: NPO Temperature Spikes Noted: No Respiratory Status: Nasal cannula History of Recent  Intubation: No Behavior/Cognition: Alert;Confused;Distractible;Requires cueing Oral Cavity Assessment:  (?thrush) Oral Care Completed by SLP: No Oral Cavity - Dentition: Edentulous Vision: Functional for self-feeding Self-Feeding Abilities: Needs assist Patient Positioning: Upright in bed Baseline Vocal Quality: Normal Volitional Cough: Cognitively unable to elicit Volitional Swallow: Unable to elicit    Oral/Motor/Sensory Function Overall Oral Motor/Sensory Function: Within functional limits   Ice Chips Ice chips: Not tested   Thin Liquid Thin Liquid: Within functional limits    Nectar Thick Nectar Thick Liquid: Not tested   Honey Thick Honey Thick Liquid: Not tested   Puree Puree: Not tested   Solid   GO   Solid: Not tested        [name] 06/03/2017,1:35 PM

## 2017-06-03 NOTE — H&P (Signed)
Date: 06/03/2017               Patient Name:  [name] [name] MRN: [id number]  DOB: 12/11/1955 Age / Sex: 61 y.o., male   PCP: Lynnda Child, PA-C         Medical Service: Internal Medicine Teaching Service         Attending Physician: Dr. Oval Linsey, MD    First Contact: Dr. Berneice Gandy Pager: [phone]  Second Contact: Dr. Charlynn Grimes Pager: [phone]       After Hours (After 5p/  First Contact Pager: [phone]  weekends / holidays): Second Contact Pager: [phone]   Chief Complaint: Altered Mental Status  History of Present Illness: [name] [name] is a 61 yo Male with a history of ESRD (on HD MWF); CHFpEF; CAD (s/p LAD BMS 2011, LAD DES 2012); T2DM (diet controlled); HTN; and GERD who presents to the ED with altered mental status. Patient is unable to answer questions, history obtained from family and chart review. Patient's wife states that he was acting abnormally today; walking through the house nude (which he never does) and needing to support himself as he walked. Around 5pm he came to her in their bed room and said "I'm sick" and she saw that he was holding apiece of fecal matter in his hand. She then called EMS. She states that, on Friday, he refused his tranportation to his HD session; stating that he had to go to work, which was unusual because he does not work. They tried to schedule a make-up session on Saturday but were unable to do so. He has also been experiencing increased coughing, heavy breathing, and a dark stool. She says has been taking his medications, but did not take them today. He does still make some urine.  In the ED, patient was hemodynamically stable with HR 90s, BP 160s/90s, 94-96%RA, RR 20-23. CMP showed Cr 15.97 (up from 6.46), BUN 87 (up from 16), K 7.2; CBG 79; CBC WNL; ETOH <5; Troponin 0.07; Lactate 2.52; BNP 1213.4; and Lipase 26. EKG showed sinus rhythm and peaked T-waves. Chest Xray showed central vascular congestion and pulmonary edema. Patient was given Ca  Gluconate, Insulin, D50. Nephrology was consulted in the ED as well for urgent HD. Patient to be admitted for further workup and treatment.  Meds:  Current Meds  Medication Sig  . amLODipine (NORVASC) 10 MG tablet Take 10 mg by mouth at bedtime.  [name] atorvastatin (LIPITOR) 20 MG tablet Take 20 mg by mouth at bedtime. Reported on 03/14/2016  . carvedilol (COREG) 25 MG tablet Take 1 tablet (25 mg total) by mouth 2 (two) times daily with a meal.  . multivitamin (RENA-VIT) TABS tablet Take 1 tablet by mouth at bedtime.  . nitroGLYCERIN (NITROSTAT) 0.4 MG SL tablet Place 0.4 mg under the tongue every 5 (five) minutes as needed for chest pain.  . pregabalin (LYRICA) 75 MG capsule Take 75 mg by mouth at bedtime.    Allergies: Allergies as of 06/02/2017 - Review Complete 06/02/2017  Allergen Reaction Noted  . Enalapril Hives 04/26/2014  . Latex Rash 09/23/2016  . Tape Rash 09/23/2016   Past Medical History:  Diagnosis Date  . Anemia   . Anginal pain ([place])   . CAD (coronary artery disease)    a. per [facility] s/p 3.55mm x 53mm Vision BMS to mid LAD 12/2009 and Xience DES to mid LAD 10/2010.  [name] Chronic diastolic CHF (congestive heart failure) ([place])   . Colon polyps   .  Daily headache   . ESRD on dialysis [facility]) since ~ 2008   "MWF; Jeneen Rinks" (03/04/2017)  . GERD (gastroesophageal reflux disease)   . Heart murmur   . Hematochezia    a. 2014: colonscopy, which showed moderately-sized internal hemorrhoids, two 55mm polyps in transverse colon and ascending colon that were resected, five 2-12mm polyps in sigmoid colon, descending colon, transverse colon, and ascending colon that were resected. An upper endoscopy was performed and showed normal esophagus, stomach, and duodenum.  . Hematuria    a. H/o hematuria 2014 with cystoscopy that was unrevealing for his source of hematuria. He underwent a kidney ultrasound on 10/14 that showed mildly echogenic and scarred kidneys compatible with medical renal  disease, without hydronephrosis or renal calculi.  [name] History of blood transfusion    "had colonoscopy done; they had to give me some blood"  . Hyperlipidemia   . Hypertension   . On home oxygen therapy    "2L prn" (07/21/2015); "been off it for awhile" (03/04/2017)  . Renal insufficiency   . 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 Type II diabetes mellitus ([facility])     Family History:  Family History  Problem Relation Age of Onset  . Bone cancer Mother   . Anuerysm Father   . Hypertension Unknown   . Diabetes type II Daughter     Social History: Social History  Substance Use Topics  . Smoking status: Former Smoker    Packs/day: 0.50    Years: 8.00    Types: Cigarettes    Quit date: 01/07/2011  . Smokeless tobacco: Never Used  . Alcohol use No  - Married  Review of Systems: A complete ROS was unable to be performed due to patient's AMS  Physical Exam: Blood pressure (!) 164/90, pulse 86, temperature 98.6 F (37 C), resp. rate (!) 21, weight 203 lb 4.2 oz (92.2 kg), SpO2 90 %. Physical Exam  Constitutional: He appears well-developed and well-nourished.  HENT:  Head: Normocephalic and atraumatic.  Eyes: EOM are normal. Right eye exhibits no discharge. Left eye exhibits no discharge.  Cardiovascular: Normal rate, regular rhythm, normal heart sounds and intact distal pulses.   Pulmonary/Chest: Effort normal. No respiratory distress.  Exam limited by patient's AMS  Abdominal: Soft. Bowel sounds are normal. He exhibits no distension. There is no tenderness.  Musculoskeletal: He exhibits no edema.  Wound at medial aspect of R 1st Hallux  Neurological: He is alert.  Patient disoriented  Skin: Skin is warm and dry.     EKG: personally reviewed my interpretation is Sinus rhythm at 86 BPM with peaked T-waves  CXR: personally reviewed and I agree with central vascular congestion and mild diffuse ground-glass densities suspicious for pulmonary  edema.  Assessment & Plan by Problem:  [name] [name] is a 61 yo Male with a history of ESRD (on HD MWF); CHFpEF; CAD (s/p LAD BMS 2011, LAD DES 2012); T2DM (diet controlled); HTN; and GERD present to the ED with altered mental status.  ESRD with AMS and Hyperkalemia: On HD, MWF. Patient presenting with AMS, suspected to be 2/2 uremia after missing his HD session on Friday. Cr 15.97 (up from 6.46), BUN 87 (up from 16). Patient also presenting with hyperkalemia. K initially 7.2. Peaked T-wave on EKG, normal QRS. Received Ca, Insulin, D50 in ED. Repeat K 6.7.   - Nephrology on board - Hemodialysis tonight - Continue Cinacalcet 180mg  MWF with HD - Continue Sevelamer  3,200mg  TID [facility] - Cardiac Monitoring - Repeat EKG in AM - Renal Function Panel in AM  CAD: (s/p LAD BMS 2011, LAD DES 2012, with low risk NM perfusion study 10/2015) - Continue Carvedilol 20mg  BID - Continue ASA 81mg  Daily - Continue Atorvastatin 20mg  qhs - Continue Nitro 0.4 PRN Chest Pain  CHF: Last Echo 07/28/16 @ [facility] showed EF 55% to 60%, Moderate-to-severe mitral regurgitation, and Moderately dilated left atrium. - Continue Carvedilol 20mg  BID  HTN - Continue Amlodipine 10mg  Daily  Diabetic Neuropathy. - Continue Pregabalin 75mg  qhs  GERD - Continue Pantoprazole 30mg  Daily  FEN: NPO DVT Prophylaxis: Heparin  Code Status: Full  Dispo: Admit patient to Inpatient with expected length of stay greater than 2 midnights.  Signed: Neva Seat, MD 06/03/2017, 1:30 AM  Pager: [phone]

## 2017-06-03 NOTE — Progress Notes (Signed)
Initial Nutrition Assessment  DOCUMENTATION CODES:   Not applicable  INTERVENTION:   -RD will follow for diet advancement and supplement as appropriate -Continue rena-vit daily  NUTRITION DIAGNOSIS:   Inadequate oral intake related to lethargy/confusion as evidenced by NPO status.  GOAL:   Patient will meet greater than or equal to 90% of their needs  MONITOR:   Diet advancement, PO intake, Labs, Weight trends, Skin, I & O's  REASON FOR ASSESSMENT:   Malnutrition Screening Tool    ASSESSMENT:   [name] [name] is a 61 yo Male with a history of ESRD (on HD MWF); CHFpEF; CAD (s/p LAD BMS 2011, LAD DES 2012); T2DM (diet controlled); HTN; and GERD who presents to the ED with altered mental status.   Pt admitted with AMS. Per H&P, pt refused HD treatment on Friday (05/31/17). Last HD sessions this AM.   Pt lying in bed at time of visit. Responds "yeah" or "thank you" to all questions asked. No family at bedside to provide further hx.   Per SLP notes, recommending liquid diet. Pt was non-cooperative with SLP evaluation and mental status is impairing diet advancement.   Reviewed wt hx; wt has been stable over the past year.   Nutrition-Focused physical exam completed. Findings are no fat depletion, mil muscle depletion (lower extremities only), and no edema.   Labs reviewed.   Diet Order:  Diet NPO time specified  Skin:  Reviewed, no issues  Last BM:  PTA  Height:   Ht Readings from Last 1 Encounters:  06/03/17 6\' 1"  (1.854 m)    Weight:   Wt Readings from Last 1 Encounters:  06/03/17 194 lb 12.8 oz (88.4 kg)    Ideal Body Weight:  83.6 kg  BMI:  Body mass index is 25.7 kg/m.  Estimated Nutritional Needs:   Kcal:  2000-2200  Protein:  105-120 grams  Fluid:  1000 ml +UOP  EDUCATION NEEDS:   Education needs no appropriate at this time  [name] [name] A. Jimmye Norman, RD, LDN, CDE Pager: [phone] After hours Pager: [phone]

## 2017-06-03 NOTE — Progress Notes (Signed)
Impression: 1  Altered mental status - toxic metabolic v other, sl improved 2  Pulm edema - 12900cc with HD yesterday 3  ESRD / severe hyperkalemia - K 7.3., improved.  Missed HD Frifday 5  HTN - BP better  Plan - WIll do short HD today to keep on sched and remove any toxins  Subjective: Interval History: " i'm in [place]"  Objective: Vital signs in last 24 hours: Temp:  [98.1 F (36.7 C)-98.9 F (37.2 C)] 98.9 F (37.2 C) (08/27 0609) Pulse Rate:  [69-94] 71 (08/27 0342) Resp:  [17-23] 21 (08/27 0027) BP: (129-189)/(81-108) 145/81 (08/27 0609) SpO2:  [85 %-97 %] 97 % (08/27 0609) Weight:  [88.4 kg (194 lb 12.8 oz)-92.2 kg (203 lb 4.8 oz)] 88.4 kg (194 lb 12.8 oz) (08/27 0609) Weight change:   Intake/Output from previous day: 08/26 0701 - 08/27 0700 In: 110 [IV Piggyback:110] Out: 2900  Intake/Output this shift: Total I/O In: -  Out: 175 [Urine:175]  General appearance: confused Resp: clear to auscultation bilaterally Cardio: regular rate and rhythm, S1, S2 normal, no murmur, click, rub or gallop Extremities: LUE AVF  Lab Results:  Recent Labs  06/02/17 2031 06/02/17 2038 06/03/17 0649  WBC 9.4  --  6.5  HGB 12.0* 12.9* 10.8*  HCT 36.8* 38.0* 32.4*  PLT 108*  --  114*   BMET:  Recent Labs  06/03/17 0040 06/03/17 0649  NA 138 140  K 6.7* 4.4  CL 102 98*  CO2 23 27  GLUCOSE 78 60*  BUN 91* 41*  CREATININE 15.98* 9.65*  CALCIUM 9.9 9.8   No results for input(s): PTH in the last 72 hours. Iron Studies: No results for input(s): IRON, TIBC, TRANSFERRIN, FERRITIN in the last 72 hours. Studies/Results: Ct Head Wo Contrast  Result Date: 06/03/2017 CLINICAL DATA:  Altered mental status. EXAM: CT HEAD WITHOUT CONTRAST TECHNIQUE: Contiguous axial images were obtained from the base of the skull through the vertex without intravenous contrast. COMPARISON:  02/13/2016 FINDINGS: Brain: No acute intracranial abnormality. No hemorrhage, hydrocephalus, acute  infarction or mass lesion. Vascular: No hyperdense vessel or unexpected calcification. Skull: No acute calvarial abnormality. Sinuses/Orbits: Paranasal sinuses and mastoids are clear. Orbital soft tissues are unremarkable. Other:  None IMPRESSION: No intracranial abnormality. Electronically Signed   By: Rolm Baptise M.D.   On: 06/03/2017 11:23   Dg Chest Port 1 View  Result Date: 06/02/2017 CLINICAL DATA:  Altered mental status EXAM: PORTABLE CHEST 1 VIEW COMPARISON:  03/04/2017 FINDINGS: Mild cardiomegaly. Central vascular congestion and diffuse hazy and ground-glass density bilaterally. No large effusion. No pneumothorax. IMPRESSION: Cardiomegaly with central vascular congestion and mild diffuse ground-glass densities suspicious for pulmonary edema. Electronically Signed   By: Donavan Foil M.D.   On: 06/02/2017 21:13   Dg Foot Complete Right  Result Date: 06/03/2017 CLINICAL DATA:  Cutaneous ulcer of a total of the right foot. EXAM: RIGHT FOOT COMPLETE - 3+ VIEW COMPARISON:  Foot radiographs of 26 March 2017 from outside facility. These are not labeled right or left. FINDINGS: The bones are subjectively adequately mineralized. There is no acute or healing fracture. There is no lytic or blastic lesion. The cortical margins of the phalanges and metatarsals remain sharp. There is a cutaneous defect along the medial aspect of the great toe at the level of the IP joint. There are vascular calcifications. The metatarsals and tarsals appear intact. There is a plantar calcaneal spur. IMPRESSION: There is no radiographic evidence of acute osteomyelitis. There is soft  tissue defect along the medial aspect of the great toe consistent with the known ulcer. Electronically Signed   By: David  Martinique M.D.   On: 06/03/2017 11:02    Scheduled: . amLODipine  10 mg Oral QHS  . aspirin  81 mg Oral Daily  . atorvastatin  20 mg Oral QHS  . carvedilol  25 mg Oral BID WC  . cinacalcet  180 mg Oral Once per day on Mon Wed  Fri  . heparin  5,000 Units Subcutaneous Q8H  . multivitamin  1 tablet Oral QHS  . pantoprazole  40 mg Oral Daily  . sevelamer carbonate  3,200 mg Oral TID WC    LOS: 1 day   Rogan Wigley C 06/03/2017,12:09 PM

## 2017-06-03 NOTE — Progress Notes (Signed)
Dialysis treatment completed.  3500 mL ultrafiltrated and net fluid removal 2900 mL.    Patient status unchanged. Lung sounds diminished and clear to ausculation in all fields. No edema. Cardiac: NSR.  Disconnected lines and removed needles.  Pressure held for 10 minutes and band aid/gauze dressing applied.  Report given to bedside RN, Toyin.

## 2017-06-03 NOTE — Progress Notes (Signed)
PT Cancellation Note  Patient Details Name: [name] [name] MRN: [id number] DOB: 1956-04-13   Cancelled Treatment:    Reason Eval/Treat Not Completed: Patient declined, no reason specified.  PT attempted to see pt, pt agreeable to get up, then did not assist in transfer to sit.  Once sitting, pt yelling at therapist "I don't need you!" and in the same sentence he started to cry.  Not sure what is going on, but left pt EOB as he refused to get to the chair and refused to lay back down. RN made aware.  PT to check back tomorrow.  Thanks,    Barbarann Ehlers. Virgen Belland, PT, DPT [phone]   06/03/2017, 1:16 PM

## 2017-06-03 NOTE — Plan of Care (Signed)
Problem: Safety: Goal: Ability to remain free from injury will improve Outcome: Progressing Pt will be free from falls and injuries during this hospitalization.  Problem: Health Behavior/Discharge Planning: Goal: Ability to manage health-related needs will improve Outcome: Progressing Pt will be back at baseline prior to discharge.

## 2017-06-03 NOTE — Progress Notes (Signed)
Subjective:  [name] was seen laying in bed this morning. He answers yes to all questions and does not follow commands. History was limited 2/2 patient's mental status. New wife at bedside says she thinks he is in pain and that he needs more pain medications. She says that the patient started acting increasingly sleepy and stating things that were weird as early as Thursday evening, prior to missing dialysis on Friday. She notes an unhealed ulceration on the medial aspect of the patients large, right toe. She states that this has been there for awhile.   Objective:  Vital signs in last 24 hours: Vitals:   06/03/17 0330 06/03/17 0339 06/03/17 0342 06/03/17 0609  BP: 129/81 (!) 154/84 (!) 144/82 (!) 145/81  Pulse: 89 69 71   Resp:      Temp:  98.4 F (36.9 C)  98.9 F (37.2 C)  TempSrc:    Oral  SpO2:    97%  Weight:  196 lb 13.9 oz (89.3 kg)  194 lb 12.8 oz (88.4 kg)   Physical Exam  Constitutional:  Patient laying in bed, intermittently answering questions with "yes" but not responsive to commands  Cardiovascular: Normal rate and regular rhythm.   Murmur (Holosystolic murmur heard best at the heart apex/axilla but also present at LLSB) heard. Pulmonary/Chest: Effort normal. No respiratory distress. He has no wheezes.  Crackles appreciated at lung bases, however poor effort during exam 2/2 AMS.  Abdominal: Soft. He exhibits no distension. There is tenderness (bilateral lower quadrants).  Musculoskeletal: He exhibits no edema (of bilateral lower extremities).  R great toe has small, open sores without significant erythema, purulent discharge, or tenderness with palpation. His R great toe swollen compared to L great toe. Patient has numerous tattoos throughout chest, arms, and legs which are non-erythematous and non-tender to touch. One discrete black papule noticed on patient's L ventral thigh, however no ulcerations, swelling, or erythema appreciated.   Neurological:  Patient  alert but does not follow commands. Does not track movements with eyes or blink when object moves quickly towards his face.   Skin: Skin is warm and dry. Capillary refill takes less than 2 seconds. No rash noted. No erythema.   Assessment/Plan:  Active Problems:   ESRD (end stage renal disease) [name]  [name] is a 61 yo with a PMH of ESRD (dialysis MWF), CAD (s/p two LAD stents), HTN, HFpEF (echo 2016 with LVEF 45-50%) who presented to the ED with a 24-48 hour history of progressive altered mental status. He was admitted to the internal medicine teaching service with nephrology consulting for further workup and management. The specific problems addressed during this admission are as follows:  AMS: The patient still had decreased responsiveness on rounds this morning, as he was unable to follow commends, answer questions with words other than yes, and unable to state his wife's name when questioned. The patient has a non-healing wound on his R great toe that looks swollen compared to the left, but was not actively draining. This has been worked up in the past by podiatry, but will obtain X rays here to ensure that patient has not developed osteomyelitis. The patient was also not tracking the interviewer during rounds this morning and did not respond to visual stimuli appropriately. 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 Will consider stroke workup given the patient's AMS and visual changes on visual  exam. Patient also had tenderness to palpation of lower abdominal quadrants today. Will obtain urinalysis to rule out UTI.  -Follow up head CT, consider Brain MRI to look for posterior stroke given the patient's lack of response to visual stimuli -Follow up R foot X rays to look for osteomyelitis -Follow up bladder scan with urinalysis, consider CT A&P pending these studies and mental status improvement  ESRD on  HD MWF: Patient's BUN was elevated to 91 on admission. The patient's altered mental status was initially attributed to missing dialysis on Friday, but the patient's wife states that she noticed changes prior to Friday. The patient had urgent dialysis overnight, as he also had hyperkalemia with EKG changes. After dialysis his K went from 7.3 to 4.4 and his BUN decreased from 91-41. He also had 2.9L of fluid removed during this session. His mental status this morning did not appear to improve much with dialysis, suggesting that there may be an additional reason for his altered mental status. Want to make sure to rule out any reversible causes of AMS and will continue workup as outlined above.  -Nephrology consulting, recommendations appreciated -Continue dialysis MWF, Cinacalcet 180mg  with HD, and Sevelamer 3200 mg TID with meals  Hx of CAD, HTN, HFpEF: The patient is not currently complaining of chest pain and did not have EKG changes consistent with signs of ischemia on admission.  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 Will hold this medication pending improvement in mental status, as this may be one thing contributing to his acute change.   FEN/GI: -Continue pantoprazole 30 mg, daily -NPO pending improvement in mental status  VTE prophylaxis: Heparin  Dispo: Anticipated discharge in approximately 2-3 day(s).   Thomasene Ripple, MD 06/03/2017, 10:39 AM Pager: [phone]

## 2017-06-04 ENCOUNTER — Encounter (HOSPITAL_BASED_OUTPATIENT_CLINIC_OR_DEPARTMENT_OTHER): Payer: Medicare Other

## 2017-06-04 LAB — HEPATITIS B SURFACE ANTIGEN: HEP B S AG: NEGATIVE

## 2017-06-04 NOTE — Progress Notes (Signed)
Subjective:  [name] [name] was seen during dialysis this morning. Patient feels much better today. He was alert, oriented to setting and place, talkative and cooperative with exam. He was able to follow commands appropriately. He states that he feels much better and is ready to go home today.   Objective: Vital signs in last 24 hours: Vitals:   06/04/17 0900 06/04/17 0930 06/04/17 1000 06/04/17 1025  BP: (!) 167/89 (!) 170/94 (!) 167/91 (!) 172/94  Pulse: 82 83 83 75  Resp: 18 18 14 17   Temp:    97.6 F (36.4 C)  TempSrc:    Oral  SpO2:    96%  Weight:    186 lb 15.2 oz (84.8 kg)  Height:       Physical Exam  Constitutional: He appears well-developed and well-nourished. No distress.  Cardiovascular: Normal rate, regular rhythm and intact distal pulses.   Murmur (holosystolic murmur on left sternal border) heard. Pulmonary/Chest: Effort normal. No respiratory distress. He has no wheezes.  No crackles  Abdominal: Soft. He exhibits no distension. There is no tenderness. There is no guarding.  Musculoskeletal: He exhibits no edema (of bilateral lower extremities) or tenderness (of bilateral lower extremities).  Neurological:  Patient oriented to self, setting, and city. Patient cooperative with exam and follows commands today. PERRL. EOM intact. Face strength and sensation intact bilaterally. Smile symmetric. Gross upper and lower extremity strength intact bilaterally.   Skin: Skin is warm and dry. Capillary refill takes less than 2 seconds. No rash noted. No erythema. No pallor.   Assessment/Plan:  Principal Problem:   Acute metabolic encephalopathy Active Problems:   CAD -S/P LAD BMS 2011, LAD DES 2012- patent cors Feb 2016   Diabetes mellitus type 2, diet-controlled ([place])   Depression   Hypertensive heart disease with heart failure ([place])   ESRD (end stage renal disease) ([place])   Skin ulcer of toe of right foot, limited to breakdown of skin [facility])  [name] [name] is a 61 yo with  a PMH of ESRD (dialysis MWF), CAD (s/p two LAD stents), HTN, HFpEF (echo 2016 with LVEF 45-50%) who presented to the ED with a 24-48 hour history of progressive altered mental status. He was admitted to the internal medicine teaching service with nephrology consulting for further workup and management. The specific problems addressed during this admission are as follows:  AMS: The patient's mental status improved significantly with dialysis on Sunday night, Monday morning, and today. He is able to follow commands and interacts appropriately with interviewers. The patient's head CT showed no acute process and his R foot X-ray was not consistent with osteomyelitis. His urinalysis was not concerning for infection, his blood cultures are no growth at 24 hours, and the patient has remained afebrile throughout admission. He had no complaints of abdominal pain during exam today. Right now his AMS appears to be 2/2 polypharmacy, as the patient was recently started on Flexeril and Lyrica per chart review, and missed dialysis the Friday prior to presentation. His Flexeril and Lyrica have been held during admission and will be discontinued on discharge. The patient should avoid taking these medications in the future as they can cause confusion, which could then lead to missed dialysis and worsening AMS as seen during this hospitalization.  ESRD on HD MWF: Patient's BUN was elevated to 91 on admission. The patient's altered mental status was initially attributed to missing dialysis on Friday, but the patient's wife states that she noticed changes prior to Friday. The  patient had dialysis on admission, Monday afternoon, and today. As stated above, his mental status has continued to improve with dialysis and holding specific home medications. Patient will continue dialysis regimen as an outpatient after discharge. -Nephrology consulting, recommendations appreciated -Continue dialysis MWF, Cinacalcet 180mg  with HD, and  Sevelamer 3200 mg TID with meals  Hx of CAD, HTN, HFpEF: The patient's home carvedilol 20 mg BID, ASA 81 mg daily, Atorvastatin 20 mg daily, and amlodpine 10 mg daily will be continued.   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 This medication was held during hospitalization as it was thought that contribute to his AMS. Will discontinue this on discharge.   FEN/GI: Continue home pantoprazole 30 mg, daily.  Dispo: Anticipated discharge in approximately today.   [name] Ripple, MD 06/04/2017, 11:35 AM Pager: [phone]

## 2017-06-04 NOTE — Progress Notes (Signed)
Patient was discharged home by MD order; discharged instructions review and give to patient and his wife with care notes; IV DIC; patient will be escorted to the car by nurse tech via wheelchair.  

## 2017-06-04 NOTE — Discharge Summary (Signed)
Name: [name] [name] MRN: [id number] DOB: 03-18-1956 61 y.o. PCP: Lynnda Child, PA-C  Date of Admission: 06/02/2017  8:15 PM Date of Discharge: 06/04/2017 Attending Physician: Oval Linsey, MD  Discharge Diagnosis:  Principal Problem:   Acute metabolic encephalopathy Active Problems:   CAD -S/P LAD BMS 2011, LAD DES 2012- patent cors Feb 2016   Diabetes mellitus type 2, diet-controlled ([place])   Depression   Hypertensive heart disease with heart failure ([place])   ESRD (end stage renal disease) ([place])   Skin ulcer of toe of right foot, limited to breakdown of skin [facility])   Discharge Medications: Allergies as of 06/04/2017      Reactions   Enalapril Hives   Latex Rash   Tape Rash   TAPE MAKES SKIN BREAK OUT AND TURN RED      Medication List    STOP taking these medications   cyclobenzaprine 10 MG tablet Commonly known as:  FLEXERIL   pregabalin 50 MG capsule Commonly known as:  LYRICA   pregabalin 75 MG capsule Commonly known as:  LYRICA     TAKE these medications   albuterol 108 (90 Base) MCG/ACT inhaler Commonly known as:  PROVENTIL HFA;VENTOLIN HFA Inhale 1-2 puffs into the lungs every 6 (six) hours as needed for wheezing or shortness of breath.   amLODipine 10 MG tablet Commonly known as:  NORVASC Take 10 mg by mouth at bedtime.   aspirin 81 MG chewable tablet Chew 1 tablet (81 mg total) by mouth daily.   atorvastatin 20 MG tablet Commonly known as:  LIPITOR Take 20 mg by mouth at bedtime. Reported on 03/14/2016   carvedilol 25 MG tablet Commonly known as:  COREG Take 1 tablet (25 mg total) by mouth 2 (two) times daily with a meal.   cinacalcet 90 MG tablet Commonly known as:  SENSIPAR Take 180 mg by mouth every Monday, Wednesday, and Friday with hemodialysis.   multivitamin Tabs tablet Take 1 tablet by mouth at bedtime.   nitroGLYCERIN 0.4 MG SL tablet Commonly known as:  NITROSTAT Place 0.4 mg under the tongue every 5 (five) minutes as  needed for chest pain.   pantoprazole 40 MG tablet Commonly known as:  PROTONIX Take 1 tablet (40 mg total) by mouth daily.   sevelamer carbonate 800 MG tablet Commonly known as:  RENVELA Take 4 tablets (3,200 mg total) by mouth 3 (three) times daily with meals.            Discharge Care Instructions        Start     Ordered   06/04/17 0000  Diet - low sodium heart healthy     06/04/17 1259   06/04/17 0000  Discharge instructions    Comments:  Please continue your dialysis as regularly scheduled. You will follow up with your PCP regarding your hospitalization. Please stop taking Lyrica and Flexeril, as these medications can make you confused if you take them too often.   06/04/17 1259   06/04/17 0000  Activity as tolerated - No restrictions     06/04/17 1259   06/04/17 0000  Call MD for:  temperature >100.4     06/04/17 1259   06/04/17 0000  Call MD for:  persistant nausea and vomiting     06/04/17 1259   06/04/17 0000  Call MD for:  severe uncontrolled pain     06/04/17 1259      Disposition and follow-up:   [name] [name] was discharged from [facility] in Stable condition.  At the hospital follow up visit please address:  1.  [name] [name] presented to the ED with progressive AMS and a history of missing dialysis two days prior to presentation. His workup for an organic cause of AMS was negative. His mental status improved with discontinuation of Flexeril and Lyrica, as well as with three sessions of dialysis. He was told to discontinue Lyrica and Flexeril on discharge as these drugs likely contributed to his AMS.   Of note, it is unclear who is the patient's PCP. His wife and patient were not sure whether he is followed in [facility], as they received a letter from the nephrology clinic stating that all medications and care is supposed to go through the nephrologist. Consistent with this, his PCP is listed under care teams as someone in the Nephrology clinic.  It is unclear if they actually manage his chronic medical conditions because the patient states he has a hard time getting in touch with them (although they do currently prescribe all of his medications). I made an appointment for him in our clinic because we made medication changes that needed to be followed up.  Please try to figure out who his PCP is, address the changes made to his medicines, and assess if he is a good fit for our clinic.   2.  Labs / imaging needed at time of follow-up: None  3.  Pending labs/ test needing follow-up: None  Follow-up Appointments:   Hospital Course by problem list: Principal Problem:   Acute metabolic encephalopathy Active Problems:   CAD -S/P LAD BMS 2011, LAD DES 2012- patent cors Feb 2016   Diabetes mellitus type 2, diet-controlled ([place])   Depression   Hypertensive heart disease with heart failure ([place])   ESRD (end stage renal disease) ([place])   Skin ulcer of toe of right foot, limited to breakdown of skin [facility])  [name] [name] is a 61 yo with a PMH of ESRD (dialysis MWF), CAD (s/p two LAD stents), HTN, HFpEF (echo 2016 with LVEF 45-50%) who presented to the ED with a 24-48 hour history of progressive altered mental status. He was admitted to the internal medicine teaching service with nephrology consulting for further workup and management. The specific problems addressed during this admission are as follows:  AMS: Upon admission the patient was altered, not oriented to person, place, and did not follow commands appropriately. The patient's head CT showed no acute process and, while his R medial great toe had evidence of chronic ulceration, his R foot X-rays were not consistent with osteomyelitis. His chest X ray on admission showed no signs of pneumonia, his urinalysis was not concerning for infection, blood cultures are no growth at 24 hours, and the patient remained afebrile throughout admission. The patient's mental status improved significantly  with dialysis on admission, HD#1, and day of discharge. His AMS appeared to be 2/2 polypharmacy, as the patient was recently started on Flexeril and Lyrica per chart review, and missed dialysis prior to presentation. His Flexeril and Lyrica were held during admission and were discontinued on discharge. The patient should avoid taking these medications in the future as they can cause confusion and may precipitate AMS in the future.  ESRD on HD MWF: Patient's BUN was elevated to 91 on admission. The patient's altered mental status was initially attributed to missing dialysis on Friday, but the patient's wife states that she noticed changes prior to Friday. The patient had dialysis on admission, Monday afternoon,  and today. As stated above, his mental status has continued to improve with dialysis and holding specific home medications. Patient was instructed to continue MWF dialysis regimen as an outpatient after discharge.  Hx of CAD, HTN, HFpEF:The patient's home carvedilol 20 mg BID, ASA 81 mg daily, Atorvastatin 20 mg daily, and amlodpine 10 mg daily were continued during hospitalization.   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 This medication was held during hospitalization as it was thought that contribute to his AMS and was discontinued on discharge.  FEN/GI: The patient's home pantoprazole 30 mg, daily was continued throughout hospitalization  Discharge Vitals:   BP (!) 172/94   Pulse 75   Temp 97.6 F (36.4 C) (Oral)   Resp 17   Ht 6\' 1"  (1.854 m)   Wt 186 lb 15.2 oz (84.8 kg)   SpO2 96%   BMI 24.67 kg/m   Pertinent Labs, Studies, and Procedures:   BMP BMP Latest Ref Rng & Units 06/03/2017 06/03/2017 06/02/2017  Glucose 65 - 99 mg/dL 60(L) 78 75  BUN 6 - 20 mg/dL 41(H) 91(H) 77(H)  Creatinine 0.61 - 1.24 mg/dL 9.65(H) 15.98(H) 17.20(H)  Sodium 135 -  145 mmol/L 140 138 138  Potassium 3.5 - 5.1 mmol/L 4.4 6.7(HH) 7.3(HH)  Chloride 101 - 111 mmol/L 98(L) 102 105  CO2 22 - 32 mmol/L 27 23 -  Calcium 8.9 - 10.3 mg/dL 9.8 9.9 -   CBC CBC Latest Ref Rng & Units 06/03/2017 06/02/2017 06/02/2017  WBC 4.0 - 10.5 K/uL 6.5 - 9.4  Hemoglobin 13.0 - 17.0 g/dL 10.8(L) 12.9(L) 12.0(L)  Hematocrit 39.0 - 52.0 % 32.4(L) 38.0(L) 36.8(L)  Platelets 150 - 400 K/uL 114(L) - 108(L)   Urinalysis    Component Value Date/Time   COLORURINE YELLOW 06/03/2017 1155   APPEARANCEUR CLEAR 06/03/2017 1155   LABSPEC 1.009 06/03/2017 1155   PHURINE 8.0 06/03/2017 1155   GLUCOSEU 50 (A) 06/03/2017 1155   HGBUR SMALL (A) 06/03/2017 1155   BILIRUBINUR NEGATIVE 06/03/2017 1155   KETONESUR NEGATIVE 06/03/2017 1155   PROTEINUR 100 (A) 06/03/2017 1155   UROBILINOGEN 0.2 02/22/2015 2343   NITRITE NEGATIVE 06/03/2017 1155   LEUKOCYTESUR NEGATIVE 06/03/2017 1155   Drugs of Abuse     Component Value Date/Time   LABOPIA NONE DETECTED 06/03/2017 1155   COCAINSCRNUR NONE DETECTED 06/03/2017 1155   LABBENZ NONE DETECTED 06/03/2017 1155   AMPHETMU NONE DETECTED 06/03/2017 1155   THCU NONE DETECTED 06/03/2017 1155   LABBARB NONE DETECTED 06/03/2017 1155    CT Head: FINDINGS: Brain: No acute intracranial abnormality. No hemorrhage, hydrocephalus, acute infarction or mass lesion.  Vascular: No hyperdense vessel or unexpected calcification.  Skull: No acute calvarial abnormality.  Sinuses/Orbits: Paranasal sinuses and mastoids are clear. Orbital soft tissues are unremarkable.  Other:  None  IMPRESSION: No intracranial abnormality.  Chest Xray: FINDINGS: Mild cardiomegaly. Central vascular congestion and diffuse hazy and ground-glass density bilaterally. No large effusion. No pneumothorax.  IMPRESSION: Cardiomegaly with central vascular congestion and mild diffuse ground-glass densities suspicious for pulmonary edema.  Xray Foot,  Right: FINDINGS: The bones are subjectively adequately mineralized. There is no acute or healing fracture. There is no lytic or blastic lesion. The cortical margins of the phalanges and metatarsals remain sharp. There is a cutaneous defect along the medial aspect of the great toe at the level of the IP joint. There are vascular calcifications. The  metatarsals and tarsals appear intact. There is a plantar calcaneal spur.  IMPRESSION: There is no radiographic evidence of acute osteomyelitis. There is soft tissue defect along the medial aspect of the great toe consistent with the known ulcer.  Discharge Instructions: Discharge Instructions    Activity as tolerated - No restrictions    Complete by:  As directed    Call MD for:  persistant nausea and vomiting    Complete by:  As directed    Call MD for:  severe uncontrolled pain    Complete by:  As directed    Call MD for:  temperature >100.4    Complete by:  As directed    Diet - low sodium heart healthy    Complete by:  As directed    Discharge instructions    Complete by:  As directed    Please continue your dialysis as regularly scheduled. You will follow up with your PCP regarding your hospitalization. Please stop taking Lyrica and Flexeril, as these medications can make you confused if you take them too often.      SignedThomasene Ripple, MD 06/04/2017, 12:59 PM   Pager: [phone]

## 2017-06-04 NOTE — Progress Notes (Signed)
Patient back from Dialysis. Alert and oriented x 3, no complaints of pain or other discomfort. Will continue to monitor.

## 2017-06-04 NOTE — Evaluation (Signed)
Physical Therapy Evaluation Patient Details Name: [name] [name] MRN: [id number] DOB: 25-Aug-1956 Today's Date: 06/04/2017   History of Present Illness  [name] [name] is a 61 year old man with end-stage renal disease requiring hemodialysis, coronary artery disease status post percutaneous coronary intervention to the LAD twice, chronic diastolic heart failure, hypertension, and diabetes who presents to the emergency department with altered mental status 3 days  Clinical Impression  Pt is at or close to baseline functioning and should be safe at home even though pt's wife still not totally convinced that he won't have another episode of AMS.  At this time there are no further acute PT needs.  Will sign off at this time.     Follow Up Recommendations No PT follow up    Equipment Recommendations  None recommended by PT    Recommendations for Other Services       Precautions / Restrictions Precautions Precautions: Fall Restrictions Weight Bearing Restrictions: No      Mobility  Bed Mobility                  Transfers Overall transfer level: Independent                  Ambulation/Gait Ambulation/Gait assistance: Independent Ambulation Distance (Feet): 200 Feet Assistive device: None Gait Pattern/deviations: Step-through pattern   Gait velocity interpretation: Below normal speed for age/gender General Gait Details: safe and steady, but gait quality hindered by proximal weaknesses and neuropathy  Stairs Stairs: Yes   Stair Management: One rail Right;Alternating pattern;Forwards Number of Stairs: 10 General stair comments: generally safe with rail  Wheelchair Mobility    Modified Rankin (Stroke Patients Only)       Balance Overall balance assessment: Needs assistance   Sitting balance-Leahy Scale: Good       Standing balance-Leahy Scale: Fair                 High Level Balance Comments: pt able to back up, scan, change direction moderately  fast, step over obstacles and go up/down steps without overt devation or LOB             Pertinent Vitals/Pain Pain Assessment: No/denies pain    Home Living Family/patient expects to be discharged to:: Private residence Living Arrangements: Spouse/significant other Available Help at Discharge: Family;Available PRN/intermittently Type of Home: Apartment Home Access: Stairs to enter Entrance Stairs-Rails: Psychiatric nurse of Steps: 12 Home Layout: Two level Home Equipment: None      Prior Function Level of Independence: Independent               Hand Dominance        Extremity/Trunk Assessment   Upper Extremity Assessment Upper Extremity Assessment: Overall WFL for tasks assessed    Lower Extremity Assessment Lower Extremity Assessment: Overall WFL for tasks assessed (mild proximal weaknesses, h/o peripheral neuropathy)       Communication   Communication: No difficulties  Cognition Arousal/Alertness: Awake/alert Behavior During Therapy: WFL for tasks assessed/performed Overall Cognitive Status:  (Family states not back to baseline mentation.)                                 General Comments: pt not aware of events of the last few days, but pretty clear today.      General Comments      Exercises     Assessment/Plan    PT Assessment Patent does not need any  further PT services  PT Problem List         PT Treatment Interventions      PT Goals (Current goals can be found in the Care Plan section)  Acute Rehab PT Goals PT Goal Formulation: All assessment and education complete, DC therapy    Frequency     Barriers to discharge        Co-evaluation               AM-PAC PT "6 Clicks" Daily Activity  Outcome Measure Difficulty turning over in bed (including adjusting bedclothes, sheets and blankets)?: None Difficulty moving from lying on back to sitting on the side of the bed? : None Difficulty  sitting down on and standing up from a chair with arms (e.g., wheelchair, bedside commode, etc,.)?: None Help needed moving to and from a bed to chair (including a wheelchair)?: None Help needed walking in hospital room?: None Help needed climbing 3-5 steps with a railing? : A Little 6 Click Score: 23    End of Session   Activity Tolerance: Patient tolerated treatment well Patient left: in bed;with call bell/phone within reach;with family/visitor present (sitting EOB) Nurse Communication: Mobility status PT Visit Diagnosis: Unsteadiness on feet (R26.81);Other abnormalities of gait and mobility (R26.89)    Time: 1325-1346 PT Time Calculation (min) (ACUTE ONLY): 21 min   Charges:   PT Evaluation $PT Eval Moderate Complexity: 1 Mod     PT G Codes:        06-10-17  Donnella Sham, PT [phone]  (pager)  Tessie Fass Kesleigh Morson 2017-06-10, 2:05 PM

## 2017-06-04 NOTE — Progress Notes (Signed)
Internal Medicine Attending  Date: 06/04/2017  Patient name: [name] [name] Medical record number: [id number] Date of birth: 11-15-1955 Age: 61 y.o. Gender: male  I saw and evaluated the patient. I reviewed the resident's note by Dr. Berneice Gandy and I agree with the resident's findings and plans as documented in her progress note.  [name] was seen in the hemodialysis unit this morning. He was much more alert, interactive, and oriented. He followed commands without difficulty. It is felt that he is near his baseline and stable for discharge home. His metabolic encephalopathy may have been related to a uremia, as well as from the Flexeril and possibly the Lyrica. Two runs of hemodialysis have addressed the uremia and we have held the Flexeril and Lyrica with success regarding his mental status.

## 2017-06-05 DIAGNOSIS — N2581 Secondary hyperparathyroidism of renal origin: Secondary | ICD-10-CM | POA: Diagnosis not present

## 2017-06-05 DIAGNOSIS — E1129 Type 2 diabetes mellitus with other diabetic kidney complication: Secondary | ICD-10-CM | POA: Diagnosis not present

## 2017-06-05 DIAGNOSIS — D509 Iron deficiency anemia, unspecified: Secondary | ICD-10-CM | POA: Diagnosis not present

## 2017-06-05 DIAGNOSIS — N186 End stage renal disease: Secondary | ICD-10-CM | POA: Diagnosis not present

## 2017-06-05 DIAGNOSIS — D631 Anemia in chronic kidney disease: Secondary | ICD-10-CM | POA: Diagnosis not present

## 2017-06-06 ENCOUNTER — Emergency Department (HOSPITAL_COMMUNITY)
Admission: EM | Admit: 2017-06-06 | Discharge: 2017-06-06 | Disposition: A | Discharge status: Home / Self Care | Payer: Medicare Other | Attending: Emergency Medicine | Admitting: Emergency Medicine

## 2017-06-06 ENCOUNTER — Encounter (HOSPITAL_COMMUNITY): Payer: Self-pay

## 2017-06-06 ENCOUNTER — Emergency Department (HOSPITAL_COMMUNITY): Payer: Medicare Other

## 2017-06-06 DIAGNOSIS — E1122 Type 2 diabetes mellitus with diabetic chronic kidney disease: Secondary | ICD-10-CM | POA: Diagnosis not present

## 2017-06-06 DIAGNOSIS — G4700 Insomnia, unspecified: Secondary | ICD-10-CM | POA: Insufficient documentation

## 2017-06-06 DIAGNOSIS — Z79899 Other long term (current) drug therapy: Secondary | ICD-10-CM | POA: Diagnosis not present

## 2017-06-06 DIAGNOSIS — Z87891 Personal history of nicotine dependence: Secondary | ICD-10-CM | POA: Insufficient documentation

## 2017-06-06 DIAGNOSIS — I132 Hypertensive heart and chronic kidney disease with heart failure and with stage 5 chronic kidney disease, or end stage renal disease: Secondary | ICD-10-CM | POA: Diagnosis not present

## 2017-06-06 DIAGNOSIS — Z955 Presence of coronary angioplasty implant and graft: Secondary | ICD-10-CM | POA: Diagnosis not present

## 2017-06-06 DIAGNOSIS — I5032 Chronic diastolic (congestive) heart failure: Secondary | ICD-10-CM | POA: Diagnosis not present

## 2017-06-06 DIAGNOSIS — N186 End stage renal disease: Secondary | ICD-10-CM | POA: Insufficient documentation

## 2017-06-06 DIAGNOSIS — I2510 Atherosclerotic heart disease of native coronary artery without angina pectoris: Secondary | ICD-10-CM | POA: Diagnosis not present

## 2017-06-06 DIAGNOSIS — Z91040 Latex allergy status: Secondary | ICD-10-CM | POA: Insufficient documentation

## 2017-06-06 DIAGNOSIS — Z992 Dependence on renal dialysis: Secondary | ICD-10-CM | POA: Insufficient documentation

## 2017-06-06 DIAGNOSIS — R0602 Shortness of breath: Secondary | ICD-10-CM | POA: Diagnosis not present

## 2017-06-06 LAB — BASIC METABOLIC PANEL
Anion gap: 15 (ref 5–15)
BUN: 26 mg/dL — ABNORMAL HIGH (ref 6–20)
CHLORIDE: 95 mmol/L — AB (ref 101–111)
CO2: 29 mmol/L (ref 22–32)
CREATININE: 7.41 mg/dL — AB (ref 0.61–1.24)
Calcium: 9.3 mg/dL (ref 8.9–10.3)
GFR calc non Af Amer: 7 mL/min — ABNORMAL LOW (ref >60)
GFR, EST AFRICAN AMERICAN: 8 mL/min — AB (ref >60)
Glucose, Bld: 121 mg/dL — ABNORMAL HIGH (ref 65–99)
POTASSIUM: 4 mmol/L (ref 3.5–5.1)
SODIUM: 139 mmol/L (ref 135–145)

## 2017-06-06 LAB — I-STAT TROPONIN, ED: Troponin i, poc: 0.01 ng/mL (ref 0.00–0.08)

## 2017-06-06 LAB — CBC
HEMATOCRIT: 39.5 % (ref 39.0–52.0)
HEMOGLOBIN: 12.6 g/dL — AB (ref 13.0–17.0)
MCH: 26.8 pg (ref 26.0–34.0)
MCHC: 31.9 g/dL (ref 30.0–36.0)
MCV: 83.9 fL (ref 78.0–100.0)
PLATELETS: 117 10*3/uL — AB (ref 150–400)
RBC: 4.71 MIL/uL (ref 4.22–5.81)
RDW: 18.2 % — ABNORMAL HIGH (ref 11.5–15.5)
WBC: 6.7 10*3/uL (ref 4.0–10.5)

## 2017-06-06 MED ORDER — DOXYLAMINE SUCCINATE (SLEEP) 25 MG PO TABS: 12.5000 mg | ORAL_TABLET | Freq: Every day | ORAL | 0 refills | Status: DC

## 2017-06-06 NOTE — Discharge Instructions (Signed)
Contact a health care provider if: You are tired throughout the day or have trouble in your daily routine due to sleepiness. You continue to have sleep problems or your sleep problems get worse.

## 2017-06-06 NOTE — ED Triage Notes (Signed)
Pt states he had HD yesterday. He is MWF dialysis pt. Dialysis went well but last night began having chest pain with intermittent shortness of breath. No acute distress noted. Skin warm and dry, VSS.

## 2017-06-06 NOTE — ED Provider Notes (Signed)
[facility] DEPT Provider Note   CSN: [id number] Arrival date & time: 06/06/17  0702     History   Chief Complaint Chief Complaint  Patient presents with  . Shortness of Breath  . Chest Pain    HPI [name] [name] is a 61 y.o. male.he presents emergency Department with chief complaint of inability to sleep and sharp chest pains.patient was discharged on 28 August, 2018 after admission for acute metabolic encephalopathy. The patient states that he has had significant difficulty sleeping which is primarily with bothering him. He has not tried any medications to help him sleep. Patient states thatevery time he begins to fall asleep he started shaking and then wakes up. He states that it is very frustrating. Today he had several episodes of sharp, fleeting, less than 1 second long chest pain. He states that it is more irritating than anything else. He denies PND, orthopnea, dyspnea on exertion. He dialyzes Monday Wednesday Friday and dialyzed yesterday.   HPI  Past Medical History:  Diagnosis Date  . Anemia   . Anginal pain ([place])   . CAD (coronary artery disease)    a. per [facility] s/p 3.65mm x 65mm Vision BMS to mid LAD 12/2009 and Xience DES to mid LAD 10/2010.  [name] Chronic diastolic CHF (congestive heart failure) ([place])   . Colon polyps   . Daily headache   . ESRD on dialysis [facility]) since ~ 2008   "MWF; Jeneen Rinks" (03/04/2017)  . GERD (gastroesophageal reflux disease)   . Heart murmur   . Hematochezia    a. 2014: colonscopy, which showed moderately-sized internal hemorrhoids, two 100mm polyps in transverse colon and ascending colon that were resected, five 2-25mm polyps in sigmoid colon, descending colon, transverse colon, and ascending colon that were resected. An upper endoscopy was performed and showed normal esophagus, stomach, and duodenum.  . Hematuria    a. H/o hematuria 2014 with cystoscopy that was unrevealing for his source of hematuria. He underwent a kidney ultrasound  on 10/14 that showed mildly echogenic and scarred kidneys compatible with medical renal disease, without hydronephrosis or renal calculi.  [name] History of blood transfusion    "had colonoscopy done; they had to give me some blood"  . Hyperlipidemia   . Hypertension   . On home oxygen therapy    "2L prn" (07/21/2015); "been off it for awhile" (03/04/2017)  . Renal insufficiency   . 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 Type II diabetes mellitus [facility])     Patient Active Problem List   Diagnosis Date Noted  . Acute metabolic encephalopathy   . Skin ulcer of toe of right foot, limited to breakdown of skin ([place])   . Callus of foot 03/07/2017  . ESRD (end stage renal disease) ([place]) 03/18/2016  . Hypertensive heart disease with heart failure ([place])   . Mitral regurgitation   . Leukocytosis 12/23/2015  . Diabetic neuropathy ([place]) 07/29/2015  . Depression 07/22/2015  . GERD (gastroesophageal reflux disease) 06/04/2015  . Malnutrition of moderate degree ([place]) 05/17/2015  . Thrombocytopenia ([place]) 05/16/2015  . Weight loss 05/16/2015  . H/O TIA (transient ischemic attack) 04/01/2015  . Diastolic dysfunction-grade 2 12/13/2014  . Hypertension 04/27/2014  . CAD -S/P LAD BMS 2011, LAD DES 2012- patent cors Feb 2016 04/27/2014  . Diabetes mellitus type 2, diet-controlled ([place]) 04/27/2014    Past Surgical History:  Procedure Laterality Date  . AV FISTULA PLACEMENT Left ~ 2007   "upper  arm"  . CARDIAC CATHETERIZATION  "several"  . CORONARY ANGIOPLASTY WITH STENT PLACEMENT  "several"  . CYSTOSCOPY W/ STONE MANIPULATION  X2?  . EYE SURGERY Bilateral    "laser OR for hemorrhage"  . LEFT HEART CATHETERIZATION WITH CORONARY ANGIOGRAM N/A 11/23/2014   Procedure: LEFT HEART CATHETERIZATION WITH CORONARY ANGIOGRAM;  Surgeon: Troy Sine, MD;  Location: [facility] CATH LAB;  Service: Cardiovascular;  Laterality: N/A;  . LITHOTRIPSY  X1       Home Medications    Prior to  Admission medications   Medication Sig Start Date End Date Taking? Authorizing Provider  albuterol (PROVENTIL HFA;VENTOLIN HFA) 108 (90 Base) MCG/ACT inhaler Inhale 1-2 puffs into the lungs every 6 (six) hours as needed for wheezing or shortness of breath. Patient not taking: Reported on 06/02/2017 01/19/16   Delsa Grana, PA-C  amLODipine (NORVASC) 10 MG tablet Take 10 mg by mouth at bedtime. 05/03/17   [provider]  aspirin 81 MG chewable tablet Chew 1 tablet (81 mg total) by mouth daily. Patient not taking: Reported on 06/02/2017 07/22/15   Iline Oven, MD  atorvastatin (LIPITOR) 20 MG tablet Take 20 mg by mouth at bedtime. Reported on 03/14/2016    [provider]  carvedilol (COREG) 25 MG tablet Take 1 tablet (25 mg total) by mouth 2 (two) times daily with a meal. 02/08/17   Lorella Nimrod, MD  cinacalcet (SENSIPAR) 90 MG tablet Take 180 mg by mouth every Monday, Wednesday, and Friday with hemodialysis.     [provider]  doxylamine, Sleep, (UNISOM) 25 MG tablet Take 0.5-1 tablets (12.5-25 mg total) by mouth at bedtime. Take NO MORE THAN 25 mg for sleep every 24 hours. 06/06/17   Margarita Mail, PA-C  multivitamin (RENA-VIT) TABS tablet Take 1 tablet by mouth at bedtime. 12/15/14   Geradine Girt, DO  nitroGLYCERIN (NITROSTAT) 0.4 MG SL tablet Place 0.4 mg under the tongue every 5 (five) minutes as needed for chest pain.    [provider]  pantoprazole (PROTONIX) 40 MG tablet Take 1 tablet (40 mg total) by mouth daily. Patient not taking: Reported on 06/02/2017 03/07/17 03/07/18  Dellia Nims, MD  sevelamer carbonate (RENVELA) 800 MG tablet Take 4 tablets (3,200 mg total) by mouth 3 (three) times daily with meals. Patient not taking: Reported on 06/02/2017 03/12/16   Geradine Girt, DO    Family History Family History  Problem Relation Age of Onset  . Bone cancer Mother   . Anuerysm Father   . Hypertension Unknown   . Diabetes type II Daughter      Social History Social History  Substance Use Topics  . Smoking status: Former Smoker    Packs/day: 0.50    Years: 8.00    Types: Cigarettes    Quit date: 01/07/2011  . Smokeless tobacco: Never Used  . Alcohol use No     Allergies   Enalapril; Latex; and Tape   Review of Systems Review of Systems  Ten systems reviewed and are negative for acute change, except as noted in the HPI.   Physical Exam Updated Vital Signs BP (!) 167/82 (BP Location: Right Arm)   Pulse 71   Temp 98.3 F (36.8 C) (Oral)   Resp 18   Ht 6' (1.829 m)   Wt 86.2 kg (190 lb)   SpO2 100%   BMI 25.77 kg/m   Physical Exam  Constitutional: He appears well-developed and well-nourished. No distress.  HENT:  Head: Normocephalic and atraumatic.  Eyes: Pupils are equal, round, and reactive to light. Conjunctivae and EOM are normal. No scleral icterus.  Neck: Normal range of motion. Neck supple.  Cardiovascular: Normal rate, regular rhythm and normal heart sounds.    Left arm dialysis graft with thrill  Pulmonary/Chest: Effort normal and breath sounds normal. No respiratory distress.  Abdominal: Soft. There is no tenderness.  Musculoskeletal: He exhibits no edema.  Neurological: He is alert.  Skin: Skin is warm and dry. He is not diaphoretic.  Psychiatric: His behavior is normal.  Nursing note and vitals reviewed.    ED Treatments / Results  Labs (all labs ordered are listed, but only abnormal results are displayed) Labs Reviewed  BASIC METABOLIC PANEL - Abnormal; Notable for the following:       Result Value   Chloride 95 (*)    Glucose, Bld 121 (*)    BUN 26 (*)    Creatinine, Ser 7.41 (*)    GFR calc non Af Amer 7 (*)    GFR calc Af Amer 8 (*)    All other components within normal limits  CBC - Abnormal; Notable for the following:    Hemoglobin 12.6 (*)    RDW 18.2 (*)    Platelets 117 (*)    All other components within normal limits  I-STAT TROPONIN, ED    EKG  EKG  Interpretation  Date/Time:  Thursday June 06 2017 07:12:33 EDT Ventricular Rate:  79 PR Interval:  142 QRS Duration: 100 QT Interval:  414 QTC Calculation: 474 R Axis:   104 Text Interpretation:  Normal sinus rhythm Rightward axis Borderline ECG Confirmed by Virgel Manifold [phone]) on 06/06/2017 9:07:44 AM       Radiology Dg Chest 2 View  Result Date: 06/06/2017 CLINICAL DATA:  Shortness of Breath EXAM: CHEST  2 VIEW COMPARISON:  06/02/2017 FINDINGS: Heart is borderline in size. There is diffuse interstitial prominence throughout the lungs, improved since prior study, likely improving interstitial edema. No effusions. No acute bony abnormality. IMPRESSION: Improving interstitial edema. Electronically Signed   By: Rolm Baptise M.D.   On: 06/06/2017 07:45    Procedures Procedures (including critical care time)  Medications Ordered in ED Medications - No data to display   Initial Impression / Assessment and Plan / ED Course  I have reviewed the triage vital signs and the nursing notes.  Pertinent labs & imaging results that were available during my care of the patient were reviewed by me and considered in my medical decision making (see chart for details).     I have reviewed the patient's labs and imaging. I discussed the case with Dr. Leonel Ramsay who does not feel that this is a sequela of his encephalopathy. Will treat with unisom. Discussed need for close pcp follow up and return precautions.  Final Clinical Impressions(s) / ED Diagnoses   Final diagnoses:  Insomnia, unspecified type    New Prescriptions Discharge Medication List as of 06/06/2017 11:58 AM    START taking these medications   Details  doxylamine, Sleep, (UNISOM) 25 MG tablet Take 0.5-1 tablets (12.5-25 mg total) by mouth at bedtime. Take NO MORE THAN 25 mg for sleep every 24 hours., Starting Thu 06/06/2017, Print         Margarita Mail, PA-C 06/06/17 1656    Virgel Manifold, MD 06/17/17 1150

## 2017-06-07 ENCOUNTER — Inpatient Hospital Stay (HOSPITAL_COMMUNITY)
Admission: AD | Admit: 2017-06-07 | Discharge: 2017-06-08 | DRG: 313 | Disposition: A | Discharge status: Home / Self Care | Payer: Medicare Other | Source: Other Acute Inpatient Hospital | Attending: Internal Medicine | Admitting: Internal Medicine

## 2017-06-07 DIAGNOSIS — I5032 Chronic diastolic (congestive) heart failure: Secondary | ICD-10-CM | POA: Diagnosis present

## 2017-06-07 DIAGNOSIS — Z79899 Other long term (current) drug therapy: Secondary | ICD-10-CM

## 2017-06-07 DIAGNOSIS — Z7982 Long term (current) use of aspirin: Secondary | ICD-10-CM | POA: Diagnosis not present

## 2017-06-07 DIAGNOSIS — I2510 Atherosclerotic heart disease of native coronary artery without angina pectoris: Secondary | ICD-10-CM | POA: Diagnosis present

## 2017-06-07 DIAGNOSIS — R0602 Shortness of breath: Secondary | ICD-10-CM | POA: Diagnosis not present

## 2017-06-07 DIAGNOSIS — R9431 Abnormal electrocardiogram [ECG] [EKG]: Secondary | ICD-10-CM | POA: Diagnosis not present

## 2017-06-07 DIAGNOSIS — R079 Chest pain, unspecified: Secondary | ICD-10-CM | POA: Diagnosis present

## 2017-06-07 DIAGNOSIS — I771 Stricture of artery: Secondary | ICD-10-CM | POA: Diagnosis not present

## 2017-06-07 DIAGNOSIS — E1129 Type 2 diabetes mellitus with other diabetic kidney complication: Secondary | ICD-10-CM | POA: Diagnosis not present

## 2017-06-07 DIAGNOSIS — D509 Iron deficiency anemia, unspecified: Secondary | ICD-10-CM | POA: Diagnosis not present

## 2017-06-07 DIAGNOSIS — Z833 Family history of diabetes mellitus: Secondary | ICD-10-CM | POA: Diagnosis not present

## 2017-06-07 DIAGNOSIS — K219 Gastro-esophageal reflux disease without esophagitis: Secondary | ICD-10-CM | POA: Diagnosis present

## 2017-06-07 DIAGNOSIS — Z888 Allergy status to other drugs, medicaments and biological substances status: Secondary | ICD-10-CM

## 2017-06-07 DIAGNOSIS — Z91048 Other nonmedicinal substance allergy status: Secondary | ICD-10-CM

## 2017-06-07 DIAGNOSIS — R0789 Other chest pain: Secondary | ICD-10-CM | POA: Diagnosis present

## 2017-06-07 DIAGNOSIS — E875 Hyperkalemia: Secondary | ICD-10-CM | POA: Diagnosis not present

## 2017-06-07 DIAGNOSIS — Z91040 Latex allergy status: Secondary | ICD-10-CM

## 2017-06-07 DIAGNOSIS — I132 Hypertensive heart and chronic kidney disease with heart failure and with stage 5 chronic kidney disease, or end stage renal disease: Secondary | ICD-10-CM | POA: Diagnosis present

## 2017-06-07 DIAGNOSIS — E785 Hyperlipidemia, unspecified: Secondary | ICD-10-CM | POA: Diagnosis present

## 2017-06-07 DIAGNOSIS — I509 Heart failure, unspecified: Secondary | ICD-10-CM | POA: Diagnosis not present

## 2017-06-07 DIAGNOSIS — D631 Anemia in chronic kidney disease: Secondary | ICD-10-CM | POA: Diagnosis present

## 2017-06-07 DIAGNOSIS — D696 Thrombocytopenia, unspecified: Secondary | ICD-10-CM | POA: Diagnosis present

## 2017-06-07 DIAGNOSIS — E1122 Type 2 diabetes mellitus with diabetic chronic kidney disease: Secondary | ICD-10-CM | POA: Diagnosis present

## 2017-06-07 DIAGNOSIS — Z794 Long term (current) use of insulin: Secondary | ICD-10-CM | POA: Diagnosis not present

## 2017-06-07 DIAGNOSIS — N186 End stage renal disease: Secondary | ICD-10-CM | POA: Diagnosis not present

## 2017-06-07 DIAGNOSIS — Z992 Dependence on renal dialysis: Secondary | ICD-10-CM | POA: Diagnosis not present

## 2017-06-07 DIAGNOSIS — Z955 Presence of coronary angioplasty implant and graft: Secondary | ICD-10-CM

## 2017-06-07 DIAGNOSIS — I517 Cardiomegaly: Secondary | ICD-10-CM | POA: Diagnosis not present

## 2017-06-07 DIAGNOSIS — N2581 Secondary hyperparathyroidism of renal origin: Secondary | ICD-10-CM | POA: Diagnosis not present

## 2017-06-07 DIAGNOSIS — Z95818 Presence of other cardiac implants and grafts: Secondary | ICD-10-CM | POA: Diagnosis not present

## 2017-06-07 DIAGNOSIS — R110 Nausea: Secondary | ICD-10-CM | POA: Diagnosis not present

## 2017-06-07 DIAGNOSIS — I120 Hypertensive chronic kidney disease with stage 5 chronic kidney disease or end stage renal disease: Secondary | ICD-10-CM | POA: Diagnosis not present

## 2017-06-07 HISTORY — DX: Chest pain, unspecified: R07.9

## 2017-06-07 LAB — CULTURE, BLOOD (ROUTINE X 2)
Culture: NO GROWTH
Culture: NO GROWTH
SPECIAL REQUESTS: ADEQUATE
Special Requests: ADEQUATE

## 2017-06-07 MED ORDER — ACETAMINOPHEN 650 MG RE SUPP: 650.0000 mg | Freq: Four times a day (QID) | RECTAL | Status: DC | PRN

## 2017-06-07 MED ORDER — ATORVASTATIN CALCIUM 20 MG PO TABS
20.0000 mg | ORAL_TABLET | Freq: Every day | ORAL | Status: DC
  Administered 2017-06-07: 20 mg via ORAL
  Filled 2017-06-07: qty 1

## 2017-06-07 MED ORDER — ASPIRIN 81 MG PO CHEW
81.0000 mg | CHEWABLE_TABLET | Freq: Every day | ORAL | Status: DC
  Administered 2017-06-08: 81 mg via ORAL
  Filled 2017-06-07: qty 1

## 2017-06-07 MED ORDER — HEPARIN SODIUM (PORCINE) 5000 UNIT/ML IJ SOLN
5000.0000 [IU] | Freq: Three times a day (TID) | INTRAMUSCULAR | Status: DC
  Filled 2017-06-07: qty 1

## 2017-06-07 MED ORDER — CINACALCET HCL 30 MG PO TABS: 180.0000 mg | ORAL_TABLET | ORAL | Status: DC

## 2017-06-07 MED ORDER — AMLODIPINE BESYLATE 10 MG PO TABS
10.0000 mg | ORAL_TABLET | Freq: Every day | ORAL | Status: DC
  Administered 2017-06-07: 10 mg via ORAL
  Filled 2017-06-07: qty 1

## 2017-06-07 MED ORDER — NITROGLYCERIN 0.4 MG SL SUBL: 0.4000 mg | SUBLINGUAL_TABLET | SUBLINGUAL | Status: DC | PRN

## 2017-06-07 MED ORDER — SENNOSIDES-DOCUSATE SODIUM 8.6-50 MG PO TABS: 1.0000 | ORAL_TABLET | Freq: Every evening | ORAL | Status: DC | PRN

## 2017-06-07 MED ORDER — RENA-VITE PO TABS
1.0000 | ORAL_TABLET | Freq: Every day | ORAL | Status: DC
  Administered 2017-06-07: 1 via ORAL
  Filled 2017-06-07: qty 1

## 2017-06-07 MED ORDER — CARVEDILOL 25 MG PO TABS
25.0000 mg | ORAL_TABLET | Freq: Two times a day (BID) | ORAL | Status: DC
  Administered 2017-06-07 – 2017-06-08 (×2): 25 mg via ORAL
  Filled 2017-06-07 (×2): qty 1

## 2017-06-07 MED ORDER — SEVELAMER CARBONATE 800 MG PO TABS
3200.0000 mg | ORAL_TABLET | Freq: Three times a day (TID) | ORAL | Status: DC
  Administered 2017-06-08 (×2): 3200 mg via ORAL
  Filled 2017-06-07 (×2): qty 4

## 2017-06-07 MED ORDER — ACETAMINOPHEN 325 MG PO TABS: 650.0000 mg | ORAL_TABLET | Freq: Four times a day (QID) | ORAL | Status: DC | PRN

## 2017-06-07 MED ORDER — PANTOPRAZOLE SODIUM 40 MG PO TBEC
40.0000 mg | DELAYED_RELEASE_TABLET | Freq: Every day | ORAL | Status: DC
  Administered 2017-06-08: 40 mg via ORAL
  Filled 2017-06-07: qty 1

## 2017-06-08 ENCOUNTER — Encounter (HOSPITAL_COMMUNITY): Payer: Self-pay

## 2017-06-08 ENCOUNTER — Other Ambulatory Visit: Payer: Self-pay

## 2017-06-08 DIAGNOSIS — I2510 Atherosclerotic heart disease of native coronary artery without angina pectoris: Secondary | ICD-10-CM

## 2017-06-08 DIAGNOSIS — E1122 Type 2 diabetes mellitus with diabetic chronic kidney disease: Secondary | ICD-10-CM

## 2017-06-08 DIAGNOSIS — Z992 Dependence on renal dialysis: Secondary | ICD-10-CM

## 2017-06-08 DIAGNOSIS — E785 Hyperlipidemia, unspecified: Secondary | ICD-10-CM

## 2017-06-08 DIAGNOSIS — Z955 Presence of coronary angioplasty implant and graft: Secondary | ICD-10-CM

## 2017-06-08 DIAGNOSIS — I120 Hypertensive chronic kidney disease with stage 5 chronic kidney disease or end stage renal disease: Secondary | ICD-10-CM | POA: Diagnosis not present

## 2017-06-08 DIAGNOSIS — Z91048 Other nonmedicinal substance allergy status: Secondary | ICD-10-CM

## 2017-06-08 DIAGNOSIS — Z888 Allergy status to other drugs, medicaments and biological substances status: Secondary | ICD-10-CM

## 2017-06-08 DIAGNOSIS — R0789 Other chest pain: Principal | ICD-10-CM

## 2017-06-08 DIAGNOSIS — Z91040 Latex allergy status: Secondary | ICD-10-CM | POA: Diagnosis not present

## 2017-06-08 DIAGNOSIS — N186 End stage renal disease: Secondary | ICD-10-CM | POA: Diagnosis not present

## 2017-06-08 LAB — CBC
HEMATOCRIT: 36.3 % — AB (ref 39.0–52.0)
HEMOGLOBIN: 11.7 g/dL — AB (ref 13.0–17.0)
MCH: 26.3 pg (ref 26.0–34.0)
MCHC: 32.2 g/dL (ref 30.0–36.0)
MCV: 81.6 fL (ref 78.0–100.0)
Platelets: 130 10*3/uL — ABNORMAL LOW (ref 150–400)
RBC: 4.45 MIL/uL (ref 4.22–5.81)
RDW: 17.9 % — AB (ref 11.5–15.5)
WBC: 5.7 10*3/uL (ref 4.0–10.5)

## 2017-06-08 LAB — BASIC METABOLIC PANEL
Anion gap: 14 (ref 5–15)
BUN: 27 mg/dL — AB (ref 6–20)
CO2: 29 mmol/L (ref 22–32)
Calcium: 8.9 mg/dL (ref 8.9–10.3)
Chloride: 95 mmol/L — ABNORMAL LOW (ref 101–111)
Creatinine, Ser: 7.6 mg/dL — ABNORMAL HIGH (ref 0.61–1.24)
GFR calc Af Amer: 8 mL/min — ABNORMAL LOW (ref >60)
GFR, EST NON AFRICAN AMERICAN: 7 mL/min — AB (ref >60)
Glucose, Bld: 81 mg/dL (ref 65–99)
POTASSIUM: 4 mmol/L (ref 3.5–5.1)
SODIUM: 138 mmol/L (ref 135–145)

## 2017-06-08 LAB — TROPONIN I
Troponin I: 0.05 ng/mL (ref <0.03)
Troponin I: <0.03 ng/mL (ref <0.03)

## 2017-06-08 MED ORDER — NEPRO/CARBSTEADY PO LIQD
237.0000 mL | Freq: Two times a day (BID) | ORAL | Status: DC
  Filled 2017-06-08 (×4): qty 237

## 2017-06-08 MED ORDER — NEPRO/CARBSTEADY PO LIQD: 237.0000 mL | Freq: Two times a day (BID) | ORAL | 2 refills | Status: DC

## 2017-06-08 NOTE — Discharge Summary (Signed)
Name: [name] [name] MRN: [id number] DOB: 06-13-1956 61 y.o. PCP: Neva Seat, MD  Date of Admission: 06/07/2017 10:18 PM Date of Discharge: 06/08/2017 Attending Physician: Bartholomew Crews, MD  Discharge Diagnosis: 1. Atypical chest pain. Active Problems:   Chest pain   Discharge Medications: Allergies as of 06/08/2017      Reactions   Enalapril Hives   Latex Rash   Tape Rash   TAPE MAKES SKIN BREAK OUT AND TURN RED      Medication List    TAKE these medications   albuterol 108 (90 Base) MCG/ACT inhaler Commonly known as:  PROVENTIL HFA;VENTOLIN HFA Inhale 1-2 puffs into the lungs every 6 (six) hours as needed for wheezing or shortness of breath.   amLODipine 10 MG tablet Commonly known as:  NORVASC Take 10 mg by mouth at bedtime.   aspirin 81 MG chewable tablet Chew 1 tablet (81 mg total) by mouth daily.   atorvastatin 20 MG tablet Commonly known as:  LIPITOR Take 20 mg by mouth at bedtime. Reported on 03/14/2016   carvedilol 25 MG tablet Commonly known as:  COREG Take 1 tablet (25 mg total) by mouth 2 (two) times daily with a meal.   cinacalcet 90 MG tablet Commonly known as:  SENSIPAR Take 180 mg by mouth every Monday, Wednesday, and Friday with hemodialysis.   doxylamine (Sleep) 25 MG tablet Commonly known as:  UNISOM Take 0.5-1 tablets (12.5-25 mg total) by mouth at bedtime. Take NO MORE THAN 25 mg for sleep every 24 hours.   feeding supplement (NEPRO CARB STEADY) Liqd Take 237 mLs by mouth 2 (two) times daily between meals.   multivitamin Tabs tablet Take 1 tablet by mouth at bedtime.   nitroGLYCERIN 0.4 MG SL tablet Commonly known as:  NITROSTAT Place 0.4 mg under the tongue every 5 (five) minutes as needed for chest pain.   pantoprazole 40 MG tablet Commonly known as:  PROTONIX Take 1 tablet (40 mg total) by mouth daily.   sevelamer carbonate 800 MG tablet Commonly known as:  RENVELA Take 4 tablets (3,200 mg total) by mouth 3 (three)  times daily with meals.            Discharge Care Instructions        Start     Ordered   06/08/17 0000  Nutritional Supplements (FEEDING SUPPLEMENT, NEPRO CARB STEADY,) LIQD  2 times daily between meals     06/08/17 1227   06/08/17 0000  Increase activity slowly     06/08/17 1227   06/08/17 0000  Diet - low sodium heart healthy     06/08/17 1227   06/08/17 0000  Discharge instructions    Comments:  It was pleasure taking care of you. As we discuss, please make an appointment with your heart doctor as soon as possible. Also call internal medicine clinic to make an hospital follow-up appointment to be seen in one week. Please take your medicines as directed and follow with you at dialysis schedule.   06/08/17 1227      Disposition and follow-up:   [name] was discharged from [facility] in Good condition.  At the hospital follow up visit please address:  1.  Anymore episodes of chest pain,asked whether he made an appointment with his cardiologist at [facility]. Also see if his blood pressure is well controlled.  2.  Labs / imaging needed at time of follow-up: None  3.  Pending labs/ test needing follow-up: None  Follow-up  Appointments:   Hospital Course by problem list:  Patient is a 61 yo M with a pmhx significant for HTN, HLD, diet controlled type II DM, ESRD on HD, and CAD s/p PCI x 2 presenting with chest pain x 1 day, now resolved with ASA and transdermal nitro.   Atypical chest pain. He was having atypical chest pain. Point-of-care troponin in ED was 0.05, it was negative on 2 subsequent checks. EKG remained without any changes. He remained chest pain-free during his stay in hospital. According to patient he do get this mild pressure-like intermittent chest pain which last for a few seconds for a long time. He was advised to contact his cardiologist at [facility] for a follow-up visit and talk about these chest pains to see if he needs anymore outpatient  workup. He was advised to continue home dose of aspirin and Lipitor.  ESRD on QB:VQXIHWT is compliant with his dialysis schedule on Monday, Wednesday and Friday. -Continue with his scheduled dialysis. -Continue with Sensipar and Renvela   UUE:KCMKL pressure was mildly elevated during his stay in hospital. He was advised to continue his home dose of amlodipine and Coreg. He will need reassessment on follow-up visit.  Discharge Vitals:   BP (!) 151/75 (BP Location: Right Arm)   Pulse 66   Temp 98.2 F (36.8 C) (Oral)   Resp 20   Ht 6' (1.829 m)   Wt 187 lb (84.8 kg) Comment: b scale  SpO2 100%   BMI 25.36 kg/m   Gen.well-developed gentleman, sitting comfortably in chair, in no acute distress. Chest.clear bilaterally CV. regular rate and rhythm. Abdomen. Soft, non tender, bowel sounds positive. Extremities.no edema, no cyanosis, pulses intact and symmetrical.  Pertinent Labs, Studies, and Procedures:  CBC Latest Ref Rng & Units 06/08/2017 06/06/2017 06/03/2017  WBC 4.0 - 10.5 K/uL 5.7 6.7 6.5  Hemoglobin 13.0 - 17.0 g/dL 11.7(L) 12.6(L) 10.8(L)  Hematocrit 39.0 - 52.0 % 36.3(L) 39.5 32.4(L)  Platelets 150 - 400 K/uL 130(L) 117(L) 114(L)   BMP Latest Ref Rng & Units 06/08/2017 06/06/2017 06/03/2017  Glucose 65 - 99 mg/dL 81 121(H) 60(L)  BUN 6 - 20 mg/dL 27(H) 26(H) 41(H)  Creatinine 0.61 - 1.24 mg/dL 7.60(H) 7.41(H) 9.65(H)  Sodium 135 - 145 mmol/L 138 139 140  Potassium 3.5 - 5.1 mmol/L 4.0 4.0 4.4  Chloride 101 - 111 mmol/L 95(L) 95(L) 98(L)  CO2 22 - 32 mmol/L 29 29 27   Calcium 8.9 - 10.3 mg/dL 8.9 9.3 9.8   Troponin. 0.05>><0.03  >> <0.03  EKG: personally reviewed my interpretation is normal sinus rhythm at 79 BPM, nonischemic.  Discharge Instructions: Discharge Instructions    Diet - low sodium heart healthy    Complete by:  As directed    Discharge instructions    Complete by:  As directed    It was pleasure taking care of you. As we discuss, please make an  appointment with your heart doctor as soon as possible. Also call internal medicine clinic to make an hospital follow-up appointment to be seen in one week. Please take your medicines as directed and follow with you at dialysis schedule.   Increase activity slowly    Complete by:  As directed       Signed: Lorella Nimrod, MD 06/08/2017, 1:22 PM   Pager: [phone]

## 2017-06-08 NOTE — Progress Notes (Signed)
Pt refused to wait for case management.

## 2017-06-08 NOTE — Progress Notes (Signed)
CRITICAL VALUE ALERT  Critical Value:  Troponin 0.05  Date & Time Notied:  2308  Provider Notified: [id number]  Orders Received/Actions taken: No new orders. Continue to monitor patient.

## 2017-06-08 NOTE — H&P (Signed)
Date: 06/08/2017               Patient Name:  [name] [name] MRN: [id number]  DOB: 1956/07/13 Age / Sex: 61 y.o., male   PCP: Neva Seat, MD         Medical Service: Internal Medicine Teaching Service         Attending Physician: Dr. Bartholomew Crews, MD    First Contact: Dr. Berline Lopes Pager: [phone]  Second Contact: Dr. Reesa Chew Pager: [phone]       After Hours (After 5p/  First Contact Pager: [phone]  weekends / holidays): Second Contact Pager: [phone]   Chief Complaint: Chest Pain  History of Present Illness: [name] [name] is a 60 yo M with a history of CAD (s/p BMS to LAD in 2011 and DES to LAD in 2012); ESRD on HD MWF; CHFpEF; T2DM (diet controlled); HTN; and GERD who presents with Chest pain. He states the pain started in the morning and he describes it as a sharp 3-7/10 pain that intermittently would radiate to his bilateral ribs. The pain is exacerbated by certain positions, but not exertion. The pain is not particularly relieved by rest, but was relieved by nitroglycerin as well as ASA and tylenol. He also state that he feels this pain is similar to previous cardiac chest pain. He felt the pain throughout the day including during his dialysis sessions. He was sent from his dialysis center (where he was able to complete all but 10 minutes of his session) to the ED of an OSH. Of note, he did not take his medications this morning.  Upon arrival to the OSH ED, patient was afebrile T 98.71F and hemodynamically stable with BP 166/82, HR 76, RR 17, and oxygen 99% on RA. EKG was non ischemic with no significant changes from prior tracing three days prior. I-stat troponin was 0.01. BMP with sodium 139, potassium 4.0. Creatinine 7.41 and BUN 26. CBC unremarkable aside from chronic thrombocytopenia and normocytic anemia at baseline of 12. Patient was transferred to [facility] in anticipation of inpatient dialysis needs. Chest pain improved with application of transdermal nitroglycerin patch  and had resolved on arrival. He was admitted to telemetry for monitoring.   Meds:  No current facility-administered medications on file prior to encounter.    Current Outpatient Prescriptions on File Prior to Encounter  Medication Sig  . albuterol (PROVENTIL HFA;VENTOLIN HFA) 108 (90 Base) MCG/ACT inhaler Inhale 1-2 puffs into the lungs every 6 (six) hours as needed for wheezing or shortness of breath. (Patient not taking: Reported on 06/02/2017)  . amLODipine (NORVASC) 10 MG tablet Take 10 mg by mouth at bedtime.  [name] aspirin 81 MG chewable tablet Chew 1 tablet (81 mg total) by mouth daily. (Patient not taking: Reported on 06/02/2017)  . atorvastatin (LIPITOR) 20 MG tablet Take 20 mg by mouth at bedtime. Reported on 03/14/2016  . carvedilol (COREG) 25 MG tablet Take 1 tablet (25 mg total) by mouth 2 (two) times daily with a meal.  . cinacalcet (SENSIPAR) 90 MG tablet Take 180 mg by mouth every Monday, Wednesday, and Friday with hemodialysis.   [name] doxylamine, Sleep, (UNISOM) 25 MG tablet Take 0.5-1 tablets (12.5-25 mg total) by mouth at bedtime. Take NO MORE THAN 25 mg for sleep every 24 hours.  . multivitamin (RENA-VIT) TABS tablet Take 1 tablet by mouth at bedtime.  . nitroGLYCERIN (NITROSTAT) 0.4 MG SL tablet Place 0.4 mg under the tongue every 5 (five) minutes as needed for  chest pain.  . pantoprazole (PROTONIX) 40 MG tablet Take 1 tablet (40 mg total) by mouth daily. (Patient not taking: Reported on 06/02/2017)  . sevelamer carbonate (RENVELA) 800 MG tablet Take 4 tablets (3,200 mg total) by mouth 3 (three) times daily with meals. (Patient not taking: Reported on 06/02/2017)     Allergies: Allergies as of 06/07/2017 - Review Complete 06/06/2017  Allergen Reaction Noted  . Enalapril Hives 04/26/2014  . Latex Rash 09/23/2016  . Tape Rash 09/23/2016   Past Medical History:  Diagnosis Date  . Anemia   . Anginal pain ([place])   . CAD (coronary artery disease)    a. per [facility] s/p 3.68mm x  22mm Vision BMS to mid LAD 12/2009 and Xience DES to mid LAD 10/2010.  [name] Chronic diastolic CHF (congestive heart failure) ([place])   . Colon polyps   . Daily headache   . ESRD on dialysis [facility]) since ~ 2008   "MWF; [facility]" (03/04/2017)  . GERD (gastroesophageal reflux disease)   . Heart murmur   . Hematochezia    a. 2014: colonscopy, which showed moderately-sized internal hemorrhoids, two 54mm polyps in transverse colon and ascending colon that were resected, five 2-34mm polyps in sigmoid colon, descending colon, transverse colon, and ascending colon that were resected. An upper endoscopy was performed and showed normal esophagus, stomach, and duodenum.  . Hematuria    a. H/o hematuria 2014 with cystoscopy that was unrevealing for his source of hematuria. He underwent a kidney ultrasound on 10/14 that showed mildly echogenic and scarred kidneys compatible with medical renal disease, without hydronephrosis or renal calculi.  [name] History of blood transfusion    "had colonoscopy done; they had to give me some blood"  . Hyperlipidemia   . Hypertension   . On home oxygen therapy    "2L prn" (07/21/2015); "been off it for awhile" (03/04/2017)  . Renal insufficiency   . 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 Type II diabetes mellitus (HCC)     Family History:  Family History  Problem Relation Age of Onset  . Bone cancer Mother   . Anuerysm Father   . Hypertension Unknown   . Diabetes type II Daughter   - Reviewed on admission  Social History: - Denies tobacco, ETOH, or Illicit drug use. - Lives with wife  Review of Systems: A complete ROS was negative except as per HPI.  Physical Exam: Blood pressure (!) 155/72, pulse 66, temperature 97.9 F (36.6 C), temperature source Oral, resp. rate 18, height 6' (1.829 m), weight 184 lb 9.6 oz (83.7 kg), SpO2 98 %.  Physical Exam  Constitutional: He is oriented to person, place, and time. He appears well-developed and  well-nourished. No distress.  HENT:  Head: Normocephalic.  Eyes: EOM are normal. Right eye exhibits no discharge. Left eye exhibits no discharge.  Cardiovascular: Normal rate, regular rhythm, normal heart sounds and intact distal pulses.   Pulmonary/Chest: Effort normal and breath sounds normal. No respiratory distress. He has no wheezes.  Abdominal: Soft. Bowel sounds are normal. He exhibits no distension. There is no tenderness.  Musculoskeletal: He exhibits no edema or deformity.  Neurological: He is alert and oriented to person, place, and time.  Skin: Skin is warm and dry.  Psychiatric: He has a normal mood and affect.    EKG: personally reviewed my interpretation is normal sinus rhythm at 79 BPM, nonischemic.  Assessment & Plan by Problem:  Patient  is a 61 yo M with a pmhx significant for HTN, HLD, diet controlled type II DM, ESRD on HD, and CAD s/p PCI x 2 presenting with chest pain x 1 day, now resolved with ASA and transdermal nitro.   Chest Pain: Atypical in nature however patient with multiple coronary risk factors including known CAD s/p PCI to LAD with BMS in 2011 followed by LAD DES in 2012. Last cath in feb of 2016 with patent coronaries. Initial EKG non ischemic, troponin 0.05 on admission.  - Consider cardiology consult in AM - Trending troponins q6h  - F/u repeat EKG - SL nitro 0.4 mg q5 minutes prn  - Continue home ASA and lipitor  - Tele monitoring   ESRD on HD: Last dialysis 8/30, stopped 10 minutes early. BP only mildly elevated and electrolytes are stable. Appears euvolemic. No urgent HD need.  - Consult nephro if patient stays for ischemic eval  - Continue Sensipar and Renvela   HTN: - Continue home amlodipine 10 mg daily - Continue home Coreg 25 mg BID  FEN: Fluid restrict, replete lytes prn, renal diet VTE ppx: Heparin  Code Status: FULL   Dispo: Admit patient to Inpatient with expected length of stay greater than 2 midnights.

## 2017-06-08 NOTE — Progress Notes (Signed)
Nutrition Brief Note  Patient identified on the Malnutrition Screening Tool (MST) Report  Wt Readings from Last 15 Encounters:  06/08/17 187 lb (84.8 kg)  06/06/17 190 lb (86.2 kg)  06/04/17 186 lb 15.2 oz (84.8 kg)  03/07/17 198 lb 14.4 oz (90.2 kg)  03/05/17 188 lb 3.2 oz (85.4 kg)  02/08/17 193 lb 9 oz (87.8 kg)  09/23/16 198 lb 10.2 oz (90.1 kg)  08/29/16 198 lb 3.1 oz (89.9 kg)  04/14/16 207 lb (93.9 kg)  04/13/16 203 lb 14.8 oz (92.5 kg)  04/02/16 207 lb 6.4 oz (94.1 kg)  03/27/16 195 lb 15.8 oz (88.9 kg)  03/18/16 196 lb 13.9 oz (89.3 kg)  03/14/16 207 lb 6.4 oz (94.1 kg)  03/11/16 196 lb (88.9 kg)   Body mass index is 25.36 kg/m. Patient meets criteria for overweight based on current BMI.   Pt reports a brief period of poor intake "when I had my stroke". When asked when this was, he said 2 weeks ago. Assume that he is referencing his admission for AMS just earlier this week. He was just discharged on the 28th. He was evaluated by an RD that admission. Not diagnosed with malnutrition.    Says nothing has changed since then.   Weight at time of discharge on 8/28 was 187 lbs. His current weight is just slightly higher than this.   He is relatively poor historian and does not know his UBW. His wt had been very stable at 195-205 for 1 year prior to that encounter.   He says he needs to "start eating right". He was referring to sodium intake. RD reviewed some high sodium items that he should avoid.   He says his appetite is good right now. His diet was just advanced. He says he will likely go home today.   No nutrition interventions warranted at this time. If nutrition issues arise, please consult RD.   Burtis Junes RD, LDN, CNSC Clinical Nutrition Pager: [phone] 06/08/2017 11:29 AM

## 2017-06-08 NOTE — Progress Notes (Addendum)
   Subjective: Patient was feeling better when seen this morning.he denies any more chest pain.According to patient he had decreased nonspecific right-sided intermittent chest pains ,feels like a little pressure-like pinch,nonradiating, no relationship with deep breathing or body movements, lasting for a few seconds. He denies any associated dyspnea or diaphoresis. Patient want to be discharged,he wants to follow-up with his cardiologist at [facility] as an outpatient for any further workup if needed.  Objective:  Vital signs in last 24 hours: Vitals:   06/07/17 2238 06/08/17 0709  BP: (!) 155/72 (!) 141/61  Pulse: 66 77  Resp: 18 20  Temp: 97.9 F (36.6 C) 98.3 F (36.8 C)  TempSrc: Oral Oral  SpO2: 98% 95%  Weight: 184 lb 9.6 oz (83.7 kg) 187 lb (84.8 kg)  Height: 6' (1.829 m)    Gen.well-developed gentleman, sitting comfortably in chair, in no acute distress. Chest.clear bilaterally CV. regular rate and rhythm. Abdomen. Soft, non tender, bowel sounds positive. Extremities.no edema, no cyanosis, pulses intact and symmetrical.  Assessment/Plan:  Patient is a 61 yo M with a pmhx significant for HTN, HLD, diet controlled type II DM, ESRD on HD, and CAD s/p PCI x 2 presenting with chest pain x 1 day, now resolved with ASA and transdermal nitro.   Chest pain. He has atypical chest pain. His troponin become negative X 2. EKG is without any acute change. He was chest pain-free since his arrival at [facility]. He wants to be discharged. -We will discharge patient today. -He was advised to make an appointment with his cardiologist at [facility] as soon as possible for as outpatient work up if needed. -continue home dose of aspirin and Lipitor.  ESRD on TU:UEKCMKL is compliant with his dialysis schedule on Monday, Wednesday and Friday. -Continue with his scheduled dialysis. -Continue with Sensipar and Renvela   HTN: - Continue home amlodipine 10 mg daily - Continue home Coreg 25 mg  BID  Dispo: Being discharged today.  Lorella Nimrod, MD 06/08/2017, 11:58 AM Pager: [phone]

## 2017-06-10 DIAGNOSIS — D509 Iron deficiency anemia, unspecified: Secondary | ICD-10-CM | POA: Diagnosis not present

## 2017-06-10 DIAGNOSIS — N2581 Secondary hyperparathyroidism of renal origin: Secondary | ICD-10-CM | POA: Diagnosis not present

## 2017-06-10 DIAGNOSIS — D631 Anemia in chronic kidney disease: Secondary | ICD-10-CM | POA: Diagnosis not present

## 2017-06-10 DIAGNOSIS — N186 End stage renal disease: Secondary | ICD-10-CM | POA: Diagnosis not present

## 2017-06-10 DIAGNOSIS — E1129 Type 2 diabetes mellitus with other diabetic kidney complication: Secondary | ICD-10-CM | POA: Diagnosis not present

## 2017-06-11 LAB — GLUCOSE, CAPILLARY: GLUCOSE-CAPILLARY: 95 mg/dL (ref 65–99)

## 2017-06-12 DIAGNOSIS — E1129 Type 2 diabetes mellitus with other diabetic kidney complication: Secondary | ICD-10-CM | POA: Diagnosis not present

## 2017-06-12 DIAGNOSIS — N186 End stage renal disease: Secondary | ICD-10-CM | POA: Diagnosis not present

## 2017-06-12 DIAGNOSIS — N2581 Secondary hyperparathyroidism of renal origin: Secondary | ICD-10-CM | POA: Diagnosis not present

## 2017-06-12 DIAGNOSIS — D631 Anemia in chronic kidney disease: Secondary | ICD-10-CM | POA: Diagnosis not present

## 2017-06-12 DIAGNOSIS — D509 Iron deficiency anemia, unspecified: Secondary | ICD-10-CM | POA: Diagnosis not present

## 2017-06-13 ENCOUNTER — Encounter: Payer: Self-pay | Admitting: Internal Medicine

## 2017-06-13 ENCOUNTER — Ambulatory Visit (INDEPENDENT_AMBULATORY_CARE_PROVIDER_SITE_OTHER): Payer: Medicare Other | Admitting: Internal Medicine

## 2017-06-13 VITALS — BP 133/70 | HR 73 | Temp 97.9°F | Ht 72.0 in | Wt 198.1 lb

## 2017-06-13 DIAGNOSIS — D696 Thrombocytopenia, unspecified: Secondary | ICD-10-CM

## 2017-06-13 DIAGNOSIS — I132 Hypertensive heart and chronic kidney disease with heart failure and with stage 5 chronic kidney disease, or end stage renal disease: Secondary | ICD-10-CM

## 2017-06-13 DIAGNOSIS — Z9861 Coronary angioplasty status: Secondary | ICD-10-CM

## 2017-06-13 DIAGNOSIS — Z992 Dependence on renal dialysis: Secondary | ICD-10-CM | POA: Diagnosis not present

## 2017-06-13 DIAGNOSIS — I10 Essential (primary) hypertension: Secondary | ICD-10-CM

## 2017-06-13 DIAGNOSIS — Z955 Presence of coronary angioplasty implant and graft: Secondary | ICD-10-CM

## 2017-06-13 DIAGNOSIS — N186 End stage renal disease: Secondary | ICD-10-CM | POA: Diagnosis not present

## 2017-06-13 DIAGNOSIS — G3184 Mild cognitive impairment, so stated: Secondary | ICD-10-CM | POA: Diagnosis not present

## 2017-06-13 DIAGNOSIS — I5032 Chronic diastolic (congestive) heart failure: Secondary | ICD-10-CM | POA: Diagnosis not present

## 2017-06-13 DIAGNOSIS — I2510 Atherosclerotic heart disease of native coronary artery without angina pectoris: Secondary | ICD-10-CM | POA: Diagnosis not present

## 2017-06-13 NOTE — Patient Instructions (Signed)
Thank you for your visit today  We have referred you to Hematology (blood doctor)- clinic will call you to make an appointment here.  Please make sure you follow up with your cardiologist   Please follow up in 2 months

## 2017-06-13 NOTE — Progress Notes (Signed)
    CC: Hospital follow up for chest pain, thrombocytopenia, memory concern, HTN HPI: [name] is a 61 y.o. man with PMH noted below here for Hospital follow up for chest pain, thrombocytopenia, memory concern, HTN  Please see Problem List/A&P for the status of the patient's chronic medical problems   Past Medical History:  Diagnosis Date  . Anemia   . Anginal pain ([place])   . CAD (coronary artery disease)    a. per [facility] s/p 3.41mm x 53mm Vision BMS to mid LAD 12/2009 and Xience DES to mid LAD 10/2010.  [name] Chronic diastolic CHF (congestive heart failure) ([place])   . Colon polyps   . Daily headache   . ESRD on dialysis [facility]) since ~ 2008   "MWF; [facility]" (03/04/2017)  . GERD (gastroesophageal reflux disease)   . Heart murmur   . Hematochezia    a. 2014: colonscopy, which showed moderately-sized internal hemorrhoids, two 68mm polyps in transverse colon and ascending colon that were resected, five 2-64mm polyps in sigmoid colon, descending colon, transverse colon, and ascending colon that were resected. An upper endoscopy was performed and showed normal esophagus, stomach, and duodenum.  . Hematuria    a. H/o hematuria 2014 with cystoscopy that was unrevealing for his source of hematuria. He underwent a kidney ultrasound on 10/14 that showed mildly echogenic and scarred kidneys compatible with medical renal disease, without hydronephrosis or renal calculi.  [name] History of blood transfusion    "had colonoscopy done; they had to give me some blood"  . Hyperlipidemia   . Hypertension   . On home oxygen therapy    "2L prn" (07/21/2015); "been off it for awhile" (03/04/2017)  . Renal insufficiency   . 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 Type II diabetes mellitus ([place])     Review of Systems: Denies fevers, chills, weight loss, fatigue. Denies any falls or confusion since being discharged Denies any headaches, chest pain, dyspnea or wheezing Denies  n/v/abd pain Denies myalgias   Physical Exam: Vitals:   06/13/17 1046  BP: 133/70  Pulse: 73  Temp: 97.9 F (36.6 C)  TempSrc: Oral  SpO2: 100%  Weight: 198 lb 1.6 oz (89.9 kg)  Height: 6' (1.829 m)    General: A&O, in NAD Neck: supple, midline trachea CV: RRR, normal s1, s2, no m/r/g Resp: equal and symmetric breath sounds, no wheezing or crackles  Abdomen: soft, nontender, nondistended, +BS Extremities: pulses intact b/l, no edema, Neurologic: no focal neurological deficits. A&O x 3. Short term recall is intact  MMSE: score of 25. Unable to spell world backwards (only got the first letteR), and draw intersecting pentagon   Assessment & Plan:   See encounters tab for problem based medical decision making. Patient discussed with Dr. Daryll Drown

## 2017-06-13 NOTE — Assessment & Plan Note (Addendum)
Patient and his wife/daughter on the phone were concerned about the patient's memory. He was recently admitted for altered mental status and it was determined that lyrica and flexeril were causing his symptoms. They resolved after he was dialysed and after those medications were stopped. He denies further episodes of confusion since being discharged.  He has not taken further lyrica or flexeril.  Patient says that he has some trouble with long term memory, but seems to be ok with short term memory. Family on the phone was concerned about the recent episode of confusion which I explained was due to polypharmacy in the setting of ESRD and that given that he has not had further episodes of confusion was reassuring.   He had a recent CT head which was overall unremarkable.   A MMSE was done and he scored a 25 as he was unable to spell world backwards and draw intersecting pentagons.  Patient says that his education is until 12th grade.   A: Likely mild cognitive impairment   Plan -continue to monitor, will likely need further testing with [facility] at next visit.  -can consider checking B12 and RPR at next visit

## 2017-06-13 NOTE — Assessment & Plan Note (Signed)
Patient is continuing his hemodialysis and last went yesterday.  Plan -continue HD

## 2017-06-13 NOTE — Assessment & Plan Note (Signed)
Patient is compliant with his coreg, amlodipine. His blood pressure was at goal today. BP Readings from Last 3 Encounters:  06/13/17 133/70  06/08/17 (!) 151/75  06/06/17 (!) 167/82   Plan -continue coreg, and amlodipine

## 2017-06-13 NOTE — Assessment & Plan Note (Signed)
On review of his chart, patient is noted to have thrombocytopenia going back starting early 2017 and platelets have been stable in the 110-140. He denies any personal or family history of abnormal bleeding or clotting.  Review of medication does not point to a medication etiology for his thrombocytopenia. His HIV was checked and was negative in 2018. Hep C was negative in 2016. PT/INR and aPTT were normal in 2017, and his LFTs were also unremarkable recently.  His B12 was recently checked and was in the 300s so slightly on the lower side. 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 Result from 2017 not available.  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   A: Chronic thrombocytopenia, etiology unknown currently  Plan -Explained that we will need to refer him to Hematology for further workup, and also repeat smear to be obtained at that time as there may be other blood work that may needed -follow up with Hematology

## 2017-06-13 NOTE — Assessment & Plan Note (Addendum)
Patient is compliant with aspirin, statin, and coreg. He is here for hospital follow up after being admitted for atypical chest pain, and cardiac etiology was ruled out. Today, he says he is feeling well and he denies any chest pain or dyspnea.  Plan -continue aspirin, statin and coreg -Pt is supposed to follow up with cardiology- he knows about the appt at [facility]

## 2017-06-14 DIAGNOSIS — N2581 Secondary hyperparathyroidism of renal origin: Secondary | ICD-10-CM | POA: Diagnosis not present

## 2017-06-14 DIAGNOSIS — D631 Anemia in chronic kidney disease: Secondary | ICD-10-CM | POA: Diagnosis not present

## 2017-06-14 DIAGNOSIS — N186 End stage renal disease: Secondary | ICD-10-CM | POA: Diagnosis not present

## 2017-06-14 DIAGNOSIS — D509 Iron deficiency anemia, unspecified: Secondary | ICD-10-CM | POA: Diagnosis not present

## 2017-06-14 DIAGNOSIS — E1129 Type 2 diabetes mellitus with other diabetic kidney complication: Secondary | ICD-10-CM | POA: Diagnosis not present

## 2017-06-17 DIAGNOSIS — D631 Anemia in chronic kidney disease: Secondary | ICD-10-CM | POA: Diagnosis not present

## 2017-06-17 DIAGNOSIS — E1129 Type 2 diabetes mellitus with other diabetic kidney complication: Secondary | ICD-10-CM | POA: Diagnosis not present

## 2017-06-17 DIAGNOSIS — N186 End stage renal disease: Secondary | ICD-10-CM | POA: Diagnosis not present

## 2017-06-17 DIAGNOSIS — N2581 Secondary hyperparathyroidism of renal origin: Secondary | ICD-10-CM | POA: Diagnosis not present

## 2017-06-17 DIAGNOSIS — D509 Iron deficiency anemia, unspecified: Secondary | ICD-10-CM | POA: Diagnosis not present

## 2017-06-17 NOTE — Progress Notes (Signed)
Internal Medicine Clinic Attending  Case discussed with Dr. Saraiya at the time of the visit.  We reviewed the resident's history and exam and pertinent patient test results.  I agree with the assessment, diagnosis, and plan of care documented in the resident's note.  

## 2017-06-19 DIAGNOSIS — N2581 Secondary hyperparathyroidism of renal origin: Secondary | ICD-10-CM | POA: Diagnosis not present

## 2017-06-19 DIAGNOSIS — D631 Anemia in chronic kidney disease: Secondary | ICD-10-CM | POA: Diagnosis not present

## 2017-06-19 DIAGNOSIS — D509 Iron deficiency anemia, unspecified: Secondary | ICD-10-CM | POA: Diagnosis not present

## 2017-06-19 DIAGNOSIS — E1129 Type 2 diabetes mellitus with other diabetic kidney complication: Secondary | ICD-10-CM | POA: Diagnosis not present

## 2017-06-19 DIAGNOSIS — N186 End stage renal disease: Secondary | ICD-10-CM | POA: Diagnosis not present

## 2017-06-20 ENCOUNTER — Encounter (HOSPITAL_BASED_OUTPATIENT_CLINIC_OR_DEPARTMENT_OTHER): Payer: Medicare Other | Attending: Internal Medicine

## 2017-06-20 DIAGNOSIS — Z8611 Personal history of tuberculosis: Secondary | ICD-10-CM | POA: Diagnosis not present

## 2017-06-20 DIAGNOSIS — I509 Heart failure, unspecified: Secondary | ICD-10-CM | POA: Insufficient documentation

## 2017-06-20 DIAGNOSIS — E11621 Type 2 diabetes mellitus with foot ulcer: Secondary | ICD-10-CM | POA: Diagnosis not present

## 2017-06-20 DIAGNOSIS — I132 Hypertensive heart and chronic kidney disease with heart failure and with stage 5 chronic kidney disease, or end stage renal disease: Secondary | ICD-10-CM | POA: Insufficient documentation

## 2017-06-20 DIAGNOSIS — E1142 Type 2 diabetes mellitus with diabetic polyneuropathy: Secondary | ICD-10-CM | POA: Insufficient documentation

## 2017-06-20 DIAGNOSIS — L97511 Non-pressure chronic ulcer of other part of right foot limited to breakdown of skin: Secondary | ICD-10-CM | POA: Insufficient documentation

## 2017-06-20 DIAGNOSIS — E1122 Type 2 diabetes mellitus with diabetic chronic kidney disease: Secondary | ICD-10-CM | POA: Insufficient documentation

## 2017-06-20 DIAGNOSIS — I2510 Atherosclerotic heart disease of native coronary artery without angina pectoris: Secondary | ICD-10-CM | POA: Diagnosis not present

## 2017-06-20 DIAGNOSIS — Z992 Dependence on renal dialysis: Secondary | ICD-10-CM | POA: Insufficient documentation

## 2017-06-20 DIAGNOSIS — N186 End stage renal disease: Secondary | ICD-10-CM | POA: Diagnosis not present

## 2017-06-21 DIAGNOSIS — E1129 Type 2 diabetes mellitus with other diabetic kidney complication: Secondary | ICD-10-CM | POA: Diagnosis not present

## 2017-06-21 DIAGNOSIS — N186 End stage renal disease: Secondary | ICD-10-CM | POA: Diagnosis not present

## 2017-06-21 DIAGNOSIS — D509 Iron deficiency anemia, unspecified: Secondary | ICD-10-CM | POA: Diagnosis not present

## 2017-06-21 DIAGNOSIS — D631 Anemia in chronic kidney disease: Secondary | ICD-10-CM | POA: Diagnosis not present

## 2017-06-21 DIAGNOSIS — N2581 Secondary hyperparathyroidism of renal origin: Secondary | ICD-10-CM | POA: Diagnosis not present

## 2017-06-24 DIAGNOSIS — N186 End stage renal disease: Secondary | ICD-10-CM | POA: Diagnosis not present

## 2017-06-24 DIAGNOSIS — N2581 Secondary hyperparathyroidism of renal origin: Secondary | ICD-10-CM | POA: Diagnosis not present

## 2017-06-24 DIAGNOSIS — E1129 Type 2 diabetes mellitus with other diabetic kidney complication: Secondary | ICD-10-CM | POA: Diagnosis not present

## 2017-06-24 DIAGNOSIS — D631 Anemia in chronic kidney disease: Secondary | ICD-10-CM | POA: Diagnosis not present

## 2017-06-24 DIAGNOSIS — D509 Iron deficiency anemia, unspecified: Secondary | ICD-10-CM | POA: Diagnosis not present

## 2017-06-26 DIAGNOSIS — D631 Anemia in chronic kidney disease: Secondary | ICD-10-CM | POA: Diagnosis not present

## 2017-06-26 DIAGNOSIS — E1129 Type 2 diabetes mellitus with other diabetic kidney complication: Secondary | ICD-10-CM | POA: Diagnosis not present

## 2017-06-26 DIAGNOSIS — N2581 Secondary hyperparathyroidism of renal origin: Secondary | ICD-10-CM | POA: Diagnosis not present

## 2017-06-26 DIAGNOSIS — N186 End stage renal disease: Secondary | ICD-10-CM | POA: Diagnosis not present

## 2017-06-26 DIAGNOSIS — D509 Iron deficiency anemia, unspecified: Secondary | ICD-10-CM | POA: Diagnosis not present

## 2017-06-28 DIAGNOSIS — D631 Anemia in chronic kidney disease: Secondary | ICD-10-CM | POA: Diagnosis not present

## 2017-06-28 DIAGNOSIS — E1129 Type 2 diabetes mellitus with other diabetic kidney complication: Secondary | ICD-10-CM | POA: Diagnosis not present

## 2017-06-28 DIAGNOSIS — N2581 Secondary hyperparathyroidism of renal origin: Secondary | ICD-10-CM | POA: Diagnosis not present

## 2017-06-28 DIAGNOSIS — D509 Iron deficiency anemia, unspecified: Secondary | ICD-10-CM | POA: Diagnosis not present

## 2017-06-28 DIAGNOSIS — N186 End stage renal disease: Secondary | ICD-10-CM | POA: Diagnosis not present

## 2017-07-01 DIAGNOSIS — E1129 Type 2 diabetes mellitus with other diabetic kidney complication: Secondary | ICD-10-CM | POA: Diagnosis not present

## 2017-07-01 DIAGNOSIS — D509 Iron deficiency anemia, unspecified: Secondary | ICD-10-CM | POA: Diagnosis not present

## 2017-07-01 DIAGNOSIS — D631 Anemia in chronic kidney disease: Secondary | ICD-10-CM | POA: Diagnosis not present

## 2017-07-01 DIAGNOSIS — N2581 Secondary hyperparathyroidism of renal origin: Secondary | ICD-10-CM | POA: Diagnosis not present

## 2017-07-01 DIAGNOSIS — N186 End stage renal disease: Secondary | ICD-10-CM | POA: Diagnosis not present

## 2017-07-03 DIAGNOSIS — D509 Iron deficiency anemia, unspecified: Secondary | ICD-10-CM | POA: Diagnosis not present

## 2017-07-03 DIAGNOSIS — E1129 Type 2 diabetes mellitus with other diabetic kidney complication: Secondary | ICD-10-CM | POA: Diagnosis not present

## 2017-07-03 DIAGNOSIS — N186 End stage renal disease: Secondary | ICD-10-CM | POA: Diagnosis not present

## 2017-07-03 DIAGNOSIS — N2581 Secondary hyperparathyroidism of renal origin: Secondary | ICD-10-CM | POA: Diagnosis not present

## 2017-07-03 DIAGNOSIS — D631 Anemia in chronic kidney disease: Secondary | ICD-10-CM | POA: Diagnosis not present

## 2017-07-05 DIAGNOSIS — N186 End stage renal disease: Secondary | ICD-10-CM | POA: Diagnosis not present

## 2017-07-05 DIAGNOSIS — D631 Anemia in chronic kidney disease: Secondary | ICD-10-CM | POA: Diagnosis not present

## 2017-07-05 DIAGNOSIS — N2581 Secondary hyperparathyroidism of renal origin: Secondary | ICD-10-CM | POA: Diagnosis not present

## 2017-07-05 DIAGNOSIS — D509 Iron deficiency anemia, unspecified: Secondary | ICD-10-CM | POA: Diagnosis not present

## 2017-07-05 DIAGNOSIS — E1129 Type 2 diabetes mellitus with other diabetic kidney complication: Secondary | ICD-10-CM | POA: Diagnosis not present

## 2017-07-07 DIAGNOSIS — E1122 Type 2 diabetes mellitus with diabetic chronic kidney disease: Secondary | ICD-10-CM | POA: Diagnosis not present

## 2017-07-07 DIAGNOSIS — Z992 Dependence on renal dialysis: Secondary | ICD-10-CM | POA: Diagnosis not present

## 2017-07-07 DIAGNOSIS — N186 End stage renal disease: Secondary | ICD-10-CM | POA: Diagnosis not present

## 2017-07-08 DIAGNOSIS — Z23 Encounter for immunization: Secondary | ICD-10-CM | POA: Diagnosis not present

## 2017-07-08 DIAGNOSIS — N2581 Secondary hyperparathyroidism of renal origin: Secondary | ICD-10-CM | POA: Diagnosis not present

## 2017-07-08 DIAGNOSIS — E1129 Type 2 diabetes mellitus with other diabetic kidney complication: Secondary | ICD-10-CM | POA: Diagnosis not present

## 2017-07-08 DIAGNOSIS — D509 Iron deficiency anemia, unspecified: Secondary | ICD-10-CM | POA: Diagnosis not present

## 2017-07-08 DIAGNOSIS — N186 End stage renal disease: Secondary | ICD-10-CM | POA: Diagnosis not present

## 2017-07-10 DIAGNOSIS — D509 Iron deficiency anemia, unspecified: Secondary | ICD-10-CM | POA: Diagnosis not present

## 2017-07-10 DIAGNOSIS — Z23 Encounter for immunization: Secondary | ICD-10-CM | POA: Diagnosis not present

## 2017-07-10 DIAGNOSIS — N2581 Secondary hyperparathyroidism of renal origin: Secondary | ICD-10-CM | POA: Diagnosis not present

## 2017-07-10 DIAGNOSIS — N186 End stage renal disease: Secondary | ICD-10-CM | POA: Diagnosis not present

## 2017-07-10 DIAGNOSIS — E1129 Type 2 diabetes mellitus with other diabetic kidney complication: Secondary | ICD-10-CM | POA: Diagnosis not present

## 2017-07-12 DIAGNOSIS — Z23 Encounter for immunization: Secondary | ICD-10-CM | POA: Diagnosis not present

## 2017-07-12 DIAGNOSIS — N186 End stage renal disease: Secondary | ICD-10-CM | POA: Diagnosis not present

## 2017-07-12 DIAGNOSIS — N2581 Secondary hyperparathyroidism of renal origin: Secondary | ICD-10-CM | POA: Diagnosis not present

## 2017-07-12 DIAGNOSIS — E1129 Type 2 diabetes mellitus with other diabetic kidney complication: Secondary | ICD-10-CM | POA: Diagnosis not present

## 2017-07-12 DIAGNOSIS — D509 Iron deficiency anemia, unspecified: Secondary | ICD-10-CM | POA: Diagnosis not present

## 2017-07-15 DIAGNOSIS — E1129 Type 2 diabetes mellitus with other diabetic kidney complication: Secondary | ICD-10-CM | POA: Diagnosis not present

## 2017-07-15 DIAGNOSIS — N186 End stage renal disease: Secondary | ICD-10-CM | POA: Diagnosis not present

## 2017-07-15 DIAGNOSIS — N2581 Secondary hyperparathyroidism of renal origin: Secondary | ICD-10-CM | POA: Diagnosis not present

## 2017-07-15 DIAGNOSIS — D509 Iron deficiency anemia, unspecified: Secondary | ICD-10-CM | POA: Diagnosis not present

## 2017-07-15 DIAGNOSIS — Z23 Encounter for immunization: Secondary | ICD-10-CM | POA: Diagnosis not present

## 2017-07-17 DIAGNOSIS — N2581 Secondary hyperparathyroidism of renal origin: Secondary | ICD-10-CM | POA: Diagnosis not present

## 2017-07-17 DIAGNOSIS — E1129 Type 2 diabetes mellitus with other diabetic kidney complication: Secondary | ICD-10-CM | POA: Diagnosis not present

## 2017-07-17 DIAGNOSIS — D509 Iron deficiency anemia, unspecified: Secondary | ICD-10-CM | POA: Diagnosis not present

## 2017-07-17 DIAGNOSIS — Z23 Encounter for immunization: Secondary | ICD-10-CM | POA: Diagnosis not present

## 2017-07-17 DIAGNOSIS — N186 End stage renal disease: Secondary | ICD-10-CM | POA: Diagnosis not present

## 2017-07-19 DIAGNOSIS — D509 Iron deficiency anemia, unspecified: Secondary | ICD-10-CM | POA: Diagnosis not present

## 2017-07-19 DIAGNOSIS — Z23 Encounter for immunization: Secondary | ICD-10-CM | POA: Diagnosis not present

## 2017-07-19 DIAGNOSIS — E1129 Type 2 diabetes mellitus with other diabetic kidney complication: Secondary | ICD-10-CM | POA: Diagnosis not present

## 2017-07-19 DIAGNOSIS — N2581 Secondary hyperparathyroidism of renal origin: Secondary | ICD-10-CM | POA: Diagnosis not present

## 2017-07-19 DIAGNOSIS — N186 End stage renal disease: Secondary | ICD-10-CM | POA: Diagnosis not present

## 2017-07-22 DIAGNOSIS — N2581 Secondary hyperparathyroidism of renal origin: Secondary | ICD-10-CM | POA: Diagnosis not present

## 2017-07-22 DIAGNOSIS — N186 End stage renal disease: Secondary | ICD-10-CM | POA: Diagnosis not present

## 2017-07-22 DIAGNOSIS — Z23 Encounter for immunization: Secondary | ICD-10-CM | POA: Diagnosis not present

## 2017-07-22 DIAGNOSIS — E1129 Type 2 diabetes mellitus with other diabetic kidney complication: Secondary | ICD-10-CM | POA: Diagnosis not present

## 2017-07-22 DIAGNOSIS — D509 Iron deficiency anemia, unspecified: Secondary | ICD-10-CM | POA: Diagnosis not present

## 2017-07-24 ENCOUNTER — Encounter: Payer: Medicare Other | Admitting: Vascular Surgery

## 2017-07-24 ENCOUNTER — Other Ambulatory Visit: Payer: Self-pay | Admitting: *Deleted

## 2017-07-24 ENCOUNTER — Encounter: Payer: Self-pay | Admitting: Vascular Surgery

## 2017-07-24 ENCOUNTER — Ambulatory Visit (INDEPENDENT_AMBULATORY_CARE_PROVIDER_SITE_OTHER): Payer: Medicare Other | Admitting: Vascular Surgery

## 2017-07-24 ENCOUNTER — Encounter: Payer: Self-pay | Admitting: *Deleted

## 2017-07-24 VITALS — BP 114/69 | HR 81 | Temp 98.7°F | Resp 16 | Ht 72.0 in | Wt 199.0 lb

## 2017-07-24 DIAGNOSIS — N2581 Secondary hyperparathyroidism of renal origin: Secondary | ICD-10-CM | POA: Diagnosis not present

## 2017-07-24 DIAGNOSIS — Z992 Dependence on renal dialysis: Secondary | ICD-10-CM

## 2017-07-24 DIAGNOSIS — Z9861 Coronary angioplasty status: Secondary | ICD-10-CM

## 2017-07-24 DIAGNOSIS — I2510 Atherosclerotic heart disease of native coronary artery without angina pectoris: Secondary | ICD-10-CM | POA: Diagnosis not present

## 2017-07-24 DIAGNOSIS — Z23 Encounter for immunization: Secondary | ICD-10-CM | POA: Diagnosis not present

## 2017-07-24 DIAGNOSIS — N186 End stage renal disease: Secondary | ICD-10-CM | POA: Diagnosis not present

## 2017-07-24 DIAGNOSIS — D509 Iron deficiency anemia, unspecified: Secondary | ICD-10-CM | POA: Diagnosis not present

## 2017-07-24 DIAGNOSIS — E1129 Type 2 diabetes mellitus with other diabetic kidney complication: Secondary | ICD-10-CM | POA: Diagnosis not present

## 2017-07-24 NOTE — Progress Notes (Signed)
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 The consult is requested by Dr. Jimmy Footman.   HPI:   [name] [name] is a pleasant 60 y.o. male,  Who had a left brachiocephalic fistula placed proximally 10 years ago in [place]. This is extremely aneurysmal and was sent for evaluation for plication. He states the fistula has been working well. He denies any recent uremic symptoms. Specifically, he denies nausea, vomiting, fatigue, anorexia, or palpitations.  I reviewed the records that were sent from the referring office. Creatinine in April of this year was 11.6.  Past Medical History:  Diagnosis Date  . Anemia   . Anginal pain ([place])   . CAD (coronary artery disease)    a. per [facility] s/p 3.74mm x 33mm Vision BMS to mid LAD 12/2009 and Xience DES to mid LAD 10/2010.  [name] Chronic diastolic CHF (congestive heart failure) ([place])   . Colon polyps   . Daily headache   . ESRD on dialysis [facility]) since ~ 2008   "MWF; Jeneen Rinks" (03/04/2017)  . GERD (gastroesophageal reflux disease)   . Heart murmur   . Hematochezia    a. 2014: colonscopy, which showed moderately-sized internal hemorrhoids, two 69mm polyps in transverse colon and ascending colon that were resected, five 2-67mm polyps in sigmoid colon, descending colon, transverse colon, and ascending colon that were resected. An upper endoscopy was performed and showed normal esophagus, stomach, and duodenum.  . Hematuria    a. H/o hematuria 2014 with cystoscopy that was unrevealing for his source of hematuria. He underwent a kidney ultrasound on 10/14 that showed mildly echogenic and scarred kidneys compatible with medical renal disease, without hydronephrosis or renal calculi.  [name] History of blood transfusion    "had colonoscopy done; they had to give me some blood"  . Hyperlipidemia   . Hypertension   . On home oxygen therapy    "2L prn" (07/21/2015); "been  off it for awhile" (03/04/2017)  . Renal insufficiency   . 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 Type II diabetes mellitus (HCC)     Family History  Problem Relation Age of Onset  . Bone cancer Mother   . Anuerysm Father   . Hypertension Unknown   . Diabetes type II Daughter     SOCIAL HISTORY: Social History   Social History  . Marital status: Married    Spouse name: N/A  . Number of children: N/A  . Years of education: N/A   Occupational History  . Not on file.   Social History Main Topics  . Smoking status: Former Smoker    Packs/day: 0.50    Years: 8.00    Types: Cigarettes    Quit date: 01/07/2011  . Smokeless tobacco: Never Used  . Alcohol use No  . Drug use: No  . Sexual activity: Not Currently   Other Topics Concern  . Not on file   Social History Narrative   Moved from [place], [place] to [place] in 11/2014.      Lives with fiancee      Quit smoking 02/2017    Allergies  Allergen Reactions  . Enalapril Hives and Rash  . Latex Rash  . Tape Rash    TAPE MAKES SKIN BREAK OUT AND TURN RED    Current Outpatient Prescriptions  Medication Sig Dispense Refill  . albuterol (PROVENTIL  HFA;VENTOLIN HFA) 108 (90 Base) MCG/ACT inhaler Inhale 1-2 puffs into the lungs every 6 (six) hours as needed for wheezing or shortness of breath. 1 Inhaler 0  . aspirin 81 MG chewable tablet Chew 1 tablet (81 mg total) by mouth daily.    [name] atorvastatin (LIPITOR) 20 MG tablet Take 20 mg by mouth at bedtime. Reported on 03/14/2016    . cinacalcet (SENSIPAR) 90 MG tablet Take 180 mg by mouth every Monday, Wednesday, and Friday with hemodialysis.     [name] multivitamin (RENA-VIT) TABS tablet Take 1 tablet by mouth at bedtime. 30 tablet 0  . nitroGLYCERIN (NITROSTAT) 0.4 MG SL tablet Place 0.4 mg under the tongue every 5 (five) minutes as needed for chest pain.    . sevelamer carbonate (RENVELA) 800 MG tablet Take 4 tablets (3,200 mg total) by mouth 3  (three) times daily with meals. 320 tablet 0  . amLODipine (NORVASC) 10 MG tablet Take 10 mg by mouth at bedtime.  3  . carvedilol (COREG) 25 MG tablet Take 1 tablet (25 mg total) by mouth 2 (two) times daily with a meal. (Patient not taking: Reported on 07/24/2017) 60 tablet 2  . doxylamine, Sleep, (UNISOM) 25 MG tablet Take 0.5-1 tablets (12.5-25 mg total) by mouth at bedtime. Take NO MORE THAN 25 mg for sleep every 24 hours. (Patient not taking: Reported on 07/24/2017) 30 tablet 0  . Nutritional Supplements (FEEDING SUPPLEMENT, NEPRO CARB STEADY,) LIQD Take 237 mLs by mouth 2 (two) times daily between meals. (Patient not taking: Reported on 07/24/2017) 30 Can 2  . pantoprazole (PROTONIX) 40 MG tablet Take 1 tablet (40 mg total) by mouth daily. (Patient not taking: Reported on 07/24/2017) 30 tablet 2   No current facility-administered medications for this visit.     REVIEW OF SYSTEMS:  [X]  denotes positive finding, [ ]  denotes negative finding Cardiac  Comments:  Chest pain or chest pressure: X   Shortness of breath upon exertion: X   Short of breath when lying flat:    Irregular heart rhythm:        Vascular    Pain in calf, thigh, or hip brought on by ambulation:    Pain in feet at night that wakes you up from your sleep:     Blood clot in your veins:    Leg swelling:         Pulmonary    Oxygen at home:    Productive cough:     Wheezing:         Neurologic    Sudden weakness in arms or legs:     Sudden numbness in arms or legs:     Sudden onset of difficulty speaking or slurred speech:    Temporary loss of vision in one eye:     Problems with dizziness:         Gastrointestinal    Blood in stool:     Vomited blood:         Genitourinary    Burning when urinating:     Blood in urine:        Psychiatric    Major depression:         Hematologic    Bleeding problems:    Problems with blood clotting too easily:        Skin    Rashes or ulcers:          Constitutional    Fever or chills:     PHYSICAL EXAM:  Vitals:   07/24/17 1148  BP: 114/69  Pulse: 81  Resp: 16  Temp: 98.7 F (37.1 C)  SpO2: 96%  Weight: 199 lb (90.3 kg)  Height: 6' (1.829 m)    GENERAL: The patient is a well-nourished male, in no acute distress. The vital signs are documented above. CARDIAC: There is a regular rate and rhythm.  VASCULAR: he has a good thrill in his left upper arm fistula. The fistula below the aneurysms by palpation is very calcified. PULMONARY: There is good air exchange bilaterally without wheezing or rales. MUSCULOSKELETAL: There are no major deformities or cyanosis. NEUROLOGIC: No focal weakness or paresthesias are detected. SKIN: there is a very superficial ulceration over the smaller aneurysmin the left upper arm fistula. The skin over this smaller aneurysm appears to be more thinned out that over the larger aneurysm. PSYCHIATRIC: The patient has a normal affect.  DATA:    ECHO: He had an echo on 03/12/2016 which showed ejection fraction of 45-50%. Systolic function was mildly reduced.  MEDICAL ISSUES:   ANEURYSMAL LEFT UPPER ARM AV FISTULA: this patient has had this fistula for many years and it is quite degenerative. He has 2 large aneurysms. The smaller aneurysm has a small eschar over this and the skin is somewhat thinned out over the aneurysm. I have recommended that we fix the smaller aneurysm first. Hopefully we can plicate this so that ultimately this area could be cannulated again. I do not think that would be possible to address both aneurysms and still have access for 2 needles. The fistula below the larger aneurysm is very calcified and it may be difficult to cannulate this area. In addition that calcium may make revision of this area more challenging. I've explained the option of either addressing both aneurysms in placing a catheter until the fistula can be used again versus doing aneurysms in a staged fashion. He would  like to do this in a staged fashion. He dialyzes on Monday Wednesdays and Fridays we will schedule this on a Tuesday, 07/30/2017.  Deitra Mayo [facility] [place] [phone]

## 2017-07-26 ENCOUNTER — Encounter (HOSPITAL_COMMUNITY): Payer: Self-pay | Admitting: *Deleted

## 2017-07-26 DIAGNOSIS — D509 Iron deficiency anemia, unspecified: Secondary | ICD-10-CM | POA: Diagnosis not present

## 2017-07-26 DIAGNOSIS — E1129 Type 2 diabetes mellitus with other diabetic kidney complication: Secondary | ICD-10-CM | POA: Diagnosis not present

## 2017-07-26 DIAGNOSIS — N186 End stage renal disease: Secondary | ICD-10-CM | POA: Diagnosis not present

## 2017-07-26 DIAGNOSIS — Z23 Encounter for immunization: Secondary | ICD-10-CM | POA: Diagnosis not present

## 2017-07-26 DIAGNOSIS — N2581 Secondary hyperparathyroidism of renal origin: Secondary | ICD-10-CM | POA: Diagnosis not present

## 2017-07-29 ENCOUNTER — Encounter (HOSPITAL_COMMUNITY): Payer: Self-pay | Admitting: *Deleted

## 2017-07-29 DIAGNOSIS — E1129 Type 2 diabetes mellitus with other diabetic kidney complication: Secondary | ICD-10-CM | POA: Diagnosis not present

## 2017-07-29 DIAGNOSIS — D509 Iron deficiency anemia, unspecified: Secondary | ICD-10-CM | POA: Diagnosis not present

## 2017-07-29 DIAGNOSIS — Z23 Encounter for immunization: Secondary | ICD-10-CM | POA: Diagnosis not present

## 2017-07-29 DIAGNOSIS — N186 End stage renal disease: Secondary | ICD-10-CM | POA: Diagnosis not present

## 2017-07-29 DIAGNOSIS — N2581 Secondary hyperparathyroidism of renal origin: Secondary | ICD-10-CM | POA: Diagnosis not present

## 2017-07-29 NOTE — Anesthesia Preprocedure Evaluation (Addendum)
Anesthesia Evaluation  Patient identified by MRN, date of birth, ID band Patient awake    Reviewed: Allergy & Precautions, NPO status , Patient's Chart, lab work & pertinent test results  History of Anesthesia Complications Negative for: history of anesthetic complications  Airway Mallampati: I  TM Distance: >3 FB Neck ROM: Full    Dental no notable dental hx. (+) Edentulous Lower, Edentulous Upper, Dental Advisory Given   Pulmonary neg pulmonary ROS, Current Smoker,    Pulmonary exam normal        Cardiovascular hypertension, + angina + CAD and + Cardiac Stents  Normal cardiovascular exam + Systolic murmurs Study Conclusions  - Left ventricle: The cavity size was normal. There was moderate   concentric hypertrophy. Systolic function was mildly reduced. The   estimated ejection fraction was in the range of 45% to 50%. Wall   motion was normal; there were no regional wall motion   abnormalities. - Mitral valve: Calcified annulus. Moderately thickened, severely   calcified leaflets . The findings are consistent with mild   stenosis. There was moderate regurgitation. - Left atrium: The atrium was severely dilated. - Right ventricle: The cavity size was moderately dilated. Wall   thickness was normal. Systolic function was moderately reduced. - Right atrium: The atrium was mildly dilated. - Tricuspid valve: There was moderate-severe regurgitation.   Neuro/Psych PSYCHIATRIC DISORDERS Depression TIA   GI/Hepatic Neg liver ROS, GERD  ,  Endo/Other  diabetes  Renal/GU Renal disease     Musculoskeletal negative musculoskeletal ROS (+)   Abdominal   Peds  Hematology negative hematology ROS (+)   Anesthesia Other Findings Day of surgery medications reviewed with the patient.  Reproductive/Obstetrics                            Anesthesia Physical Anesthesia Plan  ASA: III  Anesthesia Plan:  General   Post-op Pain Management:    Induction: Intravenous  PONV Risk Score and Plan: 2 and Ondansetron and Dexamethasone  Airway Management Planned: LMA  Additional Equipment:   Intra-op Plan:   Post-operative Plan: Extubation in OR  Informed Consent: I have reviewed the patients History and Physical, chart, labs and discussed the procedure including the risks, benefits and alternatives for the proposed anesthesia with the patient or authorized representative who has indicated his/her understanding and acceptance.   Dental advisory given  Plan Discussed with: CRNA, Anesthesiologist and Surgeon  Anesthesia Plan Comments:        Anesthesia Quick Evaluation

## 2017-07-29 NOTE — Progress Notes (Signed)
Anesthesia Chart Review:  Pt is a same day work up.   Pt is a 61 year old male scheduled for plication of L AV fistula on 07/30/2017 with Deitra Mayo, MD  - Receives primary care at the [facility]. Last office visit 06/13/17.   - Pt out of date with cardiology follow up.  Per PCP note 06/13/17, "Pt is supposed to follow up with cardiology- he knows about the appt at [facility]" however pt told pre-admission testing RN he is not under care of cardiologist. Last cardiology visit 05/13/16 at [facility] with Abhijit Kshirsagar, MD (notes in care everywhere)   PMH includes:  CAD (BMS to LAD 2011; DES to LAD 2012), chronic diastolic CHF, HTN, DM, anemia, ESRD on hemodialysis, hyperlipidemia, GERD. Current smoker. BMI 27  - Hospitalized 8/31-06/08/17 for atypical chest pain. Troponin initially 0.05, then negative x2. EKG stable. Instructed to f/u with cardiology but pt has not done so yet.   Medications include: albuterol, amlodipine, ASA 81mg , lipitor  Labs will be obtained day of surgery.   CXR 06/06/17: Improving interstitial edema.  EKG 06/08/17: NSR. Incomplete LBBB. LVH  Echo 07/28/16 (care everywhere):  - Normal LV systolic function. Mild LVH. EF is visually estimated at 55-60%.  - Moderate-to-severe mitral regurgitation - Moderately dilated left atrium. - Mild tricuspid regurgitation - No significant change vs previous echo except pulmonary pressures appear less on this study  Carotid duplex 06/30/16 (care everywhere):  1. No hemodynamically significant stenosis estimated in the right ICA.  2. No hemodynamically significant stenosis estimated in the left ICA. 3. Antegrade flow in the vertebral arteries.  Echo 03/12/16:  - Left ventricle: The cavity size was normal. There was moderate concentric hypertrophy. Systolic function was mildly reduced. The estimated ejection fraction was in the range of 45% to 50%. Wall motion was normal; there were no regional wall motion  abnormalities. - Mitral valve: Calcified annulus. Moderately thickened, severely calcified leaflets . The findings are consistent with mild stenosis. There was moderate regurgitation. - Left atrium: The atrium was severely dilated. - Right ventricle: The cavity size was moderately dilated. Wall thickness was normal. Systolic function was moderately reduced. - Right atrium: The atrium was mildly dilated. - Tricuspid valve: There was moderate-severe regurgitation. - Pulmonary arteries: Systolic pressure was within the normal range. - Inferior vena cava: The vessel was normal in size. - Pericardium, extracardiac: There was no pericardial effusion. - Impressions: There is no significant difference from the prior study on 05/18/2015.  Nuclear stress test 11/07/15 (care everywhere):  1. No reversible ischemia or infarction 2. Normal left ventricular wall motion 3. Left ventricular ejection fraction at 54 4. Low-risk stress test findings  Cardiac cath 11/23/14:  - LM: Short angiographically normal  - LAD: Moderate size vessel. Widely patent proximal to mid LAD stent.  Minimal narrowing of D1 at the ostium, which came off in the proximal portion of the stented segment, which was not felt to be significant.  Very minimal 10 - 20% narrowing in the mid distal portion of the stent in the region of a small D2 takeoff.  Remainder of LAD free of significant disease. - CX: Large caliber angiographic normal vessel. Gave rise to small OM1 and moderate size OM2. - RCA: Large dominant normal RCA. Large PDA and PLA system without obstructive disease - IMPRESSION: Normal LV function. No significant obstructive CAD with widely patent LAD stent in the proximal to midsegment with minimal 20% smooth in-stent narrowing and mild 20% ostial narrowing of  D1 within the stented segment.  If labs acceptable DOS, I anticipate pt can proceed as scheduled.   Willeen Cass, FNP-BC [facility] Short Stay Surgical  Center/Anesthesiology Phone: [phone] 07/29/2017 3:03 PM

## 2017-07-29 NOTE — Progress Notes (Signed)
Anesthesia asked to review pt history. 

## 2017-07-29 NOTE — Progress Notes (Signed)
Pt denies any acute cardiopulmonary issues. Pt denies being under the care of a cardiologist. Pt stated that he does not check his blood glucose. Pt verbalized understanding of all pre-op instructions.

## 2017-07-30 ENCOUNTER — Ambulatory Visit (HOSPITAL_COMMUNITY): Payer: Medicare Other | Admitting: Emergency Medicine

## 2017-07-30 ENCOUNTER — Ambulatory Visit (HOSPITAL_COMMUNITY)
Admission: RE | Admit: 2017-07-30 | Discharge: 2017-07-30 | Disposition: A | Discharge status: Home / Self Care | Payer: Medicare Other | Source: Ambulatory Visit | Attending: Vascular Surgery | Admitting: Vascular Surgery

## 2017-07-30 ENCOUNTER — Encounter (HOSPITAL_COMMUNITY)
Admission: RE | Disposition: A | Discharge status: Home / Self Care | Payer: Self-pay | Source: Ambulatory Visit | Attending: Vascular Surgery

## 2017-07-30 ENCOUNTER — Encounter (HOSPITAL_COMMUNITY): Payer: Self-pay

## 2017-07-30 DIAGNOSIS — T829XXA Unspecified complication of cardiac and vascular prosthetic device, implant and graft, initial encounter: Secondary | ICD-10-CM | POA: Diagnosis not present

## 2017-07-30 DIAGNOSIS — Z86010 Personal history of colonic polyps: Secondary | ICD-10-CM | POA: Diagnosis not present

## 2017-07-30 DIAGNOSIS — Z79899 Other long term (current) drug therapy: Secondary | ICD-10-CM | POA: Diagnosis not present

## 2017-07-30 DIAGNOSIS — I340 Nonrheumatic mitral (valve) insufficiency: Secondary | ICD-10-CM | POA: Diagnosis not present

## 2017-07-30 DIAGNOSIS — Z955 Presence of coronary angioplasty implant and graft: Secondary | ICD-10-CM | POA: Insufficient documentation

## 2017-07-30 DIAGNOSIS — E785 Hyperlipidemia, unspecified: Secondary | ICD-10-CM | POA: Insufficient documentation

## 2017-07-30 DIAGNOSIS — I120 Hypertensive chronic kidney disease with stage 5 chronic kidney disease or end stage renal disease: Secondary | ICD-10-CM | POA: Diagnosis not present

## 2017-07-30 DIAGNOSIS — T82898A Other specified complication of vascular prosthetic devices, implants and grafts, initial encounter: Secondary | ICD-10-CM | POA: Insufficient documentation

## 2017-07-30 DIAGNOSIS — I509 Heart failure, unspecified: Secondary | ICD-10-CM | POA: Diagnosis not present

## 2017-07-30 DIAGNOSIS — I132 Hypertensive heart and chronic kidney disease with heart failure and with stage 5 chronic kidney disease, or end stage renal disease: Secondary | ICD-10-CM | POA: Insufficient documentation

## 2017-07-30 DIAGNOSIS — K219 Gastro-esophageal reflux disease without esophagitis: Secondary | ICD-10-CM | POA: Insufficient documentation

## 2017-07-30 DIAGNOSIS — Z992 Dependence on renal dialysis: Secondary | ICD-10-CM | POA: Insufficient documentation

## 2017-07-30 DIAGNOSIS — E1122 Type 2 diabetes mellitus with diabetic chronic kidney disease: Secondary | ICD-10-CM | POA: Insufficient documentation

## 2017-07-30 DIAGNOSIS — K648 Other hemorrhoids: Secondary | ICD-10-CM | POA: Diagnosis not present

## 2017-07-30 DIAGNOSIS — I2510 Atherosclerotic heart disease of native coronary artery without angina pectoris: Secondary | ICD-10-CM | POA: Diagnosis not present

## 2017-07-30 DIAGNOSIS — N186 End stage renal disease: Secondary | ICD-10-CM | POA: Insufficient documentation

## 2017-07-30 DIAGNOSIS — F17200 Nicotine dependence, unspecified, uncomplicated: Secondary | ICD-10-CM | POA: Insufficient documentation

## 2017-07-30 DIAGNOSIS — Y832 Surgical operation with anastomosis, bypass or graft as the cause of abnormal reaction of the patient, or of later complication, without mention of misadventure at the time of the procedure: Secondary | ICD-10-CM | POA: Diagnosis not present

## 2017-07-30 DIAGNOSIS — I5032 Chronic diastolic (congestive) heart failure: Secondary | ICD-10-CM | POA: Diagnosis not present

## 2017-07-30 DIAGNOSIS — Z7982 Long term (current) use of aspirin: Secondary | ICD-10-CM | POA: Insufficient documentation

## 2017-07-30 HISTORY — PX: REVISON OF ARTERIOVENOUS FISTULA: SHX6074

## 2017-07-30 HISTORY — DX: Presence of dental prosthetic device (complete) (partial): Z97.2

## 2017-07-30 LAB — CBC
HCT: 40.2 % (ref 39.0–52.0)
Hemoglobin: 12.8 g/dL — ABNORMAL LOW (ref 13.0–17.0)
MCH: 27.9 pg (ref 26.0–34.0)
MCHC: 31.8 g/dL (ref 30.0–36.0)
MCV: 87.6 fL (ref 78.0–100.0)
PLATELETS: 156 10*3/uL (ref 150–400)
RBC: 4.59 MIL/uL (ref 4.22–5.81)
RDW: 17.4 % — AB (ref 11.5–15.5)
WBC: 4.5 10*3/uL (ref 4.0–10.5)

## 2017-07-30 LAB — BASIC METABOLIC PANEL
Anion gap: 15 (ref 5–15)
BUN: 34 mg/dL — AB (ref 6–20)
CO2: 28 mmol/L (ref 22–32)
Calcium: 8.6 mg/dL — ABNORMAL LOW (ref 8.9–10.3)
Chloride: 95 mmol/L — ABNORMAL LOW (ref 101–111)
Creatinine, Ser: 10.55 mg/dL — ABNORMAL HIGH (ref 0.61–1.24)
GFR calc Af Amer: 5 mL/min — ABNORMAL LOW (ref >60)
GFR, EST NON AFRICAN AMERICAN: 5 mL/min — AB (ref >60)
GLUCOSE: 78 mg/dL (ref 65–99)
Potassium: 5.1 mmol/L (ref 3.5–5.1)
Sodium: 138 mmol/L (ref 135–145)

## 2017-07-30 LAB — GLUCOSE, CAPILLARY: Glucose-Capillary: 84 mg/dL (ref 65–99)

## 2017-07-30 SURGERY — REVISON OF ARTERIOVENOUS FISTULA
Anesthesia: General | Site: Arm Upper | Laterality: Left

## 2017-07-30 MED ORDER — PROPOFOL 10 MG/ML IV BOLUS
INTRAVENOUS | Status: AC
  Filled 2017-07-30: qty 20

## 2017-07-30 MED ORDER — CHLORHEXIDINE GLUCONATE 4 % EX LIQD: 60.0000 mL | Freq: Once | CUTANEOUS | Status: DC

## 2017-07-30 MED ORDER — EPHEDRINE SULFATE 50 MG/ML IJ SOLN
INTRAMUSCULAR | Status: DC | PRN
  Administered 2017-07-30 (×3): 10 mg via INTRAVENOUS

## 2017-07-30 MED ORDER — ONDANSETRON HCL 4 MG/2ML IJ SOLN
INTRAMUSCULAR | Status: DC | PRN
Start: 2017-07-30 — End: 2017-07-30
  Administered 2017-07-30: 4 mg via INTRAVENOUS

## 2017-07-30 MED ORDER — PHENYLEPHRINE HCL 10 MG/ML IJ SOLN
INTRAMUSCULAR | Status: DC | PRN
  Administered 2017-07-30 (×7): 80 ug via INTRAVENOUS

## 2017-07-30 MED ORDER — SUCCINYLCHOLINE CHLORIDE 20 MG/ML IJ SOLN
INTRAMUSCULAR | Status: DC | PRN
  Administered 2017-07-30: 100 mg via INTRAVENOUS

## 2017-07-30 MED ORDER — LIDOCAINE HCL 1 % IJ SOLN
INTRAMUSCULAR | Status: AC
  Filled 2017-07-30: qty 20

## 2017-07-30 MED ORDER — SODIUM CHLORIDE 0.9 % IV SOLN
INTRAVENOUS | Status: DC
  Administered 2017-07-30 (×2): via INTRAVENOUS

## 2017-07-30 MED ORDER — SODIUM CHLORIDE 0.9 % IR SOLN
Status: DC | PRN
  Administered 2017-07-30: 1000 mL

## 2017-07-30 MED ORDER — SODIUM CHLORIDE 0.9 % IV SOLN
INTRAVENOUS | Status: DC | PRN
  Administered 2017-07-30: 500 mL

## 2017-07-30 MED ORDER — MIDAZOLAM HCL 5 MG/5ML IJ SOLN
INTRAMUSCULAR | Status: DC | PRN
  Administered 2017-07-30: 2 mg via INTRAVENOUS

## 2017-07-30 MED ORDER — LIDOCAINE-EPINEPHRINE (PF) 1 %-1:200000 IJ SOLN
INTRAMUSCULAR | Status: AC
  Filled 2017-07-30: qty 30

## 2017-07-30 MED ORDER — FENTANYL CITRATE (PF) 100 MCG/2ML IJ SOLN
INTRAMUSCULAR | Status: DC | PRN
  Administered 2017-07-30: 100 ug via INTRAVENOUS

## 2017-07-30 MED ORDER — PROTAMINE SULFATE 10 MG/ML IV SOLN
INTRAVENOUS | Status: DC | PRN
  Administered 2017-07-30: 40 mg via INTRAVENOUS

## 2017-07-30 MED ORDER — DEXTROSE 5 % IV SOLN
1.5000 g | INTRAVENOUS | Status: AC
  Administered 2017-07-30: 1.5 g via INTRAVENOUS
  Filled 2017-07-30: qty 1.5

## 2017-07-30 MED ORDER — MIDAZOLAM HCL 2 MG/2ML IJ SOLN
INTRAMUSCULAR | Status: AC
  Filled 2017-07-30: qty 2

## 2017-07-30 MED ORDER — LIDOCAINE HCL (CARDIAC) 20 MG/ML IV SOLN
INTRAVENOUS | Status: DC | PRN
  Administered 2017-07-30: 50 mg via INTRAVENOUS
  Administered 2017-07-30: 80 mg via INTRAVENOUS
  Administered 2017-07-30: 150 mg via INTRAVENOUS

## 2017-07-30 MED ORDER — OXYCODONE-ACETAMINOPHEN 5-325 MG PO TABS: 1.0000 | ORAL_TABLET | ORAL | 0 refills | Status: DC | PRN

## 2017-07-30 MED ORDER — FENTANYL CITRATE (PF) 250 MCG/5ML IJ SOLN
INTRAMUSCULAR | Status: AC
  Filled 2017-07-30: qty 5

## 2017-07-30 MED ORDER — LIDOCAINE-EPINEPHRINE 1 %-1:100000 IJ SOLN
INTRAMUSCULAR | Status: AC
  Filled 2017-07-30: qty 1

## 2017-07-30 MED ORDER — LIDOCAINE-EPINEPHRINE (PF) 1 %-1:200000 IJ SOLN
INTRAMUSCULAR | Status: DC | PRN
  Administered 2017-07-30: 17 mL

## 2017-07-30 MED ORDER — HEPARIN SODIUM (PORCINE) 1000 UNIT/ML IJ SOLN
INTRAMUSCULAR | Status: DC | PRN
  Administered 2017-07-30: 8000 [IU] via INTRAVENOUS

## 2017-07-30 SURGICAL SUPPLY — 43 items
ARMBAND PINK RESTRICT EXTREMIT (MISCELLANEOUS) ×3 IMPLANT
BNDG ESMARK 4X9 LF (GAUZE/BANDAGES/DRESSINGS) ×3 IMPLANT
CANISTER SUCT 3000ML PPV (MISCELLANEOUS) ×3 IMPLANT
CANNULA VESSEL 3MM 2 BLNT TIP (CANNULA) ×3 IMPLANT
CLIP VESOCCLUDE MED 6/CT (CLIP) ×3 IMPLANT
CLIP VESOCCLUDE SM WIDE 6/CT (CLIP) ×3 IMPLANT
COVER PROBE W GEL 5X96 (DRAPES) IMPLANT
CUFF TOURNIQUET SINGLE 18IN (TOURNIQUET CUFF) ×3 IMPLANT
DERMABOND ADVANCED (GAUZE/BANDAGES/DRESSINGS) ×2
DERMABOND ADVANCED .7 DNX12 (GAUZE/BANDAGES/DRESSINGS) ×1 IMPLANT
ELECT REM PT RETURN 9FT ADLT (ELECTROSURGICAL) ×3
ELECTRODE REM PT RTRN 9FT ADLT (ELECTROSURGICAL) ×1 IMPLANT
GLOVE BIO SURGEON STRL SZ7.5 (GLOVE) IMPLANT
GLOVE BIOGEL PI IND STRL 6.5 (GLOVE) ×1 IMPLANT
GLOVE BIOGEL PI IND STRL 7.5 (GLOVE) ×1 IMPLANT
GLOVE BIOGEL PI IND STRL 8 (GLOVE) ×1 IMPLANT
GLOVE BIOGEL PI IND STRL 8.5 (GLOVE) ×1 IMPLANT
GLOVE BIOGEL PI INDICATOR 6.5 (GLOVE) ×2
GLOVE BIOGEL PI INDICATOR 7.5 (GLOVE) ×2
GLOVE BIOGEL PI INDICATOR 8 (GLOVE) ×2
GLOVE BIOGEL PI INDICATOR 8.5 (GLOVE) ×2
GLOVE SKINSENSE NS SZ7.0 (GLOVE) ×6
GLOVE SKINSENSE NS SZ7.5 (GLOVE) ×2
GLOVE SKINSENSE STRL SZ7.0 (GLOVE) ×3 IMPLANT
GLOVE SKINSENSE STRL SZ7.5 (GLOVE) ×1 IMPLANT
GLOVE SURG SS PI 6.5 STRL IVOR (GLOVE) ×3 IMPLANT
GOWN STRL REUS W/ TWL LRG LVL3 (GOWN DISPOSABLE) ×2 IMPLANT
GOWN STRL REUS W/ TWL XL LVL3 (GOWN DISPOSABLE) ×3 IMPLANT
GOWN STRL REUS W/TWL LRG LVL3 (GOWN DISPOSABLE) ×4
GOWN STRL REUS W/TWL XL LVL3 (GOWN DISPOSABLE) ×6
KIT BASIN OR (CUSTOM PROCEDURE TRAY) ×3 IMPLANT
KIT ROOM TURNOVER OR (KITS) ×3 IMPLANT
NS IRRIG 1000ML POUR BTL (IV SOLUTION) ×3 IMPLANT
PACK CV ACCESS (CUSTOM PROCEDURE TRAY) ×3 IMPLANT
PAD ARMBOARD 7.5X6 YLW CONV (MISCELLANEOUS) ×6 IMPLANT
SPONGE SURGIFOAM ABS GEL 100 (HEMOSTASIS) IMPLANT
SUT PROLENE 5 0 C 1 24 (SUTURE) ×6 IMPLANT
SUT PROLENE 6 0 BV (SUTURE) ×3 IMPLANT
SUT VIC AB 3-0 SH 27 (SUTURE) ×4
SUT VIC AB 3-0 SH 27X BRD (SUTURE) ×2 IMPLANT
SUT VICRYL 4-0 PS2 18IN ABS (SUTURE) ×3 IMPLANT
UNDERPAD 30X30 (UNDERPADS AND DIAPERS) ×3 IMPLANT
WATER STERILE IRR 1000ML POUR (IV SOLUTION) ×3 IMPLANT

## 2017-07-30 NOTE — Anesthesia Procedure Notes (Signed)
Procedure Name: Intubation Date/Time: 07/30/2017 7:44 AM Performed by: Ollen Bowl Pre-anesthesia Checklist: Patient identified, Emergency Drugs available, Suction available and Patient being monitored Patient Re-evaluated:Patient Re-evaluated prior to induction Oxygen Delivery Method: Circle system utilized Preoxygenation: Pre-oxygenation with 100% oxygen Induction Type: IV induction Ventilation: Mask ventilation without difficulty Laryngoscope Size: Miller and 3 Grade View: Grade II Tube type: Oral Tube size: 7.5 mm Number of attempts: 1 Airway Equipment and Method: Patient positioned with wedge pillow and Stylet Placement Confirmation: ETT inserted through vocal cords under direct vision,  positive ETCO2 and breath sounds checked- equal and bilateral Secured at: 22 cm Tube secured with: Tape Dental Injury: Teeth and Oropharynx as per pre-operative assessment  Comments: Unable to seat LMA 5 so transitioned to OETT without difficulty. VSS throughout

## 2017-07-30 NOTE — H&P (View-Only) (Signed)
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 The consult is requested by Dr. Jimmy Footman.   HPI:   [name] [name] is a pleasant 61 y.o. male,  Who had a left brachiocephalic fistula placed proximally 10 years ago in [place]. This is extremely aneurysmal and was sent for evaluation for plication. He states the fistula has been working well. He denies any recent uremic symptoms. Specifically, he denies nausea, vomiting, fatigue, anorexia, or palpitations.  I reviewed the records that were sent from the referring office. Creatinine in April of this year was 11.6.  Past Medical History:  Diagnosis Date  . Anemia   . Anginal pain ([place])   . CAD (coronary artery disease)    a. per [facility] s/p 3.55mm x 25mm Vision BMS to mid LAD 12/2009 and Xience DES to mid LAD 10/2010.  [name] Chronic diastolic CHF (congestive heart failure) ([place])   . Colon polyps   . Daily headache   . ESRD on dialysis [facility]) since ~ 2008   "MWF; Jeneen Rinks" (03/04/2017)  . GERD (gastroesophageal reflux disease)   . Heart murmur   . Hematochezia    a. 2014: colonscopy, which showed moderately-sized internal hemorrhoids, two 68mm polyps in transverse colon and ascending colon that were resected, five 2-62mm polyps in sigmoid colon, descending colon, transverse colon, and ascending colon that were resected. An upper endoscopy was performed and showed normal esophagus, stomach, and duodenum.  . Hematuria    a. H/o hematuria 2014 with cystoscopy that was unrevealing for his source of hematuria. He underwent a kidney ultrasound on 10/14 that showed mildly echogenic and scarred kidneys compatible with medical renal disease, without hydronephrosis or renal calculi.  [name] History of blood transfusion    "had colonoscopy done; they had to give me some blood"  . Hyperlipidemia   . Hypertension   . On home oxygen therapy    "2L prn" (07/21/2015); "been  off it for awhile" (03/04/2017)  . Renal insufficiency   . 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 Type II diabetes mellitus (HCC)     Family History  Problem Relation Age of Onset  . Bone cancer Mother   . Anuerysm Father   . Hypertension Unknown   . Diabetes type II Daughter     SOCIAL HISTORY: Social History   Social History  . Marital status: Married    Spouse name: [name] [name]  . Number of children: [name] [name]  . Years of education: [name] [name]   Occupational History  . Not on file.   Social History Main Topics  . Smoking status: Former Smoker    Packs/day: 0.50    Years: 8.00    Types: Cigarettes    Quit date: 01/07/2011  . Smokeless tobacco: Never Used  . Alcohol use No  . Drug use: No  . Sexual activity: Not Currently   Other Topics Concern  . Not on file   Social History Narrative   Moved from [place], [place] to [place] in 11/2014.      Lives with fiancee      Quit smoking 02/2017    Allergies  Allergen Reactions  . Enalapril Hives and Rash  . Latex Rash  . Tape Rash    TAPE MAKES SKIN BREAK OUT AND TURN RED    Current Outpatient Prescriptions  Medication Sig Dispense Refill  . albuterol (PROVENTIL  HFA;VENTOLIN HFA) 108 (90 Base) MCG/ACT inhaler Inhale 1-2 puffs into the lungs every 6 (six) hours as needed for wheezing or shortness of breath. 1 Inhaler 0  . aspirin 81 MG chewable tablet Chew 1 tablet (81 mg total) by mouth daily.    [name] atorvastatin (LIPITOR) 20 MG tablet Take 20 mg by mouth at bedtime. Reported on 03/14/2016    . cinacalcet (SENSIPAR) 90 MG tablet Take 180 mg by mouth every Monday, Wednesday, and Friday with hemodialysis.     [name] multivitamin (RENA-VIT) TABS tablet Take 1 tablet by mouth at bedtime. 30 tablet 0  . nitroGLYCERIN (NITROSTAT) 0.4 MG SL tablet Place 0.4 mg under the tongue every 5 (five) minutes as needed for chest pain.    . sevelamer carbonate (RENVELA) 800 MG tablet Take 4 tablets (3,200 mg total) by mouth 3  (three) times daily with meals. 320 tablet 0  . amLODipine (NORVASC) 10 MG tablet Take 10 mg by mouth at bedtime.  3  . carvedilol (COREG) 25 MG tablet Take 1 tablet (25 mg total) by mouth 2 (two) times daily with a meal. (Patient not taking: Reported on 07/24/2017) 60 tablet 2  . doxylamine, Sleep, (UNISOM) 25 MG tablet Take 0.5-1 tablets (12.5-25 mg total) by mouth at bedtime. Take NO MORE THAN 25 mg for sleep every 24 hours. (Patient not taking: Reported on 07/24/2017) 30 tablet 0  . Nutritional Supplements (FEEDING SUPPLEMENT, NEPRO CARB STEADY,) LIQD Take 237 mLs by mouth 2 (two) times daily between meals. (Patient not taking: Reported on 07/24/2017) 30 Can 2  . pantoprazole (PROTONIX) 40 MG tablet Take 1 tablet (40 mg total) by mouth daily. (Patient not taking: Reported on 07/24/2017) 30 tablet 2   No current facility-administered medications for this visit.     REVIEW OF SYSTEMS:  [X]  denotes positive finding, [ ]  denotes negative finding Cardiac  Comments:  Chest pain or chest pressure: X   Shortness of breath upon exertion: X   Short of breath when lying flat:    Irregular heart rhythm:        Vascular    Pain in calf, thigh, or hip brought on by ambulation:    Pain in feet at night that wakes you up from your sleep:     Blood clot in your veins:    Leg swelling:         Pulmonary    Oxygen at home:    Productive cough:     Wheezing:         Neurologic    Sudden weakness in arms or legs:     Sudden numbness in arms or legs:     Sudden onset of difficulty speaking or slurred speech:    Temporary loss of vision in one eye:     Problems with dizziness:         Gastrointestinal    Blood in stool:     Vomited blood:         Genitourinary    Burning when urinating:     Blood in urine:        Psychiatric    Major depression:         Hematologic    Bleeding problems:    Problems with blood clotting too easily:        Skin    Rashes or ulcers:          Constitutional    Fever or chills:     PHYSICAL EXAM:  Vitals:   07/24/17 1148  BP: 114/69  Pulse: 81  Resp: 16  Temp: 98.7 F (37.1 C)  SpO2: 96%  Weight: 199 lb (90.3 kg)  Height: 6' (1.829 m)    GENERAL: The patient is a well-nourished male, in no acute distress. The vital signs are documented above. CARDIAC: There is a regular rate and rhythm.  VASCULAR: he has a good thrill in his left upper arm fistula. The fistula below the aneurysms by palpation is very calcified. PULMONARY: There is good air exchange bilaterally without wheezing or rales. MUSCULOSKELETAL: There are no major deformities or cyanosis. NEUROLOGIC: No focal weakness or paresthesias are detected. SKIN: there is a very superficial ulceration over the smaller aneurysmin the left upper arm fistula. The skin over this smaller aneurysm appears to be more thinned out that over the larger aneurysm. PSYCHIATRIC: The patient has a normal affect.  DATA:    ECHO: He had an echo on 03/12/2016 which showed ejection fraction of 45-50%. Systolic function was mildly reduced.  MEDICAL ISSUES:   ANEURYSMAL LEFT UPPER ARM AV FISTULA: this patient has had this fistula for many years and it is quite degenerative. He has 2 large aneurysms. The smaller aneurysm has a small eschar over this and the skin is somewhat thinned out over the aneurysm. I have recommended that we fix the smaller aneurysm first. Hopefully we can plicate this so that ultimately this area could be cannulated again. I do not think that would be possible to address both aneurysms and still have access for 2 needles. The fistula below the larger aneurysm is very calcified and it may be difficult to cannulate this area. In addition that calcium may make revision of this area more challenging. I've explained the option of either addressing both aneurysms in placing a catheter until the fistula can be used again versus doing aneurysms in a staged fashion. He would  like to do this in a staged fashion. He dialyzes on Monday Wednesdays and Fridays we will schedule this on a Tuesday, 07/30/2017.  Deitra Mayo [facility] [place] [phone]

## 2017-07-30 NOTE — Progress Notes (Signed)
Dr. Gennie Alma from anesthesia made aware of pt c/o intermittent SOB since yesterday following his dialysis treatment. Pt in no apparent respiratory distress. Lungs clear throughout, and O2 sat 98% on RA. Pt denies chest pain at this time.

## 2017-07-30 NOTE — Op Note (Signed)
    NAME: [name] [name]    MRN: [id number] DOB: 05-05-1956    DATE OF OPERATION: 07/30/2017  PREOP DIAGNOSIS:    Aneurysmal left upper arm AV fistula  POSTOP DIAGNOSIS:    Same  PROCEDURE:    Plication of left upper arm AV fistula  SURGEON: Judeth Cornfield. Scot Dock, MD, FACS  ASSIST: Gerri Lins PA  ANESTHESIA: Gen.    EBL: minimal.   INDICATIONS:    [name] is a 61 y.o. male who has a large aneurysmal left upper arm fistula. The proximal fistula has also been stented. He had one large aneurysm and a smaller adjacent aneurysm. I elected to address the larger aneurysm first. This would allow him adequate room to still cannulate the fistula above the revision.   Excellent thrill at the completion of the procedure. the aneurysm was plicated posteriorly so that the fistula can ultimately be cannulated anteriorly.  TECHNIQUE:  The patient was taken to the operating room and received a general anesthetic. The left upper extremity was prepped and draped in the usual sterile fashion. I infiltrated with 1% lidocaine with epinephrine. An elliptical incision was made encompassing this large aneurysm and the aneurysm was dissected free circumferentially. Because of the stent in the proximal fistula I could not clamp in this area and therefore elected to use a turning. The patient was heparinized. A tourniquet was placed on the upper arm and the arm was exsanguinated with an Esmarch bandage. The tourniquet was inflated to 250 mmHg. Under tourniquet control, the posterior aspect of the aneurysm was opened and an ellipse excised. This was then sewn back with 5-0 Prolene suture. The tourniquet was then released and the was still a good thrill in the fistula. Hemostasis was obtained in the wound. The heparin was partially reversed with protamine. The wound was closed deeply with 3-0 Vicryl and the skin closed with 4-0 Vicryl. Dermabond was applied. The patient tolerated the procedure well and was  transferred to the recovery room in stable condition. All needle and sponge counts were correct.   Deitra Mayo, MD, FACS [facility]  DATE OF DICTATION:   07/30/2017

## 2017-07-30 NOTE — Anesthesia Postprocedure Evaluation (Signed)
Anesthesia Post Note  Patient: [name] [name]  Procedure(s) Performed: PLICATION OF LEFT ARM ARTERIOVENOUS FISTULA (Left Arm Upper)     Patient location during evaluation: PACU Anesthesia Type: General Level of consciousness: sedated Pain management: pain level controlled Vital Signs Assessment: post-procedure vital signs reviewed and stable Respiratory status: spontaneous breathing and respiratory function stable Cardiovascular status: stable Postop Assessment: no apparent nausea or vomiting Anesthetic complications: no    Last Vitals:  Vitals:   07/30/17 0937 07/30/17 0952  BP: (!) 145/84 (!) 141/80  Pulse: 87 84  Resp: 11 15  Temp:  36.6 C  SpO2: 95% 95%    Last Pain:  Vitals:   07/30/17 1000  TempSrc:   PainSc: 0-No pain                 Aryani Daffern DANIEL

## 2017-07-30 NOTE — Transfer of Care (Signed)
Immediate Anesthesia Transfer of Care Note  Patient: [name] [name]  Procedure(s) Performed: PLICATION OF LEFT ARM ARTERIOVENOUS FISTULA (Left Arm Upper)  Patient Location: PACU  Anesthesia Type:General  Level of Consciousness: awake, alert  and oriented  Airway & Oxygen Therapy: Patient Spontanous Breathing and Patient connected to nasal cannula oxygen  Post-op Assessment: Report given to RN and Post -op Vital signs reviewed and stable  Post vital signs: Reviewed and stable  Last Vitals:  Vitals:   07/30/17 0604 07/30/17 0922  BP: (!) 162/84 (!) 149/90  Pulse: 82 94  Resp: 17 17  Temp: 36.7 C 36.6 C  SpO2: 98% 98%    Last Pain:  Vitals:   07/30/17 0922  TempSrc:   PainSc: 0-No pain      Patients Stated Pain Goal: 3 (15/05/69 7948)  Complications: No apparent anesthesia complications

## 2017-07-30 NOTE — Interval H&P Note (Signed)
History and Physical Interval Note:  07/30/2017 7:17 AM  [name] [name]  has presented today for surgery, with the diagnosis of pseudoaneurysm left arm  The various methods of treatment have been discussed with the patient and family. After consideration of risks, benefits and other options for treatment, the patient has consented to  Procedure(s): PLICATION OF LEFT ARM ARTERIOVENOUS FISTULA (Left) as a surgical intervention .  The patient's history has been reviewed, patient examined, no change in status, stable for surgery.  I have reviewed the patient's chart and labs.  Questions were answered to the patient's satisfaction.     Deitra Mayo

## 2017-07-31 ENCOUNTER — Encounter (HOSPITAL_COMMUNITY): Payer: Self-pay | Admitting: Vascular Surgery

## 2017-07-31 DIAGNOSIS — D509 Iron deficiency anemia, unspecified: Secondary | ICD-10-CM | POA: Diagnosis not present

## 2017-07-31 DIAGNOSIS — N2581 Secondary hyperparathyroidism of renal origin: Secondary | ICD-10-CM | POA: Diagnosis not present

## 2017-07-31 DIAGNOSIS — Z23 Encounter for immunization: Secondary | ICD-10-CM | POA: Diagnosis not present

## 2017-07-31 DIAGNOSIS — N186 End stage renal disease: Secondary | ICD-10-CM | POA: Diagnosis not present

## 2017-07-31 DIAGNOSIS — E1129 Type 2 diabetes mellitus with other diabetic kidney complication: Secondary | ICD-10-CM | POA: Diagnosis not present

## 2017-08-02 ENCOUNTER — Ambulatory Visit: Payer: Medicare Other | Admitting: Family

## 2017-08-02 DIAGNOSIS — N2581 Secondary hyperparathyroidism of renal origin: Secondary | ICD-10-CM | POA: Diagnosis not present

## 2017-08-02 DIAGNOSIS — E1129 Type 2 diabetes mellitus with other diabetic kidney complication: Secondary | ICD-10-CM | POA: Diagnosis not present

## 2017-08-02 DIAGNOSIS — N186 End stage renal disease: Secondary | ICD-10-CM | POA: Diagnosis not present

## 2017-08-02 DIAGNOSIS — D509 Iron deficiency anemia, unspecified: Secondary | ICD-10-CM | POA: Diagnosis not present

## 2017-08-02 DIAGNOSIS — Z23 Encounter for immunization: Secondary | ICD-10-CM | POA: Diagnosis not present

## 2017-08-05 DIAGNOSIS — D509 Iron deficiency anemia, unspecified: Secondary | ICD-10-CM | POA: Diagnosis not present

## 2017-08-05 DIAGNOSIS — N2581 Secondary hyperparathyroidism of renal origin: Secondary | ICD-10-CM | POA: Diagnosis not present

## 2017-08-05 DIAGNOSIS — N186 End stage renal disease: Secondary | ICD-10-CM | POA: Diagnosis not present

## 2017-08-05 DIAGNOSIS — Z23 Encounter for immunization: Secondary | ICD-10-CM | POA: Diagnosis not present

## 2017-08-05 DIAGNOSIS — E1129 Type 2 diabetes mellitus with other diabetic kidney complication: Secondary | ICD-10-CM | POA: Diagnosis not present

## 2017-08-07 DIAGNOSIS — N186 End stage renal disease: Secondary | ICD-10-CM | POA: Diagnosis not present

## 2017-08-07 DIAGNOSIS — Z23 Encounter for immunization: Secondary | ICD-10-CM | POA: Diagnosis not present

## 2017-08-07 DIAGNOSIS — E1129 Type 2 diabetes mellitus with other diabetic kidney complication: Secondary | ICD-10-CM | POA: Diagnosis not present

## 2017-08-07 DIAGNOSIS — E1122 Type 2 diabetes mellitus with diabetic chronic kidney disease: Secondary | ICD-10-CM | POA: Diagnosis not present

## 2017-08-07 DIAGNOSIS — N2581 Secondary hyperparathyroidism of renal origin: Secondary | ICD-10-CM | POA: Diagnosis not present

## 2017-08-07 DIAGNOSIS — D509 Iron deficiency anemia, unspecified: Secondary | ICD-10-CM | POA: Diagnosis not present

## 2017-08-07 DIAGNOSIS — Z992 Dependence on renal dialysis: Secondary | ICD-10-CM | POA: Diagnosis not present

## 2017-08-09 DIAGNOSIS — N186 End stage renal disease: Secondary | ICD-10-CM | POA: Diagnosis not present

## 2017-08-09 DIAGNOSIS — D509 Iron deficiency anemia, unspecified: Secondary | ICD-10-CM | POA: Diagnosis not present

## 2017-08-09 DIAGNOSIS — N2581 Secondary hyperparathyroidism of renal origin: Secondary | ICD-10-CM | POA: Diagnosis not present

## 2017-08-09 DIAGNOSIS — E1129 Type 2 diabetes mellitus with other diabetic kidney complication: Secondary | ICD-10-CM | POA: Diagnosis not present

## 2017-08-12 DIAGNOSIS — E1129 Type 2 diabetes mellitus with other diabetic kidney complication: Secondary | ICD-10-CM | POA: Diagnosis not present

## 2017-08-12 DIAGNOSIS — N2581 Secondary hyperparathyroidism of renal origin: Secondary | ICD-10-CM | POA: Diagnosis not present

## 2017-08-12 DIAGNOSIS — N186 End stage renal disease: Secondary | ICD-10-CM | POA: Diagnosis not present

## 2017-08-12 DIAGNOSIS — D509 Iron deficiency anemia, unspecified: Secondary | ICD-10-CM | POA: Diagnosis not present

## 2017-08-14 DIAGNOSIS — N2581 Secondary hyperparathyroidism of renal origin: Secondary | ICD-10-CM | POA: Diagnosis not present

## 2017-08-14 DIAGNOSIS — D509 Iron deficiency anemia, unspecified: Secondary | ICD-10-CM | POA: Diagnosis not present

## 2017-08-14 DIAGNOSIS — N186 End stage renal disease: Secondary | ICD-10-CM | POA: Diagnosis not present

## 2017-08-14 DIAGNOSIS — E1129 Type 2 diabetes mellitus with other diabetic kidney complication: Secondary | ICD-10-CM | POA: Diagnosis not present

## 2017-08-15 ENCOUNTER — Encounter: Payer: Medicare Other | Admitting: Internal Medicine

## 2017-08-15 NOTE — Assessment & Plan Note (Deleted)
Patient taking ASA 81 Daily, Atorvastatin 20mg  Daily, ----- and coreg. ------ Today, he says he is feeling well and he denies any chest pain or dyspnea.  Plan - ASA 81 Daily, Atorvastatin 20mg  Daily, ----- and coreg. - Pt is supposed to follow up with cardiology- he knows about the appt at [facility] ----

## 2017-08-15 NOTE — Assessment & Plan Note (Deleted)
Patient on HD, MWF. Recent successful plication of LUE AV fistula for aneurysm. - Continue HD

## 2017-08-15 NOTE — Assessment & Plan Note (Deleted)
Patient with a history of Essential Hypertension. Currently on Amlodipine 10mg  Daily. ---- Carvedilol 25mg  BID Last blood pressure 06/13/17 was at goal (133/70) -----  BP today --- / --- . - Amlodipine 10mg  Daily

## 2017-08-15 NOTE — Assessment & Plan Note (Deleted)
History of diet controlled DM2. Last 3: 5.6, 5.4, 5.0. Diabetes has resolved and no longer prediabetic. Patient has experienced hypoglycemia in the past. - Yearly A1C screening - Can discontinue year eye, foot, and micro alb screening (patient ESRD as well) - Carry Sugar tablets for hypoglycemic episodes

## 2017-08-15 NOTE — Assessment & Plan Note (Deleted)
Patient started on Protonix 03/07/17 for suspected GERD with symptoms of sour taste and chest discomfort. He has since stopped taking this medication because ------ - Continue to monitor

## 2017-08-15 NOTE — Assessment & Plan Note (Deleted)
Admitted with AMS in Aug 2018, determined to be 2/2 polypharmacy (lyrica and flexeril) in the setting of ESRD and resolved with discontinuation of these medication and dialysis. He has had no recurrence of confusion.  Patient has endorsed some trouble with long term memory. CT head during admission was unremarkable. MMSE on 06/13/17: 25/30 (unable to spell world backwards and draw intersecting pentagons). Patient has a 12th gradeeducation.  A - Likely mild cognitive impairment  P -continue to monitor, will likely need further testing with MOCA at next visit. -------- -can consider checking B12 and RPR at next visit --------

## 2017-08-16 DIAGNOSIS — D509 Iron deficiency anemia, unspecified: Secondary | ICD-10-CM | POA: Diagnosis not present

## 2017-08-16 DIAGNOSIS — E1129 Type 2 diabetes mellitus with other diabetic kidney complication: Secondary | ICD-10-CM | POA: Diagnosis not present

## 2017-08-16 DIAGNOSIS — N2581 Secondary hyperparathyroidism of renal origin: Secondary | ICD-10-CM | POA: Diagnosis not present

## 2017-08-16 DIAGNOSIS — N186 End stage renal disease: Secondary | ICD-10-CM | POA: Diagnosis not present

## 2017-08-19 ENCOUNTER — Encounter: Payer: Medicare Other | Admitting: Oncology

## 2017-08-19 DIAGNOSIS — E1129 Type 2 diabetes mellitus with other diabetic kidney complication: Secondary | ICD-10-CM | POA: Diagnosis not present

## 2017-08-19 DIAGNOSIS — N2581 Secondary hyperparathyroidism of renal origin: Secondary | ICD-10-CM | POA: Diagnosis not present

## 2017-08-19 DIAGNOSIS — N186 End stage renal disease: Secondary | ICD-10-CM | POA: Diagnosis not present

## 2017-08-19 DIAGNOSIS — D509 Iron deficiency anemia, unspecified: Secondary | ICD-10-CM | POA: Diagnosis not present

## 2017-08-20 ENCOUNTER — Encounter: Payer: Medicare Other | Admitting: Oncology

## 2017-08-21 DIAGNOSIS — N2581 Secondary hyperparathyroidism of renal origin: Secondary | ICD-10-CM | POA: Diagnosis not present

## 2017-08-21 DIAGNOSIS — D509 Iron deficiency anemia, unspecified: Secondary | ICD-10-CM | POA: Diagnosis not present

## 2017-08-21 DIAGNOSIS — N186 End stage renal disease: Secondary | ICD-10-CM | POA: Diagnosis not present

## 2017-08-21 DIAGNOSIS — E1129 Type 2 diabetes mellitus with other diabetic kidney complication: Secondary | ICD-10-CM | POA: Diagnosis not present

## 2017-08-23 DIAGNOSIS — E1129 Type 2 diabetes mellitus with other diabetic kidney complication: Secondary | ICD-10-CM | POA: Diagnosis not present

## 2017-08-23 DIAGNOSIS — D509 Iron deficiency anemia, unspecified: Secondary | ICD-10-CM | POA: Diagnosis not present

## 2017-08-23 DIAGNOSIS — N2581 Secondary hyperparathyroidism of renal origin: Secondary | ICD-10-CM | POA: Diagnosis not present

## 2017-08-23 DIAGNOSIS — N186 End stage renal disease: Secondary | ICD-10-CM | POA: Diagnosis not present

## 2017-08-25 DIAGNOSIS — E1129 Type 2 diabetes mellitus with other diabetic kidney complication: Secondary | ICD-10-CM | POA: Diagnosis not present

## 2017-08-25 DIAGNOSIS — D509 Iron deficiency anemia, unspecified: Secondary | ICD-10-CM | POA: Diagnosis not present

## 2017-08-25 DIAGNOSIS — N186 End stage renal disease: Secondary | ICD-10-CM | POA: Diagnosis not present

## 2017-08-25 DIAGNOSIS — N2581 Secondary hyperparathyroidism of renal origin: Secondary | ICD-10-CM | POA: Diagnosis not present

## 2017-08-27 DIAGNOSIS — N2581 Secondary hyperparathyroidism of renal origin: Secondary | ICD-10-CM | POA: Diagnosis not present

## 2017-08-27 DIAGNOSIS — N186 End stage renal disease: Secondary | ICD-10-CM | POA: Diagnosis not present

## 2017-08-27 DIAGNOSIS — E1129 Type 2 diabetes mellitus with other diabetic kidney complication: Secondary | ICD-10-CM | POA: Diagnosis not present

## 2017-08-27 DIAGNOSIS — D509 Iron deficiency anemia, unspecified: Secondary | ICD-10-CM | POA: Diagnosis not present

## 2017-08-30 DIAGNOSIS — N186 End stage renal disease: Secondary | ICD-10-CM | POA: Diagnosis not present

## 2017-08-30 DIAGNOSIS — N2581 Secondary hyperparathyroidism of renal origin: Secondary | ICD-10-CM | POA: Diagnosis not present

## 2017-08-30 DIAGNOSIS — E1129 Type 2 diabetes mellitus with other diabetic kidney complication: Secondary | ICD-10-CM | POA: Diagnosis not present

## 2017-08-30 DIAGNOSIS — D509 Iron deficiency anemia, unspecified: Secondary | ICD-10-CM | POA: Diagnosis not present

## 2017-09-02 DIAGNOSIS — D509 Iron deficiency anemia, unspecified: Secondary | ICD-10-CM | POA: Diagnosis not present

## 2017-09-02 DIAGNOSIS — N186 End stage renal disease: Secondary | ICD-10-CM | POA: Diagnosis not present

## 2017-09-02 DIAGNOSIS — E1129 Type 2 diabetes mellitus with other diabetic kidney complication: Secondary | ICD-10-CM | POA: Diagnosis not present

## 2017-09-02 DIAGNOSIS — N2581 Secondary hyperparathyroidism of renal origin: Secondary | ICD-10-CM | POA: Diagnosis not present

## 2017-09-04 DIAGNOSIS — D509 Iron deficiency anemia, unspecified: Secondary | ICD-10-CM | POA: Diagnosis not present

## 2017-09-04 DIAGNOSIS — N186 End stage renal disease: Secondary | ICD-10-CM | POA: Diagnosis not present

## 2017-09-04 DIAGNOSIS — E1129 Type 2 diabetes mellitus with other diabetic kidney complication: Secondary | ICD-10-CM | POA: Diagnosis not present

## 2017-09-04 DIAGNOSIS — N2581 Secondary hyperparathyroidism of renal origin: Secondary | ICD-10-CM | POA: Diagnosis not present

## 2017-09-06 DIAGNOSIS — E1122 Type 2 diabetes mellitus with diabetic chronic kidney disease: Secondary | ICD-10-CM | POA: Diagnosis not present

## 2017-09-06 DIAGNOSIS — N2581 Secondary hyperparathyroidism of renal origin: Secondary | ICD-10-CM | POA: Diagnosis not present

## 2017-09-06 DIAGNOSIS — N186 End stage renal disease: Secondary | ICD-10-CM | POA: Diagnosis not present

## 2017-09-06 DIAGNOSIS — D509 Iron deficiency anemia, unspecified: Secondary | ICD-10-CM | POA: Diagnosis not present

## 2017-09-06 DIAGNOSIS — Z992 Dependence on renal dialysis: Secondary | ICD-10-CM | POA: Diagnosis not present

## 2017-09-06 DIAGNOSIS — E1129 Type 2 diabetes mellitus with other diabetic kidney complication: Secondary | ICD-10-CM | POA: Diagnosis not present

## 2017-09-09 DIAGNOSIS — N186 End stage renal disease: Secondary | ICD-10-CM | POA: Diagnosis not present

## 2017-09-09 DIAGNOSIS — E1129 Type 2 diabetes mellitus with other diabetic kidney complication: Secondary | ICD-10-CM | POA: Diagnosis not present

## 2017-09-09 DIAGNOSIS — N2581 Secondary hyperparathyroidism of renal origin: Secondary | ICD-10-CM | POA: Diagnosis not present

## 2017-09-09 DIAGNOSIS — D509 Iron deficiency anemia, unspecified: Secondary | ICD-10-CM | POA: Diagnosis not present

## 2017-09-11 DIAGNOSIS — N2581 Secondary hyperparathyroidism of renal origin: Secondary | ICD-10-CM | POA: Diagnosis not present

## 2017-09-11 DIAGNOSIS — D509 Iron deficiency anemia, unspecified: Secondary | ICD-10-CM | POA: Diagnosis not present

## 2017-09-11 DIAGNOSIS — E1129 Type 2 diabetes mellitus with other diabetic kidney complication: Secondary | ICD-10-CM | POA: Diagnosis not present

## 2017-09-11 DIAGNOSIS — N186 End stage renal disease: Secondary | ICD-10-CM | POA: Diagnosis not present

## 2017-09-13 DIAGNOSIS — N186 End stage renal disease: Secondary | ICD-10-CM | POA: Diagnosis not present

## 2017-09-13 DIAGNOSIS — D509 Iron deficiency anemia, unspecified: Secondary | ICD-10-CM | POA: Diagnosis not present

## 2017-09-13 DIAGNOSIS — E1129 Type 2 diabetes mellitus with other diabetic kidney complication: Secondary | ICD-10-CM | POA: Diagnosis not present

## 2017-09-13 DIAGNOSIS — N2581 Secondary hyperparathyroidism of renal origin: Secondary | ICD-10-CM | POA: Diagnosis not present

## 2017-09-18 ENCOUNTER — Encounter: Payer: Medicare Other | Admitting: Internal Medicine

## 2017-09-18 DIAGNOSIS — D509 Iron deficiency anemia, unspecified: Secondary | ICD-10-CM | POA: Diagnosis not present

## 2017-09-18 DIAGNOSIS — E1129 Type 2 diabetes mellitus with other diabetic kidney complication: Secondary | ICD-10-CM | POA: Diagnosis not present

## 2017-09-18 DIAGNOSIS — N2581 Secondary hyperparathyroidism of renal origin: Secondary | ICD-10-CM | POA: Diagnosis not present

## 2017-09-18 DIAGNOSIS — N186 End stage renal disease: Secondary | ICD-10-CM | POA: Diagnosis not present

## 2017-09-20 DIAGNOSIS — E1129 Type 2 diabetes mellitus with other diabetic kidney complication: Secondary | ICD-10-CM | POA: Diagnosis not present

## 2017-09-20 DIAGNOSIS — N186 End stage renal disease: Secondary | ICD-10-CM | POA: Diagnosis not present

## 2017-09-20 DIAGNOSIS — N2581 Secondary hyperparathyroidism of renal origin: Secondary | ICD-10-CM | POA: Diagnosis not present

## 2017-09-20 DIAGNOSIS — D509 Iron deficiency anemia, unspecified: Secondary | ICD-10-CM | POA: Diagnosis not present

## 2017-09-23 DIAGNOSIS — N2581 Secondary hyperparathyroidism of renal origin: Secondary | ICD-10-CM | POA: Diagnosis not present

## 2017-09-23 DIAGNOSIS — D509 Iron deficiency anemia, unspecified: Secondary | ICD-10-CM | POA: Diagnosis not present

## 2017-09-23 DIAGNOSIS — N186 End stage renal disease: Secondary | ICD-10-CM | POA: Diagnosis not present

## 2017-09-23 DIAGNOSIS — E1129 Type 2 diabetes mellitus with other diabetic kidney complication: Secondary | ICD-10-CM | POA: Diagnosis not present

## 2017-09-25 DIAGNOSIS — N2581 Secondary hyperparathyroidism of renal origin: Secondary | ICD-10-CM | POA: Diagnosis not present

## 2017-09-25 DIAGNOSIS — D509 Iron deficiency anemia, unspecified: Secondary | ICD-10-CM | POA: Diagnosis not present

## 2017-09-25 DIAGNOSIS — N186 End stage renal disease: Secondary | ICD-10-CM | POA: Diagnosis not present

## 2017-09-25 DIAGNOSIS — E1129 Type 2 diabetes mellitus with other diabetic kidney complication: Secondary | ICD-10-CM | POA: Diagnosis not present

## 2017-09-27 DIAGNOSIS — N186 End stage renal disease: Secondary | ICD-10-CM | POA: Diagnosis not present

## 2017-09-27 DIAGNOSIS — E1129 Type 2 diabetes mellitus with other diabetic kidney complication: Secondary | ICD-10-CM | POA: Diagnosis not present

## 2017-09-27 DIAGNOSIS — D509 Iron deficiency anemia, unspecified: Secondary | ICD-10-CM | POA: Diagnosis not present

## 2017-09-27 DIAGNOSIS — N2581 Secondary hyperparathyroidism of renal origin: Secondary | ICD-10-CM | POA: Diagnosis not present

## 2017-09-29 DIAGNOSIS — E1129 Type 2 diabetes mellitus with other diabetic kidney complication: Secondary | ICD-10-CM | POA: Diagnosis not present

## 2017-09-29 DIAGNOSIS — N186 End stage renal disease: Secondary | ICD-10-CM | POA: Diagnosis not present

## 2017-09-29 DIAGNOSIS — D509 Iron deficiency anemia, unspecified: Secondary | ICD-10-CM | POA: Diagnosis not present

## 2017-09-29 DIAGNOSIS — N2581 Secondary hyperparathyroidism of renal origin: Secondary | ICD-10-CM | POA: Diagnosis not present

## 2017-10-02 DIAGNOSIS — N186 End stage renal disease: Secondary | ICD-10-CM | POA: Diagnosis not present

## 2017-10-02 DIAGNOSIS — D509 Iron deficiency anemia, unspecified: Secondary | ICD-10-CM | POA: Diagnosis not present

## 2017-10-02 DIAGNOSIS — N2581 Secondary hyperparathyroidism of renal origin: Secondary | ICD-10-CM | POA: Diagnosis not present

## 2017-10-02 DIAGNOSIS — E1129 Type 2 diabetes mellitus with other diabetic kidney complication: Secondary | ICD-10-CM | POA: Diagnosis not present

## 2017-10-04 DIAGNOSIS — N186 End stage renal disease: Secondary | ICD-10-CM | POA: Diagnosis not present

## 2017-10-04 DIAGNOSIS — E1129 Type 2 diabetes mellitus with other diabetic kidney complication: Secondary | ICD-10-CM | POA: Diagnosis not present

## 2017-10-04 DIAGNOSIS — D509 Iron deficiency anemia, unspecified: Secondary | ICD-10-CM | POA: Diagnosis not present

## 2017-10-04 DIAGNOSIS — N2581 Secondary hyperparathyroidism of renal origin: Secondary | ICD-10-CM | POA: Diagnosis not present

## 2017-10-07 DIAGNOSIS — Z992 Dependence on renal dialysis: Secondary | ICD-10-CM | POA: Diagnosis not present

## 2017-10-07 DIAGNOSIS — E1122 Type 2 diabetes mellitus with diabetic chronic kidney disease: Secondary | ICD-10-CM | POA: Diagnosis not present

## 2017-10-07 DIAGNOSIS — N186 End stage renal disease: Secondary | ICD-10-CM | POA: Diagnosis not present

## 2017-10-08 ENCOUNTER — Encounter (HOSPITAL_COMMUNITY): Payer: Self-pay

## 2017-10-08 ENCOUNTER — Emergency Department (HOSPITAL_COMMUNITY): Payer: Medicare Other

## 2017-10-08 ENCOUNTER — Other Ambulatory Visit: Payer: Self-pay

## 2017-10-08 ENCOUNTER — Inpatient Hospital Stay (HOSPITAL_COMMUNITY)
Admission: EM | Admit: 2017-10-08 | Discharge: 2017-10-09 | DRG: 640 | Disposition: A | Discharge status: Home / Self Care | Payer: Medicare Other | Attending: Internal Medicine | Admitting: Internal Medicine

## 2017-10-08 DIAGNOSIS — N186 End stage renal disease: Secondary | ICD-10-CM

## 2017-10-08 DIAGNOSIS — Z7982 Long term (current) use of aspirin: Secondary | ICD-10-CM

## 2017-10-08 DIAGNOSIS — F17210 Nicotine dependence, cigarettes, uncomplicated: Secondary | ICD-10-CM | POA: Diagnosis present

## 2017-10-08 DIAGNOSIS — Z9981 Dependence on supplemental oxygen: Secondary | ICD-10-CM

## 2017-10-08 DIAGNOSIS — D649 Anemia, unspecified: Secondary | ICD-10-CM | POA: Diagnosis present

## 2017-10-08 DIAGNOSIS — E875 Hyperkalemia: Secondary | ICD-10-CM | POA: Diagnosis not present

## 2017-10-08 DIAGNOSIS — Z955 Presence of coronary angioplasty implant and graft: Secondary | ICD-10-CM

## 2017-10-08 DIAGNOSIS — Z9115 Patient's noncompliance with renal dialysis: Secondary | ICD-10-CM | POA: Diagnosis not present

## 2017-10-08 DIAGNOSIS — Z8639 Personal history of other endocrine, nutritional and metabolic disease: Secondary | ICD-10-CM

## 2017-10-08 DIAGNOSIS — I5032 Chronic diastolic (congestive) heart failure: Secondary | ICD-10-CM | POA: Diagnosis present

## 2017-10-08 DIAGNOSIS — E1122 Type 2 diabetes mellitus with diabetic chronic kidney disease: Secondary | ICD-10-CM | POA: Diagnosis present

## 2017-10-08 DIAGNOSIS — R079 Chest pain, unspecified: Secondary | ICD-10-CM | POA: Diagnosis present

## 2017-10-08 DIAGNOSIS — N2581 Secondary hyperparathyroidism of renal origin: Secondary | ICD-10-CM | POA: Diagnosis present

## 2017-10-08 DIAGNOSIS — Z9861 Coronary angioplasty status: Secondary | ICD-10-CM

## 2017-10-08 DIAGNOSIS — G459 Transient cerebral ischemic attack, unspecified: Secondary | ICD-10-CM | POA: Diagnosis present

## 2017-10-08 DIAGNOSIS — I447 Left bundle-branch block, unspecified: Secondary | ICD-10-CM | POA: Diagnosis present

## 2017-10-08 DIAGNOSIS — R05 Cough: Secondary | ICD-10-CM | POA: Diagnosis not present

## 2017-10-08 DIAGNOSIS — I2510 Atherosclerotic heart disease of native coronary artery without angina pectoris: Secondary | ICD-10-CM | POA: Diagnosis present

## 2017-10-08 DIAGNOSIS — E8779 Other fluid overload: Secondary | ICD-10-CM

## 2017-10-08 DIAGNOSIS — I5189 Other ill-defined heart diseases: Secondary | ICD-10-CM | POA: Diagnosis present

## 2017-10-08 DIAGNOSIS — I132 Hypertensive heart and chronic kidney disease with heart failure and with stage 5 chronic kidney disease, or end stage renal disease: Secondary | ICD-10-CM | POA: Diagnosis present

## 2017-10-08 DIAGNOSIS — E1140 Type 2 diabetes mellitus with diabetic neuropathy, unspecified: Secondary | ICD-10-CM | POA: Diagnosis present

## 2017-10-08 DIAGNOSIS — R0602 Shortness of breath: Secondary | ICD-10-CM | POA: Diagnosis not present

## 2017-10-08 DIAGNOSIS — E8770 Fluid overload, unspecified: Secondary | ICD-10-CM | POA: Diagnosis not present

## 2017-10-08 DIAGNOSIS — R0781 Pleurodynia: Secondary | ICD-10-CM | POA: Diagnosis present

## 2017-10-08 DIAGNOSIS — Z992 Dependence on renal dialysis: Secondary | ICD-10-CM

## 2017-10-08 DIAGNOSIS — I10 Essential (primary) hypertension: Secondary | ICD-10-CM | POA: Diagnosis present

## 2017-10-08 LAB — BASIC METABOLIC PANEL
ANION GAP: 16 — AB (ref 5–15)
BUN: 71 mg/dL — ABNORMAL HIGH (ref 6–20)
CALCIUM: 8.9 mg/dL (ref 8.9–10.3)
CO2: 26 mmol/L (ref 22–32)
Chloride: 95 mmol/L — ABNORMAL LOW (ref 101–111)
Creatinine, Ser: 16.24 mg/dL — ABNORMAL HIGH (ref 0.61–1.24)
GFR, EST AFRICAN AMERICAN: 3 mL/min — AB (ref >60)
GFR, EST NON AFRICAN AMERICAN: 3 mL/min — AB (ref >60)
GLUCOSE: 92 mg/dL (ref 65–99)
POTASSIUM: 7.4 mmol/L — AB (ref 3.5–5.1)
Sodium: 137 mmol/L (ref 135–145)

## 2017-10-08 LAB — I-STAT TROPONIN, ED: TROPONIN I, POC: 0.02 ng/mL (ref 0.00–0.08)

## 2017-10-08 LAB — CBC
HEMATOCRIT: 35.5 % — AB (ref 39.0–52.0)
HEMOGLOBIN: 11.3 g/dL — AB (ref 13.0–17.0)
MCH: 27.9 pg (ref 26.0–34.0)
MCHC: 31.8 g/dL (ref 30.0–36.0)
MCV: 87.7 fL (ref 78.0–100.0)
Platelets: 158 10*3/uL (ref 150–400)
RBC: 4.05 MIL/uL — ABNORMAL LOW (ref 4.22–5.81)
RDW: 15.6 % — ABNORMAL HIGH (ref 11.5–15.5)
WBC: 8.5 10*3/uL (ref 4.0–10.5)

## 2017-10-08 LAB — MAGNESIUM: Magnesium: 2.9 mg/dL — ABNORMAL HIGH (ref 1.7–2.4)

## 2017-10-08 MED ORDER — INSULIN ASPART 100 UNIT/ML IV SOLN
10.0000 [IU] | Freq: Once | INTRAVENOUS | Status: AC
  Administered 2017-10-08: 10 [IU] via INTRAVENOUS
  Filled 2017-10-08: qty 0.1

## 2017-10-08 MED ORDER — DEXTROSE 50 % IV SOLN
1.0000 | Freq: Once | INTRAVENOUS | Status: AC
  Administered 2017-10-08: 50 mL via INTRAVENOUS
  Filled 2017-10-08: qty 50

## 2017-10-08 MED ORDER — CALCIUM GLUCONATE 10 % IV SOLN
1.0000 g | Freq: Once | INTRAVENOUS | Status: AC
  Administered 2017-10-08: 1 g via INTRAVENOUS
  Filled 2017-10-08: qty 10

## 2017-10-08 MED ORDER — CALCITRIOL 0.25 MCG PO CAPS
1.0000 ug | ORAL_CAPSULE | ORAL | Status: DC
  Administered 2017-10-09: 1 ug via ORAL

## 2017-10-08 MED ORDER — ALBUTEROL SULFATE (2.5 MG/3ML) 0.083% IN NEBU
10.0000 mg | INHALATION_SOLUTION | Freq: Once | RESPIRATORY_TRACT | Status: AC
  Administered 2017-10-08: 10 mg via RESPIRATORY_TRACT
  Filled 2017-10-08: qty 12

## 2017-10-08 MED ORDER — HEPARIN SODIUM (PORCINE) 1000 UNIT/ML DIALYSIS
8000.0000 [IU] | Freq: Once | INTRAMUSCULAR | Status: AC
  Administered 2017-10-08: 8000 [IU] via INTRAVENOUS_CENTRAL

## 2017-10-08 MED ORDER — CINACALCET HCL 30 MG PO TABS: 180.0000 mg | ORAL_TABLET | ORAL | Status: DC

## 2017-10-08 MED ORDER — ALBUTEROL SULFATE (2.5 MG/3ML) 0.083% IN NEBU: 10.0000 mg | INHALATION_SOLUTION | Freq: Once | RESPIRATORY_TRACT | Status: DC

## 2017-10-08 MED ORDER — CALCITRIOL 0.5 MCG PO CAPS
ORAL_CAPSULE | ORAL | Status: AC
  Filled 2017-10-08: qty 2

## 2017-10-08 NOTE — H&P (Signed)
Date: 10/08/2017               Patient Name:  [name] [name] MRN: [id number]  DOB: 25-Jun-1956 Age / Sex: 62 y.o., male   PCP: Neva Seat, MD         Medical Service: Internal Medicine Teaching Service         Attending Physician: Dr. Rebeca Alert Raynaldo Opitz, MD    First Contact: Dr. Maricela Bo Pager: [phone]  Second Contact: Dr. Danford Bad Pager: [phone]       After Hours (After 5p/  First Contact Pager: [phone]  weekends / holidays): Second Contact Pager: [phone]   Chief Complaint: Chest pain and SOB  History of Present Illness: [name] [name] is a 62 year old male with ESRD on HD who presented to the ED for sharp pleuritic chest pain and dyspnea that began the day of admission. He has an extensive PMHx significant for DMII, HTN, HFpEF, and CAD. He stated that he missed his last HD session as he failed to set an alarm and did not wake in time to utilize his transportation to HD this past Sunday (10/06/2017). The following day he began to feel weak, short of breath, with pain and tremor in his legs. He had difficulty walking primarily due to the leg "shaking" and weakness but also as a result of the dyspnea. The following morning he awoke to find that the pain and weakness was worse, the chest pain was much more pronounced and his dyspnea was intolerable. He recalled telling his wife that he thought his potassium must be up, as he often has the chest pain and leg pain/weakness when it is. As such he visited the ED as he knew he was in need of urgent HD and could not wait until his next HD session. In addition he has experienced several episodes of vomiting, and nausea that appears to be worsening since onset. He denied known sick contacts or the ingestion of unusual or undercooked foods.   He denied visual changes, headache, abdominal pain.  In the ED BMP indicated a potassium of 7.4, BUN of 71, Cr 16.24, and Cl of 95. CBC was relatively unremarkable without leukocytosis, but familiar chronic  anemia near baseline at 11.3. Troponin was 0.02, magnesium 2.9 with EKG indicating severe peaked T-waves. CXR was indicative of probable mild pulmonary edema. He was treated with hyperkalemic temporizing methods including calcium gluconate, albuterol, insulin and one amp dextrose. Emergent consult was placed to nephrology for HD and the IMTS team was consulted for admission. The patient was continued on cardiac monitoring without evidence of arrhythmia noted.   Meds:  Current Meds  Medication Sig  . albuterol (PROVENTIL HFA;VENTOLIN HFA) 108 (90 Base) MCG/ACT inhaler Inhale 1-2 puffs into the lungs every 6 (six) hours as needed for wheezing or shortness of breath.  [name] amLODipine (NORVASC) 10 MG tablet Take 10 mg by mouth at bedtime.  [name] aspirin 81 MG chewable tablet Chew 1 tablet (81 mg total) by mouth daily. (Patient taking differently: Chew 81 mg by mouth at bedtime. )  . atorvastatin (LIPITOR) 20 MG tablet Take 20 mg by mouth at bedtime. Reported on 03/14/2016  . carvedilol (COREG) 25 MG tablet Take 1 tablet (25 mg total) by mouth 2 (two) times daily with a meal. (Patient taking differently: Take 25 mg by mouth See admin instructions. Take 1 tablet (25 mg) by mouth daily at bedtime, take 1 tablet (25 mg) on Sunday, Tuesday, Thursday, Saturday mornings)  . multivitamin (RENA-VIT)  TABS tablet Take 1 tablet by mouth at bedtime.  . nitroGLYCERIN (NITROSTAT) 0.4 MG SL tablet Place 0.4 mg under the tongue every 5 (five) minutes as needed for chest pain.  . sevelamer carbonate (RENVELA) 800 MG tablet Take 4 tablets (3,200 mg total) by mouth 3 (three) times daily with meals. (Patient taking differently: Take 2,400-3,200 mg by mouth See admin instructions. Take 4 tablets (3200 mg) by mouth three times daily with meals and 3 tablets (2400 mg) with snacks)   Allergies: Allergies as of 10/08/2017 - Review Complete 10/08/2017  Allergen Reaction Noted  . Enalapril Hives and Rash 04/26/2014  . Latex Rash  09/23/2016  . Tape Rash 09/23/2016   Past Medical History:  Diagnosis Date  . Anemia   . Anginal pain ([place])   . CAD (coronary artery disease)    a. per [facility] s/p 3.18mm x 107mm Vision BMS to mid LAD 12/2009 and Xience DES to mid LAD 10/2010.  [name] Chronic diastolic CHF (congestive heart failure) ([place])   . Colon polyps   . Daily headache   . ESRD on dialysis [facility]) since ~ 2008   "MWF; [facility] Rinks" (03/04/2017)  . GERD (gastroesophageal reflux disease)   . Heart murmur   . Hematochezia    a. 2014: colonscopy, which showed moderately-sized internal hemorrhoids, two 5mm polyps in transverse colon and ascending colon that were resected, five 2-21mm polyps in sigmoid colon, descending colon, transverse colon, and ascending colon that were resected. An upper endoscopy was performed and showed normal esophagus, stomach, and duodenum.  . Hematuria    a. H/o hematuria 2014 with cystoscopy that was unrevealing for his source of hematuria. He underwent a kidney ultrasound on 10/14 that showed mildly echogenic and scarred kidneys compatible with medical renal disease, without hydronephrosis or renal calculi.  [name] History of blood transfusion    "had colonoscopy done; they had to give me some blood"  . Hyperlipidemia   . Hypertension   . On home oxygen therapy    "2L prn" (07/21/2015); "been off it for awhile" (03/04/2017)  . Renal insufficiency   . 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 Type II diabetes mellitus ([place])   . Wears dentures    Family History:  Family History  Problem Relation Age of Onset  . Bone cancer Mother   . Anuerysm Father   . Hypertension Unknown   . Diabetes type II Daughter    Social History:  Social History   Tobacco Use  . Smoking status: Current Some Day Smoker    Packs/day: 0.50    Years: 8.00    Pack years: 4.00    Types: Cigarettes  . Smokeless tobacco: Never Used  Substance Use Topics  . Alcohol use: No    Alcohol/week: 0.0  oz  . Drug use: No   Review of Systems: A complete ROS was negative except as per HPI.   Physical Exam: Blood pressure (!) 166/99, pulse 97, temperature (!) 97.5 F (36.4 C), temperature source Oral, resp. rate 18, SpO2 100 %. Physical Exam  Constitutional: He appears well-developed and well-nourished. No distress.  HENT:  Head: Normocephalic and atraumatic.  Eyes: Conjunctivae and EOM are normal.  Neck: Normal range of motion.  Cardiovascular: Normal rate and regular rhythm.  No murmur heard. Pulmonary/Chest: Effort normal. No stridor. No respiratory distress. He has rhonchi in the right lower field.  Abdominal: Soft. Bowel sounds are normal. He exhibits no distension.  Musculoskeletal: Normal range of motion. He exhibits no edema.       Right lower leg: He exhibits no edema.       Left lower leg: He exhibits no edema.  Neurological: He is alert.  Skin: Skin is warm. He is not diaphoretic.  Psychiatric: He has a normal mood and affect.  Nursing note and vitals reviewed.  CBC Latest Ref Rng & Units 10/08/2017 07/30/2017 06/08/2017  WBC 4.0 - 10.5 K/uL 8.5 4.5 5.7  Hemoglobin 13.0 - 17.0 g/dL 11.3(L) 12.8(L) 11.7(L)  Hematocrit 39.0 - 52.0 % 35.5(L) 40.2 36.3(L)  Platelets 150 - 400 K/uL 158 156 130(L)   BMP Latest Ref Rng & Units 10/08/2017 07/30/2017 06/08/2017  Glucose 65 - 99 mg/dL 92 78 81  BUN 6 - 20 mg/dL 71(H) 34(H) 27(H)  Creatinine 0.61 - 1.24 mg/dL 16.24(H) 10.55(H) 7.60(H)  Sodium 135 - 145 mmol/L 137 138 138  Potassium 3.5 - 5.1 mmol/L 7.4(HH) 5.1 4.0  Chloride 101 - 111 mmol/L 95(L) 95(L) 95(L)  CO2 22 - 32 mmol/L 26 28 29   Calcium 8.9 - 10.3 mg/dL 8.9 8.6(L) 8.9    EKG: personally reviewed my interpretation is sinus rhythm, with left BBB, peaked T-waves, lower end of normal QT interval.   CXR: personally reviewed my interpretation is there is increased opacification of the diaphragmatic boarder with slight blunting of the costophrenic angels. Although an AP film,  the cardiac silhouette appears slightly enlarge to at least 50% of the chest diameter. Consistent with mild pulmonary edema.   Assessment & Plan by Problem: Active Problems:   Hypertension   CAD -S/P LAD BMS 2011, LAD DES 2012- patent cors Feb 2016   Diabetes mellitus type 2, diet-controlled ([place])   Diastolic dysfunction-grade 2   H/O TIA (transient ischemic attack)   ESRD (end stage renal disease) ([place])   Chest pain   ESRD (end stage renal disease) on dialysis ([facility])  [name] [name] is a pleasant 62 year old male who presented to the ED for chest pain and shortness of breath. Given the history and physical presentation in conjunction with his BMP abnormalities, CXR and EKG findings he is most likely suffering from hypervolemia and hyperkalemia secondary to missing hemodialysis. Considered on the differential were pneumonia, ACS, acute CHF exacerbation, severe electrolyte abnormalities initially.    Pleuritic Chest Pain: Most likely secondary to electrolyte abnormalities in conjunction with increased cardiac demand from hypervolemia/pulmonary edema as it resolved with HD. Chest pain was worse with cough and deep breaths, pleuritic by nature. It occurred for approximately 12-18 hours as per the patient.  --Troponin 0.02 initially, pain exceeded 12 hours, increase expected at this juncture  --EKG indicating T-wave peaks and as above --Will continue cardiac monitoring  --Treat underlying hypervolemia  --Does not appear to be cardiac in nature, often experiences chest pain improving with HD --Zofran PRN for chest pain, QTc low --Repeat EKG in am for resolution of abnormalities  --Held nitro on admission, please call MD for chest pain recurrence and obtain STAT EKG --Cardiac Stress test on 11/07/2015 was low risk.  ESRD on HD MWF: Long history of ESRD on HD. Missed his last dialysis session on Sunday for missing his transportation due to sleeping past his departure time.  --Cinacalcet MWF w/  HD --sevelamer carbonate 3200mg  TID WC --Nephrology consulted w/ patient undergoing HD during evaluation by primary MD team  HTN: History of HTN, moderately well controlled on home antihypertensives and HD. --Continue amlodipine 10mg  daily --Continue Carvedilol 25mg  QHS daily, take  one additional tablet T, Th, Sa, Sunday in the morning, alternating with HD days for better BP control?.   Diabetic neuropathy: Previous history of neuropathy, no complaints or active medical treatment listed at this time.  DMII: Diet controlled. --Will continue to monitor  --Placed on renal/carb modified diet  HFpEF G/2 DD: Most recent Echo in 03/2016 indicated an EF of 45-50% consistent with prevoius studies indicating mild LV hypertrophy and dilation of the left atrium.  --Continue Carvedilol 25mg  QHS daily, take one additional tablet T, Th, Sa, Sunday in the morning. --Continue atorvastatin 20mg   Diet: Renal/carb modified  Code: Full Fluids: None, hypervolemic  DVT PPX: heparin Dispo: Admit patient to Inpatient with expected length of stay greater than 2 midnights.  Signed: Kathi Ludwig, MD 10/08/2017, 8:31 PM  Pager: Pager# [phone]

## 2017-10-08 NOTE — ED Provider Notes (Signed)
[facility] EMERGENCY DEPARTMENT Provider Note   CSN: [id number] Arrival date & time: 10/08/17  1547     History   Chief Complaint Chief Complaint  Patient presents with  . Chest Pain  . Shortness of Breath    HPI [name] [name] is a 62 y.o. male with history of ESRD MWF, CAD status post stents, CHF, hyperlipidemia, hypertension, diabetes  presents to ED for evaluation of shortness of breath worse with laying flat and exertion since yesterday. Associated symptoms include generalized weakness, wet sounding cough and left sided CP. Left sided chest pain is sharp, intermittent worse with cough and taking deep breaths. ESRD HD last session Friday and missed Sunday session. No h/o COPD. Smokes 1 cigar once weekly.  HPI  Past Medical History:  Diagnosis Date  . Anemia   . Anginal pain ([place])   . CAD (coronary artery disease)    a. per [facility] s/p 3.52mm x 55mm Vision BMS to mid LAD 12/2009 and Xience DES to mid LAD 10/2010.  [name] Chronic diastolic CHF (congestive heart failure) ([place])   . Colon polyps   . Daily headache   . ESRD on dialysis [facility]) since ~ 2008   "MWF; Jeneen Rinks" (03/04/2017)  . GERD (gastroesophageal reflux disease)   . Heart murmur   . Hematochezia    a. 2014: colonscopy, which showed moderately-sized internal hemorrhoids, two 64mm polyps in transverse colon and ascending colon that were resected, five 2-68mm polyps in sigmoid colon, descending colon, transverse colon, and ascending colon that were resected. An upper endoscopy was performed and showed normal esophagus, stomach, and duodenum.  . Hematuria    a. H/o hematuria 2014 with cystoscopy that was unrevealing for his source of hematuria. He underwent a kidney ultrasound on 10/14 that showed mildly echogenic and scarred kidneys compatible with medical renal disease, without hydronephrosis or renal calculi.  [name] History of blood transfusion    "had colonoscopy done; they had to give me some blood"  .  Hyperlipidemia   . Hypertension   . On home oxygen therapy    "2L prn" (07/21/2015); "been off it for awhile" (03/04/2017)  . Renal insufficiency   . 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 Type II diabetes mellitus ([place])   . Wears dentures     Patient Active Problem List   Diagnosis Date Noted  . Mild cognitive impairment 06/13/2017  . Chest pain 06/07/2017  . Acute metabolic encephalopathy   . Skin ulcer of toe of right foot, limited to breakdown of skin ([place])   . Callus of foot 03/07/2017  . ESRD (end stage renal disease) ([place]) 03/18/2016  . Hypertensive heart disease with heart failure ([place])   . Mitral regurgitation   . Leukocytosis 12/23/2015  . Diabetic neuropathy ([place]) 07/29/2015  . Depression 07/22/2015  . GERD (gastroesophageal reflux disease) 06/04/2015  . Malnutrition of moderate degree ([place]) 05/17/2015  . Thrombocytopenia ([place]) 05/16/2015  . Weight loss 05/16/2015  . H/O TIA (transient ischemic attack) 04/01/2015  . Diastolic dysfunction-grade 2 12/13/2014  . Hypertension 04/27/2014  . CAD -S/P LAD BMS 2011, LAD DES 2012- patent cors Feb 2016 04/27/2014  . Diabetes mellitus type 2, diet-controlled ([place]) 04/27/2014    Past Surgical History:  Procedure Laterality Date  . AV FISTULA PLACEMENT Left ~ 2007   "upper arm"  . CARDIAC CATHETERIZATION  "several"  . CORONARY ANGIOPLASTY WITH STENT PLACEMENT  "several"  . CYSTOSCOPY W/ STONE MANIPULATION  X2?  . EYE SURGERY Bilateral    "laser OR for hemorrhage"  . LEFT HEART CATHETERIZATION WITH CORONARY ANGIOGRAM N/A 11/23/2014   Procedure: LEFT HEART CATHETERIZATION WITH CORONARY ANGIOGRAM;  Surgeon: Troy Sine, MD;  Location[facility] CATH LAB;  Service: Cardiovascular;  Laterality: N/A;  . LITHOTRIPSY  X1  . REVISON OF ARTERIOVENOUS FISTULA Left 02/72/5366   Procedure: PLICATION OF LEFT ARM ARTERIOVENOUS FISTULA;  Surgeon: Angelia Mould, MD;  Location: [facility];  Service: Vascular;   Laterality: Left;       Home Medications    Prior to Admission medications   Medication Sig Start Date End Date Taking? Authorizing Provider  albuterol (PROVENTIL HFA;VENTOLIN HFA) 108 (90 Base) MCG/ACT inhaler Inhale 1-2 puffs into the lungs every 6 (six) hours as needed for wheezing or shortness of breath. 01/19/16  Yes Delsa Grana, PA-C  amLODipine (NORVASC) 10 MG tablet Take 10 mg by mouth at bedtime. 05/03/17  Yes [provider]  aspirin 81 MG chewable tablet Chew 1 tablet (81 mg total) by mouth daily. Patient taking differently: Chew 81 mg by mouth at bedtime.  07/22/15  Yes Iline Oven, MD  atorvastatin (LIPITOR) 20 MG tablet Take 20 mg by mouth at bedtime. Reported on 03/14/2016   Yes [provider]  carvedilol (COREG) 25 MG tablet Take 1 tablet (25 mg total) by mouth 2 (two) times daily with a meal. Patient taking differently: Take 25 mg by mouth See admin instructions. Take 1 tablet (25 mg) by mouth daily at bedtime, take 1 tablet (25 mg) on Sunday, Tuesday, Thursday, Saturday mornings 02/08/17  Yes Lorella Nimrod, MD  multivitamin (RENA-VIT) TABS tablet Take 1 tablet by mouth at bedtime. 12/15/14  Yes Geradine Girt, DO  nitroGLYCERIN (NITROSTAT) 0.4 MG SL tablet Place 0.4 mg under the tongue every 5 (five) minutes as needed for chest pain.   Yes [provider]  sevelamer carbonate (RENVELA) 800 MG tablet Take 4 tablets (3,200 mg total) by mouth 3 (three) times daily with meals. Patient taking differently: Take 2,400-3,200 mg by mouth See admin instructions. Take 4 tablets (3200 mg) by mouth three times daily with meals and 3 tablets (2400 mg) with snacks 03/12/16  Yes Geradine Girt, DO  cinacalcet (SENSIPAR) 90 MG tablet Take 180 mg by mouth every Monday, Wednesday, and Friday with hemodialysis.     [provider]  doxylamine, Sleep, (UNISOM) 25 MG tablet Take 0.5-1 tablets (12.5-25 mg total) by mouth at bedtime. Take NO MORE THAN 25 mg for  sleep every 24 hours. Patient not taking: Reported on 07/24/2017 06/06/17   Margarita Mail, PA-C  Nutritional Supplements (FEEDING SUPPLEMENT, NEPRO CARB STEADY,) LIQD Take 237 mLs by mouth 2 (two) times daily between meals. Patient not taking: Reported on 07/24/2017 06/08/17   Lorella Nimrod, MD  oxyCODONE-acetaminophen (ROXICET) 5-325 MG tablet Take 1-2 tablets by mouth every 4 (four) hours as needed. Patient not taking: Reported on 10/08/2017 07/30/17   Angelia Mould, MD  pantoprazole (PROTONIX) 40 MG tablet Take 1 tablet (40 mg total) by mouth daily. Patient not taking: Reported on 07/24/2017 03/07/17 03/07/18  Dellia Nims, MD    Family History Family History  Problem Relation Age of Onset  . Bone cancer Mother   . Anuerysm Father   . Hypertension Unknown   . Diabetes type II Daughter     Social History Social History   Tobacco Use  . Smoking status: Current Some Day Smoker    Packs/day:  0.50    Years: 8.00    Pack years: 4.00    Types: Cigarettes  . Smokeless tobacco: Never Used  Substance Use Topics  . Alcohol use: No    Alcohol/week: 0.0 oz  . Drug use: No     Allergies   Enalapril; Latex; and Tape   Review of Systems Review of Systems  Respiratory: Positive for cough and shortness of breath.   Cardiovascular: Positive for chest pain.  Neurological: Positive for weakness (generalized).  All other systems reviewed and are negative.    Physical Exam Updated Vital Signs BP (!) 152/93   Pulse 87   Temp (!) 97.5 F (36.4 C) (Oral)   Resp 19   SpO2 96%   Physical Exam  Constitutional: He is oriented to person, place, and time. He appears well-developed and well-nourished. No distress.  Increased WOB. AxO to self, place and time.   HENT:  Head: Normocephalic and atraumatic.  Right Ear: External ear normal.  Left Ear: External ear normal.  Nose: Nose normal.  Eyes: Conjunctivae and EOM are normal. Pupils are equal, round, and reactive to light. No  scleral icterus.  Neck: Normal range of motion. Neck supple.  Cardiovascular: Regular rhythm, normal heart sounds and intact distal pulses. Tachycardia present.  No murmur heard. HR 95-125. RRR. Intact radial and DP pulses bilaterally. No LE edema.   Pulmonary/Chest: Tachypnea noted. He has wheezes. He has rales.  Diffuse rales and rhonchi in all lung fields worse at lower lobes bilaterally.   Abdominal: Soft. There is no tenderness.  Musculoskeletal: Normal range of motion. He exhibits no deformity.  Neurological: He is alert and oriented to person, place, and time.  Skin: Skin is warm and dry. Capillary refill takes less than 2 seconds.  Psychiatric: He has a normal mood and affect. His behavior is normal. Judgment and thought content normal.  Nursing note and vitals reviewed.    ED Treatments / Results  Labs (all labs ordered are listed, but only abnormal results are displayed) Labs Reviewed  BASIC METABOLIC PANEL - Abnormal; Notable for the following components:      Result Value   Potassium 7.4 (*)    Chloride 95 (*)    BUN 71 (*)    Creatinine, Ser 16.24 (*)    GFR calc non Af Amer 3 (*)    GFR calc Af Amer 3 (*)    Anion gap 16 (*)    All other components within normal limits  CBC - Abnormal; Notable for the following components:   RBC 4.05 (*)    Hemoglobin 11.3 (*)    HCT 35.5 (*)    RDW 15.6 (*)    All other components within normal limits  I-STAT TROPONIN, ED    EKG  EKG Interpretation  Date/Time:  Tuesday October 08 2017 15:51:39 EST Ventricular Rate:  188 PR Interval:  172 QRS Duration: 114 QT Interval:  192 QTC Calculation: 339 R Axis:   80 Text Interpretation:  Sinus tachycardia with frequent Premature ventricular complexes in a pattern of bigeminy Incomplete left bundle branch block Nonspecific T wave abnormality Abnormal ECG peaked TWaves HR approx 95bpm. reading Twaves for rate of 188. No stemi Confirmed by Addison Lank [phone]) on 10/08/2017 4:08:31  PM       Radiology Dg Chest 2 View  Result Date: 10/08/2017 CLINICAL DATA:  Chest pain with dyspnea since yesterday. EXAM: CHEST  2 VIEW COMPARISON:  06/06/2017 FINDINGS: Re- demonstration of cardiomegaly with pulmonary vascular redistribution  consistent with CHF. Central pulmonary vascular engorgement since prior. Superimposed adenopathy is not entirely excluded but findings are more likely related to CHF and pulmonary vascular congestion. No significant pleural effusion or pneumothorax. No pneumonic consolidations. IMPRESSION: Cardiomegaly with interval development of CHF. Electronically Signed   By: Ashley Royalty M.D.   On: 10/08/2017 16:35    Procedures Procedures (including critical care time)  CRITICAL CARE Performed by: Kinnie Feil   Total critical care time: 35 minutes  Critical care time was exclusive of separately billable procedures and treating other patients.  Critical care was necessary to treat or prevent imminent or life-threatening deterioration.  Critical care was time spent personally by me on the following activities: development of treatment plan with patient and/or surrogate as well as nursing, discussions with consultants, evaluation of patient's response to treatment, examination of patient, obtaining history from patient or surrogate, ordering and performing treatments and interventions, ordering and review of laboratory studies, ordering and review of radiographic studies, pulse oximetry and re-evaluation of patient's condition.  Medications Ordered in ED Medications  calcium gluconate inj 10% (1 g) URGENT USE ONLY! (not administered)  albuterol (PROVENTIL) (2.5 MG/3ML) 0.083% nebulizer solution 10 mg (not administered)  insulin aspart (novoLOG) injection 10 Units (not administered)     Initial Impression / Assessment and Plan / ED Course  I have reviewed the triage vital signs and the nursing notes.  Pertinent labs & imaging results that were  available during my care of the patient were reviewed by me and considered in my medical decision making (see chart for details).  Clinical Course as of Oct 08 1752  Tue Oct 08, 2017  1730 Potassium: (!!) 7.4 [CG]  1730 Creatinine: (!) 16.24 [CG]  1735 Sinus tachycardia with frequent Premature ventricular complexes in a pattern of bigeminy Incomplete left bundle branch block Nonspecific T wave abnormality Abnormal ECG peaked TWaves HR approx 95bpm. reading Twaves for rate of 188. No stemi Confirmed by Addison Lank [phone]) on 10/08/2017 4:08:31 PM EKG 12-Lead [CG]  1743 FINDINGS: Re- demonstration of cardiomegaly with pulmonary vascular redistribution consistent with CHF. Central pulmonary vascular engorgement since prior. Superimposed adenopathy is not entirely excluded but findings are more likely related to CHF and pulmonary vascular congestion. No significant pleural effusion or pneumothorax. No pneumonic consolidations. DG Chest 2 View [CG]    Clinical Course User Index [CG] Kinnie Feil, PA-C   62 yo male presents with SOB and chest pain. PMH significant for ESRD on HD MWF and CAD s/p stents. Missed dialysis on Sunday, last full session three days ago. No fevers or chills.   On exam he is tachycardic, tachypnic. Diffuse rales/rhonchi on lung exam. No LE edema.   Work up remarkable for hyperkalemia K 7.4, creatinine 16.24, BUN 71, hemoglobin 11.3. CXR shows vascular congestion c/w CHF w/o consolidations. EKG with hyperkalemia changes including peaked T waves.   Final Clinical Impressions(s) / ED Diagnoses   Will give calcium gluconate, insulin/dextrose, albuterol. Pending nephrology consult. Pt will need emergent HD.   1745: Spoke to Dr Lorrene Reid who will see pt.   Final diagnoses:  None    ED Discharge Orders    None       Arlean Hopping 10/09/17 1732    Fatima Blank, MD 10/12/17 [phone]

## 2017-10-08 NOTE — Progress Notes (Signed)
HD tx initiated via 15Gx2 w/o problem by Almyra Brace, RN, pull/push/flush equally w/o problem, VSS, will cont to monitor while on HD tx

## 2017-10-08 NOTE — Consult Note (Signed)
[facility] Consultation Note Requesting Physician:  EDP Primary Nephrologist: Deterding Marcine Matar) Reason for Consult:  Hyperkalemia   HPI: The patient is a 62 y.o. year-old HTN, DM, CAD w/stent, HLD, dCHF, GERD, secondary HPT. Usual HD is MWF [facility]. Last HD was Friday 12/28, missed his scheduled dialysis on Sunday 12/30 (holiday schedule) so presents to the ED with chest pain, SOB, hyperkalemia (K 7.4) with ECG changes, generalized weakness. Treated with Ca, glucose, insulin in the ED, admitted for emergent dialysis.   As above, last TMT was 12/28, ran full time, left 1.6 kg over EDW (has not been reaching) Says he missed his Sunday TMT 2/2 overslept and "transportation did not knock on my door". Had severe SOB, pleuritic type CP (he says NOT similar to prior anginal pain). No chills or fevers.    Past Medical History:  Diagnosis Date  . Anemia   . Anginal pain ([place])   . CAD (coronary artery disease)    a. per [facility] s/p 3.39mm x 70mm Vision BMS to mid LAD 12/2009 and Xience DES to mid LAD 10/2010.  [name] Chronic diastolic CHF (congestive heart failure) ([place])   . Colon polyps   . Daily headache   . ESRD on dialysis [facility]) since ~ 2008   "MWF; Jeneen Rinks" (03/04/2017)  . GERD (gastroesophageal reflux disease)   . Heart murmur   . Hematochezia    a. 2014: colonscopy, which showed moderately-sized internal hemorrhoids, two 66mm polyps in transverse colon and ascending colon that were resected, five 2-57mm polyps in sigmoid colon, descending colon, transverse colon, and ascending colon that were resected. An upper endoscopy was performed and showed normal esophagus, stomach, and duodenum.  . Hematuria    a. H/o hematuria 2014 with cystoscopy that was unrevealing for his source of hematuria. He underwent a kidney ultrasound on 10/14 that showed mildly echogenic and scarred kidneys compatible with medical renal disease, without hydronephrosis or renal calculi.  [name] History of blood transfusion    "had  colonoscopy done; they had to give me some blood"  . Hyperlipidemia   . Hypertension   . On home oxygen therapy    "2L prn" (07/21/2015); "been off it for awhile" (03/04/2017)  . Renal insufficiency   . 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 Type II diabetes mellitus ([place])   . Wears dentures     Past Surgical History:  Procedure Laterality Date  . AV FISTULA PLACEMENT Left ~ 2007   "upper arm"  . CARDIAC CATHETERIZATION  "several"  . CORONARY ANGIOPLASTY WITH STENT PLACEMENT  "several"  . CYSTOSCOPY W/ STONE MANIPULATION  X2?  . EYE SURGERY Bilateral    "laser OR for hemorrhage"  . LEFT HEART CATHETERIZATION WITH CORONARY ANGIOGRAM N/A 11/23/2014   Procedure: LEFT HEART CATHETERIZATION WITH CORONARY ANGIOGRAM;  Surgeon: Troy Sine, MD;  Location: [facility] CATH LAB;  Service: Cardiovascular;  Laterality: N/A;  . LITHOTRIPSY  X1  . REVISON OF ARTERIOVENOUS FISTULA Left 69/67/8938   Procedure: PLICATION OF LEFT ARM ARTERIOVENOUS FISTULA;  Surgeon: Angelia Mould, MD;  Location: [facility] OR;  Service: Vascular;  Laterality: Left;    Family History  Problem Relation Age of Onset  . Bone cancer Mother   . Anuerysm Father   . Hypertension Unknown   . Diabetes type II Daughter    Social History:  reports that he has been smoking cigarettes.  He has a 4.00 pack-year smoking history. he has never used smokeless  tobacco. He reports that he does not drink alcohol or use drugs.  Allergies  Allergen Reactions  . Enalapril Hives and Rash  . Latex Rash  . Tape Rash    TAPE MAKES SKIN BREAK OUT AND TURN RED     Prior to Admission medications   Medication Sig Start Date End Date Taking? Authorizing Provider  albuterol (PROVENTIL HFA;VENTOLIN HFA) 108 (90 Base) MCG/ACT inhaler Inhale 1-2 puffs into the lungs every 6 (six) hours as needed for wheezing or shortness of breath. 01/19/16  Yes Delsa Grana, PA-C  amLODipine (NORVASC) 10 MG tablet Take 10 mg by mouth at  bedtime. 05/03/17  Yes [provider]  aspirin 81 MG chewable tablet Chew 1 tablet (81 mg total) by mouth daily. Patient taking differently: Chew 81 mg by mouth at bedtime.  07/22/15  Yes Iline Oven, MD  atorvastatin (LIPITOR) 20 MG tablet Take 20 mg by mouth at bedtime. Reported on 03/14/2016   Yes [provider]  carvedilol (COREG) 25 MG tablet Take 1 tablet (25 mg total) by mouth 2 (two) times daily with a meal. Patient taking differently: Take 25 mg by mouth See admin instructions. Take 1 tablet (25 mg) by mouth daily at bedtime, take 1 tablet (25 mg) on Sunday, Tuesday, Thursday, Saturday mornings 02/08/17  Yes Lorella Nimrod, MD  multivitamin (RENA-VIT) TABS tablet Take 1 tablet by mouth at bedtime. 12/15/14  Yes Geradine Girt, DO  nitroGLYCERIN (NITROSTAT) 0.4 MG SL tablet Place 0.4 mg under the tongue every 5 (five) minutes as needed for chest pain.   Yes [provider]  sevelamer carbonate (RENVELA) 800 MG tablet Take 4 tablets (3,200 mg total) by mouth 3 (three) times daily with meals. Patient taking differently: Take 2,400-3,200 mg by mouth See admin instructions. Take 4 tablets (3200 mg) by mouth three times daily with meals and 3 tablets (2400 mg) with snacks 03/12/16  Yes Geradine Girt, DO  cinacalcet (SENSIPAR) 90 MG tablet Take 180 mg by mouth every Monday, Wednesday, and Friday with hemodialysis.     [provider]  doxylamine, Sleep, (UNISOM) 25 MG tablet Take 0.5-1 tablets (12.5-25 mg total) by mouth at bedtime. Take NO MORE THAN 25 mg for sleep every 24 hours. Patient not taking: Reported on 07/24/2017 06/06/17   Margarita Mail, PA-C  Nutritional Supplements (FEEDING SUPPLEMENT, NEPRO CARB STEADY,) LIQD Take 237 mLs by mouth 2 (two) times daily between meals. Patient not taking: Reported on 07/24/2017 06/08/17   Lorella Nimrod, MD  oxyCODONE-acetaminophen (ROXICET) 5-325 MG tablet Take 1-2 tablets by mouth every 4 (four) hours as  needed. Patient not taking: Reported on 10/08/2017 07/30/17   Angelia Mould, MD  pantoprazole (PROTONIX) 40 MG tablet Take 1 tablet (40 mg total) by mouth daily. Patient not taking: Reported on 07/24/2017 03/07/17 03/07/18  Dellia Nims, MD    Inpatient medications: . albuterol  10 mg Nebulization Once  . [START ON 10/09/2017] calcitRIOL  1 mcg Oral Q M,W,F-HD  . calcium gluconate  1 g Intravenous Once  . [START ON 10/09/2017] cinacalcet  195 mg Oral Q M,W,F-HD  . dextrose  1 ampule Intravenous Once  . insulin aspart  10 Units Intravenous Once    Review of Systems See HPI  Physical Exam:  Blood pressure (!) 166/99, pulse 87, temperature (!) 97.5 F (36.4 C), temperature source Oral, resp. rate (!) 40, SpO2 94 %.  Gen: Acutely ill appearing, on nasal O2 Denies CP currently Skin: no rash, cyanosis.  Multiple tattoos Neck: +JVD to jaw angle, no bruits  Chest: Diffuse crackles. ^ WOB Heart: S1S2 NO S3 Abdomen: soft, not tender Ext: No sig edema Neuro: alert, Ox3, no focal deficit Dialysis Access: L AVF upper arm, large, + bruit   Recent Labs  Lab 10/08/17 1601  NA 137  K 7.4*  CL 95*  CO2 26  GLUCOSE 92  BUN 71*  CREATININE 16.24*  CALCIUM 8.9    Recent Labs  Lab 10/08/17 1601  WBC 8.5  HGB 11.3*  HCT 35.5*  MCV 87.7  PLT 158   Xrays/Other Studies: Dg Chest 2 View  Result Date: 10/08/2017 CLINICAL DATA:  Chest pain with dyspnea since yesterday. EXAM: CHEST  2 VIEW COMPARISON:  06/06/2017 FINDINGS: Re- demonstration of cardiomegaly with pulmonary vascular redistribution consistent with CHF. Central pulmonary vascular engorgement since prior. Superimposed adenopathy is not entirely excluded but findings are more likely related to CHF and pulmonary vascular congestion. No significant pleural effusion or pneumothorax. No pneumonic consolidations. IMPRESSION: Cardiomegaly with interval development of CHF. Electronically Signed   By: Ashley Royalty M.D.   On: 10/08/2017  16:35   Dialysis orders MWF [facility] 4 hours 500/800 EDW 92.5 kg 2K2Ca Heparin 8000 Calcitriol 1 mcg Sensipar 180 mg L AVF Not recently to EDW (closest 0.8 kg)  Assessment/Recommendations  1. ESRD - MWF [facility]. Missed HD. Needs urgent for CHF/hyperkalemia, HD again tomorrow 2. Hyperkalemia - s/p albuterol, D50, insulin. Needs urgent HD. 1K bath.  3. CHF/volume overload - HD 4. Secondary HPT - sensipar 180/calcitriol 1 mcg po every HD  Jamal Maes,  MD [facility] [phone] pager 10/08/2017, 6:09 PM

## 2017-10-08 NOTE — Procedures (Signed)
I have personally attended this patient's dialysis session.   Pre HD weight 95.2 (EDW 92.5) L AVF no issues 1K bath for duration of TMT 2/2 K 7.4  Jamal Maes, MD [facility] [phone] Pager 10/08/2017, 7:28 PM

## 2017-10-08 NOTE — ED Triage Notes (Addendum)
Onset yesterday chest pain and shortness of breath.  C/o nausea, vomited x 1.  Last had dialysis 10-04-17,

## 2017-10-09 ENCOUNTER — Other Ambulatory Visit: Payer: Self-pay

## 2017-10-09 LAB — CBC
HCT: 31.1 % — ABNORMAL LOW (ref 39.0–52.0)
Hemoglobin: 10.2 g/dL — ABNORMAL LOW (ref 13.0–17.0)
MCH: 28.6 pg (ref 26.0–34.0)
MCHC: 32.8 g/dL (ref 30.0–36.0)
MCV: 87.1 fL (ref 78.0–100.0)
Platelets: 130 10*3/uL — ABNORMAL LOW (ref 150–400)
RBC: 3.57 MIL/uL — ABNORMAL LOW (ref 4.22–5.81)
RDW: 15.5 % (ref 11.5–15.5)
WBC: 6 10*3/uL (ref 4.0–10.5)

## 2017-10-09 LAB — COMPREHENSIVE METABOLIC PANEL
ALT: 11 U/L — ABNORMAL LOW (ref 17–63)
AST: 12 U/L — ABNORMAL LOW (ref 15–41)
Albumin: 3.4 g/dL — ABNORMAL LOW (ref 3.5–5.0)
Alkaline Phosphatase: 51 U/L (ref 38–126)
Anion gap: 10 (ref 5–15)
BUN: 24 mg/dL — ABNORMAL HIGH (ref 6–20)
CO2: 27 mmol/L (ref 22–32)
Calcium: 8.8 mg/dL — ABNORMAL LOW (ref 8.9–10.3)
Chloride: 97 mmol/L — ABNORMAL LOW (ref 101–111)
Creatinine, Ser: 7.98 mg/dL — ABNORMAL HIGH (ref 0.61–1.24)
GFR calc Af Amer: 8 mL/min — ABNORMAL LOW (ref >60)
GFR calc non Af Amer: 7 mL/min — ABNORMAL LOW (ref >60)
Glucose, Bld: 95 mg/dL (ref 65–99)
Potassium: 3.8 mmol/L (ref 3.5–5.1)
Sodium: 134 mmol/L — ABNORMAL LOW (ref 135–145)
Total Bilirubin: 0.9 mg/dL (ref 0.3–1.2)
Total Protein: 7.2 g/dL (ref 6.5–8.1)

## 2017-10-09 LAB — GLUCOSE, CAPILLARY
Glucose-Capillary: 115 mg/dL — ABNORMAL HIGH (ref 65–99)
Glucose-Capillary: 48 mg/dL — ABNORMAL LOW (ref 65–99)
Glucose-Capillary: 59 mg/dL — ABNORMAL LOW (ref 65–99)
Glucose-Capillary: 77 mg/dL (ref 65–99)

## 2017-10-09 LAB — RENAL FUNCTION PANEL
Albumin: 3.3 g/dL — ABNORMAL LOW (ref 3.5–5.0)
Anion gap: 10 (ref 5–15)
BUN: 24 mg/dL — ABNORMAL HIGH (ref 6–20)
CO2: 28 mmol/L (ref 22–32)
Calcium: 8.8 mg/dL — ABNORMAL LOW (ref 8.9–10.3)
Chloride: 96 mmol/L — ABNORMAL LOW (ref 101–111)
Creatinine, Ser: 7.84 mg/dL — ABNORMAL HIGH (ref 0.61–1.24)
GFR calc Af Amer: 8 mL/min — ABNORMAL LOW (ref >60)
GFR calc non Af Amer: 7 mL/min — ABNORMAL LOW (ref >60)
Glucose, Bld: 94 mg/dL (ref 65–99)
Phosphorus: 4.6 mg/dL (ref 2.5–4.6)
Potassium: 3.8 mmol/L (ref 3.5–5.1)
Sodium: 134 mmol/L — ABNORMAL LOW (ref 135–145)

## 2017-10-09 LAB — MRSA PCR SCREENING: MRSA by PCR: NEGATIVE

## 2017-10-09 LAB — TROPONIN I: Troponin I: <0.03 ng/mL (ref <0.03)

## 2017-10-09 MED ORDER — CARVEDILOL 25 MG PO TABS
25.0000 mg | ORAL_TABLET | Freq: Every day | ORAL | Status: DC
  Administered 2017-10-09 (×2): 25 mg via ORAL
  Filled 2017-10-09 (×2): qty 1

## 2017-10-09 MED ORDER — ONDANSETRON HCL 4 MG PO TABS: 4.0000 mg | ORAL_TABLET | Freq: Four times a day (QID) | ORAL | Status: DC | PRN

## 2017-10-09 MED ORDER — HEPARIN SODIUM (PORCINE) 5000 UNIT/ML IJ SOLN
5000.0000 [IU] | Freq: Three times a day (TID) | INTRAMUSCULAR | Status: DC
  Administered 2017-10-09: 5000 [IU] via SUBCUTANEOUS
  Filled 2017-10-09: qty 1

## 2017-10-09 MED ORDER — AMLODIPINE BESYLATE 10 MG PO TABS
10.0000 mg | ORAL_TABLET | Freq: Every day | ORAL | Status: DC
  Administered 2017-10-09 (×2): 10 mg via ORAL
  Filled 2017-10-09 (×2): qty 1

## 2017-10-09 MED ORDER — CARVEDILOL 25 MG PO TABS: 25.0000 mg | ORAL_TABLET | ORAL | 1 refills | Status: DC

## 2017-10-09 MED ORDER — ASPIRIN 81 MG PO CHEW
81.0000 mg | CHEWABLE_TABLET | Freq: Every day | ORAL | Status: DC
  Administered 2017-10-09 (×2): 81 mg via ORAL
  Filled 2017-10-09 (×2): qty 1

## 2017-10-09 MED ORDER — ORAL CARE MOUTH RINSE
15.0000 mL | Freq: Two times a day (BID) | OROMUCOSAL | Status: DC
  Administered 2017-10-09: 15 mL via OROMUCOSAL

## 2017-10-09 MED ORDER — ATORVASTATIN CALCIUM 20 MG PO TABS
20.0000 mg | ORAL_TABLET | Freq: Every day | ORAL | Status: DC
  Administered 2017-10-09 (×2): 20 mg via ORAL
  Filled 2017-10-09 (×2): qty 1

## 2017-10-09 MED ORDER — SENNOSIDES-DOCUSATE SODIUM 8.6-50 MG PO TABS: 1.0000 | ORAL_TABLET | Freq: Every evening | ORAL | Status: DC | PRN

## 2017-10-09 MED ORDER — RENA-VITE PO TABS
1.0000 | ORAL_TABLET | Freq: Every day | ORAL | Status: DC
  Administered 2017-10-09 (×2): 1 via ORAL
  Filled 2017-10-09 (×2): qty 1

## 2017-10-09 MED ORDER — CINACALCET HCL 30 MG PO TABS
180.0000 mg | ORAL_TABLET | ORAL | Status: DC
  Administered 2017-10-09: 180 mg via ORAL
  Filled 2017-10-09: qty 6

## 2017-10-09 MED ORDER — CARVEDILOL 25 MG PO TABS: 25.0000 mg | ORAL_TABLET | ORAL | Status: DC

## 2017-10-09 MED ORDER — SEVELAMER CARBONATE 800 MG PO TABS
3200.0000 mg | ORAL_TABLET | Freq: Three times a day (TID) | ORAL | Status: DC
  Administered 2017-10-09 (×2): 3200 mg via ORAL
  Filled 2017-10-09 (×2): qty 4

## 2017-10-09 MED ORDER — CALCITRIOL 0.5 MCG PO CAPS
ORAL_CAPSULE | ORAL | Status: AC
  Administered 2017-10-09: 1 ug via ORAL
  Filled 2017-10-09: qty 2

## 2017-10-09 MED ORDER — ACETAMINOPHEN 325 MG PO TABS: 650.0000 mg | ORAL_TABLET | Freq: Four times a day (QID) | ORAL | Status: DC | PRN

## 2017-10-09 MED ORDER — ALBUTEROL SULFATE (2.5 MG/3ML) 0.083% IN NEBU: 2.5000 mg | INHALATION_SOLUTION | Freq: Four times a day (QID) | RESPIRATORY_TRACT | Status: DC | PRN

## 2017-10-09 MED ORDER — ACETAMINOPHEN 650 MG RE SUPP: 650.0000 mg | Freq: Four times a day (QID) | RECTAL | Status: DC | PRN

## 2017-10-09 MED ORDER — SEVELAMER CARBONATE 800 MG PO TABS: 2400.0000 mg | ORAL_TABLET | ORAL | Status: DC | PRN

## 2017-10-09 MED ORDER — ONDANSETRON HCL 4 MG/2ML IJ SOLN: 4.0000 mg | Freq: Four times a day (QID) | INTRAMUSCULAR | Status: DC | PRN

## 2017-10-09 NOTE — Care Management Note (Signed)
Case Management Note  Patient Details  Name: [name] [name] MRN: [id number] Date of Birth: 07-26-1956  Subjective/Objective:                    Action/Plan: Pt discharging home with self care. Pt has PCP, insurance and transportation home. No further needs per CM.  Expected Discharge Date:  10/09/17               Expected Discharge Plan:  Home/Self Care  In-House Referral:     Discharge planning Services     Post Acute Care Choice:    Choice offered to:     DME Arranged:    DME Agency:     HH Arranged:    HH Agency:     Status of Service:  Completed, signed off  If discussed at [employer] of Stay Meetings, dates discussed:    Additional Comments:  Pollie Friar, RN 10/09/2017, 4:45 PM

## 2017-10-09 NOTE — Progress Notes (Signed)
CRITICAL VALUE ALERT  Critical Value:  CBG 48  Date & Time Notied:  10/09/17 at 0052--Gave a meal, rechecked CBG at 0111 and CBG was 59. Gave patient a soda and rechecked CBG and it was 115.  Provider Notified: Internal Medicine Teaching Services  Orders Received/Actions taken: No orders obtained from MD. Will continue to monitor and treat per MD orders.

## 2017-10-09 NOTE — Progress Notes (Addendum)
HD tx completed @ 2339 w/ cramping issues throughout later half of tx requiring UF off for approximately 50 mins, UF goal not met, blood rinsed back, VSS, report called to primary nurse 

## 2017-10-09 NOTE — Progress Notes (Signed)
   Subjective: [name] [name] was seen doing well this morning. He was resting comfortably and was in the process of going to get hemodialysis.   Objective:  Vital signs in last 24 hours: Vitals:   10/09/17 0509 10/09/17 1415 10/09/17 1430 10/09/17 1435  BP: 123/71 (!) 170/95 (!) 160/87 (!) 160/84  Pulse: 88 87 92 84  Resp: 16 (!) 25    Temp: 98.3 F (36.8 C) 98.3 F (36.8 C)    TempSrc:  Oral    SpO2: 91% 99%    Weight:  205 lb 11 oz (93.3 kg)    Height:       Physical Exam  Constitutional: He appears well-developed and well-nourished. No distress.  HENT:  Head: Normocephalic and atraumatic.  Eyes: Conjunctivae are normal.  Cardiovascular: Normal rate, regular rhythm, normal heart sounds and intact distal pulses.  No murmur heard. Respiratory: Effort normal and breath sounds normal. No respiratory distress. He has no wheezes.  GI: Soft. Bowel sounds are normal. He exhibits no distension. There is no tenderness.  Musculoskeletal: He exhibits no edema.  Neurological: He is alert.  Skin: He is not diaphoretic.  Psychiatric: He has a normal mood and affect. His behavior is normal. Judgment and thought content normal.    Assessment/Plan:  Pleuritic Chest pain Hyperkalemia The patient presented with pleuritic chest pain that lasted 12-18hrs. The patient did not have any st or t wave changes and troponin has been negative thusfar. The patient had missed a dialysis session and presented with k=7.4, bun=71, and cr=16.24. The patient's chest pain is likely due to the electrolyte abnormlaities as a result of missing dialysis.   -The patient was dialyzed emergently yesterday and again per regular schedule -The patient was discharged post dialysis as he was comfortable and noted that all his symptoms had resolved.   Hyperkalemia The patient presented with potassium of 7.4 and peaked t waves noted on ekg. The patient's k=3.8 today 10/09/16.  -the patient was dialyzed, biven insulin aspart  10u, calcium gluconate -Placed on cardiac monitoring.  HFpEF The patient's last Echo showed EF 45-50%, G2DD, mild lvh  -continue carvedilol 25mg  qhs and an additional tablet t,th, sa,su -continue atorvastatin 20mg   ESRD  The patient gets HD MWF. The patient got dialyzed emergently for K=7.4 and repeat dialysis session in the afternoon.  -will discharge the patient and recommend continuing regular dialysis schedule  Hypertension The patient's blood pressure over the past 24 hrs was 160-170/84-95.  -continue carvedilol 25mg  qhs and an additional tablet t,th, sa,su  Dispo: Anticipated discharge today.   Lars Mage, MD Internal Medicine PGY1 [phone] 10/09/2017, 4:40 PM

## 2017-10-09 NOTE — Progress Notes (Signed)
Patient admitted from ED A&O x4. Pt was transferred from ED to emergency HD to his assigned room of 5W09. Patient VS stable with CBG of 48. CBG treated and is now 115--MD notified. Patient oriented to room and instructed on how to use the callbell before getting out of bed to use the bathroom. Patient has no complaints of pain. Will continue to monitor and treat per MD orders.

## 2017-10-09 NOTE — Progress Notes (Addendum)
Discharge  Patient returned from hemodialysis, left arm fistula dressing remains intact. Alert/oriented x4, denies pain, denies dizziness. BP remains elevated as when admitted 160s, given norvasc and coreg as scheduled. Patient given discharge education. PIV removed and d/c'd telemetry. Patient care plan and education resolved. Wheeled out in wheelchair by staff and accompanied by visitor.

## 2017-10-09 NOTE — Discharge Instructions (Signed)
It was a pleasure to take care of you [name] [name]. During this hospitalization you were dialyzed in order to control your laboratory abnormalities and control your chest pain. Please continue to go to all your dialysis appointments per your regular schedule.

## 2017-10-09 NOTE — Progress Notes (Signed)
[facility] Progress Note   Subjective:  Seen in room today. Says breathing much improved, off oxygen. Denies CP or abdominal pain. S/p HD yesterday for high K and volume.   Objective Vitals:   10/09/17 0006 10/09/17 0113 10/09/17 0114 10/09/17 0509  BP: (!) 156/82 (!) 178/79 (!) 169/83 123/71  Pulse: 88 92 88 88  Resp: 20 18  16   Temp: 97.7 F (36.5 C) 98.2 F (36.8 C)  98.3 F (36.8 C)  TempSrc: Oral Oral    SpO2: 98% 97%  91%  Weight: 93.8 kg (206 lb 12.7 oz) 93.1 kg (205 lb 4.8 oz)    Height:  6' (1.829 m)     Physical Exam General: Well appearing male, NAD. On room air. Heart: RRR; no murmur. Lungs: CTAB Extremities: No LE edema Dialysis Access: LUE AVF + thrill  Additional Objective Labs: Basic Metabolic Panel: Recent Labs  Lab 10/08/17 1601 10/09/17 0323  NA 137 134*  134*  K 7.4* 3.8  3.8  CL 95* 97*  96*  CO2 26 27  28   GLUCOSE 92 95  94  BUN 71* 24*  24*  CREATININE 16.24* 7.98*  7.84*  CALCIUM 8.9 8.8*  8.8*  PHOS  --  4.6   Liver Function Tests: Recent Labs  Lab 10/09/17 0323  AST 12*  ALT 11*  ALKPHOS 51  BILITOT 0.9  PROT 7.2  ALBUMIN 3.4*  3.3*   CBC: Recent Labs  Lab 10/08/17 1601 10/09/17 0323  WBC 8.5 6.0  HGB 11.3* 10.2*  HCT 35.5* 31.1*  MCV 87.7 87.1  PLT 158 130*   Studies/Results: Dg Chest 2 View  Result Date: 10/08/2017 CLINICAL DATA:  Chest pain with dyspnea since yesterday. EXAM: CHEST  2 VIEW COMPARISON:  06/06/2017 FINDINGS: Re- demonstration of cardiomegaly with pulmonary vascular redistribution consistent with CHF. Central pulmonary vascular engorgement since prior. Superimposed adenopathy is not entirely excluded but findings are more likely related to CHF and pulmonary vascular congestion. No significant pleural effusion or pneumothorax. No pneumonic consolidations. IMPRESSION: Cardiomegaly with interval development of CHF. Electronically Signed   By: Ashley Royalty M.D.   On: 10/08/2017 16:35    Medications:  . albuterol  10 mg Nebulization Once  . amLODipine  10 mg Oral QHS  . aspirin  81 mg Oral QHS  . atorvastatin  20 mg Oral QHS  . calcitRIOL  1 mcg Oral Q M,W,F-HD  . carvedilol  25 mg Oral QHS  . [START ON 10/10/2017] carvedilol  25 mg Oral Once per day on Sun Tue Thu Sat  . cinacalcet  180 mg Oral Once per day on Mon Wed Fri  . heparin  5,000 Units Subcutaneous Q8H  . mouth rinse  15 mL Mouth Rinse BID  . multivitamin  1 tablet Oral QHS  . sevelamer carbonate  3,200 mg Oral TID WC   Dialysis Orders: MWF [facility] 4 hours, 500/800, EDW 92.5 kg, 2K2Ca Heparin 8000, L AVF Calcitriol 1 mcg Sensipar 180 mg Not recently to EDW (closest 0.8 kg)  Assessment/Plan: 1. Hyperkalemia: S/p urgent HD 10/08/17 for K 7.4, resolved. 2. ESRD: Back to MWF schedule now, for HD again today. 3. HTN/volume: BP improved. He feels that his EDW should be 93kg. Will get there today and see how he feels, likely adjust on d/c. 4. Anemia: Hgb 10.2. No ESA today. 5. Secondary hyperparathyroidism: Ca/Phos at goal. Continue Renvela/VDRA/sensipar. 6. Type 2 DM: Per primary.  Veneta Penton, PA-C 10/09/2017, 10:24 AM  [facility] Pager: [phone]  I have seen and examined this patient and agree with plan and assessment in the above note with renal recommendations/intervention highlighted.  Stressed the importance of compliance with HD. Broadus John A Herbert Aguinaldo,MD 10/09/2017 3:21 PM

## 2017-10-10 ENCOUNTER — Encounter (HOSPITAL_COMMUNITY): Payer: Self-pay

## 2017-10-10 ENCOUNTER — Emergency Department (HOSPITAL_COMMUNITY)
Admission: EM | Admit: 2017-10-10 | Discharge: 2017-10-10 | Disposition: A | Discharge status: Home / Self Care | Payer: Medicare Other | Attending: Emergency Medicine | Admitting: Emergency Medicine

## 2017-10-10 ENCOUNTER — Ambulatory Visit: Payer: Medicare Other

## 2017-10-10 DIAGNOSIS — R079 Chest pain, unspecified: Secondary | ICD-10-CM | POA: Insufficient documentation

## 2017-10-10 DIAGNOSIS — Z5321 Procedure and treatment not carried out due to patient leaving prior to being seen by health care provider: Secondary | ICD-10-CM | POA: Insufficient documentation

## 2017-10-10 LAB — BASIC METABOLIC PANEL
ANION GAP: 9 (ref 5–15)
BUN: 24 mg/dL — ABNORMAL HIGH (ref 6–20)
CO2: 30 mmol/L (ref 22–32)
CREATININE: 7.48 mg/dL — AB (ref 0.61–1.24)
Calcium: 8.9 mg/dL (ref 8.9–10.3)
Chloride: 99 mmol/L — ABNORMAL LOW (ref 101–111)
GFR calc non Af Amer: 7 mL/min — ABNORMAL LOW (ref >60)
GFR, EST AFRICAN AMERICAN: 8 mL/min — AB (ref >60)
Glucose, Bld: 104 mg/dL — ABNORMAL HIGH (ref 65–99)
Potassium: 4.7 mmol/L (ref 3.5–5.1)
SODIUM: 138 mmol/L (ref 135–145)

## 2017-10-10 LAB — CBC
HCT: 34.5 % — ABNORMAL LOW (ref 39.0–52.0)
HEMOGLOBIN: 10.8 g/dL — AB (ref 13.0–17.0)
MCH: 27.7 pg (ref 26.0–34.0)
MCHC: 31.3 g/dL (ref 30.0–36.0)
MCV: 88.5 fL (ref 78.0–100.0)
PLATELETS: 144 10*3/uL — AB (ref 150–400)
RBC: 3.9 MIL/uL — AB (ref 4.22–5.81)
RDW: 15.3 % (ref 11.5–15.5)
WBC: 5.4 10*3/uL (ref 4.0–10.5)

## 2017-10-10 LAB — I-STAT TROPONIN, ED
TROPONIN I, POC: 0.01 ng/mL (ref 0.00–0.08)
Troponin i, poc: 0.02 ng/mL (ref 0.00–0.08)

## 2017-10-10 NOTE — Discharge Summary (Signed)
Name: [name] [name] MRN: [id number] DOB: December 31, 1955 62 y.o. PCP: Neva Seat, MD  Date of Admission: 10/10/2017  7:57 PM Date of Discharge: 10/10/2017 Attending Physician: Dr. Rebeca Alert  Discharge Diagnosis:  Pleuritic chest pain secondary to missed dialysis  Discharge Medications: Allergies as of 10/10/2017      Reactions   Enalapril Hives, Rash   Latex Rash   Tape Rash   TAPE MAKES SKIN BREAK OUT AND TURN RED      Medication List    ASK your doctor about these medications   albuterol 108 (90 Base) MCG/ACT inhaler Commonly known as:  PROVENTIL HFA;VENTOLIN HFA Inhale 1-2 puffs into the lungs every 6 (six) hours as needed for wheezing or shortness of breath.   amLODipine 10 MG tablet Commonly known as:  NORVASC Take 10 mg by mouth at bedtime.   aspirin 81 MG chewable tablet Chew 1 tablet (81 mg total) by mouth daily.   atorvastatin 20 MG tablet Commonly known as:  LIPITOR Take 20 mg by mouth at bedtime. Reported on 03/14/2016   carvedilol 25 MG tablet Commonly known as:  COREG Take 1 tablet (25 mg total) by mouth See admin instructions. Take 1 tablet (25 mg) by mouth daily at bedtime, take 1 tablet (25 mg) on Sunday, Tuesday, Thursday, Saturday mornings   cinacalcet 90 MG tablet Commonly known as:  SENSIPAR Take 180 mg by mouth every Monday, Wednesday, and Friday with hemodialysis.   multivitamin Tabs tablet Take 1 tablet by mouth at bedtime.   nitroGLYCERIN 0.4 MG SL tablet Commonly known as:  NITROSTAT Place 0.4 mg under the tongue every 5 (five) minutes as needed for chest pain.   sevelamer carbonate 800 MG tablet Commonly known as:  RENVELA Take 4 tablets (3,200 mg total) by mouth 3 (three) times daily with meals.       Disposition and follow-up:   [name] [name] was discharged from [facility] in stable condition.  At the hospital follow up visit please address:  1.  Pleuritic chest pain: please ensure that the patient does not have  chest pain, is compliant with dialysis schedule  2.  Labs / imaging needed at time of follow-up: bmp to check potassium  3.  Pending labs/ test needing follow-up: none  Follow-up Appointments:   Hospital Course by problem list:  Pleuritic Chest Pain secondary to missing dialysis session [name] [name] presented with pleuritic chest pain that lasted 12-18hrs, weakness, shortness of breath, and a potassium of 7.4 post missing her dialysis session due to lack of transportation. The patient stated that he felt this way prior when he had missed dialysis sessions. There were no st or t wave changes noted on ekg and negative troponin was present. Chest xray showed pulmonary edema. The patient was emergently dialyzed on admission and then again later in the evening per his regular dialysis MWF schedule. The patient was discharged post his dialysis session and he mentioned that his symptoms resolved.   Hyperkalemia The patient presented with a potassium level of 7.4 and peaked t waves on ekg. The patient was dialyzed, given insulin, calcium gluconate, and monitored with telemetry.   Discharge Vitals:   BP (!) 157/78 (BP Location: Right Arm)   Pulse 83   Temp 98.5 F (36.9 C) (Oral)   Resp 16   Ht 6' (1.829 m)   Wt 205 lb (93 kg)   SpO2 97%   BMI 27.80 kg/m   Pertinent Labs, Studies, and Procedures:   EKG (  10/08/17): no st or t wave abnormality  Chest xray (1/1/9): cardiomegaly with interval development of chf   BMP Latest Ref Rng & Units 10/10/2017 10/09/2017 10/09/2017  Glucose 65 - 99 mg/dL 104(H) 95 94  BUN 6 - 20 mg/dL 24(H) 24(H) 24(H)  Creatinine 0.61 - 1.24 mg/dL 7.48(H) 7.98(H) 7.84(H)  Sodium 135 - 145 mmol/L 138 134(L) 134(L)  Potassium 3.5 - 5.1 mmol/L 4.7 3.8 3.8  Chloride 101 - 111 mmol/L 99(L) 97(L) 96(L)  CO2 22 - 32 mmol/L 30 27 28   Calcium 8.9 - 10.3 mg/dL 8.9 8.8(L) 8.8(L)   CBC    Component Value Date/Time   WBC 5.4 10/10/2017 1317   RBC 3.90 (L) 10/10/2017 1317   HGB  10.8 (L) 10/10/2017 1317   HCT 34.5 (L) 10/10/2017 1317   PLT 144 (L) 10/10/2017 1317   MCV 88.5 10/10/2017 1317   MCH 27.7 10/10/2017 1317   MCHC 31.3 10/10/2017 1317   RDW 15.3 10/10/2017 1317   LYMPHSABS 0.9 06/02/2017 2031   MONOABS 0.6 06/02/2017 2031   EOSABS 0.1 06/02/2017 2031   BASOSABS 0.0 06/02/2017 2031   CMP     Component Value Date/Time   NA 138 10/10/2017 1317   K 4.7 10/10/2017 1317   CL 99 (L) 10/10/2017 1317   CO2 30 10/10/2017 1317   GLUCOSE 104 (H) 10/10/2017 1317   BUN 24 (H) 10/10/2017 1317   CREATININE 7.48 (H) 10/10/2017 1317   CALCIUM 8.9 10/10/2017 1317   PROT 7.2 10/09/2017 0323   ALBUMIN 3.3 (L) 10/09/2017 0323   ALBUMIN 3.4 (L) 10/09/2017 0323   AST 12 (L) 10/09/2017 0323   ALT 11 (L) 10/09/2017 0323   ALKPHOS 51 10/09/2017 0323   BILITOT 0.9 10/09/2017 0323   GFRNONAA 7 (L) 10/10/2017 1317   GFRAA 8 (L) 10/10/2017 1317    Discharge Instructions:   Signed: Lars Mage, MD 10/11/2017, 5:29 PM   Pager: [phone]

## 2017-10-10 NOTE — Discharge Summary (Deleted)
  The note originally documented on this encounter has been moved the the encounter in which it belongs.  

## 2017-10-10 NOTE — ED Triage Notes (Signed)
Per Pt, Pt is coming from home with complaints of CP and SOB that started three days ago. Pt was seen here yesterday and discharged after dialysis. Pt reports insomnia due to SOB.

## 2017-10-10 NOTE — ED Notes (Signed)
Pt has decided to leave AMA. 

## 2017-10-11 DIAGNOSIS — R918 Other nonspecific abnormal finding of lung field: Secondary | ICD-10-CM | POA: Diagnosis not present

## 2017-10-11 DIAGNOSIS — R0600 Dyspnea, unspecified: Secondary | ICD-10-CM | POA: Diagnosis not present

## 2017-10-11 DIAGNOSIS — I120 Hypertensive chronic kidney disease with stage 5 chronic kidney disease or end stage renal disease: Secondary | ICD-10-CM | POA: Diagnosis not present

## 2017-10-11 DIAGNOSIS — N2581 Secondary hyperparathyroidism of renal origin: Secondary | ICD-10-CM | POA: Diagnosis not present

## 2017-10-11 DIAGNOSIS — Z720 Tobacco use: Secondary | ICD-10-CM | POA: Diagnosis not present

## 2017-10-11 DIAGNOSIS — D509 Iron deficiency anemia, unspecified: Secondary | ICD-10-CM | POA: Diagnosis not present

## 2017-10-11 DIAGNOSIS — Z794 Long term (current) use of insulin: Secondary | ICD-10-CM | POA: Diagnosis not present

## 2017-10-11 DIAGNOSIS — R079 Chest pain, unspecified: Secondary | ICD-10-CM | POA: Diagnosis not present

## 2017-10-11 DIAGNOSIS — I2510 Atherosclerotic heart disease of native coronary artery without angina pectoris: Secondary | ICD-10-CM | POA: Diagnosis not present

## 2017-10-11 DIAGNOSIS — E8770 Fluid overload, unspecified: Secondary | ICD-10-CM | POA: Diagnosis not present

## 2017-10-11 DIAGNOSIS — D631 Anemia in chronic kidney disease: Secondary | ICD-10-CM | POA: Diagnosis not present

## 2017-10-11 DIAGNOSIS — E1129 Type 2 diabetes mellitus with other diabetic kidney complication: Secondary | ICD-10-CM | POA: Diagnosis not present

## 2017-10-11 DIAGNOSIS — Z992 Dependence on renal dialysis: Secondary | ICD-10-CM | POA: Diagnosis not present

## 2017-10-11 DIAGNOSIS — N186 End stage renal disease: Secondary | ICD-10-CM | POA: Diagnosis not present

## 2017-10-11 DIAGNOSIS — E1122 Type 2 diabetes mellitus with diabetic chronic kidney disease: Secondary | ICD-10-CM | POA: Diagnosis not present

## 2017-10-12 DIAGNOSIS — E8770 Fluid overload, unspecified: Secondary | ICD-10-CM | POA: Diagnosis not present

## 2017-10-12 DIAGNOSIS — Z794 Long term (current) use of insulin: Secondary | ICD-10-CM | POA: Diagnosis not present

## 2017-10-12 DIAGNOSIS — R0989 Other specified symptoms and signs involving the circulatory and respiratory systems: Secondary | ICD-10-CM | POA: Diagnosis not present

## 2017-10-12 DIAGNOSIS — I517 Cardiomegaly: Secondary | ICD-10-CM | POA: Diagnosis not present

## 2017-10-12 DIAGNOSIS — I2510 Atherosclerotic heart disease of native coronary artery without angina pectoris: Secondary | ICD-10-CM | POA: Diagnosis not present

## 2017-10-12 DIAGNOSIS — E1140 Type 2 diabetes mellitus with diabetic neuropathy, unspecified: Secondary | ICD-10-CM | POA: Diagnosis not present

## 2017-10-12 DIAGNOSIS — R7989 Other specified abnormal findings of blood chemistry: Secondary | ICD-10-CM | POA: Diagnosis not present

## 2017-10-12 DIAGNOSIS — R079 Chest pain, unspecified: Secondary | ICD-10-CM | POA: Diagnosis not present

## 2017-10-12 DIAGNOSIS — I120 Hypertensive chronic kidney disease with stage 5 chronic kidney disease or end stage renal disease: Secondary | ICD-10-CM | POA: Diagnosis not present

## 2017-10-12 DIAGNOSIS — E0821 Diabetes mellitus due to underlying condition with diabetic nephropathy: Secondary | ICD-10-CM | POA: Diagnosis not present

## 2017-10-12 DIAGNOSIS — R918 Other nonspecific abnormal finding of lung field: Secondary | ICD-10-CM | POA: Diagnosis not present

## 2017-10-12 DIAGNOSIS — R0600 Dyspnea, unspecified: Secondary | ICD-10-CM | POA: Diagnosis not present

## 2017-10-12 DIAGNOSIS — J90 Pleural effusion, not elsewhere classified: Secondary | ICD-10-CM | POA: Diagnosis not present

## 2017-10-12 DIAGNOSIS — I150 Renovascular hypertension: Secondary | ICD-10-CM | POA: Diagnosis not present

## 2017-10-12 DIAGNOSIS — R748 Abnormal levels of other serum enzymes: Secondary | ICD-10-CM | POA: Diagnosis not present

## 2017-10-12 DIAGNOSIS — E1122 Type 2 diabetes mellitus with diabetic chronic kidney disease: Secondary | ICD-10-CM | POA: Diagnosis not present

## 2017-10-12 DIAGNOSIS — Z720 Tobacco use: Secondary | ICD-10-CM | POA: Diagnosis not present

## 2017-10-12 DIAGNOSIS — I509 Heart failure, unspecified: Secondary | ICD-10-CM | POA: Diagnosis not present

## 2017-10-12 DIAGNOSIS — N186 End stage renal disease: Secondary | ICD-10-CM | POA: Diagnosis not present

## 2017-10-12 DIAGNOSIS — I10 Essential (primary) hypertension: Secondary | ICD-10-CM | POA: Diagnosis not present

## 2017-10-12 DIAGNOSIS — Z79899 Other long term (current) drug therapy: Secondary | ICD-10-CM | POA: Diagnosis not present

## 2017-10-12 DIAGNOSIS — Z992 Dependence on renal dialysis: Secondary | ICD-10-CM | POA: Diagnosis not present

## 2017-10-13 DIAGNOSIS — R079 Chest pain, unspecified: Secondary | ICD-10-CM | POA: Diagnosis not present

## 2017-10-13 DIAGNOSIS — I150 Renovascular hypertension: Secondary | ICD-10-CM | POA: Diagnosis not present

## 2017-10-13 DIAGNOSIS — E8770 Fluid overload, unspecified: Secondary | ICD-10-CM | POA: Diagnosis not present

## 2017-10-14 ENCOUNTER — Telehealth: Payer: Self-pay

## 2017-10-14 DIAGNOSIS — N2581 Secondary hyperparathyroidism of renal origin: Secondary | ICD-10-CM | POA: Diagnosis not present

## 2017-10-14 DIAGNOSIS — D509 Iron deficiency anemia, unspecified: Secondary | ICD-10-CM | POA: Diagnosis not present

## 2017-10-14 DIAGNOSIS — D631 Anemia in chronic kidney disease: Secondary | ICD-10-CM | POA: Diagnosis not present

## 2017-10-14 DIAGNOSIS — E1129 Type 2 diabetes mellitus with other diabetic kidney complication: Secondary | ICD-10-CM | POA: Diagnosis not present

## 2017-10-14 DIAGNOSIS — N186 End stage renal disease: Secondary | ICD-10-CM | POA: Diagnosis not present

## 2017-10-14 NOTE — Telephone Encounter (Signed)
TOC per Dr Maricela Bo appt 10/11/2017.

## 2017-10-16 DIAGNOSIS — N186 End stage renal disease: Secondary | ICD-10-CM | POA: Diagnosis not present

## 2017-10-16 DIAGNOSIS — D631 Anemia in chronic kidney disease: Secondary | ICD-10-CM | POA: Diagnosis not present

## 2017-10-16 DIAGNOSIS — D509 Iron deficiency anemia, unspecified: Secondary | ICD-10-CM | POA: Diagnosis not present

## 2017-10-16 DIAGNOSIS — N2581 Secondary hyperparathyroidism of renal origin: Secondary | ICD-10-CM | POA: Diagnosis not present

## 2017-10-16 DIAGNOSIS — E1129 Type 2 diabetes mellitus with other diabetic kidney complication: Secondary | ICD-10-CM | POA: Diagnosis not present

## 2017-10-16 NOTE — Telephone Encounter (Signed)
Transition Care Management Follow-up Telephone Call   Date discharged: 10/10/2017   How have you been since you were released from the hospital? Feels better now, but stated he had to return back the hospital after he was discharged due to shortness of breath.    Do you understand why you were in the hospital? yes   Do you understand the discharge instructions? yes   Where were you discharged to? Home   Items Reviewed:  Medications reviewed: yes  Allergies reviewed: yes  Dietary changes reviewed: no  Referrals reviewed: N/A   Functional Questionnaire:   Activities of Daily Living (ADLs):   He states they are independent in the following: ambulation, bathing and hygiene, feeding, continence, grooming, toileting and dressing States they require assistance with the following: none   Any transportation issues/concerns?: no-not at this time   Any patient concerns?HFU appt scheduled on Monday 01/14 and has dialysis-appt changed to Tues 1/15   Confirmed importance and date/time of follow-up visits scheduled Yes  Provider Appointment booked with [facility] for HFU on 10/22/2017 @ 10:15am  Confirmed with patient if condition begins to worsen call PCP or go to the ER.  Patient was given the office number and encouraged to call back with question or concerns.  : yes

## 2017-10-18 DIAGNOSIS — E1129 Type 2 diabetes mellitus with other diabetic kidney complication: Secondary | ICD-10-CM | POA: Diagnosis not present

## 2017-10-18 DIAGNOSIS — D631 Anemia in chronic kidney disease: Secondary | ICD-10-CM | POA: Diagnosis not present

## 2017-10-18 DIAGNOSIS — D509 Iron deficiency anemia, unspecified: Secondary | ICD-10-CM | POA: Diagnosis not present

## 2017-10-18 DIAGNOSIS — N186 End stage renal disease: Secondary | ICD-10-CM | POA: Diagnosis not present

## 2017-10-18 DIAGNOSIS — N2581 Secondary hyperparathyroidism of renal origin: Secondary | ICD-10-CM | POA: Diagnosis not present

## 2017-10-21 ENCOUNTER — Ambulatory Visit: Payer: Medicare Other

## 2017-10-21 DIAGNOSIS — D509 Iron deficiency anemia, unspecified: Secondary | ICD-10-CM | POA: Diagnosis not present

## 2017-10-21 DIAGNOSIS — D631 Anemia in chronic kidney disease: Secondary | ICD-10-CM | POA: Diagnosis not present

## 2017-10-21 DIAGNOSIS — N2581 Secondary hyperparathyroidism of renal origin: Secondary | ICD-10-CM | POA: Diagnosis not present

## 2017-10-21 DIAGNOSIS — E1129 Type 2 diabetes mellitus with other diabetic kidney complication: Secondary | ICD-10-CM | POA: Diagnosis not present

## 2017-10-21 DIAGNOSIS — N186 End stage renal disease: Secondary | ICD-10-CM | POA: Diagnosis not present

## 2017-10-22 ENCOUNTER — Ambulatory Visit: Payer: Medicare Other

## 2017-10-23 DIAGNOSIS — D631 Anemia in chronic kidney disease: Secondary | ICD-10-CM | POA: Diagnosis not present

## 2017-10-23 DIAGNOSIS — N2581 Secondary hyperparathyroidism of renal origin: Secondary | ICD-10-CM | POA: Diagnosis not present

## 2017-10-23 DIAGNOSIS — D509 Iron deficiency anemia, unspecified: Secondary | ICD-10-CM | POA: Diagnosis not present

## 2017-10-23 DIAGNOSIS — N186 End stage renal disease: Secondary | ICD-10-CM | POA: Diagnosis not present

## 2017-10-23 DIAGNOSIS — E1129 Type 2 diabetes mellitus with other diabetic kidney complication: Secondary | ICD-10-CM | POA: Diagnosis not present

## 2017-10-24 DIAGNOSIS — E1129 Type 2 diabetes mellitus with other diabetic kidney complication: Secondary | ICD-10-CM | POA: Diagnosis not present

## 2017-10-24 DIAGNOSIS — D631 Anemia in chronic kidney disease: Secondary | ICD-10-CM | POA: Diagnosis not present

## 2017-10-24 DIAGNOSIS — N186 End stage renal disease: Secondary | ICD-10-CM | POA: Diagnosis not present

## 2017-10-24 DIAGNOSIS — D509 Iron deficiency anemia, unspecified: Secondary | ICD-10-CM | POA: Diagnosis not present

## 2017-10-24 DIAGNOSIS — N2581 Secondary hyperparathyroidism of renal origin: Secondary | ICD-10-CM | POA: Diagnosis not present

## 2017-10-28 DIAGNOSIS — D631 Anemia in chronic kidney disease: Secondary | ICD-10-CM | POA: Diagnosis not present

## 2017-10-28 DIAGNOSIS — D509 Iron deficiency anemia, unspecified: Secondary | ICD-10-CM | POA: Diagnosis not present

## 2017-10-28 DIAGNOSIS — N186 End stage renal disease: Secondary | ICD-10-CM | POA: Diagnosis not present

## 2017-10-28 DIAGNOSIS — E1129 Type 2 diabetes mellitus with other diabetic kidney complication: Secondary | ICD-10-CM | POA: Diagnosis not present

## 2017-10-28 DIAGNOSIS — N2581 Secondary hyperparathyroidism of renal origin: Secondary | ICD-10-CM | POA: Diagnosis not present

## 2017-10-29 ENCOUNTER — Other Ambulatory Visit: Payer: Self-pay

## 2017-10-29 ENCOUNTER — Ambulatory Visit (INDEPENDENT_AMBULATORY_CARE_PROVIDER_SITE_OTHER): Payer: Medicare Other | Admitting: Internal Medicine

## 2017-10-29 ENCOUNTER — Encounter: Payer: Self-pay | Admitting: Internal Medicine

## 2017-10-29 VITALS — BP 150/77 | HR 74 | Temp 98.1°F | Ht 72.0 in | Wt 290.0 lb

## 2017-10-29 DIAGNOSIS — E785 Hyperlipidemia, unspecified: Secondary | ICD-10-CM

## 2017-10-29 DIAGNOSIS — R0789 Other chest pain: Secondary | ICD-10-CM

## 2017-10-29 DIAGNOSIS — I2510 Atherosclerotic heart disease of native coronary artery without angina pectoris: Secondary | ICD-10-CM

## 2017-10-29 DIAGNOSIS — Z7982 Long term (current) use of aspirin: Secondary | ICD-10-CM | POA: Diagnosis not present

## 2017-10-29 DIAGNOSIS — I151 Hypertension secondary to other renal disorders: Secondary | ICD-10-CM

## 2017-10-29 DIAGNOSIS — R079 Chest pain, unspecified: Secondary | ICD-10-CM | POA: Diagnosis not present

## 2017-10-29 DIAGNOSIS — R011 Cardiac murmur, unspecified: Secondary | ICD-10-CM | POA: Diagnosis not present

## 2017-10-29 DIAGNOSIS — E1122 Type 2 diabetes mellitus with diabetic chronic kidney disease: Secondary | ICD-10-CM

## 2017-10-29 DIAGNOSIS — Z955 Presence of coronary angioplasty implant and graft: Secondary | ICD-10-CM

## 2017-10-29 DIAGNOSIS — I120 Hypertensive chronic kidney disease with stage 5 chronic kidney disease or end stage renal disease: Secondary | ICD-10-CM | POA: Diagnosis not present

## 2017-10-29 DIAGNOSIS — N2889 Other specified disorders of kidney and ureter: Principal | ICD-10-CM

## 2017-10-29 DIAGNOSIS — R112 Nausea with vomiting, unspecified: Secondary | ICD-10-CM | POA: Diagnosis not present

## 2017-10-29 DIAGNOSIS — Z992 Dependence on renal dialysis: Secondary | ICD-10-CM

## 2017-10-29 DIAGNOSIS — J811 Chronic pulmonary edema: Secondary | ICD-10-CM | POA: Diagnosis present

## 2017-10-29 DIAGNOSIS — N186 End stage renal disease: Secondary | ICD-10-CM | POA: Diagnosis not present

## 2017-10-29 DIAGNOSIS — Z720 Tobacco use: Secondary | ICD-10-CM

## 2017-10-29 MED ORDER — METOCLOPRAMIDE HCL 5 MG PO TABS: 5.0000 mg | ORAL_TABLET | Freq: Three times a day (TID) | ORAL | 1 refills | Status: DC | PRN

## 2017-10-29 MED ORDER — ISOSORBIDE MONONITRATE ER 30 MG PO TB24: 30.0000 mg | ORAL_TABLET | Freq: Every day | ORAL | 11 refills | Status: DC

## 2017-10-29 NOTE — Patient Instructions (Addendum)
FOLLOW-UP INSTRUCTIONS When: 4-6wks with Dr. Trilby Drummer For: Nausea, vomiting What to bring: medications  Please start imdur 30mg  daily; this may help with your chest pain. The most important thing to do is follow up with your cardiologist on Feb 1st and making sure you get your dialysis regularly.  For your nausea and vomiting, you can take reglan 5mg  up to three times daily with meals if needed.  Fluid Restriction Some health conditions may require you to restrict your fluid intake. This means that you need to limit the amount of fluid you drink each day. When you have a fluid restriction, you must carefully measure and keep track of the amount of fluid you drink. Your health care provider will identify the specific amount of fluid you are allowed each day. This amount may depend on several things, such as:  The amount of urine you produce in a day.  How much fluid you are keeping (retaining) in your body.  Your blood pressure.  What is my plan? Your health care provider recommends that you limit your fluid intake to 64 ounces  per day. What counts toward my fluid intake? Your fluid intake includes all liquids that you drink, as well as any foods that become liquid at room temperature. The following are examples of some fluids that you will have to restrict:  Tea, coffee, soda, lemonade, milk, water, juice, sport drinks, and nutritional supplement beverages.  Alcoholic beverages.  Cream.  Gravy.  Ice cubes.  Soup and broth.  The following are examples of foods that become liquid at room temperature. These foods will also count toward your fluid intake.  Ice cream and ice milk.  Frozen yogurt and sherbet.  Frozen ice pops.  Flavored gelatin.  How do I keep track of my fluid intake? Each morning, fill a jug with the amount of water that equals the amount of fluid you are allowed for the day. You can use this water as a guideline for fluid allowance. Each time you take in  any form of fluid, including ice cubes and foods that become liquid at room temperature, pour an equal amount of water out of the container. This helps you to see how much fluid you are taking in. It also helps you to see how much of your fluid intake is left for the rest of the day. The following conversions may also be helpful in measuring your fluid intake:  1 cup equals 8 oz (240 mL).   cup equals 6 oz (180 mL).  ? cup equals 5? oz (160 mL).   cup equals 4 oz (120 mL).  ? cup equals 2? oz (80 mL).   cup equals 2 oz (60 mL).  2 Tbsp equals 1 oz (30 mL).  What home care instructions should I follow while restricting fluids?  Make sure that you stay within the recommended limit each day. Always measure and keep track of your fluids, as well as any foods that turn liquid at room temperature.  Use small cups and glasses and learn to sip fluids slowly.  Add a slice of fresh lemon or lemon juice to water or ice. This helps to satisfy your thirst.  Freeze fruit juice or water in an ice cube tray. Use this as part of your fluid allowance. These cubes are useful for quenching your thirst. Measure the amount of liquid in each ice cube prior to freezing so you can subtract this amount from your day's allowance when you consume each frozen  cube.  Try frozen fruits between meals, such as grapes or strawberries.  Swallow your pills along with meals or soft foods, such as applesauce or mashed potatoes. This helps you to save your fluid allowance for something that you enjoy.  Weigh yourself every day. Keeping track of your daily weight can help you and your health care provider to notice as soon as possible if you are retaining too much fluid in your body. ? Weigh yourself every morning after you urinate but before you eat breakfast. ? Wear the same amount of clothing each time you weigh yourself. ? Write down your daily weight. Give this weight record to your health care provider. If your  weight is going up, you may be retaining too much fluid. Every 2 cups (480 mL) of fluid retained in the body becomes an extra 1 lb (0.45 kg) of body weight.  Avoid salty foods. These foods make you thirsty and make fluid control more difficult.  Brush your teeth often or rinse your mouth with mouthwash to help your dry mouth. Lemon wedges, hard sour candies, chewing gum, or breath spray may also help to moisten your mouth.  Keep the temperature in your home at a cooler level. Dry air increases thirst, so keep the air in your home as humid as possible.  Avoid being out in the hot sun, which can cause you to sweat and become thirsty. What are some signs that I may be taking in too much fluid? You may be taking in too much fluid if:  Your weight increases. Contact your health care provider if your weight increases 3 lb or more in a day or if it increases 5 lb or more in a week.  Your face, hands, legs, feet, and belly (abdomen) start to swell.  You have trouble breathing.  This information is not intended to replace advice given to you by your health care provider. Make sure you discuss any questions you have with your health care provider. Document Released: 07/22/2007 Document Revised: 03/01/2016 Document Reviewed: 02/23/2014 Elsevier Interactive Patient Education  [employer].

## 2017-10-29 NOTE — Assessment & Plan Note (Signed)
BP elevated, patient states left HD prior to getting to dry weight.   Plan: --patient to discuss with his nephrologist if there are ways like slower UF rate to make HD more tolerable

## 2017-10-29 NOTE — Assessment & Plan Note (Signed)
Patient states he is down to smoking 3-4 cigarettes every few days, mainly when stressed. He is interested in smoking cessation.  Due to intermittent smoking, nicotine patch may not be best option for him. We discussed using nicotine lozenges and gum when he does get a craving, with which he is in agreement.  In future if not effective, can consider chantix (max dose 0.5mg  daily for renal adjustment) or wellbutrin (no specific guidelines for dosage adjustment per uptodate but recommend reducing the dose)

## 2017-10-29 NOTE — Assessment & Plan Note (Addendum)
Patient reports compliance with HD but does state he had to move around his HD schedule (get an early treatment and wait 4 days for next) due to out of town trip; he also frequently stops his sessions early due to cramps, n/v, hypotension. He states he is compliant with fluid restriction but is unable to say how much he drinks throughout the day.   He reports significant improvement in his breathing but does still at times get short of breath; he states that he was 3kgs above dry weight yesterday after HD (had to be taken off due to hypotension).  On exam he appears euvolemic. He does have a significant holosystolic murmur best heard at LUSB; Echo 2017 showed mod MR and mod-severe TR - which may be contributing to continued dyspnea and pulm edema in addition to his incomplete HD.  Plan: --advised patient to limit fluid intake to 1872ml per day and sodium intake to 2mg  daily --advised to discuss with nephrology HD settings to avoid hypotension, n/v if able during HD so he might be able to complete more sessions fully --pt to see cardiology in 2 weeks; would recommend repeat echo to evaluate valves

## 2017-10-29 NOTE — Assessment & Plan Note (Signed)
Patient endorses intermittent chest pain that he describes as tingling mainly in mid-clavicular R&L ant chest but does at times occur substernal. Non-radiating. This has been going on for months. He states it is similar to pain he had prior to having his coronary stents placed. At times it is associated with pain as well. This occurs with rest and exertion. Sometimes associated with flushing, N/V but not always.  Exam reveals holosystolic murmur best heard at LUSB. Pain not reproducible.   By criteria, he is having noncardiac pain, however he does report similar symptoms before previous cardiac stents.   He is currently pain free so no concern for current ACS.  Plan: --patient has cardiology f/u in 2 weeks; would consider repeat stress testing --continue amlodipine 10mg  daily and carvedilol 25mg  BID; start imdur 30mg  daily for anti-anginal effects

## 2017-10-29 NOTE — Progress Notes (Signed)
CC: chest pain  HPI:  [name] is a 62 y.o. with a PMH of ESRD on HD MWF, HTN, HLD, CAD s/p 2 stents, T2DM presenting to clinic for hospital follow up for pulmonary edema and chest pain.  Patient admitted to [facility] after presenting with chest pain and shortness of breath after missing HD session; he was found to have pulm edema and hyperkalemia to 7.4. He has emergent HD as well as medical management of his hyperkalemia while waiting for HD. Two days later he presented to [facility] (due to long ED wait at [facility]) for chest pain and shortness of breath. He again was found to have pulmonary edema and had HD. Potassium at the time was stable.   Patient reports compliance with HD but does state he had to move around his HD schedule (get an early treatment and wait 4 days for next) due to out of town trip; he also frequently stops his sessions early due to cramps, n/v, hypotension. He states he is compliant with fluid restriction but is unable to say how much he drinks throughout the day.   He reports significant improvement in his breathing but does still at times get short of breath; he states that he was 3kgs above dry weight yesterday after HD (had to be taken off due to hypotension).   Patient endorses intermittent chest pain that he describes as tingling mainly in mid-clavicular R&L ant chest but does at times occur substernal. This has been going on for months. He states it is similar to pain he had prior to having his coronary stents placed. At times it is associated with pain as well. This occurs with rest and exertion. Sometimes associated with flushing, N/V but not always.  Patient also endorses intermittent N/V mostly associated during and right after HD, but also other days. He endorses lots of bloating after drinking fluids.  Please see problem based Assessment and Plan for status of patients chronic conditions.  Past Medical History:  Diagnosis Date  . Anemia   . Anginal pain ([place])     . CAD (coronary artery disease)    a. per [facility] s/p 3.54mm x 23mm Vision BMS to mid LAD 12/2009 and Xience DES to mid LAD 10/2010.  [name] Chronic diastolic CHF (congestive heart failure) ([place])   . Colon polyps   . Daily headache   . ESRD on dialysis [facility]) since ~ 2008   "MWF; Jeneen Rinks" (03/04/2017)  . GERD (gastroesophageal reflux disease)   . Heart murmur   . Hematochezia    a. 2014: colonscopy, which showed moderately-sized internal hemorrhoids, two 67mm polyps in transverse colon and ascending colon that were resected, five 2-64mm polyps in sigmoid colon, descending colon, transverse colon, and ascending colon that were resected. An upper endoscopy was performed and showed normal esophagus, stomach, and duodenum.  . Hematuria    a. H/o hematuria 2014 with cystoscopy that was unrevealing for his source of hematuria. He underwent a kidney ultrasound on 10/14 that showed mildly echogenic and scarred kidneys compatible with medical renal disease, without hydronephrosis or renal calculi.  [name] History of blood transfusion    "had colonoscopy done; they had to give me some blood"  . Hyperlipidemia   . Hypertension   . On home oxygen therapy    "2L prn" (07/21/2015); "been off it for awhile" (03/04/2017)  . Renal insufficiency   . 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 Type II  diabetes mellitus ([place])   . Wears dentures     Review of Systems:   ROS Per HPI  Physical Exam:  Vitals:   10/29/17 1058 10/29/17 1159  BP: (!) 150/81 (!) 150/77  Pulse: 79 74  Temp: 98.1 F (36.7 C)   TempSrc: Oral   SpO2: 98%   Weight: 290 lb (131.5 kg)   Height: 6' (1.829 m)    GENERAL- alert, co-operative, appears as stated age, not in any distress. HEENT- no JVD CARDIAC- RRR, holosystolic murmur best heard at LUSB. No chest wall tenderness RESP- Moving equal volumes of air, and clear to auscultation bilaterally, no wheezes or crackles. ABDOMEN- Soft, nontender, bowel  sounds present. EXTREMITIES- pulse 2+ PT, symmetric, no pedal edema.[name] PSYCH- Normal mood and affect, appropriate thought content and speech.  Assessment & Plan:   See Encounters Tab for problem based charting.   Patient discussed with Dr. Gerrit Friends, MD Internal Medicine PGY2

## 2017-10-29 NOTE — Assessment & Plan Note (Addendum)
Patient endorses intermittent N/V mostly associated during and right after HD, but also other days. He endorses lots of bloating after drinking fluids. He endorses symptoms of GERD but only once every couple of weeks  From chart review it appears that similar problems were brought up previously (last in 03/2016); he was empirically treated with PPI and set up for gastric emptying study in setting of diabetes. At this time his GERD symptoms are not too bothersome.  Plan: --reglan 5mg  TID PRN  --pt to discuss with nephro about HD settings --f/u in 1 month w/ PCP for recheck

## 2017-10-30 DIAGNOSIS — D631 Anemia in chronic kidney disease: Secondary | ICD-10-CM | POA: Diagnosis not present

## 2017-10-30 DIAGNOSIS — E1129 Type 2 diabetes mellitus with other diabetic kidney complication: Secondary | ICD-10-CM | POA: Diagnosis not present

## 2017-10-30 DIAGNOSIS — D509 Iron deficiency anemia, unspecified: Secondary | ICD-10-CM | POA: Diagnosis not present

## 2017-10-30 DIAGNOSIS — N2581 Secondary hyperparathyroidism of renal origin: Secondary | ICD-10-CM | POA: Diagnosis not present

## 2017-10-30 DIAGNOSIS — N186 End stage renal disease: Secondary | ICD-10-CM | POA: Diagnosis not present

## 2017-10-30 NOTE — Progress Notes (Signed)
Internal Medicine Clinic Attending  Case discussed with Dr. Svalina  at the time of the visit.  We reviewed the resident's history and exam and pertinent patient test results.  I agree with the assessment, diagnosis, and plan of care documented in the resident's note.  

## 2017-11-01 DIAGNOSIS — D509 Iron deficiency anemia, unspecified: Secondary | ICD-10-CM | POA: Diagnosis not present

## 2017-11-01 DIAGNOSIS — N186 End stage renal disease: Secondary | ICD-10-CM | POA: Diagnosis not present

## 2017-11-01 DIAGNOSIS — N2581 Secondary hyperparathyroidism of renal origin: Secondary | ICD-10-CM | POA: Diagnosis not present

## 2017-11-01 DIAGNOSIS — D631 Anemia in chronic kidney disease: Secondary | ICD-10-CM | POA: Diagnosis not present

## 2017-11-01 DIAGNOSIS — E1129 Type 2 diabetes mellitus with other diabetic kidney complication: Secondary | ICD-10-CM | POA: Diagnosis not present

## 2017-11-04 DIAGNOSIS — D509 Iron deficiency anemia, unspecified: Secondary | ICD-10-CM | POA: Diagnosis not present

## 2017-11-04 DIAGNOSIS — N2581 Secondary hyperparathyroidism of renal origin: Secondary | ICD-10-CM | POA: Diagnosis not present

## 2017-11-04 DIAGNOSIS — N186 End stage renal disease: Secondary | ICD-10-CM | POA: Diagnosis not present

## 2017-11-04 DIAGNOSIS — E1129 Type 2 diabetes mellitus with other diabetic kidney complication: Secondary | ICD-10-CM | POA: Diagnosis not present

## 2017-11-04 DIAGNOSIS — D631 Anemia in chronic kidney disease: Secondary | ICD-10-CM | POA: Diagnosis not present

## 2017-11-06 DIAGNOSIS — E1129 Type 2 diabetes mellitus with other diabetic kidney complication: Secondary | ICD-10-CM | POA: Diagnosis not present

## 2017-11-06 DIAGNOSIS — N186 End stage renal disease: Secondary | ICD-10-CM | POA: Diagnosis not present

## 2017-11-06 DIAGNOSIS — D631 Anemia in chronic kidney disease: Secondary | ICD-10-CM | POA: Diagnosis not present

## 2017-11-06 DIAGNOSIS — D509 Iron deficiency anemia, unspecified: Secondary | ICD-10-CM | POA: Diagnosis not present

## 2017-11-06 DIAGNOSIS — N2581 Secondary hyperparathyroidism of renal origin: Secondary | ICD-10-CM | POA: Diagnosis not present

## 2017-11-07 DIAGNOSIS — Z992 Dependence on renal dialysis: Secondary | ICD-10-CM | POA: Diagnosis not present

## 2017-11-07 DIAGNOSIS — N186 End stage renal disease: Secondary | ICD-10-CM | POA: Diagnosis not present

## 2017-11-07 DIAGNOSIS — E1122 Type 2 diabetes mellitus with diabetic chronic kidney disease: Secondary | ICD-10-CM | POA: Diagnosis not present

## 2017-11-08 ENCOUNTER — Emergency Department (HOSPITAL_COMMUNITY): Payer: Medicare Other

## 2017-11-08 ENCOUNTER — Encounter (HOSPITAL_COMMUNITY): Payer: Self-pay | Admitting: Emergency Medicine

## 2017-11-08 ENCOUNTER — Emergency Department (HOSPITAL_COMMUNITY)
Admission: EM | Admit: 2017-11-08 | Discharge: 2017-11-08 | Disposition: A | Discharge status: Home / Self Care | Payer: Medicare Other | Attending: Emergency Medicine | Admitting: Emergency Medicine

## 2017-11-08 DIAGNOSIS — Z91040 Latex allergy status: Secondary | ICD-10-CM | POA: Diagnosis not present

## 2017-11-08 DIAGNOSIS — I132 Hypertensive heart and chronic kidney disease with heart failure and with stage 5 chronic kidney disease, or end stage renal disease: Secondary | ICD-10-CM | POA: Diagnosis not present

## 2017-11-08 DIAGNOSIS — I340 Nonrheumatic mitral (valve) insufficiency: Secondary | ICD-10-CM | POA: Diagnosis not present

## 2017-11-08 DIAGNOSIS — Z79899 Other long term (current) drug therapy: Secondary | ICD-10-CM | POA: Insufficient documentation

## 2017-11-08 DIAGNOSIS — Z992 Dependence on renal dialysis: Secondary | ICD-10-CM | POA: Diagnosis not present

## 2017-11-08 DIAGNOSIS — I120 Hypertensive chronic kidney disease with stage 5 chronic kidney disease or end stage renal disease: Secondary | ICD-10-CM | POA: Diagnosis not present

## 2017-11-08 DIAGNOSIS — N186 End stage renal disease: Secondary | ICD-10-CM | POA: Insufficient documentation

## 2017-11-08 DIAGNOSIS — E785 Hyperlipidemia, unspecified: Secondary | ICD-10-CM | POA: Insufficient documentation

## 2017-11-08 DIAGNOSIS — I5032 Chronic diastolic (congestive) heart failure: Secondary | ICD-10-CM | POA: Diagnosis not present

## 2017-11-08 DIAGNOSIS — E1129 Type 2 diabetes mellitus with other diabetic kidney complication: Secondary | ICD-10-CM | POA: Diagnosis not present

## 2017-11-08 DIAGNOSIS — E119 Type 2 diabetes mellitus without complications: Secondary | ICD-10-CM | POA: Diagnosis not present

## 2017-11-08 DIAGNOSIS — D631 Anemia in chronic kidney disease: Secondary | ICD-10-CM | POA: Diagnosis not present

## 2017-11-08 DIAGNOSIS — F17210 Nicotine dependence, cigarettes, uncomplicated: Secondary | ICD-10-CM | POA: Diagnosis not present

## 2017-11-08 DIAGNOSIS — E1122 Type 2 diabetes mellitus with diabetic chronic kidney disease: Secondary | ICD-10-CM | POA: Diagnosis not present

## 2017-11-08 DIAGNOSIS — D509 Iron deficiency anemia, unspecified: Secondary | ICD-10-CM | POA: Diagnosis not present

## 2017-11-08 DIAGNOSIS — N2581 Secondary hyperparathyroidism of renal origin: Secondary | ICD-10-CM | POA: Diagnosis not present

## 2017-11-08 DIAGNOSIS — R079 Chest pain, unspecified: Secondary | ICD-10-CM | POA: Diagnosis not present

## 2017-11-08 DIAGNOSIS — I25118 Atherosclerotic heart disease of native coronary artery with other forms of angina pectoris: Secondary | ICD-10-CM | POA: Diagnosis not present

## 2017-11-08 LAB — CBC
HCT: 33.3 % — ABNORMAL LOW (ref 39.0–52.0)
HEMOGLOBIN: 10.7 g/dL — AB (ref 13.0–17.0)
MCH: 28.4 pg (ref 26.0–34.0)
MCHC: 32.1 g/dL (ref 30.0–36.0)
MCV: 88.3 fL (ref 78.0–100.0)
PLATELETS: 169 10*3/uL (ref 150–400)
RBC: 3.77 MIL/uL — AB (ref 4.22–5.81)
RDW: 14.7 % (ref 11.5–15.5)
WBC: 6.7 10*3/uL (ref 4.0–10.5)

## 2017-11-08 LAB — I-STAT TROPONIN, ED
TROPONIN I, POC: 0.02 ng/mL (ref 0.00–0.08)
TROPONIN I, POC: 0.02 ng/mL (ref 0.00–0.08)

## 2017-11-08 LAB — BASIC METABOLIC PANEL
ANION GAP: 17 — AB (ref 5–15)
BUN: 53 mg/dL — ABNORMAL HIGH (ref 6–20)
CHLORIDE: 96 mmol/L — AB (ref 101–111)
CO2: 27 mmol/L (ref 22–32)
CREATININE: 10.88 mg/dL — AB (ref 0.61–1.24)
Calcium: 9.3 mg/dL (ref 8.9–10.3)
GFR calc non Af Amer: 4 mL/min — ABNORMAL LOW (ref >60)
GFR, EST AFRICAN AMERICAN: 5 mL/min — AB (ref >60)
Glucose, Bld: 99 mg/dL (ref 65–99)
Potassium: 4.7 mmol/L (ref 3.5–5.1)
SODIUM: 140 mmol/L (ref 135–145)

## 2017-11-08 MED ORDER — NITROGLYCERIN 0.4 MG SL SUBL
0.4000 mg | SUBLINGUAL_TABLET | SUBLINGUAL | Status: DC | PRN
  Administered 2017-11-08 (×2): 0.4 mg via SUBLINGUAL
  Filled 2017-11-08: qty 1

## 2017-11-08 NOTE — ED Notes (Signed)
ED Provider at bedside. 

## 2017-11-08 NOTE — Discharge Instructions (Signed)
Keep your appointment for dialysis today as scheduled.  Follow-up with your cardiologist later today as scheduled.

## 2017-11-08 NOTE — ED Provider Notes (Signed)
[facility] EMERGENCY DEPARTMENT Provider Note   CSN: [id number] Arrival date & time: 11/08/17  0123     History   Chief Complaint Chief Complaint  Patient presents with  . Chest Pain    HPI [name] [name] is a 62 y.o. male.  Patient is a 62 year old male with past medical history of diabetes, hypertension, coronary artery disease with stent in 2009, end-stage renal disease on hemodialysis.  He presents today for evaluation of chest pain.  This started this evening at approximately 1130 while he was sleeping.  He describes a sharp pain that starts in the center of his chest and radiates into his abdomen.  He denies any shortness of breath, nausea, or diaphoresis.  He denies any radiation to the arm or jaw.  His cardiologist is with [facility].  He tells me he had a heart cath for 5 years ago that showed that his stents were open and no additional lesions.   The history is provided by the patient.  Chest Pain   This is a new problem. The current episode started 1 to 2 hours ago. The problem occurs constantly. The problem has been gradually improving. The pain is present in the substernal region. The quality of the pain is described as heavy. The pain radiates to the epigastrium. Pertinent negatives include no cough, no diaphoresis, no fever and no shortness of breath.    Past Medical History:  Diagnosis Date  . Anemia   . Anginal pain ([place])   . CAD (coronary artery disease)    a. per [facility]where s/p 3.12mm x 39mm Vision BMS to mid LAD 12/2009 and Xience DES to mid LAD 10/2010.  [name] Chronic diastolic CHF (congestive heart failure) ([place])   . Colon polyps   . Daily headache   . ESRD on dialysis [facility]) since ~ 2008   "MWF; Jeneen Rinks" (03/04/2017)  . GERD (gastroesophageal reflux disease)   . Heart murmur   . Hematochezia    a. 2014: colonscopy, which showed moderately-sized internal hemorrhoids, two 48mm polyps in transverse colon and ascending colon that were resected,  five 2-66mm polyps in sigmoid colon, descending colon, transverse colon, and ascending colon that were resected. An upper endoscopy was performed and showed normal esophagus, stomach, and duodenum.  . Hematuria    a. H/o hematuria 2014 with cystoscopy that was unrevealing for his source of hematuria. He underwent a kidney ultrasound on 10/14 that showed mildly echogenic and scarred kidneys compatible with medical renal disease, without hydronephrosis or renal calculi.  [name] History of blood transfusion    "had colonoscopy done; they had to give me some blood"  . Hyperlipidemia   . Hypertension   . On home oxygen therapy    "2L prn" (07/21/2015); "been off it for awhile" (03/04/2017)  . Renal insufficiency   . 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 Type II diabetes mellitus ([place])   . Wears dentures     Patient Active Problem List   Diagnosis Date Noted  . Tobacco use 10/29/2017  . Mild cognitive impairment 06/13/2017  . Chest pain 06/07/2017  . Skin ulcer of toe of right foot, limited to breakdown of skin ([place])   . Callus of foot 03/07/2017  . Nausea and vomiting 04/02/2016  . ESRD (end stage renal disease) ([place]) 03/18/2016  . Mitral regurgitation   . Diabetic neuropathy ([place]) 07/29/2015  . Depression 07/22/2015  . GERD (gastroesophageal reflux disease) 06/04/2015  .  Malnutrition of moderate degree ([place]) 05/17/2015  . Thrombocytopenia ([place]) 05/16/2015  . Weight loss 05/16/2015  . H/O TIA (transient ischemic attack) 04/01/2015  . Diastolic dysfunction-grade 2 12/13/2014  . Hypertension 04/27/2014  . CAD -S/P LAD BMS 2011, LAD DES 2012- patent cors Feb 2016 04/27/2014  . Diabetes mellitus type 2, diet-controlled ([place]) 04/27/2014    Past Surgical History:  Procedure Laterality Date  . AV FISTULA PLACEMENT Left ~ 2007   "upper arm"  . CARDIAC CATHETERIZATION  "several"  . CORONARY ANGIOPLASTY WITH STENT PLACEMENT  "several"  . CYSTOSCOPY W/ STONE  MANIPULATION  X2?  . EYE SURGERY Bilateral    "laser OR for hemorrhage"  . LEFT HEART CATHETERIZATION WITH CORONARY ANGIOGRAM N/A 11/23/2014   Procedure: LEFT HEART CATHETERIZATION WITH CORONARY ANGIOGRAM;  Surgeon: Troy Sine, MD;  Location[facility] C[facility] LAB;  Service: Cardiovascular;  Laterality: N/A;  . LITHOTRIPSY  X1  . REVISON OF ARTERIOVENOUS FISTULA Left 98/92/1194   Procedure: PLICATION OF LEFT ARM ARTERIOVENOUS FISTULA;  Surgeon: Angelia Mould, MD;  Location: [facility];  Service: Vascular;  Laterality: Left;       Home Medications    Prior to Admission medications   Medication Sig Start Date End Date Taking? Authorizing Provider  albuterol (PROVENTIL HFA;VENTOLIN HFA) 108 (90 Base) MCG/ACT inhaler Inhale 1-2 puffs into the lungs every 6 (six) hours as needed for wheezing or shortness of breath. 01/19/16   Delsa Grana, PA-C  amLODipine (NORVASC) 10 MG tablet Take 10 mg by mouth at bedtime. 05/03/17   [provider]  aspirin 81 MG chewable tablet Chew 1 tablet (81 mg total) by mouth daily. Patient taking differently: Chew 81 mg by mouth at bedtime.  07/22/15   Iline Oven, MD  atorvastatin (LIPITOR) 20 MG tablet Take 20 mg by mouth at bedtime. Reported on 03/14/2016    [provider]  carvedilol (COREG) 25 MG tablet Take 1 tablet (25 mg total) by mouth See admin instructions. Take 1 tablet (25 mg) by mouth daily at bedtime, take 1 tablet (25 mg) on Sunday, Tuesday, Thursday, Saturday mornings 10/09/17   Molt, Romelle Starcher, DO  cinacalcet (SENSIPAR) 90 MG tablet Take 180 mg by mouth every Monday, Wednesday, and Friday with hemodialysis.     [provider]  isosorbide mononitrate (IMDUR) 30 MG 24 hr tablet Take 1 tablet (30 mg total) by mouth daily. 10/29/17 10/29/18  Alphonzo Grieve, MD  metoCLOPramide (REGLAN) 5 MG tablet Take 1 tablet (5 mg total) by mouth 3 (three) times daily as needed for nausea. 10/29/17 10/29/18  Alphonzo Grieve, MD  multivitamin  (RENA-VIT) TABS tablet Take 1 tablet by mouth at bedtime. 12/15/14   Geradine Girt, DO  nitroGLYCERIN (NITROSTAT) 0.4 MG SL tablet Place 0.4 mg under the tongue every 5 (five) minutes as needed for chest pain.    [provider]  sevelamer carbonate (RENVELA) 800 MG tablet Take 4 tablets (3,200 mg total) by mouth 3 (three) times daily with meals. Patient taking differently: Take 2,400-3,200 mg by mouth See admin instructions. Take 4 tablets (3200 mg) by mouth three times daily with meals and 3 tablets (2400 mg) with snacks 03/12/16   Geradine Girt, DO    Family History Family History  Problem Relation Age of Onset  . Bone cancer Mother   . Anuerysm Father   . Hypertension Unknown   . Diabetes type II Daughter     Social History Social History   Tobacco Use  .  Smoking status: Current Some Day Smoker    Packs/day: 0.50    Years: 8.00    Pack years: 4.00    Types: Cigarettes  . Smokeless tobacco: Never Used  . Tobacco comment: 3-4 per day.  wants patch  Substance Use Topics  . Alcohol use: No    Alcohol/week: 0.0 oz  . Drug use: No     Allergies   Enalapril; Latex; and Tape   Review of Systems Review of Systems  Constitutional: Negative for diaphoresis and fever.  Respiratory: Negative for cough and shortness of breath.   Cardiovascular: Positive for chest pain.  All other systems reviewed and are negative.    Physical Exam Updated Vital Signs BP (!) 160/85 (BP Location: Right Arm)   Pulse 83   Temp 97.7 F (36.5 C) (Oral)   Resp 18   Ht 6' (1.829 m)   Wt 135.2 kg (298 lb)   SpO2 97%   BMI 40.42 kg/m   Physical Exam  Constitutional: He is oriented to person, place, and time. He appears well-developed and well-nourished. No distress.  HENT:  Head: Normocephalic and atraumatic.  Mouth/Throat: Oropharynx is clear and moist.  Neck: Normal range of motion. Neck supple.  Cardiovascular: Normal rate and regular rhythm. Exam reveals no friction rub.  No  murmur heard. Pulmonary/Chest: Effort normal and breath sounds normal. No respiratory distress. He has no wheezes. He has no rales.  Abdominal: Soft. Bowel sounds are normal. He exhibits no distension. There is no tenderness.  Musculoskeletal: Normal range of motion. He exhibits no edema.       Right lower leg: Normal. He exhibits no tenderness and no edema.       Left lower leg: Normal. He exhibits no tenderness and no edema.  Neurological: He is alert and oriented to person, place, and time. Coordination normal.  Skin: Skin is warm and dry. He is not diaphoretic.  Nursing note and vitals reviewed.    ED Treatments / Results  Labs (all labs ordered are listed, but only abnormal results are displayed) Labs Reviewed  CBC - Abnormal; Notable for the following components:      Result Value   RBC 3.77 (*)    Hemoglobin 10.7 (*)    HCT 33.3 (*)    All other components within normal limits  BASIC METABOLIC PANEL  I-STAT TROPONIN, ED    EKG  EKG Interpretation  Date/Time:  Friday November 08 2017 01:29:02 EST Ventricular Rate:  81 PR Interval:  158 QRS Duration: 96 QT Interval:  388 QTC Calculation: 450 R Axis:   64 Text Interpretation:  Normal sinus rhythm Nonspecific ST abnormality Abnormal ECG Confirmed by Veryl Speak [phone]) on 11/08/2017 1:43:04 AM       Radiology No results found.  Procedures Procedures (including critical care time)  Medications Ordered in ED Medications - No data to display   Initial Impression / Assessment and Plan / ED Course  I have reviewed the triage vital signs and the nursing notes.  Pertinent labs & imaging results that were available during my care of the patient were reviewed by me and considered in my medical decision making (see chart for details).  Patient presents here with complaints of chest discomfort that is atypical for cardiac pain.  His troponin x2 is negative.  He had no relief with nitroglycerin and his discomfort  eventually subsided spontaneously.  His EKG is unchanged.  He has an appointment this afternoon with his cardiologist at [facility] and is  requesting discharge so that he can make his a dialysis appointment and doctor's appointment today.  Final Clinical Impressions(s) / ED Diagnoses   Final diagnoses:  None    ED Discharge Orders    None       Veryl Speak, MD 11/08/17 [phone]

## 2017-11-08 NOTE — ED Notes (Signed)
Patient transported to X-ray 

## 2017-11-08 NOTE — ED Notes (Signed)
Offered pt third NTG tablet, pt refused, states he "wants to hold off to see if pain resolved".

## 2017-11-08 NOTE — ED Triage Notes (Signed)
Pt reports CP onset earlier this evening, central, non radiating 7/10. Pt reports the pain takes his breath away. He initially thought the pain was gas but states it has only worsened throughout the night

## 2017-11-11 DIAGNOSIS — D509 Iron deficiency anemia, unspecified: Secondary | ICD-10-CM | POA: Diagnosis not present

## 2017-11-11 DIAGNOSIS — N186 End stage renal disease: Secondary | ICD-10-CM | POA: Diagnosis not present

## 2017-11-11 DIAGNOSIS — N2581 Secondary hyperparathyroidism of renal origin: Secondary | ICD-10-CM | POA: Diagnosis not present

## 2017-11-11 DIAGNOSIS — D631 Anemia in chronic kidney disease: Secondary | ICD-10-CM | POA: Diagnosis not present

## 2017-11-11 DIAGNOSIS — E1129 Type 2 diabetes mellitus with other diabetic kidney complication: Secondary | ICD-10-CM | POA: Diagnosis not present

## 2017-11-13 DIAGNOSIS — R072 Precordial pain: Secondary | ICD-10-CM | POA: Diagnosis not present

## 2017-11-13 DIAGNOSIS — I120 Hypertensive chronic kidney disease with stage 5 chronic kidney disease or end stage renal disease: Secondary | ICD-10-CM | POA: Diagnosis not present

## 2017-11-13 DIAGNOSIS — Z01810 Encounter for preprocedural cardiovascular examination: Secondary | ICD-10-CM | POA: Diagnosis not present

## 2017-11-13 DIAGNOSIS — I2510 Atherosclerotic heart disease of native coronary artery without angina pectoris: Secondary | ICD-10-CM | POA: Diagnosis not present

## 2017-11-13 DIAGNOSIS — R079 Chest pain, unspecified: Secondary | ICD-10-CM | POA: Diagnosis not present

## 2017-11-13 DIAGNOSIS — R5383 Other fatigue: Secondary | ICD-10-CM | POA: Diagnosis not present

## 2017-11-13 DIAGNOSIS — R0600 Dyspnea, unspecified: Secondary | ICD-10-CM | POA: Diagnosis not present

## 2017-11-13 DIAGNOSIS — Z955 Presence of coronary angioplasty implant and graft: Secondary | ICD-10-CM | POA: Diagnosis not present

## 2017-11-13 DIAGNOSIS — N186 End stage renal disease: Secondary | ICD-10-CM | POA: Diagnosis not present

## 2017-11-13 DIAGNOSIS — E1122 Type 2 diabetes mellitus with diabetic chronic kidney disease: Secondary | ICD-10-CM | POA: Diagnosis not present

## 2017-11-13 DIAGNOSIS — I340 Nonrheumatic mitral (valve) insufficiency: Secondary | ICD-10-CM | POA: Diagnosis not present

## 2017-11-14 ENCOUNTER — Telehealth: Payer: Self-pay | Admitting: Internal Medicine

## 2017-11-14 ENCOUNTER — Encounter: Payer: Self-pay | Admitting: Internal Medicine

## 2017-11-14 ENCOUNTER — Other Ambulatory Visit: Payer: Self-pay

## 2017-11-14 ENCOUNTER — Ambulatory Visit (INDEPENDENT_AMBULATORY_CARE_PROVIDER_SITE_OTHER): Payer: Medicare Other | Admitting: Internal Medicine

## 2017-11-14 VITALS — BP 169/89 | HR 89 | Temp 98.0°F | Ht 72.0 in | Wt 209.2 lb

## 2017-11-14 DIAGNOSIS — Z720 Tobacco use: Secondary | ICD-10-CM | POA: Diagnosis not present

## 2017-11-14 DIAGNOSIS — Z9861 Coronary angioplasty status: Principal | ICD-10-CM

## 2017-11-14 DIAGNOSIS — Z7982 Long term (current) use of aspirin: Secondary | ICD-10-CM | POA: Diagnosis not present

## 2017-11-14 DIAGNOSIS — I10 Essential (primary) hypertension: Secondary | ICD-10-CM

## 2017-11-14 DIAGNOSIS — Z955 Presence of coronary angioplasty implant and graft: Secondary | ICD-10-CM

## 2017-11-14 DIAGNOSIS — I25118 Atherosclerotic heart disease of native coronary artery with other forms of angina pectoris: Secondary | ICD-10-CM | POA: Diagnosis not present

## 2017-11-14 DIAGNOSIS — I2510 Atherosclerotic heart disease of native coronary artery without angina pectoris: Secondary | ICD-10-CM

## 2017-11-14 DIAGNOSIS — Z79899 Other long term (current) drug therapy: Secondary | ICD-10-CM

## 2017-11-14 DIAGNOSIS — Z992 Dependence on renal dialysis: Secondary | ICD-10-CM

## 2017-11-14 DIAGNOSIS — R0989 Other specified symptoms and signs involving the circulatory and respiratory systems: Secondary | ICD-10-CM

## 2017-11-14 DIAGNOSIS — Z01810 Encounter for preprocedural cardiovascular examination: Secondary | ICD-10-CM | POA: Diagnosis not present

## 2017-11-14 DIAGNOSIS — I151 Hypertension secondary to other renal disorders: Secondary | ICD-10-CM

## 2017-11-14 DIAGNOSIS — N2889 Other specified disorders of kidney and ureter: Secondary | ICD-10-CM

## 2017-11-14 NOTE — Assessment & Plan Note (Addendum)
Patient has a history of x2 prior PCI to LAD (2011 and 2012) and moderate MR. He is presenting to the clinic today requesting surgical clearance.  He was seen by cardiothoracic surgery yesterday at [facility] for potential surgical need for CABG + MV replacement.  Patient reports having shortness of breath at rest and intermittent angina.  States the shortness of breath is not new, he has had it for several years.  He denies having any chest pain at present.  He missed hemodialysis yesterday because he was at the hospital.  His next dialysis session is tomorrow at 7 AM. On exam, he has mild bibasilar rales and no lower extremity edema.  Weight stable.  Patient was breathing comfortably at rest with SPO2 96%, however, SPO2 dropped to 73% when he ambulated in the clinic. Oxygen saturation improved to >95% with 2L O2 via [place].   Plan -Based on the revised cardiac risk index calculator patient is class IV risk (5 points) with a 15% risk of major cardiac event during surgery.  We would like to recommend clearance from cardiology prior to his surgery. -Advised him to continue his antianginal therapy: sublingual nitroglycerin and Imdur -[facility] and they will arrange portable oxygen for the patient as soon as possible.  He will need 2 L of oxygen with ambulation. -Advised patient to go for dialysis tomorrow morning

## 2017-11-14 NOTE — Progress Notes (Signed)
   CC: SOB, surgical clearance   HPI:  [name] is a 62 y.o. male with a PMHx of conditions listed below presenting to the clinic complaining of SOB and requesting surgical clearance for CABG and MVR. Please see problem based charting for the status of the patient's current and chronic medical conditions.   Past Medical History:  Diagnosis Date  . Anemia   . Anginal pain ([place])   . CAD (coronary artery disease)    a. per [facility] s/p 3.33mm x 7mm Vision BMS to mid LAD 12/2009 and Xience DES to mid LAD 10/2010.  [name] Chronic diastolic CHF (congestive heart failure) ([place])   . Colon polyps   . Daily headache   . ESRD on dialysis [facility]) since ~ 2008   "MWF; Jeneen Rinks" (03/04/2017)  . GERD (gastroesophageal reflux disease)   . Heart murmur   . Hematochezia    a. 2014: colonscopy, which showed moderately-sized internal hemorrhoids, two 32mm polyps in transverse colon and ascending colon that were resected, five 2-53mm polyps in sigmoid colon, descending colon, transverse colon, and ascending colon that were resected. An upper endoscopy was performed and showed normal esophagus, stomach, and duodenum.  . Hematuria    a. H/o hematuria 2014 with cystoscopy that was unrevealing for his source of hematuria. He underwent a kidney ultrasound on 10/14 that showed mildly echogenic and scarred kidneys compatible with medical renal disease, without hydronephrosis or renal calculi.  [name] History of blood transfusion    "had colonoscopy done; they had to give me some blood"  . Hyperlipidemia   . Hypertension   . On home oxygen therapy    "2L prn" (07/21/2015); "been off it for awhile" (03/04/2017)  . Renal insufficiency   . 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 Type II diabetes mellitus ([place])   . Wears dentures    Review of Systems: Pertinent positives mentioned in HPI. Remainder of all ROS negative.   Physical Exam:  Vitals:   11/14/17 1602  BP: (!) 169/89    Pulse: 89  Temp: 98 F (36.7 C)  TempSrc: Oral  SpO2: 96%  Weight: 209 lb 3.2 oz (94.9 kg)  Height: 6' (1.829 m)   Physical Exam  Constitutional: He is oriented to person, place, and time. He appears well-developed and well-nourished. No distress.  HENT:  Head: Normocephalic and atraumatic.  Eyes: Right eye exhibits no discharge. Left eye exhibits no discharge.  Cardiovascular: Normal rate, regular rhythm and intact distal pulses.  Pulmonary/Chest: Effort normal. He has no wheezes. He has rales.  Mild bibasilar rales   Abdominal: Soft. Bowel sounds are normal. He exhibits no distension. There is no tenderness.  Musculoskeletal: He exhibits no edema.  Neurological: He is alert and oriented to person, place, and time.  Skin: Skin is warm and dry.    Assessment & Plan:   See Encounters Tab for problem based charting.  Patient discussed with Dr. Angelia Mould

## 2017-11-14 NOTE — Telephone Encounter (Signed)
Patient was seen at the heart doctor and they was going to put a NSTEMI but there is a blockage and he is having open heart surgery. He is was asking them about getting a breathing machine he have trouble breathing, they told him to contact the PCP.

## 2017-11-14 NOTE — Assessment & Plan Note (Signed)
Blood pressure 169/89 in the setting of missing dialysis yesterday.  Patient is currently on carvedilol and Imdur.  Plan -Continue carvedilol and Imdur -Advised him to go to dialysis tomorrow morning

## 2017-11-14 NOTE — Telephone Encounter (Signed)
Patient is having 2 L of home oxygen for use with ambulation ordered by Dr. Marlowe Sax following his [facility] visit today.

## 2017-11-14 NOTE — Patient Instructions (Addendum)
We will arrange an oxygen tank for you

## 2017-11-15 DIAGNOSIS — N186 End stage renal disease: Secondary | ICD-10-CM | POA: Diagnosis not present

## 2017-11-15 DIAGNOSIS — D509 Iron deficiency anemia, unspecified: Secondary | ICD-10-CM | POA: Diagnosis not present

## 2017-11-15 DIAGNOSIS — E1129 Type 2 diabetes mellitus with other diabetic kidney complication: Secondary | ICD-10-CM | POA: Diagnosis not present

## 2017-11-15 DIAGNOSIS — D631 Anemia in chronic kidney disease: Secondary | ICD-10-CM | POA: Diagnosis not present

## 2017-11-15 DIAGNOSIS — N2581 Secondary hyperparathyroidism of renal origin: Secondary | ICD-10-CM | POA: Diagnosis not present

## 2017-11-18 DIAGNOSIS — N186 End stage renal disease: Secondary | ICD-10-CM | POA: Diagnosis not present

## 2017-11-18 DIAGNOSIS — N2581 Secondary hyperparathyroidism of renal origin: Secondary | ICD-10-CM | POA: Diagnosis not present

## 2017-11-18 DIAGNOSIS — E1129 Type 2 diabetes mellitus with other diabetic kidney complication: Secondary | ICD-10-CM | POA: Diagnosis not present

## 2017-11-18 DIAGNOSIS — D509 Iron deficiency anemia, unspecified: Secondary | ICD-10-CM | POA: Diagnosis not present

## 2017-11-18 DIAGNOSIS — D631 Anemia in chronic kidney disease: Secondary | ICD-10-CM | POA: Diagnosis not present

## 2017-11-20 DIAGNOSIS — E1129 Type 2 diabetes mellitus with other diabetic kidney complication: Secondary | ICD-10-CM | POA: Diagnosis not present

## 2017-11-20 DIAGNOSIS — D509 Iron deficiency anemia, unspecified: Secondary | ICD-10-CM | POA: Diagnosis not present

## 2017-11-20 DIAGNOSIS — N186 End stage renal disease: Secondary | ICD-10-CM | POA: Diagnosis not present

## 2017-11-20 DIAGNOSIS — D631 Anemia in chronic kidney disease: Secondary | ICD-10-CM | POA: Diagnosis not present

## 2017-11-20 DIAGNOSIS — N2581 Secondary hyperparathyroidism of renal origin: Secondary | ICD-10-CM | POA: Diagnosis not present

## 2017-11-22 DIAGNOSIS — D509 Iron deficiency anemia, unspecified: Secondary | ICD-10-CM | POA: Diagnosis not present

## 2017-11-22 DIAGNOSIS — N186 End stage renal disease: Secondary | ICD-10-CM | POA: Diagnosis not present

## 2017-11-22 DIAGNOSIS — N2581 Secondary hyperparathyroidism of renal origin: Secondary | ICD-10-CM | POA: Diagnosis not present

## 2017-11-22 DIAGNOSIS — E1129 Type 2 diabetes mellitus with other diabetic kidney complication: Secondary | ICD-10-CM | POA: Diagnosis not present

## 2017-11-22 DIAGNOSIS — D631 Anemia in chronic kidney disease: Secondary | ICD-10-CM | POA: Diagnosis not present

## 2017-11-24 NOTE — Progress Notes (Signed)
Internal Medicine Clinic Attending  Case discussed with Dr. Rathoreat the time of the visit. We reviewed the resident's history and exam and pertinent patient test results. I agree with the assessment, diagnosis, and plan of care documented in the resident's note.  

## 2017-11-25 DIAGNOSIS — E1129 Type 2 diabetes mellitus with other diabetic kidney complication: Secondary | ICD-10-CM | POA: Diagnosis not present

## 2017-11-25 DIAGNOSIS — D509 Iron deficiency anemia, unspecified: Secondary | ICD-10-CM | POA: Diagnosis not present

## 2017-11-25 DIAGNOSIS — N2581 Secondary hyperparathyroidism of renal origin: Secondary | ICD-10-CM | POA: Diagnosis not present

## 2017-11-25 DIAGNOSIS — N186 End stage renal disease: Secondary | ICD-10-CM | POA: Diagnosis not present

## 2017-11-25 DIAGNOSIS — D631 Anemia in chronic kidney disease: Secondary | ICD-10-CM | POA: Diagnosis not present

## 2017-11-26 ENCOUNTER — Ambulatory Visit: Payer: Medicare Other

## 2017-11-27 ENCOUNTER — Ambulatory Visit (INDEPENDENT_AMBULATORY_CARE_PROVIDER_SITE_OTHER): Payer: Medicare Other | Admitting: Physician Assistant

## 2017-11-27 VITALS — BP 145/80 | HR 80 | Temp 97.7°F | Resp 20 | Ht 73.0 in | Wt 204.7 lb

## 2017-11-27 DIAGNOSIS — N186 End stage renal disease: Secondary | ICD-10-CM

## 2017-11-27 DIAGNOSIS — D631 Anemia in chronic kidney disease: Secondary | ICD-10-CM | POA: Diagnosis not present

## 2017-11-27 DIAGNOSIS — Z992 Dependence on renal dialysis: Secondary | ICD-10-CM

## 2017-11-27 DIAGNOSIS — D509 Iron deficiency anemia, unspecified: Secondary | ICD-10-CM | POA: Diagnosis not present

## 2017-11-27 DIAGNOSIS — I2510 Atherosclerotic heart disease of native coronary artery without angina pectoris: Secondary | ICD-10-CM

## 2017-11-27 DIAGNOSIS — T82898D Other specified complication of vascular prosthetic devices, implants and grafts, subsequent encounter: Secondary | ICD-10-CM | POA: Diagnosis not present

## 2017-11-27 DIAGNOSIS — Z9861 Coronary angioplasty status: Secondary | ICD-10-CM

## 2017-11-27 DIAGNOSIS — E1129 Type 2 diabetes mellitus with other diabetic kidney complication: Secondary | ICD-10-CM | POA: Diagnosis not present

## 2017-11-27 DIAGNOSIS — N2581 Secondary hyperparathyroidism of renal origin: Secondary | ICD-10-CM | POA: Diagnosis not present

## 2017-11-27 NOTE — Progress Notes (Signed)
HISTORY AND PHYSICAL     CC:  Enlarging AVF aneurysm Requesting Provider:  Neva Seat, MD  HPI: This is a 62 y.o. male who underwent a plication of the LUA AVF on 07/30/17 by Dr. Scot Dock.  He had a f/u appointment on 08/02/17 but did not show.  He dialyzes M/W/F.   He dialyzes M/W/F.  When Dr. 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  Dr. Scot Dock felt that the fistula was calcified and this may make revision difficult.  He has done well with the revision of the distal aneurysm and he states that they are using his fistula without difficulty.  He states that there are no ulcers or areas that have bled.    His original left BC AVF was place ~ 10 years ago in [place].    He states that he has been worked up for his chest pain and he is going to need open heart surgery for a bypass and a leaky valve.  He goes tomorrow for scheduling of this.  He states that he does use oxygen at home and has had some shortness of breath.    According to outside records, he underwent cardiac catheterization on 11/13/17 at [facility] and was found to have severe MR, CAD with 80% stenosis of the mid LAD at the bifurcation of a moderate sized diagonal, which has a 90% ostial stenosis.  He had anterolateral wall hypokinesis with mild reduction in LV systolic function.  A CT surgery consult for CABG/MVR was initiated.   The pt is on a statin for cholesterol management.  He is on a beta blocker and CCB for blood pressure management.  He is on a daily aspirin.    Past Medical History:  Diagnosis Date  . Anemia   . Anginal pain ([place])   . CAD (coronary artery disease)    a. per [facility] s/p 3.89mm x 69mm Vision BMS to mid LAD 12/2009 and Xience DES to mid LAD 10/2010.  [name] Chronic diastolic CHF (congestive heart failure) ([place])   . Colon polyps   . Daily headache   . 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 GERD  (gastroesophageal reflux disease)   . Heart murmur   . Hematochezia    a. 2014: colonscopy, which showed moderately-sized internal hemorrhoids, two 67mm polyps in transverse colon and ascending colon that were resected, five 2-30mm polyps in sigmoid colon, descending colon, transverse colon, and ascending colon that were resected. An upper endoscopy was performed and showed normal esophagus, stomach, and duodenum.  . Hematuria    a. H/o hematuria 2014 with cystoscopy that was unrevealing for his source of hematuria. He underwent a kidney ultrasound on 10/14 that showed mildly echogenic and scarred kidneys compatible with medical renal disease, without hydronephrosis or renal calculi.  [name] History of blood transfusion    "had colonoscopy done; they had to give me some blood"  . Hyperlipidemia   . Hypertension   . On home oxygen therapy    "2L prn" (07/21/2015); "been off it for awhile" (03/04/2017)  . Renal insufficiency   . 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 Type II diabetes mellitus ([place])   . Wears dentures     Past Surgical History:  Procedure Laterality Date  . AV FISTULA PLACEMENT Left ~ 2007   "upper arm"  . CARDIAC  CATHETERIZATION  "several"  . CORONARY ANGIOPLASTY WITH STENT PLACEMENT  "several"  . CYSTOSCOPY W/ STONE MANIPULATION  X2?  . EYE SURGERY Bilateral    "laser OR for hemorrhage"  . LEFT HEART CATHETERIZATION WITH CORONARY ANGIOGRAM N/A 11/23/2014   Procedure: LEFT HEART CATHETERIZATION WITH CORONARY ANGIOGRAM;  Surgeon: Troy Sine, MD;  Location: [facility] CATH LAB;  Service: Cardiovascular;  Laterality: N/A;  . LITHOTRIPSY  X1  . REVISON OF ARTERIOVENOUS FISTULA Left 38/[id number]/6433   Procedure: PLICATION OF LEFT ARM ARTERIOVENOUS FISTULA;  Surgeon: Angelia Mould, MD;  Location: [facility];  Service: Vascular;  Laterality: Left;    Allergies  Allergen Reactions  . Enalapril Hives and Rash  . Latex Rash  . Tape Rash    TAPE MAKES SKIN  BREAK OUT AND TURN RED    Current Outpatient Medications  Medication Sig Dispense Refill  . albuterol (PROVENTIL HFA;VENTOLIN HFA) 108 (90 Base) MCG/ACT inhaler Inhale 1-2 puffs into the lungs every 6 (six) hours as needed for wheezing or shortness of breath. 1 Inhaler 0  . amLODipine (NORVASC) 10 MG tablet Take 10 mg by mouth at bedtime.  3  . aspirin 81 MG chewable tablet Chew 1 tablet (81 mg total) by mouth daily. (Patient taking differently: Chew 81 mg by mouth at bedtime. )    . atorvastatin (LIPITOR) 20 MG tablet Take 20 mg by mouth at bedtime. Reported on 03/14/2016    . carvedilol (COREG) 25 MG tablet Take 1 tablet (25 mg total) by mouth See admin instructions. Take 1 tablet (25 mg) by mouth daily at bedtime, take 1 tablet (25 mg) on Sunday, Tuesday, Thursday, Saturday mornings 30 tablet 1  . cinacalcet (SENSIPAR) 90 MG tablet Take 180 mg by mouth every Monday, Wednesday, and Friday with hemodialysis.     [name] isosorbide mononitrate (IMDUR) 30 MG 24 hr tablet Take 1 tablet (30 mg total) by mouth daily. 30 tablet 11  . metoCLOPramide (REGLAN) 5 MG tablet Take 1 tablet (5 mg total) by mouth 3 (three) times daily as needed for nausea. 90 tablet 1  . multivitamin (RENA-VIT) TABS tablet Take 1 tablet by mouth at bedtime. 30 tablet 0  . nitroGLYCERIN (NITROSTAT) 0.4 MG SL tablet Place 0.4 mg under the tongue every 5 (five) minutes as needed for chest pain.    . sevelamer carbonate (RENVELA) 800 MG tablet Take 4 tablets (3,200 mg total) by mouth 3 (three) times daily with meals. (Patient taking differently: Take 2,400-3,200 mg by mouth See admin instructions. Take 4 tablets (3200 mg) by mouth three times daily with meals and 3 tablets (2400 mg) with snacks) 320 tablet 0   No current facility-administered medications for this visit.     Family History  Problem Relation Age of Onset  . Bone cancer Mother   . Anuerysm Father   . Hypertension Unknown   . Diabetes type II Daughter     Social  History   Socioeconomic History  . Marital status: Married    Spouse name: Not on file  . Number of children: Not on file  . Years of education: Not on file  . Highest education level: Not on file  Social Needs  . Financial resource strain: Not on file  . Food insecurity - worry: Not on file  . Food insecurity - inability: Not on file  . Transportation needs - medical: Not on file  . Transportation needs - non-medical: Not on file  Occupational History  . Not on file  Tobacco Use  . Smoking status: Current Some Day Smoker    Packs/day: 0.50    Years: 8.00    Pack years: 4.00    Types: Cigarettes  . Smokeless tobacco: Never Used  . Tobacco comment: 3-4 per day.  wants patch  Substance and Sexual Activity  . Alcohol use: No    Alcohol/week: 0.0 oz  . Drug use: No  . Sexual activity: Not Currently  Other Topics Concern  . Not on file  Social History Narrative   Moved from [place], [place] to [place] in 11/2014.      Lives with fiancee      Quit smoking 02/2017     REVIEW OF SYSTEMS:   [X]  denotes positive finding, [ ]  denotes negative finding Cardiac  Comments:  Chest pain or chest pressure: x   Shortness of breath upon exertion: x   Short of breath when lying flat:    Irregular heart rhythm:        Vascular    Pain in calf, thigh, or hip brought on by ambulation:  denies  Pain in feet at night that wakes you up from your sleep:  x   Blood clot in your veins:    Leg swelling:         Pulmonary    Oxygen at home: x   Productive cough:     Wheezing:         Neurologic    Sudden weakness in arms or legs:     Sudden numbness in arms or legs:     Sudden onset of difficulty speaking or slurred speech:    Temporary loss of vision in one eye:     Problems with dizziness:         Gastrointestinal    Blood in stool:     Vomited blood:         Genitourinary    Burning when urinating:     Blood in urine:        Psychiatric    Major depression:           Hematologic    Bleeding problems:    Problems with blood clotting too easily:        Skin    Rashes or ulcers:        Constitutional    Fever or chills:      PHYSICAL EXAMINATION:  Vitals:   11/27/17 1327  BP: (!) 145/80  Pulse: 80  Resp: 20  Temp: 97.7 F (36.5 C)  SpO2: 97%   Vitals:   11/27/17 1327  Weight: 204 lb 11.2 oz (92.9 kg)  Height: 6\' 1"  (1.854 m)   Body mass index is 27.01 kg/m.   General:  WDWN in NAD; vital signs documented above Gait: Normal HENT: WNL, normocephalic Pulmonary: normal non-labored breathing , without Rales, rhonchi,  wheezing Cardiac: regular HR, with Murmur without carotid bruits Skin: without rashes Vascular Exam/Pulses:  Right Left  Radial 2+ (normal) 2+ (normal)  Ulnar Unable to palpate  Unable to palpate    Extremities: incision from plication of left arm AVF has healed nicely.  He has a moderate aneurysmal area midway up the fistula.  There is no ulceration of thinning of the skin. There is an excellent thrill in the fistula.  Musculoskeletal: no muscle wasting or atrophy  Neurologic: A&O X 3;  No focal weakness or paresthesias are detected Psychiatric:  The pt has Normal affect.   Non-Invasive Vascular Imaging:   none  Pt meds includes: Statin:  Yes.   Beta Blocker:  Yes.   Aspirin:  Yes.   ACEI:  No. ARB:  No. CCB use:  Yes Other Antiplatelet/Anticoagulant:  No   ASSESSMENT/PLAN:: 62 y.o. male with ESRD who dialyzes on M/W/F presents today for evaluation of aneurysmal area of left arm fistula.    -pt has recovered and healed nicely from the plication of the distal aneurysm of the left arm fistula and he states his fistula is working well.  He does have a moderate aneurysm midway up the upper arm that has not had any ulcerations nor has he had any issues with bleeding.  The skin does not appear thinned out.  -he is going to [facility] tomorrow for consultation for CABG/MVR for recent findings on cardiac  catheterization on 11/13/17.  Discussed with pt given his fistula does not have any ulcerations or had any issues with bleeding and the skin has not thinned out, would favor waiting until he has recovered from his heart surgery before proceeding with plication of fistula.  Discussed with Dr. Scot Dock and he is in agreement.  Pt expresses understanding.   -will schedule him to f/u with Dr. Scot Dock in a couple of months to re-visit plication of the left upper arm fistula.  He will contact us sooner if he has any issues.    Leontine Locket, PA-C [facility] [phone]  Clinic MD:  Scot Dock

## 2017-11-28 DIAGNOSIS — I5042 Chronic combined systolic (congestive) and diastolic (congestive) heart failure: Secondary | ICD-10-CM | POA: Diagnosis not present

## 2017-11-28 DIAGNOSIS — Z888 Allergy status to other drugs, medicaments and biological substances status: Secondary | ICD-10-CM | POA: Diagnosis not present

## 2017-11-28 DIAGNOSIS — N186 End stage renal disease: Secondary | ICD-10-CM | POA: Diagnosis not present

## 2017-11-28 DIAGNOSIS — E785 Hyperlipidemia, unspecified: Secondary | ICD-10-CM | POA: Diagnosis not present

## 2017-11-28 DIAGNOSIS — I132 Hypertensive heart and chronic kidney disease with heart failure and with stage 5 chronic kidney disease, or end stage renal disease: Secondary | ICD-10-CM | POA: Diagnosis not present

## 2017-11-28 DIAGNOSIS — I700 Atherosclerosis of aorta: Secondary | ICD-10-CM | POA: Diagnosis not present

## 2017-11-28 DIAGNOSIS — Z79899 Other long term (current) drug therapy: Secondary | ICD-10-CM | POA: Diagnosis not present

## 2017-11-28 DIAGNOSIS — Z992 Dependence on renal dialysis: Secondary | ICD-10-CM | POA: Diagnosis not present

## 2017-11-28 DIAGNOSIS — Z01810 Encounter for preprocedural cardiovascular examination: Secondary | ICD-10-CM | POA: Diagnosis not present

## 2017-11-28 DIAGNOSIS — Z955 Presence of coronary angioplasty implant and graft: Secondary | ICD-10-CM | POA: Diagnosis not present

## 2017-11-28 DIAGNOSIS — I517 Cardiomegaly: Secondary | ICD-10-CM | POA: Diagnosis not present

## 2017-11-28 DIAGNOSIS — E1122 Type 2 diabetes mellitus with diabetic chronic kidney disease: Secondary | ICD-10-CM | POA: Diagnosis not present

## 2017-11-28 DIAGNOSIS — I340 Nonrheumatic mitral (valve) insufficiency: Secondary | ICD-10-CM | POA: Diagnosis not present

## 2017-11-28 DIAGNOSIS — I2510 Atherosclerotic heart disease of native coronary artery without angina pectoris: Secondary | ICD-10-CM | POA: Diagnosis not present

## 2017-11-28 DIAGNOSIS — K219 Gastro-esophageal reflux disease without esophagitis: Secondary | ICD-10-CM | POA: Diagnosis not present

## 2017-11-28 DIAGNOSIS — Z91040 Latex allergy status: Secondary | ICD-10-CM | POA: Diagnosis not present

## 2017-11-28 DIAGNOSIS — Z7982 Long term (current) use of aspirin: Secondary | ICD-10-CM | POA: Diagnosis not present

## 2017-11-28 DIAGNOSIS — Z87891 Personal history of nicotine dependence: Secondary | ICD-10-CM | POA: Diagnosis not present

## 2017-11-29 DIAGNOSIS — N2581 Secondary hyperparathyroidism of renal origin: Secondary | ICD-10-CM | POA: Diagnosis not present

## 2017-11-29 DIAGNOSIS — N186 End stage renal disease: Secondary | ICD-10-CM | POA: Diagnosis not present

## 2017-11-29 DIAGNOSIS — D631 Anemia in chronic kidney disease: Secondary | ICD-10-CM | POA: Diagnosis not present

## 2017-11-29 DIAGNOSIS — D509 Iron deficiency anemia, unspecified: Secondary | ICD-10-CM | POA: Diagnosis not present

## 2017-11-29 DIAGNOSIS — E1129 Type 2 diabetes mellitus with other diabetic kidney complication: Secondary | ICD-10-CM | POA: Diagnosis not present

## 2017-11-30 ENCOUNTER — Encounter (HOSPITAL_COMMUNITY): Payer: Self-pay | Admitting: Emergency Medicine

## 2017-11-30 ENCOUNTER — Other Ambulatory Visit: Payer: Self-pay

## 2017-11-30 ENCOUNTER — Emergency Department (HOSPITAL_COMMUNITY)
Admission: EM | Admit: 2017-11-30 | Discharge: 2017-12-01 | Disposition: A | Discharge status: Home / Self Care | Payer: Medicare Other | Attending: Emergency Medicine | Admitting: Emergency Medicine

## 2017-11-30 ENCOUNTER — Emergency Department (HOSPITAL_COMMUNITY): Payer: Medicare Other

## 2017-11-30 DIAGNOSIS — E1122 Type 2 diabetes mellitus with diabetic chronic kidney disease: Secondary | ICD-10-CM | POA: Diagnosis not present

## 2017-11-30 DIAGNOSIS — Z91040 Latex allergy status: Secondary | ICD-10-CM | POA: Diagnosis not present

## 2017-11-30 DIAGNOSIS — I5032 Chronic diastolic (congestive) heart failure: Secondary | ICD-10-CM | POA: Diagnosis not present

## 2017-11-30 DIAGNOSIS — Z992 Dependence on renal dialysis: Secondary | ICD-10-CM | POA: Diagnosis not present

## 2017-11-30 DIAGNOSIS — F17210 Nicotine dependence, cigarettes, uncomplicated: Secondary | ICD-10-CM | POA: Diagnosis not present

## 2017-11-30 DIAGNOSIS — N186 End stage renal disease: Secondary | ICD-10-CM | POA: Diagnosis not present

## 2017-11-30 DIAGNOSIS — Z7982 Long term (current) use of aspirin: Secondary | ICD-10-CM | POA: Diagnosis not present

## 2017-11-30 DIAGNOSIS — R0789 Other chest pain: Secondary | ICD-10-CM | POA: Diagnosis not present

## 2017-11-30 DIAGNOSIS — Z79899 Other long term (current) drug therapy: Secondary | ICD-10-CM | POA: Diagnosis not present

## 2017-11-30 DIAGNOSIS — I2510 Atherosclerotic heart disease of native coronary artery without angina pectoris: Secondary | ICD-10-CM | POA: Diagnosis not present

## 2017-11-30 DIAGNOSIS — R079 Chest pain, unspecified: Secondary | ICD-10-CM

## 2017-11-30 DIAGNOSIS — I132 Hypertensive heart and chronic kidney disease with heart failure and with stage 5 chronic kidney disease, or end stage renal disease: Secondary | ICD-10-CM | POA: Insufficient documentation

## 2017-11-30 LAB — CBC
HCT: 31.3 % — ABNORMAL LOW (ref 39.0–52.0)
Hemoglobin: 9.7 g/dL — ABNORMAL LOW (ref 13.0–17.0)
MCH: 27.7 pg (ref 26.0–34.0)
MCHC: 31 g/dL (ref 30.0–36.0)
MCV: 89.4 fL (ref 78.0–100.0)
PLATELETS: 154 10*3/uL (ref 150–400)
RBC: 3.5 MIL/uL — AB (ref 4.22–5.81)
RDW: 15.3 % (ref 11.5–15.5)
WBC: 5.9 10*3/uL (ref 4.0–10.5)

## 2017-11-30 LAB — BASIC METABOLIC PANEL
Anion gap: 15 (ref 5–15)
BUN: 33 mg/dL — ABNORMAL HIGH (ref 6–20)
CALCIUM: 8.8 mg/dL — AB (ref 8.9–10.3)
CO2: 28 mmol/L (ref 22–32)
CREATININE: 9.07 mg/dL — AB (ref 0.61–1.24)
Chloride: 95 mmol/L — ABNORMAL LOW (ref 101–111)
GFR calc non Af Amer: 6 mL/min — ABNORMAL LOW (ref >60)
GFR, EST AFRICAN AMERICAN: 6 mL/min — AB (ref >60)
Glucose, Bld: 191 mg/dL — ABNORMAL HIGH (ref 65–99)
Potassium: 4.1 mmol/L (ref 3.5–5.1)
Sodium: 138 mmol/L (ref 135–145)

## 2017-11-30 LAB — I-STAT TROPONIN, ED: TROPONIN I, POC: 0 ng/mL (ref 0.00–0.08)

## 2017-11-30 NOTE — ED Triage Notes (Signed)
Pt c/o 4/10 bilateral cp for the past few hours not getting relief, pt denies any SOB, nausea or vomiting.

## 2017-12-01 DIAGNOSIS — R0789 Other chest pain: Secondary | ICD-10-CM | POA: Diagnosis not present

## 2017-12-01 LAB — I-STAT TROPONIN, ED: Troponin i, poc: 0.01 ng/mL (ref 0.00–0.08)

## 2017-12-01 MED ORDER — ASPIRIN 81 MG PO CHEW: 324.0000 mg | CHEWABLE_TABLET | Freq: Once | ORAL | Status: DC

## 2017-12-01 NOTE — ED Notes (Signed)
Pt departed in NAD, refused use of wheelchair.  

## 2017-12-01 NOTE — Discharge Instructions (Signed)
1. Medications: usual home medications 2. Treatment: rest, drink plenty of fluids,  3. Follow Up: Please followup with your primary doctor in 3-5 days for discussion of your diagnoses and further evaluation after today's visit; if you do not have a primary care doctor use the resource guide provided to find one; Please return to the ER for chest pain that does not resolve after taking nitroglycerin

## 2017-12-01 NOTE — ED Provider Notes (Signed)
[facility] EMERGENCY DEPARTMENT Provider Note   CSN: [id number] Arrival date & time: 11/30/17  1934     History   Chief Complaint Chief Complaint  Patient presents with  . Chest Pain    HPI [name] [name] is a 62 y.o. male with a hx of anemia, CAD (history of x2 prior PCI to LAD (2011 and 2012) and moderate MR), angina, ESRD on dialysis (MWF), GERD, HTN, NIDDM, TIA presents to the Emergency Department complaining of gradual, persistent, progressively worsening bilateral chest pain onset yesterday afternoon after completing dialysis last night.  Pt reports he took 2 additional nitro with moderate relief.  He reports going to bed with mild chest pain.  Pt reports no CP this morning, but he did take 2 nitro so that he would not develop CP.  Pt reports he was able to ambulate around the house without CP or SOB.  Pain returned about 5:30pm and lasted several seconds resolving quickly and not returning.  He reports he did take his isosorbide this evening.  Pt reports the pain was at a 4/10.  Pt reports this chest pain is the same as all his previous episodes.  Pt reports he took 324 mg ASA this morning.  Pt denies fever, chills, cough, congestions, SOB, nausea, vomiting, diarrhea, weakness, dizziness.   Last dialysis Yesterday.  No hx of cardiac cath.  Pt is followed by [facility] CT surgery for SOB at rest and intermittent angina. He takes SL nitro and imdur for his angina. He is using oxygen 2L via [place] at home.    Outside records reviewed and show that he underwent cardiac catheterization on 11/13/17 at [facility] and was found to have severe MR, CAD with 80% stenosis of the mid LAD at the bifurcation of a moderate sized diagonal, which has a 90% ostial stenosis.  He had anterolateral wall hypokinesis with mild reduction in LV systolic function.     The history is provided by the patient and medical records. No language interpreter was used.    Past Medical History:  Diagnosis Date  . Anemia     . Anginal pain ([place])   . CAD (coronary artery disease)    a. per [facility] s/p 3.17mm x 40mm [facility] BMS to mid LAD 12/2009 and Xience DES to mid LAD 10/2010.  [name] Chronic diastolic CHF (congestive heart failure) ([place])   . Colon polyps   . Daily headache   . ESRD on dialysis [facility]) since ~ 2008   "MWF; Jeneen Rinks" (03/04/2017)  . GERD (gastroesophageal reflux disease)   . Heart murmur   . Hematochezia    a. 2014: colonscopy, which showed moderately-sized internal hemorrhoids, two 74mm polyps in transverse colon and ascending colon that were resected, five 2-67mm polyps in sigmoid colon, descending colon, transverse colon, and ascending colon that were resected. An upper endoscopy was performed and showed normal esophagus, stomach, and duodenum.  . Hematuria    a. H/o hematuria 2014 with cystoscopy that was unrevealing for his source of hematuria. He underwent a kidney ultrasound on 10/14 that showed mildly echogenic and scarred kidneys compatible with medical renal disease, without hydronephrosis or renal calculi.  [name] History of blood transfusion    "had colonoscopy done; they had to give me some blood"  . Hyperlipidemia   . Hypertension   . On home oxygen therapy    "2L prn" (07/21/2015); "been off it for awhile" (03/04/2017)  . Renal insufficiency   . 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 Type II diabetes mellitus ([place])   . Wears dentures     Patient Active Problem List   Diagnosis Date Noted  . Tobacco use 10/29/2017  . Mild cognitive impairment 06/13/2017  . Chest pain 06/07/2017  . Skin ulcer of toe of right foot, limited to breakdown of skin ([place])   . Callus of foot 03/07/2017  . Nausea and vomiting 04/02/2016  . ESRD (end stage renal disease) ([place]) 03/18/2016  . Mitral regurgitation   . Diabetic neuropathy ([place]) 07/29/2015  . Depression 07/22/2015  . GERD (gastroesophageal reflux disease) 06/04/2015  . Malnutrition of moderate degree ([facility])  05/17/2015  . Thrombocytopenia ([place]) 05/16/2015  . Weight loss 05/16/2015  . H/O TIA (transient ischemic attack) 04/01/2015  . Diastolic dysfunction-grade 2 12/13/2014  . Hypertension 04/27/2014  . CAD -S/P LAD BMS 2011, LAD DES 2012- patent cors Feb 2016 04/27/2014  . Diabetes mellitus type 2, diet-controlled ([place]) 04/27/2014    Past Surgical History:  Procedure Laterality Date  . AV FISTULA PLACEMENT Left ~ 2007   "upper arm"  . CARDIAC CATHETERIZATION  "several"  . CORONARY ANGIOPLASTY WITH STENT PLACEMENT  "several"  . CYSTOSCOPY W/ STONE MANIPULATION  X2?  . EYE SURGERY Bilateral    "laser OR for hemorrhage"  . LEFT HEART CATHETERIZATION WITH CORONARY ANGIOGRAM N/A 11/23/2014   Procedure: LEFT HEART CATHETERIZATION WITH CORONARY ANGIOGRAM;  Surgeon: Troy Sine, MD;  Location[facility] CATH LAB;  Service: Cardiovascular;  Laterality: N/A;  . LITHOTRIPSY  X1  . REVISON OF ARTERIOVENOUS FISTULA Left 16/07/9603   Procedure: PLICATION OF LEFT ARM ARTERIOVENOUS FISTULA;  Surgeon: Angelia Mould, MD;  Location: [facility];  Service: Vascular;  Laterality: Left;       Home Medications    Prior to Admission medications   Medication Sig Start Date End Date Taking? Authorizing Provider  aspirin 81 MG chewable tablet Chew 1 tablet (81 mg total) by mouth daily. Patient taking differently: Chew 81 mg by mouth at bedtime.  07/22/15  Yes Iline Oven, MD  atorvastatin (LIPITOR) 20 MG tablet Take 20 mg by mouth at bedtime. Reported on 03/14/2016   Yes [provider]  carvedilol (COREG) 25 MG tablet Take 1 tablet (25 mg total) by mouth See admin instructions. Take 1 tablet (25 mg) by mouth daily at bedtime, take 1 tablet (25 mg) on Sunday, Tuesday, Thursday, Saturday mornings 10/09/17  Yes Molt, Bethany, DO  cinacalcet (SENSIPAR) 90 MG tablet Take 180 mg by mouth every Monday, Wednesday, and Friday with hemodialysis.    Yes [provider]  isosorbide mononitrate (IMDUR)  30 MG 24 hr tablet Take 1 tablet (30 mg total) by mouth daily. 10/29/17 10/29/18 Yes Alphonzo Grieve, MD  metoCLOPramide (REGLAN) 5 MG tablet Take 1 tablet (5 mg total) by mouth 3 (three) times daily as needed for nausea. 10/29/17 10/29/18 Yes Alphonzo Grieve, MD  multivitamin (RENA-VIT) TABS tablet Take 1 tablet by mouth at bedtime. 12/15/14  Yes Geradine Girt, DO  nitroGLYCERIN (NITROSTAT) 0.4 MG SL tablet Place 0.4 mg under the tongue every 5 (five) minutes as needed for chest pain.   Yes [provider]  sevelamer carbonate (RENVELA) 800 MG tablet Take 4 tablets (3,200 mg total) by mouth 3 (three) times daily with meals. Patient taking differently: Take 2,400-3,200 mg by mouth See admin instructions. Take 4 tablets (3200 mg) by mouth three times daily with meals and 3 tablets (2400 mg) with  snacks 03/12/16  Yes Vann, Jessica U, DO  albuterol (PROVENTIL HFA;VENTOLIN HFA) 108 (90 Base) MCG/ACT inhaler Inhale 1-2 puffs into the lungs every 6 (six) hours as needed for wheezing or shortness of breath. Patient not taking: Reported on 12/01/2017 01/19/16   Delsa Grana, PA-C    Family History Family History  Problem Relation Age of Onset  . Bone cancer Mother   . Anuerysm Father   . Hypertension Unknown   . Diabetes type II Daughter     Social History Social History   Tobacco Use  . Smoking status: Current Some Day Smoker    Packs/day: 0.50    Years: 8.00    Pack years: 4.00    Types: Cigarettes  . Smokeless tobacco: Never Used  . Tobacco comment: 3-4 per day.  wants patch  Substance Use Topics  . Alcohol use: No    Alcohol/week: 0.0 oz  . Drug use: No     Allergies   Enalapril; Latex; and Tape   Review of Systems Review of Systems  Constitutional: Negative for appetite change, diaphoresis, fatigue, fever and unexpected weight change.  HENT: Negative for mouth sores.   Eyes: Negative for visual disturbance.  Respiratory: Negative for cough, chest tightness, shortness of  breath and wheezing.   Cardiovascular: Positive for chest pain.  Gastrointestinal: Negative for abdominal pain, constipation, diarrhea, nausea and vomiting.  Endocrine: Negative for polydipsia, polyphagia and polyuria.  Genitourinary: Negative for dysuria, frequency, hematuria and urgency.  Musculoskeletal: Negative for back pain and neck stiffness.  Skin: Negative for rash.  Allergic/Immunologic: Negative for immunocompromised state.  Neurological: Negative for syncope, light-headedness and headaches.  Hematological: Does not bruise/bleed easily.  Psychiatric/Behavioral: Negative for sleep disturbance. The patient is not nervous/anxious.      Physical Exam Updated Vital Signs BP (!) 142/72 (BP Location: Right Arm)   Pulse 76   Temp 98.4 F (36.9 C) (Oral)   Resp 15   Ht 6\' 1"  (1.854 m)   Wt 92.5 kg (204 lb)   SpO2 100%   BMI 26.91 kg/m   Physical Exam  Constitutional: He appears well-developed and well-nourished. No distress.  Awake, alert, nontoxic appearance  HENT:  Head: Normocephalic and atraumatic.  Mouth/Throat: Oropharynx is clear and moist. No oropharyngeal exudate.  Eyes: Conjunctivae are normal. No scleral icterus.  Neck: Normal range of motion. Neck supple.  Cardiovascular: Normal rate, regular rhythm and intact distal pulses.  Pulses:      Radial pulses are 1+ on the right side, and 1+ on the left side.       Dorsalis pedis pulses are 1+ on the right side, and 1+ on the left side.  Pulmonary/Chest: Effort normal and breath sounds normal. No respiratory distress. He has no wheezes.  Equal chest expansion No rales or rhonchi  Abdominal: Soft. Bowel sounds are normal. He exhibits no mass. There is no tenderness. There is no rebound and no guarding.  Musculoskeletal: Normal range of motion. He exhibits no edema.       Right lower leg: He exhibits no edema.  Neurological: He is alert.  Speech is clear and goal oriented Moves extremities without ataxia  Skin:  Skin is warm and dry. He is not diaphoretic.  Psychiatric: He has a normal mood and affect.  Nursing note and vitals reviewed.    ED Treatments / Results  Labs (all labs ordered are listed, but only abnormal results are displayed) Labs Reviewed  BASIC METABOLIC PANEL - Abnormal; Notable for the following  components:      Result Value   Chloride 95 (*)    Glucose, Bld 191 (*)    BUN 33 (*)    Creatinine, Ser 9.07 (*)    Calcium 8.8 (*)    GFR calc non Af Amer 6 (*)    GFR calc Af Amer 6 (*)    All other components within normal limits  CBC - Abnormal; Notable for the following components:   RBC 3.50 (*)    Hemoglobin 9.7 (*)    HCT 31.3 (*)    All other components within normal limits  I-STAT TROPONIN, ED  I-STAT TROPONIN, ED    EKG  EKG Interpretation  Date/Time:  Saturday November 30 2017 19:38:59 EST Ventricular Rate:  88 PR Interval:  176 QRS Duration: 102 QT Interval:  384 QTC Calculation: 464 R Axis:   71 Text Interpretation:  Normal sinus rhythm Normal ECG No significant change from 11/08/2017 Confirmed by Veryl Speak [phone]) on 12/01/2017 1:51:32 AM       Radiology Dg Chest 2 View  Result Date: 11/30/2017 CLINICAL DATA:  Chest pain EXAM: CHEST  2 VIEW COMPARISON:  Chest radiograph 11/08/2017 FINDINGS: Diffuse interstitial opacities persist compared to 11/08/2017. Mild cardiomegaly, unchanged. No lobar consolidation. No pneumothorax or pleural effusion. IMPRESSION: Persistent bilateral diffuse interstitial opacity may indicate pulmonary edema or chronic fibrosis. Electronically Signed   By: Ulyses Jarred M.D.   On: 11/30/2017 20:14    Procedures Procedures (including critical care time)  Medications Ordered in ED Medications - No data to display   Initial Impression / Assessment and Plan / ED Course  I have reviewed the triage vital signs and the nursing notes.  Pertinent labs & imaging results that were available during my care of the patient were  reviewed by me and considered in my medical decision making (see chart for details).  Clinical Course as of Dec 01 228  Sun Dec 01, 2017  0005 Echo 03/12/16: Study Conclusions - Left ventricle: The cavity size was normal. There was moderate concentric hypertrophy. Systolic function was mildly reduced. The  estimated ejection fraction was in the range of 45% to 50%. Wall motion was normal; there were no regional wall motion abnormalities. - Mitral valve: Calcified annulus. Moderately thickened, severely calcified leaflets . The findings are consistent with mild stenosis. There was moderate regurgitation. - Left atrium: The atrium was severely dilated. - Right ventricle: The cavity size was moderately dilated. Wall thickness was normal. Systolic function was moderately reduced. - Right atrium: The atrium was mildly dilated. - Tricuspid valve: There was moderate-severe regurgitation. - Pulmonary arteries: Systolic pressure was within the normal range. - Inferior vena cava: The vessel was normal in size. - Pericardium, extracardiac: There was no pericardial effusion.  Impressions: - There is no significant difference from the prior study on 05/18/2015.  [HM]    Clinical Course User Index [HM] Ashante Yellin, Gwenlyn Perking    Patient here with chest pain after dialysis resolving last night and return of chest pain tonight which resolved spontaneously after several seconds.  Patient does have long cardiac history.  He has continued to take his isosorbide.  No shortness of breath or chest pain with exertion.  Initial and repeat troponin are negative.  EKG is without acute ischemic changes. Labs are otherwise reassuring and baseline for the patient.  No shortness of breath.  No evidence of fluid overload.  Discussed the importance of close cardiology follow-up.  Also informed the patient that he should  return to the emergency department if chest pain is persistent after nitroglycerin administration at  home.  Patient states understanding and is in agreement with this plan.  The patient was discussed with and seen by Dr. Stark Jock who agrees with the treatment plan.   Final Clinical Impressions(s) / ED Diagnoses   Final diagnoses:  Central chest pain    ED Discharge Orders    None       Loni Muse Gwenlyn Perking 12/01/17 0230    Veryl Speak, MD 12/01/17 [phone]

## 2017-12-02 DIAGNOSIS — D509 Iron deficiency anemia, unspecified: Secondary | ICD-10-CM | POA: Diagnosis not present

## 2017-12-02 DIAGNOSIS — N186 End stage renal disease: Secondary | ICD-10-CM | POA: Diagnosis not present

## 2017-12-02 DIAGNOSIS — D631 Anemia in chronic kidney disease: Secondary | ICD-10-CM | POA: Diagnosis not present

## 2017-12-02 DIAGNOSIS — E1129 Type 2 diabetes mellitus with other diabetic kidney complication: Secondary | ICD-10-CM | POA: Diagnosis not present

## 2017-12-02 DIAGNOSIS — N2581 Secondary hyperparathyroidism of renal origin: Secondary | ICD-10-CM | POA: Diagnosis not present

## 2017-12-04 DIAGNOSIS — N186 End stage renal disease: Secondary | ICD-10-CM | POA: Diagnosis not present

## 2017-12-04 DIAGNOSIS — N2581 Secondary hyperparathyroidism of renal origin: Secondary | ICD-10-CM | POA: Diagnosis not present

## 2017-12-04 DIAGNOSIS — D631 Anemia in chronic kidney disease: Secondary | ICD-10-CM | POA: Diagnosis not present

## 2017-12-04 DIAGNOSIS — D509 Iron deficiency anemia, unspecified: Secondary | ICD-10-CM | POA: Diagnosis not present

## 2017-12-04 DIAGNOSIS — E1129 Type 2 diabetes mellitus with other diabetic kidney complication: Secondary | ICD-10-CM | POA: Diagnosis not present

## 2017-12-06 DIAGNOSIS — D509 Iron deficiency anemia, unspecified: Secondary | ICD-10-CM | POA: Diagnosis not present

## 2017-12-06 DIAGNOSIS — E1122 Type 2 diabetes mellitus with diabetic chronic kidney disease: Secondary | ICD-10-CM | POA: Diagnosis not present

## 2017-12-06 DIAGNOSIS — Z992 Dependence on renal dialysis: Secondary | ICD-10-CM | POA: Diagnosis not present

## 2017-12-06 DIAGNOSIS — N186 End stage renal disease: Secondary | ICD-10-CM | POA: Diagnosis not present

## 2017-12-06 DIAGNOSIS — E1129 Type 2 diabetes mellitus with other diabetic kidney complication: Secondary | ICD-10-CM | POA: Diagnosis not present

## 2017-12-06 DIAGNOSIS — D631 Anemia in chronic kidney disease: Secondary | ICD-10-CM | POA: Diagnosis not present

## 2017-12-06 DIAGNOSIS — N2581 Secondary hyperparathyroidism of renal origin: Secondary | ICD-10-CM | POA: Diagnosis not present

## 2017-12-09 ENCOUNTER — Emergency Department (HOSPITAL_COMMUNITY)
Admission: EM | Admit: 2017-12-09 | Discharge: 2017-12-09 | Disposition: A | Discharge status: Home / Self Care | Payer: Medicare Other | Attending: Emergency Medicine | Admitting: Emergency Medicine

## 2017-12-09 ENCOUNTER — Other Ambulatory Visit: Payer: Self-pay

## 2017-12-09 ENCOUNTER — Emergency Department (HOSPITAL_COMMUNITY): Payer: Medicare Other

## 2017-12-09 ENCOUNTER — Encounter (HOSPITAL_COMMUNITY): Payer: Self-pay

## 2017-12-09 DIAGNOSIS — D631 Anemia in chronic kidney disease: Secondary | ICD-10-CM | POA: Diagnosis not present

## 2017-12-09 DIAGNOSIS — N2581 Secondary hyperparathyroidism of renal origin: Secondary | ICD-10-CM | POA: Diagnosis not present

## 2017-12-09 DIAGNOSIS — D509 Iron deficiency anemia, unspecified: Secondary | ICD-10-CM | POA: Diagnosis not present

## 2017-12-09 DIAGNOSIS — Z5321 Procedure and treatment not carried out due to patient leaving prior to being seen by health care provider: Secondary | ICD-10-CM | POA: Diagnosis not present

## 2017-12-09 DIAGNOSIS — E1129 Type 2 diabetes mellitus with other diabetic kidney complication: Secondary | ICD-10-CM | POA: Diagnosis not present

## 2017-12-09 DIAGNOSIS — R0602 Shortness of breath: Secondary | ICD-10-CM | POA: Diagnosis not present

## 2017-12-09 DIAGNOSIS — R079 Chest pain, unspecified: Secondary | ICD-10-CM | POA: Diagnosis not present

## 2017-12-09 DIAGNOSIS — N186 End stage renal disease: Secondary | ICD-10-CM | POA: Diagnosis not present

## 2017-12-09 LAB — BASIC METABOLIC PANEL
ANION GAP: 14 (ref 5–15)
BUN: 24 mg/dL — ABNORMAL HIGH (ref 6–20)
CHLORIDE: 95 mmol/L — AB (ref 101–111)
CO2: 30 mmol/L (ref 22–32)
Calcium: 8.4 mg/dL — ABNORMAL LOW (ref 8.9–10.3)
Creatinine, Ser: 6.21 mg/dL — ABNORMAL HIGH (ref 0.61–1.24)
GFR calc non Af Amer: 9 mL/min — ABNORMAL LOW (ref >60)
GFR, EST AFRICAN AMERICAN: 10 mL/min — AB (ref >60)
Glucose, Bld: 80 mg/dL (ref 65–99)
Potassium: 3.3 mmol/L — ABNORMAL LOW (ref 3.5–5.1)
Sodium: 139 mmol/L (ref 135–145)

## 2017-12-09 LAB — I-STAT TROPONIN, ED: Troponin i, poc: 0.02 ng/mL (ref 0.00–0.08)

## 2017-12-09 LAB — CBC
HCT: 29.8 % — ABNORMAL LOW (ref 39.0–52.0)
HEMOGLOBIN: 9.5 g/dL — AB (ref 13.0–17.0)
MCH: 27.9 pg (ref 26.0–34.0)
MCHC: 31.9 g/dL (ref 30.0–36.0)
MCV: 87.4 fL (ref 78.0–100.0)
Platelets: 148 10*3/uL — ABNORMAL LOW (ref 150–400)
RBC: 3.41 MIL/uL — AB (ref 4.22–5.81)
RDW: 16.1 % — ABNORMAL HIGH (ref 11.5–15.5)
WBC: 5.3 10*3/uL (ref 4.0–10.5)

## 2017-12-09 NOTE — ED Notes (Signed)
Pt informed this EMT that pt self-administered a nitroglycerin pill 7 minutes ago. RN notified

## 2017-12-09 NOTE — ED Triage Notes (Signed)
Patient reports ongoing intermittent CP x 1 year. Just finished dialysis this am and recurrent pain. To have CABG 3/14. Reports pain mild on arrival, alert and oriented

## 2017-12-09 NOTE — ED Notes (Signed)
Pt stated to staff that he does not want to go back to a room to see a doctor anymore he wants to leave and go to [facility].

## 2017-12-11 DIAGNOSIS — D631 Anemia in chronic kidney disease: Secondary | ICD-10-CM | POA: Diagnosis not present

## 2017-12-11 DIAGNOSIS — N186 End stage renal disease: Secondary | ICD-10-CM | POA: Diagnosis not present

## 2017-12-11 DIAGNOSIS — D509 Iron deficiency anemia, unspecified: Secondary | ICD-10-CM | POA: Diagnosis not present

## 2017-12-11 DIAGNOSIS — N2581 Secondary hyperparathyroidism of renal origin: Secondary | ICD-10-CM | POA: Diagnosis not present

## 2017-12-11 DIAGNOSIS — E1129 Type 2 diabetes mellitus with other diabetic kidney complication: Secondary | ICD-10-CM | POA: Diagnosis not present

## 2017-12-13 DIAGNOSIS — E1129 Type 2 diabetes mellitus with other diabetic kidney complication: Secondary | ICD-10-CM | POA: Diagnosis not present

## 2017-12-13 DIAGNOSIS — D509 Iron deficiency anemia, unspecified: Secondary | ICD-10-CM | POA: Diagnosis not present

## 2017-12-13 DIAGNOSIS — D631 Anemia in chronic kidney disease: Secondary | ICD-10-CM | POA: Diagnosis not present

## 2017-12-13 DIAGNOSIS — N186 End stage renal disease: Secondary | ICD-10-CM | POA: Diagnosis not present

## 2017-12-13 DIAGNOSIS — N2581 Secondary hyperparathyroidism of renal origin: Secondary | ICD-10-CM | POA: Diagnosis not present

## 2017-12-15 ENCOUNTER — Encounter (HOSPITAL_COMMUNITY): Payer: Self-pay | Admitting: Emergency Medicine

## 2017-12-15 ENCOUNTER — Ambulatory Visit (HOSPITAL_COMMUNITY)
Admission: EM | Admit: 2017-12-15 | Discharge: 2017-12-15 | Disposition: A | Discharge status: Home / Self Care | Payer: Medicare Other | Attending: Internal Medicine | Admitting: Internal Medicine

## 2017-12-15 DIAGNOSIS — S91101A Unspecified open wound of right great toe without damage to nail, initial encounter: Secondary | ICD-10-CM | POA: Diagnosis not present

## 2017-12-15 DIAGNOSIS — E119 Type 2 diabetes mellitus without complications: Secondary | ICD-10-CM | POA: Diagnosis not present

## 2017-12-15 DIAGNOSIS — Z992 Dependence on renal dialysis: Secondary | ICD-10-CM

## 2017-12-15 DIAGNOSIS — W268XXA Contact with other sharp object(s), not elsewhere classified, initial encounter: Secondary | ICD-10-CM

## 2017-12-15 DIAGNOSIS — S91109A Unspecified open wound of unspecified toe(s) without damage to nail, initial encounter: Secondary | ICD-10-CM

## 2017-12-15 MED ORDER — CEPHALEXIN 500 MG PO CAPS: 500.0000 mg | ORAL_CAPSULE | Freq: Every day | ORAL | 0 refills | Status: AC

## 2017-12-15 NOTE — ED Triage Notes (Signed)
Pt here with laceration to right foot today; pt dialysis pt on heparin

## 2017-12-15 NOTE — Discharge Instructions (Signed)
Keep pressure dressing in place for the next 24 hours. Starting tomorrow night wash with soap and water daily, reapply dressing. Complete course of antibiotics to prevent infection. If develop increased redness, drainage, odor or otherwise signs of infections please return or go to your primary care provider.

## 2017-12-15 NOTE — ED Provider Notes (Signed)
[address]    CSN: [id number] Arrival date & time: 12/15/17  1807     History   Chief Complaint Chief Complaint  Patient presents with  . Laceration    HPI [name] [name] is a 62 y.o. male.   [name] [name] presents with complaints of cut to his right great toe. He states he kicked something in his bedroom, he thinks it was a box spring. Occurred at approximately 1600 today. Has been bleeding. He did try to soak it which has not helped with bleeding. Diabetic. Dialysis, last was Friday. Minimal pain. tdap <5 years ago. History reviewed.    ROS per HPI.       Past Medical History:  Diagnosis Date  . Anemia   . Anginal pain ([place])   . CAD (coronary artery disease)    a. per [facility] s/p 3.9mm x 57mm Vision BMS to mid LAD 12/2009 and Xience DES to mid LAD 10/2010.  [name] Chronic diastolic CHF (congestive heart failure) ([place])   . Colon polyps   . Daily headache   . ESRD on dialysis [facility]) since ~ 2008   "MWF; [name] [name]" (03/04/2017)  . GERD (gastroesophageal reflux disease)   . Heart murmur   . Hematochezia    a. 2014: colonscopy, which showed moderately-sized internal hemorrhoids, two 49mm polyps in transverse colon and ascending colon that were resected, five 2-54mm polyps in sigmoid colon, descending colon, transverse colon, and ascending colon that were resected. An upper endoscopy was performed and showed normal esophagus, stomach, and duodenum.  . Hematuria    a. H/o hematuria 2014 with cystoscopy that was unrevealing for his source of hematuria. He underwent a kidney ultrasound on 10/14 that showed mildly echogenic and scarred kidneys compatible with medical renal disease, without hydronephrosis or renal calculi.  [name] History of blood transfusion    "had colonoscopy done; they had to give me some blood"  . Hyperlipidemia   . Hypertension   . On home oxygen therapy    "2L prn" (07/21/2015); "been off it for awhile" (03/04/2017)  . Renal insufficiency   .  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 Type II diabetes mellitus ([place])   . Wears dentures     Patient Active Problem List   Diagnosis Date Noted  . Tobacco use 10/29/2017  . Mild cognitive impairment 06/13/2017  . Chest pain 06/07/2017  . Skin ulcer of toe of right foot, limited to breakdown of skin ([name] [name])   . Callus of foot 03/07/2017  . Nausea and vomiting 04/02/2016  . ESRD (end stage renal disease) ([place]) 03/18/2016  . Mitral regurgitation   . Diabetic neuropathy ([place]) 07/29/2015  . Depression 07/22/2015  . GERD (gastroesophageal reflux disease) 06/04/2015  . Malnutrition of moderate degree ([place]) 05/17/2015  . Thrombocytopenia ([place]) 05/16/2015  . Weight loss 05/16/2015  . H/O TIA (transient ischemic attack) 04/01/2015  . Diastolic dysfunction-grade 2 12/13/2014  . Hypertension 04/27/2014  . CAD -S/P LAD BMS 2011, LAD DES 2012- patent cors Feb 2016 04/27/2014  . Diabetes mellitus type 2, diet-controlled ([place]) 04/27/2014    Past Surgical History:  Procedure Laterality Date  . AV FISTULA PLACEMENT Left ~ 2007   "upper arm"  . CARDIAC CATHETERIZATION  "several"  . CORONARY ANGIOPLASTY WITH STENT PLACEMENT  "several"  . CYSTOSCOPY W/ STONE MANIPULATION  X2?  . EYE SURGERY Bilateral    "laser OR for hemorrhage"  . LEFT HEART CATHETERIZATION WITH CORONARY ANGIOGRAM N/A  11/23/2014   Procedure: LEFT HEART CATHETERIZATION WITH CORONARY ANGIOGRAM;  Surgeon: [name] Sine, MD;  Location: [facility] CATH LAB;  Service: Cardiovascular;  Laterality: N/A;  . LITHOTRIPSY  X1  . REVISON OF ARTERIOVENOUS FISTULA Left 24/40/1027   Procedure: PLICATION OF LEFT ARM ARTERIOVENOUS FISTULA;  Surgeon: [name] Mould, MD;  Location: [facility];  Service: Vascular;  Laterality: Left;       Home Medications    Prior to Admission medications   Medication Sig Start Date End Date Taking? Authorizing Provider  albuterol (PROVENTIL HFA;VENTOLIN HFA) 108 (90 Base) MCG/ACT  inhaler Inhale 1-2 puffs into the lungs every 6 (six) hours as needed for wheezing or shortness of breath. Patient not taking: Reported on 12/01/2017 01/19/16   [name] Grana, PA-C  aspirin 81 MG chewable tablet Chew 1 tablet (81 mg total) by mouth daily. Patient taking differently: Chew 81 mg by mouth at bedtime.  07/22/15   [name] Oven, MD  atorvastatin (LIPITOR) 20 MG tablet Take 20 mg by mouth at bedtime. Reported on 03/14/2016    [provider]  carvedilol (COREG) 25 MG tablet Take 1 tablet (25 mg total) by mouth See admin instructions. Take 1 tablet (25 mg) by mouth daily at bedtime, take 1 tablet (25 mg) on Sunday, Tuesday, Thursday, Saturday mornings 10/09/17   Molt, Bethany, DO  cephALEXin (KEFLEX) 500 MG capsule Take 1 capsule (500 mg total) by mouth daily for 7 days. 12/15/17 12/22/17  [name] Gottron, NP  cinacalcet (SENSIPAR) 90 MG tablet Take 180 mg by mouth every Monday, Wednesday, and Friday with hemodialysis.     [provider]  isosorbide mononitrate (IMDUR) 30 MG 24 hr tablet Take 1 tablet (30 mg total) by mouth daily. 10/29/17 10/29/18  Alphonzo Grieve, MD  metoCLOPramide (REGLAN) 5 MG tablet Take 1 tablet (5 mg total) by mouth 3 (three) times daily as needed for nausea. 10/29/17 10/29/18  Alphonzo Grieve, MD  multivitamin (RENA-VIT) TABS tablet Take 1 tablet by mouth at bedtime. 12/15/14   Geradine Girt, DO  nitroGLYCERIN (NITROSTAT) 0.4 MG SL tablet Place 0.4 mg under the tongue every 5 (five) minutes as needed for chest pain.    [provider]  sevelamer carbonate (RENVELA) 800 MG tablet Take 4 tablets (3,200 mg total) by mouth 3 (three) times daily with meals. Patient taking differently: Take 2,400-3,200 mg by mouth See admin instructions. Take 4 tablets (3200 mg) by mouth three times daily with meals and 3 tablets (2400 mg) with snacks 03/12/16   Geradine Girt, DO    Family History Family History  Problem Relation Age of Onset  . Bone cancer Mother    . Anuerysm Father   . Hypertension Unknown   . Diabetes type II Daughter     Social History Social History   Tobacco Use  . Smoking status: Current Some Day Smoker    Packs/day: 0.50    Years: 8.00    Pack years: 4.00    Types: Cigarettes  . Smokeless tobacco: Never Used  . Tobacco comment: 3-4 per day.  wants patch  Substance Use Topics  . Alcohol use: No    Alcohol/week: 0.0 oz  . Drug use: No     Allergies   Enalapril; Latex; and Tape   Review of Systems Review of Systems   Physical Exam Triage Vital Signs ED Triage Vitals [12/15/17 1934]  Enc Vitals Group     BP (!) 166/74     Pulse Rate 80  Resp 18     Temp 98.4 F (36.9 C)     Temp Source Oral     SpO2 97 %     Weight      Height      Head Circumference      Peak Flow      Pain Score      Pain Loc      Pain Edu?      Excl. in [place]?    No data found.  Updated Vital Signs BP (!) 166/74 (BP Location: Right Arm)   Pulse 80   Temp 98.4 F (36.9 C) (Oral)   Resp 18   SpO2 97%   Visual Acuity Right Eye Distance:   Left Eye Distance:   Bilateral Distance:    Right Eye Near:   Left Eye Near:    Bilateral Near:     Physical Exam  Musculoskeletal:       Right foot: There is laceration. There is no tenderness, no bony tenderness and no swelling.       Feet:  Approximately 1cmx 1cm square avulsion to toe, medial region with oozing of blood noted; minimal tenderness to region, full sensation to toe and full ROm without tenderness or bruising; shallow, only approximately 1-67mm deep in deepest region  Skin: Skin is warm and dry.     UC Treatments / Results  Labs (all labs ordered are listed, but only abnormal results are displayed) Labs Reviewed - No data to display  EKG  EKG Interpretation None       Radiology No results found.  Procedures Wound Care Date/Time: 12/15/2017 8:31 PM Performed by: [name] Gottron, NP Authorized by: Wynona Luna, MD   Consent:     Consent obtained:  Verbal   Consent given by:  Patient   Risks discussed:  Bleeding, infection, pain and poor cosmetic result   Alternatives discussed:  Alternative treatment and no treatment Anesthesia (see MAR for exact dosages):    Anesthesia method:  None Procedure details:    Indications: open wounds     Wound exploration location: extremity     Wound age (days):  <1   Debridement level: subcutaneous tissue     Debridement level comment:  Irrigated with wound cleaning spray profusely    Wound surface area (sq cm):  1 Skin layer closed with:    Wound care performed: silver nitrate applied to base of wound, still with mild oozing of blood  Dressing:    Dressing applied:  4x4   Wrapped with:  Bulky dressing Post-procedure details:    Patient tolerance of procedure:  Tolerated well, no immediate complications   (including critical care time)  Medications Ordered in UC Medications - No data to display   Initial Impression / Assessment and Plan / UC Course  I have reviewed the triage vital signs and the nursing notes.  Pertinent labs & imaging results that were available during my care of the patient were reviewed by me and considered in my medical decision making (see chart for details).     Silver nitrate applied at base of wound for persistent oozing following irrigation of wound, bulky pressure dressing placed. Bleeding controlled at this time. Recommended keep dressing in place for the next 24 hours. Wound care discussed. Prophylactic antibiotics due to DM and dialysis patient. Signs of infection discussed. Toe appears well perfused, sensation intact and cap refill< 2 seconds. Return precautions provided. Patient verbalized understanding and agreeable to plan. Ambulatory out  of clinic without difficulty.    Final Clinical Impressions(s) / UC Diagnoses   Final diagnoses:  Avulsion of toe, initial encounter    ED Discharge Orders        Ordered    cephALEXin (KEFLEX) 500  MG capsule  Daily     12/15/17 2019       Controlled Substance Prescriptions [place] Controlled Substance Registry consulted? Not Applicable   [name] Gottron, NP 12/15/17 2035

## 2017-12-16 ENCOUNTER — Telehealth: Payer: Self-pay | Admitting: Internal Medicine

## 2017-12-16 DIAGNOSIS — N2581 Secondary hyperparathyroidism of renal origin: Secondary | ICD-10-CM | POA: Diagnosis not present

## 2017-12-16 DIAGNOSIS — E1129 Type 2 diabetes mellitus with other diabetic kidney complication: Secondary | ICD-10-CM | POA: Diagnosis not present

## 2017-12-16 DIAGNOSIS — D631 Anemia in chronic kidney disease: Secondary | ICD-10-CM | POA: Diagnosis not present

## 2017-12-16 DIAGNOSIS — N186 End stage renal disease: Secondary | ICD-10-CM | POA: Diagnosis not present

## 2017-12-16 DIAGNOSIS — D509 Iron deficiency anemia, unspecified: Secondary | ICD-10-CM | POA: Diagnosis not present

## 2017-12-16 NOTE — Telephone Encounter (Signed)
Patient having heart surgery, he is wanting the dr to prescription him something from anxiety. Pls give patient a called

## 2017-12-18 DIAGNOSIS — E1129 Type 2 diabetes mellitus with other diabetic kidney complication: Secondary | ICD-10-CM | POA: Diagnosis not present

## 2017-12-18 DIAGNOSIS — N186 End stage renal disease: Secondary | ICD-10-CM | POA: Diagnosis not present

## 2017-12-18 DIAGNOSIS — D631 Anemia in chronic kidney disease: Secondary | ICD-10-CM | POA: Diagnosis not present

## 2017-12-18 DIAGNOSIS — N2581 Secondary hyperparathyroidism of renal origin: Secondary | ICD-10-CM | POA: Diagnosis not present

## 2017-12-18 DIAGNOSIS — D509 Iron deficiency anemia, unspecified: Secondary | ICD-10-CM | POA: Diagnosis not present

## 2017-12-20 DIAGNOSIS — E1129 Type 2 diabetes mellitus with other diabetic kidney complication: Secondary | ICD-10-CM | POA: Diagnosis not present

## 2017-12-20 DIAGNOSIS — D631 Anemia in chronic kidney disease: Secondary | ICD-10-CM | POA: Diagnosis not present

## 2017-12-20 DIAGNOSIS — N2581 Secondary hyperparathyroidism of renal origin: Secondary | ICD-10-CM | POA: Diagnosis not present

## 2017-12-20 DIAGNOSIS — D509 Iron deficiency anemia, unspecified: Secondary | ICD-10-CM | POA: Diagnosis not present

## 2017-12-20 DIAGNOSIS — N186 End stage renal disease: Secondary | ICD-10-CM | POA: Diagnosis not present

## 2017-12-23 DIAGNOSIS — D509 Iron deficiency anemia, unspecified: Secondary | ICD-10-CM | POA: Diagnosis not present

## 2017-12-23 DIAGNOSIS — N186 End stage renal disease: Secondary | ICD-10-CM | POA: Diagnosis not present

## 2017-12-23 DIAGNOSIS — N2581 Secondary hyperparathyroidism of renal origin: Secondary | ICD-10-CM | POA: Diagnosis not present

## 2017-12-23 DIAGNOSIS — D631 Anemia in chronic kidney disease: Secondary | ICD-10-CM | POA: Diagnosis not present

## 2017-12-23 DIAGNOSIS — E1129 Type 2 diabetes mellitus with other diabetic kidney complication: Secondary | ICD-10-CM | POA: Diagnosis not present

## 2017-12-25 DIAGNOSIS — E1129 Type 2 diabetes mellitus with other diabetic kidney complication: Secondary | ICD-10-CM | POA: Diagnosis not present

## 2017-12-25 DIAGNOSIS — N2581 Secondary hyperparathyroidism of renal origin: Secondary | ICD-10-CM | POA: Diagnosis not present

## 2017-12-25 DIAGNOSIS — D631 Anemia in chronic kidney disease: Secondary | ICD-10-CM | POA: Diagnosis not present

## 2017-12-25 DIAGNOSIS — N186 End stage renal disease: Secondary | ICD-10-CM | POA: Diagnosis not present

## 2017-12-25 DIAGNOSIS — D509 Iron deficiency anemia, unspecified: Secondary | ICD-10-CM | POA: Diagnosis not present

## 2017-12-26 DIAGNOSIS — Z951 Presence of aortocoronary bypass graft: Secondary | ICD-10-CM | POA: Diagnosis not present

## 2017-12-26 DIAGNOSIS — R570 Cardiogenic shock: Secondary | ICD-10-CM | POA: Diagnosis not present

## 2017-12-26 DIAGNOSIS — I132 Hypertensive heart and chronic kidney disease with heart failure and with stage 5 chronic kidney disease, or end stage renal disease: Secondary | ICD-10-CM | POA: Diagnosis present

## 2017-12-26 DIAGNOSIS — J9811 Atelectasis: Secondary | ICD-10-CM | POA: Diagnosis not present

## 2017-12-26 DIAGNOSIS — Z952 Presence of prosthetic heart valve: Secondary | ICD-10-CM | POA: Diagnosis not present

## 2017-12-26 DIAGNOSIS — Z87891 Personal history of nicotine dependence: Secondary | ICD-10-CM | POA: Diagnosis not present

## 2017-12-26 DIAGNOSIS — D72829 Elevated white blood cell count, unspecified: Secondary | ICD-10-CM | POA: Diagnosis not present

## 2017-12-26 DIAGNOSIS — Z9889 Other specified postprocedural states: Secondary | ICD-10-CM | POA: Diagnosis not present

## 2017-12-26 DIAGNOSIS — J984 Other disorders of lung: Secondary | ICD-10-CM | POA: Diagnosis not present

## 2017-12-26 DIAGNOSIS — Z4682 Encounter for fitting and adjustment of non-vascular catheter: Secondary | ICD-10-CM | POA: Diagnosis not present

## 2017-12-26 DIAGNOSIS — T8111XA Postprocedural  cardiogenic shock, initial encounter: Secondary | ICD-10-CM | POA: Diagnosis not present

## 2017-12-26 DIAGNOSIS — I25110 Atherosclerotic heart disease of native coronary artery with unstable angina pectoris: Secondary | ICD-10-CM | POA: Diagnosis not present

## 2017-12-26 DIAGNOSIS — E1122 Type 2 diabetes mellitus with diabetic chronic kidney disease: Secondary | ICD-10-CM | POA: Diagnosis present

## 2017-12-26 DIAGNOSIS — J90 Pleural effusion, not elsewhere classified: Secondary | ICD-10-CM | POA: Diagnosis not present

## 2017-12-26 DIAGNOSIS — D689 Coagulation defect, unspecified: Secondary | ICD-10-CM | POA: Diagnosis not present

## 2017-12-26 DIAGNOSIS — B189 Chronic viral hepatitis, unspecified: Secondary | ICD-10-CM | POA: Diagnosis present

## 2017-12-26 DIAGNOSIS — R0989 Other specified symptoms and signs involving the circulatory and respiratory systems: Secondary | ICD-10-CM | POA: Diagnosis not present

## 2017-12-26 DIAGNOSIS — N186 End stage renal disease: Secondary | ICD-10-CM | POA: Diagnosis present

## 2017-12-26 DIAGNOSIS — R0689 Other abnormalities of breathing: Secondary | ICD-10-CM | POA: Diagnosis not present

## 2017-12-26 DIAGNOSIS — R918 Other nonspecific abnormal finding of lung field: Secondary | ICD-10-CM | POA: Diagnosis not present

## 2017-12-26 DIAGNOSIS — I5040 Unspecified combined systolic (congestive) and diastolic (congestive) heart failure: Secondary | ICD-10-CM | POA: Diagnosis present

## 2017-12-26 DIAGNOSIS — J811 Chronic pulmonary edema: Secondary | ICD-10-CM | POA: Diagnosis not present

## 2017-12-26 DIAGNOSIS — E872 Acidosis: Secondary | ICD-10-CM | POA: Diagnosis not present

## 2017-12-26 DIAGNOSIS — D62 Acute posthemorrhagic anemia: Secondary | ICD-10-CM | POA: Diagnosis not present

## 2017-12-26 DIAGNOSIS — I313 Pericardial effusion (noninflammatory): Secondary | ICD-10-CM | POA: Diagnosis not present

## 2017-12-26 DIAGNOSIS — Z8669 Personal history of other diseases of the nervous system and sense organs: Secondary | ICD-10-CM | POA: Diagnosis not present

## 2017-12-26 DIAGNOSIS — I700 Atherosclerosis of aorta: Secondary | ICD-10-CM | POA: Diagnosis present

## 2017-12-26 DIAGNOSIS — I2510 Atherosclerotic heart disease of native coronary artery without angina pectoris: Secondary | ICD-10-CM | POA: Diagnosis present

## 2017-12-26 DIAGNOSIS — Z7982 Long term (current) use of aspirin: Secondary | ICD-10-CM | POA: Diagnosis not present

## 2017-12-26 DIAGNOSIS — E875 Hyperkalemia: Secondary | ICD-10-CM | POA: Diagnosis present

## 2017-12-26 DIAGNOSIS — E785 Hyperlipidemia, unspecified: Secondary | ICD-10-CM | POA: Diagnosis present

## 2017-12-26 DIAGNOSIS — Z9911 Dependence on respirator [ventilator] status: Secondary | ICD-10-CM | POA: Diagnosis not present

## 2017-12-26 DIAGNOSIS — K219 Gastro-esophageal reflux disease without esophagitis: Secondary | ICD-10-CM | POA: Diagnosis present

## 2017-12-26 DIAGNOSIS — Z992 Dependence on renal dialysis: Secondary | ICD-10-CM | POA: Diagnosis not present

## 2017-12-26 DIAGNOSIS — J951 Acute pulmonary insufficiency following thoracic surgery: Secondary | ICD-10-CM | POA: Diagnosis not present

## 2017-12-26 DIAGNOSIS — I959 Hypotension, unspecified: Secondary | ICD-10-CM | POA: Diagnosis not present

## 2017-12-26 DIAGNOSIS — I517 Cardiomegaly: Secondary | ICD-10-CM | POA: Diagnosis not present

## 2017-12-26 DIAGNOSIS — I480 Paroxysmal atrial fibrillation: Secondary | ICD-10-CM | POA: Diagnosis not present

## 2017-12-26 DIAGNOSIS — I340 Nonrheumatic mitral (valve) insufficiency: Secondary | ICD-10-CM | POA: Diagnosis present

## 2017-12-26 DIAGNOSIS — F05 Delirium due to known physiological condition: Secondary | ICD-10-CM | POA: Diagnosis not present

## 2017-12-26 DIAGNOSIS — Z452 Encounter for adjustment and management of vascular access device: Secondary | ICD-10-CM | POA: Diagnosis not present

## 2017-12-26 NOTE — Telephone Encounter (Signed)
Spoke w/ dr Trilby Drummer, pt was 1 day out from surg when he rec'd message, he felt that surg/ cardiology would address

## 2018-01-06 DIAGNOSIS — E1122 Type 2 diabetes mellitus with diabetic chronic kidney disease: Secondary | ICD-10-CM | POA: Diagnosis not present

## 2018-01-06 DIAGNOSIS — Z992 Dependence on renal dialysis: Secondary | ICD-10-CM | POA: Diagnosis not present

## 2018-01-06 DIAGNOSIS — N186 End stage renal disease: Secondary | ICD-10-CM | POA: Diagnosis not present

## 2018-01-08 DIAGNOSIS — N2581 Secondary hyperparathyroidism of renal origin: Secondary | ICD-10-CM | POA: Diagnosis not present

## 2018-01-08 DIAGNOSIS — N186 End stage renal disease: Secondary | ICD-10-CM | POA: Diagnosis not present

## 2018-01-08 DIAGNOSIS — E1129 Type 2 diabetes mellitus with other diabetic kidney complication: Secondary | ICD-10-CM | POA: Diagnosis not present

## 2018-01-08 DIAGNOSIS — D509 Iron deficiency anemia, unspecified: Secondary | ICD-10-CM | POA: Diagnosis not present

## 2018-01-08 DIAGNOSIS — D631 Anemia in chronic kidney disease: Secondary | ICD-10-CM | POA: Diagnosis not present

## 2018-01-09 DIAGNOSIS — Z7982 Long term (current) use of aspirin: Secondary | ICD-10-CM | POA: Diagnosis not present

## 2018-01-09 DIAGNOSIS — Z951 Presence of aortocoronary bypass graft: Secondary | ICD-10-CM | POA: Diagnosis not present

## 2018-01-09 DIAGNOSIS — I130 Hypertensive heart and chronic kidney disease with heart failure and stage 1 through stage 4 chronic kidney disease, or unspecified chronic kidney disease: Secondary | ICD-10-CM | POA: Diagnosis not present

## 2018-01-09 DIAGNOSIS — E1142 Type 2 diabetes mellitus with diabetic polyneuropathy: Secondary | ICD-10-CM | POA: Diagnosis not present

## 2018-01-09 DIAGNOSIS — H2510 Age-related nuclear cataract, unspecified eye: Secondary | ICD-10-CM | POA: Diagnosis not present

## 2018-01-09 DIAGNOSIS — Z952 Presence of prosthetic heart valve: Secondary | ICD-10-CM | POA: Diagnosis not present

## 2018-01-09 DIAGNOSIS — E11319 Type 2 diabetes mellitus with unspecified diabetic retinopathy without macular edema: Secondary | ICD-10-CM | POA: Diagnosis not present

## 2018-01-09 DIAGNOSIS — I5040 Unspecified combined systolic (congestive) and diastolic (congestive) heart failure: Secondary | ICD-10-CM | POA: Diagnosis not present

## 2018-01-09 DIAGNOSIS — N186 End stage renal disease: Secondary | ICD-10-CM | POA: Diagnosis not present

## 2018-01-09 DIAGNOSIS — I272 Pulmonary hypertension, unspecified: Secondary | ICD-10-CM | POA: Diagnosis not present

## 2018-01-09 DIAGNOSIS — Z992 Dependence on renal dialysis: Secondary | ICD-10-CM | POA: Diagnosis not present

## 2018-01-09 DIAGNOSIS — E7800 Pure hypercholesterolemia, unspecified: Secondary | ICD-10-CM | POA: Diagnosis not present

## 2018-01-09 DIAGNOSIS — I2510 Atherosclerotic heart disease of native coronary artery without angina pectoris: Secondary | ICD-10-CM | POA: Diagnosis not present

## 2018-01-09 DIAGNOSIS — K219 Gastro-esophageal reflux disease without esophagitis: Secondary | ICD-10-CM | POA: Diagnosis not present

## 2018-01-09 DIAGNOSIS — M1990 Unspecified osteoarthritis, unspecified site: Secondary | ICD-10-CM | POA: Diagnosis not present

## 2018-01-09 DIAGNOSIS — E1122 Type 2 diabetes mellitus with diabetic chronic kidney disease: Secondary | ICD-10-CM | POA: Diagnosis not present

## 2018-01-09 DIAGNOSIS — Z48812 Encounter for surgical aftercare following surgery on the circulatory system: Secondary | ICD-10-CM | POA: Diagnosis not present

## 2018-01-12 DIAGNOSIS — I130 Hypertensive heart and chronic kidney disease with heart failure and stage 1 through stage 4 chronic kidney disease, or unspecified chronic kidney disease: Secondary | ICD-10-CM | POA: Diagnosis not present

## 2018-01-12 DIAGNOSIS — E1122 Type 2 diabetes mellitus with diabetic chronic kidney disease: Secondary | ICD-10-CM | POA: Diagnosis not present

## 2018-01-12 DIAGNOSIS — N186 End stage renal disease: Secondary | ICD-10-CM | POA: Diagnosis not present

## 2018-01-12 DIAGNOSIS — I2510 Atherosclerotic heart disease of native coronary artery without angina pectoris: Secondary | ICD-10-CM | POA: Diagnosis not present

## 2018-01-12 DIAGNOSIS — Z48812 Encounter for surgical aftercare following surgery on the circulatory system: Secondary | ICD-10-CM | POA: Diagnosis not present

## 2018-01-12 DIAGNOSIS — I5040 Unspecified combined systolic (congestive) and diastolic (congestive) heart failure: Secondary | ICD-10-CM | POA: Diagnosis not present

## 2018-01-13 ENCOUNTER — Other Ambulatory Visit: Payer: Self-pay

## 2018-01-13 ENCOUNTER — Emergency Department (HOSPITAL_COMMUNITY)
Admission: EM | Admit: 2018-01-13 | Discharge: 2018-01-13 | Discharge status: Absent without leave | Payer: Medicare Other

## 2018-01-13 DIAGNOSIS — R0789 Other chest pain: Secondary | ICD-10-CM | POA: Diagnosis not present

## 2018-01-13 DIAGNOSIS — R072 Precordial pain: Secondary | ICD-10-CM | POA: Diagnosis not present

## 2018-01-13 DIAGNOSIS — Z79899 Other long term (current) drug therapy: Secondary | ICD-10-CM | POA: Diagnosis not present

## 2018-01-13 DIAGNOSIS — I509 Heart failure, unspecified: Secondary | ICD-10-CM | POA: Diagnosis not present

## 2018-01-13 DIAGNOSIS — Z992 Dependence on renal dialysis: Secondary | ICD-10-CM | POA: Diagnosis not present

## 2018-01-13 DIAGNOSIS — R079 Chest pain, unspecified: Secondary | ICD-10-CM | POA: Diagnosis not present

## 2018-01-13 DIAGNOSIS — R5383 Other fatigue: Secondary | ICD-10-CM | POA: Diagnosis not present

## 2018-01-13 DIAGNOSIS — E1129 Type 2 diabetes mellitus with other diabetic kidney complication: Secondary | ICD-10-CM | POA: Diagnosis not present

## 2018-01-13 DIAGNOSIS — D509 Iron deficiency anemia, unspecified: Secondary | ICD-10-CM | POA: Diagnosis not present

## 2018-01-13 DIAGNOSIS — R918 Other nonspecific abnormal finding of lung field: Secondary | ICD-10-CM | POA: Diagnosis not present

## 2018-01-13 DIAGNOSIS — D631 Anemia in chronic kidney disease: Secondary | ICD-10-CM | POA: Diagnosis not present

## 2018-01-13 DIAGNOSIS — N186 End stage renal disease: Secondary | ICD-10-CM | POA: Diagnosis not present

## 2018-01-13 DIAGNOSIS — N2581 Secondary hyperparathyroidism of renal origin: Secondary | ICD-10-CM | POA: Diagnosis not present

## 2018-01-13 DIAGNOSIS — J9811 Atelectasis: Secondary | ICD-10-CM | POA: Diagnosis not present

## 2018-01-13 DIAGNOSIS — I132 Hypertensive heart and chronic kidney disease with heart failure and with stage 5 chronic kidney disease, or end stage renal disease: Secondary | ICD-10-CM | POA: Diagnosis not present

## 2018-01-13 DIAGNOSIS — E1122 Type 2 diabetes mellitus with diabetic chronic kidney disease: Secondary | ICD-10-CM | POA: Diagnosis not present

## 2018-01-13 DIAGNOSIS — Z888 Allergy status to other drugs, medicaments and biological substances status: Secondary | ICD-10-CM | POA: Diagnosis not present

## 2018-01-13 DIAGNOSIS — E042 Nontoxic multinodular goiter: Secondary | ICD-10-CM | POA: Diagnosis not present

## 2018-01-13 NOTE — ED Notes (Signed)
Called 3x no reply. Did not see pt in lobby.

## 2018-01-14 ENCOUNTER — Other Ambulatory Visit: Payer: Self-pay

## 2018-01-14 ENCOUNTER — Ambulatory Visit (INDEPENDENT_AMBULATORY_CARE_PROVIDER_SITE_OTHER): Payer: Medicare Other | Admitting: Internal Medicine

## 2018-01-14 DIAGNOSIS — Z951 Presence of aortocoronary bypass graft: Secondary | ICD-10-CM | POA: Diagnosis not present

## 2018-01-14 DIAGNOSIS — N189 Chronic kidney disease, unspecified: Secondary | ICD-10-CM | POA: Diagnosis not present

## 2018-01-14 DIAGNOSIS — G4700 Insomnia, unspecified: Secondary | ICD-10-CM

## 2018-01-14 DIAGNOSIS — Z5321 Procedure and treatment not carried out due to patient leaving prior to being seen by health care provider: Secondary | ICD-10-CM | POA: Diagnosis not present

## 2018-01-14 DIAGNOSIS — Z87891 Personal history of nicotine dependence: Secondary | ICD-10-CM | POA: Diagnosis not present

## 2018-01-14 DIAGNOSIS — R079 Chest pain, unspecified: Secondary | ICD-10-CM | POA: Diagnosis not present

## 2018-01-14 DIAGNOSIS — Z992 Dependence on renal dialysis: Secondary | ICD-10-CM

## 2018-01-14 DIAGNOSIS — D649 Anemia, unspecified: Secondary | ICD-10-CM | POA: Insufficient documentation

## 2018-01-14 DIAGNOSIS — N186 End stage renal disease: Secondary | ICD-10-CM

## 2018-01-14 DIAGNOSIS — Z7982 Long term (current) use of aspirin: Secondary | ICD-10-CM

## 2018-01-14 DIAGNOSIS — D631 Anemia in chronic kidney disease: Secondary | ICD-10-CM

## 2018-01-14 DIAGNOSIS — G2581 Restless legs syndrome: Secondary | ICD-10-CM | POA: Diagnosis not present

## 2018-01-14 MED ORDER — ROPINIROLE HCL 0.25 MG PO TABS: 0.2500 mg | ORAL_TABLET | Freq: Every day | ORAL | 1 refills | Status: DC

## 2018-01-14 NOTE — Patient Instructions (Addendum)
FOLLOW-UP INSTRUCTIONS When: Next available PCP  For: Follow up What to bring: Medications   [name],  It was a pleasure to see you. I have ordered labs to work up your symptoms. I am worried your iron and blood counts are low and may be contributing. I have also started you on a medicine to help with your night time symptoms. Please take this 1-3 hours prior to bed. Please take one tablet 0.25 mg for the first 3 days, then you may increase to 2 tablets 0.5 mg if you need additional relief. Please schedule an appointment with your primary care doctor to follow up. If you have any questions or concerns, call our clinic at [phone] or after hours call [phone] and ask for the internal medicine resident on call. Thank you!  - Dr.Ceonna Frazzini   Restless Legs Syndrome Restless legs syndrome is a condition that causes uncomfortable feelings or sensations in the legs, especially while sitting or lying down. The sensations usually cause an overwhelming urge to move the legs. The arms can also sometimes be affected. The condition can range from mild to severe. The symptoms often interfere with a person's ability to sleep. What are the causes? The cause of this condition is not known. What increases the risk? This condition is more likely to develop in:  People who are older than age 40.  Pregnant women. In general, restless legs syndrome is more common in women than in men.  People who have a family history of the condition.  People who have certain medical conditions, such as iron deficiency, kidney disease, Parkinson disease, or nerve damage.  People who take certain medicines, such as medicines for high blood pressure, nausea, colds, allergies, depression, and some heart conditions.  What are the signs or symptoms? The main symptom of this condition is uncomfortable sensations in the legs. These sensations may be:  Described as pulling, tingling, prickling, throbbing, crawling, or  burning.  Worse while you are sitting or lying down.  Worse during periods of rest or inactivity.  Worse at night, often interfering with your sleep.  Accompanied by a very strong urge to move your legs.  Temporarily relieved by movement of your legs.  The sensations usually affect both sides of the body. The arms can also be affected, but this is rare. People who have this condition often have tiredness during the day because of their lack of sleep at night. How is this diagnosed? This condition may be diagnosed based on your description of the symptoms. You may also have tests, including blood tests, to check for other conditions that may lead to your symptoms. In some cases, you may be asked to spend some time in a sleep lab so your sleeping can be monitored. How is this treated? Treatment for this condition is focused on managing the symptoms. Treatment may include:  Self-help and lifestyle changes.  Medicines.  Follow these instructions at home:  Take medicines only as directed by your health care provider.  Try these methods to get temporary relief from the uncomfortable sensations: ? Massage your legs. ? Walk or stretch. ? Take a cold or hot bath.  Practice good sleep habits. For example, go to bed and get up at the same time every day.  Exercise regularly.  Practice ways of relaxing, such as yoga or meditation.  Avoid caffeine and alcohol.  Do not use any tobacco products, including cigarettes, chewing tobacco, or electronic cigarettes. If you need help quitting, ask your health  care provider.  Keep all follow-up visits as directed by your health care provider. This is important. Contact a health care provider if: Your symptoms do not improve with treatment, or they get worse. This information is not intended to replace advice given to you by your health care provider. Make sure you discuss any questions you have with your health care provider. Document Released:  09/14/2002 Document Revised: 03/01/2016 Document Reviewed: 09/20/2014 Elsevier Interactive Patient Education  [employer].

## 2018-01-14 NOTE — Progress Notes (Signed)
   CC: Restlessness at night  HPI:  [name] [name] is a 62 y.o. male with past medical history outlined below here with complaint of restlessness at night. For the details of today's visit, please refer to the assessment and plan.  Past Medical History:  Diagnosis Date  . Anemia   . Anginal pain ([place])   . CAD (coronary artery disease)    a. per [facility] s/p 3.54mm x 10mm Vision BMS to mid LAD 12/2009 and Xience DES to mid LAD 10/2010.  [name] Chronic diastolic CHF (congestive heart failure) ([place])   . Colon polyps   . Daily headache   . ESRD on dialysis [facility]) since ~ 2008   "MWF; Jeneen Rinks" (03/04/2017)  . GERD (gastroesophageal reflux disease)   . Heart murmur   . Hematochezia    a. 2014: colonscopy, which showed moderately-sized internal hemorrhoids, two 51mm polyps in transverse colon and ascending colon that were resected, five 2-28mm polyps in sigmoid colon, descending colon, transverse colon, and ascending colon that were resected. An upper endoscopy was performed and showed normal esophagus, stomach, and duodenum.  . Hematuria    a. H/o hematuria 2014 with cystoscopy that was unrevealing for his source of hematuria. He underwent a kidney ultrasound on 10/14 that showed mildly echogenic and scarred kidneys compatible with medical renal disease, without hydronephrosis or renal calculi.  [name] History of blood transfusion    "had colonoscopy done; they had to give me some blood"  . Hyperlipidemia   . Hypertension   . On home oxygen therapy    "2L prn" (07/21/2015); "been off it for awhile" (03/04/2017)  . Renal insufficiency   . 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 Type II diabetes mellitus ([place])   . Wears dentures     Review of Systems  Psychiatric/Behavioral: The patient has insomnia.        Restlessness    Physical Exam:  Vitals:   01/14/18 0924  BP: 120/63  Pulse: 80  Temp: 97.6 F (36.4 C)  TempSrc: Oral  SpO2: 100%  Weight: 198 lb  6.4 oz (90 kg)  Height: 6' (1.829 m)    Constitutional: NAD, appears comfortable Cardiovascular: RRR, systolic murmur Pulmonary/Chest: CTAB, no wheezes, rales, or rhonchi. Large vertical scar on his central sternum, appears to be healing well. C/D/I.  Extremities: Warm and well perfused. No edema. Skin: No rashes or erythema  Psychiatric: Normal mood and affect  Assessment & Plan:   See Encounters Tab for problem based charting.  Patient discussed with Dr. Eppie Gibson

## 2018-01-14 NOTE — Assessment & Plan Note (Addendum)
Patient is here with complaints of restlessness at night, inability to lay still, and frequent pacing around the room.  He reports he has been unable to sleep and keeping his wife up as well. His symptoms are predominately at night.  He is status post CABG at [facility] 1 week ago.  He reports his symptoms were mild prior to surgery but have significantly worsened over the past week.  On chart review, he has chronic anemia secondary to ESRD.  His hemoglobin has slowly been downtrending since January.  Hemoglobin was 11.3 on January 1st, down to 9.5 on March 4.  He denies erythropoietin therapy to his knowledge.  His symptoms are most consistent with restless leg syndrome, likely with iron deficiency anemia and possibly post-op blood loss contributing.  We check iron studies and CBC today and start with a trial of Ropinirole.  Patient understands that this treatment may be temporary if labs are consistent with iron deficiency anemia which can be corrected with iron replacement therapy. -- Start  Ropinirole, 0.25 mg QHS, can increase to 0.5 after 3 days if needed -- Follow up CBC, iron studies  ADDENDUM: Iron studies consistent with anemia of chronic disease, not IDA. Attempted to call patient, unable to leave VM. Will continue with ropinirole therapy.

## 2018-01-15 DIAGNOSIS — R9431 Abnormal electrocardiogram [ECG] [EKG]: Secondary | ICD-10-CM | POA: Diagnosis not present

## 2018-01-15 DIAGNOSIS — N2581 Secondary hyperparathyroidism of renal origin: Secondary | ICD-10-CM | POA: Diagnosis not present

## 2018-01-15 DIAGNOSIS — N186 End stage renal disease: Secondary | ICD-10-CM | POA: Diagnosis not present

## 2018-01-15 DIAGNOSIS — E1129 Type 2 diabetes mellitus with other diabetic kidney complication: Secondary | ICD-10-CM | POA: Diagnosis not present

## 2018-01-15 DIAGNOSIS — D631 Anemia in chronic kidney disease: Secondary | ICD-10-CM | POA: Diagnosis not present

## 2018-01-15 DIAGNOSIS — D509 Iron deficiency anemia, unspecified: Secondary | ICD-10-CM | POA: Diagnosis not present

## 2018-01-15 LAB — CBC
HEMATOCRIT: 35.4 % — AB (ref 37.5–51.0)
HEMOGLOBIN: 10.6 g/dL — AB (ref 13.0–17.7)
MCH: 27.3 pg (ref 26.6–33.0)
MCHC: 29.9 g/dL — ABNORMAL LOW (ref 31.5–35.7)
MCV: 91 fL (ref 79–97)
Platelets: 226 10*3/uL (ref 150–379)
RBC: 3.88 x10E6/uL — AB (ref 4.14–5.80)
RDW: 19.9 % — ABNORMAL HIGH (ref 12.3–15.4)
WBC: 9.8 10*3/uL (ref 3.4–10.8)

## 2018-01-15 LAB — IRON AND TIBC
IRON: 26 ug/dL — AB (ref 38–169)
Iron Saturation: 12 % — ABNORMAL LOW (ref 15–55)
Total Iron Binding Capacity: 220 ug/dL — ABNORMAL LOW (ref 250–450)
UIBC: 194 ug/dL (ref 111–343)

## 2018-01-15 LAB — FERRITIN: Ferritin: 1111 ng/mL — ABNORMAL HIGH (ref 30–400)

## 2018-01-15 NOTE — Progress Notes (Signed)
Case discussed with Dr. Philipp Ovens at the time of the visit. We reviewed the resident's history and exam and pertinent patient test results. I agree with the assessment, diagnosis, and plan of care documented in the resident's note.  Hct actually up to 35.  Iron studies are not consistent with iron deficiency, but an anemia of chronic disease, thus, I do not believe iron replacement will be beneficial at this time.  It would be beneficial as he approaches the need for epo.  Agree with the pharmacologic therapy for the restless legs syndrome as outlined in Dr. Rivka Safer note.

## 2018-01-17 DIAGNOSIS — D631 Anemia in chronic kidney disease: Secondary | ICD-10-CM | POA: Diagnosis not present

## 2018-01-17 DIAGNOSIS — N2581 Secondary hyperparathyroidism of renal origin: Secondary | ICD-10-CM | POA: Diagnosis not present

## 2018-01-17 DIAGNOSIS — E1129 Type 2 diabetes mellitus with other diabetic kidney complication: Secondary | ICD-10-CM | POA: Diagnosis not present

## 2018-01-17 DIAGNOSIS — D509 Iron deficiency anemia, unspecified: Secondary | ICD-10-CM | POA: Diagnosis not present

## 2018-01-17 DIAGNOSIS — N186 End stage renal disease: Secondary | ICD-10-CM | POA: Diagnosis not present

## 2018-01-20 DIAGNOSIS — N2581 Secondary hyperparathyroidism of renal origin: Secondary | ICD-10-CM | POA: Diagnosis not present

## 2018-01-20 DIAGNOSIS — E1129 Type 2 diabetes mellitus with other diabetic kidney complication: Secondary | ICD-10-CM | POA: Diagnosis not present

## 2018-01-20 DIAGNOSIS — N186 End stage renal disease: Secondary | ICD-10-CM | POA: Diagnosis not present

## 2018-01-20 DIAGNOSIS — D509 Iron deficiency anemia, unspecified: Secondary | ICD-10-CM | POA: Diagnosis not present

## 2018-01-20 DIAGNOSIS — D631 Anemia in chronic kidney disease: Secondary | ICD-10-CM | POA: Diagnosis not present

## 2018-01-21 DIAGNOSIS — I130 Hypertensive heart and chronic kidney disease with heart failure and stage 1 through stage 4 chronic kidney disease, or unspecified chronic kidney disease: Secondary | ICD-10-CM | POA: Diagnosis not present

## 2018-01-21 DIAGNOSIS — I5040 Unspecified combined systolic (congestive) and diastolic (congestive) heart failure: Secondary | ICD-10-CM | POA: Diagnosis not present

## 2018-01-21 DIAGNOSIS — Z48812 Encounter for surgical aftercare following surgery on the circulatory system: Secondary | ICD-10-CM | POA: Diagnosis not present

## 2018-01-21 DIAGNOSIS — I2510 Atherosclerotic heart disease of native coronary artery without angina pectoris: Secondary | ICD-10-CM | POA: Diagnosis not present

## 2018-01-21 DIAGNOSIS — N186 End stage renal disease: Secondary | ICD-10-CM | POA: Diagnosis not present

## 2018-01-21 DIAGNOSIS — E1122 Type 2 diabetes mellitus with diabetic chronic kidney disease: Secondary | ICD-10-CM | POA: Diagnosis not present

## 2018-01-22 ENCOUNTER — Encounter: Payer: Self-pay | Admitting: *Deleted

## 2018-01-22 ENCOUNTER — Ambulatory Visit (INDEPENDENT_AMBULATORY_CARE_PROVIDER_SITE_OTHER): Payer: Medicare Other | Admitting: Vascular Surgery

## 2018-01-22 ENCOUNTER — Encounter: Payer: Self-pay | Admitting: Vascular Surgery

## 2018-01-22 ENCOUNTER — Other Ambulatory Visit: Payer: Self-pay | Admitting: *Deleted

## 2018-01-22 VITALS — BP 122/64 | HR 91 | Temp 99.1°F | Resp 18 | Ht 72.0 in | Wt 198.0 lb

## 2018-01-22 DIAGNOSIS — N2581 Secondary hyperparathyroidism of renal origin: Secondary | ICD-10-CM | POA: Diagnosis not present

## 2018-01-22 DIAGNOSIS — I2510 Atherosclerotic heart disease of native coronary artery without angina pectoris: Secondary | ICD-10-CM

## 2018-01-22 DIAGNOSIS — Z9861 Coronary angioplasty status: Secondary | ICD-10-CM | POA: Diagnosis not present

## 2018-01-22 DIAGNOSIS — Z992 Dependence on renal dialysis: Secondary | ICD-10-CM

## 2018-01-22 DIAGNOSIS — N186 End stage renal disease: Secondary | ICD-10-CM

## 2018-01-22 DIAGNOSIS — D509 Iron deficiency anemia, unspecified: Secondary | ICD-10-CM | POA: Diagnosis not present

## 2018-01-22 DIAGNOSIS — E1129 Type 2 diabetes mellitus with other diabetic kidney complication: Secondary | ICD-10-CM | POA: Diagnosis not present

## 2018-01-22 DIAGNOSIS — D631 Anemia in chronic kidney disease: Secondary | ICD-10-CM | POA: Diagnosis not present

## 2018-01-22 NOTE — Progress Notes (Signed)
Patient name: [name] [name] MRN: [id number] DOB: 09/23/1956 Sex: male  REASON FOR VISIT:   Follow-up of left upper arm fistula.  HPI:   [name] [name] is a pleasant 62 y.o. male who underwent plication of the left upper arm fistula on 07/30/2017.  He dialyzes on Monday Wednesdays and Fridays.  I felt that the aneurysms could be addressed in a staged fashion.  He was seen by a physician assistant on 11/27/2017.  He was set up for a 61-month follow-up visit to discuss possible plication of the remaining aneurysm.  Of note the patient was going to [facility] on 11/28/2017 to discuss CABG/MVR.  I felt that he would need to recover from his heart surgery before we could plicate his aneurysm.  He successfully underwent his heart surgery and is done well.  He was discharged about 2 weeks ago.  He has no specific complaints.  He dialyzes on Monday Wednesdays and Fridays and his fistula is working well.  Current Outpatient Medications  Medication Sig Dispense Refill  . aspirin 81 MG chewable tablet Chew 1 tablet (81 mg total) by mouth daily. (Patient taking differently: Chew 81 mg by mouth at bedtime. )    . atorvastatin (LIPITOR) 20 MG tablet Take 20 mg by mouth at bedtime. Reported on 03/14/2016    . carvedilol (COREG) 25 MG tablet Take 1 tablet (25 mg total) by mouth See admin instructions. Take 1 tablet (25 mg) by mouth daily at bedtime, take 1 tablet (25 mg) on Sunday, Tuesday, Thursday, Saturday mornings 30 tablet 1  . metoCLOPramide (REGLAN) 5 MG tablet Take 1 tablet (5 mg total) by mouth 3 (three) times daily as needed for nausea. 90 tablet 1  . nitroGLYCERIN (NITROSTAT) 0.4 MG SL tablet Place 0.4 mg under the tongue every 5 (five) minutes as needed for chest pain.    . sevelamer carbonate (RENVELA) 800 MG tablet Take 4 tablets (3,200 mg total) by mouth 3 (three) times daily with meals. (Patient taking differently: Take 2,400-3,200 mg by mouth See admin instructions. Take 4 tablets (3200 mg) by mouth three  times daily with meals and 3 tablets (2400 mg) with snacks) 320 tablet 0  . albuterol (PROVENTIL HFA;VENTOLIN HFA) 108 (90 Base) MCG/ACT inhaler Inhale 1-2 puffs into the lungs every 6 (six) hours as needed for wheezing or shortness of breath. (Patient not taking: Reported on 12/01/2017) 1 Inhaler 0  . amiodarone (PACERONE) 200 MG tablet Take by mouth.    [name] amLODipine (NORVASC) 10 MG tablet Take by mouth.    . carvedilol (COREG) 6.25 MG tablet Take by mouth.    . cinacalcet (SENSIPAR) 90 MG tablet Take 180 mg by mouth every Monday, Wednesday, and Friday with hemodialysis.     . cloNIDine (CATAPRES) 0.1 MG tablet Take by mouth.    . isosorbide mononitrate (IMDUR) 30 MG 24 hr tablet Take 1 tablet (30 mg total) by mouth daily. (Patient not taking: Reported on 01/22/2018) 30 tablet 11  . multivitamin (RENA-VIT) TABS tablet Take 1 tablet by mouth at bedtime. (Patient not taking: Reported on 01/22/2018) 30 tablet 0  . oxyCODONE (OXY IR/ROXICODONE) 5 MG immediate release tablet     . rOPINIRole (REQUIP) 0.25 MG tablet Take 1 tablet (0.25 mg total) by mouth at bedtime. (Patient not taking: Reported on 01/22/2018) 30 tablet 1   No current facility-administered medications for this visit.     REVIEW OF SYSTEMS:  [X]  denotes positive finding, [ ]  denotes negative finding Cardiac  Comments:  Chest pain or chest pressure:    Shortness of breath upon exertion:    Short of breath when lying flat:    Irregular heart rhythm:    Constitutional    Fever or chills:     PHYSICAL EXAM:   Vitals:   01/22/18 1236  BP: 122/64  Pulse: 91  Resp: 18  Temp: 99.1 F (37.3 C)  TempSrc: Oral  SpO2: 96%  Weight: 198 lb (89.8 kg)  Height: 6' (1.829 m)    GENERAL: The patient is a well-nourished male, in no acute distress. The vital signs are documented above. CARDIOVASCULAR: There is a regular rate and rhythm. PULMONARY: There is good air exchange bilaterally without wheezing or rales. His fistula has an  excellent thrill. 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  The skin overlying this is slightly thinned out.  DATA:   No new data  MEDICAL ISSUES:   ANEURYSMAL LEFT UPPER ARM FISTULA: I think at this point it would be safe to plicate the aneurysm in the central portion of his fistula.  There are now cannulating the proximal segment which was previously plicated.  He just is recovering from his heart surgery and he would like to wait another week or 2 so we have scheduled his plication for 11/08/20.  I have discussed the procedure and potential complications with the patient he is agreeable to proceed.  Deitra Mayo [facility] [phone]

## 2018-01-23 DIAGNOSIS — I5040 Unspecified combined systolic (congestive) and diastolic (congestive) heart failure: Secondary | ICD-10-CM | POA: Diagnosis not present

## 2018-01-23 DIAGNOSIS — E1122 Type 2 diabetes mellitus with diabetic chronic kidney disease: Secondary | ICD-10-CM | POA: Diagnosis not present

## 2018-01-23 DIAGNOSIS — N186 End stage renal disease: Secondary | ICD-10-CM | POA: Diagnosis not present

## 2018-01-23 DIAGNOSIS — Z48812 Encounter for surgical aftercare following surgery on the circulatory system: Secondary | ICD-10-CM | POA: Diagnosis not present

## 2018-01-23 DIAGNOSIS — I2510 Atherosclerotic heart disease of native coronary artery without angina pectoris: Secondary | ICD-10-CM | POA: Diagnosis not present

## 2018-01-23 DIAGNOSIS — I130 Hypertensive heart and chronic kidney disease with heart failure and stage 1 through stage 4 chronic kidney disease, or unspecified chronic kidney disease: Secondary | ICD-10-CM | POA: Diagnosis not present

## 2018-01-26 ENCOUNTER — Encounter (HOSPITAL_COMMUNITY): Payer: Self-pay | Admitting: Emergency Medicine

## 2018-01-26 ENCOUNTER — Emergency Department (HOSPITAL_COMMUNITY): Payer: Medicare Other

## 2018-01-26 ENCOUNTER — Inpatient Hospital Stay (HOSPITAL_COMMUNITY)
Admission: EM | Admit: 2018-01-26 | Discharge: 2018-01-27 | DRG: 640 | Disposition: A | Discharge status: Home / Self Care | Payer: Medicare Other | Attending: Internal Medicine | Admitting: Internal Medicine

## 2018-01-26 DIAGNOSIS — Z888 Allergy status to other drugs, medicaments and biological substances status: Secondary | ICD-10-CM

## 2018-01-26 DIAGNOSIS — Z91040 Latex allergy status: Secondary | ICD-10-CM

## 2018-01-26 DIAGNOSIS — E8770 Fluid overload, unspecified: Secondary | ICD-10-CM

## 2018-01-26 DIAGNOSIS — Z951 Presence of aortocoronary bypass graft: Secondary | ICD-10-CM

## 2018-01-26 DIAGNOSIS — Z9115 Patient's noncompliance with renal dialysis: Secondary | ICD-10-CM

## 2018-01-26 DIAGNOSIS — R079 Chest pain, unspecified: Secondary | ICD-10-CM | POA: Diagnosis not present

## 2018-01-26 DIAGNOSIS — R9431 Abnormal electrocardiogram [ECG] [EKG]: Secondary | ICD-10-CM | POA: Diagnosis not present

## 2018-01-26 DIAGNOSIS — I4891 Unspecified atrial fibrillation: Secondary | ICD-10-CM | POA: Diagnosis present

## 2018-01-26 DIAGNOSIS — K219 Gastro-esophageal reflux disease without esophagitis: Secondary | ICD-10-CM | POA: Diagnosis not present

## 2018-01-26 DIAGNOSIS — R0602 Shortness of breath: Secondary | ICD-10-CM

## 2018-01-26 DIAGNOSIS — E8779 Other fluid overload: Principal | ICD-10-CM | POA: Diagnosis present

## 2018-01-26 DIAGNOSIS — R0789 Other chest pain: Secondary | ICD-10-CM | POA: Diagnosis not present

## 2018-01-26 DIAGNOSIS — R011 Cardiac murmur, unspecified: Secondary | ICD-10-CM | POA: Diagnosis not present

## 2018-01-26 DIAGNOSIS — Z992 Dependence on renal dialysis: Secondary | ICD-10-CM | POA: Diagnosis not present

## 2018-01-26 DIAGNOSIS — R296 Repeated falls: Secondary | ICD-10-CM | POA: Diagnosis present

## 2018-01-26 DIAGNOSIS — Z9981 Dependence on supplemental oxygen: Secondary | ICD-10-CM | POA: Diagnosis not present

## 2018-01-26 DIAGNOSIS — I5032 Chronic diastolic (congestive) heart failure: Secondary | ICD-10-CM | POA: Diagnosis present

## 2018-01-26 DIAGNOSIS — Z87891 Personal history of nicotine dependence: Secondary | ICD-10-CM | POA: Diagnosis not present

## 2018-01-26 DIAGNOSIS — N289 Disorder of kidney and ureter, unspecified: Secondary | ICD-10-CM | POA: Diagnosis not present

## 2018-01-26 DIAGNOSIS — E1122 Type 2 diabetes mellitus with diabetic chronic kidney disease: Secondary | ICD-10-CM | POA: Diagnosis not present

## 2018-01-26 DIAGNOSIS — E785 Hyperlipidemia, unspecified: Secondary | ICD-10-CM | POA: Diagnosis present

## 2018-01-26 DIAGNOSIS — R001 Bradycardia, unspecified: Secondary | ICD-10-CM | POA: Diagnosis present

## 2018-01-26 DIAGNOSIS — Z91048 Other nonmedicinal substance allergy status: Secondary | ICD-10-CM | POA: Diagnosis not present

## 2018-01-26 DIAGNOSIS — I2510 Atherosclerotic heart disease of native coronary artery without angina pectoris: Secondary | ICD-10-CM | POA: Diagnosis not present

## 2018-01-26 DIAGNOSIS — I132 Hypertensive heart and chronic kidney disease with heart failure and with stage 5 chronic kidney disease, or end stage renal disease: Secondary | ICD-10-CM | POA: Diagnosis present

## 2018-01-26 DIAGNOSIS — N186 End stage renal disease: Secondary | ICD-10-CM | POA: Diagnosis present

## 2018-01-26 DIAGNOSIS — Z79899 Other long term (current) drug therapy: Secondary | ICD-10-CM | POA: Diagnosis not present

## 2018-01-26 DIAGNOSIS — Z7982 Long term (current) use of aspirin: Secondary | ICD-10-CM | POA: Diagnosis not present

## 2018-01-26 DIAGNOSIS — E875 Hyperkalemia: Secondary | ICD-10-CM | POA: Diagnosis present

## 2018-01-26 DIAGNOSIS — R42 Dizziness and giddiness: Secondary | ICD-10-CM | POA: Diagnosis not present

## 2018-01-26 DIAGNOSIS — Z952 Presence of prosthetic heart valve: Secondary | ICD-10-CM

## 2018-01-26 DIAGNOSIS — I480 Paroxysmal atrial fibrillation: Secondary | ICD-10-CM | POA: Diagnosis not present

## 2018-01-26 DIAGNOSIS — I340 Nonrheumatic mitral (valve) insufficiency: Secondary | ICD-10-CM | POA: Diagnosis not present

## 2018-01-26 DIAGNOSIS — I120 Hypertensive chronic kidney disease with stage 5 chronic kidney disease or end stage renal disease: Secondary | ICD-10-CM | POA: Diagnosis not present

## 2018-01-26 LAB — RENAL FUNCTION PANEL
ANION GAP: 15 (ref 5–15)
Albumin: 2.6 g/dL — ABNORMAL LOW (ref 3.5–5.0)
BUN: 70 mg/dL — ABNORMAL HIGH (ref 6–20)
CALCIUM: 7.8 mg/dL — AB (ref 8.9–10.3)
CO2: 23 mmol/L (ref 22–32)
Chloride: 105 mmol/L (ref 101–111)
Creatinine, Ser: 11.01 mg/dL — ABNORMAL HIGH (ref 0.61–1.24)
GFR calc non Af Amer: 4 mL/min — ABNORMAL LOW (ref >60)
GFR, EST AFRICAN AMERICAN: 5 mL/min — AB (ref >60)
GLUCOSE: 65 mg/dL (ref 65–99)
POTASSIUM: 5.5 mmol/L — AB (ref 3.5–5.1)
Phosphorus: 5.4 mg/dL — ABNORMAL HIGH (ref 2.5–4.6)
SODIUM: 143 mmol/L (ref 135–145)

## 2018-01-26 LAB — BASIC METABOLIC PANEL
ANION GAP: 20 — AB (ref 5–15)
BUN: 70 mg/dL — ABNORMAL HIGH (ref 6–20)
CHLORIDE: 100 mmol/L — AB (ref 101–111)
CO2: 21 mmol/L — AB (ref 22–32)
CREATININE: 11.44 mg/dL — AB (ref 0.61–1.24)
Calcium: 8.5 mg/dL — ABNORMAL LOW (ref 8.9–10.3)
GFR calc non Af Amer: 4 mL/min — ABNORMAL LOW (ref >60)
GFR, EST AFRICAN AMERICAN: 5 mL/min — AB (ref >60)
Glucose, Bld: 117 mg/dL — ABNORMAL HIGH (ref 65–99)
POTASSIUM: 6 mmol/L — AB (ref 3.5–5.1)
SODIUM: 141 mmol/L (ref 135–145)

## 2018-01-26 LAB — CBC
HEMATOCRIT: 30.6 % — AB (ref 39.0–52.0)
HEMOGLOBIN: 9.3 g/dL — AB (ref 13.0–17.0)
MCH: 26.3 pg (ref 26.0–34.0)
MCHC: 30.4 g/dL (ref 30.0–36.0)
MCV: 86.7 fL (ref 78.0–100.0)
PLATELETS: 220 10*3/uL (ref 150–400)
RBC: 3.53 MIL/uL — AB (ref 4.22–5.81)
RDW: 19.1 % — ABNORMAL HIGH (ref 11.5–15.5)
WBC: 6.4 10*3/uL (ref 4.0–10.5)

## 2018-01-26 LAB — CBG MONITORING, ED: Glucose-Capillary: 80 mg/dL (ref 65–99)

## 2018-01-26 LAB — I-STAT CG4 LACTIC ACID, ED: LACTIC ACID, VENOUS: 2.38 mmol/L — AB (ref 0.5–1.9)

## 2018-01-26 LAB — TROPONIN I: Troponin I: 0.04 ng/mL (ref <0.03)

## 2018-01-26 LAB — I-STAT TROPONIN, ED: Troponin i, poc: 0.02 ng/mL (ref 0.00–0.08)

## 2018-01-26 MED ORDER — SODIUM POLYSTYRENE SULFONATE 15 GM/60ML PO SUSP: 30.0000 g | Freq: Once | ORAL | Status: DC

## 2018-01-26 MED ORDER — INSULIN ASPART 100 UNIT/ML IV SOLN: 10.0000 [IU] | Freq: Once | INTRAVENOUS | Status: DC

## 2018-01-26 MED ORDER — ALBUTEROL SULFATE (2.5 MG/3ML) 0.083% IN NEBU
10.0000 mg | INHALATION_SOLUTION | Freq: Once | RESPIRATORY_TRACT | Status: AC
  Administered 2018-01-26: 10 mg via RESPIRATORY_TRACT
  Filled 2018-01-26: qty 12

## 2018-01-26 MED ORDER — DEXTROSE 50 % IV SOLN
1.0000 | Freq: Once | INTRAVENOUS | Status: AC
  Administered 2018-01-26: 50 mL via INTRAVENOUS
  Filled 2018-01-26: qty 50

## 2018-01-26 MED ORDER — INSULIN ASPART 100 UNIT/ML ~~LOC~~ SOLN
5.0000 [IU] | Freq: Once | SUBCUTANEOUS | Status: AC
  Administered 2018-01-26: 5 [IU] via SUBCUTANEOUS
  Filled 2018-01-26: qty 1

## 2018-01-26 MED ORDER — HEPARIN SODIUM (PORCINE) 5000 UNIT/ML IJ SOLN
5000.0000 [IU] | Freq: Three times a day (TID) | INTRAMUSCULAR | Status: DC
  Administered 2018-01-26 – 2018-01-27 (×2): 5000 [IU] via SUBCUTANEOUS
  Filled 2018-01-26 (×2): qty 1

## 2018-01-26 MED ORDER — SODIUM CHLORIDE 0.9 % IV SOLN
1.0000 g | Freq: Once | INTRAVENOUS | Status: AC
  Administered 2018-01-26: 1 g via INTRAVENOUS
  Filled 2018-01-26: qty 10

## 2018-01-26 MED ORDER — DEXTROSE 50 % IV SOLN: 1.0000 | Freq: Once | INTRAVENOUS | Status: DC

## 2018-01-26 MED ORDER — ACETAMINOPHEN 650 MG RE SUPP: 650.0000 mg | Freq: Four times a day (QID) | RECTAL | Status: DC | PRN

## 2018-01-26 MED ORDER — SEVELAMER CARBONATE 800 MG PO TABS: 2400.0000 mg | ORAL_TABLET | ORAL | Status: DC | PRN

## 2018-01-26 MED ORDER — ATORVASTATIN CALCIUM 40 MG PO TABS: 40.0000 mg | ORAL_TABLET | Freq: Every day | ORAL | Status: DC

## 2018-01-26 MED ORDER — ACETAMINOPHEN 325 MG PO TABS
650.0000 mg | ORAL_TABLET | Freq: Four times a day (QID) | ORAL | Status: DC | PRN
  Administered 2018-01-27: 650 mg via ORAL

## 2018-01-26 MED ORDER — ASPIRIN 81 MG PO CHEW
81.0000 mg | CHEWABLE_TABLET | Freq: Every day | ORAL | Status: DC
  Administered 2018-01-27: 81 mg via ORAL
  Filled 2018-01-26: qty 1

## 2018-01-26 MED ORDER — FENTANYL CITRATE (PF) 100 MCG/2ML IJ SOLN
50.0000 ug | Freq: Once | INTRAMUSCULAR | Status: AC
  Administered 2018-01-26: 50 ug via INTRAVENOUS
  Filled 2018-01-26: qty 2

## 2018-01-26 MED ORDER — INSULIN ASPART 100 UNIT/ML ~~LOC~~ SOLN: 5.0000 [IU] | Freq: Once | SUBCUTANEOUS | Status: DC

## 2018-01-26 NOTE — ED Provider Notes (Addendum)
[facility] EMERGENCY DEPARTMENT Provider Note   CSN: [id number] Arrival date & time: 01/26/18  1651     History   Chief Complaint Chief Complaint  Patient presents with  . Chest Pain    HPI [name] [name] is a 62 y.o. male.  The history is provided by the patient and medical records.  Shortness of Breath  This is a recurrent problem. The average episode lasts 2 days. The problem occurs continuously.The current episode started 2 days ago. The problem has been gradually worsening. Associated symptoms include chest pain and leg swelling. Pertinent negatives include no fever, no headaches, no rhinorrhea, no neck pain, no cough, no sputum production, no hemoptysis, no wheezing, no vomiting, no abdominal pain, no rash and no leg pain. He has tried nothing for the symptoms. The treatment provided no relief. He has had prior hospitalizations. Associated medical issues include CAD, heart failure and recent surgery. Associated medical issues do not include pneumonia.    Past Medical History:  Diagnosis Date  . Anemia   . Anginal pain ([place])   . CAD (coronary artery disease)    a. per [facility] s/p 3.1mm x 32mm Vision BMS to mid LAD 12/2009 and Xience DES to mid LAD 10/2010.  [name] Chronic diastolic CHF (congestive heart failure) ([place])   . Colon polyps   . Daily headache   . ESRD on dialysis [facility]) since ~ 2008   "MWF; Jeneen Rinks" (03/04/2017)  . GERD (gastroesophageal reflux disease)   . Heart murmur   . Hematochezia    a. 2014: colonscopy, which showed moderately-sized internal hemorrhoids, two 54mm polyps in transverse colon and ascending colon that were resected, five 2-36mm polyps in sigmoid colon, descending colon, transverse colon, and ascending colon that were resected. An upper endoscopy was performed and showed normal esophagus, stomach, and duodenum.  . Hematuria    a. H/o hematuria 2014 with cystoscopy that was unrevealing for his source of hematuria. He  underwent a kidney ultrasound on 10/14 that showed mildly echogenic and scarred kidneys compatible with medical renal disease, without hydronephrosis or renal calculi.  [name] History of blood transfusion    "had colonoscopy done; they had to give me some blood"  . Hyperlipidemia   . Hypertension   . On home oxygen therapy    "2L prn" (07/21/2015); "been off it for awhile" (03/04/2017)  . Renal insufficiency   . 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 Type II diabetes mellitus ([place])   . Wears dentures     Patient Active Problem List   Diagnosis Date Noted  . Restless leg syndrome 01/14/2018  . Anemia 01/14/2018  . Tobacco use 10/29/2017  . Mild cognitive impairment 06/13/2017  . Chest pain 06/07/2017  . Skin ulcer of toe of right foot, limited to breakdown of skin ([place])   . Callus of foot 03/07/2017  . Nausea and vomiting 04/02/2016  . ESRD (end stage renal disease) ([place]) 03/18/2016  . Mitral regurgitation   . Diabetic neuropathy ([place]) 07/29/2015  . Depression 07/22/2015  . GERD (gastroesophageal reflux disease) 06/04/2015  . Malnutrition of moderate degree ([place]) 05/17/2015  . Thrombocytopenia ([place]) 05/16/2015  . Weight loss 05/16/2015  . H/O TIA (transient ischemic attack) 04/01/2015  . Diastolic dysfunction-grade 2 12/13/2014  . Hypertension 04/27/2014  . CAD -S/P LAD BMS 2011, LAD DES 2012- patent cors Feb 2016 04/27/2014  . Diabetes mellitus type 2, diet-controlled ([place]) 04/27/2014    Past  Surgical History:  Procedure Laterality Date  . AV FISTULA PLACEMENT Left ~ 2007   "upper arm"  . CARDIAC CATHETERIZATION  "several"  . CORONARY ANGIOPLASTY WITH STENT PLACEMENT  "several"  . CYSTOSCOPY W/ STONE MANIPULATION  X2?  . EYE SURGERY Bilateral    "laser OR for hemorrhage"  . LEFT HEART CATHETERIZATION WITH CORONARY ANGIOGRAM N/A 11/23/2014   Procedure: LEFT HEART CATHETERIZATION WITH CORONARY ANGIOGRAM;  Surgeon: Troy Sine, MD;  Location: [facility]  CATH LAB;  Service: Cardiovascular;  Laterality: N/A;  . LITHOTRIPSY  X1  . REVISON OF ARTERIOVENOUS FISTULA Left 09/98/3382   Procedure: PLICATION OF LEFT ARM ARTERIOVENOUS FISTULA;  Surgeon: Angelia Mould, MD;  Location: [facility];  Service: Vascular;  Laterality: Left;        Home Medications    Prior to Admission medications   Medication Sig Start Date End Date Taking? Authorizing Provider  albuterol (PROVENTIL HFA;VENTOLIN HFA) 108 (90 Base) MCG/ACT inhaler Inhale 1-2 puffs into the lungs every 6 (six) hours as needed for wheezing or shortness of breath. Patient not taking: Reported on 12/01/2017 01/19/16   Delsa Grana, PA-C  amiodarone (PACERONE) 200 MG tablet Take by mouth. 01/10/18 04/10/18  [provider]  amLODipine (NORVASC) 10 MG tablet Take by mouth. 01/07/18   [provider]  aspirin 81 MG chewable tablet Chew 1 tablet (81 mg total) by mouth daily. Patient taking differently: Chew 81 mg by mouth at bedtime.  07/22/15   Iline Oven, MD  atorvastatin (LIPITOR) 20 MG tablet Take 20 mg by mouth at bedtime. Reported on 03/14/2016    [provider]  carvedilol (COREG) 25 MG tablet Take 1 tablet (25 mg total) by mouth See admin instructions. Take 1 tablet (25 mg) by mouth daily at bedtime, take 1 tablet (25 mg) on Sunday, Tuesday, Thursday, Saturday mornings 10/09/17   Molt, Bethany, DO  carvedilol (COREG) 6.25 MG tablet Take by mouth. 01/07/18   [provider]  cinacalcet (SENSIPAR) 90 MG tablet Take 180 mg by mouth every Monday, Wednesday, and Friday with hemodialysis.     [provider]  cloNIDine (CATAPRES) 0.1 MG tablet Take by mouth.    [provider]  isosorbide mononitrate (IMDUR) 30 MG 24 hr tablet Take 1 tablet (30 mg total) by mouth daily. Patient not taking: Reported on 01/22/2018 10/29/17 10/29/18  Alphonzo Grieve, MD  metoCLOPramide (REGLAN) 5 MG tablet Take 1 tablet (5 mg total) by mouth 3 (three) times daily as  needed for nausea. 10/29/17 10/29/18  Alphonzo Grieve, MD  multivitamin (RENA-VIT) TABS tablet Take 1 tablet by mouth at bedtime. Patient not taking: Reported on 01/22/2018 12/15/14   Geradine Girt, DO  nitroGLYCERIN (NITROSTAT) 0.4 MG SL tablet Place 0.4 mg under the tongue every 5 (five) minutes as needed for chest pain.    [provider]  oxyCODONE (OXY IR/ROXICODONE) 5 MG immediate release tablet  01/06/18   [provider]  rOPINIRole (REQUIP) 0.25 MG tablet Take 1 tablet (0.25 mg total) by mouth at bedtime. Patient not taking: Reported on 01/22/2018 01/14/18   Velna Ochs, MD  sevelamer carbonate (RENVELA) 800 MG tablet Take 4 tablets (3,200 mg total) by mouth 3 (three) times daily with meals. Patient taking differently: Take 2,400-3,200 mg by mouth See admin instructions. Take 4 tablets (3200 mg) by mouth three times daily with meals and 3 tablets (2400 mg) with snacks 03/12/16   Geradine Girt, DO    Family History Family History  Problem Relation Age of Onset  . Bone cancer Mother   . Anuerysm Father   . Hypertension Unknown   . Diabetes type II Daughter     Social History Social History   Tobacco Use  . Smoking status: Former Smoker    Packs/day: 0.50    Years: 8.00    Pack years: 4.00    Types: Cigarettes    Last attempt to quit: 10/16/2017    Years since quitting: 0.2  . Smokeless tobacco: Never Used  . Tobacco comment: 3-4 per day.  wants patch  Substance Use Topics  . Alcohol use: No    Alcohol/week: 0.0 oz  . Drug use: No     Allergies   Enalapril; Latex; and Tape   Review of Systems Review of Systems  Constitutional: Negative for chills, diaphoresis, fatigue and fever.  HENT: Negative for congestion and rhinorrhea.   Respiratory: Positive for chest tightness and shortness of breath. Negative for cough, hemoptysis, sputum production, wheezing and stridor.   Cardiovascular: Positive for chest pain and leg swelling. Negative for  palpitations.  Gastrointestinal: Negative for abdominal pain, constipation, diarrhea, nausea and vomiting.  Genitourinary: Negative for dysuria.  Musculoskeletal: Negative for back pain, neck pain and neck stiffness.  Skin: Negative for rash and wound.  Neurological: Negative for light-headedness and headaches.  Psychiatric/Behavioral: Negative for agitation.  All other systems reviewed and are negative.    Physical Exam Updated Vital Signs BP (!) 135/58   Pulse 85   Temp 97.9 F (36.6 C) (Oral)   Resp 20   SpO2 100%   Physical Exam  Constitutional: He is oriented to person, place, and time. He appears well-developed and well-nourished.  Non-toxic appearance. He does not appear ill. No distress.  HENT:  Head: Normocephalic.  Nose: Nose normal.  Mouth/Throat: Oropharynx is clear and moist. No oropharyngeal exudate.  Eyes: Pupils are equal, round, and reactive to light. Conjunctivae and EOM are normal.  Neck: Normal range of motion.  Cardiovascular: Normal rate and intact distal pulses.  Pulmonary/Chest: Effort normal. No respiratory distress. He has no wheezes. He has rales. He exhibits tenderness.  Abdominal: Soft. Bowel sounds are normal. He exhibits no distension. There is no tenderness.  Musculoskeletal: He exhibits no edema or tenderness.  Neurological: He is alert and oriented to person, place, and time. No sensory deficit. He exhibits normal muscle tone.  Skin: Capillary refill takes less than 2 seconds. He is not diaphoretic. No erythema. No pallor.  Nursing note and vitals reviewed.    ED Treatments / Results  Labs (all labs ordered are listed, but only abnormal results are displayed) Labs Reviewed  BASIC METABOLIC PANEL - Abnormal; Notable for the following components:      Result Value   Potassium 6.0 (*)    Chloride 100 (*)    CO2 21 (*)    Glucose, Bld 117 (*)    BUN 70 (*)    Creatinine, Ser 11.44 (*)    Calcium 8.5 (*)    GFR calc non Af Amer 4 (*)     GFR calc Af Amer 5 (*)    Anion gap 20 (*)    All other components within normal limits  CBC - Abnormal; Notable for the following components:   RBC 3.53 (*)    Hemoglobin 9.3 (*)    HCT 30.6 (*)    RDW 19.1 (*)    All other components within normal limits  I-STAT CG4 LACTIC ACID, ED - Abnormal; Notable  for the following components:   Lactic Acid, Venous 2.38 (*)    All other components within normal limits  RENAL FUNCTION PANEL  I-STAT TROPONIN, ED    EKG EKG Interpretation  Date/Time:  Sunday January 26 2018 16:59:41 EDT Ventricular Rate:  60 PR Interval:  164 QRS Duration: 92 QT Interval:  474 QTC Calculation: 474 R Axis:   102 Text Interpretation:  Sinus rhythm with Possible Premature atrial complexes with Abberant conduction Rightward axis Septal infarct , age undetermined Abnormal ECG When compared to prior, sharper t waves.  No STEMI Confirmed by Antony Blackbird [phone]) on 01/26/2018 6:38:46 PM   Radiology Dg Chest 2 View  Result Date: 01/26/2018 CLINICAL DATA:  Chest pain and shortness of breath EXAM: CHEST - 2 VIEW COMPARISON:  12/09/2017 FINDINGS: Cardiac shadow is enlarged but stable. Postsurgical changes are now seen. The lungs are well aerated bilaterally. Mild vascular congestion is again seen with small left pleural effusion. No focal confluent infiltrate is seen. No bony abnormality is noted. IMPRESSION: Mild vascular congestion small left pleural effusion. Electronically Signed   By: Inez Catalina M.D.   On: 01/26/2018 18:06    Procedures Procedures (including critical care time)  CRITICAL CARE Performed by: Gwenyth Allegra Tegeler Total critical care time: 40 minutes Critical care time was exclusive of separately billable procedures and treating other patients. Critical care was necessary to treat or prevent imminent or life-threatening deterioration. Critical care was time spent personally by me on the following activities: development of treatment plan with  patient and/or surrogate as well as nursing, discussions with consultants, evaluation of patient's response to treatment, examination of patient, obtaining history from patient or surrogate, ordering and performing treatments and interventions, ordering and review of laboratory studies, ordering and review of radiographic studies, pulse oximetry and re-evaluation of patient's condition.   Medications Ordered in ED Medications  calcium gluconate 1 g in sodium chloride 0.9 % 100 mL IVPB (0 g Intravenous Stopped 01/26/18 2004)  dextrose 50 % solution 50 mL (50 mLs Intravenous Given 01/26/18 1948)  albuterol (PROVENTIL) (2.5 MG/3ML) 0.083% nebulizer solution 10 mg (10 mg Nebulization Given 01/26/18 1937)  insulin aspart (novoLOG) injection 5 Units (5 Units Subcutaneous Given 01/26/18 1947)  fentaNYL (SUBLIMAZE) injection 50 mcg (50 mcg Intravenous Given 01/26/18 1950)     Initial Impression / Assessment and Plan / ED Course  I have reviewed the triage vital signs and the nursing notes.  Pertinent labs & imaging results that were available during my care of the patient were reviewed by me and considered in my medical decision making (see chart for details).     Alastor Kneale is a 62 y.o. male with a past medical history significant for CHF, CAD status post CABG several weeks ago, ESRD on dialysis M/W/F, prior TIA, and diabetes who presents with shortness of breath, mild chest tightness, fatigue, and edema.  Patient reports that he did not go to dialysis 2 days ago on Friday because he was having some sacral pain after a fall at home.  He reports that he thinks he is fluid overloaded because he missed dialysis.  He reports some shortness of breath, some worsened bilateral lower extremity edema, and feels he has extra fluid.  He reports that he has some chest tightness when he breathes but denies crushing chest pain.  He reports he thinks his discomfort is also on his surgical scar but he does not think he is  having cardiac pain because it feels very different.  He reports that is mild.  He denies any nausea, vomiting, constipation, or diarrhea.  He denies any other complaints.  On exam, patient has crackles in all lung fields.  Patient has edema in lower extremities.  Patient's chest was tender on the surgical site.  No evidence of infection with no purulence or crepitance.  Abdomen nontender.  Patient appears well.  Patient is on room air.    EKG showed peaked T waves compared to prior.  Patient's laboratory testing was performed showing evidence of elevated creatinine and a potassium of 6.0.  I am concerned about hyperkalemia and fluid overload.  Troponin was negative.  Lactic acid slightly elevated however chest x-ray shows no pneumonia.  CBC showed no leukocytosis but there was a mild anemia.  The chest x-ray showed vascular congestion and a small pleural effusion.  Because of the hyperkalemia with EKG changes and the shortness of breath with concern for fluid overload on imaging and  exam, nephrology will be called for management recommendations and possibly dialysis.  Patient will be given medications to treat the hyperkalemia in the ED.    Anticipate admission for further management.  7:51 PM Dr. Jonnie Finner with nephrology callback and agrees with management.  He recommends admitting to medicine and he will come to the patient to likely perform dialysis to manage the fluid overload and hyperkalemia.  Next  Hospitalist team will be called for admission.   Final Clinical Impressions(s) / ED Diagnoses   Final diagnoses:  Hyperkalemia  Hypervolemia, unspecified hypervolemia type  Shortness of breath     Clinical Impression: 1. Hyperkalemia   2. Hypervolemia, unspecified hypervolemia type   3. Shortness of breath     Disposition: Admit  This note was prepared with assistance of Dragon voice recognition software. Occasional wrong-word or sound-a-like substitutions may have occurred due to  the inherent limitations of voice recognition software.      Tegeler, Gwenyth Allegra, MD 01/27/18 0011    Tegeler, Gwenyth Allegra, MD 02/11/18 [phone]

## 2018-01-26 NOTE — Consult Note (Addendum)
Renal Service Consult Note [facility]  [name] [name] 01/26/2018 Sol [place] Requesting Physician:  Dr. Dareen Piano  Reason for Consult:  ESRD pt with  HPI: The patient is a 62 y.o. year-old w history of ESRD, HTN, DM2, home O2 at night and prn, chronic diast CHF, who presented to ED with c/o SOB and chest pains.  Had open heart surg 2-3 wks ago at [facility].  Some dizziness also, missed HD on Friday because of this.    Pt missed HD last week on Friday due to dizziness.  The dizziness started Friday am after he woke up, was not dizzy after his last HD last Wed.  Then on Saturday he had another dizzy episode, he went to the house and laid on the couch and felt a little better.  But then he started feeling that "fluid overload" feeling because he had been real thirsty and felt his legs swelling up.  Today he was not feeling dizzy and was able to walk with the rolling walker, but he felt too much fluid in his body so he came to ED tonight. No CP , fevers or chills, no SOB or cough.  Just had CABG at [facility] in March, no complications.   Started ESRD in 2010 or 2011, kidneys shut due to smoking, HTN, DM per patient.  Goes to HD MWF, usually goes to HD and doesn't usually sign off early per the patient.    LIves w/ his wife in [place], no tobacco or etoh, quit smoking after his CABG.    In ED K was 6.0, EKG showed no QRS widening, VR 60.  On telemetry vent rate is only in 40's.  BP's normal at 120/60.     ROS  denies CP  no joint pain   no HA  no blurry vision  no rash  no diarrhea  no nausea/ vomiting   Past Medical History  Past Medical History:  Diagnosis Date  . Anemia   . Anginal pain ([place])   . CAD (coronary artery disease)    a. per [facility] s/p 3.32mm x 49mm Vision BMS to mid LAD 12/2009 and Xience DES to mid LAD 10/2010.  [name] Chronic diastolic CHF (congestive heart failure) ([place])   . Colon polyps   . Daily headache   . ESRD on dialysis [facility]) since ~ 2008   "MWF;  Jeneen Rinks" (03/04/2017)  . GERD (gastroesophageal reflux disease)   . Heart murmur   . Hematochezia    a. 2014: colonscopy, which showed moderately-sized internal hemorrhoids, two 69mm polyps in transverse colon and ascending colon that were resected, five 2-8mm polyps in sigmoid colon, descending colon, transverse colon, and ascending colon that were resected. An upper endoscopy was performed and showed normal esophagus, stomach, and duodenum.  . Hematuria    a. H/o hematuria 2014 with cystoscopy that was unrevealing for his source of hematuria. He underwent a kidney ultrasound on 10/14 that showed mildly echogenic and scarred kidneys compatible with medical renal disease, without hydronephrosis or renal calculi.  [name] History of blood transfusion    "had colonoscopy done; they had to give me some blood"  . Hyperlipidemia   . Hypertension   . On home oxygen therapy    "2L prn" (07/21/2015); "been off it for awhile" (03/04/2017)  . Renal insufficiency   . 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 Type II diabetes mellitus ([place])   . Wears dentures  Past Surgical History  Past Surgical History:  Procedure Laterality Date  . AV FISTULA PLACEMENT Left ~ 2007   "upper arm"  . CARDIAC CATHETERIZATION  "several"  . CORONARY ANGIOPLASTY WITH STENT PLACEMENT  "several"  . CYSTOSCOPY W/ STONE MANIPULATION  X2?  . EYE SURGERY Bilateral    "laser OR for hemorrhage"  . LEFT HEART CATHETERIZATION WITH CORONARY ANGIOGRAM N/A 11/23/2014   Procedure: LEFT HEART CATHETERIZATION WITH CORONARY ANGIOGRAM;  Surgeon: Troy Sine, MD;  Location: [facility] CATH LAB;  Service: Cardiovascular;  Laterality: N/A;  . LITHOTRIPSY  X1  . REVISON OF ARTERIOVENOUS FISTULA Left 35/00/9381   Procedure: PLICATION OF LEFT ARM ARTERIOVENOUS FISTULA;  Surgeon: Angelia Mould, MD;  Location: [facility] OR;  Service: Vascular;  Laterality: Left;   Family History  Family History  Problem Relation Age of  Onset  . Bone cancer Mother   . Anuerysm Father   . Hypertension Unknown   . Diabetes type II Daughter    Social History  reports that he quit smoking about 3 months ago. His smoking use included cigarettes. He has a 4.00 pack-year smoking history. He has never used smokeless tobacco. He reports that he does not drink alcohol or use drugs. Allergies  Allergies  Allergen Reactions  . Enalapril Hives and Rash  . Latex Rash  . Tape Rash    TAPE MAKES SKIN BREAK OUT AND TURN RED   Home medications Prior to Admission medications   Medication Sig Start Date End Date Taking? Authorizing Provider  albuterol (PROVENTIL HFA;VENTOLIN HFA) 108 (90 Base) MCG/ACT inhaler Inhale 1-2 puffs into the lungs every 6 (six) hours as needed for wheezing or shortness of breath. Patient not taking: Reported on 12/01/2017 01/19/16   Delsa Grana, PA-C  amiodarone (PACERONE) 200 MG tablet Take by mouth. 01/10/18 04/10/18  [provider]  amLODipine (NORVASC) 10 MG tablet Take by mouth. 01/07/18   [provider]  aspirin 81 MG chewable tablet Chew 1 tablet (81 mg total) by mouth daily. Patient taking differently: Chew 81 mg by mouth at bedtime.  07/22/15   Iline Oven, MD  atorvastatin (LIPITOR) 20 MG tablet Take 20 mg by mouth at bedtime. Reported on 03/14/2016    [provider]  carvedilol (COREG) 25 MG tablet Take 1 tablet (25 mg total) by mouth See admin instructions. Take 1 tablet (25 mg) by mouth daily at bedtime, take 1 tablet (25 mg) on Sunday, Tuesday, Thursday, Saturday mornings 10/09/17   Molt, Bethany, DO  carvedilol (COREG) 6.25 MG tablet Take by mouth. 01/07/18   [provider]  cinacalcet (SENSIPAR) 90 MG tablet Take 180 mg by mouth every Monday, Wednesday, and Friday with hemodialysis.     [provider]  cloNIDine (CATAPRES) 0.1 MG tablet Take by mouth.    [provider]  isosorbide mononitrate (IMDUR) 30 MG 24 hr tablet Take 1 tablet (30 mg  total) by mouth daily. Patient not taking: Reported on 01/22/2018 10/29/17 10/29/18  Alphonzo Grieve, MD  metoCLOPramide (REGLAN) 5 MG tablet Take 1 tablet (5 mg total) by mouth 3 (three) times daily as needed for nausea. 10/29/17 10/29/18  Alphonzo Grieve, MD  multivitamin (RENA-VIT) TABS tablet Take 1 tablet by mouth at bedtime. Patient not taking: Reported on 01/22/2018 12/15/14   Geradine Girt, DO  nitroGLYCERIN (NITROSTAT) 0.4 MG SL tablet Place 0.4 mg under the tongue every 5 (five) minutes as needed for chest pain.    [provider]  oxyCODONE (  OXY IR/ROXICODONE) 5 MG immediate release tablet  01/06/18   [provider]  rOPINIRole (REQUIP) 0.25 MG tablet Take 1 tablet (0.25 mg total) by mouth at bedtime. Patient not taking: Reported on 01/22/2018 01/14/18   Velna Ochs, MD  sevelamer carbonate (RENVELA) 800 MG tablet Take 4 tablets (3,200 mg total) by mouth 3 (three) times daily with meals. Patient taking differently: Take 2,400-3,200 mg by mouth See admin instructions. Take 4 tablets (3200 mg) by mouth three times daily with meals and 3 tablets (2400 mg) with snacks 03/12/16   Geradine Girt, DO   Liver Function Tests No results for input(s): AST, ALT, ALKPHOS, BILITOT, PROT, ALBUMIN in the last 168 hours. No results for input(s): LIPASE, AMYLASE in the last 168 hours. CBC Recent Labs  Lab 01/26/18 1736  WBC 6.4  HGB 9.3*  HCT 30.6*  MCV 86.7  PLT 974   Basic Metabolic Panel Recent Labs  Lab 01/26/18 1736  NA 141  K 6.0*  CL 100*  CO2 21*  GLUCOSE 117*  BUN 70*  CREATININE 11.44*  CALCIUM 8.5*   Iron/TIBC/Ferritin/ %Sat    Component Value Date/Time   IRON 26 (L) 01/14/2018 1030   TIBC 220 (L) 01/14/2018 1030   FERRITIN 1,111 (H) 01/14/2018 1030   IRONPCTSAT 12 (L) 01/14/2018 1030    Vitals:   01/26/18 2015 01/26/18 2030 01/26/18 2045 01/26/18 2115  BP: 129/64 119/60 (!) 103/54 (!) 124/58  Pulse: (!) 41 (!) 42 (!) 44 (!) 42  Resp: 17 16 17 19    Temp:      TempSrc:      SpO2: 98% 94% 97% 100%   Exam Gen alert,  No distress No rash, cyanosis or gangrene Sclera anicteric, throat clear  +jvd, no bruits Chest bibasilar crackles, no ^WOB RRR no MRG Abd soft ntnd no mass or ascites +bs  GU normal male MS no joint effusions or deformity Ext no LE edema, no wounds or ulcer Neuro is alert, Ox 3 , nf LUA AVF+bruit   Dialysis: MWF GOC - records pending   Impression: 1  Hyperkalemia - due to missed HD Friday 2  Vol overload/ SOB/ pulm edema  3  ESRD on HD MWF 4  HTN - cont BP meds 5  DM2 - insulin per primary 6  Chest pain - recent CABG, trop neg x 2 and EKG w/o ischemic changes   Plan - plan semi-urgent HD tonight for hyperkalemia and pulm edema  Kelly Splinter MD [place] pager [phone]   01/26/2018, 9:29 PM

## 2018-01-26 NOTE — Progress Notes (Signed)
Report received from ED RN, Cricket.  Patient glucose 65 at 2227.  HD requesting sugar to be corrected, preferably over 70 as dialysis will drop patients sugar further.  ED to recheck glucose prior to bringing patient to unit.  Will continue to monitor.

## 2018-01-26 NOTE — ED Notes (Signed)
Triage nurse [place] aware of pt Lactic Acid results.

## 2018-01-26 NOTE — ED Notes (Signed)
Placed pt on monitor per provider request

## 2018-01-26 NOTE — ED Triage Notes (Signed)
Patient states he also did not go to his dialysis treatment on Friday due to frustrations with his current condition and "just not wanting to be bothered".  Denies SI.  Patient states weight gain and swelling.  Hx of CHF

## 2018-01-26 NOTE — H&P (Addendum)
Date: 01/27/2018               Patient Name:  [name] [name] MRN: [id number]  DOB: 06-08-1956 Age / Sex: 62 y.o., male   PCP: Neva Seat, MD         Medical Service: Internal Medicine Teaching Service         Attending Physician: Dr. Aldine Contes, MD    First Contact: Dr. Johny Chess Pager: [phone]  Second Contact: Dr. Heber [place] Pager: [phone]       After Hours (After 5p/  First Contact Pager: [phone]  weekends / holidays): Second Contact Pager: [phone]   Chief Complaint: fluid overload  History of Present Illness:  62 yo male with PMH ESRD on HD MWF, HTN, CAD s/p CABG, and mitral valve regurgitation s/p MVR presenting with multiple complaints. Patient states that he was unable to attend HD on Friday (4/19) because he was not feeling well. He states he was experiencing multiple episodes of dizziness resulting in falls. One episode occurred after he got up from bed in the morning and the other occurred after he was walking the neighborhood. He denies LOC or head trauma, and can remember the events occurring. He denies chest pain with these events, but endorses mild dyspnea on exertion. He also had associated nausea and vomiting with these episodes. He thought he would be able to wait until Monday to go to dialysis, but noticed he was retaining fluid in his legs and was have right sided chest pain that he attributes to fluid overload. This chest pain is constant, pressure like in quality and not pleuritic, not worsened by movement. He states this pressure sensation is not similar to his previous anginal CP episodes. He believes this pain is related to his healing chest wall incision in combination with the fluid overload from missing HD.    He denies fevers, chills, palpitations, orthopnea, dysuria, hematuria, diarrhea, or constipation. He endorses cold like symptoms for several weeks durations with associated cough that also causes him to have CP.  He states since his CABG/MVR and  discharge from [facility] he has had no issues. Per chart review in care everywhere, he had two visits to the ED for chest pain and generalized weakness. He had an elevated troponin to 0.8, but declined admission on 4/8.   ED Course:  Vitals: Blood pressure 135/58, pulse 85, temperature 97.9 F (36.6 C), resp. rate (!) 22, SpO2 97 %. Labs: K 6.0, CO2 21, BUN 70, AG 20; Hgb 9.3, WBC 6.4; I-stat troponin negative, LA 2.38 Meds: albuterol, 5 U of Novolog, fentanyl, calcium gluconate Consults: Nephrology  Meds:  Current Meds  Medication Sig  . amiodarone (PACERONE) 200 MG tablet Take 200 mg by mouth daily.   [name] amLODipine (NORVASC) 10 MG tablet Take 10 mg by mouth at bedtime.   [name] aspirin EC 81 MG tablet Take 81 mg by mouth daily.  [name] atorvastatin (LIPITOR) 20 MG tablet Take 20 mg by mouth at bedtime. Reported on 03/14/2016  . carvedilol (COREG) 6.25 MG tablet Take 6.25 mg by mouth 2 (two) times daily with a meal.   . cinacalcet (SENSIPAR) 90 MG tablet Take 180 mg by mouth every Monday, Wednesday, and Friday with hemodialysis.   [name] doxycycline (VIBRAMYCIN) 100 MG capsule Take 100 mg by mouth See admin instructions. 7 day course started 01/20/18 - take one capsule (100 mg) by mouth twice daily - take 2 hours before or after binders & renal vitamin  . isosorbide  mononitrate (IMDUR) 30 MG 24 hr tablet Take 1 tablet (30 mg total) by mouth daily. (Patient taking differently: Take 30 mg by mouth daily as needed (chest pain). )  . nitroGLYCERIN (NITROSTAT) 0.4 MG SL tablet Place 0.4 mg under the tongue every 5 (five) minutes as needed for chest pain.  [name] rOPINIRole (REQUIP) 0.25 MG tablet Take 1 tablet (0.25 mg total) by mouth at bedtime.  . sevelamer carbonate (RENVELA) 800 MG tablet Take 4 tablets (3,200 mg total) by mouth 3 (three) times daily with meals. (Patient taking differently: Take 2,400-3,200 mg by mouth See admin instructions. Take 4 tablets (3200 mg) by mouth three times daily with meals and 3 tablets (2400  mg) with snacks)     Allergies: Allergies as of 01/26/2018 - Review Complete 01/26/2018  Allergen Reaction Noted  . Enalapril Hives and Rash 04/26/2014  . Latex Rash 09/23/2016  . Tape Rash 09/23/2016   Past Medical History:  Diagnosis Date  . Anemia   . Anginal pain ([place])   . CAD (coronary artery disease)    a. per [facility] s/p 3.66mm x 72mm Vision BMS to mid LAD 12/2009 and Xience DES to mid LAD 10/2010.  [name] Chronic diastolic CHF (congestive heart failure) ([place])   . Colon polyps   . Daily headache   . ESRD on dialysis [facility]) since ~ 2008   "MWF; Jeneen Rinks" (03/04/2017)  . GERD (gastroesophageal reflux disease)   . Heart murmur   . Hematochezia    a. 2014: colonscopy, which showed moderately-sized internal hemorrhoids, two 5mm polyps in transverse colon and ascending colon that were resected, five 2-36mm polyps in sigmoid colon, descending colon, transverse colon, and ascending colon that were resected. An upper endoscopy was performed and showed normal esophagus, stomach, and duodenum.  . Hematuria    a. H/o hematuria 2014 with cystoscopy that was unrevealing for his source of hematuria. He underwent a kidney ultrasound on 10/14 that showed mildly echogenic and scarred kidneys compatible with medical renal disease, without hydronephrosis or renal calculi.  [name] History of blood transfusion    "had colonoscopy done; they had to give me some blood"  . Hyperlipidemia   . Hypertension   . On home oxygen therapy    "2L prn" (07/21/2015); "been off it for awhile" (03/04/2017)  . Renal insufficiency   . 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 Type II diabetes mellitus ([place])   . Wears dentures     Family History:  Family History  Problem Relation Age of Onset  . Bone cancer Mother   . Anuerysm Father   . Hypertension Unknown   . Diabetes type II Daughter     Social History:  Social History   Tobacco Use  . Smoking status: Former Smoker     Packs/day: 0.50    Years: 8.00    Pack years: 4.00    Types: Cigarettes    Last attempt to quit: 10/16/2017    Years since quitting: 0.2  . Smokeless tobacco: Never Used  . Tobacco comment: 3-4 per day.  wants patch  Substance Use Topics  . Alcohol use: No    Alcohol/week: 0.0 oz  . Drug use: No    Review of Systems: A complete ROS was negative except as per HPI.   Physical Exam: Blood pressure 138/76, pulse 83, temperature (!) 97.2 F (36.2 C), resp. rate (!) 23, height 6' (1.829 m), weight 208 lb 1.8 oz (94.4  kg), SpO2 95 %. Physical Exam  Constitutional: He is oriented to person, place, and time. He appears well-developed and well-nourished. No distress.  HENT:  Head: Normocephalic and atraumatic.  Mouth/Throat: Oropharynx is clear and moist.  Eyes: Conjunctivae are normal. No scleral icterus.  Neck: Normal range of motion. Neck supple. JVD present.  Cardiovascular: Regular rhythm and intact distal pulses. Bradycardia present.  Pulmonary/Chest: Effort normal. He has rales (Bibasilar crackles; ) in the right lower field and the left lower field.  Midline sternotomy incision site healing well without erythema or drainage; +chest wall tenderness (right anterior chest wall)  Abdominal: Soft. Bowel sounds are normal. There is no tenderness.  Musculoskeletal: Normal range of motion. He exhibits edema (+1 pitting edema b/l LE).  Neurological: He is alert and oriented to person, place, and time. He has normal strength. No cranial nerve deficit.  Skin: Skin is warm, dry and intact.    EKG: personally reviewed my interpretation is sinus rhythm, HR 60, PACs, peaked t waves   CXR: personally reviewed my interpretation is negative for focal opacity; mild vascular congestion and small left pleural effusion   Assessment & Plan by Problem: Active Problems:   Hypervolemia associated with renal insufficiency   Hyperkalemia  Hypervolemia  ESRD on HD MWF Hyperkalemia  Patient presenting  after missing HD session. On exam patient has signs of volume overload with bibasilar crackles and JVD, CXR with vascular congestion. Labs notable for hyperkalemia to 6.0 and AG of 20, CO2 21. Patient given insulin, albuterol, and calcium gluconate. -Nephrology consutled and patient will undergo semi-urgent HD tonight for hyperkalemia and pulmonary edema.  -Nephrology on appreciate recs -STAT repeat renal function panel  -Calcium gluconate   Bradycardia  Patient with HR into the 40s during examination. Repeat EKG with sinus bradycardia, no AV block or QRS widening. May be related to hyperkalemia, medications such as coreg and amiodarone, as well as recent open heart surgery.  -Cardiac Telemetry -Holding Norvasc -Holding Coreg  -Holding Amiodarone   Chest Pain CAD s/p CABG Patient's chest pain atypical and reproducible on examination. EKG without ischemic changes and I-stat troponin negative x1. Patient's chest pain likely multifactorial: volume overload, recent surgery and incision site soreness/musculoskeletal. Likely not secondary to ACS but given patient's significant cardiac history will get repeat troponin.  -Troponin  -Cardiac Telemetry  -Continue ASA 81 mg  -Continue Lipitor 40 mg  -Holding Coreg 2/2 to bradycardia  Dizziness Patient has been experiencing intermittent episodes of dizziness. Patient's symptoms may be related to orthostatic hypotension, arrhythmia, intermittent bradycardia, or deconditioning from recent CABG and MVR. No focal neurological deficits on examination.  -Cardiac monitoring  -Orthostatic vital signs - negative -Holding BP meds -Recommend PT/OT  HTN Blood pressure 134/58.  -Holding BP meds due to bradycardia and normotension   Diet: Renal w/ fluid restriction DVT ppx: hep SQ  Code Status: Full code, confirmed on admission  Dispo: Admit patient to Inpatient with expected length of stay greater than 2 midnights.  Signed: Melanee Spry,  MD 01/27/2018, 6:30 AM  Pager: [phone]

## 2018-01-26 NOTE — ED Triage Notes (Signed)
Patient presents to ED for assessment of SOB, CP (patient believes its surgical from his open heart scar x 2-3 weeks ago), nausea and vomiting, light-headedness and dizziness.

## 2018-01-27 ENCOUNTER — Other Ambulatory Visit: Payer: Self-pay

## 2018-01-27 DIAGNOSIS — Z87891 Personal history of nicotine dependence: Secondary | ICD-10-CM

## 2018-01-27 DIAGNOSIS — I480 Paroxysmal atrial fibrillation: Secondary | ICD-10-CM | POA: Diagnosis not present

## 2018-01-27 DIAGNOSIS — N186 End stage renal disease: Secondary | ICD-10-CM | POA: Diagnosis not present

## 2018-01-27 DIAGNOSIS — E875 Hyperkalemia: Secondary | ICD-10-CM

## 2018-01-27 DIAGNOSIS — E8770 Fluid overload, unspecified: Secondary | ICD-10-CM

## 2018-01-27 DIAGNOSIS — Z992 Dependence on renal dialysis: Secondary | ICD-10-CM

## 2018-01-27 DIAGNOSIS — N289 Disorder of kidney and ureter, unspecified: Secondary | ICD-10-CM

## 2018-01-27 DIAGNOSIS — I132 Hypertensive heart and chronic kidney disease with heart failure and with stage 5 chronic kidney disease, or end stage renal disease: Secondary | ICD-10-CM | POA: Diagnosis not present

## 2018-01-27 DIAGNOSIS — Z888 Allergy status to other drugs, medicaments and biological substances status: Secondary | ICD-10-CM

## 2018-01-27 DIAGNOSIS — I2510 Atherosclerotic heart disease of native coronary artery without angina pectoris: Secondary | ICD-10-CM | POA: Diagnosis not present

## 2018-01-27 DIAGNOSIS — Z951 Presence of aortocoronary bypass graft: Secondary | ICD-10-CM | POA: Diagnosis not present

## 2018-01-27 DIAGNOSIS — R296 Repeated falls: Secondary | ICD-10-CM | POA: Diagnosis not present

## 2018-01-27 DIAGNOSIS — I5032 Chronic diastolic (congestive) heart failure: Secondary | ICD-10-CM | POA: Diagnosis not present

## 2018-01-27 DIAGNOSIS — R42 Dizziness and giddiness: Secondary | ICD-10-CM

## 2018-01-27 DIAGNOSIS — R9431 Abnormal electrocardiogram [ECG] [EKG]: Secondary | ICD-10-CM

## 2018-01-27 DIAGNOSIS — I340 Nonrheumatic mitral (valve) insufficiency: Secondary | ICD-10-CM | POA: Diagnosis not present

## 2018-01-27 DIAGNOSIS — R0789 Other chest pain: Secondary | ICD-10-CM

## 2018-01-27 DIAGNOSIS — Z79899 Other long term (current) drug therapy: Secondary | ICD-10-CM

## 2018-01-27 DIAGNOSIS — R011 Cardiac murmur, unspecified: Secondary | ICD-10-CM

## 2018-01-27 DIAGNOSIS — E785 Hyperlipidemia, unspecified: Secondary | ICD-10-CM | POA: Diagnosis not present

## 2018-01-27 DIAGNOSIS — E1122 Type 2 diabetes mellitus with diabetic chronic kidney disease: Secondary | ICD-10-CM | POA: Diagnosis not present

## 2018-01-27 DIAGNOSIS — I120 Hypertensive chronic kidney disease with stage 5 chronic kidney disease or end stage renal disease: Secondary | ICD-10-CM | POA: Diagnosis not present

## 2018-01-27 DIAGNOSIS — Z7982 Long term (current) use of aspirin: Secondary | ICD-10-CM

## 2018-01-27 DIAGNOSIS — Z952 Presence of prosthetic heart valve: Secondary | ICD-10-CM

## 2018-01-27 DIAGNOSIS — R001 Bradycardia, unspecified: Secondary | ICD-10-CM | POA: Diagnosis not present

## 2018-01-27 DIAGNOSIS — E8779 Other fluid overload: Secondary | ICD-10-CM | POA: Diagnosis not present

## 2018-01-27 DIAGNOSIS — Z8249 Family history of ischemic heart disease and other diseases of the circulatory system: Secondary | ICD-10-CM

## 2018-01-27 DIAGNOSIS — Z91040 Latex allergy status: Secondary | ICD-10-CM

## 2018-01-27 DIAGNOSIS — I4891 Unspecified atrial fibrillation: Secondary | ICD-10-CM | POA: Diagnosis not present

## 2018-01-27 DIAGNOSIS — Z9181 History of falling: Secondary | ICD-10-CM

## 2018-01-27 DIAGNOSIS — K219 Gastro-esophageal reflux disease without esophagitis: Secondary | ICD-10-CM | POA: Diagnosis not present

## 2018-01-27 DIAGNOSIS — Z91048 Other nonmedicinal substance allergy status: Secondary | ICD-10-CM

## 2018-01-27 LAB — RENAL FUNCTION PANEL
Albumin: 2.7 g/dL — ABNORMAL LOW (ref 3.5–5.0)
Anion gap: 12 (ref 5–15)
BUN: 30 mg/dL — ABNORMAL HIGH (ref 6–20)
CO2: 25 mmol/L (ref 22–32)
CREATININE: 6.37 mg/dL — AB (ref 0.61–1.24)
Calcium: 7.9 mg/dL — ABNORMAL LOW (ref 8.9–10.3)
Chloride: 99 mmol/L — ABNORMAL LOW (ref 101–111)
GFR calc non Af Amer: 9 mL/min — ABNORMAL LOW (ref >60)
GFR, EST AFRICAN AMERICAN: 10 mL/min — AB (ref >60)
Glucose, Bld: 82 mg/dL (ref 65–99)
Phosphorus: 4 mg/dL (ref 2.5–4.6)
Potassium: 4.8 mmol/L (ref 3.5–5.1)
Sodium: 136 mmol/L (ref 135–145)

## 2018-01-27 LAB — LACTIC ACID, PLASMA
LACTIC ACID, VENOUS: 1.2 mmol/L (ref 0.5–1.9)
LACTIC ACID, VENOUS: 1.9 mmol/L (ref 0.5–1.9)

## 2018-01-27 LAB — MRSA PCR SCREENING: MRSA by PCR: NEGATIVE

## 2018-01-27 MED ORDER — ACETAMINOPHEN 325 MG PO TABS
ORAL_TABLET | ORAL | Status: AC
Start: 2018-01-27 — End: 2018-01-27
  Administered 2018-01-27: 650 mg via ORAL
  Filled 2018-01-27: qty 2

## 2018-01-27 MED ORDER — HEPARIN SODIUM (PORCINE) 1000 UNIT/ML DIALYSIS
8000.0000 [IU] | Freq: Once | INTRAMUSCULAR | Status: AC
  Administered 2018-01-27: 8000 [IU] via INTRAVENOUS_CENTRAL

## 2018-01-27 MED ORDER — SEVELAMER CARBONATE 800 MG PO TABS: 3200.0000 mg | ORAL_TABLET | Freq: Three times a day (TID) | ORAL | Status: DC

## 2018-01-27 MED ORDER — PENTAFLUOROPROP-TETRAFLUOROETH EX AERO: 1.0000 "application " | INHALATION_SPRAY | CUTANEOUS | Status: DC | PRN

## 2018-01-27 MED ORDER — SODIUM CHLORIDE 0.9 % IV SOLN: 100.0000 mL | INTRAVENOUS | Status: DC | PRN

## 2018-01-27 MED ORDER — SEVELAMER CARBONATE 800 MG PO TABS
3200.0000 mg | ORAL_TABLET | Freq: Three times a day (TID) | ORAL | Status: DC
  Administered 2018-01-27 (×3): 3200 mg via ORAL
  Filled 2018-01-27 (×3): qty 4

## 2018-01-27 MED ORDER — OXYCODONE HCL 5 MG PO TABS
ORAL_TABLET | ORAL | Status: AC
  Administered 2018-01-27: 10 mg via ORAL
  Filled 2018-01-27: qty 2

## 2018-01-27 MED ORDER — OXYCODONE HCL 5 MG PO TABS
10.0000 mg | ORAL_TABLET | Freq: Once | ORAL | Status: AC
  Administered 2018-01-27: 10 mg via ORAL

## 2018-01-27 MED ORDER — LIDOCAINE-PRILOCAINE 2.5-2.5 % EX CREA: 1.0000 "application " | TOPICAL_CREAM | CUTANEOUS | Status: DC | PRN

## 2018-01-27 NOTE — Consult Note (Addendum)
Cardiology Consultation:   Patient ID: [name] [name]; [id number]; [birth year]   Admit date: 01/26/2018 Date of Consult: 01/27/2018  Primary Care Provider: Neva Seat, MD Primary Cardiologist: [facility] Primary Electrophysiologist:     Patient Profile:   [name] [name] is a 62 y.o. male with a hx of CAD s/p recent CABG and MVR at [facility], ESRD on HD, DM, hx of TB, HTN, HLD, GERD, and chronic diastolic heart failure who is being seen today for the evaluation of bradycardia at the request of Dr. Dareen Piano.  History of Present Illness:   [name] [name] has a complex cardiac history. He has a hx of CAD with prior PCI to LAD (2011, 2012), but then lost to follow up. He was eein 2017 with TTE showing moderate MR. He has a history of ESRD on HD x 10 years. He reports chest pain, orthopnea, and SOB with HD sessions. He was evaluated at [facility] and underwent heart cat 11/2017 with multivessel disease (80% stenosis in mid LAD at the bifurcation of a moderate sized diagonal that as 90% stenosed at the ostium). He also has antero-lateral wall hypokinesis and moderate to severe mitral regurgitation. He was referred to CTS and underwent CABG and MVR on 12/26/17. HTN medications were titrated and he was discharged on his regular HD schedule. Recovery was complicated by post-op Afib for which he was discharged on amiodarone and coreg.  He reported to [facility] ED on 01/13/18 with chest pain since surgery and found to have an elevated troponin, but left the ER AMA prior to admission. He then reported to [facility] hospital ED on 01/14/18 with chest pain - appears that he left without being evaluated. The patient then did not show for his follow up appt with [facility] CTS on 4/11 and 4/16. He was rescheduled for 02/04/18.  He presented to [facility] with hypervolemia and progressive SOB after missing his Friday HD session.  He missed HD on Friday due to multiple episodes of lightheadedness resulting in falls. He denies head trauma. Cardiology is asked  to evaluate for symptomatic bradycardia on amiodarone and coreg.   On my interview, he was lightheaded on Friday and Sat only. He came to the ER on Sunday for fluid overload after missing HD on Friday. He denies chest pain, palpitations, and dizziness today. He has walked the halls with assistance and was asymptomatic with appropriate physiological response to activity. While in the room, EKG was obtained that showed sinus bradycardia with PVCs and sinus arrhythmia. While I was in the room, his HR fluctuated drastically from 80s to 40s. He remained asymptomatic. Telemetry initially concern for afib, but upon further review, this is likely sinus with sinus arrhythmia.     Past Medical History:  Diagnosis Date  . Anemia   . Anginal pain (Erma)   . CAD (coronary artery disease)    a. per [facility] s/p 3.19m x 170mVision BMS to mid LAD 12/2009 and Xience DES to mid LAD 10/2010.  . Marland Kitchenhronic diastolic CHF (congestive heart failure) (HCToomsuba  . Colon polyps   . Daily headache   . [facility]   "[facility]; [facility](03/04/2017)  . GERD (gastroesophageal reflux disease)   . Heart murmur   . Hematochezia    a. 2014: colonscopy, which showed moderately-sized internal hemorrhoids, two 13m39molyps in transverse colon and ascending colon that were resected, five 2-3mm12mlyps in sigmoid colon, descending colon, transverse colon, and ascending colon that were resected. An upper endoscopy  was performed and showed normal esophagus, stomach, and duodenum.  . Hematuria    a. H/o hematuria 2014 with cystoscopy that was unrevealing for his source of hematuria. He underwent a kidney ultrasound on 10/14 that showed mildly echogenic and scarred kidneys compatible with medical renal disease, without hydronephrosis or renal calculi.  [name] History of blood transfusion    "had colonoscopy done; they had to give me some blood"  . Hyperlipidemia   . Hypertension   . On home oxygen therapy    "2L prn"  (07/21/2015); "been off it for awhile" (03/04/2017)  . Renal insufficiency   . 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 Type II diabetes mellitus ([place])   . Wears dentures     Past Surgical History:  Procedure Laterality Date  . AV FISTULA PLACEMENT Left ~ 2007   "upper arm"  . CARDIAC CATHETERIZATION  "several"  . CORONARY ANGIOPLASTY WITH STENT PLACEMENT  "several"  . CYSTOSCOPY W/ STONE MANIPULATION  X2?  . EYE SURGERY Bilateral    "laser OR for hemorrhage"  . LEFT HEART CATHETERIZATION WITH CORONARY ANGIOGRAM N/A 11/23/2014   Procedure: LEFT HEART CATHETERIZATION WITH CORONARY ANGIOGRAM;  Surgeon: Troy Sine, MD;  Location: [facility] CATH LAB;  Service: Cardiovascular;  Laterality: N/A;  . LITHOTRIPSY  X1  . REVISON OF ARTERIOVENOUS FISTULA Left 16/60/6004   Procedure: PLICATION OF LEFT ARM ARTERIOVENOUS FISTULA;  Surgeon: Angelia Mould, MD;  Location: [place];  Service: Vascular;  Laterality: Left;     Home Medications:  Prior to Admission medications   Medication Sig Start Date End Date Taking? Authorizing Provider  amiodarone (PACERONE) 200 MG tablet Take 200 mg by mouth daily.  01/10/18 04/10/18 Yes [provider]  amLODipine (NORVASC) 10 MG tablet Take 10 mg by mouth at bedtime.  01/07/18  Yes [provider]  aspirin EC 81 MG tablet Take 81 mg by mouth daily.   Yes [provider]  atorvastatin (LIPITOR) 20 MG tablet Take 20 mg by mouth at bedtime. Reported on 03/14/2016   Yes [provider]  carvedilol (COREG) 6.25 MG tablet Take 6.25 mg by mouth 2 (two) times daily with a meal.  01/07/18  Yes [provider]  cinacalcet (SENSIPAR) 90 MG tablet Take 180 mg by mouth every Monday, Wednesday, and Friday with hemodialysis.    Yes [provider]  doxycycline (VIBRAMYCIN) 100 MG capsule Take 100 mg by mouth See admin instructions. 7 day course started 01/20/18 - take one capsule (100 mg) by mouth twice daily  - take 2 hours before or after binders & renal vitamin 01/20/18  Yes [provider]  isosorbide mononitrate (IMDUR) 30 MG 24 hr tablet Take 1 tablet (30 mg total) by mouth daily. Patient taking differently: Take 30 mg by mouth daily as needed (chest pain).  10/29/17 10/29/18 Yes Alphonzo Grieve, MD  nitroGLYCERIN (NITROSTAT) 0.4 MG SL tablet Place 0.4 mg under the tongue every 5 (five) minutes as needed for chest pain.   Yes [provider]  rOPINIRole (REQUIP) 0.25 MG tablet Take 1 tablet (0.25 mg total) by mouth at bedtime. 01/14/18  Yes Velna Ochs, MD  sevelamer carbonate (RENVELA) 800 MG tablet Take 4 tablets (3,200 mg total) by mouth 3 (three) times daily with meals. Patient taking differently: Take 2,400-3,200 mg by mouth See admin instructions. Take 4 tablets (3200 mg) by mouth three times daily with meals and 3 tablets (2400 mg) with  snacks 03/12/16  Yes Vann, Jessica U, DO  albuterol (PROVENTIL HFA;VENTOLIN HFA) 108 (90 Base) MCG/ACT inhaler Inhale 1-2 puffs into the lungs every 6 (six) hours as needed for wheezing or shortness of breath. Patient not taking: Reported on 12/01/2017 01/19/16   Delsa Grana, PA-C  aspirin 81 MG chewable tablet Chew 1 tablet (81 mg total) by mouth daily. Patient not taking: Reported on 01/26/2018 07/22/15   Iline Oven, MD  carvedilol (COREG) 25 MG tablet Take 1 tablet (25 mg total) by mouth See admin instructions. Take 1 tablet (25 mg) by mouth daily at bedtime, take 1 tablet (25 mg) on Sunday, Tuesday, Thursday, Saturday mornings Patient not taking: Reported on 01/26/2018 10/09/17   Molt, Bethany, DO  metoCLOPramide (REGLAN) 5 MG tablet Take 1 tablet (5 mg total) by mouth 3 (three) times daily as needed for nausea. Patient not taking: Reported on 01/26/2018 10/29/17 10/29/18  Alphonzo Grieve, MD  multivitamin (RENA-VIT) TABS tablet Take 1 tablet by mouth at bedtime. Patient not taking: Reported on 01/22/2018 12/15/14   Geradine Girt, DO     Inpatient Medications: Scheduled Meds: . aspirin  81 mg Oral Daily  . atorvastatin  40 mg Oral q1800  . dextrose  1 ampule Intravenous Once  . heparin  5,000 Units Subcutaneous Q8H  . insulin aspart  5 Units Subcutaneous Once  . sevelamer carbonate  3,200 mg Oral TID WC  . sodium polystyrene  30 g Oral Once   Continuous Infusions:  PRN Meds: acetaminophen **OR** acetaminophen, sevelamer carbonate  Allergies:    Allergies  Allergen Reactions  . Enalapril Hives and Rash  . Latex Rash  . Tape Rash    TAPE MAKES SKIN BREAK OUT AND TURN RED    Social History:   Social History   Socioeconomic History  . Marital status: Married    Spouse name: Not on file  . Number of children: Not on file  . Years of education: Not on file  . Highest education level: Not on file  Occupational History  . Not on file  Social Needs  . Financial resource strain: Not on file  . Food insecurity:    Worry: Not on file    Inability: Not on file  . Transportation needs:    Medical: Not on file    Non-medical: Not on file  Tobacco Use  . Smoking status: Former Smoker    Packs/day: 0.50    Years: 8.00    Pack years: 4.00    Types: Cigarettes    Last attempt to quit: 10/16/2017    Years since quitting: 0.2  . Smokeless tobacco: Never Used  . Tobacco comment: 3-4 per day.  wants patch  Substance and Sexual Activity  . Alcohol use: No    Alcohol/week: 0.0 oz  . Drug use: No  . Sexual activity: Not Currently  Lifestyle  . Physical activity:    Days per week: Not on file    Minutes per session: Not on file  . Stress: Not on file  Relationships  . Social connections:    Talks on phone: Not on file    Gets together: Not on file    Attends religious service: Not on file    Active member of club or organization: Not on file    Attends meetings of clubs or organizations: Not on file    Relationship status: Not on file  . Intimate partner violence:    Fear of current or ex partner: Not  on file    Emotionally abused: Not on file    Physically abused: Not on file    Forced sexual activity: Not on file  Other Topics Concern  . Not on file  Social History Narrative   Moved from [place], [place] to [place] in 11/2014.      Lives with fiancee      Quit smoking 02/2017    Family History:    Family History  Problem Relation Age of Onset  . Bone cancer Mother   . Anuerysm Father   . Hypertension Unknown   . Diabetes type II Daughter      ROS:  Please see the history of present illness.   All other ROS reviewed and negative.     Physical Exam/Data:   Vitals:   01/27/18 0457 01/27/18 0500 01/27/18 0501 01/27/18 0835  BP: 136/72  138/76 (!) 152/52  Pulse: 83 84 83 85  Resp: (!) 24 (!) 21 (!) 23 19  Temp: (!) 97.2 F (36.2 C)  (!) 97.2 F (36.2 C) 97.7 F (36.5 C)  TempSrc:    Oral  SpO2: 96% 99% 95% 99%  Weight:      Height:        Intake/Output Summary (Last 24 hours) at 01/27/2018 1210 Last data filed at 01/27/2018 0918 Gross per 24 hour  Intake 580 ml  Output 2500 ml  Net -1920 ml   Filed Weights   01/27/18 0354 01/27/18 0450  Weight: 210 lb 1.6 oz (95.3 kg) 208 lb 1.8 oz (94.4 kg)   Body mass index is 28.23 kg/m.  General:  Well nourished, well developed, in no acute distress HEENT: normal Neck: no JVD Vascular: No carotid bruits; FA pulses 2+ bilaterally without bruits  Cardiac:  normal S1, S2; RR bradycardic rate; + murmur Lungs:  clear to auscultation bilaterally, but diminished in bases Abd: soft, nontender, no hepatomegaly  Ext: no edema Musculoskeletal:  No deformities, BUE and BLE strength normal and equal Skin: warm and dry  Neuro:  CNs 2-12 intact, no focal abnormalities noted Psych:  Normal affect   EKG:  The EKG was personally reviewed and demonstrates:  Sinus with sinus arrhythmia and PVCs,  Telemetry:  Telemetry was personally reviewed and demonstrates:  Sinus bradycardia  Relevant CV Studies:  Heart cath  11/13/17: Findings: 1. Severe mitral regurgitation 2. Antero-lateral wall hypokinesis with mild reduction in LV systolic  function 3. Coronary artery disease including 80% stenosis in the mid-LAD at the  bifurcation of a moderated sized diagonal which has a 90% ostial stenosis  Recommendations:   CT Surgery Consultation for CABG and mitral valve replacement  Laboratory Data:  Chemistry Recent Labs  Lab 01/26/18 1736 01/26/18 2227 01/27/18 0642  NA 141 143 136  K 6.0* 5.5* 4.8  CL 100* 105 99*  CO2 21* 23 25  GLUCOSE 117* 65 82  BUN 70* 70* 30*  CREATININE 11.44* 11.01* 6.37*  CALCIUM 8.5* 7.8* 7.9*  GFRNONAA 4* 4* 9*  GFRAA 5* 5* 10*  ANIONGAP 20* 15 12    Recent Labs  Lab 01/26/18 2227 01/27/18 0642  ALBUMIN 2.6* 2.7*   Hematology Recent Labs  Lab 01/26/18 1736  WBC 6.4  RBC 3.53*  HGB 9.3*  HCT 30.6*  MCV 86.7  MCH 26.3  MCHC 30.4  RDW 19.1*  PLT 220   Cardiac Enzymes Recent Labs  Lab 01/26/18 2227  TROPONINI 0.04*    Recent Labs  Lab 01/26/18 1742  TROPIPOC 0.02  BNPNo results for input(s): BNP, PROBNP in the last 168 hours.  DDimer No results for input(s): DDIMER in the last 168 hours.  Radiology/Studies:  Dg Chest 2 View  Result Date: 01/26/2018 CLINICAL DATA:  Chest pain and shortness of breath EXAM: CHEST - 2 VIEW COMPARISON:  12/09/2017 FINDINGS: Cardiac shadow is enlarged but stable. Postsurgical changes are now seen. The lungs are well aerated bilaterally. Mild vascular congestion is again seen with small left pleural effusion. No focal confluent infiltrate is seen. No bony abnormality is noted. IMPRESSION: Mild vascular congestion small left pleural effusion. Electronically Signed   By: Inez Catalina M.D.   On: 01/26/2018 18:06    Assessment and Plan:   1. Bradycardia - recent CABG and MVR complicated by post-op Afib and HTN. He was discharged on amiodarone and coreg. He has since developed lightheadedness with subsequent falls.  HRR here with sinus to sinus bradycardia, RBBB has resolved. Agree with continuing to hold coreg and amiodarone for now. His heart rate fluctuated during my interview from 80s to 31s, he remained asymptomatic. He also ambulated halls today with assistance and was asymptomatic. I think he is safe to discharge without coreg and amiodarone so he can make his appt with CTS at [facility] tomorrow. I encouraged him not to drive and to have 18/5 help at home.  2. CAD s/p CABG - stable, denies chest pain  3. Moderate to severe mitral regurgitation - s/p MVR, stable  4. ESRD on HD, chronic diastolic heart failure - fluid status per nephrology - recommend HD today so he can discharge and make his appt at [facility] tomorrow    For questions or updates, please contact [place] Please consult [email or web address] for contact info under Cardiology/STEMI.   Signed, Ledora Bottcher, [place]  01/27/2018 12:10 PM  Will need dialysis  Patient seen and examined. Agree with assessment and plan.   [name] [name] is a 62 year old African-American male who has history of end-stage renal disease and has been on dialysis for 10 years. He has  CAD and remotely had undergone PCI to his LAD in 2011 in 2012.  He was evaluated at [facility] in February 2019 and was found to have multivessel CAD in addition to moderate to severe mitral regurgitation.  On December 26, 2017 he underwent CABG revascularization surgery and mitral valve  surgery.  He developed postoperative atrial fibrillation and was ultimately discharged on amiodarone and carvedilol.  He had missed his dialysis last Friday and developed some lightheadedness.  Upon presenting here he  was hyperkalemic as result of his mixed hemodialysis and had volume overload.  The patient was seen earlier today by Fabian Sharp, and was noted to have variable heart rhythm with heart rates ranging from the 40s to 80s.  During my evaluation with the patient, it appeared that he was in and out of atrial  fibrillation He also had an episode of low atrial tachycardia up to 117 and then broke into sinus bradycardia at 48.  He then had frequent PVCs and PACs and appeared to be going in and out of PAF.  He apparently had missed his previous cardiovascular follow-up evaluation and he has a scheduled appointment for tomorrow as an outpatient at [facility].  At present, I would continue amiodarone at 200 mg daily but hold carvedilol.  He has been on subcu heparin, but with PAF may need to consider  anticoagulation as an outpatient with warfarin instead of DOAC in light of his end-stage renal disease.  His lightheadedness has resolved.  He denies any recurrent anginal symptoms.  He tolerated removal of 2.5 L at dialysis overnight. Potassium now is improved at 4.8.  Creatinine 6.37.  Patient will be leaving later today and should not drive for his surgical evaluation tomorrow in [facility].  Troy Sine , MD, [facility] 01/27/2018 4:41 PM

## 2018-01-27 NOTE — Plan of Care (Signed)
Discussed with patient plan of care, pain management, and floor routine with some teach back displayed

## 2018-01-27 NOTE — Progress Notes (Signed)
Impression: 1  Hyperkalemia - due to missed HD Friday--improved 2  Vol overload/ SOB/ pulm edema--improved  3  ESRD on HD MWF, finished early this morning 4  HTN - cont BP meds 5  DM2 - insulin per primary 6  Chest pain - recent CABG, trop neg x 2 and EKG w/o ischemic changes   Plan - Patient needs to be adherent to scheduled HD treatments  Subjective: Interval History: Better.  Objective: Vital signs in last 24 hours: Temp:  [97.2 F (36.2 C)-98.1 F (36.7 C)] 97.9 F (36.6 C) (04/22 1302) Pulse Rate:  [40-87] 42 (04/22 1302) Resp:  [13-26] 26 (04/22 1302) BP: (103-152)/(52-85) 141/69 (04/22 1302) SpO2:  [93 %-100 %] 100 % (04/22 1302) Weight:  [94.4 kg (208 lb 1.8 oz)-95.3 kg (210 lb 1.6 oz)] 94.4 kg (208 lb 1.8 oz) (04/22 0450) Weight change:   Intake/Output from previous day: 04/21 0701 - 04/22 0700 In: 220 [IV Piggyback:220] Out: 2500  Intake/Output this shift: Total I/O In: 360 [P.O.:360] Out: -   General appearance: alert and cooperative Resp: clear to auscultation bilaterally, Cor RR g2/6 SEM GI: soft, non-tender; bowel sounds normal; no masses,  no organomegaly Extremities: extremities normal, atraumatic, no cyanosis or edema  Lab Results: Recent Labs    01/26/18 1736  WBC 6.4  HGB 9.3*  HCT 30.6*  PLT 220   BMET:  Recent Labs    01/26/18 2227 01/27/18 0642  NA 143 136  K 5.5* 4.8  CL 105 99*  CO2 23 25  GLUCOSE 65 82  BUN 70* 30*  CREATININE 11.01* 6.37*  CALCIUM 7.8* 7.9*   No results for input(s): PTH in the last 72 hours. Iron Studies: No results for input(s): IRON, TIBC, TRANSFERRIN, FERRITIN in the last 72 hours. Studies/Results: Dg Chest 2 View  Result Date: 01/26/2018 CLINICAL DATA:  Chest pain and shortness of breath EXAM: CHEST - 2 VIEW COMPARISON:  12/09/2017 FINDINGS: Cardiac shadow is enlarged but stable. Postsurgical changes are now seen. The lungs are well aerated bilaterally. Mild vascular congestion is again seen with  small left pleural effusion. No focal confluent infiltrate is seen. No bony abnormality is noted. IMPRESSION: Mild vascular congestion small left pleural effusion. Electronically Signed   By: Inez Catalina M.D.   On: 01/26/2018 18:06    Scheduled: . aspirin  81 mg Oral Daily  . atorvastatin  40 mg Oral q1800  . dextrose  1 ampule Intravenous Once  . heparin  5,000 Units Subcutaneous Q8H  . insulin aspart  5 Units Subcutaneous Once  . sevelamer carbonate  3,200 mg Oral TID WC  . sodium polystyrene  30 g Oral Once    LOS: 1 day   Estanislado Emms 01/27/2018,3:11 PM

## 2018-01-27 NOTE — Progress Notes (Signed)
   Subjective: Patient received dialysis overnight following admission which he tolerated well, 2.5 L removed.  Reports feeling improved this morning.  Denies dizziness, though he has not ambulated today.  Still bradycardic to 40s during rounds, similar on telemetry reviewed though heart rate varies.  Objective:  Vital signs in last 24 hours: Vitals:   01/27/18 0457 01/27/18 0500 01/27/18 0501 01/27/18 0835  BP: 136/72  138/76 (!) 152/52  Pulse: 83 84 83 85  Resp: (!) 24 (!) 21 (!) 23 19  Temp: (!) 97.2 F (36.2 C)  (!) 97.2 F (36.2 C) 97.7 F (36.5 C)  TempSrc:    Oral  SpO2: 96% 99% 95% 99%  Weight:      Height:       General: Resting in bed comfortably, no acute distress CV: Bradycardia, S1, S2,  Midline incisional site Resp: Improved breath sounds without crackles, normal work of breathing, no distress  Abd: Soft, +BS, nontender Extr: Trace lower extremity edema Neuro: Alert and oriented x3 Skin: Warm, dry     Assessment/Plan:  ESRD on MWF HD, Hyperkalemia Presented with volume overload in the setting of missed HD session, as well as hyperkalemia to 6.0 treated with insulin, albuterol, calcium gluconate before undergoing HD overnight with subsequent improvement. --Appreciate Nephrology assistance  --I/Os, Renal diet, BMP   CAD s/p recent CABG (3/21), Bradycardia Pt noted to have bradycardia on admission, had recently been started on amiodarone (for period of resolved atrial fibrillation in immediate post-op period) and also on coreg. Pt complaining of dizziness prior to hospitalization, though asymptomatic at rest with HR in 40s during interviews. Amiodarone and coreg have been held, K improved with HD though still persistently bradycardic this morning. Will continue to hold rate controlling medications, ambulate pt to assess for sx, and seek cardiology opinion for medication adjustments moving forward.  --Cont to hold amiodarone, coreg --Cont asa, statin     H/o  HTN Normotensive initially, has trended up this morning. Will restart home regimen with improved heart rate to avoid hypotension.   Dispo: Anticipated discharge in approximately 0-2 day(s).   Tawny Asal, MD 01/27/2018, 10:43 AM Pager: [phone]

## 2018-01-27 NOTE — Progress Notes (Signed)
Dialysis treatment completed.  3200 mL ultrafiltrated and net fluid removal 2500 mL.    Patient status unchanged. Lung sounds diminished and clear to ausculation in all fields. Generalized edema. Cardiac: NSR.  Disconnected lines and removed needles.  Pressure held for 10 minutes and band aid/gauze dressing applied.  Report given to bedside RN, Angelique.

## 2018-01-27 NOTE — Progress Notes (Signed)
Patient wanting to leave AMA. IMTS notified. MD coming up to speak to patient.

## 2018-01-27 NOTE — Progress Notes (Signed)
Patient being discharged home. Patient given discharge instructions and verbalized understanding. Patient being discharged home with wife.

## 2018-01-27 NOTE — Progress Notes (Signed)
Patient arrived to unit per ED stretcher.  Reviewed treatment plan and this RN agrees.  Report received from ED RN, Cricket.  Consent obtained.  Patient A & O X 4. Lung sounds diminished to ausculation in all fields. Generalized BLE edema. Cardiac: SB.  Prepped LUAVF with alcohol and cannulated with two 15 gauge needles.  Pulsation of blood noted.  Flushed access well with saline per protocol.  Connected and secured lines and initiated tx at 0024.  UF goal of 4500 mL and net fluid removal of 4000 mL.  Will continue to monitor.

## 2018-01-29 ENCOUNTER — Encounter: Payer: Self-pay | Admitting: Nephrology

## 2018-01-29 DIAGNOSIS — E1129 Type 2 diabetes mellitus with other diabetic kidney complication: Secondary | ICD-10-CM | POA: Diagnosis not present

## 2018-01-29 DIAGNOSIS — N2581 Secondary hyperparathyroidism of renal origin: Secondary | ICD-10-CM | POA: Diagnosis not present

## 2018-01-29 DIAGNOSIS — N186 End stage renal disease: Secondary | ICD-10-CM | POA: Diagnosis not present

## 2018-01-29 DIAGNOSIS — D509 Iron deficiency anemia, unspecified: Secondary | ICD-10-CM | POA: Diagnosis not present

## 2018-01-29 DIAGNOSIS — D631 Anemia in chronic kidney disease: Secondary | ICD-10-CM | POA: Diagnosis not present

## 2018-01-29 NOTE — Discharge Summary (Signed)
Name: [name] [name] MRN: [id number] DOB: Jan 26, 1956 62 y.o. PCP: [name] Seat, MD  Date of Admission: 01/26/2018  4:55 PM Date of Discharge: 01/27/2018 Attending Physician: Dr. Aldine [name]  Discharge Diagnosis: Active Problems:   Hypervolemia associated with renal insufficiency   Hyperkalemia   Discharge Medications: Allergies as of 01/27/2018      Reactions   Enalapril Hives, Rash   Latex Rash   Tape Rash   TAPE MAKES SKIN BREAK OUT AND TURN RED      Medication List    STOP taking these medications   albuterol 108 (90 Base) MCG/ACT inhaler Commonly known as:  PROVENTIL HFA;VENTOLIN HFA   carvedilol 25 MG tablet Commonly known as:  COREG   carvedilol 6.25 MG tablet Commonly known as:  COREG   doxycycline 100 MG capsule Commonly known as:  VIBRAMYCIN   metoCLOPramide 5 MG tablet Commonly known as:  REGLAN     TAKE these medications   amiodarone 200 MG tablet Commonly known as:  PACERONE Take 200 mg by mouth daily.   amLODipine 10 MG tablet Commonly known as:  NORVASC Take 10 mg by mouth at bedtime.   aspirin EC 81 MG tablet Take 81 mg by mouth daily. What changed:  Another medication with the same name was removed. Continue taking this medication, and follow the directions you see here.   atorvastatin 20 MG tablet Commonly known as:  LIPITOR Take 20 mg by mouth at bedtime. Reported on 03/14/2016   cinacalcet 90 MG tablet Commonly known as:  SENSIPAR Take 180 mg by mouth every Monday, Wednesday, and Friday with hemodialysis.   isosorbide mononitrate 30 MG 24 hr tablet Commonly known as:  IMDUR Take 1 tablet (30 mg total) by mouth daily. What changed:    when to take this  reasons to take this   multivitamin Tabs tablet Take 1 tablet by mouth at bedtime.   nitroGLYCERIN 0.4 MG SL tablet Commonly known as:  NITROSTAT Place 0.4 mg under the tongue every 5 (five) minutes as needed for chest pain.   rOPINIRole 0.25 MG tablet Commonly  known as:  REQUIP Take 1 tablet (0.25 mg total) by mouth at bedtime.   sevelamer carbonate 800 MG tablet Commonly known as:  RENVELA Take 4 tablets (3,200 mg total) by mouth 3 (three) times daily with meals. What changed:    how much to take  when to take this  additional instructions       Disposition and follow-up:   [name] was discharged from [facility] in Stable condition.  At the hospital follow up visit please address:  1.  --Keep CABG post-op follow up appointments for CAD management  --Assess HR and rhythm with amiodarone. Coreg discontinued --Assess for recurrent sx of dizziness   2.  Labs / imaging needed at time of follow-up: None  3.  Pending labs/ test needing follow-up: None  Follow-up Appointments:   Hospital Course by problem list:   ESRD on MWF HD, Hyperkalemia He presented with volume overload in the setting of missed hemodialysis session.  He was also found to be hyperkalemic to 6.0 with EKG changes.  This was temporized with insulin/D50, albuterol, calcium gluconate before undergoing hemodialysis with subsequent improvement in both volume status and electrolytes.  Encouraged adherence to outpatient dialysis schedule.   CAD s/p recent CABG, Atrial Fibrillation, Bradycardia Patient underwent CABG (3/21) at [facility] with a period of atrial fibrillation in the postop period for which he was prescribed amiodarone,  also on Coreg.  He was found to be bradycardic on admission with heart rate in the 40s, though denied symptoms at the time.  Amiodarone and Coreg were held and potassium improved with HD, however he was persistently bradycardic the following morning.  With further observation and cardiology assessment, he is noted to have variable heart rate with periods in the 80s as well as periods of atrial fibrillation (no RVR).  It was recommended he be discharged on amiodarone, but holding Coreg to avoid further bradycardia.  He had follow-up  scheduled with cardiothoracic surgery the day following discharge.  Dizziness Prior to admission, patient complained of lightheadedness/dizziness which caused him to miss his dialysis session.  Though bradycardic on admission he denied symptoms during periods of witnessed bradycardia.  He ambulated with nursing staff and did not experience recurrent symptoms prior to discharge.  H/o HTN Initially held regimen with sx of dizziness as well as bradycardia. Amlodipine and Imdur resumed on discharged and Coreg discontinued as above.   Discharge Vitals:   BP (!) 141/69 (BP Location: Right Wrist)   Pulse (!) 42   Temp 97.9 F (36.6 C) (Oral)   Resp (!) 26   Ht 6' (1.829 m)   Wt 208 lb 1.8 oz (94.4 kg)   SpO2 100%   BMI 28.23 kg/m   Pertinent Labs, Studies, and Procedures:  BMP Latest Ref Rng & Units 01/27/2018 01/26/2018 01/26/2018  Glucose 65 - 99 mg/dL 82 65 117(H)  BUN 6 - 20 mg/dL 30(H) 70(H) 70(H)  Creatinine 0.61 - 1.24 mg/dL 6.37(H) 11.01(H) 11.44(H)  Sodium 135 - 145 mmol/L 136 143 141  Potassium 3.5 - 5.1 mmol/L 4.8 5.5(H) 6.0(H)  Chloride 101 - 111 mmol/L 99(L) 105 100(L)  CO2 22 - 32 mmol/L 25 23 21(L)  Calcium 8.9 - 10.3 mg/dL 7.9(L) 7.8(L) 8.5(L)     Discharge Instructions: Discharge Instructions    Diet - low sodium heart healthy   Complete by:  As directed    Discharge instructions   Complete by:  As directed    Good to see [name] [name] -Continue taking the amiodarone 200 mg once a day but stop taking the carvedilol (also called Coreg).  Hopefully this will improve your heart rate in dizziness while still preventing the atrial fibrillation rhythm - Be sure to make it to your follow-up appointment with your heart surgeon tomorrow but we recommend not driving because of your dizziness - You got dialysis early this morning and are good until Wednesday but be sure to attend your normally scheduled session on Wednesday - Your heart doctor may want to discuss a blood thinner  medication because of your a fib but this can be done at your follow up appointments   Increase activity slowly   Complete by:  As directed       Signed: Tawny Asal, MD 01/29/2018, 3:46 PM   Pager: [phone]

## 2018-01-31 DIAGNOSIS — E1129 Type 2 diabetes mellitus with other diabetic kidney complication: Secondary | ICD-10-CM | POA: Diagnosis not present

## 2018-01-31 DIAGNOSIS — D509 Iron deficiency anemia, unspecified: Secondary | ICD-10-CM | POA: Diagnosis not present

## 2018-01-31 DIAGNOSIS — N186 End stage renal disease: Secondary | ICD-10-CM | POA: Diagnosis not present

## 2018-01-31 DIAGNOSIS — N2581 Secondary hyperparathyroidism of renal origin: Secondary | ICD-10-CM | POA: Diagnosis not present

## 2018-01-31 DIAGNOSIS — D631 Anemia in chronic kidney disease: Secondary | ICD-10-CM | POA: Diagnosis not present

## 2018-02-02 DIAGNOSIS — R079 Chest pain, unspecified: Secondary | ICD-10-CM | POA: Diagnosis not present

## 2018-02-02 DIAGNOSIS — R0602 Shortness of breath: Secondary | ICD-10-CM | POA: Diagnosis not present

## 2018-02-02 DIAGNOSIS — R072 Precordial pain: Secondary | ICD-10-CM | POA: Diagnosis not present

## 2018-02-02 DIAGNOSIS — R0600 Dyspnea, unspecified: Secondary | ICD-10-CM | POA: Diagnosis not present

## 2018-02-02 DIAGNOSIS — R0989 Other specified symptoms and signs involving the circulatory and respiratory systems: Secondary | ICD-10-CM | POA: Diagnosis not present

## 2018-02-02 DIAGNOSIS — I509 Heart failure, unspecified: Secondary | ICD-10-CM | POA: Diagnosis not present

## 2018-02-02 DIAGNOSIS — J810 Acute pulmonary edema: Secondary | ICD-10-CM | POA: Diagnosis not present

## 2018-02-02 DIAGNOSIS — R0789 Other chest pain: Secondary | ICD-10-CM | POA: Diagnosis not present

## 2018-02-02 DIAGNOSIS — Z7901 Long term (current) use of anticoagulants: Secondary | ICD-10-CM | POA: Diagnosis not present

## 2018-02-02 DIAGNOSIS — I5021 Acute systolic (congestive) heart failure: Secondary | ICD-10-CM | POA: Diagnosis not present

## 2018-02-02 DIAGNOSIS — I2510 Atherosclerotic heart disease of native coronary artery without angina pectoris: Secondary | ICD-10-CM | POA: Diagnosis not present

## 2018-02-02 DIAGNOSIS — E8770 Fluid overload, unspecified: Secondary | ICD-10-CM | POA: Diagnosis not present

## 2018-02-02 DIAGNOSIS — I132 Hypertensive heart and chronic kidney disease with heart failure and with stage 5 chronic kidney disease, or end stage renal disease: Secondary | ICD-10-CM | POA: Diagnosis not present

## 2018-02-02 DIAGNOSIS — Z992 Dependence on renal dialysis: Secondary | ICD-10-CM | POA: Diagnosis not present

## 2018-02-02 DIAGNOSIS — G2581 Restless legs syndrome: Secondary | ICD-10-CM | POA: Diagnosis not present

## 2018-02-02 DIAGNOSIS — Z79899 Other long term (current) drug therapy: Secondary | ICD-10-CM | POA: Diagnosis not present

## 2018-02-02 DIAGNOSIS — Z951 Presence of aortocoronary bypass graft: Secondary | ICD-10-CM | POA: Insufficient documentation

## 2018-02-02 DIAGNOSIS — N186 End stage renal disease: Secondary | ICD-10-CM | POA: Diagnosis not present

## 2018-02-02 DIAGNOSIS — E875 Hyperkalemia: Secondary | ICD-10-CM | POA: Diagnosis not present

## 2018-02-02 DIAGNOSIS — Z7982 Long term (current) use of aspirin: Secondary | ICD-10-CM | POA: Diagnosis not present

## 2018-02-03 DIAGNOSIS — D631 Anemia in chronic kidney disease: Secondary | ICD-10-CM | POA: Diagnosis not present

## 2018-02-03 DIAGNOSIS — R079 Chest pain, unspecified: Secondary | ICD-10-CM | POA: Diagnosis not present

## 2018-02-03 DIAGNOSIS — N186 End stage renal disease: Secondary | ICD-10-CM | POA: Diagnosis not present

## 2018-02-03 DIAGNOSIS — I10 Essential (primary) hypertension: Secondary | ICD-10-CM | POA: Diagnosis not present

## 2018-02-03 DIAGNOSIS — R0789 Other chest pain: Secondary | ICD-10-CM | POA: Diagnosis not present

## 2018-02-04 DIAGNOSIS — J811 Chronic pulmonary edema: Secondary | ICD-10-CM | POA: Diagnosis not present

## 2018-02-04 DIAGNOSIS — Z79899 Other long term (current) drug therapy: Secondary | ICD-10-CM | POA: Diagnosis not present

## 2018-02-04 DIAGNOSIS — Z48812 Encounter for surgical aftercare following surgery on the circulatory system: Secondary | ICD-10-CM | POA: Diagnosis not present

## 2018-02-04 DIAGNOSIS — Z7982 Long term (current) use of aspirin: Secondary | ICD-10-CM | POA: Diagnosis not present

## 2018-02-04 DIAGNOSIS — Z9889 Other specified postprocedural states: Secondary | ICD-10-CM | POA: Diagnosis not present

## 2018-02-04 DIAGNOSIS — Z951 Presence of aortocoronary bypass graft: Secondary | ICD-10-CM | POA: Diagnosis not present

## 2018-02-04 DIAGNOSIS — J90 Pleural effusion, not elsewhere classified: Secondary | ICD-10-CM | POA: Diagnosis not present

## 2018-02-05 DIAGNOSIS — Z23 Encounter for immunization: Secondary | ICD-10-CM | POA: Diagnosis not present

## 2018-02-05 DIAGNOSIS — Z992 Dependence on renal dialysis: Secondary | ICD-10-CM | POA: Diagnosis not present

## 2018-02-05 DIAGNOSIS — E8770 Fluid overload, unspecified: Secondary | ICD-10-CM | POA: Diagnosis not present

## 2018-02-05 DIAGNOSIS — N2581 Secondary hyperparathyroidism of renal origin: Secondary | ICD-10-CM | POA: Diagnosis not present

## 2018-02-05 DIAGNOSIS — E1122 Type 2 diabetes mellitus with diabetic chronic kidney disease: Secondary | ICD-10-CM | POA: Diagnosis not present

## 2018-02-05 DIAGNOSIS — N186 End stage renal disease: Secondary | ICD-10-CM | POA: Diagnosis not present

## 2018-02-05 DIAGNOSIS — D509 Iron deficiency anemia, unspecified: Secondary | ICD-10-CM | POA: Diagnosis not present

## 2018-02-05 DIAGNOSIS — D631 Anemia in chronic kidney disease: Secondary | ICD-10-CM | POA: Diagnosis not present

## 2018-02-05 DIAGNOSIS — E1129 Type 2 diabetes mellitus with other diabetic kidney complication: Secondary | ICD-10-CM | POA: Diagnosis not present

## 2018-02-05 MED ORDER — AMLODIPINE BESYLATE 5 MG PO TABS
10.00 | ORAL_TABLET | ORAL | Status: DC
Start: ? — End: 2018-02-05

## 2018-02-05 MED ORDER — ASPIRIN EC 81 MG PO TBEC
81.00 | DELAYED_RELEASE_TABLET | ORAL | Status: DC
Start: 2018-02-04 — End: 2018-02-05

## 2018-02-05 MED ORDER — NITROGLYCERIN 0.4 MG SL SUBL
.40 | SUBLINGUAL_TABLET | SUBLINGUAL | Status: DC
Start: ? — End: 2018-02-05

## 2018-02-05 MED ORDER — AMIODARONE HCL 200 MG PO TABS
200.00 | ORAL_TABLET | ORAL | Status: DC
Start: 2018-02-04 — End: 2018-02-05

## 2018-02-05 MED ORDER — ROPINIROLE HCL 0.25 MG PO TABS
0.25 | ORAL_TABLET | ORAL | Status: DC
Start: 2018-02-03 — End: 2018-02-05

## 2018-02-05 MED ORDER — ISOSORBIDE MONONITRATE ER 30 MG PO TB24
30.00 | ORAL_TABLET | ORAL | Status: DC
Start: 2018-02-04 — End: 2018-02-05

## 2018-02-05 MED ORDER — SEVELAMER CARBONATE 800 MG PO TABS
3200.00 | ORAL_TABLET | ORAL | Status: DC
Start: 2018-02-03 — End: 2018-02-05

## 2018-02-05 MED ORDER — CINACALCET HCL 30 MG PO TABS
180.00 | ORAL_TABLET | ORAL | Status: DC
Start: 2018-02-05 — End: 2018-02-05

## 2018-02-05 MED ORDER — HEPARIN SODIUM (PORCINE) 5000 UNIT/ML IJ SOLN
5000.00 | INTRAMUSCULAR | Status: DC
Start: 2018-02-03 — End: 2018-02-05

## 2018-02-07 DIAGNOSIS — D631 Anemia in chronic kidney disease: Secondary | ICD-10-CM | POA: Diagnosis not present

## 2018-02-07 DIAGNOSIS — D509 Iron deficiency anemia, unspecified: Secondary | ICD-10-CM | POA: Diagnosis not present

## 2018-02-07 DIAGNOSIS — E1129 Type 2 diabetes mellitus with other diabetic kidney complication: Secondary | ICD-10-CM | POA: Diagnosis not present

## 2018-02-07 DIAGNOSIS — N2581 Secondary hyperparathyroidism of renal origin: Secondary | ICD-10-CM | POA: Diagnosis not present

## 2018-02-07 DIAGNOSIS — Z23 Encounter for immunization: Secondary | ICD-10-CM | POA: Diagnosis not present

## 2018-02-07 DIAGNOSIS — N186 End stage renal disease: Secondary | ICD-10-CM | POA: Diagnosis not present

## 2018-02-10 DIAGNOSIS — N186 End stage renal disease: Secondary | ICD-10-CM | POA: Diagnosis not present

## 2018-02-10 DIAGNOSIS — N2581 Secondary hyperparathyroidism of renal origin: Secondary | ICD-10-CM | POA: Diagnosis not present

## 2018-02-10 DIAGNOSIS — D509 Iron deficiency anemia, unspecified: Secondary | ICD-10-CM | POA: Diagnosis not present

## 2018-02-10 DIAGNOSIS — E1129 Type 2 diabetes mellitus with other diabetic kidney complication: Secondary | ICD-10-CM | POA: Diagnosis not present

## 2018-02-10 DIAGNOSIS — Z23 Encounter for immunization: Secondary | ICD-10-CM | POA: Diagnosis not present

## 2018-02-10 DIAGNOSIS — D631 Anemia in chronic kidney disease: Secondary | ICD-10-CM | POA: Diagnosis not present

## 2018-02-12 DIAGNOSIS — N2581 Secondary hyperparathyroidism of renal origin: Secondary | ICD-10-CM | POA: Diagnosis not present

## 2018-02-12 DIAGNOSIS — Z23 Encounter for immunization: Secondary | ICD-10-CM | POA: Diagnosis not present

## 2018-02-12 DIAGNOSIS — N186 End stage renal disease: Secondary | ICD-10-CM | POA: Diagnosis not present

## 2018-02-12 DIAGNOSIS — D509 Iron deficiency anemia, unspecified: Secondary | ICD-10-CM | POA: Diagnosis not present

## 2018-02-12 DIAGNOSIS — D631 Anemia in chronic kidney disease: Secondary | ICD-10-CM | POA: Diagnosis not present

## 2018-02-12 DIAGNOSIS — E1129 Type 2 diabetes mellitus with other diabetic kidney complication: Secondary | ICD-10-CM | POA: Diagnosis not present

## 2018-02-14 DIAGNOSIS — E1129 Type 2 diabetes mellitus with other diabetic kidney complication: Secondary | ICD-10-CM | POA: Diagnosis not present

## 2018-02-14 DIAGNOSIS — Z23 Encounter for immunization: Secondary | ICD-10-CM | POA: Diagnosis not present

## 2018-02-14 DIAGNOSIS — D631 Anemia in chronic kidney disease: Secondary | ICD-10-CM | POA: Diagnosis not present

## 2018-02-14 DIAGNOSIS — N186 End stage renal disease: Secondary | ICD-10-CM | POA: Diagnosis not present

## 2018-02-14 DIAGNOSIS — N2581 Secondary hyperparathyroidism of renal origin: Secondary | ICD-10-CM | POA: Diagnosis not present

## 2018-02-14 DIAGNOSIS — D509 Iron deficiency anemia, unspecified: Secondary | ICD-10-CM | POA: Diagnosis not present

## 2018-02-17 ENCOUNTER — Other Ambulatory Visit: Payer: Self-pay

## 2018-02-17 ENCOUNTER — Encounter (HOSPITAL_COMMUNITY): Payer: Self-pay | Admitting: *Deleted

## 2018-02-17 DIAGNOSIS — Z23 Encounter for immunization: Secondary | ICD-10-CM | POA: Diagnosis not present

## 2018-02-17 DIAGNOSIS — E1129 Type 2 diabetes mellitus with other diabetic kidney complication: Secondary | ICD-10-CM | POA: Diagnosis not present

## 2018-02-17 DIAGNOSIS — D509 Iron deficiency anemia, unspecified: Secondary | ICD-10-CM | POA: Diagnosis not present

## 2018-02-17 DIAGNOSIS — N186 End stage renal disease: Secondary | ICD-10-CM | POA: Diagnosis not present

## 2018-02-17 DIAGNOSIS — D631 Anemia in chronic kidney disease: Secondary | ICD-10-CM | POA: Diagnosis not present

## 2018-02-17 DIAGNOSIS — N2581 Secondary hyperparathyroidism of renal origin: Secondary | ICD-10-CM | POA: Diagnosis not present

## 2018-02-17 NOTE — Progress Notes (Signed)
Spoke with pt for pre-op call. Pt has had CABG and Mitral valve replacement on 12/27/17 at [facility]. Pt denies any recent chest pain that he needed to take NTG. Pt is a type 2 diabetic, no longer on medications. States he has no idea when he had an A1C done and he does not check his blood sugar.

## 2018-02-18 ENCOUNTER — Encounter (HOSPITAL_COMMUNITY): Payer: Self-pay | Admitting: *Deleted

## 2018-02-18 ENCOUNTER — Ambulatory Visit (HOSPITAL_COMMUNITY)
Admission: RE | Admit: 2018-02-18 | Discharge: 2018-02-18 | Disposition: A | Discharge status: Home / Self Care | Payer: Medicare Other | Source: Ambulatory Visit | Attending: Vascular Surgery | Admitting: Vascular Surgery

## 2018-02-18 ENCOUNTER — Encounter (HOSPITAL_COMMUNITY)
Admission: RE | Disposition: A | Discharge status: Home / Self Care | Payer: Self-pay | Source: Ambulatory Visit | Attending: Vascular Surgery

## 2018-02-18 ENCOUNTER — Ambulatory Visit (HOSPITAL_COMMUNITY): Payer: Medicare Other | Admitting: Certified Registered Nurse Anesthetist

## 2018-02-18 DIAGNOSIS — F17210 Nicotine dependence, cigarettes, uncomplicated: Secondary | ICD-10-CM | POA: Diagnosis not present

## 2018-02-18 DIAGNOSIS — I509 Heart failure, unspecified: Secondary | ICD-10-CM | POA: Insufficient documentation

## 2018-02-18 DIAGNOSIS — N186 End stage renal disease: Secondary | ICD-10-CM | POA: Diagnosis not present

## 2018-02-18 DIAGNOSIS — I132 Hypertensive heart and chronic kidney disease with heart failure and with stage 5 chronic kidney disease, or end stage renal disease: Secondary | ICD-10-CM | POA: Diagnosis not present

## 2018-02-18 DIAGNOSIS — D631 Anemia in chronic kidney disease: Secondary | ICD-10-CM | POA: Insufficient documentation

## 2018-02-18 DIAGNOSIS — Y832 Surgical operation with anastomosis, bypass or graft as the cause of abnormal reaction of the patient, or of later complication, without mention of misadventure at the time of the procedure: Secondary | ICD-10-CM | POA: Diagnosis not present

## 2018-02-18 DIAGNOSIS — I2510 Atherosclerotic heart disease of native coronary artery without angina pectoris: Secondary | ICD-10-CM | POA: Insufficient documentation

## 2018-02-18 DIAGNOSIS — Z951 Presence of aortocoronary bypass graft: Secondary | ICD-10-CM | POA: Diagnosis not present

## 2018-02-18 DIAGNOSIS — F329 Major depressive disorder, single episode, unspecified: Secondary | ICD-10-CM | POA: Diagnosis not present

## 2018-02-18 DIAGNOSIS — K219 Gastro-esophageal reflux disease without esophagitis: Secondary | ICD-10-CM | POA: Diagnosis not present

## 2018-02-18 DIAGNOSIS — E1122 Type 2 diabetes mellitus with diabetic chronic kidney disease: Secondary | ICD-10-CM | POA: Diagnosis not present

## 2018-02-18 DIAGNOSIS — Z992 Dependence on renal dialysis: Secondary | ICD-10-CM | POA: Diagnosis not present

## 2018-02-18 DIAGNOSIS — T82898A Other specified complication of vascular prosthetic devices, implants and grafts, initial encounter: Secondary | ICD-10-CM | POA: Insufficient documentation

## 2018-02-18 HISTORY — DX: Personal history of urinary calculi: Z87.442

## 2018-02-18 HISTORY — PX: REVISON OF ARTERIOVENOUS FISTULA: SHX6074

## 2018-02-18 HISTORY — DX: Pneumonia, unspecified organism: J18.9

## 2018-02-18 LAB — GLUCOSE, CAPILLARY
GLUCOSE-CAPILLARY: 135 mg/dL — AB (ref 65–99)
Glucose-Capillary: 66 mg/dL (ref 65–99)
Glucose-Capillary: 64 mg/dL — ABNORMAL LOW (ref 65–99)
Glucose-Capillary: 75 mg/dL (ref 65–99)
Glucose-Capillary: 56 mg/dL — ABNORMAL LOW (ref 65–99)
Glucose-Capillary: 67 mg/dL (ref 65–99)

## 2018-02-18 LAB — POCT I-STAT 4, (NA,K, GLUC, HGB,HCT)
GLUCOSE: 68 mg/dL (ref 65–99)
HCT: 36 % — ABNORMAL LOW (ref 39.0–52.0)
HEMOGLOBIN: 12.2 g/dL — AB (ref 13.0–17.0)
POTASSIUM: 5.4 mmol/L — AB (ref 3.5–5.1)
SODIUM: 137 mmol/L (ref 135–145)

## 2018-02-18 SURGERY — REVISON OF ARTERIOVENOUS FISTULA
Anesthesia: General | Site: Arm Upper | Laterality: Left

## 2018-02-18 MED ORDER — PROTAMINE SULFATE 10 MG/ML IV SOLN
INTRAVENOUS | Status: DC | PRN
  Administered 2018-02-18: 25 mg via INTRAVENOUS

## 2018-02-18 MED ORDER — LIDOCAINE-EPINEPHRINE (PF) 1 %-1:200000 IJ SOLN
INTRAMUSCULAR | Status: AC
  Filled 2018-02-18: qty 30

## 2018-02-18 MED ORDER — LIDOCAINE-EPINEPHRINE (PF) 1 %-1:200000 IJ SOLN
INTRAMUSCULAR | Status: DC | PRN
  Administered 2018-02-18: 5 mL

## 2018-02-18 MED ORDER — HYDROMORPHONE HCL 2 MG/ML IJ SOLN: 0.3000 mg | INTRAMUSCULAR | Status: DC | PRN

## 2018-02-18 MED ORDER — DEXAMETHASONE SODIUM PHOSPHATE 10 MG/ML IJ SOLN
INTRAMUSCULAR | Status: DC | PRN
  Administered 2018-02-18: 5 mg via INTRAVENOUS

## 2018-02-18 MED ORDER — SODIUM CHLORIDE 0.9 % IV SOLN
INTRAVENOUS | Status: DC | PRN
  Administered 2018-02-18: 500 mL

## 2018-02-18 MED ORDER — SODIUM CHLORIDE 0.9 % IV SOLN
INTRAVENOUS | Status: DC
  Administered 2018-02-18: 25 mL/h via INTRAVENOUS

## 2018-02-18 MED ORDER — CHLORHEXIDINE GLUCONATE 4 % EX LIQD: 60.0000 mL | Freq: Once | CUTANEOUS | Status: DC

## 2018-02-18 MED ORDER — CEFAZOLIN SODIUM-DEXTROSE 2-4 GM/100ML-% IV SOLN: 2.0000 g | INTRAVENOUS | Status: DC

## 2018-02-18 MED ORDER — HEPARIN SODIUM (PORCINE) 1000 UNIT/ML IJ SOLN
INTRAMUSCULAR | Status: DC | PRN
  Administered 2018-02-18: 6000 [IU] via INTRAVENOUS

## 2018-02-18 MED ORDER — FENTANYL CITRATE (PF) 250 MCG/5ML IJ SOLN
INTRAMUSCULAR | Status: DC | PRN
  Administered 2018-02-18 (×2): 50 ug via INTRAVENOUS

## 2018-02-18 MED ORDER — PHENYLEPHRINE 40 MCG/ML (10ML) SYRINGE FOR IV PUSH (FOR BLOOD PRESSURE SUPPORT)
PREFILLED_SYRINGE | INTRAVENOUS | Status: DC | PRN
  Administered 2018-02-18 (×4): 80 ug via INTRAVENOUS

## 2018-02-18 MED ORDER — FENTANYL CITRATE (PF) 250 MCG/5ML IJ SOLN
INTRAMUSCULAR | Status: AC
  Filled 2018-02-18: qty 5

## 2018-02-18 MED ORDER — CEFAZOLIN SODIUM-DEXTROSE 2-4 GM/100ML-% IV SOLN
INTRAVENOUS | Status: AC
  Filled 2018-02-18: qty 100

## 2018-02-18 MED ORDER — DEXTROSE 50 % IV SOLN
25.0000 mL | Freq: Once | INTRAVENOUS | Status: AC
  Administered 2018-02-18: 25 mL via INTRAVENOUS

## 2018-02-18 MED ORDER — CEFAZOLIN SODIUM-DEXTROSE 2-3 GM-%(50ML) IV SOLR
INTRAVENOUS | Status: DC | PRN
  Administered 2018-02-18: 2 g via INTRAVENOUS

## 2018-02-18 MED ORDER — ONDANSETRON HCL 4 MG/2ML IJ SOLN
INTRAMUSCULAR | Status: DC | PRN
  Administered 2018-02-18: 4 mg via INTRAVENOUS

## 2018-02-18 MED ORDER — STERILE WATER FOR IRRIGATION IR SOLN
Status: DC | PRN
  Administered 2018-02-18: 1000 mL

## 2018-02-18 MED ORDER — 0.9 % SODIUM CHLORIDE (POUR BTL) OPTIME
TOPICAL | Status: DC | PRN
  Administered 2018-02-18: 1000 mL

## 2018-02-18 MED ORDER — DEXTROSE 50 % IV SOLN
INTRAVENOUS | Status: AC
  Administered 2018-02-18: 25 mL via INTRAVENOUS
  Filled 2018-02-18: qty 50

## 2018-02-18 MED ORDER — MIDAZOLAM HCL 2 MG/2ML IJ SOLN
INTRAMUSCULAR | Status: DC | PRN
  Administered 2018-02-18 (×2): 1 mg via INTRAVENOUS

## 2018-02-18 MED ORDER — PROPOFOL 10 MG/ML IV BOLUS
INTRAVENOUS | Status: DC | PRN
  Administered 2018-02-18 (×2): 100 mg via INTRAVENOUS

## 2018-02-18 MED ORDER — SODIUM CHLORIDE 0.9 % IV SOLN
INTRAVENOUS | Status: AC
  Filled 2018-02-18: qty 1.2

## 2018-02-18 MED ORDER — TRAMADOL HCL 50 MG PO TABS: 50.0000 mg | ORAL_TABLET | Freq: Four times a day (QID) | ORAL | 0 refills | Status: AC | PRN

## 2018-02-18 MED ORDER — LIDOCAINE HCL (CARDIAC) PF 100 MG/5ML IV SOSY
PREFILLED_SYRINGE | INTRAVENOUS | Status: DC | PRN
  Administered 2018-02-18: 60 mg via INTRAVENOUS

## 2018-02-18 MED ORDER — DEXTROSE 50 % IV SOLN
25.0000 g | Freq: Once | INTRAVENOUS | Status: AC
Start: 2018-02-18 — End: 2018-02-18
  Administered 2018-02-18: 25 mL via INTRAVENOUS

## 2018-02-18 MED ORDER — MIDAZOLAM HCL 2 MG/2ML IJ SOLN
INTRAMUSCULAR | Status: AC
  Filled 2018-02-18: qty 2

## 2018-02-18 SURGICAL SUPPLY — 34 items
ARMBAND PINK RESTRICT EXTREMIT (MISCELLANEOUS) ×2 IMPLANT
CANISTER SUCT 3000ML PPV (MISCELLANEOUS) ×2 IMPLANT
CLIP VESOCCLUDE MED 6/CT (CLIP) ×2 IMPLANT
CLIP VESOCCLUDE SM WIDE 6/CT (CLIP) ×2 IMPLANT
COVER PROBE W GEL 5X96 (DRAPES) IMPLANT
DERMABOND ADVANCED (GAUZE/BANDAGES/DRESSINGS) ×1
DERMABOND ADVANCED .7 DNX12 (GAUZE/BANDAGES/DRESSINGS) ×1 IMPLANT
ELECT REM PT RETURN 9FT ADLT (ELECTROSURGICAL) ×2
ELECTRODE REM PT RTRN 9FT ADLT (ELECTROSURGICAL) ×1 IMPLANT
GLOVE BIO SURGEON STRL SZ7.5 (GLOVE) IMPLANT
GLOVE BIOGEL PI IND STRL 6.5 (GLOVE) ×1 IMPLANT
GLOVE BIOGEL PI IND STRL 7.0 (GLOVE) ×1 IMPLANT
GLOVE BIOGEL PI INDICATOR 6.5 (GLOVE) ×1
GLOVE BIOGEL PI INDICATOR 7.0 (GLOVE) ×1
GLOVE SURG SS PI 6.5 STRL IVOR (GLOVE) ×2 IMPLANT
GLOVE SURG SS PI 7.5 STRL IVOR (GLOVE) ×4 IMPLANT
GOWN STRL NON-REIN LRG LVL3 (GOWN DISPOSABLE) ×2 IMPLANT
GOWN STRL REUS W/ TWL LRG LVL3 (GOWN DISPOSABLE) ×1 IMPLANT
GOWN STRL REUS W/ TWL XL LVL3 (GOWN DISPOSABLE) ×2 IMPLANT
GOWN STRL REUS W/TWL LRG LVL3 (GOWN DISPOSABLE) ×1
GOWN STRL REUS W/TWL XL LVL3 (GOWN DISPOSABLE) ×2
KIT BASIN OR (CUSTOM PROCEDURE TRAY) ×2 IMPLANT
KIT TURNOVER KIT B (KITS) ×2 IMPLANT
NS IRRIG 1000ML POUR BTL (IV SOLUTION) ×2 IMPLANT
PACK CV ACCESS (CUSTOM PROCEDURE TRAY) ×2 IMPLANT
PAD ARMBOARD 7.5X6 YLW CONV (MISCELLANEOUS) ×4 IMPLANT
SUT MNCRL AB 4-0 PS2 18 (SUTURE) ×2 IMPLANT
SUT PROLENE 5 0 C 1 24 (SUTURE) ×4 IMPLANT
SUT PROLENE 6 0 BV (SUTURE) ×2 IMPLANT
SUT VIC AB 3-0 SH 27 (SUTURE) ×1
SUT VIC AB 3-0 SH 27X BRD (SUTURE) ×1 IMPLANT
TOWEL GREEN STERILE (TOWEL DISPOSABLE) ×2 IMPLANT
UNDERPAD 30X30 (UNDERPADS AND DIAPERS) ×2 IMPLANT
WATER STERILE IRR 1000ML POUR (IV SOLUTION) ×2 IMPLANT

## 2018-02-18 NOTE — Anesthesia Postprocedure Evaluation (Signed)
Anesthesia Post Note  Patient: [name] [name]  Procedure(s) Performed: PLICATE ANEURYSM  OF LEFT ARTERIOVENOUS FISTULA (Left Arm Upper)     Patient location during evaluation: PACU Anesthesia Type: General Level of consciousness: awake and alert Pain management: pain level controlled Vital Signs Assessment: post-procedure vital signs reviewed and stable Respiratory status: spontaneous breathing, nonlabored ventilation and respiratory function stable Cardiovascular status: blood pressure returned to baseline and stable Postop Assessment: no apparent nausea or vomiting Anesthetic complications: no    Last Vitals:  Vitals:   02/18/18 1117 02/18/18 1126  BP: (!) 150/86   Pulse: 81   Resp: 18   Temp:  36.5 C  SpO2: 99%     Last Pain:  Vitals:   02/18/18 1126  TempSrc:   PainSc: 0-No pain                 Ademide Schaberg,W. EDMOND

## 2018-02-18 NOTE — Op Note (Signed)
    Patient name: [name] [name] MRN: [id number] DOB: 1956-01-19 Sex: male  02/18/2018 Pre-operative Diagnosis: esrd, pseudoaneurysm left arm AV fistula Post-operative diagnosis:  Same Surgeon:  Eda Paschal. Donzetta Matters, MD  Assistant: Arlee Muslim, PA Procedure Performed: Plication left arm AV fistula pseudoaneurysm  Indications: 62 year old male with history of end-stage renal disease on dialysis via left arm AV fistula.  Has had plication of the swing segment and is now indicated for plication of more cephalad pseudoaneurysm.  Findings: Pseudoaneurysm was heavily calcified was plicated down to approximately 8 mm diameter lumen.  At completion there was a palpable thrill throughout the fistula.   Procedure:  The patient was identified in the holding area and taken to the operating room was placed supine the operative table and LMA was induced.  Sterilely prepped and draped in the left arm the usual fashion he was given antibiotics and a timeout was called.  We began with a longitudinal incision overlying the proximal aspect of the pseudoaneurysm dissected down to get proximal control but could not adequately visualize the fistula.  We then dissected out the skin overlying the fistula which was very thin.  We were first able to get distal control by placing a vessel loop around the distal aspect of the pseudoaneurysm.  We then dissected back got full control of the pseudoaneurysm and then placed a vessel loop around it proximally.  Patient was given 5000 units of heparin at this time.  We then clamped the fistula was arterial and and exsanguinated applied and clamped the venous end then.  We opened it longitudinally and trimmed it to size to give it a approximately 8 mm to 1 cm lumen.  We did have to remove some calcification irrigated with heparinized saline.  We then closed it with a running 5-0 Prolene suture in a mattress fashion.  Prior to the completion anastomosis we did allow flushing from both directions  and irrigated with heparinized saline.  Upon completion there was a palpable thrill in the runoff vein palpable radial pulse.  There was a dog ear on one end which was talked with suture.  We then irrigated the wound obtained hemostasis gave the patient 25 mg of protamine.  Subcu tissue was closed with 3-0 Vicryl in a running fashion and the skin with a 4-0 Monocryl.  Dermabond was placed to level the skin.  He tolerated procedure well without immediate comp occasion.  All counts were correct at completion.  EBL 25 cc    Leahann Lempke C. Donzetta Matters, MD [facility] Office: [phone] Pager: [phone]

## 2018-02-18 NOTE — Discharge Instructions (Signed)
° °  [facility] ° °Discharge Instructions ° °AV Fistula or Graft Surgery for Dialysis Access ° °Please refer to the following instructions for your post-procedure care. Your surgeon or physician assistant will discuss any changes with you. ° °Activity ° °You may drive the day following your surgery, if you are comfortable and no longer taking prescription pain medication. Resume full activity as the soreness in your incision resolves. ° °Bathing/Showering ° °You may shower after you go home. Keep your incision dry for 48 hours. Do not soak in a bathtub, hot tub, or swim until the incision heals completely. You may not shower if you have a hemodialysis catheter. ° °Incision Care ° °Clean your incision with mild soap and water after 48 hours. Pat the area dry with a clean towel. You do not need a bandage unless otherwise instructed. Do not apply any ointments or creams to your incision. You may have skin glue on your incision. Do not peel it off. It will come off on its own in about one week. Your arm may swell a bit after surgery. To reduce swelling use pillows to elevate your arm so it is above your heart. Your doctor will tell you if you need to lightly wrap your arm with an ACE bandage. ° °Diet ° °Resume your normal diet. There are not special food restrictions following this procedure. In order to heal from your surgery, it is CRITICAL to get adequate nutrition. Your body requires vitamins, minerals, and protein. Vegetables are the best source of vitamins and minerals. Vegetables also provide the perfect balance of protein. Processed food has little nutritional value, so try to avoid this. ° °Medications ° °Resume taking all of your medications. If your incision is causing pain, you may take over-the counter pain relievers such as acetaminophen (Tylenol). If you were prescribed a stronger pain medication, please be aware these medications can cause nausea and constipation. Prevent  nausea by taking the medication with a snack or meal. Avoid constipation by drinking plenty of fluids and eating foods with high amount of fiber, such as fruits, vegetables, and grains. Do not take Tylenol if you are taking prescription pain medications. ° ° ° ° °Follow up °Your surgeon may want to see you in the office following your access surgery. If so, this will be arranged at the time of your surgery. ° °Please call us immediately for any of the following conditions: ° °Increased pain, redness, drainage (pus) from your incision site °Fever of 101 degrees or higher °Severe or worsening pain at your incision site °Hand pain or numbness. ° °Reduce your risk of vascular disease: ° °Stop smoking. If you would like help, call [facility] at 1-800-QUIT-NOW (1-800-784-8669) or [place] at 336-586-4000 ° °Manage your cholesterol °Maintain a desired weight °Control your diabetes °Keep your blood pressure down ° °Dialysis ° °It will take several weeks to several months for your new dialysis access to be ready for use. Your surgeon will determine when it is OK to use it. Your nephrologist will continue to direct your dialysis. You can continue to use your Permcath until your new access is ready for use. ° °If you have any questions, please call the office at [phone]. ° °

## 2018-02-18 NOTE — H&P (Signed)
   History and Physical Update  The patient was interviewed and re-examined.  The patient's previous History and Physical has been reviewed and is unchanged from Dr. Scot Dock recent office visit.  He is now able to dialyze from his previously revised segment as well as more cephalad to this.  We will plan to plicate the remaining aspect of the pseudoaneurysm today in the operating room.  Setareh Rom C. Donzetta Matters, MD [facility] [place] Office: [phone] Pager: [phone]    02/18/2018, 7:49 AM

## 2018-02-18 NOTE — Anesthesia Preprocedure Evaluation (Addendum)
Anesthesia Evaluation  Patient identified by MRN, date of birth, ID band Patient awake    Reviewed: Allergy & Precautions, H&P , NPO status , Patient's Chart, lab work & pertinent test results  Airway Mallampati: II  TM Distance: >3 FB Neck ROM: Full    Dental no notable dental hx. (+) Edentulous Upper, Edentulous Lower, Dental Advisory Given   Pulmonary Current Smoker,    Pulmonary exam normal breath sounds clear to auscultation       Cardiovascular hypertension, Pt. on medications + CAD, + CABG and +CHF   Rhythm:Regular Rate:Normal     Neuro/Psych  Headaches, Depression TIAnegative psych ROS   GI/Hepatic Neg liver ROS, GERD  Medicated and Controlled,  Endo/Other  diabetes  Renal/GU ESRF and DialysisRenal disease  negative genitourinary   Musculoskeletal   Abdominal   Peds  Hematology negative hematology ROS (+) anemia ,   Anesthesia Other Findings   Reproductive/Obstetrics negative OB ROS                             Anesthesia Physical Anesthesia Plan  ASA: III  Anesthesia Plan: General   Post-op Pain Management:    Induction: Intravenous  PONV Risk Score and Plan: 2 and Propofol infusion, Midazolam and Ondansetron  Airway Management Planned: LMA  Additional Equipment:   Intra-op Plan:   Post-operative Plan: Extubation in OR  Informed Consent: I have reviewed the patients History and Physical, chart, labs and discussed the procedure including the risks, benefits and alternatives for the proposed anesthesia with the patient or authorized representative who has indicated his/her understanding and acceptance.   Dental advisory given  Plan Discussed with: CRNA  Anesthesia Plan Comments:        Anesthesia Quick Evaluation

## 2018-02-18 NOTE — Transfer of Care (Signed)
Immediate Anesthesia Transfer of Care Note  Patient: [name] [name]  Procedure(s) Performed: PLICATE ANEURYSM  OF LEFT ARTERIOVENOUS FISTULA (Left Arm Upper)  Patient Location: PACU  Anesthesia Type:General  Level of Consciousness: alert , oriented, drowsy and patient cooperative  Airway & Oxygen Therapy: Patient Spontanous Breathing  Post-op Assessment: Report given to RN, Post -op Vital signs reviewed and stable and Patient moving all extremities X 4  Post vital signs: Reviewed and stable  Last Vitals:  Vitals Value Taken Time  BP 148/90 02/18/2018 10:32 AM  Temp    Pulse 117 02/18/2018 10:32 AM  Resp 14 02/18/2018 10:33 AM  SpO2 76 % 02/18/2018 10:32 AM  Vitals shown include unvalidated device data.  Last Pain:  Vitals:   02/18/18 0637  TempSrc: Oral      Patients Stated Pain Goal: 7 (73/41/93 7902)  Complications: No apparent anesthesia complications

## 2018-02-18 NOTE — Anesthesia Procedure Notes (Signed)
Procedure Name: LMA Insertion Date/Time: 02/18/2018 9:28 AM Performed by: Julieta Bellini, CRNA Pre-anesthesia Checklist: Patient identified, Emergency Drugs available, Suction available and Patient being monitored Patient Re-evaluated:Patient Re-evaluated prior to induction Oxygen Delivery Method: Circle system utilized Preoxygenation: Pre-oxygenation with 100% oxygen Induction Type: IV induction LMA: LMA inserted LMA Size: 5.0 Number of attempts: 1 Placement Confirmation: positive ETCO2 and breath sounds checked- equal and bilateral Tube secured with: Tape Dental Injury: Teeth and Oropharynx as per pre-operative assessment

## 2018-02-19 ENCOUNTER — Encounter (HOSPITAL_COMMUNITY): Payer: Self-pay | Admitting: Vascular Surgery

## 2018-02-19 DIAGNOSIS — E1122 Type 2 diabetes mellitus with diabetic chronic kidney disease: Secondary | ICD-10-CM | POA: Diagnosis not present

## 2018-02-19 DIAGNOSIS — I120 Hypertensive chronic kidney disease with stage 5 chronic kidney disease or end stage renal disease: Secondary | ICD-10-CM | POA: Diagnosis not present

## 2018-02-19 DIAGNOSIS — D631 Anemia in chronic kidney disease: Secondary | ICD-10-CM | POA: Diagnosis not present

## 2018-02-19 DIAGNOSIS — Z992 Dependence on renal dialysis: Secondary | ICD-10-CM | POA: Diagnosis not present

## 2018-02-19 DIAGNOSIS — E1129 Type 2 diabetes mellitus with other diabetic kidney complication: Secondary | ICD-10-CM | POA: Diagnosis not present

## 2018-02-19 DIAGNOSIS — Z23 Encounter for immunization: Secondary | ICD-10-CM | POA: Diagnosis not present

## 2018-02-19 DIAGNOSIS — T82898D Other specified complication of vascular prosthetic devices, implants and grafts, subsequent encounter: Secondary | ICD-10-CM | POA: Diagnosis not present

## 2018-02-19 DIAGNOSIS — I2510 Atherosclerotic heart disease of native coronary artery without angina pectoris: Secondary | ICD-10-CM | POA: Diagnosis not present

## 2018-02-19 DIAGNOSIS — D509 Iron deficiency anemia, unspecified: Secondary | ICD-10-CM | POA: Diagnosis not present

## 2018-02-19 DIAGNOSIS — N2581 Secondary hyperparathyroidism of renal origin: Secondary | ICD-10-CM | POA: Diagnosis not present

## 2018-02-19 DIAGNOSIS — N186 End stage renal disease: Secondary | ICD-10-CM | POA: Diagnosis not present

## 2018-02-20 ENCOUNTER — Other Ambulatory Visit: Payer: Self-pay | Admitting: Internal Medicine

## 2018-02-20 DIAGNOSIS — I120 Hypertensive chronic kidney disease with stage 5 chronic kidney disease or end stage renal disease: Secondary | ICD-10-CM | POA: Diagnosis not present

## 2018-02-20 DIAGNOSIS — E1122 Type 2 diabetes mellitus with diabetic chronic kidney disease: Secondary | ICD-10-CM | POA: Diagnosis not present

## 2018-02-20 DIAGNOSIS — Z992 Dependence on renal dialysis: Secondary | ICD-10-CM | POA: Diagnosis not present

## 2018-02-20 DIAGNOSIS — T82898D Other specified complication of vascular prosthetic devices, implants and grafts, subsequent encounter: Secondary | ICD-10-CM | POA: Diagnosis not present

## 2018-02-20 DIAGNOSIS — G2581 Restless legs syndrome: Secondary | ICD-10-CM

## 2018-02-20 DIAGNOSIS — I2510 Atherosclerotic heart disease of native coronary artery without angina pectoris: Secondary | ICD-10-CM | POA: Diagnosis not present

## 2018-02-20 DIAGNOSIS — N186 End stage renal disease: Secondary | ICD-10-CM | POA: Diagnosis not present

## 2018-02-21 DIAGNOSIS — D509 Iron deficiency anemia, unspecified: Secondary | ICD-10-CM | POA: Diagnosis not present

## 2018-02-21 DIAGNOSIS — N186 End stage renal disease: Secondary | ICD-10-CM | POA: Diagnosis not present

## 2018-02-21 DIAGNOSIS — Z23 Encounter for immunization: Secondary | ICD-10-CM | POA: Diagnosis not present

## 2018-02-21 DIAGNOSIS — D631 Anemia in chronic kidney disease: Secondary | ICD-10-CM | POA: Diagnosis not present

## 2018-02-21 DIAGNOSIS — N2581 Secondary hyperparathyroidism of renal origin: Secondary | ICD-10-CM | POA: Diagnosis not present

## 2018-02-21 DIAGNOSIS — E1129 Type 2 diabetes mellitus with other diabetic kidney complication: Secondary | ICD-10-CM | POA: Diagnosis not present

## 2018-02-22 DIAGNOSIS — T82898D Other specified complication of vascular prosthetic devices, implants and grafts, subsequent encounter: Secondary | ICD-10-CM | POA: Diagnosis not present

## 2018-02-22 DIAGNOSIS — E1122 Type 2 diabetes mellitus with diabetic chronic kidney disease: Secondary | ICD-10-CM | POA: Diagnosis not present

## 2018-02-22 DIAGNOSIS — I2510 Atherosclerotic heart disease of native coronary artery without angina pectoris: Secondary | ICD-10-CM | POA: Diagnosis not present

## 2018-02-22 DIAGNOSIS — Z992 Dependence on renal dialysis: Secondary | ICD-10-CM | POA: Diagnosis not present

## 2018-02-22 DIAGNOSIS — N186 End stage renal disease: Secondary | ICD-10-CM | POA: Diagnosis not present

## 2018-02-22 DIAGNOSIS — I120 Hypertensive chronic kidney disease with stage 5 chronic kidney disease or end stage renal disease: Secondary | ICD-10-CM | POA: Diagnosis not present

## 2018-02-24 DIAGNOSIS — E1129 Type 2 diabetes mellitus with other diabetic kidney complication: Secondary | ICD-10-CM | POA: Diagnosis not present

## 2018-02-24 DIAGNOSIS — N186 End stage renal disease: Secondary | ICD-10-CM | POA: Diagnosis not present

## 2018-02-24 DIAGNOSIS — D509 Iron deficiency anemia, unspecified: Secondary | ICD-10-CM | POA: Diagnosis not present

## 2018-02-24 DIAGNOSIS — N2581 Secondary hyperparathyroidism of renal origin: Secondary | ICD-10-CM | POA: Diagnosis not present

## 2018-02-24 DIAGNOSIS — Z23 Encounter for immunization: Secondary | ICD-10-CM | POA: Diagnosis not present

## 2018-02-24 DIAGNOSIS — D631 Anemia in chronic kidney disease: Secondary | ICD-10-CM | POA: Diagnosis not present

## 2018-02-25 DIAGNOSIS — T82898D Other specified complication of vascular prosthetic devices, implants and grafts, subsequent encounter: Secondary | ICD-10-CM | POA: Diagnosis not present

## 2018-02-25 DIAGNOSIS — E1122 Type 2 diabetes mellitus with diabetic chronic kidney disease: Secondary | ICD-10-CM | POA: Diagnosis not present

## 2018-02-25 DIAGNOSIS — I120 Hypertensive chronic kidney disease with stage 5 chronic kidney disease or end stage renal disease: Secondary | ICD-10-CM | POA: Diagnosis not present

## 2018-02-25 DIAGNOSIS — I2510 Atherosclerotic heart disease of native coronary artery without angina pectoris: Secondary | ICD-10-CM | POA: Diagnosis not present

## 2018-02-25 DIAGNOSIS — Z992 Dependence on renal dialysis: Secondary | ICD-10-CM | POA: Diagnosis not present

## 2018-02-25 DIAGNOSIS — N186 End stage renal disease: Secondary | ICD-10-CM | POA: Diagnosis not present

## 2018-02-25 NOTE — Telephone Encounter (Signed)
Pls sch PCP HTN D.F/U sometime before Aug (4 month F/U)

## 2018-02-26 DIAGNOSIS — E1129 Type 2 diabetes mellitus with other diabetic kidney complication: Secondary | ICD-10-CM | POA: Diagnosis not present

## 2018-02-26 DIAGNOSIS — Z23 Encounter for immunization: Secondary | ICD-10-CM | POA: Diagnosis not present

## 2018-02-26 DIAGNOSIS — N2581 Secondary hyperparathyroidism of renal origin: Secondary | ICD-10-CM | POA: Diagnosis not present

## 2018-02-26 DIAGNOSIS — D509 Iron deficiency anemia, unspecified: Secondary | ICD-10-CM | POA: Diagnosis not present

## 2018-02-26 DIAGNOSIS — N186 End stage renal disease: Secondary | ICD-10-CM | POA: Diagnosis not present

## 2018-02-26 DIAGNOSIS — D631 Anemia in chronic kidney disease: Secondary | ICD-10-CM | POA: Diagnosis not present

## 2018-02-27 DIAGNOSIS — I2510 Atherosclerotic heart disease of native coronary artery without angina pectoris: Secondary | ICD-10-CM | POA: Diagnosis not present

## 2018-02-27 DIAGNOSIS — E1122 Type 2 diabetes mellitus with diabetic chronic kidney disease: Secondary | ICD-10-CM | POA: Diagnosis not present

## 2018-02-27 DIAGNOSIS — T82898D Other specified complication of vascular prosthetic devices, implants and grafts, subsequent encounter: Secondary | ICD-10-CM | POA: Diagnosis not present

## 2018-02-27 DIAGNOSIS — N186 End stage renal disease: Secondary | ICD-10-CM | POA: Diagnosis not present

## 2018-02-27 DIAGNOSIS — Z992 Dependence on renal dialysis: Secondary | ICD-10-CM | POA: Diagnosis not present

## 2018-02-27 DIAGNOSIS — I120 Hypertensive chronic kidney disease with stage 5 chronic kidney disease or end stage renal disease: Secondary | ICD-10-CM | POA: Diagnosis not present

## 2018-02-28 DIAGNOSIS — Z23 Encounter for immunization: Secondary | ICD-10-CM | POA: Diagnosis not present

## 2018-02-28 DIAGNOSIS — N2581 Secondary hyperparathyroidism of renal origin: Secondary | ICD-10-CM | POA: Diagnosis not present

## 2018-02-28 DIAGNOSIS — E1129 Type 2 diabetes mellitus with other diabetic kidney complication: Secondary | ICD-10-CM | POA: Diagnosis not present

## 2018-02-28 DIAGNOSIS — D509 Iron deficiency anemia, unspecified: Secondary | ICD-10-CM | POA: Diagnosis not present

## 2018-02-28 DIAGNOSIS — N186 End stage renal disease: Secondary | ICD-10-CM | POA: Diagnosis not present

## 2018-02-28 DIAGNOSIS — D631 Anemia in chronic kidney disease: Secondary | ICD-10-CM | POA: Diagnosis not present

## 2018-03-04 ENCOUNTER — Encounter (HOSPITAL_COMMUNITY): Payer: Self-pay | Admitting: Emergency Medicine

## 2018-03-04 ENCOUNTER — Ambulatory Visit (HOSPITAL_COMMUNITY)
Admission: EM | Admit: 2018-03-04 | Discharge: 2018-03-04 | Disposition: A | Discharge status: Home / Self Care | Payer: Medicare Other | Attending: Internal Medicine | Admitting: Internal Medicine

## 2018-03-04 ENCOUNTER — Other Ambulatory Visit: Payer: Self-pay

## 2018-03-04 DIAGNOSIS — R112 Nausea with vomiting, unspecified: Secondary | ICD-10-CM

## 2018-03-04 DIAGNOSIS — Z992 Dependence on renal dialysis: Secondary | ICD-10-CM

## 2018-03-04 DIAGNOSIS — I2510 Atherosclerotic heart disease of native coronary artery without angina pectoris: Secondary | ICD-10-CM | POA: Diagnosis not present

## 2018-03-04 DIAGNOSIS — R110 Nausea: Secondary | ICD-10-CM

## 2018-03-04 DIAGNOSIS — E1122 Type 2 diabetes mellitus with diabetic chronic kidney disease: Secondary | ICD-10-CM | POA: Diagnosis not present

## 2018-03-04 DIAGNOSIS — T82898D Other specified complication of vascular prosthetic devices, implants and grafts, subsequent encounter: Secondary | ICD-10-CM | POA: Diagnosis not present

## 2018-03-04 DIAGNOSIS — I10 Essential (primary) hypertension: Secondary | ICD-10-CM

## 2018-03-04 DIAGNOSIS — K529 Noninfective gastroenteritis and colitis, unspecified: Secondary | ICD-10-CM | POA: Diagnosis not present

## 2018-03-04 DIAGNOSIS — N186 End stage renal disease: Secondary | ICD-10-CM | POA: Diagnosis not present

## 2018-03-04 DIAGNOSIS — I120 Hypertensive chronic kidney disease with stage 5 chronic kidney disease or end stage renal disease: Secondary | ICD-10-CM | POA: Diagnosis not present

## 2018-03-04 MED ORDER — ONDANSETRON HCL 4 MG PO TABS: 4.0000 mg | ORAL_TABLET | Freq: Four times a day (QID) | ORAL | 0 refills | Status: DC | PRN

## 2018-03-04 NOTE — ED Triage Notes (Signed)
The patient presented to the [facility] with a complaint of N/V that started yesterday. The patient reported that the nausea has been occurring for months after dialysis. The patient reported that he has not been taken below his dry weight.

## 2018-03-04 NOTE — ED Provider Notes (Signed)
[facility]    CSN: [id number] Arrival date & time: 03/04/18  1257     History   Chief Complaint Chief Complaint  Patient presents with  . Nausea  . Diarrhea    HPI [name] [name] is a 62 y.o. male.   He presents today after having onset of vomiting and diarrhea yesterday.  No blood, no black material in the stool or vomit.  Diarrhea was really loose.  He is sipping liquids today, and having less stools.  He did have emesis earlier this morning.  No sick contacts. He missed his dialysis yesterday, because he was having some any GI issues.  He is hypertensive today. He had a redo coronary bypass 8 weeks ago.  There is a retained suture at a trocar site in his abdomen. He reports persistent nausea after dialysis episodes in the last year or so, very bothersome.    HPI  Past Medical History:  Diagnosis Date  . Anemia   . Anginal pain ([place])   . CAD (coronary artery disease)    a. per [facility] s/p 3.42mm x 46mm Vision BMS to mid LAD 12/2009 and Xience DES to mid LAD 10/2010.  [name] Chronic diastolic CHF (congestive heart failure) ([place])   . Colon polyps   . Daily headache   . ESRD on dialysis [facility]) since ~ 2008   "MWF; Jeneen Rinks" (03/04/2017)  . GERD (gastroesophageal reflux disease)   . Heart murmur   . Hematochezia    a. 2014: colonscopy, which showed moderately-sized internal hemorrhoids, two 68mm polyps in transverse colon and ascending colon that were resected, five 2-28mm polyps in sigmoid colon, descending colon, transverse colon, and ascending colon that were resected. An upper endoscopy was performed and showed normal esophagus, stomach, and duodenum.  . Hematuria    a. H/o hematuria 2014 with cystoscopy that was unrevealing for his source of hematuria. He underwent a kidney ultrasound on 10/14 that showed mildly echogenic and scarred kidneys compatible with medical renal disease, without hydronephrosis or renal calculi.  [name] History of blood transfusion    "had  colonoscopy done; they had to give me some blood"  . History of kidney stones   . Hyperlipidemia   . Hypertension   . On home oxygen therapy    "2L prn" (07/21/2015); "been off it for awhile" (03/04/2017)  . Pneumonia   . Renal insufficiency   . 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 Type II diabetes mellitus ([place])   . Wears dentures     Patient Active Problem List   Diagnosis Date Noted  . Hypervolemia associated with renal insufficiency 01/26/2018  . Hyperkalemia 01/26/2018  . Restless leg syndrome 01/14/2018  . Anemia 01/14/2018  . Tobacco use 10/29/2017  . Mild cognitive impairment 06/13/2017  . Chest pain 06/07/2017  . Skin ulcer of toe of right foot, limited to breakdown of skin ([place])   . Callus of foot 03/07/2017  . Nausea and vomiting 04/02/2016  . ESRD (end stage renal disease) ([place]) 03/18/2016  . Mitral regurgitation   . Diabetic neuropathy ([place]) 07/29/2015  . Depression 07/22/2015  . GERD (gastroesophageal reflux disease) 06/04/2015  . Malnutrition of moderate degree ([place]) 05/17/2015  . Thrombocytopenia ([place]) 05/16/2015  . Weight loss 05/16/2015  . H/O TIA (transient ischemic attack) 04/01/2015  . Diastolic dysfunction-grade 2 12/13/2014  . Hypertension 04/27/2014  . CAD -S/P LAD BMS 2011, LAD DES 2012- patent cors Feb 2016 04/27/2014  .  Diabetes mellitus type 2, diet-controlled ([place]) 04/27/2014    Past Surgical History:  Procedure Laterality Date  . AV FISTULA PLACEMENT Left ~ 2007   "upper arm"  . CARDIAC CATHETERIZATION  "several"  . CORONARY ANGIOPLASTY WITH STENT PLACEMENT  "several"  . CORONARY ARTERY BYPASS GRAFT     3 grafts  . CYSTOSCOPY W/ STONE MANIPULATION  X2?  . EYE SURGERY Bilateral    "laser OR for hemorrhage"  . LEFT HEART CATHETERIZATION WITH CORONARY ANGIOGRAM N/A 11/23/2014   Procedure: LEFT HEART CATHETERIZATION WITH CORONARY ANGIOGRAM;  Surgeon: Troy Sine, MD;  Location: [facility] CATH LAB;  Service:  Cardiovascular;  Laterality: N/A;  . LITHOTRIPSY  X1  . MITRAL VALVE REPLACEMENT    . REVISON OF ARTERIOVENOUS FISTULA Left 06/30/3006   Procedure: PLICATION OF LEFT ARM ARTERIOVENOUS FISTULA;  Surgeon: Angelia Mould, MD;  Location: [place];  Service: Vascular;  Laterality: Left;  . REVISON OF ARTERIOVENOUS FISTULA Left 03/29/6332   Procedure: PLICATE ANEURYSM  OF LEFT ARTERIOVENOUS FISTULA;  Surgeon: Waynetta Sandy, MD;  Location: [place];  Service: Vascular;  Laterality: Left;       Home Medications    Prior to Admission medications   Medication Sig Start Date End Date Taking? Authorizing Provider  amLODipine (NORVASC) 10 MG tablet Take 10 mg by mouth at bedtime.  01/07/18   [provider]  aspirin EC 81 MG tablet Take 81 mg by mouth daily.    [provider]  atorvastatin (LIPITOR) 20 MG tablet Take 20 mg by mouth at bedtime.     [provider]  cinacalcet (SENSIPAR) 90 MG tablet Take 180 mg by mouth every Monday, Wednesday, and Friday with hemodialysis.     [provider]  isosorbide mononitrate (IMDUR) 30 MG 24 hr tablet Take 1 tablet (30 mg total) by mouth daily. 10/29/17 10/29/18  Alphonzo Grieve, MD  metoCLOPramide (REGLAN) 5 MG tablet Take 5 mg by mouth 3 (three) times daily as needed for nausea.    [provider]  multivitamin (RENA-VIT) TABS tablet Take 1 tablet by mouth at bedtime. 12/15/14   Geradine Girt, DO  nitroGLYCERIN (NITROSTAT) 0.4 MG SL tablet Place 0.4 mg under the tongue every 5 (five) minutes as needed for chest pain.    [provider]  ondansetron (ZOFRAN) 4 MG tablet Take 1 tablet (4 mg total) by mouth 4 (four) times daily as needed for nausea or vomiting. 03/04/18   Wynona Luna, MD  oxyCODONE (OXY IR/ROXICODONE) 5 MG immediate release tablet Take 5 mg by mouth every 6 (six) hours as needed for severe pain. 02/04/18   [provider]  promethazine (PHENERGAN) 25 MG tablet TAKE 1  TABLET BY MOUTH EVERY TWELVE HOURS AS NEEDED 02/26/18   [provider]  rOPINIRole (REQUIP) 0.25 MG tablet Take 1 tablet (0.25 mg total) by mouth at bedtime. 02/25/18   Bartholomew Crews, MD  sevelamer carbonate (RENVELA) 800 MG tablet Take 4 tablets (3,200 mg total) by mouth 3 (three) times daily with meals. Patient taking differently: Take 1,600-3,200 mg by mouth See admin instructions. Take 4 tablets (3200 mg) by mouth three times daily with meals and 2 tablets (1600 mg) with snacks 03/12/16   Geradine Girt, DO    Family History Family History  Problem Relation Age of Onset  . Bone cancer Mother   . Anuerysm Father   . Hypertension Unknown   . Diabetes type II Daughter  Social History Social History   Tobacco Use  . Smoking status: Current Some Day Smoker    Packs/day: 0.50    Years: 8.00    Pack years: 4.00    Types: Cigarettes  . Smokeless tobacco: Never Used  . Tobacco comment: 3-4 per day.  wants patch  Substance Use Topics  . Alcohol use: No    Alcohol/week: 0.0 oz  . Drug use: No     Allergies   Enalapril; Latex; and Tape   Review of Systems Review of Systems   Physical Exam Triage Vital Signs ED Triage Vitals  Enc Vitals Group     BP 03/04/18 1315 (!) 182/77     Pulse Rate 03/04/18 1315 94     Resp 03/04/18 1315 18     Temp 03/04/18 1315 98.5 F (36.9 C)     Temp Source 03/04/18 1315 Oral     SpO2 03/04/18 1315 98 %     Weight --      Height --      Pain Score 03/04/18 1314 0     Pain Loc --    No data found.  Updated Vital Signs BP (!) 182/77 (BP Location: Right Arm)   Pulse 94   Temp 98.5 F (36.9 C) (Oral)   Resp 18   SpO2 98%  Physical Exam  Constitutional: He is oriented to person, place, and time. No distress.  Alert, nicely groomed Able to walk into the urgent care independently  HENT:  Head: Atraumatic.  Eyes:  Conjugate gaze, no eye redness/drainage  Neck: Neck supple.  Cardiovascular: Normal rate and regular  rhythm.  Pulmonary/Chest: No respiratory distress. He has no wheezes. He has no rales.  Lungs clear, symmetric breath sounds  Abdominal: He exhibits no distension.  Musculoskeletal: Normal range of motion. He exhibits no edema.  Neurological: He is alert and oriented to person, place, and time.  Skin: Skin is warm and dry.  No cyanosis Pursestring suture removed from upper abdomen without incident  Nursing note and vitals reviewed.    UC Treatments / Results  Labs (all labs ordered are listed, but only abnormal results are displayed) Labs Reviewed - No data to display  EKG None  Radiology No results found.  Procedures Procedures (including critical care time)  Medications Ordered in UC Medications - No data to display  Initial Impression / Assessment and Plan / UC Course  I have reviewed the triage vital signs and the nursing notes.  Pertinent labs & imaging results that were available during my care of the patient were reviewed by me and considered in my medical decision making (see chart for details).  Clinical Course as of Mar 08 2212  Sat Mar 08, 2018  2212 Not orthostatic    [LM]    Clinical Course User Index [LM] Wynona Luna, MD     Final Clinical Impressions(s) / UC Diagnoses   Final diagnoses:  Acute gastroenteritis  Chronic nausea     Discharge Instructions     Anticipate gradual improvement in vomiting and diarrhea over the next 24 hours.  ECG at the urgent care today was nonspecific, did not see a heart attack in progress.  No danger signs on exam today.  No evidence for dehydration; blood pressure was somewhat elevated but no fluid heard in lungs.  Followup for dialysis tomorrow.  Check in with cardiologist as scheduled.  Stitch was removed from the abdomen today without incident.      ED Prescriptions  Medication Sig Dispense Auth. Provider   ondansetron (ZOFRAN) 4 MG tablet Take 1 tablet (4 mg total) by mouth 4 (four) times daily  as needed for nausea or vomiting. 20 tablet Wynona Luna, MD        Wynona Luna, MD 03/08/18 2217

## 2018-03-04 NOTE — Discharge Instructions (Signed)
Anticipate gradual improvement in vomiting and diarrhea over the next 24 hours.  ECG at the urgent care today was nonspecific, did not see a heart attack in progress.  No danger signs on exam today.  No evidence for dehydration; blood pressure was somewhat elevated but no fluid heard in lungs.  Followup for dialysis tomorrow.  Check in with cardiologist as scheduled.  Stitch was removed from the abdomen today without incident.

## 2018-03-05 DIAGNOSIS — D509 Iron deficiency anemia, unspecified: Secondary | ICD-10-CM | POA: Diagnosis not present

## 2018-03-05 DIAGNOSIS — E1129 Type 2 diabetes mellitus with other diabetic kidney complication: Secondary | ICD-10-CM | POA: Diagnosis not present

## 2018-03-05 DIAGNOSIS — D631 Anemia in chronic kidney disease: Secondary | ICD-10-CM | POA: Diagnosis not present

## 2018-03-05 DIAGNOSIS — N2581 Secondary hyperparathyroidism of renal origin: Secondary | ICD-10-CM | POA: Diagnosis not present

## 2018-03-05 DIAGNOSIS — Z23 Encounter for immunization: Secondary | ICD-10-CM | POA: Diagnosis not present

## 2018-03-05 DIAGNOSIS — N186 End stage renal disease: Secondary | ICD-10-CM | POA: Diagnosis not present

## 2018-03-06 DIAGNOSIS — T82898D Other specified complication of vascular prosthetic devices, implants and grafts, subsequent encounter: Secondary | ICD-10-CM | POA: Diagnosis not present

## 2018-03-06 DIAGNOSIS — I120 Hypertensive chronic kidney disease with stage 5 chronic kidney disease or end stage renal disease: Secondary | ICD-10-CM | POA: Diagnosis not present

## 2018-03-06 DIAGNOSIS — I2510 Atherosclerotic heart disease of native coronary artery without angina pectoris: Secondary | ICD-10-CM | POA: Diagnosis not present

## 2018-03-06 DIAGNOSIS — Z992 Dependence on renal dialysis: Secondary | ICD-10-CM | POA: Diagnosis not present

## 2018-03-06 DIAGNOSIS — N186 End stage renal disease: Secondary | ICD-10-CM | POA: Diagnosis not present

## 2018-03-06 DIAGNOSIS — E1122 Type 2 diabetes mellitus with diabetic chronic kidney disease: Secondary | ICD-10-CM | POA: Diagnosis not present

## 2018-03-07 ENCOUNTER — Ambulatory Visit (INDEPENDENT_AMBULATORY_CARE_PROVIDER_SITE_OTHER): Payer: Self-pay | Admitting: Physician Assistant

## 2018-03-07 ENCOUNTER — Other Ambulatory Visit: Payer: Self-pay

## 2018-03-07 ENCOUNTER — Encounter: Payer: Self-pay | Admitting: Physician Assistant

## 2018-03-07 VITALS — BP 164/82 | HR 100 | Temp 98.2°F | Resp 18 | Ht 72.0 in | Wt 203.0 lb

## 2018-03-07 DIAGNOSIS — D631 Anemia in chronic kidney disease: Secondary | ICD-10-CM | POA: Diagnosis not present

## 2018-03-07 DIAGNOSIS — Z23 Encounter for immunization: Secondary | ICD-10-CM | POA: Diagnosis not present

## 2018-03-07 DIAGNOSIS — N186 End stage renal disease: Secondary | ICD-10-CM | POA: Diagnosis not present

## 2018-03-07 DIAGNOSIS — D509 Iron deficiency anemia, unspecified: Secondary | ICD-10-CM | POA: Diagnosis not present

## 2018-03-07 DIAGNOSIS — E1129 Type 2 diabetes mellitus with other diabetic kidney complication: Secondary | ICD-10-CM | POA: Diagnosis not present

## 2018-03-07 DIAGNOSIS — Z992 Dependence on renal dialysis: Secondary | ICD-10-CM | POA: Diagnosis not present

## 2018-03-07 DIAGNOSIS — N2581 Secondary hyperparathyroidism of renal origin: Secondary | ICD-10-CM | POA: Diagnosis not present

## 2018-03-07 NOTE — Progress Notes (Signed)
    Postoperative Access Visit   History of Present Illness   [name] [name] is a 62 y.o. year old male who presents for postoperative follow-up for: left arm brachiocephalic fistula plication by Dr. Donzetta Matters 02/20/18.  This was the second of 2 scheduled plication surgeries.  Today during dialysis, area of recent plication was stuck and patient steates that he was bleeding from AV fistula which was resolved with pressure.  Dr. Augustin Coupe placed R IJ TDC today to rest AV fistula.  Patient has not had any bleeding since this morning.   Physical Examination   Vitals:   03/07/18 1534 03/07/18 1538  BP: (!) 161/85 (!) 164/82  Pulse: 99 100  Resp: 18   Temp: 98.2 F (36.8 C)   TempSrc: Oral   SpO2: 93%   Weight: 203 lb (92.1 kg)   Height: 6' (1.829 m)    Body mass index is 27.53 kg/m.  left arm Incision is healed with some remaining small eschar, palpable hematoma mid upper arm fistula, no active bleeding; palpable L radial;  Strong palpable thrill and audible bruit throughout length of fistula    Medical Decision Making   [name] [name] is a 62 y.o. year old male who presents s/p left arm AV fistula plication; infiltration of fistula during dialysis today; R IJ TDC placed by Dr. Augustin Coupe   Fistula was cannulated in area of recent plication; infiltrated with no further bleeding on exam  Fistula remains patent with palpable thrill and audible bruit despite hematoma at stick site  Recommend resting fistula for 3-4 more weeks to ensure healing of recent plication suture line  HD via TDC while resting fistula  Patient may follow up as needed  Dagoberto Ligas, PA-C [facility] Office: [phone]

## 2018-03-07 NOTE — Progress Notes (Signed)
Vitals:   03/07/18 1534  BP: (!) 161/85  Pulse: 99  Resp: 18  Temp: 98.2 F (36.8 C)  TempSrc: Oral  SpO2: 93%  Weight: 203 lb (92.1 kg)  Height: 6' (1.829 m)

## 2018-03-08 DIAGNOSIS — E1122 Type 2 diabetes mellitus with diabetic chronic kidney disease: Secondary | ICD-10-CM | POA: Diagnosis not present

## 2018-03-08 DIAGNOSIS — N186 End stage renal disease: Secondary | ICD-10-CM | POA: Diagnosis not present

## 2018-03-08 DIAGNOSIS — Z992 Dependence on renal dialysis: Secondary | ICD-10-CM | POA: Diagnosis not present

## 2018-03-10 DIAGNOSIS — Z992 Dependence on renal dialysis: Secondary | ICD-10-CM | POA: Diagnosis not present

## 2018-03-10 DIAGNOSIS — I2510 Atherosclerotic heart disease of native coronary artery without angina pectoris: Secondary | ICD-10-CM | POA: Diagnosis not present

## 2018-03-10 DIAGNOSIS — D631 Anemia in chronic kidney disease: Secondary | ICD-10-CM | POA: Diagnosis not present

## 2018-03-10 DIAGNOSIS — T82898D Other specified complication of vascular prosthetic devices, implants and grafts, subsequent encounter: Secondary | ICD-10-CM | POA: Diagnosis not present

## 2018-03-10 DIAGNOSIS — E1122 Type 2 diabetes mellitus with diabetic chronic kidney disease: Secondary | ICD-10-CM | POA: Diagnosis not present

## 2018-03-10 DIAGNOSIS — E1129 Type 2 diabetes mellitus with other diabetic kidney complication: Secondary | ICD-10-CM | POA: Diagnosis not present

## 2018-03-10 DIAGNOSIS — N186 End stage renal disease: Secondary | ICD-10-CM | POA: Diagnosis not present

## 2018-03-10 DIAGNOSIS — I120 Hypertensive chronic kidney disease with stage 5 chronic kidney disease or end stage renal disease: Secondary | ICD-10-CM | POA: Diagnosis not present

## 2018-03-10 DIAGNOSIS — D509 Iron deficiency anemia, unspecified: Secondary | ICD-10-CM | POA: Diagnosis not present

## 2018-03-10 DIAGNOSIS — N2581 Secondary hyperparathyroidism of renal origin: Secondary | ICD-10-CM | POA: Diagnosis not present

## 2018-03-12 DIAGNOSIS — N186 End stage renal disease: Secondary | ICD-10-CM | POA: Diagnosis not present

## 2018-03-12 DIAGNOSIS — D509 Iron deficiency anemia, unspecified: Secondary | ICD-10-CM | POA: Diagnosis not present

## 2018-03-12 DIAGNOSIS — E1129 Type 2 diabetes mellitus with other diabetic kidney complication: Secondary | ICD-10-CM | POA: Diagnosis not present

## 2018-03-12 DIAGNOSIS — D631 Anemia in chronic kidney disease: Secondary | ICD-10-CM | POA: Diagnosis not present

## 2018-03-12 DIAGNOSIS — N2581 Secondary hyperparathyroidism of renal origin: Secondary | ICD-10-CM | POA: Diagnosis not present

## 2018-03-14 DIAGNOSIS — E1122 Type 2 diabetes mellitus with diabetic chronic kidney disease: Secondary | ICD-10-CM | POA: Diagnosis not present

## 2018-03-14 DIAGNOSIS — E1129 Type 2 diabetes mellitus with other diabetic kidney complication: Secondary | ICD-10-CM | POA: Diagnosis not present

## 2018-03-14 DIAGNOSIS — I2510 Atherosclerotic heart disease of native coronary artery without angina pectoris: Secondary | ICD-10-CM | POA: Diagnosis not present

## 2018-03-14 DIAGNOSIS — D509 Iron deficiency anemia, unspecified: Secondary | ICD-10-CM | POA: Diagnosis not present

## 2018-03-14 DIAGNOSIS — D631 Anemia in chronic kidney disease: Secondary | ICD-10-CM | POA: Diagnosis not present

## 2018-03-14 DIAGNOSIS — I120 Hypertensive chronic kidney disease with stage 5 chronic kidney disease or end stage renal disease: Secondary | ICD-10-CM | POA: Diagnosis not present

## 2018-03-14 DIAGNOSIS — Z992 Dependence on renal dialysis: Secondary | ICD-10-CM | POA: Diagnosis not present

## 2018-03-14 DIAGNOSIS — N186 End stage renal disease: Secondary | ICD-10-CM | POA: Diagnosis not present

## 2018-03-14 DIAGNOSIS — N2581 Secondary hyperparathyroidism of renal origin: Secondary | ICD-10-CM | POA: Diagnosis not present

## 2018-03-14 DIAGNOSIS — T82898D Other specified complication of vascular prosthetic devices, implants and grafts, subsequent encounter: Secondary | ICD-10-CM | POA: Diagnosis not present

## 2018-03-17 DIAGNOSIS — N186 End stage renal disease: Secondary | ICD-10-CM | POA: Diagnosis not present

## 2018-03-17 DIAGNOSIS — N2581 Secondary hyperparathyroidism of renal origin: Secondary | ICD-10-CM | POA: Diagnosis not present

## 2018-03-17 DIAGNOSIS — D631 Anemia in chronic kidney disease: Secondary | ICD-10-CM | POA: Diagnosis not present

## 2018-03-17 DIAGNOSIS — D509 Iron deficiency anemia, unspecified: Secondary | ICD-10-CM | POA: Diagnosis not present

## 2018-03-17 DIAGNOSIS — E1129 Type 2 diabetes mellitus with other diabetic kidney complication: Secondary | ICD-10-CM | POA: Diagnosis not present

## 2018-03-19 DIAGNOSIS — E1129 Type 2 diabetes mellitus with other diabetic kidney complication: Secondary | ICD-10-CM | POA: Diagnosis not present

## 2018-03-19 DIAGNOSIS — D631 Anemia in chronic kidney disease: Secondary | ICD-10-CM | POA: Diagnosis not present

## 2018-03-19 DIAGNOSIS — N186 End stage renal disease: Secondary | ICD-10-CM | POA: Diagnosis not present

## 2018-03-19 DIAGNOSIS — D509 Iron deficiency anemia, unspecified: Secondary | ICD-10-CM | POA: Diagnosis not present

## 2018-03-19 DIAGNOSIS — N2581 Secondary hyperparathyroidism of renal origin: Secondary | ICD-10-CM | POA: Diagnosis not present

## 2018-03-21 DIAGNOSIS — E1129 Type 2 diabetes mellitus with other diabetic kidney complication: Secondary | ICD-10-CM | POA: Diagnosis not present

## 2018-03-21 DIAGNOSIS — N2581 Secondary hyperparathyroidism of renal origin: Secondary | ICD-10-CM | POA: Diagnosis not present

## 2018-03-21 DIAGNOSIS — D509 Iron deficiency anemia, unspecified: Secondary | ICD-10-CM | POA: Diagnosis not present

## 2018-03-21 DIAGNOSIS — D631 Anemia in chronic kidney disease: Secondary | ICD-10-CM | POA: Diagnosis not present

## 2018-03-21 DIAGNOSIS — N186 End stage renal disease: Secondary | ICD-10-CM | POA: Diagnosis not present

## 2018-03-24 DIAGNOSIS — D509 Iron deficiency anemia, unspecified: Secondary | ICD-10-CM | POA: Diagnosis not present

## 2018-03-24 DIAGNOSIS — D631 Anemia in chronic kidney disease: Secondary | ICD-10-CM | POA: Diagnosis not present

## 2018-03-24 DIAGNOSIS — N2581 Secondary hyperparathyroidism of renal origin: Secondary | ICD-10-CM | POA: Diagnosis not present

## 2018-03-24 DIAGNOSIS — E1129 Type 2 diabetes mellitus with other diabetic kidney complication: Secondary | ICD-10-CM | POA: Diagnosis not present

## 2018-03-24 DIAGNOSIS — N186 End stage renal disease: Secondary | ICD-10-CM | POA: Diagnosis not present

## 2018-03-26 DIAGNOSIS — E1129 Type 2 diabetes mellitus with other diabetic kidney complication: Secondary | ICD-10-CM | POA: Diagnosis not present

## 2018-03-26 DIAGNOSIS — N2581 Secondary hyperparathyroidism of renal origin: Secondary | ICD-10-CM | POA: Diagnosis not present

## 2018-03-26 DIAGNOSIS — D509 Iron deficiency anemia, unspecified: Secondary | ICD-10-CM | POA: Diagnosis not present

## 2018-03-26 DIAGNOSIS — D631 Anemia in chronic kidney disease: Secondary | ICD-10-CM | POA: Diagnosis not present

## 2018-03-26 DIAGNOSIS — N186 End stage renal disease: Secondary | ICD-10-CM | POA: Diagnosis not present

## 2018-03-28 DIAGNOSIS — D631 Anemia in chronic kidney disease: Secondary | ICD-10-CM | POA: Diagnosis not present

## 2018-03-28 DIAGNOSIS — E1129 Type 2 diabetes mellitus with other diabetic kidney complication: Secondary | ICD-10-CM | POA: Diagnosis not present

## 2018-03-28 DIAGNOSIS — N2581 Secondary hyperparathyroidism of renal origin: Secondary | ICD-10-CM | POA: Diagnosis not present

## 2018-03-28 DIAGNOSIS — N186 End stage renal disease: Secondary | ICD-10-CM | POA: Diagnosis not present

## 2018-03-28 DIAGNOSIS — D509 Iron deficiency anemia, unspecified: Secondary | ICD-10-CM | POA: Diagnosis not present

## 2018-03-31 DIAGNOSIS — E1129 Type 2 diabetes mellitus with other diabetic kidney complication: Secondary | ICD-10-CM | POA: Diagnosis not present

## 2018-03-31 DIAGNOSIS — D631 Anemia in chronic kidney disease: Secondary | ICD-10-CM | POA: Diagnosis not present

## 2018-03-31 DIAGNOSIS — D509 Iron deficiency anemia, unspecified: Secondary | ICD-10-CM | POA: Diagnosis not present

## 2018-03-31 DIAGNOSIS — N186 End stage renal disease: Secondary | ICD-10-CM | POA: Diagnosis not present

## 2018-03-31 DIAGNOSIS — N2581 Secondary hyperparathyroidism of renal origin: Secondary | ICD-10-CM | POA: Diagnosis not present

## 2018-04-02 DIAGNOSIS — D631 Anemia in chronic kidney disease: Secondary | ICD-10-CM | POA: Diagnosis not present

## 2018-04-02 DIAGNOSIS — N2581 Secondary hyperparathyroidism of renal origin: Secondary | ICD-10-CM | POA: Diagnosis not present

## 2018-04-02 DIAGNOSIS — D509 Iron deficiency anemia, unspecified: Secondary | ICD-10-CM | POA: Diagnosis not present

## 2018-04-02 DIAGNOSIS — N186 End stage renal disease: Secondary | ICD-10-CM | POA: Diagnosis not present

## 2018-04-02 DIAGNOSIS — E1129 Type 2 diabetes mellitus with other diabetic kidney complication: Secondary | ICD-10-CM | POA: Diagnosis not present

## 2018-04-04 DIAGNOSIS — E1129 Type 2 diabetes mellitus with other diabetic kidney complication: Secondary | ICD-10-CM | POA: Diagnosis not present

## 2018-04-04 DIAGNOSIS — D631 Anemia in chronic kidney disease: Secondary | ICD-10-CM | POA: Diagnosis not present

## 2018-04-04 DIAGNOSIS — N186 End stage renal disease: Secondary | ICD-10-CM | POA: Diagnosis not present

## 2018-04-04 DIAGNOSIS — N2581 Secondary hyperparathyroidism of renal origin: Secondary | ICD-10-CM | POA: Diagnosis not present

## 2018-04-04 DIAGNOSIS — D509 Iron deficiency anemia, unspecified: Secondary | ICD-10-CM | POA: Diagnosis not present

## 2018-04-07 DIAGNOSIS — E1129 Type 2 diabetes mellitus with other diabetic kidney complication: Secondary | ICD-10-CM | POA: Diagnosis not present

## 2018-04-07 DIAGNOSIS — Z992 Dependence on renal dialysis: Secondary | ICD-10-CM | POA: Diagnosis not present

## 2018-04-07 DIAGNOSIS — D631 Anemia in chronic kidney disease: Secondary | ICD-10-CM | POA: Diagnosis not present

## 2018-04-07 DIAGNOSIS — N186 End stage renal disease: Secondary | ICD-10-CM | POA: Diagnosis not present

## 2018-04-07 DIAGNOSIS — E1122 Type 2 diabetes mellitus with diabetic chronic kidney disease: Secondary | ICD-10-CM | POA: Diagnosis not present

## 2018-04-07 DIAGNOSIS — D509 Iron deficiency anemia, unspecified: Secondary | ICD-10-CM | POA: Diagnosis not present

## 2018-04-07 DIAGNOSIS — N2581 Secondary hyperparathyroidism of renal origin: Secondary | ICD-10-CM | POA: Diagnosis not present

## 2018-04-09 DIAGNOSIS — D509 Iron deficiency anemia, unspecified: Secondary | ICD-10-CM | POA: Diagnosis not present

## 2018-04-09 DIAGNOSIS — N186 End stage renal disease: Secondary | ICD-10-CM | POA: Diagnosis not present

## 2018-04-09 DIAGNOSIS — D631 Anemia in chronic kidney disease: Secondary | ICD-10-CM | POA: Diagnosis not present

## 2018-04-09 DIAGNOSIS — N2581 Secondary hyperparathyroidism of renal origin: Secondary | ICD-10-CM | POA: Diagnosis not present

## 2018-04-09 DIAGNOSIS — E1129 Type 2 diabetes mellitus with other diabetic kidney complication: Secondary | ICD-10-CM | POA: Diagnosis not present

## 2018-04-14 DIAGNOSIS — N186 End stage renal disease: Secondary | ICD-10-CM | POA: Diagnosis not present

## 2018-04-14 DIAGNOSIS — D631 Anemia in chronic kidney disease: Secondary | ICD-10-CM | POA: Diagnosis not present

## 2018-04-14 DIAGNOSIS — N2581 Secondary hyperparathyroidism of renal origin: Secondary | ICD-10-CM | POA: Diagnosis not present

## 2018-04-14 DIAGNOSIS — E1129 Type 2 diabetes mellitus with other diabetic kidney complication: Secondary | ICD-10-CM | POA: Diagnosis not present

## 2018-04-14 DIAGNOSIS — D509 Iron deficiency anemia, unspecified: Secondary | ICD-10-CM | POA: Diagnosis not present

## 2018-04-16 DIAGNOSIS — N186 End stage renal disease: Secondary | ICD-10-CM | POA: Diagnosis not present

## 2018-04-16 DIAGNOSIS — D631 Anemia in chronic kidney disease: Secondary | ICD-10-CM | POA: Diagnosis not present

## 2018-04-16 DIAGNOSIS — N2581 Secondary hyperparathyroidism of renal origin: Secondary | ICD-10-CM | POA: Diagnosis not present

## 2018-04-16 DIAGNOSIS — E1129 Type 2 diabetes mellitus with other diabetic kidney complication: Secondary | ICD-10-CM | POA: Diagnosis not present

## 2018-04-16 DIAGNOSIS — D509 Iron deficiency anemia, unspecified: Secondary | ICD-10-CM | POA: Diagnosis not present

## 2018-04-17 ENCOUNTER — Other Ambulatory Visit: Payer: Self-pay

## 2018-04-17 MED ORDER — RENA-VITE PO TABS: 1.0000 | ORAL_TABLET | Freq: Every day | ORAL | 0 refills | Status: DC

## 2018-04-17 MED ORDER — AMLODIPINE BESYLATE 10 MG PO TABS: 10.0000 mg | ORAL_TABLET | Freq: Every day | ORAL | 11 refills | Status: AC

## 2018-04-17 NOTE — Telephone Encounter (Signed)
Refills for amlodipine and multivitamin approved

## 2018-04-17 NOTE — Telephone Encounter (Signed)
Pt is calling back regarding bp medicine, pt is having a real bad headache. Pt contact # [phone]

## 2018-04-17 NOTE — Telephone Encounter (Signed)
amLODipine (NORVASC) 10 MG tablet, refill request @ [facility] on [address].

## 2018-04-17 NOTE — Telephone Encounter (Signed)
Next appt scheduled  8/1 with PCP.

## 2018-04-17 NOTE — Telephone Encounter (Signed)
Pharmacy  Needs refill on amlopine and renavit 4mg  @ [facility] [address], pt contact # [phone]

## 2018-04-18 DIAGNOSIS — N2581 Secondary hyperparathyroidism of renal origin: Secondary | ICD-10-CM | POA: Diagnosis not present

## 2018-04-18 DIAGNOSIS — N186 End stage renal disease: Secondary | ICD-10-CM | POA: Diagnosis not present

## 2018-04-18 DIAGNOSIS — D509 Iron deficiency anemia, unspecified: Secondary | ICD-10-CM | POA: Diagnosis not present

## 2018-04-18 DIAGNOSIS — D631 Anemia in chronic kidney disease: Secondary | ICD-10-CM | POA: Diagnosis not present

## 2018-04-18 DIAGNOSIS — E1129 Type 2 diabetes mellitus with other diabetic kidney complication: Secondary | ICD-10-CM | POA: Diagnosis not present

## 2018-04-21 DIAGNOSIS — N186 End stage renal disease: Secondary | ICD-10-CM | POA: Diagnosis not present

## 2018-04-21 DIAGNOSIS — D509 Iron deficiency anemia, unspecified: Secondary | ICD-10-CM | POA: Diagnosis not present

## 2018-04-21 DIAGNOSIS — E1129 Type 2 diabetes mellitus with other diabetic kidney complication: Secondary | ICD-10-CM | POA: Diagnosis not present

## 2018-04-21 DIAGNOSIS — D631 Anemia in chronic kidney disease: Secondary | ICD-10-CM | POA: Diagnosis not present

## 2018-04-21 DIAGNOSIS — N2581 Secondary hyperparathyroidism of renal origin: Secondary | ICD-10-CM | POA: Diagnosis not present

## 2018-04-23 DIAGNOSIS — D631 Anemia in chronic kidney disease: Secondary | ICD-10-CM | POA: Diagnosis not present

## 2018-04-23 DIAGNOSIS — E1129 Type 2 diabetes mellitus with other diabetic kidney complication: Secondary | ICD-10-CM | POA: Diagnosis not present

## 2018-04-23 DIAGNOSIS — D509 Iron deficiency anemia, unspecified: Secondary | ICD-10-CM | POA: Diagnosis not present

## 2018-04-23 DIAGNOSIS — N2581 Secondary hyperparathyroidism of renal origin: Secondary | ICD-10-CM | POA: Diagnosis not present

## 2018-04-23 DIAGNOSIS — N186 End stage renal disease: Secondary | ICD-10-CM | POA: Diagnosis not present

## 2018-04-25 DIAGNOSIS — N186 End stage renal disease: Secondary | ICD-10-CM | POA: Diagnosis not present

## 2018-04-25 DIAGNOSIS — D631 Anemia in chronic kidney disease: Secondary | ICD-10-CM | POA: Diagnosis not present

## 2018-04-25 DIAGNOSIS — D509 Iron deficiency anemia, unspecified: Secondary | ICD-10-CM | POA: Diagnosis not present

## 2018-04-25 DIAGNOSIS — N2581 Secondary hyperparathyroidism of renal origin: Secondary | ICD-10-CM | POA: Diagnosis not present

## 2018-04-25 DIAGNOSIS — E1129 Type 2 diabetes mellitus with other diabetic kidney complication: Secondary | ICD-10-CM | POA: Diagnosis not present

## 2018-04-28 ENCOUNTER — Ambulatory Visit (HOSPITAL_COMMUNITY)
Admission: EM | Admit: 2018-04-28 | Discharge: 2018-04-28 | Disposition: A | Discharge status: Home / Self Care | Payer: Medicare Other | Attending: Family Medicine | Admitting: Family Medicine

## 2018-04-28 ENCOUNTER — Encounter (HOSPITAL_COMMUNITY): Payer: Self-pay | Admitting: Emergency Medicine

## 2018-04-28 DIAGNOSIS — G8929 Other chronic pain: Secondary | ICD-10-CM

## 2018-04-28 DIAGNOSIS — M546 Pain in thoracic spine: Secondary | ICD-10-CM | POA: Diagnosis not present

## 2018-04-28 DIAGNOSIS — D631 Anemia in chronic kidney disease: Secondary | ICD-10-CM | POA: Diagnosis not present

## 2018-04-28 DIAGNOSIS — E1129 Type 2 diabetes mellitus with other diabetic kidney complication: Secondary | ICD-10-CM | POA: Diagnosis not present

## 2018-04-28 DIAGNOSIS — N2581 Secondary hyperparathyroidism of renal origin: Secondary | ICD-10-CM | POA: Diagnosis not present

## 2018-04-28 DIAGNOSIS — N186 End stage renal disease: Secondary | ICD-10-CM | POA: Diagnosis not present

## 2018-04-28 DIAGNOSIS — D509 Iron deficiency anemia, unspecified: Secondary | ICD-10-CM | POA: Diagnosis not present

## 2018-04-28 MED ORDER — METHOCARBAMOL 500 MG PO TABS: 500.0000 mg | ORAL_TABLET | Freq: Two times a day (BID) | ORAL | 0 refills | Status: DC

## 2018-04-28 NOTE — ED Triage Notes (Signed)
PT reports back pain between shoulder blades. PT is a dialysis PT.   Pain has been present for 3 months.

## 2018-04-28 NOTE — Discharge Instructions (Signed)
Be aware muscle relaxer medicines may make you sleepy.

## 2018-04-28 NOTE — ED Provider Notes (Signed)
[place]   [id number] 04/28/18 Arrival Time: 1660  ASSESSMENT & PLAN:  1. Chronic bilateral thoracic back pain    Past 3 months with h/o similar in the past. May end up needing PT. Discussed.  Meds ordered this encounter  Medications  . methocarbamol (ROBAXIN) 500 MG tablet    Sig: Take 1 tablet (500 mg total) by mouth 2 (two) times daily.    Dispense:  20 tablet    Refill:  0   Medication sedation precautions. Follow-up Information    Neva Seat, MD.   Specialty:  Internal Medicine Why:  If symptoms worsen or are not improving over the next week. Contact information: [place] [address] [phone]           Reviewed expectations re: course of current medical issues. Questions answered. Outlined signs and symptoms indicating need for more acute intervention. Patient verbalized understanding. After Visit Summary given.   SUBJECTIVE: History from: patient.  [name] [name] is a 62 y.o. male who presents with complaint of intermittent bilateral mid to upper back discomfort. Onset gradual beginning about 3 months ago with similar one year ago. Injury/trama: no. History of back problems: occasional back pain. Previous back surgery: none reported. Discomfort described as aching without radiation. Certain movements exacerbate the described discomfort. Better with rest. Extremity sensation changes or weakness: none. Ambulatory without difficulty. Normal bowel/bladder habits. No associated abdominal pain/n/v. Self treatment: has not tried OTCs for relief of pain.  Patient reports no fevers, IV drug use, recent back surgeries or procedures, urinary incontinence, or bowel incontinence.  ROS: As per HPI.   OBJECTIVE:  Vitals:   04/28/18 1056 04/28/18 1111  BP: (!) 152/90   Pulse: 94   Resp: 20   Temp: 98 F (36.7 C)   TempSrc: Oral   SpO2: 95%   Weight:  190 lb (86.2 kg)    General appearance: alert; no distress Neck: supple with  FROM; without midline tenderness Lungs: unlabored respirations; symmetrical air entry Abdomen: soft, non-tender Back: bilateral mid and upper tenderness present over thoracic musculature; FROM at hips with mild discomfort reported; bruising: none; without midline tenderness Extremities: no cyanosis or edema; symmetrical with no gross deformities Skin: warm and dry Neurologic: normal gait; normal symmetric reflexes; normal LE strength and sensation Psychological: alert and cooperative; normal mood and affect   Allergies  Allergen Reactions  . Enalapril Hives and Rash  . Latex Rash  . Tape Rash and Other (See Comments)    TAPE MAKES SKIN BREAK OUT AND TURN RED    Past Medical History:  Diagnosis Date  . Anemia   . Anginal pain ([place])   . CAD (coronary artery disease)    a. per [facility] s/p 3.55mm x 61mm Vision BMS to mid LAD 12/2009 and Xience DES to mid LAD 10/2010.  [name] Chronic diastolic CHF (congestive heart failure) ([place])   . Colon polyps   . Daily headache   . ESRD on dialysis [facility]) since ~ 2008   "MWF; Jeneen Rinks" (03/04/2017)  . GERD (gastroesophageal reflux disease)   . Heart murmur   . Hematochezia    a. 2014: colonscopy, which showed moderately-sized internal hemorrhoids, two 43mm polyps in transverse colon and ascending colon that were resected, five 2-48mm polyps in sigmoid colon, descending colon, transverse colon, and ascending colon that were resected. An upper endoscopy was performed and showed normal esophagus, stomach, and duodenum.  . Hematuria    a. H/o hematuria 2014 with cystoscopy that  was unrevealing for his source of hematuria. He underwent a kidney ultrasound on 10/14 that showed mildly echogenic and scarred kidneys compatible with medical renal disease, without hydronephrosis or renal calculi.  [name] History of blood transfusion    "had colonoscopy done; they had to give me some blood"  . History of kidney stones   . Hyperlipidemia   . Hypertension   . On  home oxygen therapy    "2L prn" (07/21/2015); "been off it for awhile" (03/04/2017)  . Pneumonia   . Renal insufficiency   . 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 Type II diabetes mellitus ([place])   . Wears dentures    Social History   Socioeconomic History  . Marital status: Married    Spouse name: Not on file  . Number of children: Not on file  . Years of education: Not on file  . Highest education level: Not on file  Occupational History  . Not on file  Social Needs  . Financial resource strain: Not on file  . Food insecurity:    Worry: Not on file    Inability: Not on file  . Transportation needs:    Medical: Not on file    Non-medical: Not on file  Tobacco Use  . Smoking status: Current Some Day Smoker    Packs/day: 0.50    Years: 8.00    Pack years: 4.00    Types: Cigarettes  . Smokeless tobacco: Never Used  . Tobacco comment: 3-4 per day.  wants patch  Substance and Sexual Activity  . Alcohol use: No    Alcohol/week: 0.0 oz  . Drug use: No  . Sexual activity: Not Currently  Lifestyle  . Physical activity:    Days per week: Not on file    Minutes per session: Not on file  . Stress: Not on file  Relationships  . Social connections:    Talks on phone: Not on file    Gets together: Not on file    Attends religious service: Not on file    Active member of club or organization: Not on file    Attends meetings of clubs or organizations: Not on file    Relationship status: Not on file  . Intimate partner violence:    Fear of current or ex partner: Not on file    Emotionally abused: Not on file    Physically abused: Not on file    Forced sexual activity: Not on file  Other Topics Concern  . Not on file  Social History Narrative   Moved from [place], [place] to [place] in 11/2014.      Lives with fiancee      Quit smoking 02/2017   Family History  Problem Relation Age of Onset  . Bone cancer Mother   . Anuerysm Father   .  Hypertension Unknown   . Diabetes type II Daughter    Past Surgical History:  Procedure Laterality Date  . AV FISTULA PLACEMENT Left ~ 2007   "upper arm"  . CARDIAC CATHETERIZATION  "several"  . CORONARY ANGIOPLASTY WITH STENT PLACEMENT  "several"  . CORONARY ARTERY BYPASS GRAFT     3 grafts  . CYSTOSCOPY W/ STONE MANIPULATION  X2?  . EYE SURGERY Bilateral    "laser OR for hemorrhage"  . LEFT HEART CATHETERIZATION WITH CORONARY ANGIOGRAM N/A 11/23/2014   Procedure: LEFT HEART CATHETERIZATION WITH CORONARY ANGIOGRAM;  Surgeon: Troy Sine, MD;  Location: [facility] CATH LAB;  Service: Cardiovascular;  Laterality: N/A;  . LITHOTRIPSY  X1  . MITRAL VALVE REPLACEMENT    . REVISON OF ARTERIOVENOUS FISTULA Left 15/94/5859   Procedure: PLICATION OF LEFT ARM ARTERIOVENOUS FISTULA;  Surgeon: Angelia Mould, MD;  Location: [facility];  Service: Vascular;  Laterality: Left;  . REVISON OF ARTERIOVENOUS FISTULA Left 2/92/4462   Procedure: PLICATE ANEURYSM  OF LEFT ARTERIOVENOUS FISTULA;  Surgeon: Waynetta Sandy, MD;  Location: [facility];  Service: Vascular;  Laterality: Left;     Vanessa Kick, MD 04/28/18 1129

## 2018-04-29 DIAGNOSIS — Z452 Encounter for adjustment and management of vascular access device: Secondary | ICD-10-CM | POA: Diagnosis not present

## 2018-04-30 DIAGNOSIS — N2581 Secondary hyperparathyroidism of renal origin: Secondary | ICD-10-CM | POA: Diagnosis not present

## 2018-04-30 DIAGNOSIS — E1129 Type 2 diabetes mellitus with other diabetic kidney complication: Secondary | ICD-10-CM | POA: Diagnosis not present

## 2018-04-30 DIAGNOSIS — D631 Anemia in chronic kidney disease: Secondary | ICD-10-CM | POA: Diagnosis not present

## 2018-04-30 DIAGNOSIS — D509 Iron deficiency anemia, unspecified: Secondary | ICD-10-CM | POA: Diagnosis not present

## 2018-04-30 DIAGNOSIS — N186 End stage renal disease: Secondary | ICD-10-CM | POA: Diagnosis not present

## 2018-05-02 DIAGNOSIS — D631 Anemia in chronic kidney disease: Secondary | ICD-10-CM | POA: Diagnosis not present

## 2018-05-02 DIAGNOSIS — E1129 Type 2 diabetes mellitus with other diabetic kidney complication: Secondary | ICD-10-CM | POA: Diagnosis not present

## 2018-05-02 DIAGNOSIS — D509 Iron deficiency anemia, unspecified: Secondary | ICD-10-CM | POA: Diagnosis not present

## 2018-05-02 DIAGNOSIS — N186 End stage renal disease: Secondary | ICD-10-CM | POA: Diagnosis not present

## 2018-05-02 DIAGNOSIS — N2581 Secondary hyperparathyroidism of renal origin: Secondary | ICD-10-CM | POA: Diagnosis not present

## 2018-05-05 DIAGNOSIS — D509 Iron deficiency anemia, unspecified: Secondary | ICD-10-CM | POA: Diagnosis not present

## 2018-05-05 DIAGNOSIS — E1129 Type 2 diabetes mellitus with other diabetic kidney complication: Secondary | ICD-10-CM | POA: Diagnosis not present

## 2018-05-05 DIAGNOSIS — N186 End stage renal disease: Secondary | ICD-10-CM | POA: Diagnosis not present

## 2018-05-05 DIAGNOSIS — D631 Anemia in chronic kidney disease: Secondary | ICD-10-CM | POA: Diagnosis not present

## 2018-05-05 DIAGNOSIS — N2581 Secondary hyperparathyroidism of renal origin: Secondary | ICD-10-CM | POA: Diagnosis not present

## 2018-05-06 ENCOUNTER — Ambulatory Visit (INDEPENDENT_AMBULATORY_CARE_PROVIDER_SITE_OTHER): Payer: Medicare Other | Admitting: Sports Medicine

## 2018-05-06 ENCOUNTER — Encounter: Payer: Self-pay | Admitting: Sports Medicine

## 2018-05-06 ENCOUNTER — Ambulatory Visit (INDEPENDENT_AMBULATORY_CARE_PROVIDER_SITE_OTHER): Payer: Medicare Other

## 2018-05-06 ENCOUNTER — Telehealth: Payer: Self-pay | Admitting: *Deleted

## 2018-05-06 ENCOUNTER — Other Ambulatory Visit: Payer: Self-pay | Admitting: Sports Medicine

## 2018-05-06 DIAGNOSIS — M79674 Pain in right toe(s): Secondary | ICD-10-CM | POA: Diagnosis not present

## 2018-05-06 DIAGNOSIS — I739 Peripheral vascular disease, unspecified: Secondary | ICD-10-CM

## 2018-05-06 DIAGNOSIS — N186 End stage renal disease: Secondary | ICD-10-CM

## 2018-05-06 DIAGNOSIS — E1142 Type 2 diabetes mellitus with diabetic polyneuropathy: Secondary | ICD-10-CM

## 2018-05-06 DIAGNOSIS — Z992 Dependence on renal dialysis: Secondary | ICD-10-CM

## 2018-05-06 DIAGNOSIS — L97511 Non-pressure chronic ulcer of other part of right foot limited to breakdown of skin: Secondary | ICD-10-CM

## 2018-05-06 DIAGNOSIS — M79675 Pain in left toe(s): Secondary | ICD-10-CM

## 2018-05-06 DIAGNOSIS — B351 Tinea unguium: Secondary | ICD-10-CM | POA: Diagnosis not present

## 2018-05-06 MED ORDER — TRAMADOL HCL 50 MG PO TABS: 50.0000 mg | ORAL_TABLET | Freq: Four times a day (QID) | ORAL | 0 refills | Status: AC | PRN

## 2018-05-06 MED ORDER — CADEXOMER IODINE 0.9 % EX GEL: 1.0000 "application " | Freq: Every day | CUTANEOUS | 0 refills | Status: DC | PRN

## 2018-05-06 NOTE — Telephone Encounter (Signed)
-----   Message from [place], [place] sent at 05/06/2018  2:05 PM EDT ----- Regarding: ABIs Recurrent R great toe ulcer DM ESRD PAD ABIs were attempted twice in office but patient kept moving and could not be completed. Please refer patient to have ABIs done Thanks Dr. Cannon Kettle

## 2018-05-06 NOTE — Telephone Encounter (Signed)
Unable to leave a message on pt's home phone, message states phone has a message restriction in place.

## 2018-05-06 NOTE — Progress Notes (Signed)
Subjective: [name] [name] is a 62 y.o. male patient seen in office for evaluation of ulceration of the right great toe. Patient has a history of diabetes and a blood glucose level today that has been "good", 117. Patient states that he has been putting neosporin to area, reports still bleeding and has not healed. Denies nausea/fever/vomiting/chills/night sweats/shortness of breath/pain.   Patient Active Problem List   Diagnosis Date Noted  . Hypervolemia associated with renal insufficiency 01/26/2018  . Hyperkalemia 01/26/2018  . Restless leg syndrome 01/14/2018  . Anemia 01/14/2018  . Tobacco use 10/29/2017  . Mild cognitive impairment 06/13/2017  . Chest pain 06/07/2017  . Skin ulcer of toe of right foot, limited to breakdown of skin ([place])   . Callus of foot 03/07/2017  . Nausea and vomiting 04/02/2016  . ESRD (end stage renal disease) ([place]) 03/18/2016  . Mitral regurgitation   . Diabetic neuropathy ([place]) 07/29/2015  . Depression 07/22/2015  . GERD (gastroesophageal reflux disease) 06/04/2015  . Malnutrition of moderate degree ([place]) 05/17/2015  . Thrombocytopenia ([place]) 05/16/2015  . Weight loss 05/16/2015  . H/O TIA (transient ischemic attack) 04/01/2015  . Diastolic dysfunction-grade 2 12/13/2014  . Hypertension 04/27/2014  . CAD -S/P LAD BMS 2011, LAD DES 2012- patent cors Feb 2016 04/27/2014  . Diabetes mellitus type 2, diet-controlled ([place]) 04/27/2014   Current Outpatient Medications on File Prior to Visit  Medication Sig Dispense Refill  . amLODipine (NORVASC) 10 MG tablet Take 1 tablet (10 mg total) by mouth at bedtime. 30 tablet 11  . aspirin EC 81 MG tablet Take 81 mg by mouth daily.    [name] atorvastatin (LIPITOR) 20 MG tablet Take 20 mg by mouth at bedtime.     . cinacalcet (SENSIPAR) 90 MG tablet Take 180 mg by mouth every Monday, Wednesday, and Friday with hemodialysis.     [name] isosorbide mononitrate (IMDUR) 30 MG 24 hr tablet Take 1 tablet (30 mg total) by mouth daily.  30 tablet 11  . methocarbamol (ROBAXIN) 500 MG tablet Take 1 tablet (500 mg total) by mouth 2 (two) times daily. 20 tablet 0  . metoCLOPramide (REGLAN) 5 MG tablet Take 5 mg by mouth 3 (three) times daily as needed for nausea.    . multivitamin (RENA-VIT) TABS tablet Take 1 tablet by mouth at bedtime. 30 tablet 0  . nitroGLYCERIN (NITROSTAT) 0.4 MG SL tablet Place 0.4 mg under the tongue every 5 (five) minutes as needed for chest pain.    [name] ondansetron (ZOFRAN) 4 MG tablet Take 1 tablet (4 mg total) by mouth 4 (four) times daily as needed for nausea or vomiting. 20 tablet 0  . oxyCODONE (OXY IR/ROXICODONE) 5 MG immediate release tablet Take 5 mg by mouth every 6 (six) hours as needed for severe pain.  0  . promethazine (PHENERGAN) 25 MG tablet TAKE 1 TABLET BY MOUTH EVERY TWELVE HOURS AS NEEDED  3  . rOPINIRole (REQUIP) 0.25 MG tablet Take 1 tablet (0.25 mg total) by mouth at bedtime. 30 tablet 5  . sevelamer carbonate (RENVELA) 800 MG tablet Take 4 tablets (3,200 mg total) by mouth 3 (three) times daily with meals. (Patient taking differently: Take 1,600-3,200 mg by mouth See admin instructions. Take 4 tablets (3200 mg) by mouth three times daily with meals and 2 tablets (1600 mg) with snacks) 320 tablet 0   No current facility-administered medications on file prior to visit.    Allergies  Allergen Reactions  . Enalapril Hives and Rash  . Latex  Rash  . Tape Rash and Other (See Comments)    TAPE MAKES SKIN BREAK OUT AND TURN RED    Recent Results (from the past 2160 hour(s))  Glucose, capillary     Status: None   Collection Time: 02/18/18  6:33 AM  Result Value Ref Range   Glucose-Capillary 66 65 - 99 mg/dL   Comment 1 Notify RN   I-STAT 4, (NA,K, GLUC, HGB,HCT)     Status: Abnormal   Collection Time: 02/18/18  6:56 AM  Result Value Ref Range   Sodium 137 135 - 145 mmol/L   Potassium 5.4 (H) 3.5 - 5.1 mmol/L   Glucose, Bld 68 65 - 99 mg/dL   HCT 36.0 (L) 39.0 - 52.0 %   Hemoglobin  12.2 (L) 13.0 - 17.0 g/dL  Glucose, capillary     Status: Abnormal   Collection Time: 02/18/18  7:30 AM  Result Value Ref Range   Glucose-Capillary 64 (L) 65 - 99 mg/dL  Glucose, capillary     Status: None   Collection Time: 02/18/18  8:26 AM  Result Value Ref Range   Glucose-Capillary 75 65 - 99 mg/dL  Glucose, capillary     Status: None   Collection Time: 02/18/18 10:33 AM  Result Value Ref Range   Glucose-Capillary 67 65 - 99 mg/dL  Glucose, capillary     Status: Abnormal   Collection Time: 02/18/18 10:57 AM  Result Value Ref Range   Glucose-Capillary 56 (L) 65 - 99 mg/dL  Glucose, capillary     Status: Abnormal   Collection Time: 02/18/18 11:20 AM  Result Value Ref Range   Glucose-Capillary 135 (H) 65 - 99 mg/dL    Objective: There were no vitals filed for this visit.  General: Patient is awake, alert, oriented x 3 and in no acute distress.  Dermatology: Skin is warm and dry bilateral with a partial thickness ulceration present right plantar medial hallux with medial fissure. Ulceration measure 1 cm x 0.8 cm x 0.1 cm. There is a keratotic border with a granular base. The ulceration does not probe to bone. There is no malodor, no active drainage, no erythema, no edema. No acute signs of infection.  Nails x 10 elongated consistent with onychomycosis    Vascular: Dorsalis Pedis pulse = 2/4 Bilateral,  Posterior Tibial pulse = 1/4 Bilateral,  Capillary Fill Time < 5 seconds  Neurologic: Protective sensation diminished bilateral.   Musculosketal: No Pain with palpation to ulcerated area. No pain with compression to calves bilateral. No gross bony deformities noted bilateral.  Xrays- Ulceration does not appear to go to bone, there is hallux extensus and calcification of vessels.  Assessment and Plan:  Problem List Items Addressed This Visit      Endocrine   Diabetic neuropathy ([place])   Relevant Medications   traMADol (ULTRAM) 50 MG tablet   Other Relevant Orders   DG Foot  Complete Right    Other Visit Diagnoses    Skin ulcer of right great toe, limited to breakdown of skin (HCC)    -  Primary   Relevant Medications   traMADol (ULTRAM) 50 MG tablet   Great toe pain, right       Relevant Medications   traMADol (ULTRAM) 50 MG tablet   Other Relevant Orders   DG Foot Complete Right   ESRD (end stage renal disease) on dialysis (HCC)       Pain due to onychomycosis of toenails of both feet  PAD (peripheral artery disease) ([place])          -Examined patient and discussed the progression of the wound and treatment alternatives. -Mechanically debrided all nails at patient request using sterile nail nipper without incident -Xrays reviewed with no acute findings to toe as compared to previous xray - Excisionally dedbrided ulcerations to healthy bleeding borders using a sterile chisel blade and wound culture obtained will call if need antibioitcs  -Applied Iodosorb and dry sterile dressing and instructed patient to continue with daily dressings at home consisting of the same;Advise patient to if expensive to use betadine -Rx tramadol for pain -ABIs in office were not be able to be obtained due to patient motion; Rx ABIs - Advised patient to go to the ER or return to office if the wound worsens or if constitutional symptoms are present. -Patient to return to office in 2 weeks for follow up care and evaluation or sooner if problems arise.  Landis Martins, DPM

## 2018-05-06 NOTE — Telephone Encounter (Signed)
Faxed orders to [facility]. 

## 2018-05-06 NOTE — Telephone Encounter (Signed)
Left message informing pt and Oleta Mouse, that Dr. Cannon Kettle was ordering circulation test and they would be ordered locally at [facility] [phone], and to call me with questions.

## 2018-05-07 ENCOUNTER — Telehealth: Payer: Self-pay | Admitting: *Deleted

## 2018-05-07 ENCOUNTER — Encounter (HOSPITAL_COMMUNITY): Payer: Self-pay | Admitting: Sports Medicine

## 2018-05-07 DIAGNOSIS — E1129 Type 2 diabetes mellitus with other diabetic kidney complication: Secondary | ICD-10-CM | POA: Diagnosis not present

## 2018-05-07 DIAGNOSIS — D509 Iron deficiency anemia, unspecified: Secondary | ICD-10-CM | POA: Diagnosis not present

## 2018-05-07 DIAGNOSIS — N2581 Secondary hyperparathyroidism of renal origin: Secondary | ICD-10-CM | POA: Diagnosis not present

## 2018-05-07 DIAGNOSIS — N186 End stage renal disease: Secondary | ICD-10-CM | POA: Diagnosis not present

## 2018-05-07 DIAGNOSIS — D631 Anemia in chronic kidney disease: Secondary | ICD-10-CM | POA: Diagnosis not present

## 2018-05-07 NOTE — Telephone Encounter (Signed)
Pt called for the phone number to [facility] again. I gave him [phone].

## 2018-05-07 NOTE — Telephone Encounter (Signed)
Pt called to give his new phone number [phone], and get the information concerning testing. I informed pt Dr. Cannon Kettle had ordered circulation test at [place] and that he could call and schedule with the doppler lab.

## 2018-05-07 NOTE — Telephone Encounter (Signed)
-----   Message from Baxter Flattery sent at 05/07/2018 10:06 AM EDT ----- Regarding: STAT DOPPLER REQUEST I have tried to reach this patient to schedule Stat Doppler's , but can't get thru to  reach him. His phone has calling restrictions.

## 2018-05-07 NOTE — Telephone Encounter (Signed)
Unable to leave message on pt's phone, the message states pt has restrictions on his phone.

## 2018-05-07 NOTE — Telephone Encounter (Signed)
I informed Falecha - [facility] of pt's new phone number and she states she will give him a call.

## 2018-05-07 NOTE — Telephone Encounter (Signed)
I spoke to Ms [name] [name] and asked if she would have pt or his wife call me concerning his scheduling. Ms [name] [name] states she will have him call.

## 2018-05-07 NOTE — Telephone Encounter (Signed)
Pt's wife, Oleta Mouse called for the appt with [facility]. I informed Viola of the circulation test at [facility] 9:00am on 05/15/2018.

## 2018-05-07 NOTE — Telephone Encounter (Signed)
Left message on Viola's phone for pt or wife to call for information concerning circulation test.

## 2018-05-08 ENCOUNTER — Encounter: Payer: Medicare Other | Admitting: Internal Medicine

## 2018-05-08 DIAGNOSIS — E1122 Type 2 diabetes mellitus with diabetic chronic kidney disease: Secondary | ICD-10-CM | POA: Diagnosis not present

## 2018-05-08 DIAGNOSIS — Z992 Dependence on renal dialysis: Secondary | ICD-10-CM | POA: Diagnosis not present

## 2018-05-08 DIAGNOSIS — N186 End stage renal disease: Secondary | ICD-10-CM | POA: Diagnosis not present

## 2018-05-09 DIAGNOSIS — N186 End stage renal disease: Secondary | ICD-10-CM | POA: Diagnosis not present

## 2018-05-09 DIAGNOSIS — N2581 Secondary hyperparathyroidism of renal origin: Secondary | ICD-10-CM | POA: Diagnosis not present

## 2018-05-09 DIAGNOSIS — E1129 Type 2 diabetes mellitus with other diabetic kidney complication: Secondary | ICD-10-CM | POA: Diagnosis not present

## 2018-05-09 DIAGNOSIS — D631 Anemia in chronic kidney disease: Secondary | ICD-10-CM | POA: Diagnosis not present

## 2018-05-09 LAB — WOUND CULTURE
MICRO NUMBER:: 90899433
SPECIMEN QUALITY: ADEQUATE

## 2018-05-12 DIAGNOSIS — E1129 Type 2 diabetes mellitus with other diabetic kidney complication: Secondary | ICD-10-CM | POA: Diagnosis not present

## 2018-05-12 DIAGNOSIS — N2581 Secondary hyperparathyroidism of renal origin: Secondary | ICD-10-CM | POA: Diagnosis not present

## 2018-05-12 DIAGNOSIS — D631 Anemia in chronic kidney disease: Secondary | ICD-10-CM | POA: Diagnosis not present

## 2018-05-12 DIAGNOSIS — N186 End stage renal disease: Secondary | ICD-10-CM | POA: Diagnosis not present

## 2018-05-12 MED ORDER — SULFAMETHOXAZOLE-TRIMETHOPRIM 800-160 MG PO TABS: 1.0000 | ORAL_TABLET | Freq: Two times a day (BID) | ORAL | 0 refills | Status: DC

## 2018-05-12 NOTE — Telephone Encounter (Signed)
Left message informing pt and wife, Oleta Mouse I was leaving an important message that his wound culture did show bacteria and an antibiotic was called to the [facility].

## 2018-05-12 NOTE — Telephone Encounter (Signed)
-----   Message from Landis Martins, [place] sent at 05/09/2018 11:40 AM EDT ----- + culture for Staphylococcus lugdunensis Please let patient know culture is + and send bactrim 80/160 bid x 28 tabs to pharmacy  Thanks Dr. Cannon Kettle

## 2018-05-12 NOTE — Telephone Encounter (Signed)
I informed pt of Dr. Leeanne Rio review of results and orders for the antibiotic.

## 2018-05-12 NOTE — Telephone Encounter (Signed)
Unable to leave a message pt's mobile phone has calling restrictions.

## 2018-05-14 ENCOUNTER — Other Ambulatory Visit: Payer: Self-pay | Admitting: Sports Medicine

## 2018-05-14 DIAGNOSIS — M79674 Pain in right toe(s): Secondary | ICD-10-CM

## 2018-05-14 DIAGNOSIS — R0989 Other specified symptoms and signs involving the circulatory and respiratory systems: Secondary | ICD-10-CM

## 2018-05-14 DIAGNOSIS — N2581 Secondary hyperparathyroidism of renal origin: Secondary | ICD-10-CM | POA: Diagnosis not present

## 2018-05-14 DIAGNOSIS — E1129 Type 2 diabetes mellitus with other diabetic kidney complication: Secondary | ICD-10-CM | POA: Diagnosis not present

## 2018-05-14 DIAGNOSIS — D631 Anemia in chronic kidney disease: Secondary | ICD-10-CM | POA: Diagnosis not present

## 2018-05-14 DIAGNOSIS — N186 End stage renal disease: Secondary | ICD-10-CM | POA: Diagnosis not present

## 2018-05-14 DIAGNOSIS — L97511 Non-pressure chronic ulcer of other part of right foot limited to breakdown of skin: Secondary | ICD-10-CM

## 2018-05-14 DIAGNOSIS — I739 Peripheral vascular disease, unspecified: Secondary | ICD-10-CM

## 2018-05-15 ENCOUNTER — Ambulatory Visit (HOSPITAL_COMMUNITY)
Admission: RE | Admit: 2018-05-15 | Discharge: 2018-05-15 | Disposition: A | Discharge status: Home / Self Care | Payer: Medicare Other | Source: Ambulatory Visit | Attending: Cardiology | Admitting: Cardiology

## 2018-05-15 DIAGNOSIS — L97511 Non-pressure chronic ulcer of other part of right foot limited to breakdown of skin: Secondary | ICD-10-CM | POA: Insufficient documentation

## 2018-05-15 DIAGNOSIS — I739 Peripheral vascular disease, unspecified: Secondary | ICD-10-CM | POA: Diagnosis not present

## 2018-05-15 DIAGNOSIS — R0989 Other specified symptoms and signs involving the circulatory and respiratory systems: Secondary | ICD-10-CM | POA: Diagnosis not present

## 2018-05-15 DIAGNOSIS — M79674 Pain in right toe(s): Secondary | ICD-10-CM

## 2018-05-16 DIAGNOSIS — N2581 Secondary hyperparathyroidism of renal origin: Secondary | ICD-10-CM | POA: Diagnosis not present

## 2018-05-16 DIAGNOSIS — N186 End stage renal disease: Secondary | ICD-10-CM | POA: Diagnosis not present

## 2018-05-16 DIAGNOSIS — D631 Anemia in chronic kidney disease: Secondary | ICD-10-CM | POA: Diagnosis not present

## 2018-05-16 DIAGNOSIS — E1129 Type 2 diabetes mellitus with other diabetic kidney complication: Secondary | ICD-10-CM | POA: Diagnosis not present

## 2018-05-19 DIAGNOSIS — D631 Anemia in chronic kidney disease: Secondary | ICD-10-CM | POA: Diagnosis not present

## 2018-05-19 DIAGNOSIS — N186 End stage renal disease: Secondary | ICD-10-CM | POA: Diagnosis not present

## 2018-05-19 DIAGNOSIS — E1129 Type 2 diabetes mellitus with other diabetic kidney complication: Secondary | ICD-10-CM | POA: Diagnosis not present

## 2018-05-19 DIAGNOSIS — N2581 Secondary hyperparathyroidism of renal origin: Secondary | ICD-10-CM | POA: Diagnosis not present

## 2018-05-20 ENCOUNTER — Ambulatory Visit (INDEPENDENT_AMBULATORY_CARE_PROVIDER_SITE_OTHER): Payer: Medicare Other | Admitting: Sports Medicine

## 2018-05-20 ENCOUNTER — Other Ambulatory Visit: Payer: Self-pay

## 2018-05-20 ENCOUNTER — Encounter: Payer: Self-pay | Admitting: Internal Medicine

## 2018-05-20 ENCOUNTER — Ambulatory Visit (INDEPENDENT_AMBULATORY_CARE_PROVIDER_SITE_OTHER): Payer: Medicare Other | Admitting: Internal Medicine

## 2018-05-20 ENCOUNTER — Encounter: Payer: Self-pay | Admitting: Sports Medicine

## 2018-05-20 VITALS — BP 145/73 | HR 98 | Temp 99.2°F | Ht 72.0 in | Wt 207.4 lb

## 2018-05-20 DIAGNOSIS — R2681 Unsteadiness on feet: Secondary | ICD-10-CM | POA: Diagnosis not present

## 2018-05-20 DIAGNOSIS — G8929 Other chronic pain: Secondary | ICD-10-CM

## 2018-05-20 DIAGNOSIS — L97511 Non-pressure chronic ulcer of other part of right foot limited to breakdown of skin: Secondary | ICD-10-CM

## 2018-05-20 DIAGNOSIS — M549 Dorsalgia, unspecified: Secondary | ICD-10-CM | POA: Diagnosis not present

## 2018-05-20 DIAGNOSIS — Z79891 Long term (current) use of opiate analgesic: Secondary | ICD-10-CM | POA: Diagnosis not present

## 2018-05-20 DIAGNOSIS — M79674 Pain in right toe(s): Secondary | ICD-10-CM

## 2018-05-20 DIAGNOSIS — N186 End stage renal disease: Secondary | ICD-10-CM

## 2018-05-20 DIAGNOSIS — M79671 Pain in right foot: Secondary | ICD-10-CM | POA: Diagnosis not present

## 2018-05-20 DIAGNOSIS — E1142 Type 2 diabetes mellitus with diabetic polyneuropathy: Secondary | ICD-10-CM

## 2018-05-20 DIAGNOSIS — F17290 Nicotine dependence, other tobacco product, uncomplicated: Secondary | ICD-10-CM

## 2018-05-20 DIAGNOSIS — Z7982 Long term (current) use of aspirin: Secondary | ICD-10-CM | POA: Diagnosis not present

## 2018-05-20 DIAGNOSIS — I739 Peripheral vascular disease, unspecified: Secondary | ICD-10-CM

## 2018-05-20 MED ORDER — TRAMADOL HCL 50 MG PO TABS: 50.0000 mg | ORAL_TABLET | Freq: Four times a day (QID) | ORAL | 0 refills | Status: DC | PRN

## 2018-05-20 NOTE — [place] Notes (Signed)
CC: Chronic right upper back pain.  HPI:  [name] is a 62 y.o. with medical history as listed below presenting with right upper back pain.  Reports of greater than 1 and half year history of a localized right upper back pain that is constant with intermittent shooting pain sensation.  He denies any radiating symptoms or aggravating factors.  Reports that the pain suddenly came on one day while he was lying in bed.  Pain is nonradiating in nature and he rates it at 8/10 that is sometimes worsened when he lies in bed.  In the past, he has tried Tylenol, muscle relaxants and a muscle stimulator with no relief.  He is currently taking tramadol prescribed by his podiatrist right foot pain and reports of no relief.  His wife present at the clinic today reported that he has an unsteady gait.  Chronic right upper back pain: 1.5-year history of localized right upper back pain that is constant, nonradiating, intermittent shooting and stabbing sensation.  Rates pain at 8 out of 10 and sometimes worsens with movement of body.  Has tried Tylenol, muscle relaxants and muscle stimulators with no relief.  Is unclear what could be causing his pain and differential includes musculoskeletal etiology or neuropathic pain.  I believe patient will benefit from long-term physical therapy to help with the probable musculoskeletal pain and his gait. -Referral for physical therapy placed -Return to clinic 1 to 3 months for follow-up with PCP  Past Medical History:  Diagnosis Date  . Anemia   . Anginal pain ([place])   . CAD (coronary artery disease)    a. per [facility] s/p 3.65mm x 37mm Vision BMS to mid LAD 12/2009 and Xience DES to mid LAD 10/2010.  [name] Chronic diastolic CHF (congestive heart failure) ([place])   . Colon polyps   . Daily headache   . ESRD on dialysis [facility]) since ~ 2008   "MWF; Jene[facility] Rinks" (03/04/2017)  . GERD (gastroesophageal reflux disease)   . H/O TIA (transient ischemic attack) 04/01/2015  .  H/O TIA (transient ischemic attack) 04/01/2015  . Heart murmur   . Hematochezia    a. 2014: colonscopy, which showed moderately-sized internal hemorrhoids, two 18mm polyps in transverse colon and ascending colon that were resected, five 2-12mm polyps in sigmoid colon, descending colon, transverse colon, and ascending colon that were resected. An upper endoscopy was performed and showed normal esophagus, stomach, and duodenum.  . Hematuria    a. H/o hematuria 2014 with cystoscopy that was unrevealing for his source of hematuria. He underwent a kidney ultrasound on 10/14 that showed mildly echogenic and scarred kidneys compatible with medical renal disease, without hydronephrosis or renal calculi.  [name] History of blood transfusion    "had colonoscopy done; they had to give me some blood"  . History of kidney stones   . Hyperlipidemia   . Hypertension   . On home oxygen therapy    "2L prn" (07/21/2015); "been off it for awhile" (03/04/2017)  . Pneumonia   . Renal insufficiency   . 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 Type II diabetes mellitus ([place])   . Wears dentures    Review of Systems: As per HPI  Physical Exam:  Vitals:   05/20/18 1409  BP: (!) 145/73  Pulse: 98  Temp: 99.2 F (37.3 C)  TempSrc: Oral  SpO2: 98%  Weight: 207 lb 6.4 oz (94.1 kg)  Height: 6' (1.829 m)  Physical Exam  Constitutional: He is well-developed, well-nourished, and in no distress. Vital signs are normal.  In moderate distress secondary to pain  Cardiovascular: Normal rate, regular rhythm and normal heart sounds.  Pulmonary/Chest: Effort normal and breath sounds normal.  Musculoskeletal:       Arms:   Assessment & Plan:   See Encounters Tab for problem based charting.  Patient discussed with Dr. Lynnae January

## 2018-05-20 NOTE — Progress Notes (Signed)
Subjective: [name] [name] is a 62 y.o. male patient seen in office for evaluation of ulceration of the right great toe. Patient has a history of diabetes and a blood glucose level today that has been not recorded they check at dialysis. Patient states that he has been putting betadine to area, reports still bleeding and pain 10/10. Denies nausea/fever/vomiting/chills/night sweats/shortness of breath/pain.   Patient Active Problem List   Diagnosis Date Noted  . Hx of CABG 02/02/2018  . Hypervolemia associated with renal insufficiency 01/26/2018  . Hyperkalemia 01/26/2018  . Restless leg syndrome 01/14/2018  . Anemia 01/14/2018  . Tobacco use 10/29/2017  . Mild cognitive impairment 06/13/2017  . Chest pain 06/07/2017  . Skin ulcer of toe of right foot, limited to breakdown of skin ([place])   . Callus of foot 03/07/2017  . Pulmonary hypertension ([place]) 07/28/2016  . Stroke-like symptoms 06/30/2016  . Peripheral neuropathy 05/07/2016  . S/P coronary artery stent placement 05/07/2016  . Nausea and vomiting 04/02/2016  . ESRD (end stage renal disease) ([place]) 03/18/2016  . Mitral regurgitation   . Diabetic neuropathy ([place]) 07/29/2015  . Depression 07/22/2015  . GERD (gastroesophageal reflux disease) 06/04/2015  . Malnutrition of moderate degree ([place]) 05/17/2015  . Thrombocytopenia ([place]) 05/16/2015  . Weight loss 05/16/2015  . H/O TIA (transient ischemic attack) 04/01/2015  . Diastolic dysfunction-grade 2 12/13/2014  . HCAP (healthcare-associated pneumonia) 10/01/2014  . 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 Arm pain, right 05/25/2014  . Numbness 05/25/2014  . Hypertension 04/27/2014  . CAD -S/P LAD BMS 2011, LAD DES 2012- patent cors Feb 2016 04/27/2014  . Diabetes mellitus type 2, diet-controlled ([place]) 04/27/2014  . Background diabetic retinopathy ([place]) 08/13/2012  . Primary localized osteoarthrosis, lower leg 04/15/2012  . Senile nuclear sclerosis 11/29/2010  .  Vitreous hemorrhage ([place]) 11/29/2010  . Atherosclerotic heart disease of native coronary artery without angina pectoris 12/12/2009  . Hypercholesterolemia 11/30/2003   Current Outpatient Medications on File Prior to Visit  Medication Sig Dispense Refill  . amLODipine (NORVASC) 10 MG tablet Take 1 tablet (10 mg total) by mouth at bedtime. 30 tablet 11  . aspirin EC 81 MG tablet Take 81 mg by mouth daily.    [name] atorvastatin (LIPITOR) 20 MG tablet Take 20 mg by mouth at bedtime.     . cadexomer iodine (IODOSORB) 0.9 % gel Apply 1 application topically daily as needed for wound care. 40 g 0  . cinacalcet (SENSIPAR) 90 MG tablet Take 180 mg by mouth every Monday, Wednesday, and Friday with hemodialysis.     [name] isosorbide mononitrate (IMDUR) 30 MG 24 hr tablet Take 1 tablet (30 mg total) by mouth daily. 30 tablet 11  . methocarbamol (ROBAXIN) 500 MG tablet Take 1 tablet (500 mg total) by mouth 2 (two) times daily. 20 tablet 0  . metoCLOPramide (REGLAN) 5 MG tablet Take 5 mg by mouth 3 (three) times daily as needed for nausea.    . multivitamin (RENA-VIT) TABS tablet Take 1 tablet by mouth at bedtime. 30 tablet 0  . nitroGLYCERIN (NITROSTAT) 0.4 MG SL tablet Place 0.4 mg under the tongue every 5 (five) minutes as needed for chest pain.    [name] ondansetron (ZOFRAN) 4 MG tablet Take 1 tablet (4 mg total) by mouth 4 (four) times daily as needed for nausea or vomiting. 20 tablet 0  . oxyCODONE (OXY IR/ROXICODONE) 5 MG immediate release tablet Take 5 mg by mouth every 6 (six) hours as needed for severe pain.  0  . promethazine (PHENERGAN) 25 MG tablet TAKE 1 TABLET BY MOUTH EVERY TWELVE HOURS AS NEEDED  3  . rOPINIRole (REQUIP) 0.25 MG tablet Take 1 tablet (0.25 mg total) by mouth at bedtime. 30 tablet 5  . sevelamer carbonate (RENVELA) 800 MG tablet Take 4 tablets (3,200 mg total) by mouth 3 (three) times daily with meals. (Patient taking differently: Take 1,600-3,200 mg by mouth See admin instructions. Take 4  tablets (3200 mg) by mouth three times daily with meals and 2 tablets (1600 mg) with snacks) 320 tablet 0  . sulfamethoxazole-trimethoprim (BACTRIM DS,SEPTRA DS) 800-160 MG tablet Take 1 tablet by mouth 2 (two) times daily. 28 tablet 0   No current facility-administered medications on file prior to visit.    Allergies  Allergen Reactions  . Enalapril Hives and Rash  . Latex Rash  . Tape Rash and Other (See Comments)    TAPE MAKES SKIN BREAK OUT AND TURN RED    Recent Results (from the past 2160 hour(s))  WOUND CULTURE     Status: None   Collection Time: 05/06/18  2:00 PM  Result Value Ref Range   MICRO NUMBER: [id number]    SPECIMEN QUALITY: ADEQUATE    SOURCE: NOT GIVEN    STATUS: FINAL    GRAM STAIN:      No white blood cells seen No epithelial cells seen No organisms seen   ISOLATE 1: Staphylococcus lugdunensis     Comment: Moderate growth of Staphylococcus lugdunensis Negative for inducible clindamycin resistance.      Susceptibility   Staphylococcus lugdunensis - AEROBIC CULT, GRAM STAIN POSITIVE 1    VANCOMYCIN 1 Sensitive     CIPROFLOXACIN <=0.5 Sensitive     CLINDAMYCIN  Intermediate     LEVOFLOXACIN <=0.12 Sensitive     ERYTHROMYCIN >=8 Resistant     GENTAMICIN <=0.5 Sensitive     OXACILLIN* NR Resistant      * Oxacillin-resistant staphylococci are resistant toall currently available beta-lactam antimicrobialagents including penicillins, beta lactam/beta-lactamase inhibitor combinations, and cephems withstaphylococcal indications, including Cefazolin.    TETRACYCLINE 2 Sensitive     TRIMETH/SULFA* <=10 Sensitive      * Oxacillin-resistant staphylococci are resistant toall currently available beta-lactam antimicrobialagents including penicillins, beta lactam/beta-lactamase inhibitor combinations, and cephems withstaphylococcal indications, including Cefazolin.Legend:S = Susceptible  I = IntermediateR = Resistant  NS = Not susceptible* = Not tested  NR = Not reported**NN =  See antimicrobic comments    Objective: There were no vitals filed for this visit.  General: Patient is awake, alert, oriented x 3 and in no acute distress.  Dermatology: Skin is warm and dry bilateral with a partial thickness ulceration present right plantar medial hallux. Ulceration measures 0.3cm x 0.2 cm x 0.1 cm. There is a keratotic border with a granular base. The ulceration does not probe to bone. There is no malodor, no active drainage, no erythema, no edema. No acute signs of infection.  Nails x 10 short and thick consistent with onychomycosis    Vascular: Dorsalis Pedis pulse = 2/4 Bilateral,  Posterior Tibial pulse = 1/4 Bilateral,  Capillary Fill Time < 5 seconds  Neurologic: Protective sensation diminished bilateral.   Musculosketal: No Pain with palpation to ulcerated area. No pain with compression to calves bilateral. Pes planus and hallux extensus gross bony deformities noted bilateral.  Assessment and Plan:  Problem List Items Addressed This Visit      Endocrine   Diabetic neuropathy ([place])     Genitourinary  ESRD (end stage renal disease) ([place])    Other Visit Diagnoses    Skin ulcer of right great toe, limited to breakdown of skin ([place])    -  Primary   Great toe pain, right       PAD (peripheral artery disease) ([place])          -Examined patient and discussed the progression of the wound and treatment alternatives. - Excisionally dedbrided ulcerations to healthy bleeding borders using a sterile chisel blade  -Applied Iodosorb and dry sterile dressing and instructed patient to continue with daily dressings at home consisting of the same -Continue bactrim until completed -Continue with vascular f/u with Dr. Gwenlyn Found  -Refilled tramadol for pain -Diabetic shoes to offload ulcer; office to contact primary care for approval / certification;  Office to arrange shoe fitting and dispensing if he qualifies for shoes. - Advised patient to go to the ER or return to office  if the wound worsens or if constitutional symptoms are present. -Patient to return to office in 3 weeks for follow up care and evaluation or sooner if problems arise.  Landis Martins, DPM

## 2018-05-20 NOTE — Patient Instructions (Signed)
[name] [name],  It was a pleasure taking care of you here at the clinic today and so already hear that you are having this nagging pain.  I think you might have a musculoskeletal pain and would really benefit from physical therapy.  I am putting in a referral and I will advise for you to continue going as you might notice some relief with consistent therapy.  ~Dr. Eileen Stanford

## 2018-05-21 DIAGNOSIS — D631 Anemia in chronic kidney disease: Secondary | ICD-10-CM | POA: Diagnosis not present

## 2018-05-21 DIAGNOSIS — E1129 Type 2 diabetes mellitus with other diabetic kidney complication: Secondary | ICD-10-CM | POA: Diagnosis not present

## 2018-05-21 DIAGNOSIS — N2581 Secondary hyperparathyroidism of renal origin: Secondary | ICD-10-CM | POA: Diagnosis not present

## 2018-05-21 DIAGNOSIS — N186 End stage renal disease: Secondary | ICD-10-CM | POA: Diagnosis not present

## 2018-05-21 NOTE — [place] Notes (Signed)
Internal Medicine Clinic Attending  I saw and evaluated the patient.  I personally confirmed the key portions of the history and exam documented by Dr. Agyei and I reviewed pertinent patient test results.  The assessment, diagnosis, and plan were formulated together and I agree with the documentation in the resident's note.  

## 2018-05-23 DIAGNOSIS — N186 End stage renal disease: Secondary | ICD-10-CM | POA: Diagnosis not present

## 2018-05-23 DIAGNOSIS — D631 Anemia in chronic kidney disease: Secondary | ICD-10-CM | POA: Diagnosis not present

## 2018-05-23 DIAGNOSIS — N2581 Secondary hyperparathyroidism of renal origin: Secondary | ICD-10-CM | POA: Diagnosis not present

## 2018-05-23 DIAGNOSIS — E1129 Type 2 diabetes mellitus with other diabetic kidney complication: Secondary | ICD-10-CM | POA: Diagnosis not present

## 2018-05-26 ENCOUNTER — Ambulatory Visit: Payer: Medicare Other | Admitting: Orthotics

## 2018-05-26 DIAGNOSIS — N2581 Secondary hyperparathyroidism of renal origin: Secondary | ICD-10-CM | POA: Diagnosis not present

## 2018-05-26 DIAGNOSIS — D631 Anemia in chronic kidney disease: Secondary | ICD-10-CM | POA: Diagnosis not present

## 2018-05-26 DIAGNOSIS — N186 End stage renal disease: Secondary | ICD-10-CM | POA: Diagnosis not present

## 2018-05-26 DIAGNOSIS — E1129 Type 2 diabetes mellitus with other diabetic kidney complication: Secondary | ICD-10-CM | POA: Diagnosis not present

## 2018-05-27 ENCOUNTER — Encounter: Payer: Self-pay | Admitting: Cardiovascular Disease

## 2018-05-27 ENCOUNTER — Ambulatory Visit (INDEPENDENT_AMBULATORY_CARE_PROVIDER_SITE_OTHER): Payer: Medicare Other | Admitting: Cardiovascular Disease

## 2018-05-27 ENCOUNTER — Encounter: Payer: Self-pay | Admitting: *Deleted

## 2018-05-27 VITALS — BP 158/80 | HR 92 | Ht 72.0 in | Wt 206.6 lb

## 2018-05-27 DIAGNOSIS — Z01818 Encounter for other preprocedural examination: Secondary | ICD-10-CM | POA: Diagnosis not present

## 2018-05-27 DIAGNOSIS — I739 Peripheral vascular disease, unspecified: Secondary | ICD-10-CM

## 2018-05-27 DIAGNOSIS — I2510 Atherosclerotic heart disease of native coronary artery without angina pectoris: Secondary | ICD-10-CM | POA: Diagnosis not present

## 2018-05-27 DIAGNOSIS — Z9861 Coronary angioplasty status: Secondary | ICD-10-CM | POA: Diagnosis not present

## 2018-05-27 DIAGNOSIS — I998 Other disorder of circulatory system: Secondary | ICD-10-CM

## 2018-05-27 DIAGNOSIS — I70229 Atherosclerosis of native arteries of extremities with rest pain, unspecified extremity: Secondary | ICD-10-CM

## 2018-05-27 DIAGNOSIS — R011 Cardiac murmur, unspecified: Secondary | ICD-10-CM

## 2018-05-27 DIAGNOSIS — Z720 Tobacco use: Secondary | ICD-10-CM

## 2018-05-27 NOTE — Assessment & Plan Note (Signed)
History of essential hypertension her blood pressure measured at 158/80.  He is on amlodipine.  Continue current meds at current dosing.

## 2018-05-27 NOTE — Assessment & Plan Note (Signed)
Mr. Wert was referred to me by Dr. Cannon Kettle for critical limb ischemia.  He has had a nonhealing ulcer on the plantar surface of his right great toe for a year.  Recent Dopplers revealed patent vessels except for an occluded anterior tibial.  His posterior tibial is easily palpable at the ankle.  He does have chronic renal insufficiency on hemodialysis.  I am going to arrange for him to undergo angiography and potential endovascular therapy for wound healing.

## 2018-05-27 NOTE — Assessment & Plan Note (Signed)
History of CAD status post bare-metal stenting in 2011, drug-eluting stenting in 2012 ultimately coronary bypass grafting x2 at [facility] in March of this year with valve replacement as well.

## 2018-05-27 NOTE — Progress Notes (Signed)
05/27/2018 [name] [name]   Feb 27, 1956  [id number]  Primary Physician Neva Seat, MD Primary Cardiologist: Lorretta Harp MD [name] [name], [place]  HPI:  [name] [name] is a 62 y.o. mildly overweight married an American male father 25, grandfather of 18 grandparents who is accompanied by his wife [name].  He was referred by Dr. Cannon Kettle, his podiatrist, for evaluation of critical limb ischemia.  He does have a history of treated hypertension, diabetes and hyperlipidemia.  He does still smoke.  He said multiple stents in the past back in 2011 and 2012 ultimately requiring coronary artery bypass grafting x2 in March of this year at [facility] along with valve replacement.  He has been on hemodialysis for the last 10 years.  He had a nonhealing ulcer on the plantar surface of his right great toe for the last year which has not healed despite debridement.  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   Current Meds  Medication Sig  . amLODipine (NORVASC) 10 MG tablet Take 1 tablet (10 mg total) by mouth at bedtime.  [name] aspirin EC 81 MG tablet Take 81 mg by mouth daily.  [name] atorvastatin (LIPITOR) 20 MG tablet Take 20 mg by mouth at bedtime.   . cinacalcet (SENSIPAR) 90 MG tablet Take 180 mg by mouth every Monday, Wednesday, and Friday with hemodialysis.   [name] isosorbide mononitrate (IMDUR) 30 MG 24 hr tablet Take 1 tablet (30 mg total) by mouth daily.  . multivitamin (RENA-VIT) TABS tablet Take 1 tablet by mouth at bedtime.  . nitroGLYCERIN (NITROSTAT) 0.4 MG SL tablet Place 0.4 mg under the tongue every 5 (five) minutes as needed for chest pain.  [name] ondansetron (ZOFRAN) 4 MG tablet Take 1 tablet (4 mg total) by mouth 4 (four) times daily as needed for nausea or vomiting.  [name] rOPINIRole (REQUIP) 0.25 MG tablet Take 1 tablet (0.25 mg total) by mouth at bedtime.  . sevelamer carbonate (RENVELA) 800 MG tablet Take 4 tablets (3,200 mg total) by mouth 3  (three) times daily with meals. (Patient taking differently: Take 1,600-3,200 mg by mouth See admin instructions. Take 4 tablets (3200 mg) by mouth three times daily with meals and 2 tablets (1600 mg) with snacks)  . sulfamethoxazole-trimethoprim (BACTRIM DS,SEPTRA DS) 800-160 MG tablet Take 1 tablet by mouth 2 (two) times daily.  . traMADol (ULTRAM) 50 MG tablet Take 1 tablet (50 mg total) by mouth every 6 (six) hours as needed.     Allergies  Allergen Reactions  . Enalapril Hives and Rash  . Latex Rash  . Tape Rash and Other (See Comments)    TAPE MAKES SKIN BREAK OUT AND TURN RED    Social History   Socioeconomic History  . Marital status: Married    Spouse name: Not on file  . Number of children: Not on file  . Years of education: Not on file  . Highest education level: Not on file  Occupational History  . Not on file  Social Needs  . Financial resource strain: Not on file  . Food insecurity:    Worry: Not on file    Inability: Not on file  . Transportation needs:    Medical: Not on file    Non-medical: Not on file  Tobacco Use  . Smoking status: Current Some Day Smoker    Packs/day: 0.30    Years: 8.00    Pack years: 2.40    Types: Cigars  .  Smokeless tobacco: Never Used  Substance and Sexual Activity  . Alcohol use: No    Alcohol/week: 0.0 standard drinks  . Drug use: No  . Sexual activity: Not Currently  Lifestyle  . Physical activity:    Days per week: Not on file    Minutes per session: Not on file  . Stress: Not on file  Relationships  . Social connections:    Talks on phone: Not on file    Gets together: Not on file    Attends religious service: Not on file    Active member of club or organization: Not on file    Attends meetings of clubs or organizations: Not on file    Relationship status: Not on file  . Intimate partner violence:    Fear of current or ex partner: Not on file    Emotionally abused: Not on file    Physically abused: Not on file      Forced sexual activity: Not on file  Other Topics Concern  . Not on file  Social History Narrative   Moved from [place], [place] to [place] in 11/2014.      Lives with fiancee      Quit smoking 02/2017     Review of Systems: General: negative for chills, fever, night sweats or weight changes.  Cardiovascular: negative for chest pain, dyspnea on exertion, edema, orthopnea, palpitations, paroxysmal nocturnal dyspnea or shortness of breath Dermatological: negative for rash Respiratory: negative for cough or wheezing Urologic: negative for hematuria Abdominal: negative for nausea, vomiting, diarrhea, bright red blood per rectum, melena, or hematemesis Neurologic: negative for visual changes, syncope, or dizziness All other systems reviewed and are otherwise negative except as noted above.    Blood pressure (!) 158/80, pulse 92, height 6' (1.829 m), weight 206 lb 9.6 oz (93.7 kg).  General appearance: alert and no distress Neck: no adenopathy, no carotid bruit, no JVD, supple, symmetrical, trachea midline and thyroid not enlarged, symmetric, no tenderness/mass/nodules Lungs: clear to auscultation bilaterally Heart: Soft outflow tract murmur Extremities: extremities normal, atraumatic, no cyanosis or edema Pulses: Absent right dorsalis pedis, patent posterior tibial Skin: Skin color, texture, turgor normal. No rashes or lesions Neurologic: Alert and oriented X 3, normal strength and tone. Normal symmetric reflexes. Normal coordination and gait  EKG not performed today  ASSESSMENT AND PLAN:   Hypertension History of essential hypertension her blood pressure measured at 158/80.  He is on amlodipine.  Continue current meds at current dosing.  CAD -S/P LAD BMS 2011, LAD DES 2012- patent cors Feb 2016 History of CAD status post bare-metal stenting in 2011, drug-eluting stenting in 2012 ultimately coronary bypass grafting x2 at [facility] in March of this year with valve  replacement as well.  Tobacco use Of ongoing tobacco abuse of 2 cigarettes a day down from 1/2 pack every other day recalcitrant to risk factor modification.  Critical lower limb ischemia Mr. Woolum was referred to me by Dr. Cannon Kettle for critical limb ischemia.  He has had a nonhealing ulcer on the plantar surface of his right great toe for a year.  Recent Dopplers revealed patent vessels except for an occluded anterior tibial.  His posterior tibial is easily palpable at the ankle.  He does have chronic renal insufficiency on hemodialysis.  I am going to arrange for him to undergo angiography and potential endovascular therapy for wound healing.      Lorretta Harp MD FACP,FACC,FAHA, [facility] 05/27/2018 3:04 PM

## 2018-05-27 NOTE — Patient Instructions (Addendum)
Your physician has requested that you have an echocardiogram. Echocardiography is a painless test that uses sound waves to create images of your heart. It provides your doctor with information about the size and shape of your heart and how well your heart's chambers and valves are working. This procedure takes approximately one hour. There are no restrictions for this procedure.       [place] [facility] [address] Dept: [phone] Loc: [phone]  Kalel Harty  05/27/2018  You are scheduled for a Peripheral Angiogram on Thursday, August 22 with Dr. Quay Burow.  1. Please arrive at the [facility] (Main Entrance A) at [facility]: [address] [place], [place] [address] at 5:30 AM (This time is two hours before your procedure to ensure your preparation). Free valet parking service is available.   Special note: Every effort is made to have your procedure done on time. Please understand that emergencies sometimes delay scheduled procedures.  2. Diet: Do not eat solid foods after midnight.  The patient may have clear liquids until 5am upon the day of the procedure.  3. Labs: You will need to have blood drawn TODAY  4. Medication instructions in preparation for your procedure  On the morning of your procedure, take your Aspirin and any morning medicines NOT listed above.  You may use sips of water.  5. Plan for one night stay--bring personal belongings. 6. Bring a current list of your medications and current insurance cards. 7. You MUST have a responsible person to drive you home. 8. Someone MUST be with you the first 24 hours after you arrive home or your discharge will be delayed. 9. Please wear clothes that are easy to get on and off and wear slip-on shoes.  Thank you for allowing [place] to care for you!   --  Invasive Cardiovascular services    Your  physician has requested that you have a lower extremity arterial duplex. This test is an ultrasound of the arteries in the legs or arms. It looks at arterial blood flow in the legs and arms. Allow one hour for Lower and Upper Arterial scans. There are no restrictions or special instructions 1 WEEK AFTER PROCEDURE  Your physician recommends that you schedule a follow-up appointment in: [address] [place]

## 2018-05-27 NOTE — H&P (View-Only) (Signed)
05/27/2018 [name] [name]   10-Oct-1955  [id number]  Primary Physician Neva Seat, MD Primary Cardiologist: Lorretta Harp MD Lupe Carney, [place]  HPI:  [name] [name] is a 62 y.o. mildly overweight married an American male father 46, grandfather of 21 grandparents who is accompanied by his wife [name].  He was referred by Dr. Cannon Kettle, his podiatrist, for evaluation of critical limb ischemia.  He does have a history of treated hypertension, diabetes and hyperlipidemia.  He does still smoke.  He said multiple stents in the past back in 2011 and 2012 ultimately requiring coronary artery bypass grafting x2 in March of this year at [facility] along with valve replacement.  He has been on hemodialysis for the last 10 years.  He had a nonhealing ulcer on the plantar surface of his right great toe for the last year which has not healed despite debridement.  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   Current Meds  Medication Sig  . amLODipine (NORVASC) 10 MG tablet Take 1 tablet (10 mg total) by mouth at bedtime.  [name] aspirin EC 81 MG tablet Take 81 mg by mouth daily.  [name] atorvastatin (LIPITOR) 20 MG tablet Take 20 mg by mouth at bedtime.   . cinacalcet (SENSIPAR) 90 MG tablet Take 180 mg by mouth every Monday, Wednesday, and Friday with hemodialysis.   [name] isosorbide mononitrate (IMDUR) 30 MG 24 hr tablet Take 1 tablet (30 mg total) by mouth daily.  . multivitamin (RENA-VIT) TABS tablet Take 1 tablet by mouth at bedtime.  . nitroGLYCERIN (NITROSTAT) 0.4 MG SL tablet Place 0.4 mg under the tongue every 5 (five) minutes as needed for chest pain.  [name] ondansetron (ZOFRAN) 4 MG tablet Take 1 tablet (4 mg total) by mouth 4 (four) times daily as needed for nausea or vomiting.  [name] rOPINIRole (REQUIP) 0.25 MG tablet Take 1 tablet (0.25 mg total) by mouth at bedtime.  . sevelamer carbonate (RENVELA) 800 MG tablet Take 4 tablets (3,200 mg total) by mouth 3  (three) times daily with meals. (Patient taking differently: Take 1,600-3,200 mg by mouth See admin instructions. Take 4 tablets (3200 mg) by mouth three times daily with meals and 2 tablets (1600 mg) with snacks)  . sulfamethoxazole-trimethoprim (BACTRIM DS,SEPTRA DS) 800-160 MG tablet Take 1 tablet by mouth 2 (two) times daily.  . traMADol (ULTRAM) 50 MG tablet Take 1 tablet (50 mg total) by mouth every 6 (six) hours as needed.     Allergies  Allergen Reactions  . Enalapril Hives and Rash  . Latex Rash  . Tape Rash and Other (See Comments)    TAPE MAKES SKIN BREAK OUT AND TURN RED    Social History   Socioeconomic History  . Marital status: Married    Spouse name: Not on file  . Number of children: Not on file  . Years of education: Not on file  . Highest education level: Not on file  Occupational History  . Not on file  Social Needs  . Financial resource strain: Not on file  . Food insecurity:    Worry: Not on file    Inability: Not on file  . Transportation needs:    Medical: Not on file    Non-medical: Not on file  Tobacco Use  . Smoking status: Current Some Day Smoker    Packs/day: 0.30    Years: 8.00    Pack years: 2.40    Types: Cigars  .  Smokeless tobacco: Never Used  Substance and Sexual Activity  . Alcohol use: No    Alcohol/week: 0.0 standard drinks  . Drug use: No  . Sexual activity: Not Currently  Lifestyle  . Physical activity:    Days per week: Not on file    Minutes per session: Not on file  . Stress: Not on file  Relationships  . Social connections:    Talks on phone: Not on file    Gets together: Not on file    Attends religious service: Not on file    Active member of club or organization: Not on file    Attends meetings of clubs or organizations: Not on file    Relationship status: Not on file  . Intimate partner violence:    Fear of current or ex partner: Not on file    Emotionally abused: Not on file    Physically abused: Not on file      Forced sexual activity: Not on file  Other Topics Concern  . Not on file  Social History Narrative   Moved from [place], [place] to [place] in 11/2014.      Lives with fiancee      Quit smoking 02/2017     Review of Systems: General: negative for chills, fever, night sweats or weight changes.  Cardiovascular: negative for chest pain, dyspnea on exertion, edema, orthopnea, palpitations, paroxysmal nocturnal dyspnea or shortness of breath Dermatological: negative for rash Respiratory: negative for cough or wheezing Urologic: negative for hematuria Abdominal: negative for nausea, vomiting, diarrhea, bright red blood per rectum, melena, or hematemesis Neurologic: negative for visual changes, syncope, or dizziness All other systems reviewed and are otherwise negative except as noted above.    Blood pressure (!) 158/80, pulse 92, height 6' (1.829 m), weight 206 lb 9.6 oz (93.7 kg).  General appearance: alert and no distress Neck: no adenopathy, no carotid bruit, no JVD, supple, symmetrical, trachea midline and thyroid not enlarged, symmetric, no tenderness/mass/nodules Lungs: clear to auscultation bilaterally Heart: Soft outflow tract murmur Extremities: extremities normal, atraumatic, no cyanosis or edema Pulses: Absent right dorsalis pedis, patent posterior tibial Skin: Skin color, texture, turgor normal. No rashes or lesions Neurologic: Alert and oriented X 3, normal strength and tone. Normal symmetric reflexes. Normal coordination and gait  EKG not performed today  ASSESSMENT AND PLAN:   Hypertension History of essential hypertension her blood pressure measured at 158/80.  He is on amlodipine.  Continue current meds at current dosing.  CAD -S/P LAD BMS 2011, LAD DES 2012- patent cors Feb 2016 History of CAD status post bare-metal stenting in 2011, drug-eluting stenting in 2012 ultimately coronary bypass grafting x2 at [facility] in March of this year with valve  replacement as well.  Tobacco use Of ongoing tobacco abuse of 2 cigarettes a day down from 1/2 pack every other day recalcitrant to risk factor modification.  Critical lower limb ischemia Mr. Wimer was referred to me by Dr. Cannon Kettle for critical limb ischemia.  He has had a nonhealing ulcer on the plantar surface of his right great toe for a year.  Recent Dopplers revealed patent vessels except for an occluded anterior tibial.  His posterior tibial is easily palpable at the ankle.  He does have chronic renal insufficiency on hemodialysis.  I am going to arrange for him to undergo angiography and potential endovascular therapy for wound healing.      Lorretta Harp MD FACP,FACC,FAHA, [facility] 05/27/2018 3:04 PM

## 2018-05-27 NOTE — Assessment & Plan Note (Signed)
Of ongoing tobacco abuse of 2 cigarettes a day down from 1/2 pack every other day recalcitrant to risk factor modification.

## 2018-05-28 ENCOUNTER — Other Ambulatory Visit: Payer: Self-pay | Admitting: *Deleted

## 2018-05-28 ENCOUNTER — Other Ambulatory Visit: Payer: Self-pay | Admitting: Cardiovascular Disease

## 2018-05-28 DIAGNOSIS — N2581 Secondary hyperparathyroidism of renal origin: Secondary | ICD-10-CM | POA: Diagnosis not present

## 2018-05-28 DIAGNOSIS — D631 Anemia in chronic kidney disease: Secondary | ICD-10-CM | POA: Diagnosis not present

## 2018-05-28 DIAGNOSIS — N186 End stage renal disease: Secondary | ICD-10-CM | POA: Diagnosis not present

## 2018-05-28 DIAGNOSIS — E1129 Type 2 diabetes mellitus with other diabetic kidney complication: Secondary | ICD-10-CM | POA: Diagnosis not present

## 2018-05-28 DIAGNOSIS — I739 Peripheral vascular disease, unspecified: Secondary | ICD-10-CM

## 2018-05-28 LAB — BASIC METABOLIC PANEL
BUN/Creatinine Ratio: 4 — ABNORMAL LOW (ref 10–24)
BUN: 36 mg/dL — ABNORMAL HIGH (ref 8–27)
CO2: 28 mmol/L (ref 20–29)
CREATININE: 9.25 mg/dL — AB (ref 0.76–1.27)
Calcium: 9.4 mg/dL (ref 8.6–10.2)
Chloride: 91 mmol/L — ABNORMAL LOW (ref 96–106)
GFR calc non Af Amer: 6 mL/min/{1.73_m2} — ABNORMAL LOW (ref >59)
GFR, EST AFRICAN AMERICAN: 6 mL/min/{1.73_m2} — AB (ref >59)
Glucose: 86 mg/dL (ref 65–99)
POTASSIUM: 6.1 mmol/L — AB (ref 3.5–5.2)
SODIUM: 139 mmol/L (ref 134–144)

## 2018-05-28 LAB — CBC
HEMOGLOBIN: 12.7 g/dL — AB (ref 13.0–17.7)
Hematocrit: 39.8 % (ref 37.5–51.0)
MCH: 25.6 pg — ABNORMAL LOW (ref 26.6–33.0)
MCHC: 31.9 g/dL (ref 31.5–35.7)
MCV: 80 fL (ref 79–97)
Platelets: 200 10*3/uL (ref 150–450)
RBC: 4.97 x10E6/uL (ref 4.14–5.80)
RDW: 20.6 % — AB (ref 12.3–15.4)
WBC: 5.7 10*3/uL (ref 3.4–10.8)

## 2018-05-29 ENCOUNTER — Encounter (HOSPITAL_COMMUNITY)
Admission: RE | Disposition: A | Discharge status: Home / Self Care | Payer: Self-pay | Source: Ambulatory Visit | Attending: Cardiovascular Disease

## 2018-05-29 ENCOUNTER — Encounter (HOSPITAL_COMMUNITY): Payer: Self-pay | Admitting: *Deleted

## 2018-05-29 ENCOUNTER — Other Ambulatory Visit: Payer: Self-pay

## 2018-05-29 ENCOUNTER — Ambulatory Visit (HOSPITAL_COMMUNITY)
Admission: RE | Admit: 2018-05-29 | Discharge: 2018-05-29 | Disposition: A | Discharge status: Home / Self Care | Payer: Medicare Other | Source: Ambulatory Visit | Attending: Cardiovascular Disease | Admitting: Cardiovascular Disease

## 2018-05-29 DIAGNOSIS — Z7982 Long term (current) use of aspirin: Secondary | ICD-10-CM | POA: Diagnosis not present

## 2018-05-29 DIAGNOSIS — F17290 Nicotine dependence, other tobacco product, uncomplicated: Secondary | ICD-10-CM | POA: Insufficient documentation

## 2018-05-29 DIAGNOSIS — I2510 Atherosclerotic heart disease of native coronary artery without angina pectoris: Secondary | ICD-10-CM | POA: Insufficient documentation

## 2018-05-29 DIAGNOSIS — L97519 Non-pressure chronic ulcer of other part of right foot with unspecified severity: Secondary | ICD-10-CM | POA: Diagnosis not present

## 2018-05-29 DIAGNOSIS — E785 Hyperlipidemia, unspecified: Secondary | ICD-10-CM | POA: Diagnosis not present

## 2018-05-29 DIAGNOSIS — Z955 Presence of coronary angioplasty implant and graft: Secondary | ICD-10-CM | POA: Insufficient documentation

## 2018-05-29 DIAGNOSIS — Z6827 Body mass index (BMI) 27.0-27.9, adult: Secondary | ICD-10-CM | POA: Insufficient documentation

## 2018-05-29 DIAGNOSIS — Z91040 Latex allergy status: Secondary | ICD-10-CM | POA: Diagnosis not present

## 2018-05-29 DIAGNOSIS — I998 Other disorder of circulatory system: Secondary | ICD-10-CM | POA: Diagnosis present

## 2018-05-29 DIAGNOSIS — N186 End stage renal disease: Secondary | ICD-10-CM | POA: Insufficient documentation

## 2018-05-29 DIAGNOSIS — I739 Peripheral vascular disease, unspecified: Secondary | ICD-10-CM

## 2018-05-29 DIAGNOSIS — I120 Hypertensive chronic kidney disease with stage 5 chronic kidney disease or end stage renal disease: Secondary | ICD-10-CM | POA: Insufficient documentation

## 2018-05-29 DIAGNOSIS — E1151 Type 2 diabetes mellitus with diabetic peripheral angiopathy without gangrene: Secondary | ICD-10-CM | POA: Insufficient documentation

## 2018-05-29 DIAGNOSIS — E1122 Type 2 diabetes mellitus with diabetic chronic kidney disease: Secondary | ICD-10-CM | POA: Insufficient documentation

## 2018-05-29 DIAGNOSIS — I70235 Atherosclerosis of native arteries of right leg with ulceration of other part of foot: Secondary | ICD-10-CM | POA: Diagnosis not present

## 2018-05-29 DIAGNOSIS — Z952 Presence of prosthetic heart valve: Secondary | ICD-10-CM | POA: Diagnosis not present

## 2018-05-29 DIAGNOSIS — E11621 Type 2 diabetes mellitus with foot ulcer: Secondary | ICD-10-CM | POA: Diagnosis not present

## 2018-05-29 DIAGNOSIS — Z992 Dependence on renal dialysis: Secondary | ICD-10-CM | POA: Insufficient documentation

## 2018-05-29 DIAGNOSIS — E663 Overweight: Secondary | ICD-10-CM | POA: Diagnosis not present

## 2018-05-29 DIAGNOSIS — I70229 Atherosclerosis of native arteries of extremities with rest pain, unspecified extremity: Secondary | ICD-10-CM | POA: Diagnosis present

## 2018-05-29 HISTORY — PX: ABDOMINAL AORTOGRAM W/LOWER EXTREMITY: CATH118223

## 2018-05-29 LAB — GLUCOSE, CAPILLARY
GLUCOSE-CAPILLARY: 164 mg/dL — AB (ref 70–99)
Glucose-Capillary: 84 mg/dL (ref 70–99)
Glucose-Capillary: 67 mg/dL — ABNORMAL LOW (ref 70–99)
Glucose-Capillary: 88 mg/dL (ref 70–99)

## 2018-05-29 LAB — POTASSIUM: POTASSIUM: 5.3 mmol/L — AB (ref 3.5–5.1)

## 2018-05-29 SURGERY — ABDOMINAL AORTOGRAM W/LOWER EXTREMITY
Anesthesia: LOCAL

## 2018-05-29 MED ORDER — SODIUM CHLORIDE 0.9 % WEIGHT BASED INFUSION
3.0000 mL/kg/h | INTRAVENOUS | Status: AC
  Administered 2018-05-29: 3 mL/kg/h via INTRAVENOUS

## 2018-05-29 MED ORDER — FENTANYL CITRATE (PF) 100 MCG/2ML IJ SOLN
INTRAMUSCULAR | Status: AC
  Filled 2018-05-29: qty 2

## 2018-05-29 MED ORDER — HEPARIN (PORCINE) IN NACL 1000-0.9 UT/500ML-% IV SOLN
INTRAVENOUS | Status: DC | PRN
  Administered 2018-05-29 (×2): 500 mL

## 2018-05-29 MED ORDER — IODIXANOL 320 MG/ML IV SOLN
INTRAVENOUS | Status: DC | PRN
  Administered 2018-05-29: 196 mL via INTRA_ARTERIAL

## 2018-05-29 MED ORDER — SODIUM CHLORIDE 0.9% FLUSH: 3.0000 mL | Freq: Two times a day (BID) | INTRAVENOUS | Status: DC

## 2018-05-29 MED ORDER — MIDAZOLAM HCL 2 MG/2ML IJ SOLN
INTRAMUSCULAR | Status: DC | PRN
  Administered 2018-05-29: 1 mg via INTRAVENOUS

## 2018-05-29 MED ORDER — SODIUM CHLORIDE 0.9 % WEIGHT BASED INFUSION: 1.0000 mL/kg/h | INTRAVENOUS | Status: DC

## 2018-05-29 MED ORDER — LABETALOL HCL 5 MG/ML IV SOLN
INTRAVENOUS | Status: AC
  Filled 2018-05-29: qty 4

## 2018-05-29 MED ORDER — SODIUM CHLORIDE 0.9% FLUSH: 3.0000 mL | INTRAVENOUS | Status: DC | PRN

## 2018-05-29 MED ORDER — ASPIRIN EC 81 MG PO TBEC: 81.0000 mg | DELAYED_RELEASE_TABLET | Freq: Every day | ORAL | Status: DC

## 2018-05-29 MED ORDER — ONDANSETRON HCL 4 MG/2ML IJ SOLN: 4.0000 mg | Freq: Four times a day (QID) | INTRAMUSCULAR | Status: DC | PRN

## 2018-05-29 MED ORDER — ATORVASTATIN CALCIUM 20 MG PO TABS: 20.0000 mg | ORAL_TABLET | Freq: Every day | ORAL | Status: DC

## 2018-05-29 MED ORDER — SODIUM CHLORIDE 0.9 % IV SOLN: INTRAVENOUS | Status: AC

## 2018-05-29 MED ORDER — HEPARIN (PORCINE) IN NACL 1000-0.9 UT/500ML-% IV SOLN
INTRAVENOUS | Status: AC
  Filled 2018-05-29: qty 1000

## 2018-05-29 MED ORDER — LABETALOL HCL 5 MG/ML IV SOLN
10.0000 mg | INTRAVENOUS | Status: DC | PRN
  Administered 2018-05-29: 10 mg via INTRAVENOUS

## 2018-05-29 MED ORDER — MIDAZOLAM HCL 2 MG/2ML IJ SOLN
INTRAMUSCULAR | Status: AC
  Filled 2018-05-29: qty 2

## 2018-05-29 MED ORDER — SODIUM CHLORIDE 0.9 % IV SOLN: 250.0000 mL | INTRAVENOUS | Status: DC | PRN

## 2018-05-29 MED ORDER — ACETAMINOPHEN 325 MG PO TABS: 650.0000 mg | ORAL_TABLET | ORAL | Status: DC | PRN

## 2018-05-29 MED ORDER — LIDOCAINE HCL (PF) 1 % IJ SOLN
INTRAMUSCULAR | Status: DC | PRN
  Administered 2018-05-29: 15 mL via INTRADERMAL

## 2018-05-29 MED ORDER — LIDOCAINE HCL (PF) 1 % IJ SOLN
INTRAMUSCULAR | Status: AC
  Filled 2018-05-29: qty 30

## 2018-05-29 MED ORDER — ASPIRIN 81 MG PO CHEW
81.0000 mg | CHEWABLE_TABLET | ORAL | Status: AC
  Administered 2018-05-29: 81 mg via ORAL
  Filled 2018-05-29: qty 1

## 2018-05-29 MED ORDER — HYDRALAZINE HCL 20 MG/ML IJ SOLN: 5.0000 mg | INTRAMUSCULAR | Status: DC | PRN

## 2018-05-29 MED ORDER — MORPHINE SULFATE (PF) 10 MG/ML IV SOLN: 2.0000 mg | INTRAVENOUS | Status: DC | PRN

## 2018-05-29 MED ORDER — FENTANYL CITRATE (PF) 100 MCG/2ML IJ SOLN
INTRAMUSCULAR | Status: DC | PRN
  Administered 2018-05-29: 25 ug via INTRAVENOUS

## 2018-05-29 SURGICAL SUPPLY — 13 items
CATH ANGIO 5F PIGTAIL 65CM (CATHETERS) ×4 IMPLANT
CATH CROSS OVER TEMPO 5F (CATHETERS) ×2 IMPLANT
CATH STRAIGHT 5FR 65CM (CATHETERS) ×2 IMPLANT
KIT MICROPUNCTURE NIT STIFF (SHEATH) IMPLANT
KIT PV (KITS) ×2 IMPLANT
SHEATH PINNACLE 5F 10CM (SHEATH) ×2 IMPLANT
STOPCOCK MORSE 400PSI 3WAY (MISCELLANEOUS) ×2 IMPLANT
SYRINGE MEDRAD AVANTA MACH 7 (SYRINGE) ×2 IMPLANT
TRANSDUCER W/STOPCOCK (MISCELLANEOUS) ×2 IMPLANT
TRAY PV CATH (CUSTOM PROCEDURE TRAY) ×2 IMPLANT
TUBING CIL FLEX 10 FLL-RA (TUBING) ×2 IMPLANT
WIRE HI TORQ VERSACORE J 260CM (WIRE) IMPLANT
WIRE HITORQ VERSACORE ST 145CM (WIRE) ×2 IMPLANT

## 2018-05-29 NOTE — Discharge Instructions (Signed)
**Note -identified via Obfuscation** Femoral Site Care °Refer to this sheet in the next few weeks. These instructions provide you with information about caring for yourself after your procedure. Your health care provider may also give you more specific instructions. Your treatment has been planned according to current medical practices, but problems sometimes occur. Call your health care provider if you have any problems or questions after your procedure. °What can I expect after the procedure? °After your procedure, it is typical to have the following: °· Bruising at the site that usually fades within 1-2 weeks. °· Blood collecting in the tissue (hematoma) that may be painful to the touch. It should usually decrease in size and tenderness within 1-2 weeks. ° °Follow these instructions at home: °· Take medicines only as directed by your health care provider. °· You may shower 24-48 hours after the procedure or as directed by your health care provider. Remove the bandage (dressing) and gently wash the site with plain soap and water. Pat the area dry with a clean towel. Do not rub the site, because this may cause bleeding. °· Do not take baths, swim, or use a hot tub until your health care provider approves. °· Check your insertion site every day for redness, swelling, or drainage. °· Do not apply powder or lotion to the site. °· Limit use of stairs to twice a day for the first 2-3 days or as directed by your health care provider. °· Do not squat for the first 2-3 days or as directed by your health care provider. °· Do not lift over 10 lb (4.5 kg) for 5 days after your procedure or as directed by your health care provider. °· Ask your health care provider when it is okay to: °? Return to work or school. °? Resume usual physical activities or sports. °? Resume sexual activity. °· Do not drive home if you are discharged the same day as the procedure. Have someone else drive you. °· You may drive 24 hours after the procedure unless otherwise instructed by  your health care provider. °· Do not operate machinery or power tools for 24 hours after the procedure or as directed by your health care provider. °· If your procedure was done as an outpatient procedure, which means that you went home the same day as your procedure, a responsible adult should be with you for the first 24 hours after you arrive home. °· Keep all follow-up visits as directed by your health care provider. This is important. °Contact a health care provider if: °· You have a fever. °· You have chills. °· You have increased bleeding from the site. Hold pressure on the site. °Get help right away if: °· You have unusual pain at the site. °· You have redness, warmth, or swelling at the site. °· You have drainage (other than a small amount of blood on the dressing) from the site. °· The site is bleeding, and the bleeding does not stop after 30 minutes of holding steady pressure on the site. °· Your leg or foot becomes pale, cool, tingly, or numb. °This information is not intended to replace advice given to you by your health care provider. Make sure you discuss any questions you have with your health care provider. °Document Released: 05/28/2014 Document Revised: 03/01/2016 Document Reviewed: 04/13/2014 °Elsevier Interactive Patient Education © 2018 [employer] ° °

## 2018-05-29 NOTE — Interval H&P Note (Signed)
History and Physical Interval Note:  05/29/2018 8:34 AM  [name] [name]  has presented today for surgery, with the diagnosis of pad  The various methods of treatment have been discussed with the patient and family. After consideration of risks, benefits and other options for treatment, the patient has consented to  Procedure(s): ABDOMINAL AORTOGRAM W/LOWER EXTREMITY (N/A) as a surgical intervention .  The patient's history has been reviewed, patient examined, no change in status, stable for surgery.  I have reviewed the patient's chart and labs.  Questions were answered to the patient's satisfaction.     Quay Burow

## 2018-05-29 NOTE — Progress Notes (Signed)
Site area: left groin fa sheath Site Prior to Removal:  Level 0 Pressure Applied For: 20 minutes Manual:   yes Patient Status During Pull:  stable Post Pull Site:  Level 0 Post Pull Instructions Given:  yes Post Pull Pulses Present: left anterior tibial dopplered Dressing Applied:  Gauze and tegaderm Bedrest begins @ 1005 Comments:

## 2018-05-30 DIAGNOSIS — N186 End stage renal disease: Secondary | ICD-10-CM | POA: Diagnosis not present

## 2018-05-30 DIAGNOSIS — N2581 Secondary hyperparathyroidism of renal origin: Secondary | ICD-10-CM | POA: Diagnosis not present

## 2018-05-30 DIAGNOSIS — E1129 Type 2 diabetes mellitus with other diabetic kidney complication: Secondary | ICD-10-CM | POA: Diagnosis not present

## 2018-05-30 DIAGNOSIS — D631 Anemia in chronic kidney disease: Secondary | ICD-10-CM | POA: Diagnosis not present

## 2018-06-02 DIAGNOSIS — N2581 Secondary hyperparathyroidism of renal origin: Secondary | ICD-10-CM | POA: Diagnosis not present

## 2018-06-02 DIAGNOSIS — E1129 Type 2 diabetes mellitus with other diabetic kidney complication: Secondary | ICD-10-CM | POA: Diagnosis not present

## 2018-06-02 DIAGNOSIS — N186 End stage renal disease: Secondary | ICD-10-CM | POA: Diagnosis not present

## 2018-06-02 DIAGNOSIS — D631 Anemia in chronic kidney disease: Secondary | ICD-10-CM | POA: Diagnosis not present

## 2018-06-03 ENCOUNTER — Other Ambulatory Visit (HOSPITAL_COMMUNITY): Payer: Medicare Other

## 2018-06-04 DIAGNOSIS — N2581 Secondary hyperparathyroidism of renal origin: Secondary | ICD-10-CM | POA: Diagnosis not present

## 2018-06-04 DIAGNOSIS — D631 Anemia in chronic kidney disease: Secondary | ICD-10-CM | POA: Diagnosis not present

## 2018-06-04 DIAGNOSIS — E1129 Type 2 diabetes mellitus with other diabetic kidney complication: Secondary | ICD-10-CM | POA: Diagnosis not present

## 2018-06-04 DIAGNOSIS — N186 End stage renal disease: Secondary | ICD-10-CM | POA: Diagnosis not present

## 2018-06-05 ENCOUNTER — Inpatient Hospital Stay (HOSPITAL_COMMUNITY): Admission: RE | Admit: 2018-06-05 | Payer: Medicare Other | Source: Ambulatory Visit

## 2018-06-05 ENCOUNTER — Encounter (HOSPITAL_COMMUNITY): Payer: Medicare Other

## 2018-06-06 ENCOUNTER — Observation Stay (HOSPITAL_COMMUNITY)
Admission: EM | Admit: 2018-06-06 | Discharge: 2018-06-07 | Disposition: A | Discharge status: Home / Self Care | Payer: Medicare Other | Attending: Internal Medicine | Admitting: Internal Medicine

## 2018-06-06 ENCOUNTER — Encounter (HOSPITAL_COMMUNITY): Payer: Self-pay | Admitting: Internal Medicine

## 2018-06-06 ENCOUNTER — Observation Stay (HOSPITAL_COMMUNITY): Payer: Medicare Other

## 2018-06-06 ENCOUNTER — Emergency Department (HOSPITAL_COMMUNITY): Payer: Medicare Other

## 2018-06-06 DIAGNOSIS — R072 Precordial pain: Secondary | ICD-10-CM | POA: Diagnosis not present

## 2018-06-06 DIAGNOSIS — I700 Atherosclerosis of aorta: Secondary | ICD-10-CM | POA: Diagnosis not present

## 2018-06-06 DIAGNOSIS — I272 Pulmonary hypertension, unspecified: Secondary | ICD-10-CM | POA: Insufficient documentation

## 2018-06-06 DIAGNOSIS — R0789 Other chest pain: Secondary | ICD-10-CM | POA: Diagnosis not present

## 2018-06-06 DIAGNOSIS — Z79899 Other long term (current) drug therapy: Secondary | ICD-10-CM | POA: Insufficient documentation

## 2018-06-06 DIAGNOSIS — I5032 Chronic diastolic (congestive) heart failure: Secondary | ICD-10-CM | POA: Insufficient documentation

## 2018-06-06 DIAGNOSIS — R112 Nausea with vomiting, unspecified: Secondary | ICD-10-CM | POA: Diagnosis not present

## 2018-06-06 DIAGNOSIS — N186 End stage renal disease: Secondary | ICD-10-CM

## 2018-06-06 DIAGNOSIS — J9811 Atelectasis: Secondary | ICD-10-CM | POA: Diagnosis not present

## 2018-06-06 DIAGNOSIS — R079 Chest pain, unspecified: Secondary | ICD-10-CM | POA: Diagnosis not present

## 2018-06-06 DIAGNOSIS — E161 Other hypoglycemia: Secondary | ICD-10-CM | POA: Diagnosis not present

## 2018-06-06 DIAGNOSIS — R0602 Shortness of breath: Secondary | ICD-10-CM

## 2018-06-06 DIAGNOSIS — Z992 Dependence on renal dialysis: Secondary | ICD-10-CM | POA: Insufficient documentation

## 2018-06-06 DIAGNOSIS — I340 Nonrheumatic mitral (valve) insufficiency: Secondary | ICD-10-CM | POA: Diagnosis not present

## 2018-06-06 DIAGNOSIS — I209 Angina pectoris, unspecified: Secondary | ICD-10-CM

## 2018-06-06 DIAGNOSIS — E11319 Type 2 diabetes mellitus with unspecified diabetic retinopathy without macular edema: Secondary | ICD-10-CM | POA: Insufficient documentation

## 2018-06-06 DIAGNOSIS — E785 Hyperlipidemia, unspecified: Secondary | ICD-10-CM | POA: Diagnosis not present

## 2018-06-06 DIAGNOSIS — N281 Cyst of kidney, acquired: Secondary | ICD-10-CM | POA: Diagnosis not present

## 2018-06-06 DIAGNOSIS — J479 Bronchiectasis, uncomplicated: Secondary | ICD-10-CM | POA: Insufficient documentation

## 2018-06-06 DIAGNOSIS — I4581 Long QT syndrome: Secondary | ICD-10-CM | POA: Diagnosis not present

## 2018-06-06 DIAGNOSIS — J90 Pleural effusion, not elsewhere classified: Secondary | ICD-10-CM | POA: Diagnosis not present

## 2018-06-06 DIAGNOSIS — E1129 Type 2 diabetes mellitus with other diabetic kidney complication: Secondary | ICD-10-CM | POA: Diagnosis not present

## 2018-06-06 DIAGNOSIS — Z952 Presence of prosthetic heart valve: Secondary | ICD-10-CM | POA: Insufficient documentation

## 2018-06-06 DIAGNOSIS — G2581 Restless legs syndrome: Secondary | ICD-10-CM | POA: Diagnosis not present

## 2018-06-06 DIAGNOSIS — E1122 Type 2 diabetes mellitus with diabetic chronic kidney disease: Secondary | ICD-10-CM | POA: Diagnosis not present

## 2018-06-06 DIAGNOSIS — Z9981 Dependence on supplemental oxygen: Secondary | ICD-10-CM | POA: Insufficient documentation

## 2018-06-06 DIAGNOSIS — E1140 Type 2 diabetes mellitus with diabetic neuropathy, unspecified: Secondary | ICD-10-CM | POA: Diagnosis not present

## 2018-06-06 DIAGNOSIS — K219 Gastro-esophageal reflux disease without esophagitis: Secondary | ICD-10-CM | POA: Diagnosis not present

## 2018-06-06 DIAGNOSIS — Z7982 Long term (current) use of aspirin: Secondary | ICD-10-CM | POA: Insufficient documentation

## 2018-06-06 DIAGNOSIS — Z91040 Latex allergy status: Secondary | ICD-10-CM | POA: Insufficient documentation

## 2018-06-06 DIAGNOSIS — D631 Anemia in chronic kidney disease: Secondary | ICD-10-CM | POA: Diagnosis not present

## 2018-06-06 DIAGNOSIS — E162 Hypoglycemia, unspecified: Secondary | ICD-10-CM | POA: Diagnosis not present

## 2018-06-06 DIAGNOSIS — Z888 Allergy status to other drugs, medicaments and biological substances status: Secondary | ICD-10-CM | POA: Insufficient documentation

## 2018-06-06 DIAGNOSIS — I2510 Atherosclerotic heart disease of native coronary artery without angina pectoris: Secondary | ICD-10-CM | POA: Diagnosis not present

## 2018-06-06 DIAGNOSIS — Z955 Presence of coronary angioplasty implant and graft: Secondary | ICD-10-CM | POA: Insufficient documentation

## 2018-06-06 DIAGNOSIS — F329 Major depressive disorder, single episode, unspecified: Secondary | ICD-10-CM | POA: Insufficient documentation

## 2018-06-06 DIAGNOSIS — E7800 Pure hypercholesterolemia, unspecified: Secondary | ICD-10-CM | POA: Diagnosis not present

## 2018-06-06 DIAGNOSIS — Z8611 Personal history of tuberculosis: Secondary | ICD-10-CM | POA: Insufficient documentation

## 2018-06-06 DIAGNOSIS — I132 Hypertensive heart and chronic kidney disease with heart failure and with stage 5 chronic kidney disease, or end stage renal disease: Secondary | ICD-10-CM | POA: Insufficient documentation

## 2018-06-06 DIAGNOSIS — N2581 Secondary hyperparathyroidism of renal origin: Secondary | ICD-10-CM | POA: Diagnosis not present

## 2018-06-06 DIAGNOSIS — Z8673 Personal history of transient ischemic attack (TIA), and cerebral infarction without residual deficits: Secondary | ICD-10-CM | POA: Insufficient documentation

## 2018-06-06 DIAGNOSIS — Z951 Presence of aortocoronary bypass graft: Secondary | ICD-10-CM | POA: Insufficient documentation

## 2018-06-06 DIAGNOSIS — F17210 Nicotine dependence, cigarettes, uncomplicated: Secondary | ICD-10-CM | POA: Diagnosis not present

## 2018-06-06 HISTORY — DX: Shortness of breath: R06.02

## 2018-06-06 LAB — BASIC METABOLIC PANEL
ANION GAP: 13 (ref 5–15)
BUN: 19 mg/dL (ref 6–20)
CALCIUM: 8.3 mg/dL — AB (ref 8.9–10.3)
CO2: 31 mmol/L (ref 22–32)
Chloride: 92 mmol/L — ABNORMAL LOW (ref 98–111)
Creatinine, Ser: 6.93 mg/dL — ABNORMAL HIGH (ref 0.61–1.24)
GFR, EST AFRICAN AMERICAN: 9 mL/min — AB (ref >60)
GFR, EST NON AFRICAN AMERICAN: 8 mL/min — AB (ref >60)
Glucose, Bld: 82 mg/dL (ref 70–99)
Potassium: 4.8 mmol/L (ref 3.5–5.1)
Sodium: 136 mmol/L (ref 135–145)

## 2018-06-06 LAB — I-STAT CHEM 8, ED
BUN: 20 mg/dL (ref 6–20)
Calcium, Ion: 0.89 mmol/L — CL (ref 1.15–1.40)
Chloride: 92 mmol/L — ABNORMAL LOW (ref 98–111)
Creatinine, Ser: 7.6 mg/dL — ABNORMAL HIGH (ref 0.61–1.24)
Glucose, Bld: 81 mg/dL (ref 70–99)
HCT: 41 % (ref 39.0–52.0)
HEMOGLOBIN: 13.9 g/dL (ref 13.0–17.0)
POTASSIUM: 4.7 mmol/L (ref 3.5–5.1)
SODIUM: 135 mmol/L (ref 135–145)
TCO2: 32 mmol/L (ref 22–32)

## 2018-06-06 LAB — CBC
HEMATOCRIT: 40.2 % (ref 39.0–52.0)
HEMOGLOBIN: 12.3 g/dL — AB (ref 13.0–17.0)
MCH: 25.3 pg — ABNORMAL LOW (ref 26.0–34.0)
MCHC: 30.6 g/dL (ref 30.0–36.0)
MCV: 82.7 fL (ref 78.0–100.0)
Platelets: 151 10*3/uL (ref 150–400)
RBC: 4.86 MIL/uL (ref 4.22–5.81)
RDW: 19.3 % — ABNORMAL HIGH (ref 11.5–15.5)
WBC: 5.6 10*3/uL (ref 4.0–10.5)

## 2018-06-06 LAB — I-STAT TROPONIN, ED: Troponin i, poc: 0.02 ng/mL (ref 0.00–0.08)

## 2018-06-06 LAB — BRAIN NATRIURETIC PEPTIDE: B NATRIURETIC PEPTIDE 5: 385.5 pg/mL — AB (ref 0.0–100.0)

## 2018-06-06 MED ORDER — IPRATROPIUM BROMIDE 0.02 % IN SOLN
0.5000 mg | Freq: Once | RESPIRATORY_TRACT | Status: AC
  Administered 2018-06-06: 0.5 mg via RESPIRATORY_TRACT
  Filled 2018-06-06: qty 2.5

## 2018-06-06 MED ORDER — ONDANSETRON HCL 4 MG PO TABS: 4.0000 mg | ORAL_TABLET | Freq: Four times a day (QID) | ORAL | Status: DC | PRN

## 2018-06-06 MED ORDER — ACETAMINOPHEN 325 MG PO TABS
650.0000 mg | ORAL_TABLET | Freq: Four times a day (QID) | ORAL | Status: DC | PRN
  Administered 2018-06-07: 650 mg via ORAL
  Filled 2018-06-06: qty 2

## 2018-06-06 MED ORDER — IOPAMIDOL (ISOVUE-300) INJECTION 61%
INTRAVENOUS | Status: AC
  Filled 2018-06-06: qty 100

## 2018-06-06 MED ORDER — ISOSORBIDE MONONITRATE ER 30 MG PO TB24
30.0000 mg | ORAL_TABLET | Freq: Every day | ORAL | Status: DC
  Administered 2018-06-07: 30 mg via ORAL
  Filled 2018-06-06: qty 1

## 2018-06-06 MED ORDER — ASPIRIN EC 81 MG PO TBEC
81.0000 mg | DELAYED_RELEASE_TABLET | Freq: Every day | ORAL | Status: DC
  Administered 2018-06-07: 81 mg via ORAL
  Filled 2018-06-06: qty 1

## 2018-06-06 MED ORDER — AMLODIPINE BESYLATE 10 MG PO TABS
10.0000 mg | ORAL_TABLET | Freq: Every day | ORAL | Status: DC
  Administered 2018-06-07: 10 mg via ORAL
  Filled 2018-06-06: qty 1

## 2018-06-06 MED ORDER — RAMELTEON 8 MG PO TABS
8.0000 mg | ORAL_TABLET | Freq: Every day | ORAL | Status: DC
  Administered 2018-06-07: 8 mg via ORAL
  Filled 2018-06-06: qty 1

## 2018-06-06 MED ORDER — NITROGLYCERIN 0.4 MG SL SUBL: 0.4000 mg | SUBLINGUAL_TABLET | SUBLINGUAL | Status: DC | PRN

## 2018-06-06 MED ORDER — SODIUM CHLORIDE 0.9% FLUSH
3.0000 mL | Freq: Two times a day (BID) | INTRAVENOUS | Status: DC
  Administered 2018-06-07 (×2): 3 mL via INTRAVENOUS

## 2018-06-06 MED ORDER — ALBUTEROL SULFATE (2.5 MG/3ML) 0.083% IN NEBU
5.0000 mg | INHALATION_SOLUTION | Freq: Once | RESPIRATORY_TRACT | Status: AC
  Administered 2018-06-06: 5 mg via RESPIRATORY_TRACT
  Filled 2018-06-06: qty 6

## 2018-06-06 MED ORDER — HEPARIN SODIUM (PORCINE) 5000 UNIT/ML IJ SOLN
5000.0000 [IU] | Freq: Three times a day (TID) | INTRAMUSCULAR | Status: DC
  Administered 2018-06-07 (×2): 5000 [IU] via SUBCUTANEOUS
  Filled 2018-06-06 (×2): qty 1

## 2018-06-06 MED ORDER — ROPINIROLE HCL 0.25 MG PO TABS
0.2500 mg | ORAL_TABLET | Freq: Every day | ORAL | Status: DC
  Administered 2018-06-07: 0.25 mg via ORAL
  Filled 2018-06-06: qty 1

## 2018-06-06 MED ORDER — RENA-VITE PO TABS
1.0000 | ORAL_TABLET | Freq: Every day | ORAL | Status: DC
  Administered 2018-06-07: 1 via ORAL
  Filled 2018-06-06: qty 1

## 2018-06-06 MED ORDER — POLYETHYLENE GLYCOL 3350 17 G PO PACK: 17.0000 g | PACK | Freq: Every day | ORAL | Status: DC | PRN

## 2018-06-06 MED ORDER — IOPAMIDOL (ISOVUE-300) INJECTION 61%
75.0000 mL | Freq: Once | INTRAVENOUS | Status: AC | PRN
  Administered 2018-06-06: 75 mL via INTRAVENOUS

## 2018-06-06 MED ORDER — ONDANSETRON HCL 4 MG/2ML IJ SOLN: 4.0000 mg | Freq: Four times a day (QID) | INTRAMUSCULAR | Status: DC | PRN

## 2018-06-06 MED ORDER — SEVELAMER CARBONATE 800 MG PO TABS
3200.0000 mg | ORAL_TABLET | Freq: Three times a day (TID) | ORAL | Status: DC
  Administered 2018-06-07 (×2): 3200 mg via ORAL
  Filled 2018-06-06 (×2): qty 4

## 2018-06-06 MED ORDER — ATORVASTATIN CALCIUM 20 MG PO TABS
20.0000 mg | ORAL_TABLET | Freq: Every day | ORAL | Status: DC
  Administered 2018-06-07: 20 mg via ORAL
  Filled 2018-06-06: qty 1

## 2018-06-06 MED ORDER — ACETAMINOPHEN 650 MG RE SUPP: 650.0000 mg | Freq: Four times a day (QID) | RECTAL | Status: DC | PRN

## 2018-06-06 NOTE — ED Provider Notes (Signed)
[facility] EMERGENCY DEPARTMENT Provider Note   CSN: [id number] Arrival date & time: 06/06/18  1724     History   Chief Complaint Chief Complaint  Patient presents with  . Chest Pain  . Shortness of Breath    HPI Colt Martelle is a 62 y.o. male.  The history is provided by the patient, the EMS personnel and medical records. No language interpreter was used.     62 year old male with history of hypertension, diabetes, hyperlipidemia, multiple stents with coronary bypass grafting and valvular replacement currently on hemodialysis brought here via EMS from dialysis center for evaluation of chest pain shortness of breath.  Patient report yesterday he experiencing some sharp central chest pain that happened intermittently and resolved.  Today after he finished with his full course of dialysis and went home he then developed acute onset of sharp pain in his mid chest nonradiating with associated shortness of breath.  He also felt nauseous, vomiting and felt weak.  Shortness of breath is new.  EMS was contacted and it was noted that patient's initial O2 sats were low at that time, patient was placed on nasal cannula and was giving nitro which alleviated his chest pain from a 3 out of 10 to no pain.  States nausea has also resolved.  He endorsed mild shortness of breath at this time.  He denies any significant fever, chills, productive cough, hemoptysis, coffee-ground emesis, dysuria, change in bowel habit.  He denies any prior history of PE DVT, no recent surgery, prolonged bedrest, active cancer.    Past Medical History:  Diagnosis Date  . Anemia   . Anginal pain ([place])   . CAD (coronary artery disease)    a. per [facility] s/p 3.65mm x 80mm Vision BMS to mid LAD 12/2009 and Xience DES to mid LAD 10/2010.  [name] Chronic diastolic CHF (congestive heart failure) ([place])   . Colon polyps   . Daily headache   . ESRD on dialysis [facility]) since ~ 2008   "MWF; [facility]"  (03/04/2017)  . GERD (gastroesophageal reflux disease)   . H/O TIA (transient ischemic attack) 04/01/2015  . H/O TIA (transient ischemic attack) 04/01/2015  . Heart murmur   . Hematochezia    a. 2014: colonscopy, which showed moderately-sized internal hemorrhoids, two 44mm polyps in transverse colon and ascending colon that were resected, five 2-59mm polyps in sigmoid colon, descending colon, transverse colon, and ascending colon that were resected. An upper endoscopy was performed and showed normal esophagus, stomach, and duodenum.  . Hematuria    a. H/o hematuria 2014 with cystoscopy that was unrevealing for his source of hematuria. He underwent a kidney ultrasound on 10/14 that showed mildly echogenic and scarred kidneys compatible with medical renal disease, without hydronephrosis or renal calculi.  [name] History of blood transfusion    "had colonoscopy done; they had to give me some blood"  . History of kidney stones   . Hyperlipidemia   . Hypertension   . On home oxygen therapy    "2L prn" (07/21/2015); "been off it for awhile" (03/04/2017)  . Pneumonia   . Renal insufficiency   . 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 Type II diabetes mellitus ([place])   . Wears dentures     Patient Active Problem List   Diagnosis Date Noted  . Critical lower limb ischemia 05/27/2018  . Hx of CABG 02/02/2018  . Hypervolemia associated with renal insufficiency 01/26/2018  .  Hyperkalemia 01/26/2018  . Restless leg syndrome 01/14/2018  . Anemia 01/14/2018  . Tobacco use 10/29/2017  . Mild cognitive impairment 06/13/2017  . Chest pain 06/07/2017  . Skin ulcer of toe of right foot, limited to breakdown of skin ([place])   . Callus of foot 03/07/2017  . Pulmonary hypertension ([place]) 07/28/2016  . Stroke-like symptoms 06/30/2016  . Peripheral neuropathy 05/07/2016  . S/P coronary artery stent placement 05/07/2016  . Nausea and vomiting 04/02/2016  . ESRD (end stage renal disease)  ([place]) 03/18/2016  . Mitral regurgitation   . Diabetic neuropathy ([place]) 07/29/2015  . Depression 07/22/2015  . GERD (gastroesophageal reflux disease) 06/04/2015  . Malnutrition of moderate degree ([place]) 05/17/2015  . Thrombocytopenia ([place]) 05/16/2015  . Weight loss 05/16/2015  . Diastolic dysfunction-grade 2 12/13/2014  . HCAP (healthcare-associated pneumonia) 10/01/2014  . 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 Arm pain, right 05/25/2014  . Numbness 05/25/2014  . Hypertension 04/27/2014  . CAD -S/P LAD BMS 2011, LAD DES 2012- patent cors Feb 2016 04/27/2014  . Diabetes mellitus type 2, diet-controlled ([place]) 04/27/2014  . Background diabetic retinopathy ([place]) 08/13/2012  . Primary localized osteoarthrosis, lower leg 04/15/2012  . Senile nuclear sclerosis 11/29/2010  . Vitreous hemorrhage ([place]) 11/29/2010  . Atherosclerotic heart disease of native coronary artery without angina pectoris 12/12/2009  . Hypercholesterolemia 11/30/2003    Past Surgical History:  Procedure Laterality Date  . ABDOMINAL AORTOGRAM W/LOWER EXTREMITY N/A 05/29/2018   Procedure: ABDOMINAL AORTOGRAM W/LOWER EXTREMITY;  Surgeon: Lorretta Harp, MD;  Location: [facility];  Service: Cardiovascular;  Laterality: N/A;  . AV FISTULA PLACEMENT Left ~ 2007   "upper arm"  . CARDIAC CATHETERIZATION  "several"  . CORONARY ANGIOPLASTY WITH STENT PLACEMENT  "several"  . CORONARY ARTERY BYPASS GRAFT     3 grafts  . CYSTOSCOPY W/ STONE MANIPULATION  X2?  . EYE SURGERY Bilateral    "laser OR for hemorrhage"  . LEFT HEART CATHETERIZATION WITH CORONARY ANGIOGRAM N/A 11/23/2014   Procedure: LEFT HEART CATHETERIZATION WITH CORONARY ANGIOGRAM;  Surgeon: Troy Sine, MD;  Location: [facility] CATH LAB;  Service: Cardiovascular;  Laterality: N/A;  . LITHOTRIPSY  X1  . MITRAL VALVE REPLACEMENT    . REVISON OF ARTERIOVENOUS FISTULA Left 38/75/6433   Procedure: PLICATION OF LEFT ARM ARTERIOVENOUS FISTULA;  Surgeon:  Angelia Mould, MD;  Location: [facility];  Service: Vascular;  Laterality: Left;  . REVISON OF ARTERIOVENOUS FISTULA Left 2/95/1884   Procedure: PLICATE ANEURYSM  OF LEFT ARTERIOVENOUS FISTULA;  Surgeon: Waynetta Sandy, MD;  Location: [facility];  Service: Vascular;  Laterality: Left;        Home Medications    Prior to Admission medications   Medication Sig Start Date End Date Taking? Authorizing Provider  amLODipine (NORVASC) 10 MG tablet Take 1 tablet (10 mg total) by mouth at bedtime. 04/17/18   Neva Seat, MD  aspirin EC 81 MG tablet Take 81 mg by mouth daily.    [provider]  atorvastatin (LIPITOR) 20 MG tablet Take 20 mg by mouth at bedtime.     [provider]  cadexomer iodine (IODOSORB) 0.9 % gel Apply 1 application topically daily as needed for wound care. Patient not taking: Reported on 05/27/2018 05/06/18   Landis Martins, DPM  cinacalcet (SENSIPAR) 90 MG tablet Take 180 mg by mouth every Monday, Wednesday, and Friday with hemodialysis.     [provider]  isosorbide mononitrate (IMDUR) 30 MG 24 hr tablet Take 1  tablet (30 mg total) by mouth daily. 10/29/17 10/29/18  Alphonzo Grieve, MD  multivitamin (RENA-VIT) TABS tablet Take 1 tablet by mouth at bedtime. 04/17/18   Neva Seat, MD  nitroGLYCERIN (NITROSTAT) 0.4 MG SL tablet Place 0.4 mg under the tongue every 5 (five) minutes as needed for chest pain.    [provider]  ondansetron (ZOFRAN) 4 MG tablet Take 1 tablet (4 mg total) by mouth 4 (four) times daily as needed for nausea or vomiting. Patient not taking: Reported on 05/28/2018 03/04/18   Wynona Luna, MD  rOPINIRole (REQUIP) 0.25 MG tablet Take 1 tablet (0.25 mg total) by mouth at bedtime. 02/25/18   Bartholomew Crews, MD  sevelamer carbonate (RENVELA) 800 MG tablet Take 4 tablets (3,200 mg total) by mouth 3 (three) times daily with meals. Patient taking differently: Take 1,600-3,200 mg by mouth See  admin instructions. Take 4 tablets (3200 mg) by mouth three times daily with meals and 2 tablets (1600 mg) with snacks 03/12/16   Eulogio Bear U, DO  traMADol (ULTRAM) 50 MG tablet Take 1 tablet (50 mg total) by mouth every 6 (six) hours as needed. Patient taking differently: Take 50 mg by mouth every 6 (six) hours as needed for moderate pain.  05/20/18   Landis Martins, DPM    Family History Family History  Problem Relation Age of Onset  . Bone cancer Mother   . Anuerysm Father   . Hypertension Unknown   . Diabetes type II Daughter     Social History Social History   Tobacco Use  . Smoking status: Current Some Day Smoker    Packs/day: 0.30    Years: 8.00    Pack years: 2.40    Types: Cigars  . Smokeless tobacco: Never Used  Substance Use Topics  . Alcohol use: No    Alcohol/week: 0.0 standard drinks  . Drug use: No     Allergies   Enalapril; Latex; and Tape   Review of Systems Review of Systems  All other systems reviewed and are negative.    Physical Exam Updated Vital Signs BP (!) 160/86 (BP Location: Right Arm)   Pulse 91   Temp 98.1 F (36.7 C) (Oral)   Resp 20   Ht 6' (1.829 m)   Wt 93.9 kg   SpO2 99%   BMI 28.07 kg/m   Physical Exam  Constitutional: He appears well-developed and well-nourished. No distress.  HENT:  Head: Atraumatic.  Eyes: Conjunctivae are normal.  Neck: Neck supple. No JVD present.  Cardiovascular: Normal rate, regular rhythm, intact distal pulses and normal pulses.  Murmur heard.  Systolic murmur is present. Pulmonary/Chest: Effort normal. He has decreased breath sounds. He has no wheezes. He has no rhonchi.  Abdominal: Soft. There is no tenderness.  Musculoskeletal:       Right lower leg: He exhibits no edema.       Left lower leg: He exhibits no edema.  Neurological: He is alert.  Skin: No rash noted.  Ulcer noted to the pad of right great toe  Psychiatric: He has a normal mood and affect.  Nursing note and vitals  reviewed.    ED Treatments / Results  Labs (all labs ordered are listed, but only abnormal results are displayed) Labs Reviewed  CBC - Abnormal; Notable for the following components:      Result Value   Hemoglobin 12.3 (*)    MCH 25.3 (*)    RDW 19.3 (*)    All other  components within normal limits  BRAIN NATRIURETIC PEPTIDE - Abnormal; Notable for the following components:   B Natriuretic Peptide 385.5 (*)    All other components within normal limits  BASIC METABOLIC PANEL - Abnormal; Notable for the following components:   Chloride 92 (*)    Creatinine, Ser 6.93 (*)    Calcium 8.3 (*)    GFR calc non Af Amer 8 (*)    GFR calc Af Amer 9 (*)    All other components within normal limits  I-STAT CHEM 8, ED - Abnormal; Notable for the following components:   Chloride 92 (*)    Creatinine, Ser 7.60 (*)    Calcium, Ion 0.89 (*)    All other components within normal limits  I-STAT TROPONIN, ED    EKG EKG Interpretation  Date/Time:  Friday June 06 2018 17:30:44 EDT Ventricular Rate:  91 PR Interval:    QRS Duration: 102 QT Interval:  419 QTC Calculation: 516 R Axis:   11 Text Interpretation:  Sinus rhythm Probable left atrial enlargement Abnormal R-wave progression, late transition Left ventricular hypertrophy Prolonged QT interval No STEMI.  Confirmed by Nanda Quinton [phone]) on 06/06/2018 6:17:05 PM   Radiology Dg Chest 2 View  Result Date: 06/06/2018 CLINICAL DATA:  Acute onset chest pain and shortness of breath. EXAM: CHEST - 2 VIEW COMPARISON:  Chest x-ray dated January 26, 2018. FINDINGS: Stable cardiomegaly status post mitral valve replacement. Pulmonary vascular congestion. Increase in size of now moderate left pleural effusion with adjacent left lower lobe airspace disease. The right lung is clear. No pneumothorax. No acute osseous abnormality. IMPRESSION: 1. Increased now moderate left pleural effusion with adjacent left lower lobe atelectasis versus pneumonia.  Electronically Signed   By: Titus Dubin M.D.   On: 06/06/2018 19:05    Procedures Procedures (including critical care time)  Medications Ordered in ED Medications - No data to display   Initial Impression / Assessment and Plan / ED Course  I have reviewed the triage vital signs and the nursing notes.  Pertinent labs & imaging results that were available during my care of the patient were reviewed by me and considered in my medical decision making (see chart for details).     BP (!) 187/98   Pulse 91   Temp 98.1 F (36.7 C) (Oral)   Resp 18   Ht 6' (1.829 m)   Wt 93.9 kg   SpO2 94%   BMI 28.07 kg/m    Final Clinical Impressions(s) / ED Diagnoses   Final diagnoses:  Anginal pain (HCC)  Pleural effusion on left    ED Discharge Orders    None     6:20 PM Patient with significant cardiac history here with chest pain shortness of breath resolved after sublingual nitro.  He is currently chest pain-free.  Symptoms started after he finished his dialysis session earlier today.  He did complain some shortness of breath however no hypoxia noted and does not have any significant risk factor for PE aside from age.  I have low suspicion for PE causing symptoms at this time.  EKG unremarkable.  Would like to have patient admitted for cardiac rule out.  Care discussed with Dr. Laverta Baltimore.  9:15 PM Patient is a dialysis patient, evidence of elevated creatinine of 7.6 near baseline but normal potassium level.  BNP is 385.  No significant anemia.  Chest x-ray showing increased, moderate left pleural effusion with adjacent left lower lobe atelectasis versus pneumonia.  Patient denies having  cough or fever to suggest pneumonia.  He does have shortness of breath therefore he will need intervention in regard to his pleural effusion which was evident some prior chest x-ray but more prominent today.  He is chest pain-free but will need cardiac rule out.  Patient remains chest pain-free.  9:21  PM Appreciate consultation from Internal Medicine resident who agrees to see and admit pt for further care.    Domenic Moras, PA-C 06/06/18 2121    Margette Fast, MD 06/07/18 [phone]

## 2018-06-06 NOTE — ED Notes (Signed)
Pt ambulated, O2 sats maintained between 97-100%

## 2018-06-06 NOTE — H&P (Signed)
Date: 06/06/2018               Patient Name:  [name] [name] MRN: [id number]  DOB: [birth year] Age / Sex: 62 y.o., male   PCP: Neva Seat, MD         Medical Service: Internal Medicine Teaching Service         Attending Physician: Dr. Oval Linsey, MD    First Contact: Dr. Truman Hayward Pager: [phone]  Second Contact: Dr. Trilby Drummer Pager: [phone]       After Hours (After 5p/  First Contact Pager: [phone]  weekends / holidays): Second Contact Pager: [phone]   Chief Complaint: shortness of breath   History of Present Illness: Mr. Chaffin is a 62 y/o gentleman with PMHx of HTN, HLD, CAD s/p CABG in 12/2017, ESRD on HD who presents with acute onset of shortness of breath around 3:30 pm the night of admission. He was in his usual state of health and had returned home from dialysis feeling well. He suddenly began feeling "clammy," weak and short of breath. He had nausea and 2 episodes of vomiting which he states has been occurring intermittently for several months. He thought his symptoms would resolve after vomiting, but his symptoms persisted which prompted him to call EMS. He did not notice any worsening of symptoms with exertion; no palliating factors. Denies orthopnea, PND, f/c. He does have a chronic cough, but it is non-productive and has not worsened recently. Endorses intermittent sharp chest pain for approximately 1 month that radiates into bilateral chest wall. He denies any current chest pain.  He was receiving albuterol nebulizer during our encounter and felt it was providing some relief.   ROS negative for recent illness, change in appetite, numbness/tingling in extremities, lightheadedness, syncope, palpitations.    ED course: received 1 dose of nitroglycerin prior to arrival with no change in symptoms. Vitals on arrival were stable and saturating well on room air; 1st troponin negative; CBC without leukocytosis, stable hgb, BMP showed Na 136, K 4.8, Cl 92, bicarb 31, BUN 19, Crt  6.93. EKG without evidence of ischemia. CXR showed enlarging left pleural effusion compared to prior study in 01/2018.   Given significant cardiac history and symptomatic pleural effusion, IMTS was consulted for admission.   Meds:  Current Meds  Medication Sig  . amLODipine (NORVASC) 10 MG tablet Take 1 tablet (10 mg total) by mouth at bedtime.  [name] aspirin EC 81 MG tablet Take 81 mg by mouth daily.  [name] atorvastatin (LIPITOR) 20 MG tablet Take 20 mg by mouth at bedtime.   . cinacalcet (SENSIPAR) 90 MG tablet Take 180 mg by mouth every Monday, Wednesday, and Friday with hemodialysis.   [name] isosorbide mononitrate (IMDUR) 30 MG 24 hr tablet Take 1 tablet (30 mg total) by mouth daily. (Patient taking differently: Take 30 mg by mouth daily as needed (chest pain). )  . multivitamin (RENA-VIT) TABS tablet Take 1 tablet by mouth at bedtime.  . nitroGLYCERIN (NITROSTAT) 0.4 MG SL tablet Place 0.4 mg under the tongue every 5 (five) minutes as needed for chest pain.  [name] rOPINIRole (REQUIP) 0.25 MG tablet Take 1 tablet (0.25 mg total) by mouth at bedtime.  . sevelamer carbonate (RENVELA) 800 MG tablet Take 4 tablets (3,200 mg total) by mouth 3 (three) times daily with meals. (Patient taking differently: Take 1,600-3,200 mg by mouth See admin instructions. Take 4 tablets (3200 mg) by mouth three times daily with meals and 2 tablets (1600 mg) with snacks)  .  traMADol (ULTRAM) 50 MG tablet Take 1 tablet (50 mg total) by mouth every 6 (six) hours as needed. (Patient taking differently: Take 50 mg by mouth every 6 (six) hours as needed for moderate pain. )     Allergies: Allergies as of 06/06/2018 - Review Complete 06/06/2018  Allergen Reaction Noted  . Enalapril Hives and Rash 04/26/2014  . Latex Rash 09/23/2016  . Tape Rash and Other (See Comments) 09/23/2016   Past Medical History:  Diagnosis Date  . Anemia   . Anginal pain ([place])   . CAD (coronary artery disease)    a. per [facility] s/p 3.77mm x 19mm  Vision BMS to mid LAD 12/2009 and Xience DES to mid LAD 10/2010.  [name] Chronic diastolic CHF (congestive heart failure) ([place])   . Colon polyps   . Daily headache   . ESRD on dialysis [facility]) since ~ 2008   "MWF; [facility]" (03/04/2017)  . GERD (gastroesophageal reflux disease)   . H/O TIA (transient ischemic attack) 04/01/2015  . H/O TIA (transient ischemic attack) 04/01/2015  . Heart murmur   . Hematochezia    a. 2014: colonscopy, which showed moderately-sized internal hemorrhoids, two 1mm polyps in transverse colon and ascending colon that were resected, five 2-54mm polyps in sigmoid colon, descending colon, transverse colon, and ascending colon that were resected. An upper endoscopy was performed and showed normal esophagus, stomach, and duodenum.  . Hematuria    a. H/o hematuria 2014 with cystoscopy that was unrevealing for his source of hematuria. He underwent a kidney ultrasound on 10/14 that showed mildly echogenic and scarred kidneys compatible with medical renal disease, without hydronephrosis or renal calculi.  [name] History of blood transfusion    "had colonoscopy done; they had to give me some blood"  . History of kidney stones   . Hyperlipidemia   . Hypertension   . On home oxygen therapy    "2L prn" (07/21/2015); "been off it for awhile" (03/04/2017)  . Pneumonia   . Renal insufficiency   . 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 Type II diabetes mellitus ([place])   . Wears dentures     Family History: bone cancer (mother), aneurysm (father), HTN, DM (daughter) Social History: former cigarette smoker but quit in 12/2017 when he had CABG; currently smokes 1 black and mild per day; no EtOH or illicit drug use  Review of Systems: A complete ROS was negative except as per HPI.   Physical Exam: Blood pressure (!) 171/95, pulse 90, temperature 98.1 F (36.7 C), temperature source Oral, resp. rate (!) 22, height 6' (1.829 m), weight 90.2 kg, SpO2 96 %. General:  awake, alert, pleasant male sitting up in bed receiving nebulizer treatment. Appears comfortable.  HEENT: normocephalic, atraumatic. Conjunctiva clear; sclera non-icteric  Neck: supple. No JVD CV: RRR. SEM heard best at RUSB; no rubs or gallops. Distal pulses intact bilaterally Resp: no increased work of breathing. Speaking in full sentences. Lungs with diminished breath sounds at left base; no wheezes or rhonchi. Chest wall with TTP along sternotomy scar and 5th and 6th intercostal spaces bilaterally.  Abd: BS+; abd soft, non-tender, non-distended Neuro: A&Ox3; no focal deficits Ext: no lower extremity edema Skin: healing ulcer on pad of right great toe; no erythema or drainage.  Psych: appropriate mood and affect   EKG: personally reviewed my interpretation is sinus rhythm; likely left atrial enlargement, LVH; no ST segment changes.   CXR: personally reviewed my interpretation  is s/p CABG sternotomy wiring, enlarging left pleural effusion compared to previous study in April. S/p MVR.   Assessment & Plan by Problem: Active Problems:   SOB (shortness of breath) 1. Shortness of breath secondary to left pleural effusion.  Chest x-ray from 01/2018 with small left pleural effusion which has progressed on today's imaging. Was not symptomatic from effusion until today. He does not have any signs or symptoms of infection so less suspicious parapneumonic effusion. Care everywhere shows a CT chest done in April when he was admitted to [facility] for similar presentation. Scan showed small left pleural effusion, prominent lymph nodes throughout mediastinum and axilla felt to be reactive. Given persistent and progressive effusion, repeat CT ordered which showed mild paratracheal lymphadenopathy, moderate left pleural effusion with adjacent atelectasis; no evidence of mass, pulmonary embolism or pericardial effusion.  Last echo was 2017 which showed EF of 45-50%; he has no JVD, orthopnea, PND or other signs  of volume overload and therefore feel acute heart failure is less likely.  It is unusual for post-operative nonspecific effusion to present this late, but is still possible given there is no other obvious cause. Traumatic chylothorax is also possible. CT showed no pericardial effusion or thickening which makes postpericardiotomy syndrome less likely.  He will need therapeutic and diagnostic thoracentesis to further evaluate etiology of effusion.  - will follow-up on fluid analysis  - he is scheduled for repeat echo with cardiologist on 9/3 so will hold off on repeat echo for now   2. Chest pain - this is chronic, sharp and reproducible on exam. There is no evidence of ischemia on EKG; initial troponin negative. My overall suspicion for ACS is quite low. Given his significant cardiac history, will repeat another troponin and continue monitoring on telemetry. Based on exam and timing of symptoms, it is most likely post-sternotomy related. Have ordered Tylenol prn.   3. N/V - intermittent for several months. It does not seem to be related to eating. He cannot pinpoint any known triggers. May be related to hemodialysis fluid shifts. Will order CMP to check hepatic function.   4. ESRD - on HD M/W/F. No indication for urgent dialysis. Electrolytes stable. He is at his dry weight of 90 kg. Completed his dialysis session today. Anticipate him going home by Monday, but will consult  nephrology for inpatient dialysis management if he stays longer or if there is clinical indication for urgent dialysis.   5. HTN - blood pressure stable; continue on home Amlodipine 10 mg.   6. HLD - continue Lipitor 20 mg   7. CAD s/p CABG in 12/2017 - continue prescribed Imdur 30 mg  Diet: renal DVT ppx: heparin injection Code: full     Dispo: Admit patient to Observation with expected length of stay less than 2 midnights.  SignedDelice Bison, DO 06/06/2018, 11:16 PM  Pager: [phone]

## 2018-06-06 NOTE — ED Triage Notes (Signed)
Pt here from home c/o acute onset chest pain, shortness of breath, diaphoresis and generalized weakness since today at 11:30am. Pt also vomited twice prior to EMS arrival. Pt had dialysis this a.m and was brought down to his dry weight (90 kilos). Chest pain started after dialysis completion. Given 1 dose nitro by EMS. Pain prior to nitro 3/10. After nitro administration pain 0/10. VSS.

## 2018-06-06 NOTE — ED Notes (Signed)
Per lab, BMP was hemolyzed and needs to be recollected

## 2018-06-07 ENCOUNTER — Other Ambulatory Visit: Payer: Self-pay

## 2018-06-07 DIAGNOSIS — I2510 Atherosclerotic heart disease of native coronary artery without angina pectoris: Secondary | ICD-10-CM | POA: Diagnosis not present

## 2018-06-07 DIAGNOSIS — R0602 Shortness of breath: Secondary | ICD-10-CM

## 2018-06-07 DIAGNOSIS — E785 Hyperlipidemia, unspecified: Secondary | ICD-10-CM

## 2018-06-07 DIAGNOSIS — Z951 Presence of aortocoronary bypass graft: Secondary | ICD-10-CM | POA: Diagnosis not present

## 2018-06-07 DIAGNOSIS — F17290 Nicotine dependence, other tobacco product, uncomplicated: Secondary | ICD-10-CM

## 2018-06-07 DIAGNOSIS — I4581 Long QT syndrome: Secondary | ICD-10-CM | POA: Diagnosis not present

## 2018-06-07 DIAGNOSIS — Z79899 Other long term (current) drug therapy: Secondary | ICD-10-CM

## 2018-06-07 DIAGNOSIS — I120 Hypertensive chronic kidney disease with stage 5 chronic kidney disease or end stage renal disease: Secondary | ICD-10-CM | POA: Diagnosis not present

## 2018-06-07 DIAGNOSIS — R0789 Other chest pain: Secondary | ICD-10-CM | POA: Diagnosis not present

## 2018-06-07 DIAGNOSIS — N186 End stage renal disease: Secondary | ICD-10-CM

## 2018-06-07 DIAGNOSIS — J90 Pleural effusion, not elsewhere classified: Secondary | ICD-10-CM

## 2018-06-07 DIAGNOSIS — R112 Nausea with vomiting, unspecified: Secondary | ICD-10-CM | POA: Diagnosis not present

## 2018-06-07 DIAGNOSIS — Z992 Dependence on renal dialysis: Secondary | ICD-10-CM

## 2018-06-07 DIAGNOSIS — Z91048 Other nonmedicinal substance allergy status: Secondary | ICD-10-CM

## 2018-06-07 DIAGNOSIS — Z91040 Latex allergy status: Secondary | ICD-10-CM

## 2018-06-07 DIAGNOSIS — Z888 Allergy status to other drugs, medicaments and biological substances status: Secondary | ICD-10-CM

## 2018-06-07 DIAGNOSIS — Z8249 Family history of ischemic heart disease and other diseases of the circulatory system: Secondary | ICD-10-CM

## 2018-06-07 LAB — COMPREHENSIVE METABOLIC PANEL
ALT: 9 U/L (ref 0–44)
AST: 21 U/L (ref 15–41)
Albumin: 3.8 g/dL (ref 3.5–5.0)
Alkaline Phosphatase: 54 U/L (ref 38–126)
Anion gap: 14 (ref 5–15)
BUN: 24 mg/dL — ABNORMAL HIGH (ref 6–20)
CO2: 28 mmol/L (ref 22–32)
Calcium: 8.5 mg/dL — ABNORMAL LOW (ref 8.9–10.3)
Chloride: 93 mmol/L — ABNORMAL LOW (ref 98–111)
Creatinine, Ser: 8.01 mg/dL — ABNORMAL HIGH (ref 0.61–1.24)
GFR calc Af Amer: 7 mL/min — ABNORMAL LOW (ref >60)
GFR calc non Af Amer: 6 mL/min — ABNORMAL LOW (ref >60)
Glucose, Bld: 63 mg/dL — ABNORMAL LOW (ref 70–99)
Potassium: 4.8 mmol/L (ref 3.5–5.1)
Sodium: 135 mmol/L (ref 135–145)
Total Bilirubin: 0.9 mg/dL (ref 0.3–1.2)
Total Protein: 7.7 g/dL (ref 6.5–8.1)

## 2018-06-07 LAB — TROPONIN I
Troponin I: <0.03 ng/mL (ref <0.03)
Troponin I: 0.03 ng/mL (ref <0.03)
Troponin I: <0.03 ng/mL (ref <0.03)

## 2018-06-07 LAB — HIV ANTIBODY (ROUTINE TESTING W REFLEX): HIV Screen 4th Generation wRfx: NONREACTIVE

## 2018-06-07 MED ORDER — ALBUTEROL SULFATE HFA 108 (90 BASE) MCG/ACT IN AERS: 2.0000 | INHALATION_SPRAY | Freq: Four times a day (QID) | RESPIRATORY_TRACT | 2 refills | Status: AC | PRN

## 2018-06-07 NOTE — Progress Notes (Signed)
Internal Medicine Attending  Date: 06/07/2018  Patient name: [name] [name] Medical record number: [id number] Date of birth: [birth year] Age: 62 y.o. Gender: male  I saw and evaluated the patient. I reviewed the resident's note by Dr. Trilby Drummer and I agree with the resident's findings and plans as documented in his progress note.  Please see my H&P dated 06/07/2018 and attached to Dr. Janne Napoleon H&P dated 06/06/2018 for the specifics my evaluation, assessment, plan from earlier in the day.

## 2018-06-07 NOTE — Discharge Summary (Signed)
Name: [name] [name] MRN: [id number] DOB: May 04, 1956 62 y.o. PCP: [name] Seat, MD  Date of Admission: 06/06/2018  5:24 PM Date of Discharge: 06/07/2018 Attending Physician: [name] Linsey, MD  Discharge Diagnosis:  1. Shortness of breath 2. Pleural effusion  Discharge Medications: Allergies as of 06/07/2018      Reactions   Enalapril Hives, Rash   Latex Rash   Tape Rash, Other (See Comments)   TAPE MAKES SKIN BREAK OUT AND TURN RED      Medication List    TAKE these medications   albuterol 108 (90 Base) MCG/ACT inhaler Commonly known as:  PROVENTIL HFA;VENTOLIN HFA Inhale 2 puffs into the lungs every 6 (six) hours as needed for wheezing or shortness of breath.   amLODipine 10 MG tablet Commonly known as:  NORVASC Take 1 tablet (10 mg total) by mouth at bedtime.   aspirin EC 81 MG tablet Take 81 mg by mouth daily.   atorvastatin 20 MG tablet Commonly known as:  LIPITOR Take 20 mg by mouth at bedtime.   cadexomer iodine 0.9 % gel Commonly known as:  IODOSORB Apply 1 application topically daily as needed for wound care.   cinacalcet 90 MG tablet Commonly known as:  SENSIPAR Take 180 mg by mouth every Monday, Wednesday, and Friday with hemodialysis.   isosorbide mononitrate 30 MG 24 hr tablet Commonly known as:  IMDUR Take 1 tablet (30 mg total) by mouth daily. What changed:    when to take this  reasons to take this   multivitamin Tabs tablet Take 1 tablet by mouth at bedtime.   nitroGLYCERIN 0.4 MG SL tablet Commonly known as:  NITROSTAT Place 0.4 mg under the tongue every 5 (five) minutes as needed for chest pain.   ondansetron 4 MG tablet Commonly known as:  ZOFRAN Take 1 tablet (4 mg total) by mouth 4 (four) times daily as needed for nausea or vomiting.   rOPINIRole 0.25 MG tablet Commonly known as:  REQUIP Take 1 tablet (0.25 mg total) by mouth at bedtime.   sevelamer carbonate 800 MG tablet Commonly known as:  RENVELA Take 4 tablets  (3,200 mg total) by mouth 3 (three) times daily with meals. What changed:    how much to take  when to take this  additional instructions   traMADol 50 MG tablet Commonly known as:  ULTRAM Take 1 tablet (50 mg total) by mouth every 6 (six) hours as needed. What changed:  reasons to take this       Disposition and follow-up:   [name] was discharged from [facility] in Stable condition.  At the hospital follow up visit please address:  1.  Shortness of breath - Evaluate for recurrence of symptoms - Ensure able to obtain inhaler for SOB episodes - Consider PFTs to evaluate for obstructive lung disease given smoking history and resolution of symptoms with breathing treatment - Ask patient if nephrology has reduced his dry weight from 90kg  2.  Labs / imaging needed at time of follow-up: PFTs  3.  Pending labs/ test needing follow-up: HIV Screen  Follow-up Appointments:   Hospital Course by problem list: Shortness of breath Left pleural effusion L pleural effusion on Admission CXR had been present since CXR on 01/2018, but has increased in size since that time. CT scan demonstrated mild paratracheal lymphadenopathy, moderate left pleural effusion with adjacent atelectasis; no evidence of mass, pulmonary embolism or pericardial effusion (this was consistent with prior CT). Patient is ESRD  on HD and his effusion is is thought be due to volume overload; with need to adjust his dry weight. Do not suspect CHF exacerbation with no JVD, orthopnea, PND. (has cardiology f/u 9/3, echo planned) Do not suspect post-operative nonspecific effusion to present this late. His symptoms resolved with a breathing treatment and did not recur overnight. Discharged to home with prescription for albuterol inhaler. Called nephrologist office and left a message with their answering service regarding reevaluation of dry weight and suggestion to lower dry weight by ~1kg. Will need PFTs  outpatient to evaluate for COPD.  2. Chest pain. Presented with chronic, sharp and reproducible pain. EKG non-ischemic. Troponin Trend <0.03, <0.03, 0.03, <0/03. Pain resolved overnight.  Discharge Vitals:   BP (!) 173/95   Pulse 92   Temp 97.8 F (36.6 C) (Oral)   Resp (!) 25   Ht 6' (1.829 m)   Wt 90.2 kg   SpO2 95%   BMI 26.97 kg/m   Pertinent Labs, Studies, and Procedures:  CBC Latest Ref Rng & Units 06/06/2018 06/06/2018 05/27/2018  WBC 4.0 - 10.5 K/uL - 5.6 5.7  Hemoglobin 13.0 - 17.0 g/dL 13.9 12.3(L) 12.7(L)  Hematocrit 39.0 - 52.0 % 41.0 40.2 39.8  Platelets 150 - 400 K/uL - 151 200   CMP Latest Ref Rng & Units 06/07/2018 06/06/2018 06/06/2018  Glucose 70 - 99 mg/dL 63(L) 81 82  BUN 6 - 20 mg/dL 24(H) 20 19  Creatinine 0.61 - 1.24 mg/dL 8.01(H) 7.60(H) 6.93(H)  Sodium 135 - 145 mmol/L 135 135 136  Potassium 3.5 - 5.1 mmol/L 4.8 4.7 4.8  Chloride 98 - 111 mmol/L 93(L) 92(L) 92(L)  CO2 22 - 32 mmol/L 28 - 31  Calcium 8.9 - 10.3 mg/dL 8.5(L) - 8.3(L)  Total Protein 6.5 - 8.1 g/dL 7.7 - -  Total Bilirubin 0.3 - 1.2 mg/dL 0.9 - -  Alkaline Phos 38 - 126 U/L 54 - -  AST 15 - 41 U/L 21 - -  ALT 0 - 44 U/L 9 - -   Admission EKG  EKG Interpretation  Date/Time:  Friday June 06 2018 17:30:44 EDT Ventricular Rate:  91 PR Interval:    QRS Duration: 102 QT Interval:  419 QTC Calculation: 516 R Axis:   11 Text Interpretation:  Sinus rhythm Probable left atrial enlargement Abnormal R-wave progression, late transition Left ventricular hypertrophy Prolonged QT interval No STEMI.  Confirmed by [name] [name] [phone]) on 06/06/2018 6:17:05 PM      Troponin Trend <0.03, <0.03, 0.03, <0.03  HIV screen: Pending  CXR: IMPRESSION: 1. Increased now moderate left pleural effusion with adjacent left lower lobe atelectasis versus pneumonia.  Chest CT: IMPRESSION: 1. 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 2. Moderate left effusion with atelectasis. 3. Mild bronchiectasis within both lower lobes and lingula with subpleural scarring in the left upper lobe and lingula. 4. Bilateral renal cysts.  Discharge Instructions: Discharge Instructions    Call MD for:  difficulty breathing, headache or visual disturbances   Complete by:  As directed    Call MD for:  persistant dizziness or light-headedness   Complete by:  As directed    Call MD for:  persistant nausea and vomiting   Complete by:  As directed    Call MD for:  temperature >100.4   Complete by:  As directed    Diet - low sodium heart healthy   Complete by:  As directed  Discharge instructions   Complete by:  As directed    Thank you for allowing [place] to care for you  Your shortness of breath appears to be related to the fluid on your left lung and possibly some airways disease - We would like you kidney doctor to reevaluate you target weight in dialysis and consider reducing this number. - We will follow up with you in clinic concerning further evaluation of your breathing  Please follow up in clinic in the next 1 weeks   Increase activity slowly   Complete by:  As directed       Signed: Neva Seat, MD 06/07/2018, 11:40 AM   Pager: [phone]

## 2018-06-07 NOTE — Progress Notes (Signed)
RN paged the following to resident: "critical lab.. pt's troponin was 0.03 taken at 0700. Pt's glucose was 63. Thanks" Awaiting response.  Riley Kill, RN

## 2018-06-07 NOTE — Progress Notes (Addendum)
   Subjective: [name] was seen at bedside this morning. He states he is feeling back to normal and is ready to go home. He denies further chest pain or shortness of breath. He states his shortness of breath was relieved following nebulizer treatment and was informed we would work on getting him an inhaler to use at home. He has no further complaints this morning.  Objective:  Vital signs in last 24 hours: Vitals:   06/06/18 2045 06/06/18 2100 06/06/18 2307 06/06/18 2341  BP: (!) 174/91 (!) 171/95  (!) 159/84  Pulse: 89 90  91  Resp: 17 (!) 22  (!) 25  Temp:    98.6 F (37 C)  TempSrc:    Oral  SpO2: 96% 96%  97%  Weight:   90.2 kg   Height:   6' (1.829 m)    Physical Exam  Constitutional: He appears well-developed and well-nourished. No distress.  Cardiovascular: Normal rate, regular rhythm, normal heart sounds and intact distal pulses.  Pulmonary/Chest: Effort normal. No respiratory distress.  Decreased breath sounds left base Mild crackles  Abdominal: Soft. Bowel sounds are normal. He exhibits no distension. There is no tenderness.  Musculoskeletal: He exhibits no edema or deformity.  Skin: Skin is warm and dry.   Assessment/Plan:  Active Problems:   SOB (shortness of breath) 1. Shortness of breath Left pleural effusion L pleural effusion on Admission CXR has been present since CXR on 01/2018, but has increased in size since that time. Admission CT scan demonstrated mild paratracheal lymphadenopathy, moderate left pleural effusion with adjacent atelectasis; no evidence of mass, pulmonary embolism or pericardial effusion. Patient is ESRD on HD and his effusion is most likely related to volume overload due to need to adjust his dry weight. > Last [id number] (EF 45-50), Do not suspect heart failure exacerbation with no JVD, orthopnea, PND. (has cardiology f/u 9/3, echo planned) > Do note suspect post-operative nonspecific effusion to present this late > Patient states his symptoms  have resolved since receiving a breathing treatment yesterday, he will be provided with an albuterol inhaler and education on its use by respiratory - Discharge to home today with albuterol inhaler - Will contact his nephrologist's office regarding reevaluation of dry weight - Will need PFTs outpatient to evaluate for COPD  2. Chest pain - Presented with chronic, sharp and reproducible pain. - EKG non-ischemic - Troponin Trend <0.03, <0.03, 0.03 (in patient on HD)  3. N/V - intermittent for several months. Does not seem to be related to eating. He cannot pinpoint any known triggers. May be related to hemodialysis fluid shifts.  - CMP shows normal hepatic function.   4. ESRD - on HD M/W/F. No indication for urgent dialysis. Electrolytes stable. He is at his current estimated dry weight of 90 kg; given his gradually increasing effusion, his dry weight will need to be lowered slightly.  Diet: Renal DVT ppx: Heparin Code: Full   Dispo: Anticipated discharge Today.   Neva Seat, MD 06/07/2018, 6:40 AM Pager: [phone]

## 2018-06-08 DIAGNOSIS — N186 End stage renal disease: Secondary | ICD-10-CM | POA: Diagnosis not present

## 2018-06-08 DIAGNOSIS — E1122 Type 2 diabetes mellitus with diabetic chronic kidney disease: Secondary | ICD-10-CM | POA: Diagnosis not present

## 2018-06-08 DIAGNOSIS — Z992 Dependence on renal dialysis: Secondary | ICD-10-CM | POA: Diagnosis not present

## 2018-06-09 DIAGNOSIS — D509 Iron deficiency anemia, unspecified: Secondary | ICD-10-CM | POA: Diagnosis not present

## 2018-06-09 DIAGNOSIS — N186 End stage renal disease: Secondary | ICD-10-CM | POA: Diagnosis not present

## 2018-06-09 DIAGNOSIS — E1129 Type 2 diabetes mellitus with other diabetic kidney complication: Secondary | ICD-10-CM | POA: Diagnosis not present

## 2018-06-09 DIAGNOSIS — D631 Anemia in chronic kidney disease: Secondary | ICD-10-CM | POA: Diagnosis not present

## 2018-06-09 DIAGNOSIS — N2581 Secondary hyperparathyroidism of renal origin: Secondary | ICD-10-CM | POA: Diagnosis not present

## 2018-06-10 ENCOUNTER — Other Ambulatory Visit: Payer: Self-pay

## 2018-06-10 ENCOUNTER — Ambulatory Visit (HOSPITAL_COMMUNITY): Payer: Medicare Other | Attending: Cardiovascular Disease

## 2018-06-10 DIAGNOSIS — I119 Hypertensive heart disease without heart failure: Secondary | ICD-10-CM | POA: Insufficient documentation

## 2018-06-10 DIAGNOSIS — I272 Pulmonary hypertension, unspecified: Secondary | ICD-10-CM | POA: Diagnosis not present

## 2018-06-10 DIAGNOSIS — Z952 Presence of prosthetic heart valve: Secondary | ICD-10-CM | POA: Insufficient documentation

## 2018-06-10 DIAGNOSIS — R011 Cardiac murmur, unspecified: Secondary | ICD-10-CM | POA: Insufficient documentation

## 2018-06-10 DIAGNOSIS — E119 Type 2 diabetes mellitus without complications: Secondary | ICD-10-CM | POA: Insufficient documentation

## 2018-06-10 DIAGNOSIS — I2510 Atherosclerotic heart disease of native coronary artery without angina pectoris: Secondary | ICD-10-CM | POA: Diagnosis not present

## 2018-06-10 DIAGNOSIS — Z951 Presence of aortocoronary bypass graft: Secondary | ICD-10-CM | POA: Insufficient documentation

## 2018-06-10 DIAGNOSIS — Z720 Tobacco use: Secondary | ICD-10-CM | POA: Diagnosis not present

## 2018-06-10 DIAGNOSIS — D649 Anemia, unspecified: Secondary | ICD-10-CM | POA: Insufficient documentation

## 2018-06-10 DIAGNOSIS — I081 Rheumatic disorders of both mitral and tricuspid valves: Secondary | ICD-10-CM | POA: Insufficient documentation

## 2018-06-11 DIAGNOSIS — D631 Anemia in chronic kidney disease: Secondary | ICD-10-CM | POA: Diagnosis not present

## 2018-06-11 DIAGNOSIS — E1129 Type 2 diabetes mellitus with other diabetic kidney complication: Secondary | ICD-10-CM | POA: Diagnosis not present

## 2018-06-11 DIAGNOSIS — D509 Iron deficiency anemia, unspecified: Secondary | ICD-10-CM | POA: Diagnosis not present

## 2018-06-11 DIAGNOSIS — N2581 Secondary hyperparathyroidism of renal origin: Secondary | ICD-10-CM | POA: Diagnosis not present

## 2018-06-11 DIAGNOSIS — N186 End stage renal disease: Secondary | ICD-10-CM | POA: Diagnosis not present

## 2018-06-12 ENCOUNTER — Encounter: Payer: Medicare Other | Admitting: Internal Medicine

## 2018-06-13 DIAGNOSIS — N2581 Secondary hyperparathyroidism of renal origin: Secondary | ICD-10-CM | POA: Diagnosis not present

## 2018-06-13 DIAGNOSIS — N186 End stage renal disease: Secondary | ICD-10-CM | POA: Diagnosis not present

## 2018-06-13 DIAGNOSIS — D631 Anemia in chronic kidney disease: Secondary | ICD-10-CM | POA: Diagnosis not present

## 2018-06-13 DIAGNOSIS — E1129 Type 2 diabetes mellitus with other diabetic kidney complication: Secondary | ICD-10-CM | POA: Diagnosis not present

## 2018-06-13 DIAGNOSIS — D509 Iron deficiency anemia, unspecified: Secondary | ICD-10-CM | POA: Diagnosis not present

## 2018-06-16 DIAGNOSIS — E1129 Type 2 diabetes mellitus with other diabetic kidney complication: Secondary | ICD-10-CM | POA: Diagnosis not present

## 2018-06-16 DIAGNOSIS — D509 Iron deficiency anemia, unspecified: Secondary | ICD-10-CM | POA: Diagnosis not present

## 2018-06-16 DIAGNOSIS — N2581 Secondary hyperparathyroidism of renal origin: Secondary | ICD-10-CM | POA: Diagnosis not present

## 2018-06-16 DIAGNOSIS — N186 End stage renal disease: Secondary | ICD-10-CM | POA: Diagnosis not present

## 2018-06-16 DIAGNOSIS — D631 Anemia in chronic kidney disease: Secondary | ICD-10-CM | POA: Diagnosis not present

## 2018-06-17 ENCOUNTER — Ambulatory Visit: Payer: Medicare Other | Admitting: Cardiovascular Disease

## 2018-06-17 ENCOUNTER — Ambulatory Visit: Payer: Medicare Other | Admitting: Sports Medicine

## 2018-06-18 DIAGNOSIS — E1129 Type 2 diabetes mellitus with other diabetic kidney complication: Secondary | ICD-10-CM | POA: Diagnosis not present

## 2018-06-18 DIAGNOSIS — N2581 Secondary hyperparathyroidism of renal origin: Secondary | ICD-10-CM | POA: Diagnosis not present

## 2018-06-18 DIAGNOSIS — D631 Anemia in chronic kidney disease: Secondary | ICD-10-CM | POA: Diagnosis not present

## 2018-06-18 DIAGNOSIS — N186 End stage renal disease: Secondary | ICD-10-CM | POA: Diagnosis not present

## 2018-06-18 DIAGNOSIS — D509 Iron deficiency anemia, unspecified: Secondary | ICD-10-CM | POA: Diagnosis not present

## 2018-06-20 DIAGNOSIS — D631 Anemia in chronic kidney disease: Secondary | ICD-10-CM | POA: Diagnosis not present

## 2018-06-20 DIAGNOSIS — N2581 Secondary hyperparathyroidism of renal origin: Secondary | ICD-10-CM | POA: Diagnosis not present

## 2018-06-20 DIAGNOSIS — N186 End stage renal disease: Secondary | ICD-10-CM | POA: Diagnosis not present

## 2018-06-20 DIAGNOSIS — D509 Iron deficiency anemia, unspecified: Secondary | ICD-10-CM | POA: Diagnosis not present

## 2018-06-20 DIAGNOSIS — E1129 Type 2 diabetes mellitus with other diabetic kidney complication: Secondary | ICD-10-CM | POA: Diagnosis not present

## 2018-06-23 DIAGNOSIS — N186 End stage renal disease: Secondary | ICD-10-CM | POA: Diagnosis not present

## 2018-06-23 DIAGNOSIS — D509 Iron deficiency anemia, unspecified: Secondary | ICD-10-CM | POA: Diagnosis not present

## 2018-06-23 DIAGNOSIS — E1129 Type 2 diabetes mellitus with other diabetic kidney complication: Secondary | ICD-10-CM | POA: Diagnosis not present

## 2018-06-23 DIAGNOSIS — D631 Anemia in chronic kidney disease: Secondary | ICD-10-CM | POA: Diagnosis not present

## 2018-06-23 DIAGNOSIS — N2581 Secondary hyperparathyroidism of renal origin: Secondary | ICD-10-CM | POA: Diagnosis not present

## 2018-06-24 ENCOUNTER — Ambulatory Visit: Payer: Medicare Other | Admitting: Sports Medicine

## 2018-06-25 DIAGNOSIS — N2581 Secondary hyperparathyroidism of renal origin: Secondary | ICD-10-CM | POA: Diagnosis not present

## 2018-06-25 DIAGNOSIS — E1129 Type 2 diabetes mellitus with other diabetic kidney complication: Secondary | ICD-10-CM | POA: Diagnosis not present

## 2018-06-25 DIAGNOSIS — N186 End stage renal disease: Secondary | ICD-10-CM | POA: Diagnosis not present

## 2018-06-25 DIAGNOSIS — D631 Anemia in chronic kidney disease: Secondary | ICD-10-CM | POA: Diagnosis not present

## 2018-06-25 DIAGNOSIS — D509 Iron deficiency anemia, unspecified: Secondary | ICD-10-CM | POA: Diagnosis not present

## 2018-06-27 DIAGNOSIS — D631 Anemia in chronic kidney disease: Secondary | ICD-10-CM | POA: Diagnosis not present

## 2018-06-27 DIAGNOSIS — E1129 Type 2 diabetes mellitus with other diabetic kidney complication: Secondary | ICD-10-CM | POA: Diagnosis not present

## 2018-06-27 DIAGNOSIS — N186 End stage renal disease: Secondary | ICD-10-CM | POA: Diagnosis not present

## 2018-06-27 DIAGNOSIS — D509 Iron deficiency anemia, unspecified: Secondary | ICD-10-CM | POA: Diagnosis not present

## 2018-06-27 DIAGNOSIS — N2581 Secondary hyperparathyroidism of renal origin: Secondary | ICD-10-CM | POA: Diagnosis not present

## 2018-06-30 DIAGNOSIS — N2581 Secondary hyperparathyroidism of renal origin: Secondary | ICD-10-CM | POA: Diagnosis not present

## 2018-06-30 DIAGNOSIS — E1129 Type 2 diabetes mellitus with other diabetic kidney complication: Secondary | ICD-10-CM | POA: Diagnosis not present

## 2018-06-30 DIAGNOSIS — D631 Anemia in chronic kidney disease: Secondary | ICD-10-CM | POA: Diagnosis not present

## 2018-06-30 DIAGNOSIS — D509 Iron deficiency anemia, unspecified: Secondary | ICD-10-CM | POA: Diagnosis not present

## 2018-06-30 DIAGNOSIS — N186 End stage renal disease: Secondary | ICD-10-CM | POA: Diagnosis not present

## 2018-06-30 NOTE — Addendum Note (Signed)
Addended by: Hulan Fray on: 06/30/2018 03:34 PM   Modules accepted: Orders

## 2018-07-01 ENCOUNTER — Ambulatory Visit: Payer: Medicare Other | Admitting: Sports Medicine

## 2018-07-02 ENCOUNTER — Other Ambulatory Visit: Payer: Self-pay | Admitting: Internal Medicine

## 2018-07-02 DIAGNOSIS — D631 Anemia in chronic kidney disease: Secondary | ICD-10-CM | POA: Diagnosis not present

## 2018-07-02 DIAGNOSIS — E1129 Type 2 diabetes mellitus with other diabetic kidney complication: Secondary | ICD-10-CM | POA: Diagnosis not present

## 2018-07-02 DIAGNOSIS — D509 Iron deficiency anemia, unspecified: Secondary | ICD-10-CM | POA: Diagnosis not present

## 2018-07-02 DIAGNOSIS — N2581 Secondary hyperparathyroidism of renal origin: Secondary | ICD-10-CM | POA: Diagnosis not present

## 2018-07-02 DIAGNOSIS — N186 End stage renal disease: Secondary | ICD-10-CM | POA: Diagnosis not present

## 2018-07-02 NOTE — Telephone Encounter (Signed)
Refill approved.

## 2018-07-06 ENCOUNTER — Encounter (HOSPITAL_COMMUNITY): Payer: Self-pay

## 2018-07-06 ENCOUNTER — Emergency Department (HOSPITAL_COMMUNITY): Payer: Medicare Other

## 2018-07-06 ENCOUNTER — Emergency Department (HOSPITAL_COMMUNITY)
Admission: EM | Admit: 2018-07-06 | Discharge: 2018-07-06 | Disposition: A | Discharge status: Home / Self Care | Payer: Medicare Other | Attending: Emergency Medicine | Admitting: Emergency Medicine

## 2018-07-06 DIAGNOSIS — Z951 Presence of aortocoronary bypass graft: Secondary | ICD-10-CM | POA: Insufficient documentation

## 2018-07-06 DIAGNOSIS — Z8673 Personal history of transient ischemic attack (TIA), and cerebral infarction without residual deficits: Secondary | ICD-10-CM | POA: Diagnosis not present

## 2018-07-06 DIAGNOSIS — Z7982 Long term (current) use of aspirin: Secondary | ICD-10-CM | POA: Insufficient documentation

## 2018-07-06 DIAGNOSIS — I2510 Atherosclerotic heart disease of native coronary artery without angina pectoris: Secondary | ICD-10-CM | POA: Insufficient documentation

## 2018-07-06 DIAGNOSIS — I132 Hypertensive heart and chronic kidney disease with heart failure and with stage 5 chronic kidney disease, or end stage renal disease: Secondary | ICD-10-CM | POA: Insufficient documentation

## 2018-07-06 DIAGNOSIS — I120 Hypertensive chronic kidney disease with stage 5 chronic kidney disease or end stage renal disease: Secondary | ICD-10-CM | POA: Diagnosis not present

## 2018-07-06 DIAGNOSIS — I5032 Chronic diastolic (congestive) heart failure: Secondary | ICD-10-CM | POA: Diagnosis not present

## 2018-07-06 DIAGNOSIS — Z992 Dependence on renal dialysis: Secondary | ICD-10-CM | POA: Insufficient documentation

## 2018-07-06 DIAGNOSIS — R0602 Shortness of breath: Secondary | ICD-10-CM | POA: Diagnosis not present

## 2018-07-06 DIAGNOSIS — N186 End stage renal disease: Secondary | ICD-10-CM

## 2018-07-06 DIAGNOSIS — Z91040 Latex allergy status: Secondary | ICD-10-CM | POA: Diagnosis not present

## 2018-07-06 DIAGNOSIS — Z79899 Other long term (current) drug therapy: Secondary | ICD-10-CM | POA: Insufficient documentation

## 2018-07-06 DIAGNOSIS — G8929 Other chronic pain: Secondary | ICD-10-CM | POA: Insufficient documentation

## 2018-07-06 DIAGNOSIS — M5489 Other dorsalgia: Secondary | ICD-10-CM | POA: Diagnosis not present

## 2018-07-06 DIAGNOSIS — M549 Dorsalgia, unspecified: Secondary | ICD-10-CM | POA: Insufficient documentation

## 2018-07-06 DIAGNOSIS — E1122 Type 2 diabetes mellitus with diabetic chronic kidney disease: Secondary | ICD-10-CM | POA: Diagnosis not present

## 2018-07-06 DIAGNOSIS — Z87891 Personal history of nicotine dependence: Secondary | ICD-10-CM | POA: Insufficient documentation

## 2018-07-06 DIAGNOSIS — J90 Pleural effusion, not elsewhere classified: Secondary | ICD-10-CM | POA: Diagnosis not present

## 2018-07-06 DIAGNOSIS — E1140 Type 2 diabetes mellitus with diabetic neuropathy, unspecified: Secondary | ICD-10-CM | POA: Insufficient documentation

## 2018-07-06 DIAGNOSIS — R0902 Hypoxemia: Secondary | ICD-10-CM | POA: Diagnosis not present

## 2018-07-06 DIAGNOSIS — Z952 Presence of prosthetic heart valve: Secondary | ICD-10-CM | POA: Insufficient documentation

## 2018-07-06 DIAGNOSIS — R52 Pain, unspecified: Secondary | ICD-10-CM | POA: Diagnosis not present

## 2018-07-06 DIAGNOSIS — J80 Acute respiratory distress syndrome: Secondary | ICD-10-CM | POA: Diagnosis not present

## 2018-07-06 LAB — BASIC METABOLIC PANEL
ANION GAP: 18 — AB (ref 5–15)
BUN: 74 mg/dL — ABNORMAL HIGH (ref 6–20)
CO2: 23 mmol/L (ref 22–32)
CREATININE: 14.1 mg/dL — AB (ref 0.61–1.24)
Calcium: 9 mg/dL (ref 8.9–10.3)
Chloride: 98 mmol/L (ref 98–111)
GFR calc non Af Amer: 3 mL/min — ABNORMAL LOW (ref >60)
GFR, EST AFRICAN AMERICAN: 4 mL/min — AB (ref >60)
Glucose, Bld: 96 mg/dL (ref 70–99)
POTASSIUM: 5.4 mmol/L — AB (ref 3.5–5.1)
SODIUM: 139 mmol/L (ref 135–145)

## 2018-07-06 LAB — CBC WITH DIFFERENTIAL/PLATELET
ABS IMMATURE GRANULOCYTES: 0 10*3/uL (ref 0.0–0.1)
BASOS ABS: 0.1 10*3/uL (ref 0.0–0.1)
Basophils Relative: 1 %
EOS PCT: 10 %
Eosinophils Absolute: 0.8 10*3/uL — ABNORMAL HIGH (ref 0.0–0.7)
HEMATOCRIT: 33 % — AB (ref 39.0–52.0)
HEMOGLOBIN: 10.2 g/dL — AB (ref 13.0–17.0)
IMMATURE GRANULOCYTES: 0 %
LYMPHS ABS: 0.7 10*3/uL (ref 0.7–4.0)
LYMPHS PCT: 9 %
MCH: 25.3 pg — ABNORMAL LOW (ref 26.0–34.0)
MCHC: 30.9 g/dL (ref 30.0–36.0)
MCV: 81.9 fL (ref 78.0–100.0)
Monocytes Absolute: 0.7 10*3/uL (ref 0.1–1.0)
Monocytes Relative: 9 %
NEUTROS ABS: 5.4 10*3/uL (ref 1.7–7.7)
Neutrophils Relative %: 71 %
Platelets: 164 10*3/uL (ref 150–400)
RBC: 4.03 MIL/uL — ABNORMAL LOW (ref 4.22–5.81)
RDW: 19.7 % — ABNORMAL HIGH (ref 11.5–15.5)
WBC: 7.7 10*3/uL (ref 4.0–10.5)

## 2018-07-06 LAB — TROPONIN I: TROPONIN I: 0.03 ng/mL — AB (ref <0.03)

## 2018-07-06 MED ORDER — HYDROCODONE-ACETAMINOPHEN 5-325 MG PO TABS
1.0000 | ORAL_TABLET | Freq: Once | ORAL | Status: AC
  Administered 2018-07-06: 1 via ORAL
  Filled 2018-07-06: qty 1

## 2018-07-06 NOTE — Discharge Instructions (Addendum)
You were seen today for shortness of breath and back pain.  You have a persistent pleural effusion.  Your work-up was notable for potassium of 5.4.  This is borderline.  It is very important that you get dialysis as scheduled tomorrow morning.  If you have any new or worsening symptoms you should be reevaluated immediately.

## 2018-07-06 NOTE — ED Notes (Signed)
PT states understanding of care given, follow up care. PT ambulated from ED to car with a steady gait.  

## 2018-07-06 NOTE — ED Triage Notes (Signed)
Pt arrives via [facility], sudden onset SOB walking home from store. Initially 85%SPo2 on FD arrival, up to mid ninties on 3 L02. 1 douneb, 99% on douneb.  12 lead unremarkable, dialysis pt L arm restricted.

## 2018-07-06 NOTE — ED Notes (Signed)
Pt ambulated with pulse oxy stats stayed at 96%

## 2018-07-06 NOTE — ED Provider Notes (Signed)
[facility] EMERGENCY DEPARTMENT Provider Note   CSN: [id number] Arrival date & time: 07/06/18  0229     History   Chief Complaint Chief Complaint  Patient presents with  . Shortness of Breath    HPI [name] [name] is a 62 y.o. male.  HPI  This is a 62 year old male with a history of end-stage renal disease on dialysis Monday, Wednesday, Friday, coronary artery disease, heart failure, chronic back pain who presents with shortness of breath.  Per EMS, patient developed shortness of breath after walking to the store.  Patient reports that he missed dialysis on Friday because of a family matter.  He states however that he does not feel volume overloaded.  He denies any recent fevers or cough.  He is not on any home oxygen.  Patient reports acute worsening of chronic back pain.  Reports mid thoracic back pain that is nonradiating sharp.  He takes multiple medications at home including tramadol with minimal relief.  Currently rates his pain 8 out of 10.  Denies any new urinary symptoms, weakness, numbness, tingling of the lower extremities.  Denies chest pain.  Past Medical History:  Diagnosis Date  . Anemia   . Anginal pain ([place])   . CAD (coronary artery disease)    a. per [facility] s/p 3.27mm x 6mm Vision BMS to mid LAD 12/2009 and Xience DES to mid LAD 10/2010.  [name] Chronic diastolic CHF (congestive heart failure) ([place])   . Colon polyps   . Daily headache   . ESRD on dialysis [facility]) since ~ 2008   "MWF; Jeneen Rinks" (03/04/2017)  . GERD (gastroesophageal reflux disease)   . H/O TIA (transient ischemic attack) 04/01/2015  . H/O TIA (transient ischemic attack) 04/01/2015  . Heart murmur   . Hematochezia    a. 2014: colonscopy, which showed moderately-sized internal hemorrhoids, two 22mm polyps in transverse colon and ascending colon that were resected, five 2-59mm polyps in sigmoid colon, descending colon, transverse colon, and ascending colon that were resected. An  upper endoscopy was performed and showed normal esophagus, stomach, and duodenum.  . Hematuria    a. H/o hematuria 2014 with cystoscopy that was unrevealing for his source of hematuria. He underwent a kidney ultrasound on 10/14 that showed mildly echogenic and scarred kidneys compatible with medical renal disease, without hydronephrosis or renal calculi.  [name] History of blood transfusion    "had colonoscopy done; they had to give me some blood"  . History of kidney stones   . Hyperlipidemia   . Hypertension   . On home oxygen therapy    "2L prn" (07/21/2015); "been off it for awhile" (03/04/2017)  . Pneumonia   . Renal insufficiency   . 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 Type II diabetes mellitus ([place])   . Wears dentures     Patient Active Problem List   Diagnosis Date Noted  . Pleural effusion on left   . ESRD on dialysis ([place])   . SOB (shortness of breath) 06/06/2018  . Critical lower limb ischemia 05/27/2018  . Hx of CABG 02/02/2018  . Hypervolemia associated with renal insufficiency 01/26/2018  . Hyperkalemia 01/26/2018  . Restless leg syndrome 01/14/2018  . Anemia 01/14/2018  . Tobacco use 10/29/2017  . Mild cognitive impairment 06/13/2017  . Chest pain 06/07/2017  . Skin ulcer of toe of right foot, limited to breakdown of skin ([place])   . Callus of foot 03/07/2017  .  Pulmonary hypertension ([place]) 07/28/2016  . Stroke-like symptoms 06/30/2016  . Peripheral neuropathy 05/07/2016  . S/P coronary artery stent placement 05/07/2016  . Nausea and vomiting 04/02/2016  . ESRD (end stage renal disease) ([place]) 03/18/2016  . Mitral regurgitation   . Diabetic neuropathy ([place]) 07/29/2015  . Depression 07/22/2015  . GERD (gastroesophageal reflux disease) 06/04/2015  . Malnutrition of moderate degree ([place]) 05/17/2015  . Thrombocytopenia ([place]) 05/16/2015  . Weight loss 05/16/2015  . Diastolic dysfunction-grade 2 12/13/2014  . HCAP (healthcare-associated  pneumonia) 10/01/2014  . 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 Arm pain, right 05/25/2014  . Numbness 05/25/2014  . Hypertension 04/27/2014  . CAD -S/P LAD BMS 2011, LAD DES 2012- patent cors Feb 2016 04/27/2014  . Diabetes mellitus type 2, diet-controlled ([place]) 04/27/2014  . Background diabetic retinopathy ([place]) 08/13/2012  . Primary localized osteoarthrosis, lower leg 04/15/2012  . Senile nuclear sclerosis 11/29/2010  . Vitreous hemorrhage ([place]) 11/29/2010  . Atherosclerotic heart disease of native coronary artery without angina pectoris 12/12/2009  . Hypercholesterolemia 11/30/2003    Past Surgical History:  Procedure Laterality Date  . ABDOMINAL AORTOGRAM W/LOWER EXTREMITY N/A 05/29/2018   Procedure: ABDOMINAL AORTOGRAM W/LOWER EXTREMITY;  Surgeon: Lorretta Harp, MD;  Location: [facility] CV LAB;  Service: Cardiovascular;  Laterality: N/A;  . AV FISTULA PLACEMENT Left ~ 2007   "upper arm"  . CARDIAC CATHETERIZATION  "several"  . CORONARY ANGIOPLASTY WITH STENT PLACEMENT  "several"  . CORONARY ARTERY BYPASS GRAFT     3 grafts  . CYSTOSCOPY W/ STONE MANIPULATION  X2?  . EYE SURGERY Bilateral    "laser OR for hemorrhage"  . LEFT HEART CATHETERIZATION WITH CORONARY ANGIOGRAM N/A 11/23/2014   Procedure: LEFT HEART CATHETERIZATION WITH CORONARY ANGIOGRAM;  Surgeon: Troy Sine, MD;  Location: [facility] CATH LAB;  Service: Cardiovascular;  Laterality: N/A;  . LITHOTRIPSY  X1  . MITRAL VALVE REPLACEMENT    . REVISON OF ARTERIOVENOUS FISTULA Left 16/60/6301   Procedure: PLICATION OF LEFT ARM ARTERIOVENOUS FISTULA;  Surgeon: Angelia Mould, MD;  Location: [facility];  Service: Vascular;  Laterality: Left;  . REVISON OF ARTERIOVENOUS FISTULA Left 03/09/931   Procedure: PLICATE ANEURYSM  OF LEFT ARTERIOVENOUS FISTULA;  Surgeon: Waynetta Sandy, MD;  Location: [facility];  Service: Vascular;  Laterality: Left;        Home Medications    Prior to Admission  medications   Medication Sig Start Date End Date Taking? Authorizing Provider  albuterol (PROVENTIL HFA;VENTOLIN HFA) 108 (90 Base) MCG/ACT inhaler Inhale 2 puffs into the lungs every 6 (six) hours as needed for wheezing or shortness of breath. 06/07/18   Neva Seat, MD  amLODipine (NORVASC) 10 MG tablet Take 1 tablet (10 mg total) by mouth at bedtime. 04/17/18   Neva Seat, MD  aspirin EC 81 MG tablet Take 81 mg by mouth daily.    [provider]  atorvastatin (LIPITOR) 20 MG tablet Take 20 mg by mouth at bedtime.     [provider]  cadexomer iodine (IODOSORB) 0.9 % gel Apply 1 application topically daily as needed for wound care. Patient not taking: Reported on 05/27/2018 05/06/18   Landis Martins, DPM  cinacalcet (SENSIPAR) 90 MG tablet Take 180 mg by mouth every Monday, Wednesday, and Friday with hemodialysis.     [provider]  isosorbide mononitrate (IMDUR) 30 MG 24 hr tablet Take 1 tablet (30 mg total) by mouth daily. Patient taking differently: Take 30 mg by mouth daily as needed (  chest pain).  10/29/17 10/29/18  Alphonzo Grieve, MD  multivitamin (RENA-VIT) TABS tablet TAKE 1 TABLET BY MOUTH ONCE DAILY AT BEDTIME 07/02/18   Neva Seat, MD  nitroGLYCERIN (NITROSTAT) 0.4 MG SL tablet Place 0.4 mg under the tongue every 5 (five) minutes as needed for chest pain.    [provider]  ondansetron (ZOFRAN) 4 MG tablet Take 1 tablet (4 mg total) by mouth 4 (four) times daily as needed for nausea or vomiting. Patient not taking: Reported on 05/28/2018 03/04/18   Wynona Luna, MD  rOPINIRole (REQUIP) 0.25 MG tablet Take 1 tablet (0.25 mg total) by mouth at bedtime. 02/25/18   Bartholomew Crews, MD  sevelamer carbonate (RENVELA) 800 MG tablet Take 4 tablets (3,200 mg total) by mouth 3 (three) times daily with meals. Patient taking differently: Take 1,600-3,200 mg by mouth See admin instructions. Take 4 tablets (3200 mg) by mouth three times  daily with meals and 2 tablets (1600 mg) with snacks 03/12/16   Eulogio Bear U, DO  traMADol (ULTRAM) 50 MG tablet Take 1 tablet (50 mg total) by mouth every 6 (six) hours as needed. Patient taking differently: Take 50 mg by mouth every 6 (six) hours as needed for moderate pain.  05/20/18   Landis Martins, DPM    Family History Family History  Problem Relation Age of Onset  . Bone cancer Mother   . Anuerysm Father   . Hypertension Unknown   . Diabetes type II Daughter     Social History Social History   Tobacco Use  . Smoking status: Former Smoker    Packs/day: 0.30    Years: 8.00    Pack years: 2.40    Types: Cigars  . Smokeless tobacco: Never Used  Substance Use Topics  . Alcohol use: No    Alcohol/week: 0.0 standard drinks  . Drug use: No     Allergies   Enalapril; Latex; and Tape   Review of Systems Review of Systems  Constitutional: Negative for fever.  Respiratory: Positive for shortness of breath. Negative for cough.   Cardiovascular: Negative for chest pain and leg swelling.  Gastrointestinal: Negative for abdominal pain, nausea and vomiting.  Genitourinary: Negative for difficulty urinating and dysuria.  Musculoskeletal: Positive for back pain.  Neurological: Negative for weakness and numbness.  All other systems reviewed and are negative.    Physical Exam Updated Vital Signs BP (!) 151/92   Pulse 93   Temp 98.2 F (36.8 C) (Oral)   Resp (!) 25   Ht 1.829 m (6')   Wt 90.7 kg   SpO2 98%   BMI 27.12 kg/m   Physical Exam  Constitutional: He is oriented to person, place, and time. He appears well-developed and well-nourished. He does not appear ill.  HENT:  Head: Normocephalic and atraumatic.  Eyes: Pupils are equal, round, and reactive to light.  Neck: Neck supple.  Cardiovascular: Normal rate, regular rhythm and normal heart sounds.  No murmur heard. Fistula left upper extremity with positive thrill  Pulmonary/Chest: Effort normal and breath  sounds normal. No respiratory distress. He has no decreased breath sounds. He has no wheezes.  Abdominal: Soft. Bowel sounds are normal. There is no tenderness. There is no rebound.  Musculoskeletal: He exhibits no edema.       Right lower leg: He exhibits no edema.       Left lower leg: He exhibits no edema.  No midline tenderness to palpation, step-off, deformity of the thoracic or lumbar spine  Lymphadenopathy:    He has no cervical adenopathy.  Neurological: He is alert and oriented to person, place, and time.  Skin: Skin is warm and dry.  Psychiatric: He has a normal mood and affect.  Nursing note and vitals reviewed.    ED Treatments / Results  Labs (all labs ordered are listed, but only abnormal results are displayed) Labs Reviewed  CBC WITH DIFFERENTIAL/PLATELET - Abnormal; Notable for the following components:      Result Value   RBC 4.03 (*)    Hemoglobin 10.2 (*)    HCT 33.0 (*)    MCH 25.3 (*)    RDW 19.7 (*)    Eosinophils Absolute 0.8 (*)    All other components within normal limits  BASIC METABOLIC PANEL - Abnormal; Notable for the following components:   Potassium 5.4 (*)    BUN 74 (*)    Creatinine, Ser 14.10 (*)    GFR calc non Af Amer 3 (*)    GFR calc Af Amer 4 (*)    Anion gap 18 (*)    All other components within normal limits  TROPONIN I - Abnormal; Notable for the following components:   Troponin I 0.03 (*)    All other components within normal limits    EKG EKG Interpretation  Date/Time:  Sunday July 06 2018 02:52:28 EDT Ventricular Rate:  93 PR Interval:    QRS Duration: 104 QT Interval:  398 QTC Calculation: 496 R Axis:   84 Text Interpretation:  Sinus rhythm Borderline right axis deviation Low voltage, extremity leads Borderline prolonged QT interval Confirmed by Thayer Jew [id number]) on 07/06/2018 4:21:04 AM   Radiology Dg Chest Portable 1 View  Result Date: 07/06/2018 CLINICAL DATA:  Acute onset shortness of breath while  walking. Hypoxic. EXAM: PORTABLE CHEST 1 VIEW COMPARISON:  Chest radiograph June 06, 2018 and CT chest June 06, 2018. FINDINGS: Stable cardiomegaly. Pulmonary vascular congestion and interstitial prominence similar to prior examination. Worsening LEFT mid and lower lung zone consolidation with moderate LEFT pleural effusion. Mild biapical pleural thickening. No pneumothorax. Status post median sternotomy for cardiac valve replacement. IMPRESSION: 1. Increasing LEFT mid and lower lung zone airspace opacities with moderate LEFT pleural effusion. 2. Stable cardiomegaly and interstitial prominence most compatible with pulmonary edema. Electronically Signed   By: Elon Alas M.D.   On: 07/06/2018 04:46    Procedures Procedures (including critical care time)  Medications Ordered in ED Medications  HYDROcodone-acetaminophen (NORCO/VICODIN) 5-325 MG per tablet 1 tablet (1 tablet Oral Given 07/06/18 0306)     Initial Impression / Assessment and Plan / ED Course  I have reviewed the triage vital signs and the nursing notes.  Pertinent labs & imaging results that were available during my care of the patient were reviewed by me and considered in my medical decision making (see chart for details).     Patient presents with shortness of breath.  Also reports chronic back pain.  He is overall nontoxic-appearing and vital signs are reassuring.  He is not in any respiratory distress.  Lung sounds are clear.  Denies infectious symptoms.  Denies fevers.  He does not appear overtly volume overloaded.  Chest x-ray shows a persistent left pleural effusion.  No pneumonia or pneumothorax.  I have compared to prior and there is no significant change.  Patient had a CT scan of the chest during his last admission which suggested the pleural effusion was likely related to volume.  Patient reports that they have adjusted  his dry weight.  After 1 dose of pain medication, patient states he feels good.  He does not feel  short of breath.  He is able to ambulate and maintain his pulse ox greater than 96%.  Of note, potassium noted to be 5.4 and he had an anion gap of 18.  He has no EKG changes or QRS widening.  He did miss one dialysis session on Friday.  He will likely tolerate this mild elevation in potassium.  Baseline is 4.8.  Potassium prior to surgery 1 month ago was 5.3.  I have stressed to him that it is very important that he get his regularly scheduled dialysis session tomorrow.  He does not meet criteria for emergent dialysis.  Patient stated understanding.  After history, exam, and medical workup I feel the patient has been appropriately medically screened and is safe for discharge home. Pertinent diagnoses were discussed with the patient. Patient was given return precautions.   Final Clinical Impressions(s) / ED Diagnoses   Final diagnoses:  Pleural effusion  Shortness of breath  ESRD (end stage renal disease) [facility])    ED Discharge Orders    None       Dina Rich, Barbette Hair, MD 07/06/18 [phone]

## 2018-07-07 DIAGNOSIS — D509 Iron deficiency anemia, unspecified: Secondary | ICD-10-CM | POA: Diagnosis not present

## 2018-07-07 DIAGNOSIS — N2581 Secondary hyperparathyroidism of renal origin: Secondary | ICD-10-CM | POA: Diagnosis not present

## 2018-07-07 DIAGNOSIS — D631 Anemia in chronic kidney disease: Secondary | ICD-10-CM | POA: Diagnosis not present

## 2018-07-07 DIAGNOSIS — E1129 Type 2 diabetes mellitus with other diabetic kidney complication: Secondary | ICD-10-CM | POA: Diagnosis not present

## 2018-07-07 DIAGNOSIS — N186 End stage renal disease: Secondary | ICD-10-CM | POA: Diagnosis not present

## 2018-07-08 ENCOUNTER — Other Ambulatory Visit (HOSPITAL_COMMUNITY): Payer: Self-pay | Admitting: Nephrology

## 2018-07-08 DIAGNOSIS — E1122 Type 2 diabetes mellitus with diabetic chronic kidney disease: Secondary | ICD-10-CM | POA: Diagnosis not present

## 2018-07-08 DIAGNOSIS — J90 Pleural effusion, not elsewhere classified: Secondary | ICD-10-CM

## 2018-07-08 DIAGNOSIS — N186 End stage renal disease: Secondary | ICD-10-CM | POA: Diagnosis not present

## 2018-07-08 DIAGNOSIS — R52 Pain, unspecified: Secondary | ICD-10-CM

## 2018-07-08 DIAGNOSIS — Z992 Dependence on renal dialysis: Secondary | ICD-10-CM | POA: Diagnosis not present

## 2018-07-08 DIAGNOSIS — R0600 Dyspnea, unspecified: Secondary | ICD-10-CM

## 2018-07-09 ENCOUNTER — Other Ambulatory Visit (HOSPITAL_COMMUNITY): Payer: Self-pay | Admitting: Nephrology

## 2018-07-09 ENCOUNTER — Ambulatory Visit (HOSPITAL_COMMUNITY)
Admission: RE | Admit: 2018-07-09 | Discharge: 2018-07-09 | Disposition: A | Discharge status: Home / Self Care | Payer: Medicare Other | Source: Ambulatory Visit | Attending: Nephrology | Admitting: Nephrology

## 2018-07-09 ENCOUNTER — Encounter (HOSPITAL_COMMUNITY): Payer: Self-pay | Admitting: Radiology

## 2018-07-09 DIAGNOSIS — N2581 Secondary hyperparathyroidism of renal origin: Secondary | ICD-10-CM | POA: Diagnosis not present

## 2018-07-09 DIAGNOSIS — D631 Anemia in chronic kidney disease: Secondary | ICD-10-CM | POA: Diagnosis not present

## 2018-07-09 DIAGNOSIS — R0600 Dyspnea, unspecified: Secondary | ICD-10-CM

## 2018-07-09 DIAGNOSIS — E1129 Type 2 diabetes mellitus with other diabetic kidney complication: Secondary | ICD-10-CM | POA: Diagnosis not present

## 2018-07-09 DIAGNOSIS — J90 Pleural effusion, not elsewhere classified: Secondary | ICD-10-CM | POA: Diagnosis not present

## 2018-07-09 DIAGNOSIS — N186 End stage renal disease: Secondary | ICD-10-CM | POA: Diagnosis not present

## 2018-07-09 DIAGNOSIS — Z23 Encounter for immunization: Secondary | ICD-10-CM | POA: Diagnosis not present

## 2018-07-09 DIAGNOSIS — D509 Iron deficiency anemia, unspecified: Secondary | ICD-10-CM | POA: Diagnosis not present

## 2018-07-09 HISTORY — PX: IR THORACENTESIS ASP PLEURAL SPACE W/IMG GUIDE: IMG5380

## 2018-07-09 MED ORDER — LIDOCAINE HCL 1 % IJ SOLN
INTRAMUSCULAR | Status: AC
  Filled 2018-07-09: qty 20

## 2018-07-09 MED ORDER — LIDOCAINE HCL 1 % IJ SOLN
INTRAMUSCULAR | Status: AC | PRN
  Administered 2018-07-09: 10 mL

## 2018-07-09 NOTE — Procedures (Signed)
PROCEDURE SUMMARY:  Successful US guided left thoracentesis. Yielded 1.3 L of serosanguinous fluid. Pt tolerated procedure well. No immediate complications.  Specimen was not sent for labs. CXR ordered.  Ascencion Dike PA-C 07/09/2018 1:49 PM

## 2018-07-10 ENCOUNTER — Other Ambulatory Visit: Payer: Self-pay

## 2018-07-10 ENCOUNTER — Ambulatory Visit (INDEPENDENT_AMBULATORY_CARE_PROVIDER_SITE_OTHER): Payer: Medicare Other | Admitting: Internal Medicine

## 2018-07-10 ENCOUNTER — Encounter: Payer: Self-pay | Admitting: Internal Medicine

## 2018-07-10 VITALS — BP 170/81 | HR 100 | Temp 98.2°F | Ht 72.0 in | Wt 205.6 lb

## 2018-07-10 DIAGNOSIS — Z955 Presence of coronary angioplasty implant and graft: Secondary | ICD-10-CM | POA: Diagnosis not present

## 2018-07-10 DIAGNOSIS — E1142 Type 2 diabetes mellitus with diabetic polyneuropathy: Secondary | ICD-10-CM

## 2018-07-10 DIAGNOSIS — Z8639 Personal history of other endocrine, nutritional and metabolic disease: Secondary | ICD-10-CM

## 2018-07-10 DIAGNOSIS — I2510 Atherosclerotic heart disease of native coronary artery without angina pectoris: Secondary | ICD-10-CM

## 2018-07-10 DIAGNOSIS — Z79899 Other long term (current) drug therapy: Secondary | ICD-10-CM | POA: Diagnosis not present

## 2018-07-10 DIAGNOSIS — I10 Essential (primary) hypertension: Secondary | ICD-10-CM | POA: Diagnosis not present

## 2018-07-10 DIAGNOSIS — Z87898 Personal history of other specified conditions: Secondary | ICD-10-CM

## 2018-07-10 DIAGNOSIS — I151 Hypertension secondary to other renal disorders: Secondary | ICD-10-CM

## 2018-07-10 DIAGNOSIS — Z7982 Long term (current) use of aspirin: Secondary | ICD-10-CM | POA: Diagnosis not present

## 2018-07-10 DIAGNOSIS — M25511 Pain in right shoulder: Secondary | ICD-10-CM

## 2018-07-10 DIAGNOSIS — R0789 Other chest pain: Secondary | ICD-10-CM

## 2018-07-10 DIAGNOSIS — N2889 Other specified disorders of kidney and ureter: Principal | ICD-10-CM

## 2018-07-10 DIAGNOSIS — Z87891 Personal history of nicotine dependence: Secondary | ICD-10-CM

## 2018-07-10 DIAGNOSIS — E1140 Type 2 diabetes mellitus with diabetic neuropathy, unspecified: Secondary | ICD-10-CM

## 2018-07-10 DIAGNOSIS — Z9861 Coronary angioplasty status: Secondary | ICD-10-CM

## 2018-07-10 HISTORY — DX: Pain in right shoulder: M25.511

## 2018-07-10 LAB — GLUCOSE, CAPILLARY: Glucose-Capillary: 99 mg/dL (ref 70–99)

## 2018-07-10 MED ORDER — ISOSORBIDE MONONITRATE ER 30 MG PO TB24: 30.0000 mg | ORAL_TABLET | Freq: Every day | ORAL | 11 refills | Status: AC

## 2018-07-10 MED ORDER — NITROGLYCERIN 0.4 MG SL SUBL: 0.4000 mg | SUBLINGUAL_TABLET | SUBLINGUAL | 0 refills | Status: AC | PRN

## 2018-07-10 MED ORDER — LISINOPRIL 10 MG PO TABS: 10.0000 mg | ORAL_TABLET | Freq: Every day | ORAL | 3 refills | Status: DC

## 2018-07-10 NOTE — Progress Notes (Signed)
   CC: Hypertension, CAD, Back Pain  HPI:   [name] is a 62 y.o. M with PMHx listed below presenting for Hypertension, CAD, Back Pain. Please see the A&P for the status of the patient's chronic medical problems.   Past Medical History:  Diagnosis Date  . Anemia   . Anginal pain ([place])   . CAD (coronary artery disease)    a. per [facility] s/p 3.100mm x 68mm Vision BMS to mid LAD 12/2009 and Xience DES to mid LAD 10/2010.  [name] Chronic diastolic CHF (congestive heart failure) ([place])   . Colon polyps   . Daily headache   . ESRD on dialysis [facility]) since ~ 2008   "MWF; Jeneen Rinks" (03/04/2017)  . GERD (gastroesophageal reflux disease)   . H/O TIA (transient ischemic attack) 04/01/2015  . H/O TIA (transient ischemic attack) 04/01/2015  . Heart murmur   . Hematochezia    a. 2014: colonscopy, which showed moderately-sized internal hemorrhoids, two 31mm polyps in transverse colon and ascending colon that were resected, five 2-63mm polyps in sigmoid colon, descending colon, transverse colon, and ascending colon that were resected. An upper endoscopy was performed and showed normal esophagus, stomach, and duodenum.  . Hematuria    a. H/o hematuria 2014 with cystoscopy that was unrevealing for his source of hematuria. He underwent a kidney ultrasound on 10/14 that showed mildly echogenic and scarred kidneys compatible with medical renal disease, without hydronephrosis or renal calculi.  [name] History of blood transfusion    "had colonoscopy done; they had to give me some blood"  . History of kidney stones   . Hyperlipidemia   . Hypertension   . On home oxygen therapy    "2L prn" (07/21/2015); "been off it for awhile" (03/04/2017)  . Pneumonia   . Renal insufficiency   . 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 Type II diabetes mellitus ([place])   . Wears dentures    Review of Systems: Performed and all others negative.  Physical Exam:  Vitals:   07/10/18 1449  BP:  (!) 170/81  Pulse: 100  Temp: 98.2 F (36.8 C)  TempSrc: Oral  SpO2: 94%  Weight: 205 lb 9.6 oz (93.3 kg)  Height: 6' (1.829 m)   Physical Exam  Constitutional: He appears well-developed and well-nourished.  Cardiovascular: Normal rate, regular rhythm, normal heart sounds and intact distal pulses.  Pulmonary/Chest: Effort normal and breath sounds normal. No respiratory distress.  Abdominal: Soft. Bowel sounds are normal. He exhibits no distension. There is no tenderness.  Musculoskeletal: He exhibits no edema or deformity.  Pain on palpation of musculature overlying R scapula. Pain with abduction greater than 120 degrees at R shoulder.  Skin: Skin is warm and dry.    Assessment & Plan:   See Encounters Tab for problem based charting.  Patient discussed with Dr. Rebeca Alert

## 2018-07-10 NOTE — Patient Instructions (Signed)
Thank you for allowing [place] to care for you  For you blood pressure - BP elevated today - We have added Lisinopril to your medications - If you experience rash or difficulty breath please stop taking this medication, you have an allergy listed although you don't remember this reaction  For you back pain - We have provided a new referral to PT  For you neuropathy - We will evaluate your history of being on lyrica and may restart  Please follow up in about 3 months

## 2018-07-11 DIAGNOSIS — Z23 Encounter for immunization: Secondary | ICD-10-CM | POA: Diagnosis not present

## 2018-07-11 DIAGNOSIS — N186 End stage renal disease: Secondary | ICD-10-CM | POA: Diagnosis not present

## 2018-07-11 DIAGNOSIS — N2581 Secondary hyperparathyroidism of renal origin: Secondary | ICD-10-CM | POA: Diagnosis not present

## 2018-07-11 DIAGNOSIS — D509 Iron deficiency anemia, unspecified: Secondary | ICD-10-CM | POA: Diagnosis not present

## 2018-07-11 DIAGNOSIS — E1129 Type 2 diabetes mellitus with other diabetic kidney complication: Secondary | ICD-10-CM | POA: Diagnosis not present

## 2018-07-11 DIAGNOSIS — D631 Anemia in chronic kidney disease: Secondary | ICD-10-CM | POA: Diagnosis not present

## 2018-07-11 LAB — POCT GLYCOSYLATED HEMOGLOBIN (HGB A1C): HEMOGLOBIN A1C: 4.8 % (ref 4.0–5.6)

## 2018-07-12 ENCOUNTER — Encounter: Payer: Self-pay | Admitting: Internal Medicine

## 2018-07-12 NOTE — Assessment & Plan Note (Signed)
Patient report ongoing right upper back pain. On exam pain is located at right scapula and is associated with pain on abduction. He has had this pain for over a year. He has failed tylenol and states tramadol does not help. He was seen for this previously and was referred to PT, but did not complete this referral. Etiology of pain unclear, will monitor for improvement with PT. - New referral for physical therapy placed - Follow up in 3-6 months.

## 2018-07-12 NOTE — Assessment & Plan Note (Signed)
Refills provided for IMDUR and Nitro. Patient to follow up with cardiologist next week.

## 2018-07-12 NOTE — Assessment & Plan Note (Addendum)
BP 170/81 today. Patient denies headache, vision changes, or other concerning symptoms. He states BP has been elevated at HD as well. Will add Lisinopril to his regimen. Patient instructed to also discuss this change with his nephrologist. - Amlodipine 10mg  Daily - Start Lisinopril 10mg  Daily

## 2018-07-12 NOTE — Assessment & Plan Note (Signed)
Patient complains of ongoing numbness and tingling in his lower extremities. He asks about a potential refill for his Lyrica. Upon chart review, this was discontinued after an episode of AMS leading to hospital admission, as it was believed to be an contributing factor. I am hesitant restart this medication at his time. Will consider alternatives at follow up. - Will consider alternatives at follow up visit.

## 2018-07-14 DIAGNOSIS — E1129 Type 2 diabetes mellitus with other diabetic kidney complication: Secondary | ICD-10-CM | POA: Diagnosis not present

## 2018-07-14 DIAGNOSIS — N186 End stage renal disease: Secondary | ICD-10-CM | POA: Diagnosis not present

## 2018-07-14 DIAGNOSIS — N2581 Secondary hyperparathyroidism of renal origin: Secondary | ICD-10-CM | POA: Diagnosis not present

## 2018-07-14 DIAGNOSIS — D631 Anemia in chronic kidney disease: Secondary | ICD-10-CM | POA: Diagnosis not present

## 2018-07-14 DIAGNOSIS — Z23 Encounter for immunization: Secondary | ICD-10-CM | POA: Diagnosis not present

## 2018-07-14 DIAGNOSIS — D509 Iron deficiency anemia, unspecified: Secondary | ICD-10-CM | POA: Diagnosis not present

## 2018-07-16 DIAGNOSIS — N2581 Secondary hyperparathyroidism of renal origin: Secondary | ICD-10-CM | POA: Diagnosis not present

## 2018-07-16 DIAGNOSIS — D631 Anemia in chronic kidney disease: Secondary | ICD-10-CM | POA: Diagnosis not present

## 2018-07-16 DIAGNOSIS — N186 End stage renal disease: Secondary | ICD-10-CM | POA: Diagnosis not present

## 2018-07-16 DIAGNOSIS — E1129 Type 2 diabetes mellitus with other diabetic kidney complication: Secondary | ICD-10-CM | POA: Diagnosis not present

## 2018-07-16 DIAGNOSIS — Z23 Encounter for immunization: Secondary | ICD-10-CM | POA: Diagnosis not present

## 2018-07-16 DIAGNOSIS — D509 Iron deficiency anemia, unspecified: Secondary | ICD-10-CM | POA: Diagnosis not present

## 2018-07-18 ENCOUNTER — Other Ambulatory Visit: Payer: Self-pay

## 2018-07-18 ENCOUNTER — Ambulatory Visit (INDEPENDENT_AMBULATORY_CARE_PROVIDER_SITE_OTHER)
Admission: EM | Admit: 2018-07-18 | Discharge: 2018-07-18 | Disposition: A | Discharge status: Home / Self Care | Payer: Medicare Other | Source: Home / Self Care

## 2018-07-18 ENCOUNTER — Emergency Department (HOSPITAL_COMMUNITY): Payer: Medicare Other

## 2018-07-18 ENCOUNTER — Encounter (HOSPITAL_COMMUNITY): Payer: Self-pay

## 2018-07-18 ENCOUNTER — Emergency Department (HOSPITAL_COMMUNITY)
Admission: EM | Admit: 2018-07-18 | Discharge: 2018-07-18 | Discharge status: Against Medical Advice | Payer: Medicare Other | Source: Home / Self Care | Attending: Emergency Medicine | Admitting: Emergency Medicine

## 2018-07-18 DIAGNOSIS — Z87891 Personal history of nicotine dependence: Secondary | ICD-10-CM

## 2018-07-18 DIAGNOSIS — R0602 Shortness of breath: Secondary | ICD-10-CM | POA: Diagnosis not present

## 2018-07-18 DIAGNOSIS — I2510 Atherosclerotic heart disease of native coronary artery without angina pectoris: Secondary | ICD-10-CM | POA: Insufficient documentation

## 2018-07-18 DIAGNOSIS — E1129 Type 2 diabetes mellitus with other diabetic kidney complication: Secondary | ICD-10-CM | POA: Diagnosis not present

## 2018-07-18 DIAGNOSIS — I132 Hypertensive heart and chronic kidney disease with heart failure and with stage 5 chronic kidney disease, or end stage renal disease: Secondary | ICD-10-CM

## 2018-07-18 DIAGNOSIS — E119 Type 2 diabetes mellitus without complications: Secondary | ICD-10-CM

## 2018-07-18 DIAGNOSIS — I517 Cardiomegaly: Secondary | ICD-10-CM | POA: Diagnosis not present

## 2018-07-18 DIAGNOSIS — Z79899 Other long term (current) drug therapy: Secondary | ICD-10-CM | POA: Insufficient documentation

## 2018-07-18 DIAGNOSIS — Z992 Dependence on renal dialysis: Secondary | ICD-10-CM | POA: Insufficient documentation

## 2018-07-18 DIAGNOSIS — R42 Dizziness and giddiness: Secondary | ICD-10-CM

## 2018-07-18 DIAGNOSIS — Z951 Presence of aortocoronary bypass graft: Secondary | ICD-10-CM | POA: Insufficient documentation

## 2018-07-18 DIAGNOSIS — Z91040 Latex allergy status: Secondary | ICD-10-CM

## 2018-07-18 DIAGNOSIS — R079 Chest pain, unspecified: Secondary | ICD-10-CM

## 2018-07-18 DIAGNOSIS — R0789 Other chest pain: Secondary | ICD-10-CM | POA: Insufficient documentation

## 2018-07-18 DIAGNOSIS — D509 Iron deficiency anemia, unspecified: Secondary | ICD-10-CM | POA: Diagnosis not present

## 2018-07-18 DIAGNOSIS — N186 End stage renal disease: Secondary | ICD-10-CM | POA: Insufficient documentation

## 2018-07-18 DIAGNOSIS — I5032 Chronic diastolic (congestive) heart failure: Secondary | ICD-10-CM

## 2018-07-18 DIAGNOSIS — Z23 Encounter for immunization: Secondary | ICD-10-CM | POA: Diagnosis not present

## 2018-07-18 DIAGNOSIS — N2581 Secondary hyperparathyroidism of renal origin: Secondary | ICD-10-CM | POA: Diagnosis not present

## 2018-07-18 DIAGNOSIS — E11649 Type 2 diabetes mellitus with hypoglycemia without coma: Secondary | ICD-10-CM | POA: Diagnosis not present

## 2018-07-18 DIAGNOSIS — D631 Anemia in chronic kidney disease: Secondary | ICD-10-CM | POA: Diagnosis not present

## 2018-07-18 LAB — POCT I-STAT, CHEM 8
BUN: 13 mg/dL (ref 6–20)
CREATININE: 4.5 mg/dL — AB (ref 0.61–1.24)
Calcium, Ion: 0.88 mmol/L — CL (ref 1.15–1.40)
Chloride: 91 mmol/L — ABNORMAL LOW (ref 98–111)
Glucose, Bld: 87 mg/dL (ref 70–99)
HEMATOCRIT: 35 % — AB (ref 39.0–52.0)
Hemoglobin: 11.9 g/dL — ABNORMAL LOW (ref 13.0–17.0)
POTASSIUM: 3.8 mmol/L (ref 3.5–5.1)
Sodium: 137 mmol/L (ref 135–145)
TCO2: 35 mmol/L — AB (ref 22–32)

## 2018-07-18 LAB — BASIC METABOLIC PANEL
Anion gap: 12 (ref 5–15)
BUN: 14 mg/dL (ref 6–20)
CO2: 31 mmol/L (ref 22–32)
Calcium: 8.1 mg/dL — ABNORMAL LOW (ref 8.9–10.3)
Chloride: 92 mmol/L — ABNORMAL LOW (ref 98–111)
Creatinine, Ser: 4.67 mg/dL — ABNORMAL HIGH (ref 0.61–1.24)
GFR calc Af Amer: 14 mL/min — ABNORMAL LOW (ref >60)
GFR calc non Af Amer: 12 mL/min — ABNORMAL LOW (ref >60)
Glucose, Bld: 113 mg/dL — ABNORMAL HIGH (ref 70–99)
Potassium: 3.7 mmol/L (ref 3.5–5.1)
Sodium: 135 mmol/L (ref 135–145)

## 2018-07-18 LAB — CBC
HCT: 32.6 % — ABNORMAL LOW (ref 39.0–52.0)
Hemoglobin: 10.1 g/dL — ABNORMAL LOW (ref 13.0–17.0)
MCH: 25.3 pg — ABNORMAL LOW (ref 26.0–34.0)
MCHC: 31 g/dL (ref 30.0–36.0)
MCV: 81.5 fL (ref 80.0–100.0)
Platelets: 202 10*3/uL (ref 150–400)
RBC: 4 MIL/uL — ABNORMAL LOW (ref 4.22–5.81)
RDW: 18.4 % — ABNORMAL HIGH (ref 11.5–15.5)
WBC: 7.4 10*3/uL (ref 4.0–10.5)
nRBC: 0 % (ref 0.0–0.2)

## 2018-07-18 LAB — GLUCOSE, CAPILLARY: Glucose-Capillary: 71 mg/dL (ref 70–99)

## 2018-07-18 LAB — I-STAT TROPONIN, ED: Troponin i, poc: 0.06 ng/mL (ref 0.00–0.08)

## 2018-07-18 NOTE — Progress Notes (Signed)
Internal Medicine Clinic Attending  Case discussed with Dr. Melvin at the time of the visit.  We reviewed the resident's history and exam and pertinent patient test results.  I agree with the assessment, diagnosis, and plan of care documented in the resident's note.  Alexander Raines, M.D., Ph.D.  

## 2018-07-18 NOTE — Discharge Instructions (Addendum)
The lab work we did here was normal Your symptoms have improved since this morning Some of the symptoms that you are feeling her typical after dialysis treatment. There are no concerning signs on your exam If you develop the symptoms again or worsening symptoms please go to the ER

## 2018-07-18 NOTE — ED Provider Notes (Signed)
[facility] EMERGENCY DEPARTMENT Provider Note   CSN: [id number] Arrival date & time: 07/18/18  1544     History   Chief Complaint Chief Complaint  Patient presents with  . Shortness of Breath    HPI [name] [name] is a 62 y.o. male with past medical history of ESRD on dialysis MWF, TIA, CHF, CAD, hypertension, type 2 diabetes, presenting to the emergency department with complaint of feeling short of breath, weak, nauseous, and clammy after dialysis today.  He states he received a full treatment though upon leaving he felt the symptoms.  He reports to urgent care though they recommended he be evaluated in the ED.  They checked his blood sugar which was 71.  He states the symptoms have resolved, with the exception of mild shortness of breath, which is not new for him.  He has had multiple episodes similar to this in the past following dialysis treatment though presented to the ED to make sure he was all right.  He states he feels well now and is ready to go home.  Denies symptoms of fluid overload, or other complaints.  The history is provided by the patient.    Past Medical History:  Diagnosis Date  . Anemia   . Anginal pain ([place])   . CAD (coronary artery disease)    a. per [facility] s/p 3.31mm x 29mm Vision BMS to mid LAD 12/2009 and Xience DES to mid LAD 10/2010.  [name] Chronic diastolic CHF (congestive heart failure) ([place])   . Colon polyps   . Daily headache   . ESRD on dialysis [facility]) since ~ 2008   "MWF; Jeneen Rinks" (03/04/2017)  . GERD (gastroesophageal reflux disease)   . H/O TIA (transient ischemic attack) 04/01/2015  . H/O TIA (transient ischemic attack) 04/01/2015  . Heart murmur   . Hematochezia    a. 2014: colonscopy, which showed moderately-sized internal hemorrhoids, two 55mm polyps in transverse colon and ascending colon that were resected, five 2-23mm polyps in sigmoid colon, descending colon, transverse colon, and ascending colon that were resected.  An upper endoscopy was performed and showed normal esophagus, stomach, and duodenum.  . Hematuria    a. H/o hematuria 2014 with cystoscopy that was unrevealing for his source of hematuria. He underwent a kidney ultrasound on 10/14 that showed mildly echogenic and scarred kidneys compatible with medical renal disease, without hydronephrosis or renal calculi.  [name] History of blood transfusion    "had colonoscopy done; they had to give me some blood"  . History of kidney stones   . Hyperlipidemia   . Hypertension   . On home oxygen therapy    "2L prn" (07/21/2015); "been off it for awhile" (03/04/2017)  . Pneumonia   . Renal insufficiency   . 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 Type II diabetes mellitus ([place])   . Wears dentures     Patient Active Problem List   Diagnosis Date Noted  . Shoulder pain, right 07/10/2018  . Pleural effusion on left   . ESRD on dialysis ([place])   . SOB (shortness of breath) 06/06/2018  . Critical lower limb ischemia 05/27/2018  . Hx of CABG 02/02/2018  . Hypervolemia associated with renal insufficiency 01/26/2018  . Hyperkalemia 01/26/2018  . Restless leg syndrome 01/14/2018  . Anemia 01/14/2018  . Tobacco use 10/29/2017  . Mild cognitive impairment 06/13/2017  . Chest pain 06/07/2017  . Skin ulcer of toe of right  foot, limited to breakdown of skin ([place])   . Callus of foot 03/07/2017  . Pulmonary hypertension ([place]) 07/28/2016  . Stroke-like symptoms 06/30/2016  . Peripheral neuropathy 05/07/2016  . S/P coronary artery stent placement 05/07/2016  . Nausea and vomiting 04/02/2016  . ESRD (end stage renal disease) ([place]) 03/18/2016  . Mitral regurgitation   . Diabetic neuropathy ([place]) 07/29/2015  . Depression 07/22/2015  . GERD (gastroesophageal reflux disease) 06/04/2015  . Malnutrition of moderate degree ([place]) 05/17/2015  . Thrombocytopenia ([place]) 05/16/2015  . Weight loss 05/16/2015  . Diastolic dysfunction-grade 2  12/13/2014  . HCAP (healthcare-associated pneumonia) 10/01/2014  . 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 Arm pain, right 05/25/2014  . Numbness 05/25/2014  . Hypertension 04/27/2014  . CAD -S/P LAD BMS 2011, LAD DES 2012- patent cors Feb 2016 04/27/2014  . Diabetes mellitus type 2, diet-controlled ([place]) 04/27/2014  . Background diabetic retinopathy ([place]) 08/13/2012  . Primary localized osteoarthrosis, lower leg 04/15/2012  . Senile nuclear sclerosis 11/29/2010  . Vitreous hemorrhage ([place]) 11/29/2010  . Atherosclerotic heart disease of native coronary artery without angina pectoris 12/12/2009  . Hypercholesterolemia 11/30/2003    Past Surgical History:  Procedure Laterality Date  . ABDOMINAL AORTOGRAM W/LOWER EXTREMITY N/A 05/29/2018   Procedure: ABDOMINAL AORTOGRAM W/LOWER EXTREMITY;  Surgeon: Lorretta Harp, MD;  Location: [facility] CV LAB;  Service: Cardiovascular;  Laterality: N/A;  . AV FISTULA PLACEMENT Left ~ 2007   "upper arm"  . CARDIAC CATHETERIZATION  "several"  . CORONARY ANGIOPLASTY WITH STENT PLACEMENT  "several"  . CORONARY ARTERY BYPASS GRAFT     3 grafts  . CYSTOSCOPY W/ STONE MANIPULATION  X2?  . EYE SURGERY Bilateral    "laser OR for hemorrhage"  . IR THORACENTESIS ASP PLEURAL SPACE W/IMG GUIDE  07/09/2018  . LEFT HEART CATHETERIZATION WITH CORONARY ANGIOGRAM N/A 11/23/2014   Procedure: LEFT HEART CATHETERIZATION WITH CORONARY ANGIOGRAM;  Surgeon: Troy Sine, MD;  Location: [facility] CATH LAB;  Service: Cardiovascular;  Laterality: N/A;  . LITHOTRIPSY  X1  . MITRAL VALVE REPLACEMENT    . REVISON OF ARTERIOVENOUS FISTULA Left 85/27/7824   Procedure: PLICATION OF LEFT ARM ARTERIOVENOUS FISTULA;  Surgeon: Angelia Mould, MD;  Location: [facility];  Service: Vascular;  Laterality: Left;  . REVISON OF ARTERIOVENOUS FISTULA Left 2/35/3614   Procedure: PLICATE ANEURYSM  OF LEFT ARTERIOVENOUS FISTULA;  Surgeon: Waynetta Sandy, MD;  Location: [facility];  Service: Vascular;  Laterality: Left;        Home Medications    Prior to Admission medications   Medication Sig Start Date End Date Taking? Authorizing Provider  albuterol (PROVENTIL HFA;VENTOLIN HFA) 108 (90 Base) MCG/ACT inhaler Inhale 2 puffs into the lungs every 6 (six) hours as needed for wheezing or shortness of breath. 06/07/18  Yes Neva Seat, MD  amLODipine (NORVASC) 10 MG tablet Take 1 tablet (10 mg total) by mouth at bedtime. 04/17/18  Yes Neva Seat, MD  aspirin EC 81 MG tablet Take 81 mg by mouth daily.   Yes [provider]  atorvastatin (LIPITOR) 20 MG tablet Take 20 mg by mouth at bedtime.    Yes [provider]  cinacalcet (SENSIPAR) 90 MG tablet Take 180 mg by mouth every Monday, Wednesday, and Friday with hemodialysis.    Yes [provider]  isosorbide mononitrate (IMDUR) 30 MG 24 hr tablet Take 1 tablet (30 mg total) by mouth daily. 07/10/18 07/10/19 Yes Neva Seat, MD  lisinopril (PRINIVIL,ZESTRIL) 10 MG tablet Take  1 tablet (10 mg total) by mouth daily. 07/10/18  Yes Neva Seat, MD  multivitamin (RENA-VIT) TABS tablet TAKE 1 TABLET BY MOUTH ONCE DAILY AT BEDTIME 07/02/18  Yes Neva Seat, MD  ondansetron [facility]) 4 MG tablet Take 1 tablet (4 mg total) by mouth 4 (four) times daily as needed for nausea or vomiting. 03/04/18  Yes Wynona Luna, MD  sevelamer carbonate (RENVELA) 800 MG tablet Take 4 tablets (3,200 mg total) by mouth 3 (three) times daily with meals. Patient taking differently: Take 1,600-3,200 mg by mouth See admin instructions. Take 4 tablets (3200 mg) by mouth three times daily with meals and 2 tablets (1600 mg) with snacks 03/12/16  Yes Vann, Jessica U, DO  cadexomer iodine (IODOSORB) 0.9 % gel Apply 1 application topically daily as needed for wound care. Patient not taking: Reported on 05/27/2018 05/06/18   Landis Martins, DPM  nitroGLYCERIN (NITROSTAT) 0.4 MG SL tablet Place 1 tablet (0.4  mg total) under the tongue every 5 (five) minutes as needed for chest pain. 07/10/18   Neva Seat, MD  rOPINIRole (REQUIP) 0.25 MG tablet Take 1 tablet (0.25 mg total) by mouth at bedtime. Patient not taking: Reported on 07/18/2018 02/25/18   Bartholomew Crews, MD  traMADol (ULTRAM) 50 MG tablet Take 1 tablet (50 mg total) by mouth every 6 (six) hours as needed. Patient not taking: Reported on 07/18/2018 05/20/18   Landis Martins, DPM    Family History Family History  Problem Relation Age of Onset  . Bone cancer Mother   . Anuerysm Father   . Hypertension Unknown   . Diabetes type II Daughter     Social History Social History   Tobacco Use  . Smoking status: Former Smoker    Packs/day: 0.30    Years: 8.00    Pack years: 2.40    Types: Cigars  . Smokeless tobacco: Never Used  Substance Use Topics  . Alcohol use: No    Alcohol/week: 0.0 standard drinks  . Drug use: No     Allergies   Enalapril; Latex; and Tape   Review of Systems Review of Systems  Constitutional: Positive for diaphoresis. Negative for fever.  Respiratory: Positive for shortness of breath.   Cardiovascular: Positive for chest pain.  Gastrointestinal: Positive for nausea.  All other systems reviewed and are negative.    Physical Exam Updated Vital Signs BP (!) 169/86   Pulse 96   Temp 99.3 F (37.4 C) (Oral)   Resp 19   SpO2 93%   Physical Exam  Constitutional: He is oriented to person, place, and time. He appears well-developed and well-nourished.  HENT:  Head: Normocephalic and atraumatic.  Eyes: Conjunctivae are normal.  Neck: Normal range of motion.  Cardiovascular: Normal rate, regular rhythm, normal heart sounds and intact distal pulses.  Pulmonary/Chest: Effort normal and breath sounds normal. No respiratory distress.  Abdominal: Soft. Bowel sounds are normal. There is no tenderness. There is no guarding.  Musculoskeletal:  No LE edema  Neurological: He is alert and  oriented to person, place, and time.  Mental Status:  Alert, oriented, thought content appropriate, able to give a coherent history. Speech fluent without evidence of aphasia. Able to follow 2 step commands without difficulty.  Cranial Nerves:  II:  Peripheral visual fields grossly normal, pupils equal, round, reactive to light III,IV, VI: ptosis not present, extra-ocular motions intact bilaterally  V,VII: smile symmetric, facial light touch sensation equal VIII: hearing grossly normal to voice  X: uvula elevates  symmetrically  XI: bilateral shoulder shrug symmetric and strong XII: midline tongue extension without fassiculations Motor:  Normal tone. 5/5 in upper and lower extremities bilaterally including strong and equal grip strength and dorsiflexion/plantar flexion Sensory: Pinprick and light touch normal in all extremities.  Deep Tendon Reflexes: 2+ and symmetric in the biceps and patella Cerebellar: normal finger-to-nose with bilateral upper extremities Gait: normal gait and balance CV: distal pulses palpable throughout    Skin: Skin is warm.  Psychiatric: He has a normal mood and affect. His behavior is normal.  Nursing note and vitals reviewed.    ED Treatments / Results  Labs (all labs ordered are listed, but only abnormal results are displayed) Labs Reviewed  BASIC METABOLIC PANEL - Abnormal; Notable for the following components:      Result Value   Chloride 92 (*)    Glucose, Bld 113 (*)    Creatinine, Ser 4.67 (*)    Calcium 8.1 (*)    GFR calc non Af Amer 12 (*)    GFR calc Af Amer 14 (*)    All other components within normal limits  CBC - Abnormal; Notable for the following components:   RBC 4.00 (*)    Hemoglobin 10.1 (*)    HCT 32.6 (*)    MCH 25.3 (*)    RDW 18.4 (*)    All other components within normal limits  I-STAT TROPONIN, ED    EKG None  Radiology Dg Chest 2 View  Result Date: 07/18/2018 CLINICAL DATA:  Shortness of breath EXAM: CHEST - 2  VIEW COMPARISON:  July 09, 2018 FINDINGS: There is patchy atelectatic change in the left lower lobe with loculated left pleural effusion. Lungs elsewhere clear. Heart is mildly enlarged with pulmonary venous hypertension. No adenopathy. There is aortic atherosclerosis. Patient is status post coronary artery bypass grafting and mitral valve replacement. No bone lesions. IMPRESSION: Loculated left pleural effusion with patchy atelectasis in the left lower lung region. There is pulmonary vascular congestion. There is aortic atherosclerosis. Status post coronary artery bypass grafting and mitral valve replacement. Aortic Atherosclerosis (ICD10-I70.0). Electronically Signed   By: Lowella Grip III M.D.   On: 07/18/2018 16:11    Procedures Procedures (including critical care time)  Medications Ordered in ED Medications - No data to display   Initial Impression / Assessment and Plan / ED Course  I have reviewed the triage vital signs and the nursing notes.  Pertinent labs & imaging results that were available during my care of the patient were reviewed by me and considered in my medical decision making (see chart for details).  Clinical Course as of Jul 18 1821  Fri Jul 18, 2018  1649 Pt discussed with Dr. Wilson Singer, Plan for delta trop   [JR]  1739 Patient deciding to leave prior to delta troponin.  Discussed  risks of leaving. Low suspicion for ACS as patient reports multiple similar episodes of these symptoms following dialysis.  Pt is well-appearing prior to leaving.  Discussed strict return precautions.   [JR]    Clinical Course User Index [JR] Catheleen Langhorne, [place] N, PA-C    Patient presenting to the ED after finishing dialysis with episode of chest pain, shortness of breath, nausea and clamminess.  He reports multiple similar episodes in the past following dialysis and this particular day is no different.  He reports to the ED just for evaluation though denies complaint.  On evaluation he is  well-appearing, normal neurologic exam.  Initial troponin is within normal  limits.  EKG is nonischemic.  Labs appear to be at baseline.  Chest x-ray with progression of pleural effusion.  Plan to check delta troponin, though patient deciding he feels well and ready to leave.  Recommended patient stay in the ED for further evaluation though patient declining.  Discussed risks of leaving prior to completion of evaluation and patient verbalized understanding of risks.  Discussed strict return precautions.  We discussed the nature and purpose, risks and benefits, as well as, the alternatives of treatment. Time was given to allow the opportunity to ask questions and consider their options, and after the discussion, the patient decided to refuse the offerred treatment. The patient was informed that refusal could lead to, but was not limited to, death, permanent disability, or severe pain. If present, I asked the relatives or significant others to dissuade them without success. Prior to refusing, I determined that the patient had the capacity to make their decision and understood the consequences of that decision. After refusal, I made every reasonable opportunity to treat them to the best of my ability.  The patient was notified that they may return to the emergency department at any time for further treatment.    Final Clinical Impressions(s) / ED Diagnoses   Final diagnoses:  Shortness of breath  Central chest pain    ED Discharge Orders    None       Shanikka Wonders, [place] N, PA-C 07/18/18 Candie Chroman, MD 07/18/18 2314

## 2018-07-18 NOTE — ED Triage Notes (Signed)
Pt presents with shortness of breath, fatigue and nausea/clammy after dialysis today. Pt received full treatment.

## 2018-07-18 NOTE — ED Triage Notes (Signed)
Pt presents feeling fatigue, clammy and nausea following dialysis.

## 2018-07-19 ENCOUNTER — Emergency Department (HOSPITAL_COMMUNITY)
Admission: EM | Admit: 2018-07-19 | Discharge: 2018-07-20 | Disposition: A | Discharge status: Home / Self Care | Payer: Medicare Other | Source: Home / Self Care | Attending: Emergency Medicine | Admitting: Emergency Medicine

## 2018-07-19 ENCOUNTER — Other Ambulatory Visit: Payer: Self-pay

## 2018-07-19 DIAGNOSIS — E11649 Type 2 diabetes mellitus with hypoglycemia without coma: Secondary | ICD-10-CM | POA: Diagnosis not present

## 2018-07-19 DIAGNOSIS — I509 Heart failure, unspecified: Secondary | ICD-10-CM | POA: Insufficient documentation

## 2018-07-19 DIAGNOSIS — Z7982 Long term (current) use of aspirin: Secondary | ICD-10-CM | POA: Insufficient documentation

## 2018-07-19 DIAGNOSIS — R0902 Hypoxemia: Secondary | ICD-10-CM | POA: Diagnosis not present

## 2018-07-19 DIAGNOSIS — E162 Hypoglycemia, unspecified: Secondary | ICD-10-CM

## 2018-07-19 DIAGNOSIS — I517 Cardiomegaly: Secondary | ICD-10-CM | POA: Diagnosis not present

## 2018-07-19 DIAGNOSIS — Z91040 Latex allergy status: Secondary | ICD-10-CM

## 2018-07-19 DIAGNOSIS — R231 Pallor: Secondary | ICD-10-CM | POA: Diagnosis not present

## 2018-07-19 DIAGNOSIS — N186 End stage renal disease: Secondary | ICD-10-CM | POA: Insufficient documentation

## 2018-07-19 DIAGNOSIS — E11319 Type 2 diabetes mellitus with unspecified diabetic retinopathy without macular edema: Secondary | ICD-10-CM

## 2018-07-19 DIAGNOSIS — Z951 Presence of aortocoronary bypass graft: Secondary | ICD-10-CM | POA: Insufficient documentation

## 2018-07-19 DIAGNOSIS — Z87891 Personal history of nicotine dependence: Secondary | ICD-10-CM

## 2018-07-19 DIAGNOSIS — E1122 Type 2 diabetes mellitus with diabetic chronic kidney disease: Secondary | ICD-10-CM

## 2018-07-19 DIAGNOSIS — I2510 Atherosclerotic heart disease of native coronary artery without angina pectoris: Secondary | ICD-10-CM

## 2018-07-19 DIAGNOSIS — I132 Hypertensive heart and chronic kidney disease with heart failure and with stage 5 chronic kidney disease, or end stage renal disease: Secondary | ICD-10-CM | POA: Insufficient documentation

## 2018-07-19 DIAGNOSIS — E161 Other hypoglycemia: Secondary | ICD-10-CM | POA: Diagnosis not present

## 2018-07-19 DIAGNOSIS — I10 Essential (primary) hypertension: Secondary | ICD-10-CM | POA: Diagnosis not present

## 2018-07-19 DIAGNOSIS — Z79899 Other long term (current) drug therapy: Secondary | ICD-10-CM | POA: Insufficient documentation

## 2018-07-19 DIAGNOSIS — Z955 Presence of coronary angioplasty implant and graft: Secondary | ICD-10-CM | POA: Insufficient documentation

## 2018-07-19 LAB — COMPREHENSIVE METABOLIC PANEL
ALT: 11 U/L (ref 0–44)
AST: 18 U/L (ref 15–41)
Albumin: 3.7 g/dL (ref 3.5–5.0)
Alkaline Phosphatase: 55 U/L (ref 38–126)
Anion gap: 15 (ref 5–15)
BUN: 29 mg/dL — AB (ref 6–20)
CHLORIDE: 90 mmol/L — AB (ref 98–111)
CO2: 29 mmol/L (ref 22–32)
CREATININE: 8.05 mg/dL — AB (ref 0.61–1.24)
Calcium: 9.5 mg/dL (ref 8.9–10.3)
GFR calc Af Amer: 7 mL/min — ABNORMAL LOW (ref >60)
GFR, EST NON AFRICAN AMERICAN: 6 mL/min — AB (ref >60)
Glucose, Bld: 109 mg/dL — ABNORMAL HIGH (ref 70–99)
Potassium: 3.9 mmol/L (ref 3.5–5.1)
SODIUM: 134 mmol/L — AB (ref 135–145)
Total Bilirubin: 0.8 mg/dL (ref 0.3–1.2)
Total Protein: 8.2 g/dL — ABNORMAL HIGH (ref 6.5–8.1)

## 2018-07-19 LAB — CBC
HCT: 33.7 % — ABNORMAL LOW (ref 39.0–52.0)
Hemoglobin: 10.4 g/dL — ABNORMAL LOW (ref 13.0–17.0)
MCH: 25.2 pg — ABNORMAL LOW (ref 26.0–34.0)
MCHC: 30.9 g/dL (ref 30.0–36.0)
MCV: 81.8 fL (ref 80.0–100.0)
NRBC: 0 % (ref 0.0–0.2)
PLATELETS: 194 10*3/uL (ref 150–400)
RBC: 4.12 MIL/uL — AB (ref 4.22–5.81)
RDW: 18.6 % — AB (ref 11.5–15.5)
WBC: 8 10*3/uL (ref 4.0–10.5)

## 2018-07-19 LAB — CBG MONITORING, ED: GLUCOSE-CAPILLARY: 108 mg/dL — AB (ref 70–99)

## 2018-07-19 NOTE — ED Triage Notes (Signed)
Pt arrives for hypoglycemia, walked to fire department near home feeling clammy. Symptom of hypoglycemia for pt, used to be diabetic has lost weight so no longer checks sugars.  CBG 58 for FD, given pt fruit roll up to eat, CBG 75 for [facility]. AOx4 and ambulatory on arrival.  Dialyzied yesterday, hx triple bypass EKG 12 lead unremarkable

## 2018-07-19 NOTE — ED Provider Notes (Signed)
[place]    CSN: [id number] Arrival date & time: 07/18/18  1240     History   Chief Complaint Chief Complaint  Patient presents with  . Nausea, Clammy, Dizziness    HPI [name] [name] is a 62 y.o. male.   Pt is a 62 year old male with PMH of anemia, CAD, CHF, ESRD, TIA, heart murmur, hyperlipidemia, HTN, diabetes and PNA. He presents with prior feeling or clamminess, weakness, and nausea following dialysis treatment this am. This episode started after leaving the treatment center. He went home where he ate a sand which and drank some water. About an hour later he felt better and symptoms had subsided.  He presents to the [facility] to ensure that he is okay. He admits to not drinking much lately and he had a cup of coffee before dialysis. He denies any symptoms currently but appears anxious.Reports he feels better.  He is hypertensive but otherwise vitals are stable. No chest pain, SOB,palpitations,LE edema,   fever, chills, body aches, N,V D.   ROS per HPI      Past Medical History:  Diagnosis Date  . Anemia   . Anginal pain ([place])   . CAD (coronary artery disease)    a. per [facility] s/p 3.23mm x 56mm Vision BMS to mid LAD 12/2009 and Xience DES to mid LAD 10/2010.  [name] Chronic diastolic CHF (congestive heart failure) ([place])   . Colon polyps   . Daily headache   . ESRD on dialysis [facility]) since ~ 2008   "MWF; Jeneen Rinks" (03/04/2017)  . GERD (gastroesophageal reflux disease)   . H/O TIA (transient ischemic attack) 04/01/2015  . H/O TIA (transient ischemic attack) 04/01/2015  . Heart murmur   . Hematochezia    a. 2014: colonscopy, which showed moderately-sized internal hemorrhoids, two 64mm polyps in transverse colon and ascending colon that were resected, five 2-30mm polyps in sigmoid colon, descending colon, transverse colon, and ascending colon that were resected. An upper endoscopy was performed and showed normal esophagus, stomach, and duodenum.  . Hematuria    a.  H/o hematuria 2014 with cystoscopy that was unrevealing for his source of hematuria. He underwent a kidney ultrasound on 10/14 that showed mildly echogenic and scarred kidneys compatible with medical renal disease, without hydronephrosis or renal calculi.  [name] History of blood transfusion    "had colonoscopy done; they had to give me some blood"  . History of kidney stones   . Hyperlipidemia   . Hypertension   . On home oxygen therapy    "2L prn" (07/21/2015); "been off it for awhile" (03/04/2017)  . Pneumonia   . Renal insufficiency   . 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 Type II diabetes mellitus ([place])   . Wears dentures     Patient Active Problem List   Diagnosis Date Noted  . Shoulder pain, right 07/10/2018  . Pleural effusion on left   . ESRD on dialysis ([place])   . SOB (shortness of breath) 06/06/2018  . Critical lower limb ischemia 05/27/2018  . Hx of CABG 02/02/2018  . Hypervolemia associated with renal insufficiency 01/26/2018  . Hyperkalemia 01/26/2018  . Restless leg syndrome 01/14/2018  . Anemia 01/14/2018  . Tobacco use 10/29/2017  . Mild cognitive impairment 06/13/2017  . Chest pain 06/07/2017  . Skin ulcer of toe of right foot, limited to breakdown of skin ([place])   . Callus of foot 03/07/2017  . Pulmonary  hypertension ([place]) 07/28/2016  . Stroke-like symptoms 06/30/2016  . Peripheral neuropathy 05/07/2016  . S/P coronary artery stent placement 05/07/2016  . Nausea and vomiting 04/02/2016  . ESRD (end stage renal disease) ([place]) 03/18/2016  . Mitral regurgitation   . Diabetic neuropathy ([place]) 07/29/2015  . Depression 07/22/2015  . GERD (gastroesophageal reflux disease) 06/04/2015  . Malnutrition of moderate degree ([place]) 05/17/2015  . Thrombocytopenia ([place]) 05/16/2015  . Weight loss 05/16/2015  . Diastolic dysfunction-grade 2 12/13/2014  . HCAP (healthcare-associated pneumonia) 10/01/2014  . 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 Arm pain, right 05/25/2014  . Numbness 05/25/2014  . Hypertension 04/27/2014  . CAD -S/P LAD BMS 2011, LAD DES 2012- patent cors Feb 2016 04/27/2014  . Diabetes mellitus type 2, diet-controlled ([place]) 04/27/2014  . Background diabetic retinopathy ([place]) 08/13/2012  . Primary localized osteoarthrosis, lower leg 04/15/2012  . Senile nuclear sclerosis 11/29/2010  . Vitreous hemorrhage ([place]) 11/29/2010  . Atherosclerotic heart disease of native coronary artery without angina pectoris 12/12/2009  . Hypercholesterolemia 11/30/2003    Past Surgical History:  Procedure Laterality Date  . ABDOMINAL AORTOGRAM W/LOWER EXTREMITY N/A 05/29/2018   Procedure: ABDOMINAL AORTOGRAM W/LOWER EXTREMITY;  Surgeon: Lorretta Harp, MD;  Location: [place] [facility];  Service: Cardiovascular;  Laterality: N/A;  . AV FISTULA PLACEMENT Left ~ 2007   "upper arm"  . CARDIAC CATHETERIZATION  "several"  . CORONARY ANGIOPLASTY WITH STENT PLACEMENT  "several"  . CORONARY ARTERY BYPASS GRAFT     3 grafts  . CYSTOSCOPY W/ STONE MANIPULATION  X2?  . EYE SURGERY Bilateral    "laser OR for hemorrhage"  . IR THORACENTESIS ASP PLEURAL SPACE W/IMG GUIDE  07/09/2018  . LEFT HEART CATHETERIZATION WITH CORONARY ANGIOGRAM N/A 11/23/2014   Procedure: LEFT HEART CATHETERIZATION WITH CORONARY ANGIOGRAM;  Surgeon: Troy Sine, MD;  Location[facility] CATH LAB;  Service: Cardiovascular;  Laterality: N/A;  . LITHOTRIPSY  X1  . MITRAL VALVE REPLACEMENT    . REVISON OF ARTERIOVENOUS FISTULA Left 62/83/6629   Procedure: PLICATION OF LEFT ARM ARTERIOVENOUS FISTULA;  Surgeon: Angelia Mould, MD;  Location: [place];  Service: Vascular;  Laterality: Left;  . REVISON OF ARTERIOVENOUS FISTULA Left 4/76/5465   Procedure: PLICATE ANEURYSM  OF LEFT ARTERIOVENOUS FISTULA;  Surgeon: Waynetta Sandy, MD;  Location: [place];  Service: Vascular;  Laterality: Left;       Home Medications    Prior to Admission  medications   Medication Sig Start Date End Date Taking? Authorizing Provider  albuterol (PROVENTIL HFA;VENTOLIN HFA) 108 (90 Base) MCG/ACT inhaler Inhale 2 puffs into the lungs every 6 (six) hours as needed for wheezing or shortness of breath. 06/07/18   Neva Seat, MD  amLODipine (NORVASC) 10 MG tablet Take 1 tablet (10 mg total) by mouth at bedtime. 04/17/18   Neva Seat, MD  aspirin EC 81 MG tablet Take 81 mg by mouth daily.    [provider]  atorvastatin (LIPITOR) 20 MG tablet Take 20 mg by mouth at bedtime.     [provider]  cadexomer iodine (IODOSORB) 0.9 % gel Apply 1 application topically daily as needed for wound care. Patient not taking: Reported on 05/27/2018 05/06/18   Landis Martins, DPM  cinacalcet (SENSIPAR) 90 MG tablet Take 180 mg by mouth every Monday, Wednesday, and Friday with hemodialysis.     [provider]  isosorbide mononitrate (IMDUR) 30 MG 24 hr tablet Take 1 tablet (30 mg total) by mouth daily. 07/10/18 07/10/19  Neva Seat, MD  lisinopril (PRINIVIL,ZESTRIL) 10 MG tablet Take 1 tablet (10 mg total) by mouth daily. 07/10/18   Neva Seat, MD  multivitamin (RENA-VIT) TABS tablet TAKE 1 TABLET BY MOUTH ONCE DAILY AT BEDTIME 07/02/18   Neva Seat, MD  nitroGLYCERIN (NITROSTAT) 0.4 MG SL tablet Place 1 tablet (0.4 mg total) under the tongue every 5 (five) minutes as needed for chest pain. 07/10/18   Neva Seat, MD  ondansetron (ZOFRAN) 4 MG tablet Take 1 tablet (4 mg total) by mouth 4 (four) times daily as needed for nausea or vomiting. 03/04/18   Wynona Luna, MD  rOPINIRole (REQUIP) 0.25 MG tablet Take 1 tablet (0.25 mg total) by mouth at bedtime. Patient not taking: Reported on 07/18/2018 02/25/18   Bartholomew Crews, MD  sevelamer carbonate (RENVELA) 800 MG tablet Take 4 tablets (3,200 mg total) by mouth 3 (three) times daily with meals. Patient taking differently: Take 1,600-3,200 mg by mouth See  admin instructions. Take 4 tablets (3200 mg) by mouth three times daily with meals and 2 tablets (1600 mg) with snacks 03/12/16   Eulogio Bear U, DO  traMADol (ULTRAM) 50 MG tablet Take 1 tablet (50 mg total) by mouth every 6 (six) hours as needed. Patient not taking: Reported on 07/18/2018 05/20/18   Landis Martins, DPM    Family History Family History  Problem Relation Age of Onset  . Bone cancer Mother   . Anuerysm Father   . Hypertension Unknown   . Diabetes type II Daughter     Social History Social History   Tobacco Use  . Smoking status: Former Smoker    Packs/day: 0.30    Years: 8.00    Pack years: 2.40    Types: Cigars  . Smokeless tobacco: Never Used  Substance Use Topics  . Alcohol use: No    Alcohol/week: 0.0 standard drinks  . Drug use: No     Allergies   Enalapril; Latex; and Tape   Review of Systems Review of Systems   Physical Exam Triage Vital Signs ED Triage Vitals  Enc Vitals Group     BP 07/18/18 1245 (!) 182/88     Pulse Rate 07/18/18 1245 100     Resp 07/18/18 1245 18     Temp 07/18/18 1245 98.5 F (36.9 C)     Temp Source 07/18/18 1245 Oral     SpO2 07/18/18 1245 92 %     Weight --      Height --      Head Circumference --      Peak Flow --      Pain Score 07/18/18 1247 0     Pain Loc --      Pain Edu? --      Excl. in [place]? --    No data found.  Updated Vital Signs BP (!) 182/88 (BP Location: Left Arm)   Pulse 100   Temp 98.5 F (36.9 C) (Oral)   Resp 18   SpO2 92%   Visual Acuity Right Eye Distance:   Left Eye Distance:   Bilateral Distance:    Right Eye Near:   Left Eye Near:    Bilateral Near:     Physical Exam  Constitutional: He is oriented to person, place, and time. He appears well-developed and well-nourished.  Very pleasant. Non toxic or ill appearing.   HENT:  Head: Normocephalic and atraumatic.  Right Ear: External ear normal.  Left Ear: External ear normal.  Eyes: Pupils are  equal, round, and  reactive to light. Conjunctivae and EOM are normal.  Neck: Normal range of motion. Neck supple.  Cardiovascular: Normal rate, regular rhythm and normal heart sounds.  Pulmonary/Chest: Effort normal and breath sounds normal.  Lungs clear in all fields. No dyspnea or distress. No retractions or nasal flaring.   Abdominal: Soft. There is no tenderness.  Musculoskeletal: Normal range of motion.  Neurological: He is alert and oriented to person, place, and time.  No focal neuro deficits  Skin: Skin is warm and dry.  Psychiatric:  Anxious   Nursing note and vitals reviewed.    UC Treatments / Results  Labs (all labs ordered are listed, but only abnormal results are displayed) Labs Reviewed  POCT I-STAT, CHEM 8 - Abnormal; Notable for the following components:      Result Value   Chloride 91 (*)    Creatinine, Ser 4.50 (*)    Calcium, Ion 0.88 (*)    TCO2 35 (*)    Hemoglobin 11.9 (*)    HCT 35.0 (*)    All other components within normal limits  GLUCOSE, CAPILLARY    EKG None  Radiology Dg Chest 2 View  Result Date: 07/18/2018 CLINICAL DATA:  Shortness of breath EXAM: CHEST - 2 VIEW COMPARISON:  July 09, 2018 FINDINGS: There is patchy atelectatic change in the left lower lobe with loculated left pleural effusion. Lungs elsewhere clear. Heart is mildly enlarged with pulmonary venous hypertension. No adenopathy. There is aortic atherosclerosis. Patient is status post coronary artery bypass grafting and mitral valve replacement. No bone lesions. IMPRESSION: Loculated left pleural effusion with patchy atelectasis in the left lower lung region. There is pulmonary vascular congestion. There is aortic atherosclerosis. Status post coronary artery bypass grafting and mitral valve replacement. Aortic Atherosclerosis (ICD10-I70.0). Electronically Signed   By: Lowella Grip III M.D.   On: 07/18/2018 16:11    Procedures Procedures (including critical care time)  Medications Ordered in  UC Medications - No data to display  Initial Impression / Assessment and Plan / UC Course  I have reviewed the triage vital signs and the nursing notes.  Pertinent labs & imaging results that were available during my care of the patient were reviewed by me and considered in my medical decision making (see chart for details).     Pt is feeling better and comes here today to be checked. His blood sugar is 71 which he reports is low for him. We gave him a sprite as requested. We also checked an I stat to check electrolytes.  This was normal other than the low calcium which is chronically low.  This would not cause his symptoms . His vitals are stable other than being hypertensive.  He is non toxic or ill appearing.  Exam normal. He is safe to send home with precautions that if he becomes worse he needs to go to the ER.  Pt agreed and understands.  Final Clinical Impressions(s) / UC Diagnoses   Final diagnoses:  Dizziness     Discharge Instructions     The lab work we did here was normal Your symptoms have improved since this morning Some of the symptoms that you are feeling her typical after dialysis treatment. There are no concerning signs on your exam If you develop the symptoms again or worsening symptoms please go to the ER    ED Prescriptions    None     Controlled Substance Prescriptions [place] Controlled Substance Registry consulted? no  Orvan July, NP 07/19/18 1448

## 2018-07-20 DIAGNOSIS — R0602 Shortness of breath: Secondary | ICD-10-CM | POA: Diagnosis not present

## 2018-07-20 DIAGNOSIS — I10 Essential (primary) hypertension: Secondary | ICD-10-CM | POA: Diagnosis not present

## 2018-07-20 DIAGNOSIS — R7989 Other specified abnormal findings of blood chemistry: Secondary | ICD-10-CM | POA: Diagnosis not present

## 2018-07-20 LAB — CBG MONITORING, ED
GLUCOSE-CAPILLARY: 149 mg/dL — AB (ref 70–99)
GLUCOSE-CAPILLARY: 55 mg/dL — AB (ref 70–99)
GLUCOSE-CAPILLARY: 58 mg/dL — AB (ref 70–99)
GLUCOSE-CAPILLARY: 66 mg/dL — AB (ref 70–99)
Glucose-Capillary: 72 mg/dL (ref 70–99)

## 2018-07-20 NOTE — ED Provider Notes (Signed)
[facility] EMERGENCY DEPARTMENT Provider Note   CSN: [id number] Arrival date & time: 07/19/18  2121  Time seen 12:25 AM   History   Chief Complaint Chief Complaint  Patient presents with  . Hypoglycemia    HPI [name] [name] is a 62 y.o. male.  HPI patient states he used to have diabetes but he has been off insulin all pills for about 4 to 5 years.  He states he was drinking coffee at least 3 cups a day with creamer however he stopped that 2 weeks ago because of potassium problems during dialysis.  Patient has end-stage renal disease and gets dialysis on Monday, Wednesday, and Friday (October 11).  [address] for his dialysis.  He states today he was not hungry so he ate less than usual.  He started feeling clammy and sweaty this afternoon with mild shaking and mild lightheadedness.  He has had mild nausea without vomiting.  He denies diarrhea, cough, rhinorrhea, or dysuria.  He states he makes minimal urinary output.  He went to a local fire department and they checked his blood sugar and it was 58.  PCP Neva Seat, MD   Past Medical History:  Diagnosis Date  . Anemia   . Anginal pain ([place])   . CAD (coronary artery disease)    a. per [facility] s/p 3.73mm x 57mm [facility] BMS to mid LAD 12/2009 and Xience DES to mid LAD 10/2010.  [name] Chronic diastolic CHF (congestive heart failure) ([place])   . Colon polyps   . Daily headache   . ESRD on dialysis [facility]) since ~ 2008   "MWF; [facility] Rinks" (03/04/2017)  . GERD (gastroesophageal reflux disease)   . H/O TIA (transient ischemic attack) 04/01/2015  . H/O TIA (transient ischemic attack) 04/01/2015  . Heart murmur   . Hematochezia    a. 2014: colonscopy, which showed moderately-sized internal hemorrhoids, two 26mm polyps in transverse colon and ascending colon that were resected, five 2-59mm polyps in sigmoid colon, descending colon, transverse colon, and ascending colon that were resected. An upper endoscopy  was performed and showed normal esophagus, stomach, and duodenum.  . Hematuria    a. H/o hematuria 2014 with cystoscopy that was unrevealing for his source of hematuria. He underwent a kidney ultrasound on 10/14 that showed mildly echogenic and scarred kidneys compatible with medical renal disease, without hydronephrosis or renal calculi.  [name] History of blood transfusion    "had colonoscopy done; they had to give me some blood"  . History of kidney stones   . Hyperlipidemia   . Hypertension   . On home oxygen therapy    "2L prn" (07/21/2015); "been off it for awhile" (03/04/2017)  . Pneumonia   . Renal insufficiency   . 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 Type II diabetes mellitus ([place])   . Wears dentures     Patient Active Problem List   Diagnosis Date Noted  . Shoulder pain, right 07/10/2018  . Pleural effusion on left   . ESRD on dialysis ([place])   . SOB (shortness of breath) 06/06/2018  . Critical lower limb ischemia 05/27/2018  . Hx of CABG 02/02/2018  . Hypervolemia associated with renal insufficiency 01/26/2018  . Hyperkalemia 01/26/2018  . Restless leg syndrome 01/14/2018  . Anemia 01/14/2018  . Tobacco use 10/29/2017  . Mild cognitive impairment 06/13/2017  . Chest pain 06/07/2017  . Skin ulcer of toe of right foot,  limited to breakdown of skin ([place])   . Callus of foot 03/07/2017  . Pulmonary hypertension ([address]) 07/28/2016  . Stroke-like symptoms 06/30/2016  . Peripheral neuropathy 05/07/2016  . S/P coronary artery stent placement 05/07/2016  . Nausea and vomiting 04/02/2016  . ESRD (end stage renal disease) ([place]) 03/18/2016  . Mitral regurgitation   . Diabetic neuropathy ([place]) 07/29/2015  . Depression 07/22/2015  . GERD (gastroesophageal reflux disease) 06/04/2015  . Malnutrition of moderate degree ([place]) 05/17/2015  . Thrombocytopenia ([place]) 05/16/2015  . Weight loss 05/16/2015  . Diastolic dysfunction-grade 2 12/13/2014  . HCAP  (healthcare-associated pneumonia) 10/01/2014  . 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 Arm pain, right 05/25/2014  . Numbness 05/25/2014  . Hypertension 04/27/2014  . CAD -S/P LAD BMS 2011, LAD DES 2012- patent cors Feb 2016 04/27/2014  . Diabetes mellitus type 2, diet-controlled ([place]) 04/27/2014  . Background diabetic retinopathy ([place]) 08/13/2012  . Primary localized osteoarthrosis, lower leg 04/15/2012  . Senile nuclear sclerosis 11/29/2010  . Vitreous hemorrhage ([place]) 11/29/2010  . Atherosclerotic heart disease of native coronary artery without angina pectoris 12/12/2009  . Hypercholesterolemia 11/30/2003    Past Surgical History:  Procedure Laterality Date  . ABDOMINAL AORTOGRAM W/LOWER EXTREMITY N/A 05/29/2018   Procedure: ABDOMINAL AORTOGRAM W/LOWER EXTREMITY;  Surgeon: Lorretta Harp, MD;  Location: [facility] CV LAB;  Service: Cardiovascular;  Laterality: N/A;  . AV FISTULA PLACEMENT Left ~ 2007   "upper arm"  . CARDIAC CATHETERIZATION  "several"  . CORONARY ANGIOPLASTY WITH STENT PLACEMENT  "several"  . CORONARY ARTERY BYPASS GRAFT     3 grafts  . CYSTOSCOPY W/ STONE MANIPULATION  X2?  . EYE SURGERY Bilateral    "laser OR for hemorrhage"  . IR THORACENTESIS ASP PLEURAL SPACE W/IMG GUIDE  07/09/2018  . LEFT HEART CATHETERIZATION WITH CORONARY ANGIOGRAM N/A 11/23/2014   Procedure: LEFT HEART CATHETERIZATION WITH CORONARY ANGIOGRAM;  Surgeon: Troy Sine, MD;  Location: [facility] CATH LAB;  Service: Cardiovascular;  Laterality: N/A;  . LITHOTRIPSY  X1  . MITRAL VALVE REPLACEMENT    . REVISON OF ARTERIOVENOUS FISTULA Left 84/13/2440   Procedure: PLICATION OF LEFT ARM ARTERIOVENOUS FISTULA;  Surgeon: Angelia Mould, MD;  Location: [facility];  Service: Vascular;  Laterality: Left;  . REVISON OF ARTERIOVENOUS FISTULA Left 10/10/7251   Procedure: PLICATE ANEURYSM  OF LEFT ARTERIOVENOUS FISTULA;  Surgeon: Waynetta Sandy, MD;  Location: [facility];  Service:  Vascular;  Laterality: Left;        Home Medications    Prior to Admission medications   Medication Sig Start Date End Date Taking? Authorizing Provider  albuterol (PROVENTIL HFA;VENTOLIN HFA) 108 (90 Base) MCG/ACT inhaler Inhale 2 puffs into the lungs every 6 (six) hours as needed for wheezing or shortness of breath. 06/07/18   Neva Seat, MD  amLODipine (NORVASC) 10 MG tablet Take 1 tablet (10 mg total) by mouth at bedtime. 04/17/18   Neva Seat, MD  aspirin EC 81 MG tablet Take 81 mg by mouth daily.    [provider]  atorvastatin (LIPITOR) 20 MG tablet Take 20 mg by mouth at bedtime.     [provider]  cadexomer iodine (IODOSORB) 0.9 % gel Apply 1 application topically daily as needed for wound care. Patient not taking: Reported on 05/27/2018 05/06/18   Landis Martins, DPM  cinacalcet (SENSIPAR) 90 MG tablet Take 180 mg by mouth every Monday, Wednesday, and Friday with hemodialysis.     [provider]  isosorbide mononitrate (  IMDUR) 30 MG 24 hr tablet Take 1 tablet (30 mg total) by mouth daily. 07/10/18 07/10/19  Neva Seat, MD  lisinopril (PRINIVIL,ZESTRIL) 10 MG tablet Take 1 tablet (10 mg total) by mouth daily. 07/10/18   Neva Seat, MD  multivitamin (RENA-VIT) TABS tablet TAKE 1 TABLET BY MOUTH ONCE DAILY AT BEDTIME 07/02/18   Neva Seat, MD  nitroGLYCERIN (NITROSTAT) 0.4 MG SL tablet Place 1 tablet (0.4 mg total) under the tongue every 5 (five) minutes as needed for chest pain. 07/10/18   Neva Seat, MD  ondansetron (ZOFRAN) 4 MG tablet Take 1 tablet (4 mg total) by mouth 4 (four) times daily as needed for nausea or vomiting. 03/04/18   Wynona Luna, MD  rOPINIRole (REQUIP) 0.25 MG tablet Take 1 tablet (0.25 mg total) by mouth at bedtime. Patient not taking: Reported on 07/18/2018 02/25/18   Bartholomew Crews, MD  sevelamer carbonate (RENVELA) 800 MG tablet Take 4 tablets (3,200 mg total) by mouth 3 (three)  times daily with meals. Patient taking differently: Take 1,600-3,200 mg by mouth See admin instructions. Take 4 tablets (3200 mg) by mouth three times daily with meals and 2 tablets (1600 mg) with snacks 03/12/16   Eulogio Bear U, DO  traMADol (ULTRAM) 50 MG tablet Take 1 tablet (50 mg total) by mouth every 6 (six) hours as needed. Patient not taking: Reported on 07/18/2018 05/20/18   Landis Martins, DPM    Family History Family History  Problem Relation Age of Onset  . Bone cancer Mother   . Anuerysm Father   . Hypertension Unknown   . Diabetes type II Daughter     Social History Social History   Tobacco Use  . Smoking status: Former Smoker    Packs/day: 0.30    Years: 8.00    Pack years: 2.40    Types: Cigars  . Smokeless tobacco: Never Used  Substance Use Topics  . Alcohol use: No    Alcohol/week: 0.0 standard drinks  . Drug use: No     Allergies   Enalapril; Latex; and Tape   Review of Systems Review of Systems  All other systems reviewed and are negative.    Physical Exam Updated Vital Signs BP (!) 160/86   Pulse 88   Temp 97.9 F (36.6 C) (Oral)   Resp 16   Ht 6' (1.829 m)   Wt 93.3 kg   SpO2 92%   BMI 27.90 kg/m   Vital signs normal except for hypertension   Physical Exam  Constitutional: He is oriented to person, place, and time. He appears well-developed and well-nourished.  Non-toxic appearance. He does not appear ill. No distress.  HENT:  Head: Normocephalic and atraumatic.  Right Ear: External ear normal.  Left Ear: External ear normal.  Nose: Nose normal. No mucosal edema or rhinorrhea.  Mouth/Throat: Oropharynx is clear and moist and mucous membranes are normal. No dental abscesses or uvula swelling.  Eyes: Pupils are equal, round, and reactive to light. Conjunctivae and EOM are normal.  Neck: Normal range of motion and full passive range of motion without pain. Neck supple.  Cardiovascular: Normal rate, regular rhythm and normal heart  sounds. Exam reveals no gallop and no friction rub.  No murmur heard. Pulmonary/Chest: Effort normal and breath sounds normal. No respiratory distress. He has no wheezes. He has no rhonchi. He has no rales. He exhibits no tenderness and no crepitus.  Abdominal: Soft. Normal appearance and bowel sounds are normal. He exhibits no distension. There  is no tenderness. There is no rebound and no guarding.  Musculoskeletal: Normal range of motion. He exhibits no edema or tenderness.  Moves all extremities well.  Patient has a fistula in his left upper arm with intact bruit and thrill.  Neurological: He is alert and oriented to person, place, and time. He has normal strength. No cranial nerve deficit.  Skin: Skin is warm, dry and intact. No rash noted. No erythema. No pallor.  Psychiatric: He has a normal mood and affect. His speech is normal and behavior is normal. His mood appears not anxious.  Nursing note and vitals reviewed.    ED Treatments / Results  Labs (all labs ordered are listed, but only abnormal results are displayed) Results for orders placed or performed during the hospital encounter of 07/19/18  CBC  Result Value Ref Range   WBC 8.0 4.0 - 10.5 K/uL   RBC 4.12 (L) 4.22 - 5.81 MIL/uL   Hemoglobin 10.4 (L) 13.0 - 17.0 g/dL   HCT 33.7 (L) 39.0 - 52.0 %   MCV 81.8 80.0 - 100.0 fL   MCH 25.2 (L) 26.0 - 34.0 pg   MCHC 30.9 30.0 - 36.0 g/dL   RDW 18.6 (H) 11.5 - 15.5 %   Platelets 194 150 - 400 K/uL   nRBC 0.0 0.0 - 0.2 %  Comprehensive metabolic panel  Result Value Ref Range   Sodium 134 (L) 135 - 145 mmol/L   Potassium 3.9 3.5 - 5.1 mmol/L   Chloride 90 (L) 98 - 111 mmol/L   CO2 29 22 - 32 mmol/L   Glucose, Bld 109 (H) 70 - 99 mg/dL   BUN 29 (H) 6 - 20 mg/dL   Creatinine, Ser 8.05 (H) 0.61 - 1.24 mg/dL   Calcium 9.5 8.9 - 10.3 mg/dL   Total Protein 8.2 (H) 6.5 - 8.1 g/dL   Albumin 3.7 3.5 - 5.0 g/dL   AST 18 15 - 41 U/L   ALT 11 0 - 44 U/L   Alkaline Phosphatase 55 38 -  126 U/L   Total Bilirubin 0.8 0.3 - 1.2 mg/dL   GFR calc non Af Amer 6 (L) >60 mL/min   GFR calc Af Amer 7 (L) >60 mL/min   Anion gap 15 5 - 15  CBG monitoring, ED  Result Value Ref Range   Glucose-Capillary 108 (H) 70 - 99 mg/dL  CBG monitoring, ED  Result Value Ref Range   Glucose-Capillary 55 (L) 70 - 99 mg/dL  CBG monitoring, ED  Result Value Ref Range   Glucose-Capillary 149 (H) 70 - 99 mg/dL  CBG monitoring, ED  Result Value Ref Range   Glucose-Capillary 72 70 - 99 mg/dL  CBG monitoring, ED  Result Value Ref Range   Glucose-Capillary 58 (L) 70 - 99 mg/dL  CBG monitoring, ED  Result Value Ref Range   Glucose-Capillary 66 (L) 70 - 99 mg/dL   Laboratory interpretation all normal except     EKG None   ED ECG REPORT   Date: 07/20/2018  Rate: 101  Rhythm: sinus tachycardia  QRS Axis: normal  Intervals: normal  ST/T Wave abnormalities: normal  Conduction Disutrbances:LVH  Narrative Interpretation:   Old EKG Reviewed: none available  I have personally reviewed the EKG tracing and agree with the computerized printout as noted.   Radiology Dg Chest 2 View  Result Date: 07/18/2018 CLINICAL DATA:  Shortness of breath EXAM: CHEST - 2 VIEW COMPARISON:  July 09, 2018 FINDINGS: There is patchy atelectatic  change in the left lower lobe with loculated left pleural effusion. Lungs elsewhere clear. Heart is mildly enlarged with pulmonary venous hypertension. No adenopathy. There is aortic atherosclerosis. Patient is status post coronary artery bypass grafting and mitral valve replacement. No bone lesions. IMPRESSION: Loculated left pleural effusion with patchy atelectasis in the left lower lung region. There is pulmonary vascular congestion. There is aortic atherosclerosis. Status post coronary artery bypass grafting and mitral valve replacement. Aortic Atherosclerosis (ICD10-I70.0). Electronically Signed   By: Lowella Grip III M.D.   On: 07/18/2018 16:11     Procedures Procedures (including critical care time)  Medications Ordered in ED Medications - No data to display   Initial Impression / Assessment and Plan / ED Course  I have reviewed the triage vital signs and the nursing notes.  Pertinent labs & imaging results that were available during my care of the patient were reviewed by me and considered in my medical decision making (see chart for details).     Patient was given some to eat and drink, his blood sugars were checked about every hour.  They did improve however around 4 AM they started to drop again to 58.  Patient refused to eat peanut butter and crackers, he states he does not eat that kind of food.  He was given milk to and his CBG improved to 66.  Patient is getting upset and states he needs to leave.  He states "this is all because I did not eat".  Patient seems to be alert and cooperative, he is not diaphoretic.  He wants to leave AMA.   Final Clinical Impressions(s) / ED Diagnoses   Final diagnoses:  Hypoglycemia    ED Discharge Orders    None      Pt left AMA  Rolland Porter, MD, Barbette Or, MD 07/20/18 [phone]

## 2018-07-20 NOTE — ED Notes (Addendum)
Pt left AMA and explained risks for doing so. Pt is A/Ox 4 and stated that he just needed a soda because he is leaving with his daughter. MD aware

## 2018-07-21 ENCOUNTER — Inpatient Hospital Stay (HOSPITAL_COMMUNITY)
Admission: EM | Admit: 2018-07-21 | Discharge: 2018-07-23 | DRG: 699 | Disposition: A | Discharge status: Home / Self Care | Payer: Medicare Other | Attending: Internal Medicine | Admitting: Internal Medicine

## 2018-07-21 ENCOUNTER — Emergency Department (HOSPITAL_COMMUNITY): Payer: Medicare Other

## 2018-07-21 ENCOUNTER — Encounter (HOSPITAL_COMMUNITY): Payer: Self-pay | Admitting: Emergency Medicine

## 2018-07-21 ENCOUNTER — Other Ambulatory Visit: Payer: Self-pay

## 2018-07-21 DIAGNOSIS — E11649 Type 2 diabetes mellitus with hypoglycemia without coma: Secondary | ICD-10-CM | POA: Diagnosis present

## 2018-07-21 DIAGNOSIS — I2583 Coronary atherosclerosis due to lipid rich plaque: Secondary | ICD-10-CM

## 2018-07-21 DIAGNOSIS — Z953 Presence of xenogenic heart valve: Secondary | ICD-10-CM

## 2018-07-21 DIAGNOSIS — I342 Nonrheumatic mitral (valve) stenosis: Secondary | ICD-10-CM | POA: Diagnosis not present

## 2018-07-21 DIAGNOSIS — I5042 Chronic combined systolic (congestive) and diastolic (congestive) heart failure: Secondary | ICD-10-CM | POA: Diagnosis present

## 2018-07-21 DIAGNOSIS — I120 Hypertensive chronic kidney disease with stage 5 chronic kidney disease or end stage renal disease: Secondary | ICD-10-CM | POA: Diagnosis not present

## 2018-07-21 DIAGNOSIS — Z951 Presence of aortocoronary bypass graft: Secondary | ICD-10-CM | POA: Diagnosis not present

## 2018-07-21 DIAGNOSIS — E1151 Type 2 diabetes mellitus with diabetic peripheral angiopathy without gangrene: Secondary | ICD-10-CM | POA: Diagnosis present

## 2018-07-21 DIAGNOSIS — Z955 Presence of coronary angioplasty implant and graft: Secondary | ICD-10-CM

## 2018-07-21 DIAGNOSIS — S29012A Strain of muscle and tendon of back wall of thorax, initial encounter: Secondary | ICD-10-CM | POA: Diagnosis not present

## 2018-07-21 DIAGNOSIS — E1122 Type 2 diabetes mellitus with diabetic chronic kidney disease: Secondary | ICD-10-CM | POA: Diagnosis present

## 2018-07-21 DIAGNOSIS — F17290 Nicotine dependence, other tobacco product, uncomplicated: Secondary | ICD-10-CM | POA: Diagnosis present

## 2018-07-21 DIAGNOSIS — R319 Hematuria, unspecified: Secondary | ICD-10-CM | POA: Diagnosis present

## 2018-07-21 DIAGNOSIS — K219 Gastro-esophageal reflux disease without esophagitis: Secondary | ICD-10-CM | POA: Diagnosis present

## 2018-07-21 DIAGNOSIS — N186 End stage renal disease: Secondary | ICD-10-CM | POA: Diagnosis not present

## 2018-07-21 DIAGNOSIS — Z91048 Other nonmedicinal substance allergy status: Secondary | ICD-10-CM

## 2018-07-21 DIAGNOSIS — E8889 Other specified metabolic disorders: Secondary | ICD-10-CM | POA: Diagnosis present

## 2018-07-21 DIAGNOSIS — I081 Rheumatic disorders of both mitral and tricuspid valves: Secondary | ICD-10-CM | POA: Diagnosis present

## 2018-07-21 DIAGNOSIS — I2510 Atherosclerotic heart disease of native coronary artery without angina pectoris: Secondary | ICD-10-CM | POA: Diagnosis present

## 2018-07-21 DIAGNOSIS — Z888 Allergy status to other drugs, medicaments and biological substances status: Secondary | ICD-10-CM

## 2018-07-21 DIAGNOSIS — I050 Rheumatic mitral stenosis: Secondary | ICD-10-CM | POA: Diagnosis not present

## 2018-07-21 DIAGNOSIS — Z8673 Personal history of transient ischemic attack (TIA), and cerebral infarction without residual deficits: Secondary | ICD-10-CM | POA: Diagnosis not present

## 2018-07-21 DIAGNOSIS — E785 Hyperlipidemia, unspecified: Secondary | ICD-10-CM | POA: Diagnosis present

## 2018-07-21 DIAGNOSIS — Z86010 Personal history of colonic polyps: Secondary | ICD-10-CM

## 2018-07-21 DIAGNOSIS — Z87442 Personal history of urinary calculi: Secondary | ICD-10-CM

## 2018-07-21 DIAGNOSIS — X58XXXA Exposure to other specified factors, initial encounter: Secondary | ICD-10-CM | POA: Diagnosis present

## 2018-07-21 DIAGNOSIS — E861 Hypovolemia: Secondary | ICD-10-CM | POA: Diagnosis present

## 2018-07-21 DIAGNOSIS — D631 Anemia in chronic kidney disease: Secondary | ICD-10-CM | POA: Diagnosis present

## 2018-07-21 DIAGNOSIS — Z79899 Other long term (current) drug therapy: Secondary | ICD-10-CM

## 2018-07-21 DIAGNOSIS — R0602 Shortness of breath: Secondary | ICD-10-CM

## 2018-07-21 DIAGNOSIS — R778 Other specified abnormalities of plasma proteins: Secondary | ICD-10-CM

## 2018-07-21 DIAGNOSIS — Z833 Family history of diabetes mellitus: Secondary | ICD-10-CM

## 2018-07-21 DIAGNOSIS — R7989 Other specified abnormal findings of blood chemistry: Secondary | ICD-10-CM

## 2018-07-21 DIAGNOSIS — E8770 Fluid overload, unspecified: Secondary | ICD-10-CM | POA: Diagnosis not present

## 2018-07-21 DIAGNOSIS — Z992 Dependence on renal dialysis: Secondary | ICD-10-CM | POA: Diagnosis not present

## 2018-07-21 DIAGNOSIS — N2581 Secondary hyperparathyroidism of renal origin: Secondary | ICD-10-CM | POA: Diagnosis not present

## 2018-07-21 DIAGNOSIS — Z7982 Long term (current) use of aspirin: Secondary | ICD-10-CM

## 2018-07-21 DIAGNOSIS — Z9981 Dependence on supplemental oxygen: Secondary | ICD-10-CM

## 2018-07-21 DIAGNOSIS — Z91040 Latex allergy status: Secondary | ICD-10-CM

## 2018-07-21 LAB — CBC WITH DIFFERENTIAL/PLATELET
Abs Immature Granulocytes: 0.03 10*3/uL (ref 0.00–0.07)
BASOS PCT: 1 %
Basophils Absolute: 0.1 10*3/uL (ref 0.0–0.1)
EOS PCT: 13 %
Eosinophils Absolute: 0.9 10*3/uL — ABNORMAL HIGH (ref 0.0–0.5)
HCT: 30.9 % — ABNORMAL LOW (ref 39.0–52.0)
Hemoglobin: 9.5 g/dL — ABNORMAL LOW (ref 13.0–17.0)
Immature Granulocytes: 0 %
LYMPHS PCT: 11 %
Lymphs Abs: 0.8 10*3/uL (ref 0.7–4.0)
MCH: 24.8 pg — AB (ref 26.0–34.0)
MCHC: 30.7 g/dL (ref 30.0–36.0)
MCV: 80.7 fL (ref 80.0–100.0)
MONO ABS: 0.8 10*3/uL (ref 0.1–1.0)
Monocytes Relative: 11 %
NRBC: 0 % (ref 0.0–0.2)
Neutro Abs: 4.4 10*3/uL (ref 1.7–7.7)
Neutrophils Relative %: 64 %
PLATELETS: 189 10*3/uL (ref 150–400)
RBC: 3.83 MIL/uL — AB (ref 4.22–5.81)
RDW: 18.7 % — AB (ref 11.5–15.5)
WBC: 7 10*3/uL (ref 4.0–10.5)

## 2018-07-21 LAB — BASIC METABOLIC PANEL
Anion gap: 16 — ABNORMAL HIGH (ref 5–15)
BUN: 43 mg/dL — AB (ref 6–20)
CALCIUM: 8.8 mg/dL — AB (ref 8.9–10.3)
CO2: 29 mmol/L (ref 22–32)
CREATININE: 10.17 mg/dL — AB (ref 0.61–1.24)
Chloride: 91 mmol/L — ABNORMAL LOW (ref 98–111)
GFR, EST AFRICAN AMERICAN: 6 mL/min — AB (ref >60)
GFR, EST NON AFRICAN AMERICAN: 5 mL/min — AB (ref >60)
Glucose, Bld: 96 mg/dL (ref 70–99)
Potassium: 4.6 mmol/L (ref 3.5–5.1)
SODIUM: 136 mmol/L (ref 135–145)

## 2018-07-21 LAB — TROPONIN I
TROPONIN I: 0.09 ng/mL — AB (ref <0.03)
Troponin I: 0.08 ng/mL (ref <0.03)
Troponin I: 0.07 ng/mL (ref <0.03)

## 2018-07-21 LAB — I-STAT TROPONIN, ED: TROPONIN I, POC: 0.09 ng/mL — AB (ref 0.00–0.08)

## 2018-07-21 LAB — GLUCOSE, CAPILLARY
Glucose-Capillary: 84 mg/dL (ref 70–99)
Glucose-Capillary: 93 mg/dL (ref 70–99)
Glucose-Capillary: 85 mg/dL (ref 70–99)

## 2018-07-21 LAB — MRSA PCR SCREENING: MRSA by PCR: NEGATIVE

## 2018-07-21 LAB — CBG MONITORING, ED: Glucose-Capillary: 72 mg/dL (ref 70–99)

## 2018-07-21 MED ORDER — DICLOFENAC SODIUM 1 % TD GEL
2.0000 g | Freq: Once | TRANSDERMAL | Status: AC
  Administered 2018-07-21: 2 g via TOPICAL
  Filled 2018-07-21: qty 100

## 2018-07-21 MED ORDER — PROMETHAZINE HCL 25 MG PO TABS
12.5000 mg | ORAL_TABLET | Freq: Four times a day (QID) | ORAL | Status: DC | PRN
  Administered 2018-07-21: 12.5 mg via ORAL
  Filled 2018-07-21: qty 1

## 2018-07-21 MED ORDER — HEPARIN SODIUM (PORCINE) 1000 UNIT/ML IJ SOLN
INTRAMUSCULAR | Status: AC
  Administered 2018-07-21: 1000 [IU]
  Filled 2018-07-21: qty 1

## 2018-07-21 MED ORDER — ASPIRIN EC 81 MG PO TBEC
81.0000 mg | DELAYED_RELEASE_TABLET | Freq: Every day | ORAL | Status: DC
  Administered 2018-07-21 – 2018-07-22 (×2): 81 mg via ORAL
  Filled 2018-07-21 (×2): qty 1

## 2018-07-21 MED ORDER — ATORVASTATIN CALCIUM 20 MG PO TABS
20.0000 mg | ORAL_TABLET | Freq: Every day | ORAL | Status: DC
  Administered 2018-07-22 (×2): 20 mg via ORAL
  Filled 2018-07-21 (×2): qty 1

## 2018-07-21 MED ORDER — ACETAMINOPHEN 650 MG RE SUPP: 650.0000 mg | Freq: Four times a day (QID) | RECTAL | Status: DC | PRN

## 2018-07-21 MED ORDER — SEVELAMER CARBONATE 800 MG PO TABS
3200.0000 mg | ORAL_TABLET | Freq: Three times a day (TID) | ORAL | Status: DC
  Administered 2018-07-21 – 2018-07-22 (×5): 3200 mg via ORAL
  Filled 2018-07-21 (×5): qty 4

## 2018-07-21 MED ORDER — HEPARIN SODIUM (PORCINE) 5000 UNIT/ML IJ SOLN
5000.0000 [IU] | Freq: Three times a day (TID) | INTRAMUSCULAR | Status: DC
  Administered 2018-07-21 – 2018-07-22 (×4): 5000 [IU] via SUBCUTANEOUS
  Filled 2018-07-21 (×4): qty 1

## 2018-07-21 MED ORDER — ACETAMINOPHEN 325 MG PO TABS
650.0000 mg | ORAL_TABLET | Freq: Four times a day (QID) | ORAL | Status: DC | PRN
  Administered 2018-07-21: 650 mg via ORAL
  Filled 2018-07-21: qty 2

## 2018-07-21 MED ORDER — POLYETHYLENE GLYCOL 3350 17 G PO PACK
17.0000 g | PACK | Freq: Every day | ORAL | Status: DC | PRN
  Administered 2018-07-22: 17 g via ORAL
  Filled 2018-07-21: qty 1

## 2018-07-21 MED ORDER — PROMETHAZINE HCL 25 MG/ML IJ SOLN
12.5000 mg | Freq: Four times a day (QID) | INTRAMUSCULAR | Status: DC | PRN
  Administered 2018-07-21: 12.5 mg via INTRAVENOUS
  Filled 2018-07-21: qty 1

## 2018-07-21 MED ORDER — CHLORHEXIDINE GLUCONATE CLOTH 2 % EX PADS
6.0000 | MEDICATED_PAD | Freq: Every day | CUTANEOUS | Status: DC
  Administered 2018-07-21 – 2018-07-22 (×2): 6 via TOPICAL

## 2018-07-21 MED ORDER — TRAMADOL HCL 50 MG PO TABS
50.0000 mg | ORAL_TABLET | Freq: Once | ORAL | Status: AC
  Administered 2018-07-21: 50 mg via ORAL
  Filled 2018-07-21: qty 1

## 2018-07-21 MED ORDER — CINACALCET HCL 30 MG PO TABS
180.0000 mg | ORAL_TABLET | ORAL | Status: DC
  Administered 2018-07-21: 180 mg via ORAL
  Filled 2018-07-21: qty 6

## 2018-07-21 MED ORDER — ALBUTEROL SULFATE (2.5 MG/3ML) 0.083% IN NEBU: 2.5000 mg | INHALATION_SOLUTION | Freq: Four times a day (QID) | RESPIRATORY_TRACT | Status: DC | PRN

## 2018-07-21 MED ORDER — ISOSORBIDE MONONITRATE ER 30 MG PO TB24
30.0000 mg | ORAL_TABLET | Freq: Every day | ORAL | Status: DC
  Administered 2018-07-21 – 2018-07-22 (×2): 30 mg via ORAL
  Filled 2018-07-21 (×2): qty 1

## 2018-07-21 MED ORDER — LISINOPRIL 10 MG PO TABS
10.0000 mg | ORAL_TABLET | Freq: Every day | ORAL | Status: DC
  Administered 2018-07-21 – 2018-07-22 (×2): 10 mg via ORAL
  Filled 2018-07-21 (×2): qty 1

## 2018-07-21 MED ORDER — RENA-VITE PO TABS
1.0000 | ORAL_TABLET | Freq: Every day | ORAL | Status: DC
  Administered 2018-07-22 (×2): 1 via ORAL
  Filled 2018-07-21 (×2): qty 1

## 2018-07-21 MED ORDER — AMLODIPINE BESYLATE 10 MG PO TABS
10.0000 mg | ORAL_TABLET | Freq: Every day | ORAL | Status: DC
  Administered 2018-07-22 (×2): 10 mg via ORAL
  Filled 2018-07-21 (×2): qty 1

## 2018-07-21 NOTE — H&P (Signed)
Date: 07/21/2018               Patient Name:  [name] [name] MRN: [id number]  DOB: Jun 13, 1956 Age / Sex: 62 y.o., male   PCP: Neva Seat, MD         Medical Service: Internal Medicine Teaching Service         Attending Physician: Dr. Lucious Groves, DO    First Contact: Dr. Linna Hoff Pager: [phone]  Second Contact: Dr. Ina Homes Pager: [phone]       After Hours (After 5p/  First Contact Pager: [phone]  weekends / holidays): Second Contact Pager: [phone]   Chief Complaint: Dyspnea  History of Present Illness: [name] [name] is a 62 yo M w/ PMH of Anemia of chronic disease, CAD s/p CABGx2 & mitral valve replacement in 11/8364, systolic CHF (Last EF 29-47% 06/2018), ESRD on MWF HD, GERD, T2DM, PVD presenting with cold sweats and dyspnea. He was examined and evaluated at bedside in ED. He states he was in his usual state of health until Friday after his full dialysis session when he came home with dyspnea. He states his dialysis session was complicated by feelings of malaise and cramping but he got his full treatment of his dialysis. He also mentions that he believes too much of his fluids were taken off during the session because his weight was below his usual dry weight. He states he usually has similar symptoms after dialysis but they improve the day after. However this weekend, he continued to have worsening dyspnea as well as cold sweats. He states that he did not have significant change in diet however he drank a lot of orange juice to 'try to get his sugar up.' When asked about his diabetic regimen, he states once he lost about 100 pounds, he was told he no longer requires diabetic meds and he should just replete orally if his blood sugar runs low. He states he does not have a glucometer at home but assumed he had low sugars based on symptoms. He mentions that he also had palpitations and light-headedness. He also states he had productive cough with white sputum production. His  dyspnea improved with nebulizer treatment. He denies any F/N/V/D/C. Denies chest pain, headaches, hemoptysis. Denies any sick contact.   On chart review, it showed he visited the ED both on last Friday and Saturday and was found to have hypoglycemia with BG lowest at 58.  Of note, he had a thoracentesis on 07/09/18 for dyspnea which resulted in extraction of 1.3L of pleural fluid. However, it appears no studies of the pleural fluid was performed.   In the ED, his chest X-ray showed progression of his left pleural effusion and he was found to have mild troponemia at 0.09. He was started on 2L supplemental oxygen with improvement in his oxygen saturation and IM was consulted for admission.  Meds:  Current Meds  Medication Sig  . albuterol (PROVENTIL HFA;VENTOLIN HFA) 108 (90 Base) MCG/ACT inhaler Inhale 2 puffs into the lungs every 6 (six) hours as needed for wheezing or shortness of breath.  [name] amLODipine (NORVASC) 10 MG tablet Take 1 tablet (10 mg total) by mouth at bedtime.  [name] aspirin EC 81 MG tablet Take 81 mg by mouth daily.  [name] atorvastatin (LIPITOR) 20 MG tablet Take 20 mg by mouth at bedtime.   . cinacalcet (SENSIPAR) 90 MG tablet Take 180 mg by mouth every Monday, Wednesday, and Friday with hemodialysis.   [name] isosorbide mononitrate (  IMDUR) 30 MG 24 hr tablet Take 1 tablet (30 mg total) by mouth daily.  [name] lisinopril (PRINIVIL,ZESTRIL) 10 MG tablet Take 1 tablet (10 mg total) by mouth daily.  . multivitamin (RENA-VIT) TABS tablet TAKE 1 TABLET BY MOUTH ONCE DAILY AT BEDTIME (Patient taking differently: Take 1 tablet by mouth daily. )  . nitroGLYCERIN (NITROSTAT) 0.4 MG SL tablet Place 1 tablet (0.4 mg total) under the tongue every 5 (five) minutes as needed for chest pain.  [name] ondansetron (ZOFRAN) 4 MG tablet Take 1 tablet (4 mg total) by mouth 4 (four) times daily as needed for nausea or vomiting.  . sevelamer carbonate (RENVELA) 800 MG tablet Take 4 tablets (3,200 mg total) by mouth 3 (three)  times daily with meals. (Patient taking differently: Take 1,600-3,200 mg by mouth See admin instructions. Take 4 tablets (3200 mg) by mouth three times daily with meals and 2 tablets (1600 mg) with snacks)    Allergies: Allergies as of 07/21/2018 - Review Complete 07/21/2018  Allergen Reaction Noted  . Enalapril Hives and Rash 04/26/2014  . Latex Rash 09/23/2016  . Tape Rash and Other (See Comments) 09/23/2016   Past Medical History:  Diagnosis Date  . Anemia   . Anginal pain ([place])   . CAD (coronary artery disease)    a. per [facility] s/p 3.79mm x 109mm Vision BMS to mid LAD 12/2009 and Xience DES to mid LAD 10/2010.  [name] Chronic diastolic CHF (congestive heart failure) ([place])   . Colon polyps   . Daily headache   . ESRD on dialysis [facility]) since ~ 2008   "MWF; [facility] R[facility]" (03/04/2017)  . GERD (gastroesophageal reflux disease)   . H/O TIA (transient ischemic attack) 04/01/2015  . H/O TIA (transient ischemic attack) 04/01/2015  . Heart murmur   . Hematochezia    a. 2014: colonscopy, which showed moderately-sized internal hemorrhoids, two 68mm polyps in transverse colon and ascending colon that were resected, five 2-52mm polyps in sigmoid colon, descending colon, transverse colon, and ascending colon that were resected. An upper endoscopy was performed and showed normal esophagus, stomach, and duodenum.  . Hematuria    a. H/o hematuria 2014 with cystoscopy that was unrevealing for his source of hematuria. He underwent a kidney ultrasound on 10/14 that showed mildly echogenic and scarred kidneys compatible with medical renal disease, without hydronephrosis or renal calculi.  [name] History of blood transfusion    "had colonoscopy done; they had to give me some blood"  . History of kidney stones   . Hyperlipidemia   . Hypertension   . On home oxygen therapy    "2L prn" (07/21/2015); "been off it for awhile" (03/04/2017)  . Pneumonia   . Renal insufficiency   . Tuberculosis    "when I was  little; I caught it from my daddy"  . Type II diabetes mellitus ([place])   . Wears dentures     Family History:  Family History  Problem Relation Age of Onset  . Bone cancer Mother   . Anuerysm Father   . Hypertension Unknown   . Diabetes type II Daughter   . Ovarian cancer Sister    Social History:  Lives at home with wife. Used to work as a Training and development officer. Currently retired. Denies any EtOH, Illicit substance use. ~40 pack-year smoking history. Currently smokes 1 cigar every 3-4 days.  Review of Systems: A complete ROS was negative except as per HPI.  Physical Exam: Blood pressure 137/70, pulse 89, temperature 97.7 F (36.5 C),  temperature source Oral, resp. rate (!) 22, height 6' (1.829 m), weight 93.9 kg, SpO2 98 %.  Physical Exam  Constitutional: He is oriented to person, place, and time and well-developed, well-nourished, and in no distress. No distress.  HENT:  Head: Normocephalic and atraumatic.  Mouth/Throat: Oropharynx is clear and moist. No oropharyngeal exudate.  Eyes: Pupils are equal, round, and reactive to light. Conjunctivae and EOM are normal. No scleral icterus.  Neck: Normal range of motion. Neck supple. JVD (6-7cm) present.  Cardiovascular: Normal rate, regular rhythm, normal heart sounds and intact distal pulses.  Pulmonary/Chest: Effort normal. No stridor. He has rales (bilatearl rales up to mid thorax worse. Decreased lung sound on left lower base).  Abdominal: Soft. Bowel sounds are normal. He exhibits no distension. There is no tenderness. There is no guarding.  Musculoskeletal: Normal range of motion. He exhibits no edema, tenderness or deformity.  Lymphadenopathy:    He has no cervical adenopathy.  Neurological: He is alert and oriented to person, place, and time. No cranial nerve deficit. GCS score is 15.  Skin: Skin is warm and dry. He is not diaphoretic.  Dry scabbed ulcers on plantar surface of right 1st distal phlanges  Psychiatric: Mood, memory, affect and  judgment normal.   exam  EKG: personally reviewed my interpretation is normal sinus, normal axis, no ST elevation or depression, no T wave inversions.  CXR: personally reviewed my interpretation is normal mediastinum, left sided pleural effusion (present on prior X-ray), + pulmonary edema,+ sternum surgical wires, well visualized minor fissure.  Assessment & Plan by Problem: Mr.Vandiver is a 62 yo M w/ PMH of Anemia of chronic disease, CAD s/p CABGx2 & mitral valve replacement in 02/3613, systolic CHF (Last EF 43-15% 06/2018), ESRD on MWF HD, GERD, T2DM, PVD presenting with cold sweats and dyspnea. He had multiple ED visits throughout the weekend for similar complaints and was found to have intermittent hypoglycemia of unclear etiology. Chart review shows he left the ED prior to completion of work-up. Currently back for the same symptoms. He is not on any insulin or diabetic regimen. Therefore, most likely having symptoms of hypoglycemia due to impaired renal gluconeogenesis considering his history of ESRD. If he continues to have persistent hypoglycemia he should be worked up for other possible causes, including insulinoma and insulin-secreting neuroendocrine tumors. His dyspnea is explained by worsening pulmonary edema most likely due to hypovolemia in setting of ESRD. Tomorrow is his regularly scheduled dialysis day and we will re-evaluate after dialysis.  Dyspnea w/ productive cough 2/2 pulmonary edema 2/2 ESRD vs Mitral Valve stenosis Was observed desatting to 88 on RA. Back to 99 on O2 [place] 2L. No home oxygen requirement. No prior PFTs. Some improvement with Proventil. Chest X-ray show significant pulmonary edema and progression of left pleural effusion. Due for dialysis tomorrow AM on regular schedule. - Nephro consult for inpatient dialysis tomorrow AM - C/w Proventil 2.5mg  q6hr PRN - Consider cardiology consult in AM for TEE to evaluated mitral valve stenosis contributing to dyspnea - Keep O2 sat  above 88 - C/w home meds: asa 81mg  daily, atorvastatin 20mg  qhs, isosorbide mono  Chills, Cold Sweats, Malaise 2/2 hypoglycemia 2/2 impaired renal gluconeogenesis vs insulinoma Unclear cause of hypoglycemia. bg 55 during last ED visit on cbg but not on bmp. Presenting BG during this admission 96. No home diabetic regimen. 2/3 on [place] - CBG now - If significantly low, stat BMP to confirm - If persistent hypoglycemia, begin hypoglycemia work up (serum  insulin, c-peptide, BHOB, pro-insulin, sulfonylurea) - Treat w/ oral intake  ESRD on HD MWF Post-dialysis creatinines 4.67. Current creatinine 10.17. Appear fluid overloaded w/ pulmonary edema on chest X-ray. K 4.6 - Nephro consult for inpatient dialysis in morning - C/w home meds: cinacalcet 180mg  on dialysis days, sevelamer carbonate 32000mg  TID  Troponemia 2/2 ESRD vs demand ischemia No baseline troponemia on chart review. I-start troponin 0.09 in ED. 1st troponin 0.08. EKG unchanged from prior w/o new ST elevation or depression. Not endorsing chest pain. - Trend troponins  CAD s/p CABG x2 Currently not endorsing chest pain - c/w home meds: isosorbide mononitrate 30mg  daily, atorvastatin 20mg  daily, asa 81mg  daily  HTN 137/70 on admit - c/w home meds: amlodipine 10mg  qhs, lisinopril 10mg  daily  DVT prophx: subqheparin Diet: Renal/Cardiac w/ fluid restriction Bowel: Senokot Code: Full  Dispo: Admit patient to Inpatient with expected length of stay greater than 2 midnights.  Signed: Mosetta Anis, MD 07/21/2018, 2:26 AM  Pager: [phone]

## 2018-07-21 NOTE — Progress Notes (Signed)
Subjective: Patient was seen and evaluated at bedside today. No acute events overnight. SOB improved. Complained of some nausea early morning that got better. But mentions that he is ready to get his HD. No other acute complaints. No chest pain.   Objective:  Vital signs in last 24 hours: Vitals:   07/21/18 0045 07/21/18 0130 07/21/18 0302 07/21/18 0824  BP: (!) 141/80 137/70 (!) 163/90 (!) 158/86  Pulse: 92 89 90 89  Resp: 14 (!) 22 20 18   Temp:   98.4 F (36.9 C) 97.7 F (36.5 C)  TempSrc:    Oral  SpO2: 96% 98% 98% 96%  Weight:      Height:       Physical Exam  Constitutional: He is oriented to person, place, and time. He appears well-developed and well-nourished. No distress.  HENT:  Head: Normocephalic and atraumatic.  Neck: JVD present.  Cardiovascular: Normal rate, regular rhythm and intact distal pulses.  Systolic? Murmur heard. Pulmonary/Chest: Effort normal. No respiratory distress. He has rhonchi in the right lower field and the left lower field. He has rales in the right lower field and the left lower field.  Abdominal: Soft. There is no tenderness.  Musculoskeletal:       Right lower leg: He exhibits no edema.       Left lower leg: He exhibits no edema.  Neurological: He is alert and oriented to person, place, and time.   Assessment/Plan:  Active Problems:   ESRD (end stage renal disease) (HCC)   Shortness of breath   Mitral stenosis  [name] is a 62 yo M w/ PMH of Anemia of chronic disease, CAD s/p CABGx2 & mitral valve replacement in 11/7739, systolic CHF (Last EF 28-78% 06/2018), ESRD on MWF HD ,GERD, T2DM, PVD presenting with cold sweats and dyspnea and weakness. 62 year old male with history of end-stage renal disease on hemodialysis had and comes in with complaints of dyspnea.   SOB, weakness:  On arrival has had some bibasilar crackle. Afebrile and normal WBC. Troponin was slightly higher than his baseline. No acute ST-T changes on  ECG. Initial CXR  showed cardiomegaly, vascular congestion with questionable edema. His O2 sat improved with nasal O2 at ED. With Hx of ESRD. Also has Hx of Bioprosthetic MVR with again MV stenosis on repeat echo.   SOB is likely in setting of volume overload 2/2 ESRD/ pulmonary edema 2/2 MS.  Less likely to be PNA, give no fever and Nl WBC. Less likely to be ACS havingTrop trended down.   -Consult cardiology for TEE--> Cardio planned for TEE tomorrow -Consult nephro for HD today--> Planned to do HD today   ESRD on FH MWF: last dialysis on Friday October 11. Denies extra fluid intake. Presented with SOB, Weight up to 3 kg higher than dry weight on admission. Has had some volume overload on admission but improved and stable now. Hd is due today. No indication for urgent H.D now  -Consult nephro for HD today--> Planned for non urgent HD today and reassess tomorrow to see if he needs another HD tomorrow -C/w Sevelamer 3200 TID C/W cinacalcet 180 mg MWF  MS: s/p bioprosthetic valve replacement on 12/2017, cardiology consulted and planned to do TEE tomorrow  CAD s/p CABG Troponemia: Asymptomatic. Trended down. No acute ST-T changes on EKG.  Probably in setting of ESRD.    Ref Range & Units 14:16 (07/21/18) 07:18 (07/21/18) 01:55 (07/21/18)     Troponin I <0.03 ng/mL 0.07High Panic   0.09High  Panic  CM 0.28FVWA Panic  CM      -Stop trending Troponin -C/w home dose of ASA 80 mg QD, Isosorbide mononitrate 30 mg QD, Lipitor 20 mg QD   Hypoglycemia: With Hx of Ed admission due to hypoglycemia. (was in the emergency department 1 day before admission with hypoglycemia.) On no DM meds for years. Denies any possibility of taking DM meds at home. No hypoglycemia event since this admission. -CBG monitoring--> 72, 84, 93 this AM -If hypoglycemic, check serum blood glucose and if elevated, check Insulin, c-peptide before giving Glucose  HTN: BP today: 150s-160s/80-90. High BP likely 2/2 volume overload. Will have  HD today. -c/w home dose of Amlodipine 10 mg QD and lisinopril 10 mg QD   Diet: Renal with fluid restriction, NPO after midnight Code: Full IV fluid: none, (fluid restriction) VTE ppx: Heparin    Dispo: Anticipated discharge in approximately 1-2 days, as clinically improved  [name] Hatch, MD 07/21/2018, 3:23 PM Pager: [phone]

## 2018-07-21 NOTE — ED Provider Notes (Signed)
[facility] EMERGENCY DEPARTMENT Provider Note   CSN: [id number] Arrival date & time: 07/21/18  0016     History   Chief Complaint Chief Complaint  Patient presents with  . Shortness of Breath    HPI [name] [name] is a 62 y.o. male.  The history is provided by the patient and medical records.  Shortness of Breath      62 year old male with history of anemia, coronary artery disease s/p stenting and CABGx3, congestive heart failure, end-stage renal disease on hemodialysis, GERD, chronic hematuria, diabetes, presenting to the ED with shortness of breath.  States this just started today and seems to come in waves.  States each time it happens it seems to get a little worse.  He denies any chest pain.  He has not had any fever, cough, or other upper respiratory symptoms.  No known sick contacts.  States his last dialysis session was on Friday as scheduled (he is MWF schedule), normal session.  Does not check his weight regularly so unsure of any weight gain.  Attends [facility] [address] [facility], followed by Dr. Posey Pronto.  Past Medical History:  Diagnosis Date  . Anemia   . Anginal pain ([place])   . CAD (coronary artery disease)    a. per [facility] s/p 3.11mm x 67mm Vision BMS to mid LAD 12/2009 and Xience DES to mid LAD 10/2010.  [name] Chronic diastolic CHF (congestive heart failure) ([place])   . Colon polyps   . Daily headache   . ESRD on dialysis [facility]) since ~ 2008   "MWF; Jeneen Rinks" (03/04/2017)  . GERD (gastroesophageal reflux disease)   . H/O TIA (transient ischemic attack) 04/01/2015  . H/O TIA (transient ischemic attack) 04/01/2015  . Heart murmur   . Hematochezia    a. 2014: colonscopy, which showed moderately-sized internal hemorrhoids, two 10mm polyps in transverse colon and ascending colon that were resected, five 2-84mm polyps in sigmoid colon, descending colon, transverse colon, and ascending colon that were resected. An upper endoscopy was performed and  showed normal esophagus, stomach, and duodenum.  . Hematuria    a. H/o hematuria 2014 with cystoscopy that was unrevealing for his source of hematuria. He underwent a kidney ultrasound on 10/14 that showed mildly echogenic and scarred kidneys compatible with medical renal disease, without hydronephrosis or renal calculi.  [name] History of blood transfusion    "had colonoscopy done; they had to give me some blood"  . History of kidney stones   . Hyperlipidemia   . Hypertension   . On home oxygen therapy    "2L prn" (07/21/2015); "been off it for awhile" (03/04/2017)  . Pneumonia   . Renal insufficiency   . 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 Type II diabetes mellitus ([place])   . Wears dentures     Patient Active Problem List   Diagnosis Date Noted  . Shoulder pain, right 07/10/2018  . Pleural effusion on left   . ESRD on dialysis ([place])   . SOB (shortness of breath) 06/06/2018  . Critical lower limb ischemia 05/27/2018  . Hx of CABG 02/02/2018  . Hypervolemia associated with renal insufficiency 01/26/2018  . Hyperkalemia 01/26/2018  . Restless leg syndrome 01/14/2018  . Anemia 01/14/2018  . Tobacco use 10/29/2017  . Mild cognitive impairment 06/13/2017  . Chest pain 06/07/2017  . Skin ulcer of toe of right foot, limited to breakdown of skin ([place])   . Callus  of foot 03/07/2017  . Pulmonary hypertension ([place]) 07/28/2016  . Stroke-like symptoms 06/30/2016  . Peripheral neuropathy 05/07/2016  . S/P coronary artery stent placement 05/07/2016  . Nausea and vomiting 04/02/2016  . ESRD (end stage renal disease) ([place]) 03/18/2016  . Mitral regurgitation   . Diabetic neuropathy ([place]) 07/29/2015  . Depression 07/22/2015  . GERD (gastroesophageal reflux disease) 06/04/2015  . Malnutrition of moderate degree ([place]) 05/17/2015  . Thrombocytopenia ([place]) 05/16/2015  . Weight loss 05/16/2015  . Diastolic dysfunction-grade 2 12/13/2014  . HCAP (healthcare-associated  pneumonia) 10/01/2014  . 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 Arm pain, right 05/25/2014  . Numbness 05/25/2014  . Hypertension 04/27/2014  . CAD -S/P LAD BMS 2011, LAD DES 2012- patent cors Feb 2016 04/27/2014  . Diabetes mellitus type 2, diet-controlled ([place]) 04/27/2014  . Background diabetic retinopathy ([place]) 08/13/2012  . Primary localized osteoarthrosis, lower leg 04/15/2012  . Senile nuclear sclerosis 11/29/2010  . Vitreous hemorrhage ([place]) 11/29/2010  . Atherosclerotic heart disease of native coronary artery without angina pectoris 12/12/2009  . Hypercholesterolemia 11/30/2003    Past Surgical History:  Procedure Laterality Date  . ABDOMINAL AORTOGRAM W/LOWER EXTREMITY N/A 05/29/2018   Procedure: ABDOMINAL AORTOGRAM W/LOWER EXTREMITY;  Surgeon: Lorretta Harp, MD;  Location: [facility] CV LAB;  Service: Cardiovascular;  Laterality: N/A;  . AV FISTULA PLACEMENT Left ~ 2007   "upper arm"  . CARDIAC CATHETERIZATION  "several"  . CORONARY ANGIOPLASTY WITH STENT PLACEMENT  "several"  . CORONARY ARTERY BYPASS GRAFT     3 grafts  . CYSTOSCOPY W/ STONE MANIPULATION  X2?  . EYE SURGERY Bilateral    "laser OR for hemorrhage"  . IR THORACENTESIS ASP PLEURAL SPACE W/IMG GUIDE  07/09/2018  . LEFT HEART CATHETERIZATION WITH CORONARY ANGIOGRAM N/A 11/23/2014   Procedure: LEFT HEART CATHETERIZATION WITH CORONARY ANGIOGRAM;  Surgeon: Troy Sine, MD;  Location: [facility] CATH LAB;  Service: Cardiovascular;  Laterality: N/A;  . LITHOTRIPSY  X1  . MITRAL VALVE REPLACEMENT    . REVISON OF ARTERIOVENOUS FISTULA Left 39/76/7341   Procedure: PLICATION OF LEFT ARM ARTERIOVENOUS FISTULA;  Surgeon: Angelia Mould, MD;  Location: [facility];  Service: Vascular;  Laterality: Left;  . REVISON OF ARTERIOVENOUS FISTULA Left 9/37/9024   Procedure: PLICATE ANEURYSM  OF LEFT ARTERIOVENOUS FISTULA;  Surgeon: Waynetta Sandy, MD;  Location: [facility];  Service: Vascular;  Laterality: Left;         Home Medications    Prior to Admission medications   Medication Sig Start Date End Date Taking? Authorizing Provider  albuterol (PROVENTIL HFA;VENTOLIN HFA) 108 (90 Base) MCG/ACT inhaler Inhale 2 puffs into the lungs every 6 (six) hours as needed for wheezing or shortness of breath. 06/07/18   Neva Seat, MD  amLODipine (NORVASC) 10 MG tablet Take 1 tablet (10 mg total) by mouth at bedtime. 04/17/18   Neva Seat, MD  aspirin EC 81 MG tablet Take 81 mg by mouth daily.    [provider]  atorvastatin (LIPITOR) 20 MG tablet Take 20 mg by mouth at bedtime.     [provider]  cadexomer iodine (IODOSORB) 0.9 % gel Apply 1 application topically daily as needed for wound care. Patient not taking: Reported on 05/27/2018 05/06/18   Landis Martins, DPM  cinacalcet (SENSIPAR) 90 MG tablet Take 180 mg by mouth every Monday, Wednesday, and Friday with hemodialysis.     [provider]  isosorbide mononitrate (IMDUR) 30 MG 24 hr tablet Take 1 tablet (30  mg total) by mouth daily. 07/10/18 07/10/19  Neva Seat, MD  lisinopril (PRINIVIL,ZESTRIL) 10 MG tablet Take 1 tablet (10 mg total) by mouth daily. 07/10/18   Neva Seat, MD  multivitamin (RENA-VIT) TABS tablet TAKE 1 TABLET BY MOUTH ONCE DAILY AT BEDTIME 07/02/18   Neva Seat, MD  nitroGLYCERIN (NITROSTAT) 0.4 MG SL tablet Place 1 tablet (0.4 mg total) under the tongue every 5 (five) minutes as needed for chest pain. 07/10/18   Neva Seat, MD  ondansetron (ZOFRAN) 4 MG tablet Take 1 tablet (4 mg total) by mouth 4 (four) times daily as needed for nausea or vomiting. 03/04/18   Wynona Luna, MD  rOPINIRole (REQUIP) 0.25 MG tablet Take 1 tablet (0.25 mg total) by mouth at bedtime. Patient not taking: Reported on 07/18/2018 02/25/18   Bartholomew Crews, MD  sevelamer carbonate (RENVELA) 800 MG tablet Take 4 tablets (3,200 mg total) by mouth 3 (three) times daily with meals. Patient  taking differently: Take 1,600-3,200 mg by mouth See admin instructions. Take 4 tablets (3200 mg) by mouth three times daily with meals and 2 tablets (1600 mg) with snacks 03/12/16   Eulogio Bear U, DO  traMADol (ULTRAM) 50 MG tablet Take 1 tablet (50 mg total) by mouth every 6 (six) hours as needed. Patient not taking: Reported on 07/18/2018 05/20/18   Landis Martins, DPM    Family History Family History  Problem Relation Age of Onset  . Bone cancer Mother   . Anuerysm Father   . Hypertension Unknown   . Diabetes type II Daughter     Social History Social History   Tobacco Use  . Smoking status: Former Smoker    Packs/day: 0.30    Years: 8.00    Pack years: 2.40    Types: Cigars  . Smokeless tobacco: Never Used  Substance Use Topics  . Alcohol use: No    Alcohol/week: 0.0 standard drinks  . Drug use: No     Allergies   Enalapril; Latex; and Tape   Review of Systems Review of Systems  Respiratory: Positive for shortness of breath.   All other systems reviewed and are negative.    Physical Exam Updated Vital Signs BP (!) 159/91 (BP Location: Right Arm)   Pulse 94   Temp 97.7 F (36.5 C) (Oral)   Resp (!) 25   Ht 6' (1.829 m)   Wt 93.9 kg   SpO2 92%   BMI 28.07 kg/m   Physical Exam  Constitutional: He is oriented to person, place, and time. He appears well-developed and well-nourished.  HENT:  Head: Normocephalic and atraumatic.  Mouth/Throat: Oropharynx is clear and moist.  Eyes: Pupils are equal, round, and reactive to light. Conjunctivae and EOM are normal.  Neck: Normal range of motion.  Cardiovascular: Normal rate, regular rhythm and normal heart sounds.  Pulmonary/Chest: Effort normal and breath sounds normal. He has no decreased breath sounds. He has no wheezes. He has no rales.  Abdominal: Soft. Bowel sounds are normal.  Musculoskeletal: Normal range of motion.  Neurological: He is alert and oriented to person, place, and time.  Skin: Skin is  warm and dry.  Psychiatric: He has a normal mood and affect.  Nursing note and vitals reviewed.    ED Treatments / Results  Labs (all labs ordered are listed, but only abnormal results are displayed) Labs Reviewed  CBC WITH DIFFERENTIAL/PLATELET - Abnormal; Notable for the following components:      Result Value   RBC 3.83 (*)  Hemoglobin 9.5 (*)    HCT 30.9 (*)    MCH 24.8 (*)    RDW 18.7 (*)    Eosinophils Absolute 0.9 (*)    All other components within normal limits  BASIC METABOLIC PANEL - Abnormal; Notable for the following components:   Chloride 91 (*)    BUN 43 (*)    Creatinine, Ser 10.17 (*)    Calcium 8.8 (*)    GFR calc non Af Amer 5 (*)    GFR calc Af Amer 6 (*)    Anion gap 16 (*)    All other components within normal limits  I-STAT TROPONIN, ED - Abnormal; Notable for the following components:   Troponin i, poc 0.09 (*)    All other components within normal limits    EKG EKG Interpretation  Date/Time:  Monday July 21 2018 00:23:32 EDT Ventricular Rate:  94 PR Interval:    QRS Duration: 108 QT Interval:  398 QTC Calculation: 498 R Axis:   78 Text Interpretation:  Sinus rhythm Low voltage, extremity leads Nonspecific T abnormalities, lateral leads Borderline prolonged QT interval When compared with ECG of 07/06/2018, No significant change was found Confirmed by Delora Fuel ([id number]) on 07/21/2018 12:26:18 AM   Radiology Dg Chest 2 View  Result Date: 07/21/2018 CLINICAL DATA:  Intermittent shortness of breath since last night. EXAM: CHEST - 2 VIEW COMPARISON:  07/18/2018 FINDINGS: Postoperative changes in the mediastinum. Cardiac enlargement. Pulmonary vascular congestion. Perihilar infiltration likely edema. Small left pleural effusion with basilar atelectasis or consolidation. Appearances are similar to previous study. No pneumothorax. Mediastinal contours appear intact. IMPRESSION: Cardiac enlargement with pulmonary vascular congestion and perihilar  edema. Small left pleural effusion with basilar atelectasis or consolidation. Electronically Signed   By: Lucienne Capers M.D.   On: 07/21/2018 00:58    Procedures Procedures (including critical care time)  CRITICAL CARE Performed by: Larene Pickett   Total critical care time: 35 minutes  Critical care time was exclusive of separately billable procedures and treating other patients.  Critical care was necessary to treat or prevent imminent or life-threatening deterioration.  Critical care was time spent personally by me on the following activities: development of treatment plan with patient and/or surrogate as well as nursing, discussions with consultants, evaluation of patient's response to treatment, examination of patient, obtaining history from patient or surrogate, ordering and performing treatments and interventions, ordering and review of laboratory studies, ordering and review of radiographic studies, pulse oximetry and re-evaluation of patient's condition.   Medications Ordered in ED Medications - No data to display   Initial Impression / Assessment and Plan / ED Course  I have reviewed the triage vital signs and the nursing notes.  Pertinent labs & imaging results that were available during my care of the patient were reviewed by me and considered in my medical decision making (see chart for details).  62 y.o. M here with SOB.  Has been intermittent throughout the day today, no exertional component.  He denies any chest pain.  On arrival patient is afebrile and nontoxic.  O2 sats around 92% on room air.  He does not use home oxygen.  He has not missed any recent dialysis sessions, due for treatment in the morning.  Does not check his weight so unsure about any increased fluid retention.  We will plan for labs and chest x-ray.  EKG unchanged from prior.  Labs with elevated troponin at 0.09.  Patient does not have chronically elevated troponins, last  positive marker in 2017.   Labs are overall reassuring considering his dialysis status.  His potassium is normal.  Chest x-ray with some cardiomegaly, vascular congestion and questionable edema.  Patient was started on 2 L supplemental oxygen with good improvement, saturations now in the upper 90s.  States he feels more comfortable.  Continues to deny any chest pain.  At this point, feel this is likely from fluid overload.  Less likely CAP given no fever or cough and normal WBC count.  Discussed with IM teaching service, they will admit for ongoing care.  Final Clinical Impressions(s) / ED Diagnoses   Final diagnoses:  Elevated troponin    ED Discharge Orders    None       Larene Pickett, PA-C 69/22/30 [id number]    Delora Fuel, MD 49/97/18 [phone]

## 2018-07-21 NOTE — ED Notes (Signed)
Dr Roxanne Mins informed of troponin results .09

## 2018-07-21 NOTE — ED Triage Notes (Signed)
Pt presents with SOB intermittently since last night; pt states comes on randomly; EMS reporting normal VS enroute, gave 5mg  albuterol ; pt denies CP

## 2018-07-21 NOTE — Consult Note (Addendum)
Renal Service Consult Note [name]  [name] [name] 07/21/2018 Sol [place]azing Requesting Physician:  Dr Heber [place], [name].   Reason for Consult:  ESRD pt with SOB 2/2 volume overload.  HPI: The patient is a 62 y.o. year-old w/ hx of CAD w stents, HTN and ESRD on HD tiw at [facility] clinic, presenting w/ chest pain to ED today, also SOB.  CXR in ED showed bilat CHF.  We are asked to see for dilaysis.   Seen and examined at bedside.  Reports SOB starting this morning. Believes he has loss weight due to decreased appetite and nausea last week.  Attended dialysis on Friday where he says the was under his dry weight on arrival.  Has been on dialysis for 10 years and has been compliant with prescribed dialysis regimen.  Denies chest pain, n/v/d, abdominal pain, edema, and weakness.     ROS  denies CP  no joint pain   no HA  no blurry vision  no rash  no diarrhea  no nausea/ vomiting  no dysuria  no difficulty voiding  no change in urine color    Past Medical History  Past Medical History:  Diagnosis Date  . Anemia   . Anginal pain ([place])   . CAD (coronary artery disease)    a. per [facility] s/p 3.81mm x 25mm Vision BMS to mid LAD 12/2009 and Xience DES to mid LAD 10/2010.  [name] Chronic diastolic CHF (congestive heart failure) ([place])   . Colon polyps   . Daily headache   . ESRD on dialysis [name]) since ~ 2008   "MWF; Jeneen Rinks" (03/04/2017)  . GERD (gastroesophageal reflux disease)   . H/O TIA (transient ischemic attack) 04/01/2015  . H/O TIA (transient ischemic attack) 04/01/2015  . Heart murmur   . Hematochezia    a. 2014: colonscopy, which showed moderately-sized internal hemorrhoids, two 14mm polyps in transverse colon and ascending colon that were resected, five 2-48mm polyps in sigmoid colon, descending colon, transverse colon, and ascending colon that were resected. An upper endoscopy was performed and showed normal esophagus, stomach, and duodenum.  . Hematuria    a.  H/o hematuria 2014 with cystoscopy that was unrevealing for his source of hematuria. He underwent a kidney ultrasound on 10/14 that showed mildly echogenic and scarred kidneys compatible with medical renal disease, without hydronephrosis or renal calculi.  [name] History of blood transfusion    "had colonoscopy done; they had to give me some blood"  . History of kidney stones   . Hyperlipidemia   . Hypertension   . On home oxygen therapy    "2L prn" (07/21/2015); "been off it for awhile" (03/04/2017)  . Pneumonia   . Renal insufficiency   . 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 Type II diabetes mellitus ([place])   . Wears dentures    Past Surgical History  Past Surgical History:  Procedure Laterality Date  . ABDOMINAL AORTOGRAM W/LOWER EXTREMITY N/A 05/29/2018   Procedure: ABDOMINAL AORTOGRAM W/LOWER EXTREMITY;  Surgeon: Lorretta Harp, MD;  Location: [facility] CV LAB;  Service: Cardiovascular;  Laterality: N/A;  . AV FISTULA PLACEMENT Left ~ 2007   "upper arm"  . CARDIAC CATHETERIZATION  "several"  . CORONARY ANGIOPLASTY WITH STENT PLACEMENT  "several"  . CORONARY ARTERY BYPASS GRAFT     3 grafts  . CYSTOSCOPY W/ STONE MANIPULATION  X2?  . EYE SURGERY Bilateral    "laser OR for  hemorrhage"  . IR THORACENTESIS ASP PLEURAL SPACE W/IMG GUIDE  07/09/2018  . LEFT HEART CATHETERIZATION WITH CORONARY ANGIOGRAM N/A 11/23/2014   Procedure: LEFT HEART CATHETERIZATION WITH CORONARY ANGIOGRAM;  Surgeon: Troy Sine, MD;  Location: [name] CATH LAB;  Service: Cardiovascular;  Laterality: N/A;  . LITHOTRIPSY  X1  . MITRAL VALVE REPLACEMENT    . REVISON OF ARTERIOVENOUS FISTULA Left 43/15/4008   Procedure: PLICATION OF LEFT ARM ARTERIOVENOUS FISTULA;  Surgeon: Angelia Mould, MD;  Location: [facility];  Service: Vascular;  Laterality: Left;  . REVISON OF ARTERIOVENOUS FISTULA Left 6/[birth year]/1950   Procedure: PLICATE ANEURYSM  OF LEFT ARTERIOVENOUS FISTULA;  Surgeon: Waynetta Sandy, MD;  Location: [name];  Service: Vascular;  Laterality: Left;   Family History  Family History  Problem Relation Age of Onset  . Bone cancer Mother   . Anuerysm Father   . Hypertension Unknown   . Diabetes type II Daughter   . Ovarian cancer Sister    Social History  reports that he has quit smoking. His smoking use included cigars. He has a 2.40 pack-year smoking history. He has never used smokeless tobacco. He reports that he does not drink alcohol or use drugs. Allergies  Allergies  Allergen Reactions  . Enalapril Hives and Rash  . Latex Rash  . Tape Rash and Other (See Comments)    TAPE MAKES SKIN BREAK OUT AND TURN RED   Home medications Prior to Admission medications   Medication Sig Start Date End Date Taking? Authorizing Provider  albuterol (PROVENTIL HFA;VENTOLIN HFA) 108 (90 Base) MCG/ACT inhaler Inhale 2 puffs into the lungs every 6 (six) hours as needed for wheezing or shortness of breath. 06/07/18  Yes Neva Seat, MD  amLODipine (NORVASC) 10 MG tablet Take 1 tablet (10 mg total) by mouth at bedtime. 04/17/18  Yes Neva Seat, MD  aspirin EC 81 MG tablet Take 81 mg by mouth daily.   Yes [provider]  atorvastatin (LIPITOR) 20 MG tablet Take 20 mg by mouth at bedtime.    Yes [provider]  cinacalcet (SENSIPAR) 90 MG tablet Take 180 mg by mouth every Monday, Wednesday, and Friday with hemodialysis.    Yes [provider]  isosorbide mononitrate (IMDUR) 30 MG 24 hr tablet Take 1 tablet (30 mg total) by mouth daily. 07/10/18 07/10/19 Yes Neva Seat, MD  lisinopril (PRINIVIL,ZESTRIL) 10 MG tablet Take 1 tablet (10 mg total) by mouth daily. 07/10/18  Yes Neva Seat, MD  multivitamin (RENA-VIT) TABS tablet TAKE 1 TABLET BY MOUTH ONCE DAILY AT BEDTIME Patient taking differently: Take 1 tablet by mouth daily.  07/02/18  Yes Neva Seat, MD  nitroGLYCERIN (NITROSTAT) 0.4 MG SL tablet Place 1 tablet (0.4  mg total) under the tongue every 5 (five) minutes as needed for chest pain. 07/10/18  Yes Neva Seat, MD  ondansetron (ZOFRAN) 4 MG tablet Take 1 tablet (4 mg total) by mouth 4 (four) times daily as needed for nausea or vomiting. 03/04/18  Yes Wynona Luna, MD  sevelamer carbonate (RENVELA) 800 MG tablet Take 4 tablets (3,200 mg total) by mouth 3 (three) times daily with meals. Patient taking differently: Take 1,600-3,200 mg by mouth See admin instructions. Take 4 tablets (3200 mg) by mouth three times daily with meals and 2 tablets (1600 mg) with snacks 03/12/16  Yes Vann, Jessica U, DO  cadexomer iodine (IODOSORB) 0.9 % gel Apply 1 application topically daily as needed for wound care. Patient not taking: Reported  on 05/27/2018 05/06/18   Landis Martins, DPM  rOPINIRole (REQUIP) 0.25 MG tablet Take 1 tablet (0.25 mg total) by mouth at bedtime. Patient not taking: Reported on 07/18/2018 02/25/18   Bartholomew Crews, MD  traMADol (ULTRAM) 50 MG tablet Take 1 tablet (50 mg total) by mouth every 6 (six) hours as needed. Patient not taking: Reported on 07/18/2018 05/20/18   Landis Martins, DPM   Liver Function Tests Recent Labs  Lab 07/19/18 2135  AST 18  ALT 11  ALKPHOS 55  BILITOT 0.8  PROT 8.2*  ALBUMIN 3.7   CBC Recent Labs  Lab 07/18/18 1601 07/19/18 2135 07/21/18 0025  WBC 7.4 8.0 7.0  NEUTROABS  --   --  4.4  HGB 10.1* 10.4* 9.5*  HCT 32.6* 33.7* 30.9*  MCV 81.5 81.8 80.7  PLT 202 194 952   Basic Metabolic Panel Recent Labs  Lab 07/18/18 1334 07/18/18 1601 07/19/18 2135 07/21/18 0025  NA 137 135 134* 136  K 3.8 3.7 3.9 4.6  CL 91* 92* 90* 91*  CO2  --  31 29 29   GLUCOSE 87 113* 109* 96  BUN 13 14 29* 43*  CREATININE 4.50* 4.67* 8.05* 10.17*  CALCIUM  --  8.1* 9.5 8.8*   Iron/TIBC/Ferritin/ %Sat    Component Value Date/Time   IRON 26 (L) 01/14/2018 1030   TIBC 220 (L) 01/14/2018 1030   FERRITIN 1,111 (H) 01/14/2018 1030   IRONPCTSAT 12 (L)  01/14/2018 1030    Vitals:   07/21/18 0045 07/21/18 0130 07/21/18 0302 07/21/18 0824  BP: (!) 141/80 137/70 (!) 163/90 (!) 158/86  Pulse: 92 89 90 89  Resp: 14 (!) 22 20 18   Temp:   98.4 F (36.9 C) 97.7 F (36.5 C)  TempSrc:    Oral  SpO2: 96% 98% 98% 96%  Weight:      Height:       Exam Gen NAD, WDWN, pleasant male No rash, cyanosis or gangrene Sclera anicteric, throat clear   No jvd or bruits  Chest +crackles b/l R>L RRR +8/4 systolic murmur Abd soft ntnd no mass or ascites +bs  MS no joint effusions or deformity  Ext no edema, no wounds or ulcers  Neuro is alert, Ox 3 , nf     Home meds:  - amlodipine 10/ lisinopril 10 qd  - aspirin 81/ atorvastatin 20 qd/ isosorbide mononitrate 30 qd/ sl ntg prn  - ropinirole 0.25 mg hs/ tramadol 50 qid prn  - sevelamer carbonate 4 tabs ac tid/ cinacalcet 180 tiw  - vitamins/ prns/ hfa   Dialysis: MWF G[facility]  4h  500/AF 2.0   91kg  2/2 bath  P2  AVF  Hep 8000  - sensipar 180  - mircera 75 ug last 10/9  - vit D 0.5 ug q hd  Impression/ Plan: 1. SOB 2/2 volume overload - CXR shows pulmonary edema, per patient losing weight.  Plan for HD today with net UF goal 3-4L as tolerated. Will reassess and determine if extra HD needed tomorrow.  2. ESRD - on HD MWF. K 4.6. HD today per regular schedule 3. Anemia of CKD - Hgb 9.5. mircera recently given. Follow trends. 4. Metabolic bone disease - Ca 8.8, check phos - continue sensipar, VDRA and binders. 5. HTN- BP elevated, likely partially due to increased volume.  Expect improvement post HD. Continue home meds.  6. Nutrition - Renal diet w/fluid restrictions.  7.  CAD s/p stenting and CABG 8. GERD 9. DMT2  Kelly Splinter MD [employer] pager [phone]   07/21/2018, 10:01 AM

## 2018-07-21 NOTE — ED Notes (Signed)
Pt back from xray, will continue to monitor.

## 2018-07-21 NOTE — Consult Note (Addendum)
Cardiology Consultation:   Patient ID: [name] [name] MRN: [id number]; DOB: April 13, 1956  Admit date: 07/21/2018 Date of Consult: 07/21/2018  Primary Care Provider: Neva Seat, MD Primary Cardiologist: No primary care provider on file. Primary Electrophysiologist:  None   Patient Profile:   [name] [name] is a 62 y.o. male with a hx of CAD (s/p stents 2011,2012; s/p CABG x2 12/2017); ESRD (on HD MWF); PVD; CHF (EF 50-55%) who is being seen today for the evaluation of Dyspnea and Stenosis of prosthetic valve at the request of Dr. Myrtie Hawk.  History of Present Illness:   [name] [name] presented on 10/13 with 3 days of dyspnea, which began following his dialysis session on Friday. He states that the dyspnea is similar to episodes he has had following dialysis in the past, but this episode was significantly worse (and usually it resolves within a day). He states that his prior cardiac symptoms were chest pain and not shortness of breath. He states that over the weekend he was nauseous, but this has resolved. He also endorses a clammy feeling and a dry cough.. He states his Dyspnea and Clamminess have improved, but remain persistent. He denies fever, chills, nausea, chest pain, or palpitations. 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 We have consulted for consideration of TEE and further workup of patients dyspnea and valvular disease.  Past Medical History:  Diagnosis Date  . Anemia   . Anginal pain ([place])   . CAD (coronary artery disease)    a. per [facility] s/p 3.74mm x 54mm [facility] BMS to mid LAD 12/2009 and Xience DES to mid LAD 10/2010.  [name] Chronic diastolic CHF (congestive heart failure) ([place])   . Colon polyps   . Daily headache   . ESRD on dialysis [facility]) since ~ 2008   "MWF; Jeneen Rinks" (03/04/2017)  . GERD (gastroesophageal reflux disease)   . H/O TIA (transient ischemic attack) 04/01/2015  . H/O TIA (transient ischemic  attack) 04/01/2015  . Heart murmur   . Hematochezia    a. 2014: colonscopy, which showed moderately-sized internal hemorrhoids, two 62mm polyps in transverse colon and ascending colon that were resected, five 2-76mm polyps in sigmoid colon, descending colon, transverse colon, and ascending colon that were resected. An upper endoscopy was performed and showed normal esophagus, stomach, and duodenum.  . Hematuria    a. H/o hematuria 2014 with cystoscopy that was unrevealing for his source of hematuria. He underwent a kidney ultrasound on 10/14 that showed mildly echogenic and scarred kidneys compatible with medical renal disease, without hydronephrosis or renal calculi.  [name] History of blood transfusion    "had colonoscopy done; they had to give me some blood"  . History of kidney stones   . Hyperlipidemia   . Hypertension   . On home oxygen therapy    "2L prn" (07/21/2015); "been off it for awhile" (03/04/2017)  . Pneumonia   . Renal insufficiency   . 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 Type II diabetes mellitus ([place])   . Wears dentures     Past Surgical History:  Procedure Laterality Date  . ABDOMINAL AORTOGRAM W/LOWER EXTREMITY N/A 05/29/2018   Procedure: ABDOMINAL AORTOGRAM W/LOWER EXTREMITY;  Surgeon: Lorretta Harp, MD;  Location: [place] CV LAB;  Service: Cardiovascular;  Laterality: N/A;  . AV FISTULA PLACEMENT Left ~ 2007   "upper arm"  . CARDIAC CATHETERIZATION  "several"  .  CORONARY ANGIOPLASTY WITH STENT PLACEMENT  "several"  . CORONARY ARTERY BYPASS GRAFT     3 grafts  . CYSTOSCOPY W/ STONE MANIPULATION  X2?  . EYE SURGERY Bilateral    "laser OR for hemorrhage"  . IR THORACENTESIS ASP PLEURAL SPACE W/IMG GUIDE  07/09/2018  . LEFT HEART CATHETERIZATION WITH CORONARY ANGIOGRAM N/A 11/23/2014   Procedure: LEFT HEART CATHETERIZATION WITH CORONARY ANGIOGRAM;  Surgeon: Troy Sine, MD;  Location: [facility] CATH LAB;  Service: Cardiovascular;   Laterality: N/A;  . LITHOTRIPSY  X1  . MITRAL VALVE REPLACEMENT    . REVISON OF ARTERIOVENOUS FISTULA Left 60/45/4098   Procedure: PLICATION OF LEFT ARM ARTERIOVENOUS FISTULA;  Surgeon: Angelia Mould, MD;  Location: [place];  Service: Vascular;  Laterality: Left;  . REVISON OF ARTERIOVENOUS FISTULA Left 10/26/1476   Procedure: PLICATE ANEURYSM  OF LEFT ARTERIOVENOUS FISTULA;  Surgeon: Waynetta Sandy, MD;  Location: [place];  Service: Vascular;  Laterality: Left;     Home Medications:  Prior to Admission medications   Medication Sig Start Date End Date Taking? Authorizing Provider  albuterol (PROVENTIL HFA;VENTOLIN HFA) 108 (90 Base) MCG/ACT inhaler Inhale 2 puffs into the lungs every 6 (six) hours as needed for wheezing or shortness of breath. 06/07/18  Yes Neva Seat, MD  amLODipine (NORVASC) 10 MG tablet Take 1 tablet (10 mg total) by mouth at bedtime. 04/17/18  Yes Neva Seat, MD  aspirin EC 81 MG tablet Take 81 mg by mouth daily.   Yes [provider]  atorvastatin (LIPITOR) 20 MG tablet Take 20 mg by mouth at bedtime.    Yes [provider]  cinacalcet (SENSIPAR) 90 MG tablet Take 180 mg by mouth every Monday, Wednesday, and Friday with hemodialysis.    Yes [provider]  isosorbide mononitrate (IMDUR) 30 MG 24 hr tablet Take 1 tablet (30 mg total) by mouth daily. 07/10/18 07/10/19 Yes Neva Seat, MD  lisinopril (PRINIVIL,ZESTRIL) 10 MG tablet Take 1 tablet (10 mg total) by mouth daily. 07/10/18  Yes Neva Seat, MD  multivitamin (RENA-VIT) TABS tablet TAKE 1 TABLET BY MOUTH ONCE DAILY AT BEDTIME Patient taking differently: Take 1 tablet by mouth daily.  07/02/18  Yes Neva Seat, MD  nitroGLYCERIN (NITROSTAT) 0.4 MG SL tablet Place 1 tablet (0.4 mg total) under the tongue every 5 (five) minutes as needed for chest pain. 07/10/18  Yes Neva Seat, MD  ondansetron (ZOFRAN) 4 MG tablet Take 1 tablet (4 mg total) by  mouth 4 (four) times daily as needed for nausea or vomiting. 03/04/18  Yes Wynona Luna, MD  sevelamer carbonate (RENVELA) 800 MG tablet Take 4 tablets (3,200 mg total) by mouth 3 (three) times daily with meals. Patient taking differently: Take 1,600-3,200 mg by mouth See admin instructions. Take 4 tablets (3200 mg) by mouth three times daily with meals and 2 tablets (1600 mg) with snacks 03/12/16  Yes Vann, Jessica U, DO  cadexomer iodine (IODOSORB) 0.9 % gel Apply 1 application topically daily as needed for wound care. Patient not taking: Reported on 05/27/2018 05/06/18   Landis Martins, DPM  rOPINIRole (REQUIP) 0.25 MG tablet Take 1 tablet (0.25 mg total) by mouth at bedtime. Patient not taking: Reported on 07/18/2018 02/25/18   Bartholomew Crews, MD  traMADol (ULTRAM) 50 MG tablet Take 1 tablet (50 mg total) by mouth every 6 (six) hours as needed. Patient not taking: Reported on 07/18/2018 05/20/18   Landis Martins, DPM    Inpatient Medications: Scheduled Meds: .  amLODipine  10 mg Oral QHS  . aspirin EC  81 mg Oral Daily  . atorvastatin  20 mg Oral QHS  . Chlorhexidine Gluconate Cloth  6 each Topical Q0600  . cinacalcet  180 mg Oral Q M,W,F-HD  . heparin  5,000 Units Subcutaneous Q8H  . isosorbide mononitrate  30 mg Oral Daily  . lisinopril  10 mg Oral Daily  . multivitamin  1 tablet Oral QHS  . sevelamer carbonate  3,200 mg Oral TID WC   Continuous Infusions:  PRN Meds: acetaminophen **OR** acetaminophen, albuterol, polyethylene glycol  Allergies:    Allergies  Allergen Reactions  . Enalapril Hives and Rash  . Latex Rash  . Tape Rash and Other (See Comments)    TAPE MAKES SKIN BREAK OUT AND TURN RED    Social History:   Social History   Socioeconomic History  . Marital status: Married    Spouse name: Not on file  . Number of children: Not on file  . Years of education: Not on file  . Highest education level: Not on file  Occupational History  . Not on file    Social Needs  . Financial resource strain: Not on file  . Food insecurity:    Worry: Not on file    Inability: Not on file  . Transportation needs:    Medical: Not on file    Non-medical: Not on file  Tobacco Use  . Smoking status: Former Smoker    Packs/day: 0.30    Years: 8.00    Pack years: 2.40    Types: Cigars  . Smokeless tobacco: Never Used  . Tobacco comment: black and mild every 3-4 days  Substance and Sexual Activity  . Alcohol use: No    Alcohol/week: 0.0 standard drinks  . Drug use: No  . Sexual activity: Not Currently  Lifestyle  . Physical activity:    Days per week: Not on file    Minutes per session: Not on file  . Stress: Not on file  Relationships  . Social connections:    Talks on phone: Not on file    Gets together: Not on file    Attends religious service: Not on file    Active member of club or organization: Not on file    Attends meetings of clubs or organizations: Not on file    Relationship status: Not on file  . Intimate partner violence:    Fear of current or ex partner: Not on file    Emotionally abused: Not on file    Physically abused: Not on file    Forced sexual activity: Not on file  Other Topics Concern  . Not on file  Social History Narrative   Moved from [place], [place] to [place] in 11/2014.      Lives with fiancee      Quit smoking 02/2017    Family History:    Family History  Problem Relation Age of Onset  . Bone cancer Mother   . Anuerysm Father   . Hypertension Unknown   . Diabetes type II Daughter   . Ovarian cancer Sister      ROS:  Please see the history of present illness.   All other ROS reviewed and negative.     Physical Exam/Data:   Vitals:   07/21/18 0045 07/21/18 0130 07/21/18 0302 07/21/18 0824  BP: (!) 141/80 137/70 (!) 163/90 (!) 158/86  Pulse: 92 89 90 89  Resp: 14 (!) 22 20 18  Temp:   98.4 F (36.9 C) 97.7 F (36.5 C)  TempSrc:    Oral  SpO2: 96% 98% 98% 96%  Weight:      Height:         Intake/Output Summary (Last 24 hours) at 07/21/2018 1353 Last data filed at 07/21/2018 1346 Gross per 24 hour  Intake 570 ml  Output -  Net 570 ml   Filed Weights   07/21/18 0025  Weight: 93.9 kg   Body mass index is 28.07 kg/m.  General:  Well nourished, well developed, in no acute distress HEENT: normal Lymph: no adenopathy Endocrine:  No thryomegaly Vascular: No carotid bruits; pulses 2+ bilaterally; RUE fistula with palpable thrill Cardiac:  normal S1, S2; RRR; no murmur Lungs:  Mild Bibasilar rales  Abd: soft, nontender, no hepatomegaly  Ext: no edema Musculoskeletal:  No deformities, No edema bilaterally Skin: warm and dry  Psych:  Normal affect   EKG:  The EKG was personally reviewed and demonstrates: Sinus Rhythm, TWI aVL, No significant change from previous  Telemetry:  Telemetry was personally reviewed and demonstrates:  Normal Sinus Rhythm  Relevant CV Studies: Echo 06/10/2018: Study Conclusions - Left ventricle: The cavity size was normal. Systolic function was   at the lower limits of normal. The estimated ejection fraction   was in the range of 50% to 55%. Wall motion was normal; there   were no regional wall motion abnormalities. - Ventricular septum: Septal motion showed paradox. These changes   are consistent with a post-thoracotomy state. - Mitral valve: A bioprosthesis was present. The findings are   consistent with moderate stenosis. Gradients at 90 bpm. Mean   gradient (D): 13 mm Hg. Valve area by pressure half-time: 2.1   cm^2. - Right ventricle: The cavity size was moderately dilated. Wall   thickness was normal. Systolic function was moderately reduced. - Pulmonary arteries: Systolic pressure was moderately increased.   PA peak pressure: 51 mm Hg (S).  Recommendations: 1. Consider transesophageal echocardiography if clinically    indicated in order to evaluate mitral prosthesis function. 2. The mitral prosthesis is a new finding (CABG  and MVR at [facility] March 2019).  Laboratory Data:  Chemistry Recent Labs  Lab 07/18/18 1601 07/19/18 2135 07/21/18 0025  NA 135 134* 136  K 3.7 3.9 4.6  CL 92* 90* 91*  CO2 31 29 29   GLUCOSE 113* 109* 96  BUN 14 29* 43*  CREATININE 4.67* 8.05* 10.17*  CALCIUM 8.1* 9.5 8.8*  GFRNONAA 12* 6* 5*  GFRAA 14* 7* 6*  ANIONGAP 12 15 16*    Recent Labs  Lab 07/19/18 2135  PROT 8.2*  ALBUMIN 3.7  AST 18  ALT 11  ALKPHOS 55  BILITOT 0.8   Hematology Recent Labs  Lab 07/18/18 1601 07/19/18 2135 07/21/18 0025  WBC 7.4 8.0 7.0  RBC 4.00* 4.12* 3.83*  HGB 10.1* 10.4* 9.5*  HCT 32.6* 33.7* 30.9*  MCV 81.5 81.8 80.7  MCH 25.3* 25.2* 24.8*  MCHC 31.0 30.9 30.7  RDW 18.4* 18.6* 18.7*  PLT 202 194 189   Cardiac Enzymes Recent Labs  Lab 07/21/18 0155 07/21/18 0718  TROPONINI 0.08* 0.09*    Recent Labs  Lab 07/18/18 1603 07/21/18 0034  TROPIPOC 0.06 0.09*    BNPNo results for input(s): BNP, PROBNP in the last 168 hours.  DDimer No results for input(s): DDIMER in the last 168 hours.  Radiology/Studies:  Dg Chest 2 View  Result Date: 07/21/2018  CLINICAL DATA:  Intermittent shortness of breath since last night. EXAM: CHEST - 2 VIEW COMPARISON:  07/18/2018 FINDINGS: Postoperative changes in the mediastinum. Cardiac enlargement. Pulmonary vascular congestion. Perihilar infiltration likely edema. Small left pleural effusion with basilar atelectasis or consolidation. Appearances are similar to previous study. No pneumothorax. Mediastinal contours appear intact. IMPRESSION: Cardiac enlargement with pulmonary vascular congestion and perihilar edema. Small left pleural effusion with basilar atelectasis or consolidation. Electronically Signed   By: Lucienne Capers M.D.   On: 07/21/2018 00:58   Dg Chest 2 View  Result Date: 07/18/2018 CLINICAL DATA:  Shortness of breath EXAM: CHEST - 2 VIEW COMPARISON:  July 09, 2018 FINDINGS: There is patchy atelectatic change  in the left lower lobe with loculated left pleural effusion. Lungs elsewhere clear. Heart is mildly enlarged with pulmonary venous hypertension. No adenopathy. There is aortic atherosclerosis. Patient is status post coronary artery bypass grafting and mitral valve replacement. No bone lesions. IMPRESSION: Loculated left pleural effusion with patchy atelectasis in the left lower lung region. There is pulmonary vascular congestion. There is aortic atherosclerosis. Status post coronary artery bypass grafting and mitral valve replacement. Aortic Atherosclerosis (ICD10-I70.0). Electronically Signed   By: Lowella Grip III M.D.   On: 07/18/2018 16:11    Assessment and Plan:   1) Dyspnea, Mitral regurgitation: Patient present with dyspnea following HD, improving now. Recent Echo showed findings consistent with Stenosis of Bioprosthetic Mitral valve, which is usual as valve was replaced just 7 months ago. Echo also showed elevated PA pressures and patient has mild rales on exam which could be consistent with mitral stenosis. CXR shows Pulmonary edema and chronic L pleural efussion (increased from 10/2 post Thoracentesis CXR). Unclear why dyspnea would be worsening with HD at this time. Will need further evaluation of MV with TEE. - TEE in am - NPO at [facility]  2) CAD: S/P BMS in 2011, DES in 2012, and CABG x2 in 12/2017. No chest pain today and shortness of breath is not his typical anginal symptom. Mild Troponinemia at 0.08 > 0.09; likely due to demand (also has baseline of ~0.03 given ESRD); Final Troponin pending. Continue medical therapy. - Continue with home ASA, IMDUR, Lisinopril, Atorvastatin  3) HTN: Continue home Lisinopril, Amlodipine, and IMDUR  4) CHFrEF: EF now lower limit of normal on recent ECHO (50-55%).  5) PVD: Patient has a history of PVD with recent evaluation for ischemia due to non-healing ulcer of R toe. Angiogram 05/2018 showing occluded anterior tibial with a widely patent peroneal and  posterior tibial that vascularized to the dorsal pedal arch. Toe wound is now slowly healing.   6) ESRD: On HD MWF, Nephrology Follow. Patient to go for HD this afternoon     For questions or updates, please contact [place] Please consult [email or web address] for contact info under    Signed, Neva Seat, MD  07/21/2018 1:53 PM  Agree with assessment and plan by Dr. Trilby Drummer.  We are asked to see [name] [name] because of shortness of breath.  He does have a history of CAD status post stenting in the past with CABG times two 3/19 at [facility].  He also had a bioprosthetic mitral valve replacement at the same time.  Other problems include hypertension, end-stage renal disease on hemodialysis and peripheral vascular disease with a poorly healing right great toe ulcer and angiography which I performed 8/19 that showed an occluded anterior tibial.  Recent echo revealed normal LV systolic function with what is read as moderate  prosthetic mitral stenosis.  This certainly could be contributory.  I agree with ordering a transesophageal echo to further evaluate the mitral valve.  Lorretta Harp, M.D., [place], [facility], [place] [address] [place], [place]  [address]  [phone] 07/21/2018 2:57 PM

## 2018-07-21 NOTE — Progress Notes (Signed)
Paged that patient was vomiting and having back and chest pain. On evaluation, patient said both back and chest pain are chronic in nature and tramadol usually helps his back pain. He reported vomiting 4x but denied any abdominal pain. He said he has had similar episodes in the past but does not know what causes them. He was made aware he is due for HD and that may help his symptoms.    Physical Exam  Constitutional: He is well-developed, well-nourished, and in no distress.  Cardiovascular: Regular rhythm and normal heart sounds.  No murmur heard. Sinus tachycardia  Abdominal: Soft. Bowel sounds are normal. He exhibits no distension. There is no tenderness.   Ordered IV phenergan   Signed: Bryon Parker 07/21/2018

## 2018-07-22 ENCOUNTER — Encounter (HOSPITAL_COMMUNITY): Payer: Self-pay

## 2018-07-22 ENCOUNTER — Ambulatory Visit: Payer: Medicare Other | Admitting: Sports Medicine

## 2018-07-22 ENCOUNTER — Encounter (HOSPITAL_COMMUNITY)
Admission: EM | Disposition: A | Discharge status: Home / Self Care | Payer: Self-pay | Source: Home / Self Care | Attending: Internal Medicine

## 2018-07-22 DIAGNOSIS — S29012A Strain of muscle and tendon of back wall of thorax, initial encounter: Secondary | ICD-10-CM

## 2018-07-22 HISTORY — DX: Strain of muscle and tendon of back wall of thorax, initial encounter: S29.012A

## 2018-07-22 LAB — CBC
HCT: 32.3 % — ABNORMAL LOW (ref 39.0–52.0)
HEMOGLOBIN: 9.9 g/dL — AB (ref 13.0–17.0)
MCH: 24.3 pg — ABNORMAL LOW (ref 26.0–34.0)
MCHC: 30.7 g/dL (ref 30.0–36.0)
MCV: 79.4 fL — ABNORMAL LOW (ref 80.0–100.0)
NRBC: 0 % (ref 0.0–0.2)
Platelets: 211 10*3/uL (ref 150–400)
RBC: 4.07 MIL/uL — ABNORMAL LOW (ref 4.22–5.81)
RDW: 18.5 % — ABNORMAL HIGH (ref 11.5–15.5)
WBC: 6.6 10*3/uL (ref 4.0–10.5)

## 2018-07-22 LAB — GLUCOSE, CAPILLARY
GLUCOSE-CAPILLARY: 69 mg/dL — AB (ref 70–99)
GLUCOSE-CAPILLARY: 85 mg/dL (ref 70–99)
Glucose-Capillary: 77 mg/dL (ref 70–99)
Glucose-Capillary: 69 mg/dL — ABNORMAL LOW (ref 70–99)
Glucose-Capillary: 92 mg/dL (ref 70–99)
Glucose-Capillary: 83 mg/dL (ref 70–99)
Glucose-Capillary: 100 mg/dL — ABNORMAL HIGH (ref 70–99)

## 2018-07-22 LAB — BASIC METABOLIC PANEL
ANION GAP: 13 (ref 5–15)
BUN: 19 mg/dL (ref 6–20)
CALCIUM: 8.5 mg/dL — AB (ref 8.9–10.3)
CHLORIDE: 94 mmol/L — AB (ref 98–111)
CO2: 30 mmol/L (ref 22–32)
CREATININE: 6.65 mg/dL — AB (ref 0.61–1.24)
GFR, EST AFRICAN AMERICAN: 9 mL/min — AB (ref >60)
GFR, EST NON AFRICAN AMERICAN: 8 mL/min — AB (ref >60)
Glucose, Bld: 82 mg/dL (ref 70–99)
Potassium: 3.9 mmol/L (ref 3.5–5.1)
Sodium: 137 mmol/L (ref 135–145)

## 2018-07-22 SURGERY — ECHOCARDIOGRAM, TRANSESOPHAGEAL
Anesthesia: Moderate Sedation

## 2018-07-22 MED ORDER — CHLORHEXIDINE GLUCONATE CLOTH 2 % EX PADS: 6.0000 | MEDICATED_PAD | Freq: Every day | CUTANEOUS | Status: DC

## 2018-07-22 MED ORDER — TRAMADOL HCL 50 MG PO TABS
50.0000 mg | ORAL_TABLET | Freq: Two times a day (BID) | ORAL | Status: DC | PRN
  Administered 2018-07-22 – 2018-07-23 (×2): 50 mg via ORAL
  Filled 2018-07-22 (×2): qty 1

## 2018-07-22 MED ORDER — LIDOCAINE 5 % EX PTCH
1.0000 | MEDICATED_PATCH | Freq: Every day | CUTANEOUS | Status: DC | PRN
  Administered 2018-07-22: 1 via TRANSDERMAL
  Filled 2018-07-22: qty 1

## 2018-07-22 MED ORDER — RAMELTEON 8 MG PO TABS
8.0000 mg | ORAL_TABLET | Freq: Once | ORAL | Status: AC
  Administered 2018-07-22: 8 mg via ORAL
  Filled 2018-07-22: qty 1

## 2018-07-22 NOTE — Progress Notes (Addendum)
[facility] Progress Note   Subjective:  Patient was returning to his room after ambulating the halls when seen today.  Reports neck/back pain but otherwise feels ok.  SOB improved.  Denies CP.  Requesting to have HD today due to procedure tomorrow.  States he is getting tired of dialysis as an OP.  Objective Vitals:   07/22/18 0038 07/22/18 0123 07/22/18 0355 07/22/18 1015  BP: (!) 152/82 (!) 179/82 (!) 158/79 (!) 151/85  Pulse: 89 92 97 96  Resp: 17 16 18 18   Temp: 98 F (36.7 C)  98 F (36.7 C) 98.3 F (36.8 C)  TempSrc: Oral  Oral Oral  SpO2: 98% 96% 95% 97%  Weight: 87.5 kg     Height:       Physical Exam General:NAD, chronically ill appearing male.  Heart:RRR, +9/7 systolic murmur Lungs: mostly CTAB, +bibasilar crackles Abdomen:soft, NTND Extremities:no edema Dialysis Access: AVF +b/t   Filed Weights   07/21/18 0025 07/21/18 2103 07/22/18 0038  Weight: 93.9 kg 89.5 kg 87.5 kg    Intake/Output Summary (Last 24 hours) at 07/22/2018 1339 Last data filed at 07/22/2018 0930 Gross per 24 hour  Intake 780 ml  Output 1940 ml  Net -1160 ml    Additional Objective Labs: Basic Metabolic Panel: Recent Labs  Lab 07/19/18 2135 07/21/18 0025 07/22/18 0443  NA 134* 136 137  K 3.9 4.6 3.9  CL 90* 91* 94*  CO2 29 29 30   GLUCOSE 109* 96 82  BUN 29* 43* 19  CREATININE 8.05* 10.17* 6.65*  CALCIUM 9.5 8.8* 8.5*   Liver Function Tests: Recent Labs  Lab 07/19/18 2135  AST 18  ALT 11  ALKPHOS 55  BILITOT 0.8  PROT 8.2*  ALBUMIN 3.7   CBC: Recent Labs  Lab 07/18/18 1601 07/19/18 2135 07/21/18 0025 07/22/18 0443  WBC 7.4 8.0 7.0 6.6  NEUTROABS  --   --  4.4  --   HGB 10.1* 10.4* 9.5* 9.9*  HCT 32.6* 33.7* 30.9* 32.3*  MCV 81.5 81.8 80.7 79.4*  PLT 202 194 189 211   Cardiac Enzymes: Recent Labs  Lab 07/21/18 0155 07/21/18 0718 07/21/18 1416  TROPONINI 0.08* 0.09* 0.07*   CBG: Recent Labs  Lab 07/22/18 0118 07/22/18 0200  07/22/18 0738 07/22/18 0834 07/22/18 1204  GLUCAP 69* 77 69* 92 85   Studies/Results: Dg Chest 2 View  Result Date: 07/21/2018 CLINICAL DATA:  Intermittent shortness of breath since last night. EXAM: CHEST - 2 VIEW COMPARISON:  07/18/2018 FINDINGS: Postoperative changes in the mediastinum. Cardiac enlargement. Pulmonary vascular congestion. Perihilar infiltration likely edema. Small left pleural effusion with basilar atelectasis or consolidation. Appearances are similar to previous study. No pneumothorax. Mediastinal contours appear intact. IMPRESSION: Cardiac enlargement with pulmonary vascular congestion and perihilar edema. Small left pleural effusion with basilar atelectasis or consolidation. Electronically Signed   By: Lucienne Capers M.D.   On: 07/21/2018 00:58    Medications:  . amLODipine  10 mg Oral QHS  . aspirin EC  81 mg Oral Daily  . atorvastatin  20 mg Oral QHS  . Chlorhexidine Gluconate Cloth  6 each Topical Q0600  . cinacalcet  180 mg Oral Q M,W,F-HD  . heparin  5,000 Units Subcutaneous Q8H  . isosorbide mononitrate  30 mg Oral Daily  . lisinopril  10 mg Oral Daily  . multivitamin  1 tablet Oral QHS  . sevelamer carbonate  3,200 mg Oral TID WC    Dialysis Orders: MWF GOC  4h  500/AF 2.0   91kg  2/2 bath  P2  AVF  Hep 8000  - sensipar 180  - mircera 75 ug last 10/9  - vit D 0.5 ug q hd  Home meds:  - amlodipine 10/ lisinopril 10 qd  - aspirin 81/ atorvastatin 20 qd/ isosorbide mononitrate 30 qd/ sl ntg prn  - ropinirole 0.25 mg hs/ tramadol 50 qid prn  - sevelamer carbonate 4 tabs ac tid/ cinacalcet 180 tiw  - vitamins/ prns/ hfa  Assessment/Plan: 1. SOB 2/2 volume overload - CXR shows pulmonary edema, per patient losing weight, post weight after HD yesterday 87.5kg.  Will need new EDW at d/c.  2. Mitral regurgitation s/p valve replacement - concern for stenosis of bioprosthetic mitral valve indicated on recent ECHO, TEE scheduled for tomorrow. Per  cardio. 3. ESRD - on HD MWF. K 4.6. Requesting HD today due to procedure tomorrow.  At the moment unable to facilitate tonight, informed HD staff if spot becomes available to add him on otherwise HD tomorrow after TEE. 4. Anemia of CKD - Hgb 9.9. mircera recently given. Follow trends. 5. Metabolic bone disease - Ca 8.5, check phos - continue sensipar, VDRA and binders. 6. HTN- BP elevated, continue home meds.  7. Nutrition - Renal diet w/fluid restrictions.  8.  CAD s/p stenting and CABG 9. GERD 10. DMT2  Jen Mow, PA-C [facility] Pager: [phone] 07/22/2018,1:39 PM  LOS: 1 day   Pt seen, examined and agree w A/P as above.  Kelly Splinter MD [employer] pager [phone]   07/22/2018, 2:09 PM

## 2018-07-22 NOTE — Progress Notes (Addendum)
Paged about patient insisting on eating despite NPO order. He states that after his hemodialysis he is no longer endorsing dyspnea, nausea or vomiting and would like to eat. Explained to the pt about the importance of being NPO for TEE to evaluate his mitral valve prosthesis. Patient expressed understanding but he states he is not going to get the TEE during this admission. He states he will follow up with Dr.Berry as outpatient and get TEE at a later time. Explained to the patient that if he eats now he will be unable to get the TEE even if he changes his mind later. He expresses understanding but continues to emphasize that he wants to eat and is planning to refuse the TEE whether he is kept NPO or not. Of note, he was given dinner prior to dialysis which he refused due to loss of appetite and nausea.   Gen: Well-developed, well nourished, NAD Neck: supple, ROM intact, no JVD, no cervical adenopathy CV: RRR, S1, S2 normal, No rubs, no murmurs, no gallops Pulm: Trace rales on Left lower base. Much improved than yesterday Abd: Soft, BS+, NTND, No rebound, no guarding Extm: ROM intact, Peripheral pulses intact, No peripheral edema Skin: Dry, Warm, normal turgor Neuro: AAOx3, Cranial Nerve II-XII intact, DTR intact Psych: Irritated  Considering that he is now currently satting 96 on room air and his respiratory exam appears improved after HD, I do not feel TEE needs to be performed urgently. Patient is not altered and has capacity for medical decision making. He has the right to refuse procedures. Will allow him to eat for now and inform cardiology of patient's desire for TEE as outpatient in AM.

## 2018-07-22 NOTE — Progress Notes (Signed)
Paged internal med pager to notify MD of unrelieved pain of pt- he c/o of back pain and that the lidocaine patch is not helping and refuses to take Tylenol. Will continue to monitor.   Paulla Fore, RN

## 2018-07-22 NOTE — Progress Notes (Addendum)
Patient came back for HD and I informed patient he is not supposed to eat or drink anything due to the fact that he has a scheduled TEE at 10:00am. Patient stated " no matter what you say I am going to eat, I have not had anything to drink or eat for two days". Dr Truman Hayward made aware. Per MD he will come and talk to patient. Upon arrival of Dr Truman Hayward  patient stated to MD " I do not care what you say I am just going eat" .

## 2018-07-22 NOTE — Progress Notes (Addendum)
Progress Note  Patient Name: [name] [name] Date of Encounter: 07/22/2018  Primary Cardiologist: Dr. Gwenlyn Found  Subjective   Patient with improvement in SOB today. He ate overnight and told his night team that he did not want TEE. This morning he was encouraged to hold off on breakfast so we could try to reschedule him, but he ate something anyway. The importance of having the TEE done here and that outpatient was not a reasonable option for him at this time was explained. After our discussion patient is agreeable to TEE, to be performed tomorrow.  Inpatient Medications    Scheduled Meds: . amLODipine  10 mg Oral QHS  . aspirin EC  81 mg Oral Daily  . atorvastatin  20 mg Oral QHS  . Chlorhexidine Gluconate Cloth  6 each Topical Q0600  . cinacalcet  180 mg Oral Q M,W,F-HD  . heparin  5,000 Units Subcutaneous Q8H  . isosorbide mononitrate  30 mg Oral Daily  . lisinopril  10 mg Oral Daily  . multivitamin  1 tablet Oral QHS  . sevelamer carbonate  3,200 mg Oral TID WC   Continuous Infusions:  PRN Meds: acetaminophen **OR** acetaminophen, albuterol, lidocaine, polyethylene glycol, promethazine   Vital Signs    Vitals:   07/22/18 0030 07/22/18 0038 07/22/18 0123 07/22/18 0355  BP: (!) 158/87 (!) 152/82 (!) 179/82 (!) 158/79  Pulse: 89 89 92 97  Resp:  17 16 18   Temp:  98 F (36.7 C)  98 F (36.7 C)  TempSrc:  Oral  Oral  SpO2:  98% 96% 95%  Weight:  87.5 kg    Height:        Intake/Output Summary (Last 24 hours) at 07/22/2018 0956 Last data filed at 07/22/2018 0100 Gross per 24 hour  Intake 780 ml  Output 1940 ml  Net -1160 ml   Filed Weights   07/21/18 0025 07/21/18 2103 07/22/18 0038  Weight: 93.9 kg 89.5 kg 87.5 kg    Telemetry    Sinus Rhythm, Brief mild Sinus Tach - Personally Reviewed  ECG    None Today  Physical Exam   GEN: No acute distress.   Neck: No JVD Cardiac: RRR, no murmurs, rubs, or gallops.  Respiratory: Trace basilar rales. GI: Soft,  nontender, non-distended  MS: No edema; No deformity. Neuro:  Nonfocal  Psych: Normal affect   Labs    Chemistry Recent Labs  Lab 07/19/18 2135 07/21/18 0025 07/22/18 0443  NA 134* 136 137  K 3.9 4.6 3.9  CL 90* 91* 94*  CO2 29 29 30   GLUCOSE 109* 96 82  BUN 29* 43* 19  CREATININE 8.05* 10.17* 6.65*  CALCIUM 9.5 8.8* 8.5*  PROT 8.2*  --   --   ALBUMIN 3.7  --   --   AST 18  --   --   ALT 11  --   --   ALKPHOS 55  --   --   BILITOT 0.8  --   --   GFRNONAA 6* 5* 8*  GFRAA 7* 6* 9*  ANIONGAP 15 16* 13     Hematology Recent Labs  Lab 07/19/18 2135 07/21/18 0025 07/22/18 0443  WBC 8.0 7.0 6.6  RBC 4.12* 3.83* 4.07*  HGB 10.4* 9.5* 9.9*  HCT 33.7* 30.9* 32.3*  MCV 81.8 80.7 79.4*  MCH 25.2* 24.8* 24.3*  MCHC 30.9 30.7 30.7  RDW 18.6* 18.7* 18.5*  PLT 194 189 211    Cardiac Enzymes Recent Labs  Lab 07/21/18  0155 07/21/18 0718 07/21/18 1416  TROPONINI 0.08* 0.09* 0.07*    Recent Labs  Lab 07/18/18 1603 07/21/18 0034  TROPIPOC 0.06 0.09*     BNPNo results for input(s): BNP, PROBNP in the last 168 hours.   DDimer No results for input(s): DDIMER in the last 168 hours.   Radiology    Dg Chest 2 View  Result Date: 07/21/2018 CLINICAL DATA:  Intermittent shortness of breath since last night. EXAM: CHEST - 2 VIEW COMPARISON:  07/18/2018 FINDINGS: Postoperative changes in the mediastinum. Cardiac enlargement. Pulmonary vascular congestion. Perihilar infiltration likely edema. Small left pleural effusion with basilar atelectasis or consolidation. Appearances are similar to previous study. No pneumothorax. Mediastinal contours appear intact. IMPRESSION: Cardiac enlargement with pulmonary vascular congestion and perihilar edema. Small left pleural effusion with basilar atelectasis or consolidation. Electronically Signed   By: Lucienne Capers M.D.   On: 07/21/2018 00:58    Cardiac Studies   Echo 06/10/2018: Study Conclusions - Left ventricle: The cavity size  was normal. Systolic function was at the lower limits of normal. The estimated ejection fraction was in the range of 50% to 55%. Wall motion was normal; there were no regional wall motion abnormalities. - Ventricular septum: Septal motion showed paradox. These changes are consistent with a post-thoracotomy state. - Mitral valve: A bioprosthesis was present. The findings are consistent with moderate stenosis. Gradients at 90 bpm. Mean gradient (D): 13 mm Hg. Valve area by pressure half-time: 2.1 cm^2. - Right ventricle: The cavity size was moderately dilated. Wall thickness was normal. Systolic function was moderately reduced. - Pulmonary arteries: Systolic pressure was moderately increased. PA peak pressure: 51 mm Hg (S).  Recommendations: 1. Consider transesophageal echocardiography if clinically indicated in order to evaluate mitral prosthesis function. 2. The mitral prosthesis is a new finding (CABG and MVR at [facility] March 2019).  TEE tomorrow   Patient Profile     62 y.o. male with a hx of CAD (s/p stents 2011,2012; s/p CABG x2 12/2017); ESRD (on HD MWF); PVD; CHF (EF 50-55%) who is being evaluated for Dyspnea and Stenosis of prosthetic valve on recent echo. Plan is for TEE for tomorrow to evaluate bioprosthetic mitral valve.  Assessment & Plan    1) Dyspnea, Mitral regurgitation: Patient present with dyspnea following HD, improving now. Recent Echo showed findings consistent with Stenosis of Bioprosthetic Mitral valve, which is abnormal for valve replaced just 7 months ago. Echo also showed elevated PA pressures and patient has mild rales on exam which could be consistent with mitral stenosis. CXR shows Pulmonary edema and chronic L pleural efussion (increased from 10/2 post Thoracentesis CXR). Unclear why dyspnea would be worsening with HD at this time. Will need further evaluation of MV with TEE; this was delayed 2/2 patient nonadherance to NPO  orders and wanting to defer procedure overnight. After discussion today, he is agreeable to TEE tomorrow. - TEE in am; NPO at MN  2) CAD: S/P BMS in 2011, DES in 2012, and CABG x2 in 12/2017. No chest pain today and shortness of breath is not his typical anginal symptom. Mild Troponinemia at 0.08 > 0.09 > 0.07; likely due to demand (also has baseline of ~0.03 given ESRD). Continue medical therapy. - Continue with home ASA, IMDUR, Lisinopril, Atorvastatin  3) HTN: Continue home Lisinopril, Amlodipine, and IMDUR  4) CHFrEF: EF now lower limit of normal on recent ECHO (50-55%).  5) PVD: Patient has a history of PVD with recent evaluation for ischemia due to  non-healing ulcer of R toe. Angiogram 05/2018 showing occluded anterior tibial with a widely patent peroneal and posterior tibial that vascularized to the dorsal pedal arch. Toe wound is now slowly healing.   6) ESRD: On HD MWF, Nephrology Following.      For questions or updates, please contact [place] Please consult [email or web address] for contact info under        Signed, [name] Seat, MD  07/22/2018, 9:56 AM    Agree with note by Dr. Neva [name]  Patient was scheduled for TEE today to further evaluate his mitral bioprosthetic stenosis however he ate breakfast this morning AGAINST MEDICAL ADVICE and therefore his TEE would need to be rescheduled for tomorrow.  Lorretta Harp, M.D., [place], [facility], Laverta [place] Mercer [address]. [place], [place]  [address]  [phone] 07/22/2018 1:30 PM

## 2018-07-22 NOTE — Progress Notes (Signed)
Hypoglycemia event:  CBG 69  Pt is NPO for TEE he is refusing- MD has been made aware of this earlier.  Notified MD of CBG 69 and if pt can have diet since he is refusing TEE.

## 2018-07-22 NOTE — Progress Notes (Signed)
Subjective: Overnight, [name] [name] had 4 episodes of vomiting, along with chest and back pain. He reported to the overnight team that his pain is chronic and he takes tramadol for back pain. His N/V was treated with phenergan. Overnight, he went to HD with I.9L out. He then refused to be NPO and decided to eat. He wants to get the TEE outpatient. He also had a hypoglycemic episode at 0144 with BG 69, and after eating, his BG increased to 77. This morning, [name] [name] continues to complain of upper right back pain, not relieved with lidocaine patch. He reports feeling that his breathing is improved. He denies clamminess or sweating.   Objective: Vital signs in last 24 hours: Vitals:   07/22/18 0038 07/22/18 0123 07/22/18 0355 07/22/18 1015  BP: (!) 152/82 (!) 179/82 (!) 158/79 (!) 151/85  Pulse: 89 92 97 96  Resp: 17 16 18 18   Temp: 98 F (36.7 C)  98 F (36.7 C) 98.3 F (36.8 C)  TempSrc: Oral  Oral Oral  SpO2: 98% 96% 95% 97%  Weight: 87.5 kg     Height:       Weight change: -4.395 kg  Intake/Output Summary (Last 24 hours) at 07/22/2018 1102 Last data filed at 07/22/2018 0100 Gross per 24 hour  Intake 540 ml  Output 1940 ml  Net -1400 ml   General appearance: alert, cooperative, appears stated age and no distress Lungs: clear to auscultation bilaterally Heart: regular rate and rhythm and S1, S2 normal, systolic heart sound louder  Lab Results: Cr 6.65 Ca 8.5 Glucose 82  Hgb 9.9  Troponin 0.07 (from 0.09)  Micro Results: MRSA negative  Studies/Results: CXR 10/14 - I have reviewed this image.  IMPRESSION: Cardiac enlargement with pulmonary vascular congestion and perihilar edema. Small left pleural effusion with basilar atelectasis or consolidation.   Medications: I have reviewed the patient's current medications. Scheduled Meds: . amLODipine  10 mg Oral QHS  . aspirin EC  81 mg Oral Daily  . atorvastatin  20 mg Oral QHS  . Chlorhexidine Gluconate Cloth  6 each Topical  Q0600  . cinacalcet  180 mg Oral Q M,W,F-HD  . heparin  5,000 Units Subcutaneous Q8H  . isosorbide mononitrate  30 mg Oral Daily  . lisinopril  10 mg Oral Daily  . multivitamin  1 tablet Oral QHS  . sevelamer carbonate  3,200 mg Oral TID WC   Continuous Infusions: PRN Meds:.acetaminophen **OR** acetaminophen, albuterol, lidocaine, polyethylene glycol, promethazine Assessment/Plan: Principal Problem:   Shortness of breath Active Problems:   Elevated troponin   ESRD (end stage renal disease) ([place])   Mitral stenosis   Muscle strain of right upper back  [name] [name] is a 62 yo male with a past medical history of CAD s/p stents (2011, 2012) and CABG x2 (March 2019), mitral valve replacement (March 9150), chronic systolic CHF (EF 56-97%), ESRD on hemodialysis, HTN, HLD, and diabetes, who presented on 10/14 with complaints of dyspnea, weakness, and cold sweats.  Shortness of breath: [name] [name] has symptomatically improved with hemodialysis overnight and with nasal cannula. This morning, he is satting at 95% on room air. He is now down to his new baseline weight (87.5 kg). Due to nonadherence to NPO orders, TEE will be performed tomorrow. Make NPO tonight. 1. NPO tonight 2. TEE tomorrow  3. Cardiology following  ESRD on HD MWF: [name] [name] had HD overnight and they removed 1.9L. Nephro will reassess if he needs further HD today.  1. Evaluate  for need for further HD 2. Continue home Sevelamer 3200 TID 3. Continue home Cinacalcet 180 mg MWF w/HD  Hypoglycemia: [name] [name] was treated in the ED on two separate occassions (07/18/18 and 07/20/18) for hypoglycemia.  1. CBG monitoring --> 69, 77, 69, 92 this AM 2. If hypoglycemic, check serum BG, and if elevated, check insulin, C-peptide before giving glucose  MS: [name] [name] had bioprosthetic mitral valve replacement in March 2019. Cardiology following. TEE tomorrow as above.  HTN: [name] [name] BP has remained elevated throughout his hospital course, ranging  150s-179/70-80s since last night.  1. Continue home Amlodipine 10 mg qd 2. Continue home lisinopril 10 mg qd  Muscle strain of R upper back: [name] [name] continues to complain of right shoulder/muscle pain. He reports that lidocaine patch doesn't help, and he doesn't want to try tylenol. He reports that tramadol is the only thing that helps. Per PCP, will give him tramadol. We will recommend outpatient PT. 1. Tramadol 50 mg q12h prn 2. Recommend outpatient PT  Diet: renal with fluid restriction, NPO at midnight Code: full IVF: none VTE ppx: Heparin Dispo: Plan for discharge after TEE  This is a Careers information officer Note.  The care of the patient was discussed with Dr. Myrtie Hawk and the assessment and plan formulated with their assistance.  Please see their attached note for official documentation of the daily encounter.   LOS: 1 day   [name] [name], Medical Student 07/22/2018, 11:17 AM  I have seen and examined the patient myself, and I have reviewed the note by Nena Polio MS 3 and was present during the interview and physical exam.  Please see my separate H&P for additional findings, assessment, and plan.   Signed: Dewayne Hatch, MD 07/22/2018, 6:53 PM

## 2018-07-22 NOTE — Discharge Summary (Addendum)
Name: [name] [name] MRN: [id number] DOB: 24-Sep-1956 62 y.o. PCP: Neva Seat, MD  Date of Admission: 07/21/2018 12:16 AM Date of Discharge:  07/23/18 Attending Physician: Lucious Groves, DO  Discharge Diagnosis: 1. Shortness of breath 2. ESRD (on HD MWF) 3. Mitral valve stenosis  4. Hypertension 5. Muscle strain of right upper back  Discharge Medications: Allergies as of 07/23/2018      Reactions   Enalapril Hives, Rash   Latex Rash   Tape Rash, Other (See Comments)   TAPE MAKES SKIN BREAK OUT AND TURN RED      Medication List    TAKE these medications   albuterol 108 (90 Base) MCG/ACT inhaler Commonly known as:  PROVENTIL HFA;VENTOLIN HFA Inhale 2 puffs into the lungs every 6 (six) hours as needed for wheezing or shortness of breath.   amLODipine 10 MG tablet Commonly known as:  NORVASC Take 1 tablet (10 mg total) by mouth at bedtime.   aspirin EC 81 MG tablet Take 81 mg by mouth daily.   atorvastatin 20 MG tablet Commonly known as:  LIPITOR Take 20 mg by mouth at bedtime.   cadexomer iodine 0.9 % gel Commonly known as:  IODOSORB Apply 1 application topically daily as needed for wound care.   cinacalcet 90 MG tablet Commonly known as:  SENSIPAR Take 180 mg by mouth every Monday, Wednesday, and Friday with hemodialysis.   isosorbide mononitrate 30 MG 24 hr tablet Commonly known as:  IMDUR Take 1 tablet (30 mg total) by mouth daily.   lisinopril 10 MG tablet Commonly known as:  PRINIVIL,ZESTRIL Take 1 tablet (10 mg total) by mouth daily.   multivitamin Tabs tablet TAKE 1 TABLET BY MOUTH ONCE DAILY AT BEDTIME What changed:  when to take this   nitroGLYCERIN 0.4 MG SL tablet Commonly known as:  NITROSTAT Place 1 tablet (0.4 mg total) under the tongue every 5 (five) minutes as needed for chest pain.   ondansetron 4 MG tablet Commonly known as:  ZOFRAN Take 1 tablet (4 mg total) by mouth 4 (four) times daily as needed for nausea or vomiting.     rOPINIRole 0.25 MG tablet Commonly known as:  REQUIP Take 1 tablet (0.25 mg total) by mouth at bedtime.   sevelamer carbonate 800 MG tablet Commonly known as:  RENVELA Take 4 tablets (3,200 mg total) by mouth 3 (three) times daily with meals. What changed:    how much to take  when to take this  additional instructions   traMADol 50 MG tablet Commonly known as:  ULTRAM Take 1 tablet (50 mg total) by mouth every 12 (twelve) hours as needed for up to 7 days for moderate pain. What changed:    when to take this  reasons to take this       Disposition and follow-up:   [name] was [facility] from [facility] in Stable condition.  At the hospital follow up visit please address:  1.  Volume status  2.  Right upper back muscle strain pain  3.  Labs / imaging needed at time of follow-up: BMP. Insulin and C-Peptide level if Serum glucose showed hypoglycemia  4.  Pending labs/ test needing follow-up: none  Follow-up Appointments: Follow-up Information    Neva Seat, MD .   Specialty:  Internal Medicine Contact information: [place] [address] [phone]        Lorretta Harp, MD Follow up.   Specialties:  Cardiology, Radiology Contact  information: [address] [place] [address] [phone]        Concepcion Living, MD Follow up.   Specialty:  Surgery Contact information: [place] [address] [phone]         Wednesday, October 23 at 2:15 pm with Dr. Holland Falling Course by problem list: [name] is a 62 yo male with a past medical history of CAD s/p stents (2011, 2012) and CABG x2 (March 2019), mitral valve replacement (March 5176), chronic systolic CHF (EF 16-07%), ESRD on hemodialysis, HTN, HLD, and diabetes, who presented on 10/14 with complaints of dyspnea, weakness, and cold sweats.  1. Shortness of breath: [name] presented to the  ED on 10/14 with dyspnea since after his full dialysis session on Friday (10/11), which worsened prior to his presentation at the ED. He also had productive cough with white sputum. On exam, he had JVD and bilateral rales. His CXR revealed progression of left pleural effusion and pulmonary edema. He had mild troponemia at 0.09, which downtrended to 0.07. Shortness of breath most likely secondary to either ESRD or mitral valve stenosis. He was started on 2L [place] with improvement. His dyspnea improved after dialysis. Cardiology consult recommended TEE to evaluate mitral valve stenosis as possible contribution to dyspnea. TEE on 10/16 revealed mild central MR with mild restriction of anterior leaflet of prosthetic mitral valve with a mean gradient of 9. He should follow-up with his cardiothoracic surgeon at [facility], Dr. Concepcion Living, and with Dr. Quay Burow, his cardiologist.  2. ESRD (on HD MWF): [name] has dialysis MWF. His post-dialysis Cr 4.67, and his Cr at presentation was 10.17. His CXR revealed fluid overload with pulmonary edema. We continued his home cincalcet and sevelamer. Nephrology was consulted, and he received hemodialysis on 10/15 and 10/16. His new dry weight is 86.3 kg.   3. Mitral valve stenosis: [name] had mitral valve replacement in March 2019. Findings on most recent echo (09/19) revealed moderate stenosis of MV. Reevaluation with TEE (results as above).  4. Hypertension: [name] BP remained elevated throughout his hospital course, ranging 137-179/70-90. We continued his home amlodipine and lisinopril.  5. Muscle strain of right upper back: [name] had been complaining of right upper back pain, that was unrelieved with lidocaine patch. He reports that he has this pain chronically and normally takes tramadol. We started him on tramadol 50 mg q12h prn.  Discharge Vitals:   BP 115/62   Pulse 90   Temp 98.7 F (37.1 C) (Oral)   Resp 17   Ht 6' (1.829 m)   Wt 87.7  kg   SpO2 96%   BMI 26.22 kg/m   Pertinent Labs, Studies, and Procedures:  Cr at presentation: 10.17 Cr at discharge: 9.74  CXR 10/14: Left pleural effusion with basilar atelectasis or consolidation; pulmonary edema EKG 10/14: Sinus rhythm, nonnspecific T abnormalities in lateral leads, no significant change when compared with ECG of 07/06/2018 TEE 10/15: mild central MR with mild restriction of anterior leaflet of prosthetic mitral valve with a mean gradient of 9  Discharge Instructions: [name], it was a pleasure taking care of you during your hospital stay. While you were here, you were treated for your symptom of shortness of breath. After dialysis, your shortness of breath improved. Your new baseline dry weight is ~86.3 kg (190 lbs).   You should come back to the emergency room if you experience worsening shortness of  breath, if you are unable to lay flat without feeling short of breath, or if you gain 2 lbs within one day.  You have a follow-up appointment with your PCP, Dr. Neva Seat, on Wednesday, October 23 at 2:15 pm. If you have any questions, please call [phone]. You should also schedule follow-up appointments with both your cardiologist, Dr. Quay Burow, and your cardiothoracic surgeon at [facility].  SignedDewayne Hatch, MD 07/23/2018, 4:30 PM   Pager: [phone]   Internal Medicine Attending Note:  I saw and examined the patient on the day of discharge. I reviewed and agree with the discharge summary written by the house staff. Lucious Groves, DO 07/23/18 9:17 PM

## 2018-07-22 NOTE — Progress Notes (Signed)
Internal Medicine Attending:   I saw and examined the patient. I reviewed Dr Darcey Nora note and I agree with the resident's findings and plan as documented in the resident's note. His SOB is much improved after additional UF removal at HD, he will now have a lower EDW on discharge.  Unfortuantely he did not remain NPO overnight and thus could not have TEE to evaluate his MV today, this has been rescheduled for tomorrow. He again had a mildly low CBG however plasma glucose this morning was normal, again this is not necesially true hypoglycemia, agree with plan as outlined of checking serum gluose as well as insulin and c peptide levels. He did complain of some acute on chronic back pain, has tenderness of paraspinal musculature of T5-7 on the right, suspect muscle strain.

## 2018-07-22 NOTE — Progress Notes (Signed)
Hypoglycemic Event  CBG: 69  Treatment: patient insisted he was going to eat his dinner Symptoms: none  Follow-up CBG: Time:0200 CBG Result:77  Possible Reasons for Event:patient did not eat dinner due to nausea and vomitting Comments/MD notified:none    [name] [name] A [name]

## 2018-07-23 ENCOUNTER — Encounter (HOSPITAL_COMMUNITY)
Admission: EM | Disposition: A | Discharge status: Home / Self Care | Payer: Self-pay | Source: Home / Self Care | Attending: Internal Medicine

## 2018-07-23 ENCOUNTER — Inpatient Hospital Stay (HOSPITAL_COMMUNITY): Payer: Medicare Other

## 2018-07-23 ENCOUNTER — Encounter (HOSPITAL_COMMUNITY): Payer: Self-pay | Admitting: *Deleted

## 2018-07-23 DIAGNOSIS — I342 Nonrheumatic mitral (valve) stenosis: Secondary | ICD-10-CM

## 2018-07-23 HISTORY — PX: TEE WITHOUT CARDIOVERSION: SHX5443

## 2018-07-23 LAB — BASIC METABOLIC PANEL
Anion gap: 14 (ref 5–15)
BUN: 32 mg/dL — AB (ref 6–20)
CALCIUM: 8.5 mg/dL — AB (ref 8.9–10.3)
CO2: 28 mmol/L (ref 22–32)
Chloride: 95 mmol/L — ABNORMAL LOW (ref 98–111)
Creatinine, Ser: 9.74 mg/dL — ABNORMAL HIGH (ref 0.61–1.24)
GFR calc Af Amer: 6 mL/min — ABNORMAL LOW (ref >60)
GFR, EST NON AFRICAN AMERICAN: 5 mL/min — AB (ref >60)
GLUCOSE: 71 mg/dL (ref 70–99)
Potassium: 4.2 mmol/L (ref 3.5–5.1)
Sodium: 137 mmol/L (ref 135–145)

## 2018-07-23 LAB — CBC
HCT: 32.3 % — ABNORMAL LOW (ref 39.0–52.0)
Hemoglobin: 9.9 g/dL — ABNORMAL LOW (ref 13.0–17.0)
MCH: 24.6 pg — AB (ref 26.0–34.0)
MCHC: 30.7 g/dL (ref 30.0–36.0)
MCV: 80.3 fL (ref 80.0–100.0)
PLATELETS: 196 10*3/uL (ref 150–400)
RBC: 4.02 MIL/uL — ABNORMAL LOW (ref 4.22–5.81)
RDW: 18.3 % — AB (ref 11.5–15.5)
WBC: 5.7 10*3/uL (ref 4.0–10.5)
nRBC: 0 % (ref 0.0–0.2)

## 2018-07-23 LAB — GLUCOSE, CAPILLARY
GLUCOSE-CAPILLARY: 92 mg/dL (ref 70–99)
Glucose-Capillary: 56 mg/dL — ABNORMAL LOW (ref 70–99)
Glucose-Capillary: 64 mg/dL — ABNORMAL LOW (ref 70–99)
Glucose-Capillary: 52 mg/dL — ABNORMAL LOW (ref 70–99)

## 2018-07-23 LAB — GLUCOSE, RANDOM: Glucose, Bld: 66 mg/dL — ABNORMAL LOW (ref 70–99)

## 2018-07-23 SURGERY — ECHOCARDIOGRAM, TRANSESOPHAGEAL
Anesthesia: Moderate Sedation

## 2018-07-23 MED ORDER — SODIUM CHLORIDE 0.9 % IV SOLN: 100.0000 mL | INTRAVENOUS | Status: DC | PRN

## 2018-07-23 MED ORDER — HEPARIN SODIUM (PORCINE) 1000 UNIT/ML DIALYSIS: 8000.0000 [IU] | INTRAMUSCULAR | Status: DC | PRN

## 2018-07-23 MED ORDER — LIDOCAINE-PRILOCAINE 2.5-2.5 % EX CREA: 1.0000 "application " | TOPICAL_CREAM | CUTANEOUS | Status: DC | PRN

## 2018-07-23 MED ORDER — TRAMADOL HCL 50 MG PO TABS: 50.0000 mg | ORAL_TABLET | Freq: Two times a day (BID) | ORAL | 0 refills | Status: DC | PRN

## 2018-07-23 MED ORDER — ALTEPLASE 2 MG IJ SOLR: 2.0000 mg | Freq: Once | INTRAMUSCULAR | Status: DC | PRN

## 2018-07-23 MED ORDER — DEXTROSE 50 % IV SOLN
INTRAVENOUS | Status: AC
  Administered 2018-07-23: 50 mL
  Filled 2018-07-23: qty 50

## 2018-07-23 MED ORDER — BUTAMBEN-TETRACAINE-BENZOCAINE 2-2-14 % EX AERO
INHALATION_SPRAY | CUTANEOUS | Status: DC | PRN
  Administered 2018-07-23: 2 via TOPICAL

## 2018-07-23 MED ORDER — FENTANYL CITRATE (PF) 100 MCG/2ML IJ SOLN
INTRAMUSCULAR | Status: AC
  Filled 2018-07-23: qty 2

## 2018-07-23 MED ORDER — FENTANYL CITRATE (PF) 100 MCG/2ML IJ SOLN
INTRAMUSCULAR | Status: DC | PRN
  Administered 2018-07-23: 12.5 ug via INTRAVENOUS
  Administered 2018-07-23: 25 ug via INTRAVENOUS
  Administered 2018-07-23: 12.5 ug via INTRAVENOUS

## 2018-07-23 MED ORDER — HEPARIN SODIUM (PORCINE) 1000 UNIT/ML DIALYSIS: 1000.0000 [IU] | INTRAMUSCULAR | Status: DC | PRN

## 2018-07-23 MED ORDER — MIDAZOLAM HCL 10 MG/2ML IJ SOLN
INTRAMUSCULAR | Status: DC | PRN
  Administered 2018-07-23: 2 mg via INTRAVENOUS
  Administered 2018-07-23 (×2): 1 mg via INTRAVENOUS

## 2018-07-23 MED ORDER — SODIUM CHLORIDE 0.9 % IV SOLN
INTRAVENOUS | Status: DC
  Administered 2018-07-23: 11:00:00 via INTRAVENOUS

## 2018-07-23 MED ORDER — PENTAFLUOROPROP-TETRAFLUOROETH EX AERO: 1.0000 | INHALATION_SPRAY | CUTANEOUS | Status: DC | PRN

## 2018-07-23 MED ORDER — TRAMADOL HCL 50 MG PO TABS: 50.0000 mg | ORAL_TABLET | Freq: Two times a day (BID) | ORAL | 0 refills | Status: AC | PRN

## 2018-07-23 MED ORDER — MIDAZOLAM HCL 5 MG/ML IJ SOLN
INTRAMUSCULAR | Status: AC
  Filled 2018-07-23: qty 2

## 2018-07-23 MED ORDER — LIDOCAINE HCL (PF) 1 % IJ SOLN: 5.0000 mL | INTRAMUSCULAR | Status: DC | PRN

## 2018-07-23 NOTE — Progress Notes (Signed)
Subjective: [name] [name] is doing well this morning. He denies shortness of breath, chest pain, and feeling clammy or lightheaded. He remained NPO overnight. He continues to have upper right back pain. He reports that the tramadol helped somewhat, but not completely.  Objective: Vital signs in last 24 hours: Vitals:   07/23/18 1132 07/23/18 1145 07/23/18 1155 07/23/18 1219  BP: (!) 163/81 (!) 153/78 (!) 160/87 (!) 158/89  Pulse: 87 86 84 89  Resp: 20 16 (!) 21 18  Temp: 98.2 F (36.8 C)   97.8 F (36.6 C)  TempSrc: Oral   Oral  SpO2: 91% (!) 88% 100% 95%  Weight:      Height:       Weight change: -3.226 kg  Intake/Output Summary (Last 24 hours) at 07/23/2018 1311 Last data filed at 07/23/2018 0600 Gross per 24 hour  Intake 598 ml  Output 0 ml  Net 598 ml   General appearance: alert, cooperative, appears stated age and no distress Lungs: clear to auscultation bilaterally Heart: regular rate and rhythm, S1, S2 normal and systolic heart sound louder Abdomen: soft, non-tender; bowel sounds normal.  Extremities: extremities normal, atraumatic, no cyanosis or edema   Lab Results: Hgb 9.9 BUN 32 Cr 9.74  CBG today - 56, 64, 92, 52 (most recent) Micro Results: No new results.  Studies/Results: TEE 10/16: mild central MR with mild restriction of anterior leaflet of prosthetic mitral valve with a mean gradient of 9  Medications: I have reviewed the patient's current medications. Scheduled Meds: . amLODipine  10 mg Oral QHS  . aspirin EC  81 mg Oral Daily  . atorvastatin  20 mg Oral QHS  . Chlorhexidine Gluconate Cloth  6 each Topical Q0600  . cinacalcet  180 mg Oral Q M,W,F-HD  . heparin  5,000 Units Subcutaneous Q8H  . isosorbide mononitrate  30 mg Oral Daily  . lisinopril  10 mg Oral Daily  . multivitamin  1 tablet Oral QHS  . sevelamer carbonate  3,200 mg Oral TID WC   Continuous Infusions: PRN Meds:.acetaminophen **OR** acetaminophen, albuterol, lidocaine,  polyethylene glycol, promethazine, traMADol Assessment/Plan: Principal Problem:   Shortness of breath Active Problems:   Elevated troponin   ESRD (end stage renal disease) ([place])   Mitral stenosis   Muscle strain of right upper back  [name] [name] is a 62 yo male with a past medical history of CAD s/p stents (2011, 2012) and CABG x2 (March 2019), mitral valve replacement (March 7353), chronic systolic CHF (EF 29-92%), ESRD on hemodialysis, HTN, HLD, and diabetes, who presented on 10/14 with complaints of dyspnea, weakness, and cold sweats.  Shortness of breath: [name] [name] is no longer symptomatic and no longer requires nasal cannula. This morning, he is satting at 92% on room air. His weight today is 86.3 kg, down from 87.5 kg yesterday. TEE today revealed mild central MR with mild restriction of anterior leaflet of prosthetic mitral valve with a mean gradient of 9. He should follow-up with his cardiothoracic surgeon at [facility], Dr. Concepcion Living, and with Dr. Quay Burow, his cardiologist. 1. Cardiology following  ESRD on HD MWF: [name] [name] will have HD again today, per nephro.  1. Continue home Sevelamer 3200 TID 2. Continue home Cinacalcet 180 mg MWF w/HD  Hypoglycemia: Mr. Gunther was treated in the ED on two separate occassions (07/18/18 and 07/20/18) for hypoglycemia.  1. CBG monitoring --> 56, 64, 92, 52 this AM 2. If hypoglycemic, check serum BG, and if elevated,  check insulin, C-peptide before giving glucose--> BG remained above 66  MS: [name] [name] had bioprosthetic mitral valve replacement in March 2019. Cardiology following. TEE results as above.  HTN: [name] [name] BP has remained elevated throughout his hospital course, ranging 150s-179/70-80s since last night.  1. Continue home Amlodipine 10 mg qd 2. Continue home lisinopril 10 mg qd  Muscle strain of R upper back: [name] [name] continues to complain of right shoulder/muscle pain, though it is somewhat improved with tramadol.    1. Tramadol 50 mg q12h prn 2. Recommend outpatient PT  Diet: renal with fluid restriction Code: full IVF: none VTE ppx: Heparin Dispo: Plan for discharge after TEE  This is a Careers information officer Note.  The care of the patient was discussed with Dr. Myrtie Hawk and the assessment and plan formulated with their assistance.  Please see their attached note for official documentation of the daily encounter.   LOS: 2 days   Dimas Millin, Medical Student 07/23/2018, 1:11 PM

## 2018-07-23 NOTE — Progress Notes (Signed)
  Echocardiogram Echocardiogram Transesophageal has been performed.  [name] [name] [name] 07/23/2018, 12:16 PM

## 2018-07-23 NOTE — Progress Notes (Addendum)
Progress Note  Patient Name: [name] [name] Date of Encounter: 07/23/2018  Primary Cardiologist: Dr. Gwenlyn Found  Subjective   Patient denies SOB today. TEE was performed this AM.  Inpatient Medications    Scheduled Meds: . amLODipine  10 mg Oral QHS  . aspirin EC  81 mg Oral Daily  . atorvastatin  20 mg Oral QHS  . Chlorhexidine Gluconate Cloth  6 each Topical Q0600  . cinacalcet  180 mg Oral Q M,W,F-HD  . heparin  5,000 Units Subcutaneous Q8H  . isosorbide mononitrate  30 mg Oral Daily  . lisinopril  10 mg Oral Daily  . multivitamin  1 tablet Oral QHS  . sevelamer carbonate  3,200 mg Oral TID WC   Continuous Infusions:  PRN Meds: acetaminophen **OR** acetaminophen, albuterol, lidocaine, polyethylene glycol, promethazine, traMADol   Vital Signs    Vitals:   07/22/18 1950 07/22/18 2004 07/23/18 0510 07/23/18 0724  BP: (!) 164/84  (!) 172/92 (!) 160/85  Pulse: 96  98 93  Resp: 14  16 18   Temp: 98.2 F (36.8 C)  98 F (36.7 C) 98.7 F (37.1 C)  TempSrc: Oral  Oral Oral  SpO2: 92%  93% 91%  Weight:  86.3 kg    Height:        Intake/Output Summary (Last 24 hours) at 07/23/2018 0741 Last data filed at 07/23/2018 0600 Gross per 24 hour  Intake 1078 ml  Output 0 ml  Net 1078 ml   Filed Weights   07/21/18 2103 07/22/18 0038 07/22/18 2004  Weight: 89.5 kg 87.5 kg 86.3 kg    Telemetry    No data on monitor this AM, possible error - Personally Reviewed  ECG    None Today  Physical Exam   GEN: No acute distress.   Neck: No JVD Cardiac: RRR, no murmurs, rubs, or gallops.  Respiratory: Trace basilar rales GI: Soft, nontender, non-distended  MS: No edema; No deformity. Neuro:  Nonfocal  Psych: Normal affect   Labs    Chemistry Recent Labs  Lab 07/19/18 2135 07/21/18 0025 07/22/18 0443  NA 134* 136 137  K 3.9 4.6 3.9  CL 90* 91* 94*  CO2 29 29 30   GLUCOSE 109* 96 82  BUN 29* 43* 19  CREATININE 8.05* 10.17* 6.65*  CALCIUM 9.5 8.8* 8.5*  PROT  8.2*  --   --   ALBUMIN 3.7  --   --   AST 18  --   --   ALT 11  --   --   ALKPHOS 55  --   --   BILITOT 0.8  --   --   GFRNONAA 6* 5* 8*  GFRAA 7* 6* 9*  ANIONGAP 15 16* 13     Hematology Recent Labs  Lab 07/21/18 0025 07/22/18 0443 07/23/18 0618  WBC 7.0 6.6 5.7  RBC 3.83* 4.07* 4.02*  HGB 9.5* 9.9* 9.9*  HCT 30.9* 32.3* 32.3*  MCV 80.7 79.4* 80.3  MCH 24.8* 24.3* 24.6*  MCHC 30.7 30.7 30.7  RDW 18.7* 18.5* 18.3*  PLT 189 211 196    Cardiac Enzymes Recent Labs  Lab 07/21/18 0155 07/21/18 0718 07/21/18 1416  TROPONINI 0.08* 0.09* 0.07*    Recent Labs  Lab 07/18/18 1603 07/21/18 0034  TROPIPOC 0.06 0.09*     BNPNo results for input(s): BNP, PROBNP in the last 168 hours.   DDimer No results for input(s): DDIMER in the last 168 hours.   Radiology    No results found.  Cardiac Studies   Echo 06/10/2018: Study Conclusions - Left ventricle: The cavity size was normal. Systolic function was at the lower limits of normal. The estimated ejection fraction was in the range of 50% to 55%. Wall motion was normal; there were no regional wall motion abnormalities. - Ventricular septum: Septal motion showed paradox. These changes are consistent with a post-thoracotomy state. - Mitral valve: A bioprosthesis was present. The findings are consistent with moderate stenosis. Gradients at 90 bpm. Mean gradient (D): 13 mm Hg. Valve area by pressure half-time: 2.1 cm^2. - Right ventricle: The cavity size was moderately dilated. Wall thickness was normal. Systolic function was moderately reduced. - Pulmonary arteries: Systolic pressure was moderately increased. PA peak pressure: 51 mm Hg (S).  Recommendations: 1. Consider transesophageal echocardiography if clinically indicated in order to evaluate mitral prosthesis function. 2. The mitral prosthesis is a new finding (CABG and MVR at [facility] March 2019).  TEE 07/23/18   Results: Normal LV size and function Moderately dilated  RV size and moderately reduced function Dilated  RA Normal LA and LA appendage Normal TV with moderate to severe TR secondary to TV annular dilatation Normal PV with mild PR There is a bioprosthetic mitral valve with mild central MR.  There appears to be some restricted movement of the the anterior leaflet.  The mean MV gradient was 73mmHg.   Mildly thickened and calcified AV leaflets with mild AI Normal interatrial septum with no evidence of shunt by colorflow dopper  Mild atherosclerosis of the thoracic and ascending aorta.    Patient Profile     62 y.o. male with a hx of CAD (s/p stents 2011,2012; s/p CABG x2 12/2017); ESRD (on HD MWF); PVD; CHF (EF 50-55%) who is being evaluated for Dyspnea and Stenosis of prosthetic valve on recent echo. Plan is for TEE for tomorrow to evaluate bioprosthetic mitral valve.  Assessment & Plan    1) Dyspnea, Mitral Stenosis: Patient presented with dyspnea following HD. Recent Echo showed findings consistent with Stenosis of Bioprosthetic Mitral valve, which is abnormal for valve replaced just 7 months ago. Echo also showed elevated PA pressures and patient has mild rales on exam which could be consistent with mitral stenosis. CXR showed Pulmonary edema and chronic L pleural efussion [increased from 10/2 CXR (post thoracentesis)]. Unclear why dyspnea was worsening with HD. - TEE showed Bioprosthetic MV with mild central MR, restricted anterior leaflet. - Patient is nowasymptomatic and stable for out atient follow up, will need to see provider in [facility] about changes to bioprosthetic valve.  2) CAD: S/P BMS in 2011, DES in 2012, and CABG x2 in 12/2017. No chest pain today and shortness of breath is not his typical anginal symptom. Mild Troponinemia at 0.08 > 0.09 > 0.07; likely due to demand (also has baseline of ~0.03 given ESRD). Continue medical therapy. - Continue with home ASA, IMDUR,  Lisinopril, Atorvastatin  3) HTN: Continue home Lisinopril, Amlodipine, and IMDUR  4) CHFrEF: EF now lower limit of normal on recent ECHO (50-55%). EF preserved on TEE  5) PVD: Patient has a history of PVD with recent evaluation for ischemia due to non-healing ulcer of R toe. Angiogram 05/2018 showing occluded anterior tibial with a widely patent peroneal and posterior tibial that vascularized to the dorsal pedal arch. Toe wound healing.   6) ESRD: On HD MWF, Nephrology Following. HD Today.    For questions or updates, please contact [place] Please consult [email or web address] for contact info under  Signed, Neva Seat, MD  07/23/2018, 7:41 AM    Agree with note by Dr. Trilby Drummer  TEE performed today showed mild central MR with mild restriction of the anterior leaflet of prosthetic mitral valve with a mean gradient of 9.  He stable for discharge today.  His volume status is controlled by hemodialysis.  He will follow-up with the cardiothoracic surgeons at [facility] and I will see him back as an outpatient as well.  Lorretta Harp, M.D., [place], [facility], [place] [address] [place], [place]  [id number]  [phone] 07/23/2018 3:01 PM

## 2018-07-23 NOTE — Progress Notes (Signed)
Internal Medicine Attending:   I saw and examined the patient. I reviewed Dr Darcey Nora note and I agree with the resident's findings and plan as documented in the resident's note.  [name] [name] is feeling better, no acute issues.  Underwent TEE this morning mitral valve shows some anterior leaflet restriction but he is stable for discharge from a cardiac standpoint.  He has a new estimated dry weight per nephrology.  Once again I am not convinced that he actually has hypoglycemia, unfortunately my orders were not followed after he had a low CBG this morning he was given an amp of D50, and discussing this with the nurse he was asymptomatic with that CBG reading.  Later on the day of it appears that another low CBG reading was obtained this time a blood glucose was obtained that read 66 however insulin and C-peptide do not appear to have been collected this will need to be worked up at another occasion but it does not meet Whipple's triad of hypoglycemia.

## 2018-07-23 NOTE — Progress Notes (Signed)
Patient returned from dialysis, as soon as patient was back in his room he walked out into hall requesting discharge paperwork. He refused vital signs, CBG check, and medications. Patient did receive a printed AVS, IV was removed, patient is aware prescriptions sent to his pharmacy, and he verbalized understanding of follow-up appointments. Patient left floor via wheelchair with staff member

## 2018-07-23 NOTE — Interval H&P Note (Signed)
History and Physical Interval Note:  07/23/2018 10:36 AM  [name] [name]  has presented today for surgery, with the diagnosis of valve  The various methods of treatment have been discussed with the patient and family. After consideration of risks, benefits and other options for treatment, the patient has consented to  Procedure(s): TRANSESOPHAGEAL ECHOCARDIOGRAM (TEE) (N/A) as a surgical intervention .  The patient's history has been reviewed, patient examined, no change in status, stable for surgery.  I have reviewed the patient's chart and labs.  Questions were answered to the patient's satisfaction.     Fransico Him

## 2018-07-23 NOTE — Progress Notes (Addendum)
[facility] Progress Note   Subjective:   Seen at bedside. No new complaints.    Objective Vitals:   07/23/18 1132 07/23/18 1145 07/23/18 1155 07/23/18 1219  BP: (!) 163/81 (!) 153/78 (!) 160/87 (!) 158/89  Pulse: 87 86 84 89  Resp: 20 16 (!) 21 18  Temp: 98.2 F (36.8 C)   97.8 F (36.6 C)  TempSrc: Oral   Oral  SpO2: 91% (!) 88% 100% 95%  Weight:      Height:       Physical Exam General:NAD, chronically ill appearing male Heart:RRR, +7/5 systolic murmur Lungs:mostly CTAB, +bibasilar crackles, nml WOB Abdomen:soft, NTND Extremities:trace edema Dialysis Access: AVF +b/t   Filed Weights   07/22/18 0038 07/22/18 2004 07/23/18 1033  Weight: 87.5 kg 86.3 kg 86.3 kg    Intake/Output Summary (Last 24 hours) at 07/23/2018 1259 Last data filed at 07/23/2018 0600 Gross per 24 hour  Intake 838 ml  Output 0 ml  Net 838 ml    Additional Objective Labs: Basic Metabolic Panel: Recent Labs  Lab 07/21/18 0025 07/22/18 0443 07/23/18 0618  NA 136 137 137  K 4.6 3.9 4.2  CL 91* 94* 95*  CO2 29 30 28   GLUCOSE 96 82 71  BUN 43* 19 32*  CREATININE 10.17* 6.65* 9.74*  CALCIUM 8.8* 8.5* 8.5*   Liver Function Tests: Recent Labs  Lab 07/19/18 2135  AST 18  ALT 11  ALKPHOS 55  BILITOT 0.8  PROT 8.2*  ALBUMIN 3.7   CBC: Recent Labs  Lab 07/18/18 1601 07/19/18 2135 07/21/18 0025 07/22/18 0443 07/23/18 0618  WBC 7.4 8.0 7.0 6.6 5.7  NEUTROABS  --   --  4.4  --   --   HGB 10.1* 10.4* 9.5* 9.9* 9.9*  HCT 32.6* 33.7* 30.9* 32.3* 32.3*  MCV 81.5 81.8 80.7 79.4* 80.3  PLT 202 194 189 211 196   Cardiac Enzymes: Recent Labs  Lab 07/21/18 0155 07/21/18 0718 07/21/18 1416  TROPONINI 0.08* 0.09* 0.07*   CBG: Recent Labs  Lab 07/22/18 2127 07/23/18 0439 07/23/18 0733 07/23/18 1006 07/23/18 1216  GLUCAP 100* 56* 64* 92 52*   Studies/Results: No results found.  Medications:  . amLODipine  10 mg Oral QHS  . aspirin EC  81 mg Oral Daily   . atorvastatin  20 mg Oral QHS  . Chlorhexidine Gluconate Cloth  6 each Topical Q0600  . cinacalcet  180 mg Oral Q M,W,F-HD  . heparin  5,000 Units Subcutaneous Q8H  . isosorbide mononitrate  30 mg Oral Daily  . lisinopril  10 mg Oral Daily  . multivitamin  1 tablet Oral QHS  . sevelamer carbonate  3,200 mg Oral TID WC    Dialysis Orders: MWF GOC 4h 500/AF 2.0 91kg 2/2 bath P2 AVF Hep 8000 - sensipar 180 - mircera 75 ug last 10/9 - vit D 0.5 ug q hd  Home meds: - amlodipine 10/ lisinopril 10 qd - aspirin 81/ atorvastatin 20 qd/ isosorbide mononitrate 30 qd/ sl ntg prn - ropinirole 0.25 mg hs/ tramadol 50 qid prn - sevelamer carbonate 4 tabs ac tid/ cinacalcet 180 tiw - vitamins/ prns/ hfa  Assessment/Plan: 1. SOB 2/2 volume overload - CXR shows pulmonary edema, per patient losing body weight, weight today 86.3kg pre HD.  Will need new EDW at d/c.  2. Mitral regurgitation s/p valve replacement - concern for stenosis of bioprosthetic mitral valve indicated on recent ECHO, TEE performed today showing restrictive movement of anterior leaflets -  Per cardio.  3. ESRD - on HD MWF. K 4.2. HD today per regular schedule after procedure.  4. Anemia of CKD - Hgb 9.9. mircera recently given. Follow trends. 5. Metabolic bone disease - Ca 8.5, check phos - continue sensipar, VDRA and binders. 6. HTN- BP elevated, continue home meds. Expect improvement post HD. 7. Nutrition - Renal diet w/fluid restrictions.  8. CAD s/p stenting and CABG 9. GERD 10. DMT2  Jen Mow, PA-C [facility] Pager: [phone] 07/23/2018,12:59 PM  LOS: 2 days   Pt seen, examined and agree w A/P as above.  Kelly Splinter MD [employer] pager [phone]   07/23/2018, 1:26 PM

## 2018-07-23 NOTE — Discharge Instructions (Signed)
[name], it was a pleasure taking care of you during your hospital stay. While you were here, you were treated for your symptom of shortness of breath. After dialysis, your shortness of breath improved. Your new baseline dry weight is ~86.3 kg (190 lbs).   You should come back to the emergency room if you experience worsening shortness of breath, if you are unable to lay flat without feeling short of breath, or if you gain 2 lbs within one day.  You have a follow-up appointment with your PCP, Dr. Neva Seat, on Wednesday, October 23 at 2:15 pm. If you have any questions, please call [phone]. You should also schedule follow-up appointments with both your cardiologist, Dr. Quay Burow, and your cardiothoracic surgeon at [facility].

## 2018-07-23 NOTE — CV Procedure (Signed)
    PROCEDURE NOTE:  Procedure:  Transesophageal echocardiogram Operator:  Fransico Him, MD Indications:  Mitral stenosis of mitral bioprosthesis Complications: None  During this procedure the patient is administered a total of Versed 4 mg and Fentanyl 50 mg to achieve and maintain moderate conscious sedation.  The patient's heart rate, blood pressure, and oxygen saturation are monitored continuously during the procedure. The period of conscious sedation is 19 minutes, of which I was present face-to-face 100% of this time.  Results: Normal LV size and function Moderately dilated  RV size and moderately reduced function Dilated  RA Normal LA and LA appendage Normal TV with moderate to severe TR secondary to TV annular dilatation Normal PV with mild PR There is a bioprosthetic mitral valve with mild central MR.  There appears to be some restricted movement of the the anterior leaflet.  The mean MV gradient was 59mmHg.   Mildly thickened and calcified AV leaflets with mild AI Normal interatrial septum with no evidence of shunt by colorflow dopper  Mild atherosclerosis of the thoracic and ascending aorta.  The patient tolerated the procedure well and was transferred back to their room in stable condition.  Signed: Fransico Him, MD [facility]

## 2018-07-24 ENCOUNTER — Encounter (HOSPITAL_COMMUNITY): Payer: Self-pay | Admitting: Cardiology

## 2018-07-24 ENCOUNTER — Ambulatory Visit
Payer: Medicare Other | Attending: Student in an Organized Health Care Education/Training Program | Admitting: Physical Therapy

## 2018-07-24 ENCOUNTER — Other Ambulatory Visit: Payer: Self-pay | Admitting: Internal Medicine

## 2018-07-24 ENCOUNTER — Telehealth: Payer: Self-pay | Admitting: Dietician

## 2018-07-24 ENCOUNTER — Ambulatory Visit (INDEPENDENT_AMBULATORY_CARE_PROVIDER_SITE_OTHER): Payer: Medicare Other | Admitting: Internal Medicine

## 2018-07-24 ENCOUNTER — Other Ambulatory Visit: Payer: Self-pay

## 2018-07-24 VITALS — BP 172/85 | HR 92 | Temp 97.6°F | Ht 72.0 in | Wt 195.3 lb

## 2018-07-24 DIAGNOSIS — E1122 Type 2 diabetes mellitus with diabetic chronic kidney disease: Secondary | ICD-10-CM | POA: Diagnosis not present

## 2018-07-24 DIAGNOSIS — Z87891 Personal history of nicotine dependence: Secondary | ICD-10-CM | POA: Diagnosis not present

## 2018-07-24 DIAGNOSIS — N186 End stage renal disease: Secondary | ICD-10-CM

## 2018-07-24 DIAGNOSIS — F329 Major depressive disorder, single episode, unspecified: Secondary | ICD-10-CM

## 2018-07-24 DIAGNOSIS — R202 Paresthesia of skin: Secondary | ICD-10-CM | POA: Diagnosis not present

## 2018-07-24 DIAGNOSIS — I340 Nonrheumatic mitral (valve) insufficiency: Secondary | ICD-10-CM

## 2018-07-24 DIAGNOSIS — E162 Hypoglycemia, unspecified: Secondary | ICD-10-CM | POA: Diagnosis not present

## 2018-07-24 DIAGNOSIS — D509 Iron deficiency anemia, unspecified: Secondary | ICD-10-CM | POA: Diagnosis not present

## 2018-07-24 DIAGNOSIS — E1129 Type 2 diabetes mellitus with other diabetic kidney complication: Secondary | ICD-10-CM | POA: Diagnosis not present

## 2018-07-24 DIAGNOSIS — Z23 Encounter for immunization: Secondary | ICD-10-CM | POA: Diagnosis not present

## 2018-07-24 DIAGNOSIS — Z7982 Long term (current) use of aspirin: Secondary | ICD-10-CM | POA: Diagnosis not present

## 2018-07-24 DIAGNOSIS — N2581 Secondary hyperparathyroidism of renal origin: Secondary | ICD-10-CM | POA: Diagnosis not present

## 2018-07-24 DIAGNOSIS — R011 Cardiac murmur, unspecified: Secondary | ICD-10-CM

## 2018-07-24 DIAGNOSIS — J811 Chronic pulmonary edema: Secondary | ICD-10-CM

## 2018-07-24 DIAGNOSIS — Z992 Dependence on renal dialysis: Secondary | ICD-10-CM | POA: Diagnosis not present

## 2018-07-24 DIAGNOSIS — D631 Anemia in chronic kidney disease: Secondary | ICD-10-CM | POA: Diagnosis not present

## 2018-07-24 LAB — GLUCOSE, CAPILLARY: Glucose-Capillary: 69 mg/dL — ABNORMAL LOW (ref 70–99)

## 2018-07-24 MED ORDER — ACCU-CHEK FASTCLIX LANCETS MISC: 0 refills | Status: DC

## 2018-07-24 MED ORDER — BLOOD GLUCOSE MONITOR KIT: PACK | 0 refills | Status: DC

## 2018-07-24 MED ORDER — GLUCOSE BLOOD VI STRP: ORAL_STRIP | 0 refills | Status: DC

## 2018-07-24 NOTE — Telephone Encounter (Signed)
Will send in prescription for glucometer for patient to monitor is blood sugar as needed for symptoms of hypoglycemia. Inpatient Labs showed largely normal blood sugar with a couple of drops into the 60s. Will send generic prescription, unclear if this will be covered.  Rx cannot be e-prescribed, will leave in office for patient.  Pearson Grippe, DO IM PGY-2

## 2018-07-24 NOTE — Patient Instructions (Addendum)
Thank you for allowing [place] to provide your care today. Today we discussed Shortness of Breath and Hypoglycemia.   Today we will draw labs for basic metabolic panel to check your electrolytes. We will also check your insulin and c-peptide to evaluate your hypoglycemia.   We also have supplied you with a glucometer. Please check your blood sugar whenever you feel symptoms of hypoglycemia. If you have severe symptoms of hypoglycemia that does not respond to drinking orange juice, please go to the ED.   Please follow-up in two weeks and bring your glucometer.   Should you have any questions or concerns please call the internal medicine clinic at [phone].

## 2018-07-24 NOTE — Progress Notes (Signed)
   CC: hypoglycemia  HPI:  [name] [name] is a 62 y.o. with PMH as below presenting for hospital follow-up of pulmonary edema secondary to prosthetic mitral valve regurge or ESRD. Today he is more concerned about ongoing intermittent hypoglycemia.   Please see A&P for assessment of the patient's chronic medical conditions.   Past Medical History:  Diagnosis Date  . Anemia   . Anginal pain ([place])   . CAD (coronary artery disease)    a. per [facility]where s/p 3.42mm x 54mm Vision BMS to mid LAD 12/2009 and Xience DES to mid LAD 10/2010.  [name] Chronic diastolic CHF (congestive heart failure) ([place])   . Colon polyps   . Daily headache   . ESRD on dialysis [facility]) since ~ 2008   "MWF; Jene[facility] Rinks" (03/04/2017)  . GERD (gastroesophageal reflux disease)   . H/O TIA (transient ischemic attack) 04/01/2015  . H/O TIA (transient ischemic attack) 04/01/2015  . Heart murmur   . Hematochezia    a. 2014: colonscopy, which showed moderately-sized internal hemorrhoids, two 26mm polyps in transverse colon and ascending colon that were resected, five 2-67mm polyps in sigmoid colon, descending colon, transverse colon, and ascending colon that were resected. An upper endoscopy was performed and showed normal esophagus, stomach, and duodenum.  . Hematuria    a. H/o hematuria 2014 with cystoscopy that was unrevealing for his source of hematuria. He underwent a kidney ultrasound on 10/14 that showed mildly echogenic and scarred kidneys compatible with medical renal disease, without hydronephrosis or renal calculi.  [name] History of blood transfusion    "had colonoscopy done; they had to give me some blood"  . History of kidney stones   . Hyperlipidemia   . Hypertension   . On home oxygen therapy    "2L prn" (07/21/2015); "been off it for awhile" (03/04/2017)  . Pneumonia   . Renal insufficiency   . 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 Type II diabetes mellitus ([place])   . Wears dentures     Review of Systems:   Review of Systems  Respiratory: Negative for cough, shortness of breath and wheezing.   Cardiovascular: Negative for chest pain and leg swelling.  Neurological: Positive for tingling. Negative for dizziness and sensory change.   Physical Exam:  Constitution: NAD, sitting comfortably Eyes: EOM intact, no scleral icterus Cardio: 2/6 RUS systolic murmer, RRR Respiratory: Left lower crackles, other CTA, no wheezing or rhonchi MSK: no edema, moving all extrimities Skin: c/d/i    Vitals:   07/24/18 1426  BP: (!) 172/85  Pulse: 92  Temp: 97.6 F (36.4 C)  TempSrc: Oral  SpO2: 97%  Weight: 195 lb 4.8 oz (88.6 kg)  Height: 6' (1.829 m)     Assessment & Plan:   See Encounters Tab for problem based charting.  Patient seen with Dr. Rebeca Alert

## 2018-07-24 NOTE — Assessment & Plan Note (Addendum)
Patient states this has been an issue since he lost weight and began HD. Previously on mediations for TIIDM. He states he becomes shaky and sweaty and can tell when he is becoming hypoglycemic.   - glucometer - c-peptide & insulin - f/u next week for am fasting labs  - f/u in two weeks

## 2018-07-24 NOTE — Telephone Encounter (Signed)
Being seen today in [facility] with Dr Sharon Seller.

## 2018-07-24 NOTE — Telephone Encounter (Signed)
[name] [name] called asking for a glucometer says his sugar dropped Friday, Saturday and Sunday while he was in the hospital and he is afraid it will drop again. He was released from the hospital last night and Has not checked his blood sugar since then. He is feeling pretty good, drinking more soda (encouraged milk and water and balanced meal instead) to be sure it doesn't drop again. He confirmed that he is not taking any diabetes medicine. Wants to be seen today if possible. Told him I would route this to the triage nuirse and his doctor for their input.

## 2018-07-24 NOTE — Telephone Encounter (Signed)
Talked to pt - stated he would like to have a meter. Offered an appt to eval his blood sugars; stated he rather see Dr Trilby Drummer who does not have any open appt today. I will send his concerns to Dr Trilby Drummer.

## 2018-07-24 NOTE — Assessment & Plan Note (Signed)
Recently admitted to the hospital with shortness of breath and shown to have pulmonary edema and left pleural effusion. He continues to have crackles in LLL on auscultation but states symptoms have improved. He does not appear overloaded on PE exam today. Unclear if pulmonary edema was secondary to ESRD or mitral valve leaflet causing MR on TEE shown on 10/16. He is supposed to f/u with his cardiothoracic surgeon, Dr. Arlyn Dunning, and cardiologist, Dr. Quay Burow. He states he tried to call them but has not yet received a phone call.   - f/u with cardiothroracic surgeon and cardiology, will follow recs

## 2018-07-25 DIAGNOSIS — N186 End stage renal disease: Secondary | ICD-10-CM | POA: Diagnosis not present

## 2018-07-25 DIAGNOSIS — Z23 Encounter for immunization: Secondary | ICD-10-CM | POA: Diagnosis not present

## 2018-07-25 DIAGNOSIS — E1129 Type 2 diabetes mellitus with other diabetic kidney complication: Secondary | ICD-10-CM | POA: Diagnosis not present

## 2018-07-25 DIAGNOSIS — D509 Iron deficiency anemia, unspecified: Secondary | ICD-10-CM | POA: Diagnosis not present

## 2018-07-25 DIAGNOSIS — D631 Anemia in chronic kidney disease: Secondary | ICD-10-CM | POA: Diagnosis not present

## 2018-07-25 DIAGNOSIS — N2581 Secondary hyperparathyroidism of renal origin: Secondary | ICD-10-CM | POA: Diagnosis not present

## 2018-07-25 LAB — BMP8+ANION GAP
ANION GAP: 22 mmol/L — AB (ref 10.0–18.0)
BUN/Creatinine Ratio: 4 — ABNORMAL LOW (ref 10–24)
BUN: 31 mg/dL — ABNORMAL HIGH (ref 8–27)
CALCIUM: 9.9 mg/dL (ref 8.6–10.2)
CO2: 25 mmol/L (ref 20–29)
CREATININE: 8.43 mg/dL — AB (ref 0.76–1.27)
Chloride: 93 mmol/L — ABNORMAL LOW (ref 96–106)
GFR calc Af Amer: 7 mL/min/{1.73_m2} — ABNORMAL LOW (ref >59)
GFR, EST NON AFRICAN AMERICAN: 6 mL/min/{1.73_m2} — AB (ref >59)
Glucose: 71 mg/dL (ref 65–99)
POTASSIUM: 4.8 mmol/L (ref 3.5–5.2)
Sodium: 140 mmol/L (ref 134–144)

## 2018-07-25 LAB — INSULIN AND C-PEPTIDE, SERUM
C PEPTIDE: 11.9 ng/mL — AB (ref 1.1–4.4)
INSULIN: 6.8 u[IU]/mL (ref 2.6–24.9)

## 2018-07-25 NOTE — Assessment & Plan Note (Signed)
PHQ-9 Score 17. Was unable to discuss patient's symptoms of depression before he left.   - discuss depressive symptoms at follow-up in two weeks.

## 2018-07-28 ENCOUNTER — Emergency Department (HOSPITAL_COMMUNITY)
Admission: EM | Admit: 2018-07-28 | Discharge: 2018-07-28 | Disposition: A | Discharge status: Home / Self Care | Payer: Medicare Other | Attending: Emergency Medicine | Admitting: Emergency Medicine

## 2018-07-28 ENCOUNTER — Encounter (HOSPITAL_COMMUNITY): Payer: Self-pay | Admitting: Emergency Medicine

## 2018-07-28 ENCOUNTER — Other Ambulatory Visit: Payer: Self-pay

## 2018-07-28 DIAGNOSIS — R0902 Hypoxemia: Secondary | ICD-10-CM | POA: Diagnosis not present

## 2018-07-28 DIAGNOSIS — N186 End stage renal disease: Secondary | ICD-10-CM | POA: Diagnosis not present

## 2018-07-28 DIAGNOSIS — E1129 Type 2 diabetes mellitus with other diabetic kidney complication: Secondary | ICD-10-CM | POA: Diagnosis not present

## 2018-07-28 DIAGNOSIS — I2510 Atherosclerotic heart disease of native coronary artery without angina pectoris: Secondary | ICD-10-CM | POA: Diagnosis not present

## 2018-07-28 DIAGNOSIS — I10 Essential (primary) hypertension: Secondary | ICD-10-CM | POA: Diagnosis not present

## 2018-07-28 DIAGNOSIS — Z23 Encounter for immunization: Secondary | ICD-10-CM | POA: Diagnosis not present

## 2018-07-28 DIAGNOSIS — I509 Heart failure, unspecified: Secondary | ICD-10-CM | POA: Insufficient documentation

## 2018-07-28 DIAGNOSIS — D631 Anemia in chronic kidney disease: Secondary | ICD-10-CM | POA: Diagnosis not present

## 2018-07-28 DIAGNOSIS — D509 Iron deficiency anemia, unspecified: Secondary | ICD-10-CM | POA: Diagnosis not present

## 2018-07-28 DIAGNOSIS — R531 Weakness: Secondary | ICD-10-CM | POA: Diagnosis not present

## 2018-07-28 DIAGNOSIS — Z992 Dependence on renal dialysis: Secondary | ICD-10-CM | POA: Diagnosis not present

## 2018-07-28 DIAGNOSIS — N2581 Secondary hyperparathyroidism of renal origin: Secondary | ICD-10-CM | POA: Diagnosis not present

## 2018-07-28 LAB — CBC
HEMATOCRIT: 33.2 % — AB (ref 39.0–52.0)
Hemoglobin: 10.3 g/dL — ABNORMAL LOW (ref 13.0–17.0)
MCH: 25.1 pg — ABNORMAL LOW (ref 26.0–34.0)
MCHC: 31 g/dL (ref 30.0–36.0)
MCV: 81 fL (ref 80.0–100.0)
NRBC: 0 % (ref 0.0–0.2)
Platelets: 199 10*3/uL (ref 150–400)
RBC: 4.1 MIL/uL — AB (ref 4.22–5.81)
RDW: 18.3 % — AB (ref 11.5–15.5)
WBC: 7.1 10*3/uL (ref 4.0–10.5)

## 2018-07-28 LAB — BASIC METABOLIC PANEL
ANION GAP: 14 (ref 5–15)
BUN: 24 mg/dL — ABNORMAL HIGH (ref 6–20)
CO2: 30 mmol/L (ref 22–32)
Calcium: 8.5 mg/dL — ABNORMAL LOW (ref 8.9–10.3)
Chloride: 90 mmol/L — ABNORMAL LOW (ref 98–111)
Creatinine, Ser: 6.05 mg/dL — ABNORMAL HIGH (ref 0.61–1.24)
GFR calc non Af Amer: 9 mL/min — ABNORMAL LOW (ref >60)
GFR, EST AFRICAN AMERICAN: 11 mL/min — AB (ref >60)
Glucose, Bld: 103 mg/dL — ABNORMAL HIGH (ref 70–99)
Potassium: 4.4 mmol/L (ref 3.5–5.1)
Sodium: 134 mmol/L — ABNORMAL LOW (ref 135–145)

## 2018-07-28 LAB — CBG MONITORING, ED: Glucose-Capillary: 99 mg/dL (ref 70–99)

## 2018-07-28 NOTE — ED Triage Notes (Signed)
Per [facility] pt from fire station for weakness after dialysis. Reports that he normally is weak after dialysis and had it today.  Per EMS pt refused to go to [facility] due to always being admitted when he goes there. Pt was just discharged from there last week for elevated troponin.

## 2018-07-28 NOTE — Discharge Instructions (Signed)
Your sugar today was normal. Your labs were unchanged from your baseline. Be sure to regularly check your sugars. Call your doctor to ask about your glucometer (sugar meter). Eat regular meals. Follow up with your primary care doctor to ensure resolution of symptoms.

## 2018-07-28 NOTE — ED Provider Notes (Addendum)
[facility] DEPT Provider Note   CSN: [id number] Arrival date & time: 07/28/18  1837     History   Chief Complaint Chief Complaint  Patient presents with  . Weakness    HPI [name] [name] is a 62 y.o. male.  62 y/o male with history of CAD, chronic diastolic CHF, ESRD (on MWF dialysis, last dialyzed today for a full session) presents to the emergency department from fire station.  He states that he went to the fire station to have a sugar checked because he felt as though it was low.  He states this has been occurring a lot lately.  He feels weak when he has a low sugar.  Reports that it was 88 at the fire department.  Was encouraged to come to the hospital by EMS.  Allegedly refused to go to [facility] over fear of being admitted there.  He is ultimately very irritated at the fact that he has not been allowed to eat today.  He presently has no complaints.  He denies any pain, shortness of breath.  States that he no longer feels weak.  States that he tried to pick up his glucometer, but there was an issue with his Medicaid.  Denies any changes being made to his daily medications at his recent outpatient primary doctor visit on 07/24/2018.  Is scheduled for outpatient follow-up again in 2 weeks.  The history is provided by the patient. No language interpreter was used.  Weakness     Past Medical History:  Diagnosis Date  . Anemia   . Anginal pain ([place])   . CAD (coronary artery disease)    a. per [facility] s/p 3.74mm x 22mm Vision BMS to mid LAD 12/2009 and Xience DES to mid LAD 10/2010.  [name] Chronic diastolic CHF (congestive heart failure) ([place])   . Colon polyps   . Daily headache   . ESRD on dialysis [facility]) since ~ 2008   "MWF; Jeneen Rinks" (03/04/2017)  . GERD (gastroesophageal reflux disease)   . H/O TIA (transient ischemic attack) 04/01/2015  . H/O TIA (transient ischemic attack) 04/01/2015  . Heart murmur   . Hematochezia    a. 2014: colonscopy,  which showed moderately-sized internal hemorrhoids, two 36mm polyps in transverse colon and ascending colon that were resected, five 2-55mm polyps in sigmoid colon, descending colon, transverse colon, and ascending colon that were resected. An upper endoscopy was performed and showed normal esophagus, stomach, and duodenum.  . Hematuria    a. H/o hematuria 2014 with cystoscopy that was unrevealing for his source of hematuria. He underwent a kidney ultrasound on 10/14 that showed mildly echogenic and scarred kidneys compatible with medical renal disease, without hydronephrosis or renal calculi.  [name] History of blood transfusion    "had colonoscopy done; they had to give me some blood"  . History of kidney stones   . Hyperlipidemia   . Hypertension   . On home oxygen therapy    "2L prn" (07/21/2015); "been off it for awhile" (03/04/2017)  . Pneumonia   . Renal insufficiency   . 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 Type II diabetes mellitus ([place])   . Wears dentures     Patient Active Problem List   Diagnosis Date Noted  . Hypoglycemia 07/24/2018  . Muscle strain of right upper back 07/22/2018  . Shortness of breath 07/21/2018  . Mitral stenosis 07/21/2018  . Shoulder pain, right 07/10/2018  .  Pleural effusion on left   . ESRD on dialysis ([place])   . SOB (shortness of breath) 06/06/2018  . Critical lower limb ischemia 05/27/2018  . Hx of CABG 02/02/2018  . Hypervolemia associated with renal insufficiency 01/26/2018  . Restless leg syndrome 01/14/2018  . Anemia 01/14/2018  . Tobacco use 10/29/2017  . Mild cognitive impairment 06/13/2017  . Chest pain 06/07/2017  . Skin ulcer of toe of right foot, limited to breakdown of skin ([place])   . Callus of foot 03/07/2017  . Pulmonary hypertension ([place]) 07/28/2016  . Stroke-like symptoms 06/30/2016  . Peripheral neuropathy 05/07/2016  . S/P coronary artery stent placement 05/07/2016  . Nausea and vomiting 04/02/2016  .  ESRD (end stage renal disease) ([place]) 03/18/2016  . Mitral regurgitation   . Diabetic neuropathy ([place]) 07/29/2015  . Depression 07/22/2015  . Elevated troponin 06/04/2015  . GERD (gastroesophageal reflux disease) 06/04/2015  . Malnutrition of moderate degree ([place]) 05/17/2015  . Thrombocytopenia ([place]) 05/16/2015  . Weight loss 05/16/2015  . Diastolic dysfunction-grade 2 12/13/2014  . 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 Arm pain, right 05/25/2014  . Numbness 05/25/2014  . Hypertension 04/27/2014  . CAD -S/P LAD BMS 2011, LAD DES 2012- patent cors Feb 2016 04/27/2014  . Diabetes mellitus type 2, diet-controlled ([place]) 04/27/2014  . Background diabetic retinopathy ([place]) 08/13/2012  . Primary localized osteoarthrosis, lower leg 04/15/2012  . Senile nuclear sclerosis 11/29/2010  . Vitreous hemorrhage ([place]) 11/29/2010  . Atherosclerotic heart disease of native coronary artery without angina pectoris 12/12/2009  . Hypercholesterolemia 11/30/2003    Past Surgical History:  Procedure Laterality Date  . ABDOMINAL AORTOGRAM W/LOWER EXTREMITY N/A 05/29/2018   Procedure: ABDOMINAL AORTOGRAM W/LOWER EXTREMITY;  Surgeon: Lorretta Harp, MD;  Location: [facility] CV LAB;  Service: Cardiovascular;  Laterality: N/A;  . AV FISTULA PLACEMENT Left ~ 2007   "upper arm"  . CARDIAC CATHETERIZATION  "several"  . CORONARY ANGIOPLASTY WITH STENT PLACEMENT  "several"  . CORONARY ARTERY BYPASS GRAFT     3 grafts  . CYSTOSCOPY W/ STONE MANIPULATION  X2?  . EYE SURGERY Bilateral    "laser OR for hemorrhage"  . IR THORACENTESIS ASP PLEURAL SPACE W/IMG GUIDE  07/09/2018  . LEFT HEART CATHETERIZATION WITH CORONARY ANGIOGRAM N/A 11/23/2014   Procedure: LEFT HEART CATHETERIZATION WITH CORONARY ANGIOGRAM;  Surgeon: Troy Sine, MD;  Location: [facility] CATH LAB;  Service: Cardiovascular;  Laterality: N/A;  . LITHOTRIPSY  X1  . MITRAL VALVE REPLACEMENT    . REVISON OF ARTERIOVENOUS FISTULA Left  [id number]/78/5885   Procedure: PLICATION OF LEFT ARM ARTERIOVENOUS FISTULA;  Surgeon: Angelia Mould, MD;  Location: [facility];  Service: Vascular;  Laterality: Left;  . REVISON OF ARTERIOVENOUS FISTULA Left 0/27/7412   Procedure: PLICATE ANEURYSM  OF LEFT ARTERIOVENOUS FISTULA;  Surgeon: Waynetta Sandy, MD;  Location: [facility];  Service: Vascular;  Laterality: Left;  . TEE WITHOUT CARDIOVERSION N/A 07/23/2018   Procedure: TRANSESOPHAGEAL ECHOCARDIOGRAM (TEE);  Surgeon: Sueanne Margarita, MD;  Location: [facility] ENDOSCOPY;  Service: Cardiovascular;  Laterality: N/A;        Home Medications    Prior to Admission medications   Medication Sig Start Date End Date Taking? Authorizing Provider  ACCU-CHEK FASTCLIX LANCETS MISC Check up to seven times per week 07/24/18   Seawell, Jaimie A, DO  albuterol (PROVENTIL HFA;VENTOLIN HFA) 108 (90 Base) MCG/ACT inhaler Inhale 2 puffs into the lungs every 6 (six) hours as needed for wheezing or shortness of breath. 06/07/18  Neva Seat, MD  amLODipine (NORVASC) 10 MG tablet Take 1 tablet (10 mg total) by mouth at bedtime. 04/17/18   Neva Seat, MD  aspirin EC 81 MG tablet Take 81 mg by mouth daily.    [provider]  atorvastatin (LIPITOR) 20 MG tablet Take 20 mg by mouth at bedtime.     [provider]  cadexomer iodine (IODOSORB) 0.9 % gel Apply 1 application topically daily as needed for wound care. Patient not taking: Reported on 05/27/2018 05/06/18   Landis Martins, DPM  cinacalcet (SENSIPAR) 90 MG tablet Take 180 mg by mouth every Monday, Wednesday, and Friday with hemodialysis.     [provider]  glucose blood (ACCU-CHEK GUIDE) test strip Check up to seven times per week. 07/24/18   Seawell, Jaimie A, DO  isosorbide mononitrate (IMDUR) 30 MG 24 hr tablet Take 1 tablet (30 mg total) by mouth daily. 07/10/18 07/10/19  Neva Seat, MD  lisinopril (PRINIVIL,ZESTRIL) 10 MG tablet Take 1 tablet (10 mg total) by  mouth daily. 07/10/18   Neva Seat, MD  multivitamin (RENA-VIT) TABS tablet TAKE 1 TABLET BY MOUTH ONCE DAILY AT BEDTIME Patient taking differently: Take 1 tablet by mouth daily.  07/02/18   Neva Seat, MD  nitroGLYCERIN (NITROSTAT) 0.4 MG SL tablet Place 1 tablet (0.4 mg total) under the tongue every 5 (five) minutes as needed for chest pain. 07/10/18   Neva Seat, MD  ondansetron (ZOFRAN) 4 MG tablet Take 1 tablet (4 mg total) by mouth 4 (four) times daily as needed for nausea or vomiting. 03/04/18   Wynona Luna, MD  rOPINIRole (REQUIP) 0.25 MG tablet Take 1 tablet (0.25 mg total) by mouth at bedtime. Patient not taking: Reported on 07/18/2018 02/25/18   Bartholomew Crews, MD  sevelamer carbonate (RENVELA) 800 MG tablet Take 4 tablets (3,200 mg total) by mouth 3 (three) times daily with meals. Patient taking differently: Take 1,600-3,200 mg by mouth See admin instructions. Take 4 tablets (3200 mg) by mouth three times daily with meals and 2 tablets (1600 mg) with snacks 03/12/16   Eulogio Bear U, DO  traMADol (ULTRAM) 50 MG tablet Take 1 tablet (50 mg total) by mouth every 12 (twelve) hours as needed for up to 7 days for moderate pain. 07/23/18 07/30/18  Katherine Roan, MD    Family History Family History  Problem Relation Age of Onset  . Bone cancer Mother   . Anuerysm Father   . Hypertension Unknown   . Diabetes type II Daughter   . Ovarian cancer Sister     Social History Social History   Tobacco Use  . Smoking status: Former Smoker    Packs/day: 0.30    Years: 8.00    Pack years: 2.40    Types: Cigars  . Smokeless tobacco: Never Used  . Tobacco comment: black and mild every 3-4 days  Substance Use Topics  . Alcohol use: No    Alcohol/week: 0.0 standard drinks  . Drug use: No     Allergies   Enalapril; Latex; and Tape   Review of Systems Review of Systems  Neurological: Positive for weakness.  Ten systems reviewed and are negative for  acute change, except as noted in the HPI.    Physical Exam Updated Vital Signs BP (!) 179/95   Pulse 95   Temp 97.7 F (36.5 C) (Oral)   Resp 20   SpO2 95%   Physical Exam  Constitutional: He is oriented to person, place, and  time. He appears well-developed and well-nourished. No distress.  Nontoxic appearing and in NAD  HENT:  Head: Normocephalic and atraumatic.  Eyes: Conjunctivae and EOM are normal. No scleral icterus.  Neck: Normal range of motion.  Cardiovascular: Normal rate, regular rhythm and intact distal pulses.  Pulmonary/Chest: Effort normal. No stridor. No respiratory distress. He has no wheezes.  Lungs grossly clear. No wheezes or rales.  Musculoskeletal: Normal range of motion.  Dialysis fistula to the LUE  Neurological: He is alert and oriented to person, place, and time.  Skin: Skin is warm and dry. No rash noted. He is not diaphoretic. No erythema. No pallor.  Psychiatric: He has a normal mood and affect. His behavior is normal.  Nursing note and vitals reviewed.    ED Treatments / Results  Labs (all labs ordered are listed, but only abnormal results are displayed) Labs Reviewed  BASIC METABOLIC PANEL - Abnormal; Notable for the following components:      Result Value   Sodium 134 (*)    Chloride 90 (*)    Glucose, Bld 103 (*)    BUN 24 (*)    Creatinine, Ser 6.05 (*)    Calcium 8.5 (*)    GFR calc non Af Amer 9 (*)    GFR calc Af Amer 11 (*)    All other components within normal limits  CBC - Abnormal; Notable for the following components:   RBC 4.10 (*)    Hemoglobin 10.3 (*)    HCT 33.2 (*)    MCH 25.1 (*)    RDW 18.3 (*)    All other components within normal limits  CBG MONITORING, ED    EKG ED ECG REPORT   Date: 09/29/2018  Rate: 96  Rhythm: normal sinus rhythm  QRS Axis: normal  Intervals: normal  ST/T Wave abnormalities: borderline repolarization abnormality  Conduction Disutrbances:nonspecific intraventricular conduction  delay  Narrative Interpretation: NSR with nonspecific IVCD  Old EKG Reviewed: none available  I have personally reviewed the EKG tracing and agree with the computerized printout as noted.   Radiology No results found.  Procedures Procedures (including critical care time)  Medications Ordered in ED Medications - No data to display   Initial Impression / Assessment and Plan / ED Course  I have reviewed the triage vital signs and the nursing notes.  Pertinent labs & imaging results that were available during my care of the patient were reviewed by me and considered in my medical decision making (see chart for details).     62 year old male presents to the emergency department for evaluation of transient weakness.  He had recent hospital admission for SOB and elevated troponin favored to be 2/2 stress of fluid overload from ESRD hx versus MVS; dyspnea improved after dialysis.  He has been seen in the emergency department recently for similar complaints of weakness.  States that his weakness is attributed to his sugar being low.  He went to the fire department to have his sugar checked.  Reports not eating a lot today due to dialysis.  Is asymptomatic at this time.  Denies any chest pain or shortness of breath.  He states that he wants to leave the emergency department.  Laboratory work-up reviewed.  Patient had a sugar of 103.  His hypochloremia is chronic.  Laboratory work-up is otherwise stable compared to prior visits.  Elevated creatinine consistent with dialysis history.  He has no leukocytosis.  Stable anemia.  Systolic blood pressure seems to range from 150-170  on prior visits.  Diastolic averages 04-59.  Vitals are also largely c/w baseline.  I do not believe the patient's request for discharge at this time is unreasonable.  I believe he can be safely discharged for follow-up with his primary care doctor.  He has been encouraged to follow-up regarding the status of his glucometer.   Discussed the importance of eating regular meals.  Return precautions discussed and provided. Patient discharged in stable condition with no unaddressed concerns.   Final Clinical Impressions(s) / ED Diagnoses   Final diagnoses:  Weakness    ED Discharge Orders    None       Beverely Pace 07/28/18 2221    Lajean Saver, MD 07/28/18 2250    Antonietta Breach, PA-C 09/29/18 [id number]    Lajean Saver, MD 09/30/18 1324

## 2018-07-28 NOTE — ED Notes (Signed)
Patient calls out stating, "I'm leaving. I'm not getting what I want, I simply came to the ER to have my bloodsugar checked. I didn't ask for all this other stuff y'all are doing" Patient asked to wait to speak with nurse or PA before leaving, to see if there was anything that could be done. Will notify PA and primary RN of patient's requests.

## 2018-07-28 NOTE — ED Notes (Signed)
Claiborne Billings, [place], Clarise Cruz, RN and Musician made aware of patients requests/wants.

## 2018-07-29 NOTE — Progress Notes (Signed)
Internal Medicine Clinic Attending  I saw and evaluated the patient.  I personally confirmed the key portions of the history and exam documented by Dr. Sharon Seller and I reviewed pertinent patient test results.  The assessment, diagnosis, and plan were formulated together and I agree with the documentation in the resident's note.  Recurrent episodes of hypoglycemia, low again today. Sent insulin and c-peptide levels, although more informative if glucose is <50. C-peptide still seems elevated for this glucose level and insulin level inappropriately normal. Should come in for repeat labs as soon as able, and may need to consider supervised 72 hour fast inpatient to evaluate hypoglycemia and work up further.   Lenice Pressman, M.D., Ph.D.

## 2018-07-30 ENCOUNTER — Other Ambulatory Visit: Payer: Self-pay

## 2018-07-30 ENCOUNTER — Encounter (HOSPITAL_COMMUNITY): Payer: Self-pay

## 2018-07-30 ENCOUNTER — Emergency Department (HOSPITAL_COMMUNITY): Payer: Medicare Other

## 2018-07-30 ENCOUNTER — Ambulatory Visit: Payer: Medicare Other

## 2018-07-30 ENCOUNTER — Emergency Department (HOSPITAL_COMMUNITY)
Admission: EM | Admit: 2018-07-30 | Discharge: 2018-07-30 | Disposition: A | Discharge status: Home / Self Care | Payer: Medicare Other | Attending: Emergency Medicine | Admitting: Emergency Medicine

## 2018-07-30 DIAGNOSIS — Z23 Encounter for immunization: Secondary | ICD-10-CM | POA: Diagnosis not present

## 2018-07-30 DIAGNOSIS — R9431 Abnormal electrocardiogram [ECG] [EKG]: Secondary | ICD-10-CM | POA: Diagnosis not present

## 2018-07-30 DIAGNOSIS — I5032 Chronic diastolic (congestive) heart failure: Secondary | ICD-10-CM | POA: Insufficient documentation

## 2018-07-30 DIAGNOSIS — Z87891 Personal history of nicotine dependence: Secondary | ICD-10-CM | POA: Diagnosis not present

## 2018-07-30 DIAGNOSIS — I110 Hypertensive heart disease with heart failure: Secondary | ICD-10-CM | POA: Insufficient documentation

## 2018-07-30 DIAGNOSIS — K5900 Constipation, unspecified: Secondary | ICD-10-CM | POA: Diagnosis not present

## 2018-07-30 DIAGNOSIS — E1129 Type 2 diabetes mellitus with other diabetic kidney complication: Secondary | ICD-10-CM | POA: Diagnosis not present

## 2018-07-30 DIAGNOSIS — Z79899 Other long term (current) drug therapy: Secondary | ICD-10-CM | POA: Insufficient documentation

## 2018-07-30 DIAGNOSIS — N186 End stage renal disease: Secondary | ICD-10-CM | POA: Diagnosis not present

## 2018-07-30 DIAGNOSIS — R079 Chest pain, unspecified: Secondary | ICD-10-CM | POA: Insufficient documentation

## 2018-07-30 DIAGNOSIS — D509 Iron deficiency anemia, unspecified: Secondary | ICD-10-CM | POA: Diagnosis not present

## 2018-07-30 DIAGNOSIS — N2581 Secondary hyperparathyroidism of renal origin: Secondary | ICD-10-CM | POA: Diagnosis not present

## 2018-07-30 DIAGNOSIS — D631 Anemia in chronic kidney disease: Secondary | ICD-10-CM | POA: Diagnosis not present

## 2018-07-30 LAB — CBC
HEMATOCRIT: 33.6 % — AB (ref 39.0–52.0)
Hemoglobin: 10.3 g/dL — ABNORMAL LOW (ref 13.0–17.0)
MCH: 24.5 pg — ABNORMAL LOW (ref 26.0–34.0)
MCHC: 30.7 g/dL (ref 30.0–36.0)
MCV: 79.8 fL — ABNORMAL LOW (ref 80.0–100.0)
NRBC: 0 % (ref 0.0–0.2)
PLATELETS: 241 10*3/uL (ref 150–400)
RBC: 4.21 MIL/uL — ABNORMAL LOW (ref 4.22–5.81)
RDW: 17.7 % — AB (ref 11.5–15.5)
WBC: 6.8 10*3/uL (ref 4.0–10.5)

## 2018-07-30 LAB — BASIC METABOLIC PANEL
ANION GAP: 17 — AB (ref 5–15)
BUN: 14 mg/dL (ref 6–20)
CALCIUM: 8.5 mg/dL — AB (ref 8.9–10.3)
CO2: 30 mmol/L (ref 22–32)
CREATININE: 4.88 mg/dL — AB (ref 0.61–1.24)
Chloride: 90 mmol/L — ABNORMAL LOW (ref 98–111)
GFR calc Af Amer: 14 mL/min — ABNORMAL LOW (ref >60)
GFR calc non Af Amer: 12 mL/min — ABNORMAL LOW (ref >60)
GLUCOSE: 73 mg/dL (ref 70–99)
Potassium: 3.9 mmol/L (ref 3.5–5.1)
SODIUM: 137 mmol/L (ref 135–145)

## 2018-07-30 LAB — TROPONIN I

## 2018-07-30 LAB — I-STAT TROPONIN, ED: Troponin i, poc: 0.02 ng/mL (ref 0.00–0.08)

## 2018-07-30 MED ORDER — MAGNESIUM CITRATE PO SOLN
1.0000 | Freq: Once | ORAL | Status: AC
  Administered 2018-07-30: 1 via ORAL
  Filled 2018-07-30: qty 296

## 2018-07-30 NOTE — ED Triage Notes (Signed)
Pt reports being at dialysis today when his chest started to hurt, pt denies any cough or SOB. Was only able to get about 30 minutes of his dialysis treatment, did get full treatment on Monday.

## 2018-07-30 NOTE — ED Notes (Addendum)
Pt reports mid cp which started after receiving dialysis today.  Pt states pain has resolved.  He endorses constipation, LBM x 4 days ago.  He has tried laxatives without relief.  He takes medication for dialysis which makes him constipated.

## 2018-07-30 NOTE — ED Notes (Signed)
E-signature not available, pt verbalized understanding of DC instructions  

## 2018-07-30 NOTE — ED Provider Notes (Signed)
Emergency Department Provider Note   I have reviewed the triage vital signs and the nursing notes.   HISTORY  Chief Complaint Chest Pain   HPI [name] [name] is a 62 y.o. male with multiple medical problems as documented below the presents the emerge department today with chest pain during dialysis.  Patient states this happens often.  It almost always resolves after dialysis which has resolved prior to be seen in.  He has no associated shortness of breath, nausea, vomiting.  He does state he has had some constipation recently however is still having some bowel movements.  No abdominal tenderness.  No fevers.  Not similar to previous episodes of heart attack when he had chest pain. No other associated or modifying symptoms.     Past Medical History:  Diagnosis Date  . Anemia   . Anginal pain ([place])   . CAD (coronary artery disease)    a. per [facility] s/p 3.55mm x 58mm Vision BMS to mid LAD 12/2009 and Xience DES to mid LAD 10/2010.  [name] Chronic diastolic CHF (congestive heart failure) ([place])   . Colon polyps   . Daily headache   . ESRD on dialysis [facility]) since ~ 2008   "MWF; Jeneen Rinks" (03/04/2017)  . GERD (gastroesophageal reflux disease)   . H/O TIA (transient ischemic attack) 04/01/2015  . H/O TIA (transient ischemic attack) 04/01/2015  . Heart murmur   . Hematochezia    a. 2014: colonscopy, which showed moderately-sized internal hemorrhoids, two 49mm polyps in transverse colon and ascending colon that were resected, five 2-68mm polyps in sigmoid colon, descending colon, transverse colon, and ascending colon that were resected. An upper endoscopy was performed and showed normal esophagus, stomach, and duodenum.  . Hematuria    a. H/o hematuria 2014 with cystoscopy that was unrevealing for his source of hematuria. He underwent a kidney ultrasound on 10/14 that showed mildly echogenic and scarred kidneys compatible with medical renal disease, without hydronephrosis or renal  calculi.  [name] History of blood transfusion    "had colonoscopy done; they had to give me some blood"  . History of kidney stones   . Hyperlipidemia   . Hypertension   . On home oxygen therapy    "2L prn" (07/21/2015); "been off it for awhile" (03/04/2017)  . Pneumonia   . Renal insufficiency   . 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 Type II diabetes mellitus ([place])   . Wears dentures     Patient Active Problem List   Diagnosis Date Noted  . Hypoglycemia 07/24/2018  . Muscle strain of right upper back 07/22/2018  . Shortness of breath 07/21/2018  . Mitral stenosis 07/21/2018  . Shoulder pain, right 07/10/2018  . Pleural effusion on left   . ESRD on dialysis ([place])   . SOB (shortness of breath) 06/06/2018  . Critical lower limb ischemia 05/27/2018  . Hx of CABG 02/02/2018  . Hypervolemia associated with renal insufficiency 01/26/2018  . Restless leg syndrome 01/14/2018  . Anemia 01/14/2018  . Tobacco use 10/29/2017  . Mild cognitive impairment 06/13/2017  . Chest pain 06/07/2017  . Skin ulcer of toe of right foot, limited to breakdown of skin ([place])   . Callus of foot 03/07/2017  . Pulmonary hypertension ([place]) 07/28/2016  . Stroke-like symptoms 06/30/2016  . Peripheral neuropathy 05/07/2016  . S/P coronary artery stent placement 05/07/2016  . Nausea and vomiting 04/02/2016  . ESRD (end stage renal disease) ([place])  03/18/2016  . Mitral regurgitation   . Diabetic neuropathy ([place]) 07/29/2015  . Depression 07/22/2015  . Elevated troponin 06/04/2015  . GERD (gastroesophageal reflux disease) 06/04/2015  . Malnutrition of moderate degree ([place]) 05/17/2015  . Thrombocytopenia ([place]) 05/16/2015  . Weight loss 05/16/2015  . Diastolic dysfunction-grade 2 12/13/2014  . 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 Arm pain, right 05/25/2014  . Numbness 05/25/2014  . Hypertension 04/27/2014  . CAD -S/P LAD BMS 2011, LAD DES 2012- patent cors Feb 2016  04/27/2014  . Diabetes mellitus type 2, diet-controlled ([place]) 04/27/2014  . Background diabetic retinopathy ([place]) 08/13/2012  . Primary localized osteoarthrosis, lower leg 04/15/2012  . Senile nuclear sclerosis 11/29/2010  . Vitreous hemorrhage ([place]) 11/29/2010  . Atherosclerotic heart disease of native coronary artery without angina pectoris 12/12/2009  . Hypercholesterolemia 11/30/2003    Past Surgical History:  Procedure Laterality Date  . ABDOMINAL AORTOGRAM W/LOWER EXTREMITY N/A 05/29/2018   Procedure: ABDOMINAL AORTOGRAM W/LOWER EXTREMITY;  Surgeon: Lorretta Harp, MD;  Location: [facility];  Service: Cardiovascular;  Laterality: N/A;  . AV FISTULA PLACEMENT Left ~ 2007   "upper arm"  . CARDIAC CATHETERIZATION  "several"  . CORONARY ANGIOPLASTY WITH STENT PLACEMENT  "several"  . CORONARY ARTERY BYPASS GRAFT     3 grafts  . CYSTOSCOPY W/ STONE MANIPULATION  X2?  . EYE SURGERY Bilateral    "laser OR for hemorrhage"  . IR THORACENTESIS ASP PLEURAL SPACE W/IMG GUIDE  07/09/2018  . LEFT HEART CATHETERIZATION WITH CORONARY ANGIOGRAM N/A 11/23/2014   Procedure: LEFT HEART CATHETERIZATION WITH CORONARY ANGIOGRAM;  Surgeon: Troy Sine, MD;  Location: [facility];  Service: Cardiovascular;  Laterality: N/A;  . LITHOTRIPSY  X1  . MITRAL VALVE REPLACEMENT    . REVISON OF ARTERIOVENOUS FISTULA Left 16/07/9603   Procedure: PLICATION OF LEFT ARM ARTERIOVENOUS FISTULA;  Surgeon: Angelia Mould, MD;  Location: [facility];  Service: Vascular;  Laterality: Left;  . REVISON OF ARTERIOVENOUS FISTULA Left 5/40/9811   Procedure: PLICATE ANEURYSM  OF LEFT ARTERIOVENOUS FISTULA;  Surgeon: Waynetta Sandy, MD;  Location: [facility];  Service: Vascular;  Laterality: Left;  . TEE WITHOUT CARDIOVERSION N/A 07/23/2018   Procedure: TRANSESOPHAGEAL ECHOCARDIOGRAM (TEE);  Surgeon: Sueanne Margarita, MD;  [facility] ENDOSCOPY;  Service: Cardiovascular;  Laterality: N/A;    Current  Outpatient Rx  . Order #: [id number] Class: Normal  . Order #: [id number] Class: Normal  . Order #: [id number] Class: Historical Med  . Order #: [id number] Class: Historical Med  . Order #: [id number] Class: Historical Med  . Order #: [id number] Class: Normal  . Order #: [id number] Class: Normal  . Order #: [id number] Class: Normal  . Order #: [id number] Class: Normal  . Order #: [id number] Class: Normal  . Order #: [id number] Class: Normal  . Order #: [id number] Class: Normal  . Order #: [id number] Class: Historical Med  . Order #: [id number] Class: Normal  . Order #: [id number] Class: Normal  . Order #: [id number] Class: Normal    Allergies Enalapril; Latex; and Tape  Family History  Problem Relation Age of Onset  . Bone cancer Mother   . Anuerysm Father   . Hypertension Unknown   . Diabetes type II Daughter   . Ovarian cancer Sister     Social History Social History   Tobacco Use  . Smoking status: Former Smoker    Packs/day: 0.30    Years: 8.00    Pack years: 2.40    Types: Cigars  . Smokeless tobacco: Never Used  . Tobacco comment: black  and mild every 3-4 days  Substance Use Topics  . Alcohol use: No    Alcohol/week: 0.0 standard drinks  . Drug use: No    Review of Systems  All other systems negative except as documented in the HPI. All pertinent positives and negatives as reviewed in the HPI. ____________________________________________   PHYSICAL EXAM:  VITAL SIGNS: ED Triage Vitals  Enc Vitals Group     BP 07/30/18 1627 (!) 170/79     Pulse Rate 07/30/18 1627 89     Resp 07/30/18 1627 18     Temp 07/30/18 1627 98.9 F (37.2 C)     Temp Source 07/30/18 1627 Oral     SpO2 07/30/18 1627 99 %     Weight 07/30/18 1351 197 lb (89.4 kg)     Height 07/30/18 1351 6' (1.829 m)    Constitutional: Alert and oriented. Well appearing and in no acute distress. Eyes: Conjunctivae are normal. PERRL. EOMI. Head: Atraumatic. Nose: No congestion/rhinnorhea. Mouth/Throat: Mucous  membranes are moist.  Oropharynx non-erythematous. Neck: No stridor.  No meningeal signs.   Cardiovascular: Normal rate, regular rhythm. Good peripheral circulation. Grossly normal heart sounds.   Respiratory: Normal respiratory effort.  No retractions. Lungs CTAB. Gastrointestinal: Soft and nontender. No distention.  Musculoskeletal: No lower extremity tenderness nor edema. No gross deformities of extremities. Neurologic:  Normal speech and language. No gross focal neurologic deficits are appreciated.  Skin:  Skin is warm, dry and intact. No rash noted.   ____________________________________________   LABS (all labs ordered are listed, but only abnormal results are displayed)  Labs Reviewed  BASIC METABOLIC PANEL - Abnormal; Notable for the following components:      Result Value   Chloride 90 (*)    Creatinine, Ser 4.88 (*)    Calcium 8.5 (*)    GFR calc non Af Amer 12 (*)    GFR calc Af Amer 14 (*)    Anion gap 17 (*)    All other components within normal limits  CBC - Abnormal; Notable for the following components:   RBC 4.21 (*)    Hemoglobin 10.3 (*)    HCT 33.6 (*)    MCV 79.8 (*)    MCH 24.5 (*)    RDW 17.7 (*)    All other components within normal limits  TROPONIN I  I-STAT TROPONIN, ED   ____________________________________________  EKG   EKG Interpretation  Date/Time:  Wednesday July 30 2018 13:46:54 EDT Ventricular Rate:  94 PR Interval:  150 QRS Duration: 98 QT Interval:  408 QTC Calculation: 510 R Axis:   78 Text Interpretation:  Normal sinus rhythm Left ventricular hypertrophy Cannot rule out Septal infarct , age undetermined Prolonged QT Abnormal ECG No significant change since last tracing Confirmed by Merrily Pew [id number]) on 07/30/2018 5:28:04 PM       ____________________________________________  RADIOLOGY  Dg Chest 2 View  Result Date: 07/30/2018 CLINICAL DATA:  Chest pain, nausea, recent dialysis EXAM: CHEST - 2 VIEW COMPARISON:   07/21/2018 FINDINGS: Stable cardiomegaly. Mitral valve replacement noted. Mild perihilar and basilar interstitial edema pattern with a small left effusion compatible with residual CHF. CHF pattern is better when compared to 07/21/2018. No pneumothorax. Trachea midline. Atherosclerosis of the aorta. IMPRESSION: Improving CHF pattern compared to the prior exam. Stable cardiomegaly, residual basilar interstitial edema and small left effusion. Electronically Signed   By: Jerilynn Mages.  Shick M.D.   On: 07/30/2018 14:53    ____________________________________________   INITIAL IMPRESSION / ASSESSMENT AND  PLAN / ED COURSE  troponins negative. Doubt ACS. Continually symptom free here. Requesting discharge. Will give dose of mg citrate just prior to discharge to help with constipation at home.    Pertinent labs & imaging results that were available during my care of the patient were reviewed by me and considered in my medical decision making (see chart for details).  ____________________________________________  FINAL CLINICAL IMPRESSION(S) / ED DIAGNOSES  Final diagnoses:  Nonspecific chest pain     MEDICATIONS GIVEN DURING THIS VISIT:  Medications  magnesium citrate solution 1 Bottle (1 Bottle Oral Given 07/30/18 2141)     NEW OUTPATIENT MEDICATIONS STARTED DURING THIS VISIT:  Discharge Medication List as of 07/30/2018  9:28 PM      Note:  This note was prepared with assistance of Dragon voice recognition software. Occasional wrong-word or sound-a-like substitutions may have occurred due to the inherent limitations of voice recognition software.   Merrily Pew, MD 07/30/18 2259

## 2018-07-31 ENCOUNTER — Emergency Department (HOSPITAL_COMMUNITY)
Admission: EM | Admit: 2018-07-31 | Discharge: 2018-07-31 | Disposition: A | Discharge status: Home / Self Care | Payer: Medicare Other | Attending: Emergency Medicine | Admitting: Emergency Medicine

## 2018-07-31 ENCOUNTER — Emergency Department (HOSPITAL_COMMUNITY): Payer: Medicare Other

## 2018-07-31 ENCOUNTER — Encounter (HOSPITAL_COMMUNITY): Payer: Self-pay | Admitting: Emergency Medicine

## 2018-07-31 ENCOUNTER — Other Ambulatory Visit: Payer: Self-pay | Admitting: Pharmacist

## 2018-07-31 DIAGNOSIS — Z87891 Personal history of nicotine dependence: Secondary | ICD-10-CM | POA: Diagnosis not present

## 2018-07-31 DIAGNOSIS — I5032 Chronic diastolic (congestive) heart failure: Secondary | ICD-10-CM | POA: Diagnosis not present

## 2018-07-31 DIAGNOSIS — K5900 Constipation, unspecified: Secondary | ICD-10-CM | POA: Insufficient documentation

## 2018-07-31 DIAGNOSIS — Z91040 Latex allergy status: Secondary | ICD-10-CM | POA: Diagnosis not present

## 2018-07-31 DIAGNOSIS — Z79899 Other long term (current) drug therapy: Secondary | ICD-10-CM | POA: Diagnosis not present

## 2018-07-31 DIAGNOSIS — Z7982 Long term (current) use of aspirin: Secondary | ICD-10-CM | POA: Diagnosis not present

## 2018-07-31 DIAGNOSIS — I110 Hypertensive heart disease with heart failure: Secondary | ICD-10-CM | POA: Insufficient documentation

## 2018-07-31 DIAGNOSIS — Z9861 Coronary angioplasty status: Principal | ICD-10-CM

## 2018-07-31 DIAGNOSIS — R109 Unspecified abdominal pain: Secondary | ICD-10-CM | POA: Insufficient documentation

## 2018-07-31 DIAGNOSIS — E119 Type 2 diabetes mellitus without complications: Secondary | ICD-10-CM | POA: Diagnosis not present

## 2018-07-31 DIAGNOSIS — I2510 Atherosclerotic heart disease of native coronary artery without angina pectoris: Secondary | ICD-10-CM

## 2018-07-31 LAB — COMPREHENSIVE METABOLIC PANEL
ALBUMIN: 3.8 g/dL (ref 3.5–5.0)
ALT: 13 U/L (ref 0–44)
AST: 14 U/L — AB (ref 15–41)
Alkaline Phosphatase: 62 U/L (ref 38–126)
Anion gap: 16 — ABNORMAL HIGH (ref 5–15)
BILIRUBIN TOTAL: 0.8 mg/dL (ref 0.3–1.2)
BUN: 21 mg/dL — AB (ref 6–20)
CALCIUM: 8.8 mg/dL — AB (ref 8.9–10.3)
CO2: 28 mmol/L (ref 22–32)
CREATININE: 6.46 mg/dL — AB (ref 0.61–1.24)
Chloride: 91 mmol/L — ABNORMAL LOW (ref 98–111)
GFR calc Af Amer: 10 mL/min — ABNORMAL LOW (ref >60)
GFR calc non Af Amer: 8 mL/min — ABNORMAL LOW (ref >60)
Glucose, Bld: 90 mg/dL (ref 70–99)
POTASSIUM: 4.1 mmol/L (ref 3.5–5.1)
Sodium: 135 mmol/L (ref 135–145)
TOTAL PROTEIN: 8.9 g/dL — AB (ref 6.5–8.1)

## 2018-07-31 LAB — CBC
HCT: 36 % — ABNORMAL LOW (ref 39.0–52.0)
Hemoglobin: 10.8 g/dL — ABNORMAL LOW (ref 13.0–17.0)
MCH: 24.3 pg — AB (ref 26.0–34.0)
MCHC: 30 g/dL (ref 30.0–36.0)
MCV: 80.9 fL (ref 80.0–100.0)
PLATELETS: 250 10*3/uL (ref 150–400)
RBC: 4.45 MIL/uL (ref 4.22–5.81)
RDW: 17.8 % — ABNORMAL HIGH (ref 11.5–15.5)
WBC: 8.4 10*3/uL (ref 4.0–10.5)
nRBC: 0 % (ref 0.0–0.2)

## 2018-07-31 LAB — LIPASE, BLOOD: Lipase: 40 U/L (ref 11–51)

## 2018-07-31 MED ORDER — ATORVASTATIN CALCIUM 20 MG PO TABS: 20.0000 mg | ORAL_TABLET | Freq: Every day | ORAL | 11 refills | Status: AC

## 2018-07-31 NOTE — Telephone Encounter (Signed)
Refill approved.

## 2018-07-31 NOTE — Discharge Instructions (Signed)
Continue with over-the-counter MiraLAX for management of your symptoms.  Follow-up with your primary care doctor as scheduled.

## 2018-07-31 NOTE — ED Notes (Signed)
PT states understanding of care given, follow up care. Pt has no more questions at this time. PT  ambulated from ED to car with a steady gait.

## 2018-07-31 NOTE — ED Provider Notes (Signed)
[facility] EMERGENCY DEPARTMENT Provider Note   CSN: [id number] Arrival date & time: 07/31/18  0030     History   Chief Complaint Chief Complaint  Patient presents with  . Constipation  . Emesis    HPI [name] [name] is a 62 y.o. male.   62 year old male with multiple medical problems and frequent ED visits recently, presents to the emergency department following discharge earlier tonight.  States that he has been experiencing nausea and vomiting since he was discharged.  Reports that he has not had a bowel movement since Saturday.  On my assessment, the patient states that his symptoms have resolved.  He notes, "it looks like that laxative finally kicked in".  Reports having a bowel movement in the department.  He has no complaints of abdominal pain at this time.  No nausea.  Denies fevers, melena, hematochezia.  Was dialyzed for a full session earlier yesterday.  The history is provided by the patient. No language interpreter was used.  Constipation    Emesis      Past Medical History:  Diagnosis Date  . Anemia   . Anginal pain ([place])   . CAD (coronary artery disease)    a. per [facility] s/p 3.11mm x 53mm Vision BMS to mid LAD 12/2009 and Xience DES to mid LAD 10/2010.  [name] Chronic diastolic CHF (congestive heart failure) ([place])   . Colon polyps   . Daily headache   . ESRD on dialysis [facility]) since ~ 2008   "MWF; Jeneen Rinks" (03/04/2017)  . GERD (gastroesophageal reflux disease)   . H/O TIA (transient ischemic attack) 04/01/2015  . H/O TIA (transient ischemic attack) 04/01/2015  . Heart murmur   . Hematochezia    a. 2014: colonscopy, which showed moderately-sized internal hemorrhoids, two 26mm polyps in transverse colon and ascending colon that were resected, five 2-50mm polyps in sigmoid colon, descending colon, transverse colon, and ascending colon that were resected. An upper endoscopy was performed and showed normal esophagus, stomach, and duodenum.  .  Hematuria    a. H/o hematuria 2014 with cystoscopy that was unrevealing for his source of hematuria. He underwent a kidney ultrasound on 10/14 that showed mildly echogenic and scarred kidneys compatible with medical renal disease, without hydronephrosis or renal calculi.  [name] History of blood transfusion    "had colonoscopy done; they had to give me some blood"  . History of kidney stones   . Hyperlipidemia   . Hypertension   . On home oxygen therapy    "2L prn" (07/21/2015); "been off it for awhile" (03/04/2017)  . Pneumonia   . Renal insufficiency   . 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 Type II diabetes mellitus ([place])   . Wears dentures     Patient Active Problem List   Diagnosis Date Noted  . Hypoglycemia 07/24/2018  . Muscle strain of right upper back 07/22/2018  . Shortness of breath 07/21/2018  . Mitral stenosis 07/21/2018  . Shoulder pain, right 07/10/2018  . Pleural effusion on left   . ESRD on dialysis ([place])   . SOB (shortness of breath) 06/06/2018  . Critical lower limb ischemia 05/27/2018  . Hx of CABG 02/02/2018  . Hypervolemia associated with renal insufficiency 01/26/2018  . Restless leg syndrome 01/14/2018  . Anemia 01/14/2018  . Tobacco use 10/29/2017  . Mild cognitive impairment 06/13/2017  . Chest pain 06/07/2017  . Skin ulcer of toe of right foot, limited  to breakdown of skin ([place])   . Callus of foot 03/07/2017  . Pulmonary hypertension ([place]) 07/28/2016  . Stroke-like symptoms 06/30/2016  . Peripheral neuropathy 05/07/2016  . S/P coronary artery stent placement 05/07/2016  . Nausea and vomiting 04/02/2016  . ESRD (end stage renal disease) ([place]) 03/18/2016  . Mitral regurgitation   . Diabetic neuropathy ([place]) 07/29/2015  . Depression 07/22/2015  . Elevated troponin 06/04/2015  . GERD (gastroesophageal reflux disease) 06/04/2015  . Malnutrition of moderate degree ([place]) 05/17/2015  . Thrombocytopenia ([place]) 05/16/2015  .  Weight loss 05/16/2015  . Diastolic dysfunction-grade 2 12/13/2014  . 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 Arm pain, right 05/25/2014  . Numbness 05/25/2014  . Hypertension 04/27/2014  . CAD -S/P LAD BMS 2011, LAD DES 2012- patent cors Feb 2016 04/27/2014  . Diabetes mellitus type 2, diet-controlled ([place]) 04/27/2014  . Background diabetic retinopathy ([place]) 08/13/2012  . Primary localized osteoarthrosis, lower leg 04/15/2012  . Senile nuclear sclerosis 11/29/2010  . Vitreous hemorrhage ([place]) 11/29/2010  . Atherosclerotic heart disease of native coronary artery without angina pectoris 12/12/2009  . Hypercholesterolemia 11/30/2003    Past Surgical History:  Procedure Laterality Date  . ABDOMINAL AORTOGRAM W/LOWER EXTREMITY N/A 05/29/2018   Procedure: ABDOMINAL AORTOGRAM W/LOWER EXTREMITY;  Surgeon: Lorretta Harp, MD;  Location: [facility];  Service: Cardiovascular;  Laterality: N/A;  . AV FISTULA PLACEMENT Left ~ 2007   "upper arm"  . CARDIAC CATHETERIZATION  "several"  . CORONARY ANGIOPLASTY WITH STENT PLACEMENT  "several"  . CORONARY ARTERY BYPASS GRAFT     3 grafts  . CYSTOSCOPY W/ STONE MANIPULATION  X2?  . EYE SURGERY Bilateral    "laser OR for hemorrhage"  . IR THORACENTESIS ASP PLEURAL SPACE W/IMG GUIDE  07/09/2018  . LEFT HEART CATHETERIZATION WITH CORONARY ANGIOGRAM N/A 11/23/2014   Procedure: LEFT HEART CATHETERIZATION WITH CORONARY ANGIOGRAM;  Surgeon: Troy Sine, MD;  Location: [facility] CATH LAB;  Service: Cardiovascular;  Laterality: N/A;  . LITHOTRIPSY  X1  . MITRAL VALVE REPLACEMENT    . REVISON OF ARTERIOVENOUS FISTULA Left 53/97/6734   Procedure: PLICATION OF LEFT ARM ARTERIOVENOUS FISTULA;  Surgeon: Angelia Mould, MD;  Location: [facility];  Service: Vascular;  Laterality: Left;  . REVISON OF ARTERIOVENOUS FISTULA Left 1/93/7902   Procedure: PLICATE ANEURYSM  OF LEFT ARTERIOVENOUS FISTULA;  Surgeon: Waynetta Sandy, MD;  Location:  [facility];  Service: Vascular;  Laterality: Left;  . TEE WITHOUT CARDIOVERSION N/A 07/23/2018   Procedure: TRANSESOPHAGEAL ECHOCARDIOGRAM (TEE);  Surgeon: Sueanne Margarita, MD;  Location: [facility] ENDOSCOPY;  Service: Cardiovascular;  Laterality: N/A;        Home Medications    Prior to Admission medications   Medication Sig Start Date End Date Taking? Authorizing Provider  ACCU-CHEK FASTCLIX LANCETS MISC Check up to seven times per week 07/24/18   Seawell, Jaimie A, DO  albuterol (PROVENTIL HFA;VENTOLIN HFA) 108 (90 Base) MCG/ACT inhaler Inhale 2 puffs into the lungs every 6 (six) hours as needed for wheezing or shortness of breath. 06/07/18   Neva Seat, MD  amLODipine (NORVASC) 10 MG tablet Take 1 tablet (10 mg total) by mouth at bedtime. 04/17/18   Neva Seat, MD  aspirin EC 81 MG tablet Take 81 mg by mouth daily.    [provider]  atorvastatin (LIPITOR) 20 MG tablet Take 20 mg by mouth at bedtime.     [provider]  cinacalcet (SENSIPAR) 90 MG tablet Take 180 mg by mouth  every Monday, Wednesday, and Friday with hemodialysis.     [provider]  glucose blood (ACCU-CHEK GUIDE) test strip Check up to seven times per week. 07/24/18   Seawell, Jaimie A, DO  isosorbide mononitrate (IMDUR) 30 MG 24 hr tablet Take 1 tablet (30 mg total) by mouth daily. 07/10/18 07/10/19  Neva Seat, MD  lisinopril (PRINIVIL,ZESTRIL) 10 MG tablet Take 1 tablet (10 mg total) by mouth daily. 07/10/18   Neva Seat, MD  multivitamin (RENA-VIT) TABS tablet TAKE 1 TABLET BY MOUTH ONCE DAILY AT BEDTIME Patient taking differently: Take 1 tablet by mouth daily.  07/02/18   Neva Seat, MD  nitroGLYCERIN (NITROSTAT) 0.4 MG SL tablet Place 1 tablet (0.4 mg total) under the tongue every 5 (five) minutes as needed for chest pain. 07/10/18   Neva Seat, MD  ondansetron (ZOFRAN) 4 MG tablet Take 1 tablet (4 mg total) by mouth 4 (four) times daily as needed for nausea or  vomiting. 03/04/18   Wynona Luna, MD  rOPINIRole (REQUIP) 0.25 MG tablet Take 1 tablet (0.25 mg total) by mouth at bedtime. 02/25/18   Bartholomew Crews, MD  sevelamer carbonate (RENVELA) 800 MG tablet Take 4 tablets (3,200 mg total) by mouth 3 (three) times daily with meals. Patient taking differently: Take 1,600-3,200 mg by mouth See admin instructions. Take 4 tablets (3200 mg) by mouth three times daily with meals and 2 tablets (1600 mg) with snacks 03/12/16   Vann, Jessica U, DO  SIMVASTATIN PO Take 1 tablet by mouth daily.    [provider]    Family History Family History  Problem Relation Age of Onset  . Bone cancer Mother   . Anuerysm Father   . Hypertension Unknown   . Diabetes type II Daughter   . Ovarian cancer Sister     Social History Social History   Tobacco Use  . Smoking status: Former Smoker    Packs/day: 0.30    Years: 8.00    Pack years: 2.40    Types: Cigars  . Smokeless tobacco: Never Used  . Tobacco comment: black and mild every 3-4 days  Substance Use Topics  . Alcohol use: No    Alcohol/week: 0.0 standard drinks  . Drug use: No     Allergies   Enalapril; Latex; and Tape   Review of Systems Review of Systems  Gastrointestinal: Positive for constipation and vomiting.  Ten systems reviewed and are negative for acute change, except as noted in the HPI.    Physical Exam Updated Vital Signs BP (!) 166/88 (BP Location: Right Arm)   Pulse (!) 103   Temp 98.5 F (36.9 C) (Oral)   Resp 18   SpO2 98%   Physical Exam  Constitutional: He is oriented to person, place, and time. He appears well-developed and well-nourished. No distress.  Nontoxic appearing and in NAD  HENT:  Head: Normocephalic and atraumatic.  Eyes: Conjunctivae and EOM are normal. No scleral icterus.  Neck: Normal range of motion.  Cardiovascular: Normal rate, regular rhythm and intact distal pulses.  Pulmonary/Chest: Effort normal. No respiratory distress.    Respirations even and unlabored  Abdominal: Soft. He exhibits no distension and no mass. There is no tenderness. There is no guarding.  Soft, nontender, nondistended abdomen.  Musculoskeletal: Normal range of motion.  Neurological: He is alert and oriented to person, place, and time. He exhibits normal muscle tone. Coordination normal.  Skin: Skin is warm and dry. No rash noted. He is not diaphoretic.  No erythema. No pallor.  Psychiatric: He has a normal mood and affect. His behavior is normal.  Nursing note and vitals reviewed.    ED Treatments / Results  Labs (all labs ordered are listed, but only abnormal results are displayed) Labs Reviewed  COMPREHENSIVE METABOLIC PANEL - Abnormal; Notable for the following components:      Result Value   Chloride 91 (*)    BUN 21 (*)    Creatinine, Ser 6.46 (*)    Calcium 8.8 (*)    Total Protein 8.9 (*)    AST 14 (*)    GFR calc non Af Amer 8 (*)    GFR calc Af Amer 10 (*)    Anion gap 16 (*)    All other components within normal limits  CBC - Abnormal; Notable for the following components:   Hemoglobin 10.8 (*)    HCT 36.0 (*)    MCH 24.3 (*)    RDW 17.8 (*)    All other components within normal limits  LIPASE, BLOOD    EKG None  Radiology Dg Chest 2 View  Result Date: 07/30/2018 CLINICAL DATA:  Chest pain, nausea, recent dialysis EXAM: CHEST - 2 VIEW COMPARISON:  07/21/2018 FINDINGS: Stable cardiomegaly. Mitral valve replacement noted. Mild perihilar and basilar interstitial edema pattern with a small left effusion compatible with residual CHF. CHF pattern is better when compared to 07/21/2018. No pneumothorax. Trachea midline. Atherosclerosis of the aorta. IMPRESSION: Improving CHF pattern compared to the prior exam. Stable cardiomegaly, residual basilar interstitial edema and small left effusion. Electronically Signed   By: Jerilynn Mages.  Shick M.D.   On: 07/30/2018 14:53   Dg Abd 2 Views  Result Date: 07/31/2018 CLINICAL DATA:   Vomiting and constipation. Status post thoracentesis October 2019 EXAM: ABDOMEN - 2 VIEW COMPARISON:  None. FINDINGS: The bowel gas pattern is normal. No radiographic findings of significant retained large bowel stool. Status post cardiac valve replacement. Small LEFT pleural effusion. IMPRESSION: Normal bowel gas pattern. Electronically Signed   By: Elon Alas M.D.   On: 07/31/2018 03:37    Procedures Procedures (including critical care time)  Medications Ordered in ED Medications - No data to display   Initial Impression / Assessment and Plan / ED Course  I have reviewed the triage vital signs and the nursing notes.  Pertinent labs & imaging results that were available during my care of the patient were reviewed by me and considered in my medical decision making (see chart for details).     62 year old male presents to the emergency department for evaluation of vomiting and constipation.  Reports not having a bowel movement since Saturday.  He did take a laxative prior to arrival.  He had a bowel movement in the ED. Patient with no complaints of abdominal pain or nausea during my assessment.  He has had no visualized emesis.  His abdomen is soft, nontender.  Laboratory work-up consistent with baseline.  He had an x-ray performed which shows a nonobstructive bowel gas pattern.  Given resolution of symptoms with laxative, suspect constipation.  Doubt bowel obstruction.  Continue with outpatient primary care follow-up.  Of note, the patient was seen earlier today for complaints of chest pain.  He has no complaints of chest pain at this time.  Return precautions discussed and provided. Patient discharged in stable condition with no unaddressed concerns.   Final Clinical Impressions(s) / ED Diagnoses   Final diagnoses:  Abdominal pain  Constipation, unspecified constipation type  ED Discharge Orders    None       Antonietta Breach, PA-C 07/31/18 0510    Orpah Greek,  MD 07/31/18 [phone]

## 2018-07-31 NOTE — ED Triage Notes (Signed)
Pt comes via [facility] for vomiting and constipation. Seen here once today, reports not having a BM since Saturday.

## 2018-08-01 DIAGNOSIS — D509 Iron deficiency anemia, unspecified: Secondary | ICD-10-CM | POA: Diagnosis not present

## 2018-08-01 DIAGNOSIS — E1129 Type 2 diabetes mellitus with other diabetic kidney complication: Secondary | ICD-10-CM | POA: Diagnosis not present

## 2018-08-01 DIAGNOSIS — Z23 Encounter for immunization: Secondary | ICD-10-CM | POA: Diagnosis not present

## 2018-08-01 DIAGNOSIS — D631 Anemia in chronic kidney disease: Secondary | ICD-10-CM | POA: Diagnosis not present

## 2018-08-01 DIAGNOSIS — N2581 Secondary hyperparathyroidism of renal origin: Secondary | ICD-10-CM | POA: Diagnosis not present

## 2018-08-01 DIAGNOSIS — N186 End stage renal disease: Secondary | ICD-10-CM | POA: Diagnosis not present

## 2018-08-04 ENCOUNTER — Emergency Department (HOSPITAL_COMMUNITY): Payer: Medicare Other

## 2018-08-04 ENCOUNTER — Encounter (HOSPITAL_COMMUNITY): Payer: Self-pay | Admitting: Emergency Medicine

## 2018-08-04 ENCOUNTER — Other Ambulatory Visit: Payer: Self-pay

## 2018-08-04 ENCOUNTER — Inpatient Hospital Stay (HOSPITAL_COMMUNITY)
Admission: EM | Admit: 2018-08-04 | Discharge: 2018-08-05 | DRG: 189 | Disposition: A | Discharge status: Home / Self Care | Payer: Medicare Other | Attending: Internal Medicine | Admitting: Internal Medicine

## 2018-08-04 DIAGNOSIS — Z951 Presence of aortocoronary bypass graft: Secondary | ICD-10-CM | POA: Diagnosis not present

## 2018-08-04 DIAGNOSIS — R0789 Other chest pain: Secondary | ICD-10-CM

## 2018-08-04 DIAGNOSIS — E1140 Type 2 diabetes mellitus with diabetic neuropathy, unspecified: Secondary | ICD-10-CM | POA: Diagnosis present

## 2018-08-04 DIAGNOSIS — D649 Anemia, unspecified: Secondary | ICD-10-CM

## 2018-08-04 DIAGNOSIS — D631 Anemia in chronic kidney disease: Secondary | ICD-10-CM | POA: Diagnosis not present

## 2018-08-04 DIAGNOSIS — R079 Chest pain, unspecified: Secondary | ICD-10-CM | POA: Diagnosis not present

## 2018-08-04 DIAGNOSIS — Z9889 Other specified postprocedural states: Secondary | ICD-10-CM

## 2018-08-04 DIAGNOSIS — Z87891 Personal history of nicotine dependence: Secondary | ICD-10-CM

## 2018-08-04 DIAGNOSIS — J810 Acute pulmonary edema: Secondary | ICD-10-CM | POA: Diagnosis not present

## 2018-08-04 DIAGNOSIS — Z91040 Latex allergy status: Secondary | ICD-10-CM

## 2018-08-04 DIAGNOSIS — Z9981 Dependence on supplemental oxygen: Secondary | ICD-10-CM

## 2018-08-04 DIAGNOSIS — E11319 Type 2 diabetes mellitus with unspecified diabetic retinopathy without macular edema: Secondary | ICD-10-CM | POA: Diagnosis present

## 2018-08-04 DIAGNOSIS — E8889 Other specified metabolic disorders: Secondary | ICD-10-CM | POA: Diagnosis present

## 2018-08-04 DIAGNOSIS — R1110 Vomiting, unspecified: Secondary | ICD-10-CM

## 2018-08-04 DIAGNOSIS — E11649 Type 2 diabetes mellitus with hypoglycemia without coma: Secondary | ICD-10-CM | POA: Diagnosis present

## 2018-08-04 DIAGNOSIS — E1122 Type 2 diabetes mellitus with diabetic chronic kidney disease: Secondary | ICD-10-CM | POA: Diagnosis present

## 2018-08-04 DIAGNOSIS — I5032 Chronic diastolic (congestive) heart failure: Secondary | ICD-10-CM | POA: Diagnosis present

## 2018-08-04 DIAGNOSIS — N186 End stage renal disease: Secondary | ICD-10-CM | POA: Diagnosis present

## 2018-08-04 DIAGNOSIS — R011 Cardiac murmur, unspecified: Secondary | ICD-10-CM | POA: Diagnosis not present

## 2018-08-04 DIAGNOSIS — Z87442 Personal history of urinary calculi: Secondary | ICD-10-CM

## 2018-08-04 DIAGNOSIS — Z79899 Other long term (current) drug therapy: Secondary | ICD-10-CM | POA: Diagnosis not present

## 2018-08-04 DIAGNOSIS — I2510 Atherosclerotic heart disease of native coronary artery without angina pectoris: Secondary | ICD-10-CM | POA: Diagnosis present

## 2018-08-04 DIAGNOSIS — J90 Pleural effusion, not elsewhere classified: Secondary | ICD-10-CM | POA: Diagnosis present

## 2018-08-04 DIAGNOSIS — Z8719 Personal history of other diseases of the digestive system: Secondary | ICD-10-CM

## 2018-08-04 DIAGNOSIS — I132 Hypertensive heart and chronic kidney disease with heart failure and with stage 5 chronic kidney disease, or end stage renal disease: Secondary | ICD-10-CM | POA: Diagnosis present

## 2018-08-04 DIAGNOSIS — E1165 Type 2 diabetes mellitus with hyperglycemia: Secondary | ICD-10-CM | POA: Diagnosis not present

## 2018-08-04 DIAGNOSIS — E1151 Type 2 diabetes mellitus with diabetic peripheral angiopathy without gangrene: Secondary | ICD-10-CM | POA: Diagnosis present

## 2018-08-04 DIAGNOSIS — Z7982 Long term (current) use of aspirin: Secondary | ICD-10-CM

## 2018-08-04 DIAGNOSIS — Z8249 Family history of ischemic heart disease and other diseases of the circulatory system: Secondary | ICD-10-CM

## 2018-08-04 DIAGNOSIS — D696 Thrombocytopenia, unspecified: Secondary | ICD-10-CM | POA: Diagnosis present

## 2018-08-04 DIAGNOSIS — I272 Pulmonary hypertension, unspecified: Secondary | ICD-10-CM | POA: Diagnosis present

## 2018-08-04 DIAGNOSIS — G2581 Restless legs syndrome: Secondary | ICD-10-CM | POA: Diagnosis present

## 2018-08-04 DIAGNOSIS — I120 Hypertensive chronic kidney disease with stage 5 chronic kidney disease or end stage renal disease: Secondary | ICD-10-CM | POA: Diagnosis not present

## 2018-08-04 DIAGNOSIS — Z955 Presence of coronary angioplasty implant and graft: Secondary | ICD-10-CM

## 2018-08-04 DIAGNOSIS — E785 Hyperlipidemia, unspecified: Secondary | ICD-10-CM | POA: Diagnosis present

## 2018-08-04 DIAGNOSIS — Z992 Dependence on renal dialysis: Secondary | ICD-10-CM | POA: Diagnosis not present

## 2018-08-04 DIAGNOSIS — Z8673 Personal history of transient ischemic attack (TIA), and cerebral infarction without residual deficits: Secondary | ICD-10-CM

## 2018-08-04 DIAGNOSIS — Z888 Allergy status to other drugs, medicaments and biological substances status: Secondary | ICD-10-CM

## 2018-08-04 DIAGNOSIS — I501 Left ventricular failure: Secondary | ICD-10-CM | POA: Diagnosis present

## 2018-08-04 DIAGNOSIS — Z91048 Other nonmedicinal substance allergy status: Secondary | ICD-10-CM

## 2018-08-04 DIAGNOSIS — K219 Gastro-esophageal reflux disease without esophagitis: Secondary | ICD-10-CM | POA: Diagnosis not present

## 2018-08-04 DIAGNOSIS — F17290 Nicotine dependence, other tobacco product, uncomplicated: Secondary | ICD-10-CM | POA: Diagnosis not present

## 2018-08-04 DIAGNOSIS — K449 Diaphragmatic hernia without obstruction or gangrene: Secondary | ICD-10-CM | POA: Diagnosis not present

## 2018-08-04 DIAGNOSIS — Z952 Presence of prosthetic heart valve: Secondary | ICD-10-CM

## 2018-08-04 DIAGNOSIS — I5030 Unspecified diastolic (congestive) heart failure: Secondary | ICD-10-CM | POA: Diagnosis not present

## 2018-08-04 DIAGNOSIS — N2581 Secondary hyperparathyroidism of renal origin: Secondary | ICD-10-CM | POA: Diagnosis not present

## 2018-08-04 DIAGNOSIS — Z833 Family history of diabetes mellitus: Secondary | ICD-10-CM

## 2018-08-04 LAB — I-STAT TROPONIN, ED: Troponin i, poc: 0.02 ng/mL (ref 0.00–0.08)

## 2018-08-04 LAB — CBG MONITORING, ED
GLUCOSE-CAPILLARY: 106 mg/dL — AB (ref 70–99)
Glucose-Capillary: 63 mg/dL — ABNORMAL LOW (ref 70–99)

## 2018-08-04 LAB — CBC
HCT: 31.9 % — ABNORMAL LOW (ref 39.0–52.0)
HCT: 31.7 % — ABNORMAL LOW (ref 39.0–52.0)
HEMOGLOBIN: 9.8 g/dL — AB (ref 13.0–17.0)
Hemoglobin: 9.6 g/dL — ABNORMAL LOW (ref 13.0–17.0)
MCH: 24.7 pg — ABNORMAL LOW (ref 26.0–34.0)
MCH: 24.4 pg — ABNORMAL LOW (ref 26.0–34.0)
MCHC: 30.7 g/dL (ref 30.0–36.0)
MCHC: 30.3 g/dL (ref 30.0–36.0)
MCV: 80.4 fL (ref 80.0–100.0)
MCV: 80.5 fL (ref 80.0–100.0)
NRBC: 0 % (ref 0.0–0.2)
PLATELETS: 238 10*3/uL (ref 150–400)
Platelets: 236 10*3/uL (ref 150–400)
RBC: 3.97 MIL/uL — AB (ref 4.22–5.81)
RBC: 3.94 MIL/uL — AB (ref 4.22–5.81)
RDW: 18.1 % — ABNORMAL HIGH (ref 11.5–15.5)
RDW: 18 % — ABNORMAL HIGH (ref 11.5–15.5)
WBC: 9.8 10*3/uL (ref 4.0–10.5)
WBC: 8.6 10*3/uL (ref 4.0–10.5)
nRBC: 0 % (ref 0.0–0.2)

## 2018-08-04 LAB — COMPREHENSIVE METABOLIC PANEL
ALK PHOS: 57 U/L (ref 38–126)
ALT: 9 U/L (ref 0–44)
AST: 8 U/L — ABNORMAL LOW (ref 15–41)
Albumin: 3.3 g/dL — ABNORMAL LOW (ref 3.5–5.0)
Anion gap: 14 (ref 5–15)
BILIRUBIN TOTAL: 0.9 mg/dL (ref 0.3–1.2)
BUN: 52 mg/dL — ABNORMAL HIGH (ref 6–20)
CALCIUM: 9 mg/dL (ref 8.9–10.3)
CO2: 28 mmol/L (ref 22–32)
Chloride: 92 mmol/L — ABNORMAL LOW (ref 98–111)
Creatinine, Ser: 11.45 mg/dL — ABNORMAL HIGH (ref 0.61–1.24)
GFR calc non Af Amer: 4 mL/min — ABNORMAL LOW (ref >60)
GFR, EST AFRICAN AMERICAN: 5 mL/min — AB (ref >60)
Glucose, Bld: 120 mg/dL — ABNORMAL HIGH (ref 70–99)
Potassium: 5.6 mmol/L — ABNORMAL HIGH (ref 3.5–5.1)
SODIUM: 134 mmol/L — AB (ref 135–145)
TOTAL PROTEIN: 7.6 g/dL (ref 6.5–8.1)

## 2018-08-04 LAB — BASIC METABOLIC PANEL
ANION GAP: 13 (ref 5–15)
BUN: 44 mg/dL — AB (ref 6–20)
CO2: 28 mmol/L (ref 22–32)
Calcium: 9.1 mg/dL (ref 8.9–10.3)
Chloride: 92 mmol/L — ABNORMAL LOW (ref 98–111)
Creatinine, Ser: 10.07 mg/dL — ABNORMAL HIGH (ref 0.61–1.24)
GFR calc Af Amer: 6 mL/min — ABNORMAL LOW (ref >60)
GFR calc non Af Amer: 5 mL/min — ABNORMAL LOW (ref >60)
Glucose, Bld: 105 mg/dL — ABNORMAL HIGH (ref 70–99)
POTASSIUM: 5 mmol/L (ref 3.5–5.1)
Sodium: 133 mmol/L — ABNORMAL LOW (ref 135–145)

## 2018-08-04 LAB — TROPONIN I
Troponin I: 0.03 ng/mL (ref <0.03)
Troponin I: 0.03 ng/mL (ref <0.03)

## 2018-08-04 LAB — GLUCOSE, CAPILLARY
GLUCOSE-CAPILLARY: 76 mg/dL (ref 70–99)
GLUCOSE-CAPILLARY: 86 mg/dL (ref 70–99)

## 2018-08-04 MED ORDER — ISOSORBIDE MONONITRATE ER 30 MG PO TB24
30.0000 mg | ORAL_TABLET | Freq: Every day | ORAL | Status: DC
  Administered 2018-08-05: 30 mg via ORAL
  Filled 2018-08-04: qty 1

## 2018-08-04 MED ORDER — CALCITRIOL 0.5 MCG PO CAPS: 0.5000 ug | ORAL_CAPSULE | ORAL | Status: DC

## 2018-08-04 MED ORDER — AMLODIPINE BESYLATE 10 MG PO TABS
10.0000 mg | ORAL_TABLET | Freq: Every day | ORAL | Status: DC
  Administered 2018-08-04: 10 mg via ORAL
  Filled 2018-08-04: qty 1

## 2018-08-04 MED ORDER — SEVELAMER CARBONATE 800 MG PO TABS
4000.0000 mg | ORAL_TABLET | Freq: Three times a day (TID) | ORAL | Status: DC
  Administered 2018-08-04 – 2018-08-05 (×3): 4000 mg via ORAL
  Filled 2018-08-04 (×3): qty 5

## 2018-08-04 MED ORDER — LIDOCAINE 5 % EX PTCH
1.0000 | MEDICATED_PATCH | CUTANEOUS | Status: DC
  Administered 2018-08-04: 1 via TRANSDERMAL
  Filled 2018-08-04: qty 1

## 2018-08-04 MED ORDER — SENNA 8.6 MG PO TABS
1.0000 | ORAL_TABLET | Freq: Two times a day (BID) | ORAL | Status: DC
  Administered 2018-08-04 – 2018-08-05 (×2): 8.6 mg via ORAL
  Filled 2018-08-04 (×2): qty 1

## 2018-08-04 MED ORDER — SODIUM CHLORIDE 0.9 % IV SOLN: 100.0000 mL | INTRAVENOUS | Status: DC | PRN

## 2018-08-04 MED ORDER — FUROSEMIDE 10 MG/ML IJ SOLN
40.0000 mg | Freq: Once | INTRAMUSCULAR | Status: AC
  Administered 2018-08-04: 40 mg via INTRAVENOUS
  Filled 2018-08-04: qty 4

## 2018-08-04 MED ORDER — ASPIRIN EC 81 MG PO TBEC
81.0000 mg | DELAYED_RELEASE_TABLET | Freq: Every day | ORAL | Status: DC
  Administered 2018-08-05: 81 mg via ORAL
  Filled 2018-08-04: qty 1

## 2018-08-04 MED ORDER — HEPARIN SODIUM (PORCINE) 5000 UNIT/ML IJ SOLN
5000.0000 [IU] | Freq: Three times a day (TID) | INTRAMUSCULAR | Status: DC
  Administered 2018-08-04 – 2018-08-05 (×2): 5000 [IU] via SUBCUTANEOUS
  Filled 2018-08-04: qty 1

## 2018-08-04 MED ORDER — HEPARIN SODIUM (PORCINE) 1000 UNIT/ML DIALYSIS: 1000.0000 [IU] | INTRAMUSCULAR | Status: DC | PRN

## 2018-08-04 MED ORDER — ATORVASTATIN CALCIUM 20 MG PO TABS
20.0000 mg | ORAL_TABLET | Freq: Every day | ORAL | Status: DC
  Administered 2018-08-04: 20 mg via ORAL
  Filled 2018-08-04: qty 1

## 2018-08-04 MED ORDER — SEVELAMER CARBONATE 800 MG PO TABS
3200.0000 mg | ORAL_TABLET | Freq: Three times a day (TID) | ORAL | Status: DC
  Filled 2018-08-04 (×3): qty 4

## 2018-08-04 MED ORDER — ALTEPLASE 2 MG IJ SOLR: 2.0000 mg | Freq: Once | INTRAMUSCULAR | Status: DC | PRN

## 2018-08-04 MED ORDER — ROPINIROLE HCL 0.25 MG PO TABS
0.2500 mg | ORAL_TABLET | Freq: Every day | ORAL | Status: DC
  Administered 2018-08-04: 0.25 mg via ORAL
  Filled 2018-08-04 (×2): qty 1

## 2018-08-04 MED ORDER — CHLORHEXIDINE GLUCONATE CLOTH 2 % EX PADS: 6.0000 | MEDICATED_PAD | Freq: Every day | CUTANEOUS | Status: DC

## 2018-08-04 MED ORDER — POLYETHYLENE GLYCOL 3350 17 G PO PACK: 17.0000 g | PACK | Freq: Every day | ORAL | Status: DC | PRN

## 2018-08-04 MED ORDER — ASPIRIN 81 MG PO CHEW
324.0000 mg | CHEWABLE_TABLET | Freq: Once | ORAL | Status: AC
  Administered 2018-08-04: 324 mg via ORAL
  Filled 2018-08-04: qty 4

## 2018-08-04 MED ORDER — LIDOCAINE-PRILOCAINE 2.5-2.5 % EX CREA: 1.0000 "application " | TOPICAL_CREAM | CUTANEOUS | Status: DC | PRN

## 2018-08-04 MED ORDER — PENTAFLUOROPROP-TETRAFLUOROETH EX AERO: 1.0000 "application " | INHALATION_SPRAY | CUTANEOUS | Status: DC | PRN

## 2018-08-04 MED ORDER — LISINOPRIL 10 MG PO TABS
10.0000 mg | ORAL_TABLET | Freq: Every day | ORAL | Status: DC
  Administered 2018-08-05: 10 mg via ORAL
  Filled 2018-08-04: qty 1

## 2018-08-04 MED ORDER — NITROGLYCERIN 2 % TD OINT
1.0000 [in_us] | TOPICAL_OINTMENT | Freq: Once | TRANSDERMAL | Status: AC
  Administered 2018-08-04: 1 [in_us] via TOPICAL
  Filled 2018-08-04: qty 1

## 2018-08-04 MED ORDER — CINACALCET HCL 30 MG PO TABS
180.0000 mg | ORAL_TABLET | ORAL | Status: DC
  Administered 2018-08-04: 180 mg via ORAL
  Filled 2018-08-04: qty 6

## 2018-08-04 MED ORDER — RENA-VITE PO TABS
1.0000 | ORAL_TABLET | Freq: Every day | ORAL | Status: DC
  Administered 2018-08-04: 1 via ORAL
  Filled 2018-08-04: qty 1

## 2018-08-04 MED ORDER — LIDOCAINE HCL (PF) 1 % IJ SOLN: 5.0000 mL | INTRAMUSCULAR | Status: DC | PRN

## 2018-08-04 MED ORDER — ACETAMINOPHEN 650 MG RE SUPP: 650.0000 mg | Freq: Four times a day (QID) | RECTAL | Status: DC | PRN

## 2018-08-04 MED ORDER — ACETAMINOPHEN 325 MG PO TABS
650.0000 mg | ORAL_TABLET | Freq: Four times a day (QID) | ORAL | Status: DC | PRN
  Administered 2018-08-04: 650 mg via ORAL
  Filled 2018-08-04: qty 2

## 2018-08-04 NOTE — Consult Note (Addendum)
[facility] Renal Consultation Note    Indication for Consultation:  Management of ESRD/hemodialysis; anemia, hypertension/volume and secondary hyperparathyroidism PCP: Dr. Neva Seat  HPI: [name] [name] is a 62 y.o. male with ESRD on hemodialysis MWF at [facility]. PMH DM, HTN, CAD with PTCI to LAD 2009. H/O severe MR S/P CABG/MVR at [facility] 12/19/2017, HFpEF (EF 60-65% 07/2018),HLD, AOCD, SHPT. Last HD 08/01/18 ran full treatment, left 0.7 kg under EDW.   Patient presented to ED with C/O substernal chest pain 8/10 without radiation with associated N,V, diaphoresis. He localizes chest pain to upper epigastric area. He presented to ED for evaluation. Upon arrival to ED, K+ 5.0 SCr 10 HGB 9.5 WBC 9.5 Troponin 0.03 negative with flat trend. EKG-SR with prolonged QTc. CXR showed mild vascular congestion with small bilateral pleural effusions. He has been admitted by primary for chest pain/volume overload.   Past Medical History:  Diagnosis Date  . Anemia   . Anginal pain ([place])   . CAD (coronary artery disease)    a. per [facility] s/p 3.51mm x 35mm Vision BMS to mid LAD 12/2009 and Xience DES to mid LAD 10/2010.  [name] Chronic diastolic CHF (congestive heart failure) ([place])   . Colon polyps   . Daily headache   . ESRD on dialysis [facility]) since ~ 2008   "MWF; Jene[facility] Rinks" (03/04/2017)  . GERD (gastroesophageal reflux disease)   . H/O TIA (transient ischemic attack) 04/01/2015  . H/O TIA (transient ischemic attack) 04/01/2015  . Heart murmur   . Hematochezia    a. 2014: colonscopy, which showed moderately-sized internal hemorrhoids, two 71mm polyps in transverse colon and ascending colon that were resected, five 2-73mm polyps in sigmoid colon, descending colon, transverse colon, and ascending colon that were resected. An upper endoscopy was performed and showed normal esophagus, stomach, and duodenum.  . Hematuria    a. H/o hematuria 2014 with cystoscopy that was  unrevealing for his source of hematuria. He underwent a kidney ultrasound on 10/14 that showed mildly echogenic and scarred kidneys compatible with medical renal disease, without hydronephrosis or renal calculi.  [name] History of blood transfusion    "had colonoscopy done; they had to give me some blood"  . History of kidney stones   . Hyperlipidemia   . Hypertension   . On home oxygen therapy    "2L prn" (07/21/2015); "been off it for awhile" (03/04/2017)  . Pneumonia   . Renal insufficiency   . 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 Type II diabetes mellitus ([place])   . Wears dentures    Past Surgical History:  Procedure Laterality Date  . ABDOMINAL AORTOGRAM W/LOWER EXTREMITY N/A 05/29/2018   Procedure: ABDOMINAL AORTOGRAM W/LOWER EXTREMITY;  Surgeon: Lorretta Harp, MD;  Location: [place] CV LAB;  Service: Cardiovascular;  Laterality: N/A;  . AV FISTULA PLACEMENT Left ~ 2007   "upper arm"  . CARDIAC CATHETERIZATION  "several"  . CORONARY ANGIOPLASTY WITH STENT PLACEMENT  "several"  . CORONARY ARTERY BYPASS GRAFT     3 grafts  . CYSTOSCOPY W/ STONE MANIPULATION  X2?  . EYE SURGERY Bilateral    "laser OR for hemorrhage"  . IR THORACENTESIS ASP PLEURAL SPACE W/IMG GUIDE  07/09/2018  . LEFT HEART CATHETERIZATION WITH CORONARY ANGIOGRAM N/A 11/23/2014   Procedure: LEFT HEART CATHETERIZATION WITH CORONARY ANGIOGRAM;  Surgeon: Troy Sine, MD;  Location: [facility] CATH LAB;  Service: Cardiovascular;  Laterality: N/A;  .  LITHOTRIPSY  X1  . MITRAL VALVE REPLACEMENT    . REVISON OF ARTERIOVENOUS FISTULA Left 53/61/4431   Procedure: PLICATION OF LEFT ARM ARTERIOVENOUS FISTULA;  Surgeon: Angelia Mould, MD;  Location: [place];  Service: Vascular;  Laterality: Left;  . REVISON OF ARTERIOVENOUS FISTULA Left 5/40/0867   Procedure: PLICATE ANEURYSM  OF LEFT ARTERIOVENOUS FISTULA;  Surgeon: Waynetta Sandy, MD;  Location: [place];  Service: Vascular;   Laterality: Left;  . TEE WITHOUT CARDIOVERSION N/A 07/23/2018   Procedure: TRANSESOPHAGEAL ECHOCARDIOGRAM (TEE);  Surgeon: Sueanne Margarita, MD;  Location: [facility] ENDOSCOPY;  Service: Cardiovascular;  Laterality: N/A;   Family History  Problem Relation Age of Onset  . Bone cancer Mother   . Anuerysm Father   . Hypertension Unknown   . Diabetes type II Daughter   . Ovarian cancer Sister    Social History:  reports that he has quit smoking. His smoking use included cigars. He has a 2.40 pack-year smoking history. He has never used smokeless tobacco. He reports that he does not drink alcohol or use drugs. Allergies  Allergen Reactions  . Enalapril Hives and Rash  . Latex Rash  . Tape Rash and Other (See Comments)    TAPE MAKES SKIN BREAK OUT AND TURN RED   Prior to Admission medications   Medication Sig Start Date End Date Taking? Authorizing Provider  albuterol (PROVENTIL HFA;VENTOLIN HFA) 108 (90 Base) MCG/ACT inhaler Inhale 2 puffs into the lungs every 6 (six) hours as needed for wheezing or shortness of breath. 06/07/18  Yes Neva Seat, MD  amLODipine (NORVASC) 10 MG tablet Take 1 tablet (10 mg total) by mouth at bedtime. 04/17/18  Yes Neva Seat, MD  aspirin EC 81 MG tablet Take 81 mg by mouth daily.   Yes [provider]  atorvastatin (LIPITOR) 20 MG tablet Take 1 tablet (20 mg total) by mouth at bedtime. 07/31/18  Yes Neva Seat, MD  isosorbide mononitrate (IMDUR) 30 MG 24 hr tablet Take 1 tablet (30 mg total) by mouth daily. 07/10/18 07/10/19 Yes Neva Seat, MD  lisinopril (PRINIVIL,ZESTRIL) 10 MG tablet Take 1 tablet (10 mg total) by mouth daily. 07/10/18  Yes Neva Seat, MD  multivitamin (RENA-VIT) TABS tablet TAKE 1 TABLET BY MOUTH ONCE DAILY AT BEDTIME Patient taking differently: Take 1 tablet by mouth daily.  07/02/18  Yes Neva Seat, MD  nitroGLYCERIN (NITROSTAT) 0.4 MG SL tablet Place 1 tablet (0.4 mg total) under the tongue every 5  (five) minutes as needed for chest pain. 07/10/18  Yes Neva Seat, MD  ondansetron (ZOFRAN) 4 MG tablet Take 1 tablet (4 mg total) by mouth 4 (four) times daily as needed for nausea or vomiting. 03/04/18  Yes Wynona Luna, MD  rOPINIRole (REQUIP) 0.25 MG tablet Take 1 tablet (0.25 mg total) by mouth at bedtime. 02/25/18  Yes Bartholomew Crews, MD  sevelamer carbonate (RENVELA) 800 MG tablet Take 4 tablets (3,200 mg total) by mouth 3 (three) times daily with meals. Patient taking differently: Take 1,600-3,200 mg by mouth See admin instructions. Take 4 tablets (3200 mg) by mouth three times daily with meals and 2 tablets (1600 mg) with snacks 03/12/16  Yes Vann, Jessica U, DO  traMADol (ULTRAM) 50 MG tablet Take 50 mg by mouth every 6 (six) hours as needed for moderate pain.   Yes [provider]  ACCU-CHEK FASTCLIX LANCETS MISC Check up to seven times per week 07/24/18   Seawell, Jaimie A, DO  cinacalcet (SENSIPAR) 90  MG tablet Take 180 mg by mouth every Monday, Wednesday, and Friday with hemodialysis.     [provider]  glucose blood (ACCU-CHEK GUIDE) test strip Check up to seven times per week. 07/24/18   Seawell, Jaimie A, DO   Current Facility-Administered Medications  Medication Dose Route Frequency Provider Last Rate Last Dose  . acetaminophen (TYLENOL) tablet 650 mg  650 mg Oral Q6H PRN Alphonzo Grieve, MD       Or  . acetaminophen (TYLENOL) suppository 650 mg  650 mg Rectal Q6H PRN Alphonzo Grieve, MD      . amLODipine (NORVASC) tablet 10 mg  10 mg Oral QHS Alphonzo Grieve, MD      . Derrill Memo ON 08/05/2018] aspirin EC tablet 81 mg  81 mg Oral Daily Svalina, Estill Dooms, MD      . atorvastatin (LIPITOR) tablet 20 mg  20 mg Oral QHS Alphonzo Grieve, MD      . Derrill Memo ON 08/05/2018] Chlorhexidine Gluconate Cloth 2 % PADS 6 each  6 each Topical Q0600 Edrick Oh, MD      . cinacalcet (SENSIPAR) tablet 180 mg  180 mg Oral Q M,W,F-HD Alphonzo Grieve, MD      . heparin  injection 5,000 Units  5,000 Units Subcutaneous Q8H Svalina, Gorica, MD      . isosorbide mononitrate (IMDUR) 24 hr tablet 30 mg  30 mg Oral Daily Alphonzo Grieve, MD   Stopped at 08/04/18 1210  . lisinopril (PRINIVIL,ZESTRIL) tablet 10 mg  10 mg Oral Daily Alphonzo Grieve, MD   Stopped at 08/04/18 1211  . multivitamin (RENA-VIT) tablet 1 tablet  1 tablet Oral QHS Svalina, Gorica, MD      . polyethylene glycol (MIRALAX / GLYCOLAX) packet 17 g  17 g Oral Daily PRN Alphonzo Grieve, MD      . rOPINIRole (REQUIP) tablet 0.25 mg  0.25 mg Oral QHS Alphonzo Grieve, MD      . senna (SENOKOT) tablet 8.6 mg  1 tablet Oral BID Alphonzo Grieve, MD      . sevelamer carbonate (RENVELA) tablet 3,200 mg  3,200 mg Oral TID WC Alphonzo Grieve, MD   Stopped at 08/04/18 1355   Current Outpatient Medications  Medication Sig Dispense Refill  . albuterol (PROVENTIL HFA;VENTOLIN HFA) 108 (90 Base) MCG/ACT inhaler Inhale 2 puffs into the lungs every 6 (six) hours as needed for wheezing or shortness of breath. 1 Inhaler 2  . amLODipine (NORVASC) 10 MG tablet Take 1 tablet (10 mg total) by mouth at bedtime. 30 tablet 11  . aspirin EC 81 MG tablet Take 81 mg by mouth daily.    [name] atorvastatin (LIPITOR) 20 MG tablet Take 1 tablet (20 mg total) by mouth at bedtime. 30 tablet 11  . isosorbide mononitrate (IMDUR) 30 MG 24 hr tablet Take 1 tablet (30 mg total) by mouth daily. 30 tablet 11  . lisinopril (PRINIVIL,ZESTRIL) 10 MG tablet Take 1 tablet (10 mg total) by mouth daily. 30 tablet 3  . multivitamin (RENA-VIT) TABS tablet TAKE 1 TABLET BY MOUTH ONCE DAILY AT BEDTIME (Patient taking differently: Take 1 tablet by mouth daily. ) 90 tablet 0  . nitroGLYCERIN (NITROSTAT) 0.4 MG SL tablet Place 1 tablet (0.4 mg total) under the tongue every 5 (five) minutes as needed for chest pain. 15 tablet 0  . ondansetron (ZOFRAN) 4 MG tablet Take 1 tablet (4 mg total) by mouth 4 (four) times daily as needed for nausea or vomiting. 20 tablet 0   . rOPINIRole (REQUIP)  0.25 MG tablet Take 1 tablet (0.25 mg total) by mouth at bedtime. 30 tablet 5  . sevelamer carbonate (RENVELA) 800 MG tablet Take 4 tablets (3,200 mg total) by mouth 3 (three) times daily with meals. (Patient taking differently: Take 1,600-3,200 mg by mouth See admin instructions. Take 4 tablets (3200 mg) by mouth three times daily with meals and 2 tablets (1600 mg) with snacks) 320 tablet 0  . traMADol (ULTRAM) 50 MG tablet Take 50 mg by mouth every 6 (six) hours as needed for moderate pain.    [name] ACCU-CHEK FASTCLIX LANCETS MISC Check up to seven times per week 102 each 0  . cinacalcet (SENSIPAR) 90 MG tablet Take 180 mg by mouth every Monday, Wednesday, and Friday with hemodialysis.     [name] glucose blood (ACCU-CHEK GUIDE) test strip Check up to seven times per week. 50 each 0   Labs: Basic Metabolic Panel: Recent Labs  Lab 07/30/18 1432 07/31/18 0047 08/04/18 0229  NA 137 135 133*  K 3.9 4.1 5.0  CL 90* 91* 92*  CO2 30 28 28   GLUCOSE 73 90 105*  BUN 14 21* 44*  CREATININE 4.88* 6.46* 10.07*  CALCIUM 8.5* 8.8* 9.1   Liver Function Tests: Recent Labs  Lab 07/31/18 0047  AST 14*  ALT 13  ALKPHOS 62  BILITOT 0.8  PROT 8.9*  ALBUMIN 3.8   Recent Labs  Lab 07/31/18 0047  LIPASE 40   No results for input(s): AMMONIA in the last 168 hours. CBC: Recent Labs  Lab 07/28/18 1856 07/30/18 1432 07/31/18 0047 08/04/18 0229  WBC 7.1 6.8 8.4 9.8  HGB 10.3* 10.3* 10.8* 9.8*  HCT 33.2* 33.6* 36.0* 31.9*  MCV 81.0 79.8* 80.9 80.4  PLT 199 241 250 236   Cardiac Enzymes: Recent Labs  Lab 07/30/18 1943 08/04/18 0640 08/04/18 1140  TROPONINI <0.03 0.03* 0.03*   CBG: Recent Labs  Lab 07/28/18 1844 08/04/18 0630 08/04/18 1121  GLUCAP 99 63* 106*   Iron Studies: No results for input(s): IRON, TIBC, TRANSFERRIN, FERRITIN in the last 72 hours. Studies/Results: Dg Chest 2 View  Result Date: 08/04/2018 CLINICAL DATA:  62 year old male with chest pain  and shortness of breath. EXAM: CHEST - 2 VIEW COMPARISON:  Chest radiograph dated 07/30/2018 FINDINGS: There is mild cardiomegaly with mild vascular congestion and small bilateral pleural effusions. Median sternotomy wires and mechanical cardiac valve. No pneumothorax. No acute osseous pathology. IMPRESSION: Cardiomegaly with findings of CHF, worsened since the prior radiograph. Electronically Signed   By: Anner Crete M.D.   On: 08/04/2018 02:44    ROS: As per HPI otherwise negative.  Review of Systems: Gen: Denies any fever, chills, sweats, anorexia, fatigue, weakness, malaise, weight loss, and sleep disorder HEENT: No visual complaints, No history of Retinopathy. Normal external appearance No Epistaxis or Sore throat. No sinusitis.   CV: Denies chest pain, angina, palpitations, syncope, orthopnea, PND, peripheral edema, and claudication. Resp: Denies dyspnea at rest, dyspnea with exercise, cough, sputum, wheezing, coughing up blood, and pleurisy. GI: Denies vomiting blood, jaundice, and fecal incontinence.   Denies dysphagia or odynophagia. GU : Denies urinary burning, blood in urine, urinary frequency, urinary hesitancy, nocturnal urination, and urinary incontinence.  No renal calculi. MS: Denies joint pain, limitation of movement, and swelling, stiffness, low back pain, extremity pain. Denies muscle weakness, cramps, atrophy.  No use of non steroidal antiinflammatory drugs. Derm: Denies rash, itching, dry skin, hives, moles, warts, or unhealing ulcers.  Psych: Denies depression, anxiety, memory loss, suicidal  ideation, hallucinations, paranoia, and confusion. Heme: Denies bruising, bleeding, and enlarged lymph nodes. Neuro: No headache.  No diplopia. No dysarthria.  No dysphasia.  No history of CVA.  No Seizures. No paresthesias.  No weakness. Endocrine No DM.  No Thyroid disease.  No Adrenal disease.  Physical Exam: Vitals:   08/04/18 0446 08/04/18 0518 08/04/18 0715 08/04/18 1300   BP: (!) 202/91 (!) 171/90 (!) 153/85   Pulse: 98 88 91 92  Resp: (!) 21 20 (!) 22 20  Temp: 98.4 F (36.9 C)     TempSrc: Oral     SpO2: 95% 95% 99% 97%     General: Well developed, well nourished, in no acute distress. Head: Normocephalic, atraumatic, sclera non-icteric, mucus membranes are moist Neck: Supple. JVD 1/3 to mandible. Lungs: Bilateral breath sounds with bibasilar crackles. No wheezing, No WOB.  Heart: RRR with S1 S2. 2/6 systolic M. No rubs, or gallops appreciated. Abdomen: Soft, non-tender, non-distended with normoactive bowel sounds. No rebound/guarding. No obvious abdominal masses. M-S:  Strength and tone appear normal for age. Lower extremities:without edema or ischemic changes, no open wounds  Neuro: Alert and oriented X 3. Moves all extremities spontaneously. Psych:  Responds to questions appropriately with a normal affect. Dialysis Access: LUA AVF aneurysmal, cannulated at present.   Dialysis Orders: G/O MWF 4 hrs 200NRe 500/Autoflow 2.0 85.5 kg 2.0K/2.0 Ca UFP 2 -Heparin 8000 units IV TIW -Sensipar 180 mg PO TIW -Calcitriol 0.5 mcg PO TIW -Mircera 75 mcg IV (last dose 07/30/18 Last HGB 10.2 07/30/18)   Assessment/Plan: 1.  Chest pain-per primary  Recommend serial troponins 2.  Volume overload-does not seem overtly volume overloaded by exam. CXR with mild vascular congestion. Left under OP EDW last tx. Attempting UFG 3.5 liters. Challenge dry weight  3.  ESRD - MWF. HD today on schedule. Plan dialysis today 4.  Hypertension/volume  - As noted above. Hypertensive at beginning of tx, hopefully will come down as treatment progresses. Home meds resumed. agree 5.  Anemia  -HGB 9.8. Recent ESA dose. Follow HGB 6.  Metabolic bone disease -  Continue sensipar, VDRA, Binders.  7.  Nutrition -Renal diet, renal vit, nepro 8.  DM-diet controlled, no OP meds.    I have seen and examined this patient and agree with the plan of care, plan dialysis for volume overload  that is mild and evaluate for ischemic myocardial damage   Sherril Croon 08/04/2018, 5:50 PM   Jimmye Norman. Owens Shark, NP-C 08/04/2018, 2:35 PM  [employer] [phone]

## 2018-08-04 NOTE — H&P (Signed)
Date: 08/04/2018               Patient Name:  [name] [name] MRN: [id number]  DOB: [birth year] Age / Sex: 62 y.o., male   PCP: Neva Seat, MD         Medical Service: Internal Medicine Teaching Service         Attending Physician: Dr. Lucious Groves, DO    First Contact: Dr. Linna Hoff Pager: [phone]  Second Contact: Dr. Vickki Muff Pager: [phone]       After Hours (After 5p/  First Contact Pager: [phone]  weekends / holidays): Second Contact Pager: [phone]   Chief Complaint: Chest Pain  History of Present Illness: JeremiahMartin is a 62 yo M w/ PMH of CAD s/p cabgx2 & mvr in 12/2017, ESRD on MWF HD, anemia, O6VE, GERD, diastolic CHF and PVD presenting with chest pain. He was examined and evaluated in ED hallway. He was in his usual state of health until this morning when he woke up in the middle of the night with sharp, pressure like, substernal chest pain 9/10 with associated diaphoresis, nausea and vomiting with clear emesis. He came to the ED for evaluation because 'he didn't feel right.' and his pain subsided after another episode of vomiting. He also endorse significant chest congestion and cough with white mucus production as well as dyspnea. He states intermittent blurry vision as well as subjective chills. He denies any palpitations, headaches, tremors, or significant lower extremity edema. He states he went to all of his dialysis sessions including a full session on Friday but he states Monday or Wednesday dialysis may have been cut short due to nausea and vomiting.  He also mentions he had a recent ED visit for abdominal pain and he was diagnosed with constipation and given laxatives. He states he has been reporting loss of appetite lately due to the constipation as he feels the laxative is not working as effectively as it could be. He does state he has been having intermittent bowel movements that were runny. He also mentions that his wife was recently sick with viral  gastroenteritis and has had significant diarrhea lately.  In the ED, he was found with some vascular congestion and pleural effusions on chest X-ray. Given asa as well as IV lasix 40mg  and put on 2L O2 [place] for O2 sat of 88.   Meds:  No outpatient medications have been marked as taking for the 08/04/18 encounter [facility] Encounter).   Allergies: Allergies as of 08/04/2018 - Review Complete 07/31/2018  Allergen Reaction Noted  . Enalapril Hives and Rash 04/26/2014  . Latex Rash 09/23/2016  . Tape Rash and Other (See Comments) 09/23/2016   Past Medical History:  Diagnosis Date  . Anemia   . Anginal pain ([place])   . CAD (coronary artery disease)    a. per [facility] s/p 3.4mm x 36mm Vision BMS to mid LAD 12/2009 and Xience DES to mid LAD 10/2010.  [name] Chronic diastolic CHF (congestive heart failure) ([place])   . Colon polyps   . Daily headache   . ESRD on dialysis [facility]) since ~ 2008   "MWF; Jeneen Rinks" (03/04/2017)  . GERD (gastroesophageal reflux disease)   . H/O TIA (transient ischemic attack) 04/01/2015  . H/O TIA (transient ischemic attack) 04/01/2015  . Heart murmur   . Hematochezia    a. 2014: colonscopy, which showed moderately-sized internal hemorrhoids, two 85mm polyps in transverse colon and ascending colon that were resected, five 2-25mm  polyps in sigmoid colon, descending colon, transverse colon, and ascending colon that were resected. An upper endoscopy was performed and showed normal esophagus, stomach, and duodenum.  . Hematuria    a. H/o hematuria 2014 with cystoscopy that was unrevealing for his source of hematuria. He underwent a kidney ultrasound on 10/14 that showed mildly echogenic and scarred kidneys compatible with medical renal disease, without hydronephrosis or renal calculi.  [name] History of blood transfusion    "had colonoscopy done; they had to give me some blood"  . History of kidney stones   . Hyperlipidemia   . Hypertension   . On home oxygen therapy    "2L prn"  (07/21/2015); "been off it for awhile" (03/04/2017)  . Pneumonia   . Renal insufficiency   . 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 Type II diabetes mellitus ([place])   . Wears dentures     Family History: Family History  Problem Relation Age of Onset  . Bone cancer Mother   . Anuerysm Father   . Hypertension Unknown   . Diabetes type II Daughter   . Ovarian cancer Sister    Social History:  Lives at home with wife. Denies any alcohol, illicit substance use. Continues smoking cigars with last one being earlier yesterday evening.  Review of Systems: A complete ROS was negative except as per HPI.  Physical Exam: Blood pressure (!) 171/90, pulse 88, temperature 98.4 F (36.9 C), temperature source Oral, resp. rate 20, SpO2 95 %. Physical Exam  Constitutional: He is oriented to person, place, and time and well-developed, well-nourished, and in no distress. No distress.  HENT:  Head: Normocephalic and atraumatic.  Mouth/Throat: Oropharynx is clear and moist. No oropharyngeal exudate.  Eyes: Pupils are equal, round, and reactive to light. Conjunctivae and EOM are normal. No scleral icterus.  Neck: Normal range of motion. Neck supple. JVD present.  Cardiovascular: Normal rate, regular rhythm, normal heart sounds and intact distal pulses.  No murmur heard. Pulmonary/Chest: Effort normal. He has rales (bilateral crackles up to 2/3 thorax).  Abdominal: Soft. Bowel sounds are normal. He exhibits no distension. There is no tenderness.  Musculoskeletal: Normal range of motion. He exhibits no edema or tenderness.  Lymphadenopathy:    He has no cervical adenopathy.  Neurological: He is alert and oriented to person, place, and time. GCS score is 15.  Skin: Skin is warm and dry. He is not diaphoretic.  Psychiatric: Mood, memory, affect and judgment normal.   EKG: personally reviewed my interpretation is normal sinus, normal axis, no ST elevation or depression, no T  wave inversions, no significant change from prior.  CXR: personally reviewed my interpretation is normal mediastinum, no lobar consolidation, significantly increased vascular congestion compared to prior chest X-ray, left sided pleural effusion. Questionable hiatal hernia.  Assessment & Plan by Problem: JeremiahClock is a 62 yo M w/ PMH of CAD s/p cabgx2 & mvr in 12/2017, ESRD on MWF HD, anemia, Y6RS, GERD, diastolic CHF and PVD presenting with atypical chest pain. He recently had a TEE for evaluation of his mitral valve and since then he appears to have recurrent chest pain associated with nausea and vomiting. He has heart score of 3 but his presentation and relief with vomiting makes GI source of pain more likely however he has significant pulmonary edema on exam and X-ray with new oxygen requirement. He is scheduled for dialysis session at this time and his weight is up from  85kg on Friday to 89.4kg today. Most likely fluid overloaded due to being close to his dialysis session. He will be admitted for inpatient dialysis to take off fluids and monitor his respiratory status and oxygen requirement.  Chest pain w/ vomiting 2/2 most likely 2/2 GERD Substernal chest pressure at rest without radiation improved with vomiting. No current chest pain. Heart score of 3. Initial trop negative. No ST changes on EKG. Hx of recent TEE. Hx of significant coronary artery disease s/p CABG & MVR. - Trend troponins - Can be worked up as outpatient for possible hiatal hernia  Dyspnea 2/ productive cough 2/2 pulmonary edema 2/2 ESRD Was observed by ED provider satting 88 on RA. Currently satting 94 on 2L [place]. No home oxygen requirement. Chest X-ray show significant pulmonary edema worsened from last chest X-ray. Home dialysis schedule MWF at 6:30am. Given 1 time furosemide 40mg  in ED. - Nephro consult for inpatient dialysis - Strict I&Os, - Daily weights - Keep O2 sat above 88  ESRD on HD MWF Admit creatinine 10.07. Appear  fluid overloaded. K 5.0 - Nephro consult for inpatient dialysis - C/w home meds: cinacalcet 180mg  on dialysis days, sevelamer carbonate 3200mg  TID  HTN Admit bp 164/85 - C/w home meds: amlodipine 10mg  qhs, lisinopril 10mg  daily  Hx of questionable hypoglycemic episodes Had episodes of cbg in 50s during last admission although plasma glucose was low. Was being worked up as outpatient for hypoglycemia with fasting labs. Currently fasting at this time with last meal 12 hours ago. Will put in fasting labs at this time. - C-peptide, random insulin and sulfonylurea hypoglycemic panel  Hx of CAD s/p CAbg & MVR - C/w home meds: isosorbide mononitrate 30mg  daily, atorvastatin 20mg  daily, asa 81mg  daily  DVT prophx: subqheparin Diet: Renal/cardiac w/ fluid restriction Bowel: Senokot Code: Full  Dispo: Admit patient to Inpatient with expected length of stay greater than 2 midnights.  Signed: Mosetta Anis, MD 08/04/2018, 6:30 AM  Pager: [phone]

## 2018-08-04 NOTE — ED Triage Notes (Signed)
C/o sharp pain to center of chest and SOB that started tonight. Speaking in complete sentences.  Dialysis on Friday.  Pt believes his potassium may be elevated.  Denies nausea and vomiting.

## 2018-08-04 NOTE — Progress Notes (Signed)
Patient arrived to [place] from Dialysis. Patient alert and oriented x3. Admission assessment complete. Meal tray ordered for patient. Patient updated on plan of care. No other complaints at this time. Will continue to monitor patient.

## 2018-08-04 NOTE — ED Notes (Signed)
Report given to hemodialysis and 3E

## 2018-08-04 NOTE — ED Notes (Signed)
Pt.refused lab draw due to being in the hallway

## 2018-08-04 NOTE — ED Notes (Signed)
Internal Medicine resident paged x 3 regarding dialysis orders- pt becoming agitated regarding no dialysis - normally is dialyzed in the mornings as an outpatient.

## 2018-08-04 NOTE — ED Notes (Addendum)
Pt placed on 2L O2 [place] due to O2 level being 88% on room air.  O2 increased to 94%Provider bedside.  Will continue to monitor

## 2018-08-04 NOTE — ED Provider Notes (Signed)
[facility] EMERGENCY DEPARTMENT Provider Note   CSN: [id number] Arrival date & time: 08/04/18  0388     History   Chief Complaint Chief Complaint  Patient presents with  . Chest Pain  . Shortness of Breath    HPI [name] [name] is a 62 y.o. male.  The history is provided by the patient.  Chest Pain   This is a new problem. The current episode started yesterday. The problem occurs constantly. The problem has not changed since onset.The pain is moderate. The quality of the pain is described as pressure-like. The pain does not radiate. Associated symptoms include nausea, shortness of breath and vomiting. Pertinent negatives include no fever. He has tried nothing for the symptoms. Risk factors include being elderly.  His past medical history is significant for CAD.  Shortness of Breath  Associated symptoms include chest pain and vomiting. Pertinent negatives include no fever. Associated medical issues include CAD.  Patient with history of multiple medical conditions including CAD, CHF, ESRD on M/W/F, recent CABG/MVR presents with multiple complaints Reports over the past day he has been feeling sick, with increased blood pressure.  He also reports feeling chest pain/shortness of breath.  He reports dyspnea on exertion.  No fevers.  He does report vomiting Denies missed dialysis session Past Medical History:  Diagnosis Date  . Anemia   . Anginal pain ([place])   . CAD (coronary artery disease)    a. per [facility] s/p 3.23mm x 42mm Vision BMS to mid LAD 12/2009 and Xience DES to mid LAD 10/2010.  [name] Chronic diastolic CHF (congestive heart failure) ([place])   . Colon polyps   . Daily headache   . ESRD on dialysis [facility]) since ~ 2008   "MWF; Jeneen Rinks" (03/04/2017)  . GERD (gastroesophageal reflux disease)   . H/O TIA (transient ischemic attack) 04/01/2015  . H/O TIA (transient ischemic attack) 04/01/2015  . Heart murmur   . Hematochezia    a. 2014: colonscopy, which  showed moderately-sized internal hemorrhoids, two 35mm polyps in transverse colon and ascending colon that were resected, five 2-96mm polyps in sigmoid colon, descending colon, transverse colon, and ascending colon that were resected. An upper endoscopy was performed and showed normal esophagus, stomach, and duodenum.  . Hematuria    a. H/o hematuria 2014 with cystoscopy that was unrevealing for his source of hematuria. He underwent a kidney ultrasound on 10/14 that showed mildly echogenic and scarred kidneys compatible with medical renal disease, without hydronephrosis or renal calculi.  [name] History of blood transfusion    "had colonoscopy done; they had to give me some blood"  . History of kidney stones   . Hyperlipidemia   . Hypertension   . On home oxygen therapy    "2L prn" (07/21/2015); "been off it for awhile" (03/04/2017)  . Pneumonia   . Renal insufficiency   . 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 Type II diabetes mellitus ([place])   . Wears dentures     Patient Active Problem List   Diagnosis Date Noted  . Hypoglycemia 07/24/2018  . Muscle strain of right upper back 07/22/2018  . Shortness of breath 07/21/2018  . Mitral stenosis 07/21/2018  . Shoulder pain, right 07/10/2018  . Pleural effusion on left   . ESRD on dialysis ([place])   . SOB (shortness of breath) 06/06/2018  . Critical lower limb ischemia 05/27/2018  . Hx of CABG 02/02/2018  . Hypervolemia associated  with renal insufficiency 01/26/2018  . Restless leg syndrome 01/14/2018  . Anemia 01/14/2018  . Tobacco use 10/29/2017  . Mild cognitive impairment 06/13/2017  . Chest pain 06/07/2017  . Skin ulcer of toe of right foot, limited to breakdown of skin ([place])   . Callus of foot 03/07/2017  . Pulmonary hypertension ([place]) 07/28/2016  . Stroke-like symptoms 06/30/2016  . Peripheral neuropathy 05/07/2016  . S/P coronary artery stent placement 05/07/2016  . Nausea and vomiting 04/02/2016  . ESRD  (end stage renal disease) ([place]) 03/18/2016  . Mitral regurgitation   . Diabetic neuropathy ([place]) 07/29/2015  . Depression 07/22/2015  . Elevated troponin 06/04/2015  . GERD (gastroesophageal reflux disease) 06/04/2015  . Malnutrition of moderate degree ([place]) 05/17/2015  . Thrombocytopenia ([place]) 05/16/2015  . Weight loss 05/16/2015  . Diastolic dysfunction-grade 2 12/13/2014  . 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 Arm pain, right 05/25/2014  . Numbness 05/25/2014  . Hypertension 04/27/2014  . CAD -S/P LAD BMS 2011, LAD DES 2012- patent cors Feb 2016 04/27/2014  . Diabetes mellitus type 2, diet-controlled ([place]) 04/27/2014  . Background diabetic retinopathy ([place]) 08/13/2012  . Primary localized osteoarthrosis, lower leg 04/15/2012  . Senile nuclear sclerosis 11/29/2010  . Vitreous hemorrhage ([place]) 11/29/2010  . Atherosclerotic heart disease of native coronary artery without angina pectoris 12/12/2009  . Hypercholesterolemia 11/30/2003    Past Surgical History:  Procedure Laterality Date  . ABDOMINAL AORTOGRAM W/LOWER EXTREMITY N/A 05/29/2018   Procedure: ABDOMINAL AORTOGRAM W/LOWER EXTREMITY;  Surgeon: Lorretta Harp, MD;  Location: [facility] CV LAB;  Service: Cardiovascular;  Laterality: N/A;  . AV FISTULA PLACEMENT Left ~ 2007   "upper arm"  . CARDIAC CATHETERIZATION  "several"  . CORONARY ANGIOPLASTY WITH STENT PLACEMENT  "several"  . CORONARY ARTERY BYPASS GRAFT     3 grafts  . CYSTOSCOPY W/ STONE MANIPULATION  X2?  . EYE SURGERY Bilateral    "laser OR for hemorrhage"  . IR THORACENTESIS ASP PLEURAL SPACE W/IMG GUIDE  07/09/2018  . LEFT HEART CATHETERIZATION WITH CORONARY ANGIOGRAM N/A 11/23/2014   Procedure: LEFT HEART CATHETERIZATION WITH CORONARY ANGIOGRAM;  Surgeon: Troy Sine, MD;  Location: [facility] CATH LAB;  Service: Cardiovascular;  Laterality: N/A;  . LITHOTRIPSY  X1  . MITRAL VALVE REPLACEMENT    . REVISON OF ARTERIOVENOUS FISTULA Left 25/36/6440    Procedure: PLICATION OF LEFT ARM ARTERIOVENOUS FISTULA;  Surgeon: Angelia Mould, MD;  Location: [facility];  Service: Vascular;  Laterality: Left;  . REVISON OF ARTERIOVENOUS FISTULA Left 3/47/4259   Procedure: PLICATE ANEURYSM  OF LEFT ARTERIOVENOUS FISTULA;  Surgeon: Waynetta Sandy, MD;  Location: [facility];  Service: Vascular;  Laterality: Left;  . TEE WITHOUT CARDIOVERSION N/A 07/23/2018   Procedure: TRANSESOPHAGEAL ECHOCARDIOGRAM (TEE);  Surgeon: Sueanne Margarita, MD;  Location: [facility] ENDOSCOPY;  Service: Cardiovascular;  Laterality: N/A;        Home Medications    Prior to Admission medications   Medication Sig Start Date End Date Taking? Authorizing Provider  ACCU-CHEK FASTCLIX LANCETS MISC Check up to seven times per week 07/24/18   Seawell, Jaimie A, DO  albuterol (PROVENTIL HFA;VENTOLIN HFA) 108 (90 Base) MCG/ACT inhaler Inhale 2 puffs into the lungs every 6 (six) hours as needed for wheezing or shortness of breath. 06/07/18   Neva Seat, MD  amLODipine (NORVASC) 10 MG tablet Take 1 tablet (10 mg total) by mouth at bedtime. 04/17/18   Neva Seat, MD  aspirin EC 81 MG tablet Take 81 mg  by mouth daily.    [provider]  atorvastatin (LIPITOR) 20 MG tablet Take 1 tablet (20 mg total) by mouth at bedtime. 07/31/18   Neva Seat, MD  cinacalcet [facility]) 90 MG tablet Take 180 mg by mouth every Monday, Wednesday, and Friday with hemodialysis.     [provider]  glucose blood (ACCU-CHEK GUIDE) test strip Check up to seven times per week. 07/24/18   Seawell, Jaimie A, DO  isosorbide mononitrate (IMDUR) 30 MG 24 hr tablet Take 1 tablet (30 mg total) by mouth daily. 07/10/18 07/10/19  Neva Seat, MD  lisinopril (PRINIVIL,ZESTRIL) 10 MG tablet Take 1 tablet (10 mg total) by mouth daily. 07/10/18   Neva Seat, MD  multivitamin (RENA-VIT) TABS tablet TAKE 1 TABLET BY MOUTH ONCE DAILY AT BEDTIME Patient taking differently: Take 1 tablet  by mouth daily.  07/02/18   Neva Seat, MD  nitroGLYCERIN (NITROSTAT) 0.4 MG SL tablet Place 1 tablet (0.4 mg total) under the tongue every 5 (five) minutes as needed for chest pain. 07/10/18   Neva Seat, MD  ondansetron (ZOFRAN) 4 MG tablet Take 1 tablet (4 mg total) by mouth 4 (four) times daily as needed for nausea or vomiting. 03/04/18   Wynona Luna, MD  rOPINIRole (REQUIP) 0.25 MG tablet Take 1 tablet (0.25 mg total) by mouth at bedtime. 02/25/18   Bartholomew Crews, MD  sevelamer carbonate (RENVELA) 800 MG tablet Take 4 tablets (3,200 mg total) by mouth 3 (three) times daily with meals. Patient taking differently: Take 1,600-3,200 mg by mouth See admin instructions. Take 4 tablets (3200 mg) by mouth three times daily with meals and 2 tablets (1600 mg) with snacks 03/12/16   Geradine Girt, DO    Family History Family History  Problem Relation Age of Onset  . Bone cancer Mother   . Anuerysm Father   . Hypertension Unknown   . Diabetes type II Daughter   . Ovarian cancer Sister     Social History Social History   Tobacco Use  . Smoking status: Former Smoker    Packs/day: 0.30    Years: 8.00    Pack years: 2.40    Types: Cigars  . Smokeless tobacco: Never Used  . Tobacco comment: black and mild every 3-4 days  Substance Use Topics  . Alcohol use: No    Alcohol/week: 0.0 standard drinks  . Drug use: No     Allergies   Enalapril; Latex; and Tape   Review of Systems Review of Systems  Constitutional: Negative for fever.  Respiratory: Positive for shortness of breath.   Cardiovascular: Positive for chest pain.  Gastrointestinal: Positive for nausea and vomiting.  All other systems reviewed and are negative.    Physical Exam Updated Vital Signs BP (!) 202/91 (BP Location: Right Arm)   Pulse 98   Temp 98.4 F (36.9 C) (Oral)   Resp (!) 21   SpO2 95%   Physical Exam CONSTITUTIONAL: Well developed/well nourished, anxious HEAD:  Normocephalic/atraumatic EYES: EOMI/PERRL ENMT: Mucous membranes moist NECK: supple no meningeal signs SPINE/BACK:entire spine nontender CV: S1/S2 noted LUNGS: crackles bilaterally, no acute distress ABDOMEN: soft, nontender, no rebound or guarding, bowel sounds noted throughout abdomen GU:no cva tenderness NEURO: Pt is awake/alert/appropriate, moves all extremitiesx4.  No facial droop.   EXTREMITIES: pulses normal/equal, full ROM, no calf tenderness or edema, distal pulses intact SKIN: warm, color normal PSYCH: Anxious  ED Treatments / Results  Labs (all labs ordered are listed, but only abnormal results  are displayed) Labs Reviewed  BASIC METABOLIC PANEL - Abnormal; Notable for the following components:      Result Value   Sodium 133 (*)    Chloride 92 (*)    Glucose, Bld 105 (*)    BUN 44 (*)    Creatinine, Ser 10.07 (*)    GFR calc non Af Amer 5 (*)    GFR calc Af Amer 6 (*)    All other components within normal limits  CBC - Abnormal; Notable for the following components:   RBC 3.97 (*)    Hemoglobin 9.8 (*)    HCT 31.9 (*)    MCH 24.7 (*)    RDW 18.1 (*)    All other components within normal limits  I-STAT TROPONIN, ED    EKG ED ECG REPORT   Date: 08/04/2018 02:19  Rate: 96  Rhythm: normal sinus rhythm  QRS Axis: normal  Intervals: normal  ST/T Wave abnormalities: nonspecific ST changes  Conduction Disutrbances:nonspecific intraventricular conduction delay  Narrative Interpretation:   Old EKG Reviewed: unchanged  I have personally reviewed the EKG tracing and agree with the computerized printout as noted.  Radiology Dg Chest 2 View  Result Date: 08/04/2018 CLINICAL DATA:  62 year old male with chest pain and shortness of breath. EXAM: CHEST - 2 VIEW COMPARISON:  Chest radiograph dated 07/30/2018 FINDINGS: There is mild cardiomegaly with mild vascular congestion and small bilateral pleural effusions. Median sternotomy wires and mechanical cardiac valve.  No pneumothorax. No acute osseous pathology. IMPRESSION: Cardiomegaly with findings of CHF, worsened since the prior radiograph. Electronically Signed   By: Anner Crete M.D.   On: 08/04/2018 02:44    Procedures Procedures    Medications Ordered in ED Medications  furosemide (LASIX) injection 40 mg (has no administration in time range)  nitroGLYCERIN (NITROGLYN) 2 % ointment 1 inch (1 inch Topical Given 08/04/18 0529)  aspirin chewable tablet 324 mg (324 mg Oral Given 08/04/18 0529)     Initial Impression / Assessment and Plan / ED Course  I have reviewed the triage vital signs and the nursing notes.  Pertinent labs & imaging results that were available during my care of the patient were reviewed by me and considered in my medical decision making (see chart for details).     5:59 AM Patient presented with chest pain and pressure and shortness of breath.  He reported ongoing dyspnea on exertion.  He reports that his chest pain is very frequent, but shortness of breath and elevated blood pressure was worse than normal.  Here in the ER he has evidence of pulmonary edema, and has a new oxygen requirement. EKG was unchanged, no acute troponin changes.  Discussed with internal medicine for admission.  Final Clinical Impressions(s) / ED Diagnoses   Final diagnoses:  ESRD (end stage renal disease) ([place])  Acute pulmonary edema [facility])    ED Discharge Orders    None       Ripley Fraise, MD 08/04/18 0600

## 2018-08-04 NOTE — ED Notes (Signed)
Spoke with Dr. Justin Mend regarding dialysis. Pt states that if he has to go tonight, he "just won't go"

## 2018-08-04 NOTE — Progress Notes (Signed)
   Subjective: Patient was seen and evaluated at bedside on morning rounds. No acute events since admission. Denies chest pain, shortness of breath, nausea and vomiting.  Objective:  Vital signs in last 24 hours: Vitals:   08/04/18 0446 08/04/18 0518 08/04/18 0715 08/04/18 1300  BP: (!) 202/91 (!) 171/90 (!) 153/85   Pulse: 98 88 91 92  Resp: (!) 21 20 (!) 22 20  Temp: 98.4 F (36.9 C)     TempSrc: Oral     SpO2: 95% 95% 99% 97%   Physical Exam  Constitutional: He is oriented to person, place, and time and well-developed, well-nourished, and in no distress. No distress.  HENT:  Head: Normocephalic and atraumatic.  Neck: No JVD present.  Cardiovascular: Normal rate and regular rhythm.  Murmur heard. Pulmonary/Chest: Breath sounds normal. He is not in respiratory distress. He has no wheezes. He has faint bibasilar rales.  Abdominal: Soft. Bowel sounds are normal. He exhibits no distension. There is no tenderness. There is no rebound.  Musculoskeletal: Normal range of motion. He exhibits no edema.  Neurological: He is alert and oriented to person, place, and time.  Skin: Skin is warm and dry. He is not diaphoretic.  Psychiatric: Mood, memory and affect normal.   Assessment/Plan:  Active Problems:   Pulmonary edema with congestive heart failure ([place])  JeremiahMartin is a 62 yo M w/ PMH of CAD s/p cabgx2 & mvr in 12/2017, ESRD on MWF HD, anemia, L8VF, GERD, diastolic CHF and PVD presenting with atypical chest pain. He recently had a TEE for evaluation of his mitral valve and since then he appears to have recurrent chest pain associated with nausea and vomiting. He has heart score of 3 but his presentation and relief with vomiting makes GI source of pain more likely however he has significant pulmonary edema on exam and X-ray with new oxygen requirement. He is scheduled for dialysis session at this time and his weight is up from [place] on Friday to 89.4kg today. Most likely fluid overloaded due to  being close to his dialysis session. He will be admitted for inpatient dialysis to take off fluids and monitor his respiratory status and oxygen requirement.  Chest pain w/ vomiting most likely 2/2 GERD or hypoglycemia: Unchanged EKG, Negative Trop, hiatal hernia on CXR Resolved -F/u out patient for hiatal hernia   Dyspnea and productive cough 2/2 pulmonary edema 2/2 ESRD Was observed by ED provider satting 88 on RA. Currently satting 94 on 2L [place]. No home oxygen requirement. Chest X-ray show significant pulmonary edema worsened from last chest X-ray. Home dialysis schedule MWF at 6:30am. Given 1 time furosemide 40mg  in ED. Mild hypervolemia on exam now - Strict I&Os, - Daily weights - Keep O2 sat above 88  ESRD on HD MWF - Nephro consult and planned for inpatient dialysis today - C/w home meds: cinacalcet 180mg  on dialysis days, sevelamer carbonate 3200mg  TID  HTN today bp 153/85 - C/w home meds: amlodipine 10mg  qhs, lisinopril 10mg  daily  Hx of questionable hypoglycemic episodes: - f/u results of C-peptide, random insulin and sulfonylurea hypoglycemic panel  Hx of CAD s/p CAbg & MVR - C/w home meds: isosorbide mononitrate 30mg  daily, atorvastatin 20mg  daily, asa 81mg  daily  DVT prophx: subqheparin Diet: Renal/cardiac w/ fluid restriction IV fluid: none Code: Full Dispo: Anticipated discharge in approximately1-2 days  Dewayne Hatch, MD 08/04/2018, 3:39 PM Pager: [phone]

## 2018-08-05 DIAGNOSIS — I5030 Unspecified diastolic (congestive) heart failure: Secondary | ICD-10-CM

## 2018-08-05 DIAGNOSIS — K449 Diaphragmatic hernia without obstruction or gangrene: Secondary | ICD-10-CM | POA: Diagnosis not present

## 2018-08-05 DIAGNOSIS — N186 End stage renal disease: Secondary | ICD-10-CM | POA: Diagnosis not present

## 2018-08-05 DIAGNOSIS — I132 Hypertensive heart and chronic kidney disease with heart failure and with stage 5 chronic kidney disease, or end stage renal disease: Secondary | ICD-10-CM | POA: Diagnosis not present

## 2018-08-05 DIAGNOSIS — E1165 Type 2 diabetes mellitus with hyperglycemia: Secondary | ICD-10-CM | POA: Diagnosis not present

## 2018-08-05 DIAGNOSIS — D631 Anemia in chronic kidney disease: Secondary | ICD-10-CM | POA: Diagnosis not present

## 2018-08-05 DIAGNOSIS — I2510 Atherosclerotic heart disease of native coronary artery without angina pectoris: Secondary | ICD-10-CM | POA: Diagnosis not present

## 2018-08-05 DIAGNOSIS — D649 Anemia, unspecified: Secondary | ICD-10-CM | POA: Diagnosis not present

## 2018-08-05 DIAGNOSIS — E1122 Type 2 diabetes mellitus with diabetic chronic kidney disease: Secondary | ICD-10-CM | POA: Diagnosis not present

## 2018-08-05 DIAGNOSIS — I120 Hypertensive chronic kidney disease with stage 5 chronic kidney disease or end stage renal disease: Secondary | ICD-10-CM | POA: Diagnosis not present

## 2018-08-05 DIAGNOSIS — K219 Gastro-esophageal reflux disease without esophagitis: Secondary | ICD-10-CM | POA: Diagnosis not present

## 2018-08-05 DIAGNOSIS — R1110 Vomiting, unspecified: Secondary | ICD-10-CM | POA: Diagnosis not present

## 2018-08-05 DIAGNOSIS — N2581 Secondary hyperparathyroidism of renal origin: Secondary | ICD-10-CM | POA: Diagnosis not present

## 2018-08-05 DIAGNOSIS — Z992 Dependence on renal dialysis: Secondary | ICD-10-CM | POA: Diagnosis not present

## 2018-08-05 DIAGNOSIS — R0789 Other chest pain: Secondary | ICD-10-CM | POA: Diagnosis not present

## 2018-08-05 DIAGNOSIS — E1151 Type 2 diabetes mellitus with diabetic peripheral angiopathy without gangrene: Secondary | ICD-10-CM | POA: Diagnosis not present

## 2018-08-05 DIAGNOSIS — J810 Acute pulmonary edema: Secondary | ICD-10-CM | POA: Diagnosis not present

## 2018-08-05 LAB — RENAL FUNCTION PANEL
ALBUMIN: 3.5 g/dL (ref 3.5–5.0)
ANION GAP: 13 (ref 5–15)
BUN: 24 mg/dL — ABNORMAL HIGH (ref 6–20)
CO2: 28 mmol/L (ref 22–32)
Calcium: 9.1 mg/dL (ref 8.9–10.3)
Chloride: 95 mmol/L — ABNORMAL LOW (ref 98–111)
Creatinine, Ser: 6.61 mg/dL — ABNORMAL HIGH (ref 0.61–1.24)
GFR, EST AFRICAN AMERICAN: 9 mL/min — AB (ref >60)
GFR, EST NON AFRICAN AMERICAN: 8 mL/min — AB (ref >60)
Glucose, Bld: 86 mg/dL (ref 70–99)
PHOSPHORUS: 4.4 mg/dL (ref 2.5–4.6)
POTASSIUM: 4.8 mmol/L (ref 3.5–5.1)
Sodium: 136 mmol/L (ref 135–145)

## 2018-08-05 LAB — C-PEPTIDE

## 2018-08-05 LAB — GLUCOSE, CAPILLARY: GLUCOSE-CAPILLARY: 121 mg/dL — AB (ref 70–99)

## 2018-08-05 LAB — INSULIN, RANDOM

## 2018-08-05 MED ORDER — PROCHLORPERAZINE EDISYLATE 10 MG/2ML IJ SOLN
10.0000 mg | Freq: Once | INTRAMUSCULAR | Status: AC
  Administered 2018-08-05: 10 mg via INTRAVENOUS
  Filled 2018-08-05: qty 2

## 2018-08-05 NOTE — Care Management Obs Status (Signed)
[place] NOTIFICATION   Patient Details  Name: Neyland Pettengill MRN: [id number] Date of Birth: 02/12/1956   Medicare Observation Status Notification Given:  Yes    Royston Bake, RN 08/05/2018, 1:42 PM

## 2018-08-05 NOTE — Progress Notes (Signed)
Pt c/o back pain asking for his tramadol he takes at home, I paged md and they said they would be making rounds and assess,, pt was walking in hallway no distress and reports theytold him he was going home, pt wanting more juice, pt advised of fluid restriction " I m thirsty" nephrologist was informed earlier when he rounded pt request a lot of fluid to drink

## 2018-08-05 NOTE — Progress Notes (Signed)
Patient refuses to be compliant with fluid restriction. States he need to drink because his blood sugar is dropping. RN has checked CBG regularly during this shift. Last CBG 121. Patient has been educated on need for fluid restriction. Will continue to monitor patient.

## 2018-08-05 NOTE — Progress Notes (Signed)
Discharge instructions discussed with patient including follow up appointments. Patient verbalizes understanding of instructions. Patient wheeled down by nursing staff.

## 2018-08-05 NOTE — Progress Notes (Signed)
Pt asking about discharge. " they told me I was going home this am?" I advised no dc order, paged per pt request

## 2018-08-05 NOTE — Discharge Summary (Signed)
Name: Bryer Cozzolino MRN: [id number] DOB: Jun 12, 1956 62 y.o. PCP: Neva Seat, MD  Date of Admission: 08/04/2018  2:15 AM Date of Discharge: 08/05/2018 Attending Physician: Lucious Groves, DO  Discharge Diagnosis: Active Problems:   Acute pulmonary edema [facility])   Pulmonary edema with congestive heart failure [facility])    Discharge Medications: Allergies as of 08/05/2018      Reactions   Enalapril Hives, Rash   Latex Rash   Tape Rash, Other (See Comments)   TAPE MAKES SKIN BREAK OUT AND TURN RED      Medication List    TAKE these medications   ACCU-CHEK FASTCLIX LANCETS Misc Check up to seven times per week   albuterol 108 (90 Base) MCG/ACT inhaler Commonly known as:  PROVENTIL HFA;VENTOLIN HFA Inhale 2 puffs into the lungs every 6 (six) hours as needed for wheezing or shortness of breath.   amLODipine 10 MG tablet Commonly known as:  NORVASC Take 1 tablet (10 mg total) by mouth at bedtime.   aspirin EC 81 MG tablet Take 81 mg by mouth daily.   atorvastatin 20 MG tablet Commonly known as:  LIPITOR Take 1 tablet (20 mg total) by mouth at bedtime.   cinacalcet 90 MG tablet Commonly known as:  SENSIPAR Take 180 mg by mouth every Monday, Wednesday, and Friday with hemodialysis.   glucose blood test strip Check up to seven times per week.   isosorbide mononitrate 30 MG 24 hr tablet Commonly known as:  IMDUR Take 1 tablet (30 mg total) by mouth daily.   lisinopril 10 MG tablet Commonly known as:  PRINIVIL,ZESTRIL Take 1 tablet (10 mg total) by mouth daily.   multivitamin Tabs tablet TAKE 1 TABLET BY MOUTH ONCE DAILY AT BEDTIME What changed:  when to take this   nitroGLYCERIN 0.4 MG SL tablet Commonly known as:  NITROSTAT Place 1 tablet (0.4 mg total) under the tongue every 5 (five) minutes as needed for chest pain.   ondansetron 4 MG tablet Commonly known as:  ZOFRAN Take 1 tablet (4 mg total) by mouth 4 (four) times daily as needed for nausea or  vomiting.   rOPINIRole 0.25 MG tablet Commonly known as:  REQUIP Take 1 tablet (0.25 mg total) by mouth at bedtime.   sevelamer carbonate 800 MG tablet Commonly known as:  RENVELA Take 4 tablets (3,200 mg total) by mouth 3 (three) times daily with meals. What changed:    how much to take  when to take this  additional instructions   traMADol 50 MG tablet Commonly known as:  ULTRAM Take 50 mg by mouth every 6 (six) hours as needed for moderate pain.       Disposition and follow-up:   Mr.Delshon Klaus was discharged from [facility] in Stable condition.  At the hospital follow up visit please address:  1.  Weight 83.6 kg on this discharge. Please evaluate for volume overload sign and symptoms.  2.  Labs / imaging needed at time of follow-up: BMP  3.  Pending labs/ test needing follow-up: Sulfonylurea, Insulin, C-Peptid (Was supposed to collceted ig hypoglycemia on CBG, confirmed with plasma glucose. This canceled by lab due to hemolysis and also not clear if sample for Insulin, c-peptide, collected before giving glucose/juice to patient although clarified on order.) Recommend to repeat so, if another hypoglycemia detected.    Follow-up Appointments: Follow-up Information    Neva Seat, MD Follow up.   Specialty:  Internal Medicine Why:  See your primary  care physician as scheduled for you on 08/07/2018 Contact information: [place] [id number] [phone]           Hospital Course by problem list: Mr. Saladin, is a 62 year old male with past medical history of CAD, s/p CABG and mitral valve replacement, ESRD on hemodialysis, type 2 diabetes not on any therapy, GERD, diastolic congestive heart failure and peripheral vascular disease.  He presented with generalized malaise, feeling clammy and diaphoresis when he was watching TV.  He also reports some chest congestion and mild shortness of breath as well as brief left chest/shoulder  pain.  On arrival, EKG was unchanged, troponin trend was flat.  He remained asymptomatic. His weight on arrival was 89.4 (last dry weight 85.3 on discharge at July 23, 2018).  He also found to have JVD the as well as bibasilar crackles.  Chest x-ray showed pulmonary edema. Remained asymptomatic during admission.  Weight 83.6 kg on this discharge  2.ESRD: Patient did not have missed HD recently but had 1 uncompleted sessions due to nausea.  Had hypervolemia on arrival  Got hemodialysis at next day of admission.   2. Hypoglycemia: Patient has had episode of hypoglycemia. CBG: 63, 106, 86  He denies taking antigenemic medications. Insulin, C-Peptide, Sulfonylurea test sent an are pending. (How ever, BG canceled by lab due to hemolysis and also not clear if sample for Insulin, c-peptide, collected before giving glucose/juice to patient although clarified on order.)  Discharge Vitals:   BP (!) 183/90 (BP Location: Right Arm)   Pulse (!) 105   Temp 97.8 F (36.6 C) (Oral)   Resp 18   Ht 6' (1.829 m)   Wt 83.6 kg Comment: scale b  SpO2 97%   BMI 24.98 kg/m   Pertinent Labs, Studies, and Procedures:  BMP Latest Ref Rng & Units 08/05/2018 08/04/2018 08/04/2018  Glucose 70 - 99 mg/dL 86 120(H) 105(H)  BUN 6 - 20 mg/dL 24(H) 52(H) 44(H)  Creatinine 0.61 - 1.24 mg/dL 6.61(H) 11.45(H) 10.07(H)  BUN/Creat Ratio 10 - 24 - - -  Sodium 135 - 145 mmol/L 136 134(L) 133(L)  Potassium 3.5 - 5.1 mmol/L 4.8 5.6(H) 5.0  Chloride 98 - 111 mmol/L 95(L) 92(L) 92(L)  CO2 22 - 32 mmol/L 28 28 28   Calcium 8.9 - 10.3 mg/dL 9.1 9.0 9.1   Troponin:0.03, 0.03   CXR:07/30/2018 Mild cardiomegaly with mild vascular congestion and small bilateral pleural effusions. Mechanical cardiac valve.  Cardiomegaly with findings of CHF, worsened since the prior radiograph.    Discharge Instructions: Discharge Instructions    Diet - low sodium heart healthy   Complete by:  As directed    Discharge instructions    Complete by:  As directed    Mr. Baumgarten we observed you in the hospital, we do not feel that the discomfort, cold clammy sweats he had during the early morning for your arrival was related to your heart.  There is some concern that he may be having hypoglycemic episodes.  If you do have another this episode of how he felt please use your glucometer and see what your blood sugar is.  If it is low please drink some juice or some other sugary liquid and recheck it in about 15 minutes see if your symptoms had improved.  You will need to follow-up in our clinic as we are currently trying to work you up for causes of hypoglycemia.  We attempted to get some test while you are here in the  hospital, these will need to be followed up in our outpatient setting with Dr. Trilby Drummer.   Increase activity slowly   Complete by:  As directed    Increase activity slowly   Complete by:  As directed       Signed: Dewayne Hatch, MD 08/05/2018, 1:16 PM   Pager: [phone]

## 2018-08-05 NOTE — Progress Notes (Signed)
   Subjective: Patient was seen and evaluated at bedside on morning rounds. No acute events overnight. Complained of some upper back pain. No shortness of breath. No chest pain.   Objective:  Vital signs in last 24 hours: Vitals:   08/04/18 1938 08/04/18 2007 08/05/18 0100 08/05/18 0532  BP:  (!) 177/88 (!) 178/90 (!) 183/90  Pulse:  95 96 (!) 105  Resp:  18 18 18   Temp:  98.5 F (36.9 C) 98 F (36.7 C) 97.8 F (36.6 C)  TempSrc:  Oral Oral Oral  SpO2:  94% 96% 97%  Weight: 83.9 kg   83.6 kg  Height: 6' (1.829 m)      Physical exam: Well developed, well nourished CV: RRR, has systolic murmur Lungs: In no acute distress. Has normal work of breathing. No wheeze, no rale Abdomen: BS are present. Abdomen is soft. Has no tenderness.  Musculoskeletal: Tenderness at left scapula blade. Worsens with arm abduction. Pulses are normal bilaterally. No lower extremity edema Neurology exam: patient as alert and oriented x3 Psychology: Nl mood and affect. Behavior is normal    Assessment/Plan:  Active Problems:   Acute pulmonary edema (HCC)   Pulmonary edema with congestive heart failure (HCC)  Mr.Rynders is a 62 yo M w/ PMH ofCAD s/p cabgx2 & mvr in 12/2017, ESRD on MWF HD, anemia, L4YT, GERD, diastolic CHFand PVD presenting with atypical chest pain. Had significant pulmonary edema on exam and X-ray with new oxygen requirement.  His weight was up from [place] on Friday (after last HD) to 89.4kg on admissiojn.    Chest pain w/ vomiting most likely 2/2 GERD or hypoglycemia: Resolved Unchanged EKG, Flat Trop, hiatal hernia on CXR. Likely in setting of Gi origin vs hypoglycemia  -F/u out patient for hiatal hernia   Dyspnea and productive cough 2/2 pulmonary edema due to ESRD With hypoxia on arrival (88 on RA.) initial Chest X-ray showed significant pulmonary edema S/p HD yesterday Currently stable. Mild hypervolemia on exam   -discharge today - Daily weights    ESRD on HD  MWF S/p hemodialysis yesterday. -f/u with out patient HD schedule - C/w home meds: cinacalcet 180mg  on dialysis days, sevelamer carbonate 3200mg  TID  HTN Hypertensive at 183/90.  -Recommend to c/w home meds: amlodipine 10mg  qhs, lisinopril 10mg  daily and nephrology follow up to adjust HD volume if required and planned accordingly  Hx of questionable hypoglycemic episodes: Blood test for C-peptide, random insulin and sulfonylurea already sent. Supposed to be collected at the same time with BMP and before giving patient juice or food. Not sure if this has been the case and also, the BMP has been canceled due to hemolysis sample.  There fore, the result of the hypoglycemic panel won't be reliable. Tried to call the lab to cancel the test to avoid cost, how ever, it has been sent to [facility] already.   Hx of CAD s/p CABG & MVR - C/w home meds: isosorbide mononitrate 30mg  daily, atorvastatin 20mg  daily, asa 81mg  daily  Back pain: Has been a chronic problem. On exam has localized tenderness at medial of scapula. Likely 2/2 rhomboid muscle strain. -f/u out patient and continue home meds  DVT prophx:subqheparin Diet:Renal/cardiac w/ fluid restriction IV fluid: none Code:Full  Dispo: Discharge today  Dewayne Hatch, MD 08/05/2018, 11:36 AM Pager: [phone]

## 2018-08-05 NOTE — Progress Notes (Signed)
[facility] ROUNDING NOTE   Subjective:   Comfortable this morning described no evidence of any chest pain underwent dialysis 08/04/2018.  Removal 2.3 L.  Blood pressure 183/90 pulse 94 temperature 97.8 O2 sats 97% room air  Sodium 136 potassium 4.8 chloride 95 CO2 28 glucose 86 BUN 24 creatinine 6.61 calcium 9.1 phosphorus 4.4 albumin 3.5    Last hemoglobin 9.6 08/04/2018   Objective:  Vital signs in last 24 hours:  Temp:  [97.4 F (36.3 C)-98.5 F (36.9 C)] 97.8 F (36.6 C) (10/29 0532) Pulse Rate:  [83-105] 105 (10/29 0532) Resp:  [18-20] 18 (10/29 0532) BP: (149-183)/(78-98) 183/90 (10/29 0532) SpO2:  [94 %-98 %] 97 % (10/29 0532) Weight:  [83.6 kg-84.3 kg] 83.6 kg (10/29 0532)  Weight change:  Filed Weights   08/04/18 1826 08/04/18 1938 08/05/18 0532  Weight: 84.3 kg 83.9 kg 83.6 kg    Intake/Output: I/O last 3 completed shifts: In: 600 [P.O.:600] Out: 2376 [Urine:100; Other:2276]   Intake/Output this shift:  Total I/O In: 360 [P.O.:360] Out: -    Awake alert nondistressed Cardiovascular regular rate and rhythm systolic murmur 2 out of 6 no rubs no gallops Respiratory clear to auscultation diminished breath sounds at bases Extremities no evidence of edema no wounds left upper arm AV fistula   Basic Metabolic Panel: Recent Labs  Lab 07/30/18 1432 07/31/18 0047 08/04/18 0229 08/04/18 1401 08/05/18 0438  NA 137 135 133* 134* 136  K 3.9 4.1 5.0 5.6* 4.8  CL 90* 91* 92* 92* 95*  CO2 30 28 28 28 28   GLUCOSE 73 90 105* 120* 86  BUN 14 21* 44* 52* 24*  CREATININE 4.88* 6.46* 10.07* 11.45* 6.61*  CALCIUM 8.5* 8.8* 9.1 9.0 9.1  PHOS  --   --   --   --  4.4    Liver Function Tests: Recent Labs  Lab 07/31/18 0047 08/04/18 1401 08/05/18 0438  AST 14* 8*  --   ALT 13 9  --   ALKPHOS 62 57  --   BILITOT 0.8 0.9  --   PROT 8.9* 7.6  --   ALBUMIN 3.8 3.3* 3.5   Recent Labs  Lab 07/31/18 0047  LIPASE 40   No results for input(s):  AMMONIA in the last 168 hours.  CBC: Recent Labs  Lab 07/30/18 1432 07/31/18 0047 08/04/18 0229 08/04/18 1401  WBC 6.8 8.4 9.8 8.6  HGB 10.3* 10.8* 9.8* 9.6*  HCT 33.6* 36.0* 31.9* 31.7*  MCV 79.8* 80.9 80.4 80.5  PLT 241 250 236 238    Cardiac Enzymes: Recent Labs  Lab 07/30/18 1943 08/04/18 0640 08/04/18 1140  TROPONINI <0.03 0.03* 0.03*    BNP: Invalid input(s): POCBNP  CBG: Recent Labs  Lab 08/04/18 0630 08/04/18 1121 08/04/18 1856 08/04/18 2336 08/05/18 0129  GLUCAP 63* 106* 35 76 121*    Microbiology: Results for orders placed or performed during the hospital encounter of 07/21/18  MRSA PCR Screening     Status: None   Collection Time: 07/21/18  3:05 AM  Result Value Ref Range Status   MRSA by PCR NEGATIVE NEGATIVE Final    Comment:        The GeneXpert MRSA Assay (FDA approved for NASAL specimens only), is one component of a comprehensive MRSA colonization surveillance program. It is not intended to diagnose MRSA infection nor to guide or monitor treatment for MRSA infections. Performed at [facility], [place] [address], [place], [place] [address]  Coagulation Studies: No results for input(s): LABPROT, INR in the last 72 hours.  Urinalysis: No results for input(s): COLORURINE, LABSPEC, PHURINE, GLUCOSEU, HGBUR, BILIRUBINUR, KETONESUR, PROTEINUR, UROBILINOGEN, NITRITE, LEUKOCYTESUR in the last 72 hours.  Invalid input(s): APPERANCEUR    Imaging: Dg Chest 2 View  Result Date: 08/04/2018 CLINICAL DATA:  62 year old male with chest pain and shortness of breath. EXAM: CHEST - 2 VIEW COMPARISON:  Chest radiograph dated 07/30/2018 FINDINGS: There is mild cardiomegaly with mild vascular congestion and small bilateral pleural effusions. Median sternotomy wires and mechanical cardiac valve. No pneumothorax. No acute osseous pathology. IMPRESSION: Cardiomegaly with findings of CHF, worsened since the prior radiograph. Electronically Signed    By: Anner Crete M.D.   On: 08/04/2018 02:44     Medications:    . amLODipine  10 mg Oral QHS  . aspirin EC  81 mg Oral Daily  . atorvastatin  20 mg Oral QHS  . [START ON 08/06/2018] calcitRIOL  0.5 mcg Oral Q M,W,F-HD  . Chlorhexidine Gluconate Cloth  6 each Topical Q0600  . cinacalcet  180 mg Oral Q M,W,F-HD  . heparin  5,000 Units Subcutaneous Q8H  . isosorbide mononitrate  30 mg Oral Daily  . lidocaine  1 patch Transdermal Q24H  . lisinopril  10 mg Oral Daily  . multivitamin  1 tablet Oral QHS  . rOPINIRole  0.25 mg Oral QHS  . senna  1 tablet Oral BID  . sevelamer carbonate  4,000 mg Oral TID WC   acetaminophen **OR** acetaminophen, polyethylene glycol  Assessment/ Plan:   Chest pain troponin 0.0 20.030.03 unchanged EKG hiatal hernia on chest x-ray considering GI evaluation for hiatal hernia  Pulmonary edema O2 sats improved diet Lasix will be effective continue dialysis regimen Monday Wednesday Friday  ESRD Monday Wednesday Friday  Anemia stable continue to follow hemoglobin outpatient ESA  Metabolic bone disease stable continue Sensipar VDR a binders  Nutrition renal diet renal vitamins Nepro  Diabetes mellitus well controlled   LOS: [place] @TODAY @9 :34 AM

## 2018-08-05 NOTE — Care Management CC44 (Signed)
Condition Code 44 Documentation Completed  Patient Details  Name: Beckhem Isadore MRN: [id number] Date of Birth: 07/02/1956   Condition Code 44 given:  Yes Patient signature on Condition Code 44 notice:  Yes Documentation of 2 MD's agreement:  Yes Code 44 added to claim:  Yes    Royston Bake, RN 08/05/2018, 1:43 PM

## 2018-08-06 LAB — INSULIN, RANDOM: INSULIN: 10 u[IU]/mL (ref 2.6–24.9)

## 2018-08-06 LAB — C-PEPTIDE: C-Peptide: 10.7 ng/mL — ABNORMAL HIGH (ref 1.1–4.4)

## 2018-08-07 ENCOUNTER — Other Ambulatory Visit: Payer: Self-pay

## 2018-08-07 ENCOUNTER — Ambulatory Visit (INDEPENDENT_AMBULATORY_CARE_PROVIDER_SITE_OTHER): Payer: Medicare Other | Admitting: Internal Medicine

## 2018-08-07 VITALS — BP 165/81 | HR 93 | Temp 97.7°F | Ht 72.0 in | Wt 193.4 lb

## 2018-08-07 DIAGNOSIS — E1140 Type 2 diabetes mellitus with diabetic neuropathy, unspecified: Secondary | ICD-10-CM

## 2018-08-07 DIAGNOSIS — S29012D Strain of muscle and tendon of back wall of thorax, subsequent encounter: Secondary | ICD-10-CM

## 2018-08-07 DIAGNOSIS — Z7982 Long term (current) use of aspirin: Secondary | ICD-10-CM | POA: Diagnosis not present

## 2018-08-07 DIAGNOSIS — E1142 Type 2 diabetes mellitus with diabetic polyneuropathy: Secondary | ICD-10-CM

## 2018-08-07 DIAGNOSIS — X58XXXD Exposure to other specified factors, subsequent encounter: Secondary | ICD-10-CM | POA: Diagnosis not present

## 2018-08-07 DIAGNOSIS — Z87891 Personal history of nicotine dependence: Secondary | ICD-10-CM

## 2018-08-07 DIAGNOSIS — J810 Acute pulmonary edema: Secondary | ICD-10-CM

## 2018-08-07 DIAGNOSIS — G8929 Other chronic pain: Secondary | ICD-10-CM

## 2018-08-07 DIAGNOSIS — Z9981 Dependence on supplemental oxygen: Secondary | ICD-10-CM | POA: Diagnosis not present

## 2018-08-07 DIAGNOSIS — N186 End stage renal disease: Secondary | ICD-10-CM

## 2018-08-07 DIAGNOSIS — I050 Rheumatic mitral stenosis: Secondary | ICD-10-CM

## 2018-08-07 DIAGNOSIS — Z79899 Other long term (current) drug therapy: Secondary | ICD-10-CM | POA: Diagnosis not present

## 2018-08-07 DIAGNOSIS — R011 Cardiac murmur, unspecified: Secondary | ICD-10-CM | POA: Diagnosis not present

## 2018-08-07 DIAGNOSIS — Z992 Dependence on renal dialysis: Secondary | ICD-10-CM

## 2018-08-07 DIAGNOSIS — E1122 Type 2 diabetes mellitus with diabetic chronic kidney disease: Secondary | ICD-10-CM

## 2018-08-07 DIAGNOSIS — M549 Dorsalgia, unspecified: Secondary | ICD-10-CM

## 2018-08-07 LAB — GLUCOSE, CAPILLARY: Glucose-Capillary: 73 mg/dL (ref 70–99)

## 2018-08-07 MED ORDER — PREGABALIN 50 MG PO CAPS: 50.0000 mg | ORAL_CAPSULE | Freq: Three times a day (TID) | ORAL | 2 refills | Status: DC

## 2018-08-07 MED ORDER — CYCLOBENZAPRINE HCL 5 MG PO TABS: 5.0000 mg | ORAL_TABLET | Freq: Three times a day (TID) | ORAL | 0 refills | Status: DC | PRN

## 2018-08-07 NOTE — Progress Notes (Signed)
   CC: SOB  HPI:  [name] is a 62 y.o. with PMH as below.   Please see A&P for assessment of the patient's acute and chronic medical conditions.   Past Medical History:  Diagnosis Date  . Anemia   . Anginal pain ([place])   . CAD (coronary artery disease)    a. per [facility] s/p 3.81mm x 25mm Vision BMS to mid LAD 12/2009 and Xience DES to mid LAD 10/2010.  [name] Chronic diastolic CHF (congestive heart failure) ([place])   . Colon polyps   . Daily headache   . ESRD on dialysis [facility]) since ~ 2008   "MWF; Jeneen Rinks" (03/04/2017)  . GERD (gastroesophageal reflux disease)   . H/O TIA (transient ischemic attack) 04/01/2015  . H/O TIA (transient ischemic attack) 04/01/2015  . Heart murmur   . Hematochezia    a. 2014: colonscopy, which showed moderately-sized internal hemorrhoids, two 81mm polyps in transverse colon and ascending colon that were resected, five 2-5mm polyps in sigmoid colon, descending colon, transverse colon, and ascending colon that were resected. An upper endoscopy was performed and showed normal esophagus, stomach, and duodenum.  . Hematuria    a. H/o hematuria 2014 with cystoscopy that was unrevealing for his source of hematuria. He underwent a kidney ultrasound on 10/14 that showed mildly echogenic and scarred kidneys compatible with medical renal disease, without hydronephrosis or renal calculi.  [name] History of blood transfusion    "had colonoscopy done; they had to give me some blood"  . History of kidney stones   . Hyperlipidemia   . Hypertension   . On home oxygen therapy    "2L prn" (07/21/2015); "been off it for awhile" (03/04/2017)  . Pneumonia   . Renal insufficiency   . 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 Type II diabetes mellitus ([place])   . Wears dentures    Review of Systems:   Review of Systems  Constitutional: Positive for malaise/fatigue. Negative for chills and fever.  Respiratory: Positive for shortness of breath.  Negative for cough.   Cardiovascular: Positive for chest pain and orthopnea. Negative for palpitations.  Gastrointestinal: Positive for diarrhea, nausea and vomiting. Negative for blood in stool.    Physical Exam:  Constitution: acutely ill appearing, vomiting intermittently Cardio: regular rate, systolic murmur, trace edema Respiratory: bibasilar crackles, course breath sounds MSK: left AVF with palpable thrill  Neuro: a&o Skin: c/d/i    Vitals:   08/07/18 0839  BP: (!) 165/81  Pulse: 93  Temp: 97.7 F (36.5 C)  TempSrc: Oral  SpO2: 100%  Weight: 193 lb 6.4 oz (87.7 kg)  Height: 6' (1.829 m)     Assessment & Plan:   See Encounters Tab for problem based charting.  Patient seen with Dr. Daryll Drown

## 2018-08-07 NOTE — Patient Instructions (Addendum)
Thank you for allowing [place] to provide your care today. Today we discussed End Stage Renal Disease and Mitral Valve Stenosis    I have ordered the following labs for you:    I will call if any are abnormal.    Today we made the following changes to your medications:   Please START taking Flexaril every 6 hours as needed for back pain.   Please START taking Lyrica 50 mg three times per day every 6 hours  For your shortness of breath, you may increase your oxygen at night when you lay down. Make sure to prop yourself up with pillows as this may help your breathing.   Please make sure to attend dialysis tomorrow. Please make sure to make an appointment with [facility] to follow-up with your cardiologist there.   Please follow-up in two weeks. If you begin to feel better you may cancel your appointment.     Should you have any questions or concerns please call the internal medicine clinic at [phone].

## 2018-08-08 ENCOUNTER — Other Ambulatory Visit: Payer: Self-pay | Admitting: *Deleted

## 2018-08-08 DIAGNOSIS — G8929 Other chronic pain: Secondary | ICD-10-CM | POA: Diagnosis not present

## 2018-08-08 DIAGNOSIS — E162 Hypoglycemia, unspecified: Secondary | ICD-10-CM

## 2018-08-08 DIAGNOSIS — Z87891 Personal history of nicotine dependence: Secondary | ICD-10-CM | POA: Diagnosis not present

## 2018-08-08 DIAGNOSIS — N2581 Secondary hyperparathyroidism of renal origin: Secondary | ICD-10-CM | POA: Diagnosis not present

## 2018-08-08 DIAGNOSIS — N186 End stage renal disease: Secondary | ICD-10-CM | POA: Diagnosis not present

## 2018-08-08 DIAGNOSIS — I10 Essential (primary) hypertension: Secondary | ICD-10-CM | POA: Diagnosis not present

## 2018-08-08 DIAGNOSIS — D631 Anemia in chronic kidney disease: Secondary | ICD-10-CM | POA: Diagnosis not present

## 2018-08-08 DIAGNOSIS — M549 Dorsalgia, unspecified: Secondary | ICD-10-CM | POA: Diagnosis not present

## 2018-08-08 DIAGNOSIS — E1122 Type 2 diabetes mellitus with diabetic chronic kidney disease: Secondary | ICD-10-CM | POA: Diagnosis not present

## 2018-08-08 DIAGNOSIS — Z79899 Other long term (current) drug therapy: Secondary | ICD-10-CM | POA: Diagnosis not present

## 2018-08-08 DIAGNOSIS — Z992 Dependence on renal dialysis: Secondary | ICD-10-CM | POA: Diagnosis not present

## 2018-08-08 DIAGNOSIS — M62830 Muscle spasm of back: Secondary | ICD-10-CM | POA: Diagnosis not present

## 2018-08-08 MED ORDER — GLUCOSE BLOOD VI STRP: ORAL_STRIP | 0 refills | Status: AC

## 2018-08-08 MED ORDER — PREGABALIN 50 MG PO CAPS: 50.0000 mg | ORAL_CAPSULE | Freq: Every day | ORAL | 0 refills | Status: AC

## 2018-08-08 MED ORDER — ACCU-CHEK FASTCLIX LANCETS MISC: 0 refills | Status: AC

## 2018-08-08 NOTE — Telephone Encounter (Signed)
Fax from [facility] stated RX needs dx code (which I have include); also prescriber is not listed on medicare enrollment file. I will send request to the Attending. Also inform Doris S.

## 2018-08-08 NOTE — Assessment & Plan Note (Signed)
Patient states he has not been able to sleep due to SOB. Saturation at 100% and 96% but placed on O2 for comfort. Requesting home O2 as he had this previously. Ambulated patient without O2 and saturation dropped to 92%. He states he often has SOB when he lays flat at home and has recently had difficulty sleeping. He was unable to go to dialysis yesterday due to diarrhea after taking a laxative. He has crackles and course breath sounds bibasilar but does not want to be admitted for dialysis and states he will make sure to go tomorrow. He also has had nausea and vomiting the past couple of days and states dialysis makes his nausea worse. He makes very little urine so lasix would not help at this time. He also has history of mitral valve replacement and last TEE 10/21 showed moderate stenosis. He has been told he needs to follow-up with his surgeon at [facility] to be assessed for possible surgical intervention but has not yet made an appointment.  - f/u in one week  - advised to make sure to go to dialysis tomorrow - instructed to go to ED if SOB increases

## 2018-08-08 NOTE — Assessment & Plan Note (Addendum)
States he has been having severe burning in his feet bilaterally making it difficult to walk. Previously on lyrica which he states helped his neuropathy. He also has a healing ulcer of the 1st inferior metatarsal of his left foot which is scabbed over at this time. We discussed making sure to keep this area clean and dry to keep the wound from reopening.   - lyrica 50 mg qhs - f/u two weeks  ADDENDUM: spoke with patient that incorrect dose sent (50 mg tid sent) although he had already picked it up from the pharmacy. Explained again that he should only be taking this medication qhs as it could potentially make him dizzy or confused. Patient stated he understood and would take it no more than once per day.    - please reiterate at follow-up that patient should only be taking medication once per day

## 2018-08-08 NOTE — Assessment & Plan Note (Signed)
Chronic back pain that is acutely worse. Patient states he cannot sleep and has constant pain. He requests flexaril which helped previously. He has a history of presenting with AMS last year, and his flexaril and lyrica were held due to this. Will give a small amount of flexaril to help with pain today.   - flexaril 5 mg 3 tablets q6h

## 2018-08-08 NOTE — Assessment & Plan Note (Signed)
Patient missed dialysis yesterday due to diarrhea from laxative he'd taken for constipation. He has been having SOB which is making sleeping difficult. He is 9 lbs up since hospital discharge two days ago although he states this is from having his keys and phone in his pocket. On ambulating with O2 his saturation dropped to 92%. As he did not want to be admitted, we discussed that he needed to make sure to attend dialysis tomorrow and that if he was feeling SOB it would help to prop up with pillows or sleep in a recliner for the evening.   Update: Spoke with patient one day later, and he has gone to dialysis.

## 2018-08-10 ENCOUNTER — Emergency Department (HOSPITAL_COMMUNITY): Payer: Medicare Other

## 2018-08-10 ENCOUNTER — Inpatient Hospital Stay (HOSPITAL_COMMUNITY)
Admission: EM | Admit: 2018-08-10 | Discharge: 2018-08-11 | DRG: 640 | Discharge status: Against Medical Advice | Payer: Medicare Other | Attending: Internal Medicine | Admitting: Internal Medicine

## 2018-08-10 ENCOUNTER — Other Ambulatory Visit: Payer: Self-pay

## 2018-08-10 ENCOUNTER — Encounter (HOSPITAL_COMMUNITY): Payer: Self-pay

## 2018-08-10 DIAGNOSIS — M62830 Muscle spasm of back: Secondary | ICD-10-CM | POA: Diagnosis present

## 2018-08-10 DIAGNOSIS — I132 Hypertensive heart and chronic kidney disease with heart failure and with stage 5 chronic kidney disease, or end stage renal disease: Secondary | ICD-10-CM | POA: Diagnosis not present

## 2018-08-10 DIAGNOSIS — E875 Hyperkalemia: Secondary | ICD-10-CM

## 2018-08-10 DIAGNOSIS — D631 Anemia in chronic kidney disease: Secondary | ICD-10-CM | POA: Diagnosis not present

## 2018-08-10 DIAGNOSIS — Z951 Presence of aortocoronary bypass graft: Secondary | ICD-10-CM

## 2018-08-10 DIAGNOSIS — R011 Cardiac murmur, unspecified: Secondary | ICD-10-CM | POA: Diagnosis not present

## 2018-08-10 DIAGNOSIS — I5032 Chronic diastolic (congestive) heart failure: Secondary | ICD-10-CM | POA: Diagnosis not present

## 2018-08-10 DIAGNOSIS — I2510 Atherosclerotic heart disease of native coronary artery without angina pectoris: Secondary | ICD-10-CM | POA: Diagnosis present

## 2018-08-10 DIAGNOSIS — R0789 Other chest pain: Secondary | ICD-10-CM | POA: Diagnosis not present

## 2018-08-10 DIAGNOSIS — R079 Chest pain, unspecified: Secondary | ICD-10-CM | POA: Diagnosis not present

## 2018-08-10 DIAGNOSIS — Z952 Presence of prosthetic heart valve: Secondary | ICD-10-CM

## 2018-08-10 DIAGNOSIS — Z955 Presence of coronary angioplasty implant and graft: Secondary | ICD-10-CM

## 2018-08-10 DIAGNOSIS — I050 Rheumatic mitral stenosis: Secondary | ICD-10-CM | POA: Diagnosis present

## 2018-08-10 DIAGNOSIS — E785 Hyperlipidemia, unspecified: Secondary | ICD-10-CM | POA: Diagnosis present

## 2018-08-10 DIAGNOSIS — G8929 Other chronic pain: Secondary | ICD-10-CM | POA: Diagnosis present

## 2018-08-10 DIAGNOSIS — Z8673 Personal history of transient ischemic attack (TIA), and cerebral infarction without residual deficits: Secondary | ICD-10-CM | POA: Diagnosis not present

## 2018-08-10 DIAGNOSIS — Z833 Family history of diabetes mellitus: Secondary | ICD-10-CM | POA: Diagnosis not present

## 2018-08-10 DIAGNOSIS — Z7982 Long term (current) use of aspirin: Secondary | ICD-10-CM | POA: Diagnosis not present

## 2018-08-10 DIAGNOSIS — R0902 Hypoxemia: Secondary | ICD-10-CM | POA: Diagnosis not present

## 2018-08-10 DIAGNOSIS — J811 Chronic pulmonary edema: Secondary | ICD-10-CM | POA: Diagnosis not present

## 2018-08-10 DIAGNOSIS — E8770 Fluid overload, unspecified: Principal | ICD-10-CM | POA: Diagnosis present

## 2018-08-10 DIAGNOSIS — I120 Hypertensive chronic kidney disease with stage 5 chronic kidney disease or end stage renal disease: Secondary | ICD-10-CM | POA: Diagnosis not present

## 2018-08-10 DIAGNOSIS — Z992 Dependence on renal dialysis: Secondary | ICD-10-CM | POA: Diagnosis not present

## 2018-08-10 DIAGNOSIS — J189 Pneumonia, unspecified organism: Secondary | ICD-10-CM | POA: Diagnosis not present

## 2018-08-10 DIAGNOSIS — Z87891 Personal history of nicotine dependence: Secondary | ICD-10-CM | POA: Diagnosis not present

## 2018-08-10 DIAGNOSIS — E1122 Type 2 diabetes mellitus with diabetic chronic kidney disease: Secondary | ICD-10-CM | POA: Diagnosis present

## 2018-08-10 DIAGNOSIS — N2581 Secondary hyperparathyroidism of renal origin: Secondary | ICD-10-CM | POA: Diagnosis present

## 2018-08-10 DIAGNOSIS — K219 Gastro-esophageal reflux disease without esophagitis: Secondary | ICD-10-CM | POA: Diagnosis not present

## 2018-08-10 DIAGNOSIS — Z9981 Dependence on supplemental oxygen: Secondary | ICD-10-CM | POA: Diagnosis not present

## 2018-08-10 DIAGNOSIS — J9601 Acute respiratory failure with hypoxia: Secondary | ICD-10-CM

## 2018-08-10 DIAGNOSIS — Z79899 Other long term (current) drug therapy: Secondary | ICD-10-CM | POA: Diagnosis not present

## 2018-08-10 DIAGNOSIS — E1151 Type 2 diabetes mellitus with diabetic peripheral angiopathy without gangrene: Secondary | ICD-10-CM | POA: Diagnosis present

## 2018-08-10 DIAGNOSIS — N186 End stage renal disease: Secondary | ICD-10-CM | POA: Diagnosis present

## 2018-08-10 DIAGNOSIS — R0602 Shortness of breath: Secondary | ICD-10-CM

## 2018-08-10 DIAGNOSIS — Z743 Need for continuous supervision: Secondary | ICD-10-CM | POA: Diagnosis not present

## 2018-08-10 LAB — CBC WITH DIFFERENTIAL/PLATELET
ABS IMMATURE GRANULOCYTES: 0.04 10*3/uL (ref 0.00–0.07)
BASOS PCT: 0 %
Basophils Absolute: 0 10*3/uL (ref 0.0–0.1)
Eosinophils Absolute: 0.5 10*3/uL (ref 0.0–0.5)
Eosinophils Relative: 5 %
HCT: 32.5 % — ABNORMAL LOW (ref 39.0–52.0)
Hemoglobin: 9.5 g/dL — ABNORMAL LOW (ref 13.0–17.0)
IMMATURE GRANULOCYTES: 0 %
Lymphocytes Relative: 6 %
Lymphs Abs: 0.6 10*3/uL — ABNORMAL LOW (ref 0.7–4.0)
MCH: 24.4 pg — AB (ref 26.0–34.0)
MCHC: 29.2 g/dL — ABNORMAL LOW (ref 30.0–36.0)
MCV: 83.3 fL (ref 80.0–100.0)
Monocytes Absolute: 0.7 10*3/uL (ref 0.1–1.0)
Monocytes Relative: 7 %
NEUTROS ABS: 8.1 10*3/uL — AB (ref 1.7–7.7)
NEUTROS PCT: 82 %
PLATELETS: 184 10*3/uL (ref 150–400)
RBC: 3.9 MIL/uL — AB (ref 4.22–5.81)
RDW: 18.4 % — ABNORMAL HIGH (ref 11.5–15.5)
WBC: 10 10*3/uL (ref 4.0–10.5)
nRBC: 0 % (ref 0.0–0.2)

## 2018-08-10 LAB — BASIC METABOLIC PANEL
Anion gap: 12 (ref 5–15)
BUN: 47 mg/dL — AB (ref 6–20)
CALCIUM: 8.6 mg/dL — AB (ref 8.9–10.3)
CO2: 27 mmol/L (ref 22–32)
CREATININE: 10.5 mg/dL — AB (ref 0.61–1.24)
Chloride: 102 mmol/L (ref 98–111)
GFR calc Af Amer: 5 mL/min — ABNORMAL LOW (ref >60)
GFR calc non Af Amer: 5 mL/min — ABNORMAL LOW (ref >60)
Glucose, Bld: 82 mg/dL (ref 70–99)
Potassium: 7.1 mmol/L (ref 3.5–5.1)
Sodium: 141 mmol/L (ref 135–145)

## 2018-08-10 LAB — TROPONIN I: TROPONIN I: 0.07 ng/mL — AB (ref <0.03)

## 2018-08-10 MED ORDER — INSULIN ASPART 100 UNIT/ML IV SOLN: 5.0000 [IU] | Freq: Once | INTRAVENOUS | Status: DC

## 2018-08-10 MED ORDER — ISOSORBIDE MONONITRATE ER 30 MG PO TB24
30.0000 mg | ORAL_TABLET | Freq: Every day | ORAL | Status: DC
  Filled 2018-08-10: qty 1

## 2018-08-10 MED ORDER — ROPINIROLE HCL 0.25 MG PO TABS
0.2500 mg | ORAL_TABLET | Freq: Every day | ORAL | Status: DC
  Filled 2018-08-10: qty 1

## 2018-08-10 MED ORDER — ACETAMINOPHEN 325 MG PO TABS: 650.0000 mg | ORAL_TABLET | Freq: Once | ORAL | Status: DC

## 2018-08-10 MED ORDER — CALCIUM GLUCONATE 10 % IV SOLN
1.0000 g | Freq: Once | INTRAVENOUS | Status: AC
  Administered 2018-08-10: 1 g via INTRAVENOUS
  Filled 2018-08-10: qty 10

## 2018-08-10 MED ORDER — DEXTROSE 50 % IV SOLN
1.0000 | Freq: Once | INTRAVENOUS | Status: AC
  Administered 2018-08-10: 50 mL via INTRAVENOUS
  Filled 2018-08-10: qty 50

## 2018-08-10 MED ORDER — RENA-VITE PO TABS
1.0000 | ORAL_TABLET | Freq: Every day | ORAL | Status: DC
  Filled 2018-08-10: qty 1

## 2018-08-10 MED ORDER — INSULIN ASPART 100 UNIT/ML ~~LOC~~ SOLN
5.0000 [IU] | Freq: Once | SUBCUTANEOUS | Status: AC
  Administered 2018-08-10: 5 [IU] via INTRAVENOUS
  Filled 2018-08-10: qty 1

## 2018-08-10 MED ORDER — AMLODIPINE BESYLATE 10 MG PO TABS
10.0000 mg | ORAL_TABLET | Freq: Every day | ORAL | Status: DC
  Filled 2018-08-10: qty 1

## 2018-08-10 MED ORDER — PREGABALIN 50 MG PO CAPS: 50.0000 mg | ORAL_CAPSULE | Freq: Every day | ORAL | Status: DC

## 2018-08-10 MED ORDER — ALBUTEROL SULFATE (2.5 MG/3ML) 0.083% IN NEBU: 3.0000 mL | INHALATION_SOLUTION | Freq: Four times a day (QID) | RESPIRATORY_TRACT | Status: DC | PRN

## 2018-08-10 MED ORDER — ATORVASTATIN CALCIUM 20 MG PO TABS: 20.0000 mg | ORAL_TABLET | Freq: Every day | ORAL | Status: DC

## 2018-08-10 MED ORDER — SODIUM CHLORIDE 0.9 % IV SOLN
1.0000 g | Freq: Once | INTRAVENOUS | Status: AC
  Administered 2018-08-10: 1 g via INTRAVENOUS
  Filled 2018-08-10: qty 1

## 2018-08-10 MED ORDER — ASPIRIN EC 81 MG PO TBEC
81.0000 mg | DELAYED_RELEASE_TABLET | Freq: Every day | ORAL | Status: DC
  Filled 2018-08-10: qty 1

## 2018-08-10 MED ORDER — ALBUTEROL SULFATE (2.5 MG/3ML) 0.083% IN NEBU
10.0000 mg | INHALATION_SOLUTION | Freq: Once | RESPIRATORY_TRACT | Status: DC
  Filled 2018-08-10: qty 12

## 2018-08-10 MED ORDER — FENTANYL CITRATE (PF) 100 MCG/2ML IJ SOLN
100.0000 ug | Freq: Once | INTRAMUSCULAR | Status: AC
  Administered 2018-08-10: 100 ug via INTRAVENOUS
  Filled 2018-08-10: qty 2

## 2018-08-10 MED ORDER — TRAMADOL HCL 50 MG PO TABS: 50.0000 mg | ORAL_TABLET | Freq: Four times a day (QID) | ORAL | Status: DC | PRN

## 2018-08-10 MED ORDER — CHLORHEXIDINE GLUCONATE CLOTH 2 % EX PADS: 6.0000 | MEDICATED_PAD | Freq: Every day | CUTANEOUS | Status: DC

## 2018-08-10 MED ORDER — LISINOPRIL 10 MG PO TABS
10.0000 mg | ORAL_TABLET | Freq: Every day | ORAL | Status: DC
  Filled 2018-08-10: qty 1

## 2018-08-10 MED ORDER — DEXTROSE 10 % IV SOLN: Freq: Once | INTRAVENOUS | Status: DC

## 2018-08-10 MED ORDER — INSULIN ASPART 100 UNIT/ML ~~LOC~~ SOLN
SUBCUTANEOUS | Status: AC
  Filled 2018-08-10: qty 1

## 2018-08-10 MED ORDER — VANCOMYCIN HCL 10 G IV SOLR
2000.0000 mg | Freq: Once | INTRAVENOUS | Status: AC
  Administered 2018-08-11: 2000 mg via INTRAVENOUS
  Filled 2018-08-10: qty 2000

## 2018-08-10 MED ORDER — SODIUM CHLORIDE 0.9% FLUSH: 3.0000 mL | Freq: Two times a day (BID) | INTRAVENOUS | Status: DC

## 2018-08-10 MED ORDER — SEVELAMER CARBONATE 800 MG PO TABS
3200.0000 mg | ORAL_TABLET | Freq: Three times a day (TID) | ORAL | Status: DC
  Filled 2018-08-10: qty 4

## 2018-08-10 MED ORDER — CINACALCET HCL 30 MG PO TABS: 180.0000 mg | ORAL_TABLET | ORAL | Status: DC

## 2018-08-10 MED ORDER — HEPARIN SODIUM (PORCINE) 5000 UNIT/ML IJ SOLN: 5000.0000 [IU] | Freq: Three times a day (TID) | INTRAMUSCULAR | Status: DC

## 2018-08-10 NOTE — ED Notes (Signed)
edp talk to pt, nurse starting IV and will draw labs.

## 2018-08-10 NOTE — ED Provider Notes (Signed)
Patient signed out from Dr. Regenia Skeeter at end of shift. H/o ESRD-HD (MWF), last dialyzed Friday, ie due tomorrow Back and chest pain, right (chronic) worse 80% O2 saturation per EMS - better with 2L Not on home O2 +Cough - CXR = edema RLQ PNA (HCAP)  Plan - needs admission but is reluctant If urgent dialysis, consult renal Either way, admit to medicine (internal med) if patient is willing to stay.  After last discussion, the patient agrees to be admitted. Discussed with Dr. Posey Pronto, nephrology, who will attend to needed dialysis. Internal medicine admitting resident paged for admission to hospital.   [facility] to admit. Appreciate their help with his care.      Charlann Lange, PA-C 08/10/18 2303    Noemi Chapel, MD 08/12/18 1215

## 2018-08-10 NOTE — ED Notes (Signed)
Pt refuse blood draw,  Notified nurse and provider.

## 2018-08-10 NOTE — Progress Notes (Signed)
Pharmacy Antibiotic Note  [name] [name] is a 62 y.o. male admitted on 08/10/2018 with pneumonia.  Pharmacy has been consulted for vancomycin dosing. Cefepime 1 gm is ordered in ED He is noted with ESRD on HD  Plan: -Vancomycin 2000mg  IV x1 then follow HD plans -Will follow plans for cefepime -Will follow  cultures and clinical progress   Temp (24hrs), Avg:98.3 F (36.8 C), Min:98.3 F (36.8 C), Max:98.3 F (36.8 C)  Recent Labs  Lab 08/04/18 0229 08/04/18 1401 08/05/18 0438 08/10/18 2104  WBC 9.8 8.6  --  10.0  CREATININE 10.07* 11.45* 6.61*  --     Estimated Creatinine Clearance: 13 mL/min (A) (by C-G formula based on SCr of 6.61 mg/dL (H)).    Allergies  Allergen Reactions  . Enalapril Hives and Rash  . Latex Rash  . Tape Rash and Other (See Comments)    TAPE MAKES SKIN BREAK OUT AND TURN RED     Thank you for allowing pharmacy to be a part of this patient's care.  Hildred Laser, PharmD Clinical Pharmacist **Pharmacist phone directory can now be found on [email or web address] ([email or web address]).  Listed under [place].

## 2018-08-10 NOTE — ED Triage Notes (Signed)
To room via EMS.  Pt woke up around 4pm with shortness of breath, 1/2 hour later started having pain in right flank that radiates to right side of abd.  O2 sats when fire arrived was in the 80's on room air, O2 @ 4L applied, SpO2 increased to 94%.  The flank pain has been going on around 1 year and it is getting worse.  Pt states that this pain has never been evaluated since he ends up getting other workups when he presents to ED.    Pt does dialysis M-W-F, last dialysis was Friday.  NO swelling noted.    Pt went to PCP 3 days ago with shortness of breath, requesting oxygen.    BP 188/96 CGG 98 EKG ST

## 2018-08-10 NOTE — ED Provider Notes (Signed)
[facility] EMERGENCY DEPARTMENT Provider Note   CSN: [id number] Arrival date & time: 08/10/18  1937     History   Chief Complaint Chief Complaint  Patient presents with  . Shortness of Breath  . Flank Pain    HPI [name] [name] is a 62 y.o. male.  HPI  62 year old male with a history of chronic diastolic CHF and end-stage renal disease on dialysis presents with shortness of breath.  The patient states that he has been having shortness of breath and feeling like he needs oxygen for 3 days.  He went to dialysis 2 days ago and had a full session.  No cough.  Tonight he had an episode of right-sided back pain that went to his right chest.  He has been having this pain on and off for many years.  It is chronic.  A little worse tonight but now much better.  He was noted to have O2 sat in the 80s and was put on 4 L by EMS.  He does not have home O2 at this time. No leg swelling. No abdominal pain.  Past Medical History:  Diagnosis Date  . Anemia   . Anginal pain ([place])   . CAD (coronary artery disease)    a. per [facility] s/p 3.39mm x 27mm Vision BMS to mid LAD 12/2009 and Xience DES to mid LAD 10/2010.  [name] Chronic diastolic CHF (congestive heart failure) ([place])   . Colon polyps   . Daily headache   . ESRD on dialysis [facility]) since ~ 2008   "MWF; Jeneen Rinks" (03/04/2017)  . GERD (gastroesophageal reflux disease)   . H/O TIA (transient ischemic attack) 04/01/2015  . H/O TIA (transient ischemic attack) 04/01/2015  . Heart murmur   . Hematochezia    a. 2014: colonscopy, which showed moderately-sized internal hemorrhoids, two 63mm polyps in transverse colon and ascending colon that were resected, five 2-79mm polyps in sigmoid colon, descending colon, transverse colon, and ascending colon that were resected. An upper endoscopy was performed and showed normal esophagus, stomach, and duodenum.  . Hematuria    a. H/o hematuria 2014 with cystoscopy that was unrevealing for his  source of hematuria. He underwent a kidney ultrasound on 10/14 that showed mildly echogenic and scarred kidneys compatible with medical renal disease, without hydronephrosis or renal calculi.  [name] History of blood transfusion    "had colonoscopy done; they had to give me some blood"  . History of kidney stones   . Hyperlipidemia   . Hypertension   . On home oxygen therapy    "2L prn" (07/21/2015); "been off it for awhile" (03/04/2017)  . Pneumonia   . Renal insufficiency   . 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 Type II diabetes mellitus ([place])   . Wears dentures     Patient Active Problem List   Diagnosis Date Noted  . Pulmonary edema with congestive heart failure ([place]) 08/04/2018  . Hypoglycemia 07/24/2018  . Muscle strain of right upper back 07/22/2018  . Shortness of breath 07/21/2018  . Mitral stenosis 07/21/2018  . Shoulder pain, right 07/10/2018  . Pleural effusion on left   . ESRD on dialysis ([place])   . SOB (shortness of breath) 06/06/2018  . Critical lower limb ischemia 05/27/2018  . Hx of CABG 02/02/2018  . Hypervolemia associated with renal insufficiency 01/26/2018  . Restless leg syndrome 01/14/2018  . Anemia 01/14/2018  . Tobacco use 10/29/2017  .  Mild cognitive impairment 06/13/2017  . Chest pain 06/07/2017  . Skin ulcer of toe of right foot, limited to breakdown of skin ([place])   . Callus of foot 03/07/2017  . Pulmonary hypertension ([place]) 07/28/2016  . Stroke-like symptoms 06/30/2016  . Peripheral neuropathy 05/07/2016  . S/P coronary artery stent placement 05/07/2016  . Nausea and vomiting 04/02/2016  . ESRD (end stage renal disease) ([place]) 03/18/2016  . Mitral regurgitation   . Acute pulmonary edema (HCC)   . Diabetic neuropathy ([place]) 07/29/2015  . Depression 07/22/2015  . Elevated troponin 06/04/2015  . GERD (gastroesophageal reflux disease) 06/04/2015  . Malnutrition of moderate degree ([place]) 05/17/2015  . Thrombocytopenia ([place])  05/16/2015  . Weight loss 05/16/2015  . Diastolic dysfunction-grade 2 12/13/2014  . 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 Arm pain, right 05/25/2014  . Numbness 05/25/2014  . Hypertension 04/27/2014  . CAD -S/P LAD BMS 2011, LAD DES 2012- patent cors Feb 2016 04/27/2014  . Diabetes mellitus type 2, diet-controlled ([place]) 04/27/2014  . Background diabetic retinopathy ([place]) 08/13/2012  . Primary localized osteoarthrosis, lower leg 04/15/2012  . Senile nuclear sclerosis 11/29/2010  . Vitreous hemorrhage ([place]) 11/29/2010  . Atherosclerotic heart disease of native coronary artery without angina pectoris 12/12/2009  . Hypercholesterolemia 11/30/2003    Past Surgical History:  Procedure Laterality Date  . ABDOMINAL AORTOGRAM W/LOWER EXTREMITY N/A 05/29/2018   Procedure: ABDOMINAL AORTOGRAM W/LOWER EXTREMITY;  Surgeon: Lorretta Harp, MD;  Location: [facility] CV LAB;  Service: Cardiovascular;  Laterality: N/A;  . AV FISTULA PLACEMENT Left ~ 2007   "upper arm"  . CARDIAC CATHETERIZATION  "several"  . CORONARY ANGIOPLASTY WITH STENT PLACEMENT  "several"  . CORONARY ARTERY BYPASS GRAFT     3 grafts  . CYSTOSCOPY W/ STONE MANIPULATION  X2?  . EYE SURGERY Bilateral    "laser OR for hemorrhage"  . IR THORACENTESIS ASP PLEURAL SPACE W/IMG GUIDE  07/09/2018  . LEFT HEART CATHETERIZATION WITH CORONARY ANGIOGRAM N/A 11/23/2014   Procedure: LEFT HEART CATHETERIZATION WITH CORONARY ANGIOGRAM;  Surgeon: Troy Sine, MD;  Location: [facility] CATH LAB;  Service: Cardiovascular;  Laterality: N/A;  . LITHOTRIPSY  X1  . MITRAL VALVE REPLACEMENT    . REVISON OF ARTERIOVENOUS FISTULA Left 08/65/7846   Procedure: PLICATION OF LEFT ARM ARTERIOVENOUS FISTULA;  Surgeon: Angelia Mould, MD;  Location: [facility];  Service: Vascular;  Laterality: Left;  . REVISON OF ARTERIOVENOUS FISTULA Left 9/62/9528   Procedure: PLICATE ANEURYSM  OF LEFT ARTERIOVENOUS FISTULA;  Surgeon: Waynetta Sandy,  MD;  Location: [facility];  Service: Vascular;  Laterality: Left;  . TEE WITHOUT CARDIOVERSION N/A 07/23/2018   Procedure: TRANSESOPHAGEAL ECHOCARDIOGRAM (TEE);  Surgeon: Sueanne Margarita, MD;  Location: [facility] ENDOSCOPY;  Service: Cardiovascular;  Laterality: N/A;        Home Medications    Prior to Admission medications   Medication Sig Start Date End Date Taking? Authorizing Provider  albuterol (PROVENTIL HFA;VENTOLIN HFA) 108 (90 Base) MCG/ACT inhaler Inhale 2 puffs into the lungs every 6 (six) hours as needed for wheezing or shortness of breath. 06/07/18  Yes Neva Seat, MD  amLODipine (NORVASC) 10 MG tablet Take 1 tablet (10 mg total) by mouth at bedtime. 04/17/18  Yes Neva Seat, MD  aspirin EC 81 MG tablet Take 81 mg by mouth daily.   Yes [provider]  atorvastatin (LIPITOR) 20 MG tablet Take 1 tablet (20 mg total) by mouth at bedtime. 07/31/18  Yes Neva Seat, MD  cinacalcet (  SENSIPAR) 90 MG tablet Take 180 mg by mouth every Monday, Wednesday, and Friday with hemodialysis.    Yes [provider]  cyclobenzaprine (FLEXERIL) 5 MG tablet Take 1 tablet (5 mg total) by mouth 3 (three) times daily as needed for muscle spasms. 08/07/18  Yes Seawell, Jaimie A, DO  isosorbide mononitrate (IMDUR) 30 MG 24 hr tablet Take 1 tablet (30 mg total) by mouth daily. 07/10/18 07/10/19 Yes Neva Seat, MD  lisinopril (PRINIVIL,ZESTRIL) 10 MG tablet Take 1 tablet (10 mg total) by mouth daily. 07/10/18  Yes Neva Seat, MD  multivitamin (RENA-VIT) TABS tablet TAKE 1 TABLET BY MOUTH ONCE DAILY AT BEDTIME Patient taking differently: Take 1 tablet by mouth daily.  07/02/18  Yes Neva Seat, MD  nitroGLYCERIN (NITROSTAT) 0.4 MG SL tablet Place 1 tablet (0.4 mg total) under the tongue every 5 (five) minutes as needed for chest pain. 07/10/18  Yes Neva Seat, MD  ondansetron (ZOFRAN) 4 MG tablet Take 1 tablet (4 mg total) by mouth 4 (four) times daily as needed  for nausea or vomiting. 03/04/18  Yes Wynona Luna, MD  pregabalin (LYRICA) 50 MG capsule Take 1 capsule (50 mg total) by mouth at bedtime. 08/08/18 08/08/19 Yes Seawell, Jaimie A, DO  rOPINIRole (REQUIP) 0.25 MG tablet Take 1 tablet (0.25 mg total) by mouth at bedtime. 02/25/18  Yes Bartholomew Crews, MD  sevelamer carbonate (RENVELA) 800 MG tablet Take 4 tablets (3,200 mg total) by mouth 3 (three) times daily with meals. Patient taking differently: Take 1,600-3,200 mg by mouth See admin instructions. Take 4 tablets (3200 mg) by mouth three times daily with meals and 2 tablets (1600 mg) with snacks 03/12/16  Yes Vann, Jessica U, DO  traMADol (ULTRAM) 50 MG tablet Take 50 mg by mouth every 6 (six) hours as needed for moderate pain.   Yes [provider]  ACCU-CHEK FASTCLIX LANCETS MISC Check up to seven times per week 08/08/18   Joni Reining C, DO  glucose blood (ACCU-CHEK GUIDE) test strip Check up to seven times per week. Dx code E11.65. 08/08/18   Lucious Groves, DO    Family History Family History  Problem Relation Age of Onset  . Bone cancer Mother   . Anuerysm Father   . Hypertension Unknown   . Diabetes type II Daughter   . Ovarian cancer Sister     Social History Social History   Tobacco Use  . Smoking status: Former Smoker    Packs/day: 0.30    Years: 8.00    Pack years: 2.40    Types: Cigars  . Smokeless tobacco: Never Used  . Tobacco comment: black and mild every 3-4 days  Substance Use Topics  . Alcohol use: No    Alcohol/week: 0.0 standard drinks  . Drug use: No     Allergies   Enalapril; Latex; and Tape   Review of Systems Review of Systems  Constitutional: Negative for fever.  Respiratory: Positive for shortness of breath. Negative for cough.   Cardiovascular: Positive for chest pain.  Gastrointestinal: Negative for vomiting.  Musculoskeletal: Positive for back pain.  All other systems reviewed and are negative.    Physical  Exam Updated Vital Signs BP (!) 181/94   Pulse 97   Temp 98.3 F (36.8 C) (Oral)   Resp (!) 24   SpO2 97%   Physical Exam  Constitutional: He appears well-developed and well-nourished.  Non-toxic appearance. He does not appear ill. No distress.  HENT:  Head: Normocephalic  and atraumatic.  Right Ear: External ear normal.  Left Ear: External ear normal.  Nose: Nose normal.  Eyes: Right eye exhibits no discharge. Left eye exhibits no discharge.  Neck: Neck supple.  Cardiovascular: Normal rate, regular rhythm and normal heart sounds.  Pulmonary/Chest: Effort normal. No accessory muscle usage. Tachypnea noted. He has rales in the right lower field and the left lower field.  Abdominal: Soft. There is no tenderness.  Musculoskeletal: He exhibits no edema.       Right lower leg: He exhibits no edema.       Left lower leg: He exhibits no edema.  Neurological: He is alert.  Skin: Skin is warm and dry.  Psychiatric: His mood appears not anxious.  Nursing note and vitals reviewed.    ED Treatments / Results  Labs (all labs ordered are listed, but only abnormal results are displayed) Labs Reviewed  BASIC METABOLIC PANEL - Abnormal; Notable for the following components:      Result Value   Potassium 7.1 (*)    BUN 47 (*)    Creatinine, Ser 10.50 (*)    Calcium 8.6 (*)    GFR calc non Af Amer 5 (*)    GFR calc Af Amer 5 (*)    All other components within normal limits  CBC WITH DIFFERENTIAL/PLATELET - Abnormal; Notable for the following components:   RBC 3.90 (*)    Hemoglobin 9.5 (*)    HCT 32.5 (*)    MCH 24.4 (*)    MCHC 29.2 (*)    RDW 18.4 (*)    Neutro Abs 8.1 (*)    Lymphs Abs 0.6 (*)    All other components within normal limits  CULTURE, BLOOD (ROUTINE X 2)  CULTURE, BLOOD (ROUTINE X 2)  TROPONIN I    EKG EKG Interpretation  Date/Time:  Sunday August 10 2018 19:39:20 EST Ventricular Rate:  107 PR Interval:    QRS Duration: 102 QT Interval:  346 QTC  Calculation: 462 R Axis:   50 Text Interpretation:  Sinus tachycardia Borderline low voltage, extremity leads Probable left ventricular hypertrophy no significant change since Oct 2019 Confirmed by Sherwood Gambler [phone]) on 08/10/2018 9:14:00 PM   Radiology Dg Chest 2 View  Result Date: 08/10/2018 CLINICAL DATA:  Shortness of breath for several hours EXAM: CHEST - 2 VIEW COMPARISON:  08/02/2018 FINDINGS: Cardiac shadow remains enlarged. Postsurgical changes are again noted. Patchy infiltrative changes are seen in the right lung base new from the prior exam. Stable left pleural effusion laterally is seen. Vascular congestion is noted although mildly improved when compared with the prior study. IMPRESSION: Changes of CHF mildly improved from the prior study. New right basilar infiltrate. Electronically Signed   By: Inez Catalina M.D.   On: 08/10/2018 21:30    Procedures .Critical Care Performed by: Sherwood Gambler, MD Authorized by: Sherwood Gambler, MD   Critical care provider statement:    Critical care time (minutes):  30   Critical care time was exclusive of:  Separately billable procedures and treating other patients   Critical care was necessary to treat or prevent imminent or life-threatening deterioration of the following conditions:  Metabolic crisis and respiratory failure   Critical care was time spent personally by me on the following activities:  Development of treatment plan with patient or surrogate, discussions with consultants, evaluation of patient's response to treatment, examination of patient, obtaining history from patient or surrogate, ordering and performing treatments and interventions, ordering and review of laboratory  studies, ordering and review of radiographic studies, pulse oximetry, re-evaluation of patient's condition and review of old charts   (including critical care time)  Medications Ordered in ED Medications  ceFEPIme (MAXIPIME) 1 g in sodium chloride 0.9 %  100 mL IVPB (has no administration in time range)  acetaminophen (TYLENOL) tablet 650 mg (650 mg Oral Refused 08/10/18 2156)  vancomycin (VANCOCIN) 2,000 mg in sodium chloride 0.9 % 500 mL IVPB (has no administration in time range)  fentaNYL (SUBLIMAZE) injection 100 mcg (has no administration in time range)     Initial Impression / Assessment and Plan / ED Course  I have reviewed the triage vital signs and the nursing notes.  Pertinent labs & imaging results that were available during my care of the patient were reviewed by me and considered in my medical decision making (see chart for details).  Clinical Course as of Aug 10 2228  Nancy Fetter Aug 10, 2018  2143 Patient originally said no cough but when I went back to ask after chest x-ray he does endorse a cough for about 1 week.  Given this in combination with the chest x-ray findings I am concerned for pneumonia, especially with his hypoxia that seems to be new.  Thus I will start him on IV antibiotics.   [SG]    Clinical Course User Index [SG] Sherwood Gambler, MD    Patient appears to have pneumonia in combination with pulmonary edema as the cause of his new hypoxia.  He is having tachypnea as well although he is able to speak in complete sentences.  No respiratory distress.  Patient initially does not want any testing and after discussing, he finally relented to chest x-ray and labs.  He also states he does not want to be admitted, despite him being now oxygen dependent.  I discussed possible deleterious effects of leaving AMA while hypoxic, including death, stroke, heart attack, pulmonary failure, and organ failure.  He does agree to stay only if he needs emergent dialysis.  He was found to have potassium of 7.1 on his BMP.  Thus I discussed the importance of treating this and consulting with nephrology and admitting to the internal medicine teaching service.  Patient agrees. He has some peaked T waves but no other signs of severe hyperkalemia.  Nephrology will be consulted, as well as internal medicine. Care transferred to [facility]. Will treat with insulin, glucose, albuterol and calcium.  Final Clinical Impressions(s) / ED Diagnoses   Final diagnoses:  HCAP (healthcare-associated pneumonia)  Acute respiratory failure with hypoxia [facility])  Hyperkalemia    ED Discharge Orders    None       Sherwood Gambler, MD 08/10/18 2233

## 2018-08-10 NOTE — ED Notes (Signed)
Pt refused blood draw for blood cultures,  Notified RN and provider.

## 2018-08-10 NOTE — H&P (Addendum)
Date: 08/10/2018               Patient Name:  [name] [name] MRN: [id number]  DOB: [birth year] Age / Sex: 62 y.o., male   PCP: Neva Seat, MD         Medical Service: Internal Medicine Teaching Service         Attending Physician: Dr. Rebeca Alert Raynaldo Opitz, MD    First Contact: Dr. Myrtie Hawk  Pager: [phone]  Second Contact: Dr. Tarri Abernethy Pager: [phone]       After Hours (After 5p/  First Contact Pager: [phone]  weekends / holidays): Second Contact Pager: [phone]   Chief Complaint: Shortness of Breath  History of Present Illness:   [name] [name] is a 62 year old African-American gentleman with medical history significant for chronic diastolic heart failure EF 60 to 55%, ESRD on hemodialysis Monday/Wednesday/Friday, CAD status post CABG, mitral valve replacement, moderate mitral stenosis on TEE 2019, type 2 diabetes mellitus not on medication, GERD, peripheral vascular disease, hypertension presenting for evaluation of shortness of breath.  He reports that he has been experiencing shortness of breath for the past 3 days however it was worse in the evening prior to admission as he was preparing dinner.  He began experiencing gradually worsening shortness of breath with ambulation which improved with rest.  Also reported of diaphoresis, palpitation but denied chest pain, symptoms of angina, orthopnea, paroxysmal nocturnal dyspnea, lower extremity edema.  He states that he has history of chronic cough with no sputum production, denies fevers, chills, rhinorrhea, malaise, lacrimation, sick contacts, abdominal pain.  Patient reports of compliance with hemodialysis and also indicated of chronic nausea and vomiting which typically occurs during his hemodialysis sessions.   Patient states that he has a history of chronic back pain with onset 2 years ago which seems to be manageable with Flexeril but reported of increasing right-sided back pain with radiation to the right upper quadrant prior to  admission.  He was recently admitted at the hospital for shortness of breath secondary to volume overload from ESRD and required hemodialysis as well as treatment with IV Lasix.  Is noted to EDW is 85.3 kg  Chart review, patient was seen at the internal medicine clinic on 08/07/2018 and was requesting for home oxygen.  While at the clinic, resting O2 was 96 to 100% however ambulatory O2 was measured at 92% and he was placed on O2 for comfort.   EMS course: Patient had a recorded O2 saturation of 80% when EMS arrived and was placed on 4 L nasal cannula.  ED course: Afebrile, tachycardic to 108, tachypneic with RR 20s to 30s, BP 186/96, O2 saturation 100% on 2 L nasal cannula, chest x-ray shows new right bibasilar infiltrate, vascular congestion mildly improved from prior studies, EKG showed sinus tachycardia with mildly elevated T waves, BMP consistent with ESRD, hyper kalemia with K+ of 7.1, i-STAT troponin mildly elevated at 0.07, CBC with no evidence of leukocytosis.  Patient received insulin, glucose, calcium gluconate, albuterol and was scheduled for emergent hemodialysis.  He was also started on cefepime and vancomycin for empiric treatment of nosocomial associated pneumonia.  Meds:  Current Meds  Medication Sig  . albuterol (PROVENTIL HFA;VENTOLIN HFA) 108 (90 Base) MCG/ACT inhaler Inhale 2 puffs into the lungs every 6 (six) hours as needed for wheezing or shortness of breath.  [name] amLODipine (NORVASC) 10 MG tablet Take 1 tablet (10 mg total) by mouth at bedtime.  [name] aspirin EC 81 MG tablet Take 81  mg by mouth daily.  [name] atorvastatin (LIPITOR) 20 MG tablet Take 1 tablet (20 mg total) by mouth at bedtime.  . cinacalcet (SENSIPAR) 90 MG tablet Take 180 mg by mouth every Monday, Wednesday, and Friday with hemodialysis.   [name] cyclobenzaprine (FLEXERIL) 5 MG tablet Take 1 tablet (5 mg total) by mouth 3 (three) times daily as needed for muscle spasms.  . isosorbide mononitrate (IMDUR) 30 MG 24 hr tablet  Take 1 tablet (30 mg total) by mouth daily.  [name] lisinopril (PRINIVIL,ZESTRIL) 10 MG tablet Take 1 tablet (10 mg total) by mouth daily.  . multivitamin (RENA-VIT) TABS tablet TAKE 1 TABLET BY MOUTH ONCE DAILY AT BEDTIME (Patient taking differently: Take 1 tablet by mouth daily. )  . nitroGLYCERIN (NITROSTAT) 0.4 MG SL tablet Place 1 tablet (0.4 mg total) under the tongue every 5 (five) minutes as needed for chest pain.  [name] ondansetron (ZOFRAN) 4 MG tablet Take 1 tablet (4 mg total) by mouth 4 (four) times daily as needed for nausea or vomiting.  . pregabalin (LYRICA) 50 MG capsule Take 1 capsule (50 mg total) by mouth at bedtime.  [name] rOPINIRole (REQUIP) 0.25 MG tablet Take 1 tablet (0.25 mg total) by mouth at bedtime.  . sevelamer carbonate (RENVELA) 800 MG tablet Take 4 tablets (3,200 mg total) by mouth 3 (three) times daily with meals. (Patient taking differently: Take 1,600-3,200 mg by mouth See admin instructions. Take 4 tablets (3200 mg) by mouth three times daily with meals and 2 tablets (1600 mg) with snacks)  . traMADol (ULTRAM) 50 MG tablet Take 50 mg by mouth every 6 (six) hours as needed for moderate pain.     Allergies: Allergies as of 08/10/2018 - Review Complete 08/10/2018  Allergen Reaction Noted  . Enalapril Hives and Rash 04/26/2014  . Latex Rash 09/23/2016  . Tape Rash and Other (See Comments) 09/23/2016   Past Medical History:  Diagnosis Date  . Anemia   . Anginal pain ([place])   . CAD (coronary artery disease)    a. per [facility] s/p 3.57mm x 32mm Vision BMS to mid LAD 12/2009 and Xience DES to mid LAD 10/2010.  [name] Chronic diastolic CHF (congestive heart failure) ([place])   . Colon polyps   . Daily headache   . ESRD on dialysis [facility]) since ~ 2008   "MWF; [facility]" (03/04/2017)  . GERD (gastroesophageal reflux disease)   . H/O TIA (transient ischemic attack) 04/01/2015  . H/O TIA (transient ischemic attack) 04/01/2015  . Heart murmur   . Hematochezia    a. 2014:  colonscopy, which showed moderately-sized internal hemorrhoids, two 44mm polyps in transverse colon and ascending colon that were resected, five 2-68mm polyps in sigmoid colon, descending colon, transverse colon, and ascending colon that were resected. An upper endoscopy was performed and showed normal esophagus, stomach, and duodenum.  . Hematuria    a. H/o hematuria 2014 with cystoscopy that was unrevealing for his source of hematuria. He underwent a kidney ultrasound on 10/14 that showed mildly echogenic and scarred kidneys compatible with medical renal disease, without hydronephrosis or renal calculi.  [name] History of blood transfusion    "had colonoscopy done; they had to give me some blood"  . History of kidney stones   . Hyperlipidemia   . Hypertension   . On home oxygen therapy    "2L prn" (07/21/2015); "been off it for awhile" (03/04/2017)  . Pneumonia   . Renal insufficiency   . 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 Type II diabetes mellitus ([place])   . Wears dentures     Family History: Heart disease and diabetes in father  Social History: Denies cigarette, EtOH, illicit drug use  Review of Systems: A complete ROS was negative except as per HPI.   Physical Exam: Blood pressure (!) 187/98, pulse (!) 103, temperature 98.3 F (36.8 C), temperature source Oral, resp. rate (!) 29, SpO2 98 %.  Physical Exam  Constitutional: He is well-developed, well-nourished, and in no distress. No distress.  HENT:  Head: Atraumatic.  Neck: Neck supple.  Cardiovascular: Normal heart sounds. Tachycardia present. Exam reveals no gallop and no friction rub.  No murmur heard. Pulmonary/Chest: Effort normal. Tachypnea noted. No respiratory distress. He has no wheezes. He has rales (bibasilar).  Abdominal: Soft. Bowel sounds are normal. He exhibits no distension. There is no tenderness. There is no rebound.  Musculoskeletal: He exhibits no edema, tenderness or deformity.    Neurological: He is alert.  Skin: Skin is warm. No rash noted. He is not diaphoretic. No erythema. No pallor.  Multiple tattoos  Psychiatric: Mood, memory, affect and judgment normal.    EKG: personally reviewed my interpretation is sinus tachycardia with peaked T waves  CXR: personally reviewed my interpretation is pulmonary vascular congestion, right lower lobe infiltrate  Assessment & Plan by Problem: Active Problems:   SOB (shortness of breath)  Mr. Gellner is a 62 year old African-American gentleman with medical history significant for chronic diastolic heart failure EF 60 to 55%, ESRD on hemodialysis Monday/Wednesday/Friday, CAD status post CABG, mitral valve replacement, moderate mitral stenosis on TEE 2019, type 2 diabetes mellitus not on medication, GERD, peripheral vascular disease, hypertension here for management of shortness of breath and hyperkalemia.  Acute hypoxic respiratory failure secondary to pulmonary edema vs ? Healthcare associated pneumonia: Presents with a 3-day history of worsening dyspnea on exertion with associated palpitation however denies lower extremity edema, orthopnea, paroxysmal nocturnal dyspnea, chest pain, angina symptoms, new onset cough, fever, chills, new onset nausea, vomiting.  Recently evaluated at the clinic with similar symptoms however resting and ambulatory oxygen saturation were reassuring.  He was noted to have a 9 pound increase in weight though he attributed this to his keys and forms pocket.  His estimated dry weight is 85.3 kg and recent discharge weight was measured at 83.9 kg.  The patient noted to have desaturated O2 saturation of 80% by EMS however this improved with 4 L nasal cannula on route and 2 L nasal cannula in the ED.  Chest x-ray reviewed pulmonary vascular congestion and new right lower lobe infiltrate.  CBC without leukocytosis.  -s/p cefepime, vancomycin -Follow-up a.m. CBC, blood cultures -Continue Supplemental O2 with 2L  [place] -Ambulatory O2 in am -Repeat chest x-ray following hemodialysis  ESRD on HD MWF Hyperkalemia Reports compliance with hemodialysis.  BMP on admission revealed hyperkalemia with K+ of 7.1, EKG showed peaked T waves.  Patient received insulin, glucose, calcium gluconate and albuterol.  Also arrange for urgent hemodialysis.  Estimated dry weight of 85.3 kg (last discharge weight measured at 83.9 kg) -Follow-up a.m. BMP -Continue cinacalcet -Continuous cardiac monitoring  Chronic diastolic heart failure: TTE on 07/23/2018 with EF 60 to 65%.  Doubt patient's symptoms of shortness of breath secondary to heart failure exacerbation as he denies orthopnea, proximal nocturnal dyspnea, lower extremity edema.  Though troponin is mildly elevated at 0.07, he denies chest pain -Repeat troponin in a.m. -Follow-up chest x-ray -Strict I's  and O's  Hypertension: BP elevated to 180s/90s on admission.  Will start amlodipine and hold lisinopril pending hemodialysis in a.m. BMP  History of coronary artery disease status post CABG: Continue aspirin, atorvastatin, isosorbide mononitrate  Mitral valve replacement Moderate mitral stenosis on repeat TEE Reports of a history of mitral valve replacement.  Recent TEE shows moderate mitral stenosis which may be contributing to his ongoing symptoms of shortness of breath.  States of follow-up at [facility] to be assessed for surgical evaluation.  Type 2 diabetes mellitus: Currently not on medication given previous history of possible hypo-glycemic episodes.  During his last admission, work-up: C-peptide, insulin and sulfonylurea level were concerning for endogenous production of insulin.   FEN: Replace electrolytes as needed, renal diet VTE prophylaxis: Heparin subcutaneous CODE STATUS: Full code  Dispo: Admit patient to Inpatient with expected length of stay greater than 2 midnights.  Signed: Jean Rosenthal, MD 08/10/2018, 11:40 PM  Pager: [phone] IMTS PGY-1

## 2018-08-11 ENCOUNTER — Inpatient Hospital Stay (HOSPITAL_COMMUNITY): Payer: Medicare Other

## 2018-08-11 ENCOUNTER — Telehealth: Payer: Self-pay

## 2018-08-11 DIAGNOSIS — Z952 Presence of prosthetic heart valve: Secondary | ICD-10-CM

## 2018-08-11 DIAGNOSIS — Z91040 Latex allergy status: Secondary | ICD-10-CM

## 2018-08-11 DIAGNOSIS — I132 Hypertensive heart and chronic kidney disease with heart failure and with stage 5 chronic kidney disease, or end stage renal disease: Secondary | ICD-10-CM

## 2018-08-11 DIAGNOSIS — L818 Other specified disorders of pigmentation: Secondary | ICD-10-CM

## 2018-08-11 DIAGNOSIS — E875 Hyperkalemia: Secondary | ICD-10-CM

## 2018-08-11 DIAGNOSIS — E1122 Type 2 diabetes mellitus with diabetic chronic kidney disease: Secondary | ICD-10-CM

## 2018-08-11 DIAGNOSIS — R011 Cardiac murmur, unspecified: Secondary | ICD-10-CM

## 2018-08-11 DIAGNOSIS — I050 Rheumatic mitral stenosis: Secondary | ICD-10-CM

## 2018-08-11 DIAGNOSIS — G8929 Other chronic pain: Secondary | ICD-10-CM

## 2018-08-11 DIAGNOSIS — J9601 Acute respiratory failure with hypoxia: Secondary | ICD-10-CM

## 2018-08-11 DIAGNOSIS — Z888 Allergy status to other drugs, medicaments and biological substances status: Secondary | ICD-10-CM

## 2018-08-11 DIAGNOSIS — K219 Gastro-esophageal reflux disease without esophagitis: Secondary | ICD-10-CM

## 2018-08-11 DIAGNOSIS — N186 End stage renal disease: Secondary | ICD-10-CM

## 2018-08-11 DIAGNOSIS — Z951 Presence of aortocoronary bypass graft: Secondary | ICD-10-CM

## 2018-08-11 DIAGNOSIS — E1151 Type 2 diabetes mellitus with diabetic peripheral angiopathy without gangrene: Secondary | ICD-10-CM

## 2018-08-11 DIAGNOSIS — Z91048 Other nonmedicinal substance allergy status: Secondary | ICD-10-CM

## 2018-08-11 DIAGNOSIS — Z7982 Long term (current) use of aspirin: Secondary | ICD-10-CM

## 2018-08-11 DIAGNOSIS — Z79899 Other long term (current) drug therapy: Secondary | ICD-10-CM

## 2018-08-11 DIAGNOSIS — M549 Dorsalgia, unspecified: Secondary | ICD-10-CM

## 2018-08-11 DIAGNOSIS — Z5329 Procedure and treatment not carried out because of patient's decision for other reasons: Secondary | ICD-10-CM

## 2018-08-11 DIAGNOSIS — I5032 Chronic diastolic (congestive) heart failure: Secondary | ICD-10-CM

## 2018-08-11 DIAGNOSIS — Z992 Dependence on renal dialysis: Secondary | ICD-10-CM

## 2018-08-11 DIAGNOSIS — I2510 Atherosclerotic heart disease of native coronary artery without angina pectoris: Secondary | ICD-10-CM

## 2018-08-11 DIAGNOSIS — J811 Chronic pulmonary edema: Secondary | ICD-10-CM

## 2018-08-11 LAB — CBC
HCT: 29.6 % — ABNORMAL LOW (ref 39.0–52.0)
HCT: 32.1 % — ABNORMAL LOW (ref 39.0–52.0)
Hemoglobin: 8.7 g/dL — ABNORMAL LOW (ref 13.0–17.0)
Hemoglobin: 9.1 g/dL — ABNORMAL LOW (ref 13.0–17.0)
MCH: 23.8 pg — ABNORMAL LOW (ref 26.0–34.0)
MCH: 23.8 pg — ABNORMAL LOW (ref 26.0–34.0)
MCHC: 28.3 g/dL — ABNORMAL LOW (ref 30.0–36.0)
MCHC: 29.4 g/dL — ABNORMAL LOW (ref 30.0–36.0)
MCV: 80.9 fL (ref 80.0–100.0)
MCV: 84 fL (ref 80.0–100.0)
Platelets: 173 10*3/uL (ref 150–400)
Platelets: 187 10*3/uL (ref 150–400)
RBC: 3.66 MIL/uL — ABNORMAL LOW (ref 4.22–5.81)
RBC: 3.82 MIL/uL — ABNORMAL LOW (ref 4.22–5.81)
RDW: 18 % — ABNORMAL HIGH (ref 11.5–15.5)
RDW: 18.5 % — ABNORMAL HIGH (ref 11.5–15.5)
WBC: 8.4 10*3/uL (ref 4.0–10.5)
WBC: 9.6 10*3/uL (ref 4.0–10.5)
nRBC: 0 % (ref 0.0–0.2)
nRBC: 0 % (ref 0.0–0.2)

## 2018-08-11 LAB — CREATININE, SERUM
Creatinine, Ser: 10.72 mg/dL — ABNORMAL HIGH (ref 0.61–1.24)
GFR calc Af Amer: 5 mL/min — ABNORMAL LOW (ref >60)
GFR calc non Af Amer: 5 mL/min — ABNORMAL LOW (ref >60)

## 2018-08-11 LAB — BASIC METABOLIC PANEL
Anion gap: 7 (ref 5–15)
BUN: 18 mg/dL (ref 6–20)
CO2: 29 mmol/L (ref 22–32)
Calcium: 8.6 mg/dL — ABNORMAL LOW (ref 8.9–10.3)
Chloride: 99 mmol/L (ref 98–111)
Creatinine, Ser: 5.1 mg/dL — ABNORMAL HIGH (ref 0.61–1.24)
GFR calc Af Amer: 13 mL/min — ABNORMAL LOW (ref >60)
GFR calc non Af Amer: 11 mL/min — ABNORMAL LOW (ref >60)
Glucose, Bld: 88 mg/dL (ref 70–99)
Potassium: 4.5 mmol/L (ref 3.5–5.1)
Sodium: 135 mmol/L (ref 135–145)

## 2018-08-11 LAB — HIV ANTIBODY (ROUTINE TESTING W REFLEX): HIV Screen 4th Generation wRfx: NONREACTIVE

## 2018-08-11 MED ORDER — HEPARIN SODIUM (PORCINE) 1000 UNIT/ML DIALYSIS
8000.0000 [IU] | INTRAMUSCULAR | Status: DC | PRN
  Filled 2018-08-11: qty 8

## 2018-08-11 MED ORDER — SODIUM CHLORIDE 0.9 % IV SOLN: 100.0000 mL | INTRAVENOUS | Status: DC | PRN

## 2018-08-11 NOTE — Discharge Summary (Signed)
   Name: [name] [name] [name] MRN: [id number] DOB: 15-Jun-1956 62 y.o. PCP: Neva Seat, MD  Date of Admission: 08/10/2018  7:37 PM Date of Discharge: 08/11/2018 Attending Physician: No att. providers found  Discharge Diagnosis: Active Problems:   SOB (shortness of breath) SOB likely 2/2 volume overload due to ESRD also in setting of CHF and recently worsened mitral stenosis  Disposition and follow-up:   [name] left hospital AGAINST MEDICAL ADVICES.  2.  Labs / imaging needed at time of follow-up: BMP  Follow-up Appointments: Patient needs to follow-up with his [facility] cardiothoracic surgeon to discuss his mitral valve. Per nephrology, he may need to increase hemodialysis sessions to 4 times per week  Hospital Course by problem list: [name] [name] [name] is a 62 year old African-American gentleman with medical history significant for chronic diastolic heart failure EF 60 to 55%, ESRD on hemodialysis Monday/Wednesday/Friday, CAD status post CABG, mitral valve replacement, moderate mitral stenosison TEE 2019,type 2 diabetes mellitus not on medication, GERD, peripheral vascular disease, hypertension here for management of shortness of breath and hyperkalemia at 7.1. EKG showed peaked T waves.  Also hypoxic at O2sat 80s% at room air.  Chest x-ray showed pulmonary edema and possible infiltrate at right lower lobe .  He was a started with broad-spectrum antibiotic for possible pneumonia on ED, received urgent HD and Patient received insulin, glucose, calcium gluconate and albuterol. His SOB resolved.  Repeated chest x-ray showed some improvement, admit the diagnosis of pneumonia as a cause of shortness of breath less likely.  So that we stop antibiotic.  Ambulatory O2 saturation at room air was 91-92%.  (Which was not indicated for home oxygen). nephrology was on board, planned to do another hemodialysis tomorrow and then continuing his regular HD schedule on Wednesday.  Patient was not interested in staying  in hospital and doing another hemodialysis and further management.  Nephrologist schedule an outpatient hemodialysis, so he can be discharged today and do the hemodialysis outpatient tomorrow.  Patient notified but not interested and decided to leave the hospital AMA.  Discharge Vitals:   BP (!) 160/95 (BP Location: Right Arm)   Pulse (!) 106   Temp 98.2 F (36.8 C) (Oral)   Resp 18   SpO2 93%   Pertinent Labs, Studies, and Procedures:  Patient presented with 9 Lb increase in weight. (Estimated dry weight of 85.3)  BMP Latest Ref Rng & Units 08/11/2018 08/10/2018 08/10/2018  Glucose 70 - 99 mg/dL 88 - 82  BUN 6 - 20 mg/dL 18 - 47(H)  Creatinine 0.61 - 1.24 mg/dL 5.10(H) 10.72(H) 10.50(H)  BUN/Creat Ratio 10 - 24 - - -  Sodium 135 - 145 mmol/L 135 - 141  Potassium 3.5 - 5.1 mmol/L 4.5 - 7.1(HH)  Chloride 98 - 111 mmol/L 99 - 102  CO2 22 - 32 mmol/L 29 - 27  Calcium 8.9 - 10.3 mg/dL 8.6(L) - 8.6(L)   Signed: Dewayne Hatch, MD 08/11/2018, 7:30 PM   Pager: [phone]

## 2018-08-11 NOTE — Plan of Care (Signed)
  Problem: Education: Goal: Knowledge of General Education information will improve Description Including pain rating scale, medication(s)/side effects and non-pharmacologic comfort measures Outcome: Progressing   Problem: Clinical Measurements: Goal: Ability to maintain clinical measurements within normal limits will improve Outcome: Progressing   Problem: Coping: Goal: Level of anxiety will decrease Outcome: Not Progressing

## 2018-08-11 NOTE — Progress Notes (Signed)
Pt left AMA, MD was aware. Paperwork was signed. Pt wheeled to main entrance.

## 2018-08-11 NOTE — Telephone Encounter (Signed)
Spoke w/ pt, he states he has back pain so bad but he cant hardly function but cannot come to clinic before Thursday, he has appt thurs [facility]. Ask him to go to ED if he cannot wait or becomes worse

## 2018-08-11 NOTE — Telephone Encounter (Signed)
Requesting to speak with a nurse about something. Please call back.  

## 2018-08-11 NOTE — Progress Notes (Signed)
Subjective: Patient was seen and evaluated this morning rounds. He says he is ready to go home. Does not answer questions and says that he wants to leave. I explained to him about we would need another CXR and nephrology to see him. He agrees to stay to do these things done.   Objective:  Vital signs in last 24 hours: Vitals:   08/11/18 0330 08/11/18 0400 08/11/18 0428 08/11/18 0500  BP: (!) 182/88 128/77 (!) 155/86 (!) 160/95  Pulse: 92 98 96 (!) 106  Resp: (!) 22 18 (!) 22 18  Temp:   98.4 F (36.9 C) 98.2 F (36.8 C)  TempSrc:   Oral Oral  SpO2:  100% 99% 93%   Physical Exam  Constitutional: He is oriented to person, place, and time. He appears well-developed and well-nourished. No distress.  HENT:  Head: Normocephalic and atraumatic.  Eyes: EOM are normal.  Cardiovascular: Normal rate and regular rhythm.  Murmur heard. Pulmonary/Chest: Effort normal. No accessory muscle usage. No tachypnea. No respiratory distress. He has decreased breath sounds in the right lower field and the left lower field. He has no wheezes. He has no rales.  Abdominal: Soft. There is no tenderness.  Neurological: He is alert and oriented to person, place, and time.   Assessment/Plan:  Active Problems:   SOB (shortness of breath)  Mr. Votaw is a 62 year old African-American gentleman with medical history significant for chronic diastolic heart failure EF 60 to 55%, ESRD on hemodialysis Monday/Wednesday/Friday, CAD status post CABG, mitral valve replacement, moderate mitral stenosis on TEE 2019, type 2 diabetes mellitus not on medication, GERD, peripheral vascular disease, hypertension here for management of shortness of breath and hyperkalemia.  Shortness of breath and hypoxia: Currently asymptomatic after hemodialysis last night. saturating at 91 at room air with ambulation and rest. Repeat chest x-ray today, showed some improvement in the right lower lobe infiltration.  Less likely to be pneumonia.   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 Per Nephrology may need to increase hemodialysis frequency.   ESRD: On HD M/W/F. patient presented with shortness of breath, hyperkalemia at 7.1. Hemodialysis earlier this a.m. Patient with wight up on arrival.Denies any missed hemodialysis. Underwent urgent   Nephrology is on board.  Recommended extra hemodialysis tomorrow and then following his previous HD schedule on Wednesday and then plan fabout if he needs more 4 session per week instead of 3. However patient is not interested in that and is angry about that. Patient says it is too much to get another HD per week. Nephrologist was in patient's room when I revisited him. Mentioned that out patient HD scheduled for him for tomorrow.  Patient is not interested.  She also offered in hospital HD tomorrow. But patient is not interested either.  -Follow-up outpatient hemodialysis and additional HD if patient changes his mind  Hyperkalemia: Secondary to dietary potassium indiscretion in the setting of end-stage renal disease.  Status post emergent hemodialysis overnight. Resolved  HTN: BP improved after hemodialysis Continue home meds  Chronic diastolic heart failure: Can play a role in patient's shortness of breath however his symptoms resolved after hemodialysis and chest x-ray improved.  Mitral valve stenosis of prosthetic valve: May play a role in patient's shortness of breath and pulmonary edema. Talk to the patient about that. - Awaiting referral to surgeon at [facility]: Anticipated discharge in approximately today after planning for  next hemodialysis session  Dewayne Hatch, MD 08/11/2018, 5:20 AM Pager: [phone]

## 2018-08-11 NOTE — Progress Notes (Signed)
Pt was admitted to 59m06 for SOB. Pt came from HD via stretcher by RN. Pt is A&OX3; denied SOB and pain. Pt refused telemetry, morning lab and threatened to leave AMA at 8 a.m this morning. Dr. Eileen Stanford was made aware.

## 2018-08-11 NOTE — Progress Notes (Signed)
Dr. Harriet Masson was made aware that pt wants to leave AMA and signed the [place] form.  Dr. Harriet Masson came to talk with pt. Pt agreed to stay and get a STAT CXR. AMA null and void. Pt was taken to radiology for  STAT CXR.

## 2018-08-11 NOTE — Consult Note (Signed)
Reason for Consult: Hyperkalemia in patient with end-stage renal disease Referring Physician: Lenice Pressman, MD (IMTS)  HPI:  62 year old African-American man with past medical history significant for chronic diastolic heart failure, coronary artery disease status post PTCA, history of mitral valve replacement with stenosis of prosthetic valve, history of TIA, type 2 diabetes mellitus, recent problems with back pain/back spasm and end-stage renal disease on hemodialysis.  Presented to the emergency room yesterday with shortness of breath especially worse with exertion along with some increased chronic back pain radiating to the right upper quadrant.  Denies any cough, sputum production, wheeze, fever or chills.  Denies any nausea, vomiting or diarrhea.  Reports having eaten "more tomatoes over the past few days".  Of note, he was just recently discharged from the hospital on 10/29 after admission for shortness of breath from volume overload.  He went to hemodialysis on 11/1 as scheduled and redness will treatment but left at 86.4 kg (EDW 83.5).  Dialysis prescription: [facility], Monday/Wednesday/Friday, 4 hours, Optiflux 200, BFR 500, DFR 1000, EDW 83.5 kg, 2K/2.0 calcium, UF profile #2, no sodium modeling, heparin 8000 units bolus, Sensipar 180 mg 3 times weekly at dialysis  Past Medical History:  Diagnosis Date  . Anemia   . Anginal pain ([place])   . CAD (coronary artery disease)    a. per [facility] s/p 3.13mm x 21mm Vision BMS to mid LAD 12/2009 and Xience DES to mid LAD 10/2010.  [name] Chronic diastolic CHF (congestive heart failure) ([place])   . Colon polyps   . Daily headache   . ESRD on dialysis [facility]) since ~ 2008   "MWF; Jeneen Rinks" (03/04/2017)  . GERD (gastroesophageal reflux disease)   . H/O TIA (transient ischemic attack) 04/01/2015  . H/O TIA (transient ischemic attack) 04/01/2015  . Heart murmur   . Hematochezia    a. 2014: colonscopy, which showed moderately-sized internal  hemorrhoids, two 55mm polyps in transverse colon and ascending colon that were resected, five 2-16mm polyps in sigmoid colon, descending colon, transverse colon, and ascending colon that were resected. An upper endoscopy was performed and showed normal esophagus, stomach, and duodenum.  . Hematuria    a. H/o hematuria 2014 with cystoscopy that was unrevealing for his source of hematuria. He underwent a kidney ultrasound on 10/14 that showed mildly echogenic and scarred kidneys compatible with medical renal disease, without hydronephrosis or renal calculi.  [name] History of blood transfusion    "had colonoscopy done; they had to give me some blood"  . History of kidney stones   . Hyperlipidemia   . Hypertension   . On home oxygen therapy    "2L prn" (07/21/2015); "been off it for awhile" (03/04/2017)  . Pneumonia   . Renal insufficiency   . 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 Type II diabetes mellitus ([place])   . Wears dentures     Past Surgical History:  Procedure Laterality Date  . ABDOMINAL AORTOGRAM W/LOWER EXTREMITY N/A 05/29/2018   Procedure: ABDOMINAL AORTOGRAM W/LOWER EXTREMITY;  Surgeon: Lorretta Harp, MD;  Location: [facility] CV LAB;  Service: Cardiovascular;  Laterality: N/A;  . AV FISTULA PLACEMENT Left ~ 2007   "upper arm"  . CARDIAC CATHETERIZATION  "several"  . CORONARY ANGIOPLASTY WITH STENT PLACEMENT  "several"  . CORONARY ARTERY BYPASS GRAFT     3 grafts  . CYSTOSCOPY W/ STONE MANIPULATION  X2?  . EYE SURGERY Bilateral    "laser OR for  hemorrhage"  . IR THORACENTESIS ASP PLEURAL SPACE W/IMG GUIDE  07/09/2018  . LEFT HEART CATHETERIZATION WITH CORONARY ANGIOGRAM N/A 11/23/2014   Procedure: LEFT HEART CATHETERIZATION WITH CORONARY ANGIOGRAM;  Surgeon: Troy Sine, MD;  Location: [facility] CATH LAB;  Service: Cardiovascular;  Laterality: N/A;  . LITHOTRIPSY  X1  . MITRAL VALVE REPLACEMENT    . REVISON OF ARTERIOVENOUS FISTULA Left 85/11/7739    Procedure: PLICATION OF LEFT ARM ARTERIOVENOUS FISTULA;  Surgeon: Angelia Mould, MD;  Location: [facility];  Service: Vascular;  Laterality: Left;  . REVISON OF ARTERIOVENOUS FISTULA Left 2/87/8676   Procedure: PLICATE ANEURYSM  OF LEFT ARTERIOVENOUS FISTULA;  Surgeon: Waynetta Sandy, MD;  Location: [place];  Service: Vascular;  Laterality: Left;  . TEE WITHOUT CARDIOVERSION N/A 07/23/2018   Procedure: TRANSESOPHAGEAL ECHOCARDIOGRAM (TEE);  Surgeon: Sueanne Margarita, MD;  Location: [facility] ENDOSCOPY;  Service: Cardiovascular;  Laterality: N/A;    Family History  Problem Relation Age of Onset  . Bone cancer Mother   . Anuerysm Father   . Hypertension Unknown   . Diabetes type II Daughter   . Ovarian cancer Sister     Social History:  reports that he has quit smoking. His smoking use included cigars. He has a 2.40 pack-year smoking history. He has never used smokeless tobacco. He reports that he does not drink alcohol or use drugs.  Allergies:  Allergies  Allergen Reactions  . Enalapril Hives and Rash  . Latex Rash  . Tape Rash and Other (See Comments)    TAPE MAKES SKIN BREAK OUT AND TURN RED    Medications:  Scheduled: . acetaminophen  650 mg Oral Once  . albuterol  10 mg Nebulization Once  . amLODipine  10 mg Oral QHS  . aspirin EC  81 mg Oral Daily  . atorvastatin  20 mg Oral QHS  . Chlorhexidine Gluconate Cloth  6 each Topical Q0600  . cinacalcet  180 mg Oral Q M,W,F-HD  . heparin  5,000 Units Subcutaneous Q8H  . insulin aspart      . isosorbide mononitrate  30 mg Oral Daily  . lisinopril  10 mg Oral Daily  . multivitamin  1 tablet Oral Daily  . pregabalin  50 mg Oral QHS  . rOPINIRole  0.25 mg Oral QHS  . sevelamer carbonate  3,200 mg Oral TID WC  . sodium chloride flush  3 mL Intravenous Q12H    BMP Latest Ref Rng & Units 08/10/2018 08/10/2018 08/05/2018  Glucose 70 - 99 mg/dL - 82 86  BUN 6 - 20 mg/dL - 47(H) 24(H)  Creatinine 0.61 - 1.24 mg/dL 10.72(H)  10.50(H) 6.61(H)  BUN/Creat Ratio 10 - 24 - - -  Sodium 135 - 145 mmol/L - 141 136  Potassium 3.5 - 5.1 mmol/L - 7.1(HH) 4.8  Chloride 98 - 111 mmol/L - 102 95(L)  CO2 22 - 32 mmol/L - 27 28  Calcium 8.9 - 10.3 mg/dL - 8.6(L) 9.1   CBC Latest Ref Rng & Units 08/10/2018 08/10/2018 08/04/2018  WBC 4.0 - 10.5 K/uL 9.6 10.0 8.6  Hemoglobin 13.0 - 17.0 g/dL 9.1(L) 9.5(L) 9.6(L)  Hematocrit 39.0 - 52.0 % 32.1(L) 32.5(L) 31.7(L)  Platelets 150 - 400 K/uL 187 184 238     Dg Chest 2 View  Result Date: 08/10/2018 CLINICAL DATA:  Shortness of breath for several hours EXAM: CHEST - 2 VIEW COMPARISON:  08/02/2018 FINDINGS: Cardiac shadow remains enlarged. Postsurgical changes are again noted. Patchy infiltrative changes are  seen in the right lung base new from the prior exam. Stable left pleural effusion laterally is seen. Vascular congestion is noted although mildly improved when compared with the prior study. IMPRESSION: Changes of CHF mildly improved from the prior study. New right basilar infiltrate. Electronically Signed   By: Inez Catalina M.D.   On: 08/10/2018 21:30    Review of Systems  Constitutional: Positive for malaise/fatigue. Negative for chills and fever.  HENT: Negative.   Eyes: Negative.   Respiratory: Positive for shortness of breath. Negative for cough, sputum production and wheezing.   Cardiovascular: Positive for palpitations. Negative for chest pain, orthopnea and leg swelling.  Gastrointestinal: Negative.   Genitourinary: Negative.   Musculoskeletal: Positive for back pain.       With spasms  Skin: Negative.   Neurological: Negative.    Blood pressure (!) 160/95, pulse (!) 106, temperature 98.2 F (36.8 C), temperature source Oral, resp. rate 18, SpO2 93 %. Physical Exam  Nursing note and vitals reviewed. Constitutional: He is oriented to person, place, and time. He appears well-developed and well-nourished. No distress.  HENT:  Head: Normocephalic and atraumatic.   Mouth/Throat: Oropharynx is clear and moist. No oropharyngeal exudate.  Wearing oxygen via nasal cannula  Eyes: Pupils are equal, round, and reactive to light. EOM are normal. No scleral icterus.  Neck: Normal range of motion. Neck supple. No JVD present.  Cardiovascular: Regular rhythm. Exam reveals no friction rub.  No murmur heard. Regular tachycardia  Respiratory: Effort normal. He has no wheezes. He has rales.  Left base rales posterior lung field  GI: Soft. Bowel sounds are normal. There is no tenderness. There is no rebound and no guarding.  Musculoskeletal: Normal range of motion. He exhibits no edema.  Left upper arm brachiocephalic fistula with fair thrill  Neurological: He is alert and oriented to person, place, and time.  Skin: Skin is warm and dry. No erythema.    Assessment/Plan: 1.  Shortness of breath: Likely from combination of volume overload and possibly healthcare associated pneumonia with clinically lower suspicion of the latter given absence of fever/cough.  Unfortunately, he needs continued aggressive lowering of his dry weight and may indeed require hemodialysis 4 days a week to help optimize volume status and limit rehospitalizations.  Reports to be feeling better this morning on oxygen via nasal cannula following hemodialysis overnight.  Will order for repeat dialysis again tomorrow if still here. 2.  Hyperkalemia: Secondary to dietary potassium indiscretion in the setting of end-stage renal disease.  Status post emergent hemodialysis overnight. 3.  End-stage renal disease: Emergent hemodialysis earlier this morning will likely need additional dialysis tomorrow following which he will resume Monday/Wednesday/Friday schedule with continued discussions regarding dialysis 4 days a week. 4.  Hypertension: Exacerbated by volume overload/back spasms-monitor with ultrafiltration and resumption of antihypertensive therapy. 5.  Mitral valve stenosis of prosthetic valve:  Awaiting referral to [facility] where he had his MVR 6.  Anemia of chronic kidney disease: Resume ESA with hemodialysis.  He does not have any overt loss. 7.  Secondary hyperparathyroidism: Resume phosphorus binders with meals 3 times a day and restart cinacalcet with hemodialysis.   Tyerra Loretto K. 08/11/2018, 6:49 AM

## 2018-08-12 ENCOUNTER — Ambulatory Visit (INDEPENDENT_AMBULATORY_CARE_PROVIDER_SITE_OTHER): Payer: Medicare Other | Admitting: Internal Medicine

## 2018-08-12 ENCOUNTER — Other Ambulatory Visit: Payer: Self-pay

## 2018-08-12 DIAGNOSIS — G8929 Other chronic pain: Secondary | ICD-10-CM | POA: Insufficient documentation

## 2018-08-12 DIAGNOSIS — Z87891 Personal history of nicotine dependence: Secondary | ICD-10-CM

## 2018-08-12 DIAGNOSIS — I10 Essential (primary) hypertension: Secondary | ICD-10-CM

## 2018-08-12 DIAGNOSIS — Z7982 Long term (current) use of aspirin: Secondary | ICD-10-CM

## 2018-08-12 DIAGNOSIS — Z79899 Other long term (current) drug therapy: Secondary | ICD-10-CM | POA: Diagnosis not present

## 2018-08-12 DIAGNOSIS — N2889 Other specified disorders of kidney and ureter: Principal | ICD-10-CM

## 2018-08-12 DIAGNOSIS — N186 End stage renal disease: Secondary | ICD-10-CM | POA: Diagnosis not present

## 2018-08-12 DIAGNOSIS — M62830 Muscle spasm of back: Secondary | ICD-10-CM | POA: Diagnosis not present

## 2018-08-12 DIAGNOSIS — M549 Dorsalgia, unspecified: Secondary | ICD-10-CM

## 2018-08-12 DIAGNOSIS — N2581 Secondary hyperparathyroidism of renal origin: Secondary | ICD-10-CM | POA: Diagnosis not present

## 2018-08-12 DIAGNOSIS — D631 Anemia in chronic kidney disease: Secondary | ICD-10-CM | POA: Diagnosis not present

## 2018-08-12 DIAGNOSIS — I151 Hypertension secondary to other renal disorders: Secondary | ICD-10-CM

## 2018-08-12 MED ORDER — CYCLOBENZAPRINE HCL 5 MG PO TABS: 5.0000 mg | ORAL_TABLET | Freq: Every evening | ORAL | 1 refills | Status: DC | PRN

## 2018-08-12 MED ORDER — LISINOPRIL 10 MG PO TABS: 20.0000 mg | ORAL_TABLET | Freq: Every day | ORAL | 3 refills | Status: DC

## 2018-08-12 NOTE — Assessment & Plan Note (Addendum)
Assessment: He reports that he has been having back spasms for the past 2 years and that it has been getting worse over the past few months. He states that they occur mostly at night and will wake him up from sleep.  He feels like someone is stabbing.  Reports that he is tried tramadol in the past which is helped tried Flexeril which is also helped.  Apparently he had some altered mental status when he was taking the Flexeril and the there got the same time.  He was given 3 tablets on his last visit and he reports that they helped and he did not have any altered mental status at that time.  On exam he has some tenderness in the muscular area of the right trapezius.    Wife is present and is requesting a psychiatrist referral.  She reports that he has significant which causes a lot of stress.  He also reports that he has thoughts of harming himself at times, he denies any current suicidal ideations at this time.  He denies any specific plans.  Reports that he is interested in seeing someone to discuss his stress with.  Plan: -We will attempt Flexeril again, he denies any symptoms when he had it last month. -Prescribe a 30-day supply of flexeril 5 mg nightly as needed. Advised patient to stop medications if he develops side effects -Place a referral for intergrated behavioral health -Follow up with PCP in 2 months to discuss other options

## 2018-08-12 NOTE — Progress Notes (Signed)
CC: Back pain  HPI: [name] is a 62 y.o. with history noted below presenting today for back pain.  He was recently left the hospital yesterday following an episode of shortness of breath, hyperkalemia, peaked T waves, and hypoxia.  He supposed to stay for dialysis.  He did not want to stay so he left AMA. He went to dialysis today and had no complications.   Please see A&P for status of the patient's chronic medical conditions  Past Medical History:  Diagnosis Date  . Anemia   . Anginal pain ([place])   . CAD (coronary artery disease)    a. per [facility] s/p 3.58mm x 52mm Vision BMS to mid LAD 12/2009 and Xience DES to mid LAD 10/2010.  [name] Chronic diastolic CHF (congestive heart failure) ([place])   . Colon polyps   . Daily headache   . ESRD on dialysis [facility]) since ~ 2008   "MWF; Jeneen Rinks" (03/04/2017)  . GERD (gastroesophageal reflux disease)   . H/O TIA (transient ischemic attack) 04/01/2015  . H/O TIA (transient ischemic attack) 04/01/2015  . Heart murmur   . Hematochezia    a. 2014: colonscopy, which showed moderately-sized internal hemorrhoids, two 50mm polyps in transverse colon and ascending colon that were resected, five 2-59mm polyps in sigmoid colon, descending colon, transverse colon, and ascending colon that were resected. An upper endoscopy was performed and showed normal esophagus, stomach, and duodenum.  . Hematuria    a. H/o hematuria 2014 with cystoscopy that was unrevealing for his source of hematuria. He underwent a kidney ultrasound on 10/14 that showed mildly echogenic and scarred kidneys compatible with medical renal disease, without hydronephrosis or renal calculi.  [name] History of blood transfusion    "had colonoscopy done; they had to give me some blood"  . History of kidney stones   . Hyperlipidemia   . Hypertension   . On home oxygen therapy    "2L prn" (07/21/2015); "been off it for awhile" (03/04/2017)  . Pneumonia   . Renal insufficiency   .  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 Type II diabetes mellitus ([place])   . Wears dentures    Review of Systems: Refer to history of present illness and assessment and plans for pertinent review of systems, all others reviewed and negative.  Physical Exam:  Vitals:   08/12/18 1547  BP: (!) 176/82  Pulse: (!) 103  Temp: 98.1 F (36.7 C)  TempSrc: Oral  SpO2: 98%  Weight: 190 lb 1.6 oz (86.2 kg)  Height: 6' (1.829 m)   Physical Exam  Constitutional: He is oriented to person, place, and time. He appears distressed (mild distress).  Well nourished, appears stated age  HENT:  Head: Normocephalic and atraumatic.  Eyes: Pupils are equal, round, and reactive to light. Conjunctivae and EOM are normal.  Neck: Normal range of motion. Neck supple. No thyromegaly present.  Cardiovascular: Normal rate, regular rhythm and normal heart sounds. Exam reveals no gallop and no friction rub.  No murmur heard. Pulmonary/Chest: Effort normal and breath sounds normal. No respiratory distress. He has no wheezes.  Abdominal: Soft. Bowel sounds are normal. He exhibits no distension.  Musculoskeletal: Normal range of motion. He exhibits tenderness (tender to palpation over right upper back, no rashes or lesions noted).  Neurological: He is alert and oriented to person, place, and time. Gait normal.  Skin: Skin is warm and dry. No erythema.  Psychiatric: Mood and  affect normal.     Social History   Socioeconomic History  . Marital status: Married    Spouse name: Not on file  . Number of children: Not on file  . Years of education: Not on file  . Highest education level: Not on file  Occupational History  . Not on file  Social Needs  . Financial resource strain: Not on file  . Food insecurity:    Worry: Not on file    Inability: Not on file  . Transportation needs:    Medical: Not on file    Non-medical: Not on file  Tobacco Use  . Smoking status: Former Smoker     Packs/day: 0.30    Years: 8.00    Pack years: 2.40    Types: Cigars  . Smokeless tobacco: Never Used  . Tobacco comment: black and mild every 3-4 days  Substance and Sexual Activity  . Alcohol use: No    Alcohol/week: 0.0 standard drinks  . Drug use: No  . Sexual activity: Not Currently  Lifestyle  . Physical activity:    Days per week: Not on file    Minutes per session: Not on file  . Stress: Not on file  Relationships  . Social connections:    Talks on phone: Not on file    Gets together: Not on file    Attends religious service: Not on file    Active member of club or organization: Not on file    Attends meetings of clubs or organizations: Not on file    Relationship status: Not on file  . Intimate partner violence:    Fear of current or ex partner: Not on file    Emotionally abused: Not on file    Physically abused: Not on file    Forced sexual activity: Not on file  Other Topics Concern  . Not on file  Social History Narrative   Moved from [place], [place] to [employer] in 11/2014.      Lives with fiancee      Quit smoking 02/2017    Family History  Problem Relation Age of Onset  . Bone cancer Mother   . Anuerysm Father   . Hypertension Unknown   . Diabetes type II Daughter   . Ovarian cancer Sister     Assessment & Plan:   See Encounters Tab for problem based charting.  Patient seen with Dr. Eppie Gibson

## 2018-08-12 NOTE — Assessment & Plan Note (Signed)
Assessment: Patient reports that he takes all his medications as prescribed.  Blood pressure today was 175/82.  He appears to be in pain at the moment which may be contributing to his hypertension.  He also seems to be up this also seems to be around where he normally is.  Denies any symptoms at the moment.  Plan:  -Continue amlodipine 10 mg daily -Increase lisinopril to 20 mg daily

## 2018-08-12 NOTE — Patient Instructions (Signed)
Thank you for allowing [place] to provide your care today. Today we discussed your back pain    Today we made some changes to your medications.   Please start taking flexeril 5 mg as needed before bed.  Please increase your lisinopril to 20 mg daily We have sent your precription to the pharmacy we have on file We have made a referral to the psychologist    Please follow-up in 2 months with your primary care provider.    Should you have any questions or concerns please call the internal medicine clinic at [phone].

## 2018-08-13 ENCOUNTER — Ambulatory Visit: Payer: Medicare Other | Admitting: Physician Assistant

## 2018-08-13 ENCOUNTER — Telehealth: Payer: Self-pay | Admitting: Licensed Clinical Social Worker

## 2018-08-13 DIAGNOSIS — N2581 Secondary hyperparathyroidism of renal origin: Secondary | ICD-10-CM | POA: Diagnosis not present

## 2018-08-13 DIAGNOSIS — D631 Anemia in chronic kidney disease: Secondary | ICD-10-CM | POA: Diagnosis not present

## 2018-08-13 DIAGNOSIS — N186 End stage renal disease: Secondary | ICD-10-CM | POA: Diagnosis not present

## 2018-08-13 NOTE — Telephone Encounter (Signed)
Patient was contacted due to a recent referral. No answer/left voicemail to set up a future appointment.

## 2018-08-13 NOTE — Progress Notes (Signed)
Internal Medicine Clinic Attending  I saw and evaluated the patient.  I personally confirmed the key portions of the history and exam documented by Dr. Seawell and I reviewed pertinent patient test results.  The assessment, diagnosis, and plan were formulated together and I agree with the documentation in the resident's note.     

## 2018-08-14 ENCOUNTER — Telehealth (HOSPITAL_COMMUNITY): Payer: Self-pay

## 2018-08-14 ENCOUNTER — Ambulatory Visit: Payer: Medicare Other

## 2018-08-14 ENCOUNTER — Encounter: Payer: Self-pay | Admitting: Licensed Clinical Social Worker

## 2018-08-14 ENCOUNTER — Ambulatory Visit: Payer: Medicare Other | Admitting: Licensed Clinical Social Worker

## 2018-08-14 DIAGNOSIS — F411 Generalized anxiety disorder: Secondary | ICD-10-CM

## 2018-08-14 LAB — SULFONYLUREA HYPOGLYCEMICS PANEL, SERUM
Acetohexamide: NEGATIVE ug/mL (ref 20–60)
CHLORPROPAMIDE: NEGATIVE ug/mL (ref 75–250)
GLIMEPIRIDE: NEGATIVE ng/mL (ref 80–250)
GLYBURIDE: NEGATIVE ng/mL
Glipizide: NEGATIVE ng/mL (ref 200–1000)
NATEGLINIDE: NEGATIVE ng/mL
Repaglinide: NEGATIVE ng/mL
TOLBUTAMIDE: NEGATIVE ug/mL (ref 40–100)
Tolazamide: NEGATIVE ug/mL

## 2018-08-14 NOTE — Telephone Encounter (Signed)
Called to follow up with Dr. Posey Pronto office on a clearance letter and office note request. Spoke to [facility] at Dr. Posey Pronto office who stated she will fax request over today. Will follow up with Dr. Posey Pronto office later today.  Also called to give pt an update of his referral, he stated he still would like to join [facility].

## 2018-08-14 NOTE — BH Specialist Note (Signed)
Integrated Behavioral Health Initial Visit  MRN: [id number] Name: [name] [name]  Number of [place] Clinician visits:: 1/6 Session Start time: 11:13  Session End time: 12:07 Total time: 54 minutes  Type of Service: [place] Interpretor:No.         SUBJECTIVE: [name] [name] is a 62 y.o. male accompanied by Spouse Patient was referred by Dr. Sherry Ruffing for depression. Patient reports the following symptoms/concerns: irritability, sleep disturbances, daily anxiety, depression, avoidant type behaviors, and negative communication with others.  Duration of problem: increased since his open heart surgery in March.; Severity of problem: moderate  OBJECTIVE: Mood: Anxious, Depressed and Hopeless and Affect: Constricted and Depressed Risk of harm to self or others: No plan to harm self or others  LIFE CONTEXT: Family and Social: Patient lives with his wife. Patient and his wife have been married for one year. Patient and his wife are having challenges in their relationship. Patient wants to spend more time with his children.  School/Work: Not currently working due to health issues.  Self-Care: Patient's hygiene has decreased over the past six months. Patient lacks interest in taking showers and changing clothes. Life Changes: Health issues have increased over the past six months. Patient has lost 15 pounds in the past three weeks. Patient and his wife are arguing frequently.   GOALS ADDRESSED: Patient will: 1. Reduce symptoms of: agitation, anxiety, depression and insomnia 2. Increase knowledge and/or ability of: coping skills, stress reduction and negative thought patterns that lead to anxiety and depression.   3. Demonstrate ability to: Increase healthy adjustment to current life circumstances, Increase adequate support systems for patient/family, Increase motivation to adhere to plan of care and Improve medication  compliance  INTERVENTIONS: Interventions utilized: Motivational Interviewing, Solution-Focused Strategies, Brief CBT, Supportive Counseling and Sleep Hygiene  Standardized Assessments completed: GAD-7 (Score was 21).   ASSESSMENT: Patient currently experiencing anger/irritability and anxiety daily. Patient's hopelessness and lack of interest have increased in severity over the past six months. Patient is in the contemplation stage of change.   Patient may benefit from weekly outpatient counseling. Patient could also benefit from family sessions as well as individual.   PLAN: 1. Follow up with behavioral health clinician on : 08/21/18. 2. Behavioral recommendations: Work on communicating thoughts and feelings in a healthy way. Patient needs to practice healthy coping skills and challenging negative thought patterns.  3. Referral(s): [place] (In Clinic) and Psychiatrist (needs a referral to psychiatrist)    Dessie Coma, [place], LCAS

## 2018-08-15 DIAGNOSIS — E1129 Type 2 diabetes mellitus with other diabetic kidney complication: Secondary | ICD-10-CM | POA: Diagnosis not present

## 2018-08-15 DIAGNOSIS — N2581 Secondary hyperparathyroidism of renal origin: Secondary | ICD-10-CM | POA: Diagnosis not present

## 2018-08-15 DIAGNOSIS — D631 Anemia in chronic kidney disease: Secondary | ICD-10-CM | POA: Diagnosis not present

## 2018-08-15 DIAGNOSIS — D509 Iron deficiency anemia, unspecified: Secondary | ICD-10-CM | POA: Diagnosis not present

## 2018-08-15 DIAGNOSIS — N186 End stage renal disease: Secondary | ICD-10-CM | POA: Diagnosis not present

## 2018-08-15 LAB — CULTURE, BLOOD (ROUTINE X 2)
CULTURE: NO GROWTH
CULTURE: NO GROWTH

## 2018-08-15 NOTE — Progress Notes (Signed)
Patient ID: [name] [name], male   DOB: 1956-03-22, 62 y.o.   MRN: [id number]  I saw and evaluated the patient.  I personally confirmed the key portions of Dr. Dorothyann Peng history and exam and reviewed pertinent patient test results.  The assessment, diagnosis, and plan were formulated together and I agree with the documentation in the resident's note.

## 2018-08-18 DIAGNOSIS — N186 End stage renal disease: Secondary | ICD-10-CM | POA: Diagnosis not present

## 2018-08-18 DIAGNOSIS — D509 Iron deficiency anemia, unspecified: Secondary | ICD-10-CM | POA: Diagnosis not present

## 2018-08-18 DIAGNOSIS — D631 Anemia in chronic kidney disease: Secondary | ICD-10-CM | POA: Diagnosis not present

## 2018-08-18 DIAGNOSIS — N2581 Secondary hyperparathyroidism of renal origin: Secondary | ICD-10-CM | POA: Diagnosis not present

## 2018-08-20 ENCOUNTER — Encounter: Payer: Self-pay | Admitting: Physician Assistant

## 2018-08-20 DIAGNOSIS — D631 Anemia in chronic kidney disease: Secondary | ICD-10-CM | POA: Diagnosis not present

## 2018-08-20 DIAGNOSIS — N186 End stage renal disease: Secondary | ICD-10-CM | POA: Diagnosis not present

## 2018-08-20 DIAGNOSIS — N2581 Secondary hyperparathyroidism of renal origin: Secondary | ICD-10-CM | POA: Diagnosis not present

## 2018-08-20 DIAGNOSIS — D509 Iron deficiency anemia, unspecified: Secondary | ICD-10-CM | POA: Diagnosis not present

## 2018-08-21 ENCOUNTER — Ambulatory Visit: Payer: Medicare Other | Admitting: Licensed Clinical Social Worker

## 2018-08-22 DIAGNOSIS — N186 End stage renal disease: Secondary | ICD-10-CM | POA: Diagnosis not present

## 2018-08-22 DIAGNOSIS — D631 Anemia in chronic kidney disease: Secondary | ICD-10-CM | POA: Diagnosis not present

## 2018-08-22 DIAGNOSIS — N2581 Secondary hyperparathyroidism of renal origin: Secondary | ICD-10-CM | POA: Diagnosis not present

## 2018-08-22 DIAGNOSIS — D509 Iron deficiency anemia, unspecified: Secondary | ICD-10-CM | POA: Diagnosis not present

## 2018-08-25 ENCOUNTER — Other Ambulatory Visit: Payer: Self-pay | Admitting: Internal Medicine

## 2018-08-25 DIAGNOSIS — N186 End stage renal disease: Secondary | ICD-10-CM | POA: Diagnosis not present

## 2018-08-25 DIAGNOSIS — F411 Generalized anxiety disorder: Secondary | ICD-10-CM

## 2018-08-25 DIAGNOSIS — N2581 Secondary hyperparathyroidism of renal origin: Secondary | ICD-10-CM | POA: Diagnosis not present

## 2018-08-25 DIAGNOSIS — D631 Anemia in chronic kidney disease: Secondary | ICD-10-CM | POA: Diagnosis not present

## 2018-08-25 DIAGNOSIS — D509 Iron deficiency anemia, unspecified: Secondary | ICD-10-CM | POA: Diagnosis not present

## 2018-08-27 DIAGNOSIS — N186 End stage renal disease: Secondary | ICD-10-CM | POA: Diagnosis not present

## 2018-08-27 DIAGNOSIS — N2581 Secondary hyperparathyroidism of renal origin: Secondary | ICD-10-CM | POA: Diagnosis not present

## 2018-08-27 DIAGNOSIS — D631 Anemia in chronic kidney disease: Secondary | ICD-10-CM | POA: Diagnosis not present

## 2018-08-27 DIAGNOSIS — D509 Iron deficiency anemia, unspecified: Secondary | ICD-10-CM | POA: Diagnosis not present

## 2018-08-28 ENCOUNTER — Ambulatory Visit: Payer: Medicare Other | Admitting: Licensed Clinical Social Worker

## 2018-08-28 DIAGNOSIS — F411 Generalized anxiety disorder: Secondary | ICD-10-CM

## 2018-08-28 NOTE — BH Specialist Note (Signed)
Integrated Behavioral Health Follow Up Visit  MRN: [id number] Name: [name] [name]  Number of [place] Clinician visits: 2/6 Session Start time: 10:55  Session End time: 11:40 Total time: 45 minutes  Type of Service: Integrated Behavioral Health- Individual/Family Interpretor:No.    SUBJECTIVE: [name] [name] is a 62 y.o. male accompanied by Spouse Patient was referred by Dr.Krienke for depression. Patient reports the following symptoms/concerns: chronic pain, trust issues, poor communication, anger issues, worry about the future, avoidant type behavior, and isolation. Lack of sleep.  Duration of problem: increased since March; Severity of problem: moderate  OBJECTIVE: Mood: Anxious, Depressed and Hopeless and Affect: Constricted and Depressed Risk of harm to self or others: No plan to harm self or others  LIFE CONTEXT: Family and Social: Patient lives with his wife. Patient reported he has communicated in a healthier way to his wife over the past two weeks.  School/Work: Not working.  Self-Care: Poor sleep hygiene. Patient reported that he might have to start going to Dialysis four times a week.  Life Changes: Increased health issues.   GOALS ADDRESSED: Patient will: 1.  Reduce symptoms of: agitation, anxiety, depression and insomnia  2.  Increase knowledge and/or ability of: coping skills, healthy habits and stress reduction  3.  Demonstrate ability to: Increase healthy adjustment to current life circumstances, Increase adequate support systems for patient/family and Increase motivation to adhere to plan of care  INTERVENTIONS: Interventions utilized:  Motivational Interviewing, Solution-Focused Strategies, Brief CBT and Sleep Hygiene. Therapist encouraged the patient to identify reasons for trust. MI was used to identify healthy coping skills the patient could utilize when feeling lonely.  Standardized Assessments completed: Not Needed  ASSESSMENT: Patient  currently experiencing sleep disturbances, anger outburst, lack of trust for his partner, depression symptoms, and daily worry.   Patient may benefit from weekly to bi-weekly outpatient therapy. [name]  PLAN: 1. Follow up with behavioral health clinician on : two weeks.  Dessie Coma, LPC, LCAS

## 2018-08-29 DIAGNOSIS — N186 End stage renal disease: Secondary | ICD-10-CM | POA: Diagnosis not present

## 2018-08-29 DIAGNOSIS — D509 Iron deficiency anemia, unspecified: Secondary | ICD-10-CM | POA: Diagnosis not present

## 2018-08-29 DIAGNOSIS — N2581 Secondary hyperparathyroidism of renal origin: Secondary | ICD-10-CM | POA: Diagnosis not present

## 2018-08-29 DIAGNOSIS — D631 Anemia in chronic kidney disease: Secondary | ICD-10-CM | POA: Diagnosis not present

## 2018-08-31 DIAGNOSIS — D509 Iron deficiency anemia, unspecified: Secondary | ICD-10-CM | POA: Diagnosis not present

## 2018-08-31 DIAGNOSIS — N186 End stage renal disease: Secondary | ICD-10-CM | POA: Diagnosis not present

## 2018-08-31 DIAGNOSIS — D631 Anemia in chronic kidney disease: Secondary | ICD-10-CM | POA: Diagnosis not present

## 2018-08-31 DIAGNOSIS — N2581 Secondary hyperparathyroidism of renal origin: Secondary | ICD-10-CM | POA: Diagnosis not present

## 2018-09-02 DIAGNOSIS — D631 Anemia in chronic kidney disease: Secondary | ICD-10-CM | POA: Diagnosis not present

## 2018-09-02 DIAGNOSIS — N2581 Secondary hyperparathyroidism of renal origin: Secondary | ICD-10-CM | POA: Diagnosis not present

## 2018-09-02 DIAGNOSIS — N186 End stage renal disease: Secondary | ICD-10-CM | POA: Diagnosis not present

## 2018-09-02 DIAGNOSIS — D509 Iron deficiency anemia, unspecified: Secondary | ICD-10-CM | POA: Diagnosis not present

## 2018-09-05 DIAGNOSIS — N2581 Secondary hyperparathyroidism of renal origin: Secondary | ICD-10-CM | POA: Diagnosis not present

## 2018-09-05 DIAGNOSIS — D509 Iron deficiency anemia, unspecified: Secondary | ICD-10-CM | POA: Diagnosis not present

## 2018-09-05 DIAGNOSIS — D631 Anemia in chronic kidney disease: Secondary | ICD-10-CM | POA: Diagnosis not present

## 2018-09-05 DIAGNOSIS — N186 End stage renal disease: Secondary | ICD-10-CM | POA: Diagnosis not present

## 2018-09-07 DIAGNOSIS — N186 End stage renal disease: Secondary | ICD-10-CM | POA: Diagnosis not present

## 2018-09-07 DIAGNOSIS — E1122 Type 2 diabetes mellitus with diabetic chronic kidney disease: Secondary | ICD-10-CM | POA: Diagnosis not present

## 2018-09-07 DIAGNOSIS — Z992 Dependence on renal dialysis: Secondary | ICD-10-CM | POA: Diagnosis not present

## 2018-09-08 DIAGNOSIS — D631 Anemia in chronic kidney disease: Secondary | ICD-10-CM | POA: Diagnosis not present

## 2018-09-08 DIAGNOSIS — N186 End stage renal disease: Secondary | ICD-10-CM | POA: Diagnosis not present

## 2018-09-08 DIAGNOSIS — D509 Iron deficiency anemia, unspecified: Secondary | ICD-10-CM | POA: Diagnosis not present

## 2018-09-08 DIAGNOSIS — E1129 Type 2 diabetes mellitus with other diabetic kidney complication: Secondary | ICD-10-CM | POA: Diagnosis not present

## 2018-09-08 DIAGNOSIS — N2581 Secondary hyperparathyroidism of renal origin: Secondary | ICD-10-CM | POA: Diagnosis not present

## 2018-09-09 ENCOUNTER — Other Ambulatory Visit: Payer: Self-pay

## 2018-09-09 ENCOUNTER — Ambulatory Visit (INDEPENDENT_AMBULATORY_CARE_PROVIDER_SITE_OTHER): Payer: Medicare Other | Admitting: Internal Medicine

## 2018-09-09 ENCOUNTER — Encounter: Payer: Self-pay | Admitting: Internal Medicine

## 2018-09-09 VITALS — BP 159/87 | HR 109 | Temp 97.8°F | Ht 72.0 in | Wt 195.7 lb

## 2018-09-09 DIAGNOSIS — I10 Essential (primary) hypertension: Secondary | ICD-10-CM

## 2018-09-09 DIAGNOSIS — M549 Dorsalgia, unspecified: Secondary | ICD-10-CM

## 2018-09-09 DIAGNOSIS — Z952 Presence of prosthetic heart valve: Secondary | ICD-10-CM

## 2018-09-09 DIAGNOSIS — Z79899 Other long term (current) drug therapy: Secondary | ICD-10-CM | POA: Diagnosis not present

## 2018-09-09 DIAGNOSIS — I151 Hypertension secondary to other renal disorders: Secondary | ICD-10-CM

## 2018-09-09 DIAGNOSIS — Z7982 Long term (current) use of aspirin: Secondary | ICD-10-CM

## 2018-09-09 DIAGNOSIS — J90 Pleural effusion, not elsewhere classified: Secondary | ICD-10-CM | POA: Diagnosis not present

## 2018-09-09 DIAGNOSIS — I340 Nonrheumatic mitral (valve) insufficiency: Secondary | ICD-10-CM | POA: Diagnosis not present

## 2018-09-09 DIAGNOSIS — G8929 Other chronic pain: Secondary | ICD-10-CM | POA: Diagnosis not present

## 2018-09-09 DIAGNOSIS — Z1211 Encounter for screening for malignant neoplasm of colon: Secondary | ICD-10-CM

## 2018-09-09 DIAGNOSIS — Z87891 Personal history of nicotine dependence: Secondary | ICD-10-CM | POA: Diagnosis not present

## 2018-09-09 DIAGNOSIS — N2889 Other specified disorders of kidney and ureter: Secondary | ICD-10-CM

## 2018-09-09 MED ORDER — METHOCARBAMOL 500 MG PO TABS: 500.0000 mg | ORAL_TABLET | Freq: Four times a day (QID) | ORAL | 0 refills | Status: AC | PRN

## 2018-09-09 NOTE — Assessment & Plan Note (Signed)
BP today 159/87, improved from previous of 175/82. Though his BP remains elevated this is in the setting of ESRD on HD. His BP has shown to improve following dialysis during his recent inpatient stays; so there is a component of volume overload to his HTN. Given this, will not make any adjustments to current regimen at this time. Patient states he has seen his nephrologist in the past month who is aware of current regimen. - Amlodipine 10mg  Daily - Lisinopril 10mg  Daily

## 2018-09-09 NOTE — Assessment & Plan Note (Signed)
Patient present today with complaint of continued right upper back pain. He is also requesting a new referral to PT because he states he was never contacted. He continues to experience intermitted back pain that has been worse at night in the past. He states the muscle relaxant he received did help him sleep, but he does not feel it did much for his pain.   We were able to confirm that he did have a PT appointment in October, but he may have missed this due to his being in and out of the hospital around that time. A new appointment was scheduled for him in 10 days and he was provided information about that appointment.  A prescription for an alternative muscle relaxant will be provided to see if it will have any significant effect on his pain. If PT fails to improve patients pain will consider referral to sports medicine for further evaluation. - PT - Robaxin 500 q6h PRN, 30 day supply

## 2018-09-09 NOTE — Patient Instructions (Addendum)
Thank you for allowing [place] to care for you  For your back pain - Prescription for Robaxin (muscle relaxant) ordered - Please see PT as scheduled (09/19/18 at 10am, # [phone] ext [phone])  For Blood pressure - Continue current medications  For mitral valve regurgitation - Follow up with Heart Surgeon in [place]  We have sent a referral for a conlonscopy  Please follow up in about 6 months or sooner if needed (if you fail to improve with PT)

## 2018-09-09 NOTE — Progress Notes (Signed)
   CC: Back pain, Hypertension, Mitral valve insufficiency, Healthcare Maintenance  HPI:  [name] is a 62 y.o. M with PMHx listed below presenting for Back pain, Hypertension, Mitral valve insufficiency, Healthcare Maintenance. Please see the A&P for the status of the patient's chronic medical problems.   Past Medical History:  Diagnosis Date  . Anemia   . Anginal pain ([place])   . CAD (coronary artery disease)    a. per [facility] s/p 3.14mm x 67mm Vision BMS to mid LAD 12/2009 and Xience DES to mid LAD 10/2010.  [name] Chronic diastolic CHF (congestive heart failure) ([place])   . Colon polyps   . Daily headache   . ESRD on dialysis [facility]) since ~ 2008   "MWF; [name] [name]" (03/04/2017)  . GERD (gastroesophageal reflux disease)   . H/O TIA (transient ischemic attack) 04/01/2015  . H/O TIA (transient ischemic attack) 04/01/2015  . Heart murmur   . Hematochezia    a. 2014: colonscopy, which showed moderately-sized internal hemorrhoids, two 15mm polyps in transverse colon and ascending colon that were resected, five 2-28mm polyps in sigmoid colon, descending colon, transverse colon, and ascending colon that were resected. An upper endoscopy was performed and showed normal esophagus, stomach, and duodenum.  . Hematuria    a. H/o hematuria 2014 with cystoscopy that was unrevealing for his source of hematuria. He underwent a kidney ultrasound on 10/14 that showed mildly echogenic and scarred kidneys compatible with medical renal disease, without hydronephrosis or renal calculi.  [name] History of blood transfusion    "had colonoscopy done; they had to give me some blood"  . History of kidney stones   . Hyperlipidemia   . Hypertension   . On home oxygen therapy    "2L prn" (07/21/2015); "been off it for awhile" (03/04/2017)  . Pneumonia   . Renal insufficiency   . 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 Type II diabetes mellitus ([place])   . Wears dentures    Review of  Systems:  Performed and all others negative.  Physical Exam:  Vitals:   09/09/18 1551  BP: (!) 159/87  Pulse: (!) 109  Temp: 97.8 F (36.6 C)  TempSrc: Oral  SpO2: 95%  Weight: 195 lb 11.2 oz (88.8 kg)  Height: 6' (1.829 m)   Physical Exam  Constitutional: He appears well-developed and well-nourished.  Cardiovascular: Normal rate, regular rhythm, normal heart sounds and intact distal pulses.  Pulmonary/Chest: Effort normal and breath sounds normal. No respiratory distress.  Abdominal: Soft. Bowel sounds are normal. He exhibits no distension. There is no tenderness.  Musculoskeletal: He exhibits no edema or deformity.  Mild tenderness on palpation of musculature overlying R scapula.  Skin: Skin is warm and dry.    Assessment & Plan:   See Encounters Tab for problem based charting.  Patient discussed with Dr. Angelia Mould

## 2018-09-09 NOTE — Assessment & Plan Note (Signed)
Patient was admitted to the hospital in October 2019 with volume overload and SOB with pulmonary edema. His evaluation showed regurgitation of his recently replaced bioprosthetic mitral valve. He has been instructed to follow up with his cardiac surgeon in [place] rewarding this. He states he has been unable to arrange an appointment, but he and his significant other state they are working on this. On exam he continues to have reduced breath sounds at his left base consistent with his chronic Effusion. Denies shortness of breath. - Follow up with Cardiac Surgeon

## 2018-09-10 DIAGNOSIS — N186 End stage renal disease: Secondary | ICD-10-CM | POA: Diagnosis not present

## 2018-09-10 DIAGNOSIS — D631 Anemia in chronic kidney disease: Secondary | ICD-10-CM | POA: Diagnosis not present

## 2018-09-10 DIAGNOSIS — E1129 Type 2 diabetes mellitus with other diabetic kidney complication: Secondary | ICD-10-CM | POA: Diagnosis not present

## 2018-09-10 DIAGNOSIS — D509 Iron deficiency anemia, unspecified: Secondary | ICD-10-CM | POA: Diagnosis not present

## 2018-09-10 DIAGNOSIS — N2581 Secondary hyperparathyroidism of renal origin: Secondary | ICD-10-CM | POA: Diagnosis not present

## 2018-09-11 ENCOUNTER — Ambulatory Visit: Payer: Medicare Other | Admitting: Licensed Clinical Social Worker

## 2018-09-11 NOTE — Progress Notes (Signed)
Internal Medicine Clinic Attending  Case discussed with Dr. Melvin  at the time of the visit.  We reviewed the resident's history and exam and pertinent patient test results.  I agree with the assessment, diagnosis, and plan of care documented in the resident's note.  

## 2018-09-12 DIAGNOSIS — D631 Anemia in chronic kidney disease: Secondary | ICD-10-CM | POA: Diagnosis not present

## 2018-09-12 DIAGNOSIS — N186 End stage renal disease: Secondary | ICD-10-CM | POA: Diagnosis not present

## 2018-09-12 DIAGNOSIS — D509 Iron deficiency anemia, unspecified: Secondary | ICD-10-CM | POA: Diagnosis not present

## 2018-09-12 DIAGNOSIS — N2581 Secondary hyperparathyroidism of renal origin: Secondary | ICD-10-CM | POA: Diagnosis not present

## 2018-09-12 DIAGNOSIS — E1129 Type 2 diabetes mellitus with other diabetic kidney complication: Secondary | ICD-10-CM | POA: Diagnosis not present

## 2018-09-15 DIAGNOSIS — N2581 Secondary hyperparathyroidism of renal origin: Secondary | ICD-10-CM | POA: Diagnosis not present

## 2018-09-15 DIAGNOSIS — D509 Iron deficiency anemia, unspecified: Secondary | ICD-10-CM | POA: Diagnosis not present

## 2018-09-15 DIAGNOSIS — E1129 Type 2 diabetes mellitus with other diabetic kidney complication: Secondary | ICD-10-CM | POA: Diagnosis not present

## 2018-09-15 DIAGNOSIS — N186 End stage renal disease: Secondary | ICD-10-CM | POA: Diagnosis not present

## 2018-09-15 DIAGNOSIS — D631 Anemia in chronic kidney disease: Secondary | ICD-10-CM | POA: Diagnosis not present

## 2018-09-17 DIAGNOSIS — N2581 Secondary hyperparathyroidism of renal origin: Secondary | ICD-10-CM | POA: Diagnosis not present

## 2018-09-17 DIAGNOSIS — D509 Iron deficiency anemia, unspecified: Secondary | ICD-10-CM | POA: Diagnosis not present

## 2018-09-17 DIAGNOSIS — N186 End stage renal disease: Secondary | ICD-10-CM | POA: Diagnosis not present

## 2018-09-17 DIAGNOSIS — D631 Anemia in chronic kidney disease: Secondary | ICD-10-CM | POA: Diagnosis not present

## 2018-09-17 DIAGNOSIS — E1129 Type 2 diabetes mellitus with other diabetic kidney complication: Secondary | ICD-10-CM | POA: Diagnosis not present

## 2018-09-19 ENCOUNTER — Ambulatory Visit: Payer: Medicare Other | Admitting: Physical Therapy

## 2018-09-19 DIAGNOSIS — D631 Anemia in chronic kidney disease: Secondary | ICD-10-CM | POA: Diagnosis not present

## 2018-09-19 DIAGNOSIS — E1129 Type 2 diabetes mellitus with other diabetic kidney complication: Secondary | ICD-10-CM | POA: Diagnosis not present

## 2018-09-19 DIAGNOSIS — N186 End stage renal disease: Secondary | ICD-10-CM | POA: Diagnosis not present

## 2018-09-19 DIAGNOSIS — D509 Iron deficiency anemia, unspecified: Secondary | ICD-10-CM | POA: Diagnosis not present

## 2018-09-19 DIAGNOSIS — N2581 Secondary hyperparathyroidism of renal origin: Secondary | ICD-10-CM | POA: Diagnosis not present

## 2018-09-22 DIAGNOSIS — N2581 Secondary hyperparathyroidism of renal origin: Secondary | ICD-10-CM | POA: Diagnosis not present

## 2018-09-22 DIAGNOSIS — D509 Iron deficiency anemia, unspecified: Secondary | ICD-10-CM | POA: Diagnosis not present

## 2018-09-22 DIAGNOSIS — N186 End stage renal disease: Secondary | ICD-10-CM | POA: Diagnosis not present

## 2018-09-22 DIAGNOSIS — E1129 Type 2 diabetes mellitus with other diabetic kidney complication: Secondary | ICD-10-CM | POA: Diagnosis not present

## 2018-09-22 DIAGNOSIS — D631 Anemia in chronic kidney disease: Secondary | ICD-10-CM | POA: Diagnosis not present

## 2018-09-23 ENCOUNTER — Telehealth: Payer: Self-pay | Admitting: Licensed Clinical Social Worker

## 2018-09-23 NOTE — Telephone Encounter (Signed)
Patient was contacted to follow up on a referral for psychiatry. Patient was referred on 09/01/18, and was contacted to see if an appointment was scheduled. Patient did not answer, and a voicemail was left for the patient.

## 2018-09-24 DIAGNOSIS — D509 Iron deficiency anemia, unspecified: Secondary | ICD-10-CM | POA: Diagnosis not present

## 2018-09-24 DIAGNOSIS — N2581 Secondary hyperparathyroidism of renal origin: Secondary | ICD-10-CM | POA: Diagnosis not present

## 2018-09-24 DIAGNOSIS — N186 End stage renal disease: Secondary | ICD-10-CM | POA: Diagnosis not present

## 2018-09-24 DIAGNOSIS — E1129 Type 2 diabetes mellitus with other diabetic kidney complication: Secondary | ICD-10-CM | POA: Diagnosis not present

## 2018-09-24 DIAGNOSIS — D631 Anemia in chronic kidney disease: Secondary | ICD-10-CM | POA: Diagnosis not present

## 2018-09-25 ENCOUNTER — Encounter: Payer: Self-pay | Admitting: Internal Medicine

## 2018-09-25 ENCOUNTER — Ambulatory Visit: Payer: Medicare Other | Admitting: Licensed Clinical Social Worker

## 2018-09-25 ENCOUNTER — Ambulatory Visit
Payer: Medicare Other | Attending: Student in an Organized Health Care Education/Training Program | Admitting: Physical Therapy

## 2018-09-26 DIAGNOSIS — D631 Anemia in chronic kidney disease: Secondary | ICD-10-CM | POA: Diagnosis not present

## 2018-09-26 DIAGNOSIS — N2581 Secondary hyperparathyroidism of renal origin: Secondary | ICD-10-CM | POA: Diagnosis not present

## 2018-09-26 DIAGNOSIS — N186 End stage renal disease: Secondary | ICD-10-CM | POA: Diagnosis not present

## 2018-09-26 DIAGNOSIS — E1129 Type 2 diabetes mellitus with other diabetic kidney complication: Secondary | ICD-10-CM | POA: Diagnosis not present

## 2018-09-26 DIAGNOSIS — D509 Iron deficiency anemia, unspecified: Secondary | ICD-10-CM | POA: Diagnosis not present

## 2018-09-28 DIAGNOSIS — N186 End stage renal disease: Secondary | ICD-10-CM | POA: Diagnosis not present

## 2018-09-28 DIAGNOSIS — E1129 Type 2 diabetes mellitus with other diabetic kidney complication: Secondary | ICD-10-CM | POA: Diagnosis not present

## 2018-09-28 DIAGNOSIS — N2581 Secondary hyperparathyroidism of renal origin: Secondary | ICD-10-CM | POA: Diagnosis not present

## 2018-09-28 DIAGNOSIS — D631 Anemia in chronic kidney disease: Secondary | ICD-10-CM | POA: Diagnosis not present

## 2018-09-28 DIAGNOSIS — D509 Iron deficiency anemia, unspecified: Secondary | ICD-10-CM | POA: Diagnosis not present

## 2018-09-30 ENCOUNTER — Ambulatory Visit: Payer: Medicare Other | Admitting: Physical Therapy

## 2018-09-30 DIAGNOSIS — D509 Iron deficiency anemia, unspecified: Secondary | ICD-10-CM | POA: Diagnosis not present

## 2018-09-30 DIAGNOSIS — E1129 Type 2 diabetes mellitus with other diabetic kidney complication: Secondary | ICD-10-CM | POA: Diagnosis not present

## 2018-09-30 DIAGNOSIS — N186 End stage renal disease: Secondary | ICD-10-CM | POA: Diagnosis not present

## 2018-09-30 DIAGNOSIS — D631 Anemia in chronic kidney disease: Secondary | ICD-10-CM | POA: Diagnosis not present

## 2018-09-30 DIAGNOSIS — N2581 Secondary hyperparathyroidism of renal origin: Secondary | ICD-10-CM | POA: Diagnosis not present

## 2018-10-03 DIAGNOSIS — N186 End stage renal disease: Secondary | ICD-10-CM | POA: Diagnosis not present

## 2018-10-03 DIAGNOSIS — N2581 Secondary hyperparathyroidism of renal origin: Secondary | ICD-10-CM | POA: Diagnosis not present

## 2018-10-03 DIAGNOSIS — D509 Iron deficiency anemia, unspecified: Secondary | ICD-10-CM | POA: Diagnosis not present

## 2018-10-03 DIAGNOSIS — E1129 Type 2 diabetes mellitus with other diabetic kidney complication: Secondary | ICD-10-CM | POA: Diagnosis not present

## 2018-10-03 DIAGNOSIS — D631 Anemia in chronic kidney disease: Secondary | ICD-10-CM | POA: Diagnosis not present

## 2018-10-05 ENCOUNTER — Encounter (HOSPITAL_COMMUNITY): Payer: Self-pay | Admitting: Family Medicine

## 2018-10-05 ENCOUNTER — Ambulatory Visit (HOSPITAL_COMMUNITY)
Admission: EM | Admit: 2018-10-05 | Discharge: 2018-10-05 | Disposition: A | Discharge status: Home / Self Care | Payer: Medicare Other | Attending: Family Medicine | Admitting: Family Medicine

## 2018-10-05 DIAGNOSIS — E11621 Type 2 diabetes mellitus with foot ulcer: Secondary | ICD-10-CM

## 2018-10-05 DIAGNOSIS — L97511 Non-pressure chronic ulcer of other part of right foot limited to breakdown of skin: Secondary | ICD-10-CM

## 2018-10-05 MED ORDER — DOXYCYCLINE HYCLATE 100 MG PO TABS: 100.0000 mg | ORAL_TABLET | Freq: Every day | ORAL | 0 refills | Status: DC

## 2018-10-05 MED ORDER — HYDROCODONE-ACETAMINOPHEN 5-325 MG PO TABS: 1.0000 | ORAL_TABLET | Freq: Four times a day (QID) | ORAL | 0 refills | Status: DC | PRN

## 2018-10-05 MED ORDER — MUPIROCIN 2 % EX OINT: 1.0000 "application " | TOPICAL_OINTMENT | Freq: Three times a day (TID) | CUTANEOUS | 1 refills | Status: AC

## 2018-10-05 NOTE — ED Provider Notes (Signed)
[place]    CSN: [id number] Arrival date & time: 10/05/18  1348     History   Chief Complaint Chief Complaint  Patient presents with  . Toe issue    HPI [name] [name] is a 62 y.o. male.   Is a 62 year old established patient who comes in with toe pain.  He is a diabetic with dialysis.  He has had the right great toe ulcer for about 2 weeks.  He has not told his primary care doctor nor has he told his dialysis doctors about this.     Past Medical History:  Diagnosis Date  . Anemia   . Anginal pain ([place])   . CAD (coronary artery disease)    a. per [facility] s/p 3.71mm x 25mm Vision BMS to mid LAD 12/2009 and Xience DES to mid LAD 10/2010.  [name] Chronic diastolic CHF (congestive heart failure) ([place])   . Colon polyps   . Daily headache   . ESRD on dialysis [facility]) since ~ 2008   "MWF; Jeneen Rinks" (03/04/2017)  . GERD (gastroesophageal reflux disease)   . H/O TIA (transient ischemic attack) 04/01/2015  . H/O TIA (transient ischemic attack) 04/01/2015  . Heart murmur   . Hematochezia    a. 2014: colonscopy, which showed moderately-sized internal hemorrhoids, two 80mm polyps in transverse colon and ascending colon that were resected, five 2-72mm polyps in sigmoid colon, descending colon, transverse colon, and ascending colon that were resected. An upper endoscopy was performed and showed normal esophagus, stomach, and duodenum.  . Hematuria    a. H/o hematuria 2014 with cystoscopy that was unrevealing for his source of hematuria. He underwent a kidney ultrasound on 10/14 that showed mildly echogenic and scarred kidneys compatible with medical renal disease, without hydronephrosis or renal calculi.  [name] History of blood transfusion    "had colonoscopy done; they had to give me some blood"  . History of kidney stones   . Hyperlipidemia   . Hypertension   . On home oxygen therapy    "2L prn" (07/21/2015); "been off it for awhile" (03/04/2017)  . Pneumonia   . Renal  insufficiency   . 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 Type II diabetes mellitus ([place])   . Wears dentures     Patient Active Problem List   Diagnosis Date Noted  . Chronic upper back pain 08/12/2018  . Pulmonary edema with congestive heart failure ([place]) 08/04/2018  . Hypoglycemia 07/24/2018  . Muscle strain of right upper back 07/22/2018  . Shortness of breath 07/21/2018  . Mitral stenosis 07/21/2018  . Shoulder pain, right 07/10/2018  . Pleural effusion on left   . ESRD on dialysis ([place])   . SOB (shortness of breath) 06/06/2018  . Critical lower limb ischemia 05/27/2018  . Hx of CABG 02/02/2018  . Hypervolemia associated with renal insufficiency 01/26/2018  . Restless leg syndrome 01/14/2018  . Anemia 01/14/2018  . Tobacco use 10/29/2017  . Mild cognitive impairment 06/13/2017  . Chest pain 06/07/2017  . Skin ulcer of toe of right foot, limited to breakdown of skin ([place])   . Callus of foot 03/07/2017  . Pulmonary hypertension ([place]) 07/28/2016  . Stroke-like symptoms 06/30/2016  . Peripheral neuropathy 05/07/2016  . S/P coronary artery stent placement 05/07/2016  . Nausea and vomiting 04/02/2016  . ESRD (end stage renal disease) ([place]) 03/18/2016  . Mitral regurgitation   . Acute pulmonary edema (H[facility])   . Diabetic  neuropathy ([place]) 07/29/2015  . Depression 07/22/2015  . Elevated troponin 06/04/2015  . GERD (gastroesophageal reflux disease) 06/04/2015  . Malnutrition of moderate degree ([place]) 05/17/2015  . Thrombocytopenia ([place]) 05/16/2015  . Weight loss 05/16/2015  . Diastolic dysfunction-grade 2 12/13/2014  . 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 Arm pain, right 05/25/2014  . Numbness 05/25/2014  . Hypertension 04/27/2014  . CAD -S/P LAD BMS 2011, LAD DES 2012- patent cors Feb 2016 04/27/2014  . History of diabetes mellitus, type II 04/27/2014  . Background diabetic retinopathy ([place]) 08/13/2012  . Primary localized  osteoarthrosis, lower leg 04/15/2012  . Senile nuclear sclerosis 11/29/2010  . Vitreous hemorrhage ([place]) 11/29/2010  . Atherosclerotic heart disease of native coronary artery without angina pectoris 12/12/2009  . Hypercholesterolemia 11/30/2003    Past Surgical History:  Procedure Laterality Date  . ABDOMINAL AORTOGRAM W/LOWER EXTREMITY N/A 05/29/2018   Procedure: ABDOMINAL AORTOGRAM W/LOWER EXTREMITY;  Surgeon: Lorretta Harp, MD;  Location: [place] [facility] CV LAB;  Service: Cardiovascular;  Laterality: N/A;  . AV FISTULA PLACEMENT Left ~ 2007   "upper arm"  . CARDIAC CATHETERIZATION  "several"  . CORONARY ANGIOPLASTY WITH STENT PLACEMENT  "several"  . CORONARY ARTERY BYPASS GRAFT     3 grafts  . CYSTOSCOPY W/ STONE MANIPULATION  X2?  . EYE SURGERY Bilateral    "laser OR for hemorrhage"  . IR THORACENTESIS ASP PLEURAL SPACE W/IMG GUIDE  07/09/2018  . LEFT HEART CATHETERIZATION WITH CORONARY ANGIOGRAM N/A 11/23/2014   Procedure: LEFT HEART CATHETERIZATION WITH CORONARY ANGIOGRAM;  Surgeon: Troy Sine, MD;  Location: [facility] CATH LAB;  Service: Cardiovascular;  Laterality: N/A;  . LITHOTRIPSY  X1  . MITRAL VALVE REPLACEMENT    . REVISON OF ARTERIOVENOUS FISTULA Left 08/04/2535   Procedure: PLICATION OF LEFT ARM ARTERIOVENOUS FISTULA;  Surgeon: Angelia Mould, MD;  Location: [facility];  Service: Vascular;  Laterality: Left;  . REVISON OF ARTERIOVENOUS FISTULA Left 6/44/0347   Procedure: PLICATE ANEURYSM  OF LEFT ARTERIOVENOUS FISTULA;  Surgeon: Waynetta Sandy, MD;  Location: [facility];  Service: Vascular;  Laterality: Left;  . TEE WITHOUT CARDIOVERSION N/A 07/23/2018   Procedure: TRANSESOPHAGEAL ECHOCARDIOGRAM (TEE);  Surgeon: Sueanne Margarita, MD;  Location: [facility] ENDOSCOPY;  Service: Cardiovascular;  Laterality: N/A;       Home Medications    Prior to Admission medications   Medication Sig Start Date End Date Taking? Authorizing Provider  ACCU-CHEK FASTCLIX LANCETS MISC  Check up to seven times per week 08/08/18   Lucious Groves, DO  albuterol (PROVENTIL HFA;VENTOLIN HFA) 108 (90 Base) MCG/ACT inhaler Inhale 2 puffs into the lungs every 6 (six) hours as needed for wheezing or shortness of breath. 06/07/18   Neva Seat, MD  amLODipine (NORVASC) 10 MG tablet Take 1 tablet (10 mg total) by mouth at bedtime. 04/17/18   Neva Seat, MD  aspirin EC 81 MG tablet Take 81 mg by mouth daily.    [provider]  atorvastatin (LIPITOR) 20 MG tablet Take 1 tablet (20 mg total) by mouth at bedtime. 07/31/18   Neva Seat, MD  cinacalcet [facility]) 90 MG tablet Take 180 mg by mouth every Monday, Wednesday, and Friday with hemodialysis.     [provider]  cyclobenzaprine (FLEXERIL) 5 MG tablet Take 1 tablet (5 mg total) by mouth at bedtime as needed for muscle spasms. 08/12/18   Asencion Noble, MD  doxycycline (VIBRA-TABS) 100 MG tablet Take 1 tablet (100 mg total) by mouth daily. 10/05/18  Robyn Haber, MD  glucose blood (ACCU-CHEK GUIDE) test strip Check up to seven times per week. Dx code E11.65. 08/08/18   Lucious Groves, DO  isosorbide mononitrate (IMDUR) 30 MG 24 hr tablet Take 1 tablet (30 mg total) by mouth daily. 07/10/18 07/10/19  Neva Seat, MD  lisinopril (PRINIVIL,ZESTRIL) 10 MG tablet Take 2 tablets (20 mg total) by mouth daily. 08/12/18   Asencion Noble, MD  methocarbamol (ROBAXIN) 500 MG tablet Take 1 tablet (500 mg total) by mouth every 6 (six) hours as needed for muscle spasms. 09/09/18 10/09/18  Neva Seat, MD  multivitamin (RENA-VIT) TABS tablet TAKE 1 TABLET BY MOUTH ONCE DAILY AT BEDTIME Patient taking differently: Take 1 tablet by mouth daily.  07/02/18   Neva Seat, MD  mupirocin ointment (BACTROBAN) 2 % Apply 1 application topically 3 (three) times daily. 10/05/18   Robyn Haber, MD  nitroGLYCERIN (NITROSTAT) 0.4 MG SL tablet Place 1 tablet (0.4 mg total) under the tongue every 5 (five)  minutes as needed for chest pain. 07/10/18   Neva Seat, MD  ondansetron (ZOFRAN) 4 MG tablet Take 1 tablet (4 mg total) by mouth 4 (four) times daily as needed for nausea or vomiting. 03/04/18   Wynona Luna, MD  pregabalin (LYRICA) 50 MG capsule Take 1 capsule (50 mg total) by mouth at bedtime. 08/08/18 08/08/19  Seawell, Jaimie A, DO  rOPINIRole (REQUIP) 0.25 MG tablet Take 1 tablet (0.25 mg total) by mouth at bedtime. 02/25/18   Bartholomew Crews, MD  sevelamer carbonate (RENVELA) 800 MG tablet Take 4 tablets (3,200 mg total) by mouth 3 (three) times daily with meals. Patient taking differently: Take 1,600-3,200 mg by mouth See admin instructions. Take 4 tablets (3200 mg) by mouth three times daily with meals and 2 tablets (1600 mg) with snacks 03/12/16   Eulogio Bear U, DO  traMADol (ULTRAM) 50 MG tablet Take 50 mg by mouth every 6 (six) hours as needed for moderate pain.    [provider]    Family History Family History  Problem Relation Age of Onset  . Bone cancer Mother   . Anuerysm Father   . Hypertension Other   . Diabetes type II Daughter   . Ovarian cancer Sister     Social History Social History   Tobacco Use  . Smoking status: Former Smoker    Packs/day: 0.30    Years: 8.00    Pack years: 2.40    Types: Cigars  . Smokeless tobacco: Never Used  . Tobacco comment: black and mild every 3-4 days  Substance Use Topics  . Alcohol use: No    Alcohol/week: 0.0 standard drinks  . Drug use: No     Allergies   Enalapril; Latex; and Tape   Review of Systems Review of Systems   Physical Exam Triage Vital Signs ED Triage Vitals [10/05/18 1510]  Enc Vitals Group     BP (!) 153/75     Pulse Rate 97     Resp 20     Temp 98.4 F (36.9 C)     Temp Source Oral     SpO2 100 %     Weight      Height      Head Circumference      Peak Flow      Pain Score      Pain Loc      Pain Edu?      Excl. in [place]?    No data found.  Updated Vital  Signs BP (!) 153/75 (BP Location: Right Arm)   Pulse 97   Temp 98.4 F (36.9 C) (Oral)   Resp 20   SpO2 100%    Physical Exam Vitals signs and nursing note reviewed.  Constitutional:      Appearance: Normal appearance.  HENT:     Head: Normocephalic.  Neck:     Musculoskeletal: Normal range of motion and neck supple.  Pulmonary:     Effort: Pulmonary effort is normal.  Neurological:     Mental Status: He is alert.        UC Treatments / Results  Labs (all labs ordered are listed, but only abnormal results are displayed) Labs Reviewed - No data to display  EKG None  Radiology No results found.  Procedures Procedures (including critical care time)  Medications Ordered in UC Medications - No data to display  Initial Impression / Assessment and Plan / UC Course  I have reviewed the triage vital signs and the nursing notes.  Pertinent labs & imaging results that were available during my care of the patient were reviewed by me and considered in my medical decision making (see chart for details).    Final Clinical Impressions(s) / UC Diagnoses   Final diagnoses:  Diabetic ulcer of toe of right foot associated with type 2 diabetes mellitus, limited to breakdown of skin [facility])     Discharge Instructions     Soak the foot twice a day and soapy water  Call the wound clinic tomorrow for an appointment.  Call your primary care doctor to let them know about your toe problem.    ED Prescriptions    Medication Sig Dispense Auth. Provider   doxycycline (VIBRA-TABS) 100 MG tablet Take 1 tablet (100 mg total) by mouth daily. 20 tablet Robyn Haber, MD   mupirocin ointment (BACTROBAN) 2 % Apply 1 application topically 3 (three) times daily. 22 g Robyn Haber, MD     Controlled Substance Prescriptions [place] Controlled Substance Registry consulted? Not Applicable   Robyn Haber, MD 10/05/18 1527

## 2018-10-05 NOTE — ED Triage Notes (Signed)
Pt presents with right toe pain, swelling and cracking not due to any injury.  Pt is diabetic and has been dealing with issue with toe for over a month.

## 2018-10-05 NOTE — Discharge Instructions (Addendum)
Soak the foot twice a day and soapy water  Call the wound clinic tomorrow for an appointment.  Call your primary care doctor to let them know about your toe problem.

## 2018-10-06 DIAGNOSIS — N186 End stage renal disease: Secondary | ICD-10-CM | POA: Diagnosis not present

## 2018-10-06 DIAGNOSIS — N2581 Secondary hyperparathyroidism of renal origin: Secondary | ICD-10-CM | POA: Diagnosis not present

## 2018-10-06 DIAGNOSIS — D631 Anemia in chronic kidney disease: Secondary | ICD-10-CM | POA: Diagnosis not present

## 2018-10-06 DIAGNOSIS — E1129 Type 2 diabetes mellitus with other diabetic kidney complication: Secondary | ICD-10-CM | POA: Diagnosis not present

## 2018-10-06 DIAGNOSIS — D509 Iron deficiency anemia, unspecified: Secondary | ICD-10-CM | POA: Diagnosis not present

## 2018-10-07 ENCOUNTER — Encounter (HOSPITAL_BASED_OUTPATIENT_CLINIC_OR_DEPARTMENT_OTHER): Payer: Medicare Other | Attending: Internal Medicine

## 2018-10-07 DIAGNOSIS — N2581 Secondary hyperparathyroidism of renal origin: Secondary | ICD-10-CM | POA: Diagnosis not present

## 2018-10-07 DIAGNOSIS — E1151 Type 2 diabetes mellitus with diabetic peripheral angiopathy without gangrene: Secondary | ICD-10-CM | POA: Diagnosis not present

## 2018-10-07 DIAGNOSIS — E11621 Type 2 diabetes mellitus with foot ulcer: Secondary | ICD-10-CM | POA: Insufficient documentation

## 2018-10-07 DIAGNOSIS — D631 Anemia in chronic kidney disease: Secondary | ICD-10-CM | POA: Diagnosis not present

## 2018-10-07 DIAGNOSIS — I2510 Atherosclerotic heart disease of native coronary artery without angina pectoris: Secondary | ICD-10-CM | POA: Insufficient documentation

## 2018-10-07 DIAGNOSIS — D509 Iron deficiency anemia, unspecified: Secondary | ICD-10-CM | POA: Diagnosis not present

## 2018-10-07 DIAGNOSIS — L97512 Non-pressure chronic ulcer of other part of right foot with fat layer exposed: Secondary | ICD-10-CM | POA: Diagnosis not present

## 2018-10-07 DIAGNOSIS — L97511 Non-pressure chronic ulcer of other part of right foot limited to breakdown of skin: Secondary | ICD-10-CM | POA: Insufficient documentation

## 2018-10-07 DIAGNOSIS — N186 End stage renal disease: Secondary | ICD-10-CM | POA: Diagnosis not present

## 2018-10-07 DIAGNOSIS — Z992 Dependence on renal dialysis: Secondary | ICD-10-CM | POA: Diagnosis not present

## 2018-10-07 DIAGNOSIS — E1140 Type 2 diabetes mellitus with diabetic neuropathy, unspecified: Secondary | ICD-10-CM | POA: Diagnosis not present

## 2018-10-07 DIAGNOSIS — I132 Hypertensive heart and chronic kidney disease with heart failure and with stage 5 chronic kidney disease, or end stage renal disease: Secondary | ICD-10-CM | POA: Insufficient documentation

## 2018-10-07 DIAGNOSIS — E1129 Type 2 diabetes mellitus with other diabetic kidney complication: Secondary | ICD-10-CM | POA: Diagnosis not present

## 2018-10-07 DIAGNOSIS — I509 Heart failure, unspecified: Secondary | ICD-10-CM | POA: Insufficient documentation

## 2018-10-07 DIAGNOSIS — E1122 Type 2 diabetes mellitus with diabetic chronic kidney disease: Secondary | ICD-10-CM | POA: Insufficient documentation

## 2018-10-08 DIAGNOSIS — N186 End stage renal disease: Secondary | ICD-10-CM | POA: Diagnosis not present

## 2018-10-08 DIAGNOSIS — Z992 Dependence on renal dialysis: Secondary | ICD-10-CM | POA: Diagnosis not present

## 2018-10-08 DIAGNOSIS — E1122 Type 2 diabetes mellitus with diabetic chronic kidney disease: Secondary | ICD-10-CM | POA: Diagnosis not present

## 2018-10-10 ENCOUNTER — Other Ambulatory Visit: Payer: Self-pay | Admitting: Internal Medicine

## 2018-10-10 DIAGNOSIS — D509 Iron deficiency anemia, unspecified: Secondary | ICD-10-CM | POA: Diagnosis not present

## 2018-10-10 DIAGNOSIS — D631 Anemia in chronic kidney disease: Secondary | ICD-10-CM | POA: Diagnosis not present

## 2018-10-10 DIAGNOSIS — N2581 Secondary hyperparathyroidism of renal origin: Secondary | ICD-10-CM | POA: Diagnosis not present

## 2018-10-10 DIAGNOSIS — N186 End stage renal disease: Secondary | ICD-10-CM | POA: Diagnosis not present

## 2018-10-10 DIAGNOSIS — E1129 Type 2 diabetes mellitus with other diabetic kidney complication: Secondary | ICD-10-CM | POA: Diagnosis not present

## 2018-10-10 DIAGNOSIS — G2581 Restless legs syndrome: Secondary | ICD-10-CM

## 2018-10-10 NOTE — Telephone Encounter (Signed)
Refill approved.

## 2018-10-13 ENCOUNTER — Other Ambulatory Visit: Payer: Self-pay | Admitting: Internal Medicine

## 2018-10-13 DIAGNOSIS — N186 End stage renal disease: Secondary | ICD-10-CM | POA: Diagnosis not present

## 2018-10-13 DIAGNOSIS — D631 Anemia in chronic kidney disease: Secondary | ICD-10-CM | POA: Diagnosis not present

## 2018-10-13 DIAGNOSIS — E1129 Type 2 diabetes mellitus with other diabetic kidney complication: Secondary | ICD-10-CM | POA: Diagnosis not present

## 2018-10-13 DIAGNOSIS — D509 Iron deficiency anemia, unspecified: Secondary | ICD-10-CM | POA: Diagnosis not present

## 2018-10-13 DIAGNOSIS — N2581 Secondary hyperparathyroidism of renal origin: Secondary | ICD-10-CM | POA: Diagnosis not present

## 2018-10-15 DIAGNOSIS — N186 End stage renal disease: Secondary | ICD-10-CM | POA: Diagnosis not present

## 2018-10-15 DIAGNOSIS — N2581 Secondary hyperparathyroidism of renal origin: Secondary | ICD-10-CM | POA: Diagnosis not present

## 2018-10-15 DIAGNOSIS — D509 Iron deficiency anemia, unspecified: Secondary | ICD-10-CM | POA: Diagnosis not present

## 2018-10-15 DIAGNOSIS — E1129 Type 2 diabetes mellitus with other diabetic kidney complication: Secondary | ICD-10-CM | POA: Diagnosis not present

## 2018-10-15 DIAGNOSIS — D631 Anemia in chronic kidney disease: Secondary | ICD-10-CM | POA: Diagnosis not present

## 2018-10-15 NOTE — Addendum Note (Signed)
Addended by: Truddie Crumble on: 10/15/2018 04:17 PM   Modules accepted: Orders

## 2018-10-16 ENCOUNTER — Encounter (HOSPITAL_BASED_OUTPATIENT_CLINIC_OR_DEPARTMENT_OTHER): Payer: Medicare Other | Attending: Internal Medicine

## 2018-10-16 ENCOUNTER — Encounter (HOSPITAL_BASED_OUTPATIENT_CLINIC_OR_DEPARTMENT_OTHER): Payer: Medicare Other

## 2018-10-16 ENCOUNTER — Telehealth: Payer: Self-pay | Admitting: Licensed Clinical Social Worker

## 2018-10-16 DIAGNOSIS — Z992 Dependence on renal dialysis: Secondary | ICD-10-CM | POA: Insufficient documentation

## 2018-10-16 DIAGNOSIS — E1122 Type 2 diabetes mellitus with diabetic chronic kidney disease: Secondary | ICD-10-CM | POA: Insufficient documentation

## 2018-10-16 DIAGNOSIS — I120 Hypertensive chronic kidney disease with stage 5 chronic kidney disease or end stage renal disease: Secondary | ICD-10-CM | POA: Insufficient documentation

## 2018-10-16 DIAGNOSIS — N186 End stage renal disease: Secondary | ICD-10-CM | POA: Insufficient documentation

## 2018-10-16 DIAGNOSIS — E1151 Type 2 diabetes mellitus with diabetic peripheral angiopathy without gangrene: Secondary | ICD-10-CM | POA: Insufficient documentation

## 2018-10-16 DIAGNOSIS — L97512 Non-pressure chronic ulcer of other part of right foot with fat layer exposed: Secondary | ICD-10-CM | POA: Insufficient documentation

## 2018-10-16 DIAGNOSIS — E1140 Type 2 diabetes mellitus with diabetic neuropathy, unspecified: Secondary | ICD-10-CM | POA: Insufficient documentation

## 2018-10-16 DIAGNOSIS — E11621 Type 2 diabetes mellitus with foot ulcer: Secondary | ICD-10-CM | POA: Insufficient documentation

## 2018-10-16 DIAGNOSIS — I2510 Atherosclerotic heart disease of native coronary artery without angina pectoris: Secondary | ICD-10-CM | POA: Insufficient documentation

## 2018-10-16 NOTE — Telephone Encounter (Signed)
Patient's phone call was returned to provide information about the location of his referral. I provided the phone number and address of the office he was referred to for psychiatry.

## 2018-10-17 DIAGNOSIS — E11621 Type 2 diabetes mellitus with foot ulcer: Secondary | ICD-10-CM | POA: Diagnosis not present

## 2018-10-17 DIAGNOSIS — I2510 Atherosclerotic heart disease of native coronary artery without angina pectoris: Secondary | ICD-10-CM | POA: Diagnosis not present

## 2018-10-17 DIAGNOSIS — E1122 Type 2 diabetes mellitus with diabetic chronic kidney disease: Secondary | ICD-10-CM | POA: Diagnosis not present

## 2018-10-17 DIAGNOSIS — I120 Hypertensive chronic kidney disease with stage 5 chronic kidney disease or end stage renal disease: Secondary | ICD-10-CM | POA: Diagnosis not present

## 2018-10-17 DIAGNOSIS — Z992 Dependence on renal dialysis: Secondary | ICD-10-CM | POA: Diagnosis not present

## 2018-10-17 DIAGNOSIS — E1151 Type 2 diabetes mellitus with diabetic peripheral angiopathy without gangrene: Secondary | ICD-10-CM | POA: Diagnosis not present

## 2018-10-17 DIAGNOSIS — E1129 Type 2 diabetes mellitus with other diabetic kidney complication: Secondary | ICD-10-CM | POA: Diagnosis not present

## 2018-10-17 DIAGNOSIS — L97512 Non-pressure chronic ulcer of other part of right foot with fat layer exposed: Secondary | ICD-10-CM | POA: Diagnosis not present

## 2018-10-17 DIAGNOSIS — N2581 Secondary hyperparathyroidism of renal origin: Secondary | ICD-10-CM | POA: Diagnosis not present

## 2018-10-17 DIAGNOSIS — N186 End stage renal disease: Secondary | ICD-10-CM | POA: Diagnosis not present

## 2018-10-17 DIAGNOSIS — D509 Iron deficiency anemia, unspecified: Secondary | ICD-10-CM | POA: Diagnosis not present

## 2018-10-17 DIAGNOSIS — E1140 Type 2 diabetes mellitus with diabetic neuropathy, unspecified: Secondary | ICD-10-CM | POA: Diagnosis not present

## 2018-10-17 DIAGNOSIS — D631 Anemia in chronic kidney disease: Secondary | ICD-10-CM | POA: Diagnosis not present

## 2018-10-20 DIAGNOSIS — N186 End stage renal disease: Secondary | ICD-10-CM | POA: Diagnosis not present

## 2018-10-20 DIAGNOSIS — D509 Iron deficiency anemia, unspecified: Secondary | ICD-10-CM | POA: Diagnosis not present

## 2018-10-20 DIAGNOSIS — N2581 Secondary hyperparathyroidism of renal origin: Secondary | ICD-10-CM | POA: Diagnosis not present

## 2018-10-20 DIAGNOSIS — D631 Anemia in chronic kidney disease: Secondary | ICD-10-CM | POA: Diagnosis not present

## 2018-10-20 DIAGNOSIS — E1129 Type 2 diabetes mellitus with other diabetic kidney complication: Secondary | ICD-10-CM | POA: Diagnosis not present

## 2018-10-22 DIAGNOSIS — N186 End stage renal disease: Secondary | ICD-10-CM | POA: Diagnosis not present

## 2018-10-22 DIAGNOSIS — D631 Anemia in chronic kidney disease: Secondary | ICD-10-CM | POA: Diagnosis not present

## 2018-10-22 DIAGNOSIS — D509 Iron deficiency anemia, unspecified: Secondary | ICD-10-CM | POA: Diagnosis not present

## 2018-10-22 DIAGNOSIS — E1129 Type 2 diabetes mellitus with other diabetic kidney complication: Secondary | ICD-10-CM | POA: Diagnosis not present

## 2018-10-22 DIAGNOSIS — N2581 Secondary hyperparathyroidism of renal origin: Secondary | ICD-10-CM | POA: Diagnosis not present

## 2018-10-23 ENCOUNTER — Ambulatory Visit: Payer: Medicare Other | Admitting: Licensed Clinical Social Worker

## 2018-10-23 ENCOUNTER — Ambulatory Visit (HOSPITAL_COMMUNITY): Payer: Self-pay | Admitting: Psychiatry

## 2018-10-23 DIAGNOSIS — F411 Generalized anxiety disorder: Secondary | ICD-10-CM | POA: Diagnosis not present

## 2018-10-23 DIAGNOSIS — F323 Major depressive disorder, single episode, severe with psychotic features: Secondary | ICD-10-CM | POA: Diagnosis not present

## 2018-10-24 DIAGNOSIS — D631 Anemia in chronic kidney disease: Secondary | ICD-10-CM | POA: Diagnosis not present

## 2018-10-24 DIAGNOSIS — E1129 Type 2 diabetes mellitus with other diabetic kidney complication: Secondary | ICD-10-CM | POA: Diagnosis not present

## 2018-10-24 DIAGNOSIS — D509 Iron deficiency anemia, unspecified: Secondary | ICD-10-CM | POA: Diagnosis not present

## 2018-10-24 DIAGNOSIS — N2581 Secondary hyperparathyroidism of renal origin: Secondary | ICD-10-CM | POA: Diagnosis not present

## 2018-10-24 DIAGNOSIS — N186 End stage renal disease: Secondary | ICD-10-CM | POA: Diagnosis not present

## 2018-10-27 DIAGNOSIS — D631 Anemia in chronic kidney disease: Secondary | ICD-10-CM | POA: Diagnosis not present

## 2018-10-27 DIAGNOSIS — D509 Iron deficiency anemia, unspecified: Secondary | ICD-10-CM | POA: Diagnosis not present

## 2018-10-27 DIAGNOSIS — N2581 Secondary hyperparathyroidism of renal origin: Secondary | ICD-10-CM | POA: Diagnosis not present

## 2018-10-27 DIAGNOSIS — E1129 Type 2 diabetes mellitus with other diabetic kidney complication: Secondary | ICD-10-CM | POA: Diagnosis not present

## 2018-10-27 DIAGNOSIS — N186 End stage renal disease: Secondary | ICD-10-CM | POA: Diagnosis not present

## 2018-10-28 ENCOUNTER — Telehealth: Payer: Self-pay | Admitting: Licensed Clinical Social Worker

## 2018-10-28 NOTE — Telephone Encounter (Signed)
Patient was contacted to follow up on a referral. Patient reported that he went to his appointment on 10/23/18 with [facility], and has a follow up appointment in four weeks.

## 2018-10-28 NOTE — Addendum Note (Signed)
Addended by: Hulan Fray on: 10/28/2018 10:37 AM   Modules accepted: Orders

## 2018-10-29 DIAGNOSIS — D509 Iron deficiency anemia, unspecified: Secondary | ICD-10-CM | POA: Diagnosis not present

## 2018-10-29 DIAGNOSIS — M545 Low back pain: Secondary | ICD-10-CM | POA: Diagnosis not present

## 2018-10-29 DIAGNOSIS — M7918 Myalgia, other site: Secondary | ICD-10-CM | POA: Diagnosis not present

## 2018-10-29 DIAGNOSIS — M546 Pain in thoracic spine: Secondary | ICD-10-CM | POA: Diagnosis not present

## 2018-10-29 DIAGNOSIS — N186 End stage renal disease: Secondary | ICD-10-CM | POA: Diagnosis not present

## 2018-10-29 DIAGNOSIS — N2581 Secondary hyperparathyroidism of renal origin: Secondary | ICD-10-CM | POA: Diagnosis not present

## 2018-10-29 DIAGNOSIS — E1129 Type 2 diabetes mellitus with other diabetic kidney complication: Secondary | ICD-10-CM | POA: Diagnosis not present

## 2018-10-29 DIAGNOSIS — D631 Anemia in chronic kidney disease: Secondary | ICD-10-CM | POA: Diagnosis not present

## 2018-10-31 DIAGNOSIS — D631 Anemia in chronic kidney disease: Secondary | ICD-10-CM | POA: Diagnosis not present

## 2018-10-31 DIAGNOSIS — N186 End stage renal disease: Secondary | ICD-10-CM | POA: Diagnosis not present

## 2018-10-31 DIAGNOSIS — D509 Iron deficiency anemia, unspecified: Secondary | ICD-10-CM | POA: Diagnosis not present

## 2018-10-31 DIAGNOSIS — N2581 Secondary hyperparathyroidism of renal origin: Secondary | ICD-10-CM | POA: Diagnosis not present

## 2018-10-31 DIAGNOSIS — E1129 Type 2 diabetes mellitus with other diabetic kidney complication: Secondary | ICD-10-CM | POA: Diagnosis not present

## 2018-11-03 ENCOUNTER — Ambulatory Visit (INDEPENDENT_AMBULATORY_CARE_PROVIDER_SITE_OTHER): Payer: Medicare Other | Admitting: Internal Medicine

## 2018-11-03 ENCOUNTER — Encounter: Payer: Self-pay | Admitting: Internal Medicine

## 2018-11-03 ENCOUNTER — Other Ambulatory Visit: Payer: Self-pay

## 2018-11-03 VITALS — BP 156/89 | HR 101 | Temp 98.9°F | Ht 72.0 in | Wt 204.8 lb

## 2018-11-03 DIAGNOSIS — G8929 Other chronic pain: Secondary | ICD-10-CM

## 2018-11-03 DIAGNOSIS — Z952 Presence of prosthetic heart valve: Secondary | ICD-10-CM | POA: Diagnosis not present

## 2018-11-03 DIAGNOSIS — L97511 Non-pressure chronic ulcer of other part of right foot limited to breakdown of skin: Secondary | ICD-10-CM

## 2018-11-03 DIAGNOSIS — I5030 Unspecified diastolic (congestive) heart failure: Secondary | ICD-10-CM

## 2018-11-03 DIAGNOSIS — M4604 Spinal enthesopathy, thoracic region: Secondary | ICD-10-CM

## 2018-11-03 DIAGNOSIS — Z992 Dependence on renal dialysis: Secondary | ICD-10-CM

## 2018-11-03 DIAGNOSIS — Z7901 Long term (current) use of anticoagulants: Secondary | ICD-10-CM | POA: Diagnosis not present

## 2018-11-03 DIAGNOSIS — Z7982 Long term (current) use of aspirin: Secondary | ICD-10-CM | POA: Diagnosis not present

## 2018-11-03 DIAGNOSIS — Z951 Presence of aortocoronary bypass graft: Secondary | ICD-10-CM | POA: Diagnosis not present

## 2018-11-03 DIAGNOSIS — M546 Pain in thoracic spine: Secondary | ICD-10-CM | POA: Diagnosis not present

## 2018-11-03 DIAGNOSIS — I2510 Atherosclerotic heart disease of native coronary artery without angina pectoris: Secondary | ICD-10-CM | POA: Diagnosis not present

## 2018-11-03 DIAGNOSIS — N2581 Secondary hyperparathyroidism of renal origin: Secondary | ICD-10-CM | POA: Diagnosis not present

## 2018-11-03 DIAGNOSIS — D631 Anemia in chronic kidney disease: Secondary | ICD-10-CM | POA: Diagnosis not present

## 2018-11-03 DIAGNOSIS — Z87891 Personal history of nicotine dependence: Secondary | ICD-10-CM | POA: Diagnosis not present

## 2018-11-03 DIAGNOSIS — D1809 Hemangioma of other sites: Secondary | ICD-10-CM

## 2018-11-03 DIAGNOSIS — N186 End stage renal disease: Secondary | ICD-10-CM | POA: Diagnosis not present

## 2018-11-03 DIAGNOSIS — Z8673 Personal history of transient ischemic attack (TIA), and cerebral infarction without residual deficits: Secondary | ICD-10-CM | POA: Diagnosis not present

## 2018-11-03 DIAGNOSIS — E11621 Type 2 diabetes mellitus with foot ulcer: Secondary | ICD-10-CM | POA: Diagnosis not present

## 2018-11-03 DIAGNOSIS — E1122 Type 2 diabetes mellitus with diabetic chronic kidney disease: Secondary | ICD-10-CM

## 2018-11-03 DIAGNOSIS — E1129 Type 2 diabetes mellitus with other diabetic kidney complication: Secondary | ICD-10-CM | POA: Diagnosis not present

## 2018-11-03 DIAGNOSIS — L84 Corns and callosities: Secondary | ICD-10-CM

## 2018-11-03 DIAGNOSIS — M549 Dorsalgia, unspecified: Secondary | ICD-10-CM | POA: Diagnosis not present

## 2018-11-03 DIAGNOSIS — D509 Iron deficiency anemia, unspecified: Secondary | ICD-10-CM | POA: Diagnosis not present

## 2018-11-03 DIAGNOSIS — J90 Pleural effusion, not elsewhere classified: Secondary | ICD-10-CM

## 2018-11-03 MED ORDER — DICLOFENAC SODIUM 1 % TD GEL: 2.0000 g | Freq: Four times a day (QID) | TRANSDERMAL | 1 refills | Status: AC

## 2018-11-03 MED ORDER — PROMETHAZINE HCL 25 MG/ML IJ SOLN
6.2500 mg | Freq: Once | INTRAMUSCULAR | Status: AC
  Administered 2018-11-03: 6.25 mg via INTRAMUSCULAR

## 2018-11-03 MED ORDER — LIDOCAINE 5 % EX OINT: 1.0000 "application " | TOPICAL_OINTMENT | CUTANEOUS | 0 refills | Status: AC | PRN

## 2018-11-03 NOTE — Assessment & Plan Note (Signed)
Patient with what appears to be a healing ulcer based on prior photos from ED visit in Dec 2019. He had completed 10d of doxycycline and has been following with wound care. On exam he still has significant callous on the toe which needs to be debrided, but no signs of active infection.  Plan: --continue wound care

## 2018-11-03 NOTE — Patient Instructions (Addendum)
Keep going to wound care for your toe.  For your back, we will work to get you into physical therapy. If you don't hear back by the end of the week for an appointment, please call us.  I have sent in voltaren gel and lidocaine creme to help with the pain as well.   We'll see you back in 1 month to assess your pain. If it changes before then, let us know and we'll be happy to see you sooner.

## 2018-11-03 NOTE — Assessment & Plan Note (Signed)
Patient with exam consistent with persistent left pleural effusion. Denies symptoms today. He has not followed up with his cardiothoracic surgeon at [facility] stating he has not been able to successfully contact them.  He had a therapeutic thoracentesis back in Oct 2019 that was arranged through ED, however no fluid studies were performed at the time  Plan: --advised patient continue reaching out for appointment --will ask our staff if they can assist making this appointment for him

## 2018-11-03 NOTE — Assessment & Plan Note (Addendum)
Patient with h/o ESRD on HD, MVR, and now diabetic foot ulcer with chronic thoracic back pain that appears progressive. He is at high risk for osteomyelitis and with the unresolving nature of this less common thoracic pain, we will evaluate further with MRI.   Plan: --urgent MRI thoracic spine to evaluate for osteomyelitis --CBC w/ diff, ESR, CRP - patient left w/o getting labs; RN to contact for patient to return for labs --PT referal (had not received call for previous referral) --topical voltaren and lidocaine gel  Addendum: CBC w/o leukocytosis, ESR wnl, CRP mildly elevated which could be from his diabetic foot infection - following up with wound care MRI thoracic spine w/o acute findings to explain pain including osteomyelitis, myositis. 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 This may be causing his pain, however his description of the pain appears more muscular in nature. I discussed his findings of hemangioma with Dr. Ronnald Ramp with [facility] who states based on appearance and no other involvement the hemangioma is likely not symptomatic. He noted T6-7 spurring which may be causing pain but recommends PT, injections and dry needling - no surgical indication at this time. Pt has already been referred to PT and is following with pain management for injections. And unsurprisingly, MRI revealed a large left sided pleural effusion for which I have urged he f/u with his CT surgeon.

## 2018-11-03 NOTE — Progress Notes (Signed)
CC: thoracic pain  HPI:  [name] is a 63 y.o. with a PMH of T2DM, ESRD on HD, h/o TIA, dCHF, CAD s/p CABG, s/p MVR with bioprosthesis presenting to clinic for ongoing thoracic back pain and diabetic foot ulcer.  Patient with several monthss long h/o thoracic back pain which he reports has been progressively worse. It was initially more on the right and now has spread to bil upper back about 2 months ago and remained stable since then. He reports no improvement in pain with previous therapies including tramadol, flexeril, tylenol. Per chart review there are intermittent reports that these do help at times but patient states otherwise today. He has had an ED visit for encephalopathy earlier this year thought to have been due to oversedation with lyrica and flexeril.  Patient endorses a mild cough without hemoptysis, denies fevers, chills, change in shortness of breath; denies CP. Weight has been overall stable within 15lbs due to previous frequent admissions for volume overload.   He has intermittent N/V mainly associated with HD which he states is very rough for him. He had an acute episode of nausea today in clinic with dry heaving which he states is typical for him; he had just finished HD session prior to appointment. He had been able to tolerate PO intake earlier in the day.   Please see problem based Assessment and Plan for status of patients chronic conditions.  Past Medical History:  Diagnosis Date  . Anemia   . Anginal pain ([place])   . CAD (coronary artery disease)    a. per [facility] s/p 3.36mm x 108mm Vision BMS to mid LAD 12/2009 and Xience DES to mid LAD 10/2010.  [name] Chronic diastolic CHF (congestive heart failure) ([place])   . Colon polyps   . Daily headache   . ESRD on dialysis [facility]) since ~ 2008   "MWF; [facility] Rinks" (03/04/2017)  . GERD (gastroesophageal reflux disease)   . H/O TIA (transient ischemic attack) 04/01/2015  . H/O TIA (transient ischemic attack) 04/01/2015    . Heart murmur   . Hematochezia    a. 2014: colonscopy, which showed moderately-sized internal hemorrhoids, two 45mm polyps in transverse colon and ascending colon that were resected, five 2-45mm polyps in sigmoid colon, descending colon, transverse colon, and ascending colon that were resected. An upper endoscopy was performed and showed normal esophagus, stomach, and duodenum.  . Hematuria    a. H/o hematuria 2014 with cystoscopy that was unrevealing for his source of hematuria. He underwent a kidney ultrasound on 10/14 that showed mildly echogenic and scarred kidneys compatible with medical renal disease, without hydronephrosis or renal calculi.  [name] History of blood transfusion    "had colonoscopy done; they had to give me some blood"  . History of kidney stones   . Hyperlipidemia   . Hypertension   . On home oxygen therapy    "2L prn" (07/21/2015); "been off it for awhile" (03/04/2017)  . Pneumonia   . Renal insufficiency   . 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 Type II diabetes mellitus ([place])   . Wears dentures     Review of Systems:   Per HPI  Physical Exam:  Vitals:   11/03/18 1317  BP: (!) 156/89  Pulse: (!) 101  Temp: 98.9 F (37.2 C)  TempSrc: Oral  SpO2: 100%  Weight: 204 lb 12.8 oz (92.9 kg)  Height: 6' (1.829 m)   GENERAL- alert, co-operative,  appears as stated age, not in any distress. CARDIAC- RRR RESP- decreased breath sounds over left mid and lower lung fields; right lung fields clear w/o crackles ABDOMEN- Soft, nontender, bowel sounds present. EXTREMITIES- pulse 2+ PT/DP right foot; no LE edema. Large callous w/o active wound/drainage/erythema/induration on right great toe MSK- TTP of thoracic paraspinal mms; strength and sensation intact throughout.  Assessment & Plan:   See Encounters Tab for problem based charting.   Patient discussed with Dr. Nilsa Nutting, MD Internal Medicine PGY-3

## 2018-11-04 ENCOUNTER — Ambulatory Visit (HOSPITAL_COMMUNITY)
Admission: RE | Admit: 2018-11-04 | Discharge: 2018-11-04 | Disposition: A | Discharge status: Home / Self Care | Payer: Medicare Other | Source: Ambulatory Visit | Attending: Internal Medicine | Admitting: Internal Medicine

## 2018-11-04 ENCOUNTER — Telehealth: Payer: Self-pay | Admitting: *Deleted

## 2018-11-04 DIAGNOSIS — M5124 Other intervertebral disc displacement, thoracic region: Secondary | ICD-10-CM | POA: Diagnosis not present

## 2018-11-04 DIAGNOSIS — M549 Dorsalgia, unspecified: Secondary | ICD-10-CM | POA: Insufficient documentation

## 2018-11-04 DIAGNOSIS — G8929 Other chronic pain: Secondary | ICD-10-CM

## 2018-11-04 LAB — CBC WITH DIFFERENTIAL/PLATELET
Abs Immature Granulocytes: 0.02 10*3/uL (ref 0.00–0.07)
Basophils Absolute: 0 10*3/uL (ref 0.0–0.1)
Basophils Relative: 1 %
Eosinophils Absolute: 0.2 10*3/uL (ref 0.0–0.5)
Eosinophils Relative: 2 %
HCT: 36.8 % — ABNORMAL LOW (ref 39.0–52.0)
Hemoglobin: 11.1 g/dL — ABNORMAL LOW (ref 13.0–17.0)
Immature Granulocytes: 0 %
Lymphocytes Relative: 13 %
Lymphs Abs: 1 10*3/uL (ref 0.7–4.0)
MCH: 24 pg — ABNORMAL LOW (ref 26.0–34.0)
MCHC: 30.2 g/dL (ref 30.0–36.0)
MCV: 79.7 fL — ABNORMAL LOW (ref 80.0–100.0)
Monocytes Absolute: 0.9 10*3/uL (ref 0.1–1.0)
Monocytes Relative: 12 %
Neutro Abs: 5.3 10*3/uL (ref 1.7–7.7)
Neutrophils Relative %: 72 %
Platelets: 162 10*3/uL (ref 150–400)
RBC: 4.62 MIL/uL (ref 4.22–5.81)
RDW: 19.1 % — ABNORMAL HIGH (ref 11.5–15.5)
WBC: 7.4 10*3/uL (ref 4.0–10.5)
nRBC: 0 % (ref 0.0–0.2)

## 2018-11-04 LAB — C-REACTIVE PROTEIN: CRP: 0.8 mg/dL (ref <1.0)

## 2018-11-04 LAB — SEDIMENTATION RATE: Sed Rate: 25 mm/hr — ABNORMAL HIGH (ref 0–16)

## 2018-11-04 NOTE — Progress Notes (Signed)
Internal Medicine Clinic Attending  Case discussed with Dr. Svalina  at the time of the visit.  We reviewed the resident's history and exam and pertinent patient test results.  I agree with the assessment, diagnosis, and plan of care documented in the resident's note.  

## 2018-11-04 NOTE — Telephone Encounter (Addendum)
Information was sent through CoverMyMeds for [place] for Lidocaine 5 % Ointment.  Awaiting determination within 3 days.  Sander Nephew, RN 11/04/2018  2:47 PM. Fax from [facility] for Lidocaine 5% ointment is approved 10/08/2018 thru 02/02/2019.  Sander Nephew, RN 11/05/2018 10:06 AM.

## 2018-11-04 NOTE — Addendum Note (Signed)
Addended by: Alphonzo Grieve on: 11/04/2018 02:07 PM   Modules accepted: Orders

## 2018-11-04 NOTE — Addendum Note (Signed)
Addended by: Truddie Crumble on: 11/04/2018 05:02 PM   Modules accepted: Orders

## 2018-11-05 ENCOUNTER — Telehealth: Payer: Self-pay | Admitting: *Deleted

## 2018-11-05 DIAGNOSIS — D509 Iron deficiency anemia, unspecified: Secondary | ICD-10-CM | POA: Diagnosis not present

## 2018-11-05 DIAGNOSIS — E1129 Type 2 diabetes mellitus with other diabetic kidney complication: Secondary | ICD-10-CM | POA: Diagnosis not present

## 2018-11-05 DIAGNOSIS — N2581 Secondary hyperparathyroidism of renal origin: Secondary | ICD-10-CM | POA: Diagnosis not present

## 2018-11-05 DIAGNOSIS — N186 End stage renal disease: Secondary | ICD-10-CM | POA: Diagnosis not present

## 2018-11-05 DIAGNOSIS — D631 Anemia in chronic kidney disease: Secondary | ICD-10-CM | POA: Diagnosis not present

## 2018-11-05 NOTE — Telephone Encounter (Signed)
Thank you :)

## 2018-11-05 NOTE — Telephone Encounter (Signed)
Pt called concerned about PT referral and possibly getting crutches. He was reassured that his PT referral was in process and PT would assist in helping with proper assistive devices. He is agreeable

## 2018-11-06 ENCOUNTER — Telehealth: Payer: Self-pay

## 2018-11-06 DIAGNOSIS — L97512 Non-pressure chronic ulcer of other part of right foot with fat layer exposed: Secondary | ICD-10-CM | POA: Diagnosis not present

## 2018-11-06 DIAGNOSIS — N186 End stage renal disease: Secondary | ICD-10-CM | POA: Diagnosis not present

## 2018-11-06 DIAGNOSIS — E11621 Type 2 diabetes mellitus with foot ulcer: Secondary | ICD-10-CM | POA: Diagnosis not present

## 2018-11-06 DIAGNOSIS — E1151 Type 2 diabetes mellitus with diabetic peripheral angiopathy without gangrene: Secondary | ICD-10-CM | POA: Diagnosis not present

## 2018-11-06 DIAGNOSIS — I120 Hypertensive chronic kidney disease with stage 5 chronic kidney disease or end stage renal disease: Secondary | ICD-10-CM | POA: Diagnosis not present

## 2018-11-06 DIAGNOSIS — E1122 Type 2 diabetes mellitus with diabetic chronic kidney disease: Secondary | ICD-10-CM | POA: Diagnosis not present

## 2018-11-06 MED ORDER — TRAMADOL HCL 50 MG PO TABS: 50.0000 mg | ORAL_TABLET | Freq: Two times a day (BID) | ORAL | 0 refills | Status: AC | PRN

## 2018-11-06 NOTE — Telephone Encounter (Signed)
Requesting med for pain, please call pt back.

## 2018-11-06 NOTE — Telephone Encounter (Signed)
Called patient back. He states this morning he had debridement of his diabetic foot would and callous and is having lots of pain with it. He states wound care was not able to provide pain medicine for him.   Due to ESRD he is not candidate for NSAIDs. He has had prior oversedation so will have to be careful - currently he is on a lower dose of lyrica and is no longer on muscle relaxants.   Will send in for 5 days of tramadol 50mg  BID PRN and to also take tylenol. Advised that if pain persists he will have to be seen for further refills. PDMP reviewed and no more recent controlled medications sent. Patient stated understanding and had no further questions.  Alphonzo Grieve

## 2018-11-07 DIAGNOSIS — E1129 Type 2 diabetes mellitus with other diabetic kidney complication: Secondary | ICD-10-CM | POA: Diagnosis not present

## 2018-11-07 DIAGNOSIS — D509 Iron deficiency anemia, unspecified: Secondary | ICD-10-CM | POA: Diagnosis not present

## 2018-11-07 DIAGNOSIS — N186 End stage renal disease: Secondary | ICD-10-CM | POA: Diagnosis not present

## 2018-11-07 DIAGNOSIS — N2581 Secondary hyperparathyroidism of renal origin: Secondary | ICD-10-CM | POA: Diagnosis not present

## 2018-11-07 DIAGNOSIS — D631 Anemia in chronic kidney disease: Secondary | ICD-10-CM | POA: Diagnosis not present

## 2018-11-08 DIAGNOSIS — Z992 Dependence on renal dialysis: Secondary | ICD-10-CM | POA: Diagnosis not present

## 2018-11-08 DIAGNOSIS — N186 End stage renal disease: Secondary | ICD-10-CM | POA: Diagnosis not present

## 2018-11-08 DIAGNOSIS — E1122 Type 2 diabetes mellitus with diabetic chronic kidney disease: Secondary | ICD-10-CM | POA: Diagnosis not present

## 2018-11-10 DIAGNOSIS — D509 Iron deficiency anemia, unspecified: Secondary | ICD-10-CM | POA: Diagnosis not present

## 2018-11-10 DIAGNOSIS — N2581 Secondary hyperparathyroidism of renal origin: Secondary | ICD-10-CM | POA: Diagnosis not present

## 2018-11-10 DIAGNOSIS — N186 End stage renal disease: Secondary | ICD-10-CM | POA: Diagnosis not present

## 2018-11-10 DIAGNOSIS — D631 Anemia in chronic kidney disease: Secondary | ICD-10-CM | POA: Diagnosis not present

## 2018-11-10 DIAGNOSIS — E1129 Type 2 diabetes mellitus with other diabetic kidney complication: Secondary | ICD-10-CM | POA: Diagnosis not present

## 2018-11-12 DIAGNOSIS — N186 End stage renal disease: Secondary | ICD-10-CM | POA: Diagnosis not present

## 2018-11-12 DIAGNOSIS — D631 Anemia in chronic kidney disease: Secondary | ICD-10-CM | POA: Diagnosis not present

## 2018-11-12 DIAGNOSIS — N2581 Secondary hyperparathyroidism of renal origin: Secondary | ICD-10-CM | POA: Diagnosis not present

## 2018-11-12 DIAGNOSIS — E1129 Type 2 diabetes mellitus with other diabetic kidney complication: Secondary | ICD-10-CM | POA: Diagnosis not present

## 2018-11-12 DIAGNOSIS — D509 Iron deficiency anemia, unspecified: Secondary | ICD-10-CM | POA: Diagnosis not present

## 2018-11-13 ENCOUNTER — Ambulatory Visit (HOSPITAL_BASED_OUTPATIENT_CLINIC_OR_DEPARTMENT_OTHER): Payer: Self-pay

## 2018-11-14 DIAGNOSIS — N2581 Secondary hyperparathyroidism of renal origin: Secondary | ICD-10-CM | POA: Diagnosis not present

## 2018-11-14 DIAGNOSIS — D631 Anemia in chronic kidney disease: Secondary | ICD-10-CM | POA: Diagnosis not present

## 2018-11-14 DIAGNOSIS — N186 End stage renal disease: Secondary | ICD-10-CM | POA: Diagnosis not present

## 2018-11-14 DIAGNOSIS — E1129 Type 2 diabetes mellitus with other diabetic kidney complication: Secondary | ICD-10-CM | POA: Diagnosis not present

## 2018-11-14 DIAGNOSIS — D509 Iron deficiency anemia, unspecified: Secondary | ICD-10-CM | POA: Diagnosis not present

## 2018-11-15 ENCOUNTER — Other Ambulatory Visit: Payer: Self-pay | Admitting: Internal Medicine

## 2018-11-17 DIAGNOSIS — N186 End stage renal disease: Secondary | ICD-10-CM | POA: Diagnosis not present

## 2018-11-17 DIAGNOSIS — D631 Anemia in chronic kidney disease: Secondary | ICD-10-CM | POA: Diagnosis not present

## 2018-11-17 DIAGNOSIS — E1129 Type 2 diabetes mellitus with other diabetic kidney complication: Secondary | ICD-10-CM | POA: Diagnosis not present

## 2018-11-17 DIAGNOSIS — N2581 Secondary hyperparathyroidism of renal origin: Secondary | ICD-10-CM | POA: Diagnosis not present

## 2018-11-17 DIAGNOSIS — D509 Iron deficiency anemia, unspecified: Secondary | ICD-10-CM | POA: Diagnosis not present

## 2018-11-17 NOTE — Telephone Encounter (Signed)
Tramadol refill denied (see 11/06/18 telephone encounter) .Despina Hidden Cassady2/10/20208:54 AM

## 2018-11-18 ENCOUNTER — Telehealth: Payer: Self-pay | Admitting: *Deleted

## 2018-11-18 ENCOUNTER — Ambulatory Visit: Payer: Medicare Other | Attending: Student in an Organized Health Care Education/Training Program

## 2018-11-18 DIAGNOSIS — F411 Generalized anxiety disorder: Secondary | ICD-10-CM | POA: Diagnosis not present

## 2018-11-18 DIAGNOSIS — F323 Major depressive disorder, single episode, severe with psychotic features: Secondary | ICD-10-CM | POA: Diagnosis not present

## 2018-11-18 NOTE — Telephone Encounter (Signed)
Please call pt and explain MRI findings, he is very frustrated [phone]

## 2018-11-18 NOTE — Addendum Note (Signed)
Addended by: Hulan Fray on: 11/18/2018 08:37 PM   Modules accepted: Orders

## 2018-11-19 ENCOUNTER — Telehealth: Payer: Self-pay

## 2018-11-19 DIAGNOSIS — E1129 Type 2 diabetes mellitus with other diabetic kidney complication: Secondary | ICD-10-CM | POA: Diagnosis not present

## 2018-11-19 DIAGNOSIS — D631 Anemia in chronic kidney disease: Secondary | ICD-10-CM | POA: Diagnosis not present

## 2018-11-19 DIAGNOSIS — D509 Iron deficiency anemia, unspecified: Secondary | ICD-10-CM | POA: Diagnosis not present

## 2018-11-19 DIAGNOSIS — N186 End stage renal disease: Secondary | ICD-10-CM | POA: Diagnosis not present

## 2018-11-19 DIAGNOSIS — N2581 Secondary hyperparathyroidism of renal origin: Secondary | ICD-10-CM | POA: Diagnosis not present

## 2018-11-19 NOTE — Telephone Encounter (Signed)
Needs to speak with a nurse about back pain. Please call pt back.

## 2018-11-20 DIAGNOSIS — Z992 Dependence on renal dialysis: Secondary | ICD-10-CM | POA: Diagnosis not present

## 2018-11-20 DIAGNOSIS — N186 End stage renal disease: Secondary | ICD-10-CM | POA: Diagnosis not present

## 2018-11-20 DIAGNOSIS — I871 Compression of vein: Secondary | ICD-10-CM | POA: Diagnosis not present

## 2018-11-20 NOTE — Telephone Encounter (Signed)
Pt calls and states his back is getting worse and he has not heard from his doctor about his results, he has appt at [facility] vascular today due to problems with his dialysis cath site. He will call back this.  dr Trilby Drummer please  give him a call.

## 2018-11-21 DIAGNOSIS — E1129 Type 2 diabetes mellitus with other diabetic kidney complication: Secondary | ICD-10-CM | POA: Diagnosis not present

## 2018-11-21 DIAGNOSIS — D631 Anemia in chronic kidney disease: Secondary | ICD-10-CM | POA: Diagnosis not present

## 2018-11-21 DIAGNOSIS — N2581 Secondary hyperparathyroidism of renal origin: Secondary | ICD-10-CM | POA: Diagnosis not present

## 2018-11-21 DIAGNOSIS — D509 Iron deficiency anemia, unspecified: Secondary | ICD-10-CM | POA: Diagnosis not present

## 2018-11-21 DIAGNOSIS — N186 End stage renal disease: Secondary | ICD-10-CM | POA: Diagnosis not present

## 2018-11-21 NOTE — Telephone Encounter (Signed)
Dr Trilby Drummer spoke to pt

## 2018-11-21 NOTE — Telephone Encounter (Signed)
Patient contacted about his recent MRI results. He thinks someone may have called him before about it but he does not remember. He was informed that the MRI was negative for infection, or significant changes to explain his pain. The MRI did note small disc protrusions and foraminal narrowing at a couple of level, but none significant. He was informed that Dr. Jari Favre had spoken with a neurosurgeon about the hemangioma findings, who did not believe this to be the source of the patient's pain. Patient has been reminded to continue with PT, Pain management (for Injections), and dry needling. He was also reminded to follow up with his CT surgeon for persistent left Pleural effusion.  [name] Grippe, DO IM PGY-2

## 2018-11-24 DIAGNOSIS — D509 Iron deficiency anemia, unspecified: Secondary | ICD-10-CM | POA: Diagnosis not present

## 2018-11-24 DIAGNOSIS — E1129 Type 2 diabetes mellitus with other diabetic kidney complication: Secondary | ICD-10-CM | POA: Diagnosis not present

## 2018-11-24 DIAGNOSIS — N186 End stage renal disease: Secondary | ICD-10-CM | POA: Diagnosis not present

## 2018-11-24 DIAGNOSIS — N2581 Secondary hyperparathyroidism of renal origin: Secondary | ICD-10-CM | POA: Diagnosis not present

## 2018-11-24 DIAGNOSIS — D631 Anemia in chronic kidney disease: Secondary | ICD-10-CM | POA: Diagnosis not present

## 2018-11-26 DIAGNOSIS — N186 End stage renal disease: Secondary | ICD-10-CM | POA: Diagnosis not present

## 2018-11-26 DIAGNOSIS — E1129 Type 2 diabetes mellitus with other diabetic kidney complication: Secondary | ICD-10-CM | POA: Diagnosis not present

## 2018-11-26 DIAGNOSIS — D509 Iron deficiency anemia, unspecified: Secondary | ICD-10-CM | POA: Diagnosis not present

## 2018-11-26 DIAGNOSIS — D631 Anemia in chronic kidney disease: Secondary | ICD-10-CM | POA: Diagnosis not present

## 2018-11-26 DIAGNOSIS — N2581 Secondary hyperparathyroidism of renal origin: Secondary | ICD-10-CM | POA: Diagnosis not present

## 2018-11-28 DIAGNOSIS — E1129 Type 2 diabetes mellitus with other diabetic kidney complication: Secondary | ICD-10-CM | POA: Diagnosis not present

## 2018-11-28 DIAGNOSIS — D509 Iron deficiency anemia, unspecified: Secondary | ICD-10-CM | POA: Diagnosis not present

## 2018-11-28 DIAGNOSIS — N2581 Secondary hyperparathyroidism of renal origin: Secondary | ICD-10-CM | POA: Diagnosis not present

## 2018-11-28 DIAGNOSIS — N186 End stage renal disease: Secondary | ICD-10-CM | POA: Diagnosis not present

## 2018-11-28 DIAGNOSIS — D631 Anemia in chronic kidney disease: Secondary | ICD-10-CM | POA: Diagnosis not present

## 2018-12-01 DIAGNOSIS — E1129 Type 2 diabetes mellitus with other diabetic kidney complication: Secondary | ICD-10-CM | POA: Diagnosis not present

## 2018-12-01 DIAGNOSIS — N2581 Secondary hyperparathyroidism of renal origin: Secondary | ICD-10-CM | POA: Diagnosis not present

## 2018-12-01 DIAGNOSIS — N186 End stage renal disease: Secondary | ICD-10-CM | POA: Diagnosis not present

## 2018-12-01 DIAGNOSIS — D509 Iron deficiency anemia, unspecified: Secondary | ICD-10-CM | POA: Diagnosis not present

## 2018-12-01 DIAGNOSIS — D631 Anemia in chronic kidney disease: Secondary | ICD-10-CM | POA: Diagnosis not present

## 2018-12-03 ENCOUNTER — Encounter: Payer: Self-pay | Admitting: Internal Medicine

## 2018-12-03 DIAGNOSIS — N186 End stage renal disease: Secondary | ICD-10-CM | POA: Diagnosis not present

## 2018-12-03 DIAGNOSIS — D631 Anemia in chronic kidney disease: Secondary | ICD-10-CM | POA: Diagnosis not present

## 2018-12-03 DIAGNOSIS — E1129 Type 2 diabetes mellitus with other diabetic kidney complication: Secondary | ICD-10-CM | POA: Diagnosis not present

## 2018-12-03 DIAGNOSIS — N2581 Secondary hyperparathyroidism of renal origin: Secondary | ICD-10-CM | POA: Diagnosis not present

## 2018-12-03 DIAGNOSIS — D509 Iron deficiency anemia, unspecified: Secondary | ICD-10-CM | POA: Diagnosis not present

## 2018-12-04 ENCOUNTER — Encounter: Payer: Self-pay | Admitting: Internal Medicine

## 2018-12-04 ENCOUNTER — Other Ambulatory Visit: Payer: Self-pay | Admitting: *Deleted

## 2018-12-04 DIAGNOSIS — G8929 Other chronic pain: Secondary | ICD-10-CM

## 2018-12-04 DIAGNOSIS — M549 Dorsalgia, unspecified: Principal | ICD-10-CM

## 2018-12-05 DIAGNOSIS — D631 Anemia in chronic kidney disease: Secondary | ICD-10-CM | POA: Diagnosis not present

## 2018-12-05 DIAGNOSIS — N2581 Secondary hyperparathyroidism of renal origin: Secondary | ICD-10-CM | POA: Diagnosis not present

## 2018-12-05 DIAGNOSIS — D509 Iron deficiency anemia, unspecified: Secondary | ICD-10-CM | POA: Diagnosis not present

## 2018-12-05 DIAGNOSIS — N186 End stage renal disease: Secondary | ICD-10-CM | POA: Diagnosis not present

## 2018-12-05 DIAGNOSIS — E1129 Type 2 diabetes mellitus with other diabetic kidney complication: Secondary | ICD-10-CM | POA: Diagnosis not present

## 2018-12-08 DIAGNOSIS — N186 End stage renal disease: Secondary | ICD-10-CM | POA: Diagnosis not present

## 2018-12-08 DIAGNOSIS — N2581 Secondary hyperparathyroidism of renal origin: Secondary | ICD-10-CM | POA: Diagnosis not present

## 2018-12-08 DIAGNOSIS — D631 Anemia in chronic kidney disease: Secondary | ICD-10-CM | POA: Diagnosis not present

## 2018-12-09 ENCOUNTER — Other Ambulatory Visit: Payer: Self-pay | Admitting: Internal Medicine

## 2018-12-10 DIAGNOSIS — N186 End stage renal disease: Secondary | ICD-10-CM | POA: Diagnosis not present

## 2018-12-10 DIAGNOSIS — N2581 Secondary hyperparathyroidism of renal origin: Secondary | ICD-10-CM | POA: Diagnosis not present

## 2018-12-10 DIAGNOSIS — D631 Anemia in chronic kidney disease: Secondary | ICD-10-CM | POA: Diagnosis not present

## 2018-12-11 ENCOUNTER — Other Ambulatory Visit: Payer: Self-pay | Admitting: *Deleted

## 2018-12-11 DIAGNOSIS — I151 Hypertension secondary to other renal disorders: Secondary | ICD-10-CM

## 2018-12-11 DIAGNOSIS — N2889 Other specified disorders of kidney and ureter: Principal | ICD-10-CM

## 2018-12-11 MED ORDER — LISINOPRIL 10 MG PO TABS: 20.0000 mg | ORAL_TABLET | Freq: Every day | ORAL | 3 refills | Status: AC

## 2018-12-11 NOTE — Telephone Encounter (Signed)
Requesting 90 day supply. Thanks 

## 2018-12-11 NOTE — Telephone Encounter (Signed)
Refill approved.

## 2018-12-11 NOTE — Telephone Encounter (Signed)
Refill Approved

## 2018-12-12 DIAGNOSIS — N2581 Secondary hyperparathyroidism of renal origin: Secondary | ICD-10-CM | POA: Diagnosis not present

## 2018-12-12 DIAGNOSIS — D631 Anemia in chronic kidney disease: Secondary | ICD-10-CM | POA: Diagnosis not present

## 2018-12-12 DIAGNOSIS — N186 End stage renal disease: Secondary | ICD-10-CM | POA: Diagnosis not present

## 2018-12-15 DIAGNOSIS — D631 Anemia in chronic kidney disease: Secondary | ICD-10-CM | POA: Diagnosis not present

## 2018-12-15 DIAGNOSIS — N186 End stage renal disease: Secondary | ICD-10-CM | POA: Diagnosis not present

## 2018-12-15 DIAGNOSIS — Z992 Dependence on renal dialysis: Secondary | ICD-10-CM | POA: Diagnosis not present

## 2018-12-15 DIAGNOSIS — D529 Folate deficiency anemia, unspecified: Secondary | ICD-10-CM | POA: Diagnosis not present

## 2018-12-15 DIAGNOSIS — N2581 Secondary hyperparathyroidism of renal origin: Secondary | ICD-10-CM | POA: Diagnosis not present

## 2018-12-17 DIAGNOSIS — D529 Folate deficiency anemia, unspecified: Secondary | ICD-10-CM | POA: Diagnosis not present

## 2018-12-17 DIAGNOSIS — E119 Type 2 diabetes mellitus without complications: Secondary | ICD-10-CM | POA: Diagnosis not present

## 2018-12-17 DIAGNOSIS — R5383 Other fatigue: Secondary | ICD-10-CM | POA: Diagnosis not present

## 2018-12-17 DIAGNOSIS — D631 Anemia in chronic kidney disease: Secondary | ICD-10-CM | POA: Diagnosis not present

## 2018-12-17 DIAGNOSIS — N2581 Secondary hyperparathyroidism of renal origin: Secondary | ICD-10-CM | POA: Diagnosis not present

## 2018-12-17 DIAGNOSIS — Z992 Dependence on renal dialysis: Secondary | ICD-10-CM | POA: Diagnosis not present

## 2018-12-17 DIAGNOSIS — N186 End stage renal disease: Secondary | ICD-10-CM | POA: Diagnosis not present

## 2018-12-18 ENCOUNTER — Encounter (HOSPITAL_BASED_OUTPATIENT_CLINIC_OR_DEPARTMENT_OTHER): Payer: Medicare Other | Attending: Internal Medicine

## 2018-12-19 DIAGNOSIS — Z992 Dependence on renal dialysis: Secondary | ICD-10-CM | POA: Diagnosis not present

## 2018-12-19 DIAGNOSIS — D631 Anemia in chronic kidney disease: Secondary | ICD-10-CM | POA: Diagnosis not present

## 2018-12-19 DIAGNOSIS — D529 Folate deficiency anemia, unspecified: Secondary | ICD-10-CM | POA: Diagnosis not present

## 2018-12-19 DIAGNOSIS — N186 End stage renal disease: Secondary | ICD-10-CM | POA: Diagnosis not present

## 2018-12-19 DIAGNOSIS — N2581 Secondary hyperparathyroidism of renal origin: Secondary | ICD-10-CM | POA: Diagnosis not present

## 2018-12-22 DIAGNOSIS — D529 Folate deficiency anemia, unspecified: Secondary | ICD-10-CM | POA: Diagnosis not present

## 2018-12-22 DIAGNOSIS — Z992 Dependence on renal dialysis: Secondary | ICD-10-CM | POA: Diagnosis not present

## 2018-12-22 DIAGNOSIS — N186 End stage renal disease: Secondary | ICD-10-CM | POA: Diagnosis not present

## 2018-12-22 DIAGNOSIS — N2581 Secondary hyperparathyroidism of renal origin: Secondary | ICD-10-CM | POA: Diagnosis not present

## 2018-12-22 DIAGNOSIS — D631 Anemia in chronic kidney disease: Secondary | ICD-10-CM | POA: Diagnosis not present

## 2018-12-24 DIAGNOSIS — D529 Folate deficiency anemia, unspecified: Secondary | ICD-10-CM | POA: Diagnosis not present

## 2018-12-24 DIAGNOSIS — D631 Anemia in chronic kidney disease: Secondary | ICD-10-CM | POA: Diagnosis not present

## 2018-12-24 DIAGNOSIS — N186 End stage renal disease: Secondary | ICD-10-CM | POA: Diagnosis not present

## 2018-12-24 DIAGNOSIS — N2581 Secondary hyperparathyroidism of renal origin: Secondary | ICD-10-CM | POA: Diagnosis not present

## 2018-12-24 DIAGNOSIS — Z992 Dependence on renal dialysis: Secondary | ICD-10-CM | POA: Diagnosis not present

## 2018-12-26 DIAGNOSIS — N2581 Secondary hyperparathyroidism of renal origin: Secondary | ICD-10-CM | POA: Diagnosis not present

## 2018-12-26 DIAGNOSIS — D631 Anemia in chronic kidney disease: Secondary | ICD-10-CM | POA: Diagnosis not present

## 2018-12-26 DIAGNOSIS — N186 End stage renal disease: Secondary | ICD-10-CM | POA: Diagnosis not present

## 2018-12-26 DIAGNOSIS — Z992 Dependence on renal dialysis: Secondary | ICD-10-CM | POA: Diagnosis not present

## 2018-12-26 DIAGNOSIS — D529 Folate deficiency anemia, unspecified: Secondary | ICD-10-CM | POA: Diagnosis not present

## 2018-12-29 DIAGNOSIS — D529 Folate deficiency anemia, unspecified: Secondary | ICD-10-CM | POA: Diagnosis not present

## 2018-12-29 DIAGNOSIS — D631 Anemia in chronic kidney disease: Secondary | ICD-10-CM | POA: Diagnosis not present

## 2018-12-29 DIAGNOSIS — N186 End stage renal disease: Secondary | ICD-10-CM | POA: Diagnosis not present

## 2018-12-29 DIAGNOSIS — N2581 Secondary hyperparathyroidism of renal origin: Secondary | ICD-10-CM | POA: Diagnosis not present

## 2018-12-29 DIAGNOSIS — Z992 Dependence on renal dialysis: Secondary | ICD-10-CM | POA: Diagnosis not present

## 2018-12-31 DIAGNOSIS — Z992 Dependence on renal dialysis: Secondary | ICD-10-CM | POA: Diagnosis not present

## 2018-12-31 DIAGNOSIS — D631 Anemia in chronic kidney disease: Secondary | ICD-10-CM | POA: Diagnosis not present

## 2018-12-31 DIAGNOSIS — D529 Folate deficiency anemia, unspecified: Secondary | ICD-10-CM | POA: Diagnosis not present

## 2018-12-31 DIAGNOSIS — N186 End stage renal disease: Secondary | ICD-10-CM | POA: Diagnosis not present

## 2018-12-31 DIAGNOSIS — N2581 Secondary hyperparathyroidism of renal origin: Secondary | ICD-10-CM | POA: Diagnosis not present

## 2019-01-02 DIAGNOSIS — D631 Anemia in chronic kidney disease: Secondary | ICD-10-CM | POA: Diagnosis not present

## 2019-01-02 DIAGNOSIS — Z992 Dependence on renal dialysis: Secondary | ICD-10-CM | POA: Diagnosis not present

## 2019-01-02 DIAGNOSIS — N2581 Secondary hyperparathyroidism of renal origin: Secondary | ICD-10-CM | POA: Diagnosis not present

## 2019-01-02 DIAGNOSIS — N186 End stage renal disease: Secondary | ICD-10-CM | POA: Diagnosis not present

## 2019-01-02 DIAGNOSIS — D529 Folate deficiency anemia, unspecified: Secondary | ICD-10-CM | POA: Diagnosis not present

## 2019-01-05 DIAGNOSIS — D631 Anemia in chronic kidney disease: Secondary | ICD-10-CM | POA: Diagnosis not present

## 2019-01-05 DIAGNOSIS — Z992 Dependence on renal dialysis: Secondary | ICD-10-CM | POA: Diagnosis not present

## 2019-01-05 DIAGNOSIS — N186 End stage renal disease: Secondary | ICD-10-CM | POA: Diagnosis not present

## 2019-01-05 DIAGNOSIS — N2581 Secondary hyperparathyroidism of renal origin: Secondary | ICD-10-CM | POA: Diagnosis not present

## 2019-01-05 DIAGNOSIS — D529 Folate deficiency anemia, unspecified: Secondary | ICD-10-CM | POA: Diagnosis not present

## 2019-01-05 DIAGNOSIS — C319 Malignant neoplasm of accessory sinus, unspecified: Secondary | ICD-10-CM | POA: Diagnosis not present

## 2019-01-06 DIAGNOSIS — N186 End stage renal disease: Secondary | ICD-10-CM | POA: Diagnosis not present

## 2019-01-07 DIAGNOSIS — D631 Anemia in chronic kidney disease: Secondary | ICD-10-CM | POA: Diagnosis not present

## 2019-01-07 DIAGNOSIS — N2581 Secondary hyperparathyroidism of renal origin: Secondary | ICD-10-CM | POA: Diagnosis not present

## 2019-01-07 DIAGNOSIS — Z992 Dependence on renal dialysis: Secondary | ICD-10-CM | POA: Diagnosis not present

## 2019-01-07 DIAGNOSIS — N186 End stage renal disease: Secondary | ICD-10-CM | POA: Diagnosis not present

## 2019-01-09 DIAGNOSIS — N2581 Secondary hyperparathyroidism of renal origin: Secondary | ICD-10-CM | POA: Diagnosis not present

## 2019-01-09 DIAGNOSIS — D631 Anemia in chronic kidney disease: Secondary | ICD-10-CM | POA: Diagnosis not present

## 2019-01-09 DIAGNOSIS — Z992 Dependence on renal dialysis: Secondary | ICD-10-CM | POA: Diagnosis not present

## 2019-01-09 DIAGNOSIS — N186 End stage renal disease: Secondary | ICD-10-CM | POA: Diagnosis not present

## 2019-01-12 DIAGNOSIS — D631 Anemia in chronic kidney disease: Secondary | ICD-10-CM | POA: Diagnosis not present

## 2019-01-12 DIAGNOSIS — Z992 Dependence on renal dialysis: Secondary | ICD-10-CM | POA: Diagnosis not present

## 2019-01-12 DIAGNOSIS — N186 End stage renal disease: Secondary | ICD-10-CM | POA: Diagnosis not present

## 2019-01-12 DIAGNOSIS — N2581 Secondary hyperparathyroidism of renal origin: Secondary | ICD-10-CM | POA: Diagnosis not present

## 2019-01-13 DIAGNOSIS — Z992 Dependence on renal dialysis: Secondary | ICD-10-CM | POA: Diagnosis not present

## 2019-01-13 DIAGNOSIS — L84 Corns and callosities: Secondary | ICD-10-CM | POA: Diagnosis not present

## 2019-01-13 DIAGNOSIS — M25511 Pain in right shoulder: Secondary | ICD-10-CM | POA: Diagnosis not present

## 2019-01-13 DIAGNOSIS — N186 End stage renal disease: Secondary | ICD-10-CM | POA: Diagnosis not present

## 2019-01-14 DIAGNOSIS — D631 Anemia in chronic kidney disease: Secondary | ICD-10-CM | POA: Diagnosis not present

## 2019-01-14 DIAGNOSIS — N186 End stage renal disease: Secondary | ICD-10-CM | POA: Diagnosis not present

## 2019-01-14 DIAGNOSIS — Z992 Dependence on renal dialysis: Secondary | ICD-10-CM | POA: Diagnosis not present

## 2019-01-14 DIAGNOSIS — N2581 Secondary hyperparathyroidism of renal origin: Secondary | ICD-10-CM | POA: Diagnosis not present

## 2019-01-16 DIAGNOSIS — L853 Xerosis cutis: Secondary | ICD-10-CM | POA: Diagnosis not present

## 2019-01-16 DIAGNOSIS — E11621 Type 2 diabetes mellitus with foot ulcer: Secondary | ICD-10-CM | POA: Diagnosis not present

## 2019-01-16 DIAGNOSIS — D631 Anemia in chronic kidney disease: Secondary | ICD-10-CM | POA: Diagnosis not present

## 2019-01-16 DIAGNOSIS — E1142 Type 2 diabetes mellitus with diabetic polyneuropathy: Secondary | ICD-10-CM | POA: Diagnosis not present

## 2019-01-16 DIAGNOSIS — L97519 Non-pressure chronic ulcer of other part of right foot with unspecified severity: Secondary | ICD-10-CM | POA: Diagnosis not present

## 2019-01-16 DIAGNOSIS — N2581 Secondary hyperparathyroidism of renal origin: Secondary | ICD-10-CM | POA: Diagnosis not present

## 2019-01-16 DIAGNOSIS — Z992 Dependence on renal dialysis: Secondary | ICD-10-CM | POA: Diagnosis not present

## 2019-01-16 DIAGNOSIS — N186 End stage renal disease: Secondary | ICD-10-CM | POA: Diagnosis not present

## 2019-01-16 DIAGNOSIS — B351 Tinea unguium: Secondary | ICD-10-CM | POA: Diagnosis not present

## 2019-01-19 DIAGNOSIS — D631 Anemia in chronic kidney disease: Secondary | ICD-10-CM | POA: Diagnosis not present

## 2019-01-19 DIAGNOSIS — N186 End stage renal disease: Secondary | ICD-10-CM | POA: Diagnosis not present

## 2019-01-19 DIAGNOSIS — N2581 Secondary hyperparathyroidism of renal origin: Secondary | ICD-10-CM | POA: Diagnosis not present

## 2019-01-19 DIAGNOSIS — Z992 Dependence on renal dialysis: Secondary | ICD-10-CM | POA: Diagnosis not present

## 2019-01-21 DIAGNOSIS — N186 End stage renal disease: Secondary | ICD-10-CM | POA: Diagnosis not present

## 2019-01-21 DIAGNOSIS — D631 Anemia in chronic kidney disease: Secondary | ICD-10-CM | POA: Diagnosis not present

## 2019-01-21 DIAGNOSIS — N2581 Secondary hyperparathyroidism of renal origin: Secondary | ICD-10-CM | POA: Diagnosis not present

## 2019-01-21 DIAGNOSIS — Z992 Dependence on renal dialysis: Secondary | ICD-10-CM | POA: Diagnosis not present

## 2019-01-23 DIAGNOSIS — D631 Anemia in chronic kidney disease: Secondary | ICD-10-CM | POA: Diagnosis not present

## 2019-01-23 DIAGNOSIS — N186 End stage renal disease: Secondary | ICD-10-CM | POA: Diagnosis not present

## 2019-01-23 DIAGNOSIS — N2581 Secondary hyperparathyroidism of renal origin: Secondary | ICD-10-CM | POA: Diagnosis not present

## 2019-01-23 DIAGNOSIS — Z992 Dependence on renal dialysis: Secondary | ICD-10-CM | POA: Diagnosis not present

## 2019-01-26 DIAGNOSIS — Z992 Dependence on renal dialysis: Secondary | ICD-10-CM | POA: Diagnosis not present

## 2019-01-26 DIAGNOSIS — N2581 Secondary hyperparathyroidism of renal origin: Secondary | ICD-10-CM | POA: Diagnosis not present

## 2019-01-26 DIAGNOSIS — N186 End stage renal disease: Secondary | ICD-10-CM | POA: Diagnosis not present

## 2019-01-26 DIAGNOSIS — D631 Anemia in chronic kidney disease: Secondary | ICD-10-CM | POA: Diagnosis not present

## 2019-01-27 DIAGNOSIS — N186 End stage renal disease: Secondary | ICD-10-CM | POA: Diagnosis not present

## 2019-01-27 DIAGNOSIS — Z951 Presence of aortocoronary bypass graft: Secondary | ICD-10-CM | POA: Diagnosis not present

## 2019-01-27 DIAGNOSIS — Z992 Dependence on renal dialysis: Secondary | ICD-10-CM | POA: Diagnosis not present

## 2019-01-27 DIAGNOSIS — I25118 Atherosclerotic heart disease of native coronary artery with other forms of angina pectoris: Secondary | ICD-10-CM | POA: Diagnosis not present

## 2019-01-28 DIAGNOSIS — D631 Anemia in chronic kidney disease: Secondary | ICD-10-CM | POA: Diagnosis not present

## 2019-01-28 DIAGNOSIS — N186 End stage renal disease: Secondary | ICD-10-CM | POA: Diagnosis not present

## 2019-01-28 DIAGNOSIS — Z992 Dependence on renal dialysis: Secondary | ICD-10-CM | POA: Diagnosis not present

## 2019-01-28 DIAGNOSIS — N2581 Secondary hyperparathyroidism of renal origin: Secondary | ICD-10-CM | POA: Diagnosis not present

## 2019-01-30 DIAGNOSIS — N186 End stage renal disease: Secondary | ICD-10-CM | POA: Diagnosis not present

## 2019-01-30 DIAGNOSIS — D631 Anemia in chronic kidney disease: Secondary | ICD-10-CM | POA: Diagnosis not present

## 2019-01-30 DIAGNOSIS — E11621 Type 2 diabetes mellitus with foot ulcer: Secondary | ICD-10-CM | POA: Diagnosis not present

## 2019-01-30 DIAGNOSIS — N2581 Secondary hyperparathyroidism of renal origin: Secondary | ICD-10-CM | POA: Diagnosis not present

## 2019-01-30 DIAGNOSIS — E1142 Type 2 diabetes mellitus with diabetic polyneuropathy: Secondary | ICD-10-CM | POA: Diagnosis not present

## 2019-01-30 DIAGNOSIS — L97511 Non-pressure chronic ulcer of other part of right foot limited to breakdown of skin: Secondary | ICD-10-CM | POA: Diagnosis not present

## 2019-01-30 DIAGNOSIS — Z992 Dependence on renal dialysis: Secondary | ICD-10-CM | POA: Diagnosis not present

## 2019-02-02 DIAGNOSIS — N186 End stage renal disease: Secondary | ICD-10-CM | POA: Diagnosis not present

## 2019-02-02 DIAGNOSIS — N2581 Secondary hyperparathyroidism of renal origin: Secondary | ICD-10-CM | POA: Diagnosis not present

## 2019-02-02 DIAGNOSIS — D631 Anemia in chronic kidney disease: Secondary | ICD-10-CM | POA: Diagnosis not present

## 2019-02-02 DIAGNOSIS — Z992 Dependence on renal dialysis: Secondary | ICD-10-CM | POA: Diagnosis not present

## 2019-02-04 DIAGNOSIS — D631 Anemia in chronic kidney disease: Secondary | ICD-10-CM | POA: Diagnosis not present

## 2019-02-04 DIAGNOSIS — N2581 Secondary hyperparathyroidism of renal origin: Secondary | ICD-10-CM | POA: Diagnosis not present

## 2019-02-04 DIAGNOSIS — Z992 Dependence on renal dialysis: Secondary | ICD-10-CM | POA: Diagnosis not present

## 2019-02-04 DIAGNOSIS — N186 End stage renal disease: Secondary | ICD-10-CM | POA: Diagnosis not present

## 2019-02-05 DIAGNOSIS — I2789 Other specified pulmonary heart diseases: Secondary | ICD-10-CM | POA: Diagnosis not present

## 2019-02-05 DIAGNOSIS — I359 Nonrheumatic aortic valve disorder, unspecified: Secondary | ICD-10-CM | POA: Diagnosis not present

## 2019-02-05 DIAGNOSIS — N186 End stage renal disease: Secondary | ICD-10-CM | POA: Diagnosis not present

## 2019-02-05 DIAGNOSIS — R0609 Other forms of dyspnea: Secondary | ICD-10-CM | POA: Diagnosis not present

## 2019-02-05 DIAGNOSIS — R0602 Shortness of breath: Secondary | ICD-10-CM | POA: Diagnosis not present

## 2019-02-05 DIAGNOSIS — R079 Chest pain, unspecified: Secondary | ICD-10-CM | POA: Diagnosis not present

## 2019-02-05 DIAGNOSIS — I078 Other rheumatic tricuspid valve diseases: Secondary | ICD-10-CM | POA: Diagnosis not present

## 2019-02-05 DIAGNOSIS — Z952 Presence of prosthetic heart valve: Secondary | ICD-10-CM | POA: Diagnosis not present

## 2019-02-05 DIAGNOSIS — I071 Rheumatic tricuspid insufficiency: Secondary | ICD-10-CM | POA: Diagnosis not present

## 2019-02-06 DIAGNOSIS — D631 Anemia in chronic kidney disease: Secondary | ICD-10-CM | POA: Diagnosis not present

## 2019-02-06 DIAGNOSIS — Z992 Dependence on renal dialysis: Secondary | ICD-10-CM | POA: Diagnosis not present

## 2019-02-06 DIAGNOSIS — N189 Chronic kidney disease, unspecified: Secondary | ICD-10-CM | POA: Diagnosis not present

## 2019-02-06 DIAGNOSIS — E875 Hyperkalemia: Secondary | ICD-10-CM | POA: Diagnosis not present

## 2019-02-06 DIAGNOSIS — N186 End stage renal disease: Secondary | ICD-10-CM | POA: Diagnosis not present

## 2019-02-06 DIAGNOSIS — N2581 Secondary hyperparathyroidism of renal origin: Secondary | ICD-10-CM | POA: Diagnosis not present

## 2019-02-09 DIAGNOSIS — Z992 Dependence on renal dialysis: Secondary | ICD-10-CM | POA: Diagnosis not present

## 2019-02-09 DIAGNOSIS — N2581 Secondary hyperparathyroidism of renal origin: Secondary | ICD-10-CM | POA: Diagnosis not present

## 2019-02-09 DIAGNOSIS — D631 Anemia in chronic kidney disease: Secondary | ICD-10-CM | POA: Diagnosis not present

## 2019-02-09 DIAGNOSIS — N186 End stage renal disease: Secondary | ICD-10-CM | POA: Diagnosis not present

## 2019-02-10 DIAGNOSIS — G8929 Other chronic pain: Secondary | ICD-10-CM | POA: Diagnosis not present

## 2019-02-10 DIAGNOSIS — M25511 Pain in right shoulder: Secondary | ICD-10-CM | POA: Diagnosis not present

## 2019-02-10 DIAGNOSIS — M546 Pain in thoracic spine: Secondary | ICD-10-CM | POA: Diagnosis not present

## 2019-02-10 DIAGNOSIS — N186 End stage renal disease: Secondary | ICD-10-CM | POA: Diagnosis not present

## 2019-02-10 DIAGNOSIS — Z992 Dependence on renal dialysis: Secondary | ICD-10-CM | POA: Diagnosis not present

## 2019-02-11 DIAGNOSIS — N186 End stage renal disease: Secondary | ICD-10-CM | POA: Diagnosis not present

## 2019-02-11 DIAGNOSIS — Z992 Dependence on renal dialysis: Secondary | ICD-10-CM | POA: Diagnosis not present

## 2019-02-11 DIAGNOSIS — D631 Anemia in chronic kidney disease: Secondary | ICD-10-CM | POA: Diagnosis not present

## 2019-02-11 DIAGNOSIS — N2581 Secondary hyperparathyroidism of renal origin: Secondary | ICD-10-CM | POA: Diagnosis not present

## 2019-02-13 DIAGNOSIS — F411 Generalized anxiety disorder: Secondary | ICD-10-CM | POA: Diagnosis not present

## 2019-02-13 DIAGNOSIS — N2581 Secondary hyperparathyroidism of renal origin: Secondary | ICD-10-CM | POA: Diagnosis not present

## 2019-02-13 DIAGNOSIS — D631 Anemia in chronic kidney disease: Secondary | ICD-10-CM | POA: Diagnosis not present

## 2019-02-13 DIAGNOSIS — N186 End stage renal disease: Secondary | ICD-10-CM | POA: Diagnosis not present

## 2019-02-13 DIAGNOSIS — Z992 Dependence on renal dialysis: Secondary | ICD-10-CM | POA: Diagnosis not present

## 2019-02-13 DIAGNOSIS — F332 Major depressive disorder, recurrent severe without psychotic features: Secondary | ICD-10-CM | POA: Diagnosis not present

## 2019-02-16 DIAGNOSIS — Z992 Dependence on renal dialysis: Secondary | ICD-10-CM | POA: Diagnosis not present

## 2019-02-16 DIAGNOSIS — N186 End stage renal disease: Secondary | ICD-10-CM | POA: Diagnosis not present

## 2019-02-16 DIAGNOSIS — D631 Anemia in chronic kidney disease: Secondary | ICD-10-CM | POA: Diagnosis not present

## 2019-02-16 DIAGNOSIS — N2581 Secondary hyperparathyroidism of renal origin: Secondary | ICD-10-CM | POA: Diagnosis not present

## 2019-02-18 DIAGNOSIS — Z992 Dependence on renal dialysis: Secondary | ICD-10-CM | POA: Diagnosis not present

## 2019-02-18 DIAGNOSIS — N186 End stage renal disease: Secondary | ICD-10-CM | POA: Diagnosis not present

## 2019-02-18 DIAGNOSIS — N2581 Secondary hyperparathyroidism of renal origin: Secondary | ICD-10-CM | POA: Diagnosis not present

## 2019-02-18 DIAGNOSIS — D631 Anemia in chronic kidney disease: Secondary | ICD-10-CM | POA: Diagnosis not present

## 2019-02-19 DIAGNOSIS — F332 Major depressive disorder, recurrent severe without psychotic features: Secondary | ICD-10-CM | POA: Diagnosis not present

## 2019-02-20 DIAGNOSIS — N186 End stage renal disease: Secondary | ICD-10-CM | POA: Diagnosis not present

## 2019-02-20 DIAGNOSIS — N2581 Secondary hyperparathyroidism of renal origin: Secondary | ICD-10-CM | POA: Diagnosis not present

## 2019-02-20 DIAGNOSIS — Z992 Dependence on renal dialysis: Secondary | ICD-10-CM | POA: Diagnosis not present

## 2019-02-20 DIAGNOSIS — D631 Anemia in chronic kidney disease: Secondary | ICD-10-CM | POA: Diagnosis not present

## 2019-02-23 DIAGNOSIS — Z992 Dependence on renal dialysis: Secondary | ICD-10-CM | POA: Diagnosis not present

## 2019-02-23 DIAGNOSIS — N2581 Secondary hyperparathyroidism of renal origin: Secondary | ICD-10-CM | POA: Diagnosis not present

## 2019-02-23 DIAGNOSIS — D631 Anemia in chronic kidney disease: Secondary | ICD-10-CM | POA: Diagnosis not present

## 2019-02-23 DIAGNOSIS — N186 End stage renal disease: Secondary | ICD-10-CM | POA: Diagnosis not present

## 2019-02-24 DIAGNOSIS — I509 Heart failure, unspecified: Secondary | ICD-10-CM | POA: Diagnosis not present

## 2019-02-24 DIAGNOSIS — F17200 Nicotine dependence, unspecified, uncomplicated: Secondary | ICD-10-CM | POA: Diagnosis not present

## 2019-02-24 DIAGNOSIS — E1122 Type 2 diabetes mellitus with diabetic chronic kidney disease: Secondary | ICD-10-CM | POA: Diagnosis not present

## 2019-02-24 DIAGNOSIS — I132 Hypertensive heart and chronic kidney disease with heart failure and with stage 5 chronic kidney disease, or end stage renal disease: Secondary | ICD-10-CM | POA: Diagnosis not present

## 2019-02-24 DIAGNOSIS — L97519 Non-pressure chronic ulcer of other part of right foot with unspecified severity: Secondary | ICD-10-CM | POA: Diagnosis not present

## 2019-02-24 DIAGNOSIS — I2510 Atherosclerotic heart disease of native coronary artery without angina pectoris: Secondary | ICD-10-CM | POA: Diagnosis not present

## 2019-02-24 DIAGNOSIS — Z992 Dependence on renal dialysis: Secondary | ICD-10-CM | POA: Diagnosis not present

## 2019-02-24 DIAGNOSIS — E1142 Type 2 diabetes mellitus with diabetic polyneuropathy: Secondary | ICD-10-CM | POA: Diagnosis not present

## 2019-02-24 DIAGNOSIS — E785 Hyperlipidemia, unspecified: Secondary | ICD-10-CM | POA: Diagnosis not present

## 2019-02-24 DIAGNOSIS — L97512 Non-pressure chronic ulcer of other part of right foot with fat layer exposed: Secondary | ICD-10-CM | POA: Diagnosis not present

## 2019-02-24 DIAGNOSIS — N186 End stage renal disease: Secondary | ICD-10-CM | POA: Diagnosis not present

## 2019-02-24 DIAGNOSIS — E11621 Type 2 diabetes mellitus with foot ulcer: Secondary | ICD-10-CM | POA: Diagnosis not present

## 2019-02-25 DIAGNOSIS — D631 Anemia in chronic kidney disease: Secondary | ICD-10-CM | POA: Diagnosis not present

## 2019-02-25 DIAGNOSIS — N186 End stage renal disease: Secondary | ICD-10-CM | POA: Diagnosis not present

## 2019-02-25 DIAGNOSIS — N2581 Secondary hyperparathyroidism of renal origin: Secondary | ICD-10-CM | POA: Diagnosis not present

## 2019-02-25 DIAGNOSIS — Z992 Dependence on renal dialysis: Secondary | ICD-10-CM | POA: Diagnosis not present

## 2019-02-26 DIAGNOSIS — F411 Generalized anxiety disorder: Secondary | ICD-10-CM | POA: Diagnosis not present

## 2019-02-26 DIAGNOSIS — F332 Major depressive disorder, recurrent severe without psychotic features: Secondary | ICD-10-CM | POA: Diagnosis not present

## 2019-02-27 DIAGNOSIS — N2581 Secondary hyperparathyroidism of renal origin: Secondary | ICD-10-CM | POA: Diagnosis not present

## 2019-02-27 DIAGNOSIS — Z992 Dependence on renal dialysis: Secondary | ICD-10-CM | POA: Diagnosis not present

## 2019-02-27 DIAGNOSIS — N186 End stage renal disease: Secondary | ICD-10-CM | POA: Diagnosis not present

## 2019-02-27 DIAGNOSIS — D631 Anemia in chronic kidney disease: Secondary | ICD-10-CM | POA: Diagnosis not present

## 2019-03-02 DIAGNOSIS — Z992 Dependence on renal dialysis: Secondary | ICD-10-CM | POA: Diagnosis not present

## 2019-03-02 DIAGNOSIS — N186 End stage renal disease: Secondary | ICD-10-CM | POA: Diagnosis not present

## 2019-03-02 DIAGNOSIS — N2581 Secondary hyperparathyroidism of renal origin: Secondary | ICD-10-CM | POA: Diagnosis not present

## 2019-03-02 DIAGNOSIS — D631 Anemia in chronic kidney disease: Secondary | ICD-10-CM | POA: Diagnosis not present

## 2019-03-03 DIAGNOSIS — F332 Major depressive disorder, recurrent severe without psychotic features: Secondary | ICD-10-CM | POA: Diagnosis not present

## 2019-03-04 DIAGNOSIS — Z992 Dependence on renal dialysis: Secondary | ICD-10-CM | POA: Diagnosis not present

## 2019-03-04 DIAGNOSIS — N186 End stage renal disease: Secondary | ICD-10-CM | POA: Diagnosis not present

## 2019-03-04 DIAGNOSIS — N2581 Secondary hyperparathyroidism of renal origin: Secondary | ICD-10-CM | POA: Diagnosis not present

## 2019-03-04 DIAGNOSIS — D631 Anemia in chronic kidney disease: Secondary | ICD-10-CM | POA: Diagnosis not present

## 2019-03-06 DIAGNOSIS — Z992 Dependence on renal dialysis: Secondary | ICD-10-CM | POA: Diagnosis not present

## 2019-03-06 DIAGNOSIS — N2581 Secondary hyperparathyroidism of renal origin: Secondary | ICD-10-CM | POA: Diagnosis not present

## 2019-03-06 DIAGNOSIS — N186 End stage renal disease: Secondary | ICD-10-CM | POA: Diagnosis not present

## 2019-03-06 DIAGNOSIS — D631 Anemia in chronic kidney disease: Secondary | ICD-10-CM | POA: Diagnosis not present

## 2019-03-08 DIAGNOSIS — N186 End stage renal disease: Secondary | ICD-10-CM | POA: Diagnosis not present

## 2019-03-09 DIAGNOSIS — R1110 Vomiting, unspecified: Secondary | ICD-10-CM | POA: Diagnosis not present

## 2019-03-09 DIAGNOSIS — N2581 Secondary hyperparathyroidism of renal origin: Secondary | ICD-10-CM | POA: Diagnosis not present

## 2019-03-09 DIAGNOSIS — Z992 Dependence on renal dialysis: Secondary | ICD-10-CM | POA: Diagnosis not present

## 2019-03-09 DIAGNOSIS — R110 Nausea: Secondary | ICD-10-CM | POA: Diagnosis not present

## 2019-03-09 DIAGNOSIS — D631 Anemia in chronic kidney disease: Secondary | ICD-10-CM | POA: Diagnosis not present

## 2019-03-09 DIAGNOSIS — N186 End stage renal disease: Secondary | ICD-10-CM | POA: Diagnosis not present

## 2019-03-11 DIAGNOSIS — E119 Type 2 diabetes mellitus without complications: Secondary | ICD-10-CM | POA: Diagnosis not present

## 2019-03-11 DIAGNOSIS — R110 Nausea: Secondary | ICD-10-CM | POA: Diagnosis not present

## 2019-03-11 DIAGNOSIS — N189 Chronic kidney disease, unspecified: Secondary | ICD-10-CM | POA: Diagnosis not present

## 2019-03-11 DIAGNOSIS — R1110 Vomiting, unspecified: Secondary | ICD-10-CM | POA: Diagnosis not present

## 2019-03-11 DIAGNOSIS — D631 Anemia in chronic kidney disease: Secondary | ICD-10-CM | POA: Diagnosis not present

## 2019-03-11 DIAGNOSIS — Z992 Dependence on renal dialysis: Secondary | ICD-10-CM | POA: Diagnosis not present

## 2019-03-11 DIAGNOSIS — E782 Mixed hyperlipidemia: Secondary | ICD-10-CM | POA: Diagnosis not present

## 2019-03-11 DIAGNOSIS — N2581 Secondary hyperparathyroidism of renal origin: Secondary | ICD-10-CM | POA: Diagnosis not present

## 2019-03-11 DIAGNOSIS — N186 End stage renal disease: Secondary | ICD-10-CM | POA: Diagnosis not present

## 2019-03-13 DIAGNOSIS — R110 Nausea: Secondary | ICD-10-CM | POA: Diagnosis not present

## 2019-03-13 DIAGNOSIS — R1110 Vomiting, unspecified: Secondary | ICD-10-CM | POA: Diagnosis not present

## 2019-03-13 DIAGNOSIS — D631 Anemia in chronic kidney disease: Secondary | ICD-10-CM | POA: Diagnosis not present

## 2019-03-13 DIAGNOSIS — Z992 Dependence on renal dialysis: Secondary | ICD-10-CM | POA: Diagnosis not present

## 2019-03-13 DIAGNOSIS — N186 End stage renal disease: Secondary | ICD-10-CM | POA: Diagnosis not present

## 2019-03-13 DIAGNOSIS — N2581 Secondary hyperparathyroidism of renal origin: Secondary | ICD-10-CM | POA: Diagnosis not present

## 2019-03-16 DIAGNOSIS — R110 Nausea: Secondary | ICD-10-CM | POA: Diagnosis not present

## 2019-03-16 DIAGNOSIS — N186 End stage renal disease: Secondary | ICD-10-CM | POA: Diagnosis not present

## 2019-03-16 DIAGNOSIS — Z992 Dependence on renal dialysis: Secondary | ICD-10-CM | POA: Diagnosis not present

## 2019-03-16 DIAGNOSIS — R1110 Vomiting, unspecified: Secondary | ICD-10-CM | POA: Diagnosis not present

## 2019-03-16 DIAGNOSIS — D631 Anemia in chronic kidney disease: Secondary | ICD-10-CM | POA: Diagnosis not present

## 2019-03-16 DIAGNOSIS — N2581 Secondary hyperparathyroidism of renal origin: Secondary | ICD-10-CM | POA: Diagnosis not present

## 2019-03-17 DIAGNOSIS — L97511 Non-pressure chronic ulcer of other part of right foot limited to breakdown of skin: Secondary | ICD-10-CM | POA: Diagnosis not present

## 2019-03-17 DIAGNOSIS — E11621 Type 2 diabetes mellitus with foot ulcer: Secondary | ICD-10-CM | POA: Diagnosis not present

## 2019-03-18 DIAGNOSIS — N186 End stage renal disease: Secondary | ICD-10-CM | POA: Diagnosis not present

## 2019-03-18 DIAGNOSIS — R110 Nausea: Secondary | ICD-10-CM | POA: Diagnosis not present

## 2019-03-18 DIAGNOSIS — R1110 Vomiting, unspecified: Secondary | ICD-10-CM | POA: Diagnosis not present

## 2019-03-18 DIAGNOSIS — Z992 Dependence on renal dialysis: Secondary | ICD-10-CM | POA: Diagnosis not present

## 2019-03-18 DIAGNOSIS — N2581 Secondary hyperparathyroidism of renal origin: Secondary | ICD-10-CM | POA: Diagnosis not present

## 2019-03-18 DIAGNOSIS — D631 Anemia in chronic kidney disease: Secondary | ICD-10-CM | POA: Diagnosis not present

## 2019-03-20 DIAGNOSIS — N2581 Secondary hyperparathyroidism of renal origin: Secondary | ICD-10-CM | POA: Diagnosis not present

## 2019-03-20 DIAGNOSIS — R1110 Vomiting, unspecified: Secondary | ICD-10-CM | POA: Diagnosis not present

## 2019-03-20 DIAGNOSIS — D631 Anemia in chronic kidney disease: Secondary | ICD-10-CM | POA: Diagnosis not present

## 2019-03-20 DIAGNOSIS — R110 Nausea: Secondary | ICD-10-CM | POA: Diagnosis not present

## 2019-03-20 DIAGNOSIS — N186 End stage renal disease: Secondary | ICD-10-CM | POA: Diagnosis not present

## 2019-03-20 DIAGNOSIS — Z992 Dependence on renal dialysis: Secondary | ICD-10-CM | POA: Diagnosis not present

## 2019-03-23 DIAGNOSIS — R1110 Vomiting, unspecified: Secondary | ICD-10-CM | POA: Diagnosis not present

## 2019-03-23 DIAGNOSIS — D631 Anemia in chronic kidney disease: Secondary | ICD-10-CM | POA: Diagnosis not present

## 2019-03-23 DIAGNOSIS — N2581 Secondary hyperparathyroidism of renal origin: Secondary | ICD-10-CM | POA: Diagnosis not present

## 2019-03-23 DIAGNOSIS — R110 Nausea: Secondary | ICD-10-CM | POA: Diagnosis not present

## 2019-03-23 DIAGNOSIS — N186 End stage renal disease: Secondary | ICD-10-CM | POA: Diagnosis not present

## 2019-03-23 DIAGNOSIS — Z992 Dependence on renal dialysis: Secondary | ICD-10-CM | POA: Diagnosis not present

## 2019-03-25 DIAGNOSIS — R110 Nausea: Secondary | ICD-10-CM | POA: Diagnosis not present

## 2019-03-25 DIAGNOSIS — Z992 Dependence on renal dialysis: Secondary | ICD-10-CM | POA: Diagnosis not present

## 2019-03-25 DIAGNOSIS — N2581 Secondary hyperparathyroidism of renal origin: Secondary | ICD-10-CM | POA: Diagnosis not present

## 2019-03-25 DIAGNOSIS — D631 Anemia in chronic kidney disease: Secondary | ICD-10-CM | POA: Diagnosis not present

## 2019-03-25 DIAGNOSIS — R1110 Vomiting, unspecified: Secondary | ICD-10-CM | POA: Diagnosis not present

## 2019-03-25 DIAGNOSIS — N186 End stage renal disease: Secondary | ICD-10-CM | POA: Diagnosis not present

## 2019-03-27 DIAGNOSIS — N2581 Secondary hyperparathyroidism of renal origin: Secondary | ICD-10-CM | POA: Diagnosis not present

## 2019-03-27 DIAGNOSIS — D631 Anemia in chronic kidney disease: Secondary | ICD-10-CM | POA: Diagnosis not present

## 2019-03-27 DIAGNOSIS — Z992 Dependence on renal dialysis: Secondary | ICD-10-CM | POA: Diagnosis not present

## 2019-03-27 DIAGNOSIS — R110 Nausea: Secondary | ICD-10-CM | POA: Diagnosis not present

## 2019-03-27 DIAGNOSIS — R1110 Vomiting, unspecified: Secondary | ICD-10-CM | POA: Diagnosis not present

## 2019-03-27 DIAGNOSIS — N186 End stage renal disease: Secondary | ICD-10-CM | POA: Diagnosis not present

## 2019-03-30 DIAGNOSIS — Z992 Dependence on renal dialysis: Secondary | ICD-10-CM | POA: Diagnosis not present

## 2019-03-30 DIAGNOSIS — R110 Nausea: Secondary | ICD-10-CM | POA: Diagnosis not present

## 2019-03-30 DIAGNOSIS — N186 End stage renal disease: Secondary | ICD-10-CM | POA: Diagnosis not present

## 2019-03-30 DIAGNOSIS — N2581 Secondary hyperparathyroidism of renal origin: Secondary | ICD-10-CM | POA: Diagnosis not present

## 2019-03-30 DIAGNOSIS — R1110 Vomiting, unspecified: Secondary | ICD-10-CM | POA: Diagnosis not present

## 2019-03-30 DIAGNOSIS — D631 Anemia in chronic kidney disease: Secondary | ICD-10-CM | POA: Diagnosis not present

## 2019-04-01 DIAGNOSIS — Z992 Dependence on renal dialysis: Secondary | ICD-10-CM | POA: Diagnosis not present

## 2019-04-01 DIAGNOSIS — D631 Anemia in chronic kidney disease: Secondary | ICD-10-CM | POA: Diagnosis not present

## 2019-04-01 DIAGNOSIS — N186 End stage renal disease: Secondary | ICD-10-CM | POA: Diagnosis not present

## 2019-04-01 DIAGNOSIS — N2581 Secondary hyperparathyroidism of renal origin: Secondary | ICD-10-CM | POA: Diagnosis not present

## 2019-04-01 DIAGNOSIS — R1110 Vomiting, unspecified: Secondary | ICD-10-CM | POA: Diagnosis not present

## 2019-04-01 DIAGNOSIS — R110 Nausea: Secondary | ICD-10-CM | POA: Diagnosis not present

## 2019-04-03 DIAGNOSIS — Z992 Dependence on renal dialysis: Secondary | ICD-10-CM | POA: Diagnosis not present

## 2019-04-03 DIAGNOSIS — R1110 Vomiting, unspecified: Secondary | ICD-10-CM | POA: Diagnosis not present

## 2019-04-03 DIAGNOSIS — N186 End stage renal disease: Secondary | ICD-10-CM | POA: Diagnosis not present

## 2019-04-03 DIAGNOSIS — D631 Anemia in chronic kidney disease: Secondary | ICD-10-CM | POA: Diagnosis not present

## 2019-04-03 DIAGNOSIS — R110 Nausea: Secondary | ICD-10-CM | POA: Diagnosis not present

## 2019-04-03 DIAGNOSIS — N2581 Secondary hyperparathyroidism of renal origin: Secondary | ICD-10-CM | POA: Diagnosis not present

## 2019-04-06 DIAGNOSIS — N186 End stage renal disease: Secondary | ICD-10-CM | POA: Diagnosis not present

## 2019-04-06 DIAGNOSIS — Z992 Dependence on renal dialysis: Secondary | ICD-10-CM | POA: Diagnosis not present

## 2019-04-06 DIAGNOSIS — R1110 Vomiting, unspecified: Secondary | ICD-10-CM | POA: Diagnosis not present

## 2019-04-06 DIAGNOSIS — N2581 Secondary hyperparathyroidism of renal origin: Secondary | ICD-10-CM | POA: Diagnosis not present

## 2019-04-06 DIAGNOSIS — D631 Anemia in chronic kidney disease: Secondary | ICD-10-CM | POA: Diagnosis not present

## 2019-04-06 DIAGNOSIS — R110 Nausea: Secondary | ICD-10-CM | POA: Diagnosis not present

## 2019-04-07 DIAGNOSIS — N186 End stage renal disease: Secondary | ICD-10-CM | POA: Diagnosis not present

## 2019-04-08 DIAGNOSIS — Z992 Dependence on renal dialysis: Secondary | ICD-10-CM | POA: Diagnosis not present

## 2019-04-08 DIAGNOSIS — N186 End stage renal disease: Secondary | ICD-10-CM | POA: Diagnosis not present

## 2019-04-08 DIAGNOSIS — D631 Anemia in chronic kidney disease: Secondary | ICD-10-CM | POA: Diagnosis not present

## 2019-04-08 DIAGNOSIS — N2581 Secondary hyperparathyroidism of renal origin: Secondary | ICD-10-CM | POA: Diagnosis not present

## 2019-04-10 DIAGNOSIS — D631 Anemia in chronic kidney disease: Secondary | ICD-10-CM | POA: Diagnosis not present

## 2019-04-10 DIAGNOSIS — Z992 Dependence on renal dialysis: Secondary | ICD-10-CM | POA: Diagnosis not present

## 2019-04-10 DIAGNOSIS — N2581 Secondary hyperparathyroidism of renal origin: Secondary | ICD-10-CM | POA: Diagnosis not present

## 2019-04-10 DIAGNOSIS — N186 End stage renal disease: Secondary | ICD-10-CM | POA: Diagnosis not present

## 2019-04-13 DIAGNOSIS — D631 Anemia in chronic kidney disease: Secondary | ICD-10-CM | POA: Diagnosis not present

## 2019-04-13 DIAGNOSIS — N2581 Secondary hyperparathyroidism of renal origin: Secondary | ICD-10-CM | POA: Diagnosis not present

## 2019-04-13 DIAGNOSIS — Z992 Dependence on renal dialysis: Secondary | ICD-10-CM | POA: Diagnosis not present

## 2019-04-13 DIAGNOSIS — N186 End stage renal disease: Secondary | ICD-10-CM | POA: Diagnosis not present

## 2019-04-15 DIAGNOSIS — D631 Anemia in chronic kidney disease: Secondary | ICD-10-CM | POA: Diagnosis not present

## 2019-04-15 DIAGNOSIS — Z992 Dependence on renal dialysis: Secondary | ICD-10-CM | POA: Diagnosis not present

## 2019-04-15 DIAGNOSIS — N2581 Secondary hyperparathyroidism of renal origin: Secondary | ICD-10-CM | POA: Diagnosis not present

## 2019-04-15 DIAGNOSIS — N186 End stage renal disease: Secondary | ICD-10-CM | POA: Diagnosis not present

## 2019-04-16 DIAGNOSIS — F0630 Mood disorder due to known physiological condition, unspecified: Secondary | ICD-10-CM | POA: Diagnosis not present

## 2019-04-16 DIAGNOSIS — F332 Major depressive disorder, recurrent severe without psychotic features: Secondary | ICD-10-CM | POA: Diagnosis not present

## 2019-04-17 DIAGNOSIS — N186 End stage renal disease: Secondary | ICD-10-CM | POA: Diagnosis not present

## 2019-04-17 DIAGNOSIS — Z992 Dependence on renal dialysis: Secondary | ICD-10-CM | POA: Diagnosis not present

## 2019-04-17 DIAGNOSIS — N2581 Secondary hyperparathyroidism of renal origin: Secondary | ICD-10-CM | POA: Diagnosis not present

## 2019-04-17 DIAGNOSIS — D631 Anemia in chronic kidney disease: Secondary | ICD-10-CM | POA: Diagnosis not present

## 2019-04-20 DIAGNOSIS — Z992 Dependence on renal dialysis: Secondary | ICD-10-CM | POA: Diagnosis not present

## 2019-04-20 DIAGNOSIS — N2581 Secondary hyperparathyroidism of renal origin: Secondary | ICD-10-CM | POA: Diagnosis not present

## 2019-04-20 DIAGNOSIS — N186 End stage renal disease: Secondary | ICD-10-CM | POA: Diagnosis not present

## 2019-04-20 DIAGNOSIS — D631 Anemia in chronic kidney disease: Secondary | ICD-10-CM | POA: Diagnosis not present

## 2019-04-21 DIAGNOSIS — L853 Xerosis cutis: Secondary | ICD-10-CM | POA: Diagnosis not present

## 2019-04-21 DIAGNOSIS — B351 Tinea unguium: Secondary | ICD-10-CM | POA: Diagnosis not present

## 2019-04-21 DIAGNOSIS — E1142 Type 2 diabetes mellitus with diabetic polyneuropathy: Secondary | ICD-10-CM | POA: Diagnosis not present

## 2019-04-22 DIAGNOSIS — N186 End stage renal disease: Secondary | ICD-10-CM | POA: Diagnosis not present

## 2019-04-22 DIAGNOSIS — Z992 Dependence on renal dialysis: Secondary | ICD-10-CM | POA: Diagnosis not present

## 2019-04-22 DIAGNOSIS — D631 Anemia in chronic kidney disease: Secondary | ICD-10-CM | POA: Diagnosis not present

## 2019-04-22 DIAGNOSIS — N2581 Secondary hyperparathyroidism of renal origin: Secondary | ICD-10-CM | POA: Diagnosis not present

## 2019-04-23 DIAGNOSIS — F332 Major depressive disorder, recurrent severe without psychotic features: Secondary | ICD-10-CM | POA: Diagnosis not present

## 2019-04-24 DIAGNOSIS — Z992 Dependence on renal dialysis: Secondary | ICD-10-CM | POA: Diagnosis not present

## 2019-04-24 DIAGNOSIS — N2581 Secondary hyperparathyroidism of renal origin: Secondary | ICD-10-CM | POA: Diagnosis not present

## 2019-04-24 DIAGNOSIS — N186 End stage renal disease: Secondary | ICD-10-CM | POA: Diagnosis not present

## 2019-04-24 DIAGNOSIS — D631 Anemia in chronic kidney disease: Secondary | ICD-10-CM | POA: Diagnosis not present

## 2019-04-27 DIAGNOSIS — D631 Anemia in chronic kidney disease: Secondary | ICD-10-CM | POA: Diagnosis not present

## 2019-04-27 DIAGNOSIS — N2581 Secondary hyperparathyroidism of renal origin: Secondary | ICD-10-CM | POA: Diagnosis not present

## 2019-04-27 DIAGNOSIS — Z992 Dependence on renal dialysis: Secondary | ICD-10-CM | POA: Diagnosis not present

## 2019-04-27 DIAGNOSIS — N186 End stage renal disease: Secondary | ICD-10-CM | POA: Diagnosis not present

## 2019-04-29 DIAGNOSIS — D631 Anemia in chronic kidney disease: Secondary | ICD-10-CM | POA: Diagnosis not present

## 2019-04-29 DIAGNOSIS — N186 End stage renal disease: Secondary | ICD-10-CM | POA: Diagnosis not present

## 2019-04-29 DIAGNOSIS — Z992 Dependence on renal dialysis: Secondary | ICD-10-CM | POA: Diagnosis not present

## 2019-04-29 DIAGNOSIS — N2581 Secondary hyperparathyroidism of renal origin: Secondary | ICD-10-CM | POA: Diagnosis not present

## 2019-05-01 DIAGNOSIS — Z992 Dependence on renal dialysis: Secondary | ICD-10-CM | POA: Diagnosis not present

## 2019-05-01 DIAGNOSIS — D631 Anemia in chronic kidney disease: Secondary | ICD-10-CM | POA: Diagnosis not present

## 2019-05-01 DIAGNOSIS — N2581 Secondary hyperparathyroidism of renal origin: Secondary | ICD-10-CM | POA: Diagnosis not present

## 2019-05-01 DIAGNOSIS — N186 End stage renal disease: Secondary | ICD-10-CM | POA: Diagnosis not present

## 2019-05-04 DIAGNOSIS — D631 Anemia in chronic kidney disease: Secondary | ICD-10-CM | POA: Diagnosis not present

## 2019-05-04 DIAGNOSIS — Z992 Dependence on renal dialysis: Secondary | ICD-10-CM | POA: Diagnosis not present

## 2019-05-04 DIAGNOSIS — N186 End stage renal disease: Secondary | ICD-10-CM | POA: Diagnosis not present

## 2019-05-04 DIAGNOSIS — N2581 Secondary hyperparathyroidism of renal origin: Secondary | ICD-10-CM | POA: Diagnosis not present

## 2019-05-06 DIAGNOSIS — N2581 Secondary hyperparathyroidism of renal origin: Secondary | ICD-10-CM | POA: Diagnosis not present

## 2019-05-06 DIAGNOSIS — Z992 Dependence on renal dialysis: Secondary | ICD-10-CM | POA: Diagnosis not present

## 2019-05-06 DIAGNOSIS — D631 Anemia in chronic kidney disease: Secondary | ICD-10-CM | POA: Diagnosis not present

## 2019-05-06 DIAGNOSIS — N186 End stage renal disease: Secondary | ICD-10-CM | POA: Diagnosis not present

## 2019-05-08 DIAGNOSIS — Z992 Dependence on renal dialysis: Secondary | ICD-10-CM | POA: Diagnosis not present

## 2019-05-08 DIAGNOSIS — N2581 Secondary hyperparathyroidism of renal origin: Secondary | ICD-10-CM | POA: Diagnosis not present

## 2019-05-08 DIAGNOSIS — D631 Anemia in chronic kidney disease: Secondary | ICD-10-CM | POA: Diagnosis not present

## 2019-05-08 DIAGNOSIS — N186 End stage renal disease: Secondary | ICD-10-CM | POA: Diagnosis not present

## 2019-05-11 DIAGNOSIS — N186 End stage renal disease: Secondary | ICD-10-CM | POA: Diagnosis not present

## 2019-05-11 DIAGNOSIS — N2581 Secondary hyperparathyroidism of renal origin: Secondary | ICD-10-CM | POA: Diagnosis not present

## 2019-05-11 DIAGNOSIS — R1110 Vomiting, unspecified: Secondary | ICD-10-CM | POA: Diagnosis not present

## 2019-05-11 DIAGNOSIS — D631 Anemia in chronic kidney disease: Secondary | ICD-10-CM | POA: Diagnosis not present

## 2019-05-11 DIAGNOSIS — Z992 Dependence on renal dialysis: Secondary | ICD-10-CM | POA: Diagnosis not present

## 2019-05-11 DIAGNOSIS — R110 Nausea: Secondary | ICD-10-CM | POA: Diagnosis not present

## 2019-05-11 DIAGNOSIS — F419 Anxiety disorder, unspecified: Secondary | ICD-10-CM | POA: Diagnosis not present

## 2019-05-13 DIAGNOSIS — R110 Nausea: Secondary | ICD-10-CM | POA: Diagnosis not present

## 2019-05-13 DIAGNOSIS — N2581 Secondary hyperparathyroidism of renal origin: Secondary | ICD-10-CM | POA: Diagnosis not present

## 2019-05-13 DIAGNOSIS — Z992 Dependence on renal dialysis: Secondary | ICD-10-CM | POA: Diagnosis not present

## 2019-05-13 DIAGNOSIS — R1110 Vomiting, unspecified: Secondary | ICD-10-CM | POA: Diagnosis not present

## 2019-05-13 DIAGNOSIS — F419 Anxiety disorder, unspecified: Secondary | ICD-10-CM | POA: Diagnosis not present

## 2019-05-13 DIAGNOSIS — N186 End stage renal disease: Secondary | ICD-10-CM | POA: Diagnosis not present

## 2019-05-15 DIAGNOSIS — Z992 Dependence on renal dialysis: Secondary | ICD-10-CM | POA: Diagnosis not present

## 2019-05-15 DIAGNOSIS — R110 Nausea: Secondary | ICD-10-CM | POA: Diagnosis not present

## 2019-05-15 DIAGNOSIS — N2581 Secondary hyperparathyroidism of renal origin: Secondary | ICD-10-CM | POA: Diagnosis not present

## 2019-05-15 DIAGNOSIS — F419 Anxiety disorder, unspecified: Secondary | ICD-10-CM | POA: Diagnosis not present

## 2019-05-15 DIAGNOSIS — R1110 Vomiting, unspecified: Secondary | ICD-10-CM | POA: Diagnosis not present

## 2019-05-15 DIAGNOSIS — N186 End stage renal disease: Secondary | ICD-10-CM | POA: Diagnosis not present

## 2019-05-18 DIAGNOSIS — R110 Nausea: Secondary | ICD-10-CM | POA: Diagnosis not present

## 2019-05-18 DIAGNOSIS — N186 End stage renal disease: Secondary | ICD-10-CM | POA: Diagnosis not present

## 2019-05-18 DIAGNOSIS — F419 Anxiety disorder, unspecified: Secondary | ICD-10-CM | POA: Diagnosis not present

## 2019-05-18 DIAGNOSIS — R1110 Vomiting, unspecified: Secondary | ICD-10-CM | POA: Diagnosis not present

## 2019-05-18 DIAGNOSIS — N2581 Secondary hyperparathyroidism of renal origin: Secondary | ICD-10-CM | POA: Diagnosis not present

## 2019-05-18 DIAGNOSIS — Z992 Dependence on renal dialysis: Secondary | ICD-10-CM | POA: Diagnosis not present

## 2019-05-20 DIAGNOSIS — R110 Nausea: Secondary | ICD-10-CM | POA: Diagnosis not present

## 2019-05-20 DIAGNOSIS — N186 End stage renal disease: Secondary | ICD-10-CM | POA: Diagnosis not present

## 2019-05-20 DIAGNOSIS — N2581 Secondary hyperparathyroidism of renal origin: Secondary | ICD-10-CM | POA: Diagnosis not present

## 2019-05-20 DIAGNOSIS — F419 Anxiety disorder, unspecified: Secondary | ICD-10-CM | POA: Diagnosis not present

## 2019-05-20 DIAGNOSIS — R1110 Vomiting, unspecified: Secondary | ICD-10-CM | POA: Diagnosis not present

## 2019-05-20 DIAGNOSIS — Z992 Dependence on renal dialysis: Secondary | ICD-10-CM | POA: Diagnosis not present

## 2019-05-21 DIAGNOSIS — F332 Major depressive disorder, recurrent severe without psychotic features: Secondary | ICD-10-CM | POA: Diagnosis not present

## 2019-05-22 DIAGNOSIS — Z992 Dependence on renal dialysis: Secondary | ICD-10-CM | POA: Diagnosis not present

## 2019-05-22 DIAGNOSIS — N186 End stage renal disease: Secondary | ICD-10-CM | POA: Diagnosis not present

## 2019-05-22 DIAGNOSIS — R110 Nausea: Secondary | ICD-10-CM | POA: Diagnosis not present

## 2019-05-22 DIAGNOSIS — N2581 Secondary hyperparathyroidism of renal origin: Secondary | ICD-10-CM | POA: Diagnosis not present

## 2019-05-22 DIAGNOSIS — R1110 Vomiting, unspecified: Secondary | ICD-10-CM | POA: Diagnosis not present

## 2019-05-22 DIAGNOSIS — F419 Anxiety disorder, unspecified: Secondary | ICD-10-CM | POA: Diagnosis not present

## 2019-05-25 DIAGNOSIS — F419 Anxiety disorder, unspecified: Secondary | ICD-10-CM | POA: Diagnosis not present

## 2019-05-25 DIAGNOSIS — N186 End stage renal disease: Secondary | ICD-10-CM | POA: Diagnosis not present

## 2019-05-25 DIAGNOSIS — Z992 Dependence on renal dialysis: Secondary | ICD-10-CM | POA: Diagnosis not present

## 2019-05-25 DIAGNOSIS — N2581 Secondary hyperparathyroidism of renal origin: Secondary | ICD-10-CM | POA: Diagnosis not present

## 2019-05-25 DIAGNOSIS — R1110 Vomiting, unspecified: Secondary | ICD-10-CM | POA: Diagnosis not present

## 2019-05-25 DIAGNOSIS — R110 Nausea: Secondary | ICD-10-CM | POA: Diagnosis not present

## 2019-05-27 DIAGNOSIS — N186 End stage renal disease: Secondary | ICD-10-CM | POA: Diagnosis not present

## 2019-05-27 DIAGNOSIS — R1110 Vomiting, unspecified: Secondary | ICD-10-CM | POA: Diagnosis not present

## 2019-05-27 DIAGNOSIS — F419 Anxiety disorder, unspecified: Secondary | ICD-10-CM | POA: Diagnosis not present

## 2019-05-27 DIAGNOSIS — N2581 Secondary hyperparathyroidism of renal origin: Secondary | ICD-10-CM | POA: Diagnosis not present

## 2019-05-27 DIAGNOSIS — R110 Nausea: Secondary | ICD-10-CM | POA: Diagnosis not present

## 2019-05-27 DIAGNOSIS — Z992 Dependence on renal dialysis: Secondary | ICD-10-CM | POA: Diagnosis not present

## 2019-05-29 DIAGNOSIS — F419 Anxiety disorder, unspecified: Secondary | ICD-10-CM | POA: Diagnosis not present

## 2019-05-29 DIAGNOSIS — Z992 Dependence on renal dialysis: Secondary | ICD-10-CM | POA: Diagnosis not present

## 2019-05-29 DIAGNOSIS — R1110 Vomiting, unspecified: Secondary | ICD-10-CM | POA: Diagnosis not present

## 2019-05-29 DIAGNOSIS — R110 Nausea: Secondary | ICD-10-CM | POA: Diagnosis not present

## 2019-05-29 DIAGNOSIS — N186 End stage renal disease: Secondary | ICD-10-CM | POA: Diagnosis not present

## 2019-05-29 DIAGNOSIS — N2581 Secondary hyperparathyroidism of renal origin: Secondary | ICD-10-CM | POA: Diagnosis not present

## 2019-06-01 DIAGNOSIS — Z992 Dependence on renal dialysis: Secondary | ICD-10-CM | POA: Diagnosis not present

## 2019-06-01 DIAGNOSIS — R1110 Vomiting, unspecified: Secondary | ICD-10-CM | POA: Diagnosis not present

## 2019-06-01 DIAGNOSIS — R110 Nausea: Secondary | ICD-10-CM | POA: Diagnosis not present

## 2019-06-01 DIAGNOSIS — F419 Anxiety disorder, unspecified: Secondary | ICD-10-CM | POA: Diagnosis not present

## 2019-06-01 DIAGNOSIS — E119 Type 2 diabetes mellitus without complications: Secondary | ICD-10-CM | POA: Diagnosis not present

## 2019-06-01 DIAGNOSIS — N186 End stage renal disease: Secondary | ICD-10-CM | POA: Diagnosis not present

## 2019-06-01 DIAGNOSIS — N2581 Secondary hyperparathyroidism of renal origin: Secondary | ICD-10-CM | POA: Diagnosis not present

## 2019-06-05 DIAGNOSIS — Z992 Dependence on renal dialysis: Secondary | ICD-10-CM | POA: Diagnosis not present

## 2019-06-05 DIAGNOSIS — N186 End stage renal disease: Secondary | ICD-10-CM | POA: Diagnosis not present

## 2019-06-05 DIAGNOSIS — R1110 Vomiting, unspecified: Secondary | ICD-10-CM | POA: Diagnosis not present

## 2019-06-05 DIAGNOSIS — R110 Nausea: Secondary | ICD-10-CM | POA: Diagnosis not present

## 2019-06-05 DIAGNOSIS — F419 Anxiety disorder, unspecified: Secondary | ICD-10-CM | POA: Diagnosis not present

## 2019-06-05 DIAGNOSIS — N2581 Secondary hyperparathyroidism of renal origin: Secondary | ICD-10-CM | POA: Diagnosis not present

## 2019-06-08 DIAGNOSIS — F419 Anxiety disorder, unspecified: Secondary | ICD-10-CM | POA: Diagnosis not present

## 2019-06-08 DIAGNOSIS — R1110 Vomiting, unspecified: Secondary | ICD-10-CM | POA: Diagnosis not present

## 2019-06-08 DIAGNOSIS — N186 End stage renal disease: Secondary | ICD-10-CM | POA: Diagnosis not present

## 2019-06-08 DIAGNOSIS — Z992 Dependence on renal dialysis: Secondary | ICD-10-CM | POA: Diagnosis not present

## 2019-06-08 DIAGNOSIS — N2581 Secondary hyperparathyroidism of renal origin: Secondary | ICD-10-CM | POA: Diagnosis not present

## 2019-06-08 DIAGNOSIS — R110 Nausea: Secondary | ICD-10-CM | POA: Diagnosis not present

## 2019-06-10 DIAGNOSIS — Z992 Dependence on renal dialysis: Secondary | ICD-10-CM | POA: Diagnosis not present

## 2019-06-10 DIAGNOSIS — D631 Anemia in chronic kidney disease: Secondary | ICD-10-CM | POA: Diagnosis not present

## 2019-06-10 DIAGNOSIS — N186 End stage renal disease: Secondary | ICD-10-CM | POA: Diagnosis not present

## 2019-06-10 DIAGNOSIS — F419 Anxiety disorder, unspecified: Secondary | ICD-10-CM | POA: Diagnosis not present

## 2019-06-10 DIAGNOSIS — N2581 Secondary hyperparathyroidism of renal origin: Secondary | ICD-10-CM | POA: Diagnosis not present

## 2019-06-12 DIAGNOSIS — F419 Anxiety disorder, unspecified: Secondary | ICD-10-CM | POA: Diagnosis not present

## 2019-06-12 DIAGNOSIS — Z992 Dependence on renal dialysis: Secondary | ICD-10-CM | POA: Diagnosis not present

## 2019-06-12 DIAGNOSIS — N2581 Secondary hyperparathyroidism of renal origin: Secondary | ICD-10-CM | POA: Diagnosis not present

## 2019-06-12 DIAGNOSIS — N186 End stage renal disease: Secondary | ICD-10-CM | POA: Diagnosis not present

## 2019-06-12 DIAGNOSIS — D631 Anemia in chronic kidney disease: Secondary | ICD-10-CM | POA: Diagnosis not present

## 2019-06-15 DIAGNOSIS — D631 Anemia in chronic kidney disease: Secondary | ICD-10-CM | POA: Diagnosis not present

## 2019-06-15 DIAGNOSIS — N186 End stage renal disease: Secondary | ICD-10-CM | POA: Diagnosis not present

## 2019-06-15 DIAGNOSIS — Z992 Dependence on renal dialysis: Secondary | ICD-10-CM | POA: Diagnosis not present

## 2019-06-15 DIAGNOSIS — F419 Anxiety disorder, unspecified: Secondary | ICD-10-CM | POA: Diagnosis not present

## 2019-06-15 DIAGNOSIS — N2581 Secondary hyperparathyroidism of renal origin: Secondary | ICD-10-CM | POA: Diagnosis not present

## 2019-06-17 DIAGNOSIS — Z992 Dependence on renal dialysis: Secondary | ICD-10-CM | POA: Diagnosis not present

## 2019-06-17 DIAGNOSIS — N2581 Secondary hyperparathyroidism of renal origin: Secondary | ICD-10-CM | POA: Diagnosis not present

## 2019-06-17 DIAGNOSIS — N186 End stage renal disease: Secondary | ICD-10-CM | POA: Diagnosis not present

## 2019-06-17 DIAGNOSIS — F419 Anxiety disorder, unspecified: Secondary | ICD-10-CM | POA: Diagnosis not present

## 2019-06-17 DIAGNOSIS — D631 Anemia in chronic kidney disease: Secondary | ICD-10-CM | POA: Diagnosis not present

## 2019-06-19 DIAGNOSIS — Z992 Dependence on renal dialysis: Secondary | ICD-10-CM | POA: Diagnosis not present

## 2019-06-19 DIAGNOSIS — L853 Xerosis cutis: Secondary | ICD-10-CM | POA: Diagnosis not present

## 2019-06-19 DIAGNOSIS — B351 Tinea unguium: Secondary | ICD-10-CM | POA: Diagnosis not present

## 2019-06-19 DIAGNOSIS — F419 Anxiety disorder, unspecified: Secondary | ICD-10-CM | POA: Diagnosis not present

## 2019-06-19 DIAGNOSIS — E1142 Type 2 diabetes mellitus with diabetic polyneuropathy: Secondary | ICD-10-CM | POA: Diagnosis not present

## 2019-06-19 DIAGNOSIS — N186 End stage renal disease: Secondary | ICD-10-CM | POA: Diagnosis not present

## 2019-06-19 DIAGNOSIS — D631 Anemia in chronic kidney disease: Secondary | ICD-10-CM | POA: Diagnosis not present

## 2019-06-19 DIAGNOSIS — N2581 Secondary hyperparathyroidism of renal origin: Secondary | ICD-10-CM | POA: Diagnosis not present

## 2019-06-22 DIAGNOSIS — N186 End stage renal disease: Secondary | ICD-10-CM | POA: Diagnosis not present

## 2019-06-22 DIAGNOSIS — F419 Anxiety disorder, unspecified: Secondary | ICD-10-CM | POA: Diagnosis not present

## 2019-06-22 DIAGNOSIS — N2581 Secondary hyperparathyroidism of renal origin: Secondary | ICD-10-CM | POA: Diagnosis not present

## 2019-06-22 DIAGNOSIS — D631 Anemia in chronic kidney disease: Secondary | ICD-10-CM | POA: Diagnosis not present

## 2019-06-22 DIAGNOSIS — Z992 Dependence on renal dialysis: Secondary | ICD-10-CM | POA: Diagnosis not present

## 2019-06-24 DIAGNOSIS — F419 Anxiety disorder, unspecified: Secondary | ICD-10-CM | POA: Diagnosis not present

## 2019-06-24 DIAGNOSIS — Z992 Dependence on renal dialysis: Secondary | ICD-10-CM | POA: Diagnosis not present

## 2019-06-24 DIAGNOSIS — N2581 Secondary hyperparathyroidism of renal origin: Secondary | ICD-10-CM | POA: Diagnosis not present

## 2019-06-24 DIAGNOSIS — D631 Anemia in chronic kidney disease: Secondary | ICD-10-CM | POA: Diagnosis not present

## 2019-06-24 DIAGNOSIS — N186 End stage renal disease: Secondary | ICD-10-CM | POA: Diagnosis not present

## 2019-06-26 DIAGNOSIS — Z992 Dependence on renal dialysis: Secondary | ICD-10-CM | POA: Diagnosis not present

## 2019-06-26 DIAGNOSIS — N2581 Secondary hyperparathyroidism of renal origin: Secondary | ICD-10-CM | POA: Diagnosis not present

## 2019-06-26 DIAGNOSIS — D631 Anemia in chronic kidney disease: Secondary | ICD-10-CM | POA: Diagnosis not present

## 2019-06-26 DIAGNOSIS — N186 End stage renal disease: Secondary | ICD-10-CM | POA: Diagnosis not present

## 2019-06-26 DIAGNOSIS — F419 Anxiety disorder, unspecified: Secondary | ICD-10-CM | POA: Diagnosis not present

## 2019-06-29 DIAGNOSIS — N2581 Secondary hyperparathyroidism of renal origin: Secondary | ICD-10-CM | POA: Diagnosis not present

## 2019-06-29 DIAGNOSIS — F419 Anxiety disorder, unspecified: Secondary | ICD-10-CM | POA: Diagnosis not present

## 2019-06-29 DIAGNOSIS — D631 Anemia in chronic kidney disease: Secondary | ICD-10-CM | POA: Diagnosis not present

## 2019-06-29 DIAGNOSIS — N186 End stage renal disease: Secondary | ICD-10-CM | POA: Diagnosis not present

## 2019-06-29 DIAGNOSIS — Z992 Dependence on renal dialysis: Secondary | ICD-10-CM | POA: Diagnosis not present

## 2019-07-01 DIAGNOSIS — D631 Anemia in chronic kidney disease: Secondary | ICD-10-CM | POA: Diagnosis not present

## 2019-07-01 DIAGNOSIS — Z992 Dependence on renal dialysis: Secondary | ICD-10-CM | POA: Diagnosis not present

## 2019-07-01 DIAGNOSIS — N186 End stage renal disease: Secondary | ICD-10-CM | POA: Diagnosis not present

## 2019-07-01 DIAGNOSIS — N2581 Secondary hyperparathyroidism of renal origin: Secondary | ICD-10-CM | POA: Diagnosis not present

## 2019-07-01 DIAGNOSIS — F419 Anxiety disorder, unspecified: Secondary | ICD-10-CM | POA: Diagnosis not present

## 2019-07-03 DIAGNOSIS — Z992 Dependence on renal dialysis: Secondary | ICD-10-CM | POA: Diagnosis not present

## 2019-07-03 DIAGNOSIS — N2581 Secondary hyperparathyroidism of renal origin: Secondary | ICD-10-CM | POA: Diagnosis not present

## 2019-07-03 DIAGNOSIS — D631 Anemia in chronic kidney disease: Secondary | ICD-10-CM | POA: Diagnosis not present

## 2019-07-03 DIAGNOSIS — F419 Anxiety disorder, unspecified: Secondary | ICD-10-CM | POA: Diagnosis not present

## 2019-07-03 DIAGNOSIS — N186 End stage renal disease: Secondary | ICD-10-CM | POA: Diagnosis not present

## 2019-07-06 DIAGNOSIS — F419 Anxiety disorder, unspecified: Secondary | ICD-10-CM | POA: Diagnosis not present

## 2019-07-06 DIAGNOSIS — N186 End stage renal disease: Secondary | ICD-10-CM | POA: Diagnosis not present

## 2019-07-06 DIAGNOSIS — Z992 Dependence on renal dialysis: Secondary | ICD-10-CM | POA: Diagnosis not present

## 2019-07-06 DIAGNOSIS — D631 Anemia in chronic kidney disease: Secondary | ICD-10-CM | POA: Diagnosis not present

## 2019-07-06 DIAGNOSIS — N2581 Secondary hyperparathyroidism of renal origin: Secondary | ICD-10-CM | POA: Diagnosis not present

## 2019-07-07 DIAGNOSIS — F603 Borderline personality disorder: Secondary | ICD-10-CM | POA: Diagnosis not present

## 2019-07-07 DIAGNOSIS — F330 Major depressive disorder, recurrent, mild: Secondary | ICD-10-CM | POA: Diagnosis not present

## 2019-07-07 DIAGNOSIS — F411 Generalized anxiety disorder: Secondary | ICD-10-CM | POA: Diagnosis not present

## 2019-07-08 DIAGNOSIS — D631 Anemia in chronic kidney disease: Secondary | ICD-10-CM | POA: Diagnosis not present

## 2019-07-08 DIAGNOSIS — F419 Anxiety disorder, unspecified: Secondary | ICD-10-CM | POA: Diagnosis not present

## 2019-07-08 DIAGNOSIS — N2581 Secondary hyperparathyroidism of renal origin: Secondary | ICD-10-CM | POA: Diagnosis not present

## 2019-07-08 DIAGNOSIS — N186 End stage renal disease: Secondary | ICD-10-CM | POA: Diagnosis not present

## 2019-07-08 DIAGNOSIS — Z992 Dependence on renal dialysis: Secondary | ICD-10-CM | POA: Diagnosis not present

## 2019-07-09 DIAGNOSIS — F331 Major depressive disorder, recurrent, moderate: Secondary | ICD-10-CM | POA: Diagnosis not present

## 2019-07-10 DIAGNOSIS — D631 Anemia in chronic kidney disease: Secondary | ICD-10-CM | POA: Diagnosis not present

## 2019-07-10 DIAGNOSIS — Z992 Dependence on renal dialysis: Secondary | ICD-10-CM | POA: Diagnosis not present

## 2019-07-10 DIAGNOSIS — N186 End stage renal disease: Secondary | ICD-10-CM | POA: Diagnosis not present

## 2019-07-10 DIAGNOSIS — Z23 Encounter for immunization: Secondary | ICD-10-CM | POA: Diagnosis not present

## 2019-07-10 DIAGNOSIS — N2581 Secondary hyperparathyroidism of renal origin: Secondary | ICD-10-CM | POA: Diagnosis not present

## 2019-07-13 DIAGNOSIS — N2581 Secondary hyperparathyroidism of renal origin: Secondary | ICD-10-CM | POA: Diagnosis not present

## 2019-07-13 DIAGNOSIS — D631 Anemia in chronic kidney disease: Secondary | ICD-10-CM | POA: Diagnosis not present

## 2019-07-13 DIAGNOSIS — Z992 Dependence on renal dialysis: Secondary | ICD-10-CM | POA: Diagnosis not present

## 2019-07-13 DIAGNOSIS — Z23 Encounter for immunization: Secondary | ICD-10-CM | POA: Diagnosis not present

## 2019-07-13 DIAGNOSIS — N186 End stage renal disease: Secondary | ICD-10-CM | POA: Diagnosis not present

## 2019-07-14 DIAGNOSIS — I10 Essential (primary) hypertension: Secondary | ICD-10-CM | POA: Diagnosis not present

## 2019-07-14 DIAGNOSIS — E119 Type 2 diabetes mellitus without complications: Secondary | ICD-10-CM | POA: Diagnosis not present

## 2019-07-14 DIAGNOSIS — N19 Unspecified kidney failure: Secondary | ICD-10-CM | POA: Diagnosis not present

## 2019-07-15 DIAGNOSIS — Z992 Dependence on renal dialysis: Secondary | ICD-10-CM | POA: Diagnosis not present

## 2019-07-15 DIAGNOSIS — N2581 Secondary hyperparathyroidism of renal origin: Secondary | ICD-10-CM | POA: Diagnosis not present

## 2019-07-15 DIAGNOSIS — Z23 Encounter for immunization: Secondary | ICD-10-CM | POA: Diagnosis not present

## 2019-07-15 DIAGNOSIS — D631 Anemia in chronic kidney disease: Secondary | ICD-10-CM | POA: Diagnosis not present

## 2019-07-15 DIAGNOSIS — N186 End stage renal disease: Secondary | ICD-10-CM | POA: Diagnosis not present

## 2019-07-17 DIAGNOSIS — Z23 Encounter for immunization: Secondary | ICD-10-CM | POA: Diagnosis not present

## 2019-07-17 DIAGNOSIS — Z992 Dependence on renal dialysis: Secondary | ICD-10-CM | POA: Diagnosis not present

## 2019-07-17 DIAGNOSIS — D631 Anemia in chronic kidney disease: Secondary | ICD-10-CM | POA: Diagnosis not present

## 2019-07-17 DIAGNOSIS — N186 End stage renal disease: Secondary | ICD-10-CM | POA: Diagnosis not present

## 2019-07-17 DIAGNOSIS — N2581 Secondary hyperparathyroidism of renal origin: Secondary | ICD-10-CM | POA: Diagnosis not present

## 2019-07-20 DIAGNOSIS — N2581 Secondary hyperparathyroidism of renal origin: Secondary | ICD-10-CM | POA: Diagnosis not present

## 2019-07-20 DIAGNOSIS — N186 End stage renal disease: Secondary | ICD-10-CM | POA: Diagnosis not present

## 2019-07-20 DIAGNOSIS — D631 Anemia in chronic kidney disease: Secondary | ICD-10-CM | POA: Diagnosis not present

## 2019-07-20 DIAGNOSIS — Z23 Encounter for immunization: Secondary | ICD-10-CM | POA: Diagnosis not present

## 2019-07-20 DIAGNOSIS — Z992 Dependence on renal dialysis: Secondary | ICD-10-CM | POA: Diagnosis not present

## 2019-07-22 DIAGNOSIS — N2581 Secondary hyperparathyroidism of renal origin: Secondary | ICD-10-CM | POA: Diagnosis not present

## 2019-07-22 DIAGNOSIS — N186 End stage renal disease: Secondary | ICD-10-CM | POA: Diagnosis not present

## 2019-07-22 DIAGNOSIS — Z23 Encounter for immunization: Secondary | ICD-10-CM | POA: Diagnosis not present

## 2019-07-22 DIAGNOSIS — D631 Anemia in chronic kidney disease: Secondary | ICD-10-CM | POA: Diagnosis not present

## 2019-07-22 DIAGNOSIS — Z992 Dependence on renal dialysis: Secondary | ICD-10-CM | POA: Diagnosis not present

## 2019-07-24 DIAGNOSIS — Z992 Dependence on renal dialysis: Secondary | ICD-10-CM | POA: Diagnosis not present

## 2019-07-24 DIAGNOSIS — N2581 Secondary hyperparathyroidism of renal origin: Secondary | ICD-10-CM | POA: Diagnosis not present

## 2019-07-24 DIAGNOSIS — Z23 Encounter for immunization: Secondary | ICD-10-CM | POA: Diagnosis not present

## 2019-07-24 DIAGNOSIS — D631 Anemia in chronic kidney disease: Secondary | ICD-10-CM | POA: Diagnosis not present

## 2019-07-24 DIAGNOSIS — N186 End stage renal disease: Secondary | ICD-10-CM | POA: Diagnosis not present

## 2019-07-27 DIAGNOSIS — Z992 Dependence on renal dialysis: Secondary | ICD-10-CM | POA: Diagnosis not present

## 2019-07-27 DIAGNOSIS — Z23 Encounter for immunization: Secondary | ICD-10-CM | POA: Diagnosis not present

## 2019-07-27 DIAGNOSIS — D631 Anemia in chronic kidney disease: Secondary | ICD-10-CM | POA: Diagnosis not present

## 2019-07-27 DIAGNOSIS — N186 End stage renal disease: Secondary | ICD-10-CM | POA: Diagnosis not present

## 2019-07-27 DIAGNOSIS — N2581 Secondary hyperparathyroidism of renal origin: Secondary | ICD-10-CM | POA: Diagnosis not present

## 2019-07-29 DIAGNOSIS — D631 Anemia in chronic kidney disease: Secondary | ICD-10-CM | POA: Diagnosis not present

## 2019-07-29 DIAGNOSIS — Z23 Encounter for immunization: Secondary | ICD-10-CM | POA: Diagnosis not present

## 2019-07-29 DIAGNOSIS — Z992 Dependence on renal dialysis: Secondary | ICD-10-CM | POA: Diagnosis not present

## 2019-07-29 DIAGNOSIS — N186 End stage renal disease: Secondary | ICD-10-CM | POA: Diagnosis not present

## 2019-07-29 DIAGNOSIS — N2581 Secondary hyperparathyroidism of renal origin: Secondary | ICD-10-CM | POA: Diagnosis not present

## 2019-07-31 DIAGNOSIS — Z23 Encounter for immunization: Secondary | ICD-10-CM | POA: Diagnosis not present

## 2019-07-31 DIAGNOSIS — N186 End stage renal disease: Secondary | ICD-10-CM | POA: Diagnosis not present

## 2019-07-31 DIAGNOSIS — Z992 Dependence on renal dialysis: Secondary | ICD-10-CM | POA: Diagnosis not present

## 2019-07-31 DIAGNOSIS — D631 Anemia in chronic kidney disease: Secondary | ICD-10-CM | POA: Diagnosis not present

## 2019-07-31 DIAGNOSIS — N2581 Secondary hyperparathyroidism of renal origin: Secondary | ICD-10-CM | POA: Diagnosis not present

## 2019-08-03 DIAGNOSIS — Z992 Dependence on renal dialysis: Secondary | ICD-10-CM | POA: Diagnosis not present

## 2019-08-03 DIAGNOSIS — Z23 Encounter for immunization: Secondary | ICD-10-CM | POA: Diagnosis not present

## 2019-08-03 DIAGNOSIS — N2581 Secondary hyperparathyroidism of renal origin: Secondary | ICD-10-CM | POA: Diagnosis not present

## 2019-08-03 DIAGNOSIS — N186 End stage renal disease: Secondary | ICD-10-CM | POA: Diagnosis not present

## 2019-08-03 DIAGNOSIS — D631 Anemia in chronic kidney disease: Secondary | ICD-10-CM | POA: Diagnosis not present

## 2019-08-05 DIAGNOSIS — Z23 Encounter for immunization: Secondary | ICD-10-CM | POA: Diagnosis not present

## 2019-08-05 DIAGNOSIS — D631 Anemia in chronic kidney disease: Secondary | ICD-10-CM | POA: Diagnosis not present

## 2019-08-05 DIAGNOSIS — N186 End stage renal disease: Secondary | ICD-10-CM | POA: Diagnosis not present

## 2019-08-05 DIAGNOSIS — N2581 Secondary hyperparathyroidism of renal origin: Secondary | ICD-10-CM | POA: Diagnosis not present

## 2019-08-05 DIAGNOSIS — Z992 Dependence on renal dialysis: Secondary | ICD-10-CM | POA: Diagnosis not present

## 2019-08-06 DIAGNOSIS — F331 Major depressive disorder, recurrent, moderate: Secondary | ICD-10-CM | POA: Diagnosis not present

## 2019-08-07 DIAGNOSIS — N186 End stage renal disease: Secondary | ICD-10-CM | POA: Diagnosis not present

## 2019-08-07 DIAGNOSIS — Z23 Encounter for immunization: Secondary | ICD-10-CM | POA: Diagnosis not present

## 2019-08-07 DIAGNOSIS — D631 Anemia in chronic kidney disease: Secondary | ICD-10-CM | POA: Diagnosis not present

## 2019-08-07 DIAGNOSIS — Z992 Dependence on renal dialysis: Secondary | ICD-10-CM | POA: Diagnosis not present

## 2019-08-07 DIAGNOSIS — N2581 Secondary hyperparathyroidism of renal origin: Secondary | ICD-10-CM | POA: Diagnosis not present

## 2019-08-10 DIAGNOSIS — N186 End stage renal disease: Secondary | ICD-10-CM | POA: Diagnosis not present

## 2019-08-10 DIAGNOSIS — N2581 Secondary hyperparathyroidism of renal origin: Secondary | ICD-10-CM | POA: Diagnosis not present

## 2019-08-10 DIAGNOSIS — D631 Anemia in chronic kidney disease: Secondary | ICD-10-CM | POA: Diagnosis not present

## 2019-08-10 DIAGNOSIS — D529 Folate deficiency anemia, unspecified: Secondary | ICD-10-CM | POA: Diagnosis not present

## 2019-08-10 DIAGNOSIS — Z992 Dependence on renal dialysis: Secondary | ICD-10-CM | POA: Diagnosis not present

## 2019-08-12 DIAGNOSIS — N186 End stage renal disease: Secondary | ICD-10-CM | POA: Diagnosis not present

## 2019-08-12 DIAGNOSIS — N2581 Secondary hyperparathyroidism of renal origin: Secondary | ICD-10-CM | POA: Diagnosis not present

## 2019-08-12 DIAGNOSIS — Z992 Dependence on renal dialysis: Secondary | ICD-10-CM | POA: Diagnosis not present

## 2019-08-12 DIAGNOSIS — D529 Folate deficiency anemia, unspecified: Secondary | ICD-10-CM | POA: Diagnosis not present

## 2019-08-12 DIAGNOSIS — D631 Anemia in chronic kidney disease: Secondary | ICD-10-CM | POA: Diagnosis not present

## 2019-08-14 DIAGNOSIS — N186 End stage renal disease: Secondary | ICD-10-CM | POA: Diagnosis not present

## 2019-08-14 DIAGNOSIS — D631 Anemia in chronic kidney disease: Secondary | ICD-10-CM | POA: Diagnosis not present

## 2019-08-14 DIAGNOSIS — D529 Folate deficiency anemia, unspecified: Secondary | ICD-10-CM | POA: Diagnosis not present

## 2019-08-14 DIAGNOSIS — Z992 Dependence on renal dialysis: Secondary | ICD-10-CM | POA: Diagnosis not present

## 2019-08-14 DIAGNOSIS — N2581 Secondary hyperparathyroidism of renal origin: Secondary | ICD-10-CM | POA: Diagnosis not present

## 2019-08-17 DIAGNOSIS — D631 Anemia in chronic kidney disease: Secondary | ICD-10-CM | POA: Diagnosis not present

## 2019-08-17 DIAGNOSIS — Z992 Dependence on renal dialysis: Secondary | ICD-10-CM | POA: Diagnosis not present

## 2019-08-17 DIAGNOSIS — N186 End stage renal disease: Secondary | ICD-10-CM | POA: Diagnosis not present

## 2019-08-17 DIAGNOSIS — N2581 Secondary hyperparathyroidism of renal origin: Secondary | ICD-10-CM | POA: Diagnosis not present

## 2019-08-17 DIAGNOSIS — D529 Folate deficiency anemia, unspecified: Secondary | ICD-10-CM | POA: Diagnosis not present

## 2019-08-19 DIAGNOSIS — D631 Anemia in chronic kidney disease: Secondary | ICD-10-CM | POA: Diagnosis not present

## 2019-08-19 DIAGNOSIS — D529 Folate deficiency anemia, unspecified: Secondary | ICD-10-CM | POA: Diagnosis not present

## 2019-08-19 DIAGNOSIS — Z992 Dependence on renal dialysis: Secondary | ICD-10-CM | POA: Diagnosis not present

## 2019-08-19 DIAGNOSIS — N2581 Secondary hyperparathyroidism of renal origin: Secondary | ICD-10-CM | POA: Diagnosis not present

## 2019-08-19 DIAGNOSIS — N186 End stage renal disease: Secondary | ICD-10-CM | POA: Diagnosis not present

## 2019-08-21 DIAGNOSIS — D631 Anemia in chronic kidney disease: Secondary | ICD-10-CM | POA: Diagnosis not present

## 2019-08-21 DIAGNOSIS — Z992 Dependence on renal dialysis: Secondary | ICD-10-CM | POA: Diagnosis not present

## 2019-08-21 DIAGNOSIS — N2581 Secondary hyperparathyroidism of renal origin: Secondary | ICD-10-CM | POA: Diagnosis not present

## 2019-08-21 DIAGNOSIS — N186 End stage renal disease: Secondary | ICD-10-CM | POA: Diagnosis not present

## 2019-08-21 DIAGNOSIS — D529 Folate deficiency anemia, unspecified: Secondary | ICD-10-CM | POA: Diagnosis not present

## 2019-08-24 DIAGNOSIS — D529 Folate deficiency anemia, unspecified: Secondary | ICD-10-CM | POA: Diagnosis not present

## 2019-08-24 DIAGNOSIS — N186 End stage renal disease: Secondary | ICD-10-CM | POA: Diagnosis not present

## 2019-08-24 DIAGNOSIS — Z992 Dependence on renal dialysis: Secondary | ICD-10-CM | POA: Diagnosis not present

## 2019-08-24 DIAGNOSIS — D631 Anemia in chronic kidney disease: Secondary | ICD-10-CM | POA: Diagnosis not present

## 2019-08-24 DIAGNOSIS — N2581 Secondary hyperparathyroidism of renal origin: Secondary | ICD-10-CM | POA: Diagnosis not present

## 2019-08-26 DIAGNOSIS — D529 Folate deficiency anemia, unspecified: Secondary | ICD-10-CM | POA: Diagnosis not present

## 2019-08-26 DIAGNOSIS — N186 End stage renal disease: Secondary | ICD-10-CM | POA: Diagnosis not present

## 2019-08-26 DIAGNOSIS — Z992 Dependence on renal dialysis: Secondary | ICD-10-CM | POA: Diagnosis not present

## 2019-08-26 DIAGNOSIS — N2581 Secondary hyperparathyroidism of renal origin: Secondary | ICD-10-CM | POA: Diagnosis not present

## 2019-08-26 DIAGNOSIS — D631 Anemia in chronic kidney disease: Secondary | ICD-10-CM | POA: Diagnosis not present

## 2019-08-28 DIAGNOSIS — N2581 Secondary hyperparathyroidism of renal origin: Secondary | ICD-10-CM | POA: Diagnosis not present

## 2019-08-28 DIAGNOSIS — Z992 Dependence on renal dialysis: Secondary | ICD-10-CM | POA: Diagnosis not present

## 2019-08-28 DIAGNOSIS — D631 Anemia in chronic kidney disease: Secondary | ICD-10-CM | POA: Diagnosis not present

## 2019-08-28 DIAGNOSIS — D529 Folate deficiency anemia, unspecified: Secondary | ICD-10-CM | POA: Diagnosis not present

## 2019-08-28 DIAGNOSIS — N186 End stage renal disease: Secondary | ICD-10-CM | POA: Diagnosis not present

## 2019-08-31 DIAGNOSIS — N186 End stage renal disease: Secondary | ICD-10-CM | POA: Diagnosis not present

## 2019-08-31 DIAGNOSIS — D529 Folate deficiency anemia, unspecified: Secondary | ICD-10-CM | POA: Diagnosis not present

## 2019-08-31 DIAGNOSIS — Z992 Dependence on renal dialysis: Secondary | ICD-10-CM | POA: Diagnosis not present

## 2019-08-31 DIAGNOSIS — N2581 Secondary hyperparathyroidism of renal origin: Secondary | ICD-10-CM | POA: Diagnosis not present

## 2019-08-31 DIAGNOSIS — E119 Type 2 diabetes mellitus without complications: Secondary | ICD-10-CM | POA: Diagnosis not present

## 2019-08-31 DIAGNOSIS — D631 Anemia in chronic kidney disease: Secondary | ICD-10-CM | POA: Diagnosis not present

## 2019-09-02 DIAGNOSIS — Z992 Dependence on renal dialysis: Secondary | ICD-10-CM | POA: Diagnosis not present

## 2019-09-02 DIAGNOSIS — N2581 Secondary hyperparathyroidism of renal origin: Secondary | ICD-10-CM | POA: Diagnosis not present

## 2019-09-02 DIAGNOSIS — D529 Folate deficiency anemia, unspecified: Secondary | ICD-10-CM | POA: Diagnosis not present

## 2019-09-02 DIAGNOSIS — N186 End stage renal disease: Secondary | ICD-10-CM | POA: Diagnosis not present

## 2019-09-02 DIAGNOSIS — D631 Anemia in chronic kidney disease: Secondary | ICD-10-CM | POA: Diagnosis not present

## 2019-09-04 DIAGNOSIS — D631 Anemia in chronic kidney disease: Secondary | ICD-10-CM | POA: Diagnosis not present

## 2019-09-04 DIAGNOSIS — D529 Folate deficiency anemia, unspecified: Secondary | ICD-10-CM | POA: Diagnosis not present

## 2019-09-04 DIAGNOSIS — N2581 Secondary hyperparathyroidism of renal origin: Secondary | ICD-10-CM | POA: Diagnosis not present

## 2019-09-04 DIAGNOSIS — N186 End stage renal disease: Secondary | ICD-10-CM | POA: Diagnosis not present

## 2019-09-04 DIAGNOSIS — Z992 Dependence on renal dialysis: Secondary | ICD-10-CM | POA: Diagnosis not present

## 2019-09-07 DIAGNOSIS — D631 Anemia in chronic kidney disease: Secondary | ICD-10-CM | POA: Diagnosis not present

## 2019-09-07 DIAGNOSIS — D529 Folate deficiency anemia, unspecified: Secondary | ICD-10-CM | POA: Diagnosis not present

## 2019-09-07 DIAGNOSIS — Z992 Dependence on renal dialysis: Secondary | ICD-10-CM | POA: Diagnosis not present

## 2019-09-07 DIAGNOSIS — N2581 Secondary hyperparathyroidism of renal origin: Secondary | ICD-10-CM | POA: Diagnosis not present

## 2019-09-07 DIAGNOSIS — N186 End stage renal disease: Secondary | ICD-10-CM | POA: Diagnosis not present

## 2019-09-09 DIAGNOSIS — D631 Anemia in chronic kidney disease: Secondary | ICD-10-CM | POA: Diagnosis not present

## 2019-09-09 DIAGNOSIS — Z992 Dependence on renal dialysis: Secondary | ICD-10-CM | POA: Diagnosis not present

## 2019-09-09 DIAGNOSIS — N186 End stage renal disease: Secondary | ICD-10-CM | POA: Diagnosis not present

## 2019-09-09 DIAGNOSIS — N2581 Secondary hyperparathyroidism of renal origin: Secondary | ICD-10-CM | POA: Diagnosis not present

## 2019-09-11 DIAGNOSIS — N186 End stage renal disease: Secondary | ICD-10-CM | POA: Diagnosis not present

## 2019-09-11 DIAGNOSIS — Z992 Dependence on renal dialysis: Secondary | ICD-10-CM | POA: Diagnosis not present

## 2019-09-11 DIAGNOSIS — D631 Anemia in chronic kidney disease: Secondary | ICD-10-CM | POA: Diagnosis not present

## 2019-09-11 DIAGNOSIS — N2581 Secondary hyperparathyroidism of renal origin: Secondary | ICD-10-CM | POA: Diagnosis not present

## 2019-09-14 DIAGNOSIS — Z992 Dependence on renal dialysis: Secondary | ICD-10-CM | POA: Diagnosis not present

## 2019-09-14 DIAGNOSIS — N186 End stage renal disease: Secondary | ICD-10-CM | POA: Diagnosis not present

## 2019-09-14 DIAGNOSIS — D631 Anemia in chronic kidney disease: Secondary | ICD-10-CM | POA: Diagnosis not present

## 2019-09-14 DIAGNOSIS — N2581 Secondary hyperparathyroidism of renal origin: Secondary | ICD-10-CM | POA: Diagnosis not present

## 2019-09-16 DIAGNOSIS — N2581 Secondary hyperparathyroidism of renal origin: Secondary | ICD-10-CM | POA: Diagnosis not present

## 2019-09-16 DIAGNOSIS — N186 End stage renal disease: Secondary | ICD-10-CM | POA: Diagnosis not present

## 2019-09-16 DIAGNOSIS — D631 Anemia in chronic kidney disease: Secondary | ICD-10-CM | POA: Diagnosis not present

## 2019-09-16 DIAGNOSIS — Z992 Dependence on renal dialysis: Secondary | ICD-10-CM | POA: Diagnosis not present

## 2019-09-18 DIAGNOSIS — N186 End stage renal disease: Secondary | ICD-10-CM | POA: Diagnosis not present

## 2019-09-18 DIAGNOSIS — Z992 Dependence on renal dialysis: Secondary | ICD-10-CM | POA: Diagnosis not present

## 2019-09-18 DIAGNOSIS — N2581 Secondary hyperparathyroidism of renal origin: Secondary | ICD-10-CM | POA: Diagnosis not present

## 2019-09-18 DIAGNOSIS — D631 Anemia in chronic kidney disease: Secondary | ICD-10-CM | POA: Diagnosis not present

## 2019-09-21 DIAGNOSIS — Z992 Dependence on renal dialysis: Secondary | ICD-10-CM | POA: Diagnosis not present

## 2019-09-21 DIAGNOSIS — D631 Anemia in chronic kidney disease: Secondary | ICD-10-CM | POA: Diagnosis not present

## 2019-09-21 DIAGNOSIS — N186 End stage renal disease: Secondary | ICD-10-CM | POA: Diagnosis not present

## 2019-09-21 DIAGNOSIS — N2581 Secondary hyperparathyroidism of renal origin: Secondary | ICD-10-CM | POA: Diagnosis not present

## 2019-09-23 DIAGNOSIS — Z992 Dependence on renal dialysis: Secondary | ICD-10-CM | POA: Diagnosis not present

## 2019-09-23 DIAGNOSIS — N186 End stage renal disease: Secondary | ICD-10-CM | POA: Diagnosis not present

## 2019-09-23 DIAGNOSIS — N2581 Secondary hyperparathyroidism of renal origin: Secondary | ICD-10-CM | POA: Diagnosis not present

## 2019-09-23 DIAGNOSIS — D631 Anemia in chronic kidney disease: Secondary | ICD-10-CM | POA: Diagnosis not present

## 2019-09-27 DIAGNOSIS — D631 Anemia in chronic kidney disease: Secondary | ICD-10-CM | POA: Diagnosis not present

## 2019-09-27 DIAGNOSIS — Z992 Dependence on renal dialysis: Secondary | ICD-10-CM | POA: Diagnosis not present

## 2019-09-27 DIAGNOSIS — N2581 Secondary hyperparathyroidism of renal origin: Secondary | ICD-10-CM | POA: Diagnosis not present

## 2019-09-27 DIAGNOSIS — N186 End stage renal disease: Secondary | ICD-10-CM | POA: Diagnosis not present

## 2019-09-27 DIAGNOSIS — N189 Chronic kidney disease, unspecified: Secondary | ICD-10-CM | POA: Diagnosis not present

## 2019-09-29 DIAGNOSIS — D631 Anemia in chronic kidney disease: Secondary | ICD-10-CM | POA: Diagnosis not present

## 2019-09-29 DIAGNOSIS — N186 End stage renal disease: Secondary | ICD-10-CM | POA: Diagnosis not present

## 2019-09-29 DIAGNOSIS — Z992 Dependence on renal dialysis: Secondary | ICD-10-CM | POA: Diagnosis not present

## 2019-09-29 DIAGNOSIS — N2581 Secondary hyperparathyroidism of renal origin: Secondary | ICD-10-CM | POA: Diagnosis not present

## 2019-10-01 DIAGNOSIS — N186 End stage renal disease: Secondary | ICD-10-CM | POA: Diagnosis not present

## 2019-10-01 DIAGNOSIS — D631 Anemia in chronic kidney disease: Secondary | ICD-10-CM | POA: Diagnosis not present

## 2019-10-01 DIAGNOSIS — Z992 Dependence on renal dialysis: Secondary | ICD-10-CM | POA: Diagnosis not present

## 2019-10-01 DIAGNOSIS — N2581 Secondary hyperparathyroidism of renal origin: Secondary | ICD-10-CM | POA: Diagnosis not present

## 2019-10-04 DIAGNOSIS — D631 Anemia in chronic kidney disease: Secondary | ICD-10-CM | POA: Diagnosis not present

## 2019-10-04 DIAGNOSIS — Z992 Dependence on renal dialysis: Secondary | ICD-10-CM | POA: Diagnosis not present

## 2019-10-04 DIAGNOSIS — N186 End stage renal disease: Secondary | ICD-10-CM | POA: Diagnosis not present

## 2019-10-04 DIAGNOSIS — N2581 Secondary hyperparathyroidism of renal origin: Secondary | ICD-10-CM | POA: Diagnosis not present

## 2019-10-08 DIAGNOSIS — D631 Anemia in chronic kidney disease: Secondary | ICD-10-CM | POA: Diagnosis not present

## 2019-10-08 DIAGNOSIS — N186 End stage renal disease: Secondary | ICD-10-CM | POA: Diagnosis not present

## 2019-10-08 DIAGNOSIS — N2581 Secondary hyperparathyroidism of renal origin: Secondary | ICD-10-CM | POA: Diagnosis not present

## 2019-10-08 DIAGNOSIS — Z992 Dependence on renal dialysis: Secondary | ICD-10-CM | POA: Diagnosis not present

## 2019-10-11 DIAGNOSIS — N2581 Secondary hyperparathyroidism of renal origin: Secondary | ICD-10-CM | POA: Diagnosis not present

## 2019-10-11 DIAGNOSIS — N186 End stage renal disease: Secondary | ICD-10-CM | POA: Diagnosis not present

## 2019-10-11 DIAGNOSIS — Z992 Dependence on renal dialysis: Secondary | ICD-10-CM | POA: Diagnosis not present

## 2019-10-11 DIAGNOSIS — D631 Anemia in chronic kidney disease: Secondary | ICD-10-CM | POA: Diagnosis not present

## 2019-10-13 DIAGNOSIS — I120 Hypertensive chronic kidney disease with stage 5 chronic kidney disease or end stage renal disease: Secondary | ICD-10-CM | POA: Diagnosis not present

## 2019-10-13 DIAGNOSIS — T82858A Stenosis of vascular prosthetic devices, implants and grafts, initial encounter: Secondary | ICD-10-CM | POA: Diagnosis not present

## 2019-10-13 DIAGNOSIS — N186 End stage renal disease: Secondary | ICD-10-CM | POA: Diagnosis not present

## 2019-10-13 DIAGNOSIS — Z992 Dependence on renal dialysis: Secondary | ICD-10-CM | POA: Diagnosis not present

## 2019-10-14 DIAGNOSIS — D631 Anemia in chronic kidney disease: Secondary | ICD-10-CM | POA: Diagnosis not present

## 2019-10-14 DIAGNOSIS — I509 Heart failure, unspecified: Secondary | ICD-10-CM | POA: Diagnosis not present

## 2019-10-14 DIAGNOSIS — N2581 Secondary hyperparathyroidism of renal origin: Secondary | ICD-10-CM | POA: Diagnosis not present

## 2019-10-14 DIAGNOSIS — N186 End stage renal disease: Secondary | ICD-10-CM | POA: Diagnosis not present

## 2019-10-14 DIAGNOSIS — E119 Type 2 diabetes mellitus without complications: Secondary | ICD-10-CM | POA: Diagnosis not present

## 2019-10-14 DIAGNOSIS — Z992 Dependence on renal dialysis: Secondary | ICD-10-CM | POA: Diagnosis not present

## 2019-10-14 DIAGNOSIS — I10 Essential (primary) hypertension: Secondary | ICD-10-CM | POA: Diagnosis not present

## 2019-10-16 DIAGNOSIS — N186 End stage renal disease: Secondary | ICD-10-CM | POA: Diagnosis not present

## 2019-10-16 DIAGNOSIS — N2581 Secondary hyperparathyroidism of renal origin: Secondary | ICD-10-CM | POA: Diagnosis not present

## 2019-10-16 DIAGNOSIS — Z992 Dependence on renal dialysis: Secondary | ICD-10-CM | POA: Diagnosis not present

## 2019-10-16 DIAGNOSIS — D631 Anemia in chronic kidney disease: Secondary | ICD-10-CM | POA: Diagnosis not present

## 2019-10-19 DIAGNOSIS — D631 Anemia in chronic kidney disease: Secondary | ICD-10-CM | POA: Diagnosis not present

## 2019-10-19 DIAGNOSIS — N2581 Secondary hyperparathyroidism of renal origin: Secondary | ICD-10-CM | POA: Diagnosis not present

## 2019-10-19 DIAGNOSIS — Z992 Dependence on renal dialysis: Secondary | ICD-10-CM | POA: Diagnosis not present

## 2019-10-19 DIAGNOSIS — N186 End stage renal disease: Secondary | ICD-10-CM | POA: Diagnosis not present

## 2019-10-21 DIAGNOSIS — N2581 Secondary hyperparathyroidism of renal origin: Secondary | ICD-10-CM | POA: Diagnosis not present

## 2019-10-21 DIAGNOSIS — N186 End stage renal disease: Secondary | ICD-10-CM | POA: Diagnosis not present

## 2019-10-21 DIAGNOSIS — Z992 Dependence on renal dialysis: Secondary | ICD-10-CM | POA: Diagnosis not present

## 2019-10-21 DIAGNOSIS — D631 Anemia in chronic kidney disease: Secondary | ICD-10-CM | POA: Diagnosis not present

## 2019-10-23 DIAGNOSIS — D631 Anemia in chronic kidney disease: Secondary | ICD-10-CM | POA: Diagnosis not present

## 2019-10-23 DIAGNOSIS — Z992 Dependence on renal dialysis: Secondary | ICD-10-CM | POA: Diagnosis not present

## 2019-10-23 DIAGNOSIS — N2581 Secondary hyperparathyroidism of renal origin: Secondary | ICD-10-CM | POA: Diagnosis not present

## 2019-10-23 DIAGNOSIS — N186 End stage renal disease: Secondary | ICD-10-CM | POA: Diagnosis not present

## 2019-10-26 DIAGNOSIS — N186 End stage renal disease: Secondary | ICD-10-CM | POA: Diagnosis not present

## 2019-10-26 DIAGNOSIS — D631 Anemia in chronic kidney disease: Secondary | ICD-10-CM | POA: Diagnosis not present

## 2019-10-26 DIAGNOSIS — Z992 Dependence on renal dialysis: Secondary | ICD-10-CM | POA: Diagnosis not present

## 2019-10-26 DIAGNOSIS — N2581 Secondary hyperparathyroidism of renal origin: Secondary | ICD-10-CM | POA: Diagnosis not present

## 2019-10-28 DIAGNOSIS — N186 End stage renal disease: Secondary | ICD-10-CM | POA: Diagnosis not present

## 2019-10-28 DIAGNOSIS — Z992 Dependence on renal dialysis: Secondary | ICD-10-CM | POA: Diagnosis not present

## 2019-10-28 DIAGNOSIS — D631 Anemia in chronic kidney disease: Secondary | ICD-10-CM | POA: Diagnosis not present

## 2019-10-28 DIAGNOSIS — N2581 Secondary hyperparathyroidism of renal origin: Secondary | ICD-10-CM | POA: Diagnosis not present

## 2019-10-30 DIAGNOSIS — Z992 Dependence on renal dialysis: Secondary | ICD-10-CM | POA: Diagnosis not present

## 2019-10-30 DIAGNOSIS — D631 Anemia in chronic kidney disease: Secondary | ICD-10-CM | POA: Diagnosis not present

## 2019-10-30 DIAGNOSIS — N186 End stage renal disease: Secondary | ICD-10-CM | POA: Diagnosis not present

## 2019-10-30 DIAGNOSIS — N2581 Secondary hyperparathyroidism of renal origin: Secondary | ICD-10-CM | POA: Diagnosis not present

## 2019-11-02 DIAGNOSIS — N2581 Secondary hyperparathyroidism of renal origin: Secondary | ICD-10-CM | POA: Diagnosis not present

## 2019-11-02 DIAGNOSIS — Z992 Dependence on renal dialysis: Secondary | ICD-10-CM | POA: Diagnosis not present

## 2019-11-02 DIAGNOSIS — D631 Anemia in chronic kidney disease: Secondary | ICD-10-CM | POA: Diagnosis not present

## 2019-11-02 DIAGNOSIS — N186 End stage renal disease: Secondary | ICD-10-CM | POA: Diagnosis not present

## 2019-11-04 DIAGNOSIS — N186 End stage renal disease: Secondary | ICD-10-CM | POA: Diagnosis not present

## 2019-11-04 DIAGNOSIS — Z992 Dependence on renal dialysis: Secondary | ICD-10-CM | POA: Diagnosis not present

## 2019-11-04 DIAGNOSIS — D631 Anemia in chronic kidney disease: Secondary | ICD-10-CM | POA: Diagnosis not present

## 2019-11-04 DIAGNOSIS — N2581 Secondary hyperparathyroidism of renal origin: Secondary | ICD-10-CM | POA: Diagnosis not present

## 2019-11-06 DIAGNOSIS — N2581 Secondary hyperparathyroidism of renal origin: Secondary | ICD-10-CM | POA: Diagnosis not present

## 2019-11-06 DIAGNOSIS — N186 End stage renal disease: Secondary | ICD-10-CM | POA: Diagnosis not present

## 2019-11-06 DIAGNOSIS — Z992 Dependence on renal dialysis: Secondary | ICD-10-CM | POA: Diagnosis not present

## 2019-11-06 DIAGNOSIS — D631 Anemia in chronic kidney disease: Secondary | ICD-10-CM | POA: Diagnosis not present

## 2019-12-11 DIAGNOSIS — Z23 Encounter for immunization: Secondary | ICD-10-CM | POA: Diagnosis not present

## 2020-01-20 DIAGNOSIS — Z23 Encounter for immunization: Secondary | ICD-10-CM | POA: Diagnosis not present

## 2020-08-08 DEATH — deceased
